# Patient Record
Sex: Female | Born: 1950 | Race: Black or African American | Hispanic: No | Smoking: Current every day smoker
Health system: Southern US, Community
[De-identification: ages and names within clinical notes are randomized; demographics above are authoritative.]

## PROBLEM LIST (undated history)

## (undated) DIAGNOSIS — I739 Peripheral vascular disease, unspecified: Secondary | ICD-10-CM

## (undated) DIAGNOSIS — I10 Essential (primary) hypertension: Secondary | ICD-10-CM

## (undated) DIAGNOSIS — E785 Hyperlipidemia, unspecified: Secondary | ICD-10-CM

## (undated) DIAGNOSIS — B351 Tinea unguium: Secondary | ICD-10-CM

## (undated) DIAGNOSIS — E041 Nontoxic single thyroid nodule: Secondary | ICD-10-CM

## (undated) DIAGNOSIS — I6529 Occlusion and stenosis of unspecified carotid artery: Secondary | ICD-10-CM

## (undated) DIAGNOSIS — E119 Type 2 diabetes mellitus without complications: Secondary | ICD-10-CM

## (undated) DIAGNOSIS — I2510 Atherosclerotic heart disease of native coronary artery without angina pectoris: Secondary | ICD-10-CM

## (undated) DIAGNOSIS — D649 Anemia, unspecified: Secondary | ICD-10-CM

## (undated) DIAGNOSIS — M1990 Unspecified osteoarthritis, unspecified site: Secondary | ICD-10-CM

## (undated) DIAGNOSIS — G4730 Sleep apnea, unspecified: Secondary | ICD-10-CM

## (undated) DIAGNOSIS — Z5189 Encounter for other specified aftercare: Secondary | ICD-10-CM

## (undated) HISTORY — DX: Anemia, unspecified: D64.9

## (undated) HISTORY — DX: Essential (primary) hypertension: I10

## (undated) HISTORY — PX: CARDIAC CATHETERIZATION: SHX172

## (undated) HISTORY — DX: Tinea unguium: B35.1

## (undated) HISTORY — PX: TONSILLECTOMY: SUR1361

## (undated) HISTORY — PX: ABDOMINAL HYSTERECTOMY: SHX81

## (undated) HISTORY — DX: Atherosclerotic heart disease of native coronary artery without angina pectoris: I25.10

## (undated) HISTORY — PX: JOINT REPLACEMENT: SHX530

## (undated) HISTORY — PX: BACK SURGERY: SHX140

## (undated) HISTORY — DX: Type 2 diabetes mellitus without complications: E11.9

## (undated) HISTORY — DX: Hyperlipidemia, unspecified: E78.5

## (undated) HISTORY — DX: Encounter for other specified aftercare: Z51.89

---

## 1998-02-26 HISTORY — PX: TOTAL ABDOMINAL HYSTERECTOMY W/ BILATERAL SALPINGOOPHORECTOMY: SHX83

## 1998-03-03 ENCOUNTER — Encounter: Admission: RE | Admit: 1998-03-03 | Discharge: 1998-06-01 | Payer: Self-pay | Admitting: *Deleted

## 1998-06-21 ENCOUNTER — Ambulatory Visit (HOSPITAL_COMMUNITY): Admission: RE | Admit: 1998-06-21 | Discharge: 1998-06-21 | Payer: Self-pay

## 1998-08-03 ENCOUNTER — Ambulatory Visit (HOSPITAL_COMMUNITY): Admission: RE | Admit: 1998-08-03 | Discharge: 1998-08-03 | Payer: Self-pay | Admitting: *Deleted

## 1998-10-27 ENCOUNTER — Encounter (INDEPENDENT_AMBULATORY_CARE_PROVIDER_SITE_OTHER): Payer: Self-pay | Admitting: Specialist

## 1998-10-27 ENCOUNTER — Other Ambulatory Visit: Admission: RE | Admit: 1998-10-27 | Discharge: 1998-10-27 | Payer: Self-pay | Admitting: Obstetrics and Gynecology

## 1999-01-09 ENCOUNTER — Encounter (INDEPENDENT_AMBULATORY_CARE_PROVIDER_SITE_OTHER): Payer: Self-pay | Admitting: Specialist

## 1999-01-09 ENCOUNTER — Inpatient Hospital Stay (HOSPITAL_COMMUNITY): Admission: RE | Admit: 1999-01-09 | Discharge: 1999-01-11 | Payer: Self-pay | Admitting: Obstetrics and Gynecology

## 2000-06-11 ENCOUNTER — Ambulatory Visit (HOSPITAL_COMMUNITY): Admission: RE | Admit: 2000-06-11 | Discharge: 2000-06-11 | Payer: Self-pay | Admitting: Family Medicine

## 2001-12-23 ENCOUNTER — Ambulatory Visit (HOSPITAL_COMMUNITY): Admission: RE | Admit: 2001-12-23 | Discharge: 2001-12-23 | Payer: Self-pay | Admitting: Family Medicine

## 2002-02-26 HISTORY — PX: OTHER SURGICAL HISTORY: SHX169

## 2003-06-10 ENCOUNTER — Encounter: Admission: RE | Admit: 2003-06-10 | Discharge: 2003-06-10 | Payer: Self-pay | Admitting: Family Medicine

## 2003-11-15 ENCOUNTER — Ambulatory Visit: Payer: Self-pay | Admitting: Family Medicine

## 2003-11-15 ENCOUNTER — Ambulatory Visit: Payer: Self-pay | Admitting: *Deleted

## 2003-12-06 ENCOUNTER — Ambulatory Visit: Payer: Self-pay | Admitting: Family Medicine

## 2004-03-10 ENCOUNTER — Ambulatory Visit: Payer: Self-pay | Admitting: Family Medicine

## 2004-03-10 ENCOUNTER — Ambulatory Visit (HOSPITAL_COMMUNITY): Admission: RE | Admit: 2004-03-10 | Discharge: 2004-03-10 | Payer: Self-pay | Admitting: Family Medicine

## 2004-07-14 ENCOUNTER — Ambulatory Visit: Payer: Self-pay | Admitting: Family Medicine

## 2004-07-14 ENCOUNTER — Ambulatory Visit (HOSPITAL_COMMUNITY): Admission: RE | Admit: 2004-07-14 | Discharge: 2004-07-14 | Payer: Self-pay | Admitting: Family Medicine

## 2004-07-28 ENCOUNTER — Ambulatory Visit (HOSPITAL_COMMUNITY): Admission: RE | Admit: 2004-07-28 | Discharge: 2004-07-28 | Payer: Self-pay | Admitting: Family Medicine

## 2004-08-14 ENCOUNTER — Ambulatory Visit: Payer: Self-pay | Admitting: Family Medicine

## 2004-08-24 ENCOUNTER — Ambulatory Visit: Payer: Self-pay | Admitting: Family Medicine

## 2005-01-11 ENCOUNTER — Ambulatory Visit: Payer: Self-pay | Admitting: Family Medicine

## 2005-01-12 ENCOUNTER — Ambulatory Visit (HOSPITAL_COMMUNITY): Admission: RE | Admit: 2005-01-12 | Discharge: 2005-01-12 | Payer: Self-pay | Admitting: Family Medicine

## 2005-02-13 ENCOUNTER — Ambulatory Visit: Payer: Self-pay | Admitting: Family Medicine

## 2005-03-09 ENCOUNTER — Ambulatory Visit: Payer: Self-pay | Admitting: Family Medicine

## 2005-04-10 ENCOUNTER — Ambulatory Visit: Payer: Self-pay | Admitting: Family Medicine

## 2005-05-16 ENCOUNTER — Ambulatory Visit: Payer: Self-pay | Admitting: Family Medicine

## 2005-07-13 ENCOUNTER — Ambulatory Visit: Payer: Self-pay | Admitting: Family Medicine

## 2005-10-23 ENCOUNTER — Ambulatory Visit: Payer: Self-pay | Admitting: Family Medicine

## 2005-10-25 ENCOUNTER — Ambulatory Visit (HOSPITAL_COMMUNITY): Admission: RE | Admit: 2005-10-25 | Discharge: 2005-10-25 | Payer: Self-pay | Admitting: Family Medicine

## 2006-02-26 HISTORY — PX: VARICOSE VEIN SURGERY: SHX832

## 2006-11-04 ENCOUNTER — Ambulatory Visit (HOSPITAL_COMMUNITY): Admission: RE | Admit: 2006-11-04 | Discharge: 2006-11-04 | Payer: Self-pay | Admitting: Internal Medicine

## 2006-11-13 ENCOUNTER — Encounter (INDEPENDENT_AMBULATORY_CARE_PROVIDER_SITE_OTHER): Payer: Self-pay | Admitting: *Deleted

## 2007-04-16 ENCOUNTER — Encounter: Payer: Self-pay | Admitting: Family Medicine

## 2007-06-27 LAB — HM COLONOSCOPY

## 2007-11-12 ENCOUNTER — Ambulatory Visit (HOSPITAL_COMMUNITY): Admission: RE | Admit: 2007-11-12 | Discharge: 2007-11-12 | Payer: Self-pay | Admitting: Internal Medicine

## 2007-12-11 ENCOUNTER — Encounter: Admission: RE | Admit: 2007-12-11 | Discharge: 2007-12-11 | Payer: Self-pay | Admitting: Internal Medicine

## 2008-02-27 HISTORY — PX: EPIDURAL BLOCK INJECTION: SHX1516

## 2008-04-02 ENCOUNTER — Ambulatory Visit: Payer: Self-pay | Admitting: Family Medicine

## 2008-04-02 DIAGNOSIS — E785 Hyperlipidemia, unspecified: Secondary | ICD-10-CM

## 2008-04-02 DIAGNOSIS — E1169 Type 2 diabetes mellitus with other specified complication: Secondary | ICD-10-CM | POA: Insufficient documentation

## 2008-04-02 DIAGNOSIS — M5137 Other intervertebral disc degeneration, lumbosacral region: Secondary | ICD-10-CM | POA: Insufficient documentation

## 2008-04-02 DIAGNOSIS — E1142 Type 2 diabetes mellitus with diabetic polyneuropathy: Secondary | ICD-10-CM | POA: Insufficient documentation

## 2008-04-02 DIAGNOSIS — I10 Essential (primary) hypertension: Secondary | ICD-10-CM | POA: Insufficient documentation

## 2008-04-02 DIAGNOSIS — M51379 Other intervertebral disc degeneration, lumbosacral region without mention of lumbar back pain or lower extremity pain: Secondary | ICD-10-CM | POA: Insufficient documentation

## 2008-04-05 LAB — CONVERTED CEMR LAB
Albumin: 4 g/dL (ref 3.5–5.2)
BUN: 19 mg/dL (ref 6–23)
CO2: 23 meq/L (ref 19–32)
Calcium: 9.7 mg/dL (ref 8.4–10.5)
Chloride: 108 meq/L (ref 96–112)
Cholesterol: 194 mg/dL (ref 0–200)
Eosinophils Absolute: 0.1 10*3/uL (ref 0.0–0.7)
Glucose, Bld: 100 mg/dL — ABNORMAL HIGH (ref 70–99)
HDL: 73 mg/dL (ref >39)
Lymphs Abs: 2.8 10*3/uL (ref 0.7–4.0)
MCV: 89.4 fL (ref 78.0–100.0)
Microalb, Ur: 5.78 mg/dL — ABNORMAL HIGH (ref 0.00–1.89)
Monocytes Relative: 9 % (ref 3–12)
Neutrophils Relative %: 64 % (ref 43–77)
Potassium: 4.1 meq/L (ref 3.5–5.3)
RBC: 4.26 M/uL (ref 3.87–5.11)
Triglycerides: 49 mg/dL (ref <150)
WBC: 10.7 10*3/uL — ABNORMAL HIGH (ref 4.0–10.5)

## 2008-04-22 ENCOUNTER — Encounter (INDEPENDENT_AMBULATORY_CARE_PROVIDER_SITE_OTHER): Payer: Self-pay | Admitting: *Deleted

## 2008-05-14 ENCOUNTER — Ambulatory Visit: Payer: Self-pay | Admitting: Family Medicine

## 2008-06-28 ENCOUNTER — Ambulatory Visit: Payer: Self-pay | Admitting: Family Medicine

## 2008-06-28 LAB — CONVERTED CEMR LAB: Hgb A1c MFr Bld: 5.6 % (ref 4.6–6.5)

## 2008-07-06 ENCOUNTER — Ambulatory Visit: Payer: Self-pay | Admitting: Family Medicine

## 2008-07-19 ENCOUNTER — Ambulatory Visit: Payer: Self-pay | Admitting: Family Medicine

## 2008-07-22 LAB — CONVERTED CEMR LAB
ALT: 12 units/L (ref 0–35)
Albumin: 3.6 g/dL (ref 3.5–5.2)
Alkaline Phosphatase: 64 units/L (ref 39–117)
CO2: 26 meq/L (ref 19–32)
Calcium: 9.2 mg/dL (ref 8.4–10.5)
Chloride: 108 meq/L (ref 96–112)
Creatinine, Ser: 1.2 mg/dL (ref 0.4–1.2)
Glucose, Bld: 88 mg/dL (ref 70–99)
HDL: 46.7 mg/dL (ref >39.00)
Total Protein: 7.3 g/dL (ref 6.0–8.3)
Triglycerides: 77 mg/dL (ref 0.0–149.0)

## 2008-10-04 ENCOUNTER — Ambulatory Visit: Payer: Self-pay | Admitting: Family Medicine

## 2008-10-04 LAB — CONVERTED CEMR LAB
ALT: 19 units/L (ref 0–35)
BUN: 24 mg/dL — ABNORMAL HIGH (ref 6–23)
Chloride: 113 meq/L — ABNORMAL HIGH (ref 96–112)
GFR calc non Af Amer: 83.19 mL/min (ref >60)
HDL: 56.2 mg/dL (ref >39.00)
Hgb A1c MFr Bld: 5.8 % (ref 4.6–6.5)
Potassium: 4.6 meq/L (ref 3.5–5.1)
Sodium: 145 meq/L (ref 135–145)
Triglycerides: 53 mg/dL (ref 0.0–149.0)

## 2008-10-22 ENCOUNTER — Ambulatory Visit: Payer: Self-pay | Admitting: Family Medicine

## 2008-10-22 LAB — CONVERTED CEMR LAB
HDL goal, serum: 40 mg/dL
LDL Goal: 100 mg/dL

## 2008-11-15 ENCOUNTER — Ambulatory Visit (HOSPITAL_COMMUNITY): Admission: RE | Admit: 2008-11-15 | Discharge: 2008-11-15 | Payer: Self-pay | Admitting: Family Medicine

## 2008-11-17 ENCOUNTER — Encounter (INDEPENDENT_AMBULATORY_CARE_PROVIDER_SITE_OTHER): Payer: Self-pay | Admitting: *Deleted

## 2009-01-17 ENCOUNTER — Ambulatory Visit: Payer: Self-pay | Admitting: Family Medicine

## 2009-01-18 LAB — CONVERTED CEMR LAB
Alkaline Phosphatase: 53 units/L (ref 39–117)
Bilirubin, Direct: 0.1 mg/dL (ref 0.0–0.3)
CO2: 27 meq/L (ref 19–32)
Calcium: 8.6 mg/dL (ref 8.4–10.5)
Creatinine, Ser: 1.1 mg/dL (ref 0.4–1.2)
GFR calc non Af Amer: 65.93 mL/min (ref >60)
Sodium: 142 meq/L (ref 135–145)
Total Bilirubin: 0.8 mg/dL (ref 0.3–1.2)
Total Protein: 6.9 g/dL (ref 6.0–8.3)

## 2009-01-28 ENCOUNTER — Ambulatory Visit: Payer: Self-pay | Admitting: Family Medicine

## 2009-04-25 ENCOUNTER — Ambulatory Visit: Payer: Self-pay | Admitting: Family Medicine

## 2009-04-25 LAB — CONVERTED CEMR LAB: Rapid Strep: NEGATIVE

## 2009-04-28 ENCOUNTER — Telehealth: Payer: Self-pay | Admitting: Family Medicine

## 2009-04-28 LAB — CONVERTED CEMR LAB
Basophils Relative: 1.4 % (ref 0.0–3.0)
Eosinophils Relative: 3.5 % (ref 0.0–5.0)
HCT: 32.7 % — ABNORMAL LOW (ref 36.0–46.0)
Hemoglobin: 11 g/dL — ABNORMAL LOW (ref 12.0–15.0)
Lymphs Abs: 2.2 10*3/uL (ref 0.7–4.0)
MCV: 96.1 fL (ref 78.0–100.0)
Monocytes Absolute: 0.4 10*3/uL (ref 0.1–1.0)
Monocytes Relative: 6.4 % (ref 3.0–12.0)
RBC: 3.41 M/uL — ABNORMAL LOW (ref 3.87–5.11)
Sed Rate: 29 mm/hr — ABNORMAL HIGH (ref 0–22)
WBC: 7 10*3/uL (ref 4.5–10.5)

## 2009-04-29 ENCOUNTER — Encounter: Payer: Self-pay | Admitting: Family Medicine

## 2009-06-26 LAB — HM DIABETES EYE EXAM

## 2009-07-04 ENCOUNTER — Encounter: Payer: Self-pay | Admitting: Family Medicine

## 2009-08-09 ENCOUNTER — Encounter (INDEPENDENT_AMBULATORY_CARE_PROVIDER_SITE_OTHER): Payer: Self-pay | Admitting: *Deleted

## 2009-08-09 ENCOUNTER — Ambulatory Visit: Payer: Self-pay | Admitting: Family Medicine

## 2009-08-09 LAB — CONVERTED CEMR LAB
AST: 19 units/L (ref 0–37)
Albumin: 4.1 g/dL (ref 3.5–5.2)
Alkaline Phosphatase: 54 units/L (ref 39–117)
BUN: 30 mg/dL — ABNORMAL HIGH (ref 6–23)
Calcium: 9.6 mg/dL (ref 8.4–10.5)
GFR calc non Af Amer: 63.14 mL/min (ref >60)
Glucose, Bld: 81 mg/dL (ref 70–99)
HDL: 56 mg/dL (ref >39.00)
Hgb A1c MFr Bld: 5.7 % (ref 4.6–6.5)
LDL Cholesterol: 73 mg/dL (ref 0–99)
Microalb Creat Ratio: 3.1 mg/g (ref 0.0–30.0)
Microalb, Ur: 6 mg/dL — ABNORMAL HIGH (ref 0.0–1.9)
Potassium: 4.8 meq/L (ref 3.5–5.1)
Sodium: 140 meq/L (ref 135–145)
Total Bilirubin: 0.7 mg/dL (ref 0.3–1.2)
VLDL: 10.8 mg/dL (ref 0.0–40.0)

## 2009-08-12 ENCOUNTER — Ambulatory Visit: Payer: Self-pay | Admitting: Family Medicine

## 2009-08-22 LAB — CONVERTED CEMR LAB
Basophils Absolute: 0 10*3/uL (ref 0.0–0.1)
Eosinophils Absolute: 0.3 10*3/uL (ref 0.0–0.7)
Folate: 3.4 ng/mL
Iron: 91 ug/dL (ref 42–145)
Lymphocytes Relative: 35.6 % (ref 12.0–46.0)
MCHC: 35.2 g/dL (ref 30.0–36.0)
Monocytes Relative: 6.4 % (ref 3.0–12.0)
Neutrophils Relative %: 54.4 % (ref 43.0–77.0)
RDW: 13.5 % (ref 11.5–14.6)
TSH: 0.5 microintl units/mL (ref 0.35–5.50)
Transferrin: 268.2 mg/dL (ref 212.0–360.0)
Vitamin B-12: 792 pg/mL (ref 211–911)

## 2009-11-17 ENCOUNTER — Ambulatory Visit (HOSPITAL_COMMUNITY): Admission: RE | Admit: 2009-11-17 | Discharge: 2009-11-17 | Payer: Self-pay | Admitting: Family Medicine

## 2009-11-17 LAB — HM MAMMOGRAPHY: HM Mammogram: NEGATIVE

## 2009-11-21 ENCOUNTER — Telehealth: Payer: Self-pay | Admitting: Family Medicine

## 2010-01-30 ENCOUNTER — Ambulatory Visit: Payer: Self-pay | Admitting: Family Medicine

## 2010-01-31 LAB — CONVERTED CEMR LAB
ALT: 15 units/L (ref 0–35)
AST: 24 units/L (ref 0–37)
Albumin: 3.9 g/dL (ref 3.5–5.2)
Hgb A1c MFr Bld: 5.8 % (ref 4.6–6.5)
Total Protein: 7 g/dL (ref 6.0–8.3)

## 2010-02-03 ENCOUNTER — Ambulatory Visit: Payer: Self-pay | Admitting: Family Medicine

## 2010-02-03 LAB — HM DIABETES FOOT EXAM

## 2010-02-20 ENCOUNTER — Emergency Department (HOSPITAL_COMMUNITY)
Admission: EM | Admit: 2010-02-20 | Discharge: 2010-02-20 | Payer: Self-pay | Source: Home / Self Care | Admitting: Emergency Medicine

## 2010-03-27 ENCOUNTER — Telehealth: Payer: Self-pay | Admitting: Family Medicine

## 2010-03-28 NOTE — Letter (Signed)
Summary: April Shaffer OD  April Shaffer OD   Imported By: Lanelle Bal 07/22/2009 11:23:55  _____________________________________________________________________  External Attachment:    Type:   Image     Comment:   External Document

## 2010-03-28 NOTE — Letter (Signed)
Summary: Work Dietitian at [facility] [employer]  [address] [place], [place] [address]   Phone: [phone]  Fax: [phone]    Today's Date: August 09, 2009  Name of Patient: April Shaffer  The above named patient had a medical visit today at: 8:20 am  Please take this into consideration when reviewing the time away from work/school.    Special Instructions:  [ x ] None  [  ] To be off the remainder of today, returning to the normal work / school schedule tomorrow.  [  ] To be off until the next scheduled appointment on ______________________.  [  ] Other ________________________________________________________________ ________________________________________________________________________   Sincerely yours,   Kerby Nora MD

## 2010-03-28 NOTE — Assessment & Plan Note (Signed)
Summary: ROA 6 MTHS  CYD   Vital Signs:  Patient profile:   60 year old female Height:      64 inches Weight:      139.6 pounds BMI:     24.05 Temp:     98.6 degrees F oral Pulse rate:   76 / minute Pulse rhythm:   regular BP sitting:   130 / 80  (left arm) Cuff size:   regular  Vitals Entered By: Benny Lennert CMA Duncan Dull) (August 12, 2009 12:06 PM)  History of Present Illness: Chief complaint 6 month follow up  Arthritis in hands...NSAIDs irritate her stomach soewhat...aleve not helping...would like to use topical treatment if able to.   Hypertension History:      She denies headache, chest pain, dyspnea with exertion, neurologic problems, and side effects from treatment.  Well controlled sat home .        Positive major cardiovascular risk factors include female age 55 years old or older, diabetes, hyperlipidemia, hypertension, and current tobacco user.    Lipid Management History:      Positive NCEP/ATP III risk factors include female age 7 years old or older, diabetes, current tobacco user, and hypertension.        Her compliance with the TLC diet is good.  Adjunctive measures started by the patient include aerobic exercise, omega-3 supplements, and weight reduction.       Problems Prior to Update: 1)  Sore Throat  (ICD-462) 2)  Facial Pain  (ICD-784.0) 3)  Preventive Health Care  (ICD-V70.0) 4)  Hyperlipidemia  (ICD-272.4) 5)  Degenerative Disc Disease, Lumbosacral Spine  (ICD-722.52) 6)  Hypertension  (ICD-401.9) 7)  Diabetes Mellitus, Type II  (ICD-250.00)  Current Medications (verified): 1)  Metformin Hcl 850 Mg Tabs (Metformin Hcl) .Marland Kitchen.. 1 Tab By Mouth Two Times A Day 2)  Lisinopril-Hydrochlorothiazide 20-12.5 Mg Tabs (Lisinopril-Hydrochlorothiazide) .... 2 Tab By Mouth Daily 3)  Ibuprofen 800 Mg Tabs (Ibuprofen) .Marland Kitchen.. 1 By Mouth Three Times A Day As Needed 4)  Crestor 10 Mg Tabs (Rosuvastatin Calcium) .... Take 1 Tablet By Mouth Once A Day 5)  Aleve 220 Mg Tabs  (Naproxen Sodium) .... Otc As Directed. 6)  Flonase 50 Mcg/act Susp (Fluticasone Propionate) .... 2 Sprays in Each Nostril Once Daily For 2 Weeks 7)  Lastacaft 0.25 % Soln (Alcaftadine) .... Daily 8)  Vitamin B-12 500 Mcg Tabs (Cyanocobalamin) .... One Tablet Daily 9)  Vitamin D3 5000 Unit/ml Liqd (Cholecalciferol) .... One Tablet Daily 10)  Ferrous Sulfate 325 (65 Fe) Mg Tabs (Ferrous Sulfate) .... One Tablet Daily  Allergies (verified): No Known Drug Allergies  Past History:  Past medical, surgical, family and social histories (including risk factors) reviewed, and no changes noted (except as noted below).  Past Medical History: Reviewed history from 04/02/2008 and no changes required. Diabetes mellitus, type II Hypertension Hyperlipidemia  Past Surgical History: Reviewed history from 04/02/2008 and no changes required. varicose vein stripping 2008 epidurals 02/2008 Dr. Dorthula Nettles total hysterectomy 2004 for mennorhagia  Family History: Reviewed history from 04/02/2008 and no changes required. father: ? health problems, likely DM mother: ? cancer MGM: lung cancer sister: HTN, hypothyroid brother: healthy  Social History: Reviewed history from 04/02/2008 and no changes required. Occupation: housekeeping, Primary school teacher Single 2 boys: healthy Current Smoker: 1 pack every 2-3 days Alcohol use-yes, 2 drinks a day Drug use-no Regular exercise-only at work, walking Diet: diabetic diet  Review of Systems       Hands ache, numbness at night last  year had nerve conduction...showed carpal tunnel syndrome. General:  Complains of fatigue. CV:  Denies chest pain or discomfort. Resp:  Denies shortness of breath. GI:  Denies abdominal pain. GU:  Denies dysuria.  Physical Exam  General:  Well-developed,well-nourished,in no acute distress; alert,appropriate and cooperative throughout examination Mouth:  some teeth remaining - no inflammation or lesion  seen throat clear  Neck:  No deformities, masses, or tenderness noted. Lungs:  Normal respiratory effort, chest expands symmetrically. Lungs are clear to auscultation, no crackles or wheezes. Heart:  Normal rate and regular rhythm. S1 and S2 normal without gallop, murmur, click, rub or other extra sounds. Abdomen:  soft, non-tender, no hepatomegaly, and no splenomegaly.   Pulses:  R and L posterior tibial pulses are full and equal bilaterally  Extremities:  no edema , B varicosities  Diabetes Management Exam:    Foot Exam (with socks and/or shoes not present):       Sensory-Pinprick/Light touch:          Left medial foot (L-4): normal          Left dorsal foot (L-5): normal          Left lateral foot (S-1): normal          Right medial foot (L-4): normal          Right dorsal foot (L-5): normal          Right lateral foot (S-1): normal       Sensory-Monofilament:          Left foot: normal          Right foot: normal       Inspection:          Left foot: normal          Right foot: normal       Nails:          Left foot: normal          Right foot: normal    Eye Exam:       Eye Exam done elsewhere          Date: 06/26/2009          Results: no DR          Done by: eye MD    Impression & Recommendations:  Problem # 1:  FATIGUE (ICD-780.79) Eval for cause.  Orders: TLB-TSH (Thyroid Stimulating Hormone) (84443-TSH) TLB-CBC Platelet - w/Differential (85025-CBCD) TLB-B12 + Folate Pnl (34742_59563-O75/IEP)  Problem # 2:  HYPERLIPIDEMIA (ICD-272.4)  Well controlled, very stable on current med.. Continue current medication. Recehck yearly, LFTs in 6 moths.  Her updated medication list for this problem includes:    Crestor 10 Mg Tabs (Rosuvastatin calcium) .Marland Kitchen... Take 1 tablet by mouth once a day  Labs Reviewed: SGOT: 19 (08/09/2009)   SGPT: 13 (08/09/2009)  Lipid Goals: Chol Goal: 200 (10/22/2008)   HDL Goal: 40 (10/22/2008)   LDL Goal: 100 (10/22/2008)   TG Goal: 150  (10/22/2008)  Prior 10 Yr Risk Heart Disease: 15 % (01/28/2009)   HDL:56.00 (08/09/2009), 56.20 (10/04/2008)  LDL:73 (08/09/2009), 68 (10/04/2008)  Chol:140 (08/09/2009), 135 (10/04/2008)  Trig:54.0 (08/09/2009), 53.0 (10/04/2008)  Problem # 3:  UNSPECIFIED ANEMIA (ICD-285.9) On daily iron..Pt already notified per report.  to start when Hg found low in 03/2009..check levels given continued fatigue.  Her updated medication list for this problem includes:    Vitamin B-12 500 Mcg Tabs (Cyanocobalamin) ..... One tablet daily    Ferrous Sulfate 325 (  65 Fe) Mg Tabs (Ferrous sulfate) ..... One tablet daily  Orders: TLB-IBC Pnl (Iron/FE;Transferrin) (83550-IBC) TLB-Transferrin (84466-TRNSF) TLB-Ferritin (82728-FER)  Problem # 4:  HYPERTENSION (ICD-401.9) Well controlled. Continue current medication.  Her updated medication list for this problem includes:    Lisinopril-hydrochlorothiazide 20-12.5 Mg Tabs (Lisinopril-hydrochlorothiazide) .Marland Kitchen... 2 tab by mouth daily  BP today: 130/80 Prior BP: 144/66 (04/25/2009)  Prior 10 Yr Risk Heart Disease: 15 % (01/28/2009)  Labs Reviewed: K+: 4.8 (08/09/2009) Creat: : 1.1 (08/09/2009)   Chol: 140 (08/09/2009)   HDL: 56.00 (08/09/2009)   LDL: 73 (08/09/2009)   TG: 54.0 (08/09/2009)  Problem # 5:  DIABETES MELLITUS, TYPE II (ICD-250.00)  Well controlled. Continue current medication.  Her updated medication list for this problem includes:    Metformin Hcl 850 Mg Tabs (Metformin hcl) .Marland Kitchen... 1 tab by mouth two times a day    Lisinopril-hydrochlorothiazide 20-12.5 Mg Tabs (Lisinopril-hydrochlorothiazide) .Marland Kitchen... 2 tab by mouth daily  Labs Reviewed: Creat: 1.1 (08/09/2009)     Last Eye Exam: no DR (06/26/2009) Reviewed HgBA1c results: 5.7 (08/09/2009)  5.8 (01/17/2009)  Complete Medication List: 1)  Metformin Hcl 850 Mg Tabs (Metformin hcl) .Marland Kitchen.. 1 tab by mouth two times a day 2)  Lisinopril-hydrochlorothiazide 20-12.5 Mg Tabs  (Lisinopril-hydrochlorothiazide) .... 2 tab by mouth daily 3)  Ibuprofen 800 Mg Tabs (Ibuprofen) .Marland Kitchen.. 1 by mouth three times a day as needed 4)  Crestor 10 Mg Tabs (Rosuvastatin calcium) .... Take 1 tablet by mouth once a day 5)  Aleve 220 Mg Tabs (Naproxen sodium) .... Otc as directed. 6)  Flonase 50 Mcg/act Susp (Fluticasone propionate) .... 2 sprays in each nostril once daily for 2 weeks 7)  Lastacaft 0.25 % Soln (Alcaftadine) .... Daily 8)  Vitamin B-12 500 Mcg Tabs (Cyanocobalamin) .... One tablet daily 9)  Vitamin D3 5000 Unit/ml Liqd (Cholecalciferol) .... One tablet daily 10)  Ferrous Sulfate 325 (65 Fe) Mg Tabs (Ferrous sulfate) .... One tablet daily 11)  Voltaren 1 % Gel (Diclofenac sodium) .... Apply to affected area two times a day  Hypertension Assessment/Plan:      The patient's hypertensive risk group is category C: Target organ damage and/or diabetes.  Her calculated 10 year risk of coronary heart disease is 15 %.  Today's blood pressure is 130/80.  Her blood pressure goal is < 130/80.  Lipid Assessment/Plan:      Based on NCEP/ATP III, the patient's risk factor category is "history of diabetes".  The patient's lipid goals are as follows: Total cholesterol goal is 200; LDL cholesterol goal is 100; HDL cholesterol goal is 40; Triglyceride goal is 150.  Her LDL cholesterol goal has been met.    Patient Instructions: 1)  Schedule CPX in 01/2010..labs prior Dx 272.0 2)  Hepatic Panel prior to visit ICD-9: 250.00 3)  HgBA1c prior to visit  ICD-9:  4)  Daily mulitvitamin...with iron. 5)  Calcium and vit D 600 mg/400IU two times a day  Prescriptions: VOLTAREN 1 % GEL (DICLOFENAC SODIUM) Apply to affected area two times a day  #1 tube x 3   Entered and Authorized by:   Kerby Nora MD   Signed by:   Kerby Nora MD on 08/12/2009   Method used:   Electronically to        [facility] (retail)       [address]       [place], [place]  [address]  Ph: [phone]       Fax: [phone]   RxID:   [id number]   Current Allergies (reviewed today): No known allergies

## 2010-03-28 NOTE — Progress Notes (Signed)
Summary: ???metformin  Phone Note Call from Patient Call back at Home Phone [phone] Call back at [phone]   Caller: Patient Call For: Kerby Nora MD Complaint: Urinary/GYN Problems Summary of Call: Patient went to pharmacy to pick up her rx for metformin and it was for 850 mg tabs and she has been taking 1000 mg tabs. I advised patient that metformin was changed to 850 mg on 01-28-2009. Patient says that she has been taking 1000 mg tabs since then and wants to know what she is supposed to be taking. Please advise.  Initial call taken by: Melody Comas,  November 21, 2009 3:33 PM  Follow-up for Phone Call        It is up to her...we changed it then in effort to decrease medicaiton....she may never have filled new dose....if blood sugars remain very well controlled she can go ahead and try lower dose. if she would feel more comfortable continuing at higher dose until next follow up she may do this as will...change EMR and refill med to follow her wishes. Follow-up by: Kerby Nora MD,  November 22, 2009 8:30 AM  Additional Follow-up for Phone Call Additional follow up Details #1::        Patient advised and she is going to take the 850mg  because that is what [facility] refilled Additional Follow-up by: Benny Lennert CMA Duncan Dull),  November 22, 2009 10:06 AM

## 2010-03-28 NOTE — Assessment & Plan Note (Signed)
Summary: CPX / LFW  R/S FROM 02/02/10   Vital Signs:  Patient profile:   60 year old female Height:      64 inches Weight:      141.8 pounds BMI:     24.43 Temp:     98.2 degrees F oral Pulse rate:   76 / minute Pulse rhythm:   regular BP sitting:   140 / 88  (left arm) Cuff size:   regular  Vitals Entered By: Benny Lennert CMA Duncan Dull) (February 03, 2010 11:21 AM)  History of Present Illness: Chief complaint cpx  The patient is here for annual wellness exam and preventative care.    She also has the following chronic and acute issues:  DM , well controlled on metformin.   Hypertension History:      She denies headache, chest pain, palpitations, dyspnea with exertion, peripheral edema, visual symptoms, and neurologic problems.  Does not check BP at home.Marland Kitchen at wellness 132/72.        Positive major cardiovascular risk factors include female age 49 years old or older, diabetes, hyperlipidemia, hypertension, and current tobacco user.    Lipid Management History:      Positive NCEP/ATP III risk factors include female age 19 years old or older, diabetes, current tobacco user, and hypertension.        Her compliance with the TLC diet is good.  The patient expresses understanding of adjunctive measures for cholesterol lowering.  Adjunctive measures started by the patient include aerobic exercise and fiber.  She expresses no side effects from her lipid-lowering medication.  The patient denies any symptoms to suggest myopathy or liver disease.  Comments include: Nml 6 months LFTS .  Comments: At goal last check. .    Problems Prior to Update: 1)  Unspecified Anemia  (ICD-285.9) 2)  Fatigue  (ICD-780.79) 3)  Sore Throat  (ICD-462) 4)  Facial Pain  (ICD-784.0) 5)  Preventive Health Care  (ICD-V70.0) 6)  Hyperlipidemia  (ICD-272.4) 7)  Degenerative Disc Disease, Lumbosacral Spine  (ICD-722.52) 8)  Hypertension  (ICD-401.9) 9)  Diabetes Mellitus, Type II  (ICD-250.00)  Current  Medications (verified): 1)  Metformin Hcl 850 Mg Tabs (Metformin Hcl) .Marland Kitchen.. 1 Tab By Mouth Two Times A Day 2)  Lisinopril-Hydrochlorothiazide 20-12.5 Mg Tabs (Lisinopril-Hydrochlorothiazide) .... 2 Tab By Mouth Daily 3)  Ibuprofen 800 Mg Tabs (Ibuprofen) .Marland Kitchen.. 1 By Mouth Three Times A Day As Needed 4)  Crestor 10 Mg Tabs (Rosuvastatin Calcium) .... Take 1 Tablet By Mouth Once A Day 5)  Aleve 220 Mg Tabs (Naproxen Sodium) .... Otc As Directed. 6)  Flonase 50 Mcg/act Susp (Fluticasone Propionate) .... 2 Sprays in Each Nostril Once Daily For 2 Weeks 7)  Lastacaft 0.25 % Soln (Alcaftadine) .... Daily 8)  Vitamin B-12 500 Mcg Tabs (Cyanocobalamin) .... One Tablet Daily 9)  Vitamin D3 5000 Unit/ml Liqd (Cholecalciferol) .... One Tablet Daily 10)  Ferrous Sulfate 325 (65 Fe) Mg Tabs (Ferrous Sulfate) .... One Tablet Daily 11)  Voltaren 1 % Gel (Diclofenac Sodium) .... Apply To Affected Area Two Times A Day  Allergies (verified): No Known Drug Allergies  Past History:  Past medical, surgical, family and social histories (including risk factors) reviewed, and no changes noted (except as noted below).  Past Medical History: Reviewed history from 04/02/2008 and no changes required. Diabetes mellitus, type II Hypertension Hyperlipidemia  Past Surgical History: Reviewed history from 04/02/2008 and no changes required. varicose vein stripping 2008 epidurals 02/2008 Dr. Dorthula Nettles total hysterectomy 2004  for mennorhagia  Family History: Reviewed history from 04/02/2008 and no changes required. father: ? health problems, likely DM mother: ? cancer MGM: lung cancer sister: HTN, hypothyroid brother: healthy  Social History: Reviewed history from 04/02/2008 and no changes required. 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 CV:  Denies chest pain or discomfort. Resp:  Denies shortness of breath. GI:  Denies abdominal pain, constipation, and diarrhea. GU:  Denies dysuria. Endo:  Complains of cold intolerance.  Physical Exam  General:  Well-developed,well-nourished,in no acute distress; alert,appropriate and cooperative throughout examination Eyes:  No corneal or conjunctival inflammation noted. EOMI. Perrla. Funduscopic exam benign, without hemorrhages, exudates or papilledema. Vision grossly normal. Ears:  External ear exam shows no significant lesions or deformities.  Otoscopic examination reveals clear canals, tympanic membranes are intact bilaterally without bulging, retraction, inflammation or discharge. Hearing is grossly normal bilaterally. Nose:  External nasal examination shows no deformity or inflammation. Nasal mucosa are pink and moist without lesions or exudates. Mouth:  Oral mucosa and oropharynx without lesions or exudates.  Teeth in good repair. Neck:  no carotid bruit or thyromegaly no cervical or supraclavicular lymphadenopathy  Chest Wall:  No deformities, masses, or tenderness noted. Breasts:  No mass, nodules, thickening, tenderness, bulging, retraction, inflamation, nipple discharge or skin changes noted.   Lungs:  Normal respiratory effort, chest expands symmetrically. Lungs are clear to auscultation, no crackles or wheezes. Heart:  Normal rate and regular rhythm. S1 and S2 normal without gallop, murmur, click, rub or other extra sounds. Abdomen:  Bowel sounds positive,abdomen soft and non-tender without masses, organomegaly or hernias noted. Genitalia:  not indicated Pulses:  R and L posterior tibial pulses are full and equal bilaterally  Extremities:  no edema   Diabetes Management Exam:    Foot Exam (with socks and/or shoes not present):       Sensory-Pinprick/Light touch:          Left medial foot (L-4): normal           Left dorsal foot (L-5): normal          Left lateral foot (S-1): normal          Right medial foot (L-4): normal          Right dorsal foot (L-5): normal          Right lateral foot (S-1): normal       Sensory-Monofilament:          Left foot: normal          Right foot: normal       Inspection:          Left foot: normal          Right foot: normal       Nails:          Left foot: normal          Right foot: normal   Impression & Recommendations:  Problem # 1:  PREVENTIVE HEALTH CARE (ICD-V70.0) The patient's preventative maintenance and recommended screening tests for an annual wellness exam were reviewed in full today. Brought up to date unless services declined.  Counselled on the importance of diet, exercise, and its role in overall health and mortality. The patient's FH and SH was reviewed, including their home life, tobacco  status, and drug and alcohol status.     Problem # 2:  HYPERTENSION (ICD-401.9) Unclear control... follow at home. Call with measurements.  Her updated medication list for this problem includes:    Lisinopril-hydrochlorothiazide 20-12.5 Mg Tabs (Lisinopril-hydrochlorothiazide) .Marland Kitchen... 2 tab by mouth daily  Problem # 3:  DIABETES MELLITUS, TYPE II (ICD-250.00)  Well controlled. Continue current medication.  Her updated medication list for this problem includes:    Metformin Hcl 850 Mg Tabs (Metformin hcl) .Marland Kitchen... 1 tab by mouth two times a day    Lisinopril-hydrochlorothiazide 20-12.5 Mg Tabs (Lisinopril-hydrochlorothiazide) .Marland Kitchen... 2 tab by mouth daily  Labs Reviewed: Creat: 1.1 (08/09/2009)     Last Eye Exam: no DR (06/26/2009) Reviewed HgBA1c results: 5.8 (01/30/2010)  5.7 (08/09/2009)  Complete Medication List: 1)  Metformin Hcl 850 Mg Tabs (Metformin hcl) .Marland Kitchen.. 1 tab by mouth two times a day 2)  Lisinopril-hydrochlorothiazide 20-12.5 Mg Tabs (Lisinopril-hydrochlorothiazide) .... 2 tab by mouth daily 3)  Ibuprofen 800 Mg Tabs (Ibuprofen)  .Marland Kitchen.. 1 by mouth three times a day as needed 4)  Crestor 10 Mg Tabs (Rosuvastatin calcium) .... Take 1 tablet by mouth once a day 5)  Aleve 220 Mg Tabs (Naproxen sodium) .... Otc as directed. 6)  Flonase 50 Mcg/act Susp (Fluticasone propionate) .... 2 sprays in each nostril once daily for 2 weeks 7)  Lastacaft 0.25 % Soln (Alcaftadine) .... Daily 8)  Vitamin B-12 500 Mcg Tabs (Cyanocobalamin) .... One tablet daily 9)  Vitamin D3 5000 Unit/ml Liqd (Cholecalciferol) .... One tablet daily 10)  Ferrous Sulfate 325 (65 Fe) Mg Tabs (Ferrous sulfate) .... One tablet daily 11)  Voltaren 1 % Gel (Diclofenac sodium) .... Apply to affected area two times a day  Other Orders: Pneumococcal Vaccine (35573) Admin 1st Vaccine (22025)  Hypertension Assessment/Plan:      The patient's hypertensive risk group is category C: Target organ damage and/or diabetes.  Her calculated 10 year risk of coronary heart disease is 20 %.  Today's blood pressure is 140/88.  Her blood pressure goal is < 130/80.  Lipid Assessment/Plan:      Based on NCEP/ATP III, the patient's risk factor category is "history of diabetes".  The patient's lipid goals are as follows: Total cholesterol goal is 200; LDL cholesterol goal is 100; HDL cholesterol goal is 40; Triglyceride goal is 150.  Her LDL cholesterol goal has been met.    Patient Instructions: 1)  Please schedule a follow-up appointment in 6 months .  2)  BMP prior to visit, ICD-9: 250.00 3)  Hepatic Panel prior to visit ICD-9:  4)  Lipid panel prior to visit ICD-9 :  5)  HgBA1c prior to visit  ICD-9:  6)  Urine Microalbumin prior to visit ICD-9 :  7)   Check blood pressure .. goal  < 130/80.Marland Kitchen call with results after 1-2 weeks.    Orders Added: 1)  Pneumococcal Vaccine [90732] 2)  Admin 1st Vaccine [90471] 3)  Est. Patient 40-64 years [99396]   Immunizations Administered:  Pneumonia Vaccine:    Vaccine Type: Pneumovax   Immunizations Administered:  Pneumonia  Vaccine:    Vaccine Type: Pneumovax  Current Allergies (reviewed today): No known allergies    Past Medical History:    Reviewed history from 04/02/2008 and no changes required:       Diabetes mellitus, type II       Hypertension       Hyperlipidemia  Past Surgical History:    Reviewed history from 04/02/2008 and  no changes required:       varicose vein stripping 2008       epidurals 02/2008 Dr. Dorthula Nettles       total hysterectomy 2004 for mennorhagia   Last Flu Vaccine:  given (11/26/2008 2:35:59 PM) Flu Vaccine Result Date:  02/03/2010 Flu Vaccine Result:  given Flu Vaccine Next Due:  1 yr  Appended Document: CPX / LFW  R/S FROM 02/02/10     Clinical Lists Changes  Orders: Added new Service order of Pneumococcal Vaccine ([address]) - Signed Added new Service order of Admin 1st Vaccine (60454) - Signed Observations: Added new observation of PNEUMOVAXVIS: 01/31/09 version given February 03, 2010. (02/03/2010 12:13) Added new observation of PNEUMOVAXLOT: 1297aa (02/03/2010 12:13) Added new observation of PNEUMOVAXEXP: 06/23/2011 (02/03/2010 12:13) Added new observation of PNEUMOVAXBY: Heather Woodard CMA (AAMA) (02/03/2010 12:13) Added new observation of PNEUMOVAXRTE: IM (02/03/2010 12:13) Added new observation of PNEUMOVAXDOS: 0.5 ml (02/03/2010 12:13) Added new observation of PNEUMOVAXMFR: Merck (02/03/2010 12:13) Added new observation of PNEUMOVAXSIT: left deltoid (02/03/2010 12:13) Added new observation of PNEUMOVAX: Pneumovax (02/03/2010 12:13)       Immunizations Administered:  Pneumonia Vaccine:    Vaccine Type: Pneumovax    Site: left deltoid    Mfr: Merck    Dose: 0.5 ml    Route: IM    Given by: Benny Lennert CMA (AAMA)    Exp. Date: 06/23/2011    Lot #: [id number]    VIS given: 01/31/09 version given February 03, 2010.

## 2010-03-28 NOTE — Progress Notes (Signed)
Summary: still has sore throat  Phone Note Call from Patient Call back at [phone]   Caller: Patient Call For: Dr. Milinda Antis Summary of Call: Patient says that she still has sore throat, no fever. She said that she has been staying cold. uses [facility] [address]  Initial call taken by: Melody Comas,  April 28, 2009 8:57 AM  Follow-up for Phone Call        rev labs- puzzling as to what is causing it -- is it more throat pain/ sore throat -- or mouth/ facial /ear pain like what she was having? has she noticed any rash ? Follow-up by: Judith Part MD,  April 28, 2009 9:09 AM  Additional Follow-up for Phone Call Additional follow up Details #1::        Spoke with pt, says she has a scratchy throat on the left side, slight nagging headache on left side.  No mouth, facial or ear pain, no rash.  in light of nl wbc (not likely bacterial infx) - I want to ref her to ENT for further eval - asap if possible  will do ref and route to [facility] -- [place]  Additional Follow-up by: Lowella Petties CMA,  April 28, 2009 9:20 AM  New Problems: SORE THROAT (ICD-462)   Additional Follow-up for Phone Call Additional follow up Details #2::    ENT appt made with [facility] on 04/29/2009 at 1:00pm with P.A. Aquilla Hacker. [id number]. Follow-up by: Carlton Adam,  April 28, 2009 10:03 AM  New Problems: SORE THROAT (ICD-462)

## 2010-03-28 NOTE — Consult Note (Signed)
Summary: [facility]  [facility]   Imported By: Lanelle Bal 05/06/2009 13:37:59  _____________________________________________________________________  External Attachment:    Type:   Image     Comment:   External Document

## 2010-03-28 NOTE — Assessment & Plan Note (Signed)
Summary: SORE THROAT, FEVER   Vital Signs:  Patient profile:   60 year old female Height:      64 inches Weight:      138.25 pounds BMI:     23.82 Temp:     98.4 degrees F oral Pulse rate:   76 / minute Pulse rhythm:   regular BP sitting:   144 / 66  (right arm) Cuff size:   regular  Vitals Entered By: Lewanda Rife LPN (April 25, 2009 9:44 AM)  History of Present Illness: glands are swollen and sore behind L ear -- is sore to the touch ear may hurt a bit  bottom gums hurt - no teeth there/ however  little aggrivating headache  no cold symptoms no fever   has been feeling feeling generally cold latley  does smoke lightly  no chewing tab or snuff  has some teeth and plate  Allergies (verified): No Known Drug Allergies  Past History:  Past Medical History: Last updated: 04/02/2008 Diabetes mellitus, type II Hypertension Hyperlipidemia  Past Surgical History: Last updated: 04/02/2008 varicose vein stripping 2008 epidurals 02/2008 Dr. Dorthula Nettles total hysterectomy 2004 for mennorhagia  Family History: Last updated: 04/02/2008 father: ? health problems, likely DM mother: ? cancer MGM: lung cancer sister: HTN, hypothyroid brother: healthy  Social History: Last updated: 04/02/2008 Occupation: housekeeping, Tree surgeon Retirement Single 2 boys: healthy Current Smoker: 1 pack every 2-3 days Alcohol use-yes, 2 drinks a day Drug use-no Regular exercise-only at work, walking Diet: diabetic diet  Risk Factors: Exercise: yes (04/02/2008)  Risk Factors: Smoking Status: current (04/02/2008)  Review of Systems General:  Denies chills, fatigue, fever, loss of appetite, and malaise. Eyes:  Denies blurring, discharge, and eye irritation. ENT:  Complains of earache; denies difficulty swallowing, ear discharge, nasal congestion, nosebleeds, ringing in ears, sinus pressure, and sore throat. CV:  Denies chest pain or discomfort and palpitations. Resp:   Denies cough and wheezing. GI:  Denies change in bowel habits, nausea, and vomiting. MS:  Denies joint pain. Derm:  Denies itching, lesion(s), poor wound healing, and rash. Neuro:  Complains of headaches; denies numbness and tingling. Heme:  Denies abnormal bruising and bleeding.  Physical Exam  General:  Well-developed,well-nourished,in no acute distress; alert,appropriate and cooperative throughout examination Head:  mild tenderness ant to and under ear  mild temporal and tmj tenderness no obvious swelling or skin change no sinus tenderness Eyes:  vision grossly intact, pupils equal, pupils round, and pupils reactive to light.  no conjunctival pallor, injection or icterus  Ears:  TMs slt dull -- ? very small eff on L  Nose:  boggy nares  Mouth:  some teeth remaining - no inflammation or lesion seen throat clear  Neck:  No deformities, masses, or tenderness noted. Chest Wall:  No deformities, masses, or tenderness noted. Lungs:  Normal respiratory effort, chest expands symmetrically. Lungs are clear to auscultation, no crackles or wheezes. Heart:  Normal rate and regular rhythm. S1 and S2 normal without gallop, murmur, click, rub or other extra sounds. Abdomen:  soft, non-tender, no hepatomegaly, and no splenomegaly.   Skin:  Intact without suspicious lesions or rashes Cervical Nodes:  No lymphadenopathy noted Inguinal Nodes:  No significant adenopathy Psych:  normal affect, talkative and pleasant    Impression & Recommendations:  Problem # 1:  FACIAL PAIN (ICD-784.0) Assessment New with some tenderness around ear/ temple and parotid  diff incl ETD, TA, TMJ, less likely zoster pre - rash/ parotiditis early adv to watch for rash/  suck on sugar free sour candies/ eat soft diet/ warm compress watch for fever or other symptoms  flonase once daily for poss etd  lab for esr and cbc today and update pt advised to update me if symptoms worsen or do not improve  Her updated  medication list for this problem includes:    Ibuprofen 800 Mg Tabs (Ibuprofen) .Marland Kitchen... 1 by mouth three times a day as needed    Aleve 220 Mg Tabs (Naproxen sodium) ..... Otc as directed.  Orders: Venipuncture (31517) TLB-CBC Platelet - w/Differential (85025-CBCD) TLB-Sedimentation Rate (ESR) (85652-ESR) Prescription Created Electronically [phone]) Rapid Strep (37106)  Complete Medication List: 1)  Metformin Hcl 850 Mg Tabs (Metformin hcl) .Marland Kitchen.. 1 tab by mouth two times a day 2)  Lisinopril-hydrochlorothiazide 20-12.5 Mg Tabs (Lisinopril-hydrochlorothiazide) .... 2 tab by mouth daily 3)  Ibuprofen 800 Mg Tabs (Ibuprofen) .Marland Kitchen.. 1 by mouth three times a day as needed 4)  Crestor 10 Mg Tabs (Rosuvastatin calcium) .... Take 1 tablet by mouth once a day 5)  Aleve 220 Mg Tabs (Naproxen sodium) .... Otc as directed. 6)  Flonase 50 Mcg/act Susp (Fluticasone propionate) .... 2 sprays in each nostril once daily for 2 weeks  Patient Instructions: 1)  labs today  2)  suck on some sugar free sour candies to get saliva flowing 3)  try flonase to open up your ear  4)  update me if rash or fever or worse pain  5)  do not chew gum or hard to chew foods until this feels better/ soft diet is best  Prescriptions: FLONASE 50 MCG/ACT SUSP (FLUTICASONE PROPIONATE) 2 sprays in each nostril once daily for 2 weeks  #1 mdi x 0   Entered and Authorized by:   Judith Part MD   Signed by:   Judith Part MD on 04/25/2009   Method used:   Electronically to        [facility] (retail)       [address]       [place], [place]  [address]       Ph: [phone]       Fax: [phone]   RxID:   [id number]   Current Allergies (reviewed today): No known allergies   Laboratory Results  Date/Time Received: 04/25/09 9:35am Date/Time Reported: 04/25/09 945am  Other Tests  Rapid Strep: negative  Kit Test Internal QC: Positive   (Normal Range: Negative)

## 2010-04-05 NOTE — Progress Notes (Signed)
Summary: pt stopped taking crestor  Phone Note Call from Patient Call back at Home Phone [phone] Call back at [phone]   Caller: Patient Call For: Kerby Nora MD Summary of Call: Pt states she has stopped taking crestor because it caused her to have dark circles under her eyes.  Since she stopped taking it the circles have gone away.  She is asking if you want her to try something else for her cholesterol. Initial call taken by: Lowella Petties CMA, AAMA,  March 27, 2010 2:53 PM  Follow-up for Phone Call        Replace with lipitor.. return for AST, ALT and lipids in 3 months,fasting Follow-up by: Kerby Nora MD,  March 27, 2010 3:45 PM  Additional Follow-up for Phone Call Additional follow up Details #1::        Patient advised.Consuello Masse CMA    She already has appt for labs Additional Follow-up by: Benny Lennert CMA Duncan Dull),  March 27, 2010 3:47 PM    New/Updated Medications: ATORVASTATIN CALCIUM 40 MG TABS (ATORVASTATIN CALCIUM) Take 1 tablet by mouth once a day Prescriptions: ATORVASTATIN CALCIUM 40 MG TABS (ATORVASTATIN CALCIUM) Take 1 tablet by mouth once a day  #30 x 5   Entered and Authorized by:   Kerby Nora MD   Signed by:   Kerby Nora MD on 03/27/2010   Method used:   Electronically to        [facility] (retail)       [address]       [place], [place]  [address]       Ph: [phone]       Fax: [phone]   RxID:   [id number]

## 2010-07-14 NOTE — Op Note (Signed)
[facility]  Patient:    April Shaffer                      MRN: [id number] Proc. Date: 01/09/99 Adm. Date:  [id number] Attending:  Lenoard Aden CC:         [facility]                           Operative Report  PREOPERATIVE DIAGNOSES:       Symptomatic uterine fibroids, endometrial mass, menometrorrhagia with secondary anemia.  POSTOPERATIVE DIAGNOSES:      Symptomatic uterine fibroids, endometrial mass, menometrorrhagia with secondary anemia.  OPERATION:                    Laparoscopic-assisted vaginal hysterectomy, bilateral salpingo-oophorectomy, McCall culdoplasty, lysis of adhesions.  SURGEON:                      Lenoard Aden, M.D.  ASSISTANT:                    Pershing Cox, M.D.  ANESTHESIA:                   Epidural by Raul Del, M.D.  ESTIMATED BLOOD LOSS:         About 100 cc.  COMPLICATIONS:                None.  DRAINS:                       None.  COUNTS:                       Correct.  DISPOSITION:                  Patient to recovery in good condition.  FINDINGS:                     Enlarged fibroid uterus.  Left adnexal adhesions nd normal ovaries.  CONSENT:                      After being apprised of anesthesia, infection, bleeding, injury to abdominal organs, the need for repair, postoperative complications to include possible bowel obstruction, the patient desires to proceed with surgery.  She desires ovarian removal.  DESCRIPTION OF PROCEDURE:     After being administered a general anesthetic and  placed in the dorsal lithotomy position, her feet were placed in the elephant stirrups.  The patient was prepared and draped in the usual sterile fashion and  attention is turned to the vagina where by examination under anesthesia reveals a bulky, midportion uterus of about 150 g to 200 g uterus.  No adnexal masses. Good vaginal relaxation is noted and Foley catheter is placed.  At  this time infraumbilical incision is made with a scalpel.  Veress needle is placed.  CO2 insufflated in the abdomen after opening pressure of 2 is noted.  The patient is morbidly obese.  ______ her CO2 insufflated without difficulty and long trocar placed.  Visualized reveals atraumatic entry and pictures are taken.  Liver, gallbladder bed appears normal.  No evidence of anterior abdominal wall adhesions. At this time bilateral lower trocar sites are placed, 12 mm trocar sites are placed without difficulty and the grasper was  used to pickup the left ovary and tube.  Multiple adhesions of the left sigmoid mesenteric are lysed sharply from the left ovary, left tube, and left ovarian and fossa.  These are lysed using EndoShears  with sharp dissection.  Ureters identified bilaterally.  Left infundibulopelvic  ligament is stapled and cut.  Progressive stapling lines are taken hugging closely to the left ovary and down to the area and just below the round ligament on the  left side.  Good hemostasis achieved.  The same procedure was done on the right  side after identifying the right ureter.  Progressive stapling bites taken down to the right round ligament.  The bladder flap was then developed sharply using EndoShears and attention was turned to the vagina whereby a weighted speculum was placed.  The cervix was grasped anteriorly and posteriorly using a Jacobs tenaculum.  At this time, the cervicovaginal junction was infiltrated with a dilute Pitressin solution and scored using the scalpel.  Posterior cul-de-sac entry was made with minimal difficulty and long weighted retractors placed.  The bladder lap is mobilized.  Anterior cul-de-sac entry is not made at this time. Uterosacrals are bilaterally clamped with Haney clamps and suture ligated and transfixed to he vagina.  Progressive bites of the cardinal ligament complexes are taken.  These are suture ligated using 0 Vicryl  sutures.  Uterine vessels are noted bilaterally and sutured after being clamped with Haney clamps.  Tuboovarian round ligament complexes are identified bilaterally after anterior cul-de-sac entry is made and peritoneum is grasped on both sides of the pedicle.  A long Deaver is placed into anterior cul-de-sac.  Specimen is then removed after clamping these tuboovarian  round stapled areas and these pedicles are bilateral tied.  The specimen is removed of both ovaries, tubes, uterus, and cervix.  McCall culdoplasty stitch placed using a 0 Vicryl suture.  Vagina then closed using 0 Vicryl interrupted sutures and uterosacrals are plicated in the midline.  Good hemostasis is achieved.  After pedicles had all been previously inspected then packing is placed.  Attention is turned to the abdominal portion of the procedure whereby all pedicles are visualized and pictures are taken.  No evidence of loose staples.  No evidence f adhesions to the vaginal cuff of lateral cuffs.  Urine output is noted to be normal.  All instruments are removed under direct visualized.  Incisions are closed using 0 Vicryl and 4-0 Vicryl.  Marcaine solution is placed.  The patient tolerated the procedure well and was transferred to recovery in good condition. DD:  01/09/99 TD:  01/11/99 Job: [id number]

## 2010-07-31 ENCOUNTER — Other Ambulatory Visit (INDEPENDENT_AMBULATORY_CARE_PROVIDER_SITE_OTHER): Payer: 59

## 2010-07-31 DIAGNOSIS — I10 Essential (primary) hypertension: Secondary | ICD-10-CM

## 2010-07-31 DIAGNOSIS — D649 Anemia, unspecified: Secondary | ICD-10-CM

## 2010-07-31 DIAGNOSIS — E119 Type 2 diabetes mellitus without complications: Secondary | ICD-10-CM

## 2010-07-31 DIAGNOSIS — E785 Hyperlipidemia, unspecified: Secondary | ICD-10-CM

## 2010-07-31 LAB — BASIC METABOLIC PANEL
CO2: 23 mEq/L (ref 19–32)
Calcium: 8.9 mg/dL (ref 8.4–10.5)
Creatinine, Ser: 1.4 mg/dL — ABNORMAL HIGH (ref 0.4–1.2)
GFR: 48.05 mL/min — ABNORMAL LOW (ref >60.00)

## 2010-07-31 LAB — MICROALBUMIN / CREATININE URINE RATIO: Microalb Creat Ratio: 4.1 mg/g (ref 0.0–30.0)

## 2010-07-31 LAB — LIPID PANEL
HDL: 61 mg/dL (ref >39.00)
Total CHOL/HDL Ratio: 2
Triglycerides: 106 mg/dL (ref 0.0–149.0)
VLDL: 21.2 mg/dL (ref 0.0–40.0)

## 2010-07-31 LAB — HEPATIC FUNCTION PANEL
Bilirubin, Direct: 0.2 mg/dL (ref 0.0–0.3)
Total Protein: 6.8 g/dL (ref 6.0–8.3)

## 2010-08-01 ENCOUNTER — Telehealth: Payer: Self-pay | Admitting: Family Medicine

## 2010-08-01 ENCOUNTER — Other Ambulatory Visit: Payer: Self-pay

## 2010-08-01 DIAGNOSIS — N179 Acute kidney failure, unspecified: Secondary | ICD-10-CM

## 2010-08-01 NOTE — Telephone Encounter (Signed)
Message copied by Excell Seltzer on Tue Aug 01, 2010  4:57 PM ------      Message from: Gilmer Mor      Created: Tue Aug 01, 2010  4:55 PM       Patient advised as instructed via telephone.  She stated that she has no N/V/D and the only medications that were changed was from Crestor to Lipitor.  She stated that the Crestor was making under her eyes swell.  She has been drinking plenty of water.

## 2010-08-01 NOTE — Telephone Encounter (Signed)
Have her retun in 2-3 days for repeat BMET.

## 2010-08-01 NOTE — Telephone Encounter (Signed)
Patient scheduled to have labs done on 08/04/2010.

## 2010-08-04 ENCOUNTER — Other Ambulatory Visit (INDEPENDENT_AMBULATORY_CARE_PROVIDER_SITE_OTHER): Payer: 59 | Admitting: Family Medicine

## 2010-08-04 DIAGNOSIS — N179 Acute kidney failure, unspecified: Secondary | ICD-10-CM

## 2010-08-04 LAB — BASIC METABOLIC PANEL
CO2: 23 mEq/L (ref 19–32)
Chloride: 110 mEq/L (ref 96–112)
Creatinine, Ser: 1.1 mg/dL (ref 0.4–1.2)
Sodium: 139 mEq/L (ref 135–145)

## 2010-08-05 ENCOUNTER — Encounter: Payer: Self-pay | Admitting: Family Medicine

## 2010-08-08 ENCOUNTER — Ambulatory Visit (INDEPENDENT_AMBULATORY_CARE_PROVIDER_SITE_OTHER): Payer: 59 | Admitting: Family Medicine

## 2010-08-08 ENCOUNTER — Encounter: Payer: Self-pay | Admitting: Family Medicine

## 2010-08-08 DIAGNOSIS — E119 Type 2 diabetes mellitus without complications: Secondary | ICD-10-CM

## 2010-08-08 DIAGNOSIS — E785 Hyperlipidemia, unspecified: Secondary | ICD-10-CM

## 2010-08-08 DIAGNOSIS — Z23 Encounter for immunization: Secondary | ICD-10-CM

## 2010-08-08 DIAGNOSIS — I10 Essential (primary) hypertension: Secondary | ICD-10-CM

## 2010-08-08 NOTE — Progress Notes (Signed)
  Subjective:    Patient ID: April Shaffer, female    DOB: 03/17/1950, 60 y.o.   MRN: [id number]  HPI Diabetes: On glucophage, well controlled Using medications without difficulties: Hypoglycemic episodes:none Hyperglycemic episodes:none Feet problems:none Blood Sugars averaging:Checking once a month eye exam within last year: due for it now  Hypertension:  Well controlled On lisinopril HCTZ  Using medication without problems or lightheadedness:  Chest pain with exertion:None Edema:None Short of breath:None Average home BPs:120/70 Other issues:  Elevated Cholesterol:  At goal <100 on crestor, but changed to lipitor 1 week ago given SE of swelling under eyes. Will have to recheck. Using medications without problems: Muscle aches: None Other complaints:  PMH and SH reviewed.   Vital signs, Meds and allergies reviewed.  ROS: See HPI.  Otherwise nontributory.       Review of Systems  Constitutional: Negative for fever and fatigue.  HENT: Negative for ear pain.   Eyes: Negative for pain.  Respiratory: Negative for chest tightness and shortness of breath.   Cardiovascular: Negative for chest pain, palpitations and leg swelling.  Gastrointestinal: Negative for abdominal pain.  Genitourinary: Negative for dysuria.       Objective:   Physical Exam  Constitutional: Vital signs are normal. She appears well-developed and well-nourished. She is cooperative.  Non-toxic appearance. She does not appear ill. No distress.  HENT:  Head: Normocephalic.  Right Ear: Hearing, tympanic membrane, external ear and ear canal normal. Tympanic membrane is not erythematous, not retracted and not bulging.  Left Ear: Hearing, tympanic membrane, external ear and ear canal normal. Tympanic membrane is not erythematous, not retracted and not bulging.  Nose: No mucosal edema or rhinorrhea. Right sinus exhibits no maxillary sinus tenderness and no frontal sinus tenderness. Left sinus exhibits no  maxillary sinus tenderness and no frontal sinus tenderness.  Mouth/Throat: Uvula is midline, oropharynx is clear and moist and mucous membranes are normal.  Eyes: Conjunctivae, EOM and lids are normal. Pupils are equal, round, and reactive to light. No foreign bodies found.  Neck: Trachea normal and normal range of motion. Neck supple. Carotid bruit is not present. No mass and no thyromegaly present.  Cardiovascular: Normal rate, regular rhythm, S1 normal, S2 normal, normal heart sounds, intact distal pulses and normal pulses.  Exam reveals no gallop and no friction rub.   No murmur heard. Pulmonary/Chest: Effort normal and breath sounds normal. Not tachypneic. No respiratory distress. She has no decreased breath sounds. She has no wheezes. She has no rhonchi. She has no rales.  Abdominal: Soft. Normal appearance and bowel sounds are normal. There is no tenderness.  Neurological: She is alert.  Skin: Skin is warm, dry and intact. No rash noted.  Psychiatric: Her speech is normal and behavior is normal. Judgment and thought content normal. Her mood appears not anxious. Cognition and memory are normal. She does not exhibit a depressed mood.        Diabetic foot exam: Normal inspection No skin breakdown Some  calluses  Normal DP pulses Normal sensation to light tough and monofilament Nails normal  Pain dorsal foot over horizontal arch.     Assessment & Plan:

## 2010-08-08 NOTE — Assessment & Plan Note (Signed)
Well controlle don crestor, but will need to check again in 3 months on new lipitor.

## 2010-08-08 NOTE — Patient Instructions (Addendum)
Return for fasting labs for lipids, CMET in 3 months. CPX with me with labs prior in 6 months.  Work on [employer], weight loss and exercsie as tolerated.  Quit smoking.

## 2010-08-08 NOTE — Assessment & Plan Note (Signed)
Well controlled. Continue current medication.  

## 2010-08-21 ENCOUNTER — Encounter: Payer: Self-pay | Admitting: Family Medicine

## 2010-08-21 ENCOUNTER — Telehealth: Payer: Self-pay | Admitting: *Deleted

## 2010-08-21 ENCOUNTER — Ambulatory Visit (INDEPENDENT_AMBULATORY_CARE_PROVIDER_SITE_OTHER): Payer: 59 | Admitting: Family Medicine

## 2010-08-21 ENCOUNTER — Other Ambulatory Visit: Payer: Self-pay | Admitting: Family Medicine

## 2010-08-21 VITALS — BP 140/72 | HR 88 | Temp 98.3°F | Ht 64.5 in | Wt 143.0 lb

## 2010-08-21 DIAGNOSIS — N760 Acute vaginitis: Secondary | ICD-10-CM

## 2010-08-21 MED ORDER — FLUCONAZOLE 150 MG PO TABS: 150.0000 mg | ORAL_TABLET | Freq: Once | ORAL | Status: DC

## 2010-08-21 NOTE — Telephone Encounter (Signed)
Appt scheduled for today with Dr. Dayton Martes.

## 2010-08-21 NOTE — Telephone Encounter (Signed)
If had recetn antibitoics I will send in difulcan, but if no recent antibitoics, she needs appt to be seen.

## 2010-08-21 NOTE — Telephone Encounter (Signed)
Patient says that she has a yeast infection. She is having a lot of burning and itching, some discharge. She is asking if she can get something called in. Uses [facility] on [address]

## 2010-08-21 NOTE — Progress Notes (Signed)
SUBJECTIVE:  60 y.o. female complains of labial irritation x 5 days. Changed her soap the day before these symptoms started. Denies abnormal vaginal bleeding or significant pelvic pain, discharge, or fever. No UTI symptoms. Denies history of known exposure to STD.  S/p hysterectomy  The PMH, PSH, Social History, Family History, Medications, and allergies have been reviewed in [facility], and have been updated if relevant.  OBJECTIVE:  BP 140/72  Pulse 88  Temp(Src) 98.3 F (36.8 C) (Oral)  Ht 5' 4.5" (1.638 m)  Wt 143 lb (64.864 kg)  BMI 24.17 kg/m2  She appears well, afebrile. Abdomen: benign, soft, nontender, no masses. Pelvic Exam: + labial erythema bilaterally, no discharge  ASSESSMENT:  Wet prep pos for yeast C. albicans vulvovaginitis  PLAN:  GC and chlamydia DNA  probe sent to lab. Treatment: Diflucan 150 mg po x 1.  ROV prn if symptoms persist or worsen. Change back to original soap.

## 2010-08-27 ENCOUNTER — Emergency Department (HOSPITAL_COMMUNITY)
Admission: EM | Admit: 2010-08-27 | Discharge: 2010-08-27 | Disposition: A | Discharge status: Home / Self Care | Payer: 59 | Attending: Emergency Medicine | Admitting: Emergency Medicine

## 2010-08-27 DIAGNOSIS — I10 Essential (primary) hypertension: Secondary | ICD-10-CM | POA: Insufficient documentation

## 2010-08-27 DIAGNOSIS — E119 Type 2 diabetes mellitus without complications: Secondary | ICD-10-CM | POA: Insufficient documentation

## 2010-08-27 DIAGNOSIS — T783XXA Angioneurotic edema, initial encounter: Secondary | ICD-10-CM | POA: Insufficient documentation

## 2010-08-27 DIAGNOSIS — R499 Unspecified voice and resonance disorder: Secondary | ICD-10-CM | POA: Insufficient documentation

## 2010-08-27 DIAGNOSIS — T46905A Adverse effect of unspecified agents primarily affecting the cardiovascular system, initial encounter: Secondary | ICD-10-CM | POA: Insufficient documentation

## 2010-08-27 DIAGNOSIS — E785 Hyperlipidemia, unspecified: Secondary | ICD-10-CM | POA: Insufficient documentation

## 2010-08-27 DIAGNOSIS — R070 Pain in throat: Secondary | ICD-10-CM | POA: Insufficient documentation

## 2010-08-27 DIAGNOSIS — Z79899 Other long term (current) drug therapy: Secondary | ICD-10-CM | POA: Insufficient documentation

## 2010-08-28 ENCOUNTER — Telehealth: Payer: Self-pay | Admitting: *Deleted

## 2010-08-28 NOTE — Telephone Encounter (Signed)
Patient advised and will call in 1 week with bp measure ment

## 2010-08-28 NOTE — Telephone Encounter (Signed)
The medication that we would be concerned about would be lisinopril (can cause angioedema ie. Swelling of lips and tounge). Lets have her stop this med ASAP. I will call in the HCTZ alone. She needs to then follow her BPs at home closely and call in 1 week with the measurements.

## 2010-08-28 NOTE — Telephone Encounter (Signed)
Patient went to [facility] ER over the weekend because her tongue was swelling. She was told that it was possibly her BP meds causing her tongue to swell and was told to take benadryl and was sent home. She is asking if you could review ER notes and see if she possibly should change her BP med. Uses [facility] on [address]

## 2010-10-30 ENCOUNTER — Other Ambulatory Visit: Payer: Self-pay | Admitting: Family Medicine

## 2010-10-31 NOTE — Telephone Encounter (Signed)
Message left on cell phone voicemail advising patient as instructed.

## 2010-10-31 NOTE — Telephone Encounter (Signed)
Ok to refill one dose of diflucan.  If symptoms persist, needs to follow up with primary.

## 2010-11-10 ENCOUNTER — Other Ambulatory Visit (INDEPENDENT_AMBULATORY_CARE_PROVIDER_SITE_OTHER): Payer: 59

## 2010-11-10 DIAGNOSIS — E785 Hyperlipidemia, unspecified: Secondary | ICD-10-CM

## 2010-11-10 LAB — LIPID PANEL
HDL: 61.1 mg/dL (ref >39.00)
Triglycerides: 149 mg/dL (ref 0.0–149.0)

## 2010-11-13 LAB — COMPREHENSIVE METABOLIC PANEL
BUN: 31 mg/dL — ABNORMAL HIGH (ref 6–23)
CO2: 17 mEq/L — ABNORMAL LOW (ref 19–32)
Calcium: 9.1 mg/dL (ref 8.4–10.5)
Chloride: 110 mEq/L (ref 96–112)
Creatinine, Ser: 1.2 mg/dL (ref 0.4–1.2)
GFR: 58.68 mL/min — ABNORMAL LOW (ref >60.00)

## 2010-11-20 ENCOUNTER — Other Ambulatory Visit: Payer: Self-pay | Admitting: Family Medicine

## 2010-11-20 DIAGNOSIS — Z1231 Encounter for screening mammogram for malignant neoplasm of breast: Secondary | ICD-10-CM

## 2010-11-27 ENCOUNTER — Ambulatory Visit (HOSPITAL_COMMUNITY)
Admission: RE | Admit: 2010-11-27 | Discharge: 2010-11-27 | Disposition: A | Discharge status: Home / Self Care | Payer: 59 | Source: Ambulatory Visit | Attending: Family Medicine | Admitting: Family Medicine

## 2010-11-27 DIAGNOSIS — Z1231 Encounter for screening mammogram for malignant neoplasm of breast: Secondary | ICD-10-CM | POA: Insufficient documentation

## 2010-11-28 ENCOUNTER — Encounter: Payer: Self-pay | Admitting: Family Medicine

## 2011-01-11 ENCOUNTER — Other Ambulatory Visit: Payer: Self-pay | Admitting: Family Medicine

## 2011-02-16 ENCOUNTER — Encounter: Payer: Self-pay | Admitting: Family Medicine

## 2011-02-16 ENCOUNTER — Ambulatory Visit (INDEPENDENT_AMBULATORY_CARE_PROVIDER_SITE_OTHER): Payer: 59 | Admitting: Family Medicine

## 2011-02-16 DIAGNOSIS — I10 Essential (primary) hypertension: Secondary | ICD-10-CM

## 2011-02-16 DIAGNOSIS — E119 Type 2 diabetes mellitus without complications: Secondary | ICD-10-CM

## 2011-02-16 DIAGNOSIS — E785 Hyperlipidemia, unspecified: Secondary | ICD-10-CM

## 2011-02-16 MED ORDER — DICLOFENAC SODIUM 1 % TD GEL: 1.0000 "application " | Freq: Every day | TRANSDERMAL | Status: DC

## 2011-02-16 NOTE — Assessment & Plan Note (Signed)
Well controlled. Continue current medication.  

## 2011-02-16 NOTE — Patient Instructions (Addendum)
Schedule lab appt for fasting labs next ewek. Schedule CPX with fasting labs prior in 6 months. Call with cholesterol results if you were off lipitor then.

## 2011-02-16 NOTE — Assessment & Plan Note (Signed)
Likely well controlled. Due for A1C recheck. .Encouraged exercise, weight loss, healthy eating habits.

## 2011-02-16 NOTE — Progress Notes (Signed)
  Subjective:    Patient ID: April Shaffer, female    DOB: 05/05/1950, 60 y.o.   MRN: [id number]  HPI   Diabetes: On glucophage, well controlled  At last check due for repeat. Lab Results  Component Value Date   HGBA1C 6.0 07/31/2010   Using medications without difficulties:  Hypoglycemic episodes:none  Hyperglycemic episodes:none  Feet problems:none  Blood Sugars averaging:Checking once a month 105 eye exam within last year: due for it now   Hypertension: Well controlled On lisinopril HCTZ  Using medication without problems or lightheadedness:  Chest pain with exertion:None  Edema:None  Short of breath:None  Average home BPs:120/70  Other issues:   Elevated Cholesterol: Last check chol was at goal but was on lipitor generic.  Had SE to crestor and lipitor. Diet: Moderate Exercise: occ Other complaints:   Review of Systems  Constitutional: Negative for fever and fatigue.  HENT: Negative for ear pain.   Eyes: Negative for pain.  Respiratory: Negative for chest tightness and shortness of breath.   Cardiovascular: Negative for chest pain, palpitations and leg swelling.  Gastrointestinal: Negative for abdominal pain.  Genitourinary: Negative for dysuria.       Objective:   Physical Exam  Constitutional: Vital signs are normal. She appears well-developed and well-nourished. She is cooperative.  Non-toxic appearance. She does not appear ill. No distress.  HENT:  Head: Normocephalic.  Right Ear: Hearing, tympanic membrane, external ear and ear canal normal. Tympanic membrane is not erythematous, not retracted and not bulging.  Left Ear: Hearing, tympanic membrane, external ear and ear canal normal. Tympanic membrane is not erythematous, not retracted and not bulging.  Nose: No mucosal edema or rhinorrhea. Right sinus exhibits no maxillary sinus tenderness and no frontal sinus tenderness. Left sinus exhibits no maxillary sinus tenderness and no frontal sinus tenderness.    Mouth/Throat: Uvula is midline, oropharynx is clear and moist and mucous membranes are normal.  Eyes: Conjunctivae, EOM and lids are normal. Pupils are equal, round, and reactive to light. No foreign bodies found.  Neck: Trachea normal and normal range of motion. Neck supple. Carotid bruit is not present. No mass and no thyromegaly present.  Cardiovascular: Normal rate, regular rhythm, S1 normal, S2 normal, normal heart sounds, intact distal pulses and normal pulses.  Exam reveals no gallop and no friction rub.   No murmur heard. Pulmonary/Chest: Effort normal and breath sounds normal. Not tachypneic. No respiratory distress. She has no decreased breath sounds. She has no wheezes. She has no rhonchi. She has no rales.  Abdominal: Soft. Normal appearance and bowel sounds are normal. There is no tenderness.  Neurological: She is alert.  Skin: Skin is warm, dry and intact. No rash noted.  Psychiatric: Her speech is normal and behavior is normal. Judgment and thought content normal. Her mood appears not anxious. Cognition and memory are normal. She does not exhibit a depressed mood.    Diabetic foot exam: Normal inspection No skin breakdown No calluses  Normal DP pulses Normal sensation to light touch and monofilament Nails normal       Assessment & Plan:

## 2011-02-16 NOTE — Assessment & Plan Note (Signed)
Due for true re-eval off med... She may have had work labs off med.. Will bring in if so.

## 2011-02-21 ENCOUNTER — Other Ambulatory Visit (INDEPENDENT_AMBULATORY_CARE_PROVIDER_SITE_OTHER): Payer: 59

## 2011-02-21 ENCOUNTER — Telehealth: Payer: Self-pay | Admitting: Internal Medicine

## 2011-02-21 DIAGNOSIS — E785 Hyperlipidemia, unspecified: Secondary | ICD-10-CM

## 2011-02-21 DIAGNOSIS — E119 Type 2 diabetes mellitus without complications: Secondary | ICD-10-CM

## 2011-02-21 LAB — COMPREHENSIVE METABOLIC PANEL
Albumin: 3.9 g/dL (ref 3.5–5.2)
Alkaline Phosphatase: 64 U/L (ref 39–117)
BUN: 32 mg/dL — ABNORMAL HIGH (ref 6–23)
CO2: 23 mEq/L (ref 19–32)
Calcium: 9.1 mg/dL (ref 8.4–10.5)
Chloride: 110 mEq/L (ref 96–112)
GFR: 40.99 mL/min — ABNORMAL LOW (ref >60.00)
Glucose, Bld: 78 mg/dL (ref 70–99)
Potassium: 4.4 mEq/L (ref 3.5–5.1)
Sodium: 141 mEq/L (ref 135–145)
Total Protein: 7.5 g/dL (ref 6.0–8.3)

## 2011-02-21 LAB — LIPID PANEL
Cholesterol: 217 mg/dL — ABNORMAL HIGH (ref 0–200)
Total CHOL/HDL Ratio: 3
Triglycerides: 85 mg/dL (ref 0.0–149.0)

## 2011-02-21 MED ORDER — IBUPROFEN 800 MG PO TABS: 800.0000 mg | ORAL_TABLET | Freq: Three times a day (TID) | ORAL | Status: DC | PRN

## 2011-02-21 NOTE — Telephone Encounter (Signed)
This message is confusing. Call patient. i suspect she wants a refill on her ibuprofen 800 mg

## 2011-02-21 NOTE — Telephone Encounter (Signed)
Patient would like to change her BP meds to 800mg  if possible.  She has no BP medication in chart.   Refill at [facility] on [address].  Please advise

## 2011-02-21 NOTE — Telephone Encounter (Signed)
Patient does need refill on Ibuprofen 800 and also bp meds. Patient will call back with name of bp medication

## 2011-02-21 NOTE — Telephone Encounter (Signed)
Ibuprofen sent in, let me know about Bp med request when she calls back.

## 2011-02-23 MED ORDER — LISINOPRIL-HYDROCHLOROTHIAZIDE 20-12.5 MG PO TABS: 2.0000 | ORAL_TABLET | Freq: Every day | ORAL | Status: DC

## 2011-02-23 NOTE — Telephone Encounter (Signed)
Addended by: Consuello Masse on: 02/23/2011 12:21 PM   Modules accepted: Orders

## 2011-02-23 NOTE — Telephone Encounter (Signed)
She was supposed to stop lisinopril after ER visit for swelling in lips/tounge... If she has remained on it all along without any continued symtpoms we can refill it.. Please verify as angioedema can be life threatening.

## 2011-02-23 NOTE — Telephone Encounter (Signed)
Addended by: Kerby Nora E on: 02/23/2011 03:14 PM   Modules accepted: Orders

## 2011-02-23 NOTE — Telephone Encounter (Signed)
Patient called and said she is on lisinopril but, it looks like she was taken off of the lisin/hctz in July due to angio edema. Please advise uses [facility] [address]

## 2011-02-23 NOTE — Telephone Encounter (Signed)
Patient has continued taken this medication and has not had anymore problems out of this medication she is taken lisino/hctz

## 2011-02-23 NOTE — Patient Instructions (Signed)
Any new meds, OTC meds, decreased fluid intake or vomiting/diarrhea?  Call in lipitor brand name only 40 mg daily .Marland Kitchen Make sure labs in 3 months scheduled for lipids and CMET.

## 2011-02-26 ENCOUNTER — Other Ambulatory Visit (INDEPENDENT_AMBULATORY_CARE_PROVIDER_SITE_OTHER): Payer: 59

## 2011-02-26 DIAGNOSIS — I10 Essential (primary) hypertension: Secondary | ICD-10-CM

## 2011-02-26 LAB — RENAL FUNCTION PANEL
Calcium: 9.2 mg/dL (ref 8.4–10.5)
Chloride: 106 mEq/L (ref 96–112)
Glucose, Bld: 84 mg/dL (ref 70–99)
Phosphorus: 3.8 mg/dL (ref 2.3–4.6)
Potassium: 4.1 mEq/L (ref 3.5–5.1)
Sodium: 139 mEq/L (ref 135–145)

## 2011-02-28 ENCOUNTER — Telehealth: Payer: Self-pay | Admitting: Family Medicine

## 2011-02-28 MED ORDER — GLIPIZIDE ER 10 MG PO TB24: 10.0000 mg | ORAL_TABLET | Freq: Every day | ORAL | Status: DC

## 2011-02-28 NOTE — Telephone Encounter (Signed)
Message copied by Excell Seltzer on Wed Feb 28, 2011  4:13 PM ------      Message from: Consuello Masse      Created: Wed Feb 28, 2011  2:13 PM       Patient advised and wants to know what she needs to take for her diabetes since she stopped her metformin

## 2011-02-28 NOTE — Telephone Encounter (Signed)
Let pt know that we will likely be able to restart her metformin once we can make sure kidney function stable. Metformin was stopped not because it could cause renal issue as much as when kidneys are not working as well metformin can build up.  WE will for now have her try glucotrol. Have her call in 1 week with fasting and 2 hour postprandial (after meals) blood sugars

## 2011-03-05 NOTE — Telephone Encounter (Signed)
Patient advised.

## 2011-03-08 ENCOUNTER — Telehealth: Payer: Self-pay | Admitting: *Deleted

## 2011-03-08 MED ORDER — SITAGLIPTIN PHOSPHATE 100 MG PO TABS: 100.0000 mg | ORAL_TABLET | Freq: Every day | ORAL | Status: DC

## 2011-03-08 NOTE — Telephone Encounter (Signed)
Patient advised that medication changed

## 2011-03-08 NOTE — Telephone Encounter (Signed)
Patient would like to change glipizide to [place] b/c blood sugars dropping into 40's

## 2011-04-12 ENCOUNTER — Encounter: Payer: Self-pay | Admitting: Family Medicine

## 2011-04-12 ENCOUNTER — Encounter: Payer: Self-pay | Admitting: *Deleted

## 2011-04-12 ENCOUNTER — Ambulatory Visit (INDEPENDENT_AMBULATORY_CARE_PROVIDER_SITE_OTHER): Payer: 59 | Admitting: Family Medicine

## 2011-04-12 VITALS — BP 130/80 | HR 77 | Temp 98.0°F | Ht 64.5 in | Wt 133.1 lb

## 2011-04-12 DIAGNOSIS — M109 Gout, unspecified: Secondary | ICD-10-CM

## 2011-04-12 MED ORDER — COLCHICINE 0.6 MG PO TABS: 0.6000 mg | ORAL_TABLET | Freq: Two times a day (BID) | ORAL | Status: DC

## 2011-04-12 MED ORDER — INDOMETHACIN 50 MG PO CAPS: 50.0000 mg | ORAL_CAPSULE | Freq: Three times a day (TID) | ORAL | Status: DC

## 2011-04-12 NOTE — Progress Notes (Signed)
  Patient Name: April Shaffer Date of Birth: 21-Nov-1950 Age: 61 y.o. Medical Record Number: [id number] Gender: female Date of Encounter: 04/12/2011  History of Present Illness:  April Shaffer is a 61 y.o. very pleasant female patient who presents with the following:  Tuesday morning woke up with gout R MTP joint The patient has had some severe pain. Swelling, and redness of the RIGHT MTP. No trauma or accident she can recall. She does have a history of having something relatively similar 20 or 30 years ago, but has not had a repeat of that event. She is familiar with gout, and her boyfriend has gout.  Basic Metabolic Panel:    Component Value Date/Time   NA 139 02/26/2011 0815   K 4.1 02/26/2011 0815   CL 106 02/26/2011 0815   CO2 25 02/26/2011 0815   BUN 27* 02/26/2011 0815   CREATININE 1.4* 02/26/2011 0815   GLUCOSE 84 02/26/2011 0815   CALCIUM 9.2 02/26/2011 0815    Past Medical History, Surgical History, Social History, Family History, Problem List, Medications, and Allergies have been reviewed and updated if relevant.  Review of Systems:  GEN: No fevers, chills. Nontoxic. Primarily MSK c/o today. MSK: Detailed in the HPI GI: tolerating PO intake without difficulty Neuro: No numbness, parasthesias, or tingling associated. Otherwise the pertinent positives of the ROS are noted above.    Physical Examination: Filed Vitals:   04/12/11 1542  BP: 130/80  Pulse: 77  Temp: 98 F (36.7 C)  TempSrc: Oral  Height: 5' 4.5" (1.638 m)  Weight: 133 lb 1.9 oz (60.383 kg)  SpO2: 100%    Body mass index is 22.50 kg/(m^2).   GEN: WDWN, NAD, Non-toxic, Alert & Oriented x 3 HEENT: Atraumatic, Normocephalic.  Ears and Nose: No external deformity. PSYCH: Normally interactive. Conversant. Not depressed or anxious appearing.  Calm demeanor.   RIGHT foot: The patient has a quite swollen 1st MTP, redness, and warmth in this area. Nontender along metatarsal shafts. Nontender at the  midfoot. Stable ankle. Nontender at either malleoli.  Assessment and Plan: 1. Gout attack  indomethacin (INDOCIN) 50 MG capsule, colchicine 0.6 MG tablet  2. Gout of big toe      She is going to start with some Indocin for 2-3 days, and if she does not have some prompting improvement then start colchicine. We discussed not to take Indocin routinely, regularly or more than 2 or 3 days

## 2011-04-13 DIAGNOSIS — M109 Gout, unspecified: Secondary | ICD-10-CM | POA: Insufficient documentation

## 2011-05-02 ENCOUNTER — Telehealth: Payer: Self-pay | Admitting: *Deleted

## 2011-05-02 NOTE — Telephone Encounter (Signed)
Can you do this

## 2011-05-02 NOTE — Telephone Encounter (Signed)
Okay to make change.. Can you have some one complete form if needs to be done ASAP.

## 2011-05-02 NOTE — Telephone Encounter (Signed)
Received fax from [facility] stating that they wish to change patients Januvia to either Onglyza or Tradjenta so her copay will be cheaper.  Form in your IN box.

## 2011-05-03 NOTE — Telephone Encounter (Signed)
done

## 2011-06-12 ENCOUNTER — Ambulatory Visit (INDEPENDENT_AMBULATORY_CARE_PROVIDER_SITE_OTHER): Payer: 59 | Admitting: Family Medicine

## 2011-06-12 ENCOUNTER — Encounter: Payer: Self-pay | Admitting: Family Medicine

## 2011-06-12 VITALS — BP 142/82 | HR 92 | Temp 98.3°F | Wt 135.5 lb

## 2011-06-12 DIAGNOSIS — M25559 Pain in unspecified hip: Secondary | ICD-10-CM

## 2011-06-12 DIAGNOSIS — M25552 Pain in left hip: Secondary | ICD-10-CM

## 2011-06-12 DIAGNOSIS — E119 Type 2 diabetes mellitus without complications: Secondary | ICD-10-CM

## 2011-06-12 DIAGNOSIS — M25551 Pain in right hip: Secondary | ICD-10-CM | POA: Insufficient documentation

## 2011-06-12 MED ORDER — SAXAGLIPTIN HCL 2.5 MG PO TABS: 2.5000 mg | ORAL_TABLET | Freq: Every day | ORAL | Status: DC

## 2011-06-12 MED ORDER — CYCLOBENZAPRINE HCL 10 MG PO TABS: 10.0000 mg | ORAL_TABLET | Freq: Two times a day (BID) | ORAL | Status: AC | PRN

## 2011-06-12 MED ORDER — TRAMADOL HCL 50 MG PO TABS: 50.0000 mg | ORAL_TABLET | Freq: Two times a day (BID) | ORAL | Status: AC | PRN

## 2011-06-12 NOTE — Assessment & Plan Note (Signed)
Tender on exam at GTB as well as at sciatic notch which points to both bursitis and sciatica Treat for now with tramadol, ice, and stretching exercises provided today. Also sent in flexeril muscle relaxant per pt request as she feels muscles are tight and may be contributing to sxs. Avoid NSAIDs 2/2 kidney insufficiency.   Lab Results  Component Value Date   CREATININE 1.4* 02/26/2011

## 2011-06-12 NOTE — Patient Instructions (Signed)
I think you have combination of sciatic nerve irritation as well as bursitis of your lateral hip We want to avoid anti inflammatories given your kidney function. Treat with ice/heat, rest, stretching exercises provided, and tramadol for discomfort. Also may try short course of muscle relaxant to help (may make you sleepy)

## 2011-06-12 NOTE — Assessment & Plan Note (Signed)
Reviewed phone note where some form filled out but no further details available and no med changes in chart. I've sent in onglyza start at 2.5mg  daily and asked pt to f/u with PCP at previously scheduled appt.

## 2011-06-12 NOTE — Progress Notes (Signed)
  Subjective:    Patient ID: April Shaffer, female    DOB: 19-Mar-1950, 61 y.o.   MRN: [id number]  HPI CC: R hip pain   9 d h/o lateral hip pain that travels to mid thigh laterally as well as to buttock.  Worse in mornings.  Denies inciting falls, injury.  Taking ibuprofen 800mg  but avoiding 2/2 makes her nauseated.  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  H/o DDD but never caused pain like this.  DM - insurance would no longer pay for [place], pt never heard back from [place], ran out of [place] last week.  Only taking metformin now.    No back pain, shooting pain down legs, fevers/chills, bowel/bladder accidents.  Lab Results  Component Value Date   CREATININE 1.4* 02/26/2011    Review of Systems Per HPI    Objective:   Physical Exam  Nursing note and vitals reviewed. Constitutional: She appears well-developed and well-nourished. No distress.  Musculoskeletal:       No midline spine tenderness.  No paraspinous mm tenderness. Neg SLR bilaterally Neg FABER bilaterally No pain with int/ext rotation at hips. Tender to palpation R GTB and R sciatic notch. No pain at SIJ  Neurological:       Sensation intact. Strength intact. 2+ DTRs BLE.       Assessment & Plan:

## 2011-06-14 ENCOUNTER — Telehealth: Payer: Self-pay | Admitting: *Deleted

## 2011-06-14 MED ORDER — NAPROXEN 500 MG PO TABS
500.0000 mg | ORAL_TABLET | Freq: Two times a day (BID) | ORAL | Status: DC | PRN
Start: 2011-06-14 — End: 2011-12-14

## 2011-06-14 NOTE — Telephone Encounter (Signed)
Patient called and said the tramadol is making her itch and she is still experiencing a great deal of pain. She asked if there was someone that could do an injection in her hip. (I know you don't do them, but couldn't remember if Dr. Patsy Lager did or not.) He is not here tomorrow,regardless which I made her aware of. She preferred to not have to get another med for pain, but says she can't wait until next week to do anything. She asks that you send something different in for her. She uses [facility] on [address].

## 2011-06-14 NOTE — Telephone Encounter (Signed)
plz notify I've sent in short course of anti inflammatory to pharmacy - take with food and try to use sparingly given h/o kidney insufficiency.  Don't take with aleve or anaprox as same med (just stronger) or with other antiinflammatory. Also plz notify stretching/strengthening exercises are in the mail as I forgot to provide at office visit.  Needs to try these to help with leg.  If not better to update [place].

## 2011-06-15 NOTE — Telephone Encounter (Signed)
Message left notifying patient of new Rx. Advised to take it with food, use sparingly and avoid other antiinflammatories. Exercises have been mailed to patient. Advised to call back if combo and meds and exercises do not help.

## 2011-07-05 ENCOUNTER — Encounter (HOSPITAL_COMMUNITY): Payer: Self-pay

## 2011-07-05 ENCOUNTER — Emergency Department (HOSPITAL_COMMUNITY)
Admission: EM | Admit: 2011-07-05 | Discharge: 2011-07-05 | Disposition: A | Discharge status: Home / Self Care | Payer: 59 | Attending: Emergency Medicine | Admitting: Emergency Medicine

## 2011-07-05 DIAGNOSIS — Z79899 Other long term (current) drug therapy: Secondary | ICD-10-CM | POA: Insufficient documentation

## 2011-07-05 DIAGNOSIS — T783XXA Angioneurotic edema, initial encounter: Secondary | ICD-10-CM | POA: Insufficient documentation

## 2011-07-05 DIAGNOSIS — F17200 Nicotine dependence, unspecified, uncomplicated: Secondary | ICD-10-CM | POA: Insufficient documentation

## 2011-07-05 DIAGNOSIS — E119 Type 2 diabetes mellitus without complications: Secondary | ICD-10-CM | POA: Insufficient documentation

## 2011-07-05 DIAGNOSIS — I10 Essential (primary) hypertension: Secondary | ICD-10-CM | POA: Insufficient documentation

## 2011-07-05 DIAGNOSIS — E785 Hyperlipidemia, unspecified: Secondary | ICD-10-CM | POA: Insufficient documentation

## 2011-07-05 DIAGNOSIS — T464X5A Adverse effect of angiotensin-converting-enzyme inhibitors, initial encounter: Secondary | ICD-10-CM

## 2011-07-05 DIAGNOSIS — T46905A Adverse effect of unspecified agents primarily affecting the cardiovascular system, initial encounter: Secondary | ICD-10-CM | POA: Insufficient documentation

## 2011-07-05 MED ORDER — RANITIDINE HCL 150 MG PO CAPS: 150.0000 mg | ORAL_CAPSULE | Freq: Every day | ORAL | Status: DC

## 2011-07-05 MED ORDER — METHYLPREDNISOLONE SODIUM SUCC 125 MG IJ SOLR
125.0000 mg | Freq: Once | INTRAMUSCULAR | Status: AC
  Administered 2011-07-05: 125 mg via INTRAVENOUS
  Filled 2011-07-05: qty 2

## 2011-07-05 MED ORDER — DIPHENHYDRAMINE HCL 25 MG PO CAPS: 25.0000 mg | ORAL_CAPSULE | Freq: Four times a day (QID) | ORAL | Status: DC

## 2011-07-05 MED ORDER — DIPHENHYDRAMINE HCL 25 MG PO CAPS: 25.0000 mg | ORAL_CAPSULE | Freq: Four times a day (QID) | ORAL | Status: DC | PRN

## 2011-07-05 MED ORDER — FAMOTIDINE IN NACL 20-0.9 MG/50ML-% IV SOLN
20.0000 mg | Freq: Once | INTRAVENOUS | Status: AC
  Administered 2011-07-05: 20 mg via INTRAVENOUS
  Filled 2011-07-05: qty 50

## 2011-07-05 MED ORDER — PREDNISONE 10 MG PO TABS: 60.0000 mg | ORAL_TABLET | Freq: Every day | ORAL | Status: DC

## 2011-07-05 MED ORDER — DIPHENHYDRAMINE HCL 50 MG/ML IJ SOLN
25.0000 mg | Freq: Once | INTRAMUSCULAR | Status: AC
  Administered 2011-07-05: 25 mg via INTRAVENOUS
  Filled 2011-07-05: qty 1

## 2011-07-05 NOTE — ED Provider Notes (Signed)
History     CSN: [id number]  Arrival date & time 07/05/11  1434   None     Chief Complaint  Patient presents with  . Oral Swelling    (Consider location/radiation/quality/duration/timing/severity/associated sxs/prior treatment) HPI Comments: Hx of oral swelling with ACE inhibitors.  Previously stopped lisinopril. Taking again x 2 months.  Took today while eating peanuts (which she has had before) and now has tongue and lower mouth swelling. No difficulty breathing or swallowing.  Has occurred twice previously.  Patient is a 61 y.o. female presenting with general illness. The history is provided by the patient.  Illness  The current episode started today. The problem occurs rarely. The problem has been unchanged. The problem is moderate. The symptoms are relieved by nothing. The symptoms are aggravated by nothing. Pertinent negatives include no fever, no abdominal pain, no diarrhea, no vomiting, no headaches, no stridor, no neck pain, no cough, no wheezing, no rash and no eye pain. She has been behaving normally.    Past Medical History  Diagnosis Date  . Diabetes mellitus, type 2   . Hypertension   . Hyperlipidemia     Past Surgical History  Procedure Date  . Varicose vein surgery 2008    stripping  . Gyn surgery 2004    total hysterectomy for mennorhagia  . Epidural block injection 02/2008    Drs. Dorthula Nettles    Family History  Problem Relation Age of Onset  . Hypertension Sister   . Hypothyroidism Sister   . Cancer Maternal Grandmother     lung    History  Substance Use Topics  . Smoking status: Current Some Day Smoker  . Smokeless tobacco: Not on file   Comment: 1 pack every 2-3 days  . Alcohol Use: Yes     2 drinks a day    OB History    Grav Para Term Preterm Abortions TAB SAB Ect Mult Living                  Review of Systems  Constitutional: Negative for fever, activity change and fatigue.  HENT: Negative for nosebleeds, neck pain and neck  stiffness.   Eyes: Negative for pain.  Respiratory: Negative for cough, chest tightness, shortness of breath, wheezing and stridor.   Cardiovascular: Negative for chest pain and leg swelling.  Gastrointestinal: Negative for vomiting, abdominal pain and diarrhea.  Genitourinary: Negative for dysuria and difficulty urinating.  Musculoskeletal: Negative for arthralgias.  Skin: Negative for rash.  Neurological: Negative for syncope, weakness and headaches.  Psychiatric/Behavioral: Negative for behavioral problems.    Allergies  Codeine and Tramadol  Home Medications   Current Outpatient Rx  Name Route Sig Dispense Refill  . COLCHICINE 0.6 MG PO TABS Oral Take 1 tablet (0.6 mg total) by mouth 2 (two) times daily. 60 tablet 2  . DICLOFENAC SODIUM 1 % TD GEL Topical Apply 1 application topically daily. 100 g 5  . DIPHENHYDRAMINE HCL 25 MG PO TABS Oral Take 25 mg by mouth every 6 (six) hours as needed. For allergies    . LISINOPRIL-HYDROCHLOROTHIAZIDE 20-12.5 MG PO TABS Oral Take 2 tablets by mouth daily. 60 tablet 11  . METFORMIN HCL 850 MG PO TABS Oral Take 850 mg by mouth 2 (two) times daily with a meal.    . NAPROXEN 500 MG PO TABS Oral Take 1 tablet (500 mg total) by mouth 2 (two) times daily as needed (pain). Take with food 40 tablet 0  . SAXAGLIPTIN HCL  2.5 MG PO TABS Oral Take 1 tablet (2.5 mg total) by mouth daily. 30 tablet 11  . DIPHENHYDRAMINE HCL 25 MG PO CAPS Oral Take 1 capsule (25 mg total) by mouth every 6 (six) hours as needed for itching. 16 capsule 0  . PREDNISONE 10 MG PO TABS Oral Take 6 tablets (60 mg total) by mouth daily. 24 tablet 0  . RANITIDINE HCL 150 MG PO CAPS Oral Take 1 capsule (150 mg total) by mouth daily. 4 capsule 0    BP 170/68  Pulse 77  Temp(Src) 98.6 F (37 C) (Oral)  Resp 22  SpO2 100%  Physical Exam  Constitutional: She is oriented to person, place, and time. She appears well-developed and well-nourished.  HENT:  Head: Normocephalic and  atraumatic.       Tongue swelling and inferior orbital swelling R > L.  No ttp or erthema.  Eyes: Conjunctivae and EOM are normal. Pupils are equal, round, and reactive to light. No scleral icterus.  Neck: Normal range of motion. Neck supple.  Cardiovascular: Normal rate, regular rhythm and normal heart sounds.   Pulmonary/Chest: Effort normal and breath sounds normal. No respiratory distress. She has no wheezes. She has no rales. She exhibits no tenderness.  Abdominal: Soft. She exhibits no distension and no mass. There is no tenderness. There is no rebound and no guarding.  Musculoskeletal: Normal range of motion. She exhibits no edema and no tenderness.  Neurological: She is alert and oriented to person, place, and time. No cranial nerve deficit. She exhibits normal muscle tone. Coordination normal.  Skin: Skin is warm and dry. No rash noted. No erythema.  Psychiatric: She has a normal mood and affect. Her behavior is normal. Judgment and thought content normal.    ED Course  Procedures (including critical care time)  Labs Reviewed  GLUCOSE, CAPILLARY - Abnormal; Notable for the following:    Glucose-Capillary 58 (*)    All other components within normal limits   No results found.   1. ACE inhibitor-aggravated angioedema       MDM  Hx of oral swelling with ACE inhibitors.  Previously stopped lisinopril. Taking again x 2 months.  Took today while eating peanuts (which she has had before) and now has tongue and lower mouth swelling. No difficulty breathing or swallowing.  Has occurred twice previously.  No wheezing or evidence of current airway obstruction.  VSS and well appearing.  Likely 2/2 ACE inhibitors but given temporal relation with peanuts and concern for allergic process will tx with H1, H2 blockers, solumedrol and observe.  5:22 PM Pt swelling slightly improved.  Protecting airway well.  Will place in CDU for further obs.  Plan for d/c if continues to improve on  solumedrol, H1, H2 blockers with strict return precautions.        Army Chaco, MD 07/05/11 [phone]

## 2011-07-05 NOTE — ED Provider Notes (Signed)
61 year old female had onset this morning of swelling of the right side of her tongue. She's had previous swelling before. Once was due to Accupril at once was due to lisinopril. Should stop taking lisinopril but then restarted it. She's not having any difficulty breathing or swallowing. Exam shows angioedema involving the right side of the tongue and also extending to the right submandibular area. There is no swelling of the posterior aspect of the tongue or the pharynx. She has no difficulty with secretions and has normal phonation. Lungs are clear. She'll need to be observed in the emergency department and that she has been advised to never take any ACE inhibitor in the future.  Dione Booze, MD 07/05/11 [phone]

## 2011-07-05 NOTE — Discharge Instructions (Signed)
Angioedema Angioedema (AE) is a sudden swelling of the eyelids, lips, lobes of ears, external genitalia, skin, and other parts of the body. AE can happen by itself. It usually begins during the night and is found on awakening. It can happen with hives and other allergic reactions. Attacks can be mild and annoying, or life-threatening if the air passages swell. AE generally occurs in a short time period (over minutes to hours) and gets better in 24 to 48 hours. It usually does not cause any serious problems.  There are 2 different kinds of AE:   Allergic AE.   Nonallergic AE.   There may be an overreaction or direct stimulation of cells that are a part of the immune system (mast cells).   There may be problems with the release of chemicals made by the body that cause swelling and inflammation (kinins). AE due to kinins can be inherited from parents (hereditary), or it can develop on its own (acquired). Acquired AE either shows up before, or along with, certain diseases or is due to the body's immune system attacking parts of the body's own cells (autoimmune).  CAUSES  Allergic  AE due to allergic reactions are caused by something that causes the body to react (trigger). Common triggers include:   Foods.   Medicines.   Latex.   Direct contact with certain fruits, vegetables, or animal saliva.   Insect stings.  Nonallergic  Mast cell stimulation may be caused by:   Medicines.   Dyes used in X-rays.   The body's own immune system reactions to parts of the body (autoimmune disease).   Possibly, some virus infections.   AE due to problems with kinins can be hereditary or acquired. Attacks are triggered by:   Mild injury.   Dental work or any surgery.   Stress.   Sudden changes in temperature.   Exercise.   Medicines.   AE due to problems with kinins can also be due to certain medicines, especially blood pressure medicines like angiotensin-converting enzyme (ACE)  inhibitors. African Americans are at nearly 5 times greater risk of developing AE than Caucasians from ACE inhibitors.  SYMPTOMS  Allergic symptoms:  Non-itchy swelling of the skin. Often the swelling is on the face and lips, but any area of the skin can swell. Sometimes, the swelling can be painful. If hives are present, there is intense itching.   Breathing problems if the air passages swell.  Nonallergic symptoms:  If internal organs are involved, there may be:   Nausea.   Abdominal pain.   Vomiting.   Difficulty swallowing.   Difficulty passing urine.   Breathing problems if the air passages swell.  Depending on the cause of AE, episodes may:  Only happen once (if triggers are removed or avoided).   Come back in unpredictable patterns.   Repeat for several years and then gradually fade away.  DIAGNOSIS  AE is diagnosed by:   Asking questions to find out how fast the symptoms began.   Taking a family history.   Physical exam.   Diagnostic tests. Tests could include:   Allergy skin tests to see if the problem is allergic.   Blood tests to diagnose hereditary and some acquired types of AE.   Other tests to see if there is a hidden disease leading to the AE.  TREATMENT  Treatment depends on the type and cause (if any) of the AE. Allergic  Allergic types of AE are treated with:   Immediate removal of   the trigger or medicine (if any).   Epinephrine injection.   Steroids.   Antihistamines.   Hospitalization for severe attacks.  Nonallergic  Mast cell stimulation types of AE are treated with:   Immediate removal of the trigger or medicine (if any).   Epinephrine injection.   Steroids.   Antihistamines.   Hospitalization for severe attacks.   Hereditary AE is treated with:   Medicines to prevent and treat attacks. There is little response to antihistamines, epinephrine, or steroids.   Preventive medicines before dental work or surgery.    Removing or avoiding medicines that trigger attacks.   Hospitalization for severe attacks.   Acquired AE is treated with:   Treating underlying disease (if any).   Medicines to prevent and treat attacks.  HOME CARE INSTRUCTIONS   Always carry your emergency allergy treatment medicines with you.   Wear a medical bracelet.   Avoid known triggers.  SEEK MEDICAL CARE IF:   You get repeat attacks.   Your attacks are more frequent or more severe despite preventive measures.   You have hereditary AE and are considering having children. It is important to discuss the risks of passing this on to your children.  SEEK IMMEDIATE MEDICAL CARE IF:   You have difficulty breathing.   You have difficulty swallowing.   You experience fainting.  This condition should be treated immediately. It can be life-threatening if it involves throat swelling. Document Released: 04/23/2001 Document Revised: 02/01/2011 Document Reviewed: 02/12/2008 Exit[employer] Patient Information 2012 [employer]. 

## 2011-07-05 NOTE — ED Notes (Signed)
RES notified, pt given 8oz of orange juice

## 2011-07-05 NOTE — ED Provider Notes (Signed)
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  Patient continues to have swelling to tongue, primarily on right, but states that she feels like swelling has improve.  No airway compromise.  Lungs CTA bilaterally.  Will continue to monitor and reassess around 2000 with anticipation of discharge home if symptoms have continued to resolve.    2005:   Swelling of tongue continues to diminish.  Patient states she is ready for discharge home.  Patient to follow-up with her PCP tomorrow.  Jimmye Norman, NP 07/05/11 2039

## 2011-07-05 NOTE — ED Notes (Signed)
sts tongue swelling since this am at 1030, pt is on ace inhibitor, pt was off of it for a while and took it again today.

## 2011-07-05 NOTE — ED Notes (Signed)
Dinner tray ordered, regular diet 

## 2011-07-06 ENCOUNTER — Telehealth: Payer: Self-pay

## 2011-07-06 MED ORDER — HYDROCHLOROTHIAZIDE 25 MG PO TABS: 25.0000 mg | ORAL_TABLET | Freq: Every day | ORAL | Status: DC

## 2011-07-06 NOTE — Telephone Encounter (Signed)
Pt faxed papers from ER visit and are in Dr Daphine Deutscher in box. Pt said the tongue is not swollen today; some tight feeling in throat but is much better today. Pt going to get rx filled from ER for prednisone,benadry and zantac. Pt wants to know what BP med Dr Ermalene Searing recommends since pt allergic to lisinopril. Pt uses [facility] on [address] and pt can be reached at [phone] or [phone].

## 2011-07-06 NOTE — Telephone Encounter (Signed)
Pt left v/m seen ER 07/05/11 with swollen tongue; allergic to Lisinopril. ER note in pts chart and pt request new BP med. Left v/m for pt to call back to get local pharmacy and update pt condition today.

## 2011-07-06 NOTE — Telephone Encounter (Signed)
Will have her start hctz at 25 mg daily (was in previous med at lower dose). Follow Bp at home to make sure at goal <130/80. Call if not.

## 2011-07-06 NOTE — Telephone Encounter (Signed)
Patient advised of medication change.

## 2011-07-08 NOTE — ED Provider Notes (Signed)
Medical screening examination/treatment/procedure(s) were conducted as a shared visit with non-physician practitioner(s) and myself.  I personally evaluated the patient during the encounter   Dione Booze, MD 07/08/11 0010

## 2011-07-08 NOTE — ED Provider Notes (Signed)
I saw and evaluated the patient, reviewed the resident's note and I agree with the findings and plan.   Dione Booze, MD 07/08/11 0001

## 2011-08-24 ENCOUNTER — Other Ambulatory Visit (INDEPENDENT_AMBULATORY_CARE_PROVIDER_SITE_OTHER): Payer: 59

## 2011-08-24 ENCOUNTER — Telehealth: Payer: Self-pay | Admitting: Family Medicine

## 2011-08-24 DIAGNOSIS — E785 Hyperlipidemia, unspecified: Secondary | ICD-10-CM

## 2011-08-24 DIAGNOSIS — E119 Type 2 diabetes mellitus without complications: Secondary | ICD-10-CM

## 2011-08-24 DIAGNOSIS — D649 Anemia, unspecified: Secondary | ICD-10-CM

## 2011-08-24 LAB — COMPREHENSIVE METABOLIC PANEL
AST: 21 U/L (ref 0–37)
Albumin: 3.8 g/dL (ref 3.5–5.2)
BUN: 20 mg/dL (ref 6–23)
CO2: 25 mEq/L (ref 19–32)
Calcium: 9.3 mg/dL (ref 8.4–10.5)
Chloride: 105 mEq/L (ref 96–112)
Creatinine, Ser: 1.1 mg/dL (ref 0.4–1.2)
GFR: 66.02 mL/min (ref >60.00)
Glucose, Bld: 66 mg/dL — ABNORMAL LOW (ref 70–99)
Potassium: 3.3 mEq/L — ABNORMAL LOW (ref 3.5–5.1)

## 2011-08-24 LAB — CBC WITH DIFFERENTIAL/PLATELET
Basophils Absolute: 0 10*3/uL (ref 0.0–0.1)
HCT: 35.6 % — ABNORMAL LOW (ref 36.0–46.0)
Hemoglobin: 11.9 g/dL — ABNORMAL LOW (ref 12.0–15.0)
Lymphs Abs: 2.2 10*3/uL (ref 0.7–4.0)
MCV: 94.2 fl (ref 78.0–100.0)
Monocytes Absolute: 0.5 10*3/uL (ref 0.1–1.0)
Monocytes Relative: 9.3 % (ref 3.0–12.0)
Neutro Abs: 2.6 10*3/uL (ref 1.4–7.7)
Platelets: 190 10*3/uL (ref 150.0–400.0)
RDW: 13.6 % (ref 11.5–14.6)

## 2011-08-24 LAB — LIPID PANEL
Cholesterol: 208 mg/dL — ABNORMAL HIGH (ref 0–200)
HDL: 62.2 mg/dL (ref >39.00)
Triglycerides: 83 mg/dL (ref 0.0–149.0)

## 2011-08-24 LAB — HEMOGLOBIN A1C: Hgb A1c MFr Bld: 5.7 % (ref 4.6–6.5)

## 2011-08-24 NOTE — Telephone Encounter (Signed)
Message copied by Excell Seltzer on Fri Aug 24, 2011 10:30 AM ------      Message from: Baldomero Lamy      Created: Fri Aug 24, 2011  9:12 AM      Regarding: Labs this am       F/u labs this am, please order

## 2011-08-28 ENCOUNTER — Ambulatory Visit (INDEPENDENT_AMBULATORY_CARE_PROVIDER_SITE_OTHER): Payer: 59 | Admitting: Family Medicine

## 2011-08-28 ENCOUNTER — Encounter: Payer: Self-pay | Admitting: Family Medicine

## 2011-08-28 VITALS — BP 120/70 | HR 86 | Temp 98.8°F | Ht 64.5 in | Wt 130.0 lb

## 2011-08-28 DIAGNOSIS — I10 Essential (primary) hypertension: Secondary | ICD-10-CM

## 2011-08-28 DIAGNOSIS — E119 Type 2 diabetes mellitus without complications: Secondary | ICD-10-CM

## 2011-08-28 DIAGNOSIS — E785 Hyperlipidemia, unspecified: Secondary | ICD-10-CM

## 2011-08-28 DIAGNOSIS — R0989 Other specified symptoms and signs involving the circulatory and respiratory systems: Secondary | ICD-10-CM | POA: Insufficient documentation

## 2011-08-28 MED ORDER — METFORMIN HCL ER 750 MG PO TB24: 750.0000 mg | ORAL_TABLET | Freq: Every day | ORAL | Status: DC

## 2011-08-28 NOTE — Assessment & Plan Note (Signed)
Great control, some low blood sugars.Antony Blackbird to long acting metformin so she can take once a day an d lower dose. Stop onglyza.

## 2011-08-28 NOTE — Assessment & Plan Note (Signed)
Eval with dopplers.

## 2011-08-28 NOTE — Assessment & Plan Note (Signed)
Well controlled. Continue current medication.  

## 2011-08-28 NOTE — Assessment & Plan Note (Signed)
Poor control.. Start red yeast rice. Recheck in 3 months.

## 2011-08-28 NOTE — Patient Instructions (Addendum)
Start daily multivitamin.. Make sure it contains some potassium.  To try to decrease low blood sugars.. We will change to long acting metformin at 750 mg daily, stop onglyza  Start red yeast rice 2400 mg divided daily. Stop at front desk to set up carotid dopplers.

## 2011-08-28 NOTE — Progress Notes (Signed)
Diabetes: On glucophage, onglyza.. She often only takes one tablet daily. Lab Results  Component Value Date   HGBA1C 5.7 08/24/2011  Using medications without difficulties:  Hypoglycemic episodes:none  Hyperglycemic episodes:none  Feet problems:none  Blood Sugars averaging:Checking once a month 106-127, was in 60 in ER. Has lost weight 5 lbs since last OV. eye exam within last year: due for it now   Hypertension: Well controlled on  HCTZ, had angioedema with lisinopril Using medication without problems or lightheadedness:  Chest pain with exertion:None  Edema:None  Short of breath:None  Average home BPs:120/70   Other issues:  Gout in toe better, no further problems. Hip pain improved, using naprosyn as needed.  Elevated Cholesterol:LDL not at goal <100 on no medicine. Had SE to crestor and lipitor. Lab Results  Component Value Date   CHOL 208* 08/24/2011   HDL 62.20 08/24/2011   LDLCALC 97 11/10/2010   LDLDIRECT 127.3 08/24/2011   TRIG 83.0 08/24/2011   CHOLHDL 3 08/24/2011     Diet: Moderate  Exercise: walking a lot. Other complaints:  Wt Readings from Last 3 Encounters:  08/28/11 130 lb (58.968 kg)  06/12/11 135 lb 8 oz (61.462 kg)  04/12/11 133 lb 1.9 oz (60.383 kg)     Review of Systems  Constitutional: Negative for fever and fatigue.  HENT: Negative for ear pain.  Eyes: Negative for pain.  Respiratory: Negative for chest tightness and shortness of breath.  Cardiovascular: Negative for chest pain, palpitations and leg swelling.  Gastrointestinal: Negative for abdominal pain.  Genitourinary: Negative for dysuria.    Objective:   Physical Exam  Constitutional: Vital signs are normal. She appears well-developed and well-nourished. She is cooperative. Non-toxic appearance. She does not appear ill. No distress.  HENT:  Head: Normocephalic.  Right Ear: Hearing, tympanic membrane, external ear and ear canal normal. Tympanic membrane is not erythematous, not retracted  and not bulging.  Left Ear: Hearing, tympanic membrane, external ear and ear canal normal. Tympanic membrane is not erythematous, not retracted and not bulging.  Nose: No mucosal edema or rhinorrhea. Right sinus exhibits no maxillary sinus tenderness and no frontal sinus tenderness. Left sinus exhibits no maxillary sinus tenderness and no frontal sinus tenderness.  Mouth/Throat: Uvula is midline, oropharynx is clear and moist and mucous membranes are normal.  Eyes: Conjunctivae, EOM and lids are normal. Pupils are equal, round, and reactive to light. No foreign bodies found.  Neck: Trachea normal and normal range of motion. Neck supple. Carotid bruit is not present. No mass and no thyromegaly present.  Cardiovascular: Normal rate, regular rhythm, S1 normal, S2 normal, normal heart sounds, intact distal pulses and normal pulses. Exam reveals no gallop and no friction rub.  B BRUIT HEARD OVER CAROTIDS No murmur heard.  Pulmonary/Chest: Effort normal and breath sounds normal. Not tachypneic. No respiratory distress. She has no decreased breath sounds. She has no wheezes. She has no rhonchi. She has no rales.  Abdominal: Soft. Normal appearance and bowel sounds are normal. There is no tenderness.  Neurological: She is alert.  Skin: Skin is warm, dry and intact. No rash noted.  Psychiatric: Her speech is normal and behavior is normal. Judgment and thought content normal. Her mood appears not anxious. Cognition and memory are normal. She does not exhibit a depressed mood.   Diabetic foot exam:  Normal inspection  No skin breakdown  No calluses  Normal DP pulses  Normal sensation to light touch and monofilament  Nails normal  VARICOSITIES B severe.

## 2011-09-05 ENCOUNTER — Telehealth: Payer: Self-pay

## 2011-09-05 ENCOUNTER — Encounter (INDEPENDENT_AMBULATORY_CARE_PROVIDER_SITE_OTHER): Payer: 59

## 2011-09-05 DIAGNOSIS — R0989 Other specified symptoms and signs involving the circulatory and respiratory systems: Secondary | ICD-10-CM

## 2011-09-05 DIAGNOSIS — I6529 Occlusion and stenosis of unspecified carotid artery: Secondary | ICD-10-CM

## 2011-09-05 NOTE — Telephone Encounter (Signed)
Will call to discuss results with pt on Friday when I am back in office. Pt would be good candidate for vascular referral.

## 2011-09-05 NOTE — Telephone Encounter (Signed)
Forward to Dr. B

## 2011-09-05 NOTE — Telephone Encounter (Signed)
Called for acute result from carotid dopplers with R ICA of 80-99% blockage with velocities greater than 400.   Consider vascular consult if PCP deems appropriate.

## 2011-09-05 NOTE — Telephone Encounter (Signed)
Gina with vascular lab called with preliminary results, saw >80% stenosis in rt ICA;  Gina request to speak with Dr. Ermalene Searing. Dr Patsy Lager spoke with Almira Coaster.

## 2011-09-07 NOTE — Telephone Encounter (Signed)
Attempted to contact pt. No answer. Could not leave message.    Please continue to contact her.. Let her know there is blockage in her neck vessels as I feared from her exam. Next step is to send her to a vascular surgeon to evaluate further to decrease her risk of stroke.

## 2011-09-10 NOTE — Telephone Encounter (Signed)
Called patient on work phone, she called back. Gave her results of Carotid. Vascular Surgeon appt made with Dr Hart Rochester on 09/17/2011, patient has been notified. MK

## 2011-09-14 ENCOUNTER — Encounter: Payer: Self-pay | Admitting: Vascular Surgery

## 2011-09-17 ENCOUNTER — Encounter: Payer: Self-pay | Admitting: *Deleted

## 2011-09-17 ENCOUNTER — Other Ambulatory Visit: Payer: Self-pay | Admitting: *Deleted

## 2011-09-17 ENCOUNTER — Ambulatory Visit (INDEPENDENT_AMBULATORY_CARE_PROVIDER_SITE_OTHER): Payer: 59 | Admitting: Vascular Surgery

## 2011-09-17 ENCOUNTER — Encounter: Payer: Self-pay | Admitting: Vascular Surgery

## 2011-09-17 VITALS — BP 204/84 | HR 79 | Resp 18 | Ht 64.5 in | Wt 137.0 lb

## 2011-09-17 DIAGNOSIS — I6529 Occlusion and stenosis of unspecified carotid artery: Secondary | ICD-10-CM

## 2011-09-17 DIAGNOSIS — I6521 Occlusion and stenosis of right carotid artery: Secondary | ICD-10-CM | POA: Insufficient documentation

## 2011-09-17 NOTE — Progress Notes (Signed)
Subjective:     Patient ID: April Shaffer, female   DOB: 09/11/1950, 61 y.o.   MRN: [id number]  HPI this 61-year-old female was referred by Dr. Amy Bedsole for evaluation of carotid occlusive disease. The patient recently had a physical exam and a bruit was heard. Carotid duplex exam was performed on July 10 which I have reviewed and reveals a 90% right internal carotid stenosis an approximate 60-70% left internal carotid stenosis. Patient denies any history of stroke, TIAs, lateralizing weakness, aphasia, visual loss, facial asymmetry, or other neurologic symptoms. She has never previously had a carotid duplex exam performed. She also denies a history of coronary artery disease or claudication.  Past Medical History  Diagnosis Date  . Diabetes mellitus, type 2   . Hypertension   . Hyperlipidemia     History  Substance Use Topics  . Smoking status: Current Some Day Smoker -- 25 years    Types: Cigarettes  . Smokeless tobacco: Never Used   Comment: 1 pack every 2-3 days  . Alcohol Use: Yes     2 drinks a day    Family History  Problem Relation Age of Onset  . Hypertension Sister   . Hypothyroidism Sister   . Cancer Maternal Grandmother     lung    Allergies  Allergen Reactions  . Lisinopril-Hydrochlorothiazide Other (See Comments)    angioedema  . Codeine Itching  . Tramadol Itching    Current outpatient prescriptions:colchicine 0.6 MG tablet, Take 1 tablet (0.6 mg total) by mouth 2 (two) times daily., Disp: 60 tablet, Rfl: 2;  diclofenac sodium (VOLTAREN) 1 % GEL, Apply 1 application topically daily., Disp: 100 g, Rfl: 5;  diphenhydrAMINE (BENADRYL) 25 MG tablet, Take 25 mg by mouth every 6 (six) hours as needed. For allergies, Disp: , Rfl:  hydrochlorothiazide (HYDRODIURIL) 25 MG tablet, Take 1 tablet (25 mg total) by mouth daily., Disp: 30 tablet, Rfl: 3;  metFORMIN (GLUCOPHAGE-XR) 750 MG 24 hr tablet, Take 1 tablet (750 mg total) by mouth daily with breakfast., Disp: 90  tablet, Rfl: 3;  naproxen (NAPROSYN) 500 MG tablet, Take 1 tablet (500 mg total) by mouth 2 (two) times daily as needed (pain). Take with food, Disp: 40 tablet, Rfl: 0  BP 204/84  Pulse 79  Resp 18  Ht 5' 4.5" (1.638 m)  Wt 137 lb (62.143 kg)  BMI 23.15 kg/m2  Body mass index is 23.15 kg/(m^2).           Review of Systems denies chest pain, dyspnea on exertion, PND, orthopnea, claudication, bronchitis, does have type 2 diabetes mellitus which is well controlled as well as hyperlipidemia and hypertension. All other systems negative and a complete review of systems. Has history of bilateral laser ablation    Objective:   Physical Exam blood pressure 204/84 heart rate 79 respirations 18 Gen.-alert and oriented x3 in no apparent distress HEENT normal for age Lungs no rhonchi or wheezing Cardiovascular regular rhythm no murmurs carotid pulses 3+ palpable -harsh carotid bruits heard bilaterally. Very high-pitched on the left.  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 Risks and benefits of carotid surgery for this asymptomatic stenosis discussed with patient and she would like to proceed      

## 2011-09-18 ENCOUNTER — Encounter (HOSPITAL_COMMUNITY): Payer: Self-pay | Admitting: Pharmacy Technician

## 2011-09-21 ENCOUNTER — Encounter (HOSPITAL_COMMUNITY)
Admission: RE | Admit: 2011-09-21 | Discharge: 2011-09-21 | Disposition: A | Discharge status: Home / Self Care | Payer: 59 | Source: Ambulatory Visit | Attending: Vascular Surgery | Admitting: Vascular Surgery

## 2011-09-21 ENCOUNTER — Ambulatory Visit (HOSPITAL_COMMUNITY)
Admission: RE | Admit: 2011-09-21 | Discharge: 2011-09-21 | Disposition: A | Discharge status: Home / Self Care | Payer: 59 | Source: Ambulatory Visit | Attending: Vascular Surgery | Admitting: Vascular Surgery

## 2011-09-21 ENCOUNTER — Encounter (HOSPITAL_COMMUNITY): Payer: Self-pay

## 2011-09-21 DIAGNOSIS — M51379 Other intervertebral disc degeneration, lumbosacral region without mention of lumbar back pain or lower extremity pain: Secondary | ICD-10-CM | POA: Insufficient documentation

## 2011-09-21 DIAGNOSIS — Z01818 Encounter for other preprocedural examination: Secondary | ICD-10-CM | POA: Insufficient documentation

## 2011-09-21 DIAGNOSIS — Z01812 Encounter for preprocedural laboratory examination: Secondary | ICD-10-CM | POA: Insufficient documentation

## 2011-09-21 DIAGNOSIS — M5137 Other intervertebral disc degeneration, lumbosacral region: Secondary | ICD-10-CM | POA: Insufficient documentation

## 2011-09-21 HISTORY — DX: Sleep apnea, unspecified: G47.30

## 2011-09-21 HISTORY — DX: Unspecified osteoarthritis, unspecified site: M19.90

## 2011-09-21 HISTORY — DX: Occlusion and stenosis of unspecified carotid artery: I65.29

## 2011-09-21 LAB — URINALYSIS, ROUTINE W REFLEX MICROSCOPIC
Glucose, UA: NEGATIVE mg/dL
Nitrite: NEGATIVE
Protein, ur: NEGATIVE mg/dL
pH: 6 (ref 5.0–8.0)

## 2011-09-21 LAB — APTT: aPTT: 25 seconds (ref 24–37)

## 2011-09-21 LAB — URINE MICROSCOPIC-ADD ON

## 2011-09-21 LAB — PROTIME-INR: INR: 0.94 (ref 0.00–1.49)

## 2011-09-21 LAB — CBC
HCT: 33.7 % — ABNORMAL LOW (ref 36.0–46.0)
Platelets: 206 10*3/uL (ref 150–400)
RDW: 13 % (ref 11.5–15.5)
WBC: 7.8 10*3/uL (ref 4.0–10.5)

## 2011-09-21 LAB — COMPREHENSIVE METABOLIC PANEL
ALT: 10 U/L (ref 0–35)
AST: 20 U/L (ref 0–37)
Albumin: 3.7 g/dL (ref 3.5–5.2)
Alkaline Phosphatase: 69 U/L (ref 39–117)
BUN: 25 mg/dL — ABNORMAL HIGH (ref 6–23)
Chloride: 104 mEq/L (ref 96–112)
Potassium: 4.2 mEq/L (ref 3.5–5.1)
Total Bilirubin: 0.3 mg/dL (ref 0.3–1.2)

## 2011-09-21 LAB — SURGICAL PCR SCREEN: MRSA, PCR: NEGATIVE

## 2011-09-21 LAB — PREPARE RBC (CROSSMATCH)

## 2011-09-21 NOTE — Pre-Procedure Instructions (Addendum)
[name]  09/21/2011   Your procedure is scheduled on: 09/27/11  Report to [name] at 530AM.  Call this number if you have problems the morning of surgery: [phone]   Remember:   Do not eat foodor drink:After Midnight.    Take these medicines the morning of surgery with A SIP OF WATER: colchicine, benadryl,  STOP naproxen, voltaren    NO BLOOD SUGAR MED AM OF SUGAR   Do not wear jewelry, make-up or nail polish.  Do not wear lotions, powders, or perfumes. You may wear deodorant.  Do not shave 48 hours prior to surgery. Men may shave face and neck.  Do not bring valuables to the hospital.  Contacts, dentures or bridgework may not be worn into surgery.  Leave suitcase in the car. After surgery it may be brought to your room.  For patients admitted to the hospital, checkout time is 11:00 AM the day of discharge.   Patients discharged the day of surgery will not be allowed to drive home.  Name and phone number of your driver: [name] [phone]  Special Instructions: CHG Shower Use Special Wash: 1/2 bottle night before surgery and 1/2 bottle morning of surgery.   Please read over the following fact sheets that you were given: Pain Booklet, Coughing and Deep Breathing, Blood Transfusion Information, MRSA Information and Surgical Site Infection Prevention

## 2011-09-26 MED ORDER — SODIUM CHLORIDE 0.9 % IV SOLN: INTRAVENOUS | Status: DC

## 2011-09-26 MED ORDER — DEXTROSE 5 % IV SOLN
1.5000 g | INTRAVENOUS | Status: AC
  Administered 2011-09-27: 1.5 g via INTRAVENOUS
  Filled 2011-09-26: qty 1.5

## 2011-09-27 ENCOUNTER — Ambulatory Visit (HOSPITAL_COMMUNITY): Payer: 59 | Admitting: Anesthesiology

## 2011-09-27 ENCOUNTER — Encounter (HOSPITAL_COMMUNITY): Payer: Self-pay

## 2011-09-27 ENCOUNTER — Encounter (HOSPITAL_COMMUNITY): Payer: Self-pay | Admitting: Anesthesiology

## 2011-09-27 ENCOUNTER — Inpatient Hospital Stay (HOSPITAL_COMMUNITY)
Admission: RE | Admit: 2011-09-27 | Discharge: 2011-10-02 | DRG: 039 | Disposition: A | Discharge status: Home / Self Care | Payer: 59 | Source: Ambulatory Visit | Attending: Vascular Surgery | Admitting: Vascular Surgery

## 2011-09-27 ENCOUNTER — Encounter (HOSPITAL_COMMUNITY)
Admission: RE | Disposition: A | Discharge status: Home / Self Care | Payer: Self-pay | Source: Ambulatory Visit | Attending: Vascular Surgery

## 2011-09-27 DIAGNOSIS — E119 Type 2 diabetes mellitus without complications: Secondary | ICD-10-CM | POA: Diagnosis present

## 2011-09-27 DIAGNOSIS — I6529 Occlusion and stenosis of unspecified carotid artery: Secondary | ICD-10-CM

## 2011-09-27 DIAGNOSIS — E785 Hyperlipidemia, unspecified: Secondary | ICD-10-CM | POA: Diagnosis present

## 2011-09-27 DIAGNOSIS — F17200 Nicotine dependence, unspecified, uncomplicated: Secondary | ICD-10-CM | POA: Diagnosis present

## 2011-09-27 DIAGNOSIS — R1310 Dysphagia, unspecified: Secondary | ICD-10-CM | POA: Diagnosis not present

## 2011-09-27 DIAGNOSIS — I10 Essential (primary) hypertension: Secondary | ICD-10-CM | POA: Diagnosis present

## 2011-09-27 HISTORY — PX: CAROTID ENDARTERECTOMY: SUR193

## 2011-09-27 HISTORY — PX: ENDARTERECTOMY: SHX5162

## 2011-09-27 LAB — GLUCOSE, CAPILLARY
Glucose-Capillary: 83 mg/dL (ref 70–99)
Glucose-Capillary: 126 mg/dL — ABNORMAL HIGH (ref 70–99)
Glucose-Capillary: 101 mg/dL — ABNORMAL HIGH (ref 70–99)

## 2011-09-27 LAB — HEPARIN LEVEL (UNFRACTIONATED): Heparin Unfractionated: <0.1 IU/mL — ABNORMAL LOW (ref 0.30–0.70)

## 2011-09-27 SURGERY — ENDARTERECTOMY, CAROTID
Anesthesia: General | Site: Neck | Laterality: Right | Wound class: Clean

## 2011-09-27 MED ORDER — HYDROMORPHONE HCL PF 1 MG/ML IJ SOLN
0.5000 mg | INTRAMUSCULAR | Status: DC | PRN
  Administered 2011-09-27 (×3): 1 mg via INTRAVENOUS
  Administered 2011-09-28: 0.5 mg via INTRAVENOUS
  Filled 2011-09-27 (×4): qty 1

## 2011-09-27 MED ORDER — LABETALOL HCL 5 MG/ML IV SOLN
10.0000 mg | INTRAVENOUS | Status: DC | PRN
  Filled 2011-09-27: qty 4

## 2011-09-27 MED ORDER — LACTATED RINGERS IV SOLN
INTRAVENOUS | Status: DC | PRN
  Administered 2011-09-27 (×2): via INTRAVENOUS

## 2011-09-27 MED ORDER — ONDANSETRON HCL 4 MG/2ML IJ SOLN: 4.0000 mg | Freq: Four times a day (QID) | INTRAMUSCULAR | Status: DC | PRN

## 2011-09-27 MED ORDER — PHENOL 1.4 % MT LIQD: 1.0000 | OROMUCOSAL | Status: DC | PRN

## 2011-09-27 MED ORDER — BISACODYL 10 MG RE SUPP: 10.0000 mg | Freq: Every day | RECTAL | Status: DC | PRN

## 2011-09-27 MED ORDER — OXYCODONE-ACETAMINOPHEN 5-325 MG PO TABS
1.0000 | ORAL_TABLET | ORAL | Status: DC | PRN
  Administered 2011-09-28 – 2011-09-29 (×6): 1 via ORAL
  Filled 2011-09-27 (×7): qty 1

## 2011-09-27 MED ORDER — HEPARIN SODIUM (PORCINE) 1000 UNIT/ML IJ SOLN
INTRAMUSCULAR | Status: DC | PRN
  Administered 2011-09-27: 2000 [IU] via INTRAVENOUS
  Administered 2011-09-27: 6000 [IU] via INTRAVENOUS

## 2011-09-27 MED ORDER — PHENYLEPHRINE HCL 10 MG/ML IJ SOLN
10.0000 mg | INTRAVENOUS | Status: DC | PRN
  Administered 2011-09-27: 15 ug/min via INTRAVENOUS

## 2011-09-27 MED ORDER — SODIUM CHLORIDE 0.9 % IV SOLN: 500.0000 mL | Freq: Once | INTRAVENOUS | Status: AC | PRN

## 2011-09-27 MED ORDER — PROPOFOL 10 MG/ML IV EMUL
INTRAVENOUS | Status: DC | PRN
  Administered 2011-09-27: 170 mg via INTRAVENOUS

## 2011-09-27 MED ORDER — IOHEXOL 300 MG/ML  SOLN
INTRAMUSCULAR | Status: AC
  Filled 2011-09-27: qty 1

## 2011-09-27 MED ORDER — DEXTROSE 5 % IV SOLN
1.5000 g | Freq: Two times a day (BID) | INTRAVENOUS | Status: AC
  Administered 2011-09-27 – 2011-09-28 (×2): 1.5 g via INTRAVENOUS
  Filled 2011-09-27 (×2): qty 1.5

## 2011-09-27 MED ORDER — ACETAMINOPHEN 650 MG RE SUPP: 325.0000 mg | RECTAL | Status: DC | PRN

## 2011-09-27 MED ORDER — POTASSIUM CHLORIDE IN NACL 20-0.9 MEQ/L-% IV SOLN
INTRAVENOUS | Status: DC
  Administered 2011-09-27 – 2011-09-29 (×3): via INTRAVENOUS
  Filled 2011-09-27 (×5): qty 1000

## 2011-09-27 MED ORDER — SODIUM CHLORIDE 0.9 % IR SOLN
Status: DC | PRN
  Administered 2011-09-27: 08:00:00

## 2011-09-27 MED ORDER — INSULIN ASPART 100 UNIT/ML ~~LOC~~ SOLN
0.0000 [IU] | SUBCUTANEOUS | Status: DC
  Administered 2011-09-27: 2 [IU] via SUBCUTANEOUS
  Administered 2011-09-28 – 2011-09-29 (×2): 3 [IU] via SUBCUTANEOUS
  Administered 2011-09-30: 1 [IU] via SUBCUTANEOUS

## 2011-09-27 MED ORDER — GLYCOPYRROLATE 0.2 MG/ML IJ SOLN
INTRAMUSCULAR | Status: DC | PRN
  Administered 2011-09-27: .6 mg via INTRAVENOUS

## 2011-09-27 MED ORDER — FENTANYL CITRATE 0.05 MG/ML IJ SOLN
INTRAMUSCULAR | Status: DC | PRN
  Administered 2011-09-27: 150 ug via INTRAVENOUS
  Administered 2011-09-27: 50 ug via INTRAVENOUS
  Administered 2011-09-27: 100 ug via INTRAVENOUS

## 2011-09-27 MED ORDER — SODIUM CHLORIDE 0.9 % IR SOLN
Status: DC | PRN
  Administered 2011-09-27: 2000 mL
  Administered 2011-09-27: 1000 mL

## 2011-09-27 MED ORDER — METOPROLOL TARTRATE 1 MG/ML IV SOLN: 2.0000 mg | INTRAVENOUS | Status: DC | PRN

## 2011-09-27 MED ORDER — LIDOCAINE HCL (PF) 1 % IJ SOLN
INTRAMUSCULAR | Status: AC
  Filled 2011-09-27: qty 30

## 2011-09-27 MED ORDER — HYDRALAZINE HCL 20 MG/ML IJ SOLN
10.0000 mg | INTRAMUSCULAR | Status: DC | PRN
  Administered 2011-09-27 – 2011-09-28 (×3): 10 mg via INTRAVENOUS
  Filled 2011-09-27 (×3): qty 0.5

## 2011-09-27 MED ORDER — PROTAMINE SULFATE 10 MG/ML IV SOLN
INTRAVENOUS | Status: DC | PRN
  Administered 2011-09-27 (×5): 10 mg via INTRAVENOUS

## 2011-09-27 MED ORDER — HYDROMORPHONE HCL PF 1 MG/ML IJ SOLN
0.2500 mg | INTRAMUSCULAR | Status: DC | PRN
  Administered 2011-09-27 (×4): 0.5 mg via INTRAVENOUS

## 2011-09-27 MED ORDER — HEPARIN (PORCINE) IN NACL 100-0.45 UNIT/ML-% IJ SOLN
700.0000 [IU]/h | INTRAMUSCULAR | Status: DC
  Administered 2011-09-27: 500 [IU]/h via INTRAVENOUS
  Filled 2011-09-27 (×2): qty 250

## 2011-09-27 MED ORDER — ONDANSETRON HCL 4 MG/2ML IJ SOLN: 4.0000 mg | Freq: Once | INTRAMUSCULAR | Status: DC | PRN

## 2011-09-27 MED ORDER — ACETAMINOPHEN 325 MG PO TABS: 325.0000 mg | ORAL_TABLET | ORAL | Status: DC | PRN

## 2011-09-27 MED ORDER — DOPAMINE-DEXTROSE 3.2-5 MG/ML-% IV SOLN: 3.0000 ug/kg/min | INTRAVENOUS | Status: DC

## 2011-09-27 MED ORDER — ROCURONIUM BROMIDE 100 MG/10ML IV SOLN
INTRAVENOUS | Status: DC | PRN
  Administered 2011-09-27: 40 mg via INTRAVENOUS
  Administered 2011-09-27: 10 mg via INTRAVENOUS

## 2011-09-27 MED ORDER — HEMOSTATIC AGENTS (NO CHARGE) OPTIME
TOPICAL | Status: DC | PRN
  Administered 2011-09-27: 1 via TOPICAL

## 2011-09-27 MED ORDER — HYDROCHLOROTHIAZIDE 25 MG PO TABS
25.0000 mg | ORAL_TABLET | Freq: Every day | ORAL | Status: DC
  Administered 2011-09-27 – 2011-10-02 (×6): 25 mg via ORAL
  Filled 2011-09-27 (×6): qty 1

## 2011-09-27 MED ORDER — OXYCODONE-ACETAMINOPHEN 5-325 MG PO TABS: 1.0000 | ORAL_TABLET | ORAL | Status: DC | PRN

## 2011-09-27 MED ORDER — NEOSTIGMINE METHYLSULFATE 1 MG/ML IJ SOLN
INTRAMUSCULAR | Status: DC | PRN
  Administered 2011-09-27: 3 mg via INTRAVENOUS

## 2011-09-27 MED ORDER — HYDROMORPHONE HCL PF 1 MG/ML IJ SOLN
INTRAMUSCULAR | Status: AC
  Filled 2011-09-27: qty 1

## 2011-09-27 MED ORDER — DIPHENHYDRAMINE HCL 50 MG/ML IJ SOLN
25.0000 mg | Freq: Once | INTRAMUSCULAR | Status: AC
  Administered 2011-09-27: 25 mg via INTRAVENOUS

## 2011-09-27 MED ORDER — POTASSIUM CHLORIDE CRYS ER 20 MEQ PO TBCR: 20.0000 meq | EXTENDED_RELEASE_TABLET | Freq: Every day | ORAL | Status: DC | PRN

## 2011-09-27 MED ORDER — LIDOCAINE HCL (CARDIAC) 20 MG/ML IV SOLN
INTRAVENOUS | Status: DC | PRN
  Administered 2011-09-27: 50 mg via INTRAVENOUS

## 2011-09-27 MED ORDER — METFORMIN HCL ER 750 MG PO TB24
750.0000 mg | ORAL_TABLET | Freq: Every day | ORAL | Status: DC
  Administered 2011-09-28 – 2011-10-02 (×5): 750 mg via ORAL
  Filled 2011-09-27 (×6): qty 1

## 2011-09-27 MED ORDER — ASPIRIN EC 325 MG PO TBEC
325.0000 mg | DELAYED_RELEASE_TABLET | Freq: Every day | ORAL | Status: DC
  Administered 2011-09-28 – 2011-10-02 (×5): 325 mg via ORAL
  Filled 2011-09-27 (×5): qty 1

## 2011-09-27 MED ORDER — PANTOPRAZOLE SODIUM 40 MG IV SOLR
40.0000 mg | Freq: Every day | INTRAVENOUS | Status: DC
  Administered 2011-09-28 (×2): 40 mg via INTRAVENOUS
  Filled 2011-09-27 (×3): qty 40

## 2011-09-27 MED ORDER — DICLOFENAC SODIUM 1 % TD GEL
1.0000 "application " | Freq: Every day | TRANSDERMAL | Status: DC
  Administered 2011-09-27: 1 via TOPICAL
  Filled 2011-09-27: qty 100

## 2011-09-27 SURGICAL SUPPLY — 47 items
CANISTER SUCTION 2500CC (MISCELLANEOUS) ×2 IMPLANT
CATH ROBINSON RED A/P 18FR (CATHETERS) ×2 IMPLANT
CATH SUCT 10FR WHISTLE TIP (CATHETERS) ×2 IMPLANT
CLIP TI MEDIUM 24 (CLIP) ×2 IMPLANT
CLIP TI WIDE RED SMALL 24 (CLIP) ×2 IMPLANT
CLOTH BEACON ORANGE TIMEOUT ST (SAFETY) ×2 IMPLANT
COVER SURGICAL LIGHT HANDLE (MISCELLANEOUS) ×2 IMPLANT
CRADLE DONUT ADULT HEAD (MISCELLANEOUS) ×2 IMPLANT
DECANTER SPIKE VIAL GLASS SM (MISCELLANEOUS) IMPLANT
DRAIN HEMOVAC 1/8 X 5 (WOUND CARE) ×1 IMPLANT
DRAPE WARM FLUID 44X44 (DRAPE) ×2 IMPLANT
DRSG COVADERM 4X6 (GAUZE/BANDAGES/DRESSINGS) ×1 IMPLANT
ELECT REM PT RETURN 9FT ADLT (ELECTROSURGICAL) ×2
ELECTRODE REM PT RTRN 9FT ADLT (ELECTROSURGICAL) ×1 IMPLANT
EVACUATOR SILICONE 100CC (DRAIN) ×1 IMPLANT
GLOVE BIO SURGEON STRL SZ 6.5 (GLOVE) ×1 IMPLANT
GLOVE BIOGEL PI IND STRL 6.5 (GLOVE) IMPLANT
GLOVE BIOGEL PI IND STRL 7.0 (GLOVE) IMPLANT
GLOVE BIOGEL PI INDICATOR 6.5 (GLOVE) ×1
GLOVE BIOGEL PI INDICATOR 7.0 (GLOVE) ×3
GLOVE ECLIPSE 6.5 STRL STRAW (GLOVE) ×1 IMPLANT
GLOVE ECLIPSE 7.0 STRL STRAW (GLOVE) ×1 IMPLANT
GLOVE SS BIOGEL STRL SZ 7 (GLOVE) ×1 IMPLANT
GLOVE SUPERSENSE BIOGEL SZ 7 (GLOVE) ×2
GOWN STRL NON-REIN LRG LVL3 (GOWN DISPOSABLE) ×6 IMPLANT
INSERT FOGARTY SM (MISCELLANEOUS) ×2 IMPLANT
KIT BASIN OR (CUSTOM PROCEDURE TRAY) ×2 IMPLANT
KIT CATH SUCT 8FR (CATHETERS) ×1 IMPLANT
KIT ROOM TURNOVER OR (KITS) ×2 IMPLANT
NEEDLE 22X1 1/2 (OR ONLY) (NEEDLE) IMPLANT
NS IRRIG 1000ML POUR BTL (IV SOLUTION) ×5 IMPLANT
PACK CAROTID (CUSTOM PROCEDURE TRAY) ×2 IMPLANT
PAD ARMBOARD 7.5X6 YLW CONV (MISCELLANEOUS) ×4 IMPLANT
PATCH HEMASHIELD 8X75 (Vascular Products) ×1 IMPLANT
SHUNT CAROTID BYPASS 12FRX15.5 (VASCULAR PRODUCTS) IMPLANT
SPECIMEN JAR SMALL (MISCELLANEOUS) ×2 IMPLANT
SUT ETHILON 3 0 PS 1 (SUTURE) ×1 IMPLANT
SUT PROLENE 6 0 CC (SUTURE) ×7 IMPLANT
SUT PROLENE 7 0 BV 1 (SUTURE) ×1 IMPLANT
SUT SILK 2 0 FS (SUTURE) ×2 IMPLANT
SUT VIC AB 2-0 CT1 27 (SUTURE) ×2
SUT VIC AB 2-0 CT1 TAPERPNT 27 (SUTURE) ×1 IMPLANT
SUT VIC AB 3-0 X1 27 (SUTURE) ×2 IMPLANT
SYR CONTROL 10ML LL (SYRINGE) IMPLANT
TOWEL OR 17X24 6PK STRL BLUE (TOWEL DISPOSABLE) ×2 IMPLANT
TOWEL OR 17X26 10 PK STRL BLUE (TOWEL DISPOSABLE) ×3 IMPLANT
WATER STERILE IRR 1000ML POUR (IV SOLUTION) ×2 IMPLANT

## 2011-09-27 NOTE — Progress Notes (Signed)
Noted that patients' tongue extrudes to the right.  Dr. Hart Rochester informed.  Dr. Hart Rochester said this is expected, that the hypoglossal nerve was irritated during surgery, and the tongue will extrude to the right.

## 2011-09-27 NOTE — Interval H&P Note (Signed)
History and Physical Interval Note:  09/27/2011 7:30 AM  [name]  has presented today for surgery, with the diagnosis of RIGHT ICA STENOSIS  The various methods of treatment have been discussed with the patient and family. After consideration of risks, benefits and other options for treatment, the patient has consented to  Procedure(s) (LRB): ENDARTERECTOMY CAROTID (Right) as a surgical intervention .  The patient's history has been reviewed, patient examined, no change in status, stable for surgery.  I have reviewed the patient's chart and labs.  Questions were answered to the patient's satisfaction.     April Shaffer

## 2011-09-27 NOTE — Op Note (Signed)
OPERATIVE REPORT  Date of Surgery: 09/27/2011  Surgeon: Josephina Gip, MD  Assistant: Della Goo Pre-op Diagnosis: RIGHT INTERNAL CAROTID ARTERY STENOSIS-severe-asymptomatic Post-op Diagnosis: RIGHT INTERNAL CAROTID ARTERY STENOSIS-severe-asymptomatic with small diffusely diseased distal right internal carotid artery Procedure: Procedure(s): Right carotid endarterectomy with Dacron patch angioplasty-extensive Anesthesia: General  EBL: 200 cc  Complications: None  Procedure Details: Brief history this patient was found to have a 90+ percent right internal carotid stenosis which was asymptomatic and no history of CVA. She also had a 70% left internal carotid stenosis. She was referred for surgical repair of this severely stenotic lesion  Patient taken to the operating room placed in the supine position at which time satisfactory general endotracheal anesthesia was administered. The right neck was prepped with Betadine scrub and solution draped in routine sterile manner. Incision was made on the anterior border of the sternocleidomastoid muscle carried down through subcutaneous tissue and platysma using the Bovie. The common facial vein and external jugular veins were ligated with 3-0 silk ties and divided exposing the common internal and external carotid arteries. Common carotid artery was very small vessel with severe plaque beginning at the bifurcation and extending up the internal carotid which was also quite small vessel thing about 2 to half millimeters in size. A #8 she was prepared. The internal carotid was exposed well up above the crossing of the hypoglossal nerve. The posterior occipital branch of the external carotid was ligated and divided to assist in the exposure. Patient was then heparinized the carotid vessels occluded with vascular clamps. A longitudinal opening made in the common carotid 15 blade extended up the internal carotid with Potts scissors. The plaque was severely  stenotic at least 90%. The plaque continued up the postero-medial wall necessitating a very long arthrotomy. Of distal vessel was quite small. We never were able to get beyond the distal extent of this posterior plaque. One attempt was made to gently insert an 8 mm shunt but it met resistance there is very sluggish backbleeding and was decided to abandon that effort. After extending the arm around me distally as far as possible until we encountered the base of the skull the intima was removed and the distal plaque was blindly removed from the distal vessel since this was the limit of our exposure. There was some sluggish backbleeding. An attempt was made to pass a 2 dilator distally very gently and it seemed to me obstruction at the base of the skull. In order only loose debris was removed from the endarterectomy site with no other options it was decided to sent the patch in place. The arthrotomy was closed with a Dacron patch using 6-0 Prolene. Prior to completion of closure shunt flushing was performed closure completed with reestablishment of flow initially of the external and internal branch. The internal and external were carefully evaluated with a Doppler. There was excellent flow of the external carotid. There was obstructive sounding flow in the internal carotid although there was distal flow present. This did not seem to change with time. A Jackson-Pratt drain was brought out through an inferiorly based stab wound secured with a nylon suture. Protamine was given to reverse the heparin following adequate hemostasis the wound was closed in layers with Vicryl in a subcuticular fashion sterile dressing applied patient taken to the recovery room in stable condition she awoke immediately following the procedure neurologically intact    Josephina Gip, MD 09/27/2011 10:57 AM

## 2011-09-27 NOTE — Anesthesia Postprocedure Evaluation (Signed)
  Anesthesia Post-op Note  Patient: April Shaffer  Procedure(s) Performed: Procedure(s) (LRB): ENDARTERECTOMY CAROTID (Right)  Patient Location: PACU  Anesthesia Type: General  Level of Consciousness: awake, alert , oriented and patient cooperative  Airway and Oxygen Therapy: Patient Spontanous Breathing and Patient connected to nasal cannula oxygen  Post-op Pain: mild  Post-op Assessment: Post-op Vital signs reviewed, Patient's Cardiovascular Status Stable, Respiratory Function Stable, Patent Airway, No signs of Nausea or vomiting and Pain level controlled  Post-op Vital Signs: stable  Complications: No apparent anesthesia complications

## 2011-09-27 NOTE — Progress Notes (Signed)
cbg [id number] at [facility]

## 2011-09-27 NOTE — Preoperative (Signed)
Beta Blockers   Reason not to administer Beta Blockers:Not Applicable 

## 2011-09-27 NOTE — H&P (View-Only) (Signed)
Subjective:     Patient ID: April Shaffer, female   DOB: 1950-10-15, 61 y.o.   MRN: [id number]  HPI this 62 year old female was referred by Dr. Kerby Nora for evaluation of carotid occlusive disease. The patient recently had a physical exam and a bruit was heard. Carotid duplex exam was performed on July 10 which I have reviewed and reveals a 90% right internal carotid stenosis an approximate 60-70% left internal carotid stenosis. Patient denies any history of stroke, TIAs, lateralizing weakness, aphasia, visual loss, facial asymmetry, or other neurologic symptoms. She has never previously had a carotid duplex exam performed. She also denies a history of coronary artery disease or claudication.  Past Medical History  Diagnosis Date  . Diabetes mellitus, type 2   . Hypertension   . Hyperlipidemia     History  Substance Use Topics  . Smoking status: Current Some Day Smoker -- 25 years    Types: Cigarettes  . Smokeless tobacco: Never Used   Comment: 1 pack every 2-3 days  . Alcohol Use: Yes     2 drinks a day    Family History  Problem Relation Age of Onset  . Hypertension Sister   . Hypothyroidism Sister   . Cancer Maternal Grandmother     lung    Allergies  Allergen Reactions  . Lisinopril-Hydrochlorothiazide Other (See Comments)    angioedema  . Codeine Itching  . Tramadol Itching    Current outpatient prescriptions:colchicine 0.6 MG tablet, Take 1 tablet (0.6 mg total) by mouth 2 (two) times daily., Disp: 60 tablet, Rfl: 2;  diclofenac sodium (VOLTAREN) 1 % GEL, Apply 1 application topically daily., Disp: 100 g, Rfl: 5;  diphenhydrAMINE (BENADRYL) 25 MG tablet, Take 25 mg by mouth every 6 (six) hours as needed. For allergies, Disp: , Rfl:  hydrochlorothiazide (HYDRODIURIL) 25 MG tablet, Take 1 tablet (25 mg total) by mouth daily., Disp: 30 tablet, Rfl: 3;  metFORMIN (GLUCOPHAGE-XR) 750 MG 24 hr tablet, Take 1 tablet (750 mg total) by mouth daily with breakfast., Disp: 90  tablet, Rfl: 3;  naproxen (NAPROSYN) 500 MG tablet, Take 1 tablet (500 mg total) by mouth 2 (two) times daily as needed (pain). Take with food, Disp: 40 tablet, Rfl: 0  BP 204/84  Pulse 79  Resp 18  Ht 5' 4.5" (1.638 m)  Wt 137 lb (62.143 kg)  BMI 23.15 kg/m2  Body mass index is 23.15 kg/(m^2).           Review of Systems denies chest pain, dyspnea on exertion, PND, orthopnea, claudication, bronchitis, does have type 2 diabetes mellitus which is well controlled as well as hyperlipidemia and hypertension. All other systems negative and a complete review of systems. Has history of bilateral laser ablation    Objective:   Physical Exam blood pressure 204/84 heart rate 79 respirations 18 Gen.-alert and oriented x3 in no apparent distress HEENT normal for age Lungs no rhonchi or wheezing Cardiovascular regular rhythm no murmurs carotid pulses 3+ palpable -harsh carotid bruits heard bilaterally. Very high-pitched on the left.  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 Risks and benefits of carotid surgery for this asymptomatic stenosis discussed with patient and she would like to proceed

## 2011-09-27 NOTE — Anesthesia Preprocedure Evaluation (Signed)
Anesthesia Evaluation  Patient identified by MRN, date of birth, ID band Patient awake    Reviewed: Allergy & Precautions, H&P , NPO status , Patient's Chart, lab work & pertinent test results  Airway  TM Distance: >3 FB Neck ROM: full    Dental   Pulmonary sleep apnea , Current Smoker,          Cardiovascular hypertension, + Peripheral Vascular Disease Rhythm:regular Rate:Normal     Neuro/Psych    GI/Hepatic   Endo/Other  Well Controlled, Type 2, Oral Hypoglycemic Agents  Renal/GU      Musculoskeletal   Abdominal   Peds  Hematology   Anesthesia Other Findings   Reproductive/Obstetrics                           Anesthesia Physical Anesthesia Plan  ASA: III  Anesthesia Plan: General   Post-op Pain Management:    Induction: Intravenous  Airway Management Planned: Oral ETT  Additional Equipment: Arterial line  Intra-op Plan:   Post-operative Plan: Extubation in OR  Informed Consent: I have reviewed the patients History and Physical, chart, labs and discussed the procedure including the risks, benefits and alternatives for the proposed anesthesia with the patient or authorized representative who has indicated his/her understanding and acceptance.     Plan Discussed with: CRNA, Anesthesiologist and Surgeon  Anesthesia Plan Comments:         Anesthesia Quick Evaluation

## 2011-09-27 NOTE — Transfer of Care (Signed)
Immediate Anesthesia Transfer of Care Note  Patient: April Shaffer  Procedure(s) Performed: Procedure(s) (LRB): ENDARTERECTOMY CAROTID (Right)  Patient Location: PACU  Anesthesia Type: General  Level of Consciousness: awake and alert   Airway & Oxygen Therapy: Patient Spontanous Breathing and Patient connected to nasal cannula oxygen  Post-op Assessment: Report given to PACU RN and Post -op Vital signs reviewed and stable  Post vital signs: Reviewed and stable  Complications: No apparent anesthesia complications

## 2011-09-27 NOTE — Anesthesia Procedure Notes (Signed)
Procedure Name: Intubation Date/Time: 09/27/2011 7:55 AM Performed by: Gwenyth Allegra Pre-anesthesia Checklist: Emergency Drugs available, Patient identified, Timeout performed, Suction available and Patient being monitored Patient Re-evaluated:Patient Re-evaluated prior to inductionOxygen Delivery Method: Circle system utilized Preoxygenation: Pre-oxygenation with 100% oxygen Intubation Type: IV induction Ventilation: Mask ventilation without difficulty and Oral airway inserted - appropriate to patient size Laryngoscope Size: Mac and 3 Grade View: Grade I Tube type: Oral Tube size: 7.0 mm Airway Equipment and Method: Stylet Placement Confirmation: ETT inserted through vocal cords under direct vision,  breath sounds checked- equal and bilateral and positive ETCO2 Secured at: 22 cm Tube secured with: Tape Dental Injury: Teeth and Oropharynx as per pre-operative assessment

## 2011-09-27 NOTE — Progress Notes (Signed)
ANTIBIOTIC CONSULT NOTE - INITIAL  Pharmacy Consult for Antibiotic renal dose adjustment  Indication: s/p Right CEA  Allergies  Allergen Reactions  . Lisinopril-Hydrochlorothiazide Other (See Comments)    angioedema  . Codeine Itching  . Tramadol Itching    Patient Measurements: Height: 5\' 4"  (162.6 cm) Weight: 141 lb 15.6 oz (64.4 kg) IBW/kg (Calculated) : 54.7    Vital Signs: Temp: 97.9 F (36.6 C) (08/01 1300) Temp src: Oral (08/01 1300) BP: 131/51 mmHg (08/01 1500) Pulse Rate: 71  (08/01 1500) Intake/Output from previous day:   Intake/Output from this shift: Total I/O In: 1013.3 [I.V.:963.3; IV Piggyback:50] Out: -   Labs preop:  Scr 1.02 Estimated CrCl ~ 51.3 ml/min     Microbiology: Recent Results (from the past 720 hour(s))  SURGICAL PCR SCREEN     Status: Normal   Collection Time   09/21/11  4:02 PM      Component Value Range Status Comment   MRSA, PCR NEGATIVE  NEGATIVE Final    Staphylococcus aureus NEGATIVE  NEGATIVE Final     Medical History: Past Medical History  Diagnosis Date  . Diabetes mellitus, type 2   . Hypertension   . Hyperlipidemia   . Sleep apnea     hx had tonsilectomy and no longer has   . Arthritis   . Carotid artery narrowing      Assessment: 61 y.o female s/p Right CEA.  Zinacef 1.5 g IV q12h x 2 doses post op. Preop SCr =1.02, Estimated CrCl ~ 51 ml/min.   Goal of Therapy:  appropriate dose for renal function  Plan:  No adjustment in Zinacef 1.5 g IV q12h necessary.  Pharmacy will sign off.  Please re-consult pharmacy if further assistance needed.   Noah Delaine, RPh Clinical Pharmacist Pager: [phone] 09/27/2011,4:03 PM

## 2011-09-27 NOTE — Progress Notes (Signed)
Prep to right neck completed.

## 2011-09-28 ENCOUNTER — Inpatient Hospital Stay (HOSPITAL_COMMUNITY): Payer: 59

## 2011-09-28 ENCOUNTER — Encounter (HOSPITAL_COMMUNITY): Payer: Self-pay | Admitting: Vascular Surgery

## 2011-09-28 LAB — TYPE AND SCREEN: Unit division: 0

## 2011-09-28 LAB — GLUCOSE, CAPILLARY
Glucose-Capillary: 129 mg/dL — ABNORMAL HIGH (ref 70–99)
Glucose-Capillary: 98 mg/dL (ref 70–99)
Glucose-Capillary: 124 mg/dL — ABNORMAL HIGH (ref 70–99)

## 2011-09-28 LAB — BASIC METABOLIC PANEL
BUN: 15 mg/dL (ref 6–23)
Calcium: 8.6 mg/dL (ref 8.4–10.5)
Creatinine, Ser: 0.77 mg/dL (ref 0.50–1.10)
GFR calc Af Amer: >90 mL/min (ref >90)
GFR calc non Af Amer: >90 mL/min (ref >90)
Glucose, Bld: 137 mg/dL — ABNORMAL HIGH (ref 70–99)
Potassium: 3.8 mEq/L (ref 3.5–5.1)

## 2011-09-28 LAB — CBC
HCT: 32.9 % — ABNORMAL LOW (ref 36.0–46.0)
Hemoglobin: 11 g/dL — ABNORMAL LOW (ref 12.0–15.0)
MCH: 30.6 pg (ref 26.0–34.0)
MCHC: 33.4 g/dL (ref 30.0–36.0)
MCV: 91.4 fL (ref 78.0–100.0)
RDW: 12.7 % (ref 11.5–15.5)

## 2011-09-28 LAB — HEMOGLOBIN A1C: Hgb A1c MFr Bld: 5.5 % (ref <5.7)

## 2011-09-28 MED ORDER — CLOPIDOGREL BISULFATE 75 MG PO TABS
75.0000 mg | ORAL_TABLET | Freq: Every day | ORAL | Status: DC
  Administered 2011-09-28 – 2011-10-02 (×5): 75 mg via ORAL
  Filled 2011-09-28 (×6): qty 1

## 2011-09-28 NOTE — Progress Notes (Addendum)
VASCULAR AND VEIN SURGERY POST - OP CEA PROGRESS NOTE  Date of Surgery: 09/27/2011 Surgeon: Surgeon(s): Pryor Ochoa, MD 1 Day Post-Op right Carotid Endarterectomy .  HPI: April Shaffer is a 61 y.o. female who is 1 Day Post-Op right Carotid Endarterectomy . Patient is doing well.Patient reports headache; Patient reports difficulty swallowing; denies weakness in upper or lower extremities; Pt. denies other symptoms of stroke or TIA.  IMAGING: No results found.  Significant Diagnostic Studies: CBC Lab Results  Component Value Date   WBC 12.0* 09/28/2011   HGB 11.0* 09/28/2011   HCT 32.9* 09/28/2011   MCV 91.4 09/28/2011   PLT 190 09/28/2011    BMET    Component Value Date/Time   NA 138 09/28/2011 0400   K 3.8 09/28/2011 0400   CL 103 09/28/2011 0400   CO2 23 09/28/2011 0400   GLUCOSE 137* 09/28/2011 0400   BUN 15 09/28/2011 0400   CREATININE 0.77 09/28/2011 0400   CALCIUM 8.6 09/28/2011 0400   GFRNONAA >90 09/28/2011 0400   GFRAA >90 09/28/2011 0400    COAG Lab Results  Component Value Date   INR 0.94 09/21/2011   No results found for this basename: PTT      Intake/Output Summary (Last 24 hours) at 09/28/11 0734 Last data filed at 09/28/11 0700  Gross per 24 hour  Intake 2688.57 ml  Output   1207 ml  Net 1481.57 ml    Physical Exam:  BP Readings from Last 3 Encounters:  09/28/11 108/47  09/28/11 108/47  09/21/11 146/80   Temp Readings from Last 3 Encounters:  09/28/11 98.4 F (36.9 C) Oral  09/28/11 98.4 F (36.9 C) Oral  09/21/11 98 F (36.7 C)    SpO2 Readings from Last 3 Encounters:  09/28/11 99%  09/28/11 99%  09/21/11 98%   Pulse Readings from Last 3 Encounters:  09/28/11 90  09/28/11 90  09/21/11 89    Pt is A&O x 3 Gait is normal Speech is fluent right Neck Wound is healing well Patient with Positive tongue deviation and Negative facial droop Pt has good and equal strength in all extremities  Assessment:Plan April Shaffer is a 61 y.o. female is  S/P Right Carotid endarterectomy Pt is voiding, ambulating  Dysphagia - will get swallow study Tongue deviation as expected with manipulation of hypoglossal nerve Post - op reperfusion H/A ? Transfer to floor JP 60cc x 24 hours    Marlowe Shores [phone] 09/28/2011 7:34 AM   Agree with above Difficulty swallowing is not surprising given the fact that dissection continued to the base of the skull in proximity to glossopharyngeal nerve Retraction of hypoglossal nerve was necessary throughout the procedure to perform operation and tongue deviation to right side should be temporary but not surprising Neurologic exam is normal otherwise  We'll get speech therapy consult for swallowing study and proceed as indicated  We'll also continue heparin for another day or 2 and begin by mouth Plavix when patient can take oral medications Plan to keep an [facility] today because of swallowing issues and probably transfer in a.m.

## 2011-09-28 NOTE — Procedures (Signed)
Objective Swallowing Evaluation: Modified Barium Swallowing Study  Patient Details  Name: April Shaffer MRN: [id number] Date of Birth: 03-05-1950  Today's Date: 09/28/2011 Time: 1100-1125 SLP Time Calculation (min): 25 min  Past Medical History:  Past Medical History  Diagnosis Date  . Diabetes mellitus, type 2   . Hypertension   . Hyperlipidemia   . Sleep apnea     hx had tonsilectomy and no longer has   . Arthritis   . Carotid artery narrowing    Past Surgical History:  Past Surgical History  Procedure Date  . Varicose vein surgery 2008    stripping  . Gyn surgery 2004    total hysterectomy for mennorhagia,,salpingoophorectomy  . Epidural block injection 02/2008    Drs. Dorthula Nettles  . Tonsillectomy   . Endarterectomy 09/27/2011    Procedure: ENDARTERECTOMY CAROTID;  Surgeon: Pryor Ochoa, MD;  Location: [facility] OR;  Service: Vascular;  Laterality: Right;   HPI:  April Shaffer is a 61 y.o. female who is 1 Day Post-Op right Carotid Endarterectomy . Patient is doing well.Patient reports headache; Patient reports difficulty swallowing; denies weakness in upper or lower extremities; Pt. denies other symptoms of stroke or TIA. According to RN and operative report pt had significant, high occlusion of her right carotid.      Assessment / Plan / Recommendation Clinical Impression  Dysphagia Diagnosis: Moderate pharyngeal phase dysphagia;Moderate cervical esophageal phase dysphagia Clinical impression: Pt presents with apperance of an acute reversible dysphagia with sensory and motor deficits resulting from edema and neuromotor impaiment s/p right carotid endarterectomy as well as a baseline structural difference (appearance of osteophyte in anterior cervical spine). Primary pharyngeal dysphagia consists of decreased base of tongue retraction, pharyngeal contration, anterior laryngeal elevation and reduced airway closure. UES opening is also reduced due to reduced motility of  larynx but also due to appearance of osteophyte and edema surrounding UES impairing adequate bolus transit. Moderate residuals remain post swallow in valleculae and pyriform sinuses, suspect probably on right side though unable to visualize in the lateral view. Sensed penetration occurs during and after the swallow (of stasis in pyriforms) and pt clears consistently with a throat clear. The pt would benefit from a restricted dysphagia 3 (mechanical soft) diet and nectar thick liquids with a chin tuck which aids in transit of stasis. Hopeful that as swelling decreases, function will improve.     Treatment Recommendation  Therapy as outlined in treatment plan below    Diet Recommendation Dysphagia 3 (Mechanical Soft);Nectar-thick liquid   Liquid Administration via: Cup Medication Administration: Whole meds with puree Supervision: Patient able to self feed Compensations: Slow rate;Small sips/bites;Multiple dry swallows after each bite/sip;Follow solids with liquid;Clear throat intermittently Postural Changes and/or Swallow Maneuvers: Seated upright 90 degrees;Upright 30-60 min after meal;Chin tuck    Other  Recommendations Recommended Consults: MBS Oral Care Recommendations: Oral care QID Other Recommendations: Order thickener from pharmacy   Follow Up Recommendations  Outpatient SLP    Frequency and Duration min 2x/week  2 weeks   Pertinent Vitals/Pain NA    SLP Swallow Goals Patient will consume recommended diet without observed clinical signs of aspiration with: Independent assistance Patient will utilize recommended strategies during swallow to increase swallowing safety with: Independent assistance   General HPI: April Shaffer is a 61 y.o. female who is 1 Day Post-Op right Carotid Endarterectomy . Patient is doing well.Patient reports headache; Patient reports difficulty swallowing; denies weakness in upper or lower extremities; Pt. denies other symptoms of  stroke or TIA. According  to RN and operative report pt had significant, high occlusion of her right carotid.  Type of Study: Modified Barium Swallowing Study Reason for Referral: Objectively evaluate swallowing function Previous Swallow Assessment: BSE Diet Prior to this Study: NPO Temperature Spikes Noted: No Respiratory Status: Room air History of Recent Intubation: Yes Length of Intubations (days): 1 days Date extubated: 09/27/11 Behavior/Cognition: Alert;Cooperative Oral Cavity - Dentition: Missing dentition Oral Motor / Sensory Function: Within functional limits Self-Feeding Abilities: Able to feed self Patient Positioning: Upright in chair Baseline Vocal Quality: Clear Volitional Cough: Strong Volitional Swallow: Able to elicit Anatomy:  (appearance of boney protrusion in cervical spine. swelling) Pharyngeal Secretions: Not observed secondary MBS    Reason for Referral Objectively evaluate swallowing function   Oral Phase     Pharyngeal Phase Pharyngeal Phase: Impaired   Cervical Esophageal Phase Cervical Esophageal Phase: Impaired    April Shaffer, April Shaffer 09/28/2011, 12:20 PM

## 2011-09-28 NOTE — Care Management Note (Unsigned)
    Page 1 of 1   10/02/2011     10:23:16 AM   CARE MANAGEMENT NOTE 10/02/2011  Patient:  April Shaffer, April Shaffer   Account Number:  [id number]  Date Initiated:  09/28/2011  Documentation initiated by:  AMERSON,JULIE  Subjective/Objective Assessment:   PT S/P RT CEA ON 09/27/11.  PTA, PT INDEPENDENT, LIVES WITH FRIEND.     Action/Plan:   MET WITH PT TO DISCUSS DC PLANS.  PT STATES FRIENDS WILL PROVIDE 24HR CARE IF NEEDED.   Anticipated DC Date:  10/01/2011   Anticipated DC Plan:  HOME W HOME HEALTH SERVICES      DC Planning Services  CM consult  OP Neuro Rehab      Choice offered to / List presented to:             Status of service:  In process, will continue to follow Medicare Important Message given?   (If response is "NO", the following Medicare IM given date fields will be blank) Date Medicare IM given:   Date Additional Medicare IM given:    Discharge Disposition:    Per UR Regulation:  Reviewed for med. necessity/level of care/duration of stay  If discussed at Long Length of Stay Meetings, dates discussed:    Comments:  10/02/11  1011  Shaley Leavens SIMMONS RN, BSN [phone] NEURO REHAB FORM FAXED FOR OP SPEECH THERAPY, CONTACT NUMBER GIVEN TO PT.  10/01/11  1039  Markus Casten SIMMONS RN, BSN [phone] REFERRAL RECEIVED FOR OP SPEECH THERAPY;  FORM PLACED ON FRONT OF SHADOW CHART FOR MD SIGNATURE-  WILL FAX WHEN COMPLETE.  NCM FOLLOWING.

## 2011-09-28 NOTE — Evaluation (Signed)
Clinical/Bedside Swallow Evaluation Patient Details  Name: April Shaffer MRN: [id number] Date of Birth: 1950-05-22  Today's Date: 09/28/2011 Time: 0850-0910 SLP Time Calculation (min): 20 min  Past Medical History:  Past Medical History  Diagnosis Date  . Diabetes mellitus, type 2   . Hypertension   . Hyperlipidemia   . Sleep apnea     hx had tonsilectomy and no longer has   . Arthritis   . Carotid artery narrowing    Past Surgical History:  Past Surgical History  Procedure Date  . Varicose vein surgery 2008    stripping  . Gyn surgery 2004    total hysterectomy for mennorhagia,,salpingoophorectomy  . Epidural block injection 02/2008    Drs. Dorthula Nettles  . Tonsillectomy    HPI:  April Shaffer is a 61 y.o. female who is 1 Day Post-Op right Carotid Endarterectomy . Patient is doing well.Patient reports headache; Patient reports difficulty swallowing; denies weakness in upper or lower extremities; Pt. denies other symptoms of stroke or TIA. According to RN and operative report pt had significant, high occlusion of her right carotid.    Assessment / Plan / Recommendation Clinical Impression  Pt presents with indication of cranial nerve deficits following right carotid endarterectomy: weakness and decreased sensation of right tongue, right palate (unilateral elevation and decreased gag response), right lower face. POssible involvement of CN VII, IX, X, XII. Pt with overt signs of residual (multiple swallows) and penetration/aspriation (thorat clearing). Suspect an acute reversible dysphagia secondary to edema affecting nerve function. Objective test warranted to determine safety with POs prior to initiating diet/taking meds. MBS requested for today at 11 am.     Aspiration Risk  Moderate    Diet Recommendation NPO;Ice chips PRN after oral care        Other  Recommendations Recommended Consults: MBS   Follow Up Recommendations       Frequency and Duration        Pertinent Vitals/Pain NA    SLP Swallow Goals     Swallow Study Prior Functional Status       General HPI: April Shaffer is a 61 y.o. female who is 1 Day Post-Op right Carotid Endarterectomy . Patient is doing well.Patient reports headache; Patient reports difficulty swallowing; denies weakness in upper or lower extremities; Pt. denies other symptoms of stroke or TIA. According to RN and operative report pt had significant, high occlusion of her right carotid.  Type of Study: Bedside swallow evaluation Diet Prior to this Study: NPO Temperature Spikes Noted: No Respiratory Status: Room air History of Recent Intubation: Yes Length of Intubations (days): 1 days Date extubated: 09/27/11 Behavior/Cognition: Alert;Cooperative Oral Cavity - Dentition: Missing dentition Self-Feeding Abilities: Able to feed self Patient Positioning: Upright in chair Baseline Vocal Quality: Clear Volitional Cough: Strong Volitional Swallow: Able to elicit    Oral/Motor/Sensory Function Overall Oral Motor/Sensory Function: Impaired (Reduced gag reflex, rduced motor, sensory function) Labial ROM: Reduced right Labial Symmetry: Abnormal symmetry right Labial Strength: Reduced Lingual ROM: Reduced right Lingual Symmetry: Abnormal symmetry right Lingual Strength: Reduced Lingual Sensation: Reduced Facial ROM: Reduced right Facial Symmetry: Within Functional Limits Facial Strength: Within Functional Limits Facial Sensation: Reduced (bottom 1/3 of face,near chin) Velum: Impaired right Mandible: Within Functional Limits   Ice Chips Ice chips: Impaired Presentation: Spoon Pharyngeal Phase Impairments: Throat Clearing - Immediate;Decreased hyoid-laryngeal movement   Thin Liquid Thin Liquid: Impaired Presentation: Cup;Straw Pharyngeal  Phase Impairments: Decreased hyoid-laryngeal movement;Multiple swallows;Throat Clearing - Immediate    Nectar  Thick Nectar Thick Liquid: Not tested   Honey Thick Honey  Thick Liquid: Not tested   Puree Puree: Not tested   Solid Solid: Not tested    April Shaffer, April Shaffer 09/28/2011,9:32 AM

## 2011-09-29 ENCOUNTER — Encounter (HOSPITAL_COMMUNITY): Payer: Self-pay | Admitting: *Deleted

## 2011-09-29 LAB — BASIC METABOLIC PANEL
BUN: 10 mg/dL (ref 6–23)
Calcium: 8.8 mg/dL (ref 8.4–10.5)
Creatinine, Ser: 0.87 mg/dL (ref 0.50–1.10)
GFR calc non Af Amer: 72 mL/min — ABNORMAL LOW (ref >90)
Glucose, Bld: 113 mg/dL — ABNORMAL HIGH (ref 70–99)

## 2011-09-29 LAB — GLUCOSE, CAPILLARY
Glucose-Capillary: 107 mg/dL — ABNORMAL HIGH (ref 70–99)
Glucose-Capillary: 113 mg/dL — ABNORMAL HIGH (ref 70–99)

## 2011-09-29 MED ORDER — RESOURCE THICKENUP CLEAR PO POWD
ORAL | Status: DC | PRN
  Filled 2011-09-29: qty 125

## 2011-09-29 MED ORDER — NAPROXEN 500 MG PO TABS
500.0000 mg | ORAL_TABLET | Freq: Two times a day (BID) | ORAL | Status: DC | PRN
  Administered 2011-09-29 – 2011-10-01 (×3): 500 mg via ORAL
  Filled 2011-09-29 (×3): qty 1

## 2011-09-29 MED ORDER — DIPHENHYDRAMINE HCL 12.5 MG/5ML PO ELIX
12.5000 mg | ORAL_SOLUTION | Freq: Four times a day (QID) | ORAL | Status: DC | PRN
  Administered 2011-09-29: 12.5 mg via ORAL
  Filled 2011-09-29 (×3): qty 5

## 2011-09-29 MED ORDER — PANTOPRAZOLE SODIUM 40 MG PO TBEC
40.0000 mg | DELAYED_RELEASE_TABLET | Freq: Every day | ORAL | Status: DC
  Administered 2011-09-29 – 2011-10-01 (×3): 40 mg via ORAL
  Filled 2011-09-29 (×3): qty 1

## 2011-09-29 MED ORDER — STARCH (THICKENING) PO POWD
ORAL | Status: DC | PRN
  Filled 2011-09-29 (×3): qty 227

## 2011-09-29 MED ORDER — SODIUM CHLORIDE 0.45 % IV SOLN
INTRAVENOUS | Status: DC
  Administered 2011-09-29: 08:00:00 via INTRAVENOUS

## 2011-09-29 NOTE — Progress Notes (Signed)
Report called to [facility], RN. Patient transferred to 3311 and placed on monitor with no complication, met RN at bedside.

## 2011-09-29 NOTE — Progress Notes (Signed)
[facility]  Daily Progress Note  Assessment/Planning: POD #2 s/p R CEA   D/C drain  Transfer to 3300  Continued dysphagia diet  Continue Plavix  Subjective  - 2 Days Post-Op  Tolerating some of dysphagia diet  Objective Filed Vitals:   09/29/11 0500 09/29/11 0600 09/29/11 0700 09/29/11 0728  BP: 155/67  100/40   Pulse: 95 83 76   Temp:    98.2 F (36.8 C)  TempSrc:    Oral  Resp: 19 25 19    Height:      Weight: 140 lb 10.5 oz (63.8 kg)     SpO2: 100% 99% 99%     Intake/Output Summary (Last 24 hours) at 09/29/11 0743 Last data filed at 09/29/11 0700  Gross per 24 hour  Intake   1090 ml  Output   2170 ml  Net  -1080 ml    PULM  CTAB CV  RRR GI  soft, NTND NECK  Inc c/d/i, no hematoma, JP in placed: 45 cc/day  Laboratory CBC    Component Value Date/Time   WBC 12.0* 09/28/2011 0400   HGB 11.0* 09/28/2011 0400   HCT 32.9* 09/28/2011 0400   PLT 190 09/28/2011 0400    BMET    Component Value Date/Time   NA 138 09/29/2011 0436   K 4.4 09/29/2011 0436   CL 106 09/29/2011 0436   CO2 24 09/29/2011 0436   GLUCOSE 113* 09/29/2011 0436   BUN 10 09/29/2011 0436   CREATININE 0.87 09/29/2011 0436   CALCIUM 8.8 09/29/2011 0436   GFRNONAA 72* 09/29/2011 0436   GFRAA 83* 09/29/2011 0436    Leonides Sake, MD [facility] Office: [phone] Pager: [phone]  09/29/2011, 7:43 AM

## 2011-09-30 LAB — GLUCOSE, CAPILLARY: Glucose-Capillary: 82 mg/dL (ref 70–99)

## 2011-09-30 MED ORDER — INSULIN ASPART 100 UNIT/ML ~~LOC~~ SOLN: 0.0000 [IU] | Freq: Three times a day (TID) | SUBCUTANEOUS | Status: DC

## 2011-09-30 NOTE — Progress Notes (Signed)
[facility]  Daily Progress Note  Assessment/Planning: POD #3 s/p R CEA   Transfer to 2000  Cont dysphagia diet  Neuro intact  Ok to discharge when ok with speech therapy and home speech therapy arranged  Subjective  - 3 Days Post-Op  Tolerating dysphagia diet without any resp complaints  Objective Filed Vitals:   09/29/11 1900 09/29/11 2300 09/30/11 0300 09/30/11 0800  BP: 144/66 120/49 153/97 153/75  Pulse: 86 90 75 78  Temp: 98.3 F (36.8 C) 98.3 F (36.8 C) 98.3 F (36.8 C) 98.7 F (37.1 C)  TempSrc: Oral Oral Oral Oral  Resp: 20 16 15 18   Height:      Weight:      SpO2: 100% 100% 100% 100%    Intake/Output Summary (Last 24 hours) at 09/30/11 0953 Last data filed at 09/30/11 0800  Gross per 24 hour  Intake   1785 ml  Output   2100 ml  Net   -315 ml    PULM  CTAB CV  RRR GI  soft, NTND VASC  R neck inc c/d/i NEURO tongue R deviation, M/S 5/5 sym  Laboratory CBC    Component Value Date/Time   WBC 12.0* 09/28/2011 0400   HGB 11.0* 09/28/2011 0400   HCT 32.9* 09/28/2011 0400   PLT 190 09/28/2011 0400    BMET    Component Value Date/Time   NA 138 09/29/2011 0436   K 4.4 09/29/2011 0436   CL 106 09/29/2011 0436   CO2 24 09/29/2011 0436   GLUCOSE 113* 09/29/2011 0436   BUN 10 09/29/2011 0436   CREATININE 0.87 09/29/2011 0436   CALCIUM 8.8 09/29/2011 0436   GFRNONAA 72* 09/29/2011 0436   GFRAA 83* 09/29/2011 0436    Leonides Sake, MD [facility] Office: [phone] Pager: [phone]  09/30/2011, 9:53 AM

## 2011-09-30 NOTE — Progress Notes (Signed)
Patient transferred to 2009. Report called to RN on 2000 and all questions answered. Patient's VVS and assessment WNL with the exception of patient's right-sided tongue deviation and difficulty swallowing (MD aware of both issues and they have not gotten any worse since surgery). Patient transferred via wheelchair with NT and family.

## 2011-10-01 LAB — GLUCOSE, CAPILLARY
Glucose-Capillary: 84 mg/dL (ref 70–99)
Glucose-Capillary: 101 mg/dL — ABNORMAL HIGH (ref 70–99)

## 2011-10-01 MED ORDER — CLOPIDOGREL BISULFATE 75 MG PO TABS: 75.0000 mg | ORAL_TABLET | Freq: Every day | ORAL | Status: DC

## 2011-10-01 MED ORDER — STARCH (THICKENING) PO POWD: ORAL | Status: DC

## 2011-10-01 MED ORDER — BISACODYL 5 MG PO TBEC
10.0000 mg | DELAYED_RELEASE_TABLET | Freq: Every day | ORAL | Status: DC | PRN
  Administered 2011-10-01: 10 mg via ORAL
  Filled 2011-10-01: qty 2

## 2011-10-01 MED ORDER — OXYCODONE-ACETAMINOPHEN 5-325 MG PO TABS: 1.0000 | ORAL_TABLET | Freq: Four times a day (QID) | ORAL | Status: AC | PRN

## 2011-10-01 NOTE — Progress Notes (Addendum)
[facility] Progress Note  10/01/2011 7:32 AM POD 4  Subjective:  Feels her swallowing is getting better.  States she thinks her tongue is a little better too.  Afebrile x 24 hrs   HR 70s-90s  Reg  100%RA Filed Vitals:   10/01/11 0550  BP: 126/72  Pulse: 77  Temp: 98.1 F (36.7 C)  Resp: 16     Physical Exam: Neuro:  Still with right tongue deviation, but otherwise, neuro is in tact. Incision:  C/d/i without drainage.   Drain site is clean without drainage.  CBC    Component Value Date/Time   WBC 12.0* 09/28/2011 0400   RBC 3.60* 09/28/2011 0400   HGB 11.0* 09/28/2011 0400   HCT 32.9* 09/28/2011 0400   PLT 190 09/28/2011 0400   MCV 91.4 09/28/2011 0400   MCH 30.6 09/28/2011 0400   MCHC 33.4 09/28/2011 0400   RDW 12.7 09/28/2011 0400   LYMPHSABS 2.2 08/24/2011 1108   MONOABS 0.5 08/24/2011 1108   EOSABS 0.3 08/24/2011 1108   BASOSABS 0.0 08/24/2011 1108    BMET    Component Value Date/Time   NA 138 09/29/2011 0436   K 4.4 09/29/2011 0436   CL 106 09/29/2011 0436   CO2 24 09/29/2011 0436   GLUCOSE 113* 09/29/2011 0436   BUN 10 09/29/2011 0436   CREATININE 0.87 09/29/2011 0436   CALCIUM 8.8 09/29/2011 0436   GFRNONAA 72* 09/29/2011 0436   GFRAA 83* 09/29/2011 0436     Intake/Output Summary (Last 24 hours) at 10/01/11 0732 Last data filed at 09/30/11 1800  Gross per 24 hour  Intake    300 ml  Output    600 ml  Net   -300 ml      Assessment/Plan:  This is a 61 y.o. female who is s/p right CEA POD 4  -if okay with speech tx and HH is set up for pt, she may be discharged today. -continue dysphagia diet  April Massed, PA-C [facility] [phone]  Addendum  I have independently interviewed and examined the patient, and I agree with the physician assistant's findings.  Ok from Vascular viewpoint to d/c on ok with Speech Path.  Leonides Sake, MD [facility] [place] Office: [phone] Pager: [phone]  10/01/2011, 3:41  PM

## 2011-10-01 NOTE — Plan of Care (Signed)
Problem: Phase III Progression Outcomes Goal: Pain controlled on oral analgesia Outcome: Progressing Episodes of headache but relieved with Naprosen  500mg . Will continue to monitor. Goal: Activity at appropriate level-compared to baseline (UP IN CHAIR FOR HEMODIALYSIS)  Outcome: Completed/Met Date Met:  10/01/11 Ambulates independently with a steady gait within room. Will progress activity to hallway with minimal assist. Goal: Voiding independently Outcome: Completed/Met Date Met:  10/01/11 Denies problems with voiding. Goal: IV changed to normal saline lock Outcome: Completed/Met Date Met:  10/01/11 Drinking enough to hold IV fluids. Goal: Demonstrates TCDB, IS independently Outcome: Completed/Met Date Met:  10/01/11 Effectively coughs and deep breathes. Understands use of IS.

## 2011-10-01 NOTE — Discharge Summary (Signed)
[facility] Discharge Summary  April Shaffer November 24, 1950 61 y.o. female  [id number]  Admission Date: 09/27/2011  Discharge Date: 10/02/11  Physician: Pryor Ochoa, MD  Admission Diagnosis: RIGHT ICA STENOSIS   HPI:   This is a 61 y.o. female was referred by Dr. Kerby Nora for evaluation of carotid occlusive disease. The patient recently had a physical exam and a bruit was heard. Carotid duplex exam was performed on July 10 which I have reviewed and reveals a 90% right internal carotid stenosis an approximate 60-70% left internal carotid stenosis. Patient denies any history of stroke, TIAs, lateralizing weakness, aphasia, visual loss, facial asymmetry, or other neurologic symptoms. She has never previously had a carotid duplex exam performed. She also denies a history of coronary artery disease or claudication.  Hospital Course:  The patient was admitted to the hospital and taken to the operating room on 09/27/2011 and underwent right carotid endarterectomy.  The pt tolerated the procedure well and was transported to the PACU in good condition.   By POD 1, the pt neuro status was in tact.  However, she did c/o difficulty swallowing and reported HA.  Per Dr. 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  Speech tx was consulted and originally recommended NPO, but she was  later advanced to a dysphagia diet and she has tolerated this well.    She was started on Plavix.  Her drain was removed without difficulty.  Speech therapy impression and recommendation, which has been discussed with the pt:  Pt continues to demonstrate evidence of neuromuscular deficits (tongue deviation, nasal resonance). SLP offered instruction, moderate verbal and visual cues for accurate implementation of compensatory swallow strategies. Despite this, pt continued to exhibit consistent sign of penetration/aspiration of thin liquids. Suggest pt continue nectar thick liquids at d/c with pt using her own discretion to decide best soft solid food choiced. Education pt regarding thickening liquids. She will need home health SLP and a repeat MBS in several weeks with functional improvement with swallow. Pt likely to d/c home today or tomorrow.  Diet Recommendation  Continue with Current Diet: Dysphagia 3 (mechanical soft);Nectar-thick liquid      The remainder of the hospital course consisted of increasing mobilization and increasing intake of solids without difficulty.    Basename 09/29/11 0436  NA 138  K 4.4  CL 106  CO2 24  GLUCOSE 113*  BUN 10  CALCIUM 8.8   No results found for this basename: WBC:2,HGB:2,HCT:2,PLT:2 in the last 72 hours No results found for this basename: INR:2 in the last 72 hours   Discharge Instructions:   The patient is discharged to home with extensive instructions on wound care and progressive ambulation.  They are instructed not to drive or perform any heavy lifting until returning to see the physician in his office.  Discharge Orders    Future Appointments: Provider: Department: Dept Phone: Center:  10/16/2011 1:00 PM Pryor Ochoa, MD [facility] [phone] [facility]     Future Orders Please Complete By Expires   Resume previous diet      Driving Restrictions      Comments:   No driving for 4 weeks   Lifting restrictions      Comments:   No  lifting for 6 weeks   Call MD for:  temperature >100.5      Call MD for:  redness, tenderness, or signs of infection (pain, swelling, bleeding, redness, odor or green/yellow discharge around incision site)      Call MD for:  severe or increased pain, loss or decreased feeling  in affected limb(s)      Increase activity slowly      Comments:   Walk with assistance use walker or cane as needed   May shower       Scheduling Instructions:   Saturday   may wash over wound with mild soap and water      No dressing needed      CAROTID Sugery: Call MD for difficulty swallowing or speaking; weakness in arms or legs that is a new symtom; severe headache.  If you have increased swelling in the neck and/or  are having difficulty breathing, CALL 911         Discharge Diagnosis:  RIGHT ICA STENOSIS  Secondary Diagnosis: Patient Active Problem List  Diagnosis  . DIABETES MELLITUS, TYPE II  . HYPERLIPIDEMIA  . UNSPECIFIED ANEMIA  . HYPERTENSION  . DEGENERATIVE DISC DISEASE, LUMBOSACRAL SPINE  . Gout of big toe  . Right hip pain  . Carotid bruit  . Occlusion and stenosis of carotid artery without mention of cerebral infarction   Past Medical History  Diagnosis Date  . Diabetes mellitus, type 2   . Hypertension   . Hyperlipidemia   . Sleep apnea     hx had tonsilectomy and no longer has   . Arthritis   . Carotid artery narrowing      April, Shaffer  Home Medication Instructions [id number]   Printed on:10/01/11 0749  Medication Information                    naproxen (NAPROSYN) 500 MG tablet Take 1 tablet (500 mg total) by mouth 2 (two) times daily as needed (pain). Take with food           diphenhydrAMINE (BENADRYL) 25 MG tablet Take 25 mg by mouth every 6 (six) hours as needed. For allergies           diclofenac sodium (VOLTAREN) 1 % GEL Apply 1 application topically daily. Usually on hands           colchicine 0.6 MG tablet Take 0.6 mg by mouth 2 (two) times daily as  needed. gout           hydrochlorothiazide (HYDRODIURIL) 25 MG tablet Take 25 mg by mouth daily.           metFORMIN (GLUCOPHAGE-XR) 750 MG 24 hr tablet Take 750 mg by mouth daily with breakfast.           clopidogrel (PLAVIX) 75 MG tablet Take 1 tablet (75 mg total) by mouth daily with breakfast.           food thickener (THICK IT) POWD Take as directed.           oxyCODONE-acetaminophen (PERCOCET/ROXICET) 5-325 MG per tablet Take 1-2 tablets by mouth every 6 (six) hours as  needed. #30 NR            Disposition: home  Patient's condition: is Good  Follow up: 1. Dr.  Hart Rochester in 2 weeks.   Doreatha Massed, PA-C [facility] [phone] 10/01/2011  7:49 AM

## 2011-10-01 NOTE — Progress Notes (Signed)
Speech Language Pathology Dysphagia Treatment Patient Details Name: April Shaffer MRN: [id number] DOB: Oct 01, 1950 Today's Date: 10/01/2011 Time: 1350-1410 SLP Time Calculation (min): 20 min  Assessment / Plan / Recommendation Clinical Impression  Pt continues to demonstrate evidence of neuromuscular deficits (tongue deviation, nasal resonance). SLP offered instruction, moderate verbal and visual cues for accurate implementation of compensatory swallow strategies. Despite this, pt continued to exhibit consistent sign of penetration/aspiration of thin liquids. Suggest pt continue nectar thick liquids at d/c with pt using her own discretion to decide best soft solid food choiced. Education pt regarding thickening liquids. She will need home health SLP and a repeat MBS in several weeks with functional improvement with swallow. Pt likely to d/c home today or tomorrow.     Diet Recommendation  Continue with Current Diet: Dysphagia 3 (mechanical soft);Nectar-thick liquid    SLP Plan Discharge SLP treatment due to (comment) (D/c home, education complete)   Pertinent Vitals/Pain NA   Swallowing Goals  SLP Swallowing Goals Patient will consume recommended diet without observed clinical signs of aspiration with: Independent assistance Swallow Study Goal #1 - Progress: Met Patient will utilize recommended strategies during swallow to increase swallowing safety with: Independent assistance Swallow Study Goal #2 - Progress: Met  General Temperature Spikes Noted: No Respiratory Status: Room air Behavior/Cognition: Alert;Cooperative Oral Cavity - Dentition: Missing dentition Patient Positioning: Upright in chair  Oral Cavity - Oral Hygiene Does patient have any of the following "at risk" factors?: None of the above   Dysphagia Treatment Treatment focused on: Skilled observation of diet tolerance;Upgraded PO texture trials;Patient/family/caregiver education;Utilization of compensatory  strategies Treatment Methods/Modalities: Skilled observation Patient observed directly with PO's: Yes Type of PO's observed: Thin liquids Feeding: Able to feed self Liquids provided via: Cup Pharyngeal Phase Signs & Symptoms: Multiple swallows;Immediate throat clear;Complaints of residue Type of cueing: Verbal;Visual Amount of cueing: Moderate   GO     April Shaffer, [name] 10/01/2011, 3:18 PM

## 2011-10-02 NOTE — Progress Notes (Signed)
Discharge orders received. IV d/c site WNL. Patient education completed. D/C home with family. April Shaffer, April Shaffer

## 2011-10-02 NOTE — Plan of Care (Signed)
Problem: Phase III Progression Outcomes Goal: Pain controlled on oral analgesia Outcome: Completed/Met Date Met:  10/02/11 Naprosyn seem to control pt's pain level with no side effects. Will continue with current regimen. Goal: Nasogastric tube discontinued Outcome: Completed/Met Date Met:  10/02/11 Pt does not currently have an NG tube. Goal: Discharge plan remains appropriate-arrangements made Outcome: Completed/Met Date Met:  10/02/11 Pt was to be discharged 8/5 but no MD to DC/ cleared by PA but awaiting for MD order. Attempted to page MD but no answer.

## 2011-10-02 NOTE — Progress Notes (Signed)
[facility] Progress Note  10/02/2011 7:46 AM POD 5  Subjective:  No complaints.  States she feels her swallowing is slightly improved.  Afebrile x 24 hrs  99%RA Filed Vitals:   10/02/11 0504  BP: 120/70  Pulse: 79  Temp: 98.9 F (37.2 C)  Resp: 18     Physical Exam: Neuro:  In tact.  Still with some right tongue deviation.   Incision:  C/d/i.  CBC    Component Value Date/Time   WBC 12.0* 09/28/2011 0400   RBC 3.60* 09/28/2011 0400   HGB 11.0* 09/28/2011 0400   HCT 32.9* 09/28/2011 0400   PLT 190 09/28/2011 0400   MCV 91.4 09/28/2011 0400   MCH 30.6 09/28/2011 0400   MCHC 33.4 09/28/2011 0400   RDW 12.7 09/28/2011 0400   LYMPHSABS 2.2 08/24/2011 1108   MONOABS 0.5 08/24/2011 1108   EOSABS 0.3 08/24/2011 1108   BASOSABS 0.0 08/24/2011 1108    BMET    Component Value Date/Time   NA 138 09/29/2011 0436   K 4.4 09/29/2011 0436   CL 106 09/29/2011 0436   CO2 24 09/29/2011 0436   GLUCOSE 113* 09/29/2011 0436   BUN 10 09/29/2011 0436   CREATININE 0.87 09/29/2011 0436   CALCIUM 8.8 09/29/2011 0436   GFRNONAA 72* 09/29/2011 0436   GFRAA 83* 09/29/2011 0436    No intake or output data in the 24 hours ending 10/02/11 0746    Assessment/Plan:  This is a 61 y.o. female who is s/p right CEA POD 5  -speech tx eval yesterday and education provided to pt.  She will call and make appt with them in 2 weeks for MBS. -she has appt with Dr. Hart Rochester on 8/20 -d/c home today.  Doreatha Massed, PA-C [facility] [phone]

## 2011-10-15 ENCOUNTER — Encounter: Payer: Self-pay | Admitting: Vascular Surgery

## 2011-10-16 ENCOUNTER — Ambulatory Visit (INDEPENDENT_AMBULATORY_CARE_PROVIDER_SITE_OTHER): Payer: 59 | Admitting: Vascular Surgery

## 2011-10-16 ENCOUNTER — Encounter: Payer: Self-pay | Admitting: Vascular Surgery

## 2011-10-16 ENCOUNTER — Other Ambulatory Visit (INDEPENDENT_AMBULATORY_CARE_PROVIDER_SITE_OTHER): Payer: 59

## 2011-10-16 VITALS — BP 186/79 | HR 82 | Resp 16 | Ht 64.5 in | Wt 134.0 lb

## 2011-10-16 DIAGNOSIS — Z48812 Encounter for surgical aftercare following surgery on the circulatory system: Secondary | ICD-10-CM

## 2011-10-16 DIAGNOSIS — I6529 Occlusion and stenosis of unspecified carotid artery: Secondary | ICD-10-CM

## 2011-10-16 NOTE — Addendum Note (Signed)
Addended by: Sharee Pimple on: 10/16/2011 04:15 PM   Modules accepted: Orders

## 2011-10-16 NOTE — Progress Notes (Signed)
Subjective:     Patient ID: April Shaffer, female   DOB: 07/21/1950, 61 y.o.   MRN: [id number]  HPI this 61 year old female returns today 3 weeks post right carotid endarterectomy with Dacron patch angioplasty. Patient had a greater than 95% stenosis and was found at the time of surgery to have a very small internal carotid artery with plaque extending to the base of the skull. Technically it was a very difficult procedure and I was of the opinion that the right ICA he did not study patent following the surgery because of the small caliber in the extent of the disease. He had very poor backbleeding coming from the distal vessel at the time of surgery. She did not develop any neurologic deficits other than some dysphasia because of the distal extent of the dissection. She also has some hypoglossal nerve weakness because of retraction on the nerve during the surgical procedure. Currently she denies any specific neurologic symptoms and states her swallowing is improving.  Past Medical History  Diagnosis Date  . Diabetes mellitus, type 2   . Hypertension   . Hyperlipidemia   . Sleep apnea     hx had tonsilectomy and no longer has   . Arthritis   . Carotid artery narrowing     History  Substance Use Topics  . Smoking status: Current Some Day Smoker -- 0.2 packs/day for 25 years    Types: Cigarettes  . Smokeless tobacco: Never Used   Comment: 1 pack every 2-3 days  . Alcohol Use: 1.8 oz/week    3 Shots of liquor per week     2 drinks a day    Family History  Problem Relation Age of Onset  . Hypertension Sister   . Hypothyroidism Sister   . Cancer Maternal Grandmother     lung    Allergies  Allergen Reactions  . Lisinopril-Hydrochlorothiazide Other (See Comments)    angioedema  . Codeine Itching  . Tramadol Itching    Current outpatient prescriptions:clopidogrel (PLAVIX) 75 MG tablet, Take 1 tablet (75 mg total) by mouth daily with breakfast., Disp: 30 tablet, Rfl: 6;  colchicine  0.6 MG tablet, Take 0.6 mg by mouth 2 (two) times daily as needed. gout, Disp: , Rfl: ;  diclofenac sodium (VOLTAREN) 1 % GEL, Apply 1 application topically daily. Usually on hands, Disp: , Rfl:  diphenhydrAMINE (BENADRYL) 25 MG tablet, Take 25 mg by mouth every 6 (six) hours as needed. For allergies, Disp: , Rfl: ;  food thickener (THICK IT) POWD, Take as directed., Disp: , Rfl: ;  hydrochlorothiazide (HYDRODIURIL) 25 MG tablet, Take 25 mg by mouth daily., Disp: , Rfl: ;  metFORMIN (GLUCOPHAGE-XR) 750 MG 24 hr tablet, Take 750 mg by mouth daily with breakfast., Disp: , Rfl:  naproxen (NAPROSYN) 500 MG tablet, Take 1 tablet (500 mg total) by mouth 2 (two) times daily as needed (pain). Take with food, Disp: 40 tablet, Rfl: 0;  oxyCODONE (ROXICODONE) 15 MG immediate release tablet, Take 15 mg by mouth every 4 (four) hours as needed., Disp: , Rfl:   BP 186/79  Pulse 82  Resp 16  Ht 5' 4.5" (1.638 m)  Wt 134 lb (60.782 kg)  BMI 22.65 kg/m2  Body mass index is 22.65 kg/(m^2).           Review of Systems     Objective:   Physical Exam blood pressure 186/79 heart rate 82 respirations 16 Right neck incision healing nicely no evidence of infection 3+ carotid  pulse on the left with high pitch bruit 2+ carotid pulse on the right no bruit audible Neurologic exam mild right hypoglossal nerve paresis.  Today I ordered a carotid duplex exam of the right side to confirm occlusion of the right ICA. Duplex did confirm this finding with no flow in the ICA on the right. Left side was not studied today.    Assessment:     Doing well post extensive right carotid endarterectomy with distal plaque to base of skull and subsequent thrombosis right ICA with no neurologic deficits Known 70% left ICA stenosis Tobacco abuse patient trying to stop smoking completely    Plan:    patient to return in 6 months with carotid duplex exam to see severity of left ICA stenosis. May need to obtain CT angiogram if  surgery contemplated to determine distal extent of plaque. If patient develops any new neurologic symptoms she will be in touch with [place] and will continue working on smoking cessation

## 2011-10-18 ENCOUNTER — Ambulatory Visit: Payer: 59 | Attending: Vascular Surgery | Admitting: *Deleted

## 2011-10-18 DIAGNOSIS — IMO0001 Reserved for inherently not codable concepts without codable children: Secondary | ICD-10-CM | POA: Insufficient documentation

## 2011-10-18 DIAGNOSIS — R1313 Dysphagia, pharyngeal phase: Secondary | ICD-10-CM | POA: Insufficient documentation

## 2011-10-26 ENCOUNTER — Ambulatory Visit (HOSPITAL_COMMUNITY): Admission: RE | Admit: 2011-10-26 | Payer: 59 | Source: Ambulatory Visit

## 2011-10-26 ENCOUNTER — Other Ambulatory Visit (HOSPITAL_COMMUNITY): Payer: 59

## 2011-11-08 ENCOUNTER — Other Ambulatory Visit: Payer: Self-pay | Admitting: Vascular Surgery

## 2011-11-08 DIAGNOSIS — R970 Elevated carcinoembryonic antigen [CEA]: Secondary | ICD-10-CM

## 2011-11-16 ENCOUNTER — Other Ambulatory Visit (HOSPITAL_COMMUNITY): Payer: 59

## 2011-11-16 ENCOUNTER — Ambulatory Visit (HOSPITAL_COMMUNITY)
Admission: RE | Admit: 2011-11-16 | Discharge: 2011-11-16 | Disposition: A | Discharge status: Home / Self Care | Payer: 59 | Source: Ambulatory Visit | Attending: Vascular Surgery | Admitting: Vascular Surgery

## 2011-11-16 DIAGNOSIS — E785 Hyperlipidemia, unspecified: Secondary | ICD-10-CM | POA: Insufficient documentation

## 2011-11-16 DIAGNOSIS — R1319 Other dysphagia: Secondary | ICD-10-CM | POA: Insufficient documentation

## 2011-11-16 DIAGNOSIS — I10 Essential (primary) hypertension: Secondary | ICD-10-CM | POA: Insufficient documentation

## 2011-11-16 DIAGNOSIS — E119 Type 2 diabetes mellitus without complications: Secondary | ICD-10-CM | POA: Insufficient documentation

## 2011-11-16 DIAGNOSIS — R970 Elevated carcinoembryonic antigen [CEA]: Secondary | ICD-10-CM

## 2011-11-16 NOTE — Procedures (Signed)
Objective Swallowing Evaluation: Modified Barium Swallowing Study  Patient Details  Name: April Shaffer MRN: [id number] Date of Birth: 1950-09-19  Today's Date: 11/16/2011 Time: 0910-0930 SLP Time Calculation (min): 20 min  Past Medical History:  Past Medical History  Diagnosis Date  . Diabetes mellitus, type 2   . Hypertension   . Hyperlipidemia   . Sleep apnea     hx had tonsilectomy and no longer has   . Arthritis   . Carotid artery narrowing    Past Surgical History:  Past Surgical History  Procedure Date  . Varicose vein surgery 2008    stripping  . Gyn surgery 2004    total hysterectomy for mennorhagia,,salpingoophorectomy  . Epidural block injection 02/2008    Drs. Dorthula Nettles  . Tonsillectomy   . Endarterectomy 09/27/2011    Procedure: ENDARTERECTOMY CAROTID;  Surgeon: Pryor Ochoa, MD;  Location: [facility] OR;  Service: Vascular;  Laterality: Right;   HPI:  Pt is a 61 year old female admitted in early august of this year for a right carotid endarterectomy. Post procedure pt experienced dysphagia characterized by edema, pharyngeal/laryngeal weakness, complicated by reduced UES opening from cervical osteophyte. Pt with silent aspiration of thin liquids. Pt returns for a repeat, outpatient MBS with subjective report of improved swallow function.      Assessment / Plan / Recommendation Clinical Impression  Dysphagia Diagnosis: Mild cervical esophageal phase dysphagia;Mild pharyngeal phase dysphagia Clinical impression: Pt presents with much improved swalllow function since previous MBS with increased pharyngeal strength and reduced edema. Mild pharyngeal/cervical esophageal dysphagia persists with reduced opening of UES and resulting mild pyriform sinus and CP residuals due to continue presence of cervical osteophyte. Residuals clear with  a second/third swallow. Pt with one episode of silent penetration when taking large sips with pills. At this time pt is recommended  to continue a regular diet with thin liquids following basic compensatory strategies. No SLP f/u needed at this time.     Treatment Recommendation  No treatment recommended at this time    Diet Recommendation Regular;Thin liquid   Liquid Administration via: Cup;Straw Medication Administration: Whole meds with puree Supervision: Patient able to self feed Compensations: Small sips/bites;Multiple dry swallows after each bite/sip    Other  Recommendations     Follow Up Recommendations  None    Frequency and Duration        Pertinent Vitals/Pain NA    SLP Swallow Goals     General HPI: Pt is a 61 year old female admitted in early august of this year for a right carotid endarterectomy. Post procedure pt experienced dysphagia characterized by edema, pharyngeal/laryngeal weakness, complicated by reduced UES opening from cervical osteophyte. Pt with silent aspiration of thin liquids. Pt returns for a repeat, outpatient MBS with subjective report of improved swallow function.  Type of Study: Modified Barium Swallowing Study Reason for Referral: Objectively evaluate swallowing function Previous Swallow Assessment: 09/28/11 - Dys 1 nectar Diet Prior to this Study: Regular;Thin liquids Behavior/Cognition: Alert;Cooperative;Pleasant mood Oral Cavity - Dentition: Adequate natural dentition Oral Motor / Sensory Function: Within functional limits Self-Feeding Abilities: Able to feed self Patient Positioning: Upright in bed Baseline Vocal Quality: Clear Volitional Cough: Strong Volitional Swallow: Able to elicit Anatomy: Other (Comment) (Cervical osteophyte at C5/6) Pharyngeal Secretions: Not observed secondary MBS    Reason for Referral Objectively evaluate swallowing function   Oral Phase Oral Preparation/Oral Phase Oral Phase: WFL   Pharyngeal Phase Pharyngeal Phase Pharyngeal Phase: Impaired Pharyngeal - Thin Pharyngeal -  Thin Cup: Pharyngeal residue - pyriform sinuses Pharyngeal -  Thin Straw: Pharyngeal residue - pyriform sinuses Pharyngeal - Solids Pharyngeal - Puree: Pharyngeal residue - pyriform sinuses Pharyngeal - Regular: Pharyngeal residue - pyriform sinuses Pharyngeal - Pill: Penetration/Aspiration before swallow;Delayed swallow initiation Penetration/Aspiration details (pill): Material enters airway, remains ABOVE vocal cords and not ejected out  Cervical Esophageal Phase    GO    Cervical Esophageal Phase Cervical Esophageal Phase: Impaired Cervical Esophageal Phase - Thin Thin Cup: Reduced cricopharyngeal relaxation;Other (Comment) (residual in CP segment) Thin Straw: Reduced cricopharyngeal relaxation;Other (Comment) (residual in CP segment) Cervical Esophageal Phase - Solids Puree: Other (Comment);Reduced cricopharyngeal relaxation (residual in CP segment) Mechanical Soft: Reduced cricopharyngeal relaxation;Other (Comment) (residual in CP segment)    Functional Assessment Tool Used: clinical judgement Functional Limitations: Swallowing Swallow Current Status (Z6109): At least 1 percent but less than 20 percent impaired, limited or restricted Swallow Discharge Status [id number]): At least 1 percent but less than 20 percent impaired, limited or restricted    Lovell Roe, [name] 11/16/2011, 9:49 AM

## 2011-11-30 ENCOUNTER — Other Ambulatory Visit: Payer: Self-pay | Admitting: Family Medicine

## 2011-11-30 ENCOUNTER — Other Ambulatory Visit: Payer: Self-pay | Admitting: *Deleted

## 2011-11-30 MED ORDER — HYDROCHLOROTHIAZIDE 25 MG PO TABS: 25.0000 mg | ORAL_TABLET | Freq: Every day | ORAL | Status: DC

## 2011-12-03 ENCOUNTER — Other Ambulatory Visit: Payer: Self-pay | Admitting: Family Medicine

## 2011-12-03 DIAGNOSIS — Z1231 Encounter for screening mammogram for malignant neoplasm of breast: Secondary | ICD-10-CM

## 2011-12-06 ENCOUNTER — Ambulatory Visit (HOSPITAL_COMMUNITY)
Admission: RE | Admit: 2011-12-06 | Discharge: 2011-12-06 | Disposition: A | Discharge status: Home / Self Care | Payer: 59 | Source: Ambulatory Visit | Attending: Family Medicine | Admitting: Family Medicine

## 2011-12-06 DIAGNOSIS — Z1231 Encounter for screening mammogram for malignant neoplasm of breast: Secondary | ICD-10-CM

## 2011-12-07 ENCOUNTER — Encounter: Payer: Self-pay | Admitting: Family Medicine

## 2011-12-07 ENCOUNTER — Other Ambulatory Visit (INDEPENDENT_AMBULATORY_CARE_PROVIDER_SITE_OTHER): Payer: 59

## 2011-12-07 DIAGNOSIS — E785 Hyperlipidemia, unspecified: Secondary | ICD-10-CM

## 2011-12-07 DIAGNOSIS — E119 Type 2 diabetes mellitus without complications: Secondary | ICD-10-CM

## 2011-12-07 LAB — HM MAMMOGRAPHY: HM Mammogram: NORMAL

## 2011-12-07 LAB — LIPID PANEL
LDL Cholesterol: 119 mg/dL — ABNORMAL HIGH (ref 0–99)
VLDL: 17.8 mg/dL (ref 0.0–40.0)

## 2011-12-11 ENCOUNTER — Encounter: Payer: Self-pay | Admitting: Family Medicine

## 2011-12-11 ENCOUNTER — Ambulatory Visit (INDEPENDENT_AMBULATORY_CARE_PROVIDER_SITE_OTHER): Payer: 59 | Admitting: Family Medicine

## 2011-12-11 VITALS — BP 144/74 | HR 72 | Temp 98.2°F | Wt 140.2 lb

## 2011-12-11 DIAGNOSIS — I6529 Occlusion and stenosis of unspecified carotid artery: Secondary | ICD-10-CM

## 2011-12-11 DIAGNOSIS — E119 Type 2 diabetes mellitus without complications: Secondary | ICD-10-CM

## 2011-12-11 DIAGNOSIS — E785 Hyperlipidemia, unspecified: Secondary | ICD-10-CM

## 2011-12-11 DIAGNOSIS — I10 Essential (primary) hypertension: Secondary | ICD-10-CM

## 2011-12-11 DIAGNOSIS — Z136 Encounter for screening for cardiovascular disorders: Secondary | ICD-10-CM

## 2011-12-11 MED ORDER — COLESEVELAM HCL 625 MG PO TABS: 1875.0000 mg | ORAL_TABLET | Freq: Two times a day (BID) | ORAL | Status: DC

## 2011-12-11 NOTE — Progress Notes (Signed)
Subjective:    Patient ID: April Shaffer, female    DOB: [birth year], 61 y.o.   MRN: [id number]  HPI Had CEA in 08/2011... Had complication with swallowing after  Surgery. improving per barium swallow and pt report.  Diabetes: Great control on glucophage alone. Lab Results  Component Value Date   HGBA1C 5.7 12/07/2011  Using medications without difficulties:  Hypoglycemic episodes:none  Hyperglycemic episodes:none  Feet problems:none  Blood Sugars averaging: 90s Wt Readings from Last 3 Encounters:  12/11/11 140 lb 4 oz (63.617 kg)  10/16/11 134 lb (60.782 kg)  10/02/11 136 lb 6.4 oz (61.871 kg)   Eye exam: due. Has had flu shot.  Hypertension: Previously well controlled on HCTZ (just took Today), had angioedema with lisinopril  Using medication without problems or lightheadedness:  Chest pain with exertion:None  Edema:None  Short of breath:None  Average home BPs: not checking lately.    Elevated Cholesterol:LDL not at goal<70 (new goal with carotid stenosis) on red yeast rice, but improved from last check. Had SE to crestor and lipitor.  Has been on red yeast rice for last 4 months... Lab Results  Component Value Date   CHOL 199 12/07/2011   HDL 62.70 12/07/2011   LDLCALC 119* 12/07/2011   LDLDIRECT 127.3 08/24/2011   TRIG 89.0 12/07/2011   CHOLHDL 3 12/07/2011   Diet: Moderate  Exercise: walking a lot.  Other complaints:   She is working on quitting smoking. 3-4 cigs a day.    Review of Systems  Constitutional: Negative for fever and fatigue.  HENT: Negative for ear pain.   Eyes: Negative for pain.  Respiratory: Negative for chest tightness and shortness of breath.   Cardiovascular: Negative for chest pain, palpitations and leg swelling.  Gastrointestinal: Negative for abdominal pain.  Genitourinary: Negative for dysuria.       Objective:   Physical Exam  Constitutional: Vital signs are normal. She appears well-developed and well-nourished. She is  cooperative.  Non-toxic appearance. She does not appear ill. No distress.  HENT:  Head: Normocephalic.  Right Ear: Hearing, tympanic membrane, external ear and ear canal normal. Tympanic membrane is not erythematous, not retracted and not bulging.  Left Ear: Hearing, tympanic membrane, external ear and ear canal normal. Tympanic membrane is not erythematous, not retracted and not bulging.  Nose: No mucosal edema or rhinorrhea. Right sinus exhibits no maxillary sinus tenderness and no frontal sinus tenderness. Left sinus exhibits no maxillary sinus tenderness and no frontal sinus tenderness.  Mouth/Throat: Uvula is midline, oropharynx is clear and moist and mucous membranes are normal.  Eyes: Conjunctivae normal, EOM and lids are normal. Pupils are equal, round, and reactive to light. No foreign bodies found.  Neck: Trachea normal and normal range of motion. Neck supple. Carotid bruit is not present. No mass and no thyromegaly present.  Cardiovascular: Normal rate, regular rhythm, S1 normal, S2 normal, normal heart sounds, intact distal pulses and normal pulses.  Exam reveals no gallop and no friction rub.   No murmur heard.      Faint carotid bruit left, healing scar on right  Pulmonary/Chest: Effort normal and breath sounds normal. Not tachypneic. No respiratory distress. She has no decreased breath sounds. She has no wheezes. She has no rhonchi. She has no rales.  Abdominal: Soft. Normal appearance and bowel sounds are normal. There is no tenderness.  Neurological: She is alert.  Skin: Skin is warm, dry and intact. No rash noted.  Psychiatric: Her speech is normal and behavior is  normal. Judgment and thought content normal. Her mood appears not anxious. Cognition and memory are normal. She does not exhibit a depressed mood.    Diabetic foot exam: Normal inspection No skin breakdown No calluses  Normal DP pulses Normal sensation to light touch and monofilament Nails normal         Assessment & Plan:

## 2011-12-11 NOTE — Assessment & Plan Note (Signed)
Excellent control on glucophage alone

## 2011-12-11 NOTE — Assessment & Plan Note (Signed)
Not at goal on red yeast rice alone. Add welchol. Encouraged exercise, weight loss, healthy eating habits. Recheck in 3 months.

## 2011-12-11 NOTE — Patient Instructions (Addendum)
Follow BP at home... <130/80. Quit smoking! Continue red yeast rice, but add welchol to regimen. Make appt for eye exam. Stop at front desk to set up cardiac exercise stress test.

## 2011-12-11 NOTE — Assessment & Plan Note (Signed)
Follow at home given slight elevation today. May be high given just took med.

## 2011-12-11 NOTE — Assessment & Plan Note (Signed)
Given stenosis and other rsik factors.. Recommend referral for stress test of heart although asymptomatic. No claudication symptoms.

## 2011-12-13 ENCOUNTER — Other Ambulatory Visit: Payer: Self-pay | Admitting: Family Medicine

## 2011-12-14 ENCOUNTER — Other Ambulatory Visit: Payer: Self-pay | Admitting: *Deleted

## 2011-12-14 MED ORDER — NAPROXEN 500 MG PO TABS: 500.0000 mg | ORAL_TABLET | Freq: Two times a day (BID) | ORAL | Status: DC | PRN

## 2011-12-17 ENCOUNTER — Encounter: Payer: 59 | Admitting: Cardiovascular Disease

## 2011-12-18 ENCOUNTER — Ambulatory Visit (INDEPENDENT_AMBULATORY_CARE_PROVIDER_SITE_OTHER): Payer: 59 | Admitting: Cardiovascular Disease

## 2011-12-18 ENCOUNTER — Encounter: Payer: Self-pay | Admitting: Cardiovascular Disease

## 2011-12-18 VITALS — BP 146/80 | HR 74 | Ht 64.5 in | Wt 142.5 lb

## 2011-12-18 DIAGNOSIS — Z136 Encounter for screening for cardiovascular disorders: Secondary | ICD-10-CM

## 2011-12-18 DIAGNOSIS — E785 Hyperlipidemia, unspecified: Secondary | ICD-10-CM

## 2011-12-18 DIAGNOSIS — E119 Type 2 diabetes mellitus without complications: Secondary | ICD-10-CM

## 2011-12-18 DIAGNOSIS — I6529 Occlusion and stenosis of unspecified carotid artery: Secondary | ICD-10-CM

## 2011-12-18 DIAGNOSIS — R0600 Dyspnea, unspecified: Secondary | ICD-10-CM

## 2011-12-18 NOTE — Patient Instructions (Addendum)
Your treadmill stress test was abnormal.   Your physician has requested that you have en exercise stress myoview. For further information please visit [email or web address] Please follow instruction sheet, as given.  Follow up with me after stress test.

## 2011-12-18 NOTE — Procedures (Signed)
    Treadmill Stress test  Indication: Atherosclerosis with multiple risk factors for coronary artery disease.  Baseline Data:  Resting EKG shows NSR with rate of 74 bpm, no significant ST or T wave changes.  Resting blood pressure of 146/80 mm Hg Stand bruce protocal was used.  Exercise Data:  Patient exercised for 3 min 0 sec,  Peak heart rate of 159 bpm.  This was 99 % of the maximum predicted heart rate. No symptoms of chest pain or lightheadedness were reported at peak stress or in recovery. She did have mild dyspnea. Peak Blood pressure recorded was 184/74 Maximal work level: 4.6 METs.  Heart rate at 3 minutes in recovery was 105 bpm. BP response: Hypertensive HR response: Accelerated  EKG with Exercise: Sinus tachycardia with 1.5 mm of horizontal ST depression in the inferior leads as well as V5 and V6. These changes persisted for 5 minutes into recovery. PVCs were also noted in recovery.  FINAL IMPRESSION: Abnormal exercise stress test with evidence of exercise-induced ischemia and frequent PVCs in recovery. Poor exercise tolerance. The patient was overall asymptomatic other than mild dyspnea.  Recommendation: Given that she had no major symptoms during the test and the fact that this might be a false-positive treadmill stress test, I recommend a treadmill nuclear stress test for further evaluation before considering invasive cardiac evaluation.

## 2011-12-31 ENCOUNTER — Ambulatory Visit (HOSPITAL_COMMUNITY): Payer: 59 | Attending: Cardiology | Admitting: Radiology

## 2011-12-31 VITALS — BP 149/86 | Ht 64.5 in | Wt 140.0 lb

## 2011-12-31 DIAGNOSIS — I10 Essential (primary) hypertension: Secondary | ICD-10-CM | POA: Insufficient documentation

## 2011-12-31 DIAGNOSIS — R0989 Other specified symptoms and signs involving the circulatory and respiratory systems: Secondary | ICD-10-CM | POA: Insufficient documentation

## 2011-12-31 DIAGNOSIS — R9431 Abnormal electrocardiogram [ECG] [EKG]: Secondary | ICD-10-CM

## 2011-12-31 DIAGNOSIS — R0609 Other forms of dyspnea: Secondary | ICD-10-CM | POA: Insufficient documentation

## 2011-12-31 DIAGNOSIS — E119 Type 2 diabetes mellitus without complications: Secondary | ICD-10-CM | POA: Insufficient documentation

## 2011-12-31 DIAGNOSIS — R0600 Dyspnea, unspecified: Secondary | ICD-10-CM

## 2011-12-31 DIAGNOSIS — F17200 Nicotine dependence, unspecified, uncomplicated: Secondary | ICD-10-CM | POA: Insufficient documentation

## 2011-12-31 DIAGNOSIS — R002 Palpitations: Secondary | ICD-10-CM | POA: Insufficient documentation

## 2011-12-31 MED ORDER — TECHNETIUM TC 99M SESTAMIBI GENERIC - CARDIOLITE
11.0000 | Freq: Once | INTRAVENOUS | Status: AC | PRN
  Administered 2011-12-31: 11 via INTRAVENOUS

## 2011-12-31 MED ORDER — TECHNETIUM TC 99M SESTAMIBI GENERIC - CARDIOLITE
33.0000 | Freq: Once | INTRAVENOUS | Status: AC | PRN
  Administered 2011-12-31: 33 via INTRAVENOUS

## 2011-12-31 NOTE — Progress Notes (Signed)
[facility] SITE 3 NUCLEAR MED [address] [id number] [place] [address] [phone]  Cardiology Nuclear Med Study  April Shaffer is a 61 y.o. female     MRN : [id number]     DOB: 01-Aug-1950  Procedure Date: 12/31/2011  Nuclear Med Background Indication for Stress Test:  Evaluation for Ischemia, and Abnormal GXT on 12-18-11 History:  12/18/11 GXT: Abnormal exercise induced ischemia with freq PVCS Cardiac Risk Factors: Carotid Disease, Hypertension, Lipids, NIDDM and Smoker  Symptoms:  DOE and Palpitations   Nuclear Pre-Procedure Caffeine/Decaff Intake:  None > 1 2hrs NPO After: 9:30pm   Lungs:  clear O2 Sat: 100% on room air. IV 0.9% NS with Angio Cath:  22g  IV Site: R Forearm x 1, tolerated well IV Started by:  Irean Hong, RN  Chest Size (in):  40 Cup Size: C  Height: 5' 4.5" (1.638 m)  Weight:  140 lb (63.504 kg)  BMI:  Body mass index is 23.66 kg/(m^2). Tech Comments:  No metformin today. This patient walked on the treadmill and had sob with (+) changes. Pictures checked, they were (+). DOD C.McAlhany consulted and he stated she could go home and follow up with M.Ardia on Friday November 8th. I advised her to go to the ED if she had any problems until then.    Nuclear Med Study 1 or 2 day study: 1 day  Stress Test Type:  Stress  Reading MD: Olga Millers, MD  Order Authorizing Provider:  Lorine Bears, MD  Resting Radionuclide: Technetium 73m Sestamibi  Resting Radionuclide Dose: 11.0 mCi   Stress Radionuclide:  Technetium 31m Sestamibi  Stress Radionuclide Dose: 33.0 mCi           Stress Protocol Rest HR: 71 Stress HR: 155  Rest BP: 149/86 Stress BP: 218/91  Exercise Time (min): 4:00 METS: 5.80   Predicted Max HR: 161 bpm % Max HR: 96.27 bpm Rate Pressure Product: 13244   Dose of Adenosine (mg):  n/a Dose of Lexiscan: n/a mg  Dose of Atropine (mg): n/a Dose of Dobutamine: n/a mcg/kg/min (at max HR)  Stress Test Technologist:  Milana Na, EMT-P  Nuclear Technologist:  Domenic Polite, CNMT     Rest Procedure:  Myocardial perfusion imaging was performed at rest 45 minutes following the intravenous administration of Technetium 11m Sestamibi. Rest ECG: NSR - Normal EKG  Stress Procedure:  The patient performed treadmill exercise using a Bruce  Protocol for 4:00 minutes. The patient stopped due to doe,fatigue, and denied any chest pain.  There were + significant ST-T wave changes and rare pacs pvcs.  Technetium 69m Sestamibi was injected at peak exercise and myocardial perfusion imaging was performed after a brief delay. Stress ECG: Significant ST abnormalities consistent with ischemia.  QPS Raw Data Images:  Acquisition technically good; normal left ventricular size. Stress Images:  There is decreased uptake in the distal anterior wall/apex. Rest Images:  There is normal perfusion. Subtraction (SDS):  These findings are consistent with ischemia. Transient Ischemic Dilatation (Normal <1.22):  1.05 Lung/Heart Ratio (Normal <0.45):  0.37  Quantitative Gated Spect Images QGS EDV:  63 ml QGS ESV:  17 ml  Impression Exercise Capacity:  Poor exercise capacity. BP Response:  Hypertensive blood pressure response. Clinical Symptoms:  There is dyspnea. ECG Impression:  Significant ST abnormalities consistent with ischemia. Comparison with Prior Nuclear Study: No images to compare  Overall Impression:  Abnormal stress nuclear study with a small, severe intensity, reversible distal anterior wall/apical  defect consistent with mild ischemia.  LV Ejection Fraction: 73%.  LV Wall Motion:  NL LV Function; NL Wall Motion  Olga Millers

## 2012-01-03 ENCOUNTER — Telehealth: Payer: Self-pay | Admitting: Cardiovascular Disease

## 2012-01-03 NOTE — Telephone Encounter (Signed)
Pt returning nurse call for test results. Please call mobile #

## 2012-01-03 NOTE — Telephone Encounter (Signed)
Pt informed. Understanding verb. Confirms appt with Dr. Kirke Corin 01/04/12

## 2012-01-04 ENCOUNTER — Ambulatory Visit (INDEPENDENT_AMBULATORY_CARE_PROVIDER_SITE_OTHER): Payer: 59 | Admitting: Cardiovascular Disease

## 2012-01-04 ENCOUNTER — Encounter: Payer: Self-pay | Admitting: Cardiovascular Disease

## 2012-01-04 VITALS — BP 142/68 | HR 78 | Ht 64.0 in | Wt 145.5 lb

## 2012-01-04 DIAGNOSIS — I10 Essential (primary) hypertension: Secondary | ICD-10-CM

## 2012-01-04 DIAGNOSIS — R9439 Abnormal result of other cardiovascular function study: Secondary | ICD-10-CM

## 2012-01-04 DIAGNOSIS — E785 Hyperlipidemia, unspecified: Secondary | ICD-10-CM

## 2012-01-04 MED ORDER — CARVEDILOL 3.125 MG PO TABS: 3.1250 mg | ORAL_TABLET | Freq: Two times a day (BID) | ORAL | Status: DC

## 2012-01-04 MED ORDER — ASPIRIN 81 MG PO TABS: 81.0000 mg | ORAL_TABLET | Freq: Every day | ORAL | Status: DC

## 2012-01-04 NOTE — Assessment & Plan Note (Signed)
Lab Results  Component Value Date   CHOL 199 12/07/2011   HDL 62.70 12/07/2011   LDLCALC 119* 12/07/2011   LDLDIRECT 127.3 08/24/2011   TRIG 89.0 12/07/2011   CHOLHDL 3 12/07/2011   The patient reports intolerance to statins.

## 2012-01-04 NOTE — Patient Instructions (Addendum)
Start Aspirin 81 mg once daily.  Start Carvedilol 3.125 mg twice daily.  Your physician has requested that you have a cardiac catheterization. Cardiac catheterization is used to diagnose and/or treat various heart conditions. Doctors may recommend this procedure for a number of different reasons. The most common reason is to evaluate chest pain. Chest pain can be a symptom of coronary artery disease (CAD), and cardiac catheterization can show whether plaque is narrowing or blocking your heart's arteries. This procedure is also used to evaluate the valves, as well as measure the blood flow and oxygen levels in different parts of your heart. For further information please visit [email or web address] Please follow instruction sheet, as given.

## 2012-01-04 NOTE — Assessment & Plan Note (Signed)
Her blood pressure is elevated. Carvedilol will be added.

## 2012-01-04 NOTE — Progress Notes (Signed)
  HPI  This is a pleasant 61-year-old African American female who is here today for cardiac evaluation after an abnormal stress test. Her primary care physician is Dr. Bedsole who ordered a treadmill stress test given the patient's prolonged history of diabetes and recent diagnosis of severe carotid stenosis which required right carotid endarterectomy. There was also moderate left carotid stenosis. The patient had a treadmill stress test in our office which was abnormal with 1 mm of ST depression in the inferior and inferolateral leads with frequent PVCs noted. She was overall asymptomatic. Thus, I referred her for a treadmill nuclear stress test given the possibility of false-positive GXT in females. A treadmill stress test showed evidence of severe distal anterior wall ischemia with normal ejection fraction. The patient denies chest pain. She does complain of mild exertional dyspnea and fatigue. She has prolonged history of diabetes. She has known history of hyperlipidemia with intolerance to statins.  Allergies  Allergen Reactions  . Lisinopril-Hydrochlorothiazide Other (See Comments)    angioedema  . Codeine Itching  . Oxycodone   . Tramadol Itching     Current Outpatient Prescriptions on File Prior to Visit  Medication Sig Dispense Refill  . colchicine 0.6 MG tablet Take 0.6 mg by mouth 2 (two) times daily as needed. gout      . colesevelam (WELCHOL) 625 MG tablet Take 3 tablets (1,875 mg total) by mouth 2 (two) times daily with a meal.  180 tablet  11  . diclofenac sodium (VOLTAREN) 1 % GEL Apply 1 application topically daily. Usually on hands      . diphenhydrAMINE (BENADRYL) 25 MG tablet Take 25 mg by mouth every 6 (six) hours as needed. For allergies      . hydrochlorothiazide (HYDRODIURIL) 25 MG tablet Take 1 tablet (25 mg total) by mouth daily.  30 tablet  9  . ibuprofen (ADVIL,MOTRIN) 800 MG tablet TAKE ONE TABLET BY MOUTH EVERY 8 HOURS AS NEEDED FOR PAIN  90 tablet  0  .  metFORMIN (GLUCOPHAGE-XR) 750 MG 24 hr tablet Take 750 mg by mouth daily with breakfast.      . naproxen (NAPROSYN) 500 MG tablet Take 1 tablet (500 mg total) by mouth 2 (two) times daily as needed (pain). Take with food  40 tablet  0  . carvedilol (COREG) 3.125 MG tablet Take 1 tablet (3.125 mg total) by mouth 2 (two) times daily with a meal.  60 tablet  6  . clopidogrel (PLAVIX) 75 MG tablet Take 1 tablet (75 mg total) by mouth daily with breakfast.  30 tablet  6     Past Medical History  Diagnosis Date  . Diabetes mellitus, type 2   . Hypertension   . Hyperlipidemia   . Sleep apnea     hx had tonsilectomy and no longer has   . Arthritis   . Carotid artery narrowing      Past Surgical History  Procedure Date  . Varicose vein surgery 2008    stripping  . Gyn surgery 2004    total hysterectomy for mennorhagia,,salpingoophorectomy  . Epidural block injection 02/2008    Drs. Blackwell, Neudelman  . Tonsillectomy   . Endarterectomy 09/27/2011    Procedure: ENDARTERECTOMY CAROTID;  Surgeon: James D Lawson, MD;  Location: [facility] OR;  Service: Vascular;  Laterality: Right;     Family History  Problem Relation Age of Onset  . Hypertension Sister   . Hypothyroidism Sister   . Cancer Maternal Grandmother       lung     History   Social History  . Marital Status: Divorced    Spouse Name: N/A    Number of Children: 2  . 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 Smoking status: Current Some Day Smoker -- 0.2 packs/day for 25 years    Types: Cigarettes  . Smokeless tobacco: Never Used     Comment: 1 pack every 2-3 days  . Alcohol Use: 1.8 oz/week    3 Shots of liquor per week     Comment: 2 drinks a day  . Drug Use: No  . Sexually Active: Not on file   Other Topics Concern  . Not on file   Social History Narrative   Regular exercise- only at work, walkingDiet- diabetic diet      ROS Constitutional: Negative for fever, chills, diaphoresis, activity change, appetite change and fatigue.  HENT: Negative for hearing loss, nosebleeds, congestion, sore throat, facial swelling, drooling, trouble swallowing, neck pain, voice change, sinus pressure and tinnitus.  Eyes: Negative for photophobia, pain, discharge and visual disturbance.  Respiratory: Negative for apnea, cough, chest tightness  and wheezing.  Cardiovascular: Negative for chest pain, palpitations and leg swelling.  Gastrointestinal: Negative for nausea, vomiting, abdominal pain, diarrhea, constipation, blood in stool and abdominal distention.  Genitourinary: Negative for dysuria, urgency, frequency, hematuria and decreased urine volume.  Musculoskeletal: Negative for myalgias, back pain, joint swelling, arthralgias and gait problem.  Skin: Negative for color change, pallor, rash and wound.  Neurological: Negative for dizziness, tremors, seizures, syncope, speech difficulty, weakness, light-headedness, numbness and headaches.  Psychiatric/Behavioral: Negative for suicidal ideas, hallucinations, behavioral problems and agitation. The patient is not nervous/anxious.     PHYSICAL EXAM   BP 142/68  Pulse 78  Ht 5' 4" (1.626 m)  Wt 145 lb 8 oz (65.998 kg)  BMI 24.97 kg/m2 Constitutional: She is oriented to person, place, and time. She appears well-developed and well-nourished. No distress.  HENT: No nasal discharge.  Head: Normocephalic and atraumatic.  Eyes: Pupils are equal and round. Right eye exhibits no discharge. Left eye exhibits no discharge.  Neck: Normal range of motion. Neck supple. No JVD present. No thyromegaly present. Bilateral carotid bruits. Cardiovascular: Normal rate, regular rhythm, normal heart sounds. Exam reveals no gallop and no friction rub. There is a 2/6 systolic ejection murmur at the aortic area. Pulmonary/Chest: Effort normal and breath sounds normal. No stridor. No respiratory  distress. She has no wheezes. She has no rales. She exhibits no tenderness.  Abdominal: Soft. Bowel sounds are normal. She exhibits no distension. There is no tenderness. There is no rebound and no guarding.  Musculoskeletal: Normal range of motion. She exhibits no edema and no tenderness.  Neurological: She is alert and oriented to person, place, and time. Coordination normal.  Skin: Skin is warm and dry. No rash noted. She is not diaphoretic. No erythema. No pallor.  Psychiatric: She has a normal mood and affect. Her behavior is normal. Judgment and thought content normal.     EKG: Sinus  Rhythm  Low voltage in precordial leads.   -  T-abnormality  -Possible  Anterior ischemia.   ABNORMAL    ASSESSMENT AND PLAN   

## 2012-01-04 NOTE — Assessment & Plan Note (Signed)
The patient had an abnormal treadmill ECG test. Nuclear stress test showed evidence of severe distal anterior wall ischemia with normal ejection fraction. Although the patient reports no chest pain, she has prolonged history of diabetes. She does have exertional dyspnea and fatigue. Due to all of the above, I recommend proceeding with cardiac catheterization and possible coronary intervention. I will plan a right femoral artery access given that the radial artery seems to be very small. I asked her to start aspirin 81 mg once daily. I will also start carvedilol 3.125 mg twice daily. She did have a hypertensive response during the stress test.

## 2012-01-05 LAB — CBC WITH DIFFERENTIAL
Basophils Absolute: 0 10*3/uL (ref 0.0–0.2)
Eosinophils Absolute: 0.1 10*3/uL (ref 0.0–0.4)
Immature Granulocytes: 0 % (ref 0–2)
Lymphocytes Absolute: 2.7 10*3/uL (ref 0.7–3.1)
MCV: 89 fL (ref 79–97)
Monocytes: 8 % (ref 4–12)
Platelets: 225 10*3/uL (ref 155–379)
RDW: 14 % (ref 12.3–15.4)
WBC: 7.1 10*3/uL (ref 3.4–10.8)

## 2012-01-05 LAB — BASIC METABOLIC PANEL
Calcium: 9.5 mg/dL (ref 8.7–10.2)
Chloride: 104 mmol/L (ref 97–108)
GFR calc Af Amer: 79 mL/min/{1.73_m2} (ref >59)
Glucose: 110 mg/dL — ABNORMAL HIGH (ref 65–99)
Potassium: 3.4 mmol/L — ABNORMAL LOW (ref 3.5–5.2)

## 2012-01-05 LAB — PROTIME-INR: Prothrombin Time: 10.3 s (ref 9.1–12.0)

## 2012-01-14 ENCOUNTER — Other Ambulatory Visit: Payer: Self-pay | Admitting: Cardiovascular Disease

## 2012-01-14 DIAGNOSIS — I2510 Atherosclerotic heart disease of native coronary artery without angina pectoris: Secondary | ICD-10-CM

## 2012-01-16 ENCOUNTER — Encounter (HOSPITAL_BASED_OUTPATIENT_CLINIC_OR_DEPARTMENT_OTHER)
Admission: RE | Disposition: A | Discharge status: Home / Self Care | Payer: Self-pay | Source: Ambulatory Visit | Attending: Cardiovascular Disease

## 2012-01-16 ENCOUNTER — Inpatient Hospital Stay (HOSPITAL_BASED_OUTPATIENT_CLINIC_OR_DEPARTMENT_OTHER)
Admission: RE | Admit: 2012-01-16 | Discharge: 2012-01-16 | Disposition: A | Discharge status: Home / Self Care | Payer: 59 | Source: Ambulatory Visit | Attending: Cardiovascular Disease | Admitting: Cardiovascular Disease

## 2012-01-16 ENCOUNTER — Encounter (HOSPITAL_BASED_OUTPATIENT_CLINIC_OR_DEPARTMENT_OTHER): Payer: Self-pay | Admitting: *Deleted

## 2012-01-16 DIAGNOSIS — R9439 Abnormal result of other cardiovascular function study: Secondary | ICD-10-CM | POA: Insufficient documentation

## 2012-01-16 DIAGNOSIS — I2510 Atherosclerotic heart disease of native coronary artery without angina pectoris: Secondary | ICD-10-CM

## 2012-01-16 DIAGNOSIS — R0609 Other forms of dyspnea: Secondary | ICD-10-CM | POA: Insufficient documentation

## 2012-01-16 DIAGNOSIS — E785 Hyperlipidemia, unspecified: Secondary | ICD-10-CM | POA: Insufficient documentation

## 2012-01-16 DIAGNOSIS — Z79899 Other long term (current) drug therapy: Secondary | ICD-10-CM | POA: Insufficient documentation

## 2012-01-16 DIAGNOSIS — E119 Type 2 diabetes mellitus without complications: Secondary | ICD-10-CM | POA: Insufficient documentation

## 2012-01-16 DIAGNOSIS — I10 Essential (primary) hypertension: Secondary | ICD-10-CM | POA: Insufficient documentation

## 2012-01-16 DIAGNOSIS — R0989 Other specified symptoms and signs involving the circulatory and respiratory systems: Secondary | ICD-10-CM | POA: Insufficient documentation

## 2012-01-16 DIAGNOSIS — I6529 Occlusion and stenosis of unspecified carotid artery: Secondary | ICD-10-CM | POA: Insufficient documentation

## 2012-01-16 SURGERY — JV LEFT HEART CATHETERIZATION WITH CORONARY ANGIOGRAM
Anesthesia: Moderate Sedation

## 2012-01-16 MED ORDER — SODIUM CHLORIDE 0.9 % IV SOLN
250.0000 mL | INTRAVENOUS | Status: DC | PRN
Start: 2012-01-16 — End: 2012-01-16

## 2012-01-16 MED ORDER — SODIUM CHLORIDE 0.9 % IJ SOLN: 3.0000 mL | INTRAMUSCULAR | Status: DC | PRN

## 2012-01-16 MED ORDER — SODIUM CHLORIDE 0.9 % IJ SOLN: 3.0000 mL | Freq: Two times a day (BID) | INTRAMUSCULAR | Status: DC

## 2012-01-16 MED ORDER — SODIUM CHLORIDE 0.9 % IV SOLN
INTRAVENOUS | Status: DC
  Administered 2012-01-16: 08:00:00 via INTRAVENOUS

## 2012-01-16 MED ORDER — ASPIRIN 81 MG PO CHEW
324.0000 mg | CHEWABLE_TABLET | ORAL | Status: AC
  Administered 2012-01-16: 324 mg via ORAL

## 2012-01-16 MED ORDER — SODIUM CHLORIDE 0.9 % IV SOLN: INTRAVENOUS | Status: DC

## 2012-01-16 MED ORDER — ACETAMINOPHEN 325 MG PO TABS: 650.0000 mg | ORAL_TABLET | ORAL | Status: DC | PRN

## 2012-01-16 NOTE — OR Nursing (Signed)
Dr Arida at bedside to discuss results and treatment plan with pt and family 

## 2012-01-16 NOTE — OR Nursing (Signed)
Tegaderm dressing applied, site level 0, bedrest begins at 1000 

## 2012-01-16 NOTE — CV Procedure (Signed)
    Cardiac Catheterization Procedure Note  Name: April Shaffer MRN: [id number] DOB: 09/17/1950  Procedure: Left Heart Cath, Selective Coronary Angiography, LV angiography  Indication: Dyspnea without abnormal stress test.   Medications:  Sedation:  2 mg IV Versed, 50 mcg IV Fentanyl  Contrast:  70 mouth Omnipaque  Procedural details: The right groin was prepped, draped, and anesthetized with 1% lidocaine. Using modified Seldinger technique, a 4 French sheath was introduced into the right femoral artery. Standard Judkins catheters were used for coronary angiography and left ventriculography. Catheter exchanges were performed over a guidewire. There were no immediate procedural complications. The patient was transferred to the post catheterization recovery area for further monitoring.   Procedural Findings:  Hemodynamics: AO:  140/66   mmHg LV:  150/18    mmHg LVEDP: 20  mmHg  Coronary angiography: Coronary dominance: Left   Left Main:  Normal in size and mildly calcified. There is mild 10% distal stenosis.  Left Anterior Descending (LAD):  Normal in size and moderately calcified throughout its course. There is a 50% discrete ostial stenosis. In the proximal extending into the midsegment, there is a 50% tubular stenosis followed by a short aneurysmal segment. The rest of the vessel has minor irregularities.  1st diagonal (D1):  Normal in size with 30% proximal disease.  2nd diagonal (D2):  Normal in size with 30% proximal disease.  3rd diagonal (D3):  Small in size with minor irregularities.  Circumflex (LCx):  Large in size and dominant. The vessel is mildly calcified with minor irregularities noted. There is a 30% proximal disease.  1st obtuse marginal:  Small in size with no significant disease.  2nd obtuse marginal:  Small in size with no significant disease.  3rd obtuse marginal:  Normal in size with diffuse 30% proximal disease.   The posterior AV groove artery has  minor irregularities and gives the PDA and 2 posterolateral branches.  Right Coronary Artery: Small in size and nondominant. The vessel has minor irregularities.  Left ventriculography: Left ventricular systolic function is normal  , LVEF is estimated at 55 %, there is no significant mitral regurgitation   Final Conclusions:   1. No evidence of obstructive coronary artery disease. There is moderate LAD disease which is diffuse as well as mild disease in the left circumflex. 2. Normal LV systolic function. 3. Moderate systemic hypertension with mildly elevated LVEDP and mild gradient across the aortic valve.  Recommendations:  I recommend aggressive medical therapy. We will have to see if she could tolerate a statin which should have a great benefit in her situation.  Lorine Bears MD, [facility] [place] 01/16/2012, 9:47 AM

## 2012-01-16 NOTE — OR Nursing (Signed)
Meal served 

## 2012-01-16 NOTE — OR Nursing (Signed)
Discharge instructions reviewed and signed, pt stated understanding, ambulated in hall without difficulty, site level 0, transported to daughter's car via wheelchair 

## 2012-01-16 NOTE — H&P (View-Only) (Signed)
HPI  This is a pleasant 61 year old African American female who is here today for cardiac evaluation after an abnormal stress test. Her primary care physician is Dr. Ermalene Searing who ordered a treadmill stress test given the patient's prolonged history of diabetes and recent diagnosis of severe carotid stenosis which required right carotid endarterectomy. There was also moderate left carotid stenosis. The patient had a treadmill stress test in our office which was abnormal with 1 mm of ST depression in the inferior and inferolateral leads with frequent PVCs noted. She was overall asymptomatic. Thus, I referred her for a treadmill nuclear stress test given the possibility of false-positive GXT in females. A treadmill stress test showed evidence of severe distal anterior wall ischemia with normal ejection fraction. The patient denies chest pain. She does complain of mild exertional dyspnea and fatigue. She has prolonged history of diabetes. She has known history of hyperlipidemia with intolerance to statins.  Allergies  Allergen Reactions  . Lisinopril-Hydrochlorothiazide Other (See Comments)    angioedema  . Codeine Itching  . Oxycodone   . Tramadol Itching     Current Outpatient Prescriptions on File Prior to Visit  Medication Sig Dispense Refill  . colchicine 0.6 MG tablet Take 0.6 mg by mouth 2 (two) times daily as needed. gout      . colesevelam (WELCHOL) 625 MG tablet Take 3 tablets (1,875 mg total) by mouth 2 (two) times daily with a meal.  180 tablet  11  . diclofenac sodium (VOLTAREN) 1 % GEL Apply 1 application topically daily. Usually on hands      . diphenhydrAMINE (BENADRYL) 25 MG tablet Take 25 mg by mouth every 6 (six) hours as needed. For allergies      . hydrochlorothiazide (HYDRODIURIL) 25 MG tablet Take 1 tablet (25 mg total) by mouth daily.  30 tablet  9  . ibuprofen (ADVIL,MOTRIN) 800 MG tablet TAKE ONE TABLET BY MOUTH EVERY 8 HOURS AS NEEDED FOR PAIN  90 tablet  0  .  metFORMIN (GLUCOPHAGE-XR) 750 MG 24 hr tablet Take 750 mg by mouth daily with breakfast.      . naproxen (NAPROSYN) 500 MG tablet Take 1 tablet (500 mg total) by mouth 2 (two) times daily as needed (pain). Take with food  40 tablet  0  . carvedilol (COREG) 3.125 MG tablet Take 1 tablet (3.125 mg total) by mouth 2 (two) times daily with a meal.  60 tablet  6  . clopidogrel (PLAVIX) 75 MG tablet Take 1 tablet (75 mg total) by mouth daily with breakfast.  30 tablet  6     Past Medical History  Diagnosis Date  . Diabetes mellitus, type 2   . Hypertension   . Hyperlipidemia   . Sleep apnea     hx had tonsilectomy and no longer has   . Arthritis   . Carotid artery narrowing      Past Surgical History  Procedure Date  . Varicose vein surgery 2008    stripping  . Gyn surgery 2004    total hysterectomy for mennorhagia,,salpingoophorectomy  . Epidural block injection 02/2008    Drs. Dorthula Nettles  . Tonsillectomy   . Endarterectomy 09/27/2011    Procedure: ENDARTERECTOMY CAROTID;  Surgeon: Pryor Ochoa, MD;  Location: [facility] OR;  Service: Vascular;  Laterality: Right;     Family History  Problem Relation Age of Onset  . Hypertension Sister   . Hypothyroidism Sister   . Cancer Maternal Grandmother  lung     History   Social History  . Marital Status: Divorced    Spouse Name: N/A    Number of Children: 2  . 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 Smoking status: Current Some Day Smoker -- 0.2 packs/day for 25 years    Types: Cigarettes  . Smokeless tobacco: Never Used     Comment: 1 pack every 2-3 days  . Alcohol Use: 1.8 oz/week    3 Shots of liquor per week     Comment: 2 drinks a day  . Drug Use: No  . Sexually Active: Not on file   Other Topics Concern  . Not on file   Social History Narrative   Regular exercise- only at work, walkingDiet- diabetic diet      ROS Constitutional: Negative for fever, chills, diaphoresis, activity change, appetite change and fatigue.  HENT: Negative for hearing loss, nosebleeds, congestion, sore throat, facial swelling, drooling, trouble swallowing, neck pain, voice change, sinus pressure and tinnitus.  Eyes: Negative for photophobia, pain, discharge and visual disturbance.  Respiratory: Negative for apnea, cough, chest tightness  and wheezing.  Cardiovascular: Negative for chest pain, palpitations and leg swelling.  Gastrointestinal: Negative for nausea, vomiting, abdominal pain, diarrhea, constipation, blood in stool and abdominal distention.  Genitourinary: Negative for dysuria, urgency, frequency, hematuria and decreased urine volume.  Musculoskeletal: Negative for myalgias, back pain, joint swelling, arthralgias and gait problem.  Skin: Negative for color change, pallor, rash and wound.  Neurological: Negative for dizziness, tremors, seizures, syncope, speech difficulty, weakness, light-headedness, numbness and headaches.  Psychiatric/Behavioral: Negative for suicidal ideas, hallucinations, behavioral problems and agitation. The patient is not nervous/anxious.     PHYSICAL EXAM   BP 142/68  Pulse 78  Ht 5\' 4"  (1.626 m)  Wt 145 lb 8 oz (65.998 kg)  BMI 24.97 kg/m2 Constitutional: She is oriented to person, place, and time. She appears well-developed and well-nourished. No distress.  HENT: No nasal discharge.  Head: Normocephalic and atraumatic.  Eyes: Pupils are equal and round. Right eye exhibits no discharge. Left eye exhibits no discharge.  Neck: Normal range of motion. Neck supple. No JVD present. No thyromegaly present. Bilateral carotid bruits. Cardiovascular: Normal rate, regular rhythm, normal heart sounds. Exam reveals no gallop and no friction rub. There is a 2/6 systolic ejection murmur at the aortic area. Pulmonary/Chest: Effort normal and breath sounds normal. No stridor. No respiratory  distress. She has no wheezes. She has no rales. She exhibits no tenderness.  Abdominal: Soft. Bowel sounds are normal. She exhibits no distension. There is no tenderness. There is no rebound and no guarding.  Musculoskeletal: Normal range of motion. She exhibits no edema and no tenderness.  Neurological: She is alert and oriented to person, place, and time. Coordination normal.  Skin: Skin is warm and dry. No rash noted. She is not diaphoretic. No erythema. No pallor.  Psychiatric: She has a normal mood and affect. Her behavior is normal. Judgment and thought content normal.     EKG: Sinus  Rhythm  Low voltage in precordial leads.   -  T-abnormality  -Possible  Anterior ischemia.   ABNORMAL    ASSESSMENT AND PLAN

## 2012-01-16 NOTE — Interval H&P Note (Signed)
History and Physical Interval Note:  01/16/2012 9:15 AM  April Shaffer  has presented today for surgery, with the diagnosis of Positive stress test  The various methods of treatment have been discussed with the patient and family. After consideration of risks, benefits and other options for treatment, the patient has consented to  Procedure(s) (LRB) with comments: JV LEFT HEART CATHETERIZATION WITH CORONARY ANGIOGRAM (N/A) as a surgical intervention .  The patient's history has been reviewed, patient examined, no change in status, stable for surgery.  I have reviewed the patient's chart and labs.  Questions were answered to the patient's satisfaction.     Lorine Bears

## 2012-01-30 ENCOUNTER — Encounter: Payer: 59 | Admitting: Cardiovascular Disease

## 2012-02-13 ENCOUNTER — Ambulatory Visit (INDEPENDENT_AMBULATORY_CARE_PROVIDER_SITE_OTHER): Payer: 59 | Admitting: Cardiovascular Disease

## 2012-02-13 ENCOUNTER — Encounter: Payer: Self-pay | Admitting: Cardiovascular Disease

## 2012-02-13 VITALS — BP 164/90 | HR 84 | Ht 64.0 in | Wt 143.1 lb

## 2012-02-13 DIAGNOSIS — E785 Hyperlipidemia, unspecified: Secondary | ICD-10-CM

## 2012-02-13 DIAGNOSIS — I2510 Atherosclerotic heart disease of native coronary artery without angina pectoris: Secondary | ICD-10-CM | POA: Insufficient documentation

## 2012-02-13 DIAGNOSIS — I10 Essential (primary) hypertension: Secondary | ICD-10-CM

## 2012-02-13 MED ORDER — CARVEDILOL 6.25 MG PO TABS: 6.2500 mg | ORAL_TABLET | Freq: Two times a day (BID) | ORAL | Status: DC

## 2012-02-13 NOTE — Assessment & Plan Note (Signed)
The patient did have a hypertensive response during the stress test which possibly was responsible for false positive stress test. Her blood pressure is still elevated and thus I will increase carvedilol to 6.25 mg twice daily.

## 2012-02-13 NOTE — Assessment & Plan Note (Addendum)
The patient is intolerant to statins. I am not exactly sure which medications were tried. Consider adding pravastatin if this has not been tried. Due to her carotid disease and moderate coronary artery disease, there is a strong indication for statin if tolerated.

## 2012-02-13 NOTE — Patient Instructions (Addendum)
Your physician recommends that you schedule a follow-up appointment in: only if needed  Your physician has recommended you make the following change in your medication: INCREASE your Coreg (Carvedilol) to 6.25mg  twice daily

## 2012-02-13 NOTE — Progress Notes (Signed)
HPI  This is a pleasant 61 year old [place] American female who is here today for a followup visit. She has known history of diabetes and carotid disease status post endarterectomy. She was seen recently after an abnormal stress test. She underwent cardiac catheterization which showed moderate disease in the left anterior descending artery and mild disease in the left circumflex. Overall there was no evidence of obstructive CAD. No revascularization was required. She has known history of hyperlipidemia with intolerance to statins. Overall she reports no chest pain or dyspnea. She is doing well.  Allergies  Allergen Reactions  . Lisinopril-Hydrochlorothiazide Other (See Comments)    angioedema  . Codeine Itching  . Oxycodone   . Tramadol Itching     Current Outpatient Prescriptions on File Prior to Visit  Medication Sig Dispense Refill  . aspirin 81 MG tablet Take 1 tablet (81 mg total) by mouth daily.  30 tablet    . carvedilol (COREG) 6.25 MG tablet Take 1 tablet (6.25 mg total) by mouth 2 (two) times daily with a meal.  60 tablet  6  . clopidogrel (PLAVIX) 75 MG tablet Take 1 tablet (75 mg total) by mouth daily with breakfast.  30 tablet  6  . colchicine 0.6 MG tablet Take 0.6 mg by mouth 2 (two) times daily as needed. gout      . colesevelam (WELCHOL) 625 MG tablet Take 3 tablets (1,875 mg total) by mouth 2 (two) times daily with a meal.  180 tablet  11  . diclofenac sodium (VOLTAREN) 1 % GEL Apply 1 application topically daily. Usually on hands      . diphenhydrAMINE (BENADRYL) 25 MG tablet Take 25 mg by mouth every 6 (six) hours as needed. For allergies      . hydrochlorothiazide (HYDRODIURIL) 25 MG tablet Take 1 tablet (25 mg total) by mouth daily.  30 tablet  9  . naproxen (NAPROSYN) 500 MG tablet Take 1 tablet (500 mg total) by mouth 2 (two) times daily as needed (pain). Take with food  40 tablet  0     Past Medical History  Diagnosis Date  . Diabetes mellitus, type 2   .  Hypertension   . Hyperlipidemia   . Sleep apnea     hx had tonsilectomy and no longer has   . Arthritis   . Carotid artery narrowing   . Coronary artery disease     Cardiac catheterization November 2013: 50% ostial LAD stenosis 50% mid stenosis. 30% disease in the left circumflex.     Past Surgical History  Procedure Date  . Varicose vein surgery 2008    stripping  . Gyn surgery 2004    total hysterectomy for mennorhagia,,salpingoophorectomy  . Epidural block injection 02/2008    Drs. Dorthula Nettles  . Tonsillectomy   . Endarterectomy 09/27/2011    Procedure: ENDARTERECTOMY CAROTID;  Surgeon: Pryor Ochoa, MD;  Location: [facility] OR;  Service: Vascular;  Laterality: Right;  . Cardiac catheterization      Family History  Problem Relation Age of Onset  . Hypertension Sister   . Hypothyroidism Sister   . Cancer Maternal Grandmother     lung     History   Social History  . Marital Status: Divorced    Spouse Name: N/A    Number of Children: 2  . 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 Smoking status: Current  Some Day Smoker -- 0.2 packs/day for 25 years    Types: Cigarettes  . Smokeless tobacco: Never Used     Comment: 1 pack every 2-3 days  . Alcohol Use: 1.8 oz/week    3 Shots of liquor per week     Comment: 2 drinks a day  . Drug Use: No  . Sexually Active: Not on file   Other Topics Concern  . Not on file   Social History Narrative   Regular exercise- only at work, walkingDiet- diabetic diet      PHYSICAL EXAM   BP 164/90  Pulse 84  Ht 5\' 4"  (1.626 m)  Wt 143 lb 1.9 oz (64.919 kg)  BMI 24.57 kg/m2  SpO2 98% Constitutional: She is oriented to person, place, and time. She appears well-developed and well-nourished. No distress.  HENT: No nasal discharge.  Head: Normocephalic and atraumatic.  Eyes: Pupils are equal and round. Right eye exhibits no discharge. Left  eye exhibits no discharge.  Neck: Normal range of motion. Neck supple. No JVD present. No thyromegaly present. Bilateral carotid bruits. Cardiovascular: Normal rate, regular rhythm, normal heart sounds. Exam reveals no gallop and no friction rub. There is a 2/6 systolic ejection murmur at the aortic area. Pulmonary/Chest: Effort normal and breath sounds normal. No stridor. No respiratory distress. She has no wheezes. She has no rales. She exhibits no tenderness.  Abdominal: Soft. Bowel sounds are normal. She exhibits no distension. There is no tenderness. There is no rebound and no guarding.  Musculoskeletal: Normal range of motion. She exhibits no edema and no tenderness.  Neurological: She is alert and oriented to person, place, and time. Coordination normal.  Skin: Skin is warm and dry. No rash noted. She is not diaphoretic. No erythema. No pallor.  Psychiatric: She has a normal mood and affect. Her behavior is normal. Judgment and thought content normal.  Right groin: No hematoma.      ASSESSMENT AND PLAN

## 2012-02-13 NOTE — Assessment & Plan Note (Signed)
The patient was noted to have moderate nonobstructive coronary artery disease in the left anterior descending artery. No revascularization was needed. I recommend aggressive medical therapy of her risk factors. She currently has no symptoms suggestive of angina or heart failure. She can followup with [place] as needed.

## 2012-03-24 ENCOUNTER — Ambulatory Visit: Payer: 59 | Admitting: Family Medicine

## 2012-03-28 ENCOUNTER — Encounter: Payer: Self-pay | Admitting: Family Medicine

## 2012-03-28 ENCOUNTER — Ambulatory Visit (INDEPENDENT_AMBULATORY_CARE_PROVIDER_SITE_OTHER): Payer: 59 | Admitting: Family Medicine

## 2012-03-28 VITALS — BP 120/72 | HR 59 | Temp 98.1°F | Ht 64.0 in | Wt 143.5 lb

## 2012-03-28 DIAGNOSIS — I10 Essential (primary) hypertension: Secondary | ICD-10-CM

## 2012-03-28 DIAGNOSIS — M109 Gout, unspecified: Secondary | ICD-10-CM

## 2012-03-28 DIAGNOSIS — E785 Hyperlipidemia, unspecified: Secondary | ICD-10-CM

## 2012-03-28 DIAGNOSIS — E119 Type 2 diabetes mellitus without complications: Secondary | ICD-10-CM

## 2012-03-28 LAB — LIPID PANEL
Cholesterol: 221 mg/dL — ABNORMAL HIGH (ref 0–200)
Triglycerides: 65 mg/dL (ref 0.0–149.0)

## 2012-03-28 LAB — COMPREHENSIVE METABOLIC PANEL
CO2: 30 mEq/L (ref 19–32)
Calcium: 9.1 mg/dL (ref 8.4–10.5)
Chloride: 102 mEq/L (ref 96–112)
Creatinine, Ser: 0.9 mg/dL (ref 0.4–1.2)
GFR: 80.13 mL/min (ref >60.00)
Glucose, Bld: 96 mg/dL (ref 70–99)
Sodium: 137 mEq/L (ref 135–145)
Total Bilirubin: 0.9 mg/dL (ref 0.3–1.2)
Total Protein: 7.7 g/dL (ref 6.0–8.3)

## 2012-03-28 LAB — LDL CHOLESTEROL, DIRECT: Direct LDL: 139.7 mg/dL

## 2012-03-28 MED ORDER — NAPROXEN 500 MG PO TABS: 500.0000 mg | ORAL_TABLET | Freq: Two times a day (BID) | ORAL | Status: DC | PRN

## 2012-03-28 MED ORDER — VARENICLINE TARTRATE 0.5 MG X 11 & 1 MG X 42 PO MISC: ORAL | Status: DC

## 2012-03-28 MED ORDER — VARENICLINE TARTRATE 1 MG PO TABS: 1.0000 mg | ORAL_TABLET | Freq: Two times a day (BID) | ORAL | Status: DC

## 2012-03-28 MED ORDER — INDOMETHACIN 50 MG PO CAPS: 50.0000 mg | ORAL_CAPSULE | Freq: Two times a day (BID) | ORAL | Status: DC

## 2012-03-28 NOTE — Assessment & Plan Note (Signed)
Eval uric acid. Refilled treatment meds.

## 2012-03-28 NOTE — Assessment & Plan Note (Signed)
Well-controlled on current regimen. ?

## 2012-03-28 NOTE — Patient Instructions (Addendum)
Schedule physical in 3 months with fasting labs prior. Continue working on smoking cessation. Start chantix for smoking cessation.

## 2012-03-28 NOTE — Assessment & Plan Note (Signed)
Well controlled. Continue current medication.  

## 2012-03-28 NOTE — Progress Notes (Signed)
Subjective:    Patient ID: April Shaffer, female    DOB: 12-Feb-1951, 62 y.o.   MRN: [id number]  HPI  Had CEA in 08/2011... Had complication with swallowing after Surgery. improving per barium swallow and pt report.   Cardiology: Coronary artery disease - Lorine Bears, MD 02/13/2012 6:11 PM Signed  The patient was noted to have moderate nonobstructive coronary artery disease in the left anterior descending artery. No revascularization was needed. I recommend aggressive medical therapy of her risk factors.  She currently has no symptoms suggestive of angina or heart failure.  She can followup with [place] as needed.  Diabetes: Previously great control on glucophage alone.  Due for re-eval  6 months from previous. Lab Results  Component Value Date   HGBA1C 5.7 12/07/2011   Using medications without difficulties:  Hypoglycemic episodes:none  Hyperglycemic episodes:none  Feet problems:none  Blood Sugars averaging: 90s  Wt Readings from Last 3 Encounters:   12/11/11  140 lb 4 oz (63.617 kg)   10/16/11  134 lb (60.782 kg)   10/02/11  136 lb 6.4 oz (61.871 kg)    Eye exam: due.  Hypertension: well controlled on HCTZ , had angioedema with lisinopril. Now on coreg Using medication without problems or lightheadedness:  Chest pain with exertion:None  Edema:None  Short of breath:None  Average home BPs: not checking lately.   Elevated Cholesterol: Due for re-eval today on welchol LDL not at goal<70 (new goal with carotid stenosis) on red yeast rice, but improved from last check. Had SE to crestor and lipitor.   No SE to welchol. Diet: Moderate  Exercise: walking a lot.  Other complaints:  She is working on quitting smoking. 5 cigs a day.   Gout, only one flare in last year. Using colchicone and indocin prn rarely. .    Review of Systems  Constitutional: Negative for fever and fatigue.  HENT: Negative for ear pain.   Eyes: Negative for pain.  Respiratory: Negative for chest  tightness and shortness of breath.   Cardiovascular: Negative for chest pain, palpitations and leg swelling.  Gastrointestinal: Negative for abdominal pain.  Genitourinary: Negative for dysuria.       Objective:   Physical Exam  Constitutional: Vital signs are normal. She appears well-developed and well-nourished. She is cooperative.  Non-toxic appearance. She does not appear ill. No distress.  HENT:  Head: Normocephalic.  Right Ear: Hearing, tympanic membrane, external ear and ear canal normal. Tympanic membrane is not erythematous, not retracted and not bulging.  Left Ear: Hearing, tympanic membrane, external ear and ear canal normal. Tympanic membrane is not erythematous, not retracted and not bulging.  Nose: No mucosal edema or rhinorrhea. Right sinus exhibits no maxillary sinus tenderness and no frontal sinus tenderness. Left sinus exhibits no maxillary sinus tenderness and no frontal sinus tenderness.  Mouth/Throat: Uvula is midline, oropharynx is clear and moist and mucous membranes are normal.  Eyes: Conjunctivae normal, EOM and lids are normal. Pupils are equal, round, and reactive to light. No foreign bodies found.  Neck: Trachea normal and normal range of motion. Neck supple. Carotid bruit is not present. No mass and no thyromegaly present.  Cardiovascular: Normal rate, regular rhythm, S1 normal, S2 normal, normal heart sounds, intact distal pulses and normal pulses.  Exam reveals no gallop and no friction rub.   No murmur heard.      No bruit,CEA scarring on neck  Pulmonary/Chest: Effort normal and breath sounds normal. Not tachypneic. No respiratory distress. She has no  decreased breath sounds. She has no wheezes. She has no rhonchi. She has no rales.  Abdominal: Soft. Normal appearance and bowel sounds are normal. There is no tenderness.  Neurological: She is alert.  Skin: Skin is warm, dry and intact. No rash noted.  Psychiatric: Her speech is normal and behavior is normal.  Judgment and thought content normal. Her mood appears not anxious. Cognition and memory are normal. She does not exhibit a depressed mood.          Assessment & Plan:

## 2012-03-28 NOTE — Assessment & Plan Note (Signed)
Due for re-eval on welchol. New goal <70.

## 2012-04-15 ENCOUNTER — Other Ambulatory Visit: Payer: 59

## 2012-04-15 ENCOUNTER — Ambulatory Visit: Payer: 59 | Admitting: Vascular Surgery

## 2012-05-13 ENCOUNTER — Other Ambulatory Visit: Payer: 59

## 2012-05-13 ENCOUNTER — Ambulatory Visit: Payer: 59 | Admitting: Vascular Surgery

## 2012-06-12 ENCOUNTER — Other Ambulatory Visit (INDEPENDENT_AMBULATORY_CARE_PROVIDER_SITE_OTHER): Payer: 59

## 2012-06-12 DIAGNOSIS — E119 Type 2 diabetes mellitus without complications: Secondary | ICD-10-CM

## 2012-06-12 LAB — COMPREHENSIVE METABOLIC PANEL
ALT: 12 U/L (ref 0–35)
AST: 21 U/L (ref 0–37)
Alkaline Phosphatase: 61 U/L (ref 39–117)
Calcium: 8.9 mg/dL (ref 8.4–10.5)
Chloride: 106 mEq/L (ref 96–112)
Creatinine, Ser: 1 mg/dL (ref 0.4–1.2)

## 2012-06-12 LAB — LIPID PANEL
HDL: 57.7 mg/dL (ref >39.00)
LDL Cholesterol: 123 mg/dL — ABNORMAL HIGH (ref 0–99)
Total CHOL/HDL Ratio: 3
Triglycerides: 89 mg/dL (ref 0.0–149.0)
VLDL: 17.8 mg/dL (ref 0.0–40.0)

## 2012-06-12 LAB — HEMOGLOBIN A1C: Hgb A1c MFr Bld: 5.8 % (ref 4.6–6.5)

## 2012-06-17 ENCOUNTER — Encounter: Payer: Self-pay | Admitting: Family Medicine

## 2012-06-17 ENCOUNTER — Ambulatory Visit (INDEPENDENT_AMBULATORY_CARE_PROVIDER_SITE_OTHER): Payer: 59 | Admitting: Family Medicine

## 2012-06-17 VITALS — BP 130/72 | HR 87 | Temp 98.1°F | Ht 64.0 in | Wt 148.5 lb

## 2012-06-17 DIAGNOSIS — E785 Hyperlipidemia, unspecified: Secondary | ICD-10-CM

## 2012-06-17 DIAGNOSIS — E119 Type 2 diabetes mellitus without complications: Secondary | ICD-10-CM

## 2012-06-17 DIAGNOSIS — I10 Essential (primary) hypertension: Secondary | ICD-10-CM

## 2012-06-17 MED ORDER — ROSUVASTATIN CALCIUM 20 MG PO TABS: 20.0000 mg | ORAL_TABLET | Freq: Every day | ORAL | Status: DC

## 2012-06-17 NOTE — Assessment & Plan Note (Signed)
Change back to crestor... Per pt she is willing to tolerate SE she had in past... Swelling under eyes.

## 2012-06-17 NOTE — Assessment & Plan Note (Signed)
Well controlled. Continue current medication.  

## 2012-06-17 NOTE — Progress Notes (Signed)
62 year old femlae presents for 3 month follow up.  Had CEA in 08/2011... Had complication with swallowing after Surgery. improving per barium swallow and pt report.  Cardiology: Coronary artery disease - Lorine Bears, MD  The patient was noted to have moderate nonobstructive coronary artery disease in the left anterior descending artery. No revascularization was needed. I recommend aggressive medical therapy of her risk factors.  She currently has no symptoms suggestive of angina or heart failure.  She can followup with [place] as needed.  Diabetes: Great control on glucophage alone.  Lab Results  Component Value Date   HGBA1C 5.8 06/12/2012  Using medications without difficulties: None Hypoglycemic episodes:none  Hyperglycemic episodes:none  Feet problems:none  Blood Sugars averaging: checking once a day...102 Wt Readings from Last 3 Encounters:  06/17/12 148 lb 8 oz (67.359 kg)  03/28/12 143 lb 8 oz (65.091 kg)  02/13/12 143 lb 1.9 oz (64.919 kg)  Eye exam: due.  Hypertension: well controlled on HCTZ , had angioedema with lisinopril. Now on coreg.  Using medication without problems or lightheadedness: None Chest pain with exertion:None  Edema:None  Short of breath:None  Average home BPs: not checking lately.   Elevated Cholesterol:  Improved but not at goal on welchol. Lab Results  Component Value Date   CHOL 198 06/12/2012   HDL 57.70 06/12/2012   LDLCALC 123* 06/12/2012   LDLDIRECT 139.7 03/28/2012   TRIG 89.0 06/12/2012   CHOLHDL 3 06/12/2012  Not at goal<70 (new goal with carotid stenosis) on red yeast rice, but improved from last check. Had SE to crestor and lipitor but is willing to retry crestor. No SE to welchol.... She does not like taking all those pills. Diet: Moderate  Exercise: None Other complaints:  She is working on quitting smoking. 2-3 cigs a day now.   Gout, only one flare in last year. Using colchicone and indocin prn rarely. .  Review of Systems   Constitutional: Negative for fever and fatigue.  HENT: Negative for ear pain.  Eyes: Negative for pain.  Respiratory: Negative for chest tightness and shortness of breath.  Cardiovascular: Negative for chest pain, palpitations and leg swelling.  Gastrointestinal: Negative for abdominal pain.  Genitourinary: Negative for dysuria.  Objective:   Physical Exam  Constitutional: Vital signs are normal. She appears well-developed and well-nourished. She is cooperative. Non-toxic appearance. She does not appear ill. No distress.  HENT:  Head: Normocephalic.  Right Ear: Hearing, tympanic membrane, external ear and ear canal normal. Tympanic membrane is not erythematous, not retracted and not bulging.  Left Ear: Hearing, tympanic membrane, external ear and ear canal normal. Tympanic membrane is not erythematous, not retracted and not bulging.  Nose: No mucosal edema or rhinorrhea. Right sinus exhibits no maxillary sinus tenderness and no frontal sinus tenderness. Left sinus exhibits no maxillary sinus tenderness and no frontal sinus tenderness.  Mouth/Throat: Uvula is midline, oropharynx is clear and moist and mucous membranes are normal.  Eyes: Conjunctivae normal, EOM and lids are normal. Pupils are equal, round, and reactive to light. No foreign bodies found.  Neck: Trachea normal and normal range of motion. Neck supple. Carotid bruit is not present. No mass and no thyromegaly present.  Cardiovascular: Normal rate, regular rhythm, S1 normal, S2 normal, normal heart sounds, intact distal pulses and normal pulses. Exam reveals no gallop and no friction rub.  No murmur heard. No bruit,CEA scarring on neck  Pulmonary/Chest: Effort normal and breath sounds normal. Not tachypneic. No respiratory distress. She has no  decreased breath sounds. She has no wheezes. She has no rhonchi. She has no rales.  Abdominal: Soft. Normal appearance and bowel sounds are normal. There is no tenderness.  Neurological: She  is alert.  Skin: Skin is warm, dry and intact. No rash noted.  Psychiatric: Her speech is normal and behavior is normal. Judgment and thought content normal. Her mood appears not anxious. Cognition and memory are normal. She does not exhibit a depressed mood.

## 2012-06-17 NOTE — Patient Instructions (Addendum)
Stop welchol. Restart crestor at 20 mg daily. We will recheck labs in 3 months . Appt for follow up in 3 months with fasting labs prior. Get back on track with exercise and weight loss. Make sure to set up eye appt. Keep on working on quitting smoking.

## 2012-06-17 NOTE — Assessment & Plan Note (Signed)
Well controlled on glucophage.

## 2012-07-07 ENCOUNTER — Encounter: Payer: Self-pay | Admitting: Vascular Surgery

## 2012-07-08 ENCOUNTER — Other Ambulatory Visit (INDEPENDENT_AMBULATORY_CARE_PROVIDER_SITE_OTHER): Payer: 59 | Admitting: *Deleted

## 2012-07-08 ENCOUNTER — Encounter: Payer: Self-pay | Admitting: Vascular Surgery

## 2012-07-08 ENCOUNTER — Ambulatory Visit (INDEPENDENT_AMBULATORY_CARE_PROVIDER_SITE_OTHER): Payer: 59 | Admitting: Vascular Surgery

## 2012-07-08 DIAGNOSIS — I6529 Occlusion and stenosis of unspecified carotid artery: Secondary | ICD-10-CM

## 2012-07-08 DIAGNOSIS — Z48812 Encounter for surgical aftercare following surgery on the circulatory system: Secondary | ICD-10-CM

## 2012-07-08 NOTE — Progress Notes (Signed)
Subjective:     Patient ID: April Shaffer, female   DOB: Jun 24, 1950, 62 y.o.   MRN: [id number]  HPI this 62 year old female returns for continued followup regarding her cerebrovascular disease. She had a right carotid endarterectomy performed in August of 2013 and was found to have a severely and diffusely diseased distal right internal carotid artery at the time of surgery. The artery had minimal flow and subsequently occluded with no neurologic complications. She is being followed for moderate left ICA stenosis. She takes 1 aspirin per day. She denies any akinesia, lateralizing weakness, amaurosis fugax, diplopia, blurred vision, or syncope. She recently had a cardiac catheterization with no significant blockages found.  Past Medical History  Diagnosis Date  . Diabetes mellitus, type 2   . Hypertension   . Hyperlipidemia   . Sleep apnea     hx had tonsilectomy and no longer has   . Arthritis   . Carotid artery narrowing   . Coronary artery disease     Cardiac catheterization November 2013: 50% ostial LAD stenosis 50% mid stenosis. 30% disease in the left circumflex.    History  Substance Use Topics  . Smoking status: Current Some Day Smoker -- 0.30 packs/day for 25 years    Types: Cigarettes  . Smokeless tobacco: Never Used     Comment: pt states she smokes about 3-4 cigs per day  . Alcohol Use: 1.8 oz/week    3 Shots of liquor per week     Comment: 2 drinks a day    Family History  Problem Relation Age of Onset  . Hypertension Sister   . Hypothyroidism Sister   . Cancer Maternal Grandmother     lung    Allergies  Allergen Reactions  . Lisinopril-Hydrochlorothiazide Other (See Comments)    angioedema  . Codeine Itching  . Oxycodone   . Tramadol Itching    Current outpatient prescriptions:aspirin 81 MG tablet, Take 1 tablet (81 mg total) by mouth daily., Disp: 30 tablet, Rfl: ;  carvedilol (COREG) 6.25 MG tablet, Take 1 tablet (6.25 mg total) by mouth 2 (two) times  daily with a meal., Disp: 60 tablet, Rfl: 6;  clopidogrel (PLAVIX) 75 MG tablet, Take 1 tablet (75 mg total) by mouth daily with breakfast., Disp: 30 tablet, Rfl: 6 diclofenac sodium (VOLTAREN) 1 % GEL, Apply 1 application topically daily. Usually on hands, Disp: , Rfl: ;  diphenhydrAMINE (BENADRYL) 25 MG tablet, Take 25 mg by mouth every 6 (six) hours as needed. For allergies, Disp: , Rfl: ;  hydrochlorothiazide (HYDRODIURIL) 25 MG tablet, Take 1 tablet (25 mg total) by mouth daily., Disp: 30 tablet, Rfl: 9 indomethacin (INDOCIN) 50 MG capsule, Take 1 capsule (50 mg total) by mouth 2 (two) times daily with a meal., Disp: 60 capsule, Rfl: 3;  metFORMIN (GLUCOPHAGE-XR) 750 MG 24 hr tablet, , Disp: , Rfl: ;  naproxen (NAPROSYN) 500 MG tablet, Take 1 tablet (500 mg total) by mouth 2 (two) times daily as needed (pain). Take with food, Disp: 40 tablet, Rfl: 0 rosuvastatin (CRESTOR) 20 MG tablet, Take 1 tablet (20 mg total) by mouth daily., Disp: 30 tablet, Rfl: 11;  varenicline (CHANTIX CONTINUING MONTH PAK) 1 MG tablet, Take 1 tablet (1 mg total) by mouth 2 (two) times daily., Disp: 60 tablet, Rfl: 5 varenicline (CHANTIX STARTING MONTH PAK) 0.5 MG X 11 & 1 MG X 42 tablet, Take one 0.5 mg tablet once daily for 3 days, then increase to one 0.5 mg tab twice daily  for 4 days,  increase to one 1 mg tab twice daily., Disp: 53 tablet, Rfl: 0;  colchicine 0.6 MG tablet, Take 0.6 mg by mouth 2 (two) times daily as needed. gout, Disp: , Rfl:   BP 154/70  Pulse 67  Ht 5\' 4"  (1.626 m)  Wt 146 lb (66.225 kg)  BMI 25.05 kg/m2  SpO2 100%  Body mass index is 25.05 kg/(m^2).          Review of Systems denies chest pain, dyspnea on exertion, PND, orthopnea, hemoptysis.    Objective:   Physical Exam blood pressure 154/70 heart rate 67 respirations 16 Gen.-alert and oriented x3 in no apparent distress HEENT normal for age Lungs no rhonchi or wheezing Cardiovascular regular rhythm no murmurs carotid pulses 3+  palpable no bruits audible on right, soft bruit on the left. Abdomen soft nontender no palpable masses Musculoskeletal free of  major deformities Skin clear -no rashes Neurologic normal Lower extremities 3+ femoral and 2+ PT on the left and 1+ DP on right  Today I ordered a carotid duplex exam which are reviewed and interpreted. It confirms a right ICA occlusion. There is moderate narrowing of the left ICA in the 60-70% range which has not progressed.       Assessment:     Right ICA occlusion and moderate left ICA stenosis-asymptomatic    Plan:     Return in 6 months for followup carotid duplex exam unless patient develops neurologic symptoms in the interim Continue daily aspirin

## 2012-07-12 ENCOUNTER — Other Ambulatory Visit: Payer: Self-pay | Admitting: Family Medicine

## 2012-07-15 ENCOUNTER — Other Ambulatory Visit: Payer: Self-pay | Admitting: *Deleted

## 2012-07-15 DIAGNOSIS — Z48812 Encounter for surgical aftercare following surgery on the circulatory system: Secondary | ICD-10-CM

## 2012-09-05 ENCOUNTER — Other Ambulatory Visit (INDEPENDENT_AMBULATORY_CARE_PROVIDER_SITE_OTHER): Payer: 59

## 2012-09-05 DIAGNOSIS — E785 Hyperlipidemia, unspecified: Secondary | ICD-10-CM

## 2012-09-05 DIAGNOSIS — I10 Essential (primary) hypertension: Secondary | ICD-10-CM

## 2012-09-05 DIAGNOSIS — E119 Type 2 diabetes mellitus without complications: Secondary | ICD-10-CM

## 2012-09-05 LAB — LIPID PANEL
HDL: 55.2 mg/dL (ref >39.00)
Total CHOL/HDL Ratio: 2
Triglycerides: 118 mg/dL (ref 0.0–149.0)
VLDL: 23.6 mg/dL (ref 0.0–40.0)

## 2012-09-05 LAB — COMPREHENSIVE METABOLIC PANEL
ALT: 15 U/L (ref 0–35)
AST: 23 U/L (ref 0–37)
Alkaline Phosphatase: 61 U/L (ref 39–117)
BUN: 23 mg/dL (ref 6–23)
Chloride: 109 mEq/L (ref 96–112)
Creatinine, Ser: 1 mg/dL (ref 0.4–1.2)
Total Bilirubin: 0.8 mg/dL (ref 0.3–1.2)

## 2012-09-05 LAB — HEMOGLOBIN A1C: Hgb A1c MFr Bld: 6 % (ref 4.6–6.5)

## 2012-09-09 ENCOUNTER — Ambulatory Visit (INDEPENDENT_AMBULATORY_CARE_PROVIDER_SITE_OTHER): Payer: 59 | Admitting: Family Medicine

## 2012-09-09 ENCOUNTER — Encounter: Payer: Self-pay | Admitting: Family Medicine

## 2012-09-09 VITALS — BP 120/68 | HR 70 | Temp 98.1°F | Ht 64.0 in | Wt 153.0 lb

## 2012-09-09 DIAGNOSIS — E119 Type 2 diabetes mellitus without complications: Secondary | ICD-10-CM

## 2012-09-09 DIAGNOSIS — E785 Hyperlipidemia, unspecified: Secondary | ICD-10-CM

## 2012-09-09 DIAGNOSIS — I10 Essential (primary) hypertension: Secondary | ICD-10-CM

## 2012-09-09 DIAGNOSIS — I2510 Atherosclerotic heart disease of native coronary artery without angina pectoris: Secondary | ICD-10-CM

## 2012-09-09 NOTE — Patient Instructions (Addendum)
Keep working on quitting smoking. Work on [employer] and increase exercise as able. Follow up in 6 months for CPX with fasting labs prior.

## 2012-09-09 NOTE — Assessment & Plan Note (Signed)
Asymptomatic. 

## 2012-09-09 NOTE — Assessment & Plan Note (Signed)
Well controlled. Continue current medication.  

## 2012-09-09 NOTE — Progress Notes (Signed)
62 year old femlae presents for 3 month follow up.   Had CEA in 08/2011... Had complication with swallowing after Surgery. improving per barium swallow and pt report.    Moderate nonobstructive coronary artery disease in the left anterior descending artery: No revascularization was needed. Cardiology recommend aggressive medical therapy of her risk factors.  She currently has no symptoms suggestive of angina or heart failure.   Diabetes: Great control on glucophage alone.  Lab Results  Component Value Date   HGBA1C 6.0 09/05/2012   Using medications without difficulties: None  Hypoglycemic episodes:none  Hyperglycemic episodes:none  Feet problems:none  Blood Sugars averaging: checking once a day...102  Wt Readings from Last 3 Encounters:  09/09/12 153 lb (69.4 kg)  07/08/12 146 lb (66.225 kg)  06/17/12 148 lb 8 oz (67.359 kg)  Eye exam: 06/2012  Hypertension: well controlled on HCTZ , had angioedema with lisinopril. Now on coreg.  Using medication without problems or lightheadedness: None  Chest pain with exertion:None  Edema:None  Short of breath:None  Average home BPs: not checking lately.   Elevated Cholesterol: Now at goal BACK on crestor 20 mg. Goal <70 given carotid stenosis Lab Results  Component Value Date   CHOL 137 09/05/2012   HDL 55.20 09/05/2012   LDLCALC 58 09/05/2012   LDLDIRECT 139.7 03/28/2012   TRIG 118.0 09/05/2012   CHOLHDL 2 09/05/2012   Diet: Moderate  Exercise: Occ Other complaints:  She is working on quitting smoking. 2-3 cigs a day now.   Gout, only one flare in last year, ;last week! Self limited.. Using colchicine and indocin prn rarely.   Review of Systems  Constitutional: Negative for fever and fatigue.  HENT: Negative for ear pain.  Eyes: Negative for pain.  Respiratory: Negative for chest tightness and shortness of breath.  Cardiovascular: Negative for chest pain, palpitations and leg swelling.  Gastrointestinal: Negative for abdominal  pain.  Genitourinary: Negative for dysuria.  Objective:   Physical Exam  Constitutional: Vital signs are normal. She appears well-developed and well-nourished. She is cooperative. Non-toxic appearance. She does not appear ill. No distress.  HENT:  Head: Normocephalic.  Right Ear: Hearing, tympanic membrane, external ear and ear canal normal. Tympanic membrane is not erythematous, not retracted and not bulging.  Left Ear: Hearing, tympanic membrane, external ear and ear canal normal. Tympanic membrane is not erythematous, not retracted and not bulging.  Nose: No mucosal edema or rhinorrhea. Right sinus exhibits no maxillary sinus tenderness and no frontal sinus tenderness. Left sinus exhibits no maxillary sinus tenderness and no frontal sinus tenderness.  Mouth/Throat: Uvula is midline, oropharynx is clear and moist and mucous membranes are normal.  Eyes: Conjunctivae normal, EOM and lids are normal. Pupils are equal, round, and reactive to light. No foreign bodies found.  Neck: Trachea normal and normal range of motion. Neck supple. Carotid bruit is not present. No mass and no thyromegaly present.  Cardiovascular: Normal rate, regular rhythm, S1 normal, S2 normal, normal heart sounds, intact distal pulses and normal pulses. Exam reveals no gallop and no friction rub.  No murmur heard. No bruit,CEA scarring on neck  Pulmonary/Chest: Effort normal and breath sounds normal. Not tachypneic. No respiratory distress. She has no decreased breath sounds. She has no wheezes. She has no rhonchi. She has no rales.  Abdominal: Soft. Normal appearance and bowel sounds are normal. There is no tenderness.  Neurological: She is alert.  Skin: Skin is warm, dry and intact. No rash noted.  Psychiatric: Her speech is  normal and behavior is normal. Judgment and thought content normal. Her mood appears not anxious. Cognition and memory are normal. She does not exhibit a depressed mood.   Diabetic foot exam: Normal  inspection No skin breakdown No calluses  Normal DP pulses Normal sensation to light touch and monofilament Nails normal

## 2012-09-09 NOTE — Assessment & Plan Note (Signed)
Now back at goal on crestor. Encouraged exercise, weight loss, healthy eating habits.

## 2012-10-10 ENCOUNTER — Other Ambulatory Visit: Payer: Self-pay | Admitting: Family Medicine

## 2012-10-10 ENCOUNTER — Other Ambulatory Visit (HOSPITAL_COMMUNITY): Payer: Self-pay | Admitting: Physician Assistant

## 2012-10-29 ENCOUNTER — Ambulatory Visit (INDEPENDENT_AMBULATORY_CARE_PROVIDER_SITE_OTHER): Payer: 59 | Admitting: Family Medicine

## 2012-10-29 ENCOUNTER — Encounter: Payer: Self-pay | Admitting: Family Medicine

## 2012-10-29 VITALS — BP 128/72 | HR 84 | Temp 98.4°F | Ht 64.0 in | Wt 153.2 lb

## 2012-10-29 DIAGNOSIS — N760 Acute vaginitis: Secondary | ICD-10-CM

## 2012-10-29 DIAGNOSIS — A5901 Trichomonal vulvovaginitis: Secondary | ICD-10-CM | POA: Insufficient documentation

## 2012-10-29 DIAGNOSIS — B379 Candidiasis, unspecified: Secondary | ICD-10-CM

## 2012-10-29 LAB — POCT WET PREP (WET MOUNT): Clue Cells Wet Prep Whiff POC: NEGATIVE

## 2012-10-29 MED ORDER — FLUCONAZOLE 150 MG PO TABS: 150.0000 mg | ORAL_TABLET | Freq: Once | ORAL | Status: DC

## 2012-10-29 MED ORDER — METRONIDAZOLE 500 MG PO TABS: 2000.0000 mg | ORAL_TABLET | Freq: Once | ORAL | Status: DC

## 2012-10-29 NOTE — Progress Notes (Signed)
[facility] [address] [place] [address] Phone: [phone] Fax: [phone]  Date:  10/29/2012   Name:  April Shaffer   DOB:  March 19, 1950   MRN:  [id number] Gender: female Age: 62 y.o.  Primary Physician:  Kerby Nora, MD  Evaluating MD: Hannah Beat, MD   Chief Complaint: Vaginitis   History of Present Illness:  April Shaffer is a 62 y.o. pleasant patient who presents with the following:  Pleasant lady who presents with some vaginal irritation and discharge. There is some itching. She denies any douche area. No abdominal pain. No flank pain. She denies any specific STD exposure, and she only has one sexual partner.  Patient Active Problem List   Diagnosis Date Noted  . Trichomonal vaginitis 10/29/2012  . Aftercare following surgery of the circulatory system, NEC 07/08/2012  . Coronary artery disease   . Carotid artery stenosis s/p CEA 2013 10/16/2011  . Occlusion and stenosis of carotid artery without mention of cerebral infarction 09/17/2011  . Carotid bruit 08/28/2011  . Gout of big toe 04/13/2011  . UNSPECIFIED ANEMIA 08/12/2009  . DIABETES MELLITUS, TYPE II 04/02/2008  . HYPERLIPIDEMIA 04/02/2008  . HYPERTENSION 04/02/2008  . DEGENERATIVE DISC DISEASE, LUMBOSACRAL SPINE 04/02/2008    Past Medical History  Diagnosis Date  . Diabetes mellitus, type 2   . Hypertension   . Hyperlipidemia   . Sleep apnea     hx had tonsilectomy and no longer has   . Arthritis   . Carotid artery narrowing   . Coronary artery disease     Cardiac catheterization November 2013: 50% ostial LAD stenosis 50% mid stenosis. 30% disease in the left circumflex.    Past Surgical History  Procedure Laterality Date  . Varicose vein surgery  2008    stripping  . Gyn surgery  2004    total hysterectomy for mennorhagia,,salpingoophorectomy  . Epidural block injection  02/2008    Drs. Dorthula Nettles  . Tonsillectomy    . Endarterectomy   09/27/2011    Procedure: ENDARTERECTOMY CAROTID;  Surgeon: Pryor Ochoa, MD;  Location: [facility] OR;  Service: Vascular;  Laterality: Right;  . Cardiac catheterization      History   Social History  . Marital Status: Divorced    Spouse Name: N/A    Number of Children: 2  . 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 Smoking status: Current Some Day Smoker -- 0.30 packs/day for 25 years    Types: Cigarettes  . Smokeless tobacco: Never Used     Comment: pt states she smokes about 3-4 cigs per day  . Alcohol Use: 1.8 oz/week    3 Shots of liquor per week     Comment: 2 drinks a day  . Drug Use: No  . Sexual Activity: Not on file   Other Topics Concern  . Not on file   Social History Narrative   Regular exercise- only at work, walking   Diet- diabetic diet    Family History  Problem Relation Age of Onset  . Hypertension Sister   . Hypothyroidism Sister   . Cancer Maternal Grandmother     lung    Allergies  Allergen Reactions  . Lisinopril-Hydrochlorothiazide Other (See Comments)    angioedema  . Codeine Itching  . Oxycodone   . Tramadol Itching    Medication list has been  reviewed and updated.  Outpatient Prescriptions Prior to Visit  Medication Sig Dispense Refill  . aspirin 81 MG tablet Take 1 tablet (81 mg total) by mouth daily.  30 tablet    . carvedilol (COREG) 6.25 MG tablet Take 1 tablet (6.25 mg total) by mouth 2 (two) times daily with a meal.  60 tablet  6  . clopidogrel (PLAVIX) 75 MG tablet TAKE ONE TABLET BY MOUTH EVERY DAY WITH BREAKFAST  30 tablet  6  . colchicine 0.6 MG tablet Take 0.6 mg by mouth 2 (two) times daily as needed. gout      . diclofenac sodium (VOLTAREN) 1 % GEL Apply 1 application topically daily. Usually on hands      . diphenhydrAMINE (BENADRYL) 25 MG tablet Take 25 mg by mouth every 6 (six) hours as needed. For allergies      . hydrochlorothiazide  (HYDRODIURIL) 25 MG tablet Take 1 tablet (25 mg total) by mouth daily.  30 tablet  9  . ibuprofen (ADVIL,MOTRIN) 800 MG tablet TAKE ONE TABLET BY MOUTH EVERY 8 HOURS AS NEEDED FOR PAIN  90 tablet  0  . indomethacin (INDOCIN) 50 MG capsule Take 1 capsule (50 mg total) by mouth 2 (two) times daily with a meal.  60 capsule  3  . metFORMIN (GLUCOPHAGE-XR) 750 MG 24 hr tablet TAKE ONE TABLET BY MOUTH EVERY DAY WITH BREAKFAST  90 tablet  0  . naproxen (NAPROSYN) 500 MG tablet Take 1 tablet (500 mg total) by mouth 2 (two) times daily as needed (pain). Take with food  40 tablet  0  . rosuvastatin (CRESTOR) 20 MG tablet Take 1 tablet (20 mg total) by mouth daily.  30 tablet  11  . metFORMIN (GLUCOPHAGE-XR) 750 MG 24 hr tablet       . VOLTAREN 1 % GEL APPLY ONE APPLICATION OF GEL DAILY  100 g  0   No facility-administered medications prior to visit.    Review of Systems:   GEN: No acute illnesses, no fevers, chills. GI: No n/v/d, eating normally Pulm: No SOB Interactive and getting along well at home.  Otherwise, ROS is as per the HPI.   Physical Examination: BP 128/72  Pulse 84  Temp(Src) 98.4 F (36.9 C) (Oral)  Ht 5\' 4"  (1.626 m)  Wt 153 lb 4 oz (69.514 kg)  BMI 26.29 kg/m2  Ideal Body Weight: Weight in (lb) to have BMI = 25: 145.3   GEN: WDWN, NAD, Non-toxic, Alert & Oriented x 3 HEENT: Atraumatic, Normocephalic.  Ears and Nose: No external deformity. EXTR: No clubbing/cyanosis/edema NEURO: Normal gait.  PSYCH: Normally interactive. Conversant. Not depressed or anxious appearing.  Calm demeanor.  GU: Normal introitus. Scant discharge. Sample obtained.  Assessment and Plan:  Trichomonal vaginitis  Vaginitis and vulvovaginitis - Plan: POCT Wet Prep (Wet Mount)  Yeast infection  Microscopic wet-mount exam shows hyphae, trichomonads. Yeast. No clue cells.  Counselled. Diflucan and flagyl   Orders Today:  Orders Placed This Encounter  Procedures  . POCT Wet Prep [facility])    Updated Medication List: (Includes new medications, updates to list, dose adjustments) Meds ordered this encounter  Medications  . metroNIDAZOLE (FLAGYL) 500 MG tablet    Sig: Take 4 tablets (2,000 mg total) by mouth once.    Dispense:  4 tablet    Refill:  0  . fluconazole (DIFLUCAN) 150 MG tablet    Sig: Take 1 tablet (150 mg total) by mouth once.    Dispense:  2 tablet    Refill:  0    Medications Discontinued: Medications Discontinued During This Encounter  Medication Reason  . metFORMIN (GLUCOPHAGE-XR) 750 MG 24 hr tablet Duplicate  . VOLTAREN 1 % GEL Duplicate      Signed, Shatima Zalar T. Anja Neuzil, MD 10/29/2012 5:28 PM

## 2012-11-25 ENCOUNTER — Other Ambulatory Visit: Payer: Self-pay | Admitting: Family Medicine

## 2012-11-25 DIAGNOSIS — Z1231 Encounter for screening mammogram for malignant neoplasm of breast: Secondary | ICD-10-CM

## 2012-12-19 ENCOUNTER — Ambulatory Visit (HOSPITAL_COMMUNITY)
Admission: RE | Admit: 2012-12-19 | Discharge: 2012-12-19 | Disposition: A | Discharge status: Home / Self Care | Payer: 59 | Source: Ambulatory Visit | Attending: Family Medicine | Admitting: Family Medicine

## 2012-12-19 DIAGNOSIS — Z1231 Encounter for screening mammogram for malignant neoplasm of breast: Secondary | ICD-10-CM | POA: Insufficient documentation

## 2013-01-09 ENCOUNTER — Other Ambulatory Visit: Payer: Self-pay | Admitting: Family Medicine

## 2013-01-13 ENCOUNTER — Other Ambulatory Visit: Payer: 59

## 2013-01-13 ENCOUNTER — Ambulatory Visit: Payer: 59 | Admitting: Vascular Surgery

## 2013-01-19 ENCOUNTER — Encounter: Payer: Self-pay | Admitting: Family

## 2013-01-20 ENCOUNTER — Encounter: Payer: Self-pay | Admitting: Family

## 2013-01-20 ENCOUNTER — Ambulatory Visit (HOSPITAL_COMMUNITY)
Admission: RE | Admit: 2013-01-20 | Discharge: 2013-01-20 | Disposition: A | Discharge status: Home / Self Care | Payer: 59 | Source: Ambulatory Visit | Attending: Family | Admitting: Family

## 2013-01-20 ENCOUNTER — Ambulatory Visit (INDEPENDENT_AMBULATORY_CARE_PROVIDER_SITE_OTHER): Payer: 59 | Admitting: Family

## 2013-01-20 DIAGNOSIS — Z48812 Encounter for surgical aftercare following surgery on the circulatory system: Secondary | ICD-10-CM | POA: Insufficient documentation

## 2013-01-20 DIAGNOSIS — I6529 Occlusion and stenosis of unspecified carotid artery: Secondary | ICD-10-CM

## 2013-01-20 NOTE — Addendum Note (Signed)
Addended by: Dannielle Karvonen on: 01/20/2013 03:51 PM   Modules accepted: Orders

## 2013-01-20 NOTE — Progress Notes (Signed)
Established Carotid Patient  History of Present Illness  April Shaffer is a 62 y.o. female patient of Dr. Arbie Cookey status post right CEA on 09/27/11 with subsequent occlusion.  Patient has Negative history of TIA or stroke symptom.  The patient denies amaurosis fugax or monocular blindness.  The patient  denies facial drooping.  Pt. denies hemiplegia.  The patient denies receptive or expressive aphasia.  Pt. denies extremity weakness. Patient reports that her blood pressure is usually about 126 systolic, goes up in Dr. offices.   Patient denies New Medical or Surgical History.  Pt Diabetic: Yes, states in good control Pt smoker: smoker  (2-3 cigarettes/day x 40+ yrs)  Pt meds include: Statin : Yes Betablocker: Yes ASA: Yes Other anticoagulants/antiplatelets: Plavix   Past Medical History  Diagnosis Date  . Diabetes mellitus, type 2   . Hypertension   . Hyperlipidemia   . Sleep apnea     hx had tonsilectomy and no longer has   . Arthritis   . Carotid artery narrowing   . Coronary artery disease     Cardiac catheterization November 2013: 50% ostial LAD stenosis 50% mid stenosis. 30% disease in the left circumflex.    Social History History  Substance Use Topics  . Smoking status: Current Some Day Smoker -- 0.30 packs/day for 25 years    Types: Cigarettes  . Smokeless tobacco: Never Used     Comment: pt states she smokes about 3-4 cigs per day  . Alcohol Use: 1.2 oz/week    2 Shots of liquor per week     Comment: 2 drinks a day    Family History Family History  Problem Relation Age of Onset  . Hypertension Sister   . Hypothyroidism Sister   . Cancer Maternal Grandmother     lung    Surgical History Past Surgical History  Procedure Laterality Date  . Varicose vein surgery  2008    stripping  . Gyn surgery  2004    total hysterectomy for mennorhagia,,salpingoophorectomy  . Epidural block injection  02/2008    Drs. Dorthula Nettles  . Tonsillectomy    .  Endarterectomy  09/27/2011    Procedure: ENDARTERECTOMY CAROTID;  Surgeon: Pryor Ochoa, MD;  Location: [facility] OR;  Service: Vascular;  Laterality: Right;  . Cardiac catheterization      Allergies  Allergen Reactions  . Lisinopril-Hydrochlorothiazide Other (See Comments)    angioedema  . Codeine Itching  . Oxycodone   . Tramadol Itching    Current Outpatient Prescriptions  Medication Sig Dispense Refill  . aspirin 81 MG tablet Take 1 tablet (81 mg total) by mouth daily.  30 tablet    . carvedilol (COREG) 6.25 MG tablet Take 1 tablet (6.25 mg total) by mouth 2 (two) times daily with a meal.  60 tablet  6  . clopidogrel (PLAVIX) 75 MG tablet TAKE ONE TABLET BY MOUTH EVERY DAY WITH BREAKFAST  30 tablet  6  . diclofenac sodium (VOLTAREN) 1 % GEL Apply 1 application topically daily. Usually on hands      . diphenhydrAMINE (BENADRYL) 25 MG tablet Take 25 mg by mouth every 6 (six) hours as needed. For allergies      . fluconazole (DIFLUCAN) 150 MG tablet Take 1 tablet (150 mg total) by mouth once.  2 tablet  0  . hydrochlorothiazide (HYDRODIURIL) 25 MG tablet TAKE ONE TABLET BY MOUTH EVERY DAY  30 tablet  5  . ibuprofen (ADVIL,MOTRIN) 800 MG tablet TAKE ONE TABLET  BY MOUTH EVERY 8 HOURS AS NEEDED FOR PAIN  90 tablet  0  . indomethacin (INDOCIN) 50 MG capsule Take 1 capsule (50 mg total) by mouth 2 (two) times daily with a meal.  60 capsule  3  . metFORMIN (GLUCOPHAGE-XR) 750 MG 24 hr tablet TAKE ONE TABLET BY MOUTH EVERY DAY WITH BREAKFAST  90 tablet  0  . metroNIDAZOLE (FLAGYL) 500 MG tablet Take 4 tablets (2,000 mg total) by mouth once.  4 tablet  0  . naproxen (NAPROSYN) 500 MG tablet Take 1 tablet (500 mg total) by mouth 2 (two) times daily as needed (pain). Take with food  40 tablet  0  . rosuvastatin (CRESTOR) 20 MG tablet Take 1 tablet (20 mg total) by mouth daily.  30 tablet  11  . colchicine 0.6 MG tablet Take 0.6 mg by mouth 2 (two) times daily as needed. gout       No current  facility-administered medications for this visit.    Review of Systems : [x]  Positive   [ ]  Denies  General:[ ]  Weight loss,  [ ]  Weight gain, [ ]  Loss of appetite, [ ]  Fever, [ ]  chills  Neurologic: [ ]  Dizziness, [ ]  Blackouts, [ ]  Headaches, [ ]  Seizure [ ]  Stroke, [ ]  "Mini stroke", [ ]  Slurred speech, [ ]  Temporary blindness;  [ ] weakness,  Ear/Nose/Throat: [ ]  Change in hearing, [ ]  Nose bleeds, [ ]  Hoarseness  Vascular:[ ]  Pain in legs with walking, [ ]  Pain in feet while lying flat , [ ]   Non-healing ulcer, [ ]  Blood clot in vein,    Pulmonary: [ ]  Home oxygen, [ ]   Productive cough, [ ]  Bronchitis, [ ]  Coughing up blood,  [ ]  Asthma, [ ]  Wheezing  Musculoskeletal:  [ ]  Arthritis, [ ]  Joint pain, [ ]  low back pain  Cardiac: [ ]  Chest pain, [ ]  Shortness of breath when lying flat, [ ]  Shortness of breath with exertion, [ ]  Palpitations, [ ]  Heart murmur, [ ]   Atrial fibrillation  Hematologic:[ ]  Easy Bruising, [ ]  Anemia; [ ]  Hepatitis  Psychiatric: [ ]   Depression, [ ]  Anxiety   Gastrointestinal: [ ]  Black stool, [ ]  Blood in stool, [ ]  Peptic ulcer disease,  [ ]  Gastroesophageal Reflux, [ ]  Trouble swallowing, [ ]  Diarrhea, [ ]  Constipation  Urinary: [ ]  chronic Kidney disease, [ ]  on HD, [ ]  Burning with urination, [ ]  Frequent urination, [ ]  Difficulty urinating;   Skin: [ ]  Rashes, [ ]  Wounds    Physical Examination  Filed Vitals:   01/20/13 1230  BP: 180/94  Pulse:   Resp:    Filed Weights   01/20/13 1228  Weight: 152 lb 11.2 oz (69.264 kg)   Body mass index is 26.2 kg/(m^2).  General: WDWN female in NAD GAIT: normal Eyes: PERRLA Pulmonary:  CTAB, Negative  Rales, Negative rhonchi, & Negative wheezing.  Cardiac: regular Rhythm ,  Negative Murmurs.  VASCULAR EXAM Carotid Bruits Left Right   Positive Negative    Radial pulses are 2+ palpable and equal.  LE Pulses LEFT RIGHT       POPLITEAL  not palpable   not palpable       POSTERIOR TIBIAL   palpable    palpable        DORSALIS PEDIS      ANTERIOR TIBIAL  palpable   palpable     Gastrointestinal: soft, nontender, BS WNL, no r/g,  negative masses.  Musculoskeletal: Negative muscle atrophy/wasting. M/S 5/5 throughout, Extremities without ischemic changes.  Neurologic: A&O X 3; Appropriate Affect ; SENSATION ;normal;  Speech is normal CN 2-12 intact, Pain and light touch intact in extremities, Motor exam as listed above.   Non-Invasive Vascular Imaging CAROTID DUPLEX 01/20/2013   Right ICA: occluded. Left ICA: 60 - 79 % stenosis.  These findings are Unchanged from previous exam.  Assessment: LORMA HEATER is a 62 y.o. female who presents with asymptomatic occluded right ICA s/p CEA, and 60 - 79 % Left ICA  Stenosis. The  ICA stenosis is  Unchanged from previous exam. She smokes a couple of cigarettes/day, was counseled re smoking cessation.  Plan: Follow-up in 6 months with Carotid Duplex scan.   I discussed in depth with the patient the nature of atherosclerosis, and emphasized the importance of maximal medical management including strict control of blood pressure, blood glucose, and lipid levels, obtaining regular exercise, and cessation of smoking.  The patient is aware that without maximal medical management the underlying atherosclerotic disease process will progress, limiting the benefit of any interventions. The patient was given information about stroke prevention and what symptoms should prompt the patient to seek immediate medical care. Thank you for allowing [place] to participate in this patient's care.  Charisse March, RN, MSN, FNP-C [facility] Office: [phone]  Clinic Physician: Early   01/20/2013 1:10 PM

## 2013-01-20 NOTE — Patient Instructions (Addendum)
Stroke Prevention Some medical conditions and behaviors are associated with an increased chance of having a stroke. You may prevent a stroke by making healthy choices and managing medical conditions. Reduce your risk of having a stroke by:  Staying physically active. Get at least 30 minutes of activity on most or all days.  Not smoking. It may also be helpful to avoid exposure to secondhand smoke.  Limiting alcohol use. Moderate alcohol use is considered to be:  No more than 2 drinks per day for men.  No more than 1 drink per day for nonpregnant women.  Eating healthy foods.  Include 5 or more servings of fruits and vegetables a day.  Certain diets may be prescribed to address high blood pressure, high cholesterol, diabetes, or obesity.  Managing your cholesterol levels.  A low-saturated fat, low-trans fat, low-cholesterol, and high-fiber diet may control cholesterol levels.  Take any prescribed medicines to control cholesterol as directed by your caregiver.  Managing your diabetes.  A controlled-carbohydrate, controlled-sugar diet is recommended to manage diabetes.  Take any prescribed medicines to control diabetes as directed by your caregiver.  Controlling your high blood pressure (hypertension).  A low-salt (sodium), low-saturated fat, low-trans fat, and low-cholesterol diet is recommended to manage high blood pressure.  Take any prescribed medicines to control hypertension as directed by your caregiver.  Maintaining a healthy weight.  A reduced-calorie, low-sodium, low-saturated fat, low-trans fat, low-cholesterol diet is recommended to manage weight.  Stopping drug abuse.  Avoiding birth control pills.  Talk to your caregiver about the risks of taking birth control pills if you are over 4 years old, smoke, get migraines, or have ever had a blood clot.  Getting evaluated for sleep disorders (sleep apnea).  Talk to your caregiver about getting a sleep evaluation  if you snore a lot or have excessive sleepiness.  Taking medicines as directed by your caregiver.  For some people, aspirin or blood thinners (anticoagulants) are helpful in reducing the risk of forming abnormal blood clots that can lead to stroke. If you have the irregular heart rhythm of atrial fibrillation, you should be on a blood thinner unless there is a good reason you cannot take them.  Understand all your medicine instructions. SEEK IMMEDIATE MEDICAL CARE IF:   You have sudden weakness or numbness of the face, arm, or leg, especially on one side of the body.  You have sudden confusion.  You have trouble speaking (aphasia) or understanding.  You have sudden trouble seeing in one or both eyes.  You have sudden trouble walking.  You have dizziness.  You have a loss of balance or coordination.  You have a sudden, severe headache with no known cause.  You have new chest pain or an irregular heartbeat. Any of these symptoms may represent a serious problem that is an emergency. Do not wait to see if the symptoms will go away. Get medical help right away. Call your local emergency services (911 in [place] Do not drive yourself to the hospital. Document Released: 03/22/2004 Document Revised: 05/07/2011 Document Reviewed: 08/15/2012 [facility] Patient Information 2014 [place], [place].   Smoking Cessation, Tips for Success YOU CAN QUIT SMOKING If you are ready to quit smoking, congratulations! You have chosen to help yourself be healthier. Cigarettes bring nicotine, tar, carbon monoxide, and other irritants into your body. Your lungs, heart, and blood vessels will be able to work better without these poisons. There are many different ways to quit smoking. Nicotine gum, nicotine patches, a nicotine inhaler,  or nicotine nasal spray can help with physical craving. Hypnosis, support groups, and medicines help break the habit of smoking. Here are some tips to help you quit for good.  Throw  away all cigarettes.  Clean and remove all ashtrays from your home, work, and car.  On a card, write down your reasons for quitting. Carry the card with you and read it when you get the urge to smoke.  Cleanse your body of nicotine. Drink enough water and fluids to keep your urine clear or pale yellow. Do this after quitting to flush the nicotine from your body.  Learn to predict your moods. Do not let a bad situation be your excuse to have a cigarette. Some situations in your life might tempt you into wanting a cigarette.  Never have "just one" cigarette. It leads to wanting another and another. Remind yourself of your decision to quit.  Change habits associated with smoking. If you smoked while driving or when feeling stressed, try other activities to replace smoking. Stand up when drinking your coffee. Brush your teeth after eating. Sit in a different chair when you read the paper. Avoid alcohol while trying to quit, and try to drink fewer caffeinated beverages. Alcohol and caffeine may urge you to smoke.  Avoid foods and drinks that can trigger a desire to smoke, such as sugary or spicy foods and alcohol.  Ask people who smoke not to smoke around you.  Have something planned to do right after eating or having a cup of coffee. Take a walk or exercise to perk you up. This will help to keep you from overeating.  Try a relaxation exercise to calm you down and decrease your stress. Remember, you may be tense and nervous for the first 2 weeks after you quit, but this will pass.  Find new activities to keep your hands busy. Play with a pen, coin, or rubber band. Doodle or draw things on paper.  Brush your teeth right after eating. This will help cut down on the craving for the taste of tobacco after meals. You can try mouthwash, too.  Use oral substitutes, such as lemon drops, carrots, a cinnamon stick, or chewing gum, in place of cigarettes. Keep them handy so they are available when you have  the urge to smoke.  When you have the urge to smoke, try deep breathing.  Designate your home as a nonsmoking area.  If you are a heavy smoker, ask your caregiver about a prescription for nicotine chewing gum. It can ease your withdrawal from nicotine.  Reward yourself. Set aside the cigarette money you save and buy yourself something nice.  Look for support from others. Join a support group or smoking cessation program. Ask someone at home or at work to help you with your plan to quit smoking.  Always ask yourself, "Do I need this cigarette or is this just a reflex?" Tell yourself, "Today, I choose not to smoke," or "I do not want to smoke." You are reminding yourself of your decision to quit, even if you do smoke a cigarette. HOW WILL I FEEL WHEN I QUIT SMOKING?  The benefits of not smoking start within days of quitting.  You may have symptoms of withdrawal because your body is used to nicotine (the addictive substance in cigarettes). You may crave cigarettes, be irritable, feel very hungry, cough often, get headaches, or have difficulty concentrating.  The withdrawal symptoms are only temporary. They are strongest when you first quit but will  go away within 10 to 14 days.  When withdrawal symptoms occur, stay in control. Think about your reasons for quitting. Remind yourself that these are signs that your body is healing and getting used to being without cigarettes.  Remember that withdrawal symptoms are easier to treat than the major diseases that smoking can cause.  Even after the withdrawal is over, expect periodic urges to smoke. However, these cravings are generally short-lived and will go away whether you smoke or not. Do not smoke!  If you relapse and smoke again, do not lose hope. Most smokers quit 3 times before they are successful.  If you relapse, do not give up! Plan ahead and think about what you will do the next time you get the urge to smoke. LIFE AS A NONSMOKER: MAKE  IT FOR A MONTH, MAKE IT FOR LIFE Day 1: Hang this page where you will see it every day. Day 2: Get rid of all ashtrays, matches, and lighters. Day 3: Drink water. Breathe deeply between sips. Day 4: Avoid places with smoke-filled air, such as bars, clubs, or the smoking section of restaurants. Day 5: Keep track of how much money you save by not smoking. Day 6: Avoid boredom. Keep a good book with you or go to the movies. Day 7: Reward yourself! One week without smoking! Day 8: Make a dental appointment to get your teeth cleaned. Day 9: Decide how you will turn down a cigarette before it is offered to you. Day 10: Review your reasons for quitting. Day 11: Distract yourself. Stay active to keep your mind off smoking and to relieve tension. Take a walk, exercise, read a book, do a crossword puzzle, or try a new hobby. Day 12: Exercise. Get off the bus before your stop or use stairs instead of escalators. Day 13: Call on friends for support and encouragement. Day 14: Reward yourself! Two weeks without smoking! Day 15: Practice deep breathing exercises. Day 16: Bet a friend that you can stay a nonsmoker. Day 17: Ask to sit in nonsmoking sections of restaurants. Day 18: Hang up "No Smoking" signs. Day 19: Think of yourself as a nonsmoker. Day 20: Each morning, tell yourself you will not smoke. Day 21: Reward yourself! Three weeks without smoking! Day 22: Think of smoking in negative ways. Remember how it stains your teeth, gives you bad breath, and leaves you short of breath. Day 23: Eat a nutritious breakfast. Day 24:Do not relive your days as a smoker. Day 25: Hold a pencil in your hand when talking on the telephone. Day 26: Tell all your friends you do not smoke. Day 27: Think about how much better food tastes. Day 28: Remember, one cigarette is one too many. Day 29: Take up a hobby that will keep your hands busy. Day 30: Congratulations! One month without smoking! Give yourself a big  reward. Your caregiver can direct you to community resources or hospitals for support, which may include:  Group support.  Education.  Hypnosis.  Subliminal therapy. Document Released: 11/11/2003 Document Revised: 05/07/2011 Document Reviewed: 07/31/2012 [facility] Patient Information 2014 [place], [place].

## 2013-01-26 ENCOUNTER — Other Ambulatory Visit: Payer: Self-pay | Admitting: *Deleted

## 2013-01-26 MED ORDER — METFORMIN HCL ER 750 MG PO TB24: ORAL_TABLET | ORAL | Status: DC

## 2013-01-26 NOTE — Telephone Encounter (Signed)
Received faxed refill request from pharmacy. Refill sent to pharmacy electronically. 

## 2013-02-26 ENCOUNTER — Other Ambulatory Visit: Payer: Self-pay | Admitting: Family Medicine

## 2013-02-27 NOTE — Telephone Encounter (Signed)
Ok to refill 

## 2013-03-04 ENCOUNTER — Other Ambulatory Visit: Payer: Self-pay | Admitting: *Deleted

## 2013-03-04 MED ORDER — NAPROXEN 500 MG PO TABS: 500.0000 mg | ORAL_TABLET | Freq: Two times a day (BID) | ORAL | Status: DC | PRN

## 2013-03-04 NOTE — Telephone Encounter (Signed)
Last office visit 10/29/2012 with Dr. Patsy Lager[place].  Ok to refill?

## 2013-03-13 ENCOUNTER — Other Ambulatory Visit (INDEPENDENT_AMBULATORY_CARE_PROVIDER_SITE_OTHER): Payer: 59

## 2013-03-13 ENCOUNTER — Telehealth: Payer: Self-pay | Admitting: Family Medicine

## 2013-03-13 DIAGNOSIS — E119 Type 2 diabetes mellitus without complications: Secondary | ICD-10-CM

## 2013-03-13 LAB — LIPID PANEL
CHOL/HDL RATIO: 3
Cholesterol: 141 mg/dL (ref 0–200)
HDL: 47 mg/dL (ref >39.00)
LDL CALC: 76 mg/dL (ref 0–99)
TRIGLYCERIDES: 92 mg/dL (ref 0.0–149.0)
VLDL: 18.4 mg/dL (ref 0.0–40.0)

## 2013-03-13 LAB — COMPREHENSIVE METABOLIC PANEL
ALBUMIN: 3.6 g/dL (ref 3.5–5.2)
ALT: 14 U/L (ref 0–35)
AST: 22 U/L (ref 0–37)
Alkaline Phosphatase: 71 U/L (ref 39–117)
BUN: 22 mg/dL (ref 6–23)
CHLORIDE: 102 meq/L (ref 96–112)
CO2: 30 mEq/L (ref 19–32)
Calcium: 9.2 mg/dL (ref 8.4–10.5)
Creatinine, Ser: 0.9 mg/dL (ref 0.4–1.2)
GFR: 77.91 mL/min (ref >60.00)
GLUCOSE: 110 mg/dL — AB (ref 70–99)
Potassium: 3.4 mEq/L — ABNORMAL LOW (ref 3.5–5.1)
Sodium: 140 mEq/L (ref 135–145)
Total Bilirubin: 0.7 mg/dL (ref 0.3–1.2)
Total Protein: 7.5 g/dL (ref 6.0–8.3)

## 2013-03-13 LAB — HEMOGLOBIN A1C: Hgb A1c MFr Bld: 6.2 % (ref 4.6–6.5)

## 2013-03-13 LAB — MICROALBUMIN / CREATININE URINE RATIO
CREATININE, U: 152.7 mg/dL
MICROALB UR: 2.6 mg/dL — AB (ref 0.0–1.9)
Microalb Creat Ratio: 1.7 mg/g (ref 0.0–30.0)

## 2013-03-13 NOTE — Addendum Note (Signed)
Addended by: Alvina ChouWALSH, Gjon Letarte J on: 03/13/2013 09:36 AM   Modules accepted: Orders

## 2013-03-13 NOTE — Telephone Encounter (Signed)
Message copied by Excell SeltzerBEDSOLE, Modestine Scherzinger E on Fri Mar 13, 2013  8:26 AM ------      Message from: Alvina ChouWALSH, TERRI J      Created: Tue Mar 10, 2013  5:36 PM      Regarding: Lab orders for Friday, 1.16.15       Lab orders for a f/u appt ------

## 2013-03-16 ENCOUNTER — Telehealth: Payer: Self-pay

## 2013-03-16 NOTE — Telephone Encounter (Signed)
Relevant patient education mailed to patient.  

## 2013-03-17 ENCOUNTER — Ambulatory Visit (INDEPENDENT_AMBULATORY_CARE_PROVIDER_SITE_OTHER): Payer: 59 | Admitting: Family Medicine

## 2013-03-17 ENCOUNTER — Encounter: Payer: Self-pay | Admitting: Family Medicine

## 2013-03-17 VITALS — BP 118/64 | HR 65 | Temp 97.8°F | Ht 64.0 in | Wt 153.2 lb

## 2013-03-17 DIAGNOSIS — E1142 Type 2 diabetes mellitus with diabetic polyneuropathy: Secondary | ICD-10-CM | POA: Insufficient documentation

## 2013-03-17 DIAGNOSIS — E785 Hyperlipidemia, unspecified: Secondary | ICD-10-CM

## 2013-03-17 DIAGNOSIS — E1149 Type 2 diabetes mellitus with other diabetic neurological complication: Secondary | ICD-10-CM

## 2013-03-17 DIAGNOSIS — M5431 Sciatica, right side: Secondary | ICD-10-CM

## 2013-03-17 DIAGNOSIS — M5430 Sciatica, unspecified side: Secondary | ICD-10-CM

## 2013-03-17 DIAGNOSIS — I10 Essential (primary) hypertension: Secondary | ICD-10-CM

## 2013-03-17 DIAGNOSIS — E119 Type 2 diabetes mellitus without complications: Secondary | ICD-10-CM

## 2013-03-17 LAB — VITAMIN B12: Vitamin B-12: 226 pg/mL (ref 211–911)

## 2013-03-17 MED ORDER — GABAPENTIN 100 MG PO CAPS: 100.0000 mg | ORAL_CAPSULE | Freq: Every day | ORAL | Status: DC

## 2013-03-17 NOTE — Addendum Note (Signed)
Addended by: Alvina ChouWALSH, TERRI J on: 03/17/2013 12:29 PM   Modules accepted: Orders

## 2013-03-17 NOTE — Assessment & Plan Note (Signed)
Well controlled. Continue current medication. Encouraged exercise, weight loss, healthy eating habits.  

## 2013-03-17 NOTE — Assessment & Plan Note (Signed)
Well-controlled on current meds 

## 2013-03-17 NOTE — Assessment & Plan Note (Signed)
Well controlled. Continue current medication.  

## 2013-03-17 NOTE — Patient Instructions (Addendum)
Keep working on health eating, exercise and weight loss. Schedule CPX with fasting labs prior in 6 months.  Stopa t lab on way out for B12 test. Use ibuprofen as needed for low back pain.  Start low dose gabapentin at bedtime for sciatica as well as neuropathy.

## 2013-03-17 NOTE — Assessment & Plan Note (Signed)
Will check B12. If negative..most likely due to DM.  No ETOH use.  Will start trial of gabapentin.

## 2013-03-17 NOTE — Assessment & Plan Note (Signed)
NSAID, home PT, trial of neurontin.  If not improving will work up further.

## 2013-03-17 NOTE — Progress Notes (Signed)
Moderate nonobstructive coronary artery disease in the left anterior descending artery: No revascularization was needed. Cardiology recommend aggressive medical therapy of her risk factors.  She currently has no symptoms suggestive of angina or heart failure.   Diabetes: Great control on glucophage alone.  Lab Results  Component Value Date   HGBA1C 6.2 03/13/2013  Using medications without difficulties: None  Hypoglycemic episodes:none  Hyperglycemic episodes:none  Feet problems:none  Blood Sugars averaging: checking once a day...102  Wt Readings from Last 3 Encounters:  03/17/13 153 lb 4 oz (69.514 kg)  01/20/13 152 lb 11.2 oz (69.264 kg)  10/29/12 153 lb 4 oz (69.514 kg)  Eye exam: 06/2012  Hypertension: well controlled on HCTZ , had angioedema with lisinopril. Now on coreg.  BP Readings from Last 3 Encounters:  03/17/13 118/64  01/20/13 180/94  10/29/12 128/72  Using medication without problems or lightheadedness: None  Chest pain with exertion:None  Edema:None  Short of breath:None  Average home BPs: not checking lately.   Elevated Cholesterol: At goal on crestor 20 mg. Goal <70 given carotid stenosis  Lab Results  Component Value Date   CHOL 141 03/13/2013   HDL 47.00 03/13/2013   LDLCALC 76 03/13/2013   LDLDIRECT 139.7 03/28/2012   TRIG 92.0 03/13/2013   CHOLHDL 3 03/13/2013  Diet: Moderate  Exercise: Occ  Other complaints:  She is working on quitting smoking. 2-3 cigs a day now.  Chronic right sciatica. She has been having low back pain, radiates down right buttock to upper thigh, occ radiates to foot. No weakness. NO  Groin numbness, no fever. No incontinence.  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 Helped for a while.Marland Kitchen 1-2 month but would always come back. She saw Dr. Magnus Ivan in past. No surgical indication at that time.  Ibuprofen 800mg   helps some, takes occ. Naproxen does not help much    Toes feel achy/ burning bilaterally. When  lies down at night sheets feel weird on feet.  Review of Systems  Constitutional: Negative for fever and fatigue.  HENT: Negative for ear pain.  Eyes: Negative for pain.  Respiratory: Negative for chest tightness and shortness of breath.  Cardiovascular: Negative for chest pain, palpitations and leg swelling.  Gastrointestinal: Negative for abdominal pain.  Genitourinary: Negative for dysuria.  Objective:   Physical Exam  Constitutional: Vital signs are normal. She appears well-developed and well-nourished. She is cooperative. Non-toxic appearance. She does not appear ill. No distress.  HENT:  Head: Normocephalic.  Right Ear: Hearing, tympanic membrane, external ear and ear canal normal. Tympanic membrane is not erythematous, not retracted and not bulging.  Left Ear: Hearing, tympanic membrane, external ear and ear canal normal. Tympanic membrane is not erythematous, not retracted and not bulging.  Nose: No mucosal edema or rhinorrhea. Right sinus exhibits no maxillary sinus tenderness and no frontal sinus tenderness. Left sinus exhibits no maxillary sinus tenderness and no frontal sinus tenderness.  Mouth/Throat: Uvula is midline, oropharynx is clear and moist and mucous membranes are normal.  Eyes: Conjunctivae normal, EOM and lids are normal. Pupils are equal, round, and reactive to light. No foreign bodies found.  Neck: Trachea normal and normal range of motion. Neck supple. Carotid bruit is not present. No mass and no thyromegaly present.  Cardiovascular: Normal rate, regular rhythm, S1 normal, S2 normal, normal heart sounds, intact distal pulses and normal pulses. Exam reveals no gallop and no friction rub.  No murmur heard. No bruit,CEA scarring on neck  Pulmonary/Chest: Effort normal and breath sounds normal. Not tachypneic. No respiratory distress. She has no decreased breath sounds. She has no wheezes. She has no rhonchi. She has no rales.  Abdominal: Soft. Normal appearance and  bowel sounds are normal. There is no tenderness.  Neurological: She is alert.  Skin: Skin is warm, dry and intact. No rash noted.  Psychiatric: Her speech is normal and behavior is normal. Judgment and thought content normal. Her mood appears not anxious. Cognition and memory are normal. She does not exhibit a depressed mood.   Diabetic foot exam:  Normal inspection  No skin breakdown  No calluses  Normal DP pulses  Normal sensation to light touch and monofilament  Nails normal

## 2013-03-17 NOTE — Progress Notes (Signed)
Pre-visit discussion using our clinic review tool. No additional management support is needed unless otherwise documented below in the visit note.  

## 2013-03-29 ENCOUNTER — Telehealth: Payer: Self-pay | Admitting: Family Medicine

## 2013-03-29 NOTE — Telephone Encounter (Signed)
Relevant patient education mailed to patient.  

## 2013-04-01 ENCOUNTER — Telehealth: Payer: Self-pay | Admitting: Family Medicine

## 2013-04-01 NOTE — Telephone Encounter (Signed)
Relevant patient education mailed to patient.  

## 2013-04-30 ENCOUNTER — Other Ambulatory Visit: Payer: Self-pay

## 2013-04-30 MED ORDER — CARVEDILOL 6.25 MG PO TABS: 6.2500 mg | ORAL_TABLET | Freq: Two times a day (BID) | ORAL | Status: DC

## 2013-05-22 ENCOUNTER — Other Ambulatory Visit: Payer: Self-pay | Admitting: *Deleted

## 2013-05-22 MED ORDER — CARVEDILOL 6.25 MG PO TABS: 6.2500 mg | ORAL_TABLET | Freq: Two times a day (BID) | ORAL | Status: DC

## 2013-05-25 ENCOUNTER — Other Ambulatory Visit: Payer: Self-pay | Admitting: *Deleted

## 2013-05-25 MED ORDER — DICLOFENAC SODIUM 1 % TD GEL: 2.0000 g | Freq: Every day | TRANSDERMAL | Status: DC

## 2013-05-25 NOTE — Telephone Encounter (Signed)
Last office visit 01.20.2015.  Ok to refill?

## 2013-07-20 ENCOUNTER — Other Ambulatory Visit: Payer: Self-pay | Admitting: Family Medicine

## 2013-07-23 ENCOUNTER — Encounter: Payer: Self-pay | Admitting: Family

## 2013-07-24 ENCOUNTER — Encounter: Payer: Self-pay | Admitting: Family

## 2013-07-24 ENCOUNTER — Ambulatory Visit (INDEPENDENT_AMBULATORY_CARE_PROVIDER_SITE_OTHER): Payer: 59 | Admitting: Family

## 2013-07-24 ENCOUNTER — Ambulatory Visit (HOSPITAL_COMMUNITY)
Admission: RE | Admit: 2013-07-24 | Discharge: 2013-07-24 | Disposition: A | Discharge status: Home / Self Care | Payer: 59 | Source: Ambulatory Visit | Attending: Family | Admitting: Family

## 2013-07-24 VITALS — BP 151/77 | HR 71 | Resp 16 | Ht 64.5 in | Wt 153.0 lb

## 2013-07-24 DIAGNOSIS — I658 Occlusion and stenosis of other precerebral arteries: Secondary | ICD-10-CM | POA: Insufficient documentation

## 2013-07-24 DIAGNOSIS — I6529 Occlusion and stenosis of unspecified carotid artery: Secondary | ICD-10-CM

## 2013-07-24 DIAGNOSIS — Z48812 Encounter for surgical aftercare following surgery on the circulatory system: Secondary | ICD-10-CM | POA: Insufficient documentation

## 2013-07-24 NOTE — Progress Notes (Signed)
Established Carotid Patient   History of Present Illness  Mallie DartingCynthia C Ishida is a 63 y.o. female patient of Dr. Arbie CookeyEarly status post right CEA on 09/27/11 with subsequent occlusion.  Patient has Negative history of TIA or stroke symptom. The patient denies amaurosis fugax or monocular blindness. The patient denies facial drooping.  Pt. denies hemiplegia. The patient denies receptive or expressive aphasia. Pt. denies extremity weakness.  Patient reports that her blood pressure is usually about 126 systolic, goes up in Dr. offices.  She has a history of bilateral lower legs vein injections for varicosities. She denies claudication symptoms with walking, denies non healing wounds. Her toes ache, she was recently diagnosed with DM neuropathy, she is seeing a chiropractor that is helping with this. Pt denies any history of MI or cardiac problems. Walks almost 8 hours/day as part of her job.  Patient denies New Medical or Surgical History.   Pt Diabetic: Yes, states in good control  Pt smoker: smoker (2-3 cigarettes/day x 40+ yrs)   Pt meds include:  Statin : Yes  Betablocker: Yes  ASA: Yes  Other anticoagulants/antiplatelets: Plavix    Past Medical History  Diagnosis Date  . Diabetes mellitus, type 2   . Hypertension   . Hyperlipidemia   . Sleep apnea     hx had tonsilectomy and no longer has   . Arthritis   . Carotid artery narrowing   . Coronary artery disease     Cardiac catheterization November 2013: 50% ostial LAD stenosis 50% mid stenosis. 30% disease in the left circumflex.    Social History History  Substance Use Topics  . Smoking status: Light Tobacco Smoker -- 0.30 packs/day for 25 years    Types: Cigarettes  . Smokeless tobacco: Never Used     Comment: pt states she smokes about 3-4 cigs per day  . Alcohol Use: 1.2 oz/week    2 Shots of liquor per week     Comment: 2 drinks a day    Family History Family History  Problem Relation Age of Onset  . Hypertension  Sister   . Hypothyroidism Sister   . Cancer Maternal Grandmother     lung    Surgical History Past Surgical History  Procedure Laterality Date  . Varicose vein surgery  2008    stripping  . Gyn surgery  2004    total hysterectomy for mennorhagia,,salpingoophorectomy  . Epidural block injection  02/2008    Drs. Dorthula NettlesBlackwell, Neudelman  . Tonsillectomy    . Endarterectomy  09/27/2011    Procedure: ENDARTERECTOMY CAROTID;  Surgeon: Pryor OchoaJames D Lawson, MD;  [facility];  Service: Vascular;  Laterality: Right;  . Cardiac catheterization    . Carotid endarterectomy Right 09-27-11    cea    Allergies  Allergen Reactions  . Lisinopril-Hydrochlorothiazide Other (See Comments)    angioedema  . Codeine Itching  . Oxycodone   . Tramadol Itching    Current Outpatient Prescriptions  Medication Sig Dispense Refill  . aspirin 81 MG tablet Take 1 tablet (81 mg total) by mouth daily.  30 tablet    . carvedilol (COREG) 6.25 MG tablet Take 1 tablet (6.25 mg total) by mouth 2 (two) times daily with a meal.  60 tablet  3  . clopidogrel (PLAVIX) 75 MG tablet TAKE ONE TABLET BY MOUTH EVERY DAY WITH BREAKFAST  30 tablet  6  . COLCRYS 0.6 MG tablet TAKE ONE TABLET BY MOUTH TWICE DAILY  60 tablet  0  . CRESTOR  20 MG tablet TAKE ONE TABLET BY MOUTH ONCE DAILY  30 tablet  5  . diclofenac sodium (VOLTAREN) 1 % GEL Apply 2-4 g topically daily. Usually on hands  100 g  1  . diphenhydrAMINE (BENADRYL) 25 MG tablet Take 25 mg by mouth every 6 (six) hours as needed. For allergies      . gabapentin (NEURONTIN) 100 MG capsule Take 1 capsule (100 mg total) by mouth at bedtime.  30 capsule  3  . hydrochlorothiazide (HYDRODIURIL) 25 MG tablet TAKE ONE TABLET BY MOUTH EVERY DAY  30 tablet  5  . indomethacin (INDOCIN) 50 MG capsule Take 1 capsule (50 mg total) by mouth 2 (two) times daily with a meal.  60 capsule  3  . metFORMIN (GLUCOPHAGE-XR) 750 MG 24 hr tablet TAKE ONE TABLET BY MOUTH EVERY DAY WITH BREAKFAST  90  tablet  1  . naproxen (NAPROSYN) 500 MG tablet Take 1 tablet (500 mg total) by mouth 2 (two) times daily as needed (pain). Take with food  40 tablet  0   No current facility-administered medications for this visit.    Review of Systems : See HPI for pertinent positives and negatives.  Physical Examination  Filed Vitals:   07/24/13 1036 07/24/13 1039  BP: 154/82 151/77  Pulse: 74 71  Resp:  16  Height:  5' 4.5" (1.638 m)  Weight:  153 lb (69.4 kg)  SpO2:  97%   Body mass index is 25.87 kg/(m^2).  General: WDWN female in NAD  GAIT: normal  Eyes: PERRLA  Pulmonary: CTAB, Negative Rales, Negative rhonchi, & Negative wheezing.  Cardiac: regular Rhythm , Negative Murmurs.   VASCULAR EXAM  Carotid Bruits  Left  Right    Positive  Negative   Radial pulses are 2+ palpable and equal.  LE Pulses  LEFT  RIGHT   POPLITEAL  not palpable  not palpable   POSTERIOR TIBIAL  palpable  palpable   DORSALIS PEDIS  ANTERIOR TIBIAL  palpable  palpable    Gastrointestinal: soft, nontender, BS WNL, no r/g, negative masses.  Musculoskeletal: Negative muscle atrophy/wasting. M/S 5/5 throughout, Extremities without ischemic changes.  Neurologic: A&O X 3; Appropriate Affect ; SENSATION ;normal;  Speech is normal  CN 2-12 intact, Pain and light touch intact in extremities, Motor exam as listed above.   Non-Invasive Vascular Imaging CAROTID DUPLEX 07/24/2013   CEREBROVASCULAR DUPLEX EVALUATION    INDICATION: Carotid artery disease     PREVIOUS INTERVENTION(S): Right carotid endarterectomy 09/27/2011 with subsequent occlusion.    DUPLEX EXAM:     RIGHT  LEFT  Peak Systolic Velocities (cm/s) End Diastolic Velocities (cm/s) Plaque LOCATION Peak Systolic Velocities (cm/s) End Diastolic Velocities (cm/s) Plaque  92 9  CCA PROXIMAL 132 28   160 17 HT CCA MID 113 25 HT  128 11  CCA DISTAL 112 31 HT  175 20  ECA 219 14 HT  Occluded    ICA PROXIMAL 224 62 HT  Occluded    ICA MID 176 46   Occluded    ICA DISTAL 120 42     Not calculated  ICA / CCA Ratio (PSV) 1.98  Antegrade  Vertebral Flow Antegrade   150 Brachial Systolic Pressure (mmHg) 150  Triphasic  Brachial Artery Waveforms Triphasic     Plaque Morphology:  HM = Homogeneous, HT = Heterogeneous, CP = Calcific Plaque, SP = Smooth Plaque, IP = Irregular Plaque     ADDITIONAL FINDINGS:     IMPRESSION: 1. Known  occlusion of the right internal carotid artery. 2. Left internal carotid artery velocities suggest a 40-59% stenosis (high end of range).     Compared to the previous exam:  No significant change in comparison to the last exam on 01/20/2013.       Assessment: 3. April Shaffer is a 63 y.o. female who presents with asymptomatic known occlusion of the right internal carotid artery. Left internal carotid artery velocities suggest a 40-59% stenosis (high end of range).  The  ICA stenosis is  Unchanged from previous exam.  Plan: Follow-up in 6 months with Carotid Duplex scan.  Pt was again counseled re smoking cessation, was given pamphlet re smoking cessation class.  I discussed in depth with the patient the nature of atherosclerosis, and emphasized the importance of maximal medical management including strict control of blood pressure, blood glucose, and lipid levels, obtaining regular exercise, and cessation of smoking.  The patient is aware that without maximal medical management the underlying atherosclerotic disease process will progress, limiting the benefit of any interventions. The patient was given information about stroke prevention and what symptoms should prompt the patient to seek immediate medical care. Thank you for allowing [place] to participate in this patient's care.  Charisse March, RN, MSN, FNP-C [facility] Office: [phone]  Clinic Physician: Imogene Burn  07/24/2013 10:38 AM

## 2013-07-24 NOTE — Patient Instructions (Signed)
Stroke Prevention Some medical conditions and behaviors are associated with an increased chance of having a stroke. You may prevent a stroke by making healthy choices and managing medical conditions. HOW CAN I REDUCE MY RISK OF HAVING A STROKE?   Stay physically active. Get at least 30 minutes of activity on most or all days.  Do not smoke. It may also be helpful to avoid exposure to secondhand smoke.  Limit alcohol use. Moderate alcohol use is considered to be:  No more than 2 drinks per day for men.  No more than 1 drink per day for nonpregnant women.  Eat healthy foods. This involves  Eating 5 or more servings of fruits and vegetables a day.  Following a diet that addresses high blood pressure (hypertension), high cholesterol, diabetes, or obesity.  Manage your cholesterol levels.  A diet low in saturated fat, trans fat, and cholesterol and high in fiber may control cholesterol levels.  Take any prescribed medicines to control cholesterol as directed by your health care provider.  Manage your diabetes.  A controlled-carbohydrate, controlled-sugar diet is recommended to manage diabetes.  Take any prescribed medicines to control diabetes as directed by your health care provider.  Control your hypertension.  A low-salt (sodium), low-saturated fat, low-trans fat, and low-cholesterol diet is recommended to manage hypertension.  Take any prescribed medicines to control hypertension as directed by your health care provider.  Maintain a healthy weight.  A reduced-calorie, low-sodium, low-saturated fat, low-trans fat, low-cholesterol diet is recommended to manage weight.  Stop drug abuse.  Avoid taking birth control pills.  Talk to your health care provider about the risks of taking birth control pills if you are over 35 years old, smoke, get migraines, or have ever had a blood clot.  Get evaluated for sleep disorders (sleep apnea).  Talk to your health care provider about  getting a sleep evaluation if you snore a lot or have excessive sleepiness.  Take medicines as directed by your health care provider.  For some people, aspirin or blood thinners (anticoagulants) are helpful in reducing the risk of forming abnormal blood clots that can lead to stroke. If you have the irregular heart rhythm of atrial fibrillation, you should be on a blood thinner unless there is a good reason you cannot take them.  Understand all your medicine instructions.  Make sure that other other conditions (such as anemia or atherosclerosis) are addressed. SEEK IMMEDIATE MEDICAL CARE IF:   You have sudden weakness or numbness of the face, arm, or leg, especially on one side of the body.  Your face or eyelid droops to one side.  You have sudden confusion.  You have trouble speaking (aphasia) or understanding.  You have sudden trouble seeing in one or both eyes.  You have sudden trouble walking.  You have dizziness.  You have a loss of balance or coordination.  You have a sudden, severe headache with no known cause.  You have new chest pain or an irregular heartbeat. Any of these symptoms may represent a serious problem that is an emergency. Do not wait to see if the symptoms will go away. Get medical help at once. Call your local emergency services  (911 in [place] Do not drive yourself to the hospital. Document Released: 03/22/2004 Document Revised: 12/03/2012 Document Reviewed: 08/15/2012 ExitCare Patient Information 2014 [employer].   Smoking Cessation Quitting smoking is important to your health and has many advantages. However, it is not always easy to quit since nicotine is a   very addictive drug. Often times, people try 3 times or more before being able to quit. This document explains the best ways for you to prepare to quit smoking. Quitting takes hard work and a lot of effort, but you can do it. ADVANTAGES OF QUITTING SMOKING  You will live longer, feel better,  and live better.  Your body will feel the impact of quitting smoking almost immediately.  Within 20 minutes, blood pressure decreases. Your pulse returns to its normal level.  After 8 hours, carbon monoxide levels in the blood return to normal. Your oxygen level increases.  After 24 hours, the chance of having a heart attack starts to decrease. Your breath, hair, and body stop smelling like smoke.  After 48 hours, damaged nerve endings begin to recover. Your sense of taste and smell improve.  After 72 hours, the body is virtually free of nicotine. Your bronchial tubes relax and breathing becomes easier.  After 2 to 12 weeks, lungs can hold more air. Exercise becomes easier and circulation improves.  The risk of having a heart attack, stroke, cancer, or lung disease is greatly reduced.  After 1 year, the risk of coronary heart disease is cut in half.  After 5 years, the risk of stroke falls to the same as a nonsmoker.  After 10 years, the risk of lung cancer is cut in half and the risk of other cancers decreases significantly.  After 15 years, the risk of coronary heart disease drops, usually to the level of a nonsmoker.  If you are pregnant, quitting smoking will improve your chances of having a healthy baby.  The people you live with, especially any children, will be healthier.  You will have extra money to spend on things other than cigarettes. QUESTIONS TO THINK ABOUT BEFORE ATTEMPTING TO QUIT You may want to talk about your answers with your caregiver.  Why do you want to quit?  If you tried to quit in the past, what helped and what did not?  What will be the most difficult situations for you after you quit? How will you plan to handle them?  Who can help you through the tough times? Your family? Friends? A caregiver?  What pleasures do you get from smoking? What ways can you still get pleasure if you quit? Here are some questions to ask your caregiver:  How can you  help me to be successful at quitting?  What medicine do you think would be best for me and how should I take it?  What should I do if I need more help?  What is smoking withdrawal like? How can I get information on withdrawal? GET READY  Set a quit date.  Change your environment by getting rid of all cigarettes, ashtrays, matches, and lighters in your home, car, or work. Do not let people smoke in your home.  Review your past attempts to quit. Think about what worked and what did not. GET SUPPORT AND ENCOURAGEMENT You have a better chance of being successful if you have help. You can get support in many ways.  Tell your family, friends, and co-workers that you are going to quit and need their support. Ask them not to smoke around you.  Get individual, group, or telephone counseling and support. Programs are available at local hospitals and health centers. Call your local health department for information about programs in your area.  Spiritual beliefs and practices may help some smokers quit.  Download a "quit meter" on your computer   to keep track of quit statistics, such as how long you have gone without smoking, cigarettes not smoked, and money saved.  Get a self-help book about quitting smoking and staying off of tobacco. LEARN NEW SKILLS AND BEHAVIORS  Distract yourself from urges to smoke. Talk to someone, go for a walk, or occupy your time with a task.  Change your normal routine. Take a different route to work. Drink tea instead of coffee. Eat breakfast in a different place.  Reduce your stress. Take a hot bath, exercise, or read a book.  Plan something enjoyable to do every day. Reward yourself for not smoking.  Explore interactive web-based programs that specialize in helping you quit. GET MEDICINE AND USE IT CORRECTLY Medicines can help you stop smoking and decrease the urge to smoke. Combining medicine with the above behavioral methods and support can greatly increase  your chances of successfully quitting smoking.  Nicotine replacement therapy helps deliver nicotine to your body without the negative effects and risks of smoking. Nicotine replacement therapy includes nicotine gum, lozenges, inhalers, nasal sprays, and skin patches. Some may be available over-the-counter and others require a prescription.  Antidepressant medicine helps people abstain from smoking, but how this works is unknown. This medicine is available by prescription.  Nicotinic receptor partial agonist medicine simulates the effect of nicotine in your brain. This medicine is available by prescription. Ask your caregiver for advice about which medicines to use and how to use them based on your health history. Your caregiver will tell you what side effects to look out for if you choose to be on a medicine or therapy. Carefully read the information on the package. Do not use any other product containing nicotine while using a nicotine replacement product.  RELAPSE OR DIFFICULT SITUATIONS Most relapses occur within the first 3 months after quitting. Do not be discouraged if you start smoking again. Remember, most people try several times before finally quitting. You may have symptoms of withdrawal because your body is used to nicotine. You may crave cigarettes, be irritable, feel very hungry, cough often, get headaches, or have difficulty concentrating. The withdrawal symptoms are only temporary. They are strongest when you first quit, but they will go away within 10 14 days. To reduce the chances of relapse, try to:  Avoid drinking alcohol. Drinking lowers your chances of successfully quitting.  Reduce the amount of caffeine you consume. Once you quit smoking, the amount of caffeine in your body increases and can give you symptoms, such as a rapid heartbeat, sweating, and anxiety.  Avoid smokers because they can make you want to smoke.  Do not let weight gain distract you. Many smokers will gain  weight when they quit, usually less than 10 pounds. Eat a healthy diet and stay active. You can always lose the weight gained after you quit.  Find ways to improve your mood other than smoking. FOR MORE INFORMATION  www.smokefree.gov  Document Released: 02/06/2001 Document Revised: 08/14/2011 Document Reviewed: 05/24/2011 ExitCare Patient Information 2014 [employer].  

## 2013-07-24 NOTE — Addendum Note (Signed)
Addended by: Sharee Pimple on: 07/24/2013 03:04 PM   Modules accepted: Orders

## 2013-08-10 ENCOUNTER — Other Ambulatory Visit: Payer: Self-pay | Admitting: *Deleted

## 2013-08-10 MED ORDER — CLOPIDOGREL BISULFATE 75 MG PO TABS: ORAL_TABLET | ORAL | Status: DC

## 2013-09-02 ENCOUNTER — Other Ambulatory Visit: Payer: Self-pay | Admitting: Family Medicine

## 2013-09-03 ENCOUNTER — Other Ambulatory Visit: Payer: Self-pay | Admitting: *Deleted

## 2013-09-03 MED ORDER — METFORMIN HCL ER 750 MG PO TB24: ORAL_TABLET | ORAL | Status: DC

## 2013-09-08 ENCOUNTER — Other Ambulatory Visit (INDEPENDENT_AMBULATORY_CARE_PROVIDER_SITE_OTHER): Payer: 59

## 2013-09-08 ENCOUNTER — Telehealth: Payer: Self-pay | Admitting: Family Medicine

## 2013-09-08 DIAGNOSIS — M109 Gout, unspecified: Secondary | ICD-10-CM

## 2013-09-08 DIAGNOSIS — E1149 Type 2 diabetes mellitus with other diabetic neurological complication: Secondary | ICD-10-CM

## 2013-09-08 DIAGNOSIS — E785 Hyperlipidemia, unspecified: Secondary | ICD-10-CM

## 2013-09-08 LAB — COMPREHENSIVE METABOLIC PANEL
ALBUMIN: 3.7 g/dL (ref 3.5–5.2)
ALT: 12 U/L (ref 0–35)
AST: 21 U/L (ref 0–37)
Alkaline Phosphatase: 65 U/L (ref 39–117)
BUN: 21 mg/dL (ref 6–23)
CO2: 21 mEq/L (ref 19–32)
Calcium: 8.8 mg/dL (ref 8.4–10.5)
Chloride: 105 mEq/L (ref 96–112)
Creatinine, Ser: 1 mg/dL (ref 0.4–1.2)
GFR: 75.02 mL/min (ref >60.00)
GLUCOSE: 96 mg/dL (ref 70–99)
POTASSIUM: 3.7 meq/L (ref 3.5–5.1)
SODIUM: 137 meq/L (ref 135–145)
TOTAL PROTEIN: 7.5 g/dL (ref 6.0–8.3)
Total Bilirubin: 0.9 mg/dL (ref 0.2–1.2)

## 2013-09-08 LAB — LIPID PANEL
CHOLESTEROL: 174 mg/dL (ref 0–200)
HDL: 59.5 mg/dL (ref >39.00)
LDL Cholesterol: 76 mg/dL (ref 0–99)
NonHDL: 114.5
TRIGLYCERIDES: 195 mg/dL — AB (ref 0.0–149.0)
Total CHOL/HDL Ratio: 3
VLDL: 39 mg/dL (ref 0.0–40.0)

## 2013-09-08 LAB — URIC ACID: Uric Acid, Serum: 9.6 mg/dL — ABNORMAL HIGH (ref 2.4–7.0)

## 2013-09-08 LAB — HEMOGLOBIN A1C: Hgb A1c MFr Bld: 6.5 % (ref 4.6–6.5)

## 2013-09-08 NOTE — Telephone Encounter (Signed)
Message copied by Excell SeltzerBEDSOLE, Radley Teston E on Tue Sep 08, 2013  8:02 AM ------      Message from: Alvina ChouWALSH, TERRI J      Created: Fri Sep 04, 2013 12:53 PM      Regarding: Lab orders for Tuesday, 7.14.15       Lab orders for a 6 month f/u ------

## 2013-09-15 ENCOUNTER — Telehealth: Payer: Self-pay

## 2013-09-15 ENCOUNTER — Encounter: Payer: Self-pay | Admitting: Family Medicine

## 2013-09-15 ENCOUNTER — Ambulatory Visit (INDEPENDENT_AMBULATORY_CARE_PROVIDER_SITE_OTHER): Payer: 59 | Admitting: Family Medicine

## 2013-09-15 VITALS — BP 142/82 | HR 74 | Temp 98.4°F | Ht 64.0 in | Wt 151.5 lb

## 2013-09-15 DIAGNOSIS — E1149 Type 2 diabetes mellitus with other diabetic neurological complication: Secondary | ICD-10-CM

## 2013-09-15 DIAGNOSIS — E785 Hyperlipidemia, unspecified: Secondary | ICD-10-CM

## 2013-09-15 DIAGNOSIS — E1142 Type 2 diabetes mellitus with diabetic polyneuropathy: Secondary | ICD-10-CM

## 2013-09-15 DIAGNOSIS — I10 Essential (primary) hypertension: Secondary | ICD-10-CM

## 2013-09-15 LAB — HM DIABETES FOOT EXAM

## 2013-09-15 MED ORDER — GABAPENTIN 100 MG PO CAPS: ORAL_CAPSULE | ORAL | Status: DC

## 2013-09-15 NOTE — Assessment & Plan Note (Signed)
Increase gabapentin to 300 mg BID slowly.

## 2013-09-15 NOTE — Assessment & Plan Note (Signed)
Well controlled at home. Encouraged exercise, weight loss, healthy eating habits.

## 2013-09-15 NOTE — Progress Notes (Signed)
Diabetes: Great control on glucophage alone.  Lab Results  Component Value Date   HGBA1C 6.5 09/08/2013  Using medications without difficulties: None  Hypoglycemic episodes:none  Hyperglycemic episodes:none  Feet problems: no ulcers, she does have neuropathy Blood Sugars averaging: not checking lately  Wt Readings from Last 3 Encounters:  09/15/13 151 lb 8 oz (68.72 kg)  07/24/13 153 lb (69.4 kg)  03/17/13 153 lb 4 oz (69.514 kg)  Eye exam: 06/2012  Hypertension:Well controlled at home on HCTZ , had angioedema with lisinopril. Now on coreg.  She did not take med today. BP Readings from Last 3 Encounters:  09/15/13 142/82  07/24/13 151/77  03/17/13 118/64  Using medication without problems or lightheadedness: None  Chest pain with exertion:None  Edema:None  Short of breath:None  Average home BPs: 120/70   Elevated Cholesterol: Almost at goal on crestor 20 mg. Goal <70 given carotid stenosis  Lab Results  Component Value Date   CHOL 174 09/08/2013   HDL 59.50 09/08/2013   LDLCALC 76 09/08/2013   LDLDIRECT 139.7 03/28/2012   TRIG 195.0* 09/08/2013   CHOLHDL 3 09/08/2013  Diet: Moderate  Exercise: Occ  Other complaints:  No muscle pain. She is working on quitting smoking. 2-3 cigs a day now.  Chronic right sciatica.  She has been having low back pain, radiates down right buttock to upper thigh, occ radiates to foot. No weakness. NO Groin numbness, no fever. No incontinence.  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 Helped for a while.Marland Kitchen. 1-2 month but would always come back.  She saw Dr. Magnus IvanBlackman in past.  No surgical indication at that time.  Ibuprofen 800mg  helps some, takes occ.  Naproxen does not help much  Toes feel achy/ burning bilaterally. When lies down at night sheets feel weird on feet.   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 Helped a little, but not much.  She would like FMLA filled  out for intermittant leave as needed for DM, foot issues, sciatic and OA in hands. She has flares of these that keep her from work. Her job is housekeeping. 1 day at a time, unable to work patrial work during those times.     Review of Systems  Constitutional: Negative for fever and fatigue.  HENT: Negative for ear pain.  Eyes: Negative for pain.  Respiratory: Negative for chest tightness and shortness of breath.  Cardiovascular: Negative for chest pain, palpitations and leg swelling.  Gastrointestinal: Negative for abdominal pain.  Genitourinary: Negative for dysuria.  Objective:   Physical Exam  Constitutional: Vital signs are normal. She appears well-developed and well-nourished. She is cooperative. Non-toxic appearance. She does not appear ill. No distress.  HENT:  Head: Normocephalic.  Right Ear: Hearing, tympanic membrane, external ear and ear canal normal. Tympanic membrane is not erythematous, not retracted and not bulging.  Left Ear: Hearing, tympanic membrane, external ear and ear canal normal. Tympanic membrane is not erythematous, not retracted and not bulging.  Nose: No mucosal edema or rhinorrhea. Right sinus exhibits no maxillary sinus tenderness and no frontal sinus tenderness. Left sinus exhibits no maxillary sinus tenderness and no frontal sinus tenderness.  Mouth/Throat: Uvula is midline, oropharynx is clear and moist and mucous membranes are normal.  Eyes: Conjunctivae normal, EOM and lids are normal. Pupils are equal, round, and reactive to light. No foreign bodies found.  Neck: Trachea normal and normal range of motion. Neck supple.  Carotid bruit is not present. No mass and no thyromegaly present.  Cardiovascular: Normal rate, regular rhythm, S1 normal, S2 normal, normal heart sounds, intact distal pulses and normal pulses. Exam reveals no gallop and no friction rub.  No murmur heard. No bruit,CEA scarring on neck  Pulmonary/Chest: Effort normal and breath sounds  normal. Not tachypneic. No respiratory distress. She has no decreased breath sounds. She has no wheezes. She has no rhonchi. She has no rales.  Abdominal: Soft. Normal appearance and bowel sounds are normal. There is no tenderness.  Neurological: She is alert.  Skin: Skin is warm, dry and intact. No rash noted.  Psychiatric: Her speech is normal and behavior is normal. Judgment and thought content normal. Her mood appears not anxious. Cognition and memory are normal. She does not exhibit a depressed mood.   Diabetic foot exam:  Normal inspection  No skin breakdown  No calluses  Normal DP pulses  Normal sensation to light touch and monofilament  Nails normal

## 2013-09-15 NOTE — Assessment & Plan Note (Signed)
Well controlled. Continue current medication.  

## 2013-09-15 NOTE — Progress Notes (Signed)
Pre visit review using our clinic review tool, if applicable. No additional management support is needed unless otherwise documented below in the visit note. 

## 2013-09-15 NOTE — Assessment & Plan Note (Signed)
LDL almost at goal < 70 on crestor.

## 2013-09-15 NOTE — Patient Instructions (Addendum)
Set up yearly eye exam. Trial of gabapentin during the day and at night. Call if not improving symptoms. Work on exercise and lowering fat in diet. Can try compression hose to help with leg pain. Schedule CPX with labs prior in 3 months.  Diabetes and Exercise Exercising regularly is important. It is not just about losing weight. It has many health benefits, such as:  Improving your overall fitness, flexibility, and endurance.  Increasing your bone density.  Helping with weight control.  Decreasing your body fat.  Increasing your muscle strength.  Reducing stress and tension.  Improving your overall health. People with diabetes who exercise gain additional benefits because exercise:  Reduces appetite.  Improves the body's use of blood sugar (glucose).  Helps lower or control blood glucose.  Decreases blood pressure.  Helps control blood lipids (such as cholesterol and triglycerides).  Improves the body's use of the hormone insulin by:  Increasing the body's insulin sensitivity.  Reducing the body's insulin needs.  Decreases the risk for heart disease because exercising:  Lowers cholesterol and triglycerides levels.  Increases the levels of good cholesterol (such as high-density lipoproteins [HDL]) in the body.  Lowers blood glucose levels. YOUR ACTIVITY PLAN  Choose an activity that you enjoy and set realistic goals. Your health care provider or diabetes educator can help you make an activity plan that works for you. You can break activities into 2 or 3 sessions throughout the day. Doing so is as good as one long session. Exercise ideas include:  Taking the dog for a walk.  Taking the stairs instead of the elevator.  Dancing to your favorite song.  Doing your favorite exercise with a friend. RECOMMENDATIONS FOR EXERCISING WITH TYPE 1 OR TYPE 2 DIABETES   Check your blood glucose before exercising. If blood glucose levels are greater than 240 mg/dL, check for  urine ketones. Do not exercise if ketones are present.  Avoid injecting insulin into areas of the body that are going to be exercised. For example, avoid injecting insulin into:  The arms when playing tennis.  The legs when jogging.  Keep a record of:  Food intake before and after you exercise.  Expected peak times of insulin action.  Blood glucose levels before and after you exercise.  The type and amount of exercise you have done.  Review your records with your health care provider. Your health care provider will help you to develop guidelines for adjusting food intake and insulin amounts before and after exercising.  If you take insulin or oral hypoglycemic agents, watch for signs and symptoms of hypoglycemia. They include:  Dizziness.  Shaking.  Sweating.  Chills.  Confusion.  Drink plenty of water while you exercise to prevent dehydration or heat stroke. Body water is lost during exercise and must be replaced.  Talk to your health care provider before starting an exercise program to make sure it is safe for you. Remember, almost any type of activity is better than none. Document Released: 05/05/2003 Document Revised: 10/15/2012 Document Reviewed: 07/22/2012 [facility] Patient Information 2015 [place], [place]. This information is not intended to replace advice given to you by your health care provider. Make sure you discuss any questions you have with your health care provider.

## 2013-09-15 NOTE — Telephone Encounter (Signed)
Pt left v/m; [facility] at [facility] left v/[facility]; received electronic submission for Gabapentin with conflicting instructions on sig and in notes. Heather request cb clarifying instructions.

## 2013-09-15 NOTE — Telephone Encounter (Signed)
[facility]

## 2013-09-15 NOTE — Telephone Encounter (Signed)
Correction. She is to gradually increase to 3 tabs of 100 mg twice day. Please verify.

## 2013-09-29 ENCOUNTER — Other Ambulatory Visit: Payer: Self-pay | Admitting: Family Medicine

## 2013-09-29 NOTE — Telephone Encounter (Signed)
Last office visit 09/15/2013.  Not on current medication list.  Ok to refill?

## 2013-12-04 ENCOUNTER — Other Ambulatory Visit: Payer: Self-pay | Admitting: Family Medicine

## 2013-12-04 DIAGNOSIS — Z1231 Encounter for screening mammogram for malignant neoplasm of breast: Secondary | ICD-10-CM

## 2013-12-14 ENCOUNTER — Telehealth: Payer: Self-pay | Admitting: Family Medicine

## 2013-12-14 DIAGNOSIS — E1165 Type 2 diabetes mellitus with hyperglycemia: Principal | ICD-10-CM

## 2013-12-14 DIAGNOSIS — IMO0002 Reserved for concepts with insufficient information to code with codable children: Secondary | ICD-10-CM

## 2013-12-14 DIAGNOSIS — E1140 Type 2 diabetes mellitus with diabetic neuropathy, unspecified: Secondary | ICD-10-CM

## 2013-12-14 DIAGNOSIS — M109 Gout, unspecified: Secondary | ICD-10-CM

## 2013-12-14 DIAGNOSIS — E785 Hyperlipidemia, unspecified: Secondary | ICD-10-CM

## 2013-12-14 NOTE — Telephone Encounter (Signed)
Message copied by Excell SeltzerBEDSOLE, AMY E on Mon Dec 14, 2013 11:21 PM ------      Message from: Alvina ChouWALSH, April Shaffer J      Created: Fri Dec 11, 2013  3:35 PM      Regarding: Lab orders for Tuesday, 10.20.15       Patient is scheduled for CPX labs, please order future labs, Thanks , April Shaffer       ------

## 2013-12-15 ENCOUNTER — Other Ambulatory Visit (INDEPENDENT_AMBULATORY_CARE_PROVIDER_SITE_OTHER): Payer: 59

## 2013-12-15 DIAGNOSIS — M1000 Idiopathic gout, unspecified site: Secondary | ICD-10-CM

## 2013-12-15 DIAGNOSIS — E1140 Type 2 diabetes mellitus with diabetic neuropathy, unspecified: Secondary | ICD-10-CM

## 2013-12-15 DIAGNOSIS — E785 Hyperlipidemia, unspecified: Secondary | ICD-10-CM

## 2013-12-15 DIAGNOSIS — M109 Gout, unspecified: Secondary | ICD-10-CM

## 2013-12-15 DIAGNOSIS — E1165 Type 2 diabetes mellitus with hyperglycemia: Secondary | ICD-10-CM

## 2013-12-15 DIAGNOSIS — IMO0002 Reserved for concepts with insufficient information to code with codable children: Secondary | ICD-10-CM

## 2013-12-15 DIAGNOSIS — I10 Essential (primary) hypertension: Secondary | ICD-10-CM

## 2013-12-15 LAB — LIPID PANEL
CHOLESTEROL: 144 mg/dL (ref 0–200)
HDL: 41.7 mg/dL (ref >39.00)
LDL CALC: 78 mg/dL (ref 0–99)
NonHDL: 102.3
TRIGLYCERIDES: 124 mg/dL (ref 0.0–149.0)
Total CHOL/HDL Ratio: 3
VLDL: 24.8 mg/dL (ref 0.0–40.0)

## 2013-12-15 LAB — COMPREHENSIVE METABOLIC PANEL
ALBUMIN: 3.4 g/dL — AB (ref 3.5–5.2)
ALK PHOS: 72 U/L (ref 39–117)
ALT: 14 U/L (ref 0–35)
AST: 26 U/L (ref 0–37)
BUN: 27 mg/dL — AB (ref 6–23)
CALCIUM: 9.1 mg/dL (ref 8.4–10.5)
CHLORIDE: 102 meq/L (ref 96–112)
CO2: 28 mEq/L (ref 19–32)
Creatinine, Ser: 1 mg/dL (ref 0.4–1.2)
GFR: 69.16 mL/min (ref >60.00)
Glucose, Bld: 105 mg/dL — ABNORMAL HIGH (ref 70–99)
Potassium: 3.7 mEq/L (ref 3.5–5.1)
Sodium: 138 mEq/L (ref 135–145)
Total Bilirubin: 0.6 mg/dL (ref 0.2–1.2)
Total Protein: 8.2 g/dL (ref 6.0–8.3)

## 2013-12-15 LAB — URIC ACID: Uric Acid, Serum: 9.4 mg/dL — ABNORMAL HIGH (ref 2.4–7.0)

## 2013-12-15 LAB — HEMOGLOBIN A1C: Hgb A1c MFr Bld: 6.4 % (ref 4.6–6.5)

## 2013-12-21 ENCOUNTER — Ambulatory Visit (HOSPITAL_COMMUNITY)
Admission: RE | Admit: 2013-12-21 | Discharge: 2013-12-21 | Disposition: A | Discharge status: Home / Self Care | Payer: 59 | Source: Ambulatory Visit | Attending: Family Medicine | Admitting: Family Medicine

## 2013-12-21 DIAGNOSIS — Z1231 Encounter for screening mammogram for malignant neoplasm of breast: Secondary | ICD-10-CM | POA: Insufficient documentation

## 2013-12-22 ENCOUNTER — Encounter: Payer: Self-pay | Admitting: Family Medicine

## 2013-12-22 ENCOUNTER — Ambulatory Visit (INDEPENDENT_AMBULATORY_CARE_PROVIDER_SITE_OTHER): Payer: 59 | Admitting: Family Medicine

## 2013-12-22 VITALS — BP 140/76 | HR 76 | Temp 97.9°F | Ht 64.0 in | Wt 150.0 lb

## 2013-12-22 DIAGNOSIS — E348 Other specified endocrine disorders: Secondary | ICD-10-CM

## 2013-12-22 DIAGNOSIS — E785 Hyperlipidemia, unspecified: Secondary | ICD-10-CM

## 2013-12-22 DIAGNOSIS — E1140 Type 2 diabetes mellitus with diabetic neuropathy, unspecified: Secondary | ICD-10-CM

## 2013-12-22 DIAGNOSIS — E1165 Type 2 diabetes mellitus with hyperglycemia: Secondary | ICD-10-CM

## 2013-12-22 DIAGNOSIS — IMO0002 Reserved for concepts with insufficient information to code with codable children: Secondary | ICD-10-CM

## 2013-12-22 DIAGNOSIS — M1A09X0 Idiopathic chronic gout, multiple sites, without tophus (tophi): Secondary | ICD-10-CM

## 2013-12-22 LAB — HM DIABETES FOOT EXAM

## 2013-12-22 MED ORDER — ALLOPURINOL 100 MG PO TABS: 100.0000 mg | ORAL_TABLET | Freq: Every day | ORAL | Status: DC

## 2013-12-22 NOTE — Assessment & Plan Note (Signed)
No current flare.  low uric acid diet.  Start low dose allopurinol and return for recheck uric acid.

## 2013-12-22 NOTE — Progress Notes (Signed)
63 year old female presents for wellness visit.   Diabetes: Great control on glucophage alone.  Lab Results  Component Value Date   HGBA1C 6.4 12/15/2013  Using medications without difficulties: None  Hypoglycemic episodes:none  Hyperglycemic episodes:none  Feet problems: no ulcers, she does have neuropathy  (higher dose of gabapentin has not helped.) Blood Sugars averaging: postprandial occ 105 Eye exam: 06/2012, due now Wt Readings from Last 3 Encounters:  12/22/13 150 lb (68.04 kg)  09/15/13 151 lb 8 oz (68.72 kg)  07/24/13 153 lb (69.4 kg)     Hypertension:Well controlled at home on HCTZ , had angioedema with lisinopril. Now on coreg. She did not take med today. BP Readings from Last 3 Encounters:  12/22/13 140/76  09/15/13 142/82  07/24/13 151/77  Using medication without problems or lightheadedness: None  Chest pain with exertion:None  Edema:None  Short of breath:None  Average home BPs: 120/70   Elevated Cholesterol: Almost at goal on crestor 20 mg. Goal <70 given carotid stenosis  Lab Results  Component Value Date   CHOL 144 12/15/2013   HDL 41.70 12/15/2013   LDLCALC 78 12/15/2013   LDLDIRECT 139.7 03/28/2012   TRIG 124.0 12/15/2013   CHOLHDL 3 12/15/2013  Diet: Moderate  Exercise: Occ  Other complaints: No muscle pain.   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  Helped a little, but not much.    Gout : 1 flare in last year.   Uric acid is at 9.4. No current flare. She has ate some shrimp lately.   Review of Systems  Constitutional: Negative for fever and fatigue.  HENT: Negative for ear pain.  Eyes: Negative for pain.  Respiratory: Negative for chest tightness and shortness of breath.  Cardiovascular: Negative for chest pain, palpitations and leg swelling.  Gastrointestinal: Negative for abdominal pain.  Genitourinary: Negative for dysuria.  Objective:   Physical Exam  Constitutional: Vital  signs are normal. She appears well-developed and well-nourished. She is cooperative. Non-toxic appearance. She does not appear ill. No distress.  HENT:  Head: Normocephalic.  Right Ear: Hearing, tympanic membrane, external ear and ear canal normal. Tympanic membrane is not erythematous, not retracted and not bulging.  Left Ear: Hearing, tympanic membrane, external ear and ear canal normal. Tympanic membrane is not erythematous, not retracted and not bulging.  Nose: No mucosal edema or rhinorrhea. Right sinus exhibits no maxillary sinus tenderness and no frontal sinus tenderness. Left sinus exhibits no maxillary sinus tenderness and no frontal sinus tenderness.  Mouth/Throat: Uvula is midline, oropharynx is clear and moist and mucous membranes are normal.  Eyes: Conjunctivae normal, EOM and lids are normal. Pupils are equal, round, and reactive to light. No foreign bodies found.  Neck: Trachea normal and normal range of motion. Neck supple. Carotid bruit is not present. No mass and no thyromegaly present.  Cardiovascular: Normal rate, regular rhythm, S1 normal, S2 normal, normal heart sounds, intact distal pulses and normal pulses. Exam reveals no gallop and no friction rub.  No murmur heard. No bruit,CEA scarring on neck  Pulmonary/Chest: Effort normal and breath sounds normal. Not tachypneic. No respiratory distress. She has no decreased breath sounds. She has no wheezes. She has no rhonchi. She has no rales.  Abdominal: Soft. Normal appearance and bowel sounds are normal. There is no tenderness.  Neurological: She is alert.  Skin: Skin is warm, dry and intact. No rash noted.  Psychiatric: Her speech is normal and behavior  is normal. Judgment and thought content normal. Her mood appears not anxious. Cognition and memory are normal. She does not exhibit a depressed mood.  Breast exam: no massess. No DVE/PAP indicated.  Diabetic foot exam:  Normal inspection  No skin breakdown  No calluses   Normal DP pulses  Normal sensation to light touch and monofilament  Nails normal    ASSESSMENT and PLAN: The patient's preventative maintenance and recommended screening tests for an annual wellness exam were reviewed in full today. Brought up to date unless services declined.  Counselled on the importance of diet, exercise, and its role in overall health and mortality. The patient's FH and SH was reviewed, including their home life, tobacco status, and drug and alcohol status.   Vaccines: Given flu at work, Due for prevnar and shingles.  Will return for these. DVE/pap:  Hysterectomy total, for menorrhagia  Colon: 2009 hyperplastic polyp, repeat due in 2019.  DEXA: Due.  Mammo: Had done yesterday.  former smoker;  > 25 pack year. No SOB or daily cough. Not interested in spirometry.

## 2013-12-22 NOTE — Progress Notes (Signed)
Pre visit review using our clinic review tool, if applicable. No additional management support is needed unless otherwise documented below in the visit note. 

## 2013-12-22 NOTE — Assessment & Plan Note (Signed)
Almost at goal < 70 on crestor. .Encouraged exercise, weight loss, healthy eating habits.

## 2013-12-22 NOTE — Patient Instructions (Addendum)
You can return in 30 days if you are interested in shingles and prevnar vaccines. Stop at front desk to set up bone density. Set up yearly eye exam Low uric acid diet. Start low dose allopurinol. Schedule lab only visit in 4-6 weeks after starting. Follow up in 6 months with labs prior.  Gout Gout is an inflammatory arthritis caused by a buildup of uric acid crystals in the joints. Uric acid is a chemical that is normally present in the blood. When the level of uric acid in the blood is too high it can form crystals that deposit in your joints and tissues. This causes joint redness, soreness, and swelling (inflammation). Repeat attacks are common. Over time, uric acid crystals can form into masses (tophi) near a joint, destroying bone and causing disfigurement. Gout is treatable and often preventable. CAUSES  The disease begins with elevated levels of uric acid in the blood. Uric acid is produced by your body when it breaks down a naturally found substance called purines. Certain foods you eat, such as meats and fish, contain high amounts of purines. Causes of an elevated uric acid level include:  Being passed down from parent to child (heredity).  Diseases that cause increased uric acid production (such as obesity, psoriasis, and certain cancers).  Excessive alcohol use.  Diet, especially diets rich in meat and seafood.  Medicines, including certain cancer-fighting medicines (chemotherapy), water pills (diuretics), and aspirin.  Chronic kidney disease. The kidneys are no longer able to remove uric acid well.  Problems with metabolism. Conditions strongly associated with gout include:  Obesity.  High blood pressure.  High cholesterol.  Diabetes. Not everyone with elevated uric acid levels gets gout. It is not understood why some people get gout and others do not. Surgery, joint injury, and eating too much of certain foods are some of the factors that can lead to gout  attacks. SYMPTOMS   An attack of gout comes on quickly. It causes intense pain with redness, swelling, and warmth in a joint.  Fever can occur.  Often, only one joint is involved. Certain joints are more commonly involved:  Base of the big toe.  Knee.  Ankle.  Wrist.  Finger. Without treatment, an attack usually goes away in a few days to weeks. Between attacks, you usually will not have symptoms, which is different from many other forms of arthritis. DIAGNOSIS  Your caregiver will suspect gout based on your symptoms and exam. In some cases, tests may be recommended. The tests may include:  Blood tests.  Urine tests.  X-rays.  Joint fluid exam. This exam requires a needle to remove fluid from the joint (arthrocentesis). Using a microscope, gout is confirmed when uric acid crystals are seen in the joint fluid. TREATMENT  There are two phases to gout treatment: treating the sudden onset (acute) attack and preventing attacks (prophylaxis).  Treatment of an Acute Attack.  Medicines are used. These include anti-inflammatory medicines or steroid medicines.  An injection of steroid medicine into the affected joint is sometimes necessary.  The painful joint is rested. Movement can worsen the arthritis.  You may use warm or cold treatments on painful joints, depending which works best for you.  Treatment to Prevent Attacks.  If you suffer from frequent gout attacks, your caregiver may advise preventive medicine. These medicines are started after the acute attack subsides. These medicines either help your kidneys eliminate uric acid from your body or decrease your uric acid production. You may need to stay  on these medicines for a very long time.  The early phase of treatment with preventive medicine can be associated with an increase in acute gout attacks. For this reason, during the first few months of treatment, your caregiver may also advise you to take medicines usually used  for acute gout treatment. Be sure you understand your caregiver's directions. Your caregiver may make several adjustments to your medicine dose before these medicines are effective.  Discuss dietary treatment with your caregiver or dietitian. Alcohol and drinks high in sugar and fructose and foods such as meat, poultry, and seafood can increase uric acid levels. Your caregiver or dietitian can advise you on drinks and foods that should be limited. HOME CARE INSTRUCTIONS   Do not take aspirin to relieve pain. This raises uric acid levels.  Only take over-the-counter or prescription medicines for pain, discomfort, or fever as directed by your caregiver.  Rest the joint as much as possible. When in bed, keep sheets and blankets off painful areas.  Keep the affected joint raised (elevated).  Apply warm or cold treatments to painful joints. Use of warm or cold treatments depends on which works best for you.  Use crutches if the painful joint is in your leg.  Drink enough fluids to keep your urine clear or pale yellow. This helps your body get rid of uric acid. Limit alcohol, sugary drinks, and fructose drinks.  Follow your dietary instructions. Pay careful attention to the amount of protein you eat. Your daily diet should emphasize fruits, vegetables, whole grains, and fat-free or low-fat milk products. Discuss the use of coffee, vitamin C, and cherries with your caregiver or dietitian. These may be helpful in lowering uric acid levels.  Maintain a healthy body weight. SEEK MEDICAL CARE IF:   You develop diarrhea, vomiting, or any side effects from medicines.  You do not feel better in 24 hours, or you are getting worse. SEEK IMMEDIATE MEDICAL CARE IF:   Your joint becomes suddenly more tender, and you have chills or a fever. MAKE SURE YOU:   Understand these instructions.  Will watch your condition.  Will get help right away if you are not doing well or get worse. Document Released:  02/10/2000 Document Revised: 06/29/2013 Document Reviewed: 09/26/2011 [facility] Patient Information 2015 [place], [place]. This information is not intended to replace advice given to you by your health care provider. Make sure you discuss any questions you have with your health care provider.

## 2013-12-22 NOTE — Assessment & Plan Note (Signed)
Well controlled. Continue current medication.  

## 2013-12-25 ENCOUNTER — Other Ambulatory Visit: Payer: Self-pay | Admitting: Family Medicine

## 2013-12-25 NOTE — Telephone Encounter (Signed)
Last office visit 12/22/2013.  Last refilled 05/25/2013 for 100 gram with 1 refill.  Ok to refill?

## 2013-12-28 ENCOUNTER — Ambulatory Visit (HOSPITAL_COMMUNITY)
Admission: RE | Admit: 2013-12-28 | Discharge: 2013-12-28 | Disposition: A | Discharge status: Home / Self Care | Payer: 59 | Source: Ambulatory Visit | Attending: Family Medicine | Admitting: Family Medicine

## 2013-12-28 DIAGNOSIS — Z13820 Encounter for screening for osteoporosis: Secondary | ICD-10-CM | POA: Diagnosis not present

## 2013-12-28 DIAGNOSIS — E348 Other specified endocrine disorders: Secondary | ICD-10-CM

## 2013-12-28 DIAGNOSIS — Z780 Asymptomatic menopausal state: Secondary | ICD-10-CM | POA: Diagnosis not present

## 2014-01-05 ENCOUNTER — Encounter: Payer: Self-pay | Admitting: Family Medicine

## 2014-01-22 ENCOUNTER — Other Ambulatory Visit (INDEPENDENT_AMBULATORY_CARE_PROVIDER_SITE_OTHER): Payer: 59

## 2014-01-22 DIAGNOSIS — M1A09X0 Idiopathic chronic gout, multiple sites, without tophus (tophi): Secondary | ICD-10-CM

## 2014-01-22 LAB — URIC ACID: URIC ACID, SERUM: 6.2 mg/dL (ref 2.4–7.0)

## 2014-02-05 ENCOUNTER — Ambulatory Visit: Payer: 59 | Admitting: Family

## 2014-02-05 ENCOUNTER — Other Ambulatory Visit (HOSPITAL_COMMUNITY): Payer: 59

## 2014-03-11 ENCOUNTER — Encounter: Payer: Self-pay | Admitting: Family

## 2014-03-12 ENCOUNTER — Other Ambulatory Visit: Payer: Self-pay | Admitting: Family Medicine

## 2014-03-12 ENCOUNTER — Ambulatory Visit (INDEPENDENT_AMBULATORY_CARE_PROVIDER_SITE_OTHER): Payer: 59 | Admitting: Family

## 2014-03-12 ENCOUNTER — Ambulatory Visit: Payer: 59 | Admitting: Family

## 2014-03-12 ENCOUNTER — Other Ambulatory Visit (HOSPITAL_COMMUNITY): Payer: 59

## 2014-03-12 ENCOUNTER — Encounter: Payer: Self-pay | Admitting: Family

## 2014-03-12 ENCOUNTER — Ambulatory Visit (HOSPITAL_COMMUNITY)
Admission: RE | Admit: 2014-03-12 | Discharge: 2014-03-12 | Disposition: A | Discharge status: Home / Self Care | Payer: 59 | Source: Ambulatory Visit | Attending: Family | Admitting: Family

## 2014-03-12 VITALS — BP 131/79 | HR 79 | Resp 16 | Ht 64.5 in | Wt 147.0 lb

## 2014-03-12 DIAGNOSIS — Z48812 Encounter for surgical aftercare following surgery on the circulatory system: Secondary | ICD-10-CM

## 2014-03-12 DIAGNOSIS — Z9889 Other specified postprocedural states: Secondary | ICD-10-CM | POA: Insufficient documentation

## 2014-03-12 DIAGNOSIS — Z720 Tobacco use: Secondary | ICD-10-CM | POA: Insufficient documentation

## 2014-03-12 DIAGNOSIS — I6521 Occlusion and stenosis of right carotid artery: Secondary | ICD-10-CM | POA: Insufficient documentation

## 2014-03-12 DIAGNOSIS — I6523 Occlusion and stenosis of bilateral carotid arteries: Secondary | ICD-10-CM

## 2014-03-12 NOTE — Patient Instructions (Signed)
Stroke Prevention Some medical conditions and behaviors are associated with an increased chance of having a stroke. You may prevent a stroke by making healthy choices and managing medical conditions. HOW CAN I REDUCE MY RISK OF HAVING A STROKE?   Stay physically active. Get at least 30 minutes of activity on most or all days.  Do not smoke. It may also be helpful to avoid exposure to secondhand smoke.  Limit alcohol use. Moderate alcohol use is considered to be:  No more than 2 drinks per day for men.  No more than 1 drink per day for nonpregnant women.  Eat healthy foods. This involves:  Eating 5 or more servings of fruits and vegetables a day.  Making dietary changes that address high blood pressure (hypertension), high cholesterol, diabetes, or obesity.  Manage your cholesterol levels.  Making food choices that are high in fiber and low in saturated fat, trans fat, and cholesterol may control cholesterol levels.  Take any prescribed medicines to control cholesterol as directed by your health care provider.  Manage your diabetes.  Controlling your carbohydrate and sugar intake is recommended to manage diabetes.  Take any prescribed medicines to control diabetes as directed by your health care provider.  Control your hypertension.  Making food choices that are low in salt (sodium), saturated fat, trans fat, and cholesterol is recommended to manage hypertension.  Take any prescribed medicines to control hypertension as directed by your health care provider.  Maintain a healthy weight.  Reducing calorie intake and making food choices that are low in sodium, saturated fat, trans fat, and cholesterol are recommended to manage weight.  Stop drug abuse.  Avoid taking birth control pills.  Talk to your health care provider about the risks of taking birth control pills if you are over 35 years old, smoke, get migraines, or have ever had a blood clot.  Get evaluated for sleep  disorders (sleep apnea).  Talk to your health care provider about getting a sleep evaluation if you snore a lot or have excessive sleepiness.  Take medicines only as directed by your health care provider.  For some people, aspirin or blood thinners (anticoagulants) are helpful in reducing the risk of forming abnormal blood clots that can lead to stroke. If you have the irregular heart rhythm of atrial fibrillation, you should be on a blood thinner unless there is a good reason you cannot take them.  Understand all your medicine instructions.  Make sure that other conditions (such as anemia or atherosclerosis) are addressed. SEEK IMMEDIATE MEDICAL CARE IF:   You have sudden weakness or numbness of the face, arm, or leg, especially on one side of the body.  Your face or eyelid droops to one side.  You have sudden confusion.  You have trouble speaking (aphasia) or understanding.  You have sudden trouble seeing in one or both eyes.  You have sudden trouble walking.  You have dizziness.  You have a loss of balance or coordination.  You have a sudden, severe headache with no known cause.  You have new chest pain or an irregular heartbeat. Any of these symptoms may represent a serious problem that is an emergency. Do not wait to see if the symptoms will go away. Get medical help at once. Call your local emergency services (911 in [place] Do not drive yourself to the hospital. Document Released: 03/22/2004 Document Revised: 06/29/2013 Document Reviewed: 08/15/2012 ExitCare Patient Information 2015 [employer]. This information is not intended to replace advice given   to you by your health care provider. Make sure you discuss any questions you have with your health care provider.    Smoking Cessation Quitting smoking is important to your health and has many advantages. However, it is not always easy to quit since nicotine is a very addictive drug. Oftentimes, people try 3 times or  more before being able to quit. This document explains the best ways for you to prepare to quit smoking. Quitting takes hard work and a lot of effort, but you can do it. ADVANTAGES OF QUITTING SMOKING  You will live longer, feel better, and live better.  Your body will feel the impact of quitting smoking almost immediately.  Within 20 minutes, blood pressure decreases. Your pulse returns to its normal level.  After 8 hours, carbon monoxide levels in the blood return to normal. Your oxygen level increases.  After 24 hours, the chance of having a heart attack starts to decrease. Your breath, hair, and body stop smelling like smoke.  After 48 hours, damaged nerve endings begin to recover. Your sense of taste and smell improve.  After 72 hours, the body is virtually free of nicotine. Your bronchial tubes relax and breathing becomes easier.  After 2 to 12 weeks, lungs can hold more air. Exercise becomes easier and circulation improves.  The risk of having a heart attack, stroke, cancer, or lung disease is greatly reduced.  After 1 year, the risk of coronary heart disease is cut in half.  After 5 years, the risk of stroke falls to the same as a nonsmoker.  After 10 years, the risk of lung cancer is cut in half and the risk of other cancers decreases significantly.  After 15 years, the risk of coronary heart disease drops, usually to the level of a nonsmoker.  If you are pregnant, quitting smoking will improve your chances of having a healthy baby.  The people you live with, especially any children, will be healthier.  You will have extra money to spend on things other than cigarettes. QUESTIONS TO THINK ABOUT BEFORE ATTEMPTING TO QUIT You may want to talk about your answers with your health care provider.  Why do you want to quit?  If you tried to quit in the past, what helped and what did not?  What will be the most difficult situations for you after you quit? How will you plan to  handle them?  Who can help you through the tough times? Your family? Friends? A health care provider?  What pleasures do you get from smoking? What ways can you still get pleasure if you quit? Here are some questions to ask your health care provider:  How can you help me to be successful at quitting?  What medicine do you think would be best for me and how should I take it?  What should I do if I need more help?  What is smoking withdrawal like? How can I get information on withdrawal? GET READY  Set a quit date.  Change your environment by getting rid of all cigarettes, ashtrays, matches, and lighters in your home, car, or work. Do not let people smoke in your home.  Review your past attempts to quit. Think about what worked and what did not. GET SUPPORT AND ENCOURAGEMENT You have a better chance of being successful if you have help. You can get support in many ways.  Tell your family, friends, and coworkers that you are going to quit and need their support. Ask them   not to smoke around you.  Get individual, group, or telephone counseling and support. Programs are available at local hospitals and health centers. Call your local health department for information about programs in your area.  Spiritual beliefs and practices may help some smokers quit.  Download a "quit meter" on your computer to keep track of quit statistics, such as how long you have gone without smoking, cigarettes not smoked, and money saved.  Get a self-help book about quitting smoking and staying off tobacco. LEARN NEW SKILLS AND BEHAVIORS  Distract yourself from urges to smoke. Talk to someone, go for a walk, or occupy your time with a task.  Change your normal routine. Take a different route to work. Drink tea instead of coffee. Eat breakfast in a different place.  Reduce your stress. Take a hot bath, exercise, or read a book.  Plan something enjoyable to do every day. Reward yourself for not  smoking.  Explore interactive web-based programs that specialize in helping you quit. GET MEDICINE AND USE IT CORRECTLY Medicines can help you stop smoking and decrease the urge to smoke. Combining medicine with the above behavioral methods and support can greatly increase your chances of successfully quitting smoking.  Nicotine replacement therapy helps deliver nicotine to your body without the negative effects and risks of smoking. Nicotine replacement therapy includes nicotine gum, lozenges, inhalers, nasal sprays, and skin patches. Some may be available over-the-counter and others require a prescription.  Antidepressant medicine helps people abstain from smoking, but how this works is unknown. This medicine is available by prescription.  Nicotinic receptor partial agonist medicine simulates the effect of nicotine in your brain. This medicine is available by prescription. Ask your health care provider for advice about which medicines to use and how to use them based on your health history. Your health care provider will tell you what side effects to look out for if you choose to be on a medicine or therapy. Carefully read the information on the package. Do not use any other product containing nicotine while using a nicotine replacement product.  RELAPSE OR DIFFICULT SITUATIONS Most relapses occur within the first 3 months after quitting. Do not be discouraged if you start smoking again. Remember, most people try several times before finally quitting. You may have symptoms of withdrawal because your body is used to nicotine. You may crave cigarettes, be irritable, feel very hungry, cough often, get headaches, or have difficulty concentrating. The withdrawal symptoms are only temporary. They are strongest when you first quit, but they will go away within 10-14 days. To reduce the chances of relapse, try to:  Avoid drinking alcohol. Drinking lowers your chances of successfully quitting.  Reduce the  amount of caffeine you consume. Once you quit smoking, the amount of caffeine in your body increases and can give you symptoms, such as a rapid heartbeat, sweating, and anxiety.  Avoid smokers because they can make you want to smoke.  Do not let weight gain distract you. Many smokers will gain weight when they quit, usually less than 10 pounds. Eat a healthy diet and stay active. You can always lose the weight gained after you quit.  Find ways to improve your mood other than smoking. FOR MORE INFORMATION  www.smokefree.gov  Document Released: 02/06/2001 Document Revised: 06/29/2013 Document Reviewed: 05/24/2011 ExitCare Patient Information 2015 [employer]. This information is not intended to replace advice given to you by your health care provider. Make sure you discuss any questions you have with your health   care provider.    Smoking Cessation, Tips for Success If you are ready to quit smoking, congratulations! You have chosen to help yourself be healthier. Cigarettes bring nicotine, tar, carbon monoxide, and other irritants into your body. Your lungs, heart, and blood vessels will be able to work better without these poisons. There are many different ways to quit smoking. Nicotine gum, nicotine patches, a nicotine inhaler, or nicotine nasal spray can help with physical craving. Hypnosis, support groups, and medicines help break the habit of smoking. WHAT THINGS CAN I DO TO MAKE QUITTING EASIER?  Here are some tips to help you quit for good:  Pick a date when you will quit smoking completely. Tell all of your friends and family about your plan to quit on that date.  Do not try to slowly cut down on the number of cigarettes you are smoking. Pick a quit date and quit smoking completely starting on that day.  Throw away all cigarettes.   Clean and remove all ashtrays from your home, work, and car.  On a card, write down your reasons for quitting. Carry the card with you and read it  when you get the urge to smoke.  Cleanse your body of nicotine. Drink enough water and fluids to keep your urine clear or pale yellow. Do this after quitting to flush the nicotine from your body.  Learn to predict your moods. Do not let a bad situation be your excuse to have a cigarette. Some situations in your life might tempt you into wanting a cigarette.  Never have "just one" cigarette. It leads to wanting another and another. Remind yourself of your decision to quit.  Change habits associated with smoking. If you smoked while driving or when feeling stressed, try other activities to replace smoking. Stand up when drinking your coffee. Brush your teeth after eating. Sit in a different chair when you read the paper. Avoid alcohol while trying to quit, and try to drink fewer caffeinated beverages. Alcohol and caffeine may urge you to smoke.  Avoid foods and drinks that can trigger a desire to smoke, such as sugary or spicy foods and alcohol.  Ask people who smoke not to smoke around you.  Have something planned to do right after eating or having a cup of coffee. For example, plan to take a walk or exercise.  Try a relaxation exercise to calm you down and decrease your stress. Remember, you may be tense and nervous for the first 2 weeks after you quit, but this will pass.  Find new activities to keep your hands busy. Play with a pen, coin, or rubber band. Doodle or draw things on paper.  Brush your teeth right after eating. This will help cut down on the craving for the taste of tobacco after meals. You can also try mouthwash.   Use oral substitutes in place of cigarettes. Try using lemon drops, carrots, cinnamon sticks, or chewing gum. Keep them handy so they are available when you have the urge to smoke.  When you have the urge to smoke, try deep breathing.  Designate your home as a nonsmoking area.  If you are a heavy smoker, ask your health care provider about a prescription for  nicotine chewing gum. It can ease your withdrawal from nicotine.  Reward yourself. Set aside the cigarette money you save and buy yourself something nice.  Look for support from others. Join a support group or smoking cessation program. Ask someone at home or at work   to help you with your plan to quit smoking.  Always ask yourself, "Do I need this cigarette or is this just a reflex?" Tell yourself, "Today, I choose not to smoke," or "I do not want to smoke." You are reminding yourself of your decision to quit.  Do not replace cigarette smoking with electronic cigarettes (commonly called e-cigarettes). The safety of e-cigarettes is unknown, and some may contain harmful chemicals.  If you relapse, do not give up! Plan ahead and think about what you will do the next time you get the urge to smoke. HOW WILL I FEEL WHEN I QUIT SMOKING? You may have symptoms of withdrawal because your body is used to nicotine (the addictive substance in cigarettes). You may crave cigarettes, be irritable, feel very hungry, cough often, get headaches, or have difficulty concentrating. The withdrawal symptoms are only temporary. They are strongest when you first quit but will go away within 10-14 days. When withdrawal symptoms occur, stay in control. Think about your reasons for quitting. Remind yourself that these are signs that your body is healing and getting used to being without cigarettes. Remember that withdrawal symptoms are easier to treat than the major diseases that smoking can cause.  Even after the withdrawal is over, expect periodic urges to smoke. However, these cravings are generally short lived and will go away whether you smoke or not. Do not smoke! WHAT RESOURCES ARE AVAILABLE TO HELP ME QUIT SMOKING? Your health care provider can direct you to community resources or hospitals for support, which may include:  Group support.  Education.  Hypnosis.  Therapy. Document Released: 11/11/2003 Document  Revised: 06/29/2013 Document Reviewed: 07/31/2012 ExitCare Patient Information 2015 [employer]. This information is not intended to replace advice given to you by your health care provider. Make sure you discuss any questions you have with your health care provider.  

## 2014-03-12 NOTE — Progress Notes (Signed)
Established Carotid Patient   History of Present Illness  April Shaffer is a 64 y.o. female female patient of Dr. Arbie Cookey who is status post right CEA on 09/27/11 with subsequent occlusion.  Patient has Negative history of TIA or stroke symptoms. The patient denies amaurosis fugax or monocular blindness. The patient denies facial drooping.  Pt. denies hemiplegia. The patient denies receptive or expressive aphasia. Pt. denies extremity weakness.  Patient reports that her blood pressure is usually about 126 systolic, goes up in Dr. offices.  She has a history of bilateral lower legs vein injections for varicosities. She denies claudication symptoms with walking, denies non healing wounds. Her toes ache, she was recently diagnosed with DM neuropathy, she is seeing a chiropractor that is helping with this. Pt denies any history of MI or cardiac problems. Walks almost 8 hours/day as part of her job.  Patient denies New Medical or Surgical History.   Pt Diabetic: Yes, states in good control  Pt smoker: smoker (2-3 cigarettes/day x 40+ yrs)   Pt meds include:  Statin : Yes  Betablocker: Yes  ASA: Yes  Other anticoagulants/antiplatelets: Plavix   Past Medical History  Diagnosis Date  . Diabetes mellitus, type 2   . Hypertension   . Hyperlipidemia   . Sleep apnea     hx had tonsilectomy and no longer has   . Arthritis   . Carotid artery narrowing   . Coronary artery disease     Cardiac catheterization November 2013: 50% ostial LAD stenosis 50% mid stenosis. 30% disease in the left circumflex.    Social History History  Substance Use Topics  . Smoking status: Light Tobacco Smoker -- 0.30 packs/day for 25 years    Types: Cigarettes  . Smokeless tobacco: Never Used     Comment: pt states she smokes about 3-4 cigs per day  . Alcohol Use: 1.2 oz/week    2 Shots of liquor per week     Comment: 2 drinks a day    Family History Family History  Problem Relation Age of Onset   . Hypertension Sister   . Hypothyroidism Sister   . Cancer Maternal Grandmother     lung    Surgical History Past Surgical History  Procedure Laterality Date  . Varicose vein surgery  2008    stripping  . Gyn surgery  2004    total hysterectomy for mennorhagia,,salpingoophorectomy  . Epidural block injection  02/2008    Drs. Dorthula Nettles  . Tonsillectomy    . Endarterectomy  09/27/2011    Procedure: ENDARTERECTOMY CAROTID;  Surgeon: Pryor Ochoa, MD;  Location: [facility] OR;  Service: Vascular;  Laterality: Right;  . Cardiac catheterization    . Carotid endarterectomy Right 09-27-11    cea    Allergies  Allergen Reactions  . Lisinopril-Hydrochlorothiazide Other (See Comments)    Angioedema  - Tongue swelling   . Codeine Itching  . Oxycodone   . Tramadol Itching    Current Outpatient Prescriptions  Medication Sig Dispense Refill  . allopurinol (ZYLOPRIM) 100 MG tablet Take 1 tablet (100 mg total) by mouth daily. 30 tablet 11  . aspirin 81 MG tablet Take 1 tablet (81 mg total) by mouth daily. 30 tablet   . carvedilol (COREG) 6.25 MG tablet Take 1 tablet (6.25 mg total) by mouth 2 (two) times daily with a meal. 60 tablet 3  . clopidogrel (PLAVIX) 75 MG tablet TAKE ONE TABLET BY MOUTH EVERY DAY WITH BREAKFAST 30 tablet 6  .  COLCRYS 0.6 MG tablet TAKE ONE TABLET BY MOUTH TWICE DAILY 60 tablet 0  . CRESTOR 20 MG tablet TAKE ONE TABLET BY MOUTH ONCE DAILY 30 tablet 5  . diphenhydrAMINE (BENADRYL) 25 MG tablet Take 25 mg by mouth every 6 (six) hours as needed. For allergies    . gabapentin (NEURONTIN) 100 MG capsule Take 1 tab during the day and 3 tabs at night. If tolerating but still foot issue increase to up to 3 tabs twice daily. 180 capsule 3  . hydrochlorothiazide (HYDRODIURIL) 25 MG tablet TAKE ONE TABLET BY MOUTH ONCE DAILY. 30 tablet 5  . ibuprofen (ADVIL,MOTRIN) 800 MG tablet TAKE ONE TABLET BY MOUTH EVERY 8 HOURS AS NEEDED FOR PAIN 90 tablet 0  . indomethacin (INDOCIN)  50 MG capsule Take 1 capsule (50 mg total) by mouth 2 (two) times daily with a meal. 60 capsule 3  . metFORMIN (GLUCOPHAGE-XR) 750 MG 24 hr tablet TAKE ONE TABLET BY MOUTH EVERY DAY WITH BREAKFAST 90 tablet 1  . naproxen (NAPROSYN) 500 MG tablet Take 1 tablet (500 mg total) by mouth 2 (two) times daily as needed (pain). Take with food 40 tablet 0  . VOLTAREN 1 % GEL APPLY 2 TO 4 GRAMS TOPICALLY DAILY. USUALLY ON HANDS. 100 g 3   No current facility-administered medications for this visit.    Review of Systems : See HPI for pertinent positives and negatives.  Physical Examination  Filed Vitals:   03/12/14 1206 03/12/14 1209  BP: 131/76 131/79  Pulse: 71 79  Resp:  16  Height:  5' 4.5" (1.638 m)  Weight:  147 lb (66.679 kg)  SpO2:  99%   Body mass index is 24.85 kg/(m^2).  General: WDWN female in NAD  GAIT: normal  Eyes: PERRLA  Pulmonary: CTAB, Negative Rales, Negative rhonchi, & Negative wheezing.  Cardiac: regular Rhythm, no murmur appreciated.  VASCULAR EXAM  Carotid Bruits  Left  Right    Positive  Negative   Radial pulses are 2+ palpable and equal.  Aorta is not palpable.  LE Pulses  LEFT  RIGHT   FEMORAL palpable palpable  POPLITEAL  not palpable  not palpable   POSTERIOR TIBIAL  palpable  palpable   DORSALIS PEDIS  ANTERIOR TIBIAL  palpable  palpable    Gastrointestinal: soft, nontender, BS WNL, no r/g, no palpable masses.  Musculoskeletal: Negative muscle atrophy/wasting. M/S 5/5 throughout, Extremities without ischemic changes.  Neurologic: A&O X 3; Appropriate Affect, Speech is normal  CN 2-12 intact, Pain and light touch intact in extremities, Motor exam as listed above.    Non-Invasive Vascular Imaging CAROTID DUPLEX 03/12/2014   CEREBROVASCULAR DUPLEX EVALUATION    INDICATION: Carotid artery disease    PREVIOUS INTERVENTION(S): Right carotid endarterectomy 09/27/2011 with subsequent occlusion.    DUPLEX EXAM:     RIGHT   LEFT  Peak Systolic Velocities (cm/s) End Diastolic Velocities (cm/s) Plaque LOCATION Peak Systolic Velocities (cm/s) End Diastolic Velocities (cm/s) Plaque  98 0  CCA PROXIMAL 103 22   106 15 HT CCA MID 123 33 HT  161 21  CCA DISTAL 125 35 HT  146 17 HT ECA 302 69 HT  OCCLUDED   ICA PROXIMAL 228 61 HT  OCCLUDED   ICA MID 91 24   OCCLUDED   ICA DISTAL 80 31     N/A ICA / CCA Ratio (PSV) 1.82  Antegrade Vertebral Flow Antegrade  150 Brachial Systolic Pressure (mmHg) 148  Triphasic Brachial Artery Waveforms Triphasic  Plaque Morphology:  HM = Homogeneous, HT = Heterogeneous, CP = Calcific Plaque, SP = Smooth Plaque, IP = Irregular Plaque     ADDITIONAL FINDINGS:     IMPRESSION: 1. Known occlusion of the right internal carotid artery. 2. 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  Velocities suggest upper end of range.    Compared to the previous exam:  No significant changes noted when compared to previous exam of 07/24/2013      Assessment: ARDIS LAWLEY is a 64 y.o. female who is status post right CEA on 09/27/11 with subsequent occlusion.  Shepresents with asymptomatic known occlusion of the right internal carotid artery. 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  Velocities suggest upper end of range. No significant changes noted when compared to previous exam of 07/24/2013.  Her DM seems in good control but unfortunately she continues to smoke. See Plan. Face to face time with patient was 20 minutes. Over 50% of this time was spent on counseling and coordination of care.   Plan: Follow-up in 6 months with Carotid Duplex .  The patient was counseled re smoking cessation and given several free resources re smoking cessation.   I discussed in depth with the patient the nature of atherosclerosis, and emphasized the importance of maximal medical management including strict control of blood pressure, blood glucose,  and lipid levels, obtaining regular exercise, and cessation of smoking.  The patient is aware that without maximal medical management the underlying atherosclerotic disease process will progress, limiting the benefit of any interventions. The patient was given information about stroke prevention and what symptoms should prompt the patient to seek immediate medical care. Thank you for allowing [place] to participate in this patient's care.  Charisse March, RN, MSN, FNP-C [facility] Office: [phone]  Clinic Physician: Imogene Burn  03/12/2014  12:19 PM

## 2014-03-12 NOTE — Telephone Encounter (Signed)
Last office visit 12/22/2013.  Last refilled 09/29/2013 for #90 with no refills.  Ok to refill?

## 2014-03-31 ENCOUNTER — Telehealth: Payer: Self-pay | Admitting: *Deleted

## 2014-03-31 ENCOUNTER — Other Ambulatory Visit: Payer: Self-pay | Admitting: Family Medicine

## 2014-03-31 MED ORDER — COLCHICINE 0.6 MG PO CAPS: 1.0000 | ORAL_CAPSULE | Freq: Two times a day (BID) | ORAL | Status: DC

## 2014-03-31 NOTE — Telephone Encounter (Signed)
[facility] from [facility] stating that insurance will not cover the colcrys 0.6 mg without a prior authorization.  States insurance prefers colchicine 0.6 mg capsules.  Asking if it is okay to change.  Per Dr. Patsy Lageropland, ok to change.  [facility]

## 2014-04-05 ENCOUNTER — Other Ambulatory Visit: Payer: Self-pay | Admitting: *Deleted

## 2014-04-05 MED ORDER — INDOMETHACIN 50 MG PO CAPS: 50.0000 mg | ORAL_CAPSULE | Freq: Two times a day (BID) | ORAL | Status: DC

## 2014-04-05 MED ORDER — COLCHICINE 0.6 MG PO CAPS: 1.0000 | ORAL_CAPSULE | Freq: Two times a day (BID) | ORAL | Status: DC

## 2014-04-05 NOTE — Telephone Encounter (Addendum)
Received PA request for colchicine 0.6 mg caps also.  PA completed on CoverMyMeds.  PA Denied-Patient must try Mitigare first.  [facility]are [facility].

## 2014-04-15 ENCOUNTER — Ambulatory Visit (INDEPENDENT_AMBULATORY_CARE_PROVIDER_SITE_OTHER): Payer: 59 | Admitting: Family Medicine

## 2014-04-15 ENCOUNTER — Encounter: Payer: Self-pay | Admitting: Family Medicine

## 2014-04-15 VITALS — BP 140/64 | HR 66 | Temp 98.3°F | Ht 64.5 in | Wt 149.5 lb

## 2014-04-15 DIAGNOSIS — M5416 Radiculopathy, lumbar region: Secondary | ICD-10-CM

## 2014-04-15 DIAGNOSIS — Z23 Encounter for immunization: Secondary | ICD-10-CM

## 2014-04-15 MED ORDER — TRAMADOL HCL 50 MG PO TABS: 50.0000 mg | ORAL_TABLET | Freq: Three times a day (TID) | ORAL | Status: DC | PRN

## 2014-04-15 MED ORDER — PREDNISONE 20 MG PO TABS: ORAL_TABLET | ORAL | Status: DC

## 2014-04-15 NOTE — Addendum Note (Signed)
Addended by: Damita LackLORING, Jonathandavid Marlett S on: 04/15/2014 03:30 PM   Modules accepted: Orders

## 2014-04-15 NOTE — Assessment & Plan Note (Signed)
Prednisone taper, home PT.  Follow up in 2 week for X-ray, PT etc. If not improving as expected.

## 2014-04-15 NOTE — Progress Notes (Signed)
   Subjective:    Patient ID: April Shaffer, female    DOB: 08/13/1950, 64 y.o.   MRN: [id number]  HPI  64 year old female with history of right sided radicular lumbar back pain, DM CAD, gout presents with new onset left low back pain.. Radiates to elft buttock and down left leg.  Worse with standing and walking. No weakness, n  Numbness.  Ongoing x 2 days. 8-10/10 on pain scale. No fall, no known injury, no new activity She took 800 mg ibuprofen.Marland Kitchen. Helped some temporarily.  tooks some tramadol alst night with benadryl .Marland Kitchen. Helped some.  No past back surgery. Had ESI in low back. Works making beds.  No dysuria, no urinary frequency. No fever. No incontinence. NO diarrhea, no abdominal pain.   Review of Systems  Constitutional: Negative for fever and fatigue.  HENT: Negative for ear pain.   Eyes: Negative for pain.  Respiratory: Negative for chest tightness and shortness of breath.   Cardiovascular: Negative for chest pain, palpitations and leg swelling.  Gastrointestinal: Negative for abdominal pain.  Genitourinary: Negative for dysuria.  Musculoskeletal: Positive for back pain and gait problem.       Objective:   Physical Exam  Constitutional: Vital signs are normal. She appears well-developed and well-nourished. She is cooperative.  Non-toxic appearance. She does not appear ill. No distress.  HENT:  Head: Normocephalic.  Right Ear: Hearing, tympanic membrane, external ear and ear canal normal. Tympanic membrane is not erythematous, not retracted and not bulging.  Left Ear: Hearing, tympanic membrane, external ear and ear canal normal. Tympanic membrane is not erythematous, not retracted and not bulging.  Nose: No mucosal edema or rhinorrhea. Right sinus exhibits no maxillary sinus tenderness and no frontal sinus tenderness. Left sinus exhibits no maxillary sinus tenderness and no frontal sinus tenderness.  Mouth/Throat: Uvula is midline, oropharynx is clear and moist and  mucous membranes are normal.  Eyes: Conjunctivae, EOM and lids are normal. Pupils are equal, round, and reactive to light. Lids are everted and swept, no foreign bodies found.  Neck: Trachea normal and normal range of motion. Neck supple. Carotid bruit is not present. No thyroid mass and no thyromegaly present.  Cardiovascular: Normal rate, regular rhythm, S1 normal, S2 normal, normal heart sounds, intact distal pulses and normal pulses.  Exam reveals no gallop and no friction rub.   No murmur heard. Pulmonary/Chest: Effort normal and breath sounds normal. No tachypnea. No respiratory distress. She has no decreased breath sounds. She has no wheezes. She has no rhonchi. She has no rales.  Abdominal: Soft. Normal appearance and bowel sounds are normal. There is no tenderness.  Musculoskeletal:       Lumbar back: She exhibits decreased range of motion and tenderness. She exhibits no bony tenderness.  Pos SLR on left, neg faber's  Neurological: She is alert. She has normal strength. No sensory deficit. She exhibits normal muscle tone. Gait abnormal. Coordination normal.  Antalgic gait  Skin: Skin is warm, dry and intact. No rash noted.  Psychiatric: Her speech is normal and behavior is normal. Judgment and thought content normal. Her mood appears not anxious. Cognition and memory are normal. She does not exhibit a depressed mood.          Assessment & Plan:

## 2014-04-15 NOTE — Progress Notes (Signed)
Pre visit review using our clinic review tool, if applicable. No additional management support is needed unless otherwise documented below in the visit note. 

## 2014-04-15 NOTE — Patient Instructions (Addendum)
Start prednisone taper. Start home PT exercises. Can use tramadol for pain. Take with benadryl.  Follow up in 2 weeks if pain not improvingas expected with prednisone.  Call sooner if severe pain.

## 2014-05-14 ENCOUNTER — Other Ambulatory Visit: Payer: Self-pay | Admitting: Family Medicine

## 2014-05-17 ENCOUNTER — Other Ambulatory Visit: Payer: Self-pay | Admitting: *Deleted

## 2014-05-17 MED ORDER — METFORMIN HCL ER 750 MG PO TB24: ORAL_TABLET | ORAL | Status: DC

## 2014-06-01 ENCOUNTER — Telehealth: Payer: Self-pay | Admitting: Family Medicine

## 2014-06-01 DIAGNOSIS — E785 Hyperlipidemia, unspecified: Secondary | ICD-10-CM

## 2014-06-01 DIAGNOSIS — E1142 Type 2 diabetes mellitus with diabetic polyneuropathy: Secondary | ICD-10-CM

## 2014-06-01 DIAGNOSIS — M1A09X0 Idiopathic chronic gout, multiple sites, without tophus (tophi): Secondary | ICD-10-CM

## 2014-06-01 NOTE — Telephone Encounter (Signed)
-----   Message from Alvina Chouerri J Walsh sent at 05/31/2014  3:31 PM EDT ----- Regarding: Lab orders for Thursday, 4.7.16 Lab orders for a 6 month f/U

## 2014-06-02 LAB — HM DIABETES EYE EXAM

## 2014-06-03 ENCOUNTER — Other Ambulatory Visit (INDEPENDENT_AMBULATORY_CARE_PROVIDER_SITE_OTHER): Payer: 59

## 2014-06-03 DIAGNOSIS — E785 Hyperlipidemia, unspecified: Secondary | ICD-10-CM

## 2014-06-03 DIAGNOSIS — E1142 Type 2 diabetes mellitus with diabetic polyneuropathy: Secondary | ICD-10-CM

## 2014-06-03 DIAGNOSIS — G629 Polyneuropathy, unspecified: Secondary | ICD-10-CM

## 2014-06-03 LAB — COMPREHENSIVE METABOLIC PANEL
ALK PHOS: 81 U/L (ref 39–117)
ALT: 11 U/L (ref 0–35)
AST: 21 U/L (ref 0–37)
Albumin: 3.9 g/dL (ref 3.5–5.2)
BUN: 23 mg/dL (ref 6–23)
CALCIUM: 9.5 mg/dL (ref 8.4–10.5)
CHLORIDE: 105 meq/L (ref 96–112)
CO2: 28 mEq/L (ref 19–32)
Creatinine, Ser: 1.01 mg/dL (ref 0.40–1.20)
GFR: 71.43 mL/min (ref >60.00)
GLUCOSE: 102 mg/dL — AB (ref 70–99)
Potassium: 3.7 mEq/L (ref 3.5–5.1)
SODIUM: 138 meq/L (ref 135–145)
TOTAL PROTEIN: 7.9 g/dL (ref 6.0–8.3)
Total Bilirubin: 0.5 mg/dL (ref 0.2–1.2)

## 2014-06-03 LAB — HEMOGLOBIN A1C: Hgb A1c MFr Bld: 6.5 % (ref 4.6–6.5)

## 2014-06-03 LAB — LIPID PANEL
CHOL/HDL RATIO: 3
CHOLESTEROL: 161 mg/dL (ref 0–200)
HDL: 48.3 mg/dL (ref >39.00)
LDL Cholesterol: 93 mg/dL (ref 0–99)
NonHDL: 112.7
Triglycerides: 99 mg/dL (ref 0.0–149.0)
VLDL: 19.8 mg/dL (ref 0.0–40.0)

## 2014-06-07 ENCOUNTER — Other Ambulatory Visit: Payer: Self-pay | Admitting: Family Medicine

## 2014-06-08 ENCOUNTER — Encounter: Payer: Self-pay | Admitting: Family Medicine

## 2014-06-08 ENCOUNTER — Ambulatory Visit (INDEPENDENT_AMBULATORY_CARE_PROVIDER_SITE_OTHER): Payer: 59 | Admitting: Family Medicine

## 2014-06-08 VITALS — BP 140/72 | HR 73 | Temp 97.7°F | Ht 64.5 in | Wt 149.2 lb

## 2014-06-08 DIAGNOSIS — M1A09X0 Idiopathic chronic gout, multiple sites, without tophus (tophi): Secondary | ICD-10-CM

## 2014-06-08 DIAGNOSIS — E785 Hyperlipidemia, unspecified: Secondary | ICD-10-CM | POA: Diagnosis not present

## 2014-06-08 DIAGNOSIS — I10 Essential (primary) hypertension: Secondary | ICD-10-CM | POA: Diagnosis not present

## 2014-06-08 DIAGNOSIS — E1142 Type 2 diabetes mellitus with diabetic polyneuropathy: Secondary | ICD-10-CM | POA: Diagnosis not present

## 2014-06-08 DIAGNOSIS — G629 Polyneuropathy, unspecified: Secondary | ICD-10-CM

## 2014-06-08 LAB — HM DIABETES FOOT EXAM

## 2014-06-08 NOTE — Assessment & Plan Note (Signed)
Well controlled. Continue current medication.  

## 2014-06-08 NOTE — Patient Instructions (Signed)
Try to increase exercsie with a dedicated time for exercise 30 min 5 times a week. Decrease bacon, sausage in AMs. Continue crestor daily and try trial of zetia daily.

## 2014-06-08 NOTE — Progress Notes (Signed)
Pre visit review using our clinic review tool, if applicable. No additional management support is needed unless otherwise documented below in the visit note. 

## 2014-06-08 NOTE — Assessment & Plan Note (Signed)
Well controlled on allopurinol

## 2014-06-08 NOTE — Assessment & Plan Note (Signed)
Well controlled on current med.

## 2014-06-08 NOTE — Assessment & Plan Note (Signed)
LDL not at goal on crestor. Goal < 70 given carotid stenosis.  Pt states she may be able to make some lifestyle change but is agreeable to trial of zetia as well.

## 2014-06-08 NOTE — Progress Notes (Signed)
64 year old female presents for 6 month follow up.  Recent  illness, now resolved.  Diabetes: Great control on glucophage alone.  Lab Results  Component Value Date   HGBA1C 6.5 06/03/2014  Using medications without difficulties: None  Hypoglycemic episodes:none  Hyperglycemic episodes:none  Feet problems: no ulcers, she does have neuropathy (higher dose of gabapentin has not helped much, but not bothering her too much.) Blood Sugars averaging: postprandial 102 Eye exam: 05/2014  Wt Readings from Last 3 Encounters:  06/08/14 149 lb 4 oz (67.699 kg)  04/15/14 149 lb 8 oz (67.813 kg)  03/12/14 147 lb (66.679 kg)    Hypertension:Well controlled at home on HCTZ , had angioedema with lisinopril. Now on coreg. She did not take med today. BP Readings from Last 3 Encounters:  06/08/14 140/72  04/15/14 140/64  03/12/14 131/79  Using medication without problems or lightheadedness: None  Chest pain with exertion:None  Edema:None  Short of breath:None  Average home BPs: not checking lately.  Elevated Cholesterol: On crestor 20 mg. Goal <70 given carotid stenosis   was out for a short time. Lab Results  Component Value Date   CHOL 161 06/03/2014   HDL 48.30 06/03/2014   LDLCALC 93 06/03/2014   LDLDIRECT 139.7 03/28/2012   TRIG 99.0 06/03/2014   CHOLHDL 3 06/03/2014  Diet: Moderate  Exercise: walking fdaily Other complaints: No muscle pain.   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  She is not bothered by this and does not want to change meds or increase at this time.  Gout : On allopurinol uric acid in 12/2013 was 6.2  No current flare.   Review of Systems  Constitutional: Negative for fever and fatigue.  HENT: Negative for ear pain.  Eyes: Negative for pain.  Respiratory: Negative for chest tightness and shortness of breath.  Cardiovascular: Negative for chest pain, palpitations and leg  swelling.  Gastrointestinal: Negative for abdominal pain.  Genitourinary: Negative for dysuria.  Objective:   Physical Exam  Constitutional: Vital signs are normal. She appears well-developed and well-nourished. She is cooperative. Non-toxic appearance. She does not appear ill. No distress.  HENT:  Head: Normocephalic.  Right Ear: Hearing, tympanic membrane, external ear and ear canal normal. Tympanic membrane is not erythematous, not retracted and not bulging.  Left Ear: Hearing, tympanic membrane, external ear and ear canal normal. Tympanic membrane is not erythematous, not retracted and not bulging.  Nose: No mucosal edema or rhinorrhea. Right sinus exhibits no maxillary sinus tenderness and no frontal sinus tenderness. Left sinus exhibits no maxillary sinus tenderness and no frontal sinus tenderness.  Mouth/Throat: Uvula is midline, oropharynx is clear and moist and mucous membranes are normal.  Eyes: Conjunctivae normal, EOM and lids are normal. Pupils are equal, round, and reactive to light. No foreign bodies found.  Neck: Trachea normal and normal range of motion. Neck supple. Carotid bruit is not present. No mass and no thyromegaly present.  Cardiovascular: Normal rate, regular rhythm, S1 normal, S2 normal, normal heart sounds, intact distal pulses and normal pulses. Exam reveals no gallop and no friction rub.  No murmur heard. No bruit,CEA scarring on neck  Pulmonary/Chest: Effort normal and breath sounds normal. Not tachypneic. No respiratory distress. She has no decreased breath sounds. She has no wheezes. She has no rhonchi. She has no rales.  Neurological: She is alert.  Skin: Skin is warm, dry and intact. No rash noted.  Psychiatric:  Her speech is normal and behavior is normal. Judgment and thought content normal. Her mood appears not anxious. Cognition and memory are normal. She does not exhibit a depressed mood.    Diabetic foot exam:  Normal inspection  No  skin breakdown  No calluses  Normal DP pulses  Normal sensation to light touch and monofilament  Nails normal

## 2014-06-11 ENCOUNTER — Telehealth: Payer: Self-pay

## 2014-06-11 MED ORDER — EZETIMIBE 10 MG PO TABS: 10.0000 mg | ORAL_TABLET | Freq: Every day | ORAL | Status: DC

## 2014-06-11 NOTE — Telephone Encounter (Signed)
Zetia sent in now.

## 2014-06-11 NOTE — Telephone Encounter (Signed)
Pt was seen 06/08/14 and thought was going to start new med for cholesterol;[facility].Please advise. Is Z[facility] to be sent to [facility]?

## 2014-06-11 NOTE — Telephone Encounter (Signed)
Ms. April Shaffer notified Zetia prescription has been [facility].

## 2014-06-22 ENCOUNTER — Other Ambulatory Visit: Payer: Self-pay | Admitting: Family Medicine

## 2014-07-06 ENCOUNTER — Encounter: Payer: Self-pay | Admitting: Family Medicine

## 2014-07-29 ENCOUNTER — Other Ambulatory Visit: Payer: Self-pay | Admitting: Family Medicine

## 2014-09-07 ENCOUNTER — Telehealth: Payer: Self-pay | Admitting: Family Medicine

## 2014-09-07 DIAGNOSIS — E1142 Type 2 diabetes mellitus with diabetic polyneuropathy: Secondary | ICD-10-CM

## 2014-09-07 DIAGNOSIS — E785 Hyperlipidemia, unspecified: Secondary | ICD-10-CM

## 2014-09-07 NOTE — Telephone Encounter (Signed)
-----   Message from Alvina Chouerri J Walsh sent at 09/01/2014  3:27 PM EDT ----- Regarding: Lab orders for Wednesday,7.13.16 Lab orders for a f/u appt

## 2014-09-08 ENCOUNTER — Other Ambulatory Visit (INDEPENDENT_AMBULATORY_CARE_PROVIDER_SITE_OTHER): Payer: 59

## 2014-09-08 ENCOUNTER — Encounter: Payer: Self-pay | Admitting: Family

## 2014-09-08 DIAGNOSIS — E1142 Type 2 diabetes mellitus with diabetic polyneuropathy: Secondary | ICD-10-CM

## 2014-09-08 DIAGNOSIS — G629 Polyneuropathy, unspecified: Secondary | ICD-10-CM

## 2014-09-08 DIAGNOSIS — E785 Hyperlipidemia, unspecified: Secondary | ICD-10-CM

## 2014-09-08 LAB — COMPREHENSIVE METABOLIC PANEL
ALK PHOS: 78 U/L (ref 39–117)
ALT: 10 U/L (ref 0–35)
AST: 19 U/L (ref 0–37)
Albumin: 3.8 g/dL (ref 3.5–5.2)
BILIRUBIN TOTAL: 0.6 mg/dL (ref 0.2–1.2)
BUN: 17 mg/dL (ref 6–23)
CO2: 29 meq/L (ref 19–32)
CREATININE: 0.98 mg/dL (ref 0.40–1.20)
Calcium: 9 mg/dL (ref 8.4–10.5)
Chloride: 101 mEq/L (ref 96–112)
GFR: 73.89 mL/min (ref >60.00)
Glucose, Bld: 115 mg/dL — ABNORMAL HIGH (ref 70–99)
Potassium: 3.2 mEq/L — ABNORMAL LOW (ref 3.5–5.1)
SODIUM: 138 meq/L (ref 135–145)
TOTAL PROTEIN: 7.7 g/dL (ref 6.0–8.3)

## 2014-09-08 LAB — HEMOGLOBIN A1C: Hgb A1c MFr Bld: 6.1 % (ref 4.6–6.5)

## 2014-09-08 LAB — LIPID PANEL
Cholesterol: 155 mg/dL (ref 0–200)
HDL: 58.9 mg/dL (ref >39.00)
LDL CALC: 78 mg/dL (ref 0–99)
NONHDL: 96.1
Total CHOL/HDL Ratio: 3
Triglycerides: 89 mg/dL (ref 0.0–149.0)
VLDL: 17.8 mg/dL (ref 0.0–40.0)

## 2014-09-08 LAB — MICROALBUMIN / CREATININE URINE RATIO
Creatinine,U: 258.2 mg/dL
MICROALB/CREAT RATIO: 3.4 mg/g (ref 0.0–30.0)
Microalb, Ur: 8.7 mg/dL — ABNORMAL HIGH (ref 0.0–1.9)

## 2014-09-08 NOTE — Addendum Note (Signed)
Addended by: Baldomero LamyHAVERS, NATASHA C on: 09/08/2014 09:42 AM   Modules accepted: Orders

## 2014-09-10 ENCOUNTER — Ambulatory Visit: Payer: 59 | Admitting: Family

## 2014-09-10 ENCOUNTER — Encounter (HOSPITAL_COMMUNITY): Payer: Self-pay

## 2014-09-17 ENCOUNTER — Telehealth: Payer: Self-pay | Admitting: Family Medicine

## 2014-09-17 NOTE — Telephone Encounter (Signed)
Please apologize to pt , I thought I saw she had a 3 month follow up. It is okay if we wait until 11/2014 giuven things look so good. Notify pt labs look good. Good diabetes control,  Chol control much better with zetia and almost at goal LDL < 70 ( at 78),  Only abnormal was potassium slightly low ( increase high potassium foods).  Okay to keep follow up as scheduled unless issue arises.

## 2014-09-17 NOTE — Telephone Encounter (Signed)
Have her look into coverage of welchol.

## 2014-09-17 NOTE — Telephone Encounter (Signed)
April Shaffer notified to check with her pharmacy and see how much a Rx for Welchol tablet or powder would cost her.  Advised to call us back if the cost is cheaper and she would like to switch from Zetia to [place].

## 2014-09-17 NOTE — Telephone Encounter (Signed)
Pt called inquiring about lab results and if she was needing to be seen. I did confirm her next appts in October 2016 for labs and her cpe. She asked if she could please get a call back regarding her lab results and if she needs to come in sooner than the appts in October. The best number to reach her is [phone].

## 2014-09-17 NOTE — Telephone Encounter (Signed)
Left message for April Shaffer to return my call. 

## 2014-09-17 NOTE — Telephone Encounter (Signed)
Ms. Menger notified as instructed by telephone.  She wanted to let Dr. Ermalene Searing know that she is now retired and the Zetia co-pay is $60.  Would like to find something a cholesterol medication that is cheaper.  Please advise.

## 2014-12-07 ENCOUNTER — Telehealth: Payer: Self-pay | Admitting: Family Medicine

## 2014-12-07 ENCOUNTER — Other Ambulatory Visit (INDEPENDENT_AMBULATORY_CARE_PROVIDER_SITE_OTHER): Payer: 59

## 2014-12-07 DIAGNOSIS — M1A09X0 Idiopathic chronic gout, multiple sites, without tophus (tophi): Secondary | ICD-10-CM

## 2014-12-07 DIAGNOSIS — E1142 Type 2 diabetes mellitus with diabetic polyneuropathy: Secondary | ICD-10-CM | POA: Diagnosis not present

## 2014-12-07 LAB — COMPREHENSIVE METABOLIC PANEL
ALBUMIN: 4 g/dL (ref 3.5–5.2)
ALK PHOS: 84 U/L (ref 39–117)
ALT: 14 U/L (ref 0–35)
AST: 25 U/L (ref 0–37)
BILIRUBIN TOTAL: 0.4 mg/dL (ref 0.2–1.2)
BUN: 20 mg/dL (ref 6–23)
CO2: 25 mEq/L (ref 19–32)
CREATININE: 0.93 mg/dL (ref 0.40–1.20)
Calcium: 9.4 mg/dL (ref 8.4–10.5)
Chloride: 105 mEq/L (ref 96–112)
GFR: 78.43 mL/min (ref >60.00)
Glucose, Bld: 99 mg/dL (ref 70–99)
Potassium: 3.7 mEq/L (ref 3.5–5.1)
Sodium: 140 mEq/L (ref 135–145)
Total Protein: 7.9 g/dL (ref 6.0–8.3)

## 2014-12-07 LAB — URIC ACID: URIC ACID, SERUM: 6.8 mg/dL (ref 2.4–7.0)

## 2014-12-07 LAB — LIPID PANEL
CHOLESTEROL: 162 mg/dL (ref 0–200)
HDL: 60.6 mg/dL (ref >39.00)
LDL Cholesterol: 87 mg/dL (ref 0–99)
NONHDL: 101.67
Total CHOL/HDL Ratio: 3
Triglycerides: 75 mg/dL (ref 0.0–149.0)
VLDL: 15 mg/dL (ref 0.0–40.0)

## 2014-12-07 LAB — HEMOGLOBIN A1C: HEMOGLOBIN A1C: 5.9 % (ref 4.6–6.5)

## 2014-12-07 NOTE — Telephone Encounter (Signed)
-----   Message from Alvina Chou sent at 12/01/2014  5:05 PM EDT ----- Regarding: Lab orders for Tuesday, 10.11.16 Patient is scheduled for CPX labs, please order future labs, Thanks , Camelia Eng

## 2014-12-13 ENCOUNTER — Other Ambulatory Visit: Payer: Self-pay | Admitting: *Deleted

## 2014-12-13 NOTE — Telephone Encounter (Signed)
Last office visit 06/08/2014.  Last refilled 03/12/2014 for #90 with no refills.  Ok to refill?

## 2014-12-14 ENCOUNTER — Encounter: Payer: Self-pay | Admitting: Family Medicine

## 2014-12-14 ENCOUNTER — Other Ambulatory Visit: Payer: Self-pay | Admitting: Family Medicine

## 2014-12-14 ENCOUNTER — Ambulatory Visit (INDEPENDENT_AMBULATORY_CARE_PROVIDER_SITE_OTHER): Payer: 59 | Admitting: Family Medicine

## 2014-12-14 VITALS — BP 150/80 | HR 71 | Temp 97.6°F | Ht 64.0 in | Wt 149.8 lb

## 2014-12-14 DIAGNOSIS — Z23 Encounter for immunization: Secondary | ICD-10-CM

## 2014-12-14 DIAGNOSIS — I10 Essential (primary) hypertension: Secondary | ICD-10-CM

## 2014-12-14 DIAGNOSIS — Z1159 Encounter for screening for other viral diseases: Secondary | ICD-10-CM

## 2014-12-14 DIAGNOSIS — Z0000 Encounter for general adult medical examination without abnormal findings: Secondary | ICD-10-CM

## 2014-12-14 DIAGNOSIS — M109 Gout, unspecified: Secondary | ICD-10-CM

## 2014-12-14 DIAGNOSIS — E1142 Type 2 diabetes mellitus with diabetic polyneuropathy: Secondary | ICD-10-CM

## 2014-12-14 DIAGNOSIS — E785 Hyperlipidemia, unspecified: Secondary | ICD-10-CM

## 2014-12-14 LAB — HM DIABETES FOOT EXAM

## 2014-12-14 MED ORDER — IBUPROFEN 800 MG PO TABS: 800.0000 mg | ORAL_TABLET | Freq: Three times a day (TID) | ORAL | Status: DC | PRN

## 2014-12-14 NOTE — Addendum Note (Signed)
Addended by: Damita LackLORING, Tyriq Moragne S on: 12/14/2014 08:21 AM   Modules accepted: Orders

## 2014-12-14 NOTE — Assessment & Plan Note (Signed)
Acute gout flare. Change to indomethacin alone, increase to TID, decrease to lower dose once improving. Pt will hold ibuprofen and colchicine.  Given 2 flares in last 6 month despite uric acid being 6.8 , will increase allopurinol to 200 mg daily.

## 2014-12-14 NOTE — Patient Instructions (Addendum)
Use indomethacin 50 mg three times a day until pain is improving then decrease as rapidly as you can.  Hold colchicine for now. When  gout flare complete over increase allopurinol to 200 mg daily for long term.  Get blood pressure cuff. Follow BP at home.. Call if > 140/90 regularly. Call to schedule mammogram.  Stop at lab on way out for Hep C test.

## 2014-12-14 NOTE — Assessment & Plan Note (Signed)
Excellent control on current regimen. 

## 2014-12-14 NOTE — Progress Notes (Signed)
Pre visit review using our clinic review tool, if applicable. No additional management support is needed unless otherwise documented below in the visit note. 

## 2014-12-14 NOTE — Assessment & Plan Note (Signed)
Not at goal here today, but did not take me before visit. She will get a cuff and follow at home. Continue current meds.

## 2014-12-14 NOTE — Progress Notes (Signed)
Subjective:    Patient ID: April Shaffer, female    DOB: [birth year], 64 y.o.   MRN: [id number]  HPI The patient is here for annual wellness exam and preventative care.    Diabetes: Great control on glucophage alone.  Lab Results  Component Value Date   HGBA1C 5.9 12/07/2014  Using medications without difficulties: None  Hypoglycemic episodes:none  Hyperglycemic episodes:none  Feet problems: no ulcers, she does have neuropathy (higher dose of gabapentin has not helped much, but not bothering her too much.) Blood Sugars averaging: postprandial 100-110 Eye exam: 05/2014   Hypertension:Well controlled at home on HCTZ , had angioedema with lisinopril. Now on coreg. She did not take med today. BP Readings from Last 3 Encounters:  12/14/14 150/80  06/08/14 140/72  04/15/14 140/64   Using medication without problems or lightheadedness: None  Chest pain with exertion:None  Edema:None  Short of breath:None  Average home BPs: not checking lately.  Elevated Cholesterol: Almost at goal on crestor 20 mg. Goal <70 given carotid stenosis  She cannot afford the zetia.  Lab Results  Component Value Date   CHOL 162 12/07/2014   HDL 60.60 12/07/2014   LDLCALC 87 12/07/2014   LDLDIRECT 139.7 03/28/2012   TRIG 75.0 12/07/2014   CHOLHDL 3 12/07/2014  Diet: Moderate  Exercise: walking daily Other complaints: No muscle pain.   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 She is not bothered by this and does not want to change meds or increase at this time.  Gout : On allopurinol uric acid 6.8 She is now having a current flare.  Pain , redness and swelling starting 3 days a go.   Aggravated yesterday with walking.  She has taken colchicine twice daily as well intermittently using  Indomethacin.                   Review of Systems     Objective:   Physical Exam  Constitutional: Vital signs are  normal. She appears well-developed and well-nourished. She is cooperative.  Non-toxic appearance. She does not appear ill. No distress.  HENT:  Head: Normocephalic.  Right Ear: Hearing, tympanic membrane, external ear and ear canal normal.  Left Ear: Hearing, tympanic membrane, external ear and ear canal normal.  Nose: Nose normal.  Eyes: Conjunctivae, EOM and lids are normal. Pupils are equal, round, and reactive to light. Lids are everted and swept, no foreign bodies found.  Neck: Trachea normal and normal range of motion. Neck supple. Carotid bruit is not present. No thyroid mass and no thyromegaly present.  Cardiovascular: Normal rate, regular rhythm, S1 normal, S2 normal, normal heart sounds and intact distal pulses.  Exam reveals no gallop.   No murmur heard. Pulmonary/Chest: Effort normal and breath sounds normal. No respiratory distress. She has no wheezes. She has no rhonchi. She has no rales.  Abdominal: Soft. Normal appearance and bowel sounds are normal. She exhibits no distension, no fluid wave, no abdominal bruit and no mass. There is no hepatosplenomegaly. There is no tenderness. There is no rebound, no guarding and no CVA tenderness. No hernia.  Genitourinary: No breast swelling, tenderness, discharge or bleeding. Pelvic exam was performed with patient supine.  Musculoskeletal:  Left dorsal foot, red, swollen, very tender to touch consistent with gouty flare.  Lymphadenopathy:    She has no cervical adenopathy.    She has no axillary adenopathy.  Neurological: She is alert. She  has normal strength. No cranial nerve deficit or sensory deficit.  Skin: Skin is warm, dry and intact. No rash noted.  Psychiatric: Her speech is normal and behavior is normal. Judgment normal. Her mood appears not anxious. Cognition and memory are normal. She does not exhibit a depressed mood.          Assessment & Plan:  The patient's preventative maintenance and recommended screening tests for an  annual wellness exam were reviewed in full today. Brought up to date unless services declined.  Counselled on the importance of diet, exercise, and its role in overall health and mortality. The patient's FH and SH was reviewed, including their home life, tobacco status, and drug and alcohol status.   Vaccines: Due for flu.. Given . zostavax not covered.  Mammo;11/2013  PAP/DVE: TAH,  No vaginal symptoms.  DEXA:12/2013 normal, repeat in 5 years.  Colon: 2009, hyperplastic polyp, repeat due in 10 years.  hep C: will do today.  XBM:[id number]:refused.

## 2014-12-14 NOTE — Addendum Note (Signed)
Addended by: Alvina ChouWALSH, Eros Montour J on: 12/14/2014 08:30 AM   Modules accepted: Orders

## 2014-12-14 NOTE — Assessment & Plan Note (Signed)
Almost at goal on crestor alone, unable to afford zetia. She will work on [employer] chol doet, decrease animal fats further if able.

## 2014-12-15 ENCOUNTER — Encounter: Payer: Self-pay | Admitting: *Deleted

## 2014-12-15 LAB — HEPATITIS C ANTIBODY: HCV AB: NEGATIVE

## 2014-12-28 ENCOUNTER — Other Ambulatory Visit: Payer: Self-pay

## 2014-12-28 DIAGNOSIS — Z1231 Encounter for screening mammogram for malignant neoplasm of breast: Secondary | ICD-10-CM

## 2015-01-04 ENCOUNTER — Other Ambulatory Visit: Payer: Self-pay | Admitting: Family Medicine

## 2015-01-05 ENCOUNTER — Other Ambulatory Visit: Payer: Self-pay | Admitting: *Deleted

## 2015-01-05 MED ORDER — ROSUVASTATIN CALCIUM 20 MG PO TABS: 20.0000 mg | ORAL_TABLET | Freq: Every day | ORAL | Status: DC

## 2015-01-05 NOTE — Telephone Encounter (Signed)
Patient called requesting prescription for Crestor be changed to generic rosuvastatin.  I spoke to pharmacist to change remaining refills to generic.  Patient is aware.

## 2015-01-13 ENCOUNTER — Ambulatory Visit
Admission: RE | Admit: 2015-01-13 | Discharge: 2015-01-13 | Disposition: A | Discharge status: Home / Self Care | Payer: 59 | Source: Ambulatory Visit

## 2015-01-13 DIAGNOSIS — Z1231 Encounter for screening mammogram for malignant neoplasm of breast: Secondary | ICD-10-CM

## 2015-03-15 ENCOUNTER — Ambulatory Visit (INDEPENDENT_AMBULATORY_CARE_PROVIDER_SITE_OTHER): Payer: Self-pay | Admitting: Internal Medicine

## 2015-03-15 ENCOUNTER — Encounter: Payer: Self-pay | Admitting: Internal Medicine

## 2015-03-15 VITALS — BP 204/98 | HR 84 | Resp 18 | Ht 63.5 in | Wt 150.5 lb

## 2015-03-15 DIAGNOSIS — I779 Disorder of arteries and arterioles, unspecified: Secondary | ICD-10-CM

## 2015-03-15 DIAGNOSIS — M5441 Lumbago with sciatica, right side: Secondary | ICD-10-CM

## 2015-03-15 DIAGNOSIS — E1142 Type 2 diabetes mellitus with diabetic polyneuropathy: Secondary | ICD-10-CM

## 2015-03-15 DIAGNOSIS — M19041 Primary osteoarthritis, right hand: Secondary | ICD-10-CM

## 2015-03-15 DIAGNOSIS — M19042 Primary osteoarthritis, left hand: Secondary | ICD-10-CM

## 2015-03-15 DIAGNOSIS — E785 Hyperlipidemia, unspecified: Secondary | ICD-10-CM

## 2015-03-15 DIAGNOSIS — M1A0710 Idiopathic chronic gout, right ankle and foot, without tophus (tophi): Secondary | ICD-10-CM

## 2015-03-15 DIAGNOSIS — I10 Essential (primary) hypertension: Secondary | ICD-10-CM

## 2015-03-15 DIAGNOSIS — I739 Peripheral vascular disease, unspecified: Secondary | ICD-10-CM

## 2015-03-15 MED ORDER — DICLOFENAC SODIUM 1 % TD GEL: TRANSDERMAL | Status: DC

## 2015-03-15 MED ORDER — METFORMIN HCL ER 500 MG PO TB24: 500.0000 mg | ORAL_TABLET | Freq: Every day | ORAL | Status: DC

## 2015-03-15 MED ORDER — ALLOPURINOL 300 MG PO TABS: 300.0000 mg | ORAL_TABLET | Freq: Every day | ORAL | Status: DC

## 2015-03-15 MED ORDER — CYCLOBENZAPRINE HCL 10 MG PO TABS: ORAL_TABLET | ORAL | Status: DC

## 2015-03-15 MED ORDER — GLUCOSE BLOOD VI STRP: ORAL_STRIP | Status: DC

## 2015-03-15 MED ORDER — CARVEDILOL 6.25 MG PO TABS: 6.2500 mg | ORAL_TABLET | Freq: Two times a day (BID) | ORAL | Status: DC

## 2015-03-15 MED ORDER — BLOOD GLUCOSE MONITOR KIT: PACK | Status: DC

## 2015-03-15 MED ORDER — CLOPIDOGREL BISULFATE 75 MG PO TABS: ORAL_TABLET | ORAL | Status: DC

## 2015-03-15 MED ORDER — COLCHICINE 0.6 MG PO TABS: ORAL_TABLET | ORAL | Status: DC

## 2015-03-15 MED ORDER — GABAPENTIN 100 MG PO CAPS: ORAL_CAPSULE | ORAL | Status: DC

## 2015-03-15 MED ORDER — SIMVASTATIN 40 MG PO TABS
40.0000 mg | ORAL_TABLET | Freq: Every day | ORAL | Status: DC
Start: 2015-03-15 — End: 2015-03-15

## 2015-03-15 MED ORDER — SIMVASTATIN 40 MG PO TABS: ORAL_TABLET | ORAL | Status: DC

## 2015-03-15 NOTE — Patient Instructions (Addendum)
Can google "advance directives, [place]"  And bring up form from Secretary of [place]. Print and fill out Or can go to "5 wishes"  Which is also in Spanish and fill out--this costs $5--perhaps easier to use. Designate a Cytogeneticist to speak for you if you are unable to speak for yourself when ill or injured  Hold Simvastatin (cholesterol pill)  When you need to take Colcrys(colchicine) for gout attack  Gabapentin:  Start with 1 cap in morning and 3 caps at night.  If tolerating increased dose in morning after 3 days, increase to 2 caps in morning and stay on night dose.  If tolerating 2 caps in morning after 3 more days, increase to 3 caps in morning and 3 caps at night.   Drink a glass of water before every meal Drink 6-8 glasses of water daily Eat three meals daily Eat a protein and healthy fat with every meal (eggs,fish, chicken, [place] and limit red meats) Eat 5 servings of vegetables daily, mix the colors Eat 2 servings of fruit daily with skin, if skin is edible Use smaller plates Put food/utensils down as you chew and swallow each bite Eat at a table with friends/family at least once daily, no TV Do not eat in front of the TV

## 2015-03-15 NOTE — Progress Notes (Signed)
Subjective:    Patient ID: April Shaffer, female    DOB: 09/10/1950, 65 y.o.   MRN: [id number]  HPI  Out of all pills really, though has some and is just stretching out.  DM:  Lost weight 3 years ago and was able to decrease medication. Last A1C in November was 5.9%.  Describes losing about 60 lbs.  Apparently had help with Truluvia.  Feels she is eating in a healthy way.  Essential hypertension:  10-15 years.  Was controlled on Carvedilol and HCTZ.  Does have gout flairs  Right sided low back pain with radiculopathy--has used gabapentin for pain control as well as Tramadol, but Tramadol allergic now.   Gout:  Notes flairs more so when eats a lot more shrimp.  Has prescriptions for Allopurinol 100 mg, and for acute flairs:  Colchcine, Indocin, Naproxen and Ibuprofen, which we discussed is concerning as she is also on Clopidogrel and 81 mg of ASA daily.    Right Carotid endoarterectomy in 2013:  On Clopidogrel and ASA since.  Hyperlipidemia:  Was most recently on Crestor, but cannot afford.  Not clear what she took before that was not as effective.    Meds: 1.  Carvedilol 6.25 mg twice daily 2.  Allopurinol 100 mg daily 3.  Rosuvastatin 20 mg daily 4.  ASA 81 mg daily 5.  Clopidogrel 75 mg daily 6.  HCTZ 25 mg daily 7.  Indomethacin 50 mg twice daily as needed for pain 8.  Naproxen 500 mg twice daily as needed for pain 9.  Ibuprofen 800 mg every 8 hours as needed for pain 10. Colchicine 0.6 mg twice daily as needed for gout pain 11.  Metformin XR 750 mg once daily  12.  Benadryl 25 mg every 6 hours as needed for allergies. 13.  Gabapentin 100 mg 1cap in the morning and 3 caps at night  Allergies  Allergen Reactions  . Lisinopril-Hydrochlorothiazide Other (See Comments)    Angioedema  - Tongue swelling   . Codeine Itching  . Oxycodone   . Simvastatin Other (See Comments)    Muscle pain  . Tramadol Itching      Review of Systems     Objective:   Physical Exam    NAD HEENT:  PERRL, EOMI, Discs sharp, TMs pearly gray, throat without injection Lungs:  CTA CV:  RRR without murmur or rub, radial pulses normal and equal Extrems:  No edema        Assessment & Plan:  Multiple health concerns:  Mainly discussed how to get her medications situated today so she can afford.  1.   DM:  Switch to Metformin XR 500 mg daily and see if controls her sugars adequatley (change for cost.)  2.  Essential Hypertension:  Poorly controlled.  Restart Carvedilol first, if not at goal in follow up, consider Amlodipine.  Avoid HCTZ due to gout.  3.  Hyperlipidemia:  Unable to get Crestor any longer at [facility].  Switch to Simvastatin 40 mg daily and return for FLP in 6-8 weeks.  4.  Gout:  Discussed risks of taking multiple NSAIDS and with Plavix.  To Restart Allopurinol at 300 mg dose as currently quiescent and use Colcrys only as needed with flair, holding Simvastatin when utilizing.  Stop Ibuprofen, Indomethacin, and Naproxen.  5.  Low back pain with right radiculopathy:  Gabapentin refilled, plus to try cyclobenzaprine at bedtime for sleep/back pain  6.  History of right carotid endarterectomy:  Continue Plavix and  ASA

## 2015-03-22 ENCOUNTER — Ambulatory Visit (INDEPENDENT_AMBULATORY_CARE_PROVIDER_SITE_OTHER): Payer: Self-pay | Admitting: Internal Medicine

## 2015-03-22 VITALS — BP 196/98 | HR 78 | Resp 18 | Ht 63.5 in | Wt 154.0 lb

## 2015-03-22 DIAGNOSIS — I10 Essential (primary) hypertension: Secondary | ICD-10-CM

## 2015-04-15 ENCOUNTER — Ambulatory Visit (INDEPENDENT_AMBULATORY_CARE_PROVIDER_SITE_OTHER): Payer: Self-pay | Admitting: Internal Medicine

## 2015-04-15 ENCOUNTER — Encounter: Payer: Self-pay | Admitting: Internal Medicine

## 2015-04-15 VITALS — BP 142/90 | HR 68 | Resp 18 | Wt 152.0 lb

## 2015-04-15 DIAGNOSIS — B353 Tinea pedis: Secondary | ICD-10-CM

## 2015-04-15 DIAGNOSIS — E785 Hyperlipidemia, unspecified: Secondary | ICD-10-CM

## 2015-04-15 DIAGNOSIS — I10 Essential (primary) hypertension: Secondary | ICD-10-CM

## 2015-04-15 DIAGNOSIS — E1142 Type 2 diabetes mellitus with diabetic polyneuropathy: Secondary | ICD-10-CM

## 2015-04-15 DIAGNOSIS — S025XXA Fracture of tooth (traumatic), initial encounter for closed fracture: Secondary | ICD-10-CM

## 2015-04-15 LAB — GLUCOSE, POCT (MANUAL RESULT ENTRY): POC GLUCOSE: 76 mg/dL (ref 70–99)

## 2015-04-15 MED ORDER — ATORVASTATIN CALCIUM 40 MG PO TABS: 40.0000 mg | ORAL_TABLET | Freq: Every day | ORAL | Status: DC

## 2015-04-15 MED ORDER — AMLODIPINE BESYLATE 2.5 MG PO TABS
2.5000 mg | ORAL_TABLET | Freq: Every day | ORAL | Status: DC
Start: 2015-04-15 — End: 2016-01-31

## 2015-04-15 NOTE — Patient Instructions (Addendum)
Stop Simvastatin and start Atorvastatin Hold cholesterol medicine when you need to take Colcrys for gout Start Amlodipine for blood pressure Terbinafine antifungal cream twice daily to feet before vaseline for 14 days or until flaking is gone. Increase Gabapentin morning dose as tolerated for diabetic foot pain  1-800QUITNOW for more information regarding smoking cessation.

## 2015-04-15 NOTE — Progress Notes (Signed)
Subjective:    Patient ID: April Shaffer, female    DOB: 04-26-1950, 65 y.o.   MRN: [id number]  HPI  1.  Hyperlipidemia:  Pt. On Simvastatin and having muscle pain issues.  She has been on it for a month since last visit. Has been on 40 mg of Lipitor or Atorvastatin in past, but was switched to Crestor due to cholesterol not at goal with Atorvastatin.  Cannot get Crestor due to cost at this time--no longer carried by [facility] pharmacy.    Did meet cholesterol goal with Crestor.   2.  DM:  Fasting sugars have been 80-90s.  Today, fasting 76.  Gets similar readings before dinner in the evening. Up to date with influenza, pneumococcal vaccine, 13 and 23 valent, as well as Tdap.   Checks feet nightly Lot of dead skin on left foot--would like to have me look at it today. Still with burning in feet with current dose of gabapentin. Has eye appointment through [facility] --waiting to hear where it will be. Does have mild cataracts.  3.  Hypertension:  BP much better today.  Has had angioedema with ACE I in past.  Has not been on Amlodipine based on old med list.  4.  Needs dental appt. For broken partial and care of native teeth.   Meds: 1.  Allopurinol 300 mg daily 2.  Carvedilol 6.25 mg twice daily 3.  ASA 81 mg daily 4.  Clopidogrel 75 mg daily 5.  Diphenhydramine 25 mg every 6 hours as needed 6.  Gabapentin 100 mg, 1 cap in morning and 3 caps at bedtime, to titrate up morning dose to 300 mg as needed for foot pain 7.  Metformin XR 500 mg 1 tab in morning with breakfast 8.  Simvastatin 40 mg daily Colchicine 0.6 mg twice daily as needed for gout--currently does not have.  Knows to hold statin when needs to take colcrys 9.  Diclofenac gel--unable to obtain--too expensive 10.  Cyclobenzaprine 10 mg at bedtime as needed for back pain    Review of Systems     Objective:   Physical Exam  NAD Lungs:  CTA CV:  RRR with normal S1 and S2, No S3, S4, or murmur appreciated.  Radial pulses  normal and equal.   LE:  Bilateral LE with significant varicosities and broken spider veins of feet. Feet with mild cyanosis.  Flaking of left foot with mild callousing at heel edge.  Thick flap of scaling with dime sized calloused hardened area at ball of foot.  Left great nail with mild thickening and minimal flaking at distal edge. Skin otherwise healthy. DP and PT pulses decreased. 10 g monofilament testing normal bilaterally     Assessment & Plan:  1.  Hyperlipidemia:  Not tolerating Simvastatin. Switch to Atorvastatin and checking fasting labs in 6 weeks.  2.  Essential Hypertension:  Improved, but still high.  Resting HR in 60s.  Hold on increasing Carvedilol.  Add low dose Amlodipine 2.5 mg daily  3. Tinea pedis and likely mild toenail onychomycosis:  Will use topical Terbinafine twice daily for 14 days for now.  4.  Diabetic peripheral neuropathy:  Encouraged patient to gradually increase Gabapentin dose in the morning to max of 300 mg if foot discomfort not controlled.  Will switch to 300 mg caps if reaches 300 mg with morning dose.  5.  Broken partial:  Referral to dental clinic.  6.  DM  II: Seems well controlled per patient history.  Check A1C with fasting lipid level in 6 weeks.  7.  Smoking Cessation:  Pt. Continues to smoke only in the evening 3-4 cigarettes with an alcoholic beverage.  Discussed how the smoking worsens her risk factor for PVD, MI, Stroke, etc.  Gave information on [id number] and recommended nicotine gum for the evening.

## 2015-04-17 ENCOUNTER — Encounter: Payer: Self-pay | Admitting: Internal Medicine

## 2015-05-22 ENCOUNTER — Encounter: Payer: Self-pay | Admitting: Internal Medicine

## 2015-05-27 ENCOUNTER — Other Ambulatory Visit: Payer: Self-pay

## 2015-05-31 ENCOUNTER — Ambulatory Visit (INDEPENDENT_AMBULATORY_CARE_PROVIDER_SITE_OTHER): Payer: Self-pay

## 2015-05-31 DIAGNOSIS — Z79899 Other long term (current) drug therapy: Secondary | ICD-10-CM

## 2015-05-31 DIAGNOSIS — E1142 Type 2 diabetes mellitus with diabetic polyneuropathy: Secondary | ICD-10-CM

## 2015-05-31 DIAGNOSIS — E785 Hyperlipidemia, unspecified: Secondary | ICD-10-CM

## 2015-06-01 LAB — COMPREHENSIVE METABOLIC PANEL
ALBUMIN: 4.2 g/dL (ref 3.6–4.8)
ALT: 11 IU/L (ref 0–32)
AST: 19 IU/L (ref 0–40)
Albumin/Globulin Ratio: 1.3 (ref 1.2–2.2)
Alkaline Phosphatase: 111 IU/L (ref 39–117)
BUN / CREAT RATIO: 20 (ref 12–28)
BUN: 17 mg/dL (ref 8–27)
Bilirubin Total: 0.6 mg/dL (ref 0.0–1.2)
CALCIUM: 9 mg/dL (ref 8.7–10.3)
CO2: 20 mmol/L (ref 18–29)
CREATININE: 0.86 mg/dL (ref 0.57–1.00)
Chloride: 98 mmol/L (ref 96–106)
GFR, EST AFRICAN AMERICAN: 84 mL/min/{1.73_m2} (ref >59)
GFR, EST NON AFRICAN AMERICAN: 73 mL/min/{1.73_m2} (ref >59)
GLOBULIN, TOTAL: 3.2 g/dL (ref 1.5–4.5)
Glucose: 100 mg/dL — ABNORMAL HIGH (ref 65–99)
Potassium: 4 mmol/L (ref 3.5–5.2)
SODIUM: 149 mmol/L — AB (ref 134–144)
TOTAL PROTEIN: 7.4 g/dL (ref 6.0–8.5)

## 2015-06-01 LAB — LIPID PANEL W/O CHOL/HDL RATIO
CHOLESTEROL TOTAL: 149 mg/dL (ref 100–199)
HDL: 61 mg/dL (ref >39)
LDL Calculated: 67 mg/dL (ref 0–99)
Triglycerides: 106 mg/dL (ref 0–149)
VLDL Cholesterol Cal: 21 mg/dL (ref 5–40)

## 2015-06-01 LAB — MICROALBUMIN, URINE: MICROALBUM., U, RANDOM: 250.7 ug/mL

## 2015-06-01 LAB — HGB A1C W/O EAG: HEMOGLOBIN A1C: 6.2 % — AB (ref 4.8–5.6)

## 2015-06-03 ENCOUNTER — Ambulatory Visit (INDEPENDENT_AMBULATORY_CARE_PROVIDER_SITE_OTHER): Payer: Self-pay | Admitting: Internal Medicine

## 2015-06-03 ENCOUNTER — Encounter: Payer: Self-pay | Admitting: Internal Medicine

## 2015-06-03 VITALS — BP 148/88 | HR 60 | Resp 18 | Ht 63.5 in | Wt 155.5 lb

## 2015-06-03 DIAGNOSIS — M1A09X0 Idiopathic chronic gout, multiple sites, without tophus (tophi): Secondary | ICD-10-CM

## 2015-06-03 DIAGNOSIS — E1142 Type 2 diabetes mellitus with diabetic polyneuropathy: Secondary | ICD-10-CM

## 2015-06-03 DIAGNOSIS — I8393 Asymptomatic varicose veins of bilateral lower extremities: Secondary | ICD-10-CM

## 2015-06-03 DIAGNOSIS — I10 Essential (primary) hypertension: Secondary | ICD-10-CM

## 2015-06-03 DIAGNOSIS — I8390 Asymptomatic varicose veins of unspecified lower extremity: Secondary | ICD-10-CM | POA: Insufficient documentation

## 2015-06-03 MED ORDER — METFORMIN HCL ER 500 MG PO TB24: ORAL_TABLET | ORAL | Status: DC

## 2015-06-03 MED ORDER — GABAPENTIN 100 MG PO CAPS: ORAL_CAPSULE | ORAL | Status: DC

## 2015-06-03 NOTE — Progress Notes (Signed)
Subjective:    Patient ID: April Shaffer, female    DOB: Mar 25, 1950, 65 y.o.   MRN: [id number]  HPI   1.  DM II:  A1C up to 6.2%.  She feels she is doing all she can with diet.  She is trying to be physically active.  Her right ankle is more painful and limiting her ability to get around.  She is only taking 500 mg of XR Metformin daily.  Has not tried to sign on at [facility] with scholarship Had eye exam and getting glasses. Had dental exam and working on treatment plan   2.  Essential Hypertension:  Was running late and rushing to get here this morning.  Also, did not take her medication this morning.  3.  Peripheral Neuropathy:  Taking 3 caps twice daily. Having aching in her right ankle and toes.  Wears thin  Bedroom slippers at home.  Interested in increasing the Gabapentin for a midday dose as well.  4.  Hyperlipidemia:  At goal with FLP just returned.   Current outpatient prescriptions:  .  allopurinol (ZYLOPRIM) 300 MG tablet, Take 1 tablet (300 mg total) by mouth daily., Disp: 30 tablet, Rfl: 11 .  amLODipine (NORVASC) 2.5 MG tablet, Take 1 tablet (2.5 mg total) by mouth daily., Disp: 30 tablet, Rfl: 11 .  aspirin 81 MG tablet, Take 1 tablet (81 mg total) by mouth daily., Disp: 30 tablet, Rfl:  .  atorvastatin (LIPITOR) 40 MG tablet, Take 1 tablet (40 mg total) by mouth daily., Disp: 30 tablet, Rfl: 11 .  blood glucose meter kit and supplies KIT, Check sugars twice daily, Disp: 1 each, Rfl: 0 .  carvedilol (COREG) 6.25 MG tablet, Take 1 tablet (6.25 mg total) by mouth 2 (two) times daily with a meal., Disp: 60 tablet, Rfl: 11 .  clopidogrel (PLAVIX) 75 MG tablet, TAKE ONE TABLET BY MOUTH ONCE DAILY WITH BREAKFAST, Disp: 30 tablet, Rfl: 11 .  diphenhydrAMINE (BENADRYL) 25 MG tablet, Take 25 mg by mouth every 6 (six) hours as needed. For allergies, Disp: , Rfl:  .  gabapentin (NEURONTIN) 100 MG capsule, 3 caps by mouth in morning and evening.  Start 1 cap in afternoon and increase by  1 cap every 3 days as tolerated to 3 caps in afternoon max, Disp: 280 capsule, Rfl: 11 .  glucose blood test strip, Check sugars twice daily, Disp: 100 each, Rfl: 11 .  metFORMIN (GLUCOPHAGE-XR) 500 MG 24 hr tablet, 1 tab by mouth twice daily with meals, Disp: 60 tablet, Rfl: 11 .  colchicine 0.6 MG tablet, 1 tab by mouth twice daily only when needed for gouty attack (Patient not taking: Reported on 04/15/2015), Disp: 60 tablet, Rfl: 1 .  cyclobenzaprine (FLEXERIL) 10 MG tablet, 1 tab at night as needed for back pain (Patient not taking: Reported on 06/03/2015), Disp: 30 tablet, Rfl: 1  Allergies  Allergen Reactions  . Lisinopril-Hydrochlorothiazide Other (See Comments)    Angioedema  - Tongue swelling   . Codeine Itching  . Oxycodone   . Simvastatin Other (See Comments)    Muscle pain  . Tramadol Itching   Social History   Social History  . Marital Status: Divorced    Spouse Name: N/A  . Number of Children: 2  . Years of Education: N/A   Occupational History  . 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 Smoking status: Light Tobacco Smoker -- 0.25 packs/day for  25 years    Types: Cigarettes    Start date: 05/31/1968  . Smokeless tobacco: Never Used     Comment: pt states she smokes about 3-4 cigs per --states she contiues to work on it.  No interest in other help now.  . Alcohol Use: 1.2 oz/week    2 Shots of liquor per week     Comment: 2 drinks a day  . Drug Use: No  . Sexual Activity: Not on file   Other Topics Concern  . Not on file   Social History Narrative   Regular exercise- only at work, walking   Diet- diabetic diet   Lives with long term boyfriend (20+ years together)   Both sons live in [place]    Review of Systems     Objective:   Physical Exam   Lungs:  CTA CV:  RRR without murmur or rub, radial pulses normal and equal LE:  No edema, significant varicosities of legs with broken superficial veins of  feet.  No edema. Feet with good care. 1/2 cm callous over plantar head of Left 2nd metatarsal.  Not thick Hammertoes 2nd 3rd toes bilaterally--mild. Flaking better on left foot also Right medial malleolus with some swelling and mild redness, tender in same area. Nontender over anterior joint of right ankle.  Full ROM      Assessment & Plan:  1.  DM:  Not as good control, though still good.  Increase Metformin to XR 500 mg twice daily.  Recheck A1C in 3 months.  2.  Essential Hypertension:  BP up a bit, but has not take bp meds, including Carvedilol today.  To call ahead and stop by in next 2 weeks for bp check when she is not in a hurry.  3.  Right Ankle Pain:  Possibly related to Varicosities, but gout involvement possible.  She has not filled out the paperwork for [facility] and Colcrys.  Discussed getting that done soon.  To try Tylenol 1000 mg twice daily. With the pain, recommended [employer] membership and use of stationary bike and walking laps in pool.    4.  Hyperlipidemia:  Controlled  5.  Peripheral Neuropathy:  Still a concern.  Continue morning and evening dose.  Gradually increase to 300 mg of Gabapentin in midday dosing.  Switch to 300 mg caps when taking same dose 3 times daily.

## 2015-06-03 NOTE — Patient Instructions (Signed)
Take Tylenol 1000 mg twice daily for ankle pain  [facility]

## 2015-06-07 ENCOUNTER — Telehealth: Payer: Self-pay | Admitting: Family Medicine

## 2015-06-07 ENCOUNTER — Other Ambulatory Visit: Payer: 59

## 2015-06-07 DIAGNOSIS — E785 Hyperlipidemia, unspecified: Secondary | ICD-10-CM

## 2015-06-07 DIAGNOSIS — E1169 Type 2 diabetes mellitus with other specified complication: Secondary | ICD-10-CM

## 2015-06-07 DIAGNOSIS — E1142 Type 2 diabetes mellitus with diabetic polyneuropathy: Secondary | ICD-10-CM

## 2015-06-07 NOTE — Telephone Encounter (Signed)
-----   Message from Baldomero LamyNatasha C Chavers sent at 05/31/2015  8:36 AM EDT ----- Regarding: 6 mo f/u labs Tues 4/11, need orders. Thanks! :-) Please order future 6 mo f/u labs for pt's upcoming lab appt. Thanks Rodney Boozeasha

## 2015-06-07 NOTE — Telephone Encounter (Signed)
Pt does not appear to need labs .Marland Kitchen. Looks like she had with Dr. Alden BenjaminMullberry in 05/2105. Call pt to cancel lab visit.

## 2015-06-14 ENCOUNTER — Ambulatory Visit: Payer: 59 | Admitting: Family Medicine

## 2015-06-29 ENCOUNTER — Telehealth: Payer: Self-pay | Admitting: Internal Medicine

## 2015-06-29 DIAGNOSIS — M1A0710 Idiopathic chronic gout, right ankle and foot, without tophus (tophi): Secondary | ICD-10-CM

## 2015-06-29 MED ORDER — COLCHICINE 0.6 MG PO TABS: ORAL_TABLET | ORAL | Status: DC

## 2015-07-01 ENCOUNTER — Ambulatory Visit (INDEPENDENT_AMBULATORY_CARE_PROVIDER_SITE_OTHER): Payer: Self-pay | Admitting: Internal Medicine

## 2015-07-01 ENCOUNTER — Encounter: Payer: Self-pay | Admitting: Internal Medicine

## 2015-07-01 VITALS — BP 140/80 | HR 83 | Ht 63.5 in | Wt 155.5 lb

## 2015-07-01 DIAGNOSIS — E1142 Type 2 diabetes mellitus with diabetic polyneuropathy: Secondary | ICD-10-CM

## 2015-07-01 DIAGNOSIS — M1A0710 Idiopathic chronic gout, right ankle and foot, without tophus (tophi): Secondary | ICD-10-CM

## 2015-07-01 DIAGNOSIS — J301 Allergic rhinitis due to pollen: Secondary | ICD-10-CM

## 2015-07-01 DIAGNOSIS — I10 Essential (primary) hypertension: Secondary | ICD-10-CM

## 2015-07-01 MED ORDER — ALLOPURINOL 300 MG PO TABS: 300.0000 mg | ORAL_TABLET | Freq: Every day | ORAL | Status: DC

## 2015-07-01 MED ORDER — FLUTICASONE PROPIONATE 50 MCG/ACT NA SUSP: 2.0000 | Freq: Every day | NASAL | Status: DC

## 2015-07-01 MED ORDER — GABAPENTIN 300 MG PO CAPS
ORAL_CAPSULE | ORAL | Status: DC
Start: 2015-07-01 — End: 2016-01-31

## 2015-07-01 MED ORDER — CETIRIZINE HCL 10 MG PO TABS: 10.0000 mg | ORAL_TABLET | Freq: Every day | ORAL | Status: DC

## 2015-07-01 MED ORDER — CARVEDILOL 6.25 MG PO TABS: 6.2500 mg | ORAL_TABLET | Freq: Two times a day (BID) | ORAL | Status: DC

## 2015-07-01 MED ORDER — METFORMIN HCL ER 500 MG PO TB24: ORAL_TABLET | ORAL | Status: DC

## 2015-07-01 NOTE — Patient Instructions (Signed)
If you can't get Zyrtec at [facility] pharmacy today, then get 7 days worth or less [facility] and then go back next week for the prescription If you dont note any improvement, get Fluticasone nasal Spray over the counter and add to the Zyrtec.

## 2015-07-01 NOTE — Telephone Encounter (Signed)
Patient aware.

## 2015-07-01 NOTE — Progress Notes (Signed)
Subjective:    Patient ID: April Shaffer, female    DOB: 03/28/1950, 65 y.o.   MRN: [id number]  HPI   Past 2 days, nasal congestion,sneezing,  posterior pharyngeal drainage. Eyes itchy, but not really watering.   Cough seems to come from throat.  Gets hoarse intermittently.  Feels like she can't get up mucous from her throat in the morning. 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  Has pollen all over her car.  Today with wind--has really set her coughing off. Tried some allergy pill to take every 6 hours. Non drowsy medication.   When coughs a lot her chest hurts.  No fever.  Mucous is white yellow.   Stopped smoking when developed cough.  Meds at end of visit:    Current outpatient prescriptions:  .  amLODipine (NORVASC) 2.5 MG tablet, Take 1 tablet (2.5 mg total) by mouth daily., Disp: 30 tablet, Rfl: 11 .  aspirin 81 MG tablet, Take 1 tablet (81 mg total) by mouth daily., Disp: 30 tablet, Rfl:  .  blood glucose meter kit and supplies KIT, Check sugars twice daily, Disp: 1 each, Rfl: 0 .  diphenhydrAMINE (BENADRYL) 25 MG tablet, Take 25 mg by mouth every 6 (six) hours as needed. For allergies, Disp: , Rfl:  .  gabapentin (NEURONTIN) 300 MG capsule, 1 cap by mouth three times daily, Disp: 90 capsule, Rfl: 11 .  glucose blood test strip, Check sugars twice daily, Disp: 100 each, Rfl: 11 .  metFORMIN (GLUCOPHAGE-XR) 500 MG 24 hr tablet, 1 tab by mouth twice daily with meals, Disp: 60 tablet, Rfl: 11 .  allopurinol (ZYLOPRIM) 300 MG tablet, Take 1 tablet (300 mg total) by mouth daily., Disp: 30 tablet, Rfl: 11 .  atorvastatin (LIPITOR) 40 MG tablet, Take 1 tablet (40 mg total) by mouth daily., Disp: 30 tablet, Rfl: 11 .  carvedilol (COREG) 6.25 MG tablet, Take 1 tablet (6.25 mg total) by mouth 2 (two) times daily with a meal., Disp: 60 tablet, Rfl: 11 .  cetirizine (ZYRTEC) 10 MG tablet, Take 1 tablet (10 mg total) by mouth daily., Disp: 30 tablet, Rfl: 6 .   clopidogrel (PLAVIX) 75 MG tablet, TAKE ONE TABLET BY MOUTH ONCE DAILY WITH BREAKFAST, Disp: 30 tablet, Rfl: 11 .  colchicine 0.6 MG tablet, 1 tab by mouth twice daily only when needed for gouty attack, Disp: 60 tablet, Rfl: 4 .  fluticasone (FLONASE) 50 MCG/ACT nasal spray, Place 2 sprays into both nostrils daily., Disp: 16 g, Rfl: 0   Allergies  Allergen Reactions  . Lisinopril-Hydrochlorothiazide Other (See Comments)    Angioedema  - Tongue swelling   . Codeine Itching  . Oxycodone   . Simvastatin Other (See Comments)    Muscle pain  . Tramadol Itching    Review of Systems     Objective:   Physical Exam HEENT:  PERRL, EOMI, conjunctivae with mild injection, no watering,  Nasal mucosa swollen and boggy, TMs pearly gray, posterior pharynx with cobbling. Mild tenderness over maxillary sinuses Neck:  Supple, no adenopathy Chest:  CTA CV:  RRR without murmur or rub, radial pulses normal and equal       Assessment & Plan:  1.  Seasonal Allergies:  Start Cetirizine 10 mg daily.  If not controlling her Sx, to add Fluticasone nasal spray, 2 sprays each nostril daily.   Discussed meds from [facility] would not likely get to her in time to control spring symptoms.  [facility] and increased Metformin to twice daily as we discussed at last visit. Is taking 300 mg three times daily of Gabapentin and doing well with it, so changed to 300 mg caps.

## 2015-09-02 ENCOUNTER — Ambulatory Visit: Payer: No Typology Code available for payment source | Admitting: Internal Medicine

## 2015-09-02 ENCOUNTER — Other Ambulatory Visit (INDEPENDENT_AMBULATORY_CARE_PROVIDER_SITE_OTHER): Payer: Self-pay

## 2015-09-02 VITALS — BP 132/70 | HR 76

## 2015-09-02 DIAGNOSIS — I10 Essential (primary) hypertension: Secondary | ICD-10-CM

## 2015-09-02 DIAGNOSIS — E1142 Type 2 diabetes mellitus with diabetic polyneuropathy: Secondary | ICD-10-CM

## 2015-09-03 LAB — HGB A1C W/O EAG: Hgb A1c MFr Bld: 6.2 % — ABNORMAL HIGH (ref 4.8–5.6)

## 2015-09-27 ENCOUNTER — Ambulatory Visit (INDEPENDENT_AMBULATORY_CARE_PROVIDER_SITE_OTHER): Payer: Self-pay | Admitting: Internal Medicine

## 2015-09-27 ENCOUNTER — Encounter: Payer: Self-pay | Admitting: Internal Medicine

## 2015-09-27 VITALS — BP 138/70 | HR 70 | Resp 16 | Ht 63.5 in | Wt 160.0 lb

## 2015-09-27 DIAGNOSIS — E1169 Type 2 diabetes mellitus with other specified complication: Secondary | ICD-10-CM

## 2015-09-27 DIAGNOSIS — I6523 Occlusion and stenosis of bilateral carotid arteries: Secondary | ICD-10-CM

## 2015-09-27 DIAGNOSIS — M1A09X0 Idiopathic chronic gout, multiple sites, without tophus (tophi): Secondary | ICD-10-CM

## 2015-09-27 DIAGNOSIS — M5416 Radiculopathy, lumbar region: Secondary | ICD-10-CM

## 2015-09-27 DIAGNOSIS — E1142 Type 2 diabetes mellitus with diabetic polyneuropathy: Secondary | ICD-10-CM

## 2015-09-27 DIAGNOSIS — E785 Hyperlipidemia, unspecified: Secondary | ICD-10-CM

## 2015-09-27 DIAGNOSIS — I2510 Atherosclerotic heart disease of native coronary artery without angina pectoris: Secondary | ICD-10-CM

## 2015-09-27 LAB — GLUCOSE, POCT (MANUAL RESULT ENTRY): POC Glucose: 133 mg/dl — AB (ref 70–99)

## 2015-09-27 MED ORDER — ACETAMINOPHEN 500 MG PO TABS: ORAL_TABLET | ORAL | 0 refills | Status: DC

## 2015-09-27 NOTE — Progress Notes (Signed)
Subjective:    Patient ID: April Shaffer, female    DOB: 1950-05-05, 65 y.o.   MRN: [id number]  HPI   1.  Seasonal Allergies:  No longer with symptoms.  Sounds like she actually received Clarinex from [facility], and not Zyrtec, but then states has Zyrtec as well.  Did not bring her meds today.  [facility] pharmacy states she has been picking up a prescription for Clarinex.  2. DM Type 2:  Sugars generally in low 100s, both fasting and in afternoon before evening meal.   Pt.'s recent A1C beginning of month was 6.2% Checking feet nightly Last diabetic foot check in 11/2014 Encouraged influenza vaccine in October  Up to date with Pneumococcal vaccines. Had eye check in [place].  No diabetic changes. Does have small cataracts. Pt. States she has been taking 300 mg of Gabapentin three times daily, though the pharmacy stated she has missed picking up as well.  3.  Hyperlipidemia:  Recent lipids excellent  4.  Dental:  Had teeth cleaned. 1 tooth pulled.  Needs to get partial as the clinic does not help with those.  She has some leads for low cost  5.  Essential Hypertension:  Taking meds, no problems.    6.  Right Low back pain which is now radiating to right anterior thigh and keeps her up at night and also when walking.  Some numbness and tingling when bad. .  No weakness in leg.  No loss of control of bowels or bladder.  7.  Carotid Artery Disease:  Viewed her old records with [facility].  Does not appear she has been back since 03/12/2014.  Due to her occluded small right internal carotid artery (found during CEA in 2013) and 70% stenosis of left ICA, she has been maintained on double antiplatelet therapy with aspirin and Clopidogrel and carotid dopplers followed every 6 months until her last visit. Was not clear today with what she is actually taking. Stated she was not taking the Clopidogrel despite having a prescription at the [facility].  Called pharmacy and had not picked up  medication since January.  She did pick up after her appt. Today.   Current Meds  Medication Sig  . acetaminophen (TYLENOL) 500 MG tablet 2 tabs by mouth twice daily for back and leg pain  . allopurinol (ZYLOPRIM) 300 MG tablet Take 1 tablet (300 mg total) by mouth daily.  Marland Kitchen amLODipine (NORVASC) 2.5 MG tablet Take 1 tablet (2.5 mg total) by mouth daily.  Marland Kitchen aspirin 81 MG tablet Take 1 tablet (81 mg total) by mouth daily.  Marland Kitchen atorvastatin (LIPITOR) 40 MG tablet Take 1 tablet (40 mg total) by mouth daily.  . blood glucose meter kit and supplies KIT Check sugars twice daily  . carvedilol (COREG) 6.25 MG tablet Take 1 tablet (6.25 mg total) by mouth 2 (two) times daily with a meal.  . desloratadine (CLARINEX) 5 MG tablet Take 5 mg by mouth daily.  . diphenhydrAMINE (BENADRYL) 25 MG tablet Take 25 mg by mouth every 6 (six) hours as needed. For allergies  . gabapentin (NEURONTIN) 300 MG capsule 1 cap by mouth three times daily  . glucose blood test strip Check sugars twice daily  . metFORMIN (GLUCOPHAGE-XR) 500 MG 24 hr tablet 1 tab by mouth twice daily with meals    Allergies  Allergen Reactions  . Lisinopril-Hydrochlorothiazide Other (See Comments)    Angioedema  - Tongue swelling   . Codeine Itching  . Oxycodone  Itching  . Simvastatin Other (See Comments)    Muscle pain  . Tramadol Itching      Review of Systems     Objective:   Physical Exam   NAD Neck: Supple, No adenopathy LUngs:  CTA CV:   RRR with normal S1 and S2, No S3, S4 or murmur, No carotid bruit, Carotid, radial pulses normal and equal.  Decreased DP and PT pulses bilaterally Abd:  S, NT, No HSM or masses, + BS Back:  NT over spinous processes, LS spine, mild tenderness of right paraspinous musculature Neuro:  LE:  Motor 5/5 throughout, DTRs 2+/4 throughout, sensory to light touch normal. Feet with decreased DP and PT pulses, spider veins with some bluish discoloration with feet dependent  Normal 10 g mono Diabetic  Foot Exam - Simple   Simple Foot Form Diabetic Foot exam was performed with the following findings:  Yes 09/27/2015 11:00 AM  Visual Inspection Sensation Testing Pulse Check Comments Spider veins  With bluish discoloration of feet after feet dependent for a time.         Assessment & Plan:  1.  Allergies:  Clarinex as needed.  2.  DM Type 2:   A1C in July at 6.2%, showing excellent control. Pt. To continue Gabapentin for peripheral neuropathy.  Flu vaccine in Sept/Oct Diabetic foot exam shows probable PAD, though asymptomatic.  3.  Hyperlipidemia:  Well controlled on 40 mg of Atorvastatin daily (half of an 80 mg tab)  4.  Right Low Back Pain with radicular symptoms:  Strongly urged patient to not take NSAID with her antiplatelet therapy--Tylenol.  If does not control, will need to consider topical Diclofenac, but not sure she will be able to afford.  Call if continues to be a problem  5.  Cerebrovascular Disease/Carotid artery disease:  Need to get pt. Back to surveillance with [facility].  Strongly urged to quit smoking completely and would be willing to help her with this, though she is not interested at this time. She is controlling other risk factors: bp, DM, hyperlipidemia, however.  6.  Essential Hypertension:  Controlled  7.  Gout:  Prevention with Allopurinol.  Colchicine only for flares.  Hold Atorvastatin for flares  Follow up in 4 months

## 2015-09-29 ENCOUNTER — Telehealth: Payer: Self-pay | Admitting: Internal Medicine

## 2015-09-29 NOTE — Telephone Encounter (Signed)
[facility] Department Pharmacist called and would like to know if Dr. Delrae Alfred wants to start patient on 300 mg. Of gabapentin 300 mg since patient has been off Rx for a while.  Would she prefer to start on 100 mg?

## 2015-09-30 NOTE — Telephone Encounter (Signed)
Do you want to do anything different than what you prescribed?

## 2015-10-11 ENCOUNTER — Telehealth: Payer: Self-pay | Admitting: Internal Medicine

## 2015-10-11 NOTE — Telephone Encounter (Signed)
Patient needs refill of Zyloprim 300 mg.  Patient was paying $4.00 and Rx is going up to $9.00.  Patient wants Dr. Delrae AlfredMulberry to see where she can call in Rx at a lower rate.  Patient can be reached at [phone].

## 2015-10-11 NOTE — Telephone Encounter (Signed)
Patient is taking the 300mg  and has not been off the medication.

## 2015-10-11 NOTE — Telephone Encounter (Signed)
The medication is 4 dollars at [facility]. I have resent it there.

## 2015-10-18 ENCOUNTER — Telehealth: Payer: Self-pay | Admitting: Internal Medicine

## 2015-10-18 ENCOUNTER — Encounter: Payer: Self-pay | Admitting: Internal Medicine

## 2015-10-18 NOTE — Addendum Note (Signed)
Addended by: Marcene DuosMULBERRY, Kruz Chiu M on: 10/18/2015 09:53 AM   Modules accepted: Orders

## 2015-10-19 NOTE — Telephone Encounter (Signed)
Message was relayed to patient.

## 2015-10-21 NOTE — Telephone Encounter (Signed)
Awaiting a return call from [facility] referral specialist.

## 2015-10-21 NOTE — Telephone Encounter (Signed)
[facility] referral specialist, Juliette AlcideMelinda, is going to contact the patient directly and get her scheduled with another provider there that isn't Dr. Hart RochesterLawson because he is retiring.

## 2015-10-25 NOTE — Telephone Encounter (Signed)
So, need to know if they are going to honor orange card as in previous note or if they will decrease cost somehow.

## 2015-10-26 NOTE — Telephone Encounter (Signed)
April Shaffer, can you explain how this will work for April Shaffer having only the orange card. Will she have to pay anything that day, will she have to complete the [facility] financial assistance application or will the provider there [facility]?

## 2015-11-07 ENCOUNTER — Telehealth: Payer: Self-pay | Admitting: Internal Medicine

## 2015-11-07 DIAGNOSIS — M1A0710 Idiopathic chronic gout, right ankle and foot, without tophus (tophi): Secondary | ICD-10-CM

## 2015-11-07 MED ORDER — ALLOPURINOL 300 MG PO TABS: 300.0000 mg | ORAL_TABLET | Freq: Every day | ORAL | 11 refills | Status: DC

## 2015-11-07 NOTE — Telephone Encounter (Signed)
Rx sent to [facility] on [address].

## 2015-11-07 NOTE — Telephone Encounter (Signed)
Patient needs refill of Rx allopurinol(Zyloprim 300 mg).  She can not get refill at $4.00 rate will now be $9.00.  Patient would like to know if Dr. Delrae AlfredMulberry can place her on another Rx of lessor cost.  Patient can be reached [phone] or [phone].

## 2015-11-29 ENCOUNTER — Other Ambulatory Visit: Payer: Self-pay | Admitting: Vascular Surgery

## 2015-11-29 DIAGNOSIS — I6523 Occlusion and stenosis of bilateral carotid arteries: Secondary | ICD-10-CM

## 2015-12-02 ENCOUNTER — Encounter: Payer: Self-pay | Admitting: Vascular Surgery

## 2015-12-05 ENCOUNTER — Telehealth: Payer: Self-pay | Admitting: Internal Medicine

## 2015-12-05 NOTE — Telephone Encounter (Signed)
Patient stopped by office would like Dr. Delrae AlfredMulberry to set up an appointment for mammogram as well as dental referral for a partial.  Patient was told she would need a referral for dental partial by Cly[facility]is [facility]

## 2015-12-07 ENCOUNTER — Ambulatory Visit (INDEPENDENT_AMBULATORY_CARE_PROVIDER_SITE_OTHER): Payer: No Typology Code available for payment source | Admitting: Vascular Surgery

## 2015-12-07 ENCOUNTER — Ambulatory Visit (HOSPITAL_COMMUNITY)
Admission: RE | Admit: 2015-12-07 | Discharge: 2015-12-07 | Disposition: A | Discharge status: Home / Self Care | Payer: No Typology Code available for payment source | Source: Ambulatory Visit | Attending: Vascular Surgery | Admitting: Vascular Surgery

## 2015-12-07 ENCOUNTER — Encounter: Payer: Self-pay | Admitting: Vascular Surgery

## 2015-12-07 VITALS — BP 131/71 | HR 72 | Temp 97.4°F | Resp 20 | Ht 64.5 in | Wt 167.3 lb

## 2015-12-07 DIAGNOSIS — I6521 Occlusion and stenosis of right carotid artery: Secondary | ICD-10-CM

## 2015-12-07 DIAGNOSIS — I6523 Occlusion and stenosis of bilateral carotid arteries: Secondary | ICD-10-CM | POA: Insufficient documentation

## 2015-12-07 DIAGNOSIS — Z48812 Encounter for surgical aftercare following surgery on the circulatory system: Secondary | ICD-10-CM | POA: Insufficient documentation

## 2015-12-07 LAB — VAS US CAROTID
LCCADDIAS: -40 cm/s
LCCAPSYS: 117 cm/s
LEFT ECA DIAS: -97 cm/s
LEFT VERTEBRAL DIAS: 28 cm/s
LICADDIAS: -39 cm/s
LICADSYS: -130 cm/s
LICAPSYS: -393 cm/s
Left CCA dist sys: -111 cm/s
Left CCA prox dias: 31 cm/s
Left ICA prox dias: -105 cm/s
RCCAPSYS: 87 cm/s
RIGHT CCA MID DIAS: 17 cm/s
RIGHT ECA DIAS: -19 cm/s
RIGHT VERTEBRAL DIAS: 19 cm/s
Right CCA prox dias: 11 cm/s

## 2015-12-07 NOTE — Addendum Note (Signed)
Addended by: Yolonda KidaEVANS, ASHLEIGH N on: 12/07/2015 01:40 PM   Modules accepted: Orders

## 2015-12-07 NOTE — Progress Notes (Signed)
Patient ID: April Shaffer, female   DOB: September 02, 1950, 65 y.o.   MRN: [id number]  Reason for Consult: Carotid (follow up carotid stenosis)   Referred by Mack Hook, MD  Subjective:     HPI:  April Shaffer is a 65 y.o. female with a history of a right carotid endarterectomy that has subsequently occluded without symptoms. She has never had a stroke or mini stroke. She does not have any neurologic sequela from either her previous surgery or from the occluded carotid. She is able to ambulate without issue. She does have varicose veins having bilateral greater saphenous vein ablations at a local facility years ago. She does continue to smoke but has severely cut back and has gone a few days without smoking recently. She continues on aspirin and Plavix and a statin drug.  Past Medical History:  Diagnosis Date  . Arthritis   . Carotid artery narrowing   . Coronary artery disease    Cardiac catheterization November 2013: 50% ostial LAD stenosis 50% mid stenosis. 30% disease in the left circumflex.  . Diabetes mellitus, type 2 ([place])   . Hyperlipidemia   . Hypertension   . Sleep apnea    hx had tonsilectomy and no longer has    Family History  Problem Relation Age of Onset  . Hypertension Sister   . Hypothyroidism Sister   . Cancer Maternal Grandmother     lung   Past Surgical History:  Procedure Laterality Date  . ABDOMINAL HYSTERECTOMY  2000   total  . CARDIAC CATHETERIZATION    . CAROTID ENDARTERECTOMY Right 09-27-11   cea  . ENDARTERECTOMY  09/27/2011   Procedure: ENDARTERECTOMY CAROTID;  Surgeon: Mal Misty, MD;  Location: [place];  Service: Vascular;  Laterality: Right;  . EPIDURAL BLOCK INJECTION  02/2008   Drs. Eulis Manly  . gyn surgery  2004   total hysterectomy for mennorhagia,,salpingoophorectomy  . TONSILLECTOMY    . VARICOSE VEIN SURGERY  2008   stripping    Short Social History:  Social History  Substance Use Topics  . Smoking status:  Light Tobacco Smoker    Packs/day: 0.25    Years: 25.00    Types: Cigarettes    Start date: 05/31/1968  . Smokeless tobacco: Never Used     Comment: pt states she smokes about 3-4 cigs per --states she contiues to work on it.  No interest in other help now.  . Alcohol use 1.2 oz/week    2 Shots of liquor per week     Comment: 2 drinks a day    Allergies  Allergen Reactions  . Lisinopril-Hydrochlorothiazide Other (See Comments)    Angioedema  - Tongue swelling   . Codeine Itching  . Oxycodone Itching  . Simvastatin Other (See Comments)    Muscle pain  . Tramadol Itching    Current Outpatient Prescriptions  Medication Sig Dispense Refill  . acetaminophen (TYLENOL) 500 MG tablet 2 tabs by mouth twice daily for back and leg pain 30 tablet 0  . allopurinol (ZYLOPRIM) 300 MG tablet Take 1 tablet (300 mg total) by mouth daily. 30 tablet 11  . amLODipine (NORVASC) 2.5 MG tablet Take 1 tablet (2.5 mg total) by mouth daily. 30 tablet 11  . aspirin 81 MG tablet Take 1 tablet (81 mg total) by mouth daily. 30 tablet   . atorvastatin (LIPITOR) 40 MG tablet Take 1 tablet (40 mg total) by mouth daily. 30 tablet 11  . blood glucose meter kit  and supplies KIT Check sugars twice daily 1 each 0  . carvedilol (COREG) 6.25 MG tablet Take 1 tablet (6.25 mg total) by mouth 2 (two) times daily with a meal. 60 tablet 11  . clopidogrel (PLAVIX) 75 MG tablet TAKE ONE TABLET BY MOUTH ONCE DAILY WITH BREAKFAST 30 tablet 11  . colchicine 0.6 MG tablet 1 tab by mouth twice daily only when needed for gouty attack 60 tablet 4  . desloratadine (CLARINEX) 5 MG tablet Take 5 mg by mouth daily.    . diphenhydrAMINE (BENADRYL) 25 MG tablet Take 25 mg by mouth every 6 (six) hours as needed. For allergies    . gabapentin (NEURONTIN) 300 MG capsule 1 cap by mouth three times daily 90 capsule 11  . glucose blood test strip Check sugars twice daily 100 each 11  . metFORMIN (GLUCOPHAGE-XR) 500 MG 24 hr tablet 1 tab by mouth  twice daily with meals 60 tablet 11   No current facility-administered medications for this visit.     Review of Systems  Constitutional:  Constitutional negative. HENT: HENT negative.  Eyes: Eyes negative.  Respiratory: Respiratory negative.  Cardiovascular: Cardiovascular negative.  GI: Gastrointestinal negative.  Musculoskeletal: Positive for back pain.  Skin: Skin negative.  Neurological: Neurological negative. Hematologic: Hematologic/lymphatic negative.  Psychiatric: Psychiatric negative.        Objective:  Objective   Vitals:   12/07/15 1159 12/07/15 1200 12/07/15 1206  BP: (!) 153/80 (!) 154/74 131/71  Pulse: 72    Resp: 20    Temp: 97.4 F (36.3 C)    TempSrc: Oral    SpO2: 99%    Weight: 167 lb 4.8 oz (75.9 kg)    Height: 5' 4.5" (1.638 m)     Body mass index is 28.27 kg/m.  Physical Exam  Constitutional: She is oriented to person, place, and time. She appears well-developed.  HENT:  Head: Atraumatic.  Eyes: EOM are normal.  Neck: Normal range of motion.  Cardiovascular: Normal rate.   Pulses:      Radial pulses are 2+ on the right side, and 2+ on the left side.       Femoral pulses are 2+ on the right side, and 2+ on the left side. Pulmonary/Chest: Effort normal.  Abdominal: Soft. She exhibits no mass.  Musculoskeletal: Normal range of motion. She exhibits no edema.  Neurological: She is alert and oriented to person, place, and time. No cranial nerve deficit.  Skin: Skin is warm and dry.  Psychiatric: She has a normal mood and affect. Her behavior is normal. Judgment and thought content normal.    Data: Carotid duplex today demonstrates 60-79% left ICA stenosis with an occluded right ICA. Her ICA proximally is 377 with EDV 98 heterogeneous plaque. The ICA/CCA ratio is 3.6 antegrade vertebral flow bilaterally.     Assessment/Plan:   65 year old African female history of right carotid endarterectomy that has subsequently occluded without sequela.  She is cutting back on smoking taking aspirin and Plavix and a statin. She does have moderate internal carotid artery stenosis by duplex today but with velocity and ratio approximating 80% with an occluded contralateral side confusing the picture. Because of this I will get a CT angiogram of her neck and head which can be used for comparison to her duplex. She will follow up in a month or so to discuss the results. We also discussed possible venous reflux studies for her recurrent varicose veins in the future.     Waynetta Sandy  MD [facility]

## 2015-12-07 NOTE — Progress Notes (Signed)
Vitals:   12/07/15 1159 12/07/15 1206  BP: (!) 153/80 131/71  Pulse: 72   Resp: 20   Temp: 97.4 F (36.3 C)   TempSrc: Oral   SpO2: 99%   Weight: 167 lb 4.8 oz (75.9 kg)   Height: 5' 4.5" (1.638 m)

## 2016-01-03 ENCOUNTER — Other Ambulatory Visit: Payer: Self-pay | Admitting: Internal Medicine

## 2016-01-03 DIAGNOSIS — Z1239 Encounter for other screening for malignant neoplasm of breast: Secondary | ICD-10-CM

## 2016-01-03 DIAGNOSIS — K08139 Complete loss of teeth due to caries, unspecified class: Secondary | ICD-10-CM

## 2016-01-03 NOTE — Telephone Encounter (Signed)
Order for mammogram in She needs to come in and get scholarship form filled out.  Referral to dental for partial also--please let her know.

## 2016-01-03 NOTE — Progress Notes (Signed)
Requesting order for Mammogram and referral for dental partial.

## 2016-01-24 ENCOUNTER — Telehealth (HOSPITAL_COMMUNITY): Payer: Self-pay | Admitting: *Deleted

## 2016-01-24 NOTE — Telephone Encounter (Signed)
Attempted to call patient to schedule appointment with [facility]. No one answered phone. Left voicemail for patient to call back. 

## 2016-01-31 ENCOUNTER — Ambulatory Visit (INDEPENDENT_AMBULATORY_CARE_PROVIDER_SITE_OTHER): Payer: No Typology Code available for payment source | Admitting: Internal Medicine

## 2016-01-31 ENCOUNTER — Encounter: Payer: Self-pay | Admitting: Internal Medicine

## 2016-01-31 VITALS — BP 140/88 | HR 70 | Resp 12 | Ht 61.0 in | Wt 166.0 lb

## 2016-01-31 DIAGNOSIS — K08109 Complete loss of teeth, unspecified cause, unspecified class: Secondary | ICD-10-CM

## 2016-01-31 DIAGNOSIS — Z79899 Other long term (current) drug therapy: Secondary | ICD-10-CM

## 2016-01-31 DIAGNOSIS — E782 Mixed hyperlipidemia: Secondary | ICD-10-CM

## 2016-01-31 DIAGNOSIS — E1142 Type 2 diabetes mellitus with diabetic polyneuropathy: Secondary | ICD-10-CM

## 2016-01-31 DIAGNOSIS — E010 Iodine-deficiency related diffuse (endemic) goiter: Secondary | ICD-10-CM

## 2016-01-31 DIAGNOSIS — I739 Peripheral vascular disease, unspecified: Secondary | ICD-10-CM

## 2016-01-31 DIAGNOSIS — Z0000 Encounter for general adult medical examination without abnormal findings: Secondary | ICD-10-CM

## 2016-01-31 DIAGNOSIS — I10 Essential (primary) hypertension: Secondary | ICD-10-CM

## 2016-01-31 DIAGNOSIS — I779 Disorder of arteries and arterioles, unspecified: Secondary | ICD-10-CM

## 2016-01-31 DIAGNOSIS — Z23 Encounter for immunization: Secondary | ICD-10-CM

## 2016-01-31 DIAGNOSIS — M1A0710 Idiopathic chronic gout, right ankle and foot, without tophus (tophi): Secondary | ICD-10-CM

## 2016-01-31 LAB — GLUCOSE, POCT (MANUAL RESULT ENTRY): POC GLUCOSE: 122 mg/dL — AB (ref 70–99)

## 2016-01-31 MED ORDER — CLOPIDOGREL BISULFATE 75 MG PO TABS: ORAL_TABLET | ORAL | 11 refills | Status: DC

## 2016-01-31 MED ORDER — AMLODIPINE BESYLATE 2.5 MG PO TABS: 2.5000 mg | ORAL_TABLET | Freq: Every day | ORAL | 11 refills | Status: DC

## 2016-01-31 MED ORDER — ATORVASTATIN CALCIUM 40 MG PO TABS: 40.0000 mg | ORAL_TABLET | Freq: Every day | ORAL | 11 refills | Status: DC

## 2016-01-31 MED ORDER — GABAPENTIN 300 MG PO CAPS: ORAL_CAPSULE | ORAL | 11 refills | Status: DC

## 2016-01-31 MED ORDER — GLUCOSE BLOOD VI STRP
ORAL_STRIP | 11 refills | Status: AC
Start: 2016-01-31 — End: ?

## 2016-01-31 MED ORDER — METFORMIN HCL ER 500 MG PO TB24: ORAL_TABLET | ORAL | 11 refills | Status: DC

## 2016-01-31 MED ORDER — CARVEDILOL PHOSPHATE ER 10 MG PO CP24: 10.0000 mg | ORAL_CAPSULE | Freq: Every day | ORAL | 11 refills | Status: DC

## 2016-01-31 MED ORDER — ALLOPURINOL 300 MG PO TABS: 300.0000 mg | ORAL_TABLET | Freq: Every day | ORAL | 11 refills | Status: DC

## 2016-01-31 MED ORDER — DESLORATADINE 5 MG PO TABS: 5.0000 mg | ORAL_TABLET | Freq: Every day | ORAL | 11 refills | Status: DC

## 2016-01-31 NOTE — Progress Notes (Signed)
Subjective:    Patient ID: April Shaffer, female    DOB: 17-Jan-1951, 65 y.o.   MRN: [id number]  HPI   CPE without pap  1.  Pap:  Normal in past, but is status post TAH and BSO  2.  Mammogram:  Phone tag with mammogram office--order has been placed.  Filled out scholarship previously.  3.  Osteoprevention:  Little in way of dairy intake.  No calcium or Vitamin D.  DEXA  12/2013, hip T score was -0.5, normal.  Wrist was higher.  Spine not checked due to degenerative changes.  4.  Guaiac Cards:  5.  Colonoscopy:  2009-hyperplastic polyp.  [place] next to [employer].  Not Dr. Collene Mares.  Due 2019.  6.  Immunizations: Needs influenza  Current Meds  Medication Sig  . acetaminophen (TYLENOL) 500 MG tablet 2 tabs by mouth twice daily for back and leg pain  . allopurinol (ZYLOPRIM) 300 MG tablet Take 1 tablet (300 mg total) by mouth daily.  Marland Kitchen amLODipine (NORVASC) 2.5 MG tablet Take 1 tablet (2.5 mg total) by mouth daily.  Marland Kitchen aspirin 81 MG tablet Take 1 tablet (81 mg total) by mouth daily.  Marland Kitchen atorvastatin (LIPITOR) 40 MG tablet Take 1 tablet (40 mg total) by mouth daily. (Patient taking differently: Take 40 mg by mouth daily. 1/2 tablet daily)  . blood glucose meter kit and supplies KIT Check sugars twice daily  . carvedilol (COREG) 6.25 MG tablet Take 1 tablet (6.25 mg total) by mouth 2 (two) times daily with a meal.  . clopidogrel (PLAVIX) 75 MG tablet TAKE ONE TABLET BY MOUTH ONCE DAILY WITH BREAKFAST  . colchicine 0.6 MG tablet 1 tab by mouth twice daily only when needed for gouty attack  . desloratadine (CLARINEX) 5 MG tablet Take 5 mg by mouth daily.  Marland Kitchen gabapentin (NEURONTIN) 300 MG capsule 1 cap by mouth three times daily  . glucose blood test strip Check sugars twice daily  . metFORMIN (GLUCOPHAGE-XR) 500 MG 24 hr tablet 1 tab by mouth twice daily with meals   Allergies  Allergen Reactions  . Lisinopril-Hydrochlorothiazide Other (See Comments)    Angioedema  - Tongue  swelling   . Codeine Itching  . Oxycodone Itching  . Simvastatin Other (See Comments)    Muscle pain  . Tramadol Itching   Past Medical History:  Diagnosis Date  . Arthritis   . Carotid artery narrowing   . Coronary artery disease    Cardiac catheterization November 2013: 50% ostial LAD stenosis 50% mid stenosis. 30% disease in the left circumflex.  . Diabetes mellitus, type 2 ([place])   . Hyperlipidemia   . Hypertension   . Sleep apnea    hx had tonsilectomy and no longer has     Past Surgical History:  Procedure Laterality Date  . ABDOMINAL HYSTERECTOMY  2000   total  . CARDIAC CATHETERIZATION    . CAROTID ENDARTERECTOMY Right 09-27-11   cea  . ENDARTERECTOMY  09/27/2011   Procedure: RIGT ENDARTERECTOMY CAROTID;  Surgeon: Mal Misty, MD;  Location: [place];  Service: Vascular;  Laterality: Right;  . EPIDURAL BLOCK INJECTION  02/2008   Drs. Eulis Manly  . gyn surgery  2004   total hysterectomy for mennorhagia,,salpingoophorectomy  . TONSILLECTOMY    . VARICOSE VEIN SURGERY  2008   stripping   Family History  Problem Relation Age of Onset  . Hypertension Sister   . Hypothyroidism Sister   . Cancer Maternal Grandmother  lung  . Hypertension Sister   . Hypertension Son    Social History   Social History  . Marital status: Divorced    Spouse name: N/A  . Number of children: 2  . Years of education: 12   Occupational History  . 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 Smoking status: Light Tobacco Smoker    Packs/day: 0.25    Years: 25.00    Types: Cigarettes    Start date: 05/31/1968  . Smokeless tobacco: Never Used     Comment: pt states she smokes about 3-4 cigs per --states she contiues to work on it.  No interest in other help now.  . Alcohol use 1.2 oz/week    2 Shots of liquor per week     Comment: 2 drinks a day  . Drug use: No  . Sexual activity: Not on file   Other Topics Concern  . Not  on file   Social History Narrative   Regular exercise, walking   Diet-    Lives with long term boyfriend (20+ years together)   Both sons live in [place]      Review of Systems  Constitutional: Negative for activity change, appetite change and fatigue.  HENT: Positive for dental problem. Negative for postnasal drip and sore throat.        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   Cardiovascular: Negative for chest pain, palpitations and leg swelling.  Gastrointestinal: Negative for abdominal pain, blood in stool, constipation, diarrhea, nausea and vomiting.       No melena  Endocrine: Positive for cold intolerance and polydipsia. Negative for polyuria.  Genitourinary: Negative for dysuria, vaginal discharge and vaginal pain.  Musculoskeletal: Positive for arthralgias.       Mainly of hands--comes and goes.  No swelling, redness.  Skin: Positive for rash.       No lesions with growth or concerning change  Neurological: Negative for dizziness, speech difficulty and light-headedness.       Numbness and tingling of hands.  Most significant at night.  Maybe 2 years.  Seems to be worsening.  Psychiatric/Behavioral: Negative for dysphoric mood and suicidal ideas. The patient is not nervous/anxious.        Objective:   Physical Exam  Constitutional: She is oriented to person, place, and time. She appears well-developed and well-nourished.  HENT:  Head: Normocephalic and atraumatic.  Right Ear: Hearing, tympanic membrane, external ear and ear canal normal.  Left Ear: Hearing, tympanic membrane, external ear and ear canal normal.  Nose: Nose normal.  Mouth/Throat: Oropharynx is clear and moist and  mucous membranes are normal. No oropharyngeal exudate.  Multiple missing teeth and gingival recession  Eyes: Conjunctivae and EOM are normal. Pupils are equal, round, and reactive to light.  Unable to see discs due to small pupils and cataracts  Neck: Normal range of motion. Neck supple. No JVD present. Carotid bruit is not present. Thyromegaly present. No thyroid mass present.  Right CEA surgical scar, right anterior cervical area of neck  Cardiovascular: Normal rate, regular rhythm, S1 normal and S2 normal.  Exam reveals no S3, no  S4 and no friction rub.   No murmur heard. Pulses:      Carotid pulses are 2+ on the right side, and 2+ on the left side.      Radial pulses are 2+ on the right side, and 2+ on the left side.       Femoral pulses are 1+ on the right side, and 1+ on the left side.      Dorsalis pedis pulses are 1+ on the right side, and 1+ on the left side.       Posterior tibial pulses are 1+ on the right side, and 1+ on the left side.  Mild to moderate varicosities of bilateral lower legs with spider veining prominent in feet. Feet cool  Pulmonary/Chest: Effort normal and breath sounds normal. Right breast exhibits no inverted nipple, no mass, no nipple discharge, no skin change and no tenderness. Left breast exhibits no inverted nipple, no mass, no nipple discharge, no skin change and no tenderness.  Breasts large and pendulous  Abdominal: Soft. Bowel sounds are normal. She exhibits no mass. There is no hepatosplenomegaly. There is no tenderness. No hernia.  Genitourinary: Rectum normal. Rectal exam shows no mass and guaiac negative stool.  Musculoskeletal: Normal range of motion.  Lymphadenopathy:       Head (right side): No submental and no submandibular adenopathy present.       Head (left side): No submental and no submandibular adenopathy present.    She has no axillary adenopathy.       Right: No supraclavicular adenopathy present.       Left: No supraclavicular  adenopathy present.  Neurological: She is alert and oriented to person, place, and time. She has normal strength and normal reflexes. No cranial nerve deficit or sensory deficit. Coordination and gait normal.  Skin: Skin is warm.  Left great toenail with darkening on medial margin, flaking and thickening  Psychiatric: She has a normal mood and affect. Her speech is normal and behavior is normal. Judgment and thought content normal. Cognition and memory are normal.          Assessment & Plan:  1.  CPE without pap Mammogram already ordered.  Patient in process of trying to [facility] with [facility] Colonoscopy due in 2019 Influenza vaccine given   2.  Thyromegaly with cold intolerance:  TSH  3.  DM:  A1C, urine microalbumin/Crea, CMP,  4.  Hyperlipidemia:  FLP, CMP  5.  CAD/Essential Hypertension:  EKG, change Carvedilol to extended release.  To call for nurse visit after change to CR version--1 month after starts.  Stable  6.  Dental:  Needs referral for partial.

## 2016-01-31 NOTE — Patient Instructions (Signed)
Call if you are unable to get Mammogram scheduled

## 2016-02-01 LAB — COMPREHENSIVE METABOLIC PANEL
ALK PHOS: 112 IU/L (ref 39–117)
ALT: 17 IU/L (ref 0–32)
AST: 21 IU/L (ref 0–40)
Albumin/Globulin Ratio: 1.3 (ref 1.2–2.2)
Albumin: 4.4 g/dL (ref 3.6–4.8)
BUN/Creatinine Ratio: 20 (ref 12–28)
BUN: 17 mg/dL (ref 8–27)
Bilirubin Total: 0.5 mg/dL (ref 0.0–1.2)
CO2: 23 mmol/L (ref 18–29)
CREATININE: 0.86 mg/dL (ref 0.57–1.00)
Calcium: 9 mg/dL (ref 8.7–10.3)
Chloride: 104 mmol/L (ref 96–106)
GFR calc Af Amer: 83 mL/min/{1.73_m2} (ref >59)
GFR calc non Af Amer: 72 mL/min/{1.73_m2} (ref >59)
GLUCOSE: 105 mg/dL — AB (ref 65–99)
Globulin, Total: 3.4 g/dL (ref 1.5–4.5)
Potassium: 4.3 mmol/L (ref 3.5–5.2)
SODIUM: 142 mmol/L (ref 134–144)
Total Protein: 7.8 g/dL (ref 6.0–8.5)

## 2016-02-01 LAB — LIPID PANEL W/O CHOL/HDL RATIO
Cholesterol, Total: 169 mg/dL (ref 100–199)
HDL: 45 mg/dL (ref >39)
LDL CALC: 94 mg/dL (ref 0–99)
Triglycerides: 152 mg/dL — ABNORMAL HIGH (ref 0–149)
VLDL CHOLESTEROL CAL: 30 mg/dL (ref 5–40)

## 2016-02-01 LAB — TSH: TSH: 3.93 u[IU]/mL (ref 0.450–4.500)

## 2016-02-01 LAB — MICROALBUMIN / CREATININE URINE RATIO
Creatinine, Urine: 67.2 mg/dL
Microalb/Creat Ratio: 82.7 mg/g creat — ABNORMAL HIGH (ref 0.0–30.0)
Microalbumin, Urine: 55.6 ug/mL

## 2016-02-01 LAB — HGB A1C W/O EAG: HEMOGLOBIN A1C: 6.4 % — AB (ref 4.8–5.6)

## 2016-02-01 NOTE — Progress Notes (Signed)
Left detailed message for patient on voicemail. Informed to call to set up lab appointment for 4 months.

## 2016-02-02 ENCOUNTER — Other Ambulatory Visit: Payer: Self-pay | Admitting: Obstetrics and Gynecology

## 2016-02-02 DIAGNOSIS — Z1231 Encounter for screening mammogram for malignant neoplasm of breast: Secondary | ICD-10-CM

## 2016-02-06 ENCOUNTER — Ambulatory Visit
Admission: RE | Admit: 2016-02-06 | Discharge: 2016-02-06 | Disposition: A | Discharge status: Home / Self Care | Payer: No Typology Code available for payment source | Source: Ambulatory Visit | Attending: Vascular Surgery | Admitting: Vascular Surgery

## 2016-02-06 DIAGNOSIS — I6521 Occlusion and stenosis of right carotid artery: Secondary | ICD-10-CM

## 2016-02-06 MED ORDER — IOPAMIDOL (ISOVUE-370) INJECTION 76%
150.0000 mL | Freq: Once | INTRAVENOUS | Status: AC | PRN
  Administered 2016-02-06: 150 mL via INTRAVENOUS

## 2016-02-10 ENCOUNTER — Ambulatory Visit: Payer: No Typology Code available for payment source | Admitting: Vascular Surgery

## 2016-02-13 ENCOUNTER — Other Ambulatory Visit (INDEPENDENT_AMBULATORY_CARE_PROVIDER_SITE_OTHER): Payer: Self-pay

## 2016-02-13 DIAGNOSIS — Z1211 Encounter for screening for malignant neoplasm of colon: Secondary | ICD-10-CM

## 2016-02-13 LAB — POC HEMOCCULT BLD/STL (HOME/3-CARD/SCREEN)
Card #3 Fecal Occult Blood, POC: NEGATIVE
FECAL OCCULT BLD: NEGATIVE
FECAL OCCULT BLD: NEGATIVE

## 2016-02-15 NOTE — Progress Notes (Signed)
Spoke with patient.  Results given.

## 2016-02-16 ENCOUNTER — Encounter: Payer: Self-pay | Admitting: Vascular Surgery

## 2016-02-24 ENCOUNTER — Ambulatory Visit (HOSPITAL_COMMUNITY): Payer: No Typology Code available for payment source

## 2016-02-24 ENCOUNTER — Ambulatory Visit (INDEPENDENT_AMBULATORY_CARE_PROVIDER_SITE_OTHER): Payer: No Typology Code available for payment source | Admitting: Vascular Surgery

## 2016-02-24 ENCOUNTER — Encounter: Payer: Self-pay | Admitting: Vascular Surgery

## 2016-02-24 ENCOUNTER — Ambulatory Visit
Admission: RE | Admit: 2016-02-24 | Discharge: 2016-02-24 | Disposition: A | Discharge status: Home / Self Care | Payer: No Typology Code available for payment source | Source: Ambulatory Visit | Attending: Obstetrics and Gynecology | Admitting: Obstetrics and Gynecology

## 2016-02-24 ENCOUNTER — Other Ambulatory Visit: Payer: Self-pay

## 2016-02-24 VITALS — BP 161/81 | HR 91 | Temp 97.3°F | Resp 16 | Ht 64.5 in | Wt 165.0 lb

## 2016-02-24 DIAGNOSIS — I6529 Occlusion and stenosis of unspecified carotid artery: Secondary | ICD-10-CM

## 2016-02-24 DIAGNOSIS — Z1231 Encounter for screening mammogram for malignant neoplasm of breast: Secondary | ICD-10-CM

## 2016-02-24 DIAGNOSIS — I6521 Occlusion and stenosis of right carotid artery: Secondary | ICD-10-CM

## 2016-02-24 NOTE — Progress Notes (Signed)
Patient ID: April Shaffer, female   DOB: [birth year], 65 y.o.   MRN: [id number]  Reason for Consult: Carotid (2 mth fu )   Referred by Mack Hook, MD  Subjective:     HPI:  April Shaffer is a 65 y.o. female with history of a right carotid endarterectomyh make that subsequently occlude without neurologic sequela. She last saw me in October with duplex demonstrating a near 80% stenosis of her left carotid with the right side occlusion confusing the picture. She therefore is here today with CT angiogram of her head and neck in follow-up. She has not had a stroke TIA or amaurosis. She does take aspirin and Plavix as well as Lipitor. She does continue to smoke but has quit in the past.  Past Medical History:  Diagnosis Date  . Arthritis   . Carotid artery narrowing   . Coronary artery disease    Cardiac catheterization November 2013: 50% ostial LAD stenosis 50% mid stenosis. 30% disease in the left circumflex.  . Diabetes mellitus, type 2 ([place])   . Hyperlipidemia   . Hypertension   . Sleep apnea    hx had tonsilectomy and no longer has    Family History  Problem Relation Age of Onset  . Hypertension Sister   . Hypothyroidism Sister   . Cancer Maternal Grandmother     lung  . Hypertension Sister   . Hypertension Son    Past Surgical History:  Procedure Laterality Date  . ABDOMINAL HYSTERECTOMY  2000   total  . CARDIAC CATHETERIZATION    . CAROTID ENDARTERECTOMY Right 09-27-11   cea  . ENDARTERECTOMY  09/27/2011   Procedure: RIGT ENDARTERECTOMY CAROTID;  Surgeon: Mal Misty, MD;  Location: [place];  Service: Vascular;  Laterality: Right;  . EPIDURAL BLOCK INJECTION  02/2008   Drs. Eulis Manly  . gyn surgery  2004   total hysterectomy for mennorhagia,,salpingoophorectomy  . TONSILLECTOMY    . VARICOSE VEIN SURGERY  2008   stripping    Short Social History:  Social History  Substance Use Topics  . Smoking status: Light Tobacco Smoker    Packs/day:  0.25    Years: 25.00    Types: Cigarettes    Start date: 05/31/1968  . Smokeless tobacco: Never Used     Comment: pt states she smokes about 3-4 cigs per --states she contiues to work on it.  No interest in other help now.  . Alcohol use 1.2 oz/week    2 Shots of liquor per week     Comment: 2 drinks a day    Allergies  Allergen Reactions  . Lisinopril-Hydrochlorothiazide Other (See Comments)    Angioedema  - Tongue swelling   . Codeine Itching  . Oxycodone Itching  . Simvastatin Other (See Comments)    Muscle pain  . Tramadol Itching    Current Outpatient Prescriptions  Medication Sig Dispense Refill  . acetaminophen (TYLENOL) 500 MG tablet 2 tabs by mouth twice daily for back and leg pain 30 tablet 0  . allopurinol (ZYLOPRIM) 300 MG tablet Take 1 tablet (300 mg total) by mouth daily. 30 tablet 11  . amLODipine (NORVASC) 2.5 MG tablet Take 1 tablet (2.5 mg total) by mouth daily. 30 tablet 11  . aspirin 81 MG tablet Take 1 tablet (81 mg total) by mouth daily. 30 tablet   . atorvastatin (LIPITOR) 40 MG tablet Take 1 tablet (40 mg total) by mouth daily. 1/2 tablet daily 15 tablet 11  .  carvedilol (COREG CR) 10 MG 24 hr capsule Take 1 capsule (10 mg total) by mouth daily. 30 capsule 11  . clopidogrel (PLAVIX) 75 MG tablet TAKE ONE TABLET BY MOUTH ONCE DAILY WITH BREAKFAST 30 tablet 11  . colchicine 0.6 MG tablet 1 tab by mouth twice daily only when needed for gouty attack 60 tablet 4  . desloratadine (CLARINEX) 5 MG tablet Take 1 tablet (5 mg total) by mouth daily. 30 tablet 11  . diphenhydrAMINE (BENADRYL) 25 MG tablet Take 25 mg by mouth every 6 (six) hours as needed. For allergies    . gabapentin (NEURONTIN) 300 MG capsule 1 cap by mouth three times daily 90 capsule 11  . glucose blood test strip Check sugars twice daily 100 each 11  . metFORMIN (GLUCOPHAGE-XR) 500 MG 24 hr tablet 1 tab by mouth twice daily with meals 60 tablet 11  . blood glucose meter kit and supplies KIT Check  sugars twice daily (Patient not taking: Reported on 02/24/2016) 1 each 0   No current facility-administered medications for this visit.     Review of Systems  Constitutional:  Constitutional negative. HENT: HENT negative.  Eyes: Eyes negative.  Respiratory: Respiratory negative.  Cardiovascular: Cardiovascular negative.  GI: Gastrointestinal negative.  Musculoskeletal: Musculoskeletal negative.  Skin: Skin negative.  Neurological: Neurological negative. Hematologic: Hematologic/lymphatic negative.  Psychiatric: Psychiatric negative.        Objective:  Objective   Vitals:   02/24/16 0936 02/24/16 0937  BP: (!) 161/82 (!) 161/81  Pulse: 91   Resp: 16   Temp: 97.3 F (36.3 C)   TempSrc: Oral   SpO2: 99%   Weight: 165 lb (74.8 kg)   Height: 5' 4.5" (1.638 m)    Body mass index is 27.88 kg/m.  Physical Exam  Constitutional: She is oriented to person, place, and time. She appears well-developed.  Neck: Neck supple.  Cardiovascular: Normal rate.   Pulmonary/Chest: Effort normal.  Abdominal: She exhibits no mass.  Musculoskeletal: Normal range of motion.  Lymphadenopathy:    She has no cervical adenopathy.  Neurological: She is alert and oriented to person, place, and time.  Skin: Skin is warm and dry.  Psychiatric: She has a normal mood and affect. Her behavior is normal. Judgment and thought content normal.    Data: IMPRESSION: 1. 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 Intracranial reconstitution via the posterior communicating artery. 2. 75% distal left common carotid artery stenosis. 3. Severe bilateral vertebral artery origin stenoses. 4. 50% proximal left subclavian artery stenosis. 5. Mild intracranial left ICA stenosis. 6. Left thyroid goiter. 7. Aortic atherosclerosis.      Assessment/Plan:    65 year old female history of right carotid endarterectomy that has occluded without sequela. She denies CT scan demonstrating 75% distal left common carotid  artery stenosis which is consistent with her elevated velocities on previous duplex. We have counseled her on continued attempts at quitting smoking. She is also been counseled on the signs and symptoms of stroke which she demonstrates good understanding. She will have repeat carotid duplex in 6 months given the level of stenoses. Continue aspirin and Plavix and statin should she have issues between now 6 months we'll certainly see her sooner.     Waynetta Sandy MD [facility]

## 2016-03-02 NOTE — Telephone Encounter (Signed)
Pat follow up and confirmed that patient was informed to come in and get scholarship form and dental form.  Patient has already had mammogram on 12/29.

## 2016-03-29 ENCOUNTER — Telehealth: Payer: Self-pay | Admitting: Internal Medicine

## 2016-03-29 ENCOUNTER — Other Ambulatory Visit: Payer: Self-pay

## 2016-03-29 MED ORDER — ACETAMINOPHEN 500 MG PO TABS: ORAL_TABLET | ORAL | 0 refills | Status: DC

## 2016-03-29 NOTE — Telephone Encounter (Signed)
[facility]

## 2016-03-29 NOTE — Telephone Encounter (Signed)
Patient would like a ref[facility]

## 2016-04-17 ENCOUNTER — Other Ambulatory Visit: Payer: Self-pay

## 2016-04-17 MED ORDER — ATORVASTATIN CALCIUM 40 MG PO TABS: 40.0000 mg | ORAL_TABLET | Freq: Every day | ORAL | 11 refills | Status: DC

## 2016-04-18 ENCOUNTER — Telehealth: Payer: Self-pay | Admitting: Internal Medicine

## 2016-04-18 MED ORDER — ATORVASTATIN CALCIUM 40 MG PO TABS: ORAL_TABLET | ORAL | 11 refills | Status: DC

## 2016-06-04 ENCOUNTER — Other Ambulatory Visit (INDEPENDENT_AMBULATORY_CARE_PROVIDER_SITE_OTHER): Payer: Self-pay

## 2016-06-04 DIAGNOSIS — E010 Iodine-deficiency related diffuse (endemic) goiter: Secondary | ICD-10-CM

## 2016-06-05 LAB — TSH: TSH: 4.08 u[IU]/mL (ref 0.450–4.500)

## 2016-07-31 ENCOUNTER — Ambulatory Visit (INDEPENDENT_AMBULATORY_CARE_PROVIDER_SITE_OTHER): Payer: Self-pay | Admitting: Internal Medicine

## 2016-07-31 ENCOUNTER — Encounter: Payer: Self-pay | Admitting: Internal Medicine

## 2016-07-31 VITALS — BP 122/80 | HR 74 | Resp 12 | Ht 62.5 in | Wt 172.0 lb

## 2016-07-31 DIAGNOSIS — E1142 Type 2 diabetes mellitus with diabetic polyneuropathy: Secondary | ICD-10-CM

## 2016-07-31 DIAGNOSIS — B351 Tinea unguium: Secondary | ICD-10-CM | POA: Insufficient documentation

## 2016-07-31 DIAGNOSIS — E785 Hyperlipidemia, unspecified: Secondary | ICD-10-CM

## 2016-07-31 DIAGNOSIS — E1169 Type 2 diabetes mellitus with other specified complication: Secondary | ICD-10-CM

## 2016-07-31 DIAGNOSIS — I10 Essential (primary) hypertension: Secondary | ICD-10-CM

## 2016-07-31 DIAGNOSIS — M7061 Trochanteric bursitis, right hip: Secondary | ICD-10-CM

## 2016-07-31 DIAGNOSIS — I6523 Occlusion and stenosis of bilateral carotid arteries: Secondary | ICD-10-CM

## 2016-07-31 DIAGNOSIS — K029 Dental caries, unspecified: Secondary | ICD-10-CM

## 2016-07-31 HISTORY — DX: Tinea unguium: B35.1

## 2016-07-31 MED ORDER — TERBINAFINE HCL 250 MG PO TABS: 250.0000 mg | ORAL_TABLET | Freq: Every day | ORAL | 0 refills | Status: DC

## 2016-07-31 MED ORDER — ATORVASTATIN CALCIUM 40 MG PO TABS: ORAL_TABLET | ORAL | 11 refills | Status: DC

## 2016-07-31 NOTE — Progress Notes (Signed)
Subjective:    Patient ID: April Shaffer, female    DOB: Nov 06, 1950, 66 y.o.   MRN: [id number]  HPI   1.  DM:  A1C in December was 6.4%.  States her fasting sugars are always in low 100s. Had eye exam this past year-- [facility].    2.  Hyperlipidemia:  Discussed not at goal in December, but close.  Did not increase her Atorvastatin as was close.  Meant to recheck in April, but did not get ordered.  She is nonfasting today.   Admits she has not started getting physically active. Discussed finding plan B when weather is not conducive for being outside.   Does have a walking buddy.  Lipid Panel     Component Value Date/Time   CHOL 169 01/31/2016 1043   TRIG 152 (H) 01/31/2016 1043   HDL 45 01/31/2016 1043   CHOLHDL 3 12/07/2014 0806   VLDL 15.0 12/07/2014 0806   LDLCALC 94 01/31/2016 1043   LDLDIRECT 139.7 03/28/2012 1033    3.  Essential Hypertension:  Continues on long acting Carvedilol and bp improved with use.  Has gained 7 lbs, however.    4.  Health Maintenance:  Did get mammogram, normal.  Has not had a one time HIV testing.    5.  Toenail fungus:  Months.  Has been using unknown antifungal topically.  Would like to get this treated.  6.  Carotid Artery Disease:  Goes back for recheck of carotids end of June.  Followed by Dr. Donzetta Matters. Cholesterol not quite at goal in December, though would like to keep working on lifestyle changes.  She has not really worked on those lifestyle changes since her cholesterol panel results in December. Still smoking 5 cigarettes daily.  Has not tried the nicotine gum.    7.  Dental referral:  Never heard from [facility].  Referrals went out when they were closed to new referrals.  8.  CAD:  No chest pain.  9.  Right lateral thigh pain, sometimes into groin.  Tylenol helps.  Worse with more physical activity.   No calf pain with walking.  Current Meds  Medication Sig  . acetaminophen (TYLENOL) 500 MG tablet 2 tabs  by mouth twice daily for back and leg pain  . allopurinol (ZYLOPRIM) 300 MG tablet Take 1 tablet (300 mg total) by mouth daily.  Marland Kitchen amLODipine (NORVASC) 2.5 MG tablet Take 1 tablet (2.5 mg total) by mouth daily.  Marland Kitchen aspirin 81 MG tablet Take 1 tablet (81 mg total) by mouth daily.  Marland Kitchen atorvastatin (LIPITOR) 40 MG tablet 1/2 tablet by mouth daily  . blood glucose meter kit and supplies KIT Check sugars twice daily  . carvedilol (COREG CR) 10 MG 24 hr capsule Take 1 capsule (10 mg total) by mouth daily.  . clopidogrel (PLAVIX) 75 MG tablet TAKE ONE TABLET BY MOUTH ONCE DAILY WITH BREAKFAST  . colchicine 0.6 MG tablet 1 tab by mouth twice daily only when needed for gouty attack  . desloratadine (CLARINEX) 5 MG tablet Take 1 tablet (5 mg total) by mouth daily.  . diphenhydrAMINE (BENADRYL) 25 MG tablet Take 25 mg by mouth every 6 (six) hours as needed. For allergies  . gabapentin (NEURONTIN) 300 MG capsule 1 cap by mouth three times daily  . glucose blood test strip Check sugars twice daily  . metFORMIN (GLUCOPHAGE-XR) 500 MG 24 hr tablet 1 tab by mouth twice daily with meals    Allergies  Allergen Reactions  . Lisinopril-Hydrochlorothiazide Other (See Comments)    Angioedema  - Tongue swelling   . Codeine Itching  . Oxycodone Itching  . Simvastatin Other (See Comments)    Muscle pain  . Tramadol Itching    Review of Systems     Objective:   Physical Exam   NAD LUngs:  CTA CV:  RRR with normal S1 and S2, No S3, S4 or murmur.  Radial pulses normal and equal. Abd:   S, NT, No HSM or mass, + BS LE:  Varicosities bilaterally.  Decrease DP pulses bilaterally.  Tender over right greater trochanter. LEft Great toenail with thickening and dark discoloration.  Multiple small nails with similar changes.  Skin of feet healthy        Assessment & Plan:  1.  DM:  Well controlled.    2.  Hyperlipidemia with significant vascular disease:  Increase Atorvatstatin to 40 mg.  Has FLP, hepatic  profile recheck in 6 weeks.  Still need to gradually increase physical activity and continue to eat well.  3.  Carotid Artery Disease/CAD:  Continue ASA/Clopidogrel.  Strongly urged to at least switch to nicotine gum instead of smoking 5 cigs daily.  She will contemplate.  4.  Essential Hypertension:  Improved from December with change to long acting Carvedilol.  Encouraged getting the 7 additional pounds off as well.  5.  Dental:  Try referral again for cleaning as well as to see if can get partial.  6.  Right Trochanteric bursitis:  Went over stretches. Continue Tylenol as needed.  If develops more groin pain, will send for xrays of hip.  7.  Toenail onychomycosis:  Hepatic transaminases fine in December baseline.  Start 90 day course of Terbinafine.  Discussed regular spraying of shoes and cleaning of shower floor. Hepatic profile in 6 and 12 weeks with follow up.  8.  Thyroid:  Recheck TSH in 6 weeks as well.  Suspect will need supplementation of hormone in next year with gradually increasing TSH.  9.  HM:  Has had eye check.  Needs one time HIV check.  Immunizations current.

## 2016-08-10 ENCOUNTER — Encounter: Payer: Self-pay | Admitting: Vascular Surgery

## 2016-08-24 ENCOUNTER — Encounter: Payer: Self-pay | Admitting: Vascular Surgery

## 2016-08-24 ENCOUNTER — Ambulatory Visit (INDEPENDENT_AMBULATORY_CARE_PROVIDER_SITE_OTHER): Payer: No Typology Code available for payment source | Admitting: Vascular Surgery

## 2016-08-24 ENCOUNTER — Ambulatory Visit (HOSPITAL_COMMUNITY)
Admission: RE | Admit: 2016-08-24 | Discharge: 2016-08-24 | Disposition: A | Discharge status: Home / Self Care | Payer: No Typology Code available for payment source | Source: Ambulatory Visit | Attending: Vascular Surgery | Admitting: Vascular Surgery

## 2016-08-24 VITALS — BP 147/78 | HR 65 | Temp 98.0°F | Resp 16 | Ht 64.0 in | Wt 172.0 lb

## 2016-08-24 DIAGNOSIS — I6521 Occlusion and stenosis of right carotid artery: Secondary | ICD-10-CM

## 2016-08-24 DIAGNOSIS — I6523 Occlusion and stenosis of bilateral carotid arteries: Secondary | ICD-10-CM | POA: Insufficient documentation

## 2016-08-24 DIAGNOSIS — I6529 Occlusion and stenosis of unspecified carotid artery: Secondary | ICD-10-CM

## 2016-08-24 LAB — VAS US CAROTID
LCCADDIAS: -31 cm/s
LCCADSYS: -102 cm/s
LEFT ECA DIAS: -76 cm/s
LEFT VERTEBRAL DIAS: -24 cm/s
Left CCA prox dias: 30 cm/s
Left CCA prox sys: 107 cm/s
RCCAPDIAS: 3 cm/s
RIGHT CCA MID DIAS: 11 cm/s
RIGHT ECA DIAS: -17 cm/s
RIGHT VERTEBRAL DIAS: -17 cm/s
Right CCA prox sys: 61 cm/s

## 2016-08-24 NOTE — Progress Notes (Signed)
Vitals:   08/24/16 1020 08/24/16 1022  BP: (!) 160/75 (!) 153/81  Pulse: 65 65  Resp: 16   Temp: 98 F (36.7 C)   TempSrc: Oral   SpO2: 98%   Weight: 172 lb (78 kg)   Height: 5\' 4"  (1.626 m)

## 2016-08-24 NOTE — Progress Notes (Signed)
Patient ID: KAILEIA FLOW, female   DOB: 1950-05-29, 66 y.o.   MRN: [id number]  Reason for Consult: Carotid (6 month f/u)   Referred by Mack Hook, MD  Subjective:     HPI:  April Shaffer is a 66 y.o. female with a history of a right carotid endarterectomy to subsequently occluded. She has never had stroke TIA or amaurosis. She has known carotid stenosis on the left and had CT angiogram performed at her last visit demonstrated significant common carotid and bulb stenosis. She now follows up with repeat duplex ultrasound. She unfortunately continues to smoke although she states she is down to 2 cigarettes daily. She is on aspirin and a statin drug. She has had no further issues and remains at her normal level of activity at this time.  Past Medical History:  Diagnosis Date  . Arthritis   . Carotid artery narrowing   . Coronary artery disease    Cardiac catheterization November 2013: 50% ostial LAD stenosis 50% mid stenosis. 30% disease in the left circumflex.  . Diabetes mellitus, type 2 ([place])   . Hyperlipidemia   . Hypertension   . Onychomycosis of toenail 07/31/2016  . Sleep apnea    hx had tonsilectomy and no longer has    Family History  Problem Relation Age of Onset  . Hypertension Sister   . Hypothyroidism Sister   . Cancer Maternal Grandmother        lung  . Hypertension Sister   . Hypertension Son    Past Surgical History:  Procedure Laterality Date  . ABDOMINAL HYSTERECTOMY  2000   total  . CARDIAC CATHETERIZATION    . CAROTID ENDARTERECTOMY Right 09-27-11   cea  . ENDARTERECTOMY  09/27/2011   Procedure: RIGT ENDARTERECTOMY CAROTID;  Surgeon: Mal Misty, MD;  Location: [facility];  Service: Vascular;  Laterality: Right;  . EPIDURAL BLOCK INJECTION  02/2008   Drs. Eulis Manly  . gyn surgery  2004   total hysterectomy for mennorhagia,,salpingoophorectomy  . TONSILLECTOMY    . VARICOSE VEIN SURGERY  2008   stripping    Short Social History:    Social History  Substance Use Topics  . Smoking status: Light Tobacco Smoker    Packs/day: 0.25    Years: 25.00    Types: Cigarettes    Start date: 05/31/1968  . Smokeless tobacco: Never Used     Comment: pt states she smokes about 3-4 cigs per --states she contiues to work on it.  No interest in other help now.  . Alcohol use 1.2 oz/week    2 Shots of liquor per week     Comment: 2 drinks a day    Allergies  Allergen Reactions  . Lisinopril-Hydrochlorothiazide Other (See Comments)    Angioedema  - Tongue swelling   . Codeine Itching  . Oxycodone Itching  . Simvastatin Other (See Comments)    Muscle pain  . Tramadol Itching    Current Outpatient Prescriptions  Medication Sig Dispense Refill  . acetaminophen (TYLENOL) 500 MG tablet 2 tabs by mouth twice daily for back and leg pain 30 tablet 0  . allopurinol (ZYLOPRIM) 300 MG tablet Take 1 tablet (300 mg total) by mouth daily. 30 tablet 11  . amLODipine (NORVASC) 2.5 MG tablet Take 1 tablet (2.5 mg total) by mouth daily. 30 tablet 11  . aspirin 81 MG tablet Take 1 tablet (81 mg total) by mouth daily. 30 tablet   . atorvastatin (LIPITOR) 40 MG tablet  1 tab by mouth daily with evening meal 30 tablet 11  . blood glucose meter kit and supplies KIT Check sugars twice daily 1 each 0  . carvedilol (COREG CR) 10 MG 24 hr capsule Take 1 capsule (10 mg total) by mouth daily. 30 capsule 11  . clopidogrel (PLAVIX) 75 MG tablet TAKE ONE TABLET BY MOUTH ONCE DAILY WITH BREAKFAST 30 tablet 11  . colchicine 0.6 MG tablet 1 tab by mouth twice daily only when needed for gouty attack 60 tablet 4  . desloratadine (CLARINEX) 5 MG tablet Take 1 tablet (5 mg total) by mouth daily. 30 tablet 11  . diphenhydrAMINE (BENADRYL) 25 MG tablet Take 25 mg by mouth every 6 (six) hours as needed. For allergies    . gabapentin (NEURONTIN) 300 MG capsule 1 cap by mouth three times daily 90 capsule 11  . glucose blood test strip Check sugars twice daily 100 each 11   . metFORMIN (GLUCOPHAGE-XR) 500 MG 24 hr tablet 1 tab by mouth twice daily with meals 60 tablet 11  . terbinafine (LAMISIL) 250 MG tablet Take 1 tablet (250 mg total) by mouth daily. 90 tablet 0   No current facility-administered medications for this visit.     Review of Systems  Constitutional:  Constitutional negative. HENT: HENT negative.  Eyes: Eyes negative.  Respiratory: Respiratory negative.  Cardiovascular: Cardiovascular negative.  Skin: Skin negative.  Neurological: Neurological negative. Hematologic: Hematologic/lymphatic negative.  Psychiatric: Psychiatric negative.        Objective:  Objective   Vitals:   08/24/16 1020 08/24/16 1022 08/24/16 1023  BP: (!) 160/75 (!) 153/81 (!) 147/78  Pulse: 65 65 65  Resp: 16    Temp: 98 F (36.7 C)    TempSrc: Oral    SpO2: 98%    Weight: 172 lb (78 kg)    Height: _0  (1.626 m)     Body mass index is 29.52 kg/m.  Physical Exam  Constitutional: She is oriented to person, place, and time. She appears well-developed.  HENT:  Head: Normocephalic.  Eyes: Pupils are equal, round, and reactive to light.  Neck: Normal range of motion.  Cardiovascular: Normal rate.   Pulses:      Carotid pulses are 2+ on the left side.      Radial pulses are 2+ on the right side, and 2+ on the left side.       Femoral pulses are 2+ on the right side, and 2+ on the left side. Pulmonary/Chest: Effort normal.  Abdominal: Soft. She exhibits no mass.  Musculoskeletal: Normal range of motion. She exhibits no edema.  Neurological: She is alert and oriented to person, place, and time.  Skin: Skin is warm and dry.  Psychiatric: She has a normal mood and affect. Her behavior is normal. Judgment and thought content normal.    Data: I have independently interpreted her cerebrovascular duplex examination today. She has a known occlusion of the right ICA with 60-79% left ICA stenosis peak velocity 367 with EDV 97 and ratio less than 41.      Assessment/Plan:     66 year old female returns for evaluation of her carotid artery disease. She has a known right ICA occlusion following carotid endarterectomy and high-grade stenosis of the left with peak systolic velocity 161 ratio less than 4:1 and picture complicated by contralateral occlusion. Her CTA demonstrated significant calcification of both the common and the bulb. She remains asymptomatic there is no role for intervention at this time. Counseled her  on the signs and symptoms of stroke and TIA. She will continue aspirin and statin therapy. I again stressed smoking cessation she is doing her best. She will follow up in 6 months with repeat carotid duplex.     Waynetta Sandy MD [facility]

## 2016-09-05 ENCOUNTER — Other Ambulatory Visit: Payer: Self-pay

## 2016-09-05 DIAGNOSIS — M1A0710 Idiopathic chronic gout, right ankle and foot, without tophus (tophi): Secondary | ICD-10-CM

## 2016-09-05 MED ORDER — COLCHICINE 0.6 MG PO TABS: ORAL_TABLET | ORAL | 4 refills | Status: DC

## 2016-09-07 NOTE — Addendum Note (Signed)
Addended by: Burton ApleyPETTY, Manases Etchison A on: 09/07/2016 04:09 PM   Modules accepted: Orders

## 2016-09-11 ENCOUNTER — Other Ambulatory Visit (INDEPENDENT_AMBULATORY_CARE_PROVIDER_SITE_OTHER): Payer: Self-pay

## 2016-09-12 LAB — LIPID PANEL W/O CHOL/HDL RATIO
Cholesterol, Total: 155 mg/dL (ref 100–199)
HDL: 52 mg/dL (ref >39)
LDL Calculated: 90 mg/dL (ref 0–99)
TRIGLYCERIDES: 64 mg/dL (ref 0–149)
VLDL CHOLESTEROL CAL: 13 mg/dL (ref 5–40)

## 2016-09-12 LAB — TSH: TSH: 4.79 u[IU]/mL — ABNORMAL HIGH (ref 0.450–4.500)

## 2016-09-12 LAB — HIV ANTIBODY (ROUTINE TESTING W REFLEX): HIV SCREEN 4TH GENERATION: NONREACTIVE

## 2016-09-12 LAB — HEPATIC FUNCTION PANEL
ALK PHOS: 112 IU/L (ref 39–117)
ALT: 19 IU/L (ref 0–32)
AST: 24 IU/L (ref 0–40)
Albumin: 4.2 g/dL (ref 3.6–4.8)
Bilirubin Total: 0.4 mg/dL (ref 0.0–1.2)
Bilirubin, Direct: 0.12 mg/dL (ref 0.00–0.40)
TOTAL PROTEIN: 7.2 g/dL (ref 6.0–8.5)

## 2016-09-14 ENCOUNTER — Encounter: Payer: Self-pay | Admitting: Internal Medicine

## 2016-09-14 ENCOUNTER — Ambulatory Visit (INDEPENDENT_AMBULATORY_CARE_PROVIDER_SITE_OTHER): Payer: Self-pay | Admitting: Internal Medicine

## 2016-09-14 VITALS — BP 142/84 | HR 78 | Resp 12 | Ht 62.0 in | Wt 171.0 lb

## 2016-09-14 DIAGNOSIS — E1169 Type 2 diabetes mellitus with other specified complication: Secondary | ICD-10-CM

## 2016-09-14 DIAGNOSIS — I6523 Occlusion and stenosis of bilateral carotid arteries: Secondary | ICD-10-CM

## 2016-09-14 DIAGNOSIS — E785 Hyperlipidemia, unspecified: Secondary | ICD-10-CM

## 2016-09-14 DIAGNOSIS — R7989 Other specified abnormal findings of blood chemistry: Secondary | ICD-10-CM

## 2016-09-14 DIAGNOSIS — R946 Abnormal results of thyroid function studies: Secondary | ICD-10-CM

## 2016-09-14 DIAGNOSIS — I10 Essential (primary) hypertension: Secondary | ICD-10-CM

## 2016-09-14 DIAGNOSIS — M67912 Unspecified disorder of synovium and tendon, left shoulder: Secondary | ICD-10-CM

## 2016-09-14 DIAGNOSIS — E1142 Type 2 diabetes mellitus with diabetic polyneuropathy: Secondary | ICD-10-CM

## 2016-09-14 MED ORDER — CARVEDILOL PHOSPHATE ER 20 MG PO CP24: ORAL_CAPSULE | ORAL | 11 refills | Status: DC

## 2016-09-14 NOTE — Progress Notes (Signed)
Subjective:    Patient ID: April Shaffer, female    DOB: 06/05/1950, 66 y.o.   MRN: [id number]  HPI   1.  Elevated TSH:  Has been gradually increasing over past year.  TSH now slightly high.  Energy is good.  No increased hair loss, dry skin, or constipation.  Does have enlarged thyroid.   2.  Hyperlipidemia:  Improved, though not quite at goal with LDL.  States was told to only take 1/2 tab or 20 mg of Atorvastatin by pharmacy, though should be on 40 mg.  Lipid Panel     Component Value Date/Time   CHOL 155 09/11/2016 0914   TRIG 64 09/11/2016 0914   HDL 52 09/11/2016 0914   CHOLHDL 3 12/07/2014 0806   VLDL 15.0 12/07/2014 0806   LDLCALC 90 09/11/2016 0914   LDLDIRECT 139.7 03/28/2012 1033   3.  Toenail onychomycosis;  Improving with oral Terbinafine.  Having increased leg cramps, liver enzymes normal at 6 weeks.    4.  Right Trochanteric Bursitis:  Better with stretches.  Still there a bit.  Comes and goes.  5.  Carotid and Heart disease:  Recent noninvasives of left carotid arteries:  Stable narrowing.  No chest pain.  Tries to be physically active.  Planning to join [employer] next year after turns 67 and free.   6.  Dental:  Never heard anything from Dental Clinic.    7.  Left shoulder pain:  Hurts to lift things.  Has had for some time, but worse since last seen.  Using Biofreeze, which helps to certain extent.  Hard to abduct and flex shoulder at times    8.  Tobacco:  Still smoking 4 cigarettes daily.  Did not get nicotine gum.  9.  Essential Hypertension:  Taking long acting Carvedilol 10 mg.  Taking regularly.  Review of Systems     Objective:   Physical Exam  NAD Lungs:  CTA CV:  RRR without murmur or rub, radial and DP pulses normal and equal Abd:  S, NT, No HSM or mass, + BS LE:  No edema. Toenails:  Left great toenail with obvious normal nail growing from base.  Two 5th toenails not as evident with normal nail. MS:  Full ROM of left shoulder, but  pain with abduction at 90 degrees--most prominent as bring arm down.  Tender over subdeltoid bursa.  Pain with downward force on abducted internally rotated upper arm.  No swelling or erythema.         Assessment & Plan:  1.  Elevated TSH with thryomegaly:  Add free T4.  2.  Muscle cramps:  Prevent with B complex 3 times daily.  Continue mustard as needed for acute episodes  3.  Hyperlipidemia:  Spoke with pharmacy subsequent to visit.  They have filled her prescription with 80 mg tabs and she is indeed taking 40 mg daily.  Will call to have her increase to 80 mg.  4.  Toenail onychomycosis:  Improving on Terbinafine.  Liver enzymes fine.  Finish follow up in 6 weeks.  5.  Right Greater trochanteric bursitis:  Improved with stretches.  6.  CAD and Carotid Artery disease:  Stable.  7.  Dental:  Referral was sent.  Checking with dental clinic to see when she will likely be called  8.  Left shoulder rotator cuff tendinitis:  Referral to [facility]  9. Tobacco Abuse:  To try nicotine gum.  Information given.  10.  Essential Hypertension:  Increase Carvedilol to 20 mg daily.  Nurse visit in 1 week for bp and pulse check.

## 2016-09-14 NOTE — Patient Instructions (Addendum)
Vitamin B complex with B12 250 mg,B6 30 mg and riboflavin 5 mg three times daily  Tobacco Cessation:   1800QUITNOW or [id number], the former for support and possibly free nicotine patches/gum and support; the latter for [facility] Smoking cessation class. Get rid of all smoking supplies:  Cigarettes, lighters, ashtrays--no stashes just in case at home if you are serious  .For Nicotine gum:  When you feel need for smoking:  Place nicotine gum in mouth and chew until juicy, then stick between gum and cheek.  You will absorb the nicotine from your mouth.   Do not aggressively chew gum. Spit gum out in garbage once you feel you are done with it.

## 2016-09-17 ENCOUNTER — Other Ambulatory Visit: Payer: Self-pay | Admitting: Internal Medicine

## 2016-09-17 MED ORDER — ATORVASTATIN CALCIUM 80 MG PO TABS: ORAL_TABLET | ORAL | 11 refills | Status: DC

## 2016-09-17 NOTE — Telephone Encounter (Signed)
Patient notified and will pick up medication tomorrow.

## 2016-09-21 ENCOUNTER — Ambulatory Visit (INDEPENDENT_AMBULATORY_CARE_PROVIDER_SITE_OTHER): Payer: Self-pay

## 2016-09-21 VITALS — BP 160/82 | HR 68

## 2016-09-21 DIAGNOSIS — I10 Essential (primary) hypertension: Secondary | ICD-10-CM

## 2016-09-21 NOTE — Progress Notes (Signed)
Patient has cold and not on any decongestants. Per Dr. Delrae AlfredMulberry continue with current treatment and come back in 1 month for BP and Pulse check.

## 2016-10-05 ENCOUNTER — Telehealth: Payer: Self-pay | Admitting: Internal Medicine

## 2016-10-05 ENCOUNTER — Ambulatory Visit (INDEPENDENT_AMBULATORY_CARE_PROVIDER_SITE_OTHER): Payer: Self-pay | Admitting: Internal Medicine

## 2016-10-05 VITALS — BP 122/70 | HR 76 | Resp 12 | Ht 62.0 in | Wt 173.0 lb

## 2016-10-05 DIAGNOSIS — M545 Low back pain, unspecified: Secondary | ICD-10-CM

## 2016-10-05 DIAGNOSIS — E1142 Type 2 diabetes mellitus with diabetic polyneuropathy: Secondary | ICD-10-CM

## 2016-10-05 LAB — GLUCOSE, POCT (MANUAL RESULT ENTRY): POC Glucose: 187 mg/dl — AB (ref 70–99)

## 2016-10-05 MED ORDER — CYCLOBENZAPRINE HCL 10 MG PO TABS: ORAL_TABLET | ORAL | 0 refills | Status: DC

## 2016-10-05 NOTE — Patient Instructions (Signed)
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

## 2016-10-05 NOTE — Telephone Encounter (Signed)
Patient called in  Stating has a really back pain for the last three days; no fever or another symptoms associated with it. Patient stated will like to see Dr. Delrae AlfredMulberry as soon as possible. Patient will be waiting for the response.

## 2016-10-05 NOTE — Progress Notes (Signed)
   Subjective:    Patient ID: April Shaffer, female    DOB: 07/16/1950, 66 y.o.   MRN: [id number]  HPI   1.  Essential hypertension:  bp much improved with increase in Carvedilol.    2.  Left low back pain for 3-4 days.  When stands, feels a pull on anterior thigh area.  No numbness or tingling in leg.  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   Able to walk.  Hurt this morning when turning over in bed.  Keeps getting a "catching" in her back. Tylenol helped a little.  Tried biofreeze without much improvement.  Current Meds  Medication Sig  . acetaminophen (TYLENOL) 500 MG tablet 2 tabs by mouth twice daily for back and leg pain  . allopurinol (ZYLOPRIM) 300 MG tablet Take 1 tablet (300 mg total) by mouth daily.  Marland Kitchen amLODipine (NORVASC) 2.5 MG tablet Take 1 tablet (2.5 mg total) by mouth daily.  Marland Kitchen aspirin 81 MG tablet Take 1 tablet (81 mg total) by mouth daily.  Marland Kitchen atorvastatin (LIPITOR) 80 MG tablet 1 tab by mouth daily with evening meal  . blood glucose meter kit and supplies KIT Check sugars twice daily  . carvedilol (COREG CR) 20 MG 24 hr capsule 1 cap by mouth daily  . clopidogrel (PLAVIX) 75 MG tablet TAKE ONE TABLET BY MOUTH ONCE DAILY WITH BREAKFAST  . colchicine 0.6 MG tablet 1 tab by mouth twice daily only when needed for gouty attack  . desloratadine (CLARINEX) 5 MG tablet Take 1 tablet (5 mg total) by mouth daily.  . diphenhydrAMINE (BENADRYL) 25 MG tablet Take 25 mg by mouth every 6 (six) hours as needed. For allergies  . gabapentin (NEURONTIN) 300 MG capsule 1 cap by mouth three times daily  . glucose blood test strip Check sugars twice daily  . metFORMIN (GLUCOPHAGE-XR) 500 MG 24 hr tablet 1 tab by mouth twice daily with meals (Patient taking differently: 2 tab by mouth twice daily with meals)  . terbinafine (LAMISIL) 250 MG tablet Take 1 tablet (250 mg total) by mouth daily.    Allergies  Allergen  Reactions  . Lisinopril-Hydrochlorothiazide Other (See Comments)    Angioedema  - Tongue swelling   . Codeine Itching  . Oxycodone Itching  . Simvastatin Other (See Comments)    Muscle pain  . Tramadol Itching    Review of Systems     Objective:   Physical Exam  NAD LUngs:  CTA CV:  RRR without murmur or rub, radial pulses normal and equal Back:  NT over spinous processes of L/S spine.  Tender over left paraspinous musculature only. Neuro:  Motor 5/5, DTRs 2+/4, gait normal. Negative straight leg raise.       Assessment & Plan:  1.  Left low back pain: muscular.  Cyclobenzaprine 10 mg 1/2 to 1 tab every 8 hours as needed for back pain To call if no improvement or if worsens. 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

## 2016-10-05 NOTE — Telephone Encounter (Signed)
Spoke with patient. States she is having lower back pain. Hurts to sot or lay down. Patient coming in for acute appointment today @ 4 pm.

## 2016-10-08 LAB — SPECIMEN STATUS REPORT

## 2016-10-08 LAB — T4, FREE: FREE T4: 0.96 ng/dL (ref 0.82–1.77)

## 2016-10-09 ENCOUNTER — Encounter: Payer: Self-pay | Admitting: Internal Medicine

## 2016-10-30 ENCOUNTER — Encounter: Payer: Self-pay | Admitting: Internal Medicine

## 2016-10-30 ENCOUNTER — Ambulatory Visit (INDEPENDENT_AMBULATORY_CARE_PROVIDER_SITE_OTHER): Payer: Self-pay | Admitting: Internal Medicine

## 2016-10-30 VITALS — BP 140/80 | HR 74 | Resp 12 | Ht 62.0 in | Wt 171.0 lb

## 2016-10-30 DIAGNOSIS — E1169 Type 2 diabetes mellitus with other specified complication: Secondary | ICD-10-CM

## 2016-10-30 DIAGNOSIS — B351 Tinea unguium: Secondary | ICD-10-CM

## 2016-10-30 DIAGNOSIS — E1142 Type 2 diabetes mellitus with diabetic polyneuropathy: Secondary | ICD-10-CM

## 2016-10-30 DIAGNOSIS — I10 Essential (primary) hypertension: Secondary | ICD-10-CM

## 2016-10-30 DIAGNOSIS — E785 Hyperlipidemia, unspecified: Secondary | ICD-10-CM

## 2016-10-30 DIAGNOSIS — M549 Dorsalgia, unspecified: Secondary | ICD-10-CM

## 2016-10-30 NOTE — Progress Notes (Signed)
Subjective:    Patient ID: April Shaffer, female    DOB: 02/05/1951, 66 y.o.   MRN: [id number]  HPI   1.  Toenail onychomycosis:  Has one more week of Terbinafine.  Her left great toenail has normal nail about halfway up from base now.  Difficult to see change with both 5th toenails as so tiny. Continues to treat shoes and shower floor.    2.  Left back/shoulder pain:  Determined by PT not to be rotator cuff syndrome, but working on back to relieve pain per patient  3.  Essential Hypertension:  Did not eat before coming in so has not taken bp meds today.  Tolerating increase of Carvedilol  4.  DM:  Has not had eye exam in past 12 months.  Last was in [place], but cannot recall date.  5.  Thyroid changes:  Mildly elevated TSH--has gradually increased.  Free T4 end of July still in normal range.  Due for recheck end of this month  Current Meds  Medication Sig  . acetaminophen (TYLENOL) 500 MG tablet 2 tabs by mouth twice daily for back and leg pain  . allopurinol (ZYLOPRIM) 300 MG tablet Take 1 tablet (300 mg total) by mouth daily.  Marland Kitchen amLODipine (NORVASC) 2.5 MG tablet Take 1 tablet (2.5 mg total) by mouth daily.  Marland Kitchen aspirin 81 MG tablet Take 1 tablet (81 mg total) by mouth daily.  Marland Kitchen atorvastatin (LIPITOR) 80 MG tablet 1 tab by mouth daily with evening meal  . blood glucose meter kit and supplies KIT Check sugars twice daily  . carvedilol (COREG CR) 20 MG 24 hr capsule 1 cap by mouth daily  . clopidogrel (PLAVIX) 75 MG tablet TAKE ONE TABLET BY MOUTH ONCE DAILY WITH BREAKFAST  . cyclobenzaprine (FLEXERIL) 10 MG tablet 1/2 to 1 tab every 8 hours as needed for low back pain  . gabapentin (NEURONTIN) 300 MG capsule 1 cap by mouth three times daily  . glucose blood test strip Check sugars twice daily  . metFORMIN (GLUCOPHAGE-XR) 500 MG 24 hr tablet 1 tab by mouth twice daily with meals  . terbinafine (LAMISIL) 250 MG tablet Take 1 tablet (250 mg total) by mouth daily.    Allergies    Allergen Reactions  . Lisinopril-Hydrochlorothiazide Other (See Comments)    Angioedema  - Tongue swelling   . Codeine Itching  . Oxycodone Itching  . Simvastatin Other (See Comments)    Muscle pain  . Tramadol Itching      Review of Systems     Objective:   Physical Exam  NAD Lungs:  CTA CV:  RRR without murmur or rub Radial pulses normal and equal Abd:  S, NT, No HSM or mass, + BS LE:  No edema Diabetic Foot Exam - Simple   Simple Foot Form Diabetic Foot exam was performed with the following findings:  Yes 10/30/2016  9:44 AM  Visual Inspection No deformities, no ulcerations, no other skin breakdown bilaterally:  Yes Sensation Testing Intact to touch and monofilament testing bilaterally:  Yes Pulse Check Posterior Tibialis and Dorsalis pulse intact bilaterally:  Yes See comments:  Yes Comments Many superficial broken venous vessels, varicosities of feet and ankles.    Great toenail on left with clearing halfway up from base.  Two smallest toenails difficult to see if clearing due to tiny size      Assessment & Plan:  1. Toenail onychomycosis:  Clearing nicely.  Will hold off on hepatic profile until  complete with Terbinafine and following up for other labs end of month.  2.  Hyperlipidemia:  Now on 80 mg Atorvastatin.  No longer with muscle cramps.  3.  Elevated TSH:  Recheck TSH and  Free T4 end of month with other labs  4.  DM:  Has been well controlled:  Due for A1C with labs end of month.  5.  Essential Hypertension:  Okay today--borderline, but has not taken bp meds.  6.  Left upper back pain:  Reportedly doing well with PT.

## 2016-11-19 ENCOUNTER — Other Ambulatory Visit: Payer: Self-pay

## 2016-11-19 VITALS — BP 172/92 | HR 72

## 2016-11-19 DIAGNOSIS — E010 Iodine-deficiency related diffuse (endemic) goiter: Secondary | ICD-10-CM

## 2016-11-19 DIAGNOSIS — E1169 Type 2 diabetes mellitus with other specified complication: Secondary | ICD-10-CM

## 2016-11-19 DIAGNOSIS — Z79899 Other long term (current) drug therapy: Secondary | ICD-10-CM

## 2016-11-19 DIAGNOSIS — E785 Hyperlipidemia, unspecified: Secondary | ICD-10-CM

## 2016-11-20 LAB — T4, FREE: FREE T4: 1.11 ng/dL (ref 0.82–1.77)

## 2016-11-20 LAB — HEPATIC FUNCTION PANEL
ALT: 21 IU/L (ref 0–32)
AST: 23 IU/L (ref 0–40)
Albumin: 4.3 g/dL (ref 3.6–4.8)
Alkaline Phosphatase: 119 IU/L — ABNORMAL HIGH (ref 39–117)
BILIRUBIN, DIRECT: 0.13 mg/dL (ref 0.00–0.40)
Bilirubin Total: 0.4 mg/dL (ref 0.0–1.2)
Total Protein: 7.4 g/dL (ref 6.0–8.5)

## 2016-11-20 LAB — HGB A1C W/O EAG: HEMOGLOBIN A1C: 6.8 % — AB (ref 4.8–5.6)

## 2016-11-20 LAB — TSH: TSH: 3.29 u[IU]/mL (ref 0.450–4.500)

## 2016-11-20 LAB — LIPID PANEL W/O CHOL/HDL RATIO
Cholesterol, Total: 166 mg/dL (ref 100–199)
HDL: 52 mg/dL (ref >39)
LDL Calculated: 98 mg/dL (ref 0–99)
TRIGLYCERIDES: 78 mg/dL (ref 0–149)
VLDL CHOLESTEROL CAL: 16 mg/dL (ref 5–40)

## 2016-12-05 ENCOUNTER — Other Ambulatory Visit: Payer: Self-pay

## 2016-12-05 DIAGNOSIS — M1A0710 Idiopathic chronic gout, right ankle and foot, without tophus (tophi): Secondary | ICD-10-CM

## 2016-12-05 MED ORDER — ALLOPURINOL 300 MG PO TABS: 300.0000 mg | ORAL_TABLET | Freq: Every day | ORAL | 11 refills | Status: DC

## 2016-12-07 ENCOUNTER — Ambulatory Visit: Payer: Self-pay | Admitting: Internal Medicine

## 2016-12-12 ENCOUNTER — Encounter: Payer: Self-pay | Admitting: Internal Medicine

## 2016-12-12 ENCOUNTER — Ambulatory Visit (INDEPENDENT_AMBULATORY_CARE_PROVIDER_SITE_OTHER): Payer: Self-pay | Admitting: Internal Medicine

## 2016-12-12 VITALS — BP 142/88 | HR 78 | Resp 12 | Ht 62.0 in | Wt 172.0 lb

## 2016-12-12 DIAGNOSIS — M7061 Trochanteric bursitis, right hip: Secondary | ICD-10-CM

## 2016-12-12 DIAGNOSIS — M5417 Radiculopathy, lumbosacral region: Secondary | ICD-10-CM

## 2016-12-12 DIAGNOSIS — E1142 Type 2 diabetes mellitus with diabetic polyneuropathy: Secondary | ICD-10-CM

## 2016-12-12 LAB — GLUCOSE, POCT (MANUAL RESULT ENTRY): POC GLUCOSE: 106 mg/dL — AB (ref 70–99)

## 2016-12-12 MED ORDER — MENTHOL-CAMPHOR 3-3 % EX GEL: CUTANEOUS | 0 refills | Status: DC

## 2016-12-12 NOTE — Patient Instructions (Signed)
On line:  [place] [employer].

## 2016-12-12 NOTE — Progress Notes (Signed)
   Subjective:    Patient ID: April Shaffer, female    DOB: 23-Jul-1950, 66 y.o.   MRN: [id number]  HPI   Right lateral thigh pain--started 1.5 weeks ago.  Has since fallen on her butt, but that does not seem to have worsened it.  Did have some low back pain earlier and radiated to thigh and then anterior thigh, but now really just in lateral thigh and anterior thigh now.   No injury before this pain started.   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  States she first noted it one night rolling over in bed. Current Meds  Medication Sig  . acetaminophen (TYLENOL) 500 MG tablet 2 tabs by mouth twice daily for back and leg pain  . allopurinol (ZYLOPRIM) 300 MG tablet Take 1 tablet (300 mg total) by mouth daily.  Marland Kitchen amLODipine (NORVASC) 2.5 MG tablet Take 1 tablet (2.5 mg total) by mouth daily.  Marland Kitchen aspirin 81 MG tablet Take 1 tablet (81 mg total) by mouth daily.  Marland Kitchen atorvastatin (LIPITOR) 80 MG tablet 1 tab by mouth daily with evening meal  . blood glucose meter kit and supplies KIT Check sugars twice daily  . carvedilol (COREG CR) 20 MG 24 hr capsule 1 cap by mouth daily  . clopidogrel (PLAVIX) 75 MG tablet TAKE ONE TABLET BY MOUTH ONCE DAILY WITH BREAKFAST  . cyclobenzaprine (FLEXERIL) 10 MG tablet 1/2 to 1 tab every 8 hours as needed for low back pain  . diphenhydrAMINE (BENADRYL) 25 MG tablet Take 25 mg by mouth every 6 (six) hours as needed. For allergies  . gabapentin (NEURONTIN) 300 MG capsule 1 cap by mouth three times daily  . glucose blood test strip Check sugars twice daily  . metFORMIN (GLUCOPHAGE-XR) 500 MG 24 hr tablet 1 tab by mouth twice daily with meals    Allergies  Allergen Reactions  . Lisinopril-Hydrochlorothiazide Other (See Comments)    Angioedema  - Tongue swelling   . Codeine Itching  . Oxycodone Itching  . Simvastatin Other (See Comments)    Muscle pain  . Tramadol Itching   Tylenol 500 mg twice daily and an extra Gabapentin during the day (she is  actually only taking a dose in the morning and evening and most days does not take the midday dose as well as the Cyclobenzaprine has helped. .    Review of Systems     Objective:   Physical Exam NAD Lungs:  CTA CV:  RRR without murmur or rub, radial pulses normal and equal Back:  No tenderness over lumbar spinous processes.  Mild tenderness, right upper lumbar paraspinous musculature. Moderate tenderness over rightgreater trochanter, though does not reproduce/worsen all of the discomfort she has. Neuro:  Motor 5/5, DTRs 2+/4, sensory grossly normal of lower limbs bilaterally.  Gait with slight limp favoring right.       Assessment & Plan:   1.  Likely mainly a right L2 radiculopathy, though cannot rule out greater trochanteric bursitis as well.  Recommend and patient interested in more so, topical [place].  She can order over the internet and apply as needed to her right low back and areas of discomfort on leg. Referral to [facility].

## 2017-02-08 ENCOUNTER — Other Ambulatory Visit: Payer: Self-pay

## 2017-02-08 DIAGNOSIS — I739 Peripheral vascular disease, unspecified: Secondary | ICD-10-CM

## 2017-02-08 DIAGNOSIS — I779 Disorder of arteries and arterioles, unspecified: Secondary | ICD-10-CM

## 2017-02-08 DIAGNOSIS — I10 Essential (primary) hypertension: Secondary | ICD-10-CM

## 2017-02-08 MED ORDER — CLOPIDOGREL BISULFATE 75 MG PO TABS: ORAL_TABLET | ORAL | 11 refills | Status: DC

## 2017-02-08 MED ORDER — AMLODIPINE BESYLATE 2.5 MG PO TABS: 2.5000 mg | ORAL_TABLET | Freq: Every day | ORAL | 11 refills | Status: DC

## 2017-02-15 ENCOUNTER — Ambulatory Visit: Payer: Self-pay | Admitting: Internal Medicine

## 2017-02-15 ENCOUNTER — Encounter: Payer: Self-pay | Admitting: Internal Medicine

## 2017-02-15 VITALS — BP 170/90 | HR 82 | Resp 17 | Ht 63.0 in | Wt 172.0 lb

## 2017-02-15 DIAGNOSIS — E1142 Type 2 diabetes mellitus with diabetic polyneuropathy: Secondary | ICD-10-CM

## 2017-02-15 DIAGNOSIS — B351 Tinea unguium: Secondary | ICD-10-CM

## 2017-02-15 DIAGNOSIS — M5416 Radiculopathy, lumbar region: Secondary | ICD-10-CM

## 2017-02-15 DIAGNOSIS — R7989 Other specified abnormal findings of blood chemistry: Secondary | ICD-10-CM

## 2017-02-15 DIAGNOSIS — E785 Hyperlipidemia, unspecified: Secondary | ICD-10-CM

## 2017-02-15 DIAGNOSIS — E1169 Type 2 diabetes mellitus with other specified complication: Secondary | ICD-10-CM

## 2017-02-15 DIAGNOSIS — I10 Essential (primary) hypertension: Secondary | ICD-10-CM

## 2017-02-15 LAB — GLUCOSE, POCT (MANUAL RESULT ENTRY): POC GLUCOSE: 186 mg/dL — AB (ref 70–99)

## 2017-02-15 NOTE — Progress Notes (Signed)
Subjective:    Patient ID: April Shaffer, female    DOB: 01/02/1951, 66 y.o.   MRN: [id number]  HPI   1.  Thyroid:  Last testing was fine with TSH.  Need to recheck one more time end of January.  2.  Essential Hypertension:  Has not missed medication.  3.  Right thigh, possible L2 Radiculopathy vs greater trochanteric bursitis :  Went for 2 sessions at [facility].  Not clear they feel this is a radiculopathy.  Has some improvement, but then snow kept her from getting back.   Has not obtained [place] yet, but knows how to order.  She is using "Ice gel" which helps She plans to call to get back with PT  4.  Toenails almost completely grown out where previously infected with fungus.  Finished her Terbinafine about 2 months ago.  5.  Hyperlipidemia:  Tolerating Atorvastatin, but was not at goal last check end of September.  6.  DM:  A1C in September was 6.8%.  Sugars in very low 100s fasting.  Checks every day once.  Does not miss meds Did get influenza vaccine  Did have eye exam.  Has cataracts.  [facility].  Was told no diabetic injury.   7.  HM:  Needs urine microalbumin and is scheduling her mammogram, due end of month.   No pap as history of hysterectomy.  Current Meds  Medication Sig  . acetaminophen (TYLENOL) 500 MG tablet 2 tabs by mouth twice daily for back and leg pain  . allopurinol (ZYLOPRIM) 300 MG tablet Take 1 tablet (300 mg total) by mouth daily.  Marland Kitchen amLODipine (NORVASC) 2.5 MG tablet Take 1 tablet (2.5 mg total) by mouth daily.  Marland Kitchen aspirin 81 MG tablet Take 1 tablet (81 mg total) by mouth daily.  Marland Kitchen atorvastatin (LIPITOR) 80 MG tablet 1 tab by mouth daily with evening meal  . blood glucose meter kit and supplies KIT Check sugars twice daily  . carvedilol (COREG CR) 20 MG 24 hr capsule 1 cap by mouth daily  . clopidogrel (PLAVIX) 75 MG tablet TAKE ONE TABLET BY MOUTH ONCE DAILY WITH BREAKFAST  . colchicine 0.6 MG tablet 1 tab by mouth twice daily only when  needed for gouty attack  . cyclobenzaprine (FLEXERIL) 10 MG tablet 1/2 to 1 tab every 8 hours as needed for low back pain  . gabapentin (NEURONTIN) 300 MG capsule 1 cap by mouth three times daily  . glucose blood test strip Check sugars twice daily  . Menthol-Camphor [facility] WARM THERAPY) 3-3 % GEL Apply to painful joints and muscles as needed  . metFORMIN (GLUCOPHAGE-XR) 500 MG 24 hr tablet 1 tab by mouth twice daily with meals    Allergies  Allergen Reactions  . Lisinopril-Hydrochlorothiazide Other (See Comments)    Angioedema  - Tongue swelling   . Codeine Itching  . Oxycodone Itching  . Simvastatin Other (See Comments)    Muscle pain  . Tramadol Itching      Review of Systems     Objective:   Physical Exam  NAD Lungs:  CTA CV:  RRR with normal S1 and S2, No S3, S4 or murmur.  Radial and DP pulses normal and equal LE:  No edema. Left great toenail with sliver of thickening and crumbling.        Assessment & Plan:  1.  Essential Hypertension:  To return for bp check next Thursday having already taken meds.  If still high,  will increase Amlodipine.  Continue with Carvedilol.  2.  Previously elevated TSH:  Normalized.  One last check with fasting labs end of January.  3.   Right thigh pain, possible L2 Radiculopathy:  Improved with PT.  She will get back with them.  4.  DM: has been well controlled, though trending up with A1C.  Recheck end of January with urine Microalbumin/crea. Immunizations current.  5.   Toenail onychomycosis:  Essentially resolved.  Sliver of abnormal nail left at tips  6.  Hyperlipidemia:  FLP, CMP end of January.

## 2017-02-21 ENCOUNTER — Ambulatory Visit (INDEPENDENT_AMBULATORY_CARE_PROVIDER_SITE_OTHER): Payer: Self-pay

## 2017-02-21 VITALS — BP 140/90 | HR 74

## 2017-02-21 DIAGNOSIS — I10 Essential (primary) hypertension: Secondary | ICD-10-CM

## 2017-02-21 NOTE — Progress Notes (Signed)
Informed patient will contact her tomorrow after Dr. Delrae AlfredMulberry returns to let her know if she needs to increase her BP medication.

## 2017-02-22 ENCOUNTER — Other Ambulatory Visit: Payer: Self-pay

## 2017-02-22 DIAGNOSIS — E1142 Type 2 diabetes mellitus with diabetic polyneuropathy: Secondary | ICD-10-CM

## 2017-02-22 MED ORDER — GABAPENTIN 300 MG PO CAPS: ORAL_CAPSULE | ORAL | 11 refills | Status: DC

## 2017-02-22 MED ORDER — AMLODIPINE BESYLATE 5 MG PO TABS: 5.0000 mg | ORAL_TABLET | Freq: Every day | ORAL | 11 refills | Status: DC

## 2017-03-01 ENCOUNTER — Ambulatory Visit (HOSPITAL_COMMUNITY)
Admission: RE | Admit: 2017-03-01 | Discharge: 2017-03-01 | Disposition: A | Discharge status: Home / Self Care | Payer: No Typology Code available for payment source | Source: Ambulatory Visit | Attending: Vascular Surgery | Admitting: Vascular Surgery

## 2017-03-01 ENCOUNTER — Ambulatory Visit (INDEPENDENT_AMBULATORY_CARE_PROVIDER_SITE_OTHER): Payer: No Typology Code available for payment source | Admitting: Vascular Surgery

## 2017-03-01 ENCOUNTER — Encounter: Payer: Self-pay | Admitting: Vascular Surgery

## 2017-03-01 VITALS — BP 156/79 | HR 72 | Resp 20 | Ht 63.0 in | Wt 174.0 lb

## 2017-03-01 DIAGNOSIS — I6521 Occlusion and stenosis of right carotid artery: Secondary | ICD-10-CM

## 2017-03-01 LAB — VAS US CAROTID
LEFT ECA DIAS: -34 cm/s
Left CCA dist dias: -29 cm/s
Left CCA dist sys: -110 cm/s
Left CCA prox dias: 22 cm/s
Left CCA prox sys: 92 cm/s
Left ICA dist dias: -33 cm/s
Left ICA dist sys: -99 cm/s
Left ICA prox dias: -55 cm/s
Left ICA prox sys: -258 cm/s
RIGHT CCA MID DIAS: -12 cm/s
RIGHT ECA DIAS: -24 cm/s
Right CCA prox dias: 5 cm/s
Right CCA prox sys: 60 cm/s

## 2017-03-01 NOTE — Progress Notes (Signed)
Patient ID: April Shaffer, female   DOB: [birth year], 67 y.o.   MRN: [id number]  Reason for Consult: Follow-up (6 month f/u )   Referred by Mack Hook, MD  Subjective:     HPI:  April Shaffer is a 67 y.o. female with history of a right carotid endarterectomy 2013 is subsequently occluded and she was a symptomatically.  She is in fact never had stroke, TIA or amaurosis.  She has carotid stenosis on the left which has been evaluated with CT angios in 6 months ago with duplex.  She is nearly quit smoking at this time and has made a resolution to do so.  She is on aspirin Plavix and statin.  She remains at her normal level of activity continues to walk without issue.  She has no complaints related to today's visit.  Past Medical History:  Diagnosis Date  . Arthritis   . Carotid artery narrowing   . Coronary artery disease    Cardiac catheterization November 2013: 50% ostial LAD stenosis 50% mid stenosis. 30% disease in the left circumflex.  . Diabetes mellitus, type 2 ([place])   . Hyperlipidemia   . Hypertension   . Onychomycosis of toenail 07/31/2016  . Sleep apnea    hx had tonsilectomy and no longer has    Family History  Problem Relation Age of Onset  . Hypertension Sister   . Hypothyroidism Sister   . Cancer Maternal Grandmother        lung  . Hypertension Sister   . Hypertension Son    Past Surgical History:  Procedure Laterality Date  . CARDIAC CATHETERIZATION    . CAROTID ENDARTERECTOMY Right 09-27-11   cea  . ENDARTERECTOMY  09/27/2011   Procedure: RIGT ENDARTERECTOMY CAROTID;  Surgeon: Mal Misty, MD;  Location: [facility];  Service: Vascular;  Laterality: Right;  . EPIDURAL BLOCK INJECTION  02/2008   Drs. Eulis Manly  . gyn surgery  2004   total hysterectomy for mennorhagia,,salpingoophorectomy  . TONSILLECTOMY    . TOTAL ABDOMINAL HYSTERECTOMY W/ BILATERAL SALPINGOOPHORECTOMY Bilateral 2000  . VARICOSE VEIN SURGERY  2008   stripping    Short  Social History:  Social History   Tobacco Use  . Smoking status: Light Tobacco Smoker    Packs/day: 0.25    Years: 25.00    Pack years: 6.25    Types: Cigarettes    Start date: 05/31/1968  . Smokeless tobacco: Never Used  . Tobacco comment: pt states she smokes about 3-4 cigs per --states she contiues to work on it.  No interest in other help now.  Substance Use Topics  . Alcohol use: Yes    Alcohol/week: 1.2 oz    Types: 2 Shots of liquor per week    Comment: 2 drinks a day    Allergies  Allergen Reactions  . Lisinopril-Hydrochlorothiazide Other (See Comments)    Angioedema  - Tongue swelling   . Codeine Itching  . Oxycodone Itching  . Simvastatin Other (See Comments)    Muscle pain  . Tramadol Itching    Current Outpatient Medications  Medication Sig Dispense Refill  . acetaminophen (TYLENOL) 500 MG tablet 2 tabs by mouth twice daily for back and leg pain 30 tablet 0  . allopurinol (ZYLOPRIM) 300 MG tablet Take 1 tablet (300 mg total) by mouth daily. 30 tablet 11  . amLODipine (NORVASC) 5 MG tablet Take 1 tablet (5 mg total) by mouth daily. 30 tablet 11  . aspirin 81 MG  tablet Take 1 tablet (81 mg total) by mouth daily. 30 tablet   . atorvastatin (LIPITOR) 80 MG tablet 1 tab by mouth daily with evening meal 30 tablet 11  . blood glucose meter kit and supplies KIT Check sugars twice daily 1 each 0  . carvedilol (COREG CR) 20 MG 24 hr capsule 1 cap by mouth daily 30 capsule 11  . clopidogrel (PLAVIX) 75 MG tablet TAKE ONE TABLET BY MOUTH ONCE DAILY WITH BREAKFAST 30 tablet 11  . colchicine 0.6 MG tablet 1 tab by mouth twice daily only when needed for gouty attack 60 tablet 4  . cyclobenzaprine (FLEXERIL) 10 MG tablet 1/2 to 1 tab every 8 hours as needed for low back pain 20 tablet 0  . desloratadine (CLARINEX) 5 MG tablet Take 1 tablet (5 mg total) by mouth daily. 30 tablet 11  . diphenhydrAMINE (BENADRYL) 25 MG tablet Take 25 mg by mouth every 6 (six) hours as needed. For  allergies    . gabapentin (NEURONTIN) 300 MG capsule 1 cap by mouth three times daily 90 capsule 11  . glucose blood test strip Check sugars twice daily 100 each 11  . Menthol-Camphor ([place] WARM THERAPY) 3-3 % GEL Apply to painful joints and muscles as needed  0  . metFORMIN (GLUCOPHAGE-XR) 500 MG 24 hr tablet 1 tab by mouth twice daily with meals 60 tablet 11   No current facility-administered medications for this visit.     Review of Systems  Constitutional:  Constitutional negative. HENT: HENT negative.  Eyes: Eyes negative.  Respiratory: Respiratory negative.  Cardiovascular: Cardiovascular negative.  GI: Gastrointestinal negative.  Musculoskeletal: Musculoskeletal negative.  Skin: Skin negative.  Neurological: Neurological negative. Hematologic: Hematologic/lymphatic negative.  Psychiatric: Psychiatric negative.        Objective:  Objective   Vitals:   03/01/17 1020 03/01/17 1021  BP: (!) 162/78 (!) 156/79  Pulse: 72   Resp: 20   SpO2: 100%   Weight: 174 lb (78.9 kg)   Height: _0  (1.6 m)    Body mass index is 30.82 kg/m.  Physical Exam  Constitutional: She is oriented to person, place, and time. She appears well-developed.  HENT:  Head: Normocephalic.  Eyes: Pupils are equal, round, and reactive to light.  Neck: Normal range of motion.  Cardiovascular:  Pulses:      Carotid pulses are 2+ on the right side, and 2+ on the left side.      Radial pulses are 2+ on the right side, and 2+ on the left side.  Pulmonary/Chest: Effort normal.  Abdominal: Soft.  Musculoskeletal: Normal range of motion. She exhibits no edema.  Neurological: She is alert and oriented to person, place, and time. No cranial nerve deficit.  Skin: Skin is warm and dry.  Psychiatric: She has a normal mood and affect. Her behavior is normal. Judgment and thought content normal.    Data: I have independently interpreted her cerebrovascular duplex examination which demonstrates known  right internal carotid artery occlusion with left ICA stenosis 17-51% with peak systolic velocity 025 and EDV of 76.     Assessment/Plan:     67 year old female here for evaluation of carotid stenosis.  She remains asymptomatic with a known occluded right ICA and 60-79% stenosis on the left which may be falsely elevated due to the contralateral occlusion.  She remains on dual antiplatelet therapy and a statin and can follow-up in 1 year.  We discussed the signs and symptoms of stroke and TIA that  would merit emergent evaluation and she demonstrates good understanding.     Waynetta Sandy MD [facility]

## 2017-03-04 NOTE — Addendum Note (Signed)
Addended by: Burton ApleyPETTY, Sharah Finnell A on: 03/04/2017 11:36 AM   Modules accepted: Orders

## 2017-03-11 ENCOUNTER — Ambulatory Visit (INDEPENDENT_AMBULATORY_CARE_PROVIDER_SITE_OTHER): Payer: Self-pay | Admitting: Internal Medicine

## 2017-03-11 VITALS — BP 142/72 | HR 86

## 2017-03-11 DIAGNOSIS — I10 Essential (primary) hypertension: Secondary | ICD-10-CM

## 2017-03-11 NOTE — Progress Notes (Signed)
Patient BP still elevated at 142/72. Per Dr. Delrae AlfredMulberry have patient to increase Amlodipine to 10 mg daily. Patient verbalized understanding. Patient will come back 1 week for BP check.

## 2017-03-19 ENCOUNTER — Ambulatory Visit (INDEPENDENT_AMBULATORY_CARE_PROVIDER_SITE_OTHER): Payer: Self-pay

## 2017-03-19 VITALS — BP 130/70 | HR 84

## 2017-03-19 DIAGNOSIS — I10 Essential (primary) hypertension: Secondary | ICD-10-CM

## 2017-03-19 MED ORDER — AMLODIPINE BESYLATE 10 MG PO TABS: 10.0000 mg | ORAL_TABLET | Freq: Every day | ORAL | 11 refills | Status: DC

## 2017-03-19 NOTE — Progress Notes (Signed)
Patient BP within normal range now at 130/70. Patient asked for new Rx for 10 mg be called into [facility]. RX sent to pharmacy for 10 mg. Informed patient to stay on current dose. Patient verbalized understanding

## 2017-03-25 ENCOUNTER — Other Ambulatory Visit (INDEPENDENT_AMBULATORY_CARE_PROVIDER_SITE_OTHER): Payer: Self-pay

## 2017-03-25 DIAGNOSIS — E782 Mixed hyperlipidemia: Secondary | ICD-10-CM

## 2017-03-25 DIAGNOSIS — Z79899 Other long term (current) drug therapy: Secondary | ICD-10-CM

## 2017-03-25 DIAGNOSIS — E1142 Type 2 diabetes mellitus with diabetic polyneuropathy: Secondary | ICD-10-CM

## 2017-03-26 LAB — COMPREHENSIVE METABOLIC PANEL
A/G RATIO: 1.2 (ref 1.2–2.2)
ALBUMIN: 4.2 g/dL (ref 3.6–4.8)
ALT: 21 IU/L (ref 0–32)
AST: 31 IU/L (ref 0–40)
Alkaline Phosphatase: 123 IU/L — ABNORMAL HIGH (ref 39–117)
BILIRUBIN TOTAL: 0.7 mg/dL (ref 0.0–1.2)
BUN / CREAT RATIO: 15 (ref 12–28)
BUN: 13 mg/dL (ref 8–27)
CALCIUM: 8.7 mg/dL (ref 8.7–10.3)
CHLORIDE: 102 mmol/L (ref 96–106)
CO2: 22 mmol/L (ref 20–29)
Creatinine, Ser: 0.86 mg/dL (ref 0.57–1.00)
GFR, EST AFRICAN AMERICAN: 83 mL/min/{1.73_m2} (ref >59)
GFR, EST NON AFRICAN AMERICAN: 72 mL/min/{1.73_m2} (ref >59)
GLOBULIN, TOTAL: 3.5 g/dL (ref 1.5–4.5)
Glucose: 127 mg/dL — ABNORMAL HIGH (ref 65–99)
POTASSIUM: 4 mmol/L (ref 3.5–5.2)
SODIUM: 142 mmol/L (ref 134–144)
TOTAL PROTEIN: 7.7 g/dL (ref 6.0–8.5)

## 2017-03-26 LAB — LIPID PANEL W/O CHOL/HDL RATIO
Cholesterol, Total: 162 mg/dL (ref 100–199)
HDL: 56 mg/dL (ref >39)
LDL Calculated: 89 mg/dL (ref 0–99)
Triglycerides: 83 mg/dL (ref 0–149)
VLDL Cholesterol Cal: 17 mg/dL (ref 5–40)

## 2017-03-26 LAB — MICROALBUMIN / CREATININE URINE RATIO
CREATININE, UR: 75.4 mg/dL
Microalb/Creat Ratio: 366.2 mg/g creat — ABNORMAL HIGH (ref 0.0–30.0)
Microalbumin, Urine: 276.1 ug/mL

## 2017-03-26 LAB — HGB A1C W/O EAG: HEMOGLOBIN A1C: 7 % — AB (ref 4.8–5.6)

## 2017-04-07 ENCOUNTER — Other Ambulatory Visit: Payer: Self-pay | Admitting: Internal Medicine

## 2017-04-07 DIAGNOSIS — E1142 Type 2 diabetes mellitus with diabetic polyneuropathy: Secondary | ICD-10-CM

## 2017-04-08 ENCOUNTER — Other Ambulatory Visit: Payer: Self-pay | Admitting: Internal Medicine

## 2017-04-08 DIAGNOSIS — Z1231 Encounter for screening mammogram for malignant neoplasm of breast: Secondary | ICD-10-CM

## 2017-04-11 ENCOUNTER — Ambulatory Visit
Admission: RE | Admit: 2017-04-11 | Discharge: 2017-04-11 | Disposition: A | Discharge status: Home / Self Care | Payer: No Typology Code available for payment source | Source: Ambulatory Visit | Attending: Internal Medicine | Admitting: Internal Medicine

## 2017-04-11 DIAGNOSIS — Z1231 Encounter for screening mammogram for malignant neoplasm of breast: Secondary | ICD-10-CM

## 2017-04-24 ENCOUNTER — Other Ambulatory Visit (INDEPENDENT_AMBULATORY_CARE_PROVIDER_SITE_OTHER): Payer: Self-pay

## 2017-04-24 DIAGNOSIS — R7989 Other specified abnormal findings of blood chemistry: Secondary | ICD-10-CM

## 2017-04-25 LAB — TSH: TSH: 2.43 u[IU]/mL (ref 0.450–4.500)

## 2017-06-11 ENCOUNTER — Ambulatory Visit (INDEPENDENT_AMBULATORY_CARE_PROVIDER_SITE_OTHER): Payer: Medicare HMO | Admitting: Internal Medicine

## 2017-06-11 ENCOUNTER — Encounter: Payer: Self-pay | Admitting: Internal Medicine

## 2017-06-11 VITALS — BP 140/78 | HR 78 | Resp 12 | Ht 63.0 in | Wt 170.0 lb

## 2017-06-11 DIAGNOSIS — M542 Cervicalgia: Secondary | ICD-10-CM

## 2017-06-11 MED ORDER — CYCLOBENZAPRINE HCL 5 MG PO TABS: 5.0000 mg | ORAL_TABLET | Freq: Three times a day (TID) | ORAL | 1 refills | Status: DC | PRN

## 2017-06-11 MED ORDER — HYDROCODONE-ACETAMINOPHEN 5-325 MG PO TABS: 1.0000 | ORAL_TABLET | Freq: Four times a day (QID) | ORAL | 0 refills | Status: DC | PRN

## 2017-06-11 NOTE — Progress Notes (Signed)
   Subjective:    Patient ID: April Shaffer, female    DOB: 1950-12-03, 67 y.o.   MRN: [id number]  HPI   Right neck pain:  Awakened with this yesterday morning.  Slept funny.  Using topical icy hot, oral muscle relaxant (cyclobenzaprine) and some sort of arthritis oral medication. Does not recall doing anything the day or two prior that might have set this off.  No numbness or tingling in her arms or legs.  No weakness as well.    Current Meds  Medication Sig  . acetaminophen (TYLENOL) 500 MG tablet 2 tabs by mouth twice daily for back and leg pain  . allopurinol (ZYLOPRIM) 300 MG tablet Take 1 tablet (300 mg total) by mouth daily.  Marland Kitchen amLODipine (NORVASC) 10 MG tablet Take 1 tablet (10 mg total) by mouth daily.  Marland Kitchen aspirin 81 MG tablet Take 1 tablet (81 mg total) by mouth daily.  Marland Kitchen atorvastatin (LIPITOR) 80 MG tablet 1 tab by mouth daily with evening meal  . blood glucose meter kit and supplies KIT Check sugars twice daily  . carvedilol (COREG CR) 20 MG 24 hr capsule 1 cap by mouth daily  . clopidogrel (PLAVIX) 75 MG tablet TAKE ONE TABLET BY MOUTH ONCE DAILY WITH BREAKFAST  . colchicine 0.6 MG tablet 1 tab by mouth twice daily only when needed for gouty attack  . cyclobenzaprine (FLEXERIL) 10 MG tablet 1/2 to 1 tab every 8 hours as needed for low back pain  . desloratadine (CLARINEX) 5 MG tablet Take 1 tablet (5 mg total) by mouth daily.  . diphenhydrAMINE (BENADRYL) 25 MG tablet Take 25 mg by mouth every 6 (six) hours as needed. For allergies  . gabapentin (NEURONTIN) 300 MG capsule 1 cap by mouth three times daily  . glucose blood test strip Check sugars twice daily  . metFORMIN (GLUCOPHAGE-XR) 500 MG 24 hr tablet TAKE ONE TABLET BY MOUTH TWICE DAILY WITH MEALS    Allergies  Allergen Reactions  . Lisinopril-Hydrochlorothiazide Other (See Comments)    Angioedema  - Tongue swelling   . Codeine Itching  . Oxycodone Itching  . Simvastatin Other (See Comments)    Muscle pain  .  Tramadol Itching       Review of Systems     Objective:   Physical Exam In obvious pain. Very tender over right SCM muscle, somewhat over trap and right paraspinous cervical musculature.   Very limited movement due to pain of right neck.   NT over cervical spinous processes. Neuro:  A & O x 3, CN  II-XII grossly intact.  Motor 5/5 throughout DTRs 2+/4 throughout UE. Lungs:  CTA CV:  RRR without murmur or rub.       Assessment & Plan:  Right SCM muscle spasm and right paraspinous muscular spasm. Soft cervical collar to rest muscle. Limited Hydrocodone:  Pt. Will take with Benadryl as has had some itching in past, but has done this multiple times before without itching or rash. Cyclobenzaprine every 8 hours as needed.

## 2017-06-17 ENCOUNTER — Ambulatory Visit: Payer: Medicare HMO | Admitting: Internal Medicine

## 2017-06-17 ENCOUNTER — Encounter: Payer: Self-pay | Admitting: Internal Medicine

## 2017-06-17 VITALS — BP 130/80 | HR 86 | Resp 12 | Ht 63.0 in | Wt 167.0 lb

## 2017-06-17 DIAGNOSIS — Z1211 Encounter for screening for malignant neoplasm of colon: Secondary | ICD-10-CM | POA: Diagnosis not present

## 2017-06-17 DIAGNOSIS — M542 Cervicalgia: Secondary | ICD-10-CM

## 2017-06-17 DIAGNOSIS — Z23 Encounter for immunization: Secondary | ICD-10-CM

## 2017-06-17 DIAGNOSIS — Z0000 Encounter for general adult medical examination without abnormal findings: Secondary | ICD-10-CM | POA: Diagnosis not present

## 2017-06-17 DIAGNOSIS — Z79899 Other long term (current) drug therapy: Secondary | ICD-10-CM

## 2017-06-17 DIAGNOSIS — E782 Mixed hyperlipidemia: Secondary | ICD-10-CM

## 2017-06-17 DIAGNOSIS — Z780 Asymptomatic menopausal state: Secondary | ICD-10-CM | POA: Diagnosis not present

## 2017-06-17 DIAGNOSIS — E1142 Type 2 diabetes mellitus with diabetic polyneuropathy: Secondary | ICD-10-CM | POA: Diagnosis not present

## 2017-06-17 LAB — GLUCOSE, POCT (MANUAL RESULT ENTRY): POC Glucose: 137 mg/dl — AB (ref 70–99)

## 2017-06-17 MED ORDER — FEXOFENADINE HCL 180 MG PO TABS: 180.0000 mg | ORAL_TABLET | Freq: Every day | ORAL | Status: DC

## 2017-06-17 MED ORDER — METAXALONE 400 MG PO TABS: ORAL_TABLET | ORAL | 0 refills | Status: DC

## 2017-06-17 NOTE — Progress Notes (Signed)
Subjective:    Patient ID: April Shaffer, female    DOB: 03-04-1950, 67 y.o.   MRN: [id number]  HPI   CPE with pap  1.  Pap:  No history of abnormal pap.  TAH and BSO years ago for benign reasons.  No family history of cervical cancer.  2.  Mammogram:  Last mammogram was 04/11/17 and normal.  No family history of breast cancer.  3.  Osteoprevention:  Limited dairy.  Not taking calcium or vitamin D.  Last DEXA in 2015 and normal.  No family history of osteoporosis.  4.  Guaiac Cards:  Last Guaiac cards end of 2017 and negative x 3.  Last colonoscopy in 2009 and normal with hyperplastic polyp.  Due this year.  5.  Colonoscopy:  She cannot recall the name of the physician--on [place] in 2009.  Female GI physician.  6.  Immunizations: Immunization History  Administered Date(s) Administered  . Influenza Inj Mdck Quad Pf 01/31/2016  . Influenza Split 12/10/2011  . Influenza Whole 12/28/2007, 11/26/2008, 02/03/2010  . Influenza,inj,Quad PF,6+ Mos 12/14/2014  . Influenza-Unspecified 12/21/2013, 11/24/2016  . Pneumococcal Conjugate-13 04/15/2014  . Pneumococcal Polysaccharide-23 02/27/2003, 02/03/2010  . Tdap 08/08/2010    7.  Glucose/Cholesterol:  Has known DM and hyperlipidemia.  Due for recheck of both today.  Current Meds  Medication Sig  . acetaminophen (TYLENOL) 500 MG tablet 2 tabs by mouth twice daily for back and leg pain  . allopurinol (ZYLOPRIM) 300 MG tablet Take 1 tablet (300 mg total) by mouth daily.  Marland Kitchen amLODipine (NORVASC) 10 MG tablet Take 1 tablet (10 mg total) by mouth daily.  Marland Kitchen aspirin 81 MG tablet Take 1 tablet (81 mg total) by mouth daily.  Marland Kitchen atorvastatin (LIPITOR) 80 MG tablet 1 tab by mouth daily with evening meal  . blood glucose meter kit and supplies KIT Check sugars twice daily  . carvedilol (COREG CR) 20 MG 24 hr capsule 1 cap by mouth daily  . clopidogrel (PLAVIX) 75 MG tablet TAKE ONE TABLET BY MOUTH ONCE DAILY WITH BREAKFAST  . colchicine 0.6 MG  tablet 1 tab by mouth twice daily only when needed for gouty attack  . cyclobenzaprine (FLEXERIL) 5 MG tablet Take 1 tablet (5 mg total) by mouth 3 (three) times daily as needed for muscle spasms.  Marland Kitchen desloratadine (CLARINEX) 5 MG tablet Take 1 tablet (5 mg total) by mouth daily.  . diphenhydrAMINE (BENADRYL) 25 MG tablet Take 25 mg by mouth every 6 (six) hours as needed. For allergies  . gabapentin (NEURONTIN) 300 MG capsule 1 cap by mouth three times daily  . glucose blood test strip Check sugars twice daily  . HYDROcodone-acetaminophen (NORCO/VICODIN) 5-325 MG tablet Take 1 tablet by mouth every 6 (six) hours as needed for moderate pain.  . metFORMIN (GLUCOPHAGE-XR) 500 MG 24 hr tablet TAKE ONE TABLET BY MOUTH TWICE DAILY WITH MEALS    Allergies  Allergen Reactions  . Lisinopril-Hydrochlorothiazide Other (See Comments)    Angioedema  - Tongue swelling   . Codeine Itching  . Oxycodone Itching  . Simvastatin Other (See Comments)    Muscle pain  . Tramadol Itching    Past Medical History:  Diagnosis Date  . Arthritis   . Carotid artery narrowing   . Coronary artery disease    Cardiac catheterization November 2013: 50% ostial LAD stenosis 50% mid stenosis. 30% disease in the left circumflex.  . Diabetes mellitus, type 2 ([place])   . Hyperlipidemia   .  Hypertension   . Onychomycosis of toenail 07/31/2016  . Sleep apnea    hx had tonsilectomy and no longer has     Past Surgical History:  Procedure Laterality Date  . CARDIAC CATHETERIZATION    . CAROTID ENDARTERECTOMY Right 09-27-11   cea  . ENDARTERECTOMY  09/27/2011   Procedure: RIGT ENDARTERECTOMY CAROTID;  Surgeon: Mal Misty, MD;  Location: [facility];  Service: Vascular;  Laterality: Right;  . EPIDURAL BLOCK INJECTION  02/2008   Drs. Eulis Manly  . gyn surgery  2004   total hysterectomy for mennorhagia,,salpingoophorectomy  . TONSILLECTOMY    . TOTAL ABDOMINAL HYSTERECTOMY W/ BILATERAL SALPINGOOPHORECTOMY Bilateral 2000    . VARICOSE VEIN SURGERY  2008   stripping    Family History  Problem Relation Age of Onset  . Hypertension Sister   . Hypothyroidism Sister   . Cancer Maternal Grandmother        lung  . Hypertension Son   . Hypertension Sister   . Diabetes Brother        lost toe in 2018 with new diagnosis of DM  . Hypertension Son     Social History   Socioeconomic History  . Marital status: Divorced    Spouse name: Leotis Pain  . Number of children: 2  . Years of education: 86  . Highest education level: Not on file  Occupational History  . Occupation: housekeeping-retired    Comment: [employer]-retired 07/23/2014  Social Needs  . Financial resource strain: Not on file  . Food insecurity:    Worry: Never true    Inability: Never true  . Transportation needs:    Medical: No    Non-medical: No  Tobacco Use  . Smoking status: Light Tobacco Smoker    Packs/day: 0.10    Years: 25.00    Pack years: 2.50    Types: Cigarettes    Start date: 05/31/1968  . Smokeless tobacco: Never Used  . Tobacco comment: pt states she smokes about 3-4 cigs per --states she contiues to work on it.  No interest in other help now.  Substance and Sexual Activity  . Alcohol use: Yes    Alcohol/week: 1.2 oz    Types: 2 Shots of liquor per week    Comment: 2 drinks a day  . Drug use: No  . Sexual activity: Not on file  Lifestyle  . Physical activity:    Days per week: 0 days    Minutes per session: 0 min  . Stress: Not at all  Relationships  . Social connections:    Talks on phone: More than three times a week    Gets together: More than three times a week    Attends religious service: Not on file    Active member of club or organization: No    Attends meetings of clubs or organizations: Never    Relationship status: Divorced  . Intimate partner violence:    Fear of current or ex partner: No    Emotionally abused: No    Physically abused: No    Forced sexual activity: No  Other  Topics Concern  . Not on file  Social History Narrative   Regular exercise, walking   Diet-    Lives with long term boyfriend (20+ years together)   Both sons live in [place]      Review of Systems  Constitutional: Negative for appetite change, fever and unexpected weight change.  HENT: Positive for dental problem (Has cavities that  need work and will take care of that through her new Medicare plan.  Planning to get a partial as well.). Negative for hearing loss, postnasal drip and sneezing.   Eyes: Positive for itching.  Respiratory: Negative for cough and shortness of breath.   Cardiovascular: Negative for chest pain, palpitations and leg swelling.  Gastrointestinal: Negative for abdominal distention, blood in stool (no melena.), constipation and diarrhea.  Genitourinary: Negative for dysuria and vaginal discharge.  Musculoskeletal:       Right neck pain:  See visit from last week with muscular neck pain.   Has soft collar.  Hydrocodone and Cyclobenzaprine.  Continues to have pain mainly of SCM on right.   No new numbness or tingling of right hand or arm Willing to consider PT.  Skin: Negative for rash.  Neurological: Positive for numbness (hands and feet from DM.). Negative for dizziness.  Psychiatric/Behavioral: Negative for dysphoric mood. The patient is not nervous/anxious.        Objective:   Physical Exam  Constitutional: She appears well-developed and well-nourished.  HENT:  Head: Normocephalic and atraumatic.  Right Ear: Hearing, tympanic membrane, external ear and ear canal normal.  Left Ear: Hearing, tympanic membrane, external ear and ear canal normal.  Nose: Nose normal.  Mouth/Throat: Uvula is midline, oropharynx is clear and moist and mucous membranes are normal.  Eyes: Pupils are equal, round, and reactive to light. Conjunctivae and EOM are normal.  Discs sharp bilaterally  Neck: Normal range of motion. Neck supple. Thyromegaly (mildly generous thyroid)  present. No thyroid mass present.  Tender over right SCM muscle.  Mildly decreased ROM due to pain and stiffness.  Cardiovascular: Normal rate, regular rhythm, S1 normal and S2 normal. Exam reveals no S3, no S4 and no friction rub.  No murmur heard. Scar from previous endarterectomy over right carotid.  No carotid bruits.  Carotid, radial, femoral pulses normal and equal.  Decreased DP and PT pulses bilaterally Lower extremities with varicosities bilaterally and broken spider veins of feet.   Pulmonary/Chest: Effort normal and breath sounds normal. Right breast exhibits no inverted nipple, no mass, no nipple discharge and no skin change. Left breast exhibits no inverted nipple, no mass, no nipple discharge, no skin change and no tenderness.  Abdominal: Soft. Bowel sounds are normal. She exhibits no mass. There is no hepatosplenomegaly. There is no tenderness. No hernia.  Genitourinary: Rectal exam shows no mass, anal tone normal and guaiac negative stool.  Musculoskeletal: Normal range of motion.  Lymphadenopathy:       Head (right side): No submental and no submandibular adenopathy present.       Head (left side): No submental and no submandibular adenopathy present.    She has no cervical adenopathy.    She has no axillary adenopathy.       Right: No inguinal and no supraclavicular adenopathy present.       Left: No inguinal and no supraclavicular adenopathy present.  Neurological: She is alert. She has normal strength and normal reflexes. No cranial nerve deficit or sensory deficit. Coordination and gait normal.  Skin: Skin is warm and dry. No lesion and no rash noted.  Psychiatric: She has a normal mood and affect. Her speech is normal and behavior is normal. Judgment and thought content normal. Cognition and memory are normal.      Assessment & Plan:  1.  CPE without pap Pneumococcal 23 v Recommend influenza vaccine in the fall. DEXA Bone Density Schedule repeat screening  colonoscopy  2.  Right Neck Pain:  PT referral.  Skelaxin as needed.  Gentle stretching for now.  3.  DM:  A1C, CMP.  Referral for annual diabetic eye exam  4.  Dental concerns:  Patient to schedule dental app.  5.  Hyperlipidemia:  FLP

## 2017-06-18 LAB — COMPREHENSIVE METABOLIC PANEL
ALBUMIN: 4.1 g/dL (ref 3.6–4.8)
ALT: 14 IU/L (ref 0–32)
AST: 18 IU/L (ref 0–40)
Albumin/Globulin Ratio: 1.2 (ref 1.2–2.2)
Alkaline Phosphatase: 121 IU/L — ABNORMAL HIGH (ref 39–117)
BILIRUBIN TOTAL: 0.6 mg/dL (ref 0.0–1.2)
BUN / CREAT RATIO: 11 — AB (ref 12–28)
BUN: 8 mg/dL (ref 8–27)
CALCIUM: 9.2 mg/dL (ref 8.7–10.3)
CHLORIDE: 97 mmol/L (ref 96–106)
CO2: 23 mmol/L (ref 20–29)
CREATININE: 0.76 mg/dL (ref 0.57–1.00)
GFR, EST AFRICAN AMERICAN: 95 mL/min/{1.73_m2} (ref >59)
GFR, EST NON AFRICAN AMERICAN: 83 mL/min/{1.73_m2} (ref >59)
GLUCOSE: 116 mg/dL — AB (ref 65–99)
Globulin, Total: 3.5 g/dL (ref 1.5–4.5)
Potassium: 4 mmol/L (ref 3.5–5.2)
Sodium: 138 mmol/L (ref 134–144)
TOTAL PROTEIN: 7.6 g/dL (ref 6.0–8.5)

## 2017-06-18 LAB — HGB A1C W/O EAG: HEMOGLOBIN A1C: 6.7 % — AB (ref 4.8–5.6)

## 2017-06-18 LAB — LIPID PANEL W/O CHOL/HDL RATIO
Cholesterol, Total: 161 mg/dL (ref 100–199)
HDL: 53 mg/dL (ref >39)
LDL CALC: 93 mg/dL (ref 0–99)
Triglycerides: 74 mg/dL (ref 0–149)
VLDL CHOLESTEROL CAL: 15 mg/dL (ref 5–40)

## 2017-06-24 ENCOUNTER — Ambulatory Visit
Admission: RE | Admit: 2017-06-24 | Discharge: 2017-06-24 | Disposition: A | Discharge status: Home / Self Care | Payer: Medicare HMO | Source: Ambulatory Visit | Attending: Internal Medicine | Admitting: Internal Medicine

## 2017-06-24 DIAGNOSIS — Z780 Asymptomatic menopausal state: Secondary | ICD-10-CM

## 2017-06-26 ENCOUNTER — Encounter: Payer: Self-pay | Admitting: Physical Therapy

## 2017-06-26 ENCOUNTER — Other Ambulatory Visit: Payer: Self-pay

## 2017-06-26 ENCOUNTER — Ambulatory Visit: Payer: Medicare HMO | Attending: Internal Medicine | Admitting: Physical Therapy

## 2017-06-26 DIAGNOSIS — M542 Cervicalgia: Secondary | ICD-10-CM | POA: Insufficient documentation

## 2017-06-26 DIAGNOSIS — M25511 Pain in right shoulder: Secondary | ICD-10-CM | POA: Insufficient documentation

## 2017-06-26 DIAGNOSIS — R293 Abnormal posture: Secondary | ICD-10-CM | POA: Diagnosis present

## 2017-06-26 NOTE — Patient Instructions (Signed)
   SCM Stretch  1. With head in neutral position, place both hands around the front of your throat  2. Drag down slack skin towards your collar bones  3. Slowly tilt head back and look up  4. You should feel a strong stretch at the front of your neck  5. Hold position for 30 seconds, then return head to neutral position  6. Reset grip on skin and repeat 3 times, twice a day.      SCAPULAR RETRACTIONS  Draw your shoulder blades back and down.  It should feel like you are squeezing your shoulder blades together.  Hold for 2-3 seconds, then relax.  Repeat 10-15 times, 2-3 times per day.     SHOULDER ROLLS  Move your shoulders in a circular pattern as shown so that your are moving in an up, back and down direction. Perform small cicles if needed for comfort.  Repeat 10-15 times, 2-3 times per day.    RETRACTION / CHIN TUCK  Slowly draw your head back so that your ears line up with your shoulders.  Repeat 10-15 times, 2-3 times per day.

## 2017-06-26 NOTE — Therapy (Signed)
[facility] Outpatient Rehabilitation [facility] [address] [place], [place], [address] Phone: [phone]   Fax:  [phone]  Physical Therapy Evaluation  Patient Details  Name: April Shaffer MRN: [id number] Date of Birth: Aug 22, 1950 Referring Provider: Julieanne Manson    Encounter Date: 06/26/2017  PT End of Session - 06/26/17 1238    Visit Number  1    Number of Visits  13    Date for PT Re-Evaluation  07/24/17    Authorization Type  [employer] (PN due visit 10)    Authorization Time Period  06/26/17 to 08/07/17    PT Start Time  1102    PT Stop Time  1140    PT Time Calculation (min)  38 min    Activity Tolerance  Patient tolerated treatment well    Behavior During Therapy  [place] for tasks assessed/performed       Past Medical History:  Diagnosis Date  . Arthritis   . Carotid artery narrowing   . Coronary artery disease    Cardiac catheterization November 2013: 50% ostial LAD stenosis 50% mid stenosis. 30% disease in the left circumflex.  . Diabetes mellitus, type 2 ([facility])   . Hyperlipidemia   . Hypertension   . Onychomycosis of toenail 07/31/2016  . Sleep apnea    hx had tonsilectomy and no longer has     Past Surgical History:  Procedure Laterality Date  . CARDIAC CATHETERIZATION    . CAROTID ENDARTERECTOMY Right 09-27-11   cea  . ENDARTERECTOMY  09/27/2011   Procedure: RIGT ENDARTERECTOMY CAROTID;  Surgeon: Pryor Ochoa, MD;  Location: [facility] OR;  Service: Vascular;  Laterality: Right;  . EPIDURAL BLOCK INJECTION  02/2008   Drs. Dorthula Nettles  . gyn surgery  2004   total hysterectomy for mennorhagia,,salpingoophorectomy  . TONSILLECTOMY    . TOTAL ABDOMINAL HYSTERECTOMY W/ BILATERAL SALPINGOOPHORECTOMY Bilateral 2000  . VARICOSE VEIN SURGERY  2008   stripping    There were no vitals filed for this visit.   Subjective Assessment - 06/26/17 1102    Subjective  I woke up one morning and I could not turn my neck, I thought it was  because I left the window open at first. Its been about a week, my doctor put me in a brace and gave me muscle relaxants but I took myself out of the brace because it wasn't helping. No numbness or tingling, but I still cannot turn my head.     How long can you sit comfortably?  no limits     How long can you stand comfortably?  no limits     How long can you walk comfortably?  no limits     Patient Stated Goals  get my neck well so I can turn my head for driving     Currently in Pain?  Yes    Pain Score  5     Pain Location  Neck    Pain Orientation  Right    Pain Descriptors / Indicators  Aching;Sore    Pain Type  Acute pain    Pain Radiating Towards  into R shoulder     Pain Onset  In the past 7 days    Pain Frequency  Constant    Aggravating Factors   everything     Pain Relieving Factors  unsure     Effect of Pain on Daily Activities  severe         [facility] PT Assessment - 06/26/17  0001      Assessment   Medical Diagnosis  neck pain     Referring Provider  Julieanne Manson     Onset Date/Surgical Date  07/03/17    Next MD Visit  October 2019     Prior Therapy  none       Precautions   Precautions  None      Restrictions   Weight Bearing Restrictions  No      Balance Screen   Has the patient fallen in the past 6 months  No    Has the patient had a decrease in activity level because of a fear of falling?   No    Is the patient reluctant to leave their home because of a fear of falling?   No      Prior Function   Level of Independence  Independent;Independent with basic ADLs;Independent with transfers;Independent with gait    Vocation  Retired    Leisure  no hobbies       Observation/Other Assessments   Observations  empty can test (-) B; cervical compression and distraction (-)       ROM / Strength   AROM / PROM / Strength  AROM;Strength      AROM   AROM Assessment Site  Shoulder;Cervical;Thoracic    Right/Left Shoulder  Left;Right    Right Shoulder Flexion   -- WFL     Right Shoulder ABduction  -- [facility]     Right Shoulder Internal Rotation  -- T7    Right Shoulder External Rotation  -- C7    Left Shoulder Flexion  -- [facility]     Left Shoulder ABduction  -- [facility]     Left Shoulder Internal Rotation  -- T7    Left Shoulder External Rotation  -- T4     Cervical Flexion  50    Cervical Extension  25    Cervical - Right Side Bend  28    Cervical - Left Side Bend  32    Cervical - Right Rotation  60    Cervical - Left Rotation  70    Thoracic Flexion  WFL     Thoracic Extension  moderate limitation     Thoracic - Right Side Bend  WFL     Thoracic - Left Side Bend  moderate limitation     Thoracic - Right Rotation  mild limitation     Thoracic - Left Rotation  moderate limitation      Strength   Strength Assessment Site  Shoulder;Cervical    Right/Left Shoulder  Right;Left    Right Shoulder Flexion  4+/5    Right Shoulder ABduction  4/5    Right Shoulder Internal Rotation  4+/5    Right Shoulder External Rotation  4+/5    Left Shoulder Flexion  4+/5    Left Shoulder ABduction  4+/5    Left Shoulder Internal Rotation  4+/5    Left Shoulder External Rotation  4+/5    Cervical Flexion  5/5    Cervical Extension  5/5    Cervical - Right Side Bend  4+/5    Cervical - Left Side Bend  5/5      Palpation   Palpation comment  very sore and TTP R SCM muscle belly, otherwise signficant muscle tension and guarding noted                 Objective measurements completed on examination: See above findings.  [facility] Adult PT Treatment/Exercise - 06/26/17 0001      Manual Therapy   Manual Therapy  Soft tissue mobilization    Manual therapy comments  separate from all other skilled services     Soft tissue mobilization  R SCM in supine with head turned to L              PT Education - 06/26/17 1236    Education provided  Yes    Education Details  exam findings, prognosis, POC, HEP, anatomy of involved structures     Person(s)  Educated  Patient    Methods  Explanation;Handout    Comprehension  Verbalized understanding;Need further instruction       PT Short Term Goals - 06/26/17 1241      PT SHORT TERM GOAL #1   Title  Patient to be compliant with appropriate HEP, to be updated PRN     Time  1    Period  Weeks    Status  New    Target Date  07/03/17      PT SHORT TERM GOAL #2   Title  Patient to be able to maintain correct posture at least 70% of the time in order to improve postural mechanics and assist in reducing pain     Time  3    Period  Weeks    Status  New    Target Date  07/17/17      PT SHORT TERM GOAL #3   Title  Patient to report cervical pain as being no more than 2/10 pain at worst in order to improve QOL and tolerance to functional task performance     Time  3    Period  Weeks    Status  New        PT Long Term Goals - 06/26/17 1244      PT LONG TERM GOAL #1   Title  Patient to demonstrate full ROM in cervical and thoracic spines and in bilateral shoulders in order to assist in improving posture and functional mechanics     Time  6    Period  Weeks    Status  New    Target Date  08/07/17      PT LONG TERM GOAL #2   Title  Patient to report she has been able to fully turn her head to drive without exacerbation in pain in order to improve QOL and functional task performance     Time  6    Period  Weeks    Status  New      PT LONG TERM GOAL #3   Title  Patient to report full resolution of condition and will verbalize appropriate stretches and appropriate use of/positiioning with cervical pillow in order to prevent recurrence of condition     Time  6    Period  Weeks    Status  New             Plan - 06/26/17 1238    Clinical Impression Statement  Patient arrives with neck and shoulder pain of approximately 1 week in duration, she reports that she woke up with it, and bracing and muscle relaxers from her MD has not helped; she took herself out of the brace as it was  not helping at all. Examination reveals significant postural deviations, cervical and shoulder stiffness, apparent spasm in R SCM muscle belly as well as significant muscle tightness in upper traps and levators, and mild functional  strength deficits. She will benefit from skilled PT services in order to address functional deficits and reduce pain moving forward.     Clinical Presentation  Stable    Clinical Decision Making  Low    Clinical Impairments Affecting Rehab Potential  (+) acuity of pain, motivated to participate; (-) chronic postural impairments     PT Frequency  2x / week    PT Duration  6 weeks    PT Treatment/Interventions  ADLs/Self Care Home Management;Biofeedback;Cryotherapy;Electrical Stimulation;Iontophoresis /ml Dexamethasone;Moist Heat;Ultrasound;Therapeutic activities;Therapeutic exercise;Balance training;Neuromuscular re-education;Patient/family education;Manual techniques;Passive range of motion;Dry needling;Energy conservation;Taping    PT Next Visit Plan  review HEP and goals; cervical and thoracic mobility and postural training, STM to SCM as tolerated, cervical stretches, shoulder ROM and stretching     PT Home Exercise Plan  Eval: SCM stretch, chin tucks, scap retraction, shoulder rolls     Consulted and Agree with Plan of Care  Patient       Patient will benefit from skilled therapeutic intervention in order to improve the following deficits and impairments:  Increased fascial restricitons, Improper body mechanics, Pain, Decreased mobility, Increased muscle spasms, Postural dysfunction, Decreased activity tolerance, Decreased range of motion, Decreased strength, Hypomobility, Impaired UE functional use, Impaired flexibility  Visit Diagnosis: Cervicalgia - Plan: PT plan of care cert/re-cert  Abnormal posture - Plan: PT plan of care cert/re-cert  Acute pain of right shoulder - Plan: PT plan of care cert/re-cert     Problem List Patient Active Problem List    Diagnosis Date Noted  . Upper back pain on left side 10/30/2016  . Onychomycosis of toenail 07/31/2016  . Encounter for postoperative carotid endarterectomy surveillance 12/07/2015  . Varicosities of leg 06/03/2015  . Acute left lumbar radiculopathy 04/15/2014  . Chronic sciatica of right side 03/17/2013  . Diabetic peripheral neuropathy (HCC) 03/17/2013  . Coronary artery disease   . Carotid artery stenosis s/p CEA 2013 10/16/2011  . Carotid artery occlusion without infarction, right 09/17/2011  . Carotid bruit 08/28/2011  . Gout 04/13/2011  . DM type 2 with diabetic peripheral neuropathy (HCC) 04/02/2008  . Hyperlipidemia associated with type 2 diabetes mellitus (HCC) 04/02/2008  . Essential hypertension 04/02/2008  . DEGENERATIVE DISC DISEASE, LUMBOSACRAL SPINE 04/02/2008    Nedra Hai PT, DPT, CBIS  Supplemental Physical Therapist [facility]   Pager [phone]   [facility] Outpatient Rehabilitation [facility] [address] [place], [place], [address] Phone: [phone]   Fax:  [phone]  Name: April Shaffer MRN: [id number] Date of Birth: 14-Jun-1950

## 2017-07-01 ENCOUNTER — Encounter: Payer: Self-pay | Admitting: Physical Therapy

## 2017-07-01 ENCOUNTER — Other Ambulatory Visit: Payer: Self-pay

## 2017-07-01 ENCOUNTER — Ambulatory Visit: Payer: Medicare HMO | Admitting: Physical Therapy

## 2017-07-01 DIAGNOSIS — M25511 Pain in right shoulder: Secondary | ICD-10-CM

## 2017-07-01 DIAGNOSIS — M542 Cervicalgia: Secondary | ICD-10-CM | POA: Diagnosis not present

## 2017-07-01 DIAGNOSIS — I779 Disorder of arteries and arterioles, unspecified: Secondary | ICD-10-CM

## 2017-07-01 DIAGNOSIS — M1A0710 Idiopathic chronic gout, right ankle and foot, without tophus (tophi): Secondary | ICD-10-CM

## 2017-07-01 DIAGNOSIS — E1142 Type 2 diabetes mellitus with diabetic polyneuropathy: Secondary | ICD-10-CM

## 2017-07-01 DIAGNOSIS — I739 Peripheral vascular disease, unspecified: Principal | ICD-10-CM

## 2017-07-01 DIAGNOSIS — R293 Abnormal posture: Secondary | ICD-10-CM

## 2017-07-01 MED ORDER — AMLODIPINE BESYLATE 10 MG PO TABS: 10.0000 mg | ORAL_TABLET | Freq: Every day | ORAL | 11 refills | Status: DC

## 2017-07-01 MED ORDER — CARVEDILOL PHOSPHATE ER 20 MG PO CP24: ORAL_CAPSULE | ORAL | 11 refills | Status: DC

## 2017-07-01 MED ORDER — METFORMIN HCL ER 500 MG PO TB24: ORAL_TABLET | ORAL | 3 refills | Status: DC

## 2017-07-01 MED ORDER — CLOPIDOGREL BISULFATE 75 MG PO TABS: ORAL_TABLET | ORAL | 11 refills | Status: DC

## 2017-07-01 MED ORDER — GABAPENTIN 300 MG PO CAPS: ORAL_CAPSULE | ORAL | 11 refills | Status: DC

## 2017-07-01 MED ORDER — ATORVASTATIN CALCIUM 80 MG PO TABS: ORAL_TABLET | ORAL | 11 refills | Status: DC

## 2017-07-01 MED ORDER — ALLOPURINOL 300 MG PO TABS: 300.0000 mg | ORAL_TABLET | Freq: Every day | ORAL | 11 refills | Status: DC

## 2017-07-01 NOTE — Therapy (Signed)
[facility] Outpatient Rehabilitation [facility] [address] [place], [place], [address] Phone: [phone]   Fax:  [phone]  Physical Therapy Treatment  Patient Details  Name: April Shaffer MRN: [id number] Date of Birth: 1950/07/17 Referring Provider: Julieanne Manson    Encounter Date: 07/01/2017  PT End of Session - 07/01/17 0850    Visit Number  2    Number of Visits  13    Date for PT Re-Evaluation  07/24/17    Authorization Type  [employer] Medicare (PN due visit 10)    Authorization Time Period  06/26/17 to 08/07/17    PT Start Time  0846       Past Medical History:  Diagnosis Date  . Arthritis   . Carotid artery narrowing   . Coronary artery disease    Cardiac catheterization November 2013: 50% ostial LAD stenosis 50% mid stenosis. 30% disease in the left circumflex.  . Diabetes mellitus, type 2 (HCC)   . Hyperlipidemia   . Hypertension   . Onychomycosis of toenail 07/31/2016  . Sleep apnea    hx had tonsilectomy and no longer has     Past Surgical History:  Procedure Laterality Date  . CARDIAC CATHETERIZATION    . CAROTID ENDARTERECTOMY Right 09-27-11   cea  . ENDARTERECTOMY  09/27/2011   Procedure: RIGT ENDARTERECTOMY CAROTID;  Surgeon: Pryor Ochoa, MD;  Location: [facility] OR;  Service: Vascular;  Laterality: Right;  . EPIDURAL BLOCK INJECTION  02/2008   Drs. Dorthula Nettles  . gyn surgery  2004   total hysterectomy for mennorhagia,,salpingoophorectomy  . TONSILLECTOMY    . TOTAL ABDOMINAL HYSTERECTOMY W/ BILATERAL SALPINGOOPHORECTOMY Bilateral 2000  . VARICOSE VEIN SURGERY  2008   stripping    There were no vitals filed for this visit.  Subjective Assessment - 07/01/17 0848    Currently in Pain?  Yes    Pain Score  8     Pain Location  Neck    Pain Orientation  Right    Pain Descriptors / Indicators  -- stiffness    Aggravating Factors   turning, rolling over in bed, sleep positions     Pain Relieving Factors  muscles rubs                        [facility] Adult PT Treatment/Exercise - 07/01/17 0001      Neck Exercises: Seated   Neck Retraction  10 reps    Shoulder Rolls  10 reps;Backwards;Forwards      Neck Exercises: Supine   Neck Retraction  10 reps      Shoulder Exercises: Standing   Row  15 reps    Theraband Level (Shoulder Row)  Level 2 (Red)    Other Standing Exercises  serratus wall slides on pillow case x 15 cues for scapula       Shoulder Exercises: Pulleys   Flexion  1 minute      Shoulder Exercises: Stretch   Other Shoulder Stretches  book openings x 10 each side       Manual Therapy   Soft tissue mobilization  R SCM in supine with head turned to L , PROM side bend and rotation       Neck Exercises: Stretches   Corner Stretch  3 reps;30 seconds    Other Neck Stretches  SCM stretch 3 x 30              PT Education - 07/01/17 8413  Education provided  Yes    Education Details  HEP    Person(s) Educated  Patient    Methods  Explanation;Handout    Comprehension  Verbalized understanding       PT Short Term Goals - 06/26/17 1241      PT SHORT TERM GOAL #1   Title  Patient to be compliant with appropriate HEP, to be updated PRN     Time  1    Period  Weeks    Status  New    Target Date  07/03/17      PT SHORT TERM GOAL #2   Title  Patient to be able to maintain correct posture at least 70% of the time in order to improve postural mechanics and assist in reducing pain     Time  3    Period  Weeks    Status  New    Target Date  07/17/17      PT SHORT TERM GOAL #3   Title  Patient to report cervical pain as being no more than 2/10 pain at worst in order to improve QOL and tolerance to functional task performance     Time  3    Period  Weeks    Status  New        PT Long Term Goals - 06/26/17 1244      PT LONG TERM GOAL #1   Title  Patient to demonstrate full ROM in cervical and thoracic spines and in bilateral shoulders in order to assist in  improving posture and functional mechanics     Time  6    Period  Weeks    Status  New    Target Date  08/07/17      PT LONG TERM GOAL #2   Title  Patient to report she has been able to fully turn her head to drive without exacerbation in pain in order to improve QOL and functional task performance     Time  6    Period  Weeks    Status  New      PT LONG TERM GOAL #3   Title  Patient to report full resolution of condition and will verbalize appropriate stretches and appropriate use of/positiioning with cervical pillow in order to prevent recurrence of condition     Time  6    Period  Weeks    Status  New            Plan - 07/01/17 1610    Clinical Impression Statement  Pt reports continued pain with turning head and rolling overin bed. Reviewed HEP and continued shoulder and neck mobility. Updated HEP. Soft tissue and neck Prom performed. At end of session, pt reports feeling better.     PT Next Visit Plan  review HEP and goals; cervical and thoracic mobility and postural training, STM to SCM as tolerated, cervical stretches, shoulder ROM and stretching     PT Home Exercise Plan  Eval: SCM stretch, chin tucks, scap retraction, shoulder rolls , book openings, row with red band, doorway stretch    Consulted and Agree with Plan of Care  Patient       Patient will benefit from skilled therapeutic intervention in order to improve the following deficits and impairments:  Increased fascial restricitons, Improper body mechanics, Pain, Decreased mobility, Increased muscle spasms, Postural dysfunction, Decreased activity tolerance, Decreased range of motion, Decreased strength, Hypomobility, Impaired UE functional use, Impaired flexibility  Visit Diagnosis: Cervicalgia  Abnormal posture  Acute pain of right shoulder     Problem List Patient Active Problem List   Diagnosis Date Noted  . Upper back pain on left side 10/30/2016  . Onychomycosis of toenail 07/31/2016  . Encounter  for postoperative carotid endarterectomy surveillance 12/07/2015  . Varicosities of leg 06/03/2015  . Acute left lumbar radiculopathy 04/15/2014  . Chronic sciatica of right side 03/17/2013  . Diabetic peripheral neuropathy (HCC) 03/17/2013  . Coronary artery disease   . Carotid artery stenosis s/p CEA 2013 10/16/2011  . Carotid artery occlusion without infarction, right 09/17/2011  . Carotid bruit 08/28/2011  . Gout 04/13/2011  . DM type 2 with diabetic peripheral neuropathy (HCC) 04/02/2008  . Hyperlipidemia associated with type 2 diabetes mellitus (HCC) 04/02/2008  . Essential hypertension 04/02/2008  . DEGENERATIVE DISC DISEASE, LUMBOSACRAL SPINE 04/02/2008    Sherrie Mustache, PTA 07/01/2017, 9:49 AM  [facility] [address] [place], [place], [address] Phone: [phone]   Fax:  [phone]  Name: April Shaffer MRN: [id number] Date of Birth: 13-Jun-1950

## 2017-07-03 ENCOUNTER — Encounter: Payer: Self-pay | Admitting: Physical Therapy

## 2017-07-03 ENCOUNTER — Ambulatory Visit: Payer: Medicare HMO | Admitting: Physical Therapy

## 2017-07-03 DIAGNOSIS — M542 Cervicalgia: Secondary | ICD-10-CM | POA: Diagnosis not present

## 2017-07-03 DIAGNOSIS — R293 Abnormal posture: Secondary | ICD-10-CM

## 2017-07-03 DIAGNOSIS — M25511 Pain in right shoulder: Secondary | ICD-10-CM

## 2017-07-03 NOTE — Therapy (Signed)
[facility] Outpatient Rehabilitation [facility] [address] [place], [place], [address] Phone: [phone]   Fax:  [phone]  Physical Therapy Treatment  Patient Details  Name: April Shaffer MRN: [id number] Date of Birth: 19-May-1950 Referring Provider: Julieanne Manson    Encounter Date: 07/03/2017  PT End of Session - 07/03/17 0936    Visit Number  3    Number of Visits  13    Date for PT Re-Evaluation  07/24/17    Authorization Type  [employer] Medicare (PN due visit 10)    Authorization Time Period  06/26/17 to 08/07/17    PT Start Time  0845    PT Stop Time  0935    PT Time Calculation (min)  50 min       Past Medical History:  Diagnosis Date  . Arthritis   . Carotid artery narrowing   . Coronary artery disease    Cardiac catheterization November 2013: 50% ostial LAD stenosis 50% mid stenosis. 30% disease in the left circumflex.  . Diabetes mellitus, type 2 (HCC)   . Hyperlipidemia   . Hypertension   . Onychomycosis of toenail 07/31/2016  . Sleep apnea    hx had tonsilectomy and no longer has     Past Surgical History:  Procedure Laterality Date  . CARDIAC CATHETERIZATION    . CAROTID ENDARTERECTOMY Right 09-27-11   cea  . ENDARTERECTOMY  09/27/2011   Procedure: RIGT ENDARTERECTOMY CAROTID;  Surgeon: Pryor Ochoa, MD;  Location: [facility] OR;  Service: Vascular;  Laterality: Right;  . EPIDURAL BLOCK INJECTION  02/2008   Drs. Dorthula Nettles  . gyn surgery  2004   total hysterectomy for mennorhagia,,salpingoophorectomy  . TONSILLECTOMY    . TOTAL ABDOMINAL HYSTERECTOMY W/ BILATERAL SALPINGOOPHORECTOMY Bilateral 2000  . VARICOSE VEIN SURGERY  2008   stripping    There were no vitals filed for this visit.  Subjective Assessment - 07/03/17 0845    Subjective  Pt reports she is a little better     Currently in Pain?  Yes    Pain Score  7     Pain Location  Neck    Pain Orientation  Right    Pain Descriptors / Indicators  -- stiffness                         [facility] Adult PT Treatment/Exercise - 07/03/17 0001      Neck Exercises: Seated   Neck Retraction  10 reps    Cervical Rotation  10 reps    Lateral Flexion  10 reps    Shoulder Rolls  10 reps;Backwards;Forwards    Other Seated Exercise  scap retraction       Shoulder Exercises: Standing   Horizontal ABduction  10 reps    External Rotation  10 reps;Both    Row  15 reps    Theraband Level (Shoulder Row)  Level 2 (Red)    Other Standing Exercises  serratus wall slides on pillow case x 15 cues for scapula       Shoulder Exercises: Pulleys   Flexion  1 minute      Shoulder Exercises: Stretch   Other Shoulder Stretches  book openings x 10 each side       Modalities   Modalities  Moist Heat      Moist Heat Therapy   Number Minutes Moist Heat  10 Minutes    Moist Heat Location  Cervical  Manual Therapy   Soft tissue mobilization  R SCM in supine with head turned to L , PROM side bend and rotation       Neck Exercises: Stretches   Corner Stretch  3 reps;30 seconds    Other Neck Stretches  SCM stretch 3 x 30              PT Education - 07/03/17 0933    Education provided  Yes    Education Details  HEP    Person(s) Educated  Patient    Methods  Explanation;Handout    Comprehension  Verbalized understanding       PT Short Term Goals - 06/26/17 1241      PT SHORT TERM GOAL #1   Title  Patient to be compliant with appropriate HEP, to be updated PRN     Time  1    Period  Weeks    Status  New    Target Date  07/03/17      PT SHORT TERM GOAL #2   Title  Patient to be able to maintain correct posture at least 70% of the time in order to improve postural mechanics and assist in reducing pain     Time  3    Period  Weeks    Status  New    Target Date  07/17/17      PT SHORT TERM GOAL #3   Title  Patient to report cervical pain as being no more than 2/10 pain at worst in order to improve QOL and tolerance to functional task  performance     Time  3    Period  Weeks    Status  New        PT Long Term Goals - 06/26/17 1244      PT LONG TERM GOAL #1   Title  Patient to demonstrate full ROM in cervical and thoracic spines and in bilateral shoulders in order to assist in improving posture and functional mechanics     Time  6    Period  Weeks    Status  New    Target Date  08/07/17      PT LONG TERM GOAL #2   Title  Patient to report she has been able to fully turn her head to drive without exacerbation in pain in order to improve QOL and functional task performance     Time  6    Period  Weeks    Status  New      PT LONG TERM GOAL #3   Title  Patient to report full resolution of condition and will verbalize appropriate stretches and appropriate use of/positiioning with cervical pillow in order to prevent recurrence of condition     Time  6    Period  Weeks    Status  New            Plan - 07/03/17 0930    Clinical Impression Statement  Pt reports decreased pain with sleeping positions, turning in bed and cervical rotation. She reports her pain has moved from her SCM to her right upper trap/ levator. Progressed postural exercises and focused manaul and PROM to upper trap and levator. After soft tissue work and trigger point release, left Cervical PROM WNL.     PT Next Visit Plan  review HEP and goals; cervical and thoracic mobility and postural training, STM to SCM, upper trp, levator as tolerated, cervical stretches, shoulder ROM and stretching  PT Home Exercise Plan  Eval: SCM stretch, chin tucks, scap retraction, shoulder rolls , book openings, row with red band, doorway stretch, cervical rotation AROM and side bend AROM     Consulted and Agree with Plan of Care  Patient       Patient will benefit from skilled therapeutic intervention in order to improve the following deficits and impairments:  Increased fascial restricitons, Improper body mechanics, Pain, Decreased mobility, Increased muscle  spasms, Postural dysfunction, Decreased activity tolerance, Decreased range of motion, Decreased strength, Hypomobility, Impaired UE functional use, Impaired flexibility  Visit Diagnosis: Cervicalgia  Abnormal posture  Acute pain of right shoulder     Problem List Patient Active Problem List   Diagnosis Date Noted  . Upper back pain on left side 10/30/2016  . Onychomycosis of toenail 07/31/2016  . Encounter for postoperative carotid endarterectomy surveillance 12/07/2015  . Varicosities of leg 06/03/2015  . Acute left lumbar radiculopathy 04/15/2014  . Chronic sciatica of right side 03/17/2013  . Diabetic peripheral neuropathy (HCC) 03/17/2013  . Coronary artery disease   . Carotid artery stenosis s/p CEA 2013 10/16/2011  . Carotid artery occlusion without infarction, right 09/17/2011  . Carotid bruit 08/28/2011  . Gout 04/13/2011  . DM type 2 with diabetic peripheral neuropathy (HCC) 04/02/2008  . Hyperlipidemia associated with type 2 diabetes mellitus (HCC) 04/02/2008  . Essential hypertension 04/02/2008  . DEGENERATIVE DISC DISEASE, LUMBOSACRAL SPINE 04/02/2008    Sherrie Mustache, PTA 07/03/2017, 9:37 AM  [facility] [address] [place], [place], [address] Phone: [phone]   Fax:  [phone]  Name: April Shaffer MRN: [id number] Date of Birth: November 04, 1950

## 2017-07-03 NOTE — Patient Instructions (Signed)
AROM: Neck Rotation   Turn head slowly to look over one shoulder, then the other. Hold each position _5-10___ seconds. Repeat __5__ times per set. Do __2__ sets per session. Do __2__ sessions per day.  http://orth.exer.us/294   Copyright  VHI. All rights reserved.  AROM: Lateral Neck Flexion   Slowly tilt head toward one shoulder, then the other. Hold each position __5__ seconds. Repeat __5-10__ times per set. Do __2__ sets per session. Do __2__ sessions per day.

## 2017-07-10 ENCOUNTER — Ambulatory Visit: Payer: Medicare HMO | Admitting: Physical Therapy

## 2017-07-10 ENCOUNTER — Encounter: Payer: Self-pay | Admitting: Physical Therapy

## 2017-07-10 DIAGNOSIS — M25511 Pain in right shoulder: Secondary | ICD-10-CM

## 2017-07-10 DIAGNOSIS — M542 Cervicalgia: Secondary | ICD-10-CM | POA: Diagnosis not present

## 2017-07-10 DIAGNOSIS — R293 Abnormal posture: Secondary | ICD-10-CM

## 2017-07-10 NOTE — Patient Instructions (Signed)
   Scapular Retraction  Scapular Retraction  Start: Position your arm at 90 degrees by your side with Theraband in hand as pictured.  Movement: Against the resistance of the band, squeeze your shoulder blades together as you stick your chest out. Slow and controlled movement. return to start position.  *Note-you should not be pulling with your arms, this will only round your shoulders. The movement we want her is initiated by your shoulder blades, your arms are just holding the resistance.  Repeat 10-15 times, twice a day (morning and evening)    ELASTIC BAND EXTENSION BILATERAL SHOULDER  While holding an elastic band with both arms in front of you with your elbows straight, pull the band downwards and back towards your side.  Repeat 10-15 times, twice a day (morning and evening)    THORACIC EXTENSION STRETCH  You may use a rolled up towel instead of a foam roller that is shown in the picture.  Place the towel between your shoulder blades and lay flat on your back with the towel in place.  You should feel a stretch.  Stay in this position for at least 3-5 minutes, or longer if it feels comfortable.  Repeat 1-2 times per day.

## 2017-07-10 NOTE — Therapy (Signed)
[facility] Outpatient Rehabilitation [facility] [address] [place], [place], [address] Phone: [phone]   Fax:  [phone]  Physical Therapy Treatment  Patient Details  Name: April Shaffer MRN: [id number] Date of Birth: 1950-09-21 Referring Provider: Julieanne Manson    Encounter Date: 07/10/2017  PT End of Session - 07/10/17 1020    Visit Number  4    Number of Visits  13    Date for PT Re-Evaluation  07/24/17    Authorization Type  [employer] Medicare (PN due visit 10)    Authorization Time Period  06/26/17 to 08/07/17    PT Start Time  0937    PT Stop Time  1015    PT Time Calculation (min)  38 min    Activity Tolerance  Patient tolerated treatment well    Behavior During Therapy  [facility] for tasks assessed/performed       Past Medical History:  Diagnosis Date  . Arthritis   . Carotid artery narrowing   . Coronary artery disease    Cardiac catheterization November 2013: 50% ostial LAD stenosis 50% mid stenosis. 30% disease in the left circumflex.  . Diabetes mellitus, type 2 (HCC)   . Hyperlipidemia   . Hypertension   . Onychomycosis of toenail 07/31/2016  . Sleep apnea    hx had tonsilectomy and no longer has     Past Surgical History:  Procedure Laterality Date  . CARDIAC CATHETERIZATION    . CAROTID ENDARTERECTOMY Right 09-27-11   cea  . ENDARTERECTOMY  09/27/2011   Procedure: RIGT ENDARTERECTOMY CAROTID;  Surgeon: Pryor Ochoa, MD;  Location: [facility] OR;  Service: Vascular;  Laterality: Right;  . EPIDURAL BLOCK INJECTION  02/2008   Drs. Dorthula Nettles  . gyn surgery  2004   total hysterectomy for mennorhagia,,salpingoophorectomy  . TONSILLECTOMY    . TOTAL ABDOMINAL HYSTERECTOMY W/ BILATERAL SALPINGOOPHORECTOMY Bilateral 2000  . VARICOSE VEIN SURGERY  2008   stripping    There were no vitals filed for this visit.  Subjective Assessment - 07/10/17 0940    Subjective  I feel lke I'm getting better, but my pain is now in my shoulder instead  of my neck. The massages have helped. My exercise program is going well, it still challenges me.     Patient Stated Goals  get my neck well so I can turn my head for driving     Currently in Pain?  Yes    Pain Score  4     Pain Location  Shoulder    Pain Orientation  Right    Pain Descriptors / Indicators  Nagging    Pain Type  Acute pain    Pain Radiating Towards  none     Pain Onset  1 to 4 weeks ago    Pain Frequency  Intermittent    Aggravating Factors   rainy/cold weather, nothing else     Pain Relieving Factors  massages     Effect of Pain on Daily Activities  moderate                        [facility] Adult PT Treatment/Exercise - 07/10/17 0001      Exercises   Exercises  Neck      Neck Exercises: Theraband   Scapula Retraction  Red;10 reps    Rows  15 reps;Red    Other Theraband Exercises  shoulder extension red TB 1x15       Neck Exercises: Seated  Neck Retraction  15 reps    Cervical Rotation  10 reps 3 second holds for gentle stretch     Lateral Flexion  10 reps 3 second holds for gentle stretch     Other Seated Exercise  thoracic extensions 1x10 for postural stretch, thoracic rotation stretch sitting 5x10 seconds B       Neck Exercises: Supine   Other Supine Exercise  thoracic extension stretch over towel x3 minutes       STM R upper trap muscle x10 minutes        PT Education - 07/10/17 1019    Education provided  Yes    Education Details  HEP updates and form     Person(s) Educated  Patient    Methods  Explanation;Handout    Comprehension  Verbalized understanding;Returned demonstration       PT Short Term Goals - 06/26/17 1241      PT SHORT TERM GOAL #1   Title  Patient to be compliant with appropriate HEP, to be updated PRN     Time  1    Period  Weeks    Status  New    Target Date  07/03/17      PT SHORT TERM GOAL #2   Title  Patient to be able to maintain correct posture at least 70% of the time in order to improve postural  mechanics and assist in reducing pain     Time  3    Period  Weeks    Status  New    Target Date  07/17/17      PT SHORT TERM GOAL #3   Title  Patient to report cervical pain as being no more than 2/10 pain at worst in order to improve QOL and tolerance to functional task performance     Time  3    Period  Weeks    Status  New        PT Long Term Goals - 06/26/17 1244      PT LONG TERM GOAL #1   Title  Patient to demonstrate full ROM in cervical and thoracic spines and in bilateral shoulders in order to assist in improving posture and functional mechanics     Time  6    Period  Weeks    Status  New    Target Date  08/07/17      PT LONG TERM GOAL #2   Title  Patient to report she has been able to fully turn her head to drive without exacerbation in pain in order to improve QOL and functional task performance     Time  6    Period  Weeks    Status  New      PT LONG TERM GOAL #3   Title  Patient to report full resolution of condition and will verbalize appropriate stretches and appropriate use of/positiioning with cervical pillow in order to prevent recurrence of condition     Time  6    Period  Weeks    Status  New            Plan - 07/10/17 1020    Clinical Impression Statement  Patient arrives a few minutes late today, reports things seem to be going well with PT thus far. Continued working on functional postural exercises today, including interventions for thoracic spine mobility,  followed by STM to R upper trap and cervical musculature this session. Note difficulty with motor coordination especially with correct  form for exercises such as chin tucks.     Rehab Potential  Good    Clinical Impairments Affecting Rehab Potential  (+) acuity of pain, motivated to participate; (-) chronic postural impairments     PT Frequency  2x / week    PT Duration  6 weeks    PT Treatment/Interventions  ADLs/Self Care Home Management;Biofeedback;Cryotherapy;Electrical  Stimulation;Iontophoresis /ml Dexamethasone;Moist Heat;Ultrasound;Therapeutic activities;Therapeutic exercise;Balance training;Neuromuscular re-education;Patient/family education;Manual techniques;Passive range of motion;Dry needling;Energy conservation;Taping    PT Next Visit Plan  continue to work on cervical and thoracic mobility, postural training, STM as tolerated.     PT Home Exercise Plan  Eval: SCM stretch, chin tucks, scap retraction, shoulder rolls , book openings, row with red band, doorway stretch, cervical rotation AROM and side bend AROM, scap retraction with red TB, shoulder extension with red TB, thoracic extension over towel     Consulted and Agree with Plan of Care  Patient       Patient will benefit from skilled therapeutic intervention in order to improve the following deficits and impairments:  Increased fascial restricitons, Improper body mechanics, Pain, Decreased mobility, Increased muscle spasms, Postural dysfunction, Decreased activity tolerance, Decreased range of motion, Decreased strength, Hypomobility, Impaired UE functional use, Impaired flexibility  Visit Diagnosis: Cervicalgia  Abnormal posture  Acute pain of right shoulder     Problem List Patient Active Problem List   Diagnosis Date Noted  . Upper back pain on left side 10/30/2016  . Onychomycosis of toenail 07/31/2016  . Encounter for postoperative carotid endarterectomy surveillance 12/07/2015  . Varicosities of leg 06/03/2015  . Acute left lumbar radiculopathy 04/15/2014  . Chronic sciatica of right side 03/17/2013  . Diabetic peripheral neuropathy (HCC) 03/17/2013  . Coronary artery disease   . Carotid artery stenosis s/p CEA 2013 10/16/2011  . Carotid artery occlusion without infarction, right 09/17/2011  . Carotid bruit 08/28/2011  . Gout 04/13/2011  . DM type 2 with diabetic peripheral neuropathy (HCC) 04/02/2008  . Hyperlipidemia associated with type 2 diabetes mellitus (HCC) 04/02/2008   . Essential hypertension 04/02/2008  . DEGENERATIVE DISC DISEASE, LUMBOSACRAL SPINE 04/02/2008    Nedra Hai PT, DPT, CBIS  Supplemental Physical Therapist [facility]   Pager [phone]   [facility] Outpatient Rehabilitation [facility] [address] [place], [place], [address] Phone: [phone]   Fax:  [phone]  Name: April Shaffer MRN: [id number] Date of Birth: 1951-01-28

## 2017-07-12 ENCOUNTER — Encounter: Payer: Self-pay | Admitting: Physical Therapy

## 2017-07-12 ENCOUNTER — Ambulatory Visit: Payer: Medicare HMO | Admitting: Physical Therapy

## 2017-07-12 DIAGNOSIS — M542 Cervicalgia: Secondary | ICD-10-CM

## 2017-07-12 DIAGNOSIS — M25511 Pain in right shoulder: Secondary | ICD-10-CM

## 2017-07-12 DIAGNOSIS — R293 Abnormal posture: Secondary | ICD-10-CM

## 2017-07-12 NOTE — Therapy (Signed)
[facility]patient Rehabilitation [facility] [address] [place], [place], [address] Phone: [phone]   Fax:  [phone]  Physical Therapy Treatment  Patient Details  Name: April Shaffer MRN: [id number] Date of Birth: 12-Dec-1950 Referring Provider: Julieanne Manson    Encounter Date: 07/12/2017  PT End of Session - 07/12/17 1054    Visit Number  5    Number of Visits  13    Date for PT Re-Evaluation  07/24/17    Authorization Type  [employer] Medicare (PN due visit 10)    Authorization Time Period  06/26/17 to 08/07/17    PT Start Time  1016    PT Stop Time  1054    PT Time Calculation (min)  38 min    Activity Tolerance  Patient tolerated treatment well    Behavior During Therapy  [facility] for tasks assessed/performed       Past Medical History:  Diagnosis Date  . Arthritis   . Carotid artery narrowing   . Coronary artery disease    Cardiac catheterization November 2013: 50% ostial LAD stenosis 50% mid stenosis. 30% disease in the left circumflex.  . Diabetes mellitus, type 2 (HCC)   . Hyperlipidemia   . Hypertension   . Onychomycosis of toenail 07/31/2016  . Sleep apnea    hx had tonsilectomy and no longer has     Past Surgical History:  Procedure Laterality Date  . CARDIAC CATHETERIZATION    . CAROTID ENDARTERECTOMY Right 09-27-11   cea  . ENDARTERECTOMY  09/27/2011   Procedure: RIGT ENDARTERECTOMY CAROTID;  Surgeon: Pryor Ochoa, MD;  Location: [facility] OR;  Service: Vascular;  Laterality: Right;  . EPIDURAL BLOCK INJECTION  02/2008   Drs. Dorthula Nettles  . gyn surgery  2004   total hysterectomy for mennorhagia,,salpingoophorectomy  . TONSILLECTOMY    . TOTAL ABDOMINAL HYSTERECTOMY W/ BILATERAL SALPINGOOPHORECTOMY Bilateral 2000  . VARICOSE VEIN SURGERY  2008   stripping    There were no vitals filed for this visit.  Subjective Assessment - 07/12/17 1018    Subjective  I felt good after last session, the pain is still in my shoulder more than  my neck     Patient Stated Goals  get my neck well so I can turn my head for driving     Currently in Pain?  Yes    Pain Score  2     Pain Location  Shoulder    Pain Orientation  Right    Pain Descriptors / Indicators  Nagging                       [facility] Adult PT Treatment/Exercise - 07/12/17 0001      Neck Exercises: Theraband   Scapula Retraction  Green;15 reps    Other Theraband Exercises  shoulder extension green TB 1x15     Other Theraband Exercises  high pulls 1x10 red TB       Neck Exercises: Seated   Other Seated Exercise  thoracic excursions 1x15 all directions     Other Seated Exercise  thoracic rotation stretch 5x10 seconds B       Neck Exercises: Supine   Neck Retraction  15 reps;Other (comment) cues for form     Other Supine Exercise  thoracic extension stretch over towel x4 minutes; serratus punch 1x10 B 0#      Other Supine Exercise  chin tuck with rotation to either side 1x10 B; chin tuck with B UE  fllexion ;  shoulder ABD 1x10 with chin tuck       Manual Therapy   Manual Therapy  Soft tissue mobilization    Manual therapy comments  separate from all other skilled services     Soft tissue mobilization  R upper trap              PT Education - 07/12/17 1053    Education provided  Yes    Education Details  progress thus far, POC moving forward     Person(s) Educated  Patient    Methods  Explanation    Comprehension  Verbalized understanding       PT Short Term Goals - 06/26/17 1241      PT SHORT TERM GOAL #1   Title  Patient to be compliant with appropriate HEP, to be updated PRN     Time  1    Period  Weeks    Status  New    Target Date  07/03/17      PT SHORT TERM GOAL #2   Title  Patient to be able to maintain correct posture at least 70% of the time in order to improve postural mechanics and assist in reducing pain     Time  3    Period  Weeks    Status  New    Target Date  07/17/17      PT SHORT TERM GOAL #3   Title   Patient to report cervical pain as being no more than 2/10 pain at worst in order to improve QOL and tolerance to functional task performance     Time  3    Period  Weeks    Status  New        PT Long Term Goals - 06/26/17 1244      PT LONG TERM GOAL #1   Title  Patient to demonstrate full ROM in cervical and thoracic spines and in bilateral shoulders in order to assist in improving posture and functional mechanics     Time  6    Period  Weeks    Status  New    Target Date  08/07/17      PT LONG TERM GOAL #2   Title  Patient to report she has been able to fully turn her head to drive without exacerbation in pain in order to improve QOL and functional task performance     Time  6    Period  Weeks    Status  New      PT LONG TERM GOAL #3   Title  Patient to report full resolution of condition and will verbalize appropriate stretches and appropriate use of/positiioning with cervical pillow in order to prevent recurrence of condition     Time  6    Period  Weeks    Status  New            Plan - 07/12/17 1054    Clinical Impression Statement  Continued working on thoracic mobility, especially extension, due to ongoing postural deficits including significant thoracic kyphosis. Otherwise continued with postural training and strengthening, introduced chin tucks in supine today due to difficulty with proprioception and correct form with this exercise. Finished session with STM to area of painful musculature today, patient continues to do well with skilled PT services moving forward.     Rehab Potential  Good    Clinical Impairments Affecting Rehab Potential  (+) acuity of pain, motivated to participate; (-)  chronic postural impairments     PT Frequency  2x / week    PT Duration  6 weeks    PT Treatment/Interventions  ADLs/Self Care Home Management;Biofeedback;Cryotherapy;Electrical Stimulation;Iontophoresis /ml Dexamethasone;Moist Heat;Ultrasound;Therapeutic activities;Therapeutic  exercise;Balance training;Neuromuscular re-education;Patient/family education;Manual techniques;Passive range of motion;Dry needling;Energy conservation;Taping    PT Next Visit Plan  one more treatment session, then re-assess/possible DC on Wednesday 5/22    PT Home Exercise Plan  Eval: SCM stretch, chin tucks, scap retraction, shoulder rolls , book openings, row with red band, doorway stretch, cervical rotation AROM and side bend AROM, scap retraction with red TB, shoulder extension with red TB, thoracic extension over towel     Consulted and Agree with Plan of Care  Patient       Patient will benefit from skilled therapeutic intervention in order to improve the following deficits and impairments:  Increased fascial restricitons, Improper body mechanics, Pain, Decreased mobility, Increased muscle spasms, Postural dysfunction, Decreased activity tolerance, Decreased range of motion, Decreased strength, Hypomobility, Impaired UE functional use, Impaired flexibility  Visit Diagnosis: Cervicalgia  Abnormal posture  Acute pain of right shoulder     Problem List Patient Active Problem List   Diagnosis Date Noted  . Upper back pain on left side 10/30/2016  . Onychomycosis of toenail 07/31/2016  . Encounter for postoperative carotid endarterectomy surveillance 12/07/2015  . Varicosities of leg 06/03/2015  . Acute left lumbar radiculopathy 04/15/2014  . Chronic sciatica of right side 03/17/2013  . Diabetic peripheral neuropathy (HCC) 03/17/2013  . Coronary artery disease   . Carotid artery stenosis s/p CEA 2013 10/16/2011  . Carotid artery occlusion without infarction, right 09/17/2011  . Carotid bruit 08/28/2011  . Gout 04/13/2011  . DM type 2 with diabetic peripheral neuropathy (HCC) 04/02/2008  . Hyperlipidemia associated with type 2 diabetes mellitus (HCC) 04/02/2008  . Essential hypertension 04/02/2008  . DEGENERATIVE DISC DISEASE, LUMBOSACRAL SPINE 04/02/2008    Nedra Hai  PT, DPT, CBIS  Supplemental Physical Therapist [facility]   Pager [phone]   [facility] Outpatient Rehabilitation [facility] [address] [place], [place], [address] Phone: [phone]   Fax:  [phone]  Name: April Shaffer MRN: [id number] Date of Birth: 03/25/1950

## 2017-07-15 ENCOUNTER — Encounter: Payer: Self-pay | Admitting: Physical Therapy

## 2017-07-15 ENCOUNTER — Ambulatory Visit: Payer: Medicare HMO | Admitting: Physical Therapy

## 2017-07-15 DIAGNOSIS — M542 Cervicalgia: Secondary | ICD-10-CM

## 2017-07-15 DIAGNOSIS — M25511 Pain in right shoulder: Secondary | ICD-10-CM

## 2017-07-15 DIAGNOSIS — R293 Abnormal posture: Secondary | ICD-10-CM

## 2017-07-15 NOTE — Therapy (Signed)
[place], [place], [address] Phone: [phone]   Fax:  [phone]  Physical Therapy Treatment  Patient Details  Name: April Shaffer MRN: [id number] Date of Birth: August 12, 1950 Referring Provider: Mack Hook    Encounter Date: 07/15/2017  PT End of Session - 07/15/17 0935    Visit Number  6    Number of Visits  13    Date for PT Re-Evaluation  07/24/17    Authorization Type  [employer] Medicare (PN due visit 10)    Authorization Time Period  06/26/17 to 08/07/17    PT Start Time  0930    PT Stop Time  1012    PT Time Calculation (min)  42 min       Past Medical History:  Diagnosis Date  . Arthritis   . Carotid artery narrowing   . Coronary artery disease    Cardiac catheterization November 2013: 50% ostial LAD stenosis 50% mid stenosis. 30% disease in the left circumflex.  . Diabetes mellitus, type 2 ([place])   . Hyperlipidemia   . Hypertension   . Onychomycosis of toenail 07/31/2016  . Sleep apnea    hx had tonsilectomy and no longer has     Past Surgical History:  Procedure Laterality Date  . CARDIAC CATHETERIZATION    . CAROTID ENDARTERECTOMY Right 09-27-11   cea  . ENDARTERECTOMY  09/27/2011   Procedure: RIGT ENDARTERECTOMY CAROTID;  Surgeon: Mal Misty, MD;  Location: [facility];  Service: Vascular;  Laterality: Right;  . EPIDURAL BLOCK INJECTION  02/2008   Drs. Eulis Manly  . gyn surgery  2004   total hysterectomy for mennorhagia,,salpingoophorectomy  . TONSILLECTOMY    . TOTAL ABDOMINAL HYSTERECTOMY W/ BILATERAL SALPINGOOPHORECTOMY Bilateral 2000  . VARICOSE VEIN SURGERY  2008   stripping    There were no vitals filed for this visit.  Subjective Assessment - 07/15/17 0934    Subjective  Feeling good. just a little pain.     Currently in Pain?  Yes    Pain Score  1     Pain Location  Shoulder    Pain Orientation  Right;Anterior    Pain Descriptors / Indicators  Nagging    Pain Type  Acute pain    Aggravating Factors   air conditioning     Pain Relieving Factors  heat, warm weather                        [facility] Adult PT Treatment/Exercise - 07/15/17 0001      Neck Exercises: Theraband   Scapula Retraction  Green;20 reps    Other Theraband Exercises  shoulder extension green TB 1x120      Neck Exercises: Seated   Neck Retraction  15 reps    Lateral Flexion  10 reps 3 second holds for gentle stretch     Other Seated Exercise  thoracic extensions x 10 cues for technique     Other Seated Exercise  thoracic rotation stretch 5x10 seconds B       Neck Exercises: Supine   Other Supine Exercise  thoracic extension stretch over towel x4 minutes; serratus punch 1x10 B 0#      Other Supine Exercise  chin tuck with horizontal abductions, Alternating UE flex/ext, arm circles( towel roll under head)      Shoulder Exercises: Stretch   Other Shoulder Stretches  book openings x 10 each side  Manual Therapy   Soft tissue mobilization  R upper trap                PT Short Term Goals - 07/15/17 0935      PT SHORT TERM GOAL #1   Title  Patient to be compliant with appropriate HEP, to be updated PRN     Time  3    Period  Weeks    Status  Achieved      PT SHORT TERM GOAL #2   Title  Patient to be able to maintain correct posture at least 70% of the time in order to improve postural mechanics and assist in reducing pain     Baseline  more minful at home, min cues in clinic    Time  3    Period  Weeks    Status  Achieved      PT SHORT TERM GOAL #3   Title  Patient to report cervical pain as being no more than 2/10 pain at worst in order to improve QOL and tolerance to functional task performance     Time  3    Period  Weeks    Status  Achieved        PT Long Term Goals - 07/15/17 4332      PT LONG TERM GOAL #1   Title  Patient to demonstrate full ROM in cervical and thoracic spines and in bilateral shoulders in order to assist  in improving posture and functional mechanics     Time  6    Period  Weeks    Status  Unable to assess      PT LONG TERM GOAL #2   Title  Patient to report she has been able to fully turn her head to drive without exacerbation in pain in order to improve QOL and functional task performance     Baseline  no pain with driving     Time  6    Period  Weeks    Status  Achieved      PT LONG TERM GOAL #3   Title  Patient to report full resolution of condition and will verbalize appropriate stretches and appropriate use of/positiioning with cervical pillow in order to prevent recurrence of condition     Time  6    Period  Weeks    Status  Achieved            Plan - 07/15/17 0957    Clinical Impression Statement  Improving motor control with chin tuck exercises in supine today. Continued thoracic extension and rotation ROM as well as postural strength. Pt reports no pain with driving and pain has been 1/10 at most lately. She is minful of her posture at home and using Lumbar Roll. She has met all STGS.  Will assess ROM and discharge next visit.     PT Next Visit Plan  re-assess/possible DC on Wednesday 5/22    PT Home Exercise Plan  Eval: SCM stretch, chin tucks, scap retraction, shoulder rolls , book openings, row with red band, doorway stretch, cervical rotation AROM and side bend AROM, scap retraction with red TB, shoulder extension with red TB, thoracic extension over towel     Consulted and Agree with Plan of Care  Patient       Patient will benefit from skilled therapeutic intervention in order to improve the following deficits and impairments:  Increased fascial restricitons, Improper body mechanics, Pain, Decreased mobility, Increased muscle spasms,  Postural dysfunction, Decreased activity tolerance, Decreased range of motion, Decreased strength, Hypomobility, Impaired UE functional use, Impaired flexibility  Visit Diagnosis: Cervicalgia  Abnormal posture  Acute pain of right  shoulder     Problem List Patient Active Problem List   Diagnosis Date Noted  . Upper back pain on left side 10/30/2016  . Onychomycosis of toenail 07/31/2016  . Encounter for postoperative carotid endarterectomy surveillance 12/07/2015  . Varicosities of leg 06/03/2015  . Acute left lumbar radiculopathy 04/15/2014  . Chronic sciatica of right side 03/17/2013  . Diabetic peripheral neuropathy ([place]) 03/17/2013  . Coronary artery disease   . Carotid artery stenosis s/p CEA 2013 10/16/2011  . Carotid artery occlusion without infarction, right 09/17/2011  . Carotid bruit 08/28/2011  . Gout 04/13/2011  . DM type 2 with diabetic peripheral neuropathy ([place]) 04/02/2008  . Hyperlipidemia associated with type 2 diabetes mellitus ([place]) 04/02/2008  . Essential hypertension 04/02/2008  . DEGENERATIVE DISC DISEASE, LUMBOSACRAL SPINE 04/02/2008    Dorene Ar, PTA 07/15/2017, 10:14 AM  [place], [place], [address] Phone: [phone]   Fax:  [phone]  Name: April Shaffer MRN: [id number] Date of Birth: 1951/02/11

## 2017-07-17 ENCOUNTER — Ambulatory Visit: Payer: Medicare HMO | Admitting: Physical Therapy

## 2017-07-17 ENCOUNTER — Encounter: Payer: Self-pay | Admitting: Physical Therapy

## 2017-07-17 DIAGNOSIS — M542 Cervicalgia: Secondary | ICD-10-CM

## 2017-07-17 DIAGNOSIS — R293 Abnormal posture: Secondary | ICD-10-CM

## 2017-07-17 DIAGNOSIS — M25511 Pain in right shoulder: Secondary | ICD-10-CM

## 2017-07-17 NOTE — Therapy (Addendum)
[place], [place], [address] Phone: [phone]   Fax:  [phone]  Physical Therapy Treatment (discharge)  Patient Details  Name: April Shaffer MRN: [id number] Date of Birth: 15-May-1950 Referring Provider: Mack Hook    Encounter Date: 07/17/2017   PHYSICAL THERAPY DISCHARGE SUMMARY  Visits from Start of Care: 7  Current functional level related to goals / functional outcomes: Patient with all symptoms resolved and at her functional baseline at this time. DC today.    Remaining deficits: See below    Education / Equipment: See below  Plan: Patient agrees to discharge.  Patient goals were met. Patient is being discharged due to meeting the stated rehab goals.  ?????     Deniece Ree PT, DPT, CBIS  Supplemental Physical Therapist [facility]   Pager [phone]    PT End of Session - 07/17/17 0933    Visit Number  7    Number of Visits  13    Date for PT Re-Evaluation  07/24/17    Authorization Type  [employer] Medicare (PN due visit 10)    Authorization Time Period  06/26/17 to 08/07/17    PT Start Time  0930    PT Stop Time  1000 discharged    PT Time Calculation (min)  30 min       Past Medical History:  Diagnosis Date  . Arthritis   . Carotid artery narrowing   . Coronary artery disease    Cardiac catheterization November 2013: 50% ostial LAD stenosis 50% mid stenosis. 30% disease in the left circumflex.  . Diabetes mellitus, type 2 ([place])   . Hyperlipidemia   . Hypertension   . Onychomycosis of toenail 07/31/2016  . Sleep apnea    hx had tonsilectomy and no longer has     Past Surgical History:  Procedure Laterality Date  . CARDIAC CATHETERIZATION    . CAROTID ENDARTERECTOMY Right 09-27-11   cea  . ENDARTERECTOMY  09/27/2011   Procedure: RIGT ENDARTERECTOMY CAROTID;  Surgeon: Mal Misty, MD;  Location: [facility];  Service: Vascular;  Laterality: Right;  . EPIDURAL  BLOCK INJECTION  02/2008   Drs. Eulis Manly  . gyn surgery  2004   total hysterectomy for mennorhagia,,salpingoophorectomy  . TONSILLECTOMY    . TOTAL ABDOMINAL HYSTERECTOMY W/ BILATERAL SALPINGOOPHORECTOMY Bilateral 2000  . VARICOSE VEIN SURGERY  2008   stripping    There were no vitals filed for this visit.  Subjective Assessment - 07/17/17 1001    Subjective  Very little pain. Can drive with no problem now.     Currently in Pain?  No/denies    Pain Score  0-No pain         [facility] PT Assessment - 07/17/17 0001      Observation/Other Assessments   Focus on Therapeutic Outcomes (FOTO)   4% limited improved from 29% on intake      AROM   Right Shoulder Flexion  -- [facility]     Right Shoulder ABduction  -- [facility]     Right Shoulder Internal Rotation  -- T7    Right Shoulder External Rotation  -- T4    Left Shoulder Flexion  -- [facility]     Left Shoulder ABduction  -- [facility]     Left Shoulder Internal Rotation  -- T7    Left Shoulder External Rotation  -- T4     Cervical Flexion  50    Cervical Extension  50  Cervical - Right Side Bend  40    Cervical - Left Side Bend  42    Cervical - Right Rotation  55    Cervical - Left Rotation  55    Thoracic Flexion  WFL     Thoracic Extension  moderate limitation     Thoracic - Right Side Bend  WFL     Thoracic - Left Side Bend  mild limitation    Thoracic - Right Rotation  mild limitation     Thoracic - Left Rotation  mild limitation                   [facility] Adult PT Treatment/Exercise - 07/17/17 0001      Neck Exercises: Theraband   Scapula Retraction  Green;20 reps    Other Theraband Exercises  shoulder extension green TB 1x120      Neck Exercises: Seated   Neck Retraction  15 reps    Cervical Rotation  10 reps 3 second holds for gentle stretch     Lateral Flexion  10 reps 3 second holds for gentle stretch     Other Seated Exercise  thoracic extensions x 10 cues for technique     Other Seated Exercise  thoracic  rotation stretch 5x10 seconds B       Neck Exercises: Supine   Neck Retraction  15 reps;Other (comment) on towel roll    Other Supine Exercise  thoracic extension stretch over towel x4 minutes; serratus punch 1x10 B 0#      Other Supine Exercise  chin tuck with horizontal abductions, Alternating UE flex/ext, arm circles( towel roll under head)      Shoulder Exercises: Stretch   Other Shoulder Stretches  book openings x 10 each side              PT Education - 07/17/17 0957    Education Details  Upgrade to green bands     Person(s) Educated  Patient    Methods  Explanation    Comprehension  Verbalized understanding       PT Short Term Goals - 07/15/17 0935      PT SHORT TERM GOAL #1   Title  Patient to be compliant with appropriate HEP, to be updated PRN     Time  3    Period  Weeks    Status  Achieved      PT SHORT TERM GOAL #2   Title  Patient to be able to maintain correct posture at least 70% of the time in order to improve postural mechanics and assist in reducing pain     Baseline  more minful at home, min cues in clinic    Time  3    Period  Weeks    Status  Achieved      PT SHORT TERM GOAL #3   Title  Patient to report cervical pain as being no more than 2/10 pain at worst in order to improve QOL and tolerance to functional task performance     Time  3    Period  Weeks    Status  Achieved        PT Long Term Goals - 07/17/17 6010      PT LONG TERM GOAL #1   Title  Patient to demonstrate full ROM in cervical and thoracic spines and in bilateral shoulders in order to assist in improving posture and functional mechanics     Baseline  see objective measures  Time  6    Period  Weeks    Status  Partially Met      PT LONG TERM GOAL #2   Title  Patient to report she has been able to fully turn her head to drive without exacerbation in pain in order to improve QOL and functional task performance     Baseline  no pain with driving     Time  6    Period   Weeks    Status  Achieved      PT LONG TERM GOAL #3   Title  Patient to report full resolution of condition and will verbalize appropriate stretches and appropriate use of/positiioning with cervical pillow in order to prevent recurrence of condition     Baseline  900-100% improved     Time  6    Status  Achieved            Plan - 07/17/17 0945    Clinical Impression Statement  Pt reports 90-100% improvement in pain. She is able to drive without difficulty. Her cervical and thoracic mobility has improved. Shoulder AROM improved and WFL bilateral. All goals met or partially met. Pt was given green band for progression. Pt is pleased with current function and agreeable to discharge today.     PT Next Visit Plan  discharge today     PT Home Exercise Plan  Eval: SCM stretch, chin tucks, scap retraction, shoulder rolls , book openings, row with red band, doorway stretch, cervical rotation AROM and side bend AROM, scap retraction with red TB, shoulder extension with red TB, thoracic extension over towel     Consulted and Agree with Plan of Care  Patient       Patient will benefit from skilled therapeutic intervention in order to improve the following deficits and impairments:  Increased fascial restricitons, Improper body mechanics, Pain, Decreased mobility, Increased muscle spasms, Postural dysfunction, Decreased activity tolerance, Decreased range of motion, Decreased strength, Hypomobility, Impaired UE functional use, Impaired flexibility  Visit Diagnosis: Cervicalgia  Abnormal posture  Acute pain of right shoulder     Problem List Patient Active Problem List   Diagnosis Date Noted  . Upper back pain on left side 10/30/2016  . Onychomycosis of toenail 07/31/2016  . Encounter for postoperative carotid endarterectomy surveillance 12/07/2015  . Varicosities of leg 06/03/2015  . Acute left lumbar radiculopathy 04/15/2014  . Chronic sciatica of right side 03/17/2013  . Diabetic  peripheral neuropathy ([place]) 03/17/2013  . Coronary artery disease   . Carotid artery stenosis s/p CEA 2013 10/16/2011  . Carotid artery occlusion without infarction, right 09/17/2011  . Carotid bruit 08/28/2011  . Gout 04/13/2011  . DM type 2 with diabetic peripheral neuropathy ([place]) 04/02/2008  . Hyperlipidemia associated with type 2 diabetes mellitus ([place]) 04/02/2008  . Essential hypertension 04/02/2008  . DEGENERATIVE DISC DISEASE, LUMBOSACRAL SPINE 04/02/2008    Dorene Ar, [place] 07/17/2017, 10:04 AM  [place], [place], [address] Phone: [phone]   Fax:  [phone]  Name: April Shaffer MRN: [id number] Date of Birth: 08/23/1950

## 2017-07-23 ENCOUNTER — Ambulatory Visit: Payer: Medicare HMO | Admitting: Physical Therapy

## 2017-07-25 ENCOUNTER — Ambulatory Visit: Payer: Medicare HMO | Admitting: Physical Therapy

## 2017-07-29 ENCOUNTER — Encounter: Payer: Medicare HMO | Admitting: Physical Therapy

## 2017-07-31 ENCOUNTER — Encounter: Payer: Medicare HMO | Admitting: Physical Therapy

## 2017-08-05 ENCOUNTER — Encounter: Payer: Medicare HMO | Admitting: Physical Therapy

## 2017-08-07 ENCOUNTER — Encounter: Payer: Medicare HMO | Admitting: Physical Therapy

## 2017-10-01 ENCOUNTER — Other Ambulatory Visit (INDEPENDENT_AMBULATORY_CARE_PROVIDER_SITE_OTHER): Payer: Medicare HMO

## 2017-10-01 DIAGNOSIS — E782 Mixed hyperlipidemia: Secondary | ICD-10-CM

## 2017-10-02 LAB — LIPID PANEL W/O CHOL/HDL RATIO
CHOLESTEROL TOTAL: 159 mg/dL (ref 100–199)
HDL: 51 mg/dL (ref >39)
LDL CALC: 93 mg/dL (ref 0–99)
Triglycerides: 73 mg/dL (ref 0–149)
VLDL Cholesterol Cal: 15 mg/dL (ref 5–40)

## 2017-11-18 ENCOUNTER — Emergency Department (HOSPITAL_COMMUNITY): Payer: Medicare HMO

## 2017-11-18 ENCOUNTER — Encounter (HOSPITAL_COMMUNITY): Payer: Self-pay | Admitting: *Deleted

## 2017-11-18 ENCOUNTER — Other Ambulatory Visit: Payer: Self-pay

## 2017-11-18 ENCOUNTER — Emergency Department (HOSPITAL_COMMUNITY)
Admission: EM | Admit: 2017-11-18 | Discharge: 2017-11-18 | Disposition: A | Discharge status: Home / Self Care | Payer: Medicare HMO | Attending: Emergency Medicine | Admitting: Emergency Medicine

## 2017-11-18 DIAGNOSIS — Y939 Activity, unspecified: Secondary | ICD-10-CM | POA: Diagnosis not present

## 2017-11-18 DIAGNOSIS — Z7984 Long term (current) use of oral hypoglycemic drugs: Secondary | ICD-10-CM | POA: Insufficient documentation

## 2017-11-18 DIAGNOSIS — S0083XA Contusion of other part of head, initial encounter: Secondary | ICD-10-CM | POA: Diagnosis not present

## 2017-11-18 DIAGNOSIS — W19XXXA Unspecified fall, initial encounter: Secondary | ICD-10-CM

## 2017-11-18 DIAGNOSIS — Y999 Unspecified external cause status: Secondary | ICD-10-CM | POA: Insufficient documentation

## 2017-11-18 DIAGNOSIS — I10 Essential (primary) hypertension: Secondary | ICD-10-CM | POA: Insufficient documentation

## 2017-11-18 DIAGNOSIS — Z7982 Long term (current) use of aspirin: Secondary | ICD-10-CM | POA: Insufficient documentation

## 2017-11-18 DIAGNOSIS — Y929 Unspecified place or not applicable: Secondary | ICD-10-CM | POA: Insufficient documentation

## 2017-11-18 DIAGNOSIS — W01198A Fall on same level from slipping, tripping and stumbling with subsequent striking against other object, initial encounter: Secondary | ICD-10-CM | POA: Diagnosis not present

## 2017-11-18 DIAGNOSIS — M546 Pain in thoracic spine: Secondary | ICD-10-CM | POA: Insufficient documentation

## 2017-11-18 DIAGNOSIS — Z7902 Long term (current) use of antithrombotics/antiplatelets: Secondary | ICD-10-CM | POA: Diagnosis not present

## 2017-11-18 DIAGNOSIS — I2510 Atherosclerotic heart disease of native coronary artery without angina pectoris: Secondary | ICD-10-CM | POA: Diagnosis not present

## 2017-11-18 DIAGNOSIS — F17210 Nicotine dependence, cigarettes, uncomplicated: Secondary | ICD-10-CM | POA: Insufficient documentation

## 2017-11-18 DIAGNOSIS — E119 Type 2 diabetes mellitus without complications: Secondary | ICD-10-CM | POA: Insufficient documentation

## 2017-11-18 DIAGNOSIS — S0993XA Unspecified injury of face, initial encounter: Secondary | ICD-10-CM | POA: Diagnosis present

## 2017-11-18 MED ORDER — ACETAMINOPHEN 500 MG PO TABS
500.0000 mg | ORAL_TABLET | Freq: Once | ORAL | Status: AC
  Administered 2017-11-18: 500 mg via ORAL
  Filled 2017-11-18: qty 1

## 2017-11-18 NOTE — ED Triage Notes (Signed)
Tripped on a rug and fell approx 2 hours ago   Rt face painb and lower back pain

## 2017-11-18 NOTE — Discharge Instructions (Addendum)
As discussed, your evaluation today has been largely reassuring.  But, it is important that you monitor your condition carefully, and do not hesitate to return to the ED if you develop new, or concerning changes in your condition.  In addition to Tylenol, please obtain topical pain medication patches including the ingredient dimethyl salicylate for pain control in your mid back.  Otherwise, please follow-up with your physician for appropriate ongoing care.

## 2017-11-18 NOTE — ED Notes (Signed)
Transported to XRAY  

## 2017-11-18 NOTE — ED Notes (Signed)
Updated on wait for treatment room. 

## 2017-11-18 NOTE — ED Provider Notes (Signed)
[facility] EMERGENCY DEPARTMENT Provider Note   CSN: [id number] Arrival date & time: 11/18/17  0039     History   Chief Complaint Chief Complaint  Patient presents with  . Fall    HPI April Shaffer is a 67 y.o. female.  HPI Patient presents after fall. Fall occurred 10 hours ago. Patient recalls walking, tripping on a rug, falling forward striking her face, and subsequently developing severe sharp pain in the mid thoracic spine. No facial pain, no malocclusion, no difficulty with chewing, speaking. No loss of consciousness. She actually has no head or neck pain.  On how she does, have persistent pain in the thoracic spine, described as sharp, worse with activity, twisting, ambulation. No hip pain, no difficulty with ambulation itself. She has taken no medications since the fall.  Past Medical History:  Diagnosis Date  . Arthritis   . Carotid artery narrowing   . Coronary artery disease    Cardiac catheterization November 2013: 50% ostial LAD stenosis 50% mid stenosis. 30% disease in the left circumflex.  . Diabetes mellitus, type 2 ([place])   . Hyperlipidemia   . Hypertension   . Onychomycosis of toenail 07/31/2016  . Sleep apnea    hx had tonsilectomy and no longer has     Patient Active Problem List   Diagnosis Date Noted  . Upper back pain on left side 10/30/2016  . Onychomycosis of toenail 07/31/2016  . Encounter for postoperative carotid endarterectomy surveillance 12/07/2015  . Varicosities of leg 06/03/2015  . Acute left lumbar radiculopathy 04/15/2014  . Chronic sciatica of right side 03/17/2013  . Diabetic peripheral neuropathy ([place]) 03/17/2013  . Coronary artery disease   . Carotid artery stenosis s/p CEA 2013 10/16/2011  . Carotid artery occlusion without infarction, right 09/17/2011  . Carotid bruit 08/28/2011  . Gout 04/13/2011  . DM type 2 with diabetic peripheral neuropathy ([place]) 04/02/2008  . Hyperlipidemia associated with  type 2 diabetes mellitus ([place]) 04/02/2008  . Essential hypertension 04/02/2008  . [place] DISEASE, LUMBOSACRAL SPINE 04/02/2008    Past Surgical History:  Procedure Laterality Date  . CARDIAC CATHETERIZATION    . CAROTID ENDARTERECTOMY Right 09-27-11   cea  . ENDARTERECTOMY  09/27/2011   Procedure: RIGT ENDARTERECTOMY CAROTID;  Surgeon: Mal Misty, MD;  Location: [facility];  Service: Vascular;  Laterality: Right;  . EPIDURAL BLOCK INJECTION  02/2008   Drs. Eulis Manly  . gyn surgery  2004   total hysterectomy for mennorhagia,,salpingoophorectomy  . TONSILLECTOMY    . TOTAL ABDOMINAL HYSTERECTOMY W/ BILATERAL SALPINGOOPHORECTOMY Bilateral 2000  . VARICOSE VEIN SURGERY  2008   stripping     OB History    Gravida  2   Para  2   Term  2   Preterm      AB      Living  2     SAB      TAB      Ectopic      Multiple      Live Births               Home Medications    Prior to Admission medications   Medication Sig Start Date End Date Taking? Authorizing Provider  allopurinol (ZYLOPRIM) 300 MG tablet Take 1 tablet (300 mg total) by mouth daily. 07/01/17  Yes Mack Hook, MD  amLODipine (NORVASC) 10 MG tablet Take 1 tablet (10 mg total) by mouth daily. 07/01/17  Yes Mack Hook, MD  aspirin 81 MG tablet Take 1 tablet (81 mg total) by mouth daily. 01/04/12  Yes Wellington Hampshire, MD  atorvastatin (LIPITOR) 80 MG tablet 1 tab by mouth daily with evening meal Patient taking differently: Take 80 mg by mouth daily at 6 PM.  07/01/17  Yes Mack Hook, MD  carvedilol (COREG CR) 20 MG 24 hr capsule 1 cap by mouth daily Patient taking differently: Take 20 mg by mouth daily.  07/01/17  Yes Mack Hook, MD  clopidogrel (PLAVIX) 75 MG tablet TAKE ONE TABLET BY MOUTH ONCE DAILY WITH BREAKFAST Patient taking differently: Take 75 mg by mouth daily.  07/01/17  Yes Mack Hook, MD  colchicine 0.6 MG tablet 1 tab by mouth twice daily only  when needed for gouty attack Patient taking differently: Take 0.6 mg by mouth 2 (two) times daily as needed (for gout).  09/05/16  Yes Mack Hook, MD  fexofenadine (ALLEGRA) 180 MG tablet Take 1 tablet (180 mg total) by mouth daily. 06/17/17  Yes Mack Hook, MD  gabapentin (NEURONTIN) 300 MG capsule 1 cap by mouth three times daily Patient taking differently: Take 300 mg by mouth 3 (three) times daily.  07/01/17  Yes Mack Hook, MD  metaxalone (SKELAXIN) 400 MG tablet 1-2 tabs by mouth every 8 hours as needed for muscle spasm Patient taking differently: Take 400 mg by mouth 3 (three) times daily as needed (for muscle spasm).  06/17/17  Yes Mack Hook, MD  metFORMIN (GLUCOPHAGE-XR) 500 MG 24 hr tablet TAKE ONE TABLET BY MOUTH TWICE DAILY WITH MEALS Patient taking differently: Take 500 mg by mouth 2 (two) times daily.  07/01/17  Yes Mack Hook, MD  acetaminophen (TYLENOL) 500 MG tablet 2 tabs by mouth twice daily for back and leg pain Patient not taking: Reported on 11/18/2017 03/29/16   Mack Hook, MD  blood glucose meter kit and supplies KIT Check sugars twice daily 03/15/15   Mack Hook, MD  cyclobenzaprine (FLEXERIL) 5 MG tablet Take 1 tablet (5 mg total) by mouth 3 (three) times daily as needed for muscle spasms. Patient not taking: Reported on 11/18/2017 06/11/17   Mack Hook, MD  desloratadine (CLARINEX) 5 MG tablet Take 1 tablet (5 mg total) by mouth daily. Patient not taking: Reported on 11/18/2017 01/31/16   Mack Hook, MD  glucose blood test strip Check sugars twice daily 01/31/16   Mack Hook, MD  HYDROcodone-acetaminophen (NORCO/VICODIN) 5-325 MG tablet Take 1 tablet by mouth every 6 (six) hours as needed for moderate pain. Patient not taking: Reported on 11/18/2017 06/11/17   Mack Hook, MD  Menthol-Camphor [facility] THERAPY) 3-3 % GEL Apply to painful joints and muscles as needed Patient not taking:  Reported on 06/11/2017 12/12/16   Mack Hook, MD    Family History Family History  Problem Relation Age of Onset  . Hypertension Sister   . Hypothyroidism Sister   . Cancer Maternal Grandmother        lung  . Hypertension Son   . Hypertension Sister   . Diabetes Brother        lost toe in 2018 with new diagnosis of DM  . Hypertension Son     Social History Social History   Tobacco Use  . Smoking status: Light Tobacco Smoker    Packs/day: 0.10    Years: 25.00    Pack years: 2.50    Types: Cigarettes    Start date: 05/31/1968  . Smokeless tobacco: Never Used  . Tobacco comment: pt states she  smokes about 3-4 cigs per --states she contiues to work on it.  No interest in other help now.  Substance Use Topics  . Alcohol use: Yes    Alcohol/week: 2.0 standard drinks    Types: 2 Shots of liquor per week    Comment: 2 drinks a day  . Drug use: No     Allergies   Lisinopril-hydrochlorothiazide; Codeine; Oxycodone; Simvastatin; and Tramadol   Review of Systems Review of Systems  Constitutional:       Per HPI, otherwise negative  HENT:       Per HPI, otherwise negative  Respiratory:       Per HPI, otherwise negative  Cardiovascular:       Per HPI, otherwise negative  Gastrointestinal: Negative for vomiting.  Endocrine:       Negative aside from HPI  Genitourinary:       Neg aside from HPI   Musculoskeletal:       Per HPI, otherwise negative  Skin: Positive for color change.  Neurological: Negative for syncope.     Physical Exam Updated Vital Signs BP (!) 159/80   Pulse 78   Temp 98.6 F (37 C) (Oral)   Resp 16   Ht 5' 3.5" (1.613 m)   Wt 72.6 kg   SpO2 100%   BMI 27.90 kg/m   Physical Exam  Constitutional: She is oriented to person, place, and time. She appears well-developed and well-nourished. No distress.  HENT:  Head: Normocephalic.    Eyes: Conjunctivae and EOM are normal.  Neck: Normal range of motion and full passive range of  motion without pain. Neck supple. No spinous process tenderness and no muscular tenderness present. No neck rigidity. No edema, no erythema and normal range of motion present.  Cardiovascular: Normal rate and regular rhythm.  Pulmonary/Chest: Effort normal and breath sounds normal. No stridor. No respiratory distress.  Abdominal: She exhibits no distension.  Musculoskeletal: She exhibits no edema.       Arms: Neurological: She is alert and oriented to person, place, and time. She displays no atrophy and no tremor. No cranial nerve deficit. She exhibits normal muscle tone. She displays no seizure activity. Coordination normal.  Skin: Skin is warm and dry.  Psychiatric: She has a normal mood and affect.  Nursing note and vitals reviewed.    ED Treatments / Results   Radiology Dg Thoracic Spine 2 View  Result Date: 11/18/2017 CLINICAL DATA:  Back pain for 1 day, history of recent fall, initial encounter EXAM: THORACIC SPINE 2 VIEWS COMPARISON:  None. FINDINGS: Mild degenerative changes are noted. Vertebral body height is well maintained. No paraspinal mass lesion is seen. No soft tissue changes are noted. IMPRESSION: Mild degenerative change without acute abnormality. Electronically Signed   By: Inez Catalina M.D.   On: 11/18/2017 08:02    Procedures Procedures (including critical care time)  Medications Ordered in ED Medications  acetaminophen (TYLENOL) tablet 500 mg (500 mg Oral Given 11/18/17 0751)     Initial Impression / Assessment and Plan / ED Course  I have reviewed the triage vital signs and the nursing notes.  Pertinent labs & imaging results that were available during my care of the patient were reviewed by me and considered in my medical decision making (see chart for details).  8:25 AM On repeat exam the patient is in no distress per X-rays reviewed with her and her husband.  This elderly female presents after fall that occurred overnight. Patient has pain in  the  midthoracic area, but no evidence for pneumothorax, no increased work of breathing, only point tenderness on exam. Occult fracture considered and discussed. With no evidence for acute respiratory compromise, no evidence for deformity on x-ray, patient started on a medication regimen with topical therapy, discharged without close outpatient follow-up.    Final Clinical Impressions(s) / ED Diagnoses   Final diagnoses:  Fall, initial encounter  Contusion of face, initial encounter    ED Discharge Orders    None       Carmin Muskrat, MD 11/18/17 [phone]

## 2017-11-22 ENCOUNTER — Encounter: Payer: Self-pay | Admitting: Internal Medicine

## 2017-11-22 ENCOUNTER — Ambulatory Visit (INDEPENDENT_AMBULATORY_CARE_PROVIDER_SITE_OTHER): Payer: Medicare HMO | Admitting: Internal Medicine

## 2017-11-22 VITALS — BP 138/88 | HR 70 | Resp 12 | Ht 63.0 in | Wt 161.0 lb

## 2017-11-22 DIAGNOSIS — M546 Pain in thoracic spine: Secondary | ICD-10-CM | POA: Diagnosis not present

## 2017-11-22 DIAGNOSIS — Z716 Tobacco abuse counseling: Secondary | ICD-10-CM | POA: Diagnosis not present

## 2017-11-22 DIAGNOSIS — M25551 Pain in right hip: Secondary | ICD-10-CM | POA: Diagnosis not present

## 2017-11-22 MED ORDER — METAXALONE 400 MG PO TABS: ORAL_TABLET | ORAL | 0 refills | Status: DC

## 2017-11-22 NOTE — Progress Notes (Signed)
Subjective:    Patient ID: April Shaffer, female    DOB: 1950/07/09, 67 y.o.   MRN: [id number]  HPI   Five days ago fell when taking out trash--caught slipper on rug and fell to left flank against stacked pallet of water.   Was seen at [facility] ED that night. Thoracic vertebral xray shows DJD, but no acute injury.  Having pain all along left thoracic back and left side. Also struck her right zygomatic arch area against washer she thinks. No numbness or tingling Using Salonpas patches on her back, but has not found this helpful.  Has been applying heating pad to back with some improvement She did have some old cyclobenzaprine at home which she states helped a bit.  She only took once. She also has some oxycodone from previous injuries as well  2.  Right hip pain:  Hurts in morning especially.  Pain radiates into inguinal area.   Tobacco:  Has not smoked in past 5 days.  Encouraged to continue nonsmoking status.  Current Meds  Medication Sig  . acetaminophen (TYLENOL) 500 MG tablet 2 tabs by mouth twice daily for back and leg pain  . allopurinol (ZYLOPRIM) 300 MG tablet Take 1 tablet (300 mg total) by mouth daily.  Marland Kitchen amLODipine (NORVASC) 10 MG tablet Take 1 tablet (10 mg total) by mouth daily.  Marland Kitchen aspirin 81 MG tablet Take 1 tablet (81 mg total) by mouth daily.  Marland Kitchen atorvastatin (LIPITOR) 80 MG tablet 1 tab by mouth daily with evening meal  . blood glucose meter kit and supplies KIT Check sugars twice daily  . carvedilol (COREG CR) 20 MG 24 hr capsule 1 cap by mouth daily  . clopidogrel (PLAVIX) 75 MG tablet TAKE ONE TABLET BY MOUTH ONCE DAILY WITH BREAKFAST  . colchicine 0.6 MG tablet 1 tab by mouth twice daily only when needed for gouty attack  . cyclobenzaprine (FLEXERIL) 5 MG tablet Take 1 tablet (5 mg total) by mouth 3 (three) times daily as needed for muscle spasms.  . fexofenadine (ALLEGRA) 180 MG tablet Take 1 tablet (180 mg total) by mouth daily.  Marland Kitchen gabapentin (NEURONTIN) 300 MG  capsule 1 cap by mouth three times daily  . glucose blood test strip Check sugars twice daily  . HYDROcodone-acetaminophen (NORCO/VICODIN) 5-325 MG tablet Take 1 tablet by mouth every 6 (six) hours as needed for moderate pain.  . Liniments (SALONPAS PAIN RELIEF PATCH EX) Apply topically as needed.  . metFORMIN (GLUCOPHAGE-XR) 500 MG 24 hr tablet TAKE ONE TABLET BY MOUTH TWICE DAILY WITH MEALS    Allergies  Allergen Reactions  . Lisinopril-Hydrochlorothiazide Other (See Comments)    Angioedema  - Tongue swelling   . Codeine Itching  . Oxycodone Itching  . Simvastatin Other (See Comments)    Muscle pain  . Tramadol Itching    Review of Systems     Objective:   Physical Exam Moves a bit stiffly Limps also, favoring right hip  HEENT:  2 cm bruise/slight hematoma overlying right zygomatic arch Lungs:  CTA CV: RRR without murmur or rub, radial pulses normal and equal MS:  NT over all spinous processes.   Tender over both left and right para spinous musculature just below inferior scapular line. Right thigh and hip:  Tender over greater trochanter.  Increased pain with internal rotation more so than external rotation.  Full ROM. Neuro/LE:  DTRs 2+/4, motor 5/5, sensory normal to light touch.        Assessment &  Plan:  1.  Muscular back pain following fall.  Tylenol 1000 mg twice daily.  If uses one of her Norco, to not take the Tylenol as well. Metaxolone 400 mg 1-2 tabs every 8 hours as needed. Heating pad If no better by midweek next week, will send to PT. Xray earlier in week without acute injury.  2. Right hip/groin pain:  Right hip and pelvis films.  Pain control as above.  3.  Tobacco counseling:  Info on quit line for support to maintain no smoking status.  Do not want to consider oral meds currently with all the additional medication taking for musculoskeletal issues.  4.  HM:  Flu shot at health fair tomorrow.

## 2017-11-22 NOTE — Patient Instructions (Signed)
If you take one of your oxycodone pills, do not take the Tylenol for another 4-6 hours  Tobacco Cessation:   1800QUITNOW or [phone], the former for support and possibly free nicotine patches/gum and support; the latter for [facility] Smoking cessation class. Get rid of all smoking supplies:  Cigarettes, lighters, ashtrays--no stashes just in case at home if you are serious.

## 2017-11-25 ENCOUNTER — Ambulatory Visit
Admission: RE | Admit: 2017-11-25 | Discharge: 2017-11-25 | Disposition: A | Discharge status: Home / Self Care | Payer: Medicare HMO | Source: Ambulatory Visit | Attending: Internal Medicine | Admitting: Internal Medicine

## 2017-11-25 DIAGNOSIS — M25551 Pain in right hip: Secondary | ICD-10-CM

## 2017-11-26 ENCOUNTER — Other Ambulatory Visit: Payer: Self-pay | Admitting: Internal Medicine

## 2017-12-17 ENCOUNTER — Encounter: Payer: Self-pay | Admitting: Internal Medicine

## 2017-12-17 ENCOUNTER — Ambulatory Visit: Payer: Medicare HMO | Admitting: Internal Medicine

## 2017-12-17 VITALS — BP 140/80 | HR 78 | Resp 12 | Ht 63.0 in | Wt 161.0 lb

## 2017-12-17 DIAGNOSIS — I10 Essential (primary) hypertension: Secondary | ICD-10-CM

## 2017-12-17 DIAGNOSIS — M549 Dorsalgia, unspecified: Secondary | ICD-10-CM | POA: Diagnosis not present

## 2017-12-17 DIAGNOSIS — E785 Hyperlipidemia, unspecified: Secondary | ICD-10-CM

## 2017-12-17 DIAGNOSIS — M7061 Trochanteric bursitis, right hip: Secondary | ICD-10-CM

## 2017-12-17 DIAGNOSIS — E1169 Type 2 diabetes mellitus with other specified complication: Secondary | ICD-10-CM | POA: Diagnosis not present

## 2017-12-17 DIAGNOSIS — E1142 Type 2 diabetes mellitus with diabetic polyneuropathy: Secondary | ICD-10-CM

## 2017-12-17 DIAGNOSIS — M1611 Unilateral primary osteoarthritis, right hip: Secondary | ICD-10-CM

## 2017-12-17 LAB — GLUCOSE, POCT (MANUAL RESULT ENTRY): POC GLUCOSE: 126 mg/dL — AB (ref 70–99)

## 2017-12-17 MED ORDER — OMEGA-3-ACID ETHYL ESTERS 1 G PO CAPS: 1.0000 g | ORAL_CAPSULE | Freq: Two times a day (BID) | ORAL | 11 refills | Status: DC

## 2017-12-17 NOTE — Progress Notes (Signed)
Subjective:    Patient ID: April Shaffer, female    DOB: 10/05/1950, 67 y.o.   MRN: [id number]  HPI  1.  Thoracic back pain:  Much improved.  2.  Right Hip pain:  Had corticosteroid injection at Dr. Ruel Favors office last week.  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  The latter area was injected and if no improvement, to plan for intra articular injection in 2-3 weeks. She states her pain was relieved for about 24 hours and then recurred.  3.  DM:  Has not been checking sugars as she needs batteries for glucometer.  Sugar ok today. She is planning to order her free diabetic supplies soon.   4.  Hyperlipidemia:  Does eat fish fish once to twice weekly.  Mainly fried.  5.  HM:  Had flu vaccine last orange card sign up.  Had colonoscopy with Dr. Collene Mares with one polyp found and repeat in 5 years. [facility] check during summer:  No diabetic changes.     Current Meds  Medication Sig  . acetaminophen (TYLENOL) 500 MG tablet 2 tabs by mouth twice daily for back and leg pain  . allopurinol (ZYLOPRIM) 300 MG tablet Take 1 tablet (300 mg total) by mouth daily.  Marland Kitchen amLODipine (NORVASC) 10 MG tablet Take 1 tablet (10 mg total) by mouth daily.  Marland Kitchen aspirin 81 MG tablet Take 1 tablet (81 mg total) by mouth daily.  Marland Kitchen atorvastatin (LIPITOR) 80 MG tablet 1 tab by mouth daily with evening meal  . blood glucose meter kit and supplies KIT Check sugars twice daily  . carvedilol (COREG CR) 20 MG 24 hr capsule 1 cap by mouth daily  . clopidogrel (PLAVIX) 75 MG tablet TAKE ONE TABLET BY MOUTH ONCE DAILY WITH BREAKFAST  . colchicine 0.6 MG tablet 1 tab by mouth twice daily only when needed for gouty attack  . cyclobenzaprine (FLEXERIL) 5 MG tablet Take 1 tablet (5 mg total) by mouth 3 (three) times daily as needed for muscle spasms.  Marland Kitchen gabapentin (NEURONTIN) 300 MG capsule 1 cap by mouth three times daily  . glucose blood test strip Check sugars twice daily  . metFORMIN (GLUCOPHAGE-XR) 500 MG  24 hr tablet TAKE ONE TABLET BY MOUTH TWICE DAILY WITH MEALS  . [DISCONTINUED] metaxalone (SKELAXIN) 400 MG tablet TAKE 1 TO 2 TABLETS BY MOUTH EVERY 8 HOURS AS NEEDED FOR BACK PAIN    Allergies  Allergen Reactions  . Lisinopril-Hydrochlorothiazide Other (See Comments)    Angioedema  - Tongue swelling   . Codeine Itching  . Oxycodone Itching  . Simvastatin Other (See Comments)    Muscle pain  . Tramadol Itching       Objective:   Physical Exam NAD Lungs:  CTA CV:  RRR with normal S1 and S2, No S3, S4 or murmur.  Radial and DP pulses normal and equal LE:  No edema  Diabetic Foot Exam - Simple   Simple Foot Form Visual Inspection No deformities, no ulcerations, no other skin breakdown bilaterally:  Yes Sensation Testing Intact to touch and monofilament testing bilaterally:  Yes Pulse Check Posterior Tibialis and Dorsalis pulse intact bilaterally:  Yes Comments Varicosities of LE/and feet.          Assessment & Plan:  1.  DM:  A1C today.  Patient to send for glucometer and monitoring supplies through Medicare. Foot exam normal Eye exam reportedly normal this year.  2.  Hypertension:  Borderline today.  To  continue to try to get into pool for physical activity  3.  Hyperlipidemia:  On high dose Atorvastatin.  Has not tolerated Rosuvastatin in past.  Unlikely she will be able to get more physically active in next several months with her ortho issues, though encouraged pool work. Will add Lovaza 1000 mg twice daily to see if has any effect with LDL. Encouraged wild caught fish intake and not to fry.  4.  Ortho Issues:  As per Dr Ronnie Derby. As she had significant initial improvement in pain with local anesthetic, seems that the trochanteric bursitis may be the culprit, but will leave this up to ortho.  5.  Thoracic back pain:  Essentially resolved.    6.  HM up to date.  Encouraged shingles vaccine.

## 2017-12-17 NOTE — Patient Instructions (Addendum)
[facility] [address]  [place], [place] [address] [phone]  40 % discount:  Fish oil 1000 mg twice daily if Lovaza not covered adequately by Medicare  Call if Lovaza or prescription fish oil is too expensive  Check with [facility] on [facility] for shingles or herpes zoster vaccine

## 2017-12-18 LAB — HGB A1C W/O EAG: Hgb A1c MFr Bld: 6.8 % — ABNORMAL HIGH (ref 4.8–5.6)

## 2018-03-05 ENCOUNTER — Encounter: Payer: Self-pay | Admitting: Internal Medicine

## 2018-03-05 ENCOUNTER — Ambulatory Visit (INDEPENDENT_AMBULATORY_CARE_PROVIDER_SITE_OTHER): Payer: Medicare HMO | Admitting: Internal Medicine

## 2018-03-05 VITALS — BP 150/90 | HR 88 | Resp 12 | Ht 63.0 in | Wt 159.0 lb

## 2018-03-05 DIAGNOSIS — Z79899 Other long term (current) drug therapy: Secondary | ICD-10-CM

## 2018-03-05 DIAGNOSIS — E1169 Type 2 diabetes mellitus with other specified complication: Secondary | ICD-10-CM | POA: Diagnosis not present

## 2018-03-05 DIAGNOSIS — I2510 Atherosclerotic heart disease of native coronary artery without angina pectoris: Secondary | ICD-10-CM

## 2018-03-05 DIAGNOSIS — E010 Iodine-deficiency related diffuse (endemic) goiter: Secondary | ICD-10-CM

## 2018-03-05 DIAGNOSIS — I739 Peripheral vascular disease, unspecified: Secondary | ICD-10-CM

## 2018-03-05 DIAGNOSIS — E785 Hyperlipidemia, unspecified: Secondary | ICD-10-CM

## 2018-03-05 DIAGNOSIS — M1611 Unilateral primary osteoarthritis, right hip: Secondary | ICD-10-CM | POA: Diagnosis not present

## 2018-03-05 DIAGNOSIS — I779 Disorder of arteries and arterioles, unspecified: Secondary | ICD-10-CM

## 2018-03-05 DIAGNOSIS — E1142 Type 2 diabetes mellitus with diabetic polyneuropathy: Secondary | ICD-10-CM | POA: Diagnosis not present

## 2018-03-05 DIAGNOSIS — I10 Essential (primary) hypertension: Secondary | ICD-10-CM

## 2018-03-05 LAB — GLUCOSE, POCT (MANUAL RESULT ENTRY): POC GLUCOSE: 112 mg/dL — AB (ref 70–99)

## 2018-03-05 NOTE — Progress Notes (Signed)
Subjective:    Patient ID: April Shaffer, female    DOB: 1950/12/23, 68 y.o.   MRN: [id number]  HPI   Here for medical clearance for right total hip replacement for OA of hip  For hip, Ortho attempted different areas of corticosteroid injections x 3 with very short lived response. Patient shares Dr. French Ana is to perform the surgery.  She is to see Dr. Donzetta Matters with [facility] next week for his clearance and recommendation for hold on Plavix as well.    Medical Diagnoses:    1.  CAD:  No chest pain or dyspnea.  She has been limited with physical activity since her fall the end of September 2019.  She has not noted pain walking around house or coming up 5-6 steps in front of her home.  Prior to her fall, could walk down and up her steep driveway without chest pain.  She was limited by milder hip pain at the time--would have to pause once on way back due to hip pain only. Had cardiac catheterization November 2013 following abnormal stress treadmill testing. 50% ostial LAD stenosis 50% mid stenosis. 30% disease in the left circumflex.  Medical management recommended.  2. Bilateral carotid artery disease:  Previously underwent right carotid endarterectomy in 2013 after finding of right carotid bruit by her primary. Followed by Dr. Donzetta Matters, who she generally sees 1-2 times yearly for carotid doppler  2017 CT angio of neck.  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 Intracranial reconstitution via the posterior communicating artery. 2. 75% distal left common carotid artery stenosis. 3. Severe bilateral vertebral artery origin stenoses. 4. 50% proximal left subclavian artery stenosis. 5. Mild intracranial left ICA stenosis. 6. Left thyroid goiter. 7. Aortic atherosclerosis.  Again, she has an appt next week with Dr. Donzetta Matters to assess her carotid vascular condition for this surgery next week.    3.  DM:  Has been well controlled with A1Cs in low 6 percentage  range.  Due for A1C today  4.  Hyperlipidemia:  LDL not quite at goal 10/01/17 at 93, though rest of panel fine.  Current Meds  Medication Sig  . acetaminophen (TYLENOL) 500 MG tablet 2 tabs by mouth twice daily for back and leg pain  . allopurinol (ZYLOPRIM) 300 MG tablet Take 1 tablet (300 mg total) by mouth daily.  Marland Kitchen amLODipine (NORVASC) 10 MG tablet Take 1 tablet (10 mg total) by mouth daily.  Marland Kitchen aspirin 81 MG tablet Take 1 tablet (81 mg total) by mouth daily.  Marland Kitchen atorvastatin (LIPITOR) 80 MG tablet 1 tab by mouth daily with evening meal  . blood glucose meter kit and supplies KIT Check sugars twice daily  . carvedilol (COREG CR) 20 MG 24 hr capsule 1 cap by mouth daily  . clopidogrel (PLAVIX) 75 MG tablet TAKE ONE TABLET BY MOUTH ONCE DAILY WITH BREAKFAST  . colchicine 0.6 MG tablet 1 tab by mouth twice daily only when needed for gouty attack  . cyclobenzaprine (FLEXERIL) 5 MG tablet Take 1 tablet (5 mg total) by mouth 3 (three) times daily as needed for muscle spasms.  Marland Kitchen gabapentin (NEURONTIN) 300 MG capsule 1 cap by mouth three times daily  . glucose blood test strip Check sugars twice daily  . Liniments (SALONPAS PAIN RELIEF PATCH EX) Apply topically as needed.  . metFORMIN (GLUCOPHAGE-XR) 500 MG 24 hr tablet TAKE ONE TABLET BY MOUTH TWICE DAILY WITH MEALS   Allergies  Allergen Reactions  . Lisinopril-Hydrochlorothiazide Other (See Comments)    Angioedema  - Tongue swelling   . Codeine Itching  . Oxycodone Itching  . Simvastatin Other (See Comments)    Muscle pain  . Tramadol Itching         Review of Systems     Objective:   Physical Exam  NAD HEENT:  PERRL, EOMI TMs pearly gray, throat without injection Neck:  Supple, No adenopathy, left thyroid enlargement vs nodule. Chest:  CTA CV:  RRR with normal S1 and S2, No S3, S4 or murmur.  Left carotid bruit.  Radial and DP pulses normal and equal. Abd:  S, NT, No HSM or mass, + BS LE:  No edema.  Varicose veins of  legs.  Brisk distal cap refill.      Assessment & Plan:  1.  Medical clearance for Total Hip Replacement:  With her history of CAD on cath in 2013 and her inability to be really physically active for some time,  would like for her to see cardiology and considered for stress testing.   No other medical concern for clearance:  Diabetes has been well controlled. She will be seen shortly by Dr. Donzetta Matters regarding her Plavix and carotid artery disease. CMP, CBC  2.  DM:  Has been controlled:  A1C, urine microalbumin/crea.  3.  Hypertension:  BP up a bit today.  Recheck end of week with nurse visit.  Did not take meds this morning.  4.  Left thyromegaly vs nodule:  TSH and ultrasound.  5.  Hyperlipidemia:  FLP

## 2018-03-06 LAB — LIPID PANEL W/O CHOL/HDL RATIO
Cholesterol, Total: 188 mg/dL (ref 100–199)
HDL: 67 mg/dL (ref >39)
LDL Calculated: 103 mg/dL — ABNORMAL HIGH (ref 0–99)
Triglycerides: 91 mg/dL (ref 0–149)
VLDL CHOLESTEROL CAL: 18 mg/dL (ref 5–40)

## 2018-03-06 LAB — COMPREHENSIVE METABOLIC PANEL
ALBUMIN: 4.2 g/dL (ref 3.6–4.8)
ALT: 19 IU/L (ref 0–32)
AST: 25 IU/L (ref 0–40)
Albumin/Globulin Ratio: 1.2 (ref 1.2–2.2)
Alkaline Phosphatase: 106 IU/L (ref 39–117)
BUN/Creatinine Ratio: 13 (ref 12–28)
BUN: 10 mg/dL (ref 8–27)
Bilirubin Total: 0.5 mg/dL (ref 0.0–1.2)
CO2: 24 mmol/L (ref 20–29)
CREATININE: 0.75 mg/dL (ref 0.57–1.00)
Calcium: 9.2 mg/dL (ref 8.7–10.3)
Chloride: 102 mmol/L (ref 96–106)
GFR calc Af Amer: 97 mL/min/{1.73_m2} (ref >59)
GFR calc non Af Amer: 84 mL/min/{1.73_m2} (ref >59)
GLOBULIN, TOTAL: 3.4 g/dL (ref 1.5–4.5)
Glucose: 105 mg/dL — ABNORMAL HIGH (ref 65–99)
Potassium: 3.8 mmol/L (ref 3.5–5.2)
SODIUM: 142 mmol/L (ref 134–144)
Total Protein: 7.6 g/dL (ref 6.0–8.5)

## 2018-03-06 LAB — CBC WITH DIFFERENTIAL/PLATELET
BASOS: 1 %
Basophils Absolute: 0.1 10*3/uL (ref 0.0–0.2)
EOS (ABSOLUTE): 0.1 10*3/uL (ref 0.0–0.4)
EOS: 2 %
HEMOGLOBIN: 13.6 g/dL (ref 11.1–15.9)
Hematocrit: 38.5 % (ref 34.0–46.6)
IMMATURE GRANS (ABS): 0 10*3/uL (ref 0.0–0.1)
IMMATURE GRANULOCYTES: 0 %
Lymphocytes Absolute: 2.2 10*3/uL (ref 0.7–3.1)
Lymphs: 27 %
MCH: 31.9 pg (ref 26.6–33.0)
MCHC: 35.3 g/dL (ref 31.5–35.7)
MCV: 90 fL (ref 79–97)
MONOS ABS: 0.6 10*3/uL (ref 0.1–0.9)
Monocytes: 7 %
NEUTROS PCT: 63 %
Neutrophils Absolute: 5.3 10*3/uL (ref 1.4–7.0)
Platelets: 288 10*3/uL (ref 150–450)
RBC: 4.27 x10E6/uL (ref 3.77–5.28)
RDW: 14.2 % (ref 11.7–15.4)
WBC: 8.3 10*3/uL (ref 3.4–10.8)

## 2018-03-06 LAB — HGB A1C W/O EAG: Hgb A1c MFr Bld: 6.2 % — ABNORMAL HIGH (ref 4.8–5.6)

## 2018-03-06 LAB — MICROALBUMIN / CREATININE URINE RATIO
Creatinine, Urine: 97.6 mg/dL
MICROALB/CREAT RATIO: 865.8 mg/g{creat} — AB (ref 0.0–30.0)
Microalbumin, Urine: 845 ug/mL

## 2018-03-06 LAB — TSH: TSH: 1.61 u[IU]/mL (ref 0.450–4.500)

## 2018-03-07 ENCOUNTER — Other Ambulatory Visit: Payer: Self-pay | Admitting: Internal Medicine

## 2018-03-07 ENCOUNTER — Ambulatory Visit (INDEPENDENT_AMBULATORY_CARE_PROVIDER_SITE_OTHER): Payer: Medicare HMO

## 2018-03-07 VITALS — BP 122/70 | HR 80

## 2018-03-07 DIAGNOSIS — I10 Essential (primary) hypertension: Secondary | ICD-10-CM | POA: Diagnosis not present

## 2018-03-07 DIAGNOSIS — Z1231 Encounter for screening mammogram for malignant neoplasm of breast: Secondary | ICD-10-CM

## 2018-03-07 NOTE — Progress Notes (Signed)
Patient BP in normal range. Patient states it was high due to her not taking her meds the morning of her appointment. Informed to continue current dose of medications. patient verbalized understanding.

## 2018-03-11 ENCOUNTER — Telehealth: Payer: Self-pay | Admitting: *Deleted

## 2018-03-11 NOTE — Telephone Encounter (Signed)
   [facility] Pre-operative Risk Assessment    Request for surgical clearance:  1. What type of surgery is being performed? RIGHT TOTAL HIP REPLACEMENT   2. When is this surgery scheduled? TBD   3. What type of clearance is required (medical clearance vs. Pharmacy clearance to hold med vs. Both)? BOTH  4. Are there any medications that need to be held prior to surgery and how long?PLAVIX, ASA   5. Practice name and name of physician performing surgery? [facility]; DR. Quillian Quince CAFFREY   6. What is your office phone number  [phone]   7.   What is your office fax number [phone]  8.   Anesthesia type (None, local, MAC, general) ? CHOICE   Julaine Hua 03/11/2018, 12:27 PM  _________________________________________________________________   (provider comments below)

## 2018-03-12 NOTE — Telephone Encounter (Signed)
   Primary Cardiologist: will be new Chart reviewed as part of pre-operative protocol coverage.  Will review pre-op clearance during new provider appointment with Dr. Katrinka Blazing 05/07/2018.  I will route this recommendation to the requesting party via Epic fax function and remove from pre-op pool.  Please call with questions.  Hoffman, [place] 03/12/2018, 4:20 PM

## 2018-03-13 ENCOUNTER — Ambulatory Visit (HOSPITAL_COMMUNITY)
Admission: RE | Admit: 2018-03-13 | Discharge: 2018-03-13 | Disposition: A | Discharge status: Home / Self Care | Payer: Medicare HMO | Source: Ambulatory Visit | Attending: Vascular Surgery | Admitting: Vascular Surgery

## 2018-03-13 DIAGNOSIS — I6521 Occlusion and stenosis of right carotid artery: Secondary | ICD-10-CM | POA: Insufficient documentation

## 2018-03-14 ENCOUNTER — Encounter (HOSPITAL_COMMUNITY): Payer: Medicare HMO

## 2018-03-14 ENCOUNTER — Other Ambulatory Visit: Payer: Self-pay

## 2018-03-14 ENCOUNTER — Ambulatory Visit (INDEPENDENT_AMBULATORY_CARE_PROVIDER_SITE_OTHER): Payer: Medicare HMO | Admitting: Vascular Surgery

## 2018-03-14 ENCOUNTER — Encounter: Payer: Self-pay | Admitting: Vascular Surgery

## 2018-03-14 ENCOUNTER — Ambulatory Visit: Payer: Medicare HMO | Admitting: Vascular Surgery

## 2018-03-14 VITALS — BP 146/75 | HR 74 | Temp 98.2°F | Resp 14 | Ht 63.0 in | Wt 159.0 lb

## 2018-03-14 DIAGNOSIS — I6521 Occlusion and stenosis of right carotid artery: Secondary | ICD-10-CM | POA: Diagnosis not present

## 2018-03-14 NOTE — Progress Notes (Signed)
Patient ID: April Shaffer, female   DOB: 1950-10-19, 68 y.o.   MRN: [id number]  Reason for Consult: Carotid (f/u)   Referred by Mack Hook, MD  Subjective:     HPI:  April Shaffer is a 68 y.o. female with history of right carotid endarterectomy that then occluded but she was asymptomatic from this.  Denies ever having stroke TIA or amaurosis.  She does take aspirin Plavix and statin she is maximally medically managed.  She walks without issues does not have distal tissue loss or ulceration.  No complaints related to today's visit.  Chief complaint this time is right hip pain she is being evaluated for hip replacement.  Past Medical History:  Diagnosis Date  . Arthritis   . Carotid artery narrowing   . Coronary artery disease    Cardiac catheterization November 2013: 50% ostial LAD stenosis 50% mid stenosis. 30% disease in the left circumflex.  . Diabetes mellitus, type 2 ([place])   . Hyperlipidemia   . Hypertension   . Onychomycosis of toenail 07/31/2016  . Sleep apnea    hx had tonsilectomy and no longer has    Family History  Problem Relation Age of Onset  . Hypertension Sister   . Hypothyroidism Sister   . Cancer Maternal Grandmother        lung  . Hypertension Son   . Hypertension Sister   . Diabetes Brother        lost toe in 2018 with new diagnosis of DM  . Hypertension Son    Past Surgical History:  Procedure Laterality Date  . CARDIAC CATHETERIZATION    . CAROTID ENDARTERECTOMY Right 09-27-11   cea  . ENDARTERECTOMY  09/27/2011   Procedure: RIGT ENDARTERECTOMY CAROTID;  Surgeon: Mal Misty, MD;  Location: [place];  Service: Vascular;  Laterality: Right;  . EPIDURAL BLOCK INJECTION  02/2008   Drs. Eulis Manly  . gyn surgery  2004   total hysterectomy for mennorhagia,,salpingoophorectomy  . TONSILLECTOMY    . TOTAL ABDOMINAL HYSTERECTOMY W/ BILATERAL SALPINGOOPHORECTOMY Bilateral 2000  . VARICOSE VEIN SURGERY  2008   stripping    Short  Social History:  Social History   Tobacco Use  . Smoking status: Light Tobacco Smoker    Packs/day: 0.10    Years: 25.00    Pack years: 2.50    Types: Cigarettes    Start date: 05/31/1968  . Smokeless tobacco: Never Used  . Tobacco comment: pt states she smokes about 3-4 cigs per --states she contiues to work on it.  No interest in other help now.  Substance Use Topics  . Alcohol use: Yes    Alcohol/week: 2.0 standard drinks    Types: 2 Shots of liquor per week    Comment: 2 drinks a day    Allergies  Allergen Reactions  . Lisinopril-Hydrochlorothiazide Other (See Comments)    Angioedema  - Tongue swelling   . Codeine Itching  . Oxycodone Itching  . Simvastatin Other (See Comments)    Muscle pain  . Tramadol Itching    Current Outpatient Medications  Medication Sig Dispense Refill  . acetaminophen (TYLENOL) 500 MG tablet 2 tabs by mouth twice daily for back and leg pain 30 tablet 0  . allopurinol (ZYLOPRIM) 300 MG tablet Take 1 tablet (300 mg total) by mouth daily. 30 tablet 11  . amLODipine (NORVASC) 10 MG tablet Take 1 tablet (10 mg total) by mouth daily. 30 tablet 11  . aspirin 81 MG tablet  Take 1 tablet (81 mg total) by mouth daily. 30 tablet   . atorvastatin (LIPITOR) 80 MG tablet 1 tab by mouth daily with evening meal 30 tablet 11  . blood glucose meter kit and supplies KIT Check sugars twice daily 1 each 0  . carvedilol (COREG CR) 20 MG 24 hr capsule 1 cap by mouth daily 30 capsule 11  . clopidogrel (PLAVIX) 75 MG tablet TAKE ONE TABLET BY MOUTH ONCE DAILY WITH BREAKFAST 30 tablet 11  . colchicine 0.6 MG tablet 1 tab by mouth twice daily only when needed for gouty attack 60 tablet 4  . cyclobenzaprine (FLEXERIL) 5 MG tablet Take 1 tablet (5 mg total) by mouth 3 (three) times daily as needed for muscle spasms. 20 tablet 1  . fexofenadine (ALLEGRA) 180 MG tablet Take 1 tablet (180 mg total) by mouth daily. (Patient not taking: Reported on 12/17/2017)    . gabapentin  (NEURONTIN) 300 MG capsule 1 cap by mouth three times daily 90 capsule 11  . glucose blood test strip Check sugars twice daily 100 each 11  . Liniments (SALONPAS PAIN RELIEF PATCH EX) Apply topically as needed.    . metFORMIN (GLUCOPHAGE-XR) 500 MG 24 hr tablet TAKE ONE TABLET BY MOUTH TWICE DAILY WITH MEALS 180 tablet 3  . omega-3 acid ethyl esters (LOVAZA) 1 g capsule Take 1 capsule (1 g total) by mouth 2 (two) times daily. (Patient not taking: Reported on 03/05/2018) 60 capsule 11   No current facility-administered medications for this visit.     Review of Systems  Constitutional:  Constitutional negative. HENT: HENT negative.  Eyes: Eyes negative.  Respiratory: Respiratory negative.  Cardiovascular: Cardiovascular negative.  GI: Gastrointestinal negative.  Musculoskeletal: Musculoskeletal negative.  Skin: Skin negative.  Neurological: Neurological negative. Hematologic: Hematologic/lymphatic negative.  Psychiatric: Psychiatric negative.        Objective:  Objective   Vitals:   03/14/18 0851 03/14/18 0853  BP: (!) 141/80 (!) 146/75  Pulse: 76 74  Resp: 14   Temp: 98.2 F (36.8 C)   SpO2: 99%     Physical Exam HENT:     Head: Normocephalic.  Eyes:     Pupils: Pupils are equal, round, and reactive to light.  Neck:     Musculoskeletal: Neck supple.     Vascular: Carotid bruit present.  Cardiovascular:     Rate and Rhythm: Regular rhythm.     Heart sounds: Normal heart sounds.  Pulmonary:     Effort: Pulmonary effort is normal.     Breath sounds: Normal breath sounds.  Abdominal:     General: Abdomen is flat.  Musculoskeletal: Normal range of motion.        General: No swelling.  Skin:    General: Skin is warm and dry.     Capillary Refill: Capillary refill takes less than 2 seconds.  Neurological:     General: No focal deficit present.     Mental Status: She is alert.  Psychiatric:        Mood and Affect: Mood normal.     Data: I reviewed her carotid  duplex performed yesterday which demonstrates known occlusion in the right ICA with left ICA consistent 60 to 79% stenosis and bilateral vertebral arteries demonstrate antegrade flow.  He stopped velocity on the left 368 cm/s and end-diastolic velocity 93 cm/s     Assessment/Plan:     68 year old female follows up for evaluation of carotid artery disease with previous carotid endarterectomy that subsequently occluded asymptomatically.  She remains asymptomatic from a high-grade left carotid stenosis although this may be falsely elevated given her contralateral occlusion.  There is a bruit on the left side.  She will remain on maximal medical management.  We again discussed the signs of stroke TIA and amaurosis.  I have reassured her that her velocities are essentially unchanged since this time last year.  We will set her up for 1 year follow-up with repeat duplex unless she has issues in the interim.  From a surgical standpoint I have discussed with her that she is clear from vascular to proceed with hip surgery if necessary and can hold Plavix for the necessary timeframe and restart when possible.     Brandon Christopher Cain MD [facility]  

## 2018-03-18 ENCOUNTER — Ambulatory Visit
Admission: RE | Admit: 2018-03-18 | Discharge: 2018-03-18 | Disposition: A | Discharge status: Home / Self Care | Payer: Medicare HMO | Source: Ambulatory Visit | Attending: Internal Medicine | Admitting: Internal Medicine

## 2018-03-18 DIAGNOSIS — E010 Iodine-deficiency related diffuse (endemic) goiter: Secondary | ICD-10-CM

## 2018-03-20 ENCOUNTER — Other Ambulatory Visit: Payer: Medicare HMO

## 2018-03-26 ENCOUNTER — Telehealth (HOSPITAL_COMMUNITY): Payer: Self-pay

## 2018-03-26 ENCOUNTER — Ambulatory Visit: Payer: Medicare HMO | Admitting: Internal Medicine

## 2018-03-26 ENCOUNTER — Encounter: Payer: Self-pay | Admitting: Cardiovascular Disease

## 2018-03-26 ENCOUNTER — Ambulatory Visit (INDEPENDENT_AMBULATORY_CARE_PROVIDER_SITE_OTHER): Payer: Medicare HMO | Admitting: Cardiovascular Disease

## 2018-03-26 VITALS — BP 153/86 | HR 76 | Ht 63.0 in | Wt 162.0 lb

## 2018-03-26 DIAGNOSIS — E785 Hyperlipidemia, unspecified: Secondary | ICD-10-CM | POA: Diagnosis not present

## 2018-03-26 DIAGNOSIS — I2510 Atherosclerotic heart disease of native coronary artery without angina pectoris: Secondary | ICD-10-CM | POA: Diagnosis not present

## 2018-03-26 DIAGNOSIS — E1169 Type 2 diabetes mellitus with other specified complication: Secondary | ICD-10-CM | POA: Diagnosis not present

## 2018-03-26 DIAGNOSIS — Z01818 Encounter for other preprocedural examination: Secondary | ICD-10-CM | POA: Insufficient documentation

## 2018-03-26 DIAGNOSIS — I10 Essential (primary) hypertension: Secondary | ICD-10-CM | POA: Diagnosis not present

## 2018-03-26 MED ORDER — EZETIMIBE 10 MG PO TABS: 10.0000 mg | ORAL_TABLET | Freq: Every day | ORAL | 3 refills | Status: DC

## 2018-03-26 NOTE — Telephone Encounter (Signed)
Encounter complete. 

## 2018-03-26 NOTE — Assessment & Plan Note (Signed)
History of CAD with cardiac catheterization performed by Dr. Kirke Corin 01/16/2012 revealing noncritical CAD.  Medical therapy was recommended.  This was done after an abnormal stress test.

## 2018-03-26 NOTE — Assessment & Plan Note (Signed)
History of carotid artery disease with recent Dopplers performed 03/03/2018 revealing an occluded right internal carotid artery with moderate left ICA stenosis followed by Dr. Randie Heinz

## 2018-03-26 NOTE — Assessment & Plan Note (Signed)
April Shaffer was referred by Dr. Delrae Alfred for preoperative clearance before elective total hip replacement.  She does have risk factors of tobacco abuse, treated hypertension, diabetes and hyperlipidemia.  She did have a positive Myoview 12/31/2011 which led to a cardiac catheterization by Dr. Kirke Corin 01/16/2012 that showed noncritical CAD.  Medical therapy was recommended.  Given her ongoing risk factors and the fact that is been 7 years since her last cath in light of her upcoming total hip replacement I am going to get a pharmacologic Myoview stress test to further evaluate.

## 2018-03-26 NOTE — Assessment & Plan Note (Signed)
History of hyperlipidemia on high-dose statin therapy with recent lipid profile performed 03/03/2018 revealed a total cholesterol 188, LDL of 103 and HDL 67.  She is not at goal for secondary prevention.  And will add Zetia 10 mg a day and we will recheck a lipid liver profile in 2 to 3 months.

## 2018-03-26 NOTE — Progress Notes (Signed)
03/26/2018 April Shaffer   08/02/1950  [id number]  Primary Physician April Hook, MD Primary Cardiologist: Lorretta Harp MD April Shaffer, [place]  HPI:  April Shaffer is a 68 y.o. moderately overweight divorced African-American female mother of 2, grandmother of 63 grandchildren who is retired from being a Secretary/administrator.  She was referred by Dr. Amil Amen for preoperative clearance before elective total hip replacement.  Risk factors include 20-pack-year tobacco abuse currently smoking one third of a pack a day.  She has treated hypertension, diabetes and hyperlipidemia.  She is never had a heart attack or stroke although she does have known carotid artery disease with Dopplers performed 03/03/2018 revealing an occluded right internal carotid artery and moderate left ICA stenosis.  She did have a positive Myoview 12/31/2011 which led to a diagnostic cath by Dr. Fletcher Anon in [facility] 01/16/2012 that showed noncritical CAD.  Medical therapy was recommended.   Current Meds  Medication Sig  . acetaminophen (TYLENOL) 500 MG tablet 2 tabs by mouth twice daily for back and leg pain  . allopurinol (ZYLOPRIM) 300 MG tablet Take 1 tablet (300 mg total) by mouth daily.  Marland Kitchen amLODipine (NORVASC) 10 MG tablet Take 1 tablet (10 mg total) by mouth daily.  Marland Kitchen aspirin 81 MG tablet Take 1 tablet (81 mg total) by mouth daily.  Marland Kitchen atorvastatin (LIPITOR) 80 MG tablet 1 tab by mouth daily with evening meal  . blood glucose meter kit and supplies KIT Check sugars twice daily  . carvedilol (COREG CR) 20 MG 24 hr capsule 1 cap by mouth daily  . clopidogrel (PLAVIX) 75 MG tablet TAKE ONE TABLET BY MOUTH ONCE DAILY WITH BREAKFAST  . colchicine 0.6 MG tablet 1 tab by mouth twice daily only when needed for gouty attack  . cyclobenzaprine (FLEXERIL) 5 MG tablet Take 1 tablet (5 mg total) by mouth 3 (three) times daily as needed for muscle spasms.  . fexofenadine (ALLEGRA) 180 MG tablet Take 1 tablet (180 mg  total) by mouth daily.  Marland Kitchen gabapentin (NEURONTIN) 300 MG capsule 1 cap by mouth three times daily  . glucose blood test strip Check sugars twice daily  . Liniments (SALONPAS PAIN RELIEF PATCH EX) Apply topically as needed.  . metFORMIN (GLUCOPHAGE-XR) 500 MG 24 hr tablet TAKE ONE TABLET BY MOUTH TWICE DAILY WITH MEALS  . omega-3 acid ethyl esters (LOVAZA) 1 g capsule Take 1 capsule (1 g total) by mouth 2 (two) times daily.     Allergies  Allergen Reactions  . Lisinopril-Hydrochlorothiazide Other (See Comments)    Angioedema  - Tongue swelling   . Codeine Itching  . Oxycodone Itching  . Simvastatin Other (See Comments)    Muscle pain  . Tramadol Itching    Social History   Socioeconomic History  . Marital status: Divorced    Spouse name: Leotis Pain  . Number of children: 2  . Years of education: 46  . Highest education level: Not on file  Occupational History  . Occupation: housekeeping-retired    Comment: [employer]-retired 07/23/2014  Social Needs  . Financial resource strain: Not on file  . Food insecurity:    Worry: Never true    Inability: Never true  . Transportation needs:    Medical: No    Non-medical: No  Tobacco Use  . Smoking status: Light Tobacco Smoker    Packs/day: 0.10    Years: 25.00    Pack years: 2.50    Types: Cigarettes  Start date: 05/31/1968  . Smokeless tobacco: Never Used  . Tobacco comment: pt states she smokes about 3-4 cigs per --states she contiues to work on it.  No interest in other help now.  Substance and Sexual Activity  . Alcohol use: Yes    Alcohol/week: 2.0 standard drinks    Types: 2 Shots of liquor per week    Comment: 2 drinks a day  . Drug use: No  . Sexual activity: Not on file  Lifestyle  . Physical activity:    Days per week: 0 days    Minutes per session: 0 min  . Stress: Not at all  Relationships  . Social connections:    Talks on phone: More than three times a week    Gets together: More than three  times a week    Attends religious service: Not on file    Active member of club or organization: No    Attends meetings of clubs or organizations: Never    Relationship status: Divorced  . Intimate partner violence:    Fear of current or ex partner: No    Emotionally abused: No    Physically abused: No    Forced sexual activity: No  Other Topics Concern  . Not on file  Social History Narrative   Regular exercise, walking   Diet-    Lives with long term boyfriend (20+ years together)   Both sons live in [place]: General: negative for chills, fever, night sweats or weight changes.  Cardiovascular: negative for chest pain, dyspnea on exertion, edema, orthopnea, palpitations, paroxysmal nocturnal dyspnea or shortness of breath Dermatological: negative for rash Respiratory: negative for cough or wheezing Urologic: negative for hematuria Abdominal: negative for nausea, vomiting, diarrhea, bright red blood per rectum, melena, or hematemesis Neurologic: negative for visual changes, syncope, or dizziness All other systems reviewed and are otherwise negative except as noted above.    Blood pressure (!) 153/86, pulse 76, height _0  (1.6 m), weight 162 lb (73.5 kg), SpO2 98 %.  General appearance: alert and no distress Neck: no adenopathy, no JVD, supple, symmetrical, trachea midline, thyroid not enlarged, symmetric, no tenderness/mass/nodules and Left carotid bruit Lungs: clear to auscultation bilaterally Heart: regular rate and rhythm, S1, S2 normal, no murmur, click, rub or gallop Extremities: extremities normal, atraumatic, no cyanosis or edema Pulses: 2+ and symmetric Skin: Skin color, texture, turgor normal. No rashes or lesions Neurologic: Alert and oriented X 3, normal strength and tone. Normal symmetric reflexes. Normal coordination and gait  EKG normal sinus rhythm at 76 with nonspecific ST and T wave changes.  I personally reviewed this  EKG  ASSESSMENT AND PLAN:   Hyperlipidemia associated with type 2 diabetes mellitus ([place]) History of hyperlipidemia on high-dose statin therapy with recent lipid profile performed 03/03/2018 revealed a total cholesterol 188, LDL of 103 and HDL 67.  She is not at goal for secondary prevention.  And will add Zetia 10 mg a day and we will recheck a lipid liver profile in 2 to 3 months.  Essential hypertension History of essential hypertension with blood pressure measured at 153/86.  She is on amlodipine and carvedilol.  Continue current meds at current dosing.  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 Donzetta Matters  Coronary artery disease History of CAD with cardiac catheterization performed by  Dr. Fletcher Anon 01/16/2012 revealing noncritical CAD.  Medical therapy was recommended.  This was done after an abnormal stress test.  Preoperative clearance Ms. Gunnarson was referred by Dr. Amil Amen for preoperative clearance before elective total hip replacement.  She does have risk factors of tobacco abuse, treated hypertension, diabetes and hyperlipidemia.  She did have a positive Myoview 12/31/2011 which led to a cardiac catheterization by Dr. Fletcher Anon 01/16/2012 that showed noncritical CAD.  Medical therapy was recommended.  Given her ongoing risk factors and the fact that is been 7 years since her last cath in light of her upcoming total hip replacement I am going to get a pharmacologic Myoview stress test to further evaluate.      Lorretta Harp MD FACP,FACC,FAHA, [facility] 03/26/2018 9:11 AM

## 2018-03-26 NOTE — Assessment & Plan Note (Signed)
History of essential hypertension with blood pressure measured at 153/86.  She is on amlodipine and carvedilol.  Continue current meds at current dosing.

## 2018-03-26 NOTE — Patient Instructions (Signed)
Medication Instructions:  START EZETIMIBE 10 MG, ONE TABLET BY MOUTH DAILY  If you need a refill on your cardiac medications before your next appointment, please call your pharmacy.   Lab work: Your physician recommends that you return for lab work in 2-3 MONTHS: LIPID/LIVER PANELS  If you have labs (blood work) drawn today and your tests are completely normal, you will receive your results only by: Marland Kitchen MyChart Message (if you have MyChart) OR . A paper copy in the mail If you have any lab test that is abnormal or we need to change your treatment, we will call you to review the results.  Testing/Procedures: Your physician has requested that you have a lexiscan myoview. For further information please visit [email or web address] Please follow instruction sheet, as given.  FOR PREOPERATIVE CLEARANCE  Follow-Up: At [facility], you and your health needs are our priority.  As part of our continuing mission to provide you with exceptional heart care, we have created designated Provider Care Teams.  These Care Teams include your primary Cardiologist (physician) and Advanced Practice Providers (APPs -  Physician Assistants and Nurse Practitioners) who all work together to provide you with the care you need, when you need it. . You MAY SCHEDULE a follow up appointment AS NEEDED.  Please call our office 2 months in advance to schedule this appointment.  You may see Dr. Allyson Sabal or one of the following Advanced Practice Providers on your designated Care Team:   . Corine Shelter, [place] . [name], PA-C . Micah Flesher, PA-C . Joni Reining, DNP . Theodore Demark, PA-C . Judy Pimple, PA-C . Marjie Skiff, PA-C

## 2018-03-27 ENCOUNTER — Telehealth (HOSPITAL_COMMUNITY): Payer: Self-pay

## 2018-03-27 NOTE — Telephone Encounter (Signed)
Encounter complete. 

## 2018-03-28 ENCOUNTER — Ambulatory Visit (HOSPITAL_COMMUNITY)
Admission: RE | Admit: 2018-03-28 | Discharge: 2018-03-28 | Disposition: A | Discharge status: Home / Self Care | Payer: Medicare HMO | Source: Ambulatory Visit | Attending: Cardiology | Admitting: Cardiology

## 2018-03-28 DIAGNOSIS — I2510 Atherosclerotic heart disease of native coronary artery without angina pectoris: Secondary | ICD-10-CM

## 2018-03-28 LAB — MYOCARDIAL PERFUSION IMAGING
CHL CUP NUCLEAR SRS: 5
LV dias vol: 72 mL (ref 46–106)
LV sys vol: 23 mL
Peak HR: 93 {beats}/min
Rest HR: 68 {beats}/min
SDS: 2
SSS: 7
TID: 1.58

## 2018-03-28 MED ORDER — REGADENOSON 0.4 MG/5ML IV SOLN
0.4000 mg | Freq: Once | INTRAVENOUS | Status: AC
  Administered 2018-03-28: 0.4 mg via INTRAVENOUS

## 2018-03-28 MED ORDER — TECHNETIUM TC 99M TETROFOSMIN IV KIT
9.2000 | PACK | Freq: Once | INTRAVENOUS | Status: AC | PRN
  Administered 2018-03-28: 9.2 via INTRAVENOUS
  Filled 2018-03-28: qty 10

## 2018-03-28 MED ORDER — TECHNETIUM TC 99M TETROFOSMIN IV KIT
31.1000 | PACK | Freq: Once | INTRAVENOUS | Status: AC | PRN
  Administered 2018-03-28: 31.1 via INTRAVENOUS
  Filled 2018-03-28: qty 32

## 2018-04-02 ENCOUNTER — Telehealth: Payer: Self-pay

## 2018-04-02 ENCOUNTER — Ambulatory Visit: Payer: Medicare HMO | Admitting: Internal Medicine

## 2018-04-02 NOTE — Telephone Encounter (Signed)
   [facility] Pre-operative Risk Assessment    Request for surgical clearance:  1. What type of surgery is being performed? Right total hip replacement   2. When is this surgery scheduled? TBD   3. What type of clearance is required (medical clearance vs. Pharmacy clearance to hold med vs. Both)? Both  4. Are there any medications that need to be held prior to surgery and how long? Aspirin, Plavix   5. Practice name and name of physician performing surgery? [facility] - Dr. Earlie Server   6. What is your office phone number [phone]    7.   What is your office fax number [phone]  8.   Anesthesia type (None, local, MAC, general) ? unknown   April Shaffer 04/02/2018, 11:16 AM  _________________________________________________________________   (provider comments below)

## 2018-04-07 NOTE — Telephone Encounter (Signed)
Low risk Myoview.  Cleared for her upcoming surgery at low risk.

## 2018-04-08 NOTE — Telephone Encounter (Signed)
   Primary Cardiologist: Nanetta Batty, MD  Chart reviewed as part of pre-operative protocol coverage. Given past medical history and time since last visit, based on ACC/AHA guidelines, April Shaffer would be at acceptable risk for the planned procedure without further cardiovascular testing.   I will route this recommendation to the requesting party via Epic fax function and remove from pre-op pool.  Please call with questions. I personally discussed with Dr. Allyson Sabal, ok to hold aspirin and Plavix for 5 days prior to procedure and restart as soon as possible after the procedure at the surgeon's discretion based on bleeding risk.  [name], [place] 04/08/2018, 8:36 AM

## 2018-04-15 ENCOUNTER — Ambulatory Visit
Admission: RE | Admit: 2018-04-15 | Discharge: 2018-04-15 | Disposition: A | Discharge status: Home / Self Care | Payer: Medicare HMO | Source: Ambulatory Visit | Attending: Internal Medicine | Admitting: Internal Medicine

## 2018-04-15 ENCOUNTER — Ambulatory Visit: Payer: Medicare HMO

## 2018-04-15 DIAGNOSIS — Z1231 Encounter for screening mammogram for malignant neoplasm of breast: Secondary | ICD-10-CM

## 2018-05-06 ENCOUNTER — Ambulatory Visit: Payer: Self-pay | Admitting: Physician Assistant

## 2018-05-06 NOTE — H&P (Signed)
TOTAL HIP ADMISSION H&P  Patient is admitted for right total hip arthroplasty.  Subjective:  Chief Complaint: right hip pain  HPI: April Shaffer, 68 y.o. female, has a history of pain and functional disability in the right hip(s) due to arthritis and patient has failed non-surgical conservative treatments for greater than 12 weeks to include NSAID's and/or analgesics and activity modification.  Onset of symptoms was gradual starting 4 years ago with gradually worsening course since that time.The patient noted no past surgery on the right hip(s).  Patient currently rates pain in the right hip at 9 out of 10 with activity. Patient has night pain, worsening of pain with activity and weight bearing, trendelenberg gait, pain that interfers with activities of daily living and pain with passive range of motion. Patient has evidence of periarticular osteophytes and joint space narrowing by imaging studies. This condition presents safety issues increasing the risk of falls.  There is no current active infection.  Patient Active Problem List   Diagnosis Date Noted  . Preoperative clearance 03/26/2018  . Upper back pain on left side 10/30/2016  . Onychomycosis of toenail 07/31/2016  . Encounter for postoperative carotid endarterectomy surveillance 12/07/2015  . Varicosities of leg 06/03/2015  . Acute left lumbar radiculopathy 04/15/2014  . Chronic sciatica of right side 03/17/2013  . Diabetic peripheral neuropathy ([place]) 03/17/2013  . Coronary artery disease   . Carotid artery stenosis s/p CEA 2013 10/16/2011  . Carotid artery occlusion without infarction, right 09/17/2011  . Carotid bruit 08/28/2011  . Gout 04/13/2011  . DM type 2 with diabetic peripheral neuropathy ([place]) 04/02/2008  . Hyperlipidemia associated with type 2 diabetes mellitus ([place]) 04/02/2008  . Essential hypertension 04/02/2008  . [place] DISEASE, LUMBOSACRAL SPINE 04/02/2008   Past Medical History:  Diagnosis Date   . Arthritis   . Carotid artery narrowing   . Coronary artery disease    Cardiac catheterization November 2013: 50% ostial LAD stenosis 50% mid stenosis. 30% disease in the left circumflex.  . Diabetes mellitus, type 2 ([place])   . Hyperlipidemia   . Hypertension   . Onychomycosis of toenail 07/31/2016  . Sleep apnea    hx had tonsilectomy and no longer has     Past Surgical History:  Procedure Laterality Date  . CARDIAC CATHETERIZATION    . CAROTID ENDARTERECTOMY Right 09-27-11   cea  . ENDARTERECTOMY  09/27/2011   Procedure: RIGT ENDARTERECTOMY CAROTID;  Surgeon: Mal Misty, MD;  Location: [place];  Service: Vascular;  Laterality: Right;  . EPIDURAL BLOCK INJECTION  02/2008   Drs. Eulis Manly  . gyn surgery  2004   total hysterectomy for mennorhagia,,salpingoophorectomy  . TONSILLECTOMY    . TOTAL ABDOMINAL HYSTERECTOMY W/ BILATERAL SALPINGOOPHORECTOMY Bilateral 2000  . VARICOSE VEIN SURGERY  2008   stripping    Current Outpatient Medications  Medication Sig Dispense Refill Last Dose  . acetaminophen (TYLENOL) 650 MG CR tablet Take 1,300 mg by mouth daily.     Marland Kitchen allopurinol (ZYLOPRIM) 300 MG tablet Take 1 tablet (300 mg total) by mouth daily. 30 tablet 11 Taking  . amLODipine (NORVASC) 10 MG tablet Take 1 tablet (10 mg total) by mouth daily. 30 tablet 11 Taking  . aspirin EC 81 MG tablet Take 81 mg by mouth daily.     Marland Kitchen atorvastatin (LIPITOR) 80 MG tablet 1 tab by mouth daily with evening meal (Patient taking differently: Take 80 mg by mouth daily. ) 30 tablet 11 Taking  . blood  glucose meter kit and supplies KIT Check sugars twice daily 1 each 0 Taking  . carvedilol (COREG CR) 20 MG 24 hr capsule 1 cap by mouth daily (Patient taking differently: Take 20 mg by mouth daily. ) 30 capsule 11 Taking  . clopidogrel (PLAVIX) 75 MG tablet TAKE ONE TABLET BY MOUTH ONCE DAILY WITH BREAKFAST (Patient taking differently: Take 75 mg by mouth daily with breakfast. ) 30 tablet 11 Taking  .  colchicine 0.6 MG tablet 1 tab by mouth twice daily only when needed for gouty attack (Patient taking differently: Take 0.6 mg by mouth 2 (two) times daily as needed (gout attacks). ) 60 tablet 4 Taking  . cyclobenzaprine (FLEXERIL) 5 MG tablet Take 1 tablet (5 mg total) by mouth 3 (three) times daily as needed for muscle spasms. 20 tablet 1 Taking  . ezetimibe (ZETIA) 10 MG tablet Take 1 tablet (10 mg total) by mouth daily. 90 tablet 3   . fexofenadine (ALLEGRA) 180 MG tablet Take 1 tablet (180 mg total) by mouth daily. (Patient taking differently: Take 180 mg by mouth daily as needed for allergies. )   Taking  . gabapentin (NEURONTIN) 300 MG capsule 1 cap by mouth three times daily (Patient taking differently: Take 300 mg by mouth 3 (three) times daily as needed (pain). ) 90 capsule 11 Taking  . glucose blood test strip Check sugars twice daily 100 each 11 Taking  . Liniments (SALONPAS PAIN RELIEF PATCH EX) Place 1 patch onto the skin daily as needed.    Taking  . metFORMIN (GLUCOPHAGE-XR) 500 MG 24 hr tablet TAKE ONE TABLET BY MOUTH TWICE DAILY WITH MEALS (Patient taking differently: Take 1,000 mg by mouth daily. ) 180 tablet 3 Taking  . omega-3 acid ethyl esters (LOVAZA) 1 g capsule Take 1 capsule (1 g total) by mouth 2 (two) times daily. (Patient not taking: Reported on 05/06/2018) 60 capsule 11 Not Taking at Unknown time   No current facility-administered medications for this visit.    Allergies  Allergen Reactions  . Lisinopril-Hydrochlorothiazide Other (See Comments)    Angioedema  - Tongue swelling   . Codeine Itching  . Oxycodone Itching  . Simvastatin Other (See Comments)    Muscle pain  . Tramadol Itching    Social History   Tobacco Use  . Smoking status: Light Tobacco Smoker    Packs/day: 0.10    Years: 25.00    Pack years: 2.50    Types: Cigarettes    Start date: 05/31/1968  . Smokeless tobacco: Never Used  . Tobacco comment: pt states she smokes about 3-4 cigs per --states  she contiues to work on it.  No interest in other help now.  Substance Use Topics  . Alcohol use: Yes    Alcohol/week: 2.0 standard drinks    Types: 2 Shots of liquor per week    Comment: 2 drinks a day    Family History  Problem Relation Age of Onset  . Hypertension Sister   . Hypothyroidism Sister   . Cancer Maternal Grandmother        lung  . Hypertension Son   . Hypertension Sister   . Diabetes Brother        lost toe in 2018 with new diagnosis of DM  . Hypertension Son      Review of Systems  Musculoskeletal: Positive for joint pain.  Neurological: Positive for headaches.  All other systems reviewed and are negative.   Objective:  Physical Exam  Constitutional:  She is oriented to person, place, and time. She appears well-developed and well-nourished. No distress.  HENT:  Head: Normocephalic.  Eyes: Pupils are equal, round, and reactive to light. Conjunctivae and EOM are normal.  Neck: Normal range of motion. Neck supple.  Cardiovascular: Normal rate, regular rhythm, normal heart sounds and intact distal pulses.  Respiratory: Effort normal and breath sounds normal. No respiratory distress. She has no wheezes.  GI: Soft. Bowel sounds are normal. She exhibits no distension. There is no abdominal tenderness.  Musculoskeletal:     Right hip: She exhibits decreased range of motion, decreased strength, tenderness and bony tenderness.  Lymphadenopathy:    She has no cervical adenopathy.  Neurological: She is alert and oriented to person, place, and time.  Skin: Skin is warm and dry. No rash noted. No erythema.  Psychiatric: She has a normal mood and affect. Her behavior is normal.    Vital signs in last 24 hours: _0 @  Labs:   Estimated body mass index is 28.7 kg/m as calculated from the following:   Height as of 03/28/18: _1  (1.6 m).   Weight as of 03/28/18: 73.5 kg.   Imaging Review Plain radiographs demonstrate severe degenerative joint disease of the  right hip(s). The bone quality appears to be good for age and reported activity level.      Assessment/Plan:  End stage arthritis, right hip(s)  The patient history, physical examination, clinical judgement of the provider and imaging studies are consistent with end stage degenerative joint disease of the right hip(s) and total hip arthroplasty is deemed medically necessary. The treatment options including medical management, injection therapy, arthroscopy and arthroplasty were discussed at length. The risks and benefits of total hip arthroplasty were presented and reviewed. The risks due to aseptic loosening, infection, stiffness, dislocation/subluxation,  thromboembolic complications and other imponderables were discussed.  The patient acknowledged the explanation, agreed to proceed with the plan and consent was signed. Patient is being admitted for inpatient treatment for surgery, pain control, PT, OT, prophylactic antibiotics, VTE prophylaxis, progressive ambulation and ADL's and discharge planning.The patient is planning to be discharged home with home health services

## 2018-05-07 ENCOUNTER — Ambulatory Visit: Payer: Medicare HMO | Admitting: Interventional Cardiology

## 2018-05-08 NOTE — Patient Instructions (Addendum)
April Shaffer  05/08/2018   Your procedure is scheduled on: 05-16-18    Report to [facility] Main  Entrance    Report to Admitting at 7:43 AM    Call this number if you have problems the morning of surgery [phone]    Remember: Do not eat food or drink liquids :After Midnight.    BRUSH YOUR TEETH MORNING OF SURGERY AND RINSE YOUR MOUTH OUT, NO CHEWING GUM CANDY OR MINTS.     Take these medicines the morning of surgery with A SIP OF WATER: Allopurinol (Zyloprim), Amlodipine (Norvasc), Carvedilol (Coreg), Ezetimibe (Zetia), Atorvastatin (Lipitor), Colchicine, prn, and Gabapentin, prn   DO NOT TAKE ANY DIABETIC MEDICATIONS DAY OF YOUR SURGERY                               You may not have any metal on your body including hair pins and              piercings  Do not wear jewelry, make-up, lotions, powders or perfumes, deodorant             Do not wear nail polish.  Do not shave  48 hours prior to surgery.             Do not bring valuables to the hospital. [facility] IS NOT             RESPONSIBLE   FOR VALUABLES.  Contacts, dentures or bridgework may not be worn into surgery.  Leave suitcase in the car. After surgery it may be brought to your room.     Patients discharged the day of surgery will not be allowed to drive home. IF YOU ARE HAVING SURGERY AND GOING HOME THE SAME DAY, YOU MUST HAVE AN ADULT TO DRIVE YOU HOME AND BE WITH YOU FOR 24 HOURS. YOU MAY GO HOME BY TAXI OR UBER OR ORTHERWISE, BUT AN ADULT MUST ACCOMPANY YOU HOME AND STAY WITH YOU FOR 24 HOURS.    Special Instructions: N/A              Please read over the following fact sheets you were given: _____________________________________________________________________             How to Manage Your Diabetes Before and After Surgery  Why is it important to control my blood sugar before and after surgery? . Improving blood sugar levels before and after surgery helps healing and can  limit problems. . A way of improving blood sugar control is eating a healthy diet by: o  Eating less sugar and carbohydrates o  Increasing activity/exercise o  Talking with your doctor about reaching your blood sugar goals . High blood sugars (greater than 180 mg/dL) can raise your risk of infections and slow your recovery, so you will need to focus on controlling your diabetes during the weeks before surgery. . Make sure that the doctor who takes care of your diabetes knows about your planned surgery including the date and location.  How do I manage my blood sugar before surgery? . Check your blood sugar at least 4 times a day, starting 2 days before surgery, to make sure that the level is not too high or low. o Check your blood sugar the morning of your surgery when you wake up and every 2 hours until you get to  the Short Stay unit. . If your blood sugar is less than 70 mg/dL, you will need to treat for low blood sugar: o Do not take insulin. o Treat a low blood sugar (less than 70 mg/dL) with  cup of clear juice (cranberry or apple), 4 glucose tablets, OR glucose gel. o Recheck blood sugar in 15 minutes after treatment (to make sure it is greater than 70 mg/dL). If your blood sugar is not greater than 70 mg/dL on recheck, call [phone] for further instructions. . Report your blood sugar to the short stay nurse when you get to Short Stay.  . If you are admitted to the hospital after surgery: o Your blood sugar will be checked by the staff and you will probably be given insulin after surgery (instead of oral diabetes medicines) to make sure you have good blood sugar levels. o The goal for blood sugar control after surgery is 80-180 mg/dL.   WHAT DO I DO ABOUT MY DIABETES MEDICATION?  Marland Kitchen Do not take oral diabetes medicines (pills) the morning of surgery.  . THE DAY BEFORE SURGERY, take your usual dose of Metformin       Reviewed and Endorsed by [facility] Patient Education  Committee, August 2015  [facility] - Preparing for Surgery Before surgery, you can play an important role.  Because skin is not sterile, your skin needs to be as free of germs as possible.  You can reduce the number of germs on your skin by washing with CHG (chlorahexidine gluconate) soap before surgery.  CHG is an antiseptic cleaner which kills germs and bonds with the skin to continue killing germs even after washing. Please DO NOT use if you have an allergy to CHG or antibacterial soaps.  If your skin becomes reddened/irritated stop using the CHG and inform your nurse when you arrive at Short Stay. Do not shave (including legs and underarms) for at least 48 hours prior to the first CHG shower.  You may shave your face/neck. Please follow these instructions carefully:  1.  Shower with CHG Soap the night before surgery and the  morning of Surgery.  2.  If you choose to wash your hair, wash your hair first as usual with your  normal  shampoo.  3.  After you shampoo, rinse your hair and body thoroughly to remove the  shampoo.                           4.  Use CHG as you would any other liquid soap.  You can apply chg directly  to the skin and wash                       Gently with a scrungie or clean washcloth.  5.  Apply the CHG Soap to your body ONLY FROM THE NECK DOWN.   Do not use on face/ open                           Wound or open sores. Avoid contact with eyes, ears mouth and genitals (private parts).                       Wash face,  Genitals (private parts) with your normal soap.             6.  Wash thoroughly, paying special attention to the area  where your surgery  will be performed.  7.  Thoroughly rinse your body with warm water from the neck down.  8.  DO NOT shower/wash with your normal soap after using and rinsing off  the CHG Soap.                9.  Pat yourself dry with a clean towel.            10.  Wear clean pajamas.            11.  Place clean sheets on your bed the night  of your first shower and do not  sleep with pets. Day of Surgery : Do not apply any lotions/deodorants the morning of surgery.  Please wear clean clothes to the hospital/surgery center.  FAILURE TO FOLLOW THESE INSTRUCTIONS MAY RESULT IN THE CANCELLATION OF YOUR SURGERY PATIENT SIGNATURE_________________________________  NURSE SIGNATURE__________________________________  ________________________________________________________________________   Rogelia Mire  An incentive spirometer is a tool that can help keep your lungs clear and active. This tool measures how well you are filling your lungs with each breath. Taking long deep breaths may help reverse or decrease the chance of developing breathing (pulmonary) problems (especially infection) following:  A long period of time when you are unable to move or be active. BEFORE THE PROCEDURE   If the spirometer includes an indicator to show your best effort, your nurse or respiratory therapist will set it to a desired goal.  If possible, sit up straight or lean slightly forward. Try not to slouch.  Hold the incentive spirometer in an upright position. INSTRUCTIONS FOR USE  1. Sit on the edge of your bed if possible, or sit up as far as you can in bed or on a chair. 2. Hold the incentive spirometer in an upright position. 3. Breathe out normally. 4. Place the mouthpiece in your mouth and seal your lips tightly around it. 5. Breathe in slowly and as deeply as possible, raising the piston or the ball toward the top of the column. 6. Hold your breath for 3-5 seconds or for as long as possible. Allow the piston or ball to fall to the bottom of the column. 7. Remove the mouthpiece from your mouth and breathe out normally. 8. Rest for a few seconds and repeat Steps 1 through 7 at least 10 times every 1-2 hours when you are awake. Take your time and take a few normal breaths between deep breaths. 9. The spirometer may include an indicator  to show your best effort. Use the indicator as a goal to work toward during each repetition. 10. After each set of 10 deep breaths, practice coughing to be sure your lungs are clear. If you have an incision (the cut made at the time of surgery), support your incision when coughing by placing a pillow or rolled up towels firmly against it. Once you are able to get out of bed, walk around indoors and cough well. You may stop using the incentive spirometer when instructed by your caregiver.  RISKS AND COMPLICATIONS  Take your time so you do not get dizzy or light-headed.  If you are in pain, you may need to take or ask for pain medication before doing incentive spirometry. It is harder to take a deep breath if you are having pain. AFTER USE  Rest and breathe slowly and easily.  It can be helpful to keep track of a log of your progress. Your caregiver can provide you with a simple  table to help with this. If you are using the spirometer at home, follow these instructions: SEEK MEDICAL CARE IF:   You are having difficultly using the spirometer.  You have trouble using the spirometer as often as instructed.  Your pain medication is not giving enough relief while using the spirometer.  You develop fever of 100.5 F (38.1 C) or higher. SEEK IMMEDIATE MEDICAL CARE IF:   You cough up bloody sputum that had not been present before.  You develop fever of 102 F (38.9 C) or greater.  You develop worsening pain at or near the incision site. MAKE SURE YOU:   Understand these instructions.  Will watch your condition.  Will get help right away if you are not doing well or get worse. Document Released: 06/25/2006 Document Revised: 05/07/2011 Document Reviewed: 08/26/2006 Exit[employer] Patient Information 2014 Exit[employer], [place].   ________________________________________________________________________  WHAT IS A BLOOD TRANSFUSION? Blood Transfusion Information  A transfusion is the replacement  of blood or some of its parts. Blood is made up of multiple cells which provide different functions.  Red blood cells carry oxygen and are used for blood loss replacement.  White blood cells fight against infection.  Platelets control bleeding.  Plasma helps clot blood.  Other blood products are available for specialized needs, such as hemophilia or other clotting disorders. BEFORE THE TRANSFUSION  Who gives blood for transfusions?   Healthy volunteers who are fully evaluated to make sure their blood is safe. This is blood bank blood. Transfusion therapy is the safest it has ever been in the practice of medicine. Before blood is taken from a donor, a complete history is taken to make sure that person has no history of diseases nor engages in risky social behavior (examples are intravenous drug use or sexual activity with multiple partners). The donor's travel history is screened to minimize risk of transmitting infections, such as malaria. The donated blood is tested for signs of infectious diseases, such as HIV and hepatitis. The blood is then tested to be sure it is compatible with you in order to minimize the chance of a transfusion reaction. If you or a relative donates blood, this is often done in anticipation of surgery and is not appropriate for emergency situations. It takes many days to process the donated blood. RISKS AND COMPLICATIONS Although transfusion therapy is very safe and saves many lives, the main dangers of transfusion include:   Getting an infectious disease.  Developing a transfusion reaction. This is an allergic reaction to something in the blood you were given. Every precaution is taken to prevent this. The decision to have a blood transfusion has been considered carefully by your caregiver before blood is given. Blood is not given unless the benefits outweigh the risks. AFTER THE TRANSFUSION  Right after receiving a blood transfusion, you will usually feel much  better and more energetic. This is especially true if your red blood cells have gotten low (anemic). The transfusion raises the level of the red blood cells which carry oxygen, and this usually causes an energy increase.  The nurse administering the transfusion will monitor you carefully for complications. HOME CARE INSTRUCTIONS  No special instructions are needed after a transfusion. You may find your energy is better. Speak with your caregiver about any limitations on activity for underlying diseases you may have. SEEK MEDICAL CARE IF:   Your condition is not improving after your transfusion.  You develop redness or irritation at the intravenous (IV) site. SEEK IMMEDIATE MEDICAL  CARE IF:  Any of the following symptoms occur over the next 12 hours:  Shaking chills.  You have a temperature by mouth above 102 F (38.9 C), not controlled by medicine.  Chest, back, or muscle pain.  People around you feel you are not acting correctly or are confused.  Shortness of breath or difficulty breathing.  Dizziness and fainting.  You get a rash or develop hives.  You have a decrease in urine output.  Your urine turns a dark color or changes to pink, red, or brown. Any of the following symptoms occur over the next 10 days:  You have a temperature by mouth above 102 F (38.9 C), not controlled by medicine.  Shortness of breath.  Weakness after normal activity.  The white part of the eye turns yellow (jaundice).  You have a decrease in the amount of urine or are urinating less often.  Your urine turns a dark color or changes to pink, red, or brown. Document Released: 02/10/2000 Document Revised: 05/07/2011 Document Reviewed: 09/29/2007 [facility] Patient Information 2014 [place], [place].  _______________________________________________________________________

## 2018-05-08 NOTE — Progress Notes (Signed)
04-02-18 ( Epic) Cardiac Clearance from [place], [place]  03-28-18 (Epic) Stress Test  03-26-18 (Epic) EKG

## 2018-05-09 ENCOUNTER — Encounter (HOSPITAL_COMMUNITY): Payer: Self-pay

## 2018-05-09 ENCOUNTER — Other Ambulatory Visit: Payer: Self-pay

## 2018-05-09 ENCOUNTER — Encounter (HOSPITAL_COMMUNITY)
Admission: RE | Admit: 2018-05-09 | Discharge: 2018-05-09 | Disposition: A | Discharge status: Home / Self Care | Payer: Medicare HMO | Source: Ambulatory Visit | Attending: Orthopedic Surgery | Admitting: Orthopedic Surgery

## 2018-05-09 DIAGNOSIS — Z01812 Encounter for preprocedural laboratory examination: Secondary | ICD-10-CM | POA: Diagnosis present

## 2018-05-09 LAB — COMPREHENSIVE METABOLIC PANEL
ALT: 20 U/L (ref 0–44)
AST: 24 U/L (ref 15–41)
Albumin: 4.4 g/dL (ref 3.5–5.0)
Alkaline Phosphatase: 101 U/L (ref 38–126)
Anion gap: 10 (ref 5–15)
BUN: 10 mg/dL (ref 8–23)
CO2: 25 mmol/L (ref 22–32)
Calcium: 9.1 mg/dL (ref 8.9–10.3)
Chloride: 103 mmol/L (ref 98–111)
Creatinine, Ser: 0.69 mg/dL (ref 0.44–1.00)
GFR calc Af Amer: >60 mL/min (ref >60)
GFR calc non Af Amer: >60 mL/min (ref >60)
Glucose, Bld: 113 mg/dL — ABNORMAL HIGH (ref 70–99)
Potassium: 3.3 mmol/L — ABNORMAL LOW (ref 3.5–5.1)
Sodium: 138 mmol/L (ref 135–145)
Total Bilirubin: 1.2 mg/dL (ref 0.3–1.2)
Total Protein: 8.4 g/dL — ABNORMAL HIGH (ref 6.5–8.1)

## 2018-05-09 LAB — CBC WITH DIFFERENTIAL/PLATELET
Abs Immature Granulocytes: 0.02 10*3/uL (ref 0.00–0.07)
Basophils Absolute: 0 10*3/uL (ref 0.0–0.1)
Basophils Relative: 1 %
Eosinophils Absolute: 0.2 10*3/uL (ref 0.0–0.5)
Eosinophils Relative: 3 %
HCT: 42.4 % (ref 36.0–46.0)
Hemoglobin: 13.3 g/dL (ref 12.0–15.0)
Immature Granulocytes: 0 %
Lymphocytes Relative: 27 %
Lymphs Abs: 2.2 10*3/uL (ref 0.7–4.0)
MCH: 30.2 pg (ref 26.0–34.0)
MCHC: 31.4 g/dL (ref 30.0–36.0)
MCV: 96.1 fL (ref 80.0–100.0)
Monocytes Absolute: 0.6 10*3/uL (ref 0.1–1.0)
Monocytes Relative: 7 %
Neutro Abs: 5.1 10*3/uL (ref 1.7–7.7)
Neutrophils Relative %: 62 %
Platelets: 280 10*3/uL (ref 150–400)
RBC: 4.41 MIL/uL (ref 3.87–5.11)
RDW: 13.2 % (ref 11.5–15.5)
WBC: 8.2 10*3/uL (ref 4.0–10.5)
nRBC: 0 % (ref 0.0–0.2)

## 2018-05-09 LAB — URINALYSIS, ROUTINE W REFLEX MICROSCOPIC
Bilirubin Urine: NEGATIVE
Glucose, UA: NEGATIVE mg/dL
Hgb urine dipstick: NEGATIVE
Ketones, ur: NEGATIVE mg/dL
Leukocytes,Ua: NEGATIVE
Nitrite: NEGATIVE
Protein, ur: 100 mg/dL — AB
SPECIFIC GRAVITY, URINE: 1.015 (ref 1.005–1.030)
pH: 6 (ref 5.0–8.0)

## 2018-05-09 LAB — APTT: aPTT: 24 seconds (ref 24–36)

## 2018-05-09 LAB — SURGICAL PCR SCREEN
MRSA, PCR: NEGATIVE
Staphylococcus aureus: NEGATIVE

## 2018-05-09 LAB — ABO/RH: ABO/RH(D): O POS

## 2018-05-09 LAB — TYPE AND SCREEN
ABO/RH(D): O POS
Antibody Screen: NEGATIVE

## 2018-05-09 LAB — PROTIME-INR
INR: 0.9 (ref 0.8–1.2)
Prothrombin Time: 12 seconds (ref 11.4–15.2)

## 2018-05-09 LAB — HEMOGLOBIN A1C
Hgb A1c MFr Bld: 6.5 % — ABNORMAL HIGH (ref 4.8–5.6)
Mean Plasma Glucose: 139.85 mg/dL

## 2018-05-09 LAB — GLUCOSE, CAPILLARY: Glucose-Capillary: 98 mg/dL (ref 70–99)

## 2018-05-09 NOTE — Progress Notes (Signed)
05-09-18 UA result routed to Dr. Madelon Lips for review

## 2018-05-09 NOTE — Progress Notes (Signed)
PCP: Julieanne Manson  CARDIOLOGIST: Dr. Allyson Sabal   INFO IN Epic:  04-02-18 [facility]) Cardiac Clearance from [place], [place]  03-28-18 (Epic) Stress Test  03-26-18 (Epic) EKG  INFO ON CHART:  BLOOD THINNERS AND LAST DOSES: 81 ASA, Plavix Pt to hold 5 days prior to surgery. ____________________________________  PATIENT SYMPTOMS AT TIME OF PREOP:  HTN, DM2, Sleep Apnea (No use of CPAP machine)

## 2018-05-10 LAB — URINE CULTURE: Culture: NO GROWTH

## 2018-06-10 ENCOUNTER — Other Ambulatory Visit: Payer: Self-pay

## 2018-06-10 DIAGNOSIS — E1142 Type 2 diabetes mellitus with diabetic polyneuropathy: Secondary | ICD-10-CM

## 2018-06-10 DIAGNOSIS — M1A0710 Idiopathic chronic gout, right ankle and foot, without tophus (tophi): Secondary | ICD-10-CM

## 2018-06-10 MED ORDER — COLCHICINE 0.6 MG PO TABS: 0.6000 mg | ORAL_TABLET | Freq: Two times a day (BID) | ORAL | 3 refills | Status: DC | PRN

## 2018-06-10 MED ORDER — BLOOD GLUCOSE MONITOR KIT: PACK | 0 refills | Status: DC

## 2018-06-10 MED ORDER — COLCRYS 0.6 MG PO TABS: ORAL_TABLET | ORAL | 5 refills | Status: DC

## 2018-06-18 ENCOUNTER — Telehealth: Payer: Self-pay | Admitting: Internal Medicine

## 2018-06-19 NOTE — Telephone Encounter (Signed)
Rx refaxed to [facility]

## 2018-06-20 ENCOUNTER — Encounter (HOSPITAL_COMMUNITY): Admission: RE | Payer: Self-pay | Source: Home / Self Care

## 2018-06-20 ENCOUNTER — Inpatient Hospital Stay (HOSPITAL_COMMUNITY): Admission: RE | Admit: 2018-06-20 | Payer: Medicare HMO | Source: Home / Self Care | Admitting: Orthopedic Surgery

## 2018-06-20 SURGERY — ARTHROPLASTY, HIP, TOTAL,POSTERIOR APPROACH
Anesthesia: Choice | Laterality: Right

## 2018-06-24 ENCOUNTER — Encounter: Payer: Medicare HMO | Admitting: Internal Medicine

## 2018-06-28 ENCOUNTER — Other Ambulatory Visit: Payer: Self-pay | Admitting: Internal Medicine

## 2018-06-28 DIAGNOSIS — I739 Peripheral vascular disease, unspecified: Principal | ICD-10-CM

## 2018-06-28 DIAGNOSIS — I779 Disorder of arteries and arterioles, unspecified: Secondary | ICD-10-CM

## 2018-06-28 DIAGNOSIS — M1A0710 Idiopathic chronic gout, right ankle and foot, without tophus (tophi): Secondary | ICD-10-CM

## 2018-06-30 NOTE — Telephone Encounter (Signed)
Se phone note.

## 2018-07-18 ENCOUNTER — Other Ambulatory Visit: Payer: Self-pay | Admitting: Internal Medicine

## 2018-08-02 ENCOUNTER — Other Ambulatory Visit: Payer: Self-pay | Admitting: Internal Medicine

## 2018-08-02 DIAGNOSIS — E1142 Type 2 diabetes mellitus with diabetic polyneuropathy: Secondary | ICD-10-CM

## 2018-08-06 ENCOUNTER — Encounter: Payer: Self-pay | Admitting: Internal Medicine

## 2018-08-06 ENCOUNTER — Ambulatory Visit: Payer: Medicare HMO | Admitting: Internal Medicine

## 2018-08-06 ENCOUNTER — Other Ambulatory Visit: Payer: Self-pay

## 2018-08-06 VITALS — BP 130/80 | HR 72 | Resp 12 | Ht 63.0 in | Wt 160.0 lb

## 2018-08-06 DIAGNOSIS — E1142 Type 2 diabetes mellitus with diabetic polyneuropathy: Secondary | ICD-10-CM | POA: Diagnosis not present

## 2018-08-06 DIAGNOSIS — E010 Iodine-deficiency related diffuse (endemic) goiter: Secondary | ICD-10-CM | POA: Diagnosis not present

## 2018-08-06 DIAGNOSIS — G5603 Carpal tunnel syndrome, bilateral upper limbs: Secondary | ICD-10-CM | POA: Insufficient documentation

## 2018-08-06 DIAGNOSIS — R0989 Other specified symptoms and signs involving the circulatory and respiratory systems: Secondary | ICD-10-CM

## 2018-08-06 DIAGNOSIS — Z0000 Encounter for general adult medical examination without abnormal findings: Secondary | ICD-10-CM | POA: Diagnosis not present

## 2018-08-06 DIAGNOSIS — E785 Hyperlipidemia, unspecified: Secondary | ICD-10-CM

## 2018-08-06 DIAGNOSIS — M1611 Unilateral primary osteoarthritis, right hip: Secondary | ICD-10-CM | POA: Diagnosis present

## 2018-08-06 DIAGNOSIS — I10 Essential (primary) hypertension: Secondary | ICD-10-CM

## 2018-08-06 DIAGNOSIS — E1169 Type 2 diabetes mellitus with other specified complication: Secondary | ICD-10-CM | POA: Diagnosis not present

## 2018-08-06 NOTE — Progress Notes (Signed)
Subjective:    Patient ID: April Shaffer, female   DOB: 05/04/1950, 68 y.o.   MRN: [id number]   HPI   CPE with pap  1.  Pap:  Never had an abnormality prior to hysterectomy years ago.  No family history of cervical cancer.   2.  Mammogram:  Last 04/15/2018 and normal.  No family history of breast cancer.  3.  Osteoprevention:  Does not eat or drink much in way of dairy.  Not supplementing.  Normal DEXA in 06/24/2017 Not physically active with pandemic and hip pain.   She may look into stationary bike.    4.  Guaiac Cards:  Performed before.  Negative.  5.  Colonoscopy:  Had colonoscopy in 11/2017 and 2 adenomatous polyps found.  Repeat planned for 2024.  No family history of colon cancer.  Dr. Lorie Apley office, though a female physician.  6.  Immunizations:  Up to date Immunization History  Administered Date(s) Administered  . Influenza Inj Mdck Quad Pf 01/31/2016  . Influenza Split 12/10/2011  . Influenza Whole 12/28/2007, 11/26/2008, 02/03/2010  . Influenza, High Dose Seasonal PF 11/24/2017  . Influenza,inj,Quad PF,6+ Mos 12/14/2014, 11/25/2016  . Influenza-Unspecified 12/21/2013, 11/24/2016  . Pneumococcal Conjugate-13 04/15/2014  . Pneumococcal Polysaccharide-23 02/27/2003, 02/03/2010, 06/17/2017  . Tdap 08/08/2010  . Zoster Recombinat (Shingrix) 12/23/2017, 03/11/2018     7.  Glucose/Cholesterol:  Last A1C in March 2020 was 6.5%.  Historically well controlled. Cholesterol in January showed LDL was still a bit high on high dose Atorvastatin.  Dr. Gwenlyn Found, Cardiology added Zetia then.  She has not been able to follow up since due to pandemic. Lipid Panel     Component Value Date/Time   CHOL 188 03/05/2018 1140   TRIG 91 03/05/2018 1140   HDL 67 03/05/2018 1140   CHOLHDL 3 12/07/2014 0806   VLDL 15.0 12/07/2014 0806   LDLCALC 103 (H) 03/05/2018 1140   LDLDIRECT 139.7 03/28/2012 1033    Current Meds  Medication Sig  . acetaminophen (TYLENOL) 650 MG CR tablet Take  1,300 mg by mouth daily.  Marland Kitchen allopurinol (ZYLOPRIM) 300 MG tablet Take 1 tablet by mouth once daily  . amLODipine (NORVASC) 10 MG tablet Take 1 tablet by mouth once daily  . aspirin EC 81 MG tablet Take 81 mg by mouth daily.  Marland Kitchen atorvastatin (LIPITOR) 80 MG tablet TAKE 1 TABLET BY MOUTH ONCE DAILY WITH  EVENING  MEAL  . blood glucose meter kit and supplies KIT Check sugars twice daily  . carvedilol (COREG CR) 20 MG 24 hr capsule Take 1 capsule by mouth once daily  . clopidogrel (PLAVIX) 75 MG tablet Take 1 tablet by mouth once daily with breakfast  . COLCRYS 0.6 MG tablet 1 by mouth twice a day as needed for gout attacks  . cyclobenzaprine (FLEXERIL) 5 MG tablet Take 1 tablet (5 mg total) by mouth 3 (three) times daily as needed for muscle spasms.  . fexofenadine (ALLEGRA) 180 MG tablet Take 1 tablet (180 mg total) by mouth daily. (Patient taking differently: Take 180 mg by mouth daily as needed for allergies. )  . gabapentin (NEURONTIN) 300 MG capsule TAKE 1 CAPSULE BY MOUTH THREE TIMES DAILY  . glucose blood test strip Check sugars twice daily  . Liniments (SALONPAS PAIN RELIEF PATCH EX) Place 1 patch onto the skin daily as needed.   . metFORMIN (GLUCOPHAGE-XR) 500 MG 24 hr tablet TAKE ONE TABLET BY MOUTH TWICE DAILY WITH MEALS (Patient taking differently: Take  1,000 mg by mouth daily. )  . omega-3 acid ethyl esters (LOVAZA) 1 g capsule Take 1 capsule (1 g total) by mouth 2 (two) times daily.   Allergies  Allergen Reactions  . Lisinopril-Hydrochlorothiazide Other (See Comments)    Angioedema  - Tongue swelling   . Codeine Itching  . Oxycodone Itching  . Simvastatin Other (See Comments)    Muscle pain  . Tramadol Itching   Past Medical History:  Diagnosis Date  . Arthritis   . Carotid artery narrowing   . Coronary artery disease    Cardiac catheterization November 2013: 50% ostial LAD stenosis 50% mid stenosis. 30% disease in the left circumflex.  . Diabetes mellitus, type 2 ([place])    . Hyperlipidemia   . Hypertension   . Onychomycosis of toenail 07/31/2016  . Sleep apnea    hx had tonsilectomy and no longer has     Past Surgical History:  Procedure Laterality Date  . CARDIAC CATHETERIZATION    . ENDARTERECTOMY  09/27/2011   Procedure: RIGT ENDARTERECTOMY CAROTID;  Surgeon: Mal Misty, MD;  Location: [facility];  Service: Vascular;  Laterality: Right;  . EPIDURAL BLOCK INJECTION  02/2008   Drs. Eulis Manly  . gyn surgery  2004   total hysterectomy for mennorhagia,,salpingoophorectomy  . TONSILLECTOMY    . TOTAL ABDOMINAL HYSTERECTOMY W/ BILATERAL SALPINGOOPHORECTOMY Bilateral 2000  . VARICOSE VEIN SURGERY  2008   stripping   Family History  Problem Relation Age of Onset  . Hypertension Sister   . Hypothyroidism Sister   . Cancer Maternal Grandmother        lung  . Hypertension Son   . Hypertension Sister   . Diabetes Brother        lost toe in 2018 with new diagnosis of DM  . Hypertension Son    Social History   Socioeconomic History  . Marital status: Divorced    Spouse name: Vickey Sages  . Number of children: 2  . Years of education: 40  . Highest education level: Not on file  Occupational History  . 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    Comment: [employer]-retired 07/23/2014  Social Needs  . Financial resource strain: Not hard at all  . Food insecurity:    Worry: Never true    Inability: Never true  . Transportation needs:    Medical: No    Non-medical: No  Tobacco Use  . Smoking status: Light Tobacco Smoker    Packs/day: 0.10    Years: 25.00    Pack years: 2.50    Types: Cigarettes    Start date: 05/31/1968  . Smokeless tobacco: Never Used  . Tobacco comment: pt states she smokes about 4-5 cigs per --states she contiues to work on it.  No interest in other help now.  Substance and Sexual Activity  . Alcohol use: Yes    Alcohol/week: 2.0 standard drinks    Types: 2 Shots of liquor per  week    Comment: 2 drinks a day-whiskey  . Drug use: No  . Sexual activity: Not Currently    Birth control/protection: Surgical  Lifestyle  . Physical activity:    Days per week: 0 days    Minutes per session: 0 min  . Stress: Not at all  Relationships  . Social connections:    Talks on phone: More than three times a week    Gets together: More than three times a week    Attends  religious service: Not on file    Active member of club or organization: No    Attends meetings of clubs or organizations: Never    Relationship status: Living with partner  . Intimate partner violence:    Fear of current or ex partner: No    Emotionally abused: No    Physically abused: No    Forced sexual activity: No  Other Topics Concern  . Not on file  Social History Narrative   Lives with long term boyfriend (28 years together)   Both sons live in [place]     Review of Systems  Constitutional: Negative for appetite change, fatigue, fever and unexpected weight change.  HENT: Positive for dental problem (Plans to get her dental issues taken care of after her surgery for hip.  Needs partials.  ). Negative for ear pain, hearing loss, rhinorrhea, sinus pressure, sinus pain and sore throat.   Eyes: Negative for visual disturbance (Stable with bifocals and has cataracts.  ).  Respiratory: Negative for cough and shortness of breath.   Cardiovascular: Negative for chest pain, palpitations and leg swelling.  Gastrointestinal: Negative for abdominal pain, blood in stool (No melena) and constipation.  Genitourinary: Negative for dysuria, frequency, urgency and vaginal discharge.  Musculoskeletal:       Left shoulder hurts at times, but topical gel controls. Using lidocaine patches for her right hip pain.  Neurological: Positive for numbness (hands numb at night last 2 months.  Right middle finger always numb.  No recent repetitive movements of hands.). Negative for weakness and headaches.   Psychiatric/Behavioral: Negative for dysphoric mood. The patient is not nervous/anxious.       Objective:   BP 130/80 (BP Location: Left Arm, Patient Position: Sitting, Cuff Size: Normal)   Pulse 72   Resp 12   Ht '5\' 3"'  (1.6 m)   Wt 160 lb (72.6 kg)   BMI 28.34 kg/m   Physical Exam  Constitutional: She is oriented to person, place, and time. She appears well-developed and well-nourished.  HENT:  Head: Normocephalic and atraumatic.  Right Ear: Hearing, tympanic membrane, external ear and ear canal normal.  Left Ear: Hearing, tympanic membrane, external ear and ear canal normal.  Nose: Nose normal.  Mouth/Throat: Uvula is midline, oropharynx is clear and moist and mucous membranes are normal.  Upper gingival ridge hypertrophied.  Multiple teeth missing or loose.  Eyes: Pupils are equal, round, and reactive to light. Conjunctivae and EOM are normal.  Unable to view optic discs well due to cataracts.  Neck: Normal range of motion and full passive range of motion without pain. Neck supple. Thyroid mass (nodular thyroid with more prominence on left lobe. ) and thyromegaly present.  Cardiovascular: Normal rate, regular rhythm, S1 normal and S2 normal. Exam reveals no S3, no S4 and no friction rub.  No murmur heard. No right carotid bruit where had endarterectomy previously, but +bruit on left.  Carotid, radial , femoral pulses normal and equal.  DP and PT pulses mildly decreased bilaterally.  Pulmonary/Chest: Effort normal and breath sounds normal. Right breast exhibits no inverted nipple, no mass, no nipple discharge, no skin change and no tenderness. Left breast exhibits no inverted nipple, no mass, no nipple discharge, no skin change and no tenderness.  Abdominal: Soft. Bowel sounds are normal. She exhibits no mass. There is no hepatosplenomegaly. There is no abdominal tenderness. No hernia.  Genitourinary:    Genitourinary Comments: Deferred due to history of TAH/BSO    Musculoskeletal: Normal range  of motion.  Lymphadenopathy:       Head (right side): No submental and no submandibular adenopathy present.       Head (left side): No submental and no submandibular adenopathy present.    She has no cervical adenopathy.    She has no axillary adenopathy.       Right: No inguinal and no supraclavicular adenopathy present.       Left: No inguinal and no supraclavicular adenopathy present.  Neurological: She is alert and oriented to person, place, and time. She has normal strength and normal reflexes. No cranial nerve deficit or sensory deficit. Coordination and gait normal.  + Tinels and Phalens over bilateral volar wrists/median nerves  Diabetic Foot Exam - Simple   Simple Foot Form Diabetic Foot exam was performed with the following findings:  Yes  08/06/2018 11:39 AM  Visual Inspection See comments:  Yes Sensation Testing Intact to touch and monofilament testing bilaterally:  Yes Pulse Check See comments:  Yes Comments Varicosities and mild venous congestion with cyanosis with feet dangling.   Feet cool.  No skin lesions.  Decreased PT and DP pulses bilaterally.     Skin: Skin is warm. No rash noted.  Psychiatric: She has a normal mood and affect. Her speech is normal and behavior is normal. Judgment and thought content normal. Cognition and memory are normal.     Assessment & Plan   1.  CPE without pap Mammogram due next 03/2019 Will have labs performed next week for upcoming hip replacement surgery. Guaiac cards x 3 to return in 2 weeks.  2.  DM:  A1C, urine microalbumin/crea  3.  Hyperlipidemia:  Atorvastatin and Zetia.  FLP   4.  Tobacco use:  Encouraged calling Quitline for nicotine gum or lozenges.  Encouraged in particular to quit smoking now to improve pulmonary status prior to surgery end of June. Information on quitting given.  5.  Nodular thyroid:  Ultrasound evaluation put on hold with COVID19.  Will reappoint when stable postop  from hip.  6.  Carpal Tunnel Syndrome:  To obtain bilateral cock up splints and wear nightly until controlled.  Then as needed.  Pay attention to repetitive actions of hands and wrists as to what may be causing.  7.  Hypertension:  Improved control

## 2018-08-06 NOTE — Patient Instructions (Addendum)
Can google "advance directives, [place]"  And bring up form from Secretary of State. Print and fill out Or can go to "5 wishes"  Which is also in Spanish and fill out--this costs $5--perhaps easier to use. Designate a Medical Power of Attorney to speak for you if you are unable to speak for yourself when ill or injured   Tobacco Cessation:   [id number] or [phone], the former for support and possibly free nicotine patches/gum and support; the latter for [facility] Smoking cessation class. Get rid of all smoking supplies:  Cigarettes, lighters, ashtrays--no stashes just in case at home if you are serious.  

## 2018-08-07 LAB — MICROALBUMIN / CREATININE URINE RATIO
Creatinine, Urine: 106.2 mg/dL
Microalb/Creat Ratio: 451 mg/g creat — ABNORMAL HIGH (ref 0–29)
Microalbumin, Urine: 478.7 ug/mL

## 2018-08-07 LAB — LIPID PANEL W/O CHOL/HDL RATIO
Cholesterol, Total: 137 mg/dL (ref 100–199)
HDL: 48 mg/dL (ref >39)
LDL Calculated: 71 mg/dL (ref 0–99)
Triglycerides: 88 mg/dL (ref 0–149)
VLDL Cholesterol Cal: 18 mg/dL (ref 5–40)

## 2018-08-07 LAB — HGB A1C W/O EAG: Hgb A1c MFr Bld: 6.1 % — ABNORMAL HIGH (ref 4.8–5.6)

## 2018-08-11 ENCOUNTER — Ambulatory Visit: Payer: Self-pay | Admitting: Physician Assistant

## 2018-08-11 NOTE — H&P (View-Only) (Signed)
TOTAL HIP ADMISSION H&P  Patient is admitted for right total hip arthroplasty.  Subjective:  Chief Complaint: right hip pain  HPI: April Shaffer, 67 y.o. female, has a history of pain and functional disability in the right hip(s) due to arthritis and patient has failed non-surgical conservative treatments for greater than 12 weeks to include NSAID's and/or analgesics and activity modification.  Onset of symptoms was gradual starting 4 years ago with gradually worsening course since that time.The patient noted no past surgery on the right hip(s).  Patient currently rates pain in the right hip at 9 out of 10 with activity. Patient has night pain, worsening of pain with activity and weight bearing, trendelenberg gait, pain that interfers with activities of daily living and pain with passive range of motion. Patient has evidence of periarticular osteophytes and joint space narrowing by imaging studies. This condition presents safety issues increasing the risk of falls.  There is no current active infection.  Patient Active Problem List   Diagnosis Date Noted  . Preoperative clearance 03/26/2018  . Upper back pain on left side 10/30/2016  . Onychomycosis of toenail 07/31/2016  . Encounter for postoperative carotid endarterectomy surveillance 12/07/2015  . Varicosities of leg 06/03/2015  . Acute left lumbar radiculopathy 04/15/2014  . Chronic sciatica of right side 03/17/2013  . Diabetic peripheral neuropathy (HCC) 03/17/2013  . Coronary artery disease   . Carotid artery stenosis s/p CEA 2013 10/16/2011  . Carotid artery occlusion without infarction, right 09/17/2011  . Carotid bruit 08/28/2011  . Gout 04/13/2011  . DM type 2 with diabetic peripheral neuropathy (HCC) 04/02/2008  . Hyperlipidemia associated with type 2 diabetes mellitus (HCC) 04/02/2008  . Essential hypertension 04/02/2008  . DEGENERATIVE DISC DISEASE, LUMBOSACRAL SPINE 04/02/2008   Past Medical History:  Diagnosis Date   . Arthritis   . Carotid artery narrowing   . Coronary artery disease    Cardiac catheterization November 2013: 50% ostial LAD stenosis 50% mid stenosis. 30% disease in the left circumflex.  . Diabetes mellitus, type 2 (HCC)   . Hyperlipidemia   . Hypertension   . Onychomycosis of toenail 07/31/2016  . Sleep apnea    hx had tonsilectomy and no longer has     Past Surgical History:  Procedure Laterality Date  . CARDIAC CATHETERIZATION    . CAROTID ENDARTERECTOMY Right 09-27-11   cea  . ENDARTERECTOMY  09/27/2011   Procedure: RIGT ENDARTERECTOMY CAROTID;  Surgeon: James D Lawson, MD;  Location: [facility];  Service: Vascular;  Laterality: Right;  . EPIDURAL BLOCK INJECTION  02/2008   Drs. Blackwell, Neudelman  . gyn surgery  2004   total hysterectomy for mennorhagia,,salpingoophorectomy  . TONSILLECTOMY    . TOTAL ABDOMINAL HYSTERECTOMY W/ BILATERAL SALPINGOOPHORECTOMY Bilateral 2000  . VARICOSE VEIN SURGERY  2008   stripping    Current Outpatient Medications  Medication Sig Dispense Refill Last Dose  . acetaminophen (TYLENOL) 650 MG CR tablet Take 1,300 mg by mouth daily.     . allopurinol (ZYLOPRIM) 300 MG tablet Take 1 tablet (300 mg total) by mouth daily. 30 tablet 11 Taking  . amLODipine (NORVASC) 10 MG tablet Take 1 tablet (10 mg total) by mouth daily. 30 tablet 11 Taking  . aspirin EC 81 MG tablet Take 81 mg by mouth daily.     . atorvastatin (LIPITOR) 80 MG tablet 1 tab by mouth daily with evening meal (Patient taking differently: Take 80 mg by mouth daily. ) 30 tablet 11 Taking  . blood   glucose meter kit and supplies KIT Check sugars twice daily 1 each 0 Taking  . carvedilol (COREG CR) 20 MG 24 hr capsule 1 cap by mouth daily (Patient taking differently: Take 20 mg by mouth daily. ) 30 capsule 11 Taking  . clopidogrel (PLAVIX) 75 MG tablet TAKE ONE TABLET BY MOUTH ONCE DAILY WITH BREAKFAST (Patient taking differently: Take 75 mg by mouth daily with breakfast. ) 30 tablet 11 Taking  .  colchicine 0.6 MG tablet 1 tab by mouth twice daily only when needed for gouty attack (Patient taking differently: Take 0.6 mg by mouth 2 (two) times daily as needed (gout attacks). ) 60 tablet 4 Taking  . cyclobenzaprine (FLEXERIL) 5 MG tablet Take 1 tablet (5 mg total) by mouth 3 (three) times daily as needed for muscle spasms. 20 tablet 1 Taking  . ezetimibe (ZETIA) 10 MG tablet Take 1 tablet (10 mg total) by mouth daily. 90 tablet 3   . fexofenadine (ALLEGRA) 180 MG tablet Take 1 tablet (180 mg total) by mouth daily. (Patient taking differently: Take 180 mg by mouth daily as needed for allergies. )   Taking  . gabapentin (NEURONTIN) 300 MG capsule 1 cap by mouth three times daily (Patient taking differently: Take 300 mg by mouth 3 (three) times daily as needed (pain). ) 90 capsule 11 Taking  . glucose blood test strip Check sugars twice daily 100 each 11 Taking  . Liniments (SALONPAS PAIN RELIEF PATCH EX) Place 1 patch onto the skin daily as needed.    Taking  . metFORMIN (GLUCOPHAGE-XR) 500 MG 24 hr tablet TAKE ONE TABLET BY MOUTH TWICE DAILY WITH MEALS (Patient taking differently: Take 1,000 mg by mouth daily. ) 180 tablet 3 Taking  . omega-3 acid ethyl esters (LOVAZA) 1 g capsule Take 1 capsule (1 g total) by mouth 2 (two) times daily. (Patient not taking: Reported on 05/06/2018) 60 capsule 11 Not Taking at Unknown time   No current facility-administered medications for this visit.    Allergies  Allergen Reactions  . Lisinopril-Hydrochlorothiazide Other (See Comments)    Angioedema  - Tongue swelling   . Codeine Itching  . Oxycodone Itching  . Simvastatin Other (See Comments)    Muscle pain  . Tramadol Itching    Social History   Tobacco Use  . Smoking status: Light Tobacco Smoker    Packs/day: 0.10    Years: 25.00    Pack years: 2.50    Types: Cigarettes    Start date: 05/31/1968  . Smokeless tobacco: Never Used  . Tobacco comment: pt states she smokes about 3-4 cigs per --states  she contiues to work on it.  No interest in other help now.  Substance Use Topics  . Alcohol use: Yes    Alcohol/week: 2.0 standard drinks    Types: 2 Shots of liquor per week    Comment: 2 drinks a day    Family History  Problem Relation Age of Onset  . Hypertension Sister   . Hypothyroidism Sister   . Cancer Maternal Grandmother        lung  . Hypertension Son   . Hypertension Sister   . Diabetes Brother        lost toe in 2018 with new diagnosis of DM  . Hypertension Son      Review of Systems  Musculoskeletal: Positive for joint pain.  Neurological: Positive for headaches.  All other systems reviewed and are negative.   Objective:  Physical Exam  Constitutional:   She is oriented to person, place, and time. She appears well-developed and well-nourished. No distress.  HENT:  Head: Normocephalic.  Eyes: Pupils are equal, round, and reactive to light. Conjunctivae and EOM are normal.  Neck: Normal range of motion. Neck supple.  Cardiovascular: Normal rate, regular rhythm, normal heart sounds and intact distal pulses.  Respiratory: Effort normal and breath sounds normal. No respiratory distress. She has no wheezes.  GI: Soft. Bowel sounds are normal. She exhibits no distension. There is no abdominal tenderness.  Musculoskeletal:     Right hip: She exhibits decreased range of motion, decreased strength, tenderness and bony tenderness.  Lymphadenopathy:    She has no cervical adenopathy.  Neurological: She is alert and oriented to person, place, and time.  Skin: Skin is warm and dry. No rash noted. No erythema.  Psychiatric: She has a normal mood and affect. Her behavior is normal.    Vital signs in last 24 hours: @VSRANGES@  Labs:   Estimated body mass index is 28.7 kg/m as calculated from the following:   Height as of 03/28/18: 5' 3" (1.6 m).   Weight as of 03/28/18: 73.5 kg.   Imaging Review Plain radiographs demonstrate severe degenerative joint disease of the  right hip(s). The bone quality appears to be good for age and reported activity level.      Assessment/Plan:  End stage arthritis, right hip(s)  The patient history, physical examination, clinical judgement of the provider and imaging studies are consistent with end stage degenerative joint disease of the right hip(s) and total hip arthroplasty is deemed medically necessary. The treatment options including medical management, injection therapy, arthroscopy and arthroplasty were discussed at length. The risks and benefits of total hip arthroplasty were presented and reviewed. The risks due to aseptic loosening, infection, stiffness, dislocation/subluxation,  thromboembolic complications and other imponderables were discussed.  The patient acknowledged the explanation, agreed to proceed with the plan and consent was signed. Patient is being admitted for inpatient treatment for surgery, pain control, PT, OT, prophylactic antibiotics, VTE prophylaxis, progressive ambulation and ADL's and discharge planning.The patient is planning to be discharged home with home health services    

## 2018-08-11 NOTE — H&P (Signed)
TOTAL HIP ADMISSION H&P  Patient is admitted for right total hip arthroplasty.  Subjective:  Chief Complaint: right hip pain  HPI: April Shaffer, 67 y.o. female, has a history of pain and functional disability in the right hip(s) due to arthritis and patient has failed non-surgical conservative treatments for greater than 12 weeks to include NSAID's and/or analgesics and activity modification.  Onset of symptoms was gradual starting 4 years ago with gradually worsening course since that time.The patient noted no past surgery on the right hip(s).  Patient currently rates pain in the right hip at 9 out of 10 with activity. Patient has night pain, worsening of pain with activity and weight bearing, trendelenberg gait, pain that interfers with activities of daily living and pain with passive range of motion. Patient has evidence of periarticular osteophytes and joint space narrowing by imaging studies. This condition presents safety issues increasing the risk of falls.  There is no current active infection.  Patient Active Problem List   Diagnosis Date Noted  . Preoperative clearance 03/26/2018  . Upper back pain on left side 10/30/2016  . Onychomycosis of toenail 07/31/2016  . Encounter for postoperative carotid endarterectomy surveillance 12/07/2015  . Varicosities of leg 06/03/2015  . Acute left lumbar radiculopathy 04/15/2014  . Chronic sciatica of right side 03/17/2013  . Diabetic peripheral neuropathy (HCC) 03/17/2013  . Coronary artery disease   . Carotid artery stenosis s/p CEA 2013 10/16/2011  . Carotid artery occlusion without infarction, right 09/17/2011  . Carotid bruit 08/28/2011  . Gout 04/13/2011  . DM type 2 with diabetic peripheral neuropathy (HCC) 04/02/2008  . Hyperlipidemia associated with type 2 diabetes mellitus (HCC) 04/02/2008  . Essential hypertension 04/02/2008  . DEGENERATIVE DISC DISEASE, LUMBOSACRAL SPINE 04/02/2008   Past Medical History:  Diagnosis Date   . Arthritis   . Carotid artery narrowing   . Coronary artery disease    Cardiac catheterization November 2013: 50% ostial LAD stenosis 50% mid stenosis. 30% disease in the left circumflex.  . Diabetes mellitus, type 2 (HCC)   . Hyperlipidemia   . Hypertension   . Onychomycosis of toenail 07/31/2016  . Sleep apnea    hx had tonsilectomy and no longer has     Past Surgical History:  Procedure Laterality Date  . CARDIAC CATHETERIZATION    . CAROTID ENDARTERECTOMY Right 09-27-11   cea  . ENDARTERECTOMY  09/27/2011   Procedure: RIGT ENDARTERECTOMY CAROTID;  Surgeon: James D Lawson, MD;  Location: [facility] OR;  Service: Vascular;  Laterality: Right;  . EPIDURAL BLOCK INJECTION  02/2008   Drs. Blackwell, Neudelman  . gyn surgery  2004   total hysterectomy for mennorhagia,,salpingoophorectomy  . TONSILLECTOMY    . TOTAL ABDOMINAL HYSTERECTOMY W/ BILATERAL SALPINGOOPHORECTOMY Bilateral 2000  . VARICOSE VEIN SURGERY  2008   stripping    Current Outpatient Medications  Medication Sig Dispense Refill Last Dose  . acetaminophen (TYLENOL) 650 MG CR tablet Take 1,300 mg by mouth daily.     . allopurinol (ZYLOPRIM) 300 MG tablet Take 1 tablet (300 mg total) by mouth daily. 30 tablet 11 Taking  . amLODipine (NORVASC) 10 MG tablet Take 1 tablet (10 mg total) by mouth daily. 30 tablet 11 Taking  . aspirin EC 81 MG tablet Take 81 mg by mouth daily.     . atorvastatin (LIPITOR) 80 MG tablet 1 tab by mouth daily with evening meal (Patient taking differently: Take 80 mg by mouth daily. ) 30 tablet 11 Taking  . blood   glucose meter kit and supplies KIT Check sugars twice daily 1 each 0 Taking  . carvedilol (COREG CR) 20 MG 24 hr capsule 1 cap by mouth daily (Patient taking differently: Take 20 mg by mouth daily. ) 30 capsule 11 Taking  . clopidogrel (PLAVIX) 75 MG tablet TAKE ONE TABLET BY MOUTH ONCE DAILY WITH BREAKFAST (Patient taking differently: Take 75 mg by mouth daily with breakfast. ) 30 tablet 11 Taking  .  colchicine 0.6 MG tablet 1 tab by mouth twice daily only when needed for gouty attack (Patient taking differently: Take 0.6 mg by mouth 2 (two) times daily as needed (gout attacks). ) 60 tablet 4 Taking  . cyclobenzaprine (FLEXERIL) 5 MG tablet Take 1 tablet (5 mg total) by mouth 3 (three) times daily as needed for muscle spasms. 20 tablet 1 Taking  . ezetimibe (ZETIA) 10 MG tablet Take 1 tablet (10 mg total) by mouth daily. 90 tablet 3   . fexofenadine (ALLEGRA) 180 MG tablet Take 1 tablet (180 mg total) by mouth daily. (Patient taking differently: Take 180 mg by mouth daily as needed for allergies. )   Taking  . gabapentin (NEURONTIN) 300 MG capsule 1 cap by mouth three times daily (Patient taking differently: Take 300 mg by mouth 3 (three) times daily as needed (pain). ) 90 capsule 11 Taking  . glucose blood test strip Check sugars twice daily 100 each 11 Taking  . Liniments (SALONPAS PAIN RELIEF PATCH EX) Place 1 patch onto the skin daily as needed.    Taking  . metFORMIN (GLUCOPHAGE-XR) 500 MG 24 hr tablet TAKE ONE TABLET BY MOUTH TWICE DAILY WITH MEALS (Patient taking differently: Take 1,000 mg by mouth daily. ) 180 tablet 3 Taking  . omega-3 acid ethyl esters (LOVAZA) 1 g capsule Take 1 capsule (1 g total) by mouth 2 (two) times daily. (Patient not taking: Reported on 05/06/2018) 60 capsule 11 Not Taking at Unknown time   No current facility-administered medications for this visit.    Allergies  Allergen Reactions  . Lisinopril-Hydrochlorothiazide Other (See Comments)    Angioedema  - Tongue swelling   . Codeine Itching  . Oxycodone Itching  . Simvastatin Other (See Comments)    Muscle pain  . Tramadol Itching    Social History   Tobacco Use  . Smoking status: Light Tobacco Smoker    Packs/day: 0.10    Years: 25.00    Pack years: 2.50    Types: Cigarettes    Start date: 05/31/1968  . Smokeless tobacco: Never Used  . Tobacco comment: pt states she smokes about 3-4 cigs per --states  she contiues to work on it.  No interest in other help now.  Substance Use Topics  . Alcohol use: Yes    Alcohol/week: 2.0 standard drinks    Types: 2 Shots of liquor per week    Comment: 2 drinks a day    Family History  Problem Relation Age of Onset  . Hypertension Sister   . Hypothyroidism Sister   . Cancer Maternal Grandmother        lung  . Hypertension Son   . Hypertension Sister   . Diabetes Brother        lost toe in 2018 with new diagnosis of DM  . Hypertension Son      Review of Systems  Musculoskeletal: Positive for joint pain.  Neurological: Positive for headaches.  All other systems reviewed and are negative.   Objective:  Physical Exam  Constitutional:   She is oriented to person, place, and time. She appears well-developed and well-nourished. No distress.  HENT:  Head: Normocephalic.  Eyes: Pupils are equal, round, and reactive to light. Conjunctivae and EOM are normal.  Neck: Normal range of motion. Neck supple.  Cardiovascular: Normal rate, regular rhythm, normal heart sounds and intact distal pulses.  Respiratory: Effort normal and breath sounds normal. No respiratory distress. She has no wheezes.  GI: Soft. Bowel sounds are normal. She exhibits no distension. There is no abdominal tenderness.  Musculoskeletal:     Right hip: She exhibits decreased range of motion, decreased strength, tenderness and bony tenderness.  Lymphadenopathy:    She has no cervical adenopathy.  Neurological: She is alert and oriented to person, place, and time.  Skin: Skin is warm and dry. No rash noted. No erythema.  Psychiatric: She has a normal mood and affect. Her behavior is normal.    Vital signs in last 24 hours: @VSRANGES@  Labs:   Estimated body mass index is 28.7 kg/m as calculated from the following:   Height as of 03/28/18: 5' 3" (1.6 m).   Weight as of 03/28/18: 73.5 kg.   Imaging Review Plain radiographs demonstrate severe degenerative joint disease of the  right hip(s). The bone quality appears to be good for age and reported activity level.      Assessment/Plan:  End stage arthritis, right hip(s)  The patient history, physical examination, clinical judgement of the provider and imaging studies are consistent with end stage degenerative joint disease of the right hip(s) and total hip arthroplasty is deemed medically necessary. The treatment options including medical management, injection therapy, arthroscopy and arthroplasty were discussed at length. The risks and benefits of total hip arthroplasty were presented and reviewed. The risks due to aseptic loosening, infection, stiffness, dislocation/subluxation,  thromboembolic complications and other imponderables were discussed.  The patient acknowledged the explanation, agreed to proceed with the plan and consent was signed. Patient is being admitted for inpatient treatment for surgery, pain control, PT, OT, prophylactic antibiotics, VTE prophylaxis, progressive ambulation and ADL's and discharge planning.The patient is planning to be discharged home with home health services    

## 2018-08-12 NOTE — Patient Instructions (Signed)
April DartingCynthia C Shaffer    Your procedure is scheduled on: 08-22-2018  [facility]  Report to admitting at 740 AM   YOU NEED TO HAVE A COVID 19 TEST ON_______ @_______ , THIS TEST MUST BE DONE BEFORE SURGERY, [facility]. ONCE YOUR COVID TEST IS COMPLETED, PLEASE BEGIN THE QUARANTINE INSTRUCTIONS AS OUTLINED IN YOUR HANDOUT.   Call this number if you have problems the morning of surgery [phone]    Remember:  BRUSH YOUR TEETH MORNING OF SURGERY AND RINSE YOUR MOUTH OUT, NO CHEWING GUM CANDY OR MINTS.   NO SOLID FOOD AFTER MIDNIGHT THE NIGHT PRIOR TO SURGERY. NOTHING BY MOUTH EXCEPT CLEAR LIQUIDS UNTIL 430 AM.  PLEASE FINISH G 2 DRINK PER SURGEON ORDER  WHICH NEEDS TO BE COMPLETED AT 430 AM.     CLEAR LIQUID DIET   Foods Allowed                                                                     Foods Excluded  Coffee and tea, regular and decaf                             liquids that you cannot  Plain Jell-O in any flavor                                             see through such as: Fruit ices (not with fruit pulp)                                     milk, soups, orange juice  Iced Popsicles                                    All solid food Carbonated beverages, regular and diet                                    Cranberry, grape and apple juices Sports drinks like Gatorade Lightly seasoned clear broth or consume(fat free) Sugar, honey syrup  Sample Menu Breakfast                                Lunch                                     Supper Cranberry juice                    Beef broth  Chicken broth Jell-O                                     Grape juice                           Apple juice Coffee or tea                        Jell-O                                      Popsicle                                                Coffee or tea                         Coffee or tea  _____________________________________________________________________    Take these medicines the morning of surgery with A SIP OF WATER: GABAPENTIN (NEURONTIN), ZETIA, CARVEDILOL (COREG CR), AMLODIPINE (NORVASC), ALLOPURINOL  DO NOT TAKE ANY DIABETIC MEDICATIONS DAY OF YOUR SURGERY                      How to Manage Your Diabetes Before and After Surgery  Why is it important to control my blood sugar before and after surgery? . Improving blood sugar levels before and after surgery helps healing and can limit problems. . A way of improving blood sugar control is eating a healthy diet by: o  Eating less sugar and carbohydrates o  Increasing activity/exercise o  Talking with your doctor about reaching your blood sugar goals . High blood sugars (greater than 180 mg/dL) can raise your risk of infections and slow your recovery, so you will need to focus on controlling your diabetes during the weeks before surgery. . Make sure that the doctor who takes care of your diabetes knows about your planned surgery including the date and location.  How do I manage my blood sugar before surgery? . Check your blood sugar at least 4 times a day, starting 2 days before surgery, to make sure that the level is not too high or low. o Check your blood sugar the morning of your surgery when you wake up and every 2 hours until you get to the Short Stay unit. . If your blood sugar is less than 70 mg/dL, you will need to treat for low blood sugar: o Do not take insulin. o Treat a low blood sugar (less than 70 mg/dL) with  cup of clear juice (cranberry or apple), 4 glucose tablets, OR glucose gel. o Recheck blood sugar in 15 minutes after treatment (to make sure it is greater than 70 mg/dL). If your blood sugar is not greater than 70 mg/dL on recheck, call [phone] for further instructions. . Report your blood sugar to the short stay nurse when you get to Short Stay.  . If you are admitted to  the hospital after surgery: o Your blood sugar will be checked by the staff and you will probably be given insulin after surgery (instead of oral diabetes medicines) to  make sure you have good blood sugar levels. o The goal for blood sugar control after surgery is 80-180 mg/dL.   WHAT DO I DO ABOUT MY DIABETES MEDICATION?  Marland Kitchen. Do not take oral diabetes medicines (pills) the morning of surgery.  . THE DAY  BEFORE SURGERY TAKE METFORMIN AS USUAL.     THE MORNING OF SURGERY DO NOT TAKE METFORMIN.  Reviewed and Endorsed by [facility] Patient Education Committee, August 2015           You may not have any metal on your body including hair pins and              piercings  Do not wear jewelry, make-up, lotions, powders or perfumes, deodorant             Do not wear nail polish.  Do not shave  48 hours prior to surgery.             Do not bring valuables to the hospital. [facility] IS NOT             RESPONSIBLE   FOR VALUABLES.  Contacts, dentures or bridgework may not be worn into surgery.  Leave suitcase in the car. After surgery it may be brought to your room.     _____________________________________________________________________             [facility] - Preparing for Surgery Before surgery, you can play an important role.  Because skin is not sterile, your skin needs to be as free of germs as possible.  You can reduce the number of germs on your skin by washing with CHG (chlorahexidine gluconate) soap before surgery.  CHG is an antiseptic cleaner which kills germs and bonds with the skin to continue killing germs even after washing. Please DO NOT use if you have an allergy to CHG or antibacterial soaps.  If your skin becomes reddened/irritated stop using the CHG and inform your nurse when you arrive at Short Stay. Do not shave (including legs and underarms) for at least 48 hours prior to the first CHG shower.  You may shave your face/neck. Please follow these instructions  carefully:  1.  Shower with CHG Soap the night before surgery and the  morning of Surgery.  2.  If you choose to wash your hair, wash your hair first as usual with your  normal  shampoo.  3.  After you shampoo, rinse your hair and body thoroughly to remove the  shampoo.                           4.  Use CHG as you would any other liquid soap.  You can apply chg directly  to the skin and wash                       Gently with a scrungie or clean washcloth.  5.  Apply the CHG Soap to your body ONLY FROM THE NECK DOWN.   Do not use on face/ open                           Wound or open sores. Avoid contact with eyes, ears mouth and genitals (private parts).                       Wash face,  Genitals (private parts) with your normal  soap.             6.  Wash thoroughly, paying special attention to the area where your surgery  will be performed.  7.  Thoroughly rinse your body with warm water from the neck down.  8.  DO NOT shower/wash with your normal soap after using and rinsing off  the CHG Soap.                9.  Pat yourself dry with a clean towel.            10.  Wear clean pajamas.            11.  Place clean sheets on your bed the night of your first shower and do not  sleep with pets. Day of Surgery : Do not apply any lotions/deodorants the morning of surgery.  Please wear clean clothes to the hospital/surgery center.  FAILURE TO FOLLOW THESE INSTRUCTIONS MAY RESULT IN THE CANCELLATION OF YOUR SURGERY PATIENT SIGNATURE_________________________________  NURSE SIGNATURE__________________________________  ________________________________________________________________________   Adam Phenix  An incentive spirometer is a tool that can help keep your lungs clear and active. This tool measures how well you are filling your lungs with each breath. Taking long deep breaths may help reverse or decrease the chance of developing breathing (pulmonary) problems (especially infection)  following:  A long period of time when you are unable to move or be active. BEFORE THE PROCEDURE   If the spirometer includes an indicator to show your best effort, your nurse or respiratory therapist will set it to a desired goal.  If possible, sit up straight or lean slightly forward. Try not to slouch.  Hold the incentive spirometer in an upright position. INSTRUCTIONS FOR USE  1. Sit on the edge of your bed if possible, or sit up as far as you can in bed or on a chair. 2. Hold the incentive spirometer in an upright position. 3. Breathe out normally. 4. Place the mouthpiece in your mouth and seal your lips tightly around it. 5. Breathe in slowly and as deeply as possible, raising the piston or the ball toward the top of the column. 6. Hold your breath for 3-5 seconds or for as long as possible. Allow the piston or ball to fall to the bottom of the column. 7. Remove the mouthpiece from your mouth and breathe out normally. 8. Rest for a few seconds and repeat Steps 1 through 7 at least 10 times every 1-2 hours when you are awake. Take your time and take a few normal breaths between deep breaths. 9. The spirometer may include an indicator to show your best effort. Use the indicator as a goal to work toward during each repetition. 10. After each set of 10 deep breaths, practice coughing to be sure your lungs are clear. If you have an incision (the cut made at the time of surgery), support your incision when coughing by placing a pillow or rolled up towels firmly against it. Once you are able to get out of bed, walk around indoors and cough well. You may stop using the incentive spirometer when instructed by your caregiver.  RISKS AND COMPLICATIONS  Take your time so you do not get dizzy or light-headed.  If you are in pain, you may need to take or ask for pain medication before doing incentive spirometry. It is harder to take a deep breath if you are having pain. AFTER USE  Rest and  breathe slowly and  easily.  It can be helpful to keep track of a log of your progress. Your caregiver can provide you with a simple table to help with this. If you are using the spirometer at home, follow these instructions: SEEK MEDICAL CARE IF:   You are having difficultly using the spirometer.  You have trouble using the spirometer as often as instructed.  Your pain medication is not giving enough relief while using the spirometer.  You develop fever of 100.5 F (38.1 C) or higher. SEEK IMMEDIATE MEDICAL CARE IF:   You cough up bloody sputum that had not been present before.  You develop fever of 102 F (38.9 C) or greater.  You develop worsening pain at or near the incision site. MAKE SURE YOU:   Understand these instructions.  Will watch your condition.  Will get help right away if you are not doing well or get worse. Document Released: 06/25/2006 Document Revised: 05/07/2011 Document Reviewed: 08/26/2006 Exit[employer] Patient Information 2014 ExitCare, [place].   ________________________________________________________________________  WHAT IS A BLOOD TRANSFUSION? Blood Transfusion Information  A transfusion is the replacement of blood or some of its parts. Blood is made up of multiple cells which provide different functions.  Red blood cells carry oxygen and are used for blood loss replacement.  White blood cells fight against infection.  Platelets control bleeding.  Plasma helps clot blood.  Other blood products are available for specialized needs, such as hemophilia or other clotting disorders. BEFORE THE TRANSFUSION  Who gives blood for transfusions?   Healthy volunteers who are fully evaluated to make sure their blood is safe. This is blood bank blood. Transfusion therapy is the safest it has ever been in the practice of medicine. Before blood is taken from a donor, a complete history is taken to make sure that person has no history of diseases nor engages in  risky social behavior (examples are intravenous drug use or sexual activity with multiple partners). The donor's travel history is screened to minimize risk of transmitting infections, such as malaria. The donated blood is tested for signs of infectious diseases, such as HIV and hepatitis. The blood is then tested to be sure it is compatible with you in order to minimize the chance of a transfusion reaction. If you or a relative donates blood, this is often done in anticipation of surgery and is not appropriate for emergency situations. It takes many days to process the donated blood. RISKS AND COMPLICATIONS Although transfusion therapy is very safe and saves many lives, the main dangers of transfusion include:   Getting an infectious disease.  Developing a transfusion reaction. This is an allergic reaction to something in the blood you were given. Every precaution is taken to prevent this. The decision to have a blood transfusion has been considered carefully by your caregiver before blood is given. Blood is not given unless the benefits outweigh the risks. AFTER THE TRANSFUSION  Right after receiving a blood transfusion, you will usually feel much better and more energetic. This is especially true if your red blood cells have gotten low (anemic). The transfusion raises the level of the red blood cells which carry oxygen, and this usually causes an energy increase.  The nurse administering the transfusion will monitor you carefully for complications. HOME CARE INSTRUCTIONS  No special instructions are needed after a transfusion. You may find your energy is better. Speak with your caregiver about any limitations on activity for underlying diseases you may have. SEEK MEDICAL CARE IF:  Your condition is not improving after your transfusion.  You develop redness or irritation at the intravenous (IV) site. SEEK IMMEDIATE MEDICAL CARE IF:  Any of the following symptoms occur over the next 12  hours:  Shaking chills.  You have a temperature by mouth above 102 F (38.9 C), not controlled by medicine.  Chest, back, or muscle pain.  People around you feel you are not acting correctly or are confused.  Shortness of breath or difficulty breathing.  Dizziness and fainting.  You get a rash or develop hives.  You have a decrease in urine output.  Your urine turns a dark color or changes to pink, red, or brown. Any of the following symptoms occur over the next 10 days:  You have a temperature by mouth above 102 F (38.9 C), not controlled by medicine.  Shortness of breath.  Weakness after normal activity.  The white part of the eye turns yellow (jaundice).  You have a decrease in the amount of urine or are urinating less often.  Your urine turns a dark color or changes to pink, red, or brown. Document Released: 02/10/2000 Document Revised: 05/07/2011 Document Reviewed: 09/29/2007 [facility] Patient Information 2014 [place], [place].  _______________________________________________________________________

## 2018-08-12 NOTE — Progress Notes (Signed)
CARDIAC CLEARANCE NOTE HAO MENG PA WITH NOTE TO STOP ASPIRIN AND PLAVIX 5 DAYS PRIOR TO SURGERY 04-08-18 Epic STRESS TEST 03-28-18 Epic  EKG 03-26-18 Epic  HEMAGLOBIN A1C 08-06-18 Epic BILATERAL CAROTID DUPLEX 03-13-18 EPIC

## 2018-08-13 ENCOUNTER — Encounter (HOSPITAL_COMMUNITY)
Admission: RE | Admit: 2018-08-13 | Discharge: 2018-08-13 | Disposition: A | Discharge status: Home / Self Care | Payer: Medicare HMO | Source: Ambulatory Visit | Attending: Orthopedic Surgery | Admitting: Orthopedic Surgery

## 2018-08-13 ENCOUNTER — Other Ambulatory Visit (INDEPENDENT_AMBULATORY_CARE_PROVIDER_SITE_OTHER): Payer: Medicare HMO

## 2018-08-13 ENCOUNTER — Other Ambulatory Visit: Payer: Self-pay

## 2018-08-13 ENCOUNTER — Encounter (HOSPITAL_COMMUNITY): Payer: Self-pay

## 2018-08-13 DIAGNOSIS — Z1211 Encounter for screening for malignant neoplasm of colon: Secondary | ICD-10-CM

## 2018-08-13 DIAGNOSIS — Z01812 Encounter for preprocedural laboratory examination: Secondary | ICD-10-CM | POA: Insufficient documentation

## 2018-08-13 DIAGNOSIS — M1611 Unilateral primary osteoarthritis, right hip: Secondary | ICD-10-CM | POA: Insufficient documentation

## 2018-08-13 LAB — URINALYSIS, COMPLETE (UACMP) WITH MICROSCOPIC
Bacteria, UA: NONE SEEN
Bilirubin Urine: NEGATIVE
Glucose, UA: NEGATIVE mg/dL
Hgb urine dipstick: NEGATIVE
Ketones, ur: NEGATIVE mg/dL
Leukocytes,Ua: NEGATIVE
Nitrite: NEGATIVE
Protein, ur: 100 mg/dL — AB
Specific Gravity, Urine: 1.017 (ref 1.005–1.030)
pH: 6 (ref 5.0–8.0)

## 2018-08-13 LAB — CBC WITH DIFFERENTIAL/PLATELET
Abs Immature Granulocytes: 0.01 10*3/uL (ref 0.00–0.07)
Basophils Absolute: 0.1 10*3/uL (ref 0.0–0.1)
Basophils Relative: 1 %
Eosinophils Absolute: 0.2 10*3/uL (ref 0.0–0.5)
Eosinophils Relative: 3 %
HCT: 38.8 % (ref 36.0–46.0)
Hemoglobin: 12.8 g/dL (ref 12.0–15.0)
Immature Granulocytes: 0 %
Lymphocytes Relative: 41 %
Lymphs Abs: 2.6 10*3/uL (ref 0.7–4.0)
MCH: 31.3 pg (ref 26.0–34.0)
MCHC: 33 g/dL (ref 30.0–36.0)
MCV: 94.9 fL (ref 80.0–100.0)
Monocytes Absolute: 0.7 10*3/uL (ref 0.1–1.0)
Monocytes Relative: 11 %
Neutro Abs: 2.9 10*3/uL (ref 1.7–7.7)
Neutrophils Relative %: 44 %
Platelets: 187 10*3/uL (ref 150–400)
RBC: 4.09 MIL/uL (ref 3.87–5.11)
RDW: 14.1 % (ref 11.5–15.5)
WBC: 6.4 10*3/uL (ref 4.0–10.5)
nRBC: 0 % (ref 0.0–0.2)

## 2018-08-13 LAB — POC HEMOCCULT BLD/STL (HOME/3-CARD/SCREEN)
Card #2 Fecal Occult Blod, POC: NEGATIVE
Card #3 Fecal Occult Blood, POC: NEGATIVE
Fecal Occult Blood, POC: NEGATIVE

## 2018-08-13 LAB — COMPREHENSIVE METABOLIC PANEL
ALT: 32 U/L (ref 0–44)
AST: 39 U/L (ref 15–41)
Albumin: 4.1 g/dL (ref 3.5–5.0)
Alkaline Phosphatase: 90 U/L (ref 38–126)
Anion gap: 9 (ref 5–15)
BUN: 13 mg/dL (ref 8–23)
CO2: 26 mmol/L (ref 22–32)
Calcium: 8.9 mg/dL (ref 8.9–10.3)
Chloride: 105 mmol/L (ref 98–111)
Creatinine, Ser: 0.76 mg/dL (ref 0.44–1.00)
GFR calc Af Amer: >60 mL/min (ref >60)
GFR calc non Af Amer: >60 mL/min (ref >60)
Glucose, Bld: 96 mg/dL (ref 70–99)
Potassium: 3.5 mmol/L (ref 3.5–5.1)
Sodium: 140 mmol/L (ref 135–145)
Total Bilirubin: 1.1 mg/dL (ref 0.3–1.2)
Total Protein: 7.7 g/dL (ref 6.5–8.1)

## 2018-08-13 LAB — SURGICAL PCR SCREEN
MRSA, PCR: NEGATIVE
Staphylococcus aureus: NEGATIVE

## 2018-08-13 LAB — GLUCOSE, CAPILLARY: Glucose-Capillary: 102 mg/dL — ABNORMAL HIGH (ref 70–99)

## 2018-08-14 LAB — URINE CULTURE: Culture: NO GROWTH

## 2018-08-15 NOTE — Progress Notes (Signed)
Anesthesia Chart Review   Case: [id number] Date/Time: 08/22/18 0958   Procedure: TOTAL HIP ARTHROPLASTY (Right )   Anesthesia type: Choice   Pre-op diagnosis: OA RIGHT HIP   Location: [facility]   Surgeon: Earlie Server, MD      DISCUSSION: 68 yo light tobacco smoker (2.5 pack years) with h/o DM II, HLD, HTN, CAD, carotid artery stenosis, sleep apnea, right hip OA scheduled for above procedure 08/22/2018 with Dr. Earlie Server.   Cardiac clearance received 04/08/2018.  Per [employer], PA-C, "Given past medical history and time since last visit, based on ACC/AHA guidelines, April Shaffer would be at acceptable risk for the planned procedure without further cardiovascular testing.  I will route this recommendation to the requesting party via Epic fax function and remove from pre-op pool.  Please call with questions. I personally discussed with Dr. Gwenlyn Found, ok to hold aspirin and Plavix for 5 days prior to procedure and restart as soon as possible after the procedure at the surgeon's discretion based on bleeding risk."  Pt can proceed with planned procedure barring acute status change.  VS: There were no vitals taken for this visit.  PROVIDERSMack Hook, MD last seen 08/06/2018  Quay Burow, MD is Cardiologist  LABS: Labs reviewed: Acceptable for surgery. (all labs ordered are listed, but only abnormal results are displayed)  Labs Reviewed  URINALYSIS, COMPLETE (UACMP) WITH MICROSCOPIC - Abnormal; Notable for the following components:      Result Value   Protein, ur 100 (*)    All other components within normal limits  GLUCOSE, CAPILLARY - Abnormal; Notable for the following components:   Glucose-Capillary 102 (*)    All other components within normal limits  URINE CULTURE  SURGICAL PCR SCREEN  CBC WITH DIFFERENTIAL/PLATELET  COMPREHENSIVE METABOLIC PANEL  TYPE AND SCREEN     IMAGES: Carotid US 03/13/2018 Summary: Right Carotid: Evidence consistent with a  total occlusion of the right ICA.  Left Carotid: Velocities in the left ICA are consistent with a 60-79% stenosis.               Non-hemodynamically significant plaque noted in the CCA. The ECA               appears >50% stenosed.  Vertebrals:  Bilateral vertebral arteries demonstrate antegrade flow. Subclavians: Normal flow hemodynamics were seen in bilateral subclavian              arteries.  EKG: 03/26/2018 Rate 76 bpm Normal sinus rhythm  Nonspecific T wave abnormality Prolonged QT  CV: Myocardial Perfusion 03/28/2018   Nuclear stress EF: 68%.  The left ventricular ejection fraction is hyperdynamic (>65%).  There was no ST segment deviation noted during stress.  The study is normal.  This is a low risk study.   Normal pharmacologic nuclear study with no evidence for a prior infarct or ischemia. Since the prior study in 2013 ischemia is no longer present. Past Medical History:  Diagnosis Date  . Arthritis   . Carotid artery narrowing   . Coronary artery disease    Cardiac catheterization November 2013: 50% ostial LAD stenosis 50% mid stenosis. 30% disease in the left circumflex.  . Diabetes mellitus, type 2 ([place])   . Hyperlipidemia   . Hypertension   . Onychomycosis of toenail 07/31/2016  . Sleep apnea       Had surgery to correct    Past Surgical History:  Procedure Laterality Date  . ABDOMINAL HYSTERECTOMY    .  CARDIAC CATHETERIZATION    . ENDARTERECTOMY  09/27/2011   Procedure: RIGT ENDARTERECTOMY CAROTID;  Surgeon: Mal Misty, MD;  Location: [facility];  Service: Vascular;  Laterality: Right;  . EPIDURAL BLOCK INJECTION  02/2008   Drs. Eulis Manly  . gyn surgery  2004   total hysterectomy for mennorhagia,,salpingoophorectomy  . TONSILLECTOMY    . TOTAL ABDOMINAL HYSTERECTOMY W/ BILATERAL SALPINGOOPHORECTOMY Bilateral 2000  . VARICOSE VEIN SURGERY  2008   stripping    MEDICATIONS: . OVER THE COUNTER MEDICATION  . acetaminophen (TYLENOL) 650 MG  CR tablet  . allopurinol (ZYLOPRIM) 300 MG tablet  . amLODipine (NORVASC) 10 MG tablet  . aspirin EC 81 MG tablet  . atorvastatin (LIPITOR) 80 MG tablet  . blood glucose meter kit and supplies KIT  . carvedilol (COREG CR) 20 MG 24 hr capsule  . clopidogrel (PLAVIX) 75 MG tablet  . COLCRYS 0.6 MG tablet  . cyclobenzaprine (FLEXERIL) 5 MG tablet  . ezetimibe (ZETIA) 10 MG tablet  . fexofenadine (ALLEGRA) 180 MG tablet  . gabapentin (NEURONTIN) 300 MG capsule  . glucose blood test strip  . Liniments (SALONPAS PAIN RELIEF PATCH EX)  . metFORMIN (GLUCOPHAGE-XR) 500 MG 24 hr tablet  . omega-3 acid ethyl esters (LOVAZA) 1 g capsule   No current facility-administered medications for this encounter.     Maia Plan [facility] Pre-Surgical Testing [phone] 08/15/18 2:57 PM

## 2018-08-15 NOTE — Anesthesia Preprocedure Evaluation (Addendum)
Anesthesia Evaluation  Patient identified by MRN, date of birth, ID band Patient awake    Reviewed: Allergy & Precautions, NPO status , Patient's Chart, lab work & pertinent test results  Airway Mallampati: I  TM Distance: >3 FB Neck ROM: Full    Dental no notable dental hx. (+) Teeth Intact, Dental Advisory Given   Pulmonary sleep apnea , Current Smoker,    Pulmonary exam normal breath sounds clear to auscultation       Cardiovascular hypertension, + CAD and + Peripheral Vascular Disease (carotid stenosis s/p CEA 2013)  Normal cardiovascular exam Rhythm:Regular Rate:Normal  Stress Test 2020 Nuclear stress EF: 68%. The left ventricular ejection fraction is hyperdynamic (>65%). There was no ST segment deviation noted during stress. The study is normal. This is a low risk study. Normal pharmacologic nuclear study with no evidence for a prior infarct or ischemia. Since the prior study in 2013 ischemia is no longer present.  Carotid Doppler 2020 Right Carotid: Evidence consistent with a total occlusion of the right ICA.  Left Carotid: Velocities in the left ICA are consistent with a 60-79% stenosis. Non-hemodynamically significant plaque noted in the CCA. The ECA appears >50% stenosed.  Vertebrals:  Bilateral vertebral arteries demonstrate antegrade flow. Subclavians: Normal flow hemodynamics were seen in bilateral subclavian arteries.   Neuro/Psych negative neurological ROS  negative psych ROS   GI/Hepatic negative GI ROS, Neg liver ROS,   Endo/Other  negative endocrine ROSdiabetes, Type 2  Renal/GU negative Renal ROS  negative genitourinary   Musculoskeletal  (+) Arthritis , Osteoarthritis,    Abdominal   Peds  Hematology  (+) Blood dyscrasia (on asa and plavix, last dose 6/21 at 9am), ,   Anesthesia Other Findings   Reproductive/Obstetrics                           Anesthesia  Physical Anesthesia Plan  ASA: III  Anesthesia Plan: Spinal   Post-op Pain Management:    Induction: Intravenous  PONV Risk Score and Plan: 1 and Ondansetron, Dexamethasone and Midazolam  Airway Management Planned: Natural Airway and Simple Face Mask  Additional Equipment:   Intra-op Plan:   Post-operative Plan:   Informed Consent: I have reviewed the patients History and Physical, chart, labs and discussed the procedure including the risks, benefits and alternatives for the proposed anesthesia with the patient or authorized representative who has indicated his/her understanding and acceptance.     Dental advisory given  Plan Discussed with: CRNA  Anesthesia Plan Comments:       Anesthesia Quick Evaluation

## 2018-08-19 ENCOUNTER — Other Ambulatory Visit (HOSPITAL_COMMUNITY)
Admission: RE | Admit: 2018-08-19 | Discharge: 2018-08-19 | Disposition: A | Discharge status: Home / Self Care | Payer: Medicare HMO | Source: Ambulatory Visit | Attending: Orthopedic Surgery | Admitting: Orthopedic Surgery

## 2018-08-19 DIAGNOSIS — Z1159 Encounter for screening for other viral diseases: Secondary | ICD-10-CM | POA: Insufficient documentation

## 2018-08-19 LAB — SARS CORONAVIRUS 2 (TAT 6-24 HRS): SARS Coronavirus 2: NEGATIVE

## 2018-08-21 MED ORDER — BUPIVACAINE LIPOSOME 1.3 % IJ SUSP
10.0000 mL | Freq: Once | INTRAMUSCULAR | Status: DC
  Filled 2018-08-21: qty 10

## 2018-08-21 MED ORDER — TRANEXAMIC ACID 1000 MG/10ML IV SOLN
2000.0000 mg | INTRAVENOUS | Status: DC
  Filled 2018-08-21: qty 20

## 2018-08-22 ENCOUNTER — Encounter (HOSPITAL_COMMUNITY)
Admission: AD | Disposition: A | Discharge status: Home Health | Payer: Self-pay | Source: Home / Self Care | Attending: Orthopedic Surgery

## 2018-08-22 ENCOUNTER — Observation Stay (HOSPITAL_COMMUNITY): Payer: Medicare HMO

## 2018-08-22 ENCOUNTER — Ambulatory Visit (HOSPITAL_COMMUNITY): Payer: Medicare HMO | Admitting: Anesthesiology

## 2018-08-22 ENCOUNTER — Ambulatory Visit (HOSPITAL_COMMUNITY): Payer: Medicare HMO | Admitting: Physician Assistant

## 2018-08-22 ENCOUNTER — Inpatient Hospital Stay (HOSPITAL_COMMUNITY)
Admission: AD | Admit: 2018-08-22 | Discharge: 2018-08-23 | DRG: 470 | Disposition: A | Discharge status: Home Health | Payer: Medicare HMO | Attending: Orthopedic Surgery | Admitting: Orthopedic Surgery

## 2018-08-22 ENCOUNTER — Other Ambulatory Visit: Payer: Self-pay

## 2018-08-22 ENCOUNTER — Encounter (HOSPITAL_COMMUNITY): Payer: Self-pay | Admitting: *Deleted

## 2018-08-22 DIAGNOSIS — Z9079 Acquired absence of other genital organ(s): Secondary | ICD-10-CM

## 2018-08-22 DIAGNOSIS — Z20828 Contact with and (suspected) exposure to other viral communicable diseases: Secondary | ICD-10-CM | POA: Diagnosis present

## 2018-08-22 DIAGNOSIS — E785 Hyperlipidemia, unspecified: Secondary | ICD-10-CM | POA: Diagnosis present

## 2018-08-22 DIAGNOSIS — G4730 Sleep apnea, unspecified: Secondary | ICD-10-CM | POA: Diagnosis present

## 2018-08-22 DIAGNOSIS — Z79899 Other long term (current) drug therapy: Secondary | ICD-10-CM

## 2018-08-22 DIAGNOSIS — Z8249 Family history of ischemic heart disease and other diseases of the circulatory system: Secondary | ICD-10-CM

## 2018-08-22 DIAGNOSIS — Z888 Allergy status to other drugs, medicaments and biological substances status: Secondary | ICD-10-CM

## 2018-08-22 DIAGNOSIS — Z833 Family history of diabetes mellitus: Secondary | ICD-10-CM

## 2018-08-22 DIAGNOSIS — Z90710 Acquired absence of both cervix and uterus: Secondary | ICD-10-CM

## 2018-08-22 DIAGNOSIS — I2510 Atherosclerotic heart disease of native coronary artery without angina pectoris: Secondary | ICD-10-CM | POA: Diagnosis present

## 2018-08-22 DIAGNOSIS — M109 Gout, unspecified: Secondary | ICD-10-CM | POA: Diagnosis present

## 2018-08-22 DIAGNOSIS — I10 Essential (primary) hypertension: Secondary | ICD-10-CM

## 2018-08-22 DIAGNOSIS — Z7902 Long term (current) use of antithrombotics/antiplatelets: Secondary | ICD-10-CM

## 2018-08-22 DIAGNOSIS — M1611 Unilateral primary osteoarthritis, right hip: Principal | ICD-10-CM | POA: Diagnosis present

## 2018-08-22 DIAGNOSIS — Z90722 Acquired absence of ovaries, bilateral: Secondary | ICD-10-CM

## 2018-08-22 DIAGNOSIS — E1169 Type 2 diabetes mellitus with other specified complication: Secondary | ICD-10-CM

## 2018-08-22 DIAGNOSIS — E1151 Type 2 diabetes mellitus with diabetic peripheral angiopathy without gangrene: Secondary | ICD-10-CM | POA: Diagnosis present

## 2018-08-22 DIAGNOSIS — Z7984 Long term (current) use of oral hypoglycemic drugs: Secondary | ICD-10-CM

## 2018-08-22 DIAGNOSIS — E1142 Type 2 diabetes mellitus with diabetic polyneuropathy: Secondary | ICD-10-CM | POA: Diagnosis present

## 2018-08-22 DIAGNOSIS — F17210 Nicotine dependence, cigarettes, uncomplicated: Secondary | ICD-10-CM | POA: Diagnosis present

## 2018-08-22 DIAGNOSIS — Z885 Allergy status to narcotic agent status: Secondary | ICD-10-CM

## 2018-08-22 DIAGNOSIS — Z7982 Long term (current) use of aspirin: Secondary | ICD-10-CM

## 2018-08-22 HISTORY — PX: TOTAL HIP ARTHROPLASTY: SHX124

## 2018-08-22 LAB — GLUCOSE, CAPILLARY
Glucose-Capillary: 145 mg/dL — ABNORMAL HIGH (ref 70–99)
Glucose-Capillary: 177 mg/dL — ABNORMAL HIGH (ref 70–99)
Glucose-Capillary: 176 mg/dL — ABNORMAL HIGH (ref 70–99)
Glucose-Capillary: 139 mg/dL — ABNORMAL HIGH (ref 70–99)

## 2018-08-22 LAB — TYPE AND SCREEN
ABO/RH(D): O POS
Antibody Screen: NEGATIVE

## 2018-08-22 SURGERY — ARTHROPLASTY, HIP, TOTAL,POSTERIOR APPROACH
Anesthesia: Spinal | Site: Hip | Laterality: Right

## 2018-08-22 MED ORDER — OXYCODONE HCL 5 MG PO TABS: ORAL_TABLET | ORAL | 0 refills | Status: DC

## 2018-08-22 MED ORDER — DEXMEDETOMIDINE HCL IN NACL 200 MCG/50ML IV SOLN
INTRAVENOUS | Status: DC | PRN
  Administered 2018-08-22: 20 ug via INTRAVENOUS

## 2018-08-22 MED ORDER — CEFAZOLIN SODIUM-DEXTROSE 1-4 GM/50ML-% IV SOLN
1.0000 g | Freq: Four times a day (QID) | INTRAVENOUS | Status: AC
  Administered 2018-08-22 (×2): 1 g via INTRAVENOUS
  Filled 2018-08-22 (×2): qty 50

## 2018-08-22 MED ORDER — EPHEDRINE SULFATE-NACL 50-0.9 MG/10ML-% IV SOSY
PREFILLED_SYRINGE | INTRAVENOUS | Status: DC | PRN
  Administered 2018-08-22 (×2): 10 mg via INTRAVENOUS
  Administered 2018-08-22: 5 mg via INTRAVENOUS

## 2018-08-22 MED ORDER — EZETIMIBE 10 MG PO TABS
10.0000 mg | ORAL_TABLET | Freq: Every day | ORAL | Status: DC
  Administered 2018-08-22 – 2018-08-23 (×2): 10 mg via ORAL
  Filled 2018-08-22 (×2): qty 1

## 2018-08-22 MED ORDER — OXYCODONE HCL 5 MG PO TABS
5.0000 mg | ORAL_TABLET | ORAL | Status: DC | PRN
  Administered 2018-08-22 – 2018-08-23 (×5): 5 mg via ORAL
  Filled 2018-08-22 (×5): qty 1

## 2018-08-22 MED ORDER — ACETAMINOPHEN 500 MG PO TABS
1000.0000 mg | ORAL_TABLET | Freq: Once | ORAL | Status: AC
  Administered 2018-08-22: 1000 mg via ORAL
  Filled 2018-08-22: qty 2

## 2018-08-22 MED ORDER — TRANEXAMIC ACID-NACL 1000-0.7 MG/100ML-% IV SOLN
1000.0000 mg | INTRAVENOUS | Status: AC
  Administered 2018-08-22: 11:00:00 1000 mg via INTRAVENOUS
  Filled 2018-08-22: qty 100

## 2018-08-22 MED ORDER — ATORVASTATIN CALCIUM 40 MG PO TABS
80.0000 mg | ORAL_TABLET | Freq: Every day | ORAL | Status: DC
  Administered 2018-08-22: 80 mg via ORAL
  Filled 2018-08-22: qty 2

## 2018-08-22 MED ORDER — LACTATED RINGERS IV SOLN
INTRAVENOUS | Status: DC
  Administered 2018-08-22 (×2): via INTRAVENOUS

## 2018-08-22 MED ORDER — ONDANSETRON HCL 4 MG PO TABS: 4.0000 mg | ORAL_TABLET | Freq: Four times a day (QID) | ORAL | Status: DC | PRN

## 2018-08-22 MED ORDER — PROPOFOL 500 MG/50ML IV EMUL
INTRAVENOUS | Status: DC | PRN
  Administered 2018-08-22: 75 ug/kg/min via INTRAVENOUS

## 2018-08-22 MED ORDER — CEFAZOLIN SODIUM-DEXTROSE 2-4 GM/100ML-% IV SOLN
2.0000 g | INTRAVENOUS | Status: AC
  Administered 2018-08-22: 10:00:00 2 g via INTRAVENOUS
  Filled 2018-08-22: qty 100

## 2018-08-22 MED ORDER — BUPIVACAINE LIPOSOME 1.3 % IJ SUSP
INTRAMUSCULAR | Status: DC | PRN
  Administered 2018-08-22: 10 mL

## 2018-08-22 MED ORDER — CARVEDILOL PHOSPHATE ER 20 MG PO CP24
20.0000 mg | ORAL_CAPSULE | Freq: Every day | ORAL | Status: DC
  Administered 2018-08-23: 20 mg via ORAL
  Filled 2018-08-22: qty 1

## 2018-08-22 MED ORDER — INSULIN ASPART 100 UNIT/ML ~~LOC~~ SOLN
0.0000 [IU] | Freq: Three times a day (TID) | SUBCUTANEOUS | Status: DC
  Administered 2018-08-22 – 2018-08-23 (×2): 3 [IU] via SUBCUTANEOUS
  Administered 2018-08-23: 2 [IU] via SUBCUTANEOUS

## 2018-08-22 MED ORDER — ONDANSETRON HCL 4 MG/2ML IJ SOLN
INTRAMUSCULAR | Status: AC
  Filled 2018-08-22: qty 2

## 2018-08-22 MED ORDER — DEXMEDETOMIDINE HCL IN NACL 200 MCG/50ML IV SOLN
INTRAVENOUS | Status: AC
  Filled 2018-08-22: qty 50

## 2018-08-22 MED ORDER — FENTANYL CITRATE (PF) 100 MCG/2ML IJ SOLN
INTRAMUSCULAR | Status: DC | PRN
  Administered 2018-08-22 (×2): 25 ug via INTRAVENOUS
  Administered 2018-08-22: 25 ug via INTRATHECAL
  Administered 2018-08-22: 25 ug via INTRAVENOUS

## 2018-08-22 MED ORDER — MENTHOL 3 MG MT LOZG: 1.0000 | LOZENGE | OROMUCOSAL | Status: DC | PRN

## 2018-08-22 MED ORDER — SODIUM CHLORIDE 0.9 % IR SOLN
Status: DC | PRN
  Administered 2018-08-22: 1

## 2018-08-22 MED ORDER — SODIUM CHLORIDE 0.9 % IV SOLN
INTRAVENOUS | Status: DC
  Administered 2018-08-22: 17:00:00 via INTRAVENOUS

## 2018-08-22 MED ORDER — SODIUM CHLORIDE 0.9 % IV SOLN
INTRAVENOUS | Status: DC | PRN
  Administered 2018-08-22 (×2): 50 ug/min via INTRAVENOUS

## 2018-08-22 MED ORDER — METOCLOPRAMIDE HCL 5 MG/ML IJ SOLN: 5.0000 mg | Freq: Three times a day (TID) | INTRAMUSCULAR | Status: DC | PRN

## 2018-08-22 MED ORDER — SODIUM CHLORIDE 0.9 % IV SOLN: INTRAVENOUS | Status: DC

## 2018-08-22 MED ORDER — PROPOFOL 10 MG/ML IV BOLUS
INTRAVENOUS | Status: DC | PRN
  Administered 2018-08-22: 20 mg via INTRAVENOUS
  Administered 2018-08-22 (×3): 10 mg via INTRAVENOUS
  Administered 2018-08-22: 20 mg via INTRAVENOUS
  Administered 2018-08-22 (×2): 10 mg via INTRAVENOUS

## 2018-08-22 MED ORDER — GABAPENTIN 300 MG PO CAPS
300.0000 mg | ORAL_CAPSULE | Freq: Three times a day (TID) | ORAL | Status: DC
  Administered 2018-08-22 – 2018-08-23 (×3): 300 mg via ORAL
  Filled 2018-08-22 (×3): qty 1

## 2018-08-22 MED ORDER — COLCHICINE 0.6 MG PO TABS: 0.6000 mg | ORAL_TABLET | Freq: Two times a day (BID) | ORAL | Status: DC | PRN

## 2018-08-22 MED ORDER — METOCLOPRAMIDE HCL 5 MG PO TABS: 5.0000 mg | ORAL_TABLET | Freq: Three times a day (TID) | ORAL | Status: DC | PRN

## 2018-08-22 MED ORDER — AMLODIPINE BESYLATE 10 MG PO TABS
10.0000 mg | ORAL_TABLET | Freq: Every day | ORAL | Status: DC
  Administered 2018-08-23: 10 mg via ORAL
  Filled 2018-08-22: qty 1

## 2018-08-22 MED ORDER — FENTANYL CITRATE (PF) 100 MCG/2ML IJ SOLN
INTRAMUSCULAR | Status: AC
  Filled 2018-08-22: qty 2

## 2018-08-22 MED ORDER — ACETAMINOPHEN 500 MG PO TABS
1000.0000 mg | ORAL_TABLET | Freq: Four times a day (QID) | ORAL | Status: DC
  Administered 2018-08-22 – 2018-08-23 (×3): 1000 mg via ORAL
  Filled 2018-08-22 (×3): qty 2

## 2018-08-22 MED ORDER — CLOPIDOGREL BISULFATE 75 MG PO TABS
75.0000 mg | ORAL_TABLET | Freq: Every day | ORAL | Status: DC
  Administered 2018-08-23: 75 mg via ORAL
  Filled 2018-08-22: qty 1

## 2018-08-22 MED ORDER — ASPIRIN EC 81 MG PO TBEC: 81.0000 mg | DELAYED_RELEASE_TABLET | Freq: Two times a day (BID) | ORAL | 0 refills | Status: AC

## 2018-08-22 MED ORDER — FLEET ENEMA 7-19 GM/118ML RE ENEM: 1.0000 | ENEMA | Freq: Once | RECTAL | Status: DC | PRN

## 2018-08-22 MED ORDER — INSULIN ASPART 100 UNIT/ML ~~LOC~~ SOLN: 0.0000 [IU] | Freq: Every day | SUBCUTANEOUS | Status: DC

## 2018-08-22 MED ORDER — POVIDONE-IODINE 10 % EX SWAB
2.0000 "application " | Freq: Once | CUTANEOUS | Status: AC
  Administered 2018-08-22: 2 via TOPICAL

## 2018-08-22 MED ORDER — ASPIRIN 81 MG PO CHEW
81.0000 mg | CHEWABLE_TABLET | Freq: Two times a day (BID) | ORAL | Status: DC
  Administered 2018-08-22 – 2018-08-23 (×2): 81 mg via ORAL
  Filled 2018-08-22 (×2): qty 1

## 2018-08-22 MED ORDER — ONDANSETRON HCL 4 MG/2ML IJ SOLN: 4.0000 mg | Freq: Four times a day (QID) | INTRAMUSCULAR | Status: DC | PRN

## 2018-08-22 MED ORDER — MIDAZOLAM HCL 2 MG/2ML IJ SOLN
INTRAMUSCULAR | Status: DC | PRN
  Administered 2018-08-22: 2 mg via INTRAVENOUS

## 2018-08-22 MED ORDER — SODIUM CHLORIDE (PF) 0.9 % IJ SOLN
INTRAMUSCULAR | Status: DC | PRN
  Administered 2018-08-22: 20 mL

## 2018-08-22 MED ORDER — HYDROMORPHONE HCL 1 MG/ML IJ SOLN
INTRAMUSCULAR | Status: DC | PRN
  Administered 2018-08-22: 1 mg via INTRAVENOUS

## 2018-08-22 MED ORDER — LIDOCAINE 2% (20 MG/ML) 5 ML SYRINGE
INTRAMUSCULAR | Status: DC | PRN
  Administered 2018-08-22: 40 mg via INTRAVENOUS

## 2018-08-22 MED ORDER — HYDROMORPHONE HCL 1 MG/ML IJ SOLN
0.5000 mg | INTRAMUSCULAR | Status: DC | PRN
  Administered 2018-08-22: 0.5 mg via INTRAVENOUS
  Filled 2018-08-22: qty 1

## 2018-08-22 MED ORDER — INSULIN ASPART 100 UNIT/ML ~~LOC~~ SOLN
4.0000 [IU] | Freq: Three times a day (TID) | SUBCUTANEOUS | Status: DC
  Administered 2018-08-23 (×2): 4 [IU] via SUBCUTANEOUS

## 2018-08-22 MED ORDER — CHLORHEXIDINE GLUCONATE 4 % EX LIQD: 60.0000 mL | Freq: Once | CUTANEOUS | Status: DC

## 2018-08-22 MED ORDER — HYDROMORPHONE HCL 2 MG/ML IJ SOLN
INTRAMUSCULAR | Status: AC
  Filled 2018-08-22: qty 1

## 2018-08-22 MED ORDER — ACETAMINOPHEN 325 MG PO TABS: 325.0000 mg | ORAL_TABLET | Freq: Four times a day (QID) | ORAL | Status: DC | PRN

## 2018-08-22 MED ORDER — EPHEDRINE 5 MG/ML INJ
INTRAVENOUS | Status: AC
  Filled 2018-08-22: qty 10

## 2018-08-22 MED ORDER — WATER FOR IRRIGATION, STERILE IR SOLN
Status: DC | PRN
  Administered 2018-08-22: 1000 mL

## 2018-08-22 MED ORDER — SORBITOL 70 % SOLN
30.0000 mL | Freq: Every day | Status: DC | PRN
  Filled 2018-08-22: qty 30

## 2018-08-22 MED ORDER — SODIUM CHLORIDE (PF) 0.9 % IJ SOLN
INTRAMUSCULAR | Status: AC
  Filled 2018-08-22: qty 50

## 2018-08-22 MED ORDER — ONDANSETRON HCL 4 MG/2ML IJ SOLN
INTRAMUSCULAR | Status: DC | PRN
  Administered 2018-08-22: 4 mg via INTRAVENOUS

## 2018-08-22 MED ORDER — FENTANYL CITRATE (PF) 100 MCG/2ML IJ SOLN
25.0000 ug | INTRAMUSCULAR | Status: DC | PRN
  Administered 2018-08-22 (×2): 50 ug via INTRAVENOUS

## 2018-08-22 MED ORDER — DIPHENHYDRAMINE HCL 12.5 MG/5ML PO ELIX
12.5000 mg | ORAL_SOLUTION | ORAL | Status: DC | PRN
  Administered 2018-08-22 – 2018-08-23 (×3): 25 mg via ORAL
  Filled 2018-08-22 (×3): qty 10

## 2018-08-22 MED ORDER — DOCUSATE SODIUM 100 MG PO CAPS
100.0000 mg | ORAL_CAPSULE | Freq: Two times a day (BID) | ORAL | Status: DC
  Administered 2018-08-22 – 2018-08-23 (×2): 100 mg via ORAL
  Filled 2018-08-22 (×2): qty 1

## 2018-08-22 MED ORDER — LIDOCAINE 2% (20 MG/ML) 5 ML SYRINGE
INTRAMUSCULAR | Status: AC
  Filled 2018-08-22: qty 5

## 2018-08-22 MED ORDER — BUPIVACAINE IN DEXTROSE 0.75-8.25 % IT SOLN
INTRATHECAL | Status: DC | PRN
  Administered 2018-08-22: 1.6 mL via INTRATHECAL

## 2018-08-22 MED ORDER — BUPIVACAINE-EPINEPHRINE (PF) 0.25% -1:200000 IJ SOLN
INTRAMUSCULAR | Status: AC
  Filled 2018-08-22: qty 30

## 2018-08-22 MED ORDER — SENNOSIDES-DOCUSATE SODIUM 8.6-50 MG PO TABS: 1.0000 | ORAL_TABLET | Freq: Every evening | ORAL | Status: DC | PRN

## 2018-08-22 MED ORDER — ALLOPURINOL 300 MG PO TABS
300.0000 mg | ORAL_TABLET | Freq: Every day | ORAL | Status: DC
  Administered 2018-08-23: 300 mg via ORAL
  Filled 2018-08-22: qty 1

## 2018-08-22 MED ORDER — PROPOFOL 10 MG/ML IV BOLUS
INTRAVENOUS | Status: AC
  Filled 2018-08-22: qty 60

## 2018-08-22 MED ORDER — MIDAZOLAM HCL 2 MG/2ML IJ SOLN
INTRAMUSCULAR | Status: AC
  Filled 2018-08-22: qty 2

## 2018-08-22 MED ORDER — BUPIVACAINE-EPINEPHRINE 0.25% -1:200000 IJ SOLN
INTRAMUSCULAR | Status: DC | PRN
  Administered 2018-08-22: 20 mL

## 2018-08-22 MED ORDER — PHENOL 1.4 % MT LIQD: 1.0000 | OROMUCOSAL | Status: DC | PRN

## 2018-08-22 MED ORDER — TRANEXAMIC ACID 1000 MG/10ML IV SOLN
INTRAVENOUS | Status: DC | PRN
  Administered 2018-08-22: 12:00:00 2000 mg via TOPICAL

## 2018-08-22 MED ORDER — TRANEXAMIC ACID-NACL 1000-0.7 MG/100ML-% IV SOLN
1000.0000 mg | Freq: Once | INTRAVENOUS | Status: AC
  Administered 2018-08-22: 1000 mg via INTRAVENOUS
  Filled 2018-08-22: qty 100

## 2018-08-22 SURGICAL SUPPLY — 63 items
APL SKNCLS STERI-STRIP NONHPOA (GAUZE/BANDAGES/DRESSINGS) ×1
BAG DECANTER FOR FLEXI CONT (MISCELLANEOUS) ×3 IMPLANT
BAG SPEC THK2 15X12 ZIP CLS (MISCELLANEOUS) ×1
BAG ZIPLOCK 12X15 (MISCELLANEOUS) ×3 IMPLANT
BALL HIP CERAMIC (Hips) IMPLANT
BENZOIN TINCTURE PRP APPL 2/3 (GAUZE/BANDAGES/DRESSINGS) ×2 IMPLANT
BLADE SAW SAG 73X25 THK (BLADE) ×1
BLADE SAW SGTL 73X25 THK (BLADE) ×2 IMPLANT
CLOSURE STERI-STRIP 1/2X4 (GAUZE/BANDAGES/DRESSINGS) ×1
CLOSURE WOUND 1/2 X4 (GAUZE/BANDAGES/DRESSINGS) ×1
CLSR STERI-STRIP ANTIMIC 1/2X4 (GAUZE/BANDAGES/DRESSINGS) ×2 IMPLANT
COVER SURGICAL LIGHT HANDLE (MISCELLANEOUS) ×3 IMPLANT
COVER WAND RF STERILE (DRAPES) IMPLANT
CUP SECTOR GRIPTON 50MM (Cup) ×2 IMPLANT
DECANTER SPIKE VIAL GLASS SM (MISCELLANEOUS) ×9 IMPLANT
DRAPE INCISE IOBAN 66X45 STRL (DRAPES) ×3 IMPLANT
DRAPE ORTHO SPLIT 77X108 STRL (DRAPES) ×6
DRAPE POUCH INSTRU U-SHP 10X18 (DRAPES) ×3 IMPLANT
DRAPE SURG ORHT 6 SPLT 77X108 (DRAPES) ×2 IMPLANT
DRAPE U-SHAPE 47X51 STRL (DRAPES) ×3 IMPLANT
DRESSING AQUACEL AG SP 3.5X10 (GAUZE/BANDAGES/DRESSINGS) ×1 IMPLANT
DRSG AQUACEL AG SP 3.5X10 (GAUZE/BANDAGES/DRESSINGS) ×3
DRSG EMULSION OIL 3X16 NADH (GAUZE/BANDAGES/DRESSINGS) ×3 IMPLANT
DRSG PAD ABDOMINAL 8X10 ST (GAUZE/BANDAGES/DRESSINGS) ×6 IMPLANT
ELECT BLADE TIP CTD 4 INCH (ELECTRODE) ×3 IMPLANT
ELECT REM PT RETURN 15FT ADLT (MISCELLANEOUS) ×3 IMPLANT
FACESHIELD WRAPAROUND (MASK) ×9 IMPLANT
FACESHIELD WRAPAROUND OR TEAM (MASK) ×3 IMPLANT
GAUZE SPONGE 4X4 12PLY STRL (GAUZE/BANDAGES/DRESSINGS) ×3 IMPLANT
GLOVE BIOGEL PI IND STRL 8 (GLOVE) ×2 IMPLANT
GLOVE BIOGEL PI INDICATOR 8 (GLOVE) ×4
GLOVE SURG ORTHO 8.0 STRL STRW (GLOVE) ×3 IMPLANT
GLOVE SURG SS PI 7.5 STRL IVOR (GLOVE) ×3 IMPLANT
GOWN STRL REUS W/TWL XL LVL3 (GOWN DISPOSABLE) ×6 IMPLANT
HIP BALL CERAMIC (Hips) ×3 IMPLANT
HOLDER FOLEY CATH W/STRAP (MISCELLANEOUS) ×3 IMPLANT
HOOD PEEL AWAY FLYTE STAYCOOL (MISCELLANEOUS) ×3 IMPLANT
IMMOBILIZER KNEE 20 (SOFTGOODS) ×3
IMMOBILIZER KNEE 20 THIGH 36 (SOFTGOODS) ×1 IMPLANT
KIT BASIN OR (CUSTOM PROCEDURE TRAY) ×3 IMPLANT
KIT TURNOVER KIT A (KITS) IMPLANT
LINER PINN ALTRX ACE P4 HIP (Liner) ×2 IMPLANT
MANIFOLD NEPTUNE II (INSTRUMENTS) ×3 IMPLANT
NEEDLE HYPO 22GX1.5 SAFETY (NEEDLE) ×6 IMPLANT
NS IRRIG 1000ML POUR BTL (IV SOLUTION) ×3 IMPLANT
PACK TOTAL JOINT (CUSTOM PROCEDURE TRAY) ×3 IMPLANT
PROTECTOR NERVE ULNAR (MISCELLANEOUS) ×3 IMPLANT
STAPLER VISISTAT 35W (STAPLE) IMPLANT
STEM AML 12X155X30 STD SM 6IN (Joint) ×2 IMPLANT
STRIP CLOSURE SKIN 1/2X4 (GAUZE/BANDAGES/DRESSINGS) ×1 IMPLANT
SUCTION FRAZIER HANDLE 12FR (TUBING) ×2
SUCTION TUBE FRAZIER 12FR DISP (TUBING) ×1 IMPLANT
SUT ETHIBOND NAB CT1 #1 30IN (SUTURE) ×12 IMPLANT
SUT MNCRL 3 0 RB1 (SUTURE) ×1 IMPLANT
SUT MONOCRYL 3 0 RB1 (SUTURE) ×2
SUT VIC AB 0 CT1 36 (SUTURE) ×6 IMPLANT
SUT VIC AB 2-0 CT1 27 (SUTURE) ×6
SUT VIC AB 2-0 CT1 TAPERPNT 27 (SUTURE) ×2 IMPLANT
SYR CONTROL 10ML LL (SYRINGE) ×6 IMPLANT
TOWEL OR 17X26 10 PK STRL BLUE (TOWEL DISPOSABLE) ×3 IMPLANT
TRAY FOLEY MTR SLVR 16FR STAT (SET/KITS/TRAYS/PACK) ×3 IMPLANT
WATER STERILE IRR 1000ML POUR (IV SOLUTION) ×6 IMPLANT
YANKAUER SUCT BULB TIP 10FT TU (MISCELLANEOUS) ×3 IMPLANT

## 2018-08-22 NOTE — Anesthesia Procedure Notes (Signed)
Spinal  Patient location during procedure: OR Start time: 08/22/2018 10:47 AM End time: 08/22/2018 10:57 AM Staffing Anesthesiologist: Freddrick March, MD Performed: anesthesiologist  Preanesthetic Checklist Completed: patient identified, surgical consent, pre-op evaluation, timeout performed, IV checked, risks and benefits discussed and monitors and equipment checked Spinal Block Patient position: sitting Prep: site prepped and draped and DuraPrep Patient monitoring: cardiac monitor, continuous pulse ox and blood pressure Approach: midline Location: L3-4 Injection technique: single-shot Needle Needle type: Quincke  Needle gauge: 22 G Needle length: 9 cm Assessment Sensory level: T6 Additional Notes Functioning IV was confirmed and monitors were applied. Sterile prep and drape, including hand hygiene and sterile gloves were used. The patient was positioned and the spine was prepped. The skin was anesthetized with lidocaine.  Free flow of clear CSF was obtained prior to injecting local anesthetic into the CSF.  The spinal needle aspirated freely following injection.  The needle was carefully withdrawn.  The patient tolerated the procedure well.

## 2018-08-22 NOTE — Transfer of Care (Signed)
Immediate Anesthesia Transfer of Care Note  Patient: April Shaffer  Procedure(s) Performed: TOTAL HIP ARTHROPLASTY (Right Hip)  Patient Location: PACU  Anesthesia Type:Spinal  Level of Consciousness: awake  Airway & Oxygen Therapy: Patient Spontanous Breathing and Patient connected to face mask oxygen  Post-op Assessment: Report given to RN and Post -op Vital signs reviewed and stable  Post vital signs: Reviewed and stable  Last Vitals:  Vitals Value Taken Time  BP 114/73 08/22/18 1311  Temp    Pulse 79 08/22/18 1314  Resp 18 08/22/18 1314  SpO2 99 % 08/22/18 1314  Vitals shown include unvalidated device data.  Last Pain:  Vitals:   08/22/18 0900  TempSrc:   PainSc: 0-No pain      Patients Stated Pain Goal: 5 (81/85/63 1497)  Complications: No apparent anesthesia complications

## 2018-08-22 NOTE — Discharge Instructions (Signed)

## 2018-08-22 NOTE — Op Note (Signed)
NAME: PEBBLES, ZEIDERS MEDICAL RECORD OO:[id number] ACCOUNT [id number] DATE OF BIRTH:1951/01/15 FACILITY: [facility] LOCATION: [facility] PHYSICIAN:W. Koran Seabrook JR., MD  OPERATIVE REPORT  DATE OF PROCEDURE:  08/22/2018  PREOPERATIVE DIAGNOSIS:  Severe osteoarthritis, left hip.  POSTOPERATIVE DIAGNOSIS:  Severe osteoarthritis, left hip.  OPERATION:  Left total hip replacement (DePuy AML stem with a 12 mm small statured femoral stem, +5 mm neck length with 32 mm Biolox delta ceramic femoral head, 50 mm Pinnacle Gription acetabular shell sector cup 50 mm diameter, ALTRX polyethylene liner  +4 mm 10 degree).  SURGEON:  Vangie Bicker, MD  ASSISTANTMarjo Bicker, PA  ANESTHESIA:  Spinal anesthetic.  ESTIMATED BLOOD LOSS:  350 mL.  DESCRIPTION OF PROCEDURE:  Lateral position with a posterior approach to the hip was made.  We split the iliotibial band and gluteus maximus followed by the short external rotators and a T capsulotomy in the hip.  Delivered the diseased hip and cut it  about 1 fingerbreadth above the lesser trochanter.  We then progressively reamed and broached to accept a 12 mm small-statured stem.  Attention was next directed at the acetabulum.  A significant amount of acetabular hypertrophy or possible early protrusio was noted.  We identified the anterior, posterior and superior aspect of the acetabulum, placed wing retractors.  We then  progressively reamed for a 1 mm, under reaming to accept a 50 mm cup in about 45-50 degrees of abduction and 15 degrees of anteversion.  Final cup was placed with a trial liner followed by trialing off the broach with a +5 mm neck length leading to  excellent stability relative to full flexion, full adduction and internal rotation to at least 60 degrees before there was any tendency to dislocate.  Final components were inserted.  We had placed a trial poly.  We placed it with the final poly with the buildup at about the 9 o'clock position.  We  then placed the femoral prosthesis and then trialed off it again and accepted the above-mentioned neck  length as being appropriate with good equalization of the neck lengths.  The T capsulotomy of the hip was closed with #1 Ethibond.  The iliotibial band and gluteus maximus fascia with a running Ethibond, subcu tissues with 2-0 Vicryl and the skin with a  Monocryl.  A lightly compressive sterile dressing was applied, taken to recovery room in stable condition.  LN/NUANCE  D:08/22/2018 T:08/22/2018 JOB:[id number]

## 2018-08-22 NOTE — Plan of Care (Signed)
Plan of care 

## 2018-08-22 NOTE — Interval H&P Note (Signed)
History and Physical Interval Note:  08/22/2018 9:24 AM  April Shaffer  has presented today for surgery, with the diagnosis of OA RIGHT HIP.  The various methods of treatment have been discussed with the patient and family. After consideration of risks, benefits and other options for treatment, the patient has consented to  Procedure(s): TOTAL HIP ARTHROPLASTY (Right) as a surgical intervention.  The patient's history has been reviewed, patient examined, no change in status, stable for surgery.  I have reviewed the patient's chart and labs.  Questions were answered to the patient's satisfaction.     Yvette Rack

## 2018-08-22 NOTE — Plan of Care (Signed)
progressing 

## 2018-08-22 NOTE — Anesthesia Postprocedure Evaluation (Signed)
Anesthesia Post Note  Patient: April Shaffer  Procedure(s) Performed: TOTAL HIP ARTHROPLASTY (Right Hip)     Patient location during evaluation: PACU Anesthesia Type: Spinal Level of consciousness: oriented and awake and alert Pain management: pain level controlled Vital Signs Assessment: post-procedure vital signs reviewed and stable Respiratory status: spontaneous breathing, respiratory function stable and patient connected to nasal cannula oxygen Cardiovascular status: blood pressure returned to baseline and stable Postop Assessment: no headache, no backache and no apparent nausea or vomiting Anesthetic complications: no    Last Vitals:  Vitals:   08/22/18 1400 08/22/18 1415  BP: 101/66   Pulse: 67   Resp: 18   Temp:  (!) 35 C  SpO2: 97%     Last Pain:  Vitals:   08/22/18 1415  TempSrc:   PainSc: 0-No pain    LLE Motor Response: Responds to commands (08/22/18 1415) LLE Sensation: No numbness;No tingling (08/22/18 1415) RLE Motor Response: Responds to commands (08/22/18 1415) RLE Sensation: Decreased (08/22/18 1415) L Sensory Level: S1-Sole of foot, small toes (08/22/18 1415) R Sensory Level: S1-Sole of foot, small toes (08/22/18 1415)  Shem Plemmons L Lucian Baswell

## 2018-08-22 NOTE — Evaluation (Signed)
Physical Therapy Evaluation Patient Details Name: April Shaffer MRN: [id number] DOB: Oct 24, 1950 Today's Date: 08/22/2018   History of Present Illness  s/p Right posterior THA PMHx: CAD, DM  Clinical Impression  Pt is s/p THA resulting in the deficits listed below (see PT Problem List).  Pt drowsy today, limited by pain but agreeable to PT. Able to amb short distance in room   Pt will benefit from skilled PT to increase their independence and safety with mobility to allow discharge to the venue listed below.      Follow Up Recommendations Follow surgeon's recommendation for DC plan and follow-up therapies    Equipment Recommendations  None recommended by PT;Other (comment)(delivered)    Recommendations for Other Services       Precautions / Restrictions Precautions Precautions: Posterior Hip Required Braces or Orthoses: Knee Immobilizer - Right Restrictions Weight Bearing Restrictions: No Other Position/Activity Restrictions: WBAT      Mobility  Bed Mobility Overal bed mobility: Needs Assistance Bed Mobility: Supine to Sit     Supine to sit: Min assist     General bed mobility comments: assist with RL E  Transfers Overall transfer level: Needs assistance Equipment used: Rolling walker (2 wheeled) Transfers: Sit to/from Stand Sit to Stand: Min assist         General transfer comment: assist to rise and stabilize, cues for THP and hand placement  Ambulation/Gait Ambulation/Gait assistance: Min assist Gait Distance (Feet): 12 Feet(in room) Assistive device: Rolling walker (2 wheeled) Gait Pattern/deviations: Step-to pattern     General Gait Details: cues for sequence and RW safety  Stairs            Wheelchair Mobility    Modified Rankin (Stroke Patients Only)       Balance                                             Pertinent Vitals/Pain Pain Assessment: 0-10 Pain Score: 7  Pain Location: right hip Pain Descriptors  / Indicators: Sore Pain Intervention(s): Premedicated before session;Monitored during session;Limited activity within patient's tolerance;Repositioned    Home Living Family/patient expects to be discharged to:: Private residence Living Arrangements: Spouse/significant other Available Help at Discharge: Family Type of Home: House Home Access: Stairs to enter   Technical brewer of Steps: 1 Home Layout: One level Home Equipment: Environmental consultant - 2 wheels;Bedside commode      Prior Function Level of Independence: Independent               Hand Dominance        Extremity/Trunk Assessment   Upper Extremity Assessment Upper Extremity Assessment: Defer to OT evaluation    Lower Extremity Assessment Lower Extremity Assessment: RLE deficits/detail RLE Deficits / Details: ankle WFL; knee and hip grossly 2+/5 within limits of precautions       Communication   Communication: No difficulties  Cognition Arousal/Alertness: Lethargic(d/t meds--sleepy) Behavior During Therapy: WFL for tasks assessed/performed Overall Cognitive Status: Within Functional Limits for tasks assessed                                        General Comments      Exercises Total Joint Exercises Ankle Circles/Pumps: AROM;10 reps;Both   Assessment/Plan    PT Assessment Patient needs continued PT  services  PT Problem List Decreased strength;Decreased range of motion;Decreased activity tolerance;Decreased mobility;Pain;Decreased knowledge of use of DME       PT Treatment Interventions DME instruction;Gait training;Therapeutic exercise;Functional mobility training;Therapeutic activities;Patient/family education    PT Goals (Current goals can be found in the Care Plan section)  Acute Rehab PT Goals PT Goal Formulation: With patient Time For Goal Achievement: 08/28/18 Potential to Achieve Goals: Good    Frequency 7X/week   Barriers to discharge        Co-evaluation                AM-PAC PT "6 Clicks" Mobility  Outcome Measure Help needed turning from your back to your side while in a flat bed without using bedrails?: A Little Help needed moving from lying on your back to sitting on the side of a flat bed without using bedrails?: A Little Help needed moving to and from a bed to a chair (including a wheelchair)?: A Little Help needed standing up from a chair using your arms (e.g., wheelchair or bedside chair)?: A Little Help needed to walk in hospital room?: A Lot Help needed climbing 3-5 steps with a railing? : A Lot 6 Click Score: 16    End of Session Equipment Utilized During Treatment: Gait belt Activity Tolerance: Patient tolerated treatment well Patient left: in chair;with chair alarm set;with call bell/phone within reach Nurse Communication: Mobility status PT Visit Diagnosis: Muscle weakness (generalized) (M62.81);Difficulty in walking, not elsewhere classified (R26.2)    Time: 8119-14781703-1726 PT Time Calculation (min) (ACUTE ONLY): 23 min   Charges:   PT Evaluation $PT Eval Low Complexity: 1 Low PT Treatments $Gait Training: 8-22 mins        Drucilla Chaletara Joby Hershkowitz, PT  Pager: [phone] Acute Rehab Dept [facility]): [phone]   08/22/2018   [facility] HospitalWILLIAMS,Shabreka Coulon 08/22/2018, 5:47 PM

## 2018-08-23 DIAGNOSIS — E1142 Type 2 diabetes mellitus with diabetic polyneuropathy: Secondary | ICD-10-CM | POA: Diagnosis present

## 2018-08-23 DIAGNOSIS — Z90722 Acquired absence of ovaries, bilateral: Secondary | ICD-10-CM | POA: Diagnosis not present

## 2018-08-23 DIAGNOSIS — Z79899 Other long term (current) drug therapy: Secondary | ICD-10-CM | POA: Diagnosis not present

## 2018-08-23 DIAGNOSIS — I10 Essential (primary) hypertension: Secondary | ICD-10-CM | POA: Diagnosis present

## 2018-08-23 DIAGNOSIS — G4730 Sleep apnea, unspecified: Secondary | ICD-10-CM | POA: Diagnosis present

## 2018-08-23 DIAGNOSIS — Z8249 Family history of ischemic heart disease and other diseases of the circulatory system: Secondary | ICD-10-CM | POA: Diagnosis not present

## 2018-08-23 DIAGNOSIS — Z888 Allergy status to other drugs, medicaments and biological substances status: Secondary | ICD-10-CM | POA: Diagnosis not present

## 2018-08-23 DIAGNOSIS — M109 Gout, unspecified: Secondary | ICD-10-CM | POA: Diagnosis present

## 2018-08-23 DIAGNOSIS — Z885 Allergy status to narcotic agent status: Secondary | ICD-10-CM | POA: Diagnosis not present

## 2018-08-23 DIAGNOSIS — Z7902 Long term (current) use of antithrombotics/antiplatelets: Secondary | ICD-10-CM | POA: Diagnosis not present

## 2018-08-23 DIAGNOSIS — Z7984 Long term (current) use of oral hypoglycemic drugs: Secondary | ICD-10-CM | POA: Diagnosis not present

## 2018-08-23 DIAGNOSIS — Z7982 Long term (current) use of aspirin: Secondary | ICD-10-CM | POA: Diagnosis not present

## 2018-08-23 DIAGNOSIS — F17210 Nicotine dependence, cigarettes, uncomplicated: Secondary | ICD-10-CM | POA: Diagnosis present

## 2018-08-23 DIAGNOSIS — Z833 Family history of diabetes mellitus: Secondary | ICD-10-CM | POA: Diagnosis not present

## 2018-08-23 DIAGNOSIS — I2510 Atherosclerotic heart disease of native coronary artery without angina pectoris: Secondary | ICD-10-CM | POA: Diagnosis present

## 2018-08-23 DIAGNOSIS — Z20828 Contact with and (suspected) exposure to other viral communicable diseases: Secondary | ICD-10-CM | POA: Diagnosis present

## 2018-08-23 DIAGNOSIS — Z90710 Acquired absence of both cervix and uterus: Secondary | ICD-10-CM | POA: Diagnosis not present

## 2018-08-23 DIAGNOSIS — E1151 Type 2 diabetes mellitus with diabetic peripheral angiopathy without gangrene: Secondary | ICD-10-CM | POA: Diagnosis present

## 2018-08-23 DIAGNOSIS — M1611 Unilateral primary osteoarthritis, right hip: Secondary | ICD-10-CM | POA: Diagnosis present

## 2018-08-23 DIAGNOSIS — Z9079 Acquired absence of other genital organ(s): Secondary | ICD-10-CM | POA: Diagnosis not present

## 2018-08-23 DIAGNOSIS — E785 Hyperlipidemia, unspecified: Secondary | ICD-10-CM | POA: Diagnosis present

## 2018-08-23 LAB — BASIC METABOLIC PANEL
Anion gap: 9 (ref 5–15)
BUN: 9 mg/dL (ref 8–23)
CO2: 25 mmol/L (ref 22–32)
Calcium: 7.8 mg/dL — ABNORMAL LOW (ref 8.9–10.3)
Chloride: 103 mmol/L (ref 98–111)
Creatinine, Ser: 0.62 mg/dL (ref 0.44–1.00)
GFR calc Af Amer: >60 mL/min (ref >60)
GFR calc non Af Amer: >60 mL/min (ref >60)
Glucose, Bld: 160 mg/dL — ABNORMAL HIGH (ref 70–99)
Potassium: 3.4 mmol/L — ABNORMAL LOW (ref 3.5–5.1)
Sodium: 137 mmol/L (ref 135–145)

## 2018-08-23 LAB — CBC
HCT: 31.6 % — ABNORMAL LOW (ref 36.0–46.0)
Hemoglobin: 10 g/dL — ABNORMAL LOW (ref 12.0–15.0)
MCH: 30.4 pg (ref 26.0–34.0)
MCHC: 31.6 g/dL (ref 30.0–36.0)
MCV: 96 fL (ref 80.0–100.0)
Platelets: 176 10*3/uL (ref 150–400)
RBC: 3.29 MIL/uL — ABNORMAL LOW (ref 3.87–5.11)
RDW: 13.8 % (ref 11.5–15.5)
WBC: 10.2 10*3/uL (ref 4.0–10.5)
nRBC: 0 % (ref 0.0–0.2)

## 2018-08-23 LAB — GLUCOSE, CAPILLARY
Glucose-Capillary: 143 mg/dL — ABNORMAL HIGH (ref 70–99)
Glucose-Capillary: 153 mg/dL — ABNORMAL HIGH (ref 70–99)

## 2018-08-23 NOTE — Progress Notes (Signed)
Physical Therapy Treatment Patient Details Name: April DartingCynthia C Shaffer MRN: [id number] DOB: 04/19/1950 Today's Date: 08/23/2018    History of Present Illness s/p Right posterior THA PMHx: CAD, DM    PT Comments    Progressing well. Will see again for second session later this pm and then pt should be ready for d.c from PT standpoint   Follow Up Recommendations  Follow surgeon's recommendation for DC plan and follow-up therapies     Equipment Recommendations       Recommendations for Other Services       Precautions / Restrictions Precautions Precautions: Posterior Hip Restrictions Weight Bearing Restrictions: No Other Position/Activity Restrictions: WBAT    Mobility  Bed Mobility               General bed mobility comments: in recliner after OT session  Transfers Overall transfer level: Needs assistance Equipment used: Rolling walker (2 wheeled) Transfers: Sit to/from Stand Sit to Stand: Min guard         General transfer comment: min/gaurd to rise and stabilize, cues for THP and hand placement  Ambulation/Gait Ambulation/Gait assistance: Min guard Gait Distance (Feet): 60 Feet Assistive device: Rolling walker (2 wheeled) Gait Pattern/deviations: Step-to pattern     General Gait Details: cues for sequence and RW safety, trunk extension and  for THP with turns   Stairs             Wheelchair Mobility    Modified Rankin (Stroke Patients Only)       Balance                                            Cognition Arousal/Alertness: Awake/alert Behavior During Therapy: WFL for tasks assessed/performed Overall Cognitive Status: Within Functional Limits for tasks assessed                                        Exercises Total Joint Exercises Ankle Circles/Pumps: AROM;10 reps;Both Quad Sets: AROM;Both;10 reps Short Arc Quad: AROM;Right;10 reps Heel Slides: AROM;AAROM;Right;10 reps Hip ABduction/ADduction:  Right;10 reps;AAROM Long Arc Quad: AROM;Right;10 reps    General Comments        Pertinent Vitals/Pain Pain Assessment: 0-10 Pain Score: 4  Pain Location: right hip Pain Descriptors / Indicators: Sore Pain Intervention(s): Limited activity within patient's tolerance;Monitored during session;Premedicated before session    Home Living                      Prior Function            PT Goals (current goals can now be found in the care plan section) Acute Rehab PT Goals PT Goal Formulation: With patient Time For Goal Achievement: 08/28/18 Potential to Achieve Goals: Good Progress towards PT goals: Progressing toward goals    Frequency    7X/week      PT Plan Current plan remains appropriate    Co-evaluation              AM-PAC PT "6 Clicks" Mobility   Outcome Measure  Help needed turning from your back to your side while in a flat bed without using bedrails?: A Little Help needed moving from lying on your back to sitting on the side of a flat bed without using bedrails?: A Little Help  needed moving to and from a bed to a chair (including a wheelchair)?: A Little Help needed standing up from a chair using your arms (e.g., wheelchair or bedside chair)?: A Little Help needed to walk in hospital room?: A Little Help needed climbing 3-5 steps with a railing? : A Lot 6 Click Score: 17    End of Session Equipment Utilized During Treatment: Gait belt Activity Tolerance: Patient tolerated treatment well Patient left: in chair;with chair alarm set;with call bell/phone within reach Nurse Communication: Mobility status PT Visit Diagnosis: Muscle weakness (generalized) (M62.81);Difficulty in walking, not elsewhere classified (R26.2)     Time: 4481-8563 PT Time Calculation (min) (ACUTE ONLY): 21 min  Charges:  $Gait Training: 8-22 mins                     Kenyon Ana, PT  Pager: [phone] Acute Rehab Dept [facility]):  [phone]   08/23/2018    [facility] 08/23/2018, 12:34 PM

## 2018-08-23 NOTE — Progress Notes (Signed)
   08/23/18 1508  PT Visit Information  Last PT Received On 08/23/18  Pt progressing well; recommend initial close supervision/assist for safety, continues to require cues for THP during mobility.   Assistance Needed +1  History of Present Illness s/p Right posterior THA PMHx: CAD, DM  Precautions  Precautions Posterior Hip  Precaution Booklet Issued Yes (comment)  Precaution Comments handout reviewed   Restrictions  Weight Bearing Restrictions No  Other Position/Activity Restrictions WBAT  Pain Assessment  Pain Assessment 0-10  Pain Score 4  Pain Location right hip  Pain Descriptors / Indicators Sore  Pain Intervention(s) Limited activity within patient's tolerance;Monitored during session  Cognition  Arousal/Alertness Awake/alert  Behavior During Therapy WFL for tasks assessed/performed  Overall Cognitive Status Within Functional Limits for tasks assessed  Bed Mobility  Overal bed mobility Needs Assistance  Bed Mobility Supine to Sit;Sit to Supine  Supine to sit Min assist  Sit to supine Min assist  General bed mobility comments assist with RLE   Transfers  Overall transfer level Needs assistance  Equipment used Rolling walker (2 wheeled)  Transfers Sit to/from Stand  Sit to Stand Min assist;Min guard  General transfer comment 2 attempts d/t pain, light assist to rise and stabilize, cues for THP and hand placement  Ambulation/Gait  Ambulation/Gait assistance Min guard;Supervision  Gait Distance (Feet) 30 Feet  Assistive device Rolling walker (2 wheeled)  Gait Pattern/deviations Step-to pattern  General Gait Details cues for sequence and RW safety, trunk extension and  for THP with turns  Gait velocity decr  Stairs Yes  Stairs assistance Min assist;Min guard  Stair Management No rails;Step to pattern;Backwards;With walker  Number of Stairs 1 (x2)  General stair comments cues for sequence and technique  PT - End of Session  Equipment Utilized During Treatment Gait  belt  Activity Tolerance Patient tolerated treatment well  Patient left in chair;with chair alarm set;with call bell/phone within reach  Nurse Communication Mobility status   PT - Assessment/Plan  PT Plan Current plan remains appropriate  PT Visit Diagnosis Muscle weakness (generalized) (M62.81);Difficulty in walking, not elsewhere classified (R26.2)  PT Frequency (ACUTE ONLY) 7X/week  Follow Up Recommendations Follow surgeon's recommendation for DC plan and follow-up therapies  AM-PAC PT "6 Clicks" Mobility Outcome Measure (Version 2)  Help needed turning from your back to your side while in a flat bed without using bedrails? 3  Help needed moving from lying on your back to sitting on the side of a flat bed without using bedrails? 3  Help needed moving to and from a bed to a chair (including a wheelchair)? 3  Help needed standing up from a chair using your arms (e.g., wheelchair or bedside chair)? 3  Help needed to walk in hospital room? 3  Help needed climbing 3-5 steps with a railing?  3  6 Click Score 18  Consider Recommendation of Discharge To: Home with [facility]  PT Goal Progression  Progress towards PT goals Progressing toward goals  Acute Rehab PT Goals  PT Goal Formulation With patient  Time For Goal Achievement 08/28/18  Potential to Achieve Goals Good  PT Time Calculation  PT Start Time (ACUTE ONLY) 1449  PT Stop Time (ACUTE ONLY) 1506  PT Time Calculation (min) (ACUTE ONLY) 17 min  PT General Charges  $$ ACUTE PT VISIT 1 Visit  PT Treatments  $Gait Training 8-22 mins

## 2018-08-23 NOTE — TOC Initial Note (Signed)
Transition of Care [facility]) - Initial/Assessment Note    Patient Details  Name: April Shaffer MRN: [id number] Date of Birth: Mar 27, 1950  Transition of Care [facility]) CM/SW Contact:    Joaquin Courts, RN Phone Number: 08/23/2018, 11:58 AM  Clinical Narrative:     CM spoke with patient at bedside. [facility] [place], reports has a rolling walker and 3-in-1.                Expected Discharge Plan: [place] Barriers to Discharge: No Barriers Identified   Patient Goals and CMS Choice Patient states their goals for this hospitalization and ongoing recovery are:: to go home CMS Medicare.gov Compare Post Acute Care list provided to:: Patient Choice offered to / list presented to : Patient  Expected Discharge Plan and Services Expected Discharge Plan: [place]   Discharge Planning Services: CM Consult Post Acute Care Choice: [place] arrangements for the past 2 months: Single Family Home Expected Discharge Date: 08/23/18               DME Arranged: N/A DME Agency: NA       HH Arranged: PT HH Agency: [facility] [employer] Date [place]: 08/23/18 Time [place]: [place] Representative spoke with at [place]: pre arranged by md office  Prior Living Arrangements/Services Living arrangements for the past 2 months: [place] Lives with:: Spouse Patient language and need for interpreter reviewed:: Yes Do you feel safe going back to the place where you live?: Yes      Need for Family Participation in Patient Care: Yes (Comment) Care giver support system in place?: Yes (comment)   Criminal Activity/Legal Involvement Pertinent to Current Situation/Hospitalization: No - Comment as needed  Activities of Daily Living Home Assistive Devices/Equipment: Eyeglasses ADL Screening (condition at time of admission) Patient's cognitive ability adequate to safely  complete daily activities?: Yes Is the patient deaf or have difficulty hearing?: No Does the patient have difficulty seeing, even when wearing glasses/contacts?: No Does the patient have difficulty concentrating, remembering, or making decisions?: No Patient able to express need for assistance with ADLs?: Yes Does the patient have difficulty dressing or bathing?: No Independently performs ADLs?: Yes (appropriate for developmental age) Does the patient have difficulty walking or climbing stairs?: Yes Weakness of Legs: None Weakness of Arms/Hands: None  Permission Sought/Granted                  Emotional Assessment Appearance:: Appears stated age Attitude/Demeanor/Rapport: Engaged Affect (typically observed): Accepting Orientation: : Oriented to Self, Oriented to Place, Oriented to  Time, Oriented to Situation   Psych Involvement: No (comment)  Admission diagnosis:  OA RIGHT HIP Patient Active Problem List   Diagnosis Date Noted  . Primary localized osteoarthritis of right hip 08/22/2018  . Carpal tunnel syndrome, bilateral 08/06/2018  . Type 2 diabetes mellitus with peripheral neuropathy ([place]) 08/06/2018  . Degenerative joint disease of right hip 08/06/2018  . Preoperative clearance 03/26/2018  . Upper back pain on left side 10/30/2016  . Encounter for postoperative carotid endarterectomy surveillance 12/07/2015  . Varicosities of leg 06/03/2015  . Acute left lumbar radiculopathy 04/15/2014  . Chronic sciatica of right side 03/17/2013  . Diabetic peripheral neuropathy ([place]) 03/17/2013  . Coronary artery disease   . Carotid artery stenosis s/p CEA 2013 10/16/2011  . Carotid artery occlusion without infarction, right 09/17/2011  . Carotid  bruit 08/28/2011  . Gout 04/13/2011  . DM type 2 with diabetic peripheral neuropathy (HCC) 04/02/2008  . Hyperlipidemia associated with type 2 diabetes mellitus (HCC) 04/02/2008  . Essential hypertension 04/02/2008  . DEGENERATIVE DISC  DISEASE, LUMBOSACRAL SPINE 04/02/2008   PCP:  Julieanne MansonMulberry, Elizabeth, MD [facility]   [facility] [address] - , [place] - [address] [place] [address] Phone: [phone] Fax: [phone]     Social Determinants of Health (SDOH) Interventions    Readmission Risk Interventions No flowsheet data found.

## 2018-08-23 NOTE — Discharge Summary (Signed)
Discharge Summary  Patient ID: April Shaffer MRN: [id number] DOB/AGE: 07/05/1950 68 y.o.  Admit date: 08/22/2018 Discharge date: 08/23/2018  Admission Diagnoses:  Primary localized osteoarthritis of right hip  Discharge Diagnoses:  Principal Problem:   Primary localized osteoarthritis of right hip Active Problems:   Degenerative joint disease of right hip   Past Medical History:  Diagnosis Date  . Arthritis   . Carotid artery narrowing   . Coronary artery disease    Cardiac catheterization November 2013: 50% ostial LAD stenosis 50% mid stenosis. 30% disease in the left circumflex.  . Diabetes mellitus, type 2 (HCC)   . Hyperlipidemia   . Hypertension   . Onychomycosis of toenail 07/31/2016  . Sleep apnea       Had surgery to correct    Surgeries: Procedure(s): TOTAL HIP ARTHROPLASTY on 08/22/2018   Consultants (if any):   Discharged Condition: Improved  Hospital Course: April Shaffer is an 68 y.o. female who was admitted 08/22/2018 with a diagnosis of Primary localized osteoarthritis of right hip and went to the operating room on 08/22/2018 and underwent the above named procedures.    She was given perioperative antibiotics:  Anti-infectives (From admission, onward)   Start     Dose/Rate Route Frequency Ordered Stop   08/22/18 1630  ceFAZolin (ANCEF) IVPB 1 g/50 mL premix     1 g 100 mL/hr over 30 Minutes Intravenous Every 6 hours 08/22/18 1543 08/22/18 2245   08/22/18 0800  ceFAZolin (ANCEF) IVPB 2g/100 mL premix     2 g 200 mL/hr over 30 Minutes Intravenous On call to O.R. 08/22/18 0752 08/22/18 1027    .  She was given sequential compression devices, early ambulation, and aspirin for DVT prophylaxis.  She benefited maximally from the hospital stay and there were no complications.    Recent vital signs:  Vitals:   08/23/18 0109 08/23/18 0449  BP: (!) 141/81 136/66  Pulse: 76 81  Resp: 14 14  Temp: 98.2 F (36.8 C) 98.6 F (37 C)  SpO2: 100% 100%     Recent laboratory studies:  Lab Results  Component Value Date   HGB 10.0 (L) 08/23/2018   HGB 12.8 08/13/2018   HGB 13.3 05/09/2018   Lab Results  Component Value Date   WBC 10.2 08/23/2018   PLT 176 08/23/2018   Lab Results  Component Value Date   INR 0.9 05/09/2018   Lab Results  Component Value Date   NA 137 08/23/2018   K 3.4 (L) 08/23/2018   CL 103 08/23/2018   CO2 25 08/23/2018   BUN 9 08/23/2018   CREATININE 0.62 08/23/2018   GLUCOSE 160 (H) 08/23/2018    Discharge Medications:   Allergies as of 08/23/2018      Reactions   Lisinopril-hydrochlorothiazide Other (See Comments)   Angioedema  - Tongue swelling    Codeine Itching   Oxycodone Itching   Simvastatin Other (See Comments)   Muscle pain   Tramadol Itching      Medication List    TAKE these medications   acetaminophen 650 MG CR tablet Commonly known as: TYLENOL Take 1,300 mg by mouth daily.   allopurinol 300 MG tablet Commonly known as: ZYLOPRIM Take 1 tablet by mouth once daily   amLODipine 10 MG tablet Commonly known as: NORVASC Take 1 tablet by mouth once daily   aspirin EC 81 MG tablet Take 1 tablet (81 mg total) by mouth 2 (two) times daily for 14 days. What changed: when   to take this   atorvastatin 80 MG tablet Commonly known as: LIPITOR TAKE 1 TABLET BY MOUTH ONCE DAILY WITH  EVENING  MEAL What changed:   how much to take  how to take this  when to take this  additional instructions   blood glucose meter kit and supplies Kit Check sugars twice daily   carvedilol 20 MG 24 hr capsule Commonly known as: COREG CR Take 1 capsule by mouth once daily   clopidogrel 75 MG tablet Commonly known as: PLAVIX Take 1 tablet by mouth once daily with breakfast   Colcrys 0.6 MG tablet Generic drug: colchicine 1 by mouth twice a day as needed for gout attacks What changed:   how much to take  how to take this  when to take this  reasons to take this  additional  instructions   cyclobenzaprine 5 MG tablet Commonly known as: FLEXERIL Take 1 tablet (5 mg total) by mouth 3 (three) times daily as needed for muscle spasms.   ezetimibe 10 MG tablet Commonly known as: ZETIA Take 1 tablet (10 mg total) by mouth daily.   fexofenadine 180 MG tablet Commonly known as: ALLEGRA Take 1 tablet (180 mg total) by mouth daily.   gabapentin 300 MG capsule Commonly known as: NEURONTIN TAKE 1 CAPSULE BY MOUTH THREE TIMES DAILY What changed:   how much to take  how to take this  when to take this  additional instructions   glucose blood test strip Check sugars twice daily   metFORMIN 500 MG 24 hr tablet Commonly known as: GLUCOPHAGE-XR TAKE ONE TABLET BY MOUTH TWICE DAILY WITH MEALS What changed:   how much to take  how to take this  when to take this  additional instructions   omega-3 acid ethyl esters 1 g capsule Commonly known as: LOVAZA Take 1 capsule (1 g total) by mouth 2 (two) times daily.   OVER THE COUNTER MEDICATION Take 1 tablet by mouth daily. Calcium  63m  and vitamin D3   oxyCODONE 5 MG immediate release tablet Commonly known as: Oxy IR/ROXICODONE Take one tab po q4-6hrs prn pain, may need 1-2 first couple weeks   SALONPAS PAIN RELIEF PATCH EX Place 1 patch onto the skin daily as needed.            Durable Medical Equipment  (From admission, onward)         Start     Ordered   08/22/18 1544  DME Walker rolling  Once    Question:  Patient needs a walker to treat with the following condition  Answer:  Primary localized osteoarthritis of right hip   08/22/18 1543   08/22/18 1544  DME 3 n 1  Once     08/22/18 1543          Diagnostic Studies: Dg Pelvis Portable  Result Date: 08/22/2018 CLINICAL DATA:  Right total hip arthroplasty. EXAM: PORTABLE PELVIS 1-2 VIEWS COMPARISON:  Right hip radiographs 11/25/2017. FINDINGS: Interval right total hip arthroplasty. The femoral component is incompletely visualized  on these views. The hardware appears well positioned. No evidence of acute fracture or dislocation. There is a small amount of air within the right hip joint and surrounding soft tissues. Vascular calcifications, left hip degenerative changes and lower lumbar spondylosis are noted. IMPRESSION: No demonstrated complication following right total hip arthroplasty. Electronically Signed   By: WRichardean SaleM.D.   On: 08/22/2018 14:30   Dg Hip Port Unilat With Pelvis 1v Right  Result Date: 08/22/2018 CLINICAL DATA:  Right total hip arthroplasty. EXAM: DG HIP (WITH OR WITHOUT PELVIS) 1V PORT RIGHT COMPARISON:  Radiographs 11/25/2017. FINDINGS: Interval right total hip arthroplasty. The hardware is well positioned. No evidence of acute fracture or dislocation. A small amount of gas is present within the soft tissue surrounding right hip. There are scattered vascular calcifications. IMPRESSION: No demonstrated complication following right total hip arthroplasty. Electronically Signed   By: William  Veazey M.D.   On: 08/22/2018 14:30    Disposition:     Follow-up Information    Caffrey, Daniel, MD. Schedule an appointment as soon as possible for a visit in 2 weeks.   Specialty: Orthopedic Surgery Contact information: [address] [place] [address] [phone]            Signed: Henry Calvin Martensen III PA-C 08/23/2018, 8:38 AM    

## 2018-08-23 NOTE — Progress Notes (Signed)
    Home health agencies that serve [phone].        [place] Quality of Patient Care Rating Patient Survey Summary Rating  [facility] [phone] 4 out of 5 stars 4 out of [place] [phone] 3 out of 5 stars 4 out of [place] [phone]) [phone]  out of 5 stars 3 out of [place] [phone] 4  out of 5 stars 4 out of [place] [phone] 4 out of 5 stars 4 out of [place] [phone] 4 out of 5 stars 4 out of 5 stars  [facility] [place] [phone] 3  out of 5 stars 4 out of [place] [phone] 3 out of 5 stars 4 out of 5 stars  [facility] [phone] 4 out of 5 stars Not [place] [phone] 3 out of 5 stars 4 out of 5 stars  [facility] [phone] 3  out of 5 stars 3 out of 5 [place] [phone] 5 out of 5 stars 4 out of [place] [phone] 3  out of 5 stars 4 out of [place] [phone] 3  out of 5 stars 3 out of [place] [phone] 3  out of 5 stars Not [place] [phone] 4  out of 5 stars 3 out of [place] number Footnote as displayed on [place]  1 This agency provides services under a federal waiver program to non-traditional, chronic long term population.  2 This agency provides services to a special needs population.  3 Not Available.  4 The number of patient episodes for this measure is too small to report.  5 This measure currently does not have data or provider has been certified/recertified for less than 6 months.  6 The national  average for this measure is not provided because of state-to-state differences in data collection.  7 Medicare is not displaying rates for this measure for any home health agency, because of an issue with the data.  8 There were problems with the data and they are being corrected.  9 Zero, or very few, patients met the survey's rules for inclusion. The scores shown, if any, reflect a very small number of surveys and may not accurately tell how an agency is doing.  10 Survey results are based on less than 12 months of data.  11 Fewer than 70 patients completed the survey. Use the scores shown, if any, with caution as the number of surveys may be too low to accurately tell how an agency is doing.  12 No survey results are available for this period.  13 Data suppressed by CMS for one or more quarters.

## 2018-08-23 NOTE — Plan of Care (Signed)
Pt ready for D\C

## 2018-08-23 NOTE — Progress Notes (Signed)
    Subjective: Patient reports pain as mild to moderate.  Tolerating diet.  Urinating.   No CP, SOB.  Good early mobilization in room.  Objective:   VITALS:   Vitals:   08/22/18 1825 08/22/18 2155 08/23/18 0109 08/23/18 0449  BP: 121/75 (!) 144/69 (!) 141/81 136/66  Pulse: 76 73 76 81  Resp: 16 16 14 14   Temp: 98.1 F (36.7 C) (!) 97.5 F (36.4 C) 98.2 F (36.8 C) 98.6 F (37 C)  TempSrc:  Oral Oral Oral  SpO2: 100% 100% 100% 100%  Weight:      Height:       CBC Latest Ref Rng & Units 08/23/2018 08/13/2018 05/09/2018  WBC 4.0 - 10.5 K/uL 10.2 6.4 8.2  Hemoglobin 12.0 - 15.0 g/dL 10.0(L) 12.8 13.3  Hematocrit 36.0 - 46.0 % 31.6(L) 38.8 42.4  Platelets 150 - 400 K/uL 176 187 280   BMP Latest Ref Rng & Units 08/23/2018 08/13/2018 05/09/2018  Glucose 70 - 99 mg/dL 160(H) 96 113(H)  BUN 8 - 23 mg/dL 9 13 10   Creatinine 0.44 - 1.00 mg/dL 0.62 0.76 0.69  BUN/Creat Ratio 12 - 28 - - -  Sodium 135 - 145 mmol/L 137 140 138  Potassium 3.5 - 5.1 mmol/L 3.4(L) 3.5 3.3(L)  Chloride 98 - 111 mmol/L 103 105 103  CO2 22 - 32 mmol/L 25 26 25   Calcium 8.9 - 10.3 mg/dL 7.8(L) 8.9 9.1   Intake/Output      06/26 0701 - 06/27 0700 06/27 0701 - 06/28 0700   P.O. 240 118   I.V. (mL/kg) 2588.3 (35.9)    IV Piggyback 200    Total Intake(mL/kg) 3028.3 (42.1) 118 (1.6)   Urine (mL/kg/hr) 2525    Emesis/NG output 0    Stool 0    Blood 350    Total Output 2875    Net +153.3 +118        Stool Occurrence 0 x    Emesis Occurrence 0 x       Physical Exam: General: NAD.  Upright in bed.  Calm, conversant. Resp: No increased wob Cardio: regular rate and rhythm ABD soft Neurologically intact MSK RLE: Neurovascularly intact Sensation intact distally Intact pulses distally Dorsiflexion/Plantar flexion intact Incision: dressing C/D/I.  Scant bloody drainage on Aquacel.   Assessment: 1 Day Post-Op  S/P Procedure(s) (LRB): TOTAL HIP ARTHROPLASTY (Right) by Dr. French Ana on 08/22/1998   Active Problems:   Degenerative joint disease of right hip   Primary localized osteoarthritis of right hip   Primary osteoarthritis, status post right total hip arthroplasty Doing well postop day 1 Eating, drinking, and voiding Pain controlled Good early mobilization  Plan: Advance diet Up with therapy D/C IV fluids when tolerating adequate p.o. Incentive Spirometry Apply ice   Weight Bearing: Weight Bearing as Tolerated (WBAT) posterior hip precautions RLE Dressings: Maintain Aquacel.   VTE prophylaxis: Aspirin 81 mg twice daily, SCDs, ambulation  Dispo: Home with home health PT     Prudencio Burly III, PA-C 08/23/2018, 8:31 AM

## 2018-08-23 NOTE — Evaluation (Signed)
Occupational Therapy Evaluation Patient Details Name: April DartingCynthia C Urbach MRN: [id number] DOB: 11/25/1950 Today's Date: 08/23/2018    History of Present Illness s/p Right posterior THA PMHx: CAD, DM   Clinical Impression   This 68 year old female was admitted for the above sx.  She has posterior THPs and plans to have boyfriend assist her.  Practiced tub bench transfer today. Will follow in acute setting to reinforce precautions and for toileting     Follow Up Recommendations  Home health OT    Equipment Recommendations  Tub/shower bench(if her insurance will cover it--she thinks it will)    Recommendations for Other Services       Precautions / Restrictions Precautions Precautions: Posterior Hip Restrictions Weight Bearing Restrictions: No Other Position/Activity Restrictions: WBAT      Mobility Bed Mobility               General bed mobility comments: oob  Transfers Overall transfer level: Needs assistance Equipment used: Rolling walker (2 wheeled) Transfers: Sit to/from Stand Sit to Stand: Min assist         General transfer comment: assist to rise and stabilize, cues for THP and hand placement    Balance                                           ADL either performed or assessed with clinical judgement   ADL Overall ADL's : Needs assistance/impaired Eating/Feeding: Independent   Grooming: Set up   Upper Body Bathing: Set up   Lower Body Bathing: Moderate assistance   Upper Body Dressing : Set up   Lower Body Dressing: Maximal assistance   Toilet Transfer: Min guard;Ambulation Toilet Transfer Details (indicate cue type and reason): simulated, chair     Tub/ Shower Transfer: Minimal assistance;Tub transfer;Tub bench;Rolling walker;Ambulation     General ADL Comments: educated pt on AE vs assistance for ADLs. She is not interested in AE and will have boyfriend assist her.  reviewed posterior precautions in ADL context.  Pt  recalled 0/3 initially, but was sleepy yesterday with PT     Vision         Perception     Praxis      Pertinent Vitals/Pain Pain Assessment: No/denies pain Pain Score: 5  Pain Location: right hip Pain Descriptors / Indicators: Sore Pain Intervention(s): Limited activity within patient's tolerance;Monitored during session;Premedicated before session;Repositioned     Hand Dominance     Extremity/Trunk Assessment Upper Extremity Assessment Upper Extremity Assessment: Overall WFL for tasks assessed           Communication Communication Communication: No difficulties   Cognition Arousal/Alertness: Awake/alert Behavior During Therapy: WFL for tasks assessed/performed Overall Cognitive Status: Within Functional Limits for tasks assessed                                     General Comments       Exercises    Shoulder Instructions      Home Living Family/patient expects to be discharged to:: Private residence Living Arrangements: Spouse/significant other Available Help at Discharge: Family               Bathroom Shower/Tub: Chief Strategy OfficerTub/shower unit   Bathroom Toilet: Standard     Home Equipment: Environmental consultantWalker - 2 wheels;Bedside commode  Prior Functioning/Environment Level of Independence: Independent                 OT Problem List: Decreased knowledge of precautions;Decreased knowledge of use of DME or AE;Pain      OT Treatment/Interventions: Self-care/ADL training;DME and/or AE instruction;Patient/family education    OT Goals(Current goals can be found in the care plan section) Acute Rehab OT Goals Patient Stated Goal: less pain; return to independence OT Goal Formulation: With patient Time For Goal Achievement: 08/30/18 Potential to Achieve Goals: Good ADL Goals Pt Will Transfer to Toilet: with supervision;bedside commode;ambulating Pt Will Perform Toileting - Clothing Manipulation and hygiene: with supervision;sit to/from  stand Additional ADL Goal #1: pt will verbalize 3/3 posterior THPs and verbalize what assistance she needs for adls (without AE)  OT Frequency: Min 2X/week   Barriers to D/C:            Co-evaluation              AM-PAC OT "6 Clicks" Daily Activity     Outcome Measure Help from another person eating meals?: None Help from another person taking care of personal grooming?: A Little Help from another person toileting, which includes using toliet, bedpan, or urinal?: A Little Help from another person bathing (including washing, rinsing, drying)?: A Lot Help from another person to put on and taking off regular upper body clothing?: A Little Help from another person to put on and taking off regular lower body clothing?: A Lot 6 Click Score: 17   End of Session    Activity Tolerance: Patient tolerated treatment well Patient left: in chair;with call bell/phone within reach  OT Visit Diagnosis: Pain Pain - Right/Left: Right Pain - part of body: Hip                Time: 6270-3500 OT Time Calculation (min): 35 min Charges:  OT General Charges $OT Visit: 1 Visit OT Evaluation $OT Eval Low Complexity: 1 Low OT Treatments $Self Care/Home Management : 8-22 mins  Lesle Chris, OTR/L Acute Rehabilitation Services [phone] [facility] pager [phone] office 08/23/2018  [place] 08/23/2018, 12:46 PM

## 2018-08-25 ENCOUNTER — Encounter (HOSPITAL_COMMUNITY): Payer: Self-pay | Admitting: Orthopedic Surgery

## 2018-09-16 ENCOUNTER — Ambulatory Visit: Payer: Medicare HMO | Attending: Orthopedic Surgery | Admitting: Physical Therapy

## 2018-09-16 ENCOUNTER — Other Ambulatory Visit: Payer: Self-pay

## 2018-09-16 DIAGNOSIS — Z96641 Presence of right artificial hip joint: Secondary | ICD-10-CM | POA: Diagnosis present

## 2018-09-16 DIAGNOSIS — M6281 Muscle weakness (generalized): Secondary | ICD-10-CM | POA: Diagnosis present

## 2018-09-16 DIAGNOSIS — M25651 Stiffness of right hip, not elsewhere classified: Secondary | ICD-10-CM | POA: Diagnosis present

## 2018-09-16 DIAGNOSIS — R293 Abnormal posture: Secondary | ICD-10-CM | POA: Insufficient documentation

## 2018-09-16 DIAGNOSIS — M25551 Pain in right hip: Secondary | ICD-10-CM | POA: Diagnosis present

## 2018-09-16 NOTE — Addendum Note (Signed)
Addended by: Raeford Razor L on: 09/16/2018 12:27 PM   Modules accepted: Orders

## 2018-09-16 NOTE — Therapy (Signed)
[place], [place], [address] Phone: [phone]   Fax:  [phone]  Physical Therapy Evaluation  Patient Details  Name: April Shaffer MRN: [id number] Date of Birth: 04-08-1950 Referring Provider (PT): Dr. Maxwell Marion    Encounter Date: 09/16/2018  PT End of Session - 09/16/18 1211    Visit Number  1    Number of Visits  12    Date for PT Re-Evaluation  10/28/18    PT Start Time  1103    PT Stop Time  1150    PT Time Calculation (min)  47 min    Activity Tolerance  Patient tolerated treatment well    Behavior During Therapy  [facility] for tasks assessed/performed       Past Medical History:  Diagnosis Date  . Arthritis   . Carotid artery narrowing   . Coronary artery disease    Cardiac catheterization November 2013: 50% ostial LAD stenosis 50% mid stenosis. 30% disease in the left circumflex.  . Diabetes mellitus, type 2 ([place])   . Hyperlipidemia   . Hypertension   . Onychomycosis of toenail 07/31/2016  . Sleep apnea       Had surgery to correct    Past Surgical History:  Procedure Laterality Date  . ABDOMINAL HYSTERECTOMY    . CARDIAC CATHETERIZATION    . ENDARTERECTOMY  09/27/2011   Procedure: RIGT ENDARTERECTOMY CAROTID;  Surgeon: Mal Misty, MD;  Location: [place];  Service: Vascular;  Laterality: Right;  . EPIDURAL BLOCK INJECTION  02/2008   Drs. Eulis Manly  . gyn surgery  2004   total hysterectomy for mennorhagia,,salpingoophorectomy  . TONSILLECTOMY    . TOTAL ABDOMINAL HYSTERECTOMY W/ BILATERAL SALPINGOOPHORECTOMY Bilateral 2000  . TOTAL HIP ARTHROPLASTY Right 08/22/2018   Procedure: TOTAL HIP ARTHROPLASTY;  Surgeon: Earlie Server, MD;  Location: [facility];  Service: Orthopedics;  Laterality: Right;  Marland Kitchen VARICOSE VEIN SURGERY  2008   stripping    There were no vitals filed for this visit.   Subjective Assessment - 09/16/18 1110    Subjective  Patient underwent Rt THA on 08/22/18  by Dr. French Ana with no complications.  She had 3 weeks of HHPT.  She has difficulty walking without an assistive device.  She has pain in her R heel,Rt hip and it interferes with ADLs, cooking.    Pertinent History  CAD, HTN, hyperlipidemia, neck pain/shoulder pain with PT    Limitations  Lifting;Standing;Walking;House hold activities    How long can you walk comfortably?  10-15 min gets tired    Diagnostic tests  Pt would like to improve    Patient Stated Goals  Pt would like walk without a device.    Currently in Pain?  Yes    Pain Score  8     Pain Location  Hip    Pain Orientation  Right    Pain Descriptors / Indicators  Aching;Sore    Pain Type  Surgical pain    Pain Radiating Towards  buttock    Pain Onset  More than a month ago    Pain Frequency  Intermittent    Aggravating Factors   standing and walking    Pain Relieving Factors  ice pack, rest    Effect of Pain on Daily Activities  limits mobility    Multiple Pain Sites  No         [facility] PT Assessment - 09/16/18 0001      Assessment  Medical Diagnosis  R THA     Referring Provider (PT)  Dr. Leslye Peerafffey     Onset Date/Surgical Date  08/22/18    Next MD Visit  early Aug 2020    Prior Therapy  Not for hip       Precautions   Precautions  Posterior Hip    Precaution Comments  knows and abides       Balance Screen   Has the patient fallen in the past 6 months  Yes    How many times?  1   prior to surgery fell whicle taking out the trash    Has the patient had a decrease in activity level because of a fear of falling?   Yes    Is the patient reluctant to leave their home because of a fear of falling?   No      Home Environment   Living Environment  Private residence    Living Arrangements  Spouse/significant other    Type of Home  House    Home Access  Stairs to enter    Entrance Stairs-Number of Steps  1    Entrance Stairs-Rails  None    Home Layout  One level    Home Equipment  Walker - 2 wheels;Gilmer MorCane - single  point      Prior Function   Level of Independence  Independent with household mobility with device;Independent with community mobility with device;Needs assistance with ADLs    Vocation  Retired    DealerVocation Requirements  [employer]     Leisure  sitter for elderly man, family time      Cognition   Overall Cognitive Status  Within Functional Limits for tasks assessed      Observation/Other Assessments   Focus on Therapeutic Outcomes (FOTO)   62%      Sensation   Light Touch  Appears Intact      Functional Tests   Functional tests  Single leg stance      Single Leg Stance   Comments  limited bilateral       Posture/Postural Control   Posture/Postural Control  Postural limitations    Postural Limitations  Increased thoracic kyphosis;Flexed trunk    Posture Comments  hips flexed , Rt leg longer than L in supine       PROM   Overall PROM Comments  Rt hip to 90 deg, ER about 45 -50 deg       Strength   Right Hip Flexion  4/5    Left Hip Flexion  4+/5    Right Knee Flexion  4+/5    Right Knee Extension  4+/5    Left Knee Flexion  4+/5    Left Knee Extension  4+/5      Palpation   Palpation comment  min tenderness in Rt hip       Bed Mobility   Bed Mobility  --   I will supine to sit      Transfers   Five time sit to stand comments   18 sec hands on thighs       Ambulation/Gait   Ambulation/Gait  Yes    Ambulation/Gait Assistance  6: Modified independent (Device/Increase time)    Ambulation Distance (Feet)  100 Feet    Assistive device  Straight cane    Gait Pattern  Step-through pattern;Decreased step length - left;Decreased stance time - right;Antalgic    Ambulation Surface  Level;Indoor    Gait Comments  improved gait pattern  with rolling walker                Objective measurements completed on examination: See above findings.      [facility]/Exercise - 09/16/18 0001      Self-Care   Self-Care  Posture;Heat/Ice Application;Other  Self-Care Comments    Posture  hip extension    gait    Other Self-Care Comments   hip precautions              PT Education - 09/16/18 1211    Education Details  PT/POC, leg length, bring in HHPT HEP, gait    Person(s) Educated  Patient    Methods  Explanation;Verbal cues    Comprehension  Verbalized understanding;Returned demonstration          PT Long Term Goals - 09/16/18 1212      PT LONG TERM GOAL #1   Title  Pt will be able to improve FOTO score to less than 40% to show greater functional mobility    Time  6    Period  Weeks    Status  New    Target Date  10/28/18      PT LONG TERM GOAL #2   Title  Pt will be able to walk without cane and min gait deviation most of the time, pain controlled    Time  6    Period  Weeks    Status  New    Target Date  10/28/18      PT LONG TERM GOAL #3   Title  Pt will be I with HEP upon Discharge to maintain functional gains    Time  6    Period  Weeks    Status  New    Target Date  10/28/18      PT LONG TERM GOAL #4   Title  Pt will be able to stand, do home tasks for 30 min at a time with pain controlled <3/10, with or without cane    Time  6    Period  Weeks    Status  New    Target Date  10/28/18             Plan - 09/16/18 1216    Clinical Impression Statement  Patient presents for low complexity eval of Rt total hip arthroscopy done on 08/22/18.  Overall she is doing well, pain is well controlled with medication and self limiting activities.  She has the help she needs from her boyfriend, only needs occasional help with lower body dressing.  She tires easily and can be up for about 10-15 min at a time.  May need a heel lift in her L shoe due to leg length discrepancy.    Personal Factors and Comorbidities  Comorbidity 1;Comorbidity 2    Comorbidities  HTN, Diabetes    Examination-Activity Limitations  Bathing;Hygiene/Grooming;Squat;Stairs;Lift;Locomotion Level;Stand;Dressing    Examination-Participation  Restrictions  Church;Laundry;Shop;Cleaning;Community Activity;Meal Prep    Stability/Clinical Decision Making  Stable/Uncomplicated    Clinical Decision Making  Low    Rehab Potential  Excellent    PT Frequency  2x / week    PT Duration  6 weeks    PT Treatment/Interventions  ADLs/Self Care Home Management;Therapeutic activities;Patient/family education;Taping;Therapeutic exercise;Cryotherapy;Functional mobility training;Neuromuscular re-education;Gait training;Stair training;Moist Heat;Balance training;Passive range of motion;Manual techniques    PT Next Visit Plan  to bring in HEP from HHPT, advance as necessary. gait.  post hip precautions    PT Home Exercise Plan  HHPT    Consulted and Agree with Plan of Care  Patient       Patient will benefit from skilled therapeutic intervention in order to improve the following deficits and impairments:  Abnormal gait, Decreased balance, Difficulty walking, Decreased strength, Decreased range of motion, Decreased endurance, Decreased activity tolerance, Postural dysfunction, Pain, Increased fascial restricitons, Decreased mobility  Visit Diagnosis: 1. Abnormal posture   2. Pain in right hip   3. History of total hip arthroplasty, right   4. Muscle weakness (generalized)   5. Stiffness of right hip, not elsewhere classified        Problem List Patient Active Problem List   Diagnosis Date Noted  . Primary localized osteoarthritis of right hip 08/22/2018  . Carpal tunnel syndrome, bilateral 08/06/2018  . Type 2 diabetes mellitus with peripheral neuropathy (HCC) 08/06/2018  . Degenerative joint disease of right hip 08/06/2018  . Preoperative clearance 03/26/2018  . Upper back pain on left side 10/30/2016  . Encounter for postoperative carotid endarterectomy surveillance 12/07/2015  . Varicosities of leg 06/03/2015  . Acute left lumbar radiculopathy 04/15/2014  . Chronic sciatica of right side 03/17/2013  . Diabetic peripheral neuropathy  (HCC) 03/17/2013  . Coronary artery disease   . Carotid artery stenosis s/p CEA 2013 10/16/2011  . Carotid artery occlusion without infarction, right 09/17/2011  . Carotid bruit 08/28/2011  . Gout 04/13/2011  . DM type 2 with diabetic peripheral neuropathy (HCC) 04/02/2008  . Hyperlipidemia associated with type 2 diabetes mellitus (HCC) 04/02/2008  . Essential hypertension 04/02/2008  . DEGENERATIVE DISC DISEASE, LUMBOSACRAL SPINE 04/02/2008    Aubriauna Riner 09/16/2018, 12:25 PM  [facility] [address] [place], [place], [address] Phone: [phone]   Fax:  [phone]  Name: April Shaffer MRN: [id number] Date of Birth: 11/26/1950   Karie MainlandJennifer Kashvi Prevette, PT 09/16/18 12:25 PM Phone: [phone] Fax: [phone]

## 2018-09-29 ENCOUNTER — Encounter: Payer: Self-pay | Admitting: Physical Therapy

## 2018-09-29 ENCOUNTER — Ambulatory Visit: Payer: Medicare HMO | Attending: Orthopedic Surgery | Admitting: Physical Therapy

## 2018-09-29 ENCOUNTER — Other Ambulatory Visit: Payer: Self-pay

## 2018-09-29 DIAGNOSIS — Z96641 Presence of right artificial hip joint: Secondary | ICD-10-CM | POA: Diagnosis present

## 2018-09-29 DIAGNOSIS — M25651 Stiffness of right hip, not elsewhere classified: Secondary | ICD-10-CM | POA: Diagnosis present

## 2018-09-29 DIAGNOSIS — R293 Abnormal posture: Secondary | ICD-10-CM | POA: Diagnosis not present

## 2018-09-29 DIAGNOSIS — M6281 Muscle weakness (generalized): Secondary | ICD-10-CM

## 2018-09-29 DIAGNOSIS — M25551 Pain in right hip: Secondary | ICD-10-CM | POA: Diagnosis present

## 2018-09-29 NOTE — Therapy (Signed)
[facility] [address] [place], [place], [address] Phone: [phone]   Fax:  [phone]  Physical Therapy Treatment  Patient Details  Name: April DartingCynthia C Gambone MRN: [id number] Date of Birth: 07/26/1950 Referring Provider (PT): Dr. Leslye Peerafffey    Encounter Date: 09/29/2018  PT End of Session - 09/29/18 1102    Visit Number  2    Number of Visits  12    PT Start Time  1101    PT Stop Time  1145    PT Time Calculation (min)  44 min       Past Medical History:  Diagnosis Date  . Arthritis   . Carotid artery narrowing   . Coronary artery disease    Cardiac catheterization November 2013: 50% ostial LAD stenosis 50% mid stenosis. 30% disease in the left circumflex.  . Diabetes mellitus, type 2 (HCC)   . Hyperlipidemia   . Hypertension   . Onychomycosis of toenail 07/31/2016  . Sleep apnea       Had surgery to correct    Past Surgical History:  Procedure Laterality Date  . ABDOMINAL HYSTERECTOMY    . CARDIAC CATHETERIZATION    . ENDARTERECTOMY  09/27/2011   Procedure: RIGT ENDARTERECTOMY CAROTID;  Surgeon: Pryor OchoaJames D Lawson, MD;  [facility];  Service: Vascular;  Laterality: Right;  . EPIDURAL BLOCK INJECTION  02/2008   Drs. Dorthula NettlesBlackwell, Neudelman  . gyn surgery  2004   total hysterectomy for mennorhagia,,salpingoophorectomy  . TONSILLECTOMY    . TOTAL ABDOMINAL HYSTERECTOMY W/ BILATERAL SALPINGOOPHORECTOMY Bilateral 2000  . TOTAL HIP ARTHROPLASTY Right 08/22/2018   Procedure: TOTAL HIP ARTHROPLASTY;  Surgeon: Frederico Hammanaffrey, Daniel, MD;  Location[facility];  Service: Orthopedics;  Laterality: Right;  Marland Kitchen. VARICOSE VEIN SURGERY  2008   stripping    There were no vitals filed for this visit.                    [facility] Adult PT Treatment/Exercise - 09/29/18 0001      Self-Care   Other Self-Care Comments   Use of massage roller / rolling pin to self massage right thigh,       Knee/Hip Exercises: Aerobic   Nustep  small ROM  below 90 degrees right hip flexion x 5 minutes       Knee/Hip Exercises: Standing   Heel Raises  10 reps    Knee Flexion  Right;Left;20 reps    Hip Flexion Limitations  marching and unilateral x 10 each , 1 UE support    Abduction Limitations  side stepping with and without UE     Hip Extension  10 reps    Extension Limitations  2 sets each     Functional Squat Limitations  squat at RW , avoiding >90 hip flexion- cues for hip hinge     SLS  1 UE support 20 sec x 2 each       Knee/Hip Exercises: Supine   Bridges  10 reps    Straight Leg Raises  10 reps    Other Supine Knee/Hip Exercises  ball squeeze x 15       Knee/Hip Exercises: Sidelying   Hip ABduction  10 reps                  PT Long Term Goals - 09/16/18 1212      PT LONG TERM GOAL #1   Title  Pt will be able to improve FOTO score to less than  40% to show greater functional mobility    Time  6    Period  Weeks    Status  New    Target Date  10/28/18      PT LONG TERM GOAL #2   Title  Pt will be able to walk without cane and min gait deviation most of the time, pain controlled    Time  6    Period  Weeks    Status  New    Target Date  10/28/18      PT LONG TERM GOAL #3   Title  Pt will be I with HEP upon Discharge to maintain functional gains    Time  6    Period  Weeks    Status  New    Target Date  10/28/18      PT LONG TERM GOAL #4   Title  Pt will be able to stand, do home tasks for 30 min at a time with pain controlled <3/10, with or without cane    Time  6    Period  Weeks    Status  New    Target Date  10/28/18            Plan - 09/29/18 1159    Clinical Impression Statement  Pt arrives with RW and flexed posture, RW adjusted to correct height. She brought her HHPT HEP so time spent reviewing with some progression to supine and Side lying SLR which she easily performed 10 reps each without difficulty or quad lag. Some difficulty/ pain with WB on right for let hip march.    PT Next  Visit Plan  to bring in HEP from HHPT, advance as necessary. gait.  post hip precautions; add step ups    PT Home Exercise Plan  HHPT (saq, LAQ, standing 3 way hip, SLS with UE support, standing knee flexion, heel slides to 90, SAQ)       Patient will benefit from skilled therapeutic intervention in order to improve the following deficits and impairments:  Abnormal gait, Decreased balance, Difficulty walking, Decreased strength, Decreased range of motion, Decreased endurance, Decreased activity tolerance, Postural dysfunction, Pain, Increased fascial restricitons, Decreased mobility  Visit Diagnosis: 1. Abnormal posture   2. Pain in right hip   3. History of total hip arthroplasty, right   4. Muscle weakness (generalized)   5. Stiffness of right hip, not elsewhere classified        Problem List Patient Active Problem List   Diagnosis Date Noted  . Primary localized osteoarthritis of right hip 08/22/2018  . Carpal tunnel syndrome, bilateral 08/06/2018  . Type 2 diabetes mellitus with peripheral neuropathy (HCC) 08/06/2018  . Degenerative joint disease of right hip 08/06/2018  . Preoperative clearance 03/26/2018  . Upper back pain on left side 10/30/2016  . Encounter for postoperative carotid endarterectomy surveillance 12/07/2015  . Varicosities of leg 06/03/2015  . Acute left lumbar radiculopathy 04/15/2014  . Chronic sciatica of right side 03/17/2013  . Diabetic peripheral neuropathy (HCC) 03/17/2013  . Coronary artery disease   . Carotid artery stenosis s/p CEA 2013 10/16/2011  . Carotid artery occlusion without infarction, right 09/17/2011  . Carotid bruit 08/28/2011  . Gout 04/13/2011  . DM type 2 with diabetic peripheral neuropathy (HCC) 04/02/2008  . Hyperlipidemia associated with type 2 diabetes mellitus (HCC) 04/02/2008  . Essential hypertension 04/02/2008  . DEGENERATIVE DISC DISEASE, LUMBOSACRAL SPINE 04/02/2008    Sherrie Mustacheonoho, Aurielle Slingerland McGee , PTA  09/29/2018, 12:27  PM  [place], [place], [address] Phone: [phone]   Fax:  [phone]  Name: PHILLIS THACKERAY MRN: [id number] Date of Birth: Mar 14, 1950

## 2018-10-01 ENCOUNTER — Ambulatory Visit: Payer: Medicare HMO | Admitting: Physical Therapy

## 2018-10-01 ENCOUNTER — Other Ambulatory Visit: Payer: Self-pay

## 2018-10-01 ENCOUNTER — Encounter: Payer: Self-pay | Admitting: Physical Therapy

## 2018-10-01 DIAGNOSIS — R293 Abnormal posture: Secondary | ICD-10-CM | POA: Diagnosis not present

## 2018-10-01 DIAGNOSIS — M25551 Pain in right hip: Secondary | ICD-10-CM

## 2018-10-01 DIAGNOSIS — M25651 Stiffness of right hip, not elsewhere classified: Secondary | ICD-10-CM

## 2018-10-01 DIAGNOSIS — Z96641 Presence of right artificial hip joint: Secondary | ICD-10-CM

## 2018-10-01 DIAGNOSIS — M6281 Muscle weakness (generalized): Secondary | ICD-10-CM

## 2018-10-01 NOTE — Therapy (Signed)
[place], [place], [address] Phone: [phone]   Fax:  [phone]  Physical Therapy Treatment  Patient Details  Name: April Shaffer MRN: [id number] Date of Birth: January 19, 1951 Referring Provider (PT): Dr. Maxwell Marion    Encounter Date: 10/01/2018  PT End of Session - 10/01/18 1059    Visit Number  3    Number of Visits  12    Date for PT Re-Evaluation  10/28/18    PT Start Time  1054   late arr   PT Stop Time  1130    PT Time Calculation (min)  36 min    Activity Tolerance  Patient tolerated treatment well    Behavior During Therapy  [facility] for tasks assessed/performed       Past Medical History:  Diagnosis Date  . Arthritis   . Carotid artery narrowing   . Coronary artery disease    Cardiac catheterization November 2013: 50% ostial LAD stenosis 50% mid stenosis. 30% disease in the left circumflex.  . Diabetes mellitus, type 2 ([place])   . Hyperlipidemia   . Hypertension   . Onychomycosis of toenail 07/31/2016  . Sleep apnea       Had surgery to correct    Past Surgical History:  Procedure Laterality Date  . ABDOMINAL HYSTERECTOMY    . CARDIAC CATHETERIZATION    . ENDARTERECTOMY  09/27/2011   Procedure: RIGT ENDARTERECTOMY CAROTID;  Surgeon: Mal Misty, MD;  Location: [place];  Service: Vascular;  Laterality: Right;  . EPIDURAL BLOCK INJECTION  02/2008   Drs. Eulis Manly  . gyn surgery  2004   total hysterectomy for mennorhagia,,salpingoophorectomy  . TONSILLECTOMY    . TOTAL ABDOMINAL HYSTERECTOMY W/ BILATERAL SALPINGOOPHORECTOMY Bilateral 2000  . TOTAL HIP ARTHROPLASTY Right 08/22/2018   Procedure: TOTAL HIP ARTHROPLASTY;  Surgeon: Earlie Server, MD;  [facility];  Service: Orthopedics;  Laterality: Right;  Marland Kitchen VARICOSE VEIN SURGERY  2008   stripping    There were no vitals filed for this visit.  Subjective Assessment - 10/01/18 1058    Subjective  Pain is 5/10 today Rt lateral  hip, tingly.    Currently in Pain?  Yes    Pain Score  5     Pain Location  Hip    Pain Orientation  Right    Pain Descriptors / Indicators  Tingling    Pain Type  Surgical pain    Pain Onset  More than a month ago    Pain Frequency  Intermittent          [facility] Adult PT Treatment/Exercise - 10/01/18 0001      Knee/Hip Exercises: Aerobic   Nustep  7 min L4 UE and LE       Knee/Hip Exercises: Supine   Quad Sets Limitations  Glute set x 10     Bridges with [employer]  Strengthening;Both;2 sets;10 reps    Knee Extension Limitations  hip /knee extension (decompression ) x 10     Other Supine Knee/Hip Exercises  clam x 15 green band done bilateral and unilateral x 10       Knee/Hip Exercises: Sidelying   Hip ABduction  10 reps    Clams  x 20      Knee/Hip Exercises: Prone   Hamstring Curl  1 set;15 reps    Hip Extension  Strengthening;Right;2 sets;10 reps          PT Education - 10/01/18 1126  Education Details  HEP, hip strength    Person(s) Educated  Patient    Methods  Explanation    Comprehension  Verbalized understanding;Returned demonstration          PT Long Term Goals - 09/16/18 1212      PT LONG TERM GOAL #1   Title  Pt will be able to improve FOTO score to less than 40% to show greater functional mobility    Time  6    Period  Weeks    Status  New    Target Date  10/28/18      PT LONG TERM GOAL #2   Title  Pt will be able to walk without cane and min gait deviation most of the time, pain controlled    Time  6    Period  Weeks    Status  New    Target Date  10/28/18      PT LONG TERM GOAL #3   Title  Pt will be I with HEP upon Discharge to maintain functional gains    Time  6    Period  Weeks    Status  New    Target Date  10/28/18      PT LONG TERM GOAL #4   Title  Pt will be able to stand, do home tasks for 30 min at a time with pain controlled <3/10, with or without cane    Time  6    Period  Weeks    Status  New    Target Date   10/28/18            Plan - 10/01/18 1222    Clinical Impression Statement  Patient reports difficulty with bending due to precautions only.  She lacks hip strength particularly in abduction, External rotation.  She was able to get into prone which surprised her.  Continues to benefit from strengthening to promote smooth and upright gait.    PT Treatment/Interventions  ADLs/Self Care Home Management;Therapeutic activities;Patient/family education;Taping;Therapeutic exercise;Cryotherapy;Functional mobility training;Neuromuscular re-education;Gait training;Stair training;Moist Heat;Balance training;Passive range of motion;Manual techniques    PT Next Visit Plan  check lateral hip HEP, gait.  post hip precautions; add step ups    PT Home Exercise Plan  HHPT (saq, LAQ, standing 3 way hip, SLS with UE support, standing knee flexion, heel slides to 90, SAQ, clam with band, in supine, SL clam    Consulted and Agree with Plan of Care  Patient       Patient will benefit from skilled therapeutic intervention in order to improve the following deficits and impairments:  Abnormal gait, Decreased balance, Difficulty walking, Decreased strength, Decreased range of motion, Decreased endurance, Decreased activity tolerance, Postural dysfunction, Pain, Increased fascial restricitons, Decreased mobility  Visit Diagnosis: 1. Abnormal posture   2. Pain in right hip   3. History of total hip arthroplasty, right   4. Muscle weakness (generalized)   5. Stiffness of right hip, not elsewhere classified        Problem List Patient Active Problem List   Diagnosis Date Noted  . Primary localized osteoarthritis of right hip 08/22/2018  . Carpal tunnel syndrome, bilateral 08/06/2018  . Type 2 diabetes mellitus with peripheral neuropathy (HCC) 08/06/2018  . Degenerative joint disease of right hip 08/06/2018  . Preoperative clearance 03/26/2018  . Upper back pain on left side 10/30/2016  . Encounter for  postoperative carotid endarterectomy surveillance 12/07/2015  . Varicosities of leg 06/03/2015  . Acute left lumbar radiculopathy  04/15/2014  . Chronic sciatica of right side 03/17/2013  . Diabetic peripheral neuropathy (HCC) 03/17/2013  . Coronary artery disease   . Carotid artery stenosis s/p CEA 2013 10/16/2011  . Carotid artery occlusion without infarction, right 09/17/2011  . Carotid bruit 08/28/2011  . Gout 04/13/2011  . DM type 2 with diabetic peripheral neuropathy (HCC) 04/02/2008  . Hyperlipidemia associated with type 2 diabetes mellitus (HCC) 04/02/2008  . Essential hypertension 04/02/2008  . DEGENERATIVE DISC DISEASE, LUMBOSACRAL SPINE 04/02/2008    , 10/01/2018, 12:31 PM  [facility] [address] [place], [place], [address] Phone: [phone]   Fax:  [phone]  Name: April Shaffer MRN: [id number] Date of Birth: 10/21/1950  Karie MainlandJennifer , PT 10/01/18 12:32 PM Phone: [phone] Fax: [phone]

## 2018-10-01 NOTE — Patient Instructions (Signed)
Step 1  Step 2  Clamshell reps: 10  sets: 2  hold: 30  daily: 2  weekly: 7 Setup  Begin lying on your side with your knees bent and your hips and shoulders stacked. Movement  Engage your abdominals and raise your top knee up toward the ceiling, then slowly return to the starting position and repeat.  Tip  Make sure to keep your core engaged and do not roll your hips forward or backward during the exercise. Step 1  Step 2  Bent Knee Fallouts reps: 10  sets: 2  hold: 30  daily: 2  weekly: 7 TIE BAND AROUND THIGHS (OPTIONAL)   Setup  Begin lying on your back with your knees bent and feet resting on the floor. Movement  Engage your abdominals and slowly lower one knee towards the ground. Return to the starting position and repeat with the other leg. Tip  Make sure to breathe and do not allow your hips or trunk to rotate during the exercise.

## 2018-10-03 ENCOUNTER — Other Ambulatory Visit: Payer: Self-pay | Admitting: Internal Medicine

## 2018-10-03 DIAGNOSIS — E1142 Type 2 diabetes mellitus with diabetic polyneuropathy: Secondary | ICD-10-CM

## 2018-10-07 ENCOUNTER — Other Ambulatory Visit: Payer: Self-pay

## 2018-10-07 ENCOUNTER — Encounter: Payer: Self-pay | Admitting: Physical Therapy

## 2018-10-07 ENCOUNTER — Ambulatory Visit: Payer: Medicare HMO | Admitting: Physical Therapy

## 2018-10-07 DIAGNOSIS — R293 Abnormal posture: Secondary | ICD-10-CM | POA: Diagnosis not present

## 2018-10-07 DIAGNOSIS — M25651 Stiffness of right hip, not elsewhere classified: Secondary | ICD-10-CM

## 2018-10-07 DIAGNOSIS — Z96641 Presence of right artificial hip joint: Secondary | ICD-10-CM

## 2018-10-07 DIAGNOSIS — M25551 Pain in right hip: Secondary | ICD-10-CM

## 2018-10-07 DIAGNOSIS — M6281 Muscle weakness (generalized): Secondary | ICD-10-CM

## 2018-10-07 NOTE — Therapy (Signed)
[facility] [address] [place], [place], [address] Phone: [phone]   Fax:  [phone]  Physical Therapy Treatment  Patient Details  Name: April Shaffer MRN: [id number] Date of Birth: 05/25/1950 Referring Provider (PT): Dr. Leslye Peerafffey    Encounter Date: 10/07/2018  PT End of Session - 10/07/18 1106    Visit Number  4    Number of Visits  12    Date for PT Re-Evaluation  10/28/18    PT Start Time  1046    PT Stop Time  1131    PT Time Calculation (min)  45 min    Equipment Utilized During Treatment  Gait belt    Activity Tolerance  Patient tolerated treatment well    Behavior During Therapy  [facility] for tasks assessed/performed       Past Medical History:  Diagnosis Date  . Arthritis   . Carotid artery narrowing   . Coronary artery disease    Cardiac catheterization November 2013: 50% ostial LAD stenosis 50% mid stenosis. 30% disease in the left circumflex.  . Diabetes mellitus, type 2 (HCC)   . Hyperlipidemia   . Hypertension   . Onychomycosis of toenail 07/31/2016  . Sleep apnea       Had surgery to correct    Past Surgical History:  Procedure Laterality Date  . ABDOMINAL HYSTERECTOMY    . CARDIAC CATHETERIZATION    . ENDARTERECTOMY  09/27/2011   Procedure: RIGT ENDARTERECTOMY CAROTID;  Surgeon: Pryor OchoaJames D Lawson, MD;  [facility];  Service: Vascular;  Laterality: Right;  . EPIDURAL BLOCK INJECTION  02/2008   Drs. Dorthula NettlesBlackwell, Neudelman  . gyn surgery  2004   total hysterectomy for mennorhagia,,salpingoophorectomy  . TONSILLECTOMY    . TOTAL ABDOMINAL HYSTERECTOMY W/ BILATERAL SALPINGOOPHORECTOMY Bilateral 2000  . TOTAL HIP ARTHROPLASTY Right 08/22/2018   Procedure: TOTAL HIP ARTHROPLASTY;  Surgeon: Frederico Hammanaffrey, Daniel, MD;  [facility];  Service: Orthopedics;  Laterality: Right;  Marland Kitchen. VARICOSE VEIN SURGERY  2008   stripping    There were no vitals filed for this visit.  Subjective Assessment - 10/07/18 1102    Subjective  Hurts a little.  Dr Madelon Lipsaffrey released me until Feb 2021.  I use my cane only in the house. Starting to lay on Rt side a bit    Currently in Pain?  Yes    Pain Score  3     Pain Location  Hip    Pain Orientation  Right    Pain Descriptors / Indicators  Sore    Pain Type  Surgical pain    Pain Onset  More than a month ago    Pain Frequency  Intermittent    Aggravating Factors   stand, walk, activity    Pain Relieving Factors  rest, ice         [facility] Adult PT Treatment/Exercise - 10/07/18 0001      Ambulation/Gait   Ambulation/Gait  Yes    Ambulation/Gait Assistance  4: Min guard    Ambulation Distance (Feet)  250 Feet    Assistive device  Straight cane    Gait Pattern  Step-to pattern;Step-through pattern;Decreased stride length;Antalgic    Ambulation Surface  Unlevel;Outdoor;Gravel;Grass    Gait Comments  improved step through with cues and practice      Knee/Hip Exercises: Stretches   Active Hamstring Stretch  Right;3 reps;30 seconds    Active Hamstring Stretch Limitations  L side x 1     Hip Flexor Stretch  Right;1 rep;60 seconds      Knee/Hip Exercises: Standing   Heel Raises  1 set;20 reps    Hip Abduction  Stengthening;Both;2 sets;10 reps    Lateral Step Up  Right;1 set;10 reps;Hand Hold: 2;Step Height: 6"    Forward Step Up  Both;1 set;10 reps;Hand Hold: 2;Step Height: 6"    Functional Squat  1 set;15 reps    Functional Squat Limitations  cues       Knee/Hip Exercises: Supine   Bridges  Strengthening;Both;1 set;10 reps    Bridges with [employer] Squeeze  Strengthening;Both;1 set;10 reps    Straight Leg Raises  Strengthening;Right;1 set;10 reps    Straight Leg Raises Limitations  3 lb     Straight Leg Raise with External Rotation  Strengthening;Right;1 set;10 reps      Knee/Hip Exercises: Sidelying   Hip ABduction  10 reps    Hip ABduction Limitations  2 sets     Clams  x 20      Manual Therapy   Manual therapy comments  IASTM tiger tail Rt thigh light to  med pressure , leg in stretch position off table and in supine              PT Education - 10/07/18 1103    Education Details  gait pattern, walking on outdoor surface, safety    Person(s) Educated  Patient    Methods  Explanation    Comprehension  Verbalized understanding          PT Long Term Goals - 10/07/18 1107      PT LONG TERM GOAL #1   Title  Pt will be able to improve FOTO score to less than 40% to show greater functional mobility    Status  Unable to assess      PT LONG TERM GOAL #2   Title  Pt will be able to walk without cane and min gait deviation most of the time, pain controlled    Baseline  walking with cane today with min pain, uses walker for community mobility    Status  On-going      PT LONG TERM GOAL #3   Title  Pt will be I with HEP upon Discharge to maintain functional gains    Status  On-going      PT LONG TERM GOAL #4   Title  Pt will be able to stand, do home tasks for 30 min at a time with pain controlled <3/10, with or without cane    Baseline  can be up for 30-40 min with pain moderate    Status  On-going            Plan - 10/07/18 1157    Clinical Impression Statement  Pt able to walk outside around the building with occasional min A for uneven surfaces with cane.  No unsafe behaviors.  Stride became more natural as she walked.  Limited hip strength shown with step ups, closed chain exercises and clam.    PT Treatment/Interventions  ADLs/Self Care Home Management;Therapeutic activities;Patient/family education;Taping;Therapeutic exercise;Cryotherapy;Functional mobility training;Neuromuscular re-education;Gait training;Stair training;Moist Heat;Balance training;Passive range of motion;Manual techniques    PT Next Visit Plan  check lateral hip HEP, gait.  post hip precautions; add step ups    PT Home Exercise Plan  HHPT (saq, LAQ, standing 3 way hip, SLS with UE support, standing knee flexion, heel slides to 90, SAQ, clam with band, in  supine, SL clam    Consulted and Agree with Plan  of Care  Patient       Patient will benefit from skilled therapeutic intervention in order to improve the following deficits and impairments:  Abnormal gait, Decreased balance, Difficulty walking, Decreased strength, Decreased range of motion, Decreased endurance, Decreased activity tolerance, Postural dysfunction, Pain, Increased fascial restricitons, Decreased mobility  Visit Diagnosis: 1. Abnormal posture   2. Pain in right hip   3. History of total hip arthroplasty, right   4. Muscle weakness (generalized)   5. Stiffness of right hip, not elsewhere classified        Problem List Patient Active Problem List   Diagnosis Date Noted  . Primary localized osteoarthritis of right hip 08/22/2018  . Carpal tunnel syndrome, bilateral 08/06/2018  . Type 2 diabetes mellitus with peripheral neuropathy ([place]) 08/06/2018  . Degenerative joint disease of right hip 08/06/2018  . Preoperative clearance 03/26/2018  . Upper back pain on left side 10/30/2016  . Encounter for postoperative carotid endarterectomy surveillance 12/07/2015  . Varicosities of leg 06/03/2015  . Acute left lumbar radiculopathy 04/15/2014  . Chronic sciatica of right side 03/17/2013  . Diabetic peripheral neuropathy ([place]) 03/17/2013  . Coronary artery disease   . Carotid artery stenosis s/p CEA 2013 10/16/2011  . Carotid artery occlusion without infarction, right 09/17/2011  . Carotid bruit 08/28/2011  . Gout 04/13/2011  . DM type 2 with diabetic peripheral neuropathy ([place]) 04/02/2008  . Hyperlipidemia associated with type 2 diabetes mellitus ([place]) 04/02/2008  . Essential hypertension 04/02/2008  . [place] DISEASE, LUMBOSACRAL SPINE 04/02/2008    PAA,JENNIFER 10/07/2018, 12:02 PM  [facility] [address] [place], [place], [address] Phone: [phone]   Fax:  [phone]  Name: April Shaffer MRN:  [id number] Date of Birth: September 12, 1950  Raeford Razor, PT 10/07/18 12:02 PM Phone: [phone] Fax: [phone]

## 2018-10-09 ENCOUNTER — Other Ambulatory Visit: Payer: Self-pay

## 2018-10-09 ENCOUNTER — Encounter: Payer: Self-pay | Admitting: Physical Therapy

## 2018-10-09 ENCOUNTER — Ambulatory Visit: Payer: Medicare HMO | Admitting: Physical Therapy

## 2018-10-09 DIAGNOSIS — M6281 Muscle weakness (generalized): Secondary | ICD-10-CM

## 2018-10-09 DIAGNOSIS — Z96641 Presence of right artificial hip joint: Secondary | ICD-10-CM

## 2018-10-09 DIAGNOSIS — R293 Abnormal posture: Secondary | ICD-10-CM

## 2018-10-09 DIAGNOSIS — M25551 Pain in right hip: Secondary | ICD-10-CM

## 2018-10-09 NOTE — Therapy (Signed)
[place], [place], [address] Phone: [phone]   Fax:  [phone]  Physical Therapy Treatment  Patient Details  Name: April Shaffer MRN: [id number] Date of Birth: 11-22-1950 Referring Provider (PT): Dr. Maxwell Marion    Encounter Date: 10/09/2018  PT End of Session - 10/09/18 1232    Visit Number  5    Number of Visits  12    Date for PT Re-Evaluation  10/28/18    PT Start Time  5597    PT Stop Time  1130    PT Time Calculation (min)  45 min    Activity Tolerance  Patient tolerated treatment well    Behavior During Therapy  [facility] for tasks assessed/performed       Past Medical History:  Diagnosis Date  . Arthritis   . Carotid artery narrowing   . Coronary artery disease    Cardiac catheterization November 2013: 50% ostial LAD stenosis 50% mid stenosis. 30% disease in the left circumflex.  . Diabetes mellitus, type 2 ([place])   . Hyperlipidemia   . Hypertension   . Onychomycosis of toenail 07/31/2016  . Sleep apnea       Had surgery to correct    Past Surgical History:  Procedure Laterality Date  . ABDOMINAL HYSTERECTOMY    . CARDIAC CATHETERIZATION    . ENDARTERECTOMY  09/27/2011   Procedure: RIGT ENDARTERECTOMY CAROTID;  Surgeon: Mal Misty, MD;  Location: [place];  Service: Vascular;  Laterality: Right;  . EPIDURAL BLOCK INJECTION  02/2008   Drs. Eulis Manly  . gyn surgery  2004   total hysterectomy for mennorhagia,,salpingoophorectomy  . TONSILLECTOMY    . TOTAL ABDOMINAL HYSTERECTOMY W/ BILATERAL SALPINGOOPHORECTOMY Bilateral 2000  . TOTAL HIP ARTHROPLASTY Right 08/22/2018   Procedure: TOTAL HIP ARTHROPLASTY;  Surgeon: Earlie Server, MD;  [facility];  Service: Orthopedics;  Laterality: Right;  Marland Kitchen VARICOSE VEIN SURGERY  2008   stripping    There were no vitals filed for this visit.  Subjective Assessment - 10/09/18 1052    Subjective  Used so. me ice this am.  Was very sore  after last visit    Currently in Pain?  Yes    Pain Score  1     Pain Location  Hip    Pain Orientation  Right    Pain Descriptors / Indicators  Sore    Pain Type  Surgical pain    Pain Onset  More than a month ago    Pain Frequency  Intermittent    Aggravating Factors   stand, walk, 3-4 am    Pain Relieving Factors  rest, ice , change positions    Multiple Pain Sites  No         [facility] Adult PT Treatment/Exercise - 10/09/18 0001      Knee/Hip Exercises: Stretches   Hip Flexor Stretch  Both;3 reps;30 seconds      Knee/Hip Exercises: Aerobic   Nustep  6 min L5 UE and LE       Knee/Hip Exercises: Standing   Functional Squat  1 set;15 reps      Knee/Hip Exercises: Seated   Long Arc Quad  Strengthening;Both;1 set;15 reps    Long Arc Quad Weight  1 lbs.      Knee/Hip Exercises: Supine   Bridges  Strengthening;Both;1 set;10 reps    Straight Leg Raises  Strengthening;Right;1 set;20 reps    Straight Leg Raises Limitations  1 lbs  Straight Leg Raise with External Rotation  Strengthening;Both;1 set;20 reps    Straight Leg Raise with External Rotation Limitations  1 lb    Other Supine Knee/Hip Exercises  bridge with march 1 lbs x 10 each     Other Supine Knee/Hip Exercises  lower abd x 10 post tilt with knee lift 1 lbs       Knee/Hip Exercises: Sidelying   Hip ABduction  Strengthening;Both;1 set;10 reps    Hip ABduction Limitations  1 lbs              PT Education - 10/09/18 1232    Education Details  ok to walk with cane with using caution and watch uneven surfaces    Person(s) Educated  Patient    Methods  Explanation;Demonstration    Comprehension  Verbalized understanding          PT Long Term Goals - 10/07/18 1107      PT LONG TERM GOAL #1   Title  Pt will be able to improve FOTO score to less than 40% to show greater functional mobility    Status  Unable to assess      PT LONG TERM GOAL #2   Title  Pt will be able to walk without cane and min gait  deviation most of the time, pain controlled    Baseline  walking with cane today with min pain, uses walker for community mobility    Status  On-going      PT LONG TERM GOAL #3   Title  Pt will be I with HEP upon Discharge to maintain functional gains    Status  On-going      PT LONG TERM GOAL #4   Title  Pt will be able to stand, do home tasks for 30 min at a time with pain controlled <3/10, with or without cane    Baseline  can be up for 30-40 min with pain moderate    Status  On-going            Plan - 10/09/18 1233    Clinical Impression Statement  Patient doing well, able to do what she needs to do in her home with mild levels of pain.  She is weak in abduction and extension, tightness in anterior hip.  She asks about driving and hip precautions.  Referred to MD for clearance.    PT Treatment/Interventions  ADLs/Self Care Home Management;Therapeutic activities;Patient/family education;Taping;Therapeutic exercise;Cryotherapy;Functional mobility training;Neuromuscular re-education;Gait training;Stair training;Moist Heat;Balance training;Passive range of motion;Manual techniques    PT Next Visit Plan  gait with cane, challenges    PT Home Exercise Plan  HHPT (saq, LAQ, standing 3 way hip, SLS with UE support, standing knee flexion, heel slides to 90, SAQ, clam with band, in supine, SL clam       Patient will benefit from skilled therapeutic intervention in order to improve the following deficits and impairments:  Abnormal gait, Decreased balance, Difficulty walking, Decreased strength, Decreased range of motion, Decreased endurance, Decreased activity tolerance, Postural dysfunction, Pain, Increased fascial restricitons, Decreased mobility  Visit Diagnosis: 1. Abnormal posture   2. Pain in right hip   3. History of total hip arthroplasty, right   4. Muscle weakness (generalized)        Problem List Patient Active Problem List   Diagnosis Date Noted  . Primary localized  osteoarthritis of right hip 08/22/2018  . Carpal tunnel syndrome, bilateral 08/06/2018  . Type 2 diabetes mellitus with peripheral neuropathy (HCC) 08/06/2018  .  Degenerative joint disease of right hip 08/06/2018  . Preoperative clearance 03/26/2018  . Upper back pain on left side 10/30/2016  . Encounter for postoperative carotid endarterectomy surveillance 12/07/2015  . Varicosities of leg 06/03/2015  . Acute left lumbar radiculopathy 04/15/2014  . Chronic sciatica of right side 03/17/2013  . Diabetic peripheral neuropathy (HCC) 03/17/2013  . Coronary artery disease   . Carotid artery stenosis s/p CEA 2013 10/16/2011  . Carotid artery occlusion without infarction, right 09/17/2011  . Carotid bruit 08/28/2011  . Gout 04/13/2011  . DM type 2 with diabetic peripheral neuropathy (HCC) 04/02/2008  . Hyperlipidemia associated with type 2 diabetes mellitus (HCC) 04/02/2008  . Essential hypertension 04/02/2008  . DEGENERATIVE DISC DISEASE, LUMBOSACRAL SPINE 04/02/2008    Marylynne Keelin 10/09/2018, 12:36 PM  [facility] [address] [place], [place], [address] Phone: [phone]   Fax:  [phone]  Name: Mallie DartingCynthia C Patlan MRN: [id number] Date of Birth: 03/25/1950  Karie MainlandJennifer Townsend Cudworth, PT 10/09/18 12:36 PM Phone: [phone] Fax: [phone]

## 2018-10-13 ENCOUNTER — Ambulatory Visit: Payer: Medicare HMO | Admitting: Physical Therapy

## 2018-10-13 ENCOUNTER — Other Ambulatory Visit: Payer: Self-pay

## 2018-10-13 DIAGNOSIS — M6281 Muscle weakness (generalized): Secondary | ICD-10-CM

## 2018-10-13 DIAGNOSIS — M25651 Stiffness of right hip, not elsewhere classified: Secondary | ICD-10-CM

## 2018-10-13 DIAGNOSIS — Z96641 Presence of right artificial hip joint: Secondary | ICD-10-CM

## 2018-10-13 DIAGNOSIS — R293 Abnormal posture: Secondary | ICD-10-CM

## 2018-10-13 DIAGNOSIS — M25551 Pain in right hip: Secondary | ICD-10-CM

## 2018-10-13 NOTE — Therapy (Signed)
[place], [place], [address] Phone: [phone]   Fax:  [phone]  Physical Therapy Treatment  Patient Details  Name: April Shaffer MRN: [id number] Date of Birth: 03-10-1950 Referring Provider (PT): Dr. Maxwell Marion    Encounter Date: 10/13/2018  PT End of Session - 10/13/18 1131    Visit Number  6    Number of Visits  12    Date for PT Re-Evaluation  10/28/18    PT Start Time  1046    PT Stop Time  1138    PT Time Calculation (min)  52 min    Activity Tolerance  Patient tolerated treatment well    Behavior During Therapy  [facility] for tasks assessed/performed       Past Medical History:  Diagnosis Date  . Arthritis   . Carotid artery narrowing   . Coronary artery disease    Cardiac catheterization November 2013: 50% ostial LAD stenosis 50% mid stenosis. 30% disease in the left circumflex.  . Diabetes mellitus, type 2 ([place])   . Hyperlipidemia   . Hypertension   . Onychomycosis of toenail 07/31/2016  . Sleep apnea       Had surgery to correct    Past Surgical History:  Procedure Laterality Date  . ABDOMINAL HYSTERECTOMY    . CARDIAC CATHETERIZATION    . ENDARTERECTOMY  09/27/2011   Procedure: RIGT ENDARTERECTOMY CAROTID;  Surgeon: Mal Misty, MD;  Location: [place];  Service: Vascular;  Laterality: Right;  . EPIDURAL BLOCK INJECTION  02/2008   Drs. Eulis Manly  . gyn surgery  2004   total hysterectomy for mennorhagia,,salpingoophorectomy  . TONSILLECTOMY    . TOTAL ABDOMINAL HYSTERECTOMY W/ BILATERAL SALPINGOOPHORECTOMY Bilateral 2000  . TOTAL HIP ARTHROPLASTY Right 08/22/2018   Procedure: TOTAL HIP ARTHROPLASTY;  Surgeon: Earlie Server, MD;  Location: [facility];  Service: Orthopedics;  Laterality: Right;  Marland Kitchen VARICOSE VEIN SURGERY  2008   stripping    There were no vitals filed for this visit.    [facility] Adult PT Treatment/Exercise - 10/13/18 0001      High Level Balance   High Level  Balance Activities  Side stepping;Backward walking;Direction changes;Turns;Sudden stops;Head turns;Tandem walking;Marching forwards;Marching backwards;Negotiating over obstacles      Knee/Hip Exercises: Stretches   Hip Flexor Stretch  Both;2 reps    Hip Flexor Stretch Limitations  standing in parallel       Knee/Hip Exercises: Aerobic   Nustep  6 min L5 UE and LE       Knee/Hip Exercises: Standing   Heel Raises  Both;2 sets;10 reps    Hip Abduction  Stengthening;Both;2 sets;10 reps    Functional Squat  2 sets;10 reps      Knee/Hip Exercises: Seated   Sit to Sand  2 sets;10 reps;with UE support;without UE support;Other (comment)   varied heights      Knee/Hip Exercises: Prone   Hip Extension  Strengthening;Right;1 set;15 reps    Hip Extension Limitations  bent knee     Straight Leg Raises  Strengthening;Right;1 set;15 reps    Other Prone Exercises  prone quad set x 10      Modalities   Modalities  Moist Heat      Moist Heat Therapy   Number Minutes Moist Heat  10 Minutes    Moist Heat Location  Hip   ant and post      Manual Therapy   Manual therapy comments  prone quad stretching  PT Long Term Goals - 10/07/18 1107      PT LONG TERM GOAL #1   Title  Pt will be able to improve FOTO score to less than 40% to show greater functional mobility    Status  Unable to assess      PT LONG TERM GOAL #2   Title  Pt will be able to walk without cane and min gait deviation most of the time, pain controlled    Baseline  walking with cane today with min pain, uses walker for community mobility    Status  On-going      PT LONG TERM GOAL #3   Title  Pt will be I with HEP upon Discharge to maintain functional gains    Status  On-going      PT LONG TERM GOAL #4   Title  Pt will be able to stand, do home tasks for 30 min at a time with pain controlled <3/10, with or without cane    Baseline  can be up for 30-40 min with pain moderate    Status  On-going             Plan - 10/13/18 1129    Clinical Impression Statement  Patient worked in standing about 30 min.  Able to do sit to stand without UE assist if used Airex in standard chair. Tightness in both quads , will benefit her to add that in the stretching. Cont POC    PT Treatment/Interventions  ADLs/Self Care Home Management;Therapeutic activities;Patient/family education;Taping;Therapeutic exercise;Cryotherapy;Functional mobility training;Neuromuscular re-education;Gait training;Stair training;Moist Heat;Balance training;Passive range of motion;Manual techniques    PT Next Visit Plan  stretching ant hip, quads, prone strength    PT Home Exercise Plan  HHPT (saq, LAQ, standing 3 way hip, SLS with UE support, standing knee flexion, heel slides to 90, SAQ, clam with band, in supine, SL clam    Consulted and Agree with Plan of Care  Patient       Patient will benefit from skilled therapeutic intervention in order to improve the following deficits and impairments:  Abnormal gait, Decreased balance, Difficulty walking, Decreased strength, Decreased range of motion, Decreased endurance, Decreased activity tolerance, Postural dysfunction, Pain, Increased fascial restricitons, Decreased mobility  Visit Diagnosis: 1. Abnormal posture   2. Pain in right hip   3. History of total hip arthroplasty, right   4. Muscle weakness (generalized)   5. Stiffness of right hip, not elsewhere classified        Problem List Patient Active Problem List   Diagnosis Date Noted  . Primary localized osteoarthritis of right hip 08/22/2018  . Carpal tunnel syndrome, bilateral 08/06/2018  . Type 2 diabetes mellitus with peripheral neuropathy (HCC) 08/06/2018  . Degenerative joint disease of right hip 08/06/2018  . Preoperative clearance 03/26/2018  . Upper back pain on left side 10/30/2016  . Encounter for postoperative carotid endarterectomy surveillance 12/07/2015  . Varicosities of leg 06/03/2015  .  Acute left lumbar radiculopathy 04/15/2014  . Chronic sciatica of right side 03/17/2013  . Diabetic peripheral neuropathy (HCC) 03/17/2013  . Coronary artery disease   . Carotid artery stenosis s/p CEA 2013 10/16/2011  . Carotid artery occlusion without infarction, right 09/17/2011  . Carotid bruit 08/28/2011  . Gout 04/13/2011  . DM type 2 with diabetic peripheral neuropathy (HCC) 04/02/2008  . Hyperlipidemia associated with type 2 diabetes mellitus (HCC) 04/02/2008  . Essential hypertension 04/02/2008  . DEGENERATIVE DISC DISEASE, LUMBOSACRAL SPINE 04/02/2008    Indie Boehne  10/13/2018, 11:32 AM  [facility] [address] [place], [place], [address] Phone: [phone]   Fax:  [phone]  Name: April Shaffer MRN: [id number] Date of Birth: 03/05/1950  Karie MainlandJennifer Alante Weimann, PT 10/13/18 11:32 AM Phone: [phone] Fax: [phone]

## 2018-10-15 ENCOUNTER — Ambulatory Visit: Payer: Medicare HMO | Admitting: Physical Therapy

## 2018-10-15 ENCOUNTER — Other Ambulatory Visit: Payer: Self-pay

## 2018-10-15 DIAGNOSIS — R293 Abnormal posture: Secondary | ICD-10-CM

## 2018-10-15 DIAGNOSIS — Z96641 Presence of right artificial hip joint: Secondary | ICD-10-CM

## 2018-10-15 DIAGNOSIS — M25551 Pain in right hip: Secondary | ICD-10-CM

## 2018-10-15 DIAGNOSIS — M6281 Muscle weakness (generalized): Secondary | ICD-10-CM

## 2018-10-15 DIAGNOSIS — M25651 Stiffness of right hip, not elsewhere classified: Secondary | ICD-10-CM

## 2018-10-15 NOTE — Therapy (Signed)
[place], [place], [address] Phone: [phone]   Fax:  [phone]  Physical Therapy Treatment  Patient Details  Name: April Shaffer MRN: [id number] Date of Birth: Dec 10, 1950 Referring Provider (PT): Dr. Maxwell Marion    Encounter Date: 10/15/2018  PT End of Session - 10/15/18 1113    Visit Number  7    Number of Visits  12    Date for PT Re-Evaluation  10/28/18    PT Start Time  1054    PT Stop Time  1139    PT Time Calculation (min)  45 min    Activity Tolerance  Patient tolerated treatment well    Behavior During Therapy  [facility] for tasks assessed/performed       Past Medical History:  Diagnosis Date  . Arthritis   . Carotid artery narrowing   . Coronary artery disease    Cardiac catheterization November 2013: 50% ostial LAD stenosis 50% mid stenosis. 30% disease in the left circumflex.  . Diabetes mellitus, type 2 ([place])   . Hyperlipidemia   . Hypertension   . Onychomycosis of toenail 07/31/2016  . Sleep apnea       Had surgery to correct    Past Surgical History:  Procedure Laterality Date  . ABDOMINAL HYSTERECTOMY    . CARDIAC CATHETERIZATION    . ENDARTERECTOMY  09/27/2011   Procedure: RIGT ENDARTERECTOMY CAROTID;  Surgeon: Mal Misty, MD;  Location: [place];  Service: Vascular;  Laterality: Right;  . EPIDURAL BLOCK INJECTION  02/2008   Drs. Eulis Manly  . gyn surgery  2004   total hysterectomy for mennorhagia,,salpingoophorectomy  . TONSILLECTOMY    . TOTAL ABDOMINAL HYSTERECTOMY W/ BILATERAL SALPINGOOPHORECTOMY Bilateral 2000  . TOTAL HIP ARTHROPLASTY Right 08/22/2018   Procedure: TOTAL HIP ARTHROPLASTY;  Surgeon: Earlie Server, MD;  Location: [facility];  Service: Orthopedics;  Laterality: Right;  Marland Kitchen VARICOSE VEIN SURGERY  2008   stripping    There were no vitals filed for this visit.  Subjective Assessment - 10/15/18 1105    Subjective  I am sore today/  AImost didnt come.    Currently in Pain?  Yes    Pain Score  5     Pain Location  Hip    Pain Orientation  Right;Posterior;Lateral    Pain Type  Surgical pain    Pain Onset  More than a month ago    Pain Frequency  Intermittent    Aggravating Factors   stand, walking, 3-4 am    Pain Relieving Factors  RICE          [facility] Adult PT Treatment/Exercise - 10/15/18 0001      Knee/Hip Exercises: Aerobic   Nustep  6 min L5 UE and LE       Knee/Hip Exercises: Supine   Bridges Limitations  2 x 10 green band     Straight Leg Raises  Strengthening;Right;1 set;20 reps    Straight Leg Raises Limitations  1 lbs     Straight Leg Raise with External Rotation  Strengthening;Both;1 set;20 reps    Straight Leg Raise with External Rotation Limitations  1 lb    Other Supine Knee/Hip Exercises  clam bilateral G band x 20       Knee/Hip Exercises: Sidelying   Hip ABduction  Strengthening;Right;2 sets;10 reps    Clams  2 x 10       Moist Heat Therapy   Number Minutes Moist Heat  10 Minutes  Moist Heat Location  Hip      Manual Therapy   Manual Therapy  Soft tissue mobilization;Myofascial release    Soft tissue mobilization  Rt glutes, lateral hip and thigh     Myofascial Release  Rt trunk, hip                   PT Long Term Goals - 10/07/18 1107      PT LONG TERM GOAL #1   Title  Pt will be able to improve FOTO score to less than 40% to show greater functional mobility    Status  Unable to assess      PT LONG TERM GOAL #2   Title  Pt will be able to walk without cane and min gait deviation most of the time, pain controlled    Baseline  walking with cane today with min pain, uses walker for community mobility    Status  On-going      PT LONG TERM GOAL #3   Title  Pt will be I with HEP upon Discharge to maintain functional gains    Status  On-going      PT LONG TERM GOAL #4   Title  Pt will be able to stand, do home tasks for 30 min at a time with pain controlled <3/10, with or without cane     Baseline  can be up for 30-40 min with pain moderate    Status  On-going            Plan - 10/15/18 1130    Clinical Impression Statement  Added more manual to ease soreness today. Weakness in Rt hip with clam exercies.  Chose open chain today on mat as patient was fatigued.  Progresing well, goals in progress and gait with cane is safe.    PT Treatment/Interventions  ADLs/Self Care Home Management;Therapeutic activities;Patient/family education;Taping;Therapeutic exercise;Cryotherapy;Functional mobility training;Neuromuscular re-education;Gait training;Stair training;Moist Heat;Balance training;Passive range of motion;Manual techniques    PT Next Visit Plan  stretching ant hip, quads, prone strength    PT Home Exercise Plan  HHPT (saq, LAQ, standing 3 way hip, SLS with UE support, standing knee flexion, heel slides to 90, SAQ, clam with band, in supine, SL clam       Patient will benefit from skilled therapeutic intervention in order to improve the following deficits and impairments:  Abnormal gait, Decreased balance, Difficulty walking, Decreased strength, Decreased range of motion, Decreased endurance, Decreased activity tolerance, Postural dysfunction, Pain, Increased fascial restricitons, Decreased mobility  Visit Diagnosis: 1. Abnormal posture   2. Pain in right hip   3. History of total hip arthroplasty, right   4. Muscle weakness (generalized)   5. Stiffness of right hip, not elsewhere classified        Problem List Patient Active Problem List   Diagnosis Date Noted  . Primary localized osteoarthritis of right hip 08/22/2018  . Carpal tunnel syndrome, bilateral 08/06/2018  . Type 2 diabetes mellitus with peripheral neuropathy (HCC) 08/06/2018  . Degenerative joint disease of right hip 08/06/2018  . Preoperative clearance 03/26/2018  . Upper back pain on left side 10/30/2016  . Encounter for postoperative carotid endarterectomy surveillance 12/07/2015  . Varicosities  of leg 06/03/2015  . Acute left lumbar radiculopathy 04/15/2014  . Chronic sciatica of right side 03/17/2013  . Diabetic peripheral neuropathy (HCC) 03/17/2013  . Coronary artery disease   . Carotid artery stenosis s/p CEA 2013 10/16/2011  . Carotid artery occlusion without infarction, right 09/17/2011  .  Carotid bruit 08/28/2011  . Gout 04/13/2011  . DM type 2 with diabetic peripheral neuropathy (HCC) 04/02/2008  . Hyperlipidemia associated with type 2 diabetes mellitus (HCC) 04/02/2008  . Essential hypertension 04/02/2008  . DEGENERATIVE DISC DISEASE, LUMBOSACRAL SPINE 04/02/2008    PAA,JENNIFER 10/15/2018, 11:33 AM  [facility] [address] [place], [place], [address] Phone: [phone]   Fax:  [phone]  Name: April Shaffer MRN: [id number] Date of Birth: 12/22/1950  Karie MainlandJennifer Paa, PT 10/15/18 11:35 AM Phone: [phone] Fax: [phone]

## 2018-10-20 ENCOUNTER — Encounter: Payer: Self-pay | Admitting: Physical Therapy

## 2018-10-20 ENCOUNTER — Other Ambulatory Visit: Payer: Self-pay

## 2018-10-20 ENCOUNTER — Ambulatory Visit: Payer: Medicare HMO | Admitting: Physical Therapy

## 2018-10-20 DIAGNOSIS — M25651 Stiffness of right hip, not elsewhere classified: Secondary | ICD-10-CM

## 2018-10-20 DIAGNOSIS — M6281 Muscle weakness (generalized): Secondary | ICD-10-CM

## 2018-10-20 DIAGNOSIS — M25551 Pain in right hip: Secondary | ICD-10-CM

## 2018-10-20 DIAGNOSIS — Z96641 Presence of right artificial hip joint: Secondary | ICD-10-CM

## 2018-10-20 DIAGNOSIS — R293 Abnormal posture: Secondary | ICD-10-CM | POA: Diagnosis not present

## 2018-10-20 NOTE — Therapy (Signed)
[facility] [address] [place], [place], [address] Phone: [phone]   Fax:  [phone]  Physical Therapy Treatment  Patient Details  Name: April DartingCynthia C Hyder MRN: [id number] Date of Birth: 06/09/1950 Referring Provider (PT): Dr. Leslye Peerafffey    Encounter Date: 10/20/2018  PT End of Session - 10/20/18 1312    Visit Number  8    Number of Visits  12    Date for PT Re-Evaluation  10/28/18    PT Start Time  1047    PT Stop Time  1131    PT Time Calculation (min)  44 min    Activity Tolerance  Patient tolerated treatment well    Behavior During Therapy  [facility] for tasks assessed/performed       Past Medical History:  Diagnosis Date  . Arthritis   . Carotid artery narrowing   . Coronary artery disease    Cardiac catheterization November 2013: 50% ostial LAD stenosis 50% mid stenosis. 30% disease in the left circumflex.  . Diabetes mellitus, type 2 (HCC)   . Hyperlipidemia   . Hypertension   . Onychomycosis of toenail 07/31/2016  . Sleep apnea       Had surgery to correct    Past Surgical History:  Procedure Laterality Date  . ABDOMINAL HYSTERECTOMY    . CARDIAC CATHETERIZATION    . ENDARTERECTOMY  09/27/2011   Procedure: RIGT ENDARTERECTOMY CAROTID;  Surgeon: Pryor OchoaJames D Lawson, MD;  [facility];  Service: Vascular;  Laterality: Right;  . EPIDURAL BLOCK INJECTION  02/2008   Drs. Dorthula NettlesBlackwell, Neudelman  . gyn surgery  2004   total hysterectomy for mennorhagia,,salpingoophorectomy  . TONSILLECTOMY    . TOTAL ABDOMINAL HYSTERECTOMY W/ BILATERAL SALPINGOOPHORECTOMY Bilateral 2000  . TOTAL HIP ARTHROPLASTY Right 08/22/2018   Procedure: TOTAL HIP ARTHROPLASTY;  Surgeon: Frederico Hammanaffrey, Daniel, MD;  Location[facility];  Service: Orthopedics;  Laterality: Right;  Marland Kitchen. VARICOSE VEIN SURGERY  2008   stripping    There were no vitals filed for this visit.  Subjective Assessment - 10/20/18 1102    Subjective  not hurting today, min pain over the  weekend.    Currently in Pain?  Yes    Pain Score  2     Pain Location  Hip    Pain Orientation  Right    Pain Descriptors / Indicators  Aching;Sore    Pain Type  Surgical pain    Pain Onset  More than a month ago    Pain Frequency  Intermittent          [facility] Adult PT Treatment/Exercise - 10/20/18 0001      Knee/Hip Exercises: Stretches   Active Hamstring Stretch  Right;3 reps;20 seconds    Passive Hamstring Stretch Limitations  inner thigh in standing x 30 x 2    Hip Flexor Stretch  Both;2 reps      Knee/Hip Exercises: Aerobic   Nustep  6 min L5 UE and LE       Knee/Hip Exercises: Standing   Heel Raises  Both;1 set;15 reps    Hip Flexion  Stengthening;Both;1 set;10 reps    Forward Lunges  Both;1 set;10 reps    Forward Step Up  Right;1 set;15 reps;Hand Hold: 1;Step Height: 8"    Functional Squat  2 sets;10 reps      Knee/Hip Exercises: Supine   Heel Slides  Strengthening;Both;1 set;15 reps    Bridges  Strengthening;Both;1 set;10 reps    Other Supine Knee/Hip Exercises  march  x 10 green band    Other Supine Knee/Hip Exercises  bridge with march x 10       Knee/Hip Exercises: Sidelying   Hip ABduction  Strengthening;Right;2 sets;10 reps    Clams  green x 20       Moist Heat Therapy   Number Minutes Moist Heat  10 Minutes    Moist Heat Location  Hip      Manual Therapy   Soft tissue mobilization  briief, lateral hip , glute in sidelying     Myofascial Release  Rt trunk, hip                   PT Long Term Goals - 10/07/18 1107      PT LONG TERM GOAL #1   Title  Pt will be able to improve FOTO score to less than 40% to show greater functional mobility    Status  Unable to assess      PT LONG TERM GOAL #2   Title  Pt will be able to walk without cane and min gait deviation most of the time, pain controlled    Baseline  walking with cane today with min pain, uses walker for community mobility    Status  On-going      PT LONG TERM GOAL #3   Title  Pt  will be I with HEP upon Discharge to maintain functional gains    Status  On-going      PT LONG TERM GOAL #4   Title  Pt will be able to stand, do home tasks for 30 min at a time with pain controlled <3/10, with or without cane    Baseline  can be up for 30-40 min with pain moderate    Status  On-going            Plan - 10/20/18 1314    Clinical Impression Statement  Continue to strengthen within her tolerance.  She has one more visit and feels ready for DC>    PT Treatment/Interventions  ADLs/Self Care Home Management;Therapeutic activities;Patient/family education;Taping;Therapeutic exercise;Cryotherapy;Functional mobility training;Neuromuscular re-education;Gait training;Stair training;Moist Heat;Balance training;Passive range of motion;Manual techniques    PT Next Visit Plan  stretching ant hip, quads, prone strength, DC FOTO    PT Home Exercise Plan  HHPT (saq, LAQ, standing 3 way hip, SLS with UE support, standing knee flexion, heel slides to 90, SAQ, clam with band, in supine, SL clam    Consulted and Agree with Plan of Care  Patient       Patient will benefit from skilled therapeutic intervention in order to improve the following deficits and impairments:  Abnormal gait, Decreased balance, Difficulty walking, Decreased strength, Decreased range of motion, Decreased endurance, Decreased activity tolerance, Postural dysfunction, Pain, Increased fascial restricitons, Decreased mobility  Visit Diagnosis: Abnormal posture  Pain in right hip  History of total hip arthroplasty, right  Muscle weakness (generalized)  Stiffness of right hip, not elsewhere classified     Problem List Patient Active Problem List   Diagnosis Date Noted  . Primary localized osteoarthritis of right hip 08/22/2018  . Carpal tunnel syndrome, bilateral 08/06/2018  . Type 2 diabetes mellitus with peripheral neuropathy ([place]) 08/06/2018  . Degenerative joint disease of right hip 08/06/2018  .  Preoperative clearance 03/26/2018  . Upper back pain on left side 10/30/2016  . Encounter for postoperative carotid endarterectomy surveillance 12/07/2015  . Varicosities of leg 06/03/2015  . Acute left lumbar radiculopathy 04/15/2014  . Chronic  sciatica of right side 03/17/2013  . Diabetic peripheral neuropathy (HCC) 03/17/2013  . Coronary artery disease   . Carotid artery stenosis s/p CEA 2013 10/16/2011  . Carotid artery occlusion without infarction, right 09/17/2011  . Carotid bruit 08/28/2011  . Gout 04/13/2011  . DM type 2 with diabetic peripheral neuropathy (HCC) 04/02/2008  . Hyperlipidemia associated with type 2 diabetes mellitus (HCC) 04/02/2008  . Essential hypertension 04/02/2008  . DEGENERATIVE DISC DISEASE, LUMBOSACRAL SPINE 04/02/2008    PAA,JENNIFER 10/20/2018, 1:15 PM  [facility] [address] [place], [place], [address] Phone: [phone]   Fax:  [phone]  Name: April DartingCynthia C Stech MRN: [id number] Date of Birth: 05/12/1950  Karie MainlandJennifer Paa, PT 10/20/18 1:15 PM Phone: [phone] Fax: [phone]

## 2018-10-22 ENCOUNTER — Ambulatory Visit: Payer: Medicare HMO | Admitting: Physical Therapy

## 2018-10-22 ENCOUNTER — Encounter: Payer: Self-pay | Admitting: Physical Therapy

## 2018-10-22 ENCOUNTER — Other Ambulatory Visit: Payer: Self-pay

## 2018-10-22 DIAGNOSIS — R293 Abnormal posture: Secondary | ICD-10-CM

## 2018-10-22 DIAGNOSIS — M25651 Stiffness of right hip, not elsewhere classified: Secondary | ICD-10-CM

## 2018-10-22 DIAGNOSIS — M25551 Pain in right hip: Secondary | ICD-10-CM

## 2018-10-22 DIAGNOSIS — Z96641 Presence of right artificial hip joint: Secondary | ICD-10-CM

## 2018-10-22 DIAGNOSIS — M6281 Muscle weakness (generalized): Secondary | ICD-10-CM

## 2018-10-22 NOTE — Therapy (Signed)
[place], [place], [address] Phone: [phone]   Fax:  [phone]  Physical Therapy Treatment/Discharge  Patient Details  Name: April Shaffer MRN: [id number] Date of Birth: 09/07/1950 Referring Provider (PT): Dr. Maxwell Marion    Encounter Date: 10/22/2018  PT End of Session - 10/22/18 1106    Visit Number  9    Number of Visits  12    Date for PT Re-Evaluation  10/28/18    PT Start Time  1050    PT Stop Time  1135    PT Time Calculation (min)  45 min    Activity Tolerance  Patient tolerated treatment well    Behavior During Therapy  [facility] for tasks assessed/performed       Past Medical History:  Diagnosis Date  . Arthritis   . Carotid artery narrowing   . Coronary artery disease    Cardiac catheterization November 2013: 50% ostial LAD stenosis 50% mid stenosis. 30% disease in the left circumflex.  . Diabetes mellitus, type 2 ([place])   . Hyperlipidemia   . Hypertension   . Onychomycosis of toenail 07/31/2016  . Sleep apnea       Had surgery to correct    Past Surgical History:  Procedure Laterality Date  . ABDOMINAL HYSTERECTOMY    . CARDIAC CATHETERIZATION    . ENDARTERECTOMY  09/27/2011   Procedure: RIGT ENDARTERECTOMY CAROTID;  Surgeon: Mal Misty, MD;  Location: [place];  Service: Vascular;  Laterality: Right;  . EPIDURAL BLOCK INJECTION  02/2008   Drs. Eulis Manly  . gyn surgery  2004   total hysterectomy for mennorhagia,,salpingoophorectomy  . TONSILLECTOMY    . TOTAL ABDOMINAL HYSTERECTOMY W/ BILATERAL SALPINGOOPHORECTOMY Bilateral 2000  . TOTAL HIP ARTHROPLASTY Right 08/22/2018   Procedure: TOTAL HIP ARTHROPLASTY;  Surgeon: Earlie Server, MD;  Location: [facility];  Service: Orthopedics;  Laterality: Right;  Marland Kitchen VARICOSE VEIN SURGERY  2008   stripping    There were no vitals filed for this visit.  Subjective Assessment - 10/22/18 1055    Subjective  Min pain 4 /10 today.   Finishing up PT today.    How long can you walk comfortably?  walking in the store without much trouble with cane or a buggy    Currently in Pain?  Yes    Pain Score  4     Pain Location  Hip    Pain Orientation  Right    Pain Descriptors / Indicators  Aching;Sore    Pain Type  Surgical pain    Pain Onset  More than a month ago    Pain Frequency  Intermittent    Aggravating Factors   standing , walking    Pain Relieving Factors  RICE         [facility] PT Assessment - 10/22/18 0001      Strength   Right Hip Flexion  4+/5    Left Hip Flexion  4+/5      Ambulation/Gait   Gait Comments  gait without cane, head turns , nods and varying speeds.  Required  min A due to scisssoring with horizontal head turns         [facility] Adult PT Treatment/Exercise - 10/22/18 0001      Self-Care   Other Self-Care Comments   balance, gait without cane, HEP , DC       Knee/Hip Exercises: Stretches   Active Hamstring Stretch  --    Hip Flexor Stretch  Both;3 reps      Knee/Hip Exercises: Aerobic   Nustep  6 min L5 UE and LE       Knee/Hip Exercises: Standing   Hip Flexion  Stengthening;Both;1 set;10 reps    Hip Flexion Limitations  green band     Other Standing Knee Exercises  hip ext and abd green bandx 10       Knee/Hip Exercises: Supine   Bridges  Strengthening;Both;1 set;10 reps    Bridges with Clamshell  Strengthening;Both;1 set;10 reps    Straight Leg Raises  Strengthening;Right;1 set;20 reps    Other Supine Knee/Hip Exercises  clam green x 20       Manual Therapy   Soft tissue mobilization  IASTM Rt anterior thigh, lateral hip in supine and sidelying                   PT Long Term Goals - 10/22/18 1057      PT LONG TERM GOAL #1   Title  Pt will be able to improve FOTO score to less than 40% to show greater functional mobility    Baseline  33%    Status  Achieved      PT LONG TERM GOAL #2   Title  Pt will be able to walk without cane and min gait deviation most of the  time, pain controlled    Baseline  walking without cane in the house, uses cane for community mobility Demo in clinic.    Status  Achieved      PT LONG TERM GOAL #3   Title  Pt will be I with HEP upon Discharge to maintain functional gains    Status  Achieved      PT LONG TERM GOAL #4   Title  Pt will be able to stand, do home tasks for 30 min at a time with pain controlled <3/10, with or without cane    Baseline  pain is usually <5/10 unless early in AM, gets better tas the day goes on,    Status  Achieved            Plan - 10/22/18 1106    Clinical Impression Statement  FOTO score drastically improved to 33% limited.  All goals met.  She cont to have min unsteadiness without cane but expect her to be able to stop using cane if she wants in her home, posisbly the community in a couple of months.  Pleased with progress.    PT Treatment/Interventions  ADLs/Self Care Home Management;Therapeutic activities;Patient/family education;Taping;Therapeutic exercise;Cryotherapy;Functional mobility training;Neuromuscular re-education;Gait training;Stair training;Moist Heat;Balance training;Passive range of motion;Manual techniques    PT Next Visit Plan  stretching ant hip, quads, prone strength, DC FOTO    PT Home Exercise Plan  HHPT (saq, LAQ, standing 3 way hip, SLS with UE support, standing knee flexion, heel slides to 90, SAQ, clam with band, in supine, SL clam    Consulted and Agree with Plan of Care  Patient       Patient will benefit from skilled therapeutic intervention in order to improve the following deficits and impairments:  Abnormal gait, Decreased balance, Difficulty walking, Decreased strength, Decreased range of motion, Decreased endurance, Decreased activity tolerance, Postural dysfunction, Pain, Increased fascial restricitons, Decreased mobility  Visit Diagnosis: Abnormal posture  Pain in right hip  History of total hip arthroplasty, right  Muscle weakness  (generalized)  Stiffness of right hip, not elsewhere classified     Problem List Patient Active Problem List  Diagnosis Date Noted  . Primary localized osteoarthritis of right hip 08/22/2018  . Carpal tunnel syndrome, bilateral 08/06/2018  . Type 2 diabetes mellitus with peripheral neuropathy ([place]) 08/06/2018  . Degenerative joint disease of right hip 08/06/2018  . Preoperative clearance 03/26/2018  . Upper back pain on left side 10/30/2016  . Encounter for postoperative carotid endarterectomy surveillance 12/07/2015  . Varicosities of leg 06/03/2015  . Acute left lumbar radiculopathy 04/15/2014  . Chronic sciatica of right side 03/17/2013  . Diabetic peripheral neuropathy ([place]) 03/17/2013  . Coronary artery disease   . Carotid artery stenosis s/p CEA 2013 10/16/2011  . Carotid artery occlusion without infarction, right 09/17/2011  . Carotid bruit 08/28/2011  . Gout 04/13/2011  . DM type 2 with diabetic peripheral neuropathy ([place]) 04/02/2008  . Hyperlipidemia associated with type 2 diabetes mellitus ([place]) 04/02/2008  . Essential hypertension 04/02/2008  . [place] DISEASE, LUMBOSACRAL SPINE 04/02/2008    Tashawn Laswell 10/22/2018, 1:28 PM  [facility] [address] [place], [place], [address] Phone: [phone]   Fax:  [phone]  Name: April Shaffer MRN: [id number] Date of Birth: [birth year]   PHYSICAL THERAPY DISCHARGE SUMMARY  Visits from Start of Care: 9  Current functional level related to goals / functional outcomes: See above    Remaining deficits: Gait with cane, hip abd and extension strength, Rt hip pain localized, minimal    Education / Equipment: HEP, posture, RICE  Plan: Patient agrees to discharge.  Patient goals were met. Patient is being discharged due to meeting the stated rehab goals.  ?????     Raeford Razor, PT 10/22/18 1:31 PM Phone: [phone] Fax:  [phone]    Raeford Razor, PT 10/22/18 1:29 PM Phone: [phone] Fax: [phone]

## 2018-12-05 ENCOUNTER — Other Ambulatory Visit: Payer: Self-pay

## 2018-12-05 ENCOUNTER — Encounter: Payer: Self-pay | Admitting: Internal Medicine

## 2018-12-05 ENCOUNTER — Ambulatory Visit: Payer: Medicare HMO | Admitting: Internal Medicine

## 2018-12-05 VITALS — BP 138/72 | HR 80 | Temp 97.2°F | Resp 12 | Ht 63.0 in | Wt 159.0 lb

## 2018-12-05 DIAGNOSIS — I10 Essential (primary) hypertension: Secondary | ICD-10-CM

## 2018-12-05 DIAGNOSIS — M1611 Unilateral primary osteoarthritis, right hip: Secondary | ICD-10-CM

## 2018-12-05 DIAGNOSIS — D649 Anemia, unspecified: Secondary | ICD-10-CM

## 2018-12-05 DIAGNOSIS — E1142 Type 2 diabetes mellitus with diabetic polyneuropathy: Secondary | ICD-10-CM

## 2018-12-05 DIAGNOSIS — E1169 Type 2 diabetes mellitus with other specified complication: Secondary | ICD-10-CM

## 2018-12-05 DIAGNOSIS — E785 Hyperlipidemia, unspecified: Secondary | ICD-10-CM

## 2018-12-05 MED ORDER — GABAPENTIN 100 MG PO CAPS: ORAL_CAPSULE | ORAL | 11 refills | Status: DC

## 2018-12-05 NOTE — Progress Notes (Signed)
Subjective:    Patient ID: April Shaffer, female   DOB: 08-01-1950, 68 y.o.   MRN: [id number]   HPI   1.  Total right hip arthroplasty performed 08/22/2018.   Asking for Oxycodone--has not had any since August.  Discussed generally do not recommend opiates this far out from surgery.  Recommend Tylenol.   Did have rehab.   Function better.    2.  DM:  A1C was 6.1 in June.  Generally sugars running 120s or less.  Has lost 6 lbs since her surgery. Needs eye exam--she will make the appt. Feet do tingle.  She is taking 300 mg 3 times daily of Gabapentin.  Microalbuminuria improved with last check in June  3.  Hypertension:  BP okay.  She is gradually increasing her physical activity following hip replacement.  4.  Hyperlipidemia:  Could not take the fish oil as made her nauseated.  On statin and Zetia, however.   Current Meds  Medication Sig  . acetaminophen (TYLENOL) 650 MG CR tablet Take 1,300 mg by mouth daily.  Marland Kitchen allopurinol (ZYLOPRIM) 300 MG tablet Take 1 tablet by mouth once daily  . amLODipine (NORVASC) 10 MG tablet Take 1 tablet by mouth once daily  . atorvastatin (LIPITOR) 80 MG tablet TAKE 1 TABLET BY MOUTH ONCE DAILY WITH  EVENING  MEAL (Patient taking differently: Take 80 mg by mouth daily at 6 PM. )  . blood glucose meter kit and supplies KIT Check sugars twice daily  . carvedilol (COREG CR) 20 MG 24 hr capsule Take 1 capsule by mouth once daily  . clopidogrel (PLAVIX) 75 MG tablet Take 1 tablet by mouth once daily with breakfast  . COLCRYS 0.6 MG tablet 1 by mouth twice a day as needed for gout attacks (Patient taking differently: Take 0.6 mg by mouth 2 (two) times daily as needed (gout). )  . fexofenadine (ALLEGRA) 180 MG tablet Take 1 tablet (180 mg total) by mouth daily.  Marland Kitchen gabapentin (NEURONTIN) 300 MG capsule TAKE 1 CAPSULE BY MOUTH THREE TIMES DAILY (Patient taking differently: Take 300 mg by mouth 3 (three) times daily. )  . glucose blood test strip Check sugars  twice daily  . metFORMIN (GLUCOPHAGE-XR) 500 MG 24 hr tablet TAKE 1 TABLET BY MOUTH TWICE DAILY WITH MEALS  . omega-3 acid ethyl esters (LOVAZA) 1 g capsule Take 1 capsule (1 g total) by mouth 2 (two) times daily.  Marland Kitchen OVER THE COUNTER MEDICATION Take 1 tablet by mouth daily. Calcium  644m  and vitamin D3  . oxyCODONE (OXY IR/ROXICODONE) 5 MG immediate release tablet Take one tab po q4-6hrs prn pain, may need 1-2 first couple weeks   Allergies  Allergen Reactions  . Lisinopril-Hydrochlorothiazide Other (See Comments)    Angioedema  - Tongue swelling   . Codeine Itching  . Oxycodone Itching  . Simvastatin Other (See Comments)    Muscle pain  . Tramadol Itching     Review of Systems    Objective:   BP 138/72 (BP Location: Left Arm, Patient Position: Sitting, Cuff Size: Normal)   Pulse 80   Temp (!) 97.2 F (36.2 C) (Temporal)   Resp 12   Ht _0  (1.6 m)   Wt 159 lb (72.1 kg)   BMI 28.17 kg/m   Physical Exam  NAD Lungs:  CTA CV:  RRR without murmur or rub.  Radial and DP pulses normal and equal Abd:  S, NT, No HSM or mass, + BS  LE:  No edema   Assessment & Plan  1.  Diabetic peripheral neuropathy:  Titrate up to 600 mg of Gabapentin at bedtime or to dose that better controls symptoms.  2.  Hypertension:  Control okay--would like to see in 120/70 range.  3.  Hyperlipidemia: Nonfasting, so no FLP  4.  DM:  A1C.  Has been well controlled  5.  Anemia with surgery.  CBC  6.  HM:  Did receive flu vaccine

## 2018-12-06 LAB — CBC WITH DIFFERENTIAL/PLATELET
Basophils Absolute: 0.1 10*3/uL (ref 0.0–0.2)
Basos: 1 %
EOS (ABSOLUTE): 0.2 10*3/uL (ref 0.0–0.4)
Eos: 3 %
Hematocrit: 36.8 % (ref 34.0–46.6)
Hemoglobin: 12.3 g/dL (ref 11.1–15.9)
Immature Grans (Abs): 0 10*3/uL (ref 0.0–0.1)
Immature Granulocytes: 0 %
Lymphocytes Absolute: 2 10*3/uL (ref 0.7–3.1)
Lymphs: 30 %
MCH: 29.4 pg (ref 26.6–33.0)
MCHC: 33.4 g/dL (ref 31.5–35.7)
MCV: 88 fL (ref 79–97)
Monocytes Absolute: 0.6 10*3/uL (ref 0.1–0.9)
Monocytes: 9 %
Neutrophils Absolute: 4 10*3/uL (ref 1.4–7.0)
Neutrophils: 57 %
Platelets: 237 10*3/uL (ref 150–450)
RBC: 4.19 x10E6/uL (ref 3.77–5.28)
RDW: 14.2 % (ref 11.7–15.4)
WBC: 6.8 10*3/uL (ref 3.4–10.8)

## 2018-12-06 LAB — HGB A1C W/O EAG: Hgb A1c MFr Bld: 6.2 % — ABNORMAL HIGH (ref 4.8–5.6)

## 2019-01-28 ENCOUNTER — Telehealth: Payer: Self-pay | Admitting: Internal Medicine

## 2019-01-28 NOTE — Telephone Encounter (Signed)
Patient called just to let us know will be  having eye surgery due to cataracts on both eyes on 03/05/2019 and 03/26/2019 with Dr. Sherral Hammers.

## 2019-02-02 ENCOUNTER — Other Ambulatory Visit: Payer: Self-pay | Admitting: Internal Medicine

## 2019-02-27 HISTORY — PX: CATARACT EXTRACTION, BILATERAL: SHX1313

## 2019-02-27 HISTORY — PX: EYE SURGERY: SHX253

## 2019-03-10 ENCOUNTER — Other Ambulatory Visit: Payer: Self-pay | Admitting: Internal Medicine

## 2019-03-10 DIAGNOSIS — Z1231 Encounter for screening mammogram for malignant neoplasm of breast: Secondary | ICD-10-CM

## 2019-03-12 IMAGING — MG DIGITAL SCREENING BILATERAL MAMMOGRAM WITH TOMO AND CAD
8 series · 8 of 24 positions shown · non-contrast
Comparison: Previous exam(s).

CLINICAL DATA: Screening.

EXAM:
DIGITAL SCREENING BILATERAL MAMMOGRAM WITH TOMO AND CAD

[L CC synth-2D]
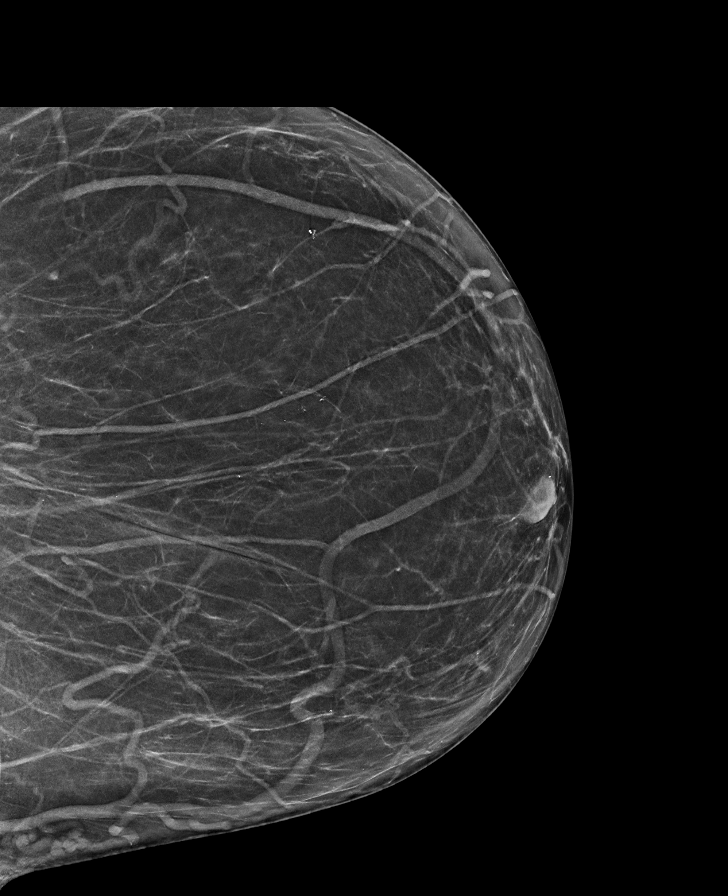

[R MLO synth-2D]
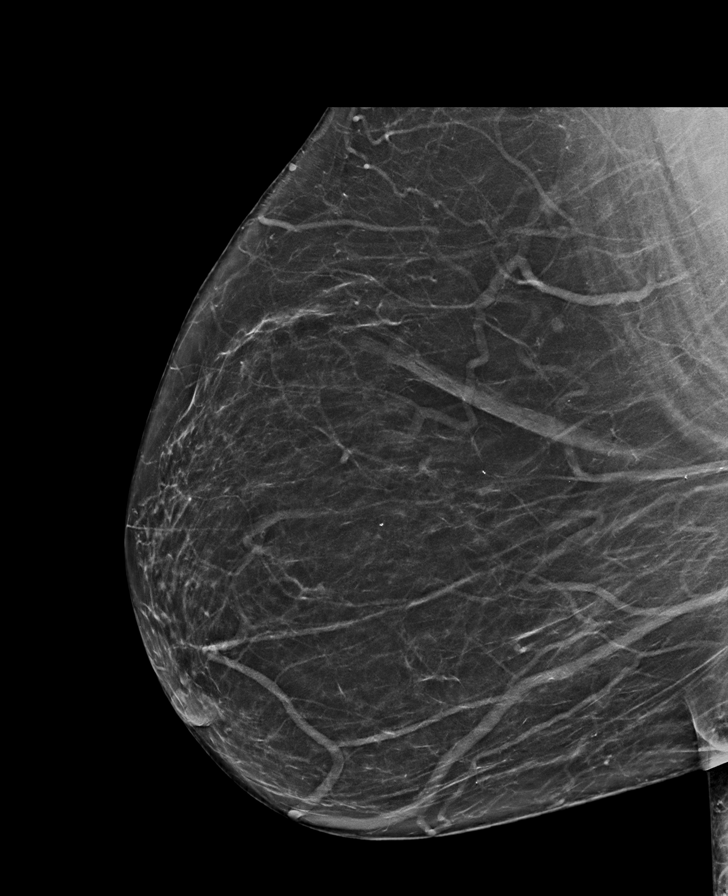

[L MLO synth-2D]
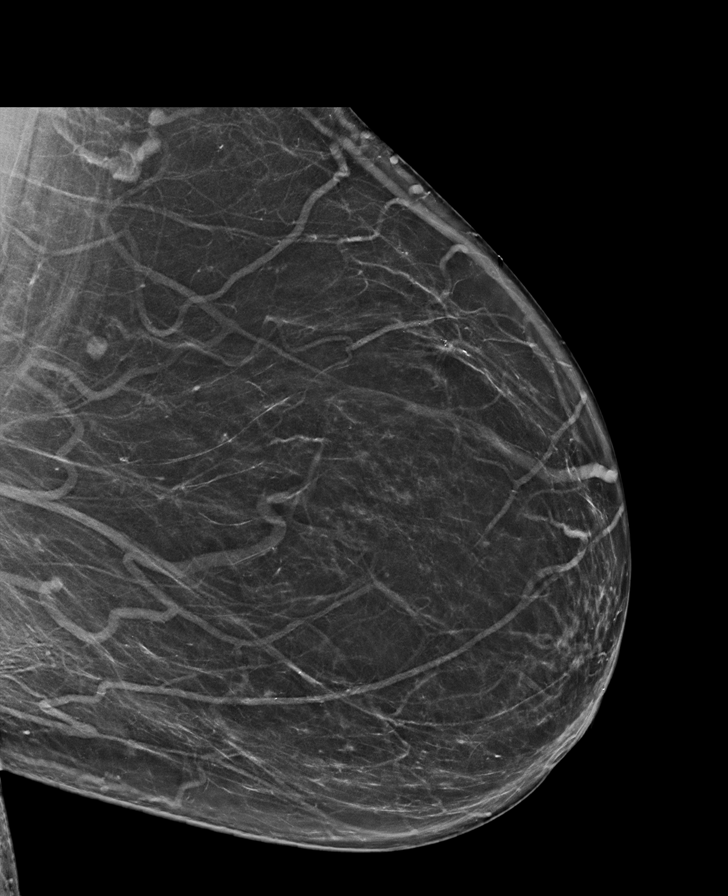

[R CC synth-2D]
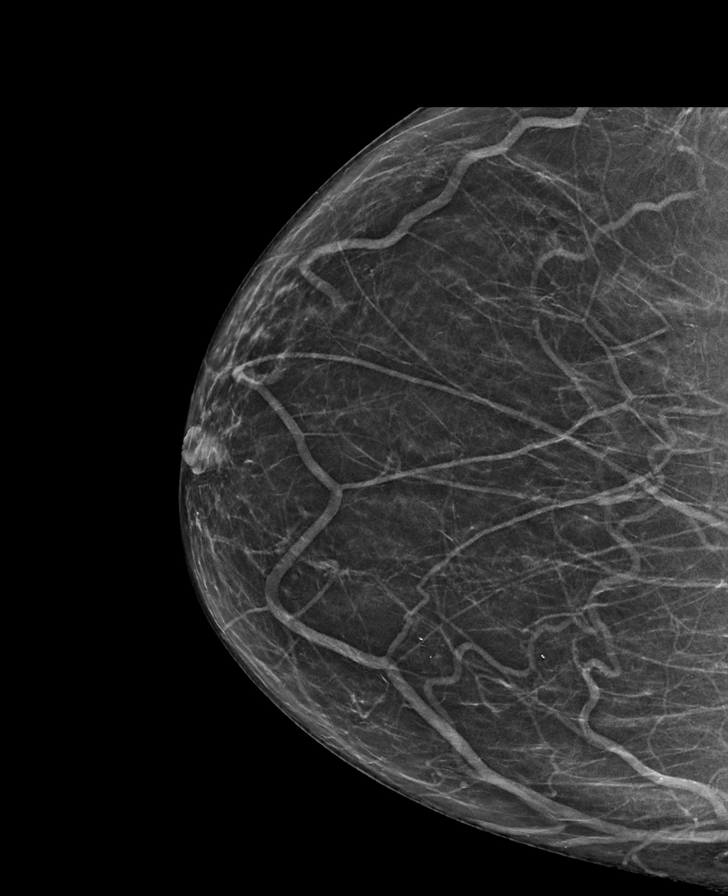

[R CC tomo · tomo slice 32/63.0]
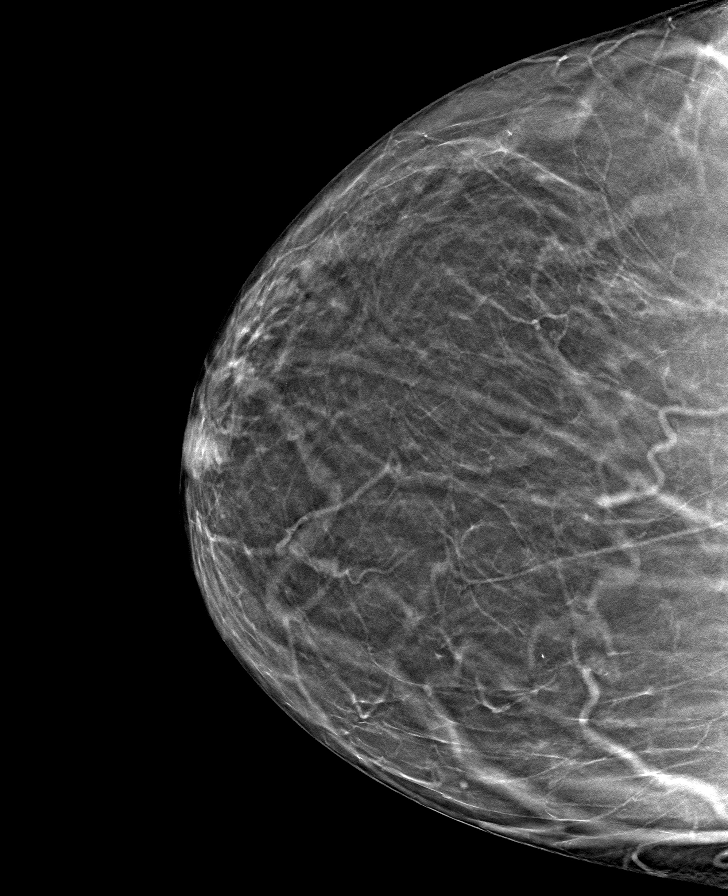

[L CC tomo · tomo slice 33/64.0]
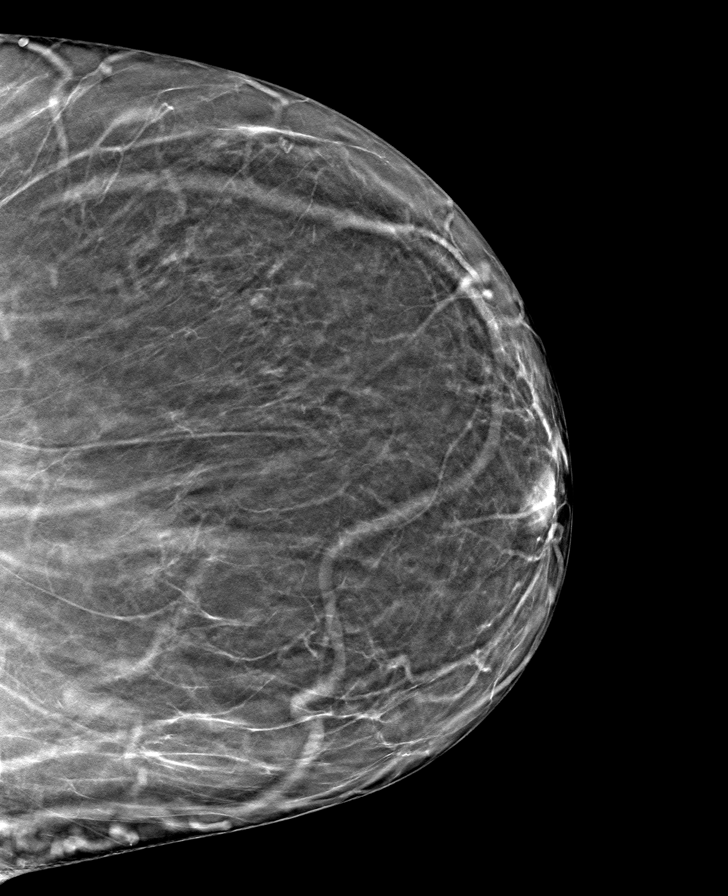

[L MLO tomo · tomo slice 39/76.0]
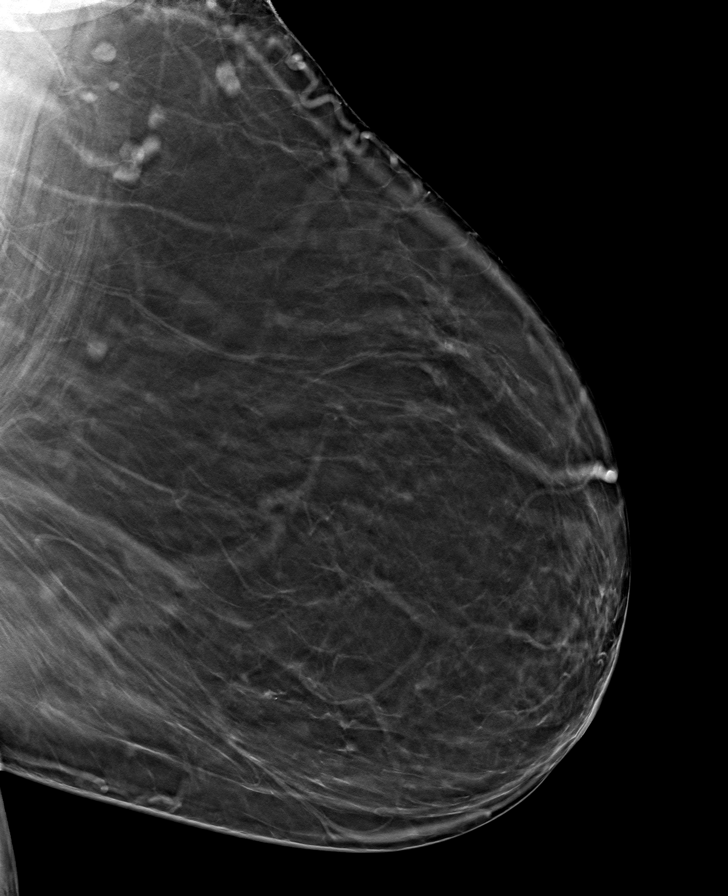

[R MLO tomo · tomo slice 38/75.0]
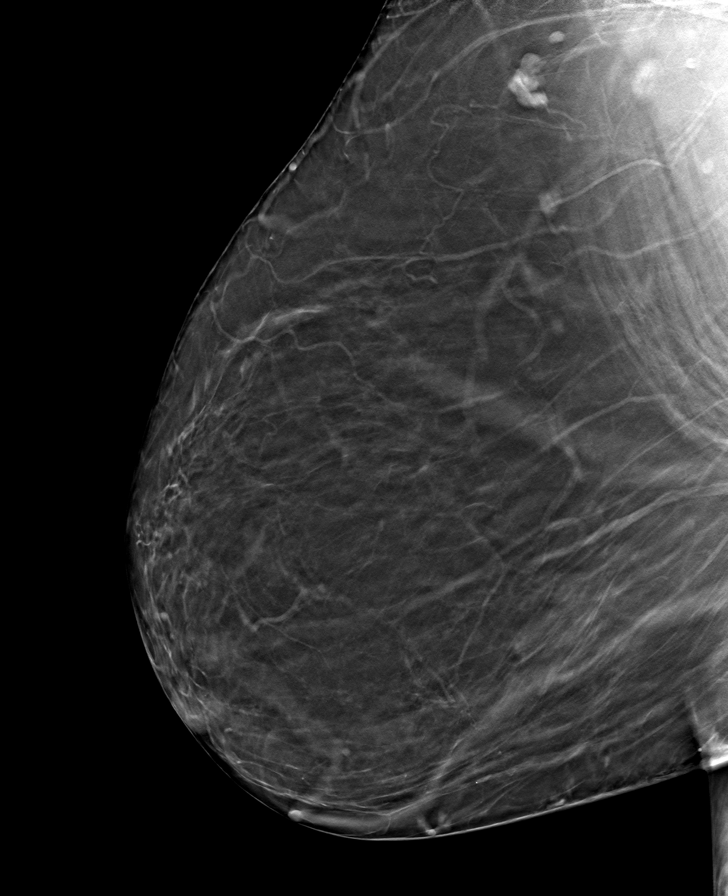

[8 of 24 positions shown; findings below may reference images not displayed]

ACR Breast Density Category b: There are scattered areas of
fibroglandular density.
FINDINGS: There are no findings suspicious for malignancy. Images were
processed with CAD.
IMPRESSION: No mammographic evidence of malignancy. A result letter of this
screening mammogram will be mailed directly to the patient.

RECOMMENDATION:
Screening mammogram in one year. (Code:[id number])

BI-RADS CATEGORY  1: Negative.

## 2019-03-13 ENCOUNTER — Other Ambulatory Visit: Payer: Self-pay

## 2019-03-13 ENCOUNTER — Telehealth (HOSPITAL_COMMUNITY): Payer: Self-pay

## 2019-03-13 DIAGNOSIS — I6521 Occlusion and stenosis of right carotid artery: Secondary | ICD-10-CM

## 2019-03-13 NOTE — Telephone Encounter (Signed)

## 2019-03-16 ENCOUNTER — Ambulatory Visit (INDEPENDENT_AMBULATORY_CARE_PROVIDER_SITE_OTHER): Payer: Medicare HMO | Admitting: Physician Assistant

## 2019-03-16 ENCOUNTER — Ambulatory Visit (HOSPITAL_COMMUNITY)
Admission: RE | Admit: 2019-03-16 | Discharge: 2019-03-16 | Disposition: A | Discharge status: Home / Self Care | Payer: Medicare HMO | Source: Ambulatory Visit | Attending: Surgery | Admitting: Surgery

## 2019-03-16 ENCOUNTER — Other Ambulatory Visit: Payer: Self-pay

## 2019-03-16 VITALS — BP 157/81 | HR 68 | Temp 97.2°F | Resp 14 | Ht 63.0 in | Wt 166.0 lb

## 2019-03-16 DIAGNOSIS — I6521 Occlusion and stenosis of right carotid artery: Secondary | ICD-10-CM | POA: Diagnosis present

## 2019-03-16 DIAGNOSIS — I2510 Atherosclerotic heart disease of native coronary artery without angina pectoris: Secondary | ICD-10-CM | POA: Diagnosis not present

## 2019-03-16 NOTE — Progress Notes (Signed)
Established Carotid Patient   History of Present Illness   April Shaffer is a 69 y.o. (06/01/1950) female who presents with chief complaint: carotid artery stenosis.  Surgical history significant for right carotid endarterectomy in August 2013 which has since occluded asymptomatically.  She is followed for 6 to 79% stenosis of left internal carotid artery.  She denies any signs or symptoms of stroke including slurring speech, changes in vision, or right-sided weakness.  She is taking an aspirin and a statin daily.  She is following with her primary care physician regularly for management of chronic medical conditions including hypertension and diabetes.  The patient's PMH, PSH, SH, and FamHx were reviewed on and are unchanged from prior visit.  Current Outpatient Medications  Medication Sig Dispense Refill  . acetaminophen (TYLENOL) 650 MG CR tablet Take 1,300 mg by mouth daily.    Marland Kitchen allopurinol (ZYLOPRIM) 300 MG tablet Take 1 tablet by mouth once daily 90 tablet 3  . amLODipine (NORVASC) 10 MG tablet Take 1 tablet by mouth once daily 90 tablet 3  . aspirin EC 81 MG tablet Take 81 mg by mouth daily.    Marland Kitchen atorvastatin (LIPITOR) 80 MG tablet TAKE 1 TABLET BY MOUTH ONCE DAILY WITH  EVENING  MEAL (Patient taking differently: Take 80 mg by mouth daily at 6 PM. ) 90 tablet 3  . blood glucose meter kit and supplies KIT Check sugars twice daily 1 each 0  . Calcium Carb-Cholecalciferol (CALCIUM 500 + D3 PO) Take 1 Units by mouth 2 (two) times daily.    . carvedilol (COREG CR) 20 MG 24 hr capsule Take 1 capsule by mouth once daily 90 capsule 3  . clopidogrel (PLAVIX) 75 MG tablet Take 1 tablet by mouth once daily with breakfast 90 tablet 3  . COLCRYS 0.6 MG tablet 1 by mouth twice a day as needed for gout attacks (Patient taking differently: Take 0.6 mg by mouth 2 (two) times daily as needed (gout). ) 60 tablet 5  . cyclobenzaprine (FLEXERIL) 5 MG tablet Take 1 tablet (5 mg total) by mouth 3  (three) times daily as needed for muscle spasms. 20 tablet 1  . fexofenadine (ALLEGRA) 180 MG tablet Take 1 tablet (180 mg total) by mouth daily.    Marland Kitchen gabapentin (NEURONTIN) 100 MG capsule 1 cap by mouth with 300 mg cap at bedtime and increase every 3 nights by 100 mg to max of 600 mg combined dose at bedtime 90 capsule 11  . gabapentin (NEURONTIN) 300 MG capsule TAKE 1 CAPSULE BY MOUTH THREE TIMES DAILY (Patient taking differently: Take 300 mg by mouth 3 (three) times daily. ) 90 capsule 6  . glucose blood test strip Check sugars twice daily 100 each 11  . Liniments (SALONPAS PAIN RELIEF PATCH EX) Place 1 patch onto the skin daily as needed.     . metFORMIN (GLUCOPHAGE-XR) 500 MG 24 hr tablet TAKE 1 TABLET BY MOUTH TWICE DAILY WITH MEALS 180 tablet 3  . omega-3 acid ethyl esters (LOVAZA) 1 g capsule Take 1 capsule (1 g total) by mouth 2 (two) times daily. 60 capsule 11  . OVER THE COUNTER MEDICATION Take 1 tablet by mouth daily. Calcium  641m  and vitamin D3    . ezetimibe (ZETIA) 10 MG tablet Take 1 tablet (10 mg total) by mouth daily. 90 tablet 3  . oxyCODONE (OXY IR/ROXICODONE) 5 MG immediate release tablet Take one tab po q4-6hrs prn pain, may need 1-2 first couple  weeks (Patient not taking: Reported on 03/16/2019) 40 tablet 0   No current facility-administered medications for this visit.    On ROS today: 10 system ROS is negative unless otherwise noted in HPI   Physical Examination   Vitals:   03/16/19 1052 03/16/19 1056  BP: (!) 164/79 (!) 157/81  Pulse: 68 68  Resp: 14   Temp: (!) 97.2 F (36.2 C)   TempSrc: Temporal   SpO2: 100%   Weight: 166 lb (75.3 kg)   Height: '5\' 3"'  (1.6 m)    Body mass index is 29.41 kg/m.  General Alert, O x 3, WD, NAD  Neck Supple, mid-line trachea, right neck incision well-healed    Pulmonary Sym exp, good B air movt, CTA B  Cardiac RRR, Nl S1, S2  Vascular Vessel Right Left  Radial Palpable Palpable  Brachial Palpable Palpable     Gastro- intestinal soft, non-distended, non-tender to palpation, No guarding or rebound,   Musculo- skeletal M/S 5/5 throughout  , Extremities without ischemic changes    Neurologic Cranial nerves 2-12 intact , Pain and light touch intact in extremities , Motor exam as listed above    Non-Invasive Vascular Imaging   B Carotid Duplex :   R ICA stenosis:  Occluded  R VA: patent and antegrade  L ICA stenosis:  60-79%  L VA:  patent and antegrade   Medical Decision Making   April Shaffer is a 69 y.o. female who presents with known occlusion of right ICA and severe stenosis of left ICA by carotid duplex   Carotid duplex demonstrates left ICA 60 to 79% with increase in systolic and diastolic velocities compared to 02/2018 exam  L ICA stenosis remains asymptomatic  Continue aspirin and statin daily  Recheck left carotid duplex in 6 months; can probably follow annually if velocities remain unchanged  Follow with PCP for management of chronic medical conditions including diabetes and hypertension   Dagoberto Ligas PA-C [facility] Office: [phone]  Clinic MD: Trula Slade

## 2019-03-18 ENCOUNTER — Other Ambulatory Visit: Payer: Self-pay | Admitting: *Deleted

## 2019-03-18 DIAGNOSIS — I6521 Occlusion and stenosis of right carotid artery: Secondary | ICD-10-CM

## 2019-04-10 ENCOUNTER — Other Ambulatory Visit: Payer: Self-pay | Admitting: Cardiovascular Disease

## 2019-04-17 ENCOUNTER — Ambulatory Visit
Admission: RE | Admit: 2019-04-17 | Discharge: 2019-04-17 | Disposition: A | Discharge status: Home / Self Care | Payer: Medicare HMO | Source: Ambulatory Visit | Attending: Internal Medicine | Admitting: Internal Medicine

## 2019-04-17 ENCOUNTER — Other Ambulatory Visit: Payer: Self-pay

## 2019-04-17 DIAGNOSIS — Z1231 Encounter for screening mammogram for malignant neoplasm of breast: Secondary | ICD-10-CM

## 2019-06-01 ENCOUNTER — Telehealth: Payer: Self-pay | Admitting: Internal Medicine

## 2019-06-01 NOTE — Telephone Encounter (Signed)
Patient called requesting any update on COLCRYS 0.6 MG tablet insurance form that was requested 3 months ago.

## 2019-06-02 ENCOUNTER — Other Ambulatory Visit: Payer: Self-pay | Admitting: Internal Medicine

## 2019-06-02 DIAGNOSIS — E1142 Type 2 diabetes mellitus with diabetic polyneuropathy: Secondary | ICD-10-CM

## 2019-06-04 ENCOUNTER — Other Ambulatory Visit: Payer: Self-pay | Admitting: Internal Medicine

## 2019-06-04 MED ORDER — COLCHICINE 0.6 MG PO CAPS: ORAL_CAPSULE | ORAL | 3 refills | Status: DC

## 2019-06-04 NOTE — Telephone Encounter (Signed)
Spoke with patient. Will call to find out what her insurance covers that is equivalent to this medication and will call back tomorrow

## 2019-06-08 ENCOUNTER — Other Ambulatory Visit: Payer: Self-pay | Admitting: *Deleted

## 2019-06-08 ENCOUNTER — Other Ambulatory Visit: Payer: Self-pay

## 2019-06-08 MED ORDER — EZETIMIBE 10 MG PO TABS: 10.0000 mg | ORAL_TABLET | Freq: Every day | ORAL | 0 refills | Status: DC

## 2019-06-09 ENCOUNTER — Other Ambulatory Visit: Payer: Self-pay

## 2019-06-09 DIAGNOSIS — I779 Disorder of arteries and arterioles, unspecified: Secondary | ICD-10-CM

## 2019-06-09 MED ORDER — CLOPIDOGREL BISULFATE 75 MG PO TABS: ORAL_TABLET | ORAL | 3 refills | Status: DC

## 2019-06-10 ENCOUNTER — Encounter: Payer: Self-pay | Admitting: Internal Medicine

## 2019-06-10 ENCOUNTER — Ambulatory Visit: Payer: Medicare HMO | Admitting: Internal Medicine

## 2019-06-10 VITALS — BP 134/82 | HR 76 | Resp 12 | Ht 63.0 in | Wt 172.0 lb

## 2019-06-10 DIAGNOSIS — E1142 Type 2 diabetes mellitus with diabetic polyneuropathy: Secondary | ICD-10-CM

## 2019-06-10 DIAGNOSIS — M25512 Pain in left shoulder: Secondary | ICD-10-CM | POA: Diagnosis not present

## 2019-06-10 MED ORDER — GABAPENTIN 300 MG PO CAPS: ORAL_CAPSULE | ORAL | 3 refills | Status: DC

## 2019-06-10 NOTE — Progress Notes (Signed)
Subjective:    Patient ID: April Shaffer, female   DOB: [birth year], 69 y.o.   MRN: [id number]   HPI   Left shoulder pain:  Relates the pain to starting after she received her Herpes Zoster vaccine on 03/11/2018.   Feels the pain started right after the shot and has gradually worsened with time, though has had times when it feels a bit better.  Has a hard time describing, but perhaps like a burning into the bone. Feels she has a "ball" in her left trap with discomfort and radiates down into lateral upper arm.   Numbness on fingertips of fingers 2-5 on both hands.   Strength is okay, but unable to get her shoulder to abduct above 90 degrees.  Hasn't really noted a problem with flexion. Taking Tylenol 650 mg CR, total of 1,300 mg daily.   Has Tizanidine for her hip, but has not used in some time.  Current Meds  Medication Sig  . acetaminophen (TYLENOL) 650 MG CR tablet Take 1,300 mg by mouth daily.  Marland Kitchen allopurinol (ZYLOPRIM) 300 MG tablet Take 1 tablet by mouth once daily  . amLODipine (NORVASC) 10 MG tablet Take 1 tablet by mouth once daily  . aspirin EC 81 MG tablet Take 81 mg by mouth daily.  Marland Kitchen atorvastatin (LIPITOR) 80 MG tablet TAKE 1 TABLET BY MOUTH ONCE DAILY WITH  EVENING  MEAL (Patient taking differently: Take 80 mg by mouth daily at 6 PM. )  . blood glucose meter kit and supplies KIT Check sugars twice daily  . Calcium Carb-Cholecalciferol (CALCIUM 500 + D3 PO) Take 1 Units by mouth 2 (two) times daily.  . carvedilol (COREG CR) 20 MG 24 hr capsule Take 1 capsule by mouth once daily  . clopidogrel (PLAVIX) 75 MG tablet Take 1 tablet by mouth once daily with breakfast  . ezetimibe (ZETIA) 10 MG tablet Take 1 tablet (10 mg total) by mouth daily. NEED OV.  Marland Kitchen gabapentin (NEURONTIN) 100 MG capsule 1 cap by mouth with 300 mg cap at bedtime and increase every 3 nights by 100 mg to max of 600 mg combined dose at bedtime  . gabapentin (NEURONTIN) 300 MG capsule TAKE 1 CAPSULE BY MOUTH  THREE TIMES DAILY  . glucose blood test strip Check sugars twice daily  . Liniments (SALONPAS PAIN RELIEF PATCH EX) Place 1 patch onto the skin daily as needed.   . metFORMIN (GLUCOPHAGE-XR) 500 MG 24 hr tablet TAKE 1 TABLET BY MOUTH TWICE DAILY WITH MEALS  . OVER THE COUNTER MEDICATION Take 1 tablet by mouth daily. Calcium  640m  and vitamin D3  . tiZANidine (ZANAFLEX) 4 MG tablet Take 4 mg by mouth 2 (two) times daily as needed.   Allergies  Allergen Reactions  . Lisinopril-Hydrochlorothiazide Other (See Comments)    Angioedema  - Tongue swelling   . Codeine Itching  . Oxycodone Itching  . Simvastatin Other (See Comments)    Muscle pain  . Tramadol Itching     Review of Systems    Objective:   BP 134/82 (BP Location: Left Arm, Patient Position: Sitting, Cuff Size: Normal)   Pulse 76   Resp 12   Ht '5\' 3"'  (1.6 m)   Wt 172 lb (78 kg)   BMI 30.47 kg/m   Physical Exam  NAD MS:  NT over cervical spinous processes.  Tender over left trap with tightness just above superior edge of scapula and tender over insertion of deltoid on proximal  lateral humerus. No erythema or swelling Tender over AC, CC and subacromial bursa. Unable to fully abduct or flex due to pain. Decreased strength against downward force with should abducted and internally rotated.  Pain with this maneuver as well. Neuro:  A & O x 3, CN  II-XII grossly intact.  Motor 5/5, DTRs 2+/4, sensory grossly normal to light touch.   Assessment & Plan   1.  Left shoulder pain and decreased mobility:  Multifactorial at this point.  Appears to have Trapezius muscle spasm and rotator cuff tendinitis.  Not clear if vaccine had anything to do with this.   Not clear if any definitive nerve entrapment syndrome Has Salon pas, diclofenac gel and tylenol.   Gabapentin changed to just the 300 mg caps with 1 in a.m., 1 midday and 2 at bedtime and discontinue the 100 mg caps as at goal dose at night. To try her Tizanidine 1/2 to 1  tab at bedtime for 3-5 days at night. Referral to PT Xray of cervical spine. Follow up in 3 months.

## 2019-06-11 ENCOUNTER — Ambulatory Visit
Admission: RE | Admit: 2019-06-11 | Discharge: 2019-06-11 | Disposition: A | Discharge status: Home / Self Care | Payer: Medicare HMO | Source: Ambulatory Visit | Attending: Internal Medicine | Admitting: Internal Medicine

## 2019-06-11 DIAGNOSIS — M25512 Pain in left shoulder: Secondary | ICD-10-CM

## 2019-06-12 ENCOUNTER — Other Ambulatory Visit: Payer: Self-pay

## 2019-06-18 ENCOUNTER — Ambulatory Visit: Payer: Medicare HMO

## 2019-06-24 ENCOUNTER — Other Ambulatory Visit: Payer: Self-pay

## 2019-06-24 ENCOUNTER — Encounter: Payer: Self-pay | Admitting: Rehabilitative and Restorative Service Providers"

## 2019-06-24 ENCOUNTER — Ambulatory Visit: Payer: Medicare HMO | Attending: Internal Medicine | Admitting: Rehabilitative and Restorative Service Providers"

## 2019-06-24 DIAGNOSIS — G8929 Other chronic pain: Secondary | ICD-10-CM | POA: Diagnosis present

## 2019-06-24 DIAGNOSIS — M6281 Muscle weakness (generalized): Secondary | ICD-10-CM | POA: Insufficient documentation

## 2019-06-24 DIAGNOSIS — M25512 Pain in left shoulder: Secondary | ICD-10-CM | POA: Insufficient documentation

## 2019-06-24 DIAGNOSIS — M542 Cervicalgia: Secondary | ICD-10-CM | POA: Insufficient documentation

## 2019-06-24 DIAGNOSIS — R293 Abnormal posture: Secondary | ICD-10-CM

## 2019-06-24 NOTE — Therapy (Signed)
[facility]patient [facility] [address] [place], [place], [address] Phone: [phone]   Fax:  [phone]  Physical Therapy Evaluation  Patient Details  Name: April Shaffer MRN: [id number] Date of Birth: 1950-09-15 Referring Provider (PT): Mulberry   Encounter Date: 06/24/2019  PT End of Session - 06/24/19 1818    Visit Number  1    Number of Visits  12    Date for PT Re-Evaluation  08/06/19    Authorization Type  [employer]    PT Start Time  [phone]    PT Stop Time  0600    PT Time Calculation (min)  68 min       Past Medical History:  Diagnosis Date  . Arthritis   . Carotid artery narrowing   . Coronary artery disease    Cardiac catheterization November 2013: 50% ostial LAD stenosis 50% mid stenosis. 30% disease in the left circumflex.  . Diabetes mellitus, type 2 (HCC)   . Hyperlipidemia   . Hypertension   . Onychomycosis of toenail 07/31/2016  . Sleep apnea       Had surgery to correct    Past Surgical History:  Procedure Laterality Date  . ABDOMINAL HYSTERECTOMY    . CARDIAC CATHETERIZATION    . ENDARTERECTOMY  09/27/2011   Procedure: RIGT ENDARTERECTOMY CAROTID;  Surgeon: Pryor Ochoa, MD;  Location: [facility] OR;  Service: Vascular;  Laterality: Right;  . EPIDURAL BLOCK INJECTION  02/2008   Drs. Dorthula Nettles  . gyn surgery  2004   total hysterectomy for mennorhagia,,salpingoophorectomy  . TONSILLECTOMY    . TOTAL ABDOMINAL HYSTERECTOMY W/ BILATERAL SALPINGOOPHORECTOMY Bilateral 2000  . TOTAL HIP ARTHROPLASTY Right 08/22/2018   Procedure: TOTAL HIP ARTHROPLASTY;  Surgeon: Frederico Hamman, MD;  Location: [facility];  Service: Orthopedics;  Laterality: Right;  Marland Kitchen VARICOSE VEIN SURGERY  2008   stripping    There were no vitals filed for this visit.   Subjective Assessment - 06/24/19 1707    Subjective  "My neck is tight and I am having numbness in both arms but the left one is worse. My fingers are numb on my left hand"     Limitations  Lifting;Other (comment)   dressing, doing hair, sweeping   How long can you sit comfortably?  > 1 hour    How long can you stand comfortably?  30-40 min    How long can you walk comfortably?  30 min    Patient Stated Goals  Stop my arm from hurting    Currently in Pain?  Yes    Pain Score  7     Pain Location  Shoulder    Pain Orientation  Left    Pain Descriptors / Indicators  Other (Comment);Numbness   stiff; fingers are cold   Pain Type  Chronic pain    Pain Radiating Towards  L shoulder to L hand    Pain Onset  More than a month ago    Pain Frequency  Constant    Aggravating Factors   sweeping, doing hair, sleeping, dressing    Pain Relieving Factors  gel that is over the counter, Biofreeze, over the counter medicine pain patch (Salonpas)    Effect of Pain on Daily Activities  can perform daily activities but hurts    Multiple Pain Sites  No         [facility] PT Assessment - 06/24/19 0001      Assessment   Medical Diagnosis  L shoulder  pain    R shoulder indicated in comments; referral for L shoulder   Referring Provider (PT)  Mulberry    Onset Date/Surgical Date  03/30/19   several months ago   Hand Dominance  Right    Next MD Visit  August 07, 2019      Precautions   Precautions  None      Restrictions   Weight Bearing Restrictions  No      Balance Screen   Has the patient fallen in the past 6 months  No    Has the patient had a decrease in activity level because of a fear of falling?   Yes   does not go anywhere she thinks she may fall   Is the patient reluctant to leave their home because of a fear of falling?   No      Home Environment   Living Environment  --   one level home     Prior Function   Level of Independence  Independent      Cognition   Overall Cognitive Status  Within Functional Limits for tasks assessed      Observation/Other Assessments   Observations  rubs L hand during assessment    Skin Integrity  --   intact   Focus on  Therapeutic Outcomes (FOTO)   intake 43% limitation      Sensation   Light Touch  --   decreased L glenohumeral joint sensation     Coordination   Gross Motor Movements are Fluid and Coordinated  Yes    Finger Nose Finger Test  --   intact and symmetrical     Posture/Postural Control   Posture Comments  rounded shoulders; R shoulder heigher; forward head      ROM / Strength   AROM / PROM / Strength  AROM;PROM;Strength      AROM   Overall AROM Comments  cervical AROM limited: bil rot 50%, ext 25%:, R lateral flex limited 75%, L limited 25%. Shoulder AROM flex present: R 125, L 122, abdct  R 122 and L 103, ER T1 L and T2 R; bil IR T9       PROM   Overall PROM Comments  L ER PROM 32 with tightness felt at end range of motion. Cervical PROM WNL      Strength   Overall Strength Comments  flex L 3+/5 and R 4-/5, abdct R 4+/5 and L 4-/5 limited by pain; ER R 4+/5 and L 3+/5. All L limited by pain      Flexibility   Soft Tissue Assessment /Muscle Length  --   increased tightness palpated in L pec minor     Palpation   Palpation comment  R scapula heigher, L pec tender, L Upper Trap/supraspinatus tender; R cervical shift in sititng; L SCM tighter L with tenderness reported      Special Tests   Other special tests  Lift off L -; Michel Bickers bil -; empty can + L. ULTT median nerve + L; Compression/distraction/spurlings -; roos -      Ambulation/Gait   Gait Comments  No assistive device used; steady with gait; wide base of support                Objective measurements completed on examination: See above findings.              PT Education - 06/24/19 1815    Education Details  Hep issued from HEP 2 go; patient  given written copy. supine chin tuck x 10 reps 2x/day with 3 sec hold; seated levator stretch x 3 reps 30 sec hold 2x/day; scap retract 10 reps 2x/day    Person(s) Educated  Patient    Methods  Explanation;Demonstration;Tactile cues;Verbal cues;Handout     Comprehension  Verbalized understanding;Returned demonstration       PT Short Term Goals - 06/24/19 1832      PT SHORT TERM GOAL #1   Title  Pt will be I with HEP to assist with improved mobility while at home performing ADLs    Baseline  issued at eval    Time  2    Period  Weeks    Status  New    Target Date  07/09/19      PT SHORT TERM GOAL #2   Title  Pt will report improved L cervical pain </= 5/10 daily performing ADLS    Baseline  7/10    Time  2    Period  Weeks    Status  New    Target Date  07/09/19      PT SHORT TERM GOAL #3   Title  Pt will report improved L UE/hand numbness to be present only 50% of the time with performing ADLs    Baseline  present 782% of the time    Time  2    Period  Weeks    Status  New    Target Date  07/09/19        PT Long Term Goals - 06/24/19 1834      PT LONG TERM GOAL #1   Title  Pt will be able to improve FOTO score to 32% limitation to demo greater functional mobility    Baseline  43%    Time  6    Period  Weeks    Status  New    Target Date  07/30/19      PT LONG TERM GOAL #2   Title  Pt will be able to demonstrate L shoulder AROM to 120 degrees to assist with dressing.    Baseline  103 degrees sitting    Time  6    Period  Weeks    Status  New    Target Date  07/30/19      PT LONG TERM GOAL #3   Title  Pt will be able to sweep with 75% less difficulty    Baseline  unable    Time  6    Period  Weeks    Status  New    Target Date  07/30/19      PT LONG TERM GOAL #4   Title  Pt will be able to curl her hair with 75% less difficulty    Baseline  unable    Time  6    Period  Weeks    Status  New    Target Date  07/30/19             Plan - 06/24/19 1826    Clinical Impression Statement  Pt presents to PT with complaints of L cervical pain extending from posterior cranium to Upper Trap and periscapular musculature extending into the hand with numbness in the L fingers 2-5. She also has similar  pattern on the R UE into the hand but with emphasis on the left. She is + for median nerve ULTT with all other special tests being negative. She has tightness to L pec major and minor, L  SCM, and tenderness to L supraspinatus, Upper Trap, SCM and pec. Pt has poor posture. Pt would benefit from PT for postural stretching and strengthening, L median nerve glides, cervical and shoulder flexibility exercises with L emphasis, and cervical/L UE AROM to assist with ADLs and functional mobility.    Personal Factors and Comorbidities  Age;Behavior Pattern    Examination-Activity Limitations  Bathing;Hygiene/Grooming;Lift;Reach Overhead;Carry;Dressing;Sleep    Examination-Participation Restrictions  Laundry;Cleaning;Community Activity    Stability/Clinical Decision Making  Evolving/Moderate complexity    Clinical Decision Making  Moderate    Rehab Potential  Good    PT Frequency  3x / week    PT Duration  6 weeks    PT Treatment/Interventions  Cryotherapy;Ultrasound;Traction;Moist Heat;Iontophoresis 4mg /ml Dexamethasone;Electrical Stimulation;Functional mobility training;Neuromuscular re-education;Therapeutic exercise;Therapeutic activities;Patient/family education;Manual techniques;Passive range of motion;Dry needling;Taping    PT Next Visit Plan  review HEP; do a quick Berg due to pt reluctance for all community  mobility due to fear of falling; US/soft tissue work to L Upper Trap/cervical spine; L median nerve gliding    PT Home Exercise Plan  see pt education/instruction for further details; chin tucks supine; levator stretch sitting; scap retract sitting    Consulted and Agree with Plan of Care  Patient       Patient will benefit from skilled therapeutic intervention in order to improve the following deficits and impairments:  Decreased mobility, Hypomobility, Increased muscle spasms, Impaired sensation, Improper body mechanics, Decreased range of motion, Decreased activity tolerance, Decreased strength,  Impaired flexibility, Impaired UE functional use, Postural dysfunction, Pain  Visit Diagnosis: Abnormal posture - Plan: PT plan of care cert/re-cert  Cervicalgia - Plan: PT plan of care cert/re-cert  Muscle weakness (generalized) - Plan: PT plan of care cert/re-cert  Chronic left shoulder pain - Plan: PT plan of care cert/re-cert     Problem List Patient Active Problem List   Diagnosis Date Noted  . Primary localized osteoarthritis of right hip 08/22/2018  . Carpal tunnel syndrome, bilateral 08/06/2018  . Type 2 diabetes mellitus with peripheral neuropathy (HCC) 08/06/2018  . Degenerative joint disease of right hip 08/06/2018  . Preoperative clearance 03/26/2018  . Upper back pain on left side 10/30/2016  . Encounter for postoperative carotid endarterectomy surveillance 12/07/2015  . Varicosities of leg 06/03/2015  . Acute left lumbar radiculopathy 04/15/2014  . Chronic sciatica of right side 03/17/2013  . Diabetic peripheral neuropathy (HCC) 03/17/2013  . Coronary artery disease   . Carotid artery stenosis s/p CEA 2013 10/16/2011  . Carotid artery occlusion without infarction, right 09/17/2011  . Carotid bruit 08/28/2011  . Gout 04/13/2011  . DM type 2 with diabetic peripheral neuropathy (HCC) 04/02/2008  . Hyperlipidemia associated with type 2 diabetes mellitus (HCC) 04/02/2008  . Essential hypertension 04/02/2008  . DEGENERATIVE DISC DISEASE, LUMBOSACRAL SPINE 04/02/2008    05/31/2008, PT, DPT 06/24/2019, 6:48 PM  [facility] [address] [place], [place], [place] Phone: [phone]   Fax:  [phone]  Name: April Shaffer MRN: [name] Date of Birth: 1950/06/23

## 2019-06-24 NOTE — Patient Instructions (Signed)
HEP issued; see patient education section. PT suggested pt use heating pad 2x/day after exercises for 15 min on comfortable setting. She states she does heating pad a couple times a week.

## 2019-07-06 ENCOUNTER — Ambulatory Visit: Payer: Medicare HMO | Attending: Internal Medicine | Admitting: Physical Therapy

## 2019-07-06 ENCOUNTER — Encounter: Payer: Self-pay | Admitting: Physical Therapy

## 2019-07-06 ENCOUNTER — Other Ambulatory Visit: Payer: Self-pay

## 2019-07-06 DIAGNOSIS — M25512 Pain in left shoulder: Secondary | ICD-10-CM | POA: Diagnosis present

## 2019-07-06 DIAGNOSIS — M25651 Stiffness of right hip, not elsewhere classified: Secondary | ICD-10-CM | POA: Diagnosis present

## 2019-07-06 DIAGNOSIS — M25511 Pain in right shoulder: Secondary | ICD-10-CM | POA: Diagnosis present

## 2019-07-06 DIAGNOSIS — M25551 Pain in right hip: Secondary | ICD-10-CM | POA: Insufficient documentation

## 2019-07-06 DIAGNOSIS — R293 Abnormal posture: Secondary | ICD-10-CM | POA: Insufficient documentation

## 2019-07-06 DIAGNOSIS — M542 Cervicalgia: Secondary | ICD-10-CM | POA: Diagnosis present

## 2019-07-06 DIAGNOSIS — G8929 Other chronic pain: Secondary | ICD-10-CM | POA: Insufficient documentation

## 2019-07-06 DIAGNOSIS — M6281 Muscle weakness (generalized): Secondary | ICD-10-CM | POA: Insufficient documentation

## 2019-07-06 DIAGNOSIS — Z96641 Presence of right artificial hip joint: Secondary | ICD-10-CM | POA: Insufficient documentation

## 2019-07-06 NOTE — Therapy (Signed)
[place], [place], [address] Phone: [phone]   Fax:  [phone]  Physical Therapy Treatment  Patient Details  Name: April Shaffer MRN: [id number] Date of Birth: April 27, 1950 Referring Provider (PT): Mulberry   Encounter Date: 07/06/2019  PT End of Session - 07/06/19 1709    Visit Number  2    Number of Visits  12    Date for PT Re-Evaluation  08/06/19    Authorization Type  [employer]    PT Start Time  1634    PT Stop Time  1723    PT Time Calculation (min)  49 min    Activity Tolerance  Patient tolerated treatment well    Behavior During Therapy  [facility] for tasks assessed/performed       Past Medical History:  Diagnosis Date  . Arthritis   . Carotid artery narrowing   . Coronary artery disease    Cardiac catheterization November 2013: 50% ostial LAD stenosis 50% mid stenosis. 30% disease in the left circumflex.  . Diabetes mellitus, type 2 ([place])   . Hyperlipidemia   . Hypertension   . Onychomycosis of toenail 07/31/2016  . Sleep apnea       Had surgery to correct    Past Surgical History:  Procedure Laterality Date  . ABDOMINAL HYSTERECTOMY    . CARDIAC CATHETERIZATION    . ENDARTERECTOMY  09/27/2011   Procedure: RIGT ENDARTERECTOMY CAROTID;  Surgeon: Mal Misty, MD;  Location: [place];  Service: Vascular;  Laterality: Right;  . EPIDURAL BLOCK INJECTION  02/2008   Drs. Eulis Manly  . gyn surgery  2004   total hysterectomy for mennorhagia,,salpingoophorectomy  . TONSILLECTOMY    . TOTAL ABDOMINAL HYSTERECTOMY W/ BILATERAL SALPINGOOPHORECTOMY Bilateral 2000  . TOTAL HIP ARTHROPLASTY Right 08/22/2018   Procedure: TOTAL HIP ARTHROPLASTY;  Surgeon: Earlie Server, MD;  Location: [facility];  Service: Orthopedics;  Laterality: Right;  Marland Kitchen VARICOSE VEIN SURGERY  2008   stripping    There were no vitals filed for this visit.  Subjective Assessment - 07/06/19 1638    Subjective   Doing ok with HEP, pain is not as bad today    Currently in Pain?  Yes    Pain Score  7     Pain Location  Neck    Pain Orientation  Left    Pain Descriptors / Indicators  Numbness;Tingling    Pain Type  Chronic pain                       [facility] Adult PT Treatment/Exercise - 07/06/19 0001      Exercises   Exercises  Shoulder      Shoulder Exercises: Supine   Other Supine Exercises  2x10 red band overhead pull, horizontal abduction and ER     Other Supine Exercises  10x3 sec head presses into pillow, shoulder presses, cervical rotation      Shoulder Exercises: ROM/Strengthening   UBE (Upper Arm Bike)  L1x4' Alt FWD/BWD      Modalities   Modalities  Moist Heat      Moist Heat Therapy   Number Minutes Moist Heat  12 Minutes    Moist Heat Location  Cervical      Manual Therapy   Manual Therapy  Joint mobilization;Soft tissue mobilization;Manual Traction    Joint Mobilization  grade II-III cervical mobs in supine with focus on Lt C3-4    Soft tissue mobilization  STM to bilat cervical paraspinals, upper traps , SCM and pecs    Manual Traction  gentle cervical traction             PT Education - 07/06/19 1647    Education Details  HEP progression    Person(s) Educated  Patient    Methods  Explanation;Demonstration;Handout    Comprehension  Returned demonstration;Verbalized understanding       PT Short Term Goals - 06/24/19 1832      PT SHORT TERM GOAL #1   Title  Pt will be I with HEP to assist with improved mobility while at home performing ADLs    Baseline  issued at eval    Time  2    Period  Weeks    Status  New    Target Date  07/09/19      PT SHORT TERM GOAL #2   Title  Pt will report improved L cervical pain </= 5/10 daily performing ADLS    Baseline  7/10    Time  2    Period  Weeks    Status  New    Target Date  07/09/19      PT SHORT TERM GOAL #3   Title  Pt will report improved L UE/hand numbness to be present only 50% of the  time with performing ADLs    Baseline  present 756% of the time    Time  2    Period  Weeks    Status  New    Target Date  07/09/19        PT Long Term Goals - 06/24/19 1834      PT LONG TERM GOAL #1   Title  Pt will be able to improve FOTO score to 32% limitation to demo greater functional mobility    Baseline  43%    Time  6    Period  Weeks    Status  New    Target Date  07/30/19      PT LONG TERM GOAL #2   Title  Pt will be able to demonstrate L shoulder AROM to 120 degrees to assist with dressing.    Baseline  103 degrees sitting    Time  6    Period  Weeks    Status  New    Target Date  07/30/19      PT LONG TERM GOAL #3   Title  Pt will be able to sweep with 75% less difficulty    Baseline  unable    Time  6    Period  Weeks    Status  New    Target Date  07/30/19      PT LONG TERM GOAL #4   Title  Pt will be able to curl her hair with 75% less difficulty    Baseline  unable    Time  6    Period  Weeks    Status  New    Target Date  07/30/19            Plan - 07/06/19 1713    Clinical Impression Statement  Haifa reported decreased pain and tightness following manual work today,  She had palpable muscle relaxing and improved mobility in the C spine.  She is still tight bilat upper traps and has postural changes placing stress on the neck and upper back musculature.  HEP progressed to add some shoulder/neck and upper back strengtheing in a supine position Pt declined  BERG as she stated she wasn't really afraid of falling she is just more carefull since having her hip replaced last year.    Rehab Potential  Good    PT Frequency  3x / week    PT Duration  6 weeks    PT Treatment/Interventions  Cryotherapy;Ultrasound;Traction;Moist Heat;Iontophoresis 4mg /ml Dexamethasone;Electrical Stimulation;Functional mobility training;Neuromuscular re-education;Therapeutic exercise;Therapeutic activities;Patient/family education;Manual techniques;Passive range of  motion;Dry needling;Taping    PT Next Visit Plan  cont manual work to cervical region, postural correction ex and modalities PRN    Consulted and Agree with Plan of Care  Patient       Patient will benefit from skilled therapeutic intervention in order to improve the following deficits and impairments:  Decreased mobility, Hypomobility, Increased muscle spasms, Impaired sensation, Improper body mechanics, Decreased range of motion, Decreased activity tolerance, Decreased strength, Impaired flexibility, Impaired UE functional use, Postural dysfunction, Pain  Visit Diagnosis: Abnormal posture  Cervicalgia  Muscle weakness (generalized)  Chronic left shoulder pain     Problem List Patient Active Problem List   Diagnosis Date Noted  . Primary localized osteoarthritis of right hip 08/22/2018  . Carpal tunnel syndrome, bilateral 08/06/2018  . Type 2 diabetes mellitus with peripheral neuropathy (HCC) 08/06/2018  . Degenerative joint disease of right hip 08/06/2018  . Preoperative clearance 03/26/2018  . Upper back pain on left side 10/30/2016  . Encounter for postoperative carotid endarterectomy surveillance 12/07/2015  . Varicosities of leg 06/03/2015  . Acute left lumbar radiculopathy 04/15/2014  . Chronic sciatica of right side 03/17/2013  . Diabetic peripheral neuropathy (HCC) 03/17/2013  . Coronary artery disease   . Carotid artery stenosis s/p CEA 2013 10/16/2011  . Carotid artery occlusion without infarction, right 09/17/2011  . Carotid bruit 08/28/2011  . Gout 04/13/2011  . DM type 2 with diabetic peripheral neuropathy (HCC) 04/02/2008  . Hyperlipidemia associated with type 2 diabetes mellitus (HCC) 04/02/2008  . Essential hypertension 04/02/2008  . DEGENERATIVE DISC DISEASE, LUMBOSACRAL SPINE 04/02/2008    05/31/2008 PT 07/06/2019, 5:16 PM  [facility] [address] [place], [place], [place] Phone: [phone]    Fax:  [phone]  Name: DANESHA KIRCHOFF MRN: [name] Date of Birth: June 01, 1950

## 2019-07-08 ENCOUNTER — Ambulatory Visit: Payer: Medicare HMO | Admitting: Rehabilitative and Restorative Service Providers"

## 2019-07-08 ENCOUNTER — Other Ambulatory Visit: Payer: Self-pay

## 2019-07-08 DIAGNOSIS — G8929 Other chronic pain: Secondary | ICD-10-CM

## 2019-07-08 DIAGNOSIS — Z96641 Presence of right artificial hip joint: Secondary | ICD-10-CM

## 2019-07-08 DIAGNOSIS — M25511 Pain in right shoulder: Secondary | ICD-10-CM

## 2019-07-08 DIAGNOSIS — R293 Abnormal posture: Secondary | ICD-10-CM | POA: Diagnosis not present

## 2019-07-08 DIAGNOSIS — M25651 Stiffness of right hip, not elsewhere classified: Secondary | ICD-10-CM

## 2019-07-08 DIAGNOSIS — M25551 Pain in right hip: Secondary | ICD-10-CM

## 2019-07-08 DIAGNOSIS — M6281 Muscle weakness (generalized): Secondary | ICD-10-CM

## 2019-07-08 DIAGNOSIS — M542 Cervicalgia: Secondary | ICD-10-CM

## 2019-07-08 NOTE — Therapy (Signed)
[facility] [address] [place], [place], [address] Phone: [phone]   Fax:  [phone]  Physical Therapy Treatment  Patient Details  Name: April Shaffer MRN: [id number] Date of Birth: March 24, 1950 Referring Provider (PT): [place]   Encounter Date: 07/08/2019  PT End of Session - 07/08/19 1712    Visit Number  3    Number of Visits  12    Date for PT Re-Evaluation  08/06/19    Authorization Type  [employer]    PT Start Time  0424    PT Stop Time  0524    PT Time Calculation (min)  60 min    Activity Tolerance  Patient tolerated treatment well    Behavior During Therapy  [facility] for tasks assessed/performed       Past Medical History:  Diagnosis Date  . Arthritis   . Carotid artery narrowing   . Coronary artery disease    Cardiac catheterization November 2013: 50% ostial LAD stenosis 50% mid stenosis. 30% disease in the left circumflex.  . Diabetes mellitus, type 2 (HCC)   . Hyperlipidemia   . Hypertension   . Onychomycosis of toenail 07/31/2016  . Sleep apnea       Had surgery to correct    Past Surgical History:  Procedure Laterality Date  . ABDOMINAL HYSTERECTOMY    . CARDIAC CATHETERIZATION    . ENDARTERECTOMY  09/27/2011   Procedure: RIGT ENDARTERECTOMY CAROTID;  Surgeon: Pryor Ochoa, MD;  Location: [facility] OR;  Service: Vascular;  Laterality: Right;  . EPIDURAL BLOCK INJECTION  02/2008   Drs. Dorthula Nettles  . gyn surgery  2004   total hysterectomy for mennorhagia,,salpingoophorectomy  . TONSILLECTOMY    . TOTAL ABDOMINAL HYSTERECTOMY W/ BILATERAL SALPINGOOPHORECTOMY Bilateral 2000  . TOTAL HIP ARTHROPLASTY Right 08/22/2018   Procedure: TOTAL HIP ARTHROPLASTY;  Surgeon: Frederico Hamman, MD;  Location: [facility] ORS;  Service: Orthopedics;  Laterality: Right;  Marland Kitchen VARICOSE VEIN SURGERY  2008   stripping    There were no vitals filed for this visit.  Subjective Assessment - 07/08/19 1624    Subjective   5-6/10 shoulder pain; but doing good.    Currently in Pain?  Yes    Pain Score  6     Pain Location  Shoulder    Pain Orientation  Left    Pain Descriptors / Indicators  Aching;Tingling    Pain Type  Chronic pain    Pain Radiating Towards  L shoulder to L hand    Pain Onset  More than a month ago    Pain Frequency  Constant                        [facility] Adult PT Treatment/Exercise - 07/08/19 0001      Shoulder Exercises: Supine   Other Supine Exercises  supine shoulder retract x 15 bil; bil shoulder depression x 15; bil elbow ext isometric x 20 with 5 sec hold; RTB bil ER x 20; chest pull RTB x 20, hip pull RTB x 15, diagonoal pull RTB x 15 each; supine chin tuck with 2 flat pillows x 15; iso chin tuck with bil shoulder flex/ext x 20; pec stretch bil 3x30 sec; RTB alternating hip/chest/overhead pull x 15 with iso chin tuck      Shoulder Exercises: Seated   Other Seated Exercises  scap retract x 20; cervical flex 2x30 sec pt self directed; levator stretch 2x30 sec bil  Moist Heat Therapy   Number Minutes Moist Heat  12 Minutes    Moist Heat Location  Cervical      Manual Therapy   Soft tissue mobilization  STW/trigger point to L Upper Trap/Rhomboid/cervical paraspinals in sitting; had patient stretch during some of STW               PT Short Term Goals - 07/08/19 1637      PT SHORT TERM GOAL #1   Title  Pt will be I with HEP to assist with improved mobility while at home performing ADLs    Baseline  issued at eval    Time  2    Period  Weeks    Status  On-going    Target Date  07/09/19      PT SHORT TERM GOAL #2   Title  Pt will report improved L cervical pain </= 5/10 daily performing ADLS    Time  2    Period  Weeks    Status  On-going    Target Date  07/09/19      PT SHORT TERM GOAL #3   Title  Pt will report improved L UE/hand numbness to be present only 50% of the time with performing ADLs    Time  2    Period  Weeks    Status   On-going    Target Date  07/09/19        PT Long Term Goals - 06/24/19 1834      PT LONG TERM GOAL #1   Title  Pt will be able to improve FOTO score to 32% limitation to demo greater functional mobility    Baseline  43%    Time  6    Period  Weeks    Status  New    Target Date  07/30/19      PT LONG TERM GOAL #2   Title  Pt will be able to demonstrate L shoulder AROM to 120 degrees to assist with dressing.    Baseline  103 degrees sitting    Time  6    Period  Weeks    Status  New    Target Date  07/30/19      PT LONG TERM GOAL #3   Title  Pt will be able to sweep with 75% less difficulty    Baseline  unable    Time  6    Period  Weeks    Status  New    Target Date  07/30/19      PT LONG TERM GOAL #4   Title  Pt will be able to curl her hair with 75% less difficulty    Baseline  unable    Time  6    Period  Weeks    Status  New    Target Date  07/30/19            Plan - 07/08/19 1633    Clinical Impression Statement  Patient reports decreased pain in her shoulder prior to treatment. Treatment focused on scapular stability exercises within a painfree range of motion. Manual therapy performed to decrease L cervical tension and periscapular muscle tightness. She would continue to benefit from PT for scapular strengthening to assist with decreased shoulder pain with functional mobility.    Rehab Potential  Good    PT Frequency  3x / week    PT Duration  6 weeks    PT Treatment/Interventions  Cryotherapy;Ultrasound;Traction;Moist Heat;Iontophoresis 4mg /ml  Dexamethasone;Electrical Stimulation;Functional mobility training;Neuromuscular re-education;Therapeutic exercise;Therapeutic activities;Patient/family education;Manual techniques;Passive range of motion;Dry needling;Taping    PT Next Visit Plan  continue scapular strengthening as long as patient's pain continues to decrease; continue manual therapy as needed    Consulted and Agree with Plan of Care  Patient        Patient will benefit from skilled therapeutic intervention in order to improve the following deficits and impairments:  Decreased mobility, Hypomobility, Increased muscle spasms, Impaired sensation, Improper body mechanics, Decreased range of motion, Decreased activity tolerance, Decreased strength, Impaired flexibility, Impaired UE functional use, Postural dysfunction, Pain  Visit Diagnosis: Abnormal posture  Cervicalgia  Muscle weakness (generalized)  Chronic left shoulder pain  Pain in right hip  Acute pain of right shoulder  Stiffness of right hip, not elsewhere classified  History of total hip arthroplasty, right     Problem List Patient Active Problem List   Diagnosis Date Noted  . Primary localized osteoarthritis of right hip 08/22/2018  . Carpal tunnel syndrome, bilateral 08/06/2018  . Type 2 diabetes mellitus with peripheral neuropathy (HCC) 08/06/2018  . Degenerative joint disease of right hip 08/06/2018  . Preoperative clearance 03/26/2018  . Upper back pain on left side 10/30/2016  . Encounter for postoperative carotid endarterectomy surveillance 12/07/2015  . Varicosities of leg 06/03/2015  . Acute left lumbar radiculopathy 04/15/2014  . Chronic sciatica of right side 03/17/2013  . Diabetic peripheral neuropathy (HCC) 03/17/2013  . Coronary artery disease   . Carotid artery stenosis s/p CEA 2013 10/16/2011  . Carotid artery occlusion without infarction, right 09/17/2011  . Carotid bruit 08/28/2011  . Gout 04/13/2011  . DM type 2 with diabetic peripheral neuropathy (HCC) 04/02/2008  . Hyperlipidemia associated with type 2 diabetes mellitus (HCC) 04/02/2008  . Essential hypertension 04/02/2008  . DEGENERATIVE DISC DISEASE, LUMBOSACRAL SPINE 04/02/2008    Thornell Sartorius, PT 07/08/2019, 5:35 PM  [facility] [address] [place], [place], [address] Phone: [phone]   Fax:  [phone]  Name: April Shaffer MRN: [id number] Date of Birth: Oct 16, 1950

## 2019-07-09 ENCOUNTER — Encounter: Payer: Self-pay | Admitting: Physical Therapy

## 2019-07-09 ENCOUNTER — Other Ambulatory Visit: Payer: Self-pay

## 2019-07-09 ENCOUNTER — Ambulatory Visit: Payer: Medicare HMO | Admitting: Physical Therapy

## 2019-07-09 DIAGNOSIS — M6281 Muscle weakness (generalized): Secondary | ICD-10-CM

## 2019-07-09 DIAGNOSIS — R293 Abnormal posture: Secondary | ICD-10-CM | POA: Diagnosis not present

## 2019-07-09 DIAGNOSIS — M1A0710 Idiopathic chronic gout, right ankle and foot, without tophus (tophi): Secondary | ICD-10-CM

## 2019-07-09 DIAGNOSIS — G8929 Other chronic pain: Secondary | ICD-10-CM

## 2019-07-09 DIAGNOSIS — M542 Cervicalgia: Secondary | ICD-10-CM

## 2019-07-09 DIAGNOSIS — E1142 Type 2 diabetes mellitus with diabetic polyneuropathy: Secondary | ICD-10-CM

## 2019-07-09 MED ORDER — AMLODIPINE BESYLATE 10 MG PO TABS: 10.0000 mg | ORAL_TABLET | Freq: Every day | ORAL | 3 refills | Status: DC

## 2019-07-09 MED ORDER — ATORVASTATIN CALCIUM 80 MG PO TABS: ORAL_TABLET | ORAL | 3 refills | Status: DC

## 2019-07-09 MED ORDER — CARVEDILOL PHOSPHATE ER 20 MG PO CP24: 20.0000 mg | ORAL_CAPSULE | Freq: Every day | ORAL | 3 refills | Status: DC

## 2019-07-09 MED ORDER — METFORMIN HCL ER 500 MG PO TB24: ORAL_TABLET | ORAL | 3 refills | Status: DC

## 2019-07-09 MED ORDER — ALLOPURINOL 300 MG PO TABS: 300.0000 mg | ORAL_TABLET | Freq: Every day | ORAL | 3 refills | Status: DC

## 2019-07-09 NOTE — Therapy (Signed)
[facility] Outpatient Rehabilitation [facility] [address] [place], [place], [address] Phone: [phone]   Fax:  [phone]  Physical Therapy Treatment  Patient Details  Name: April Shaffer MRN: [id number] Date of Birth: 01-26-1951 Referring Provider (PT): [place]   Encounter Date: 07/09/2019  PT End of Session - 07/09/19 1626    Visit Number  4    Number of Visits  12    Date for PT Re-Evaluation  08/06/19    Authorization Type  [employer]    Authorization Time Period  approved 18v, 06/24/2019-08/05/2019    Authorization - Visit Number  4    Authorization - Number of Visits  18    PT Start Time  1625   in late   PT Stop Time  1708   heat at end   PT Time Calculation (min)  43 min       Past Medical History:  Diagnosis Date  . Arthritis   . Carotid artery narrowing   . Coronary artery disease    Cardiac catheterization November 2013: 50% ostial LAD stenosis 50% mid stenosis. 30% disease in the left circumflex.  . Diabetes mellitus, type 2 (HCC)   . Hyperlipidemia   . Hypertension   . Onychomycosis of toenail 07/31/2016  . Sleep apnea       Had surgery to correct    Past Surgical History:  Procedure Laterality Date  . ABDOMINAL HYSTERECTOMY    . CARDIAC CATHETERIZATION    . ENDARTERECTOMY  09/27/2011   Procedure: RIGT ENDARTERECTOMY CAROTID;  Surgeon: Pryor Ochoa, MD;  Location: [facility] OR;  Service: Vascular;  Laterality: Right;  . EPIDURAL BLOCK INJECTION  02/2008   Drs. Dorthula Nettles  . gyn surgery  2004   total hysterectomy for mennorhagia,,salpingoophorectomy  . TONSILLECTOMY    . TOTAL ABDOMINAL HYSTERECTOMY W/ BILATERAL SALPINGOOPHORECTOMY Bilateral 2000  . TOTAL HIP ARTHROPLASTY Right 08/22/2018   Procedure: TOTAL HIP ARTHROPLASTY;  Surgeon: Frederico Hamman, MD;  Location: [facility];  Service: Orthopedics;  Laterality: Right;  Marland Kitchen VARICOSE VEIN SURGERY  2008   stripping    There were no vitals filed for this visit.  Subjective  Assessment - 07/09/19 1627    Subjective  Pt reports her hip is bothering her so much this week, not sure why - its the one that was replaced.  Also states that her neck is sore but better after yesterdays tx and now she is sore in the outsdie of the arm ( delotid Lt )    Patient Stated Goals  Stop my arm from hurting    Currently in Pain?  Yes    Pain Score  5     Pain Location  Arm    Pain Orientation  Left;Lateral    Pain Descriptors / Indicators  Aching                        [facility] Adult PT Treatment/Exercise - 07/09/19 0001      Shoulder Exercises: Standing   External Rotation  Strengthening;Both;20 reps;Theraband    Theraband Level (Shoulder External Rotation)  Level 2 (Red)    External Rotation Limitations  towel under elbow    Extension  Strengthening;Both;20 reps;Theraband   with scap retraction and core engagement   Theraband Level (Shoulder Extension)  Level 2 (Red)      Shoulder Exercises: ROM/Strengthening   UBE (Upper Arm Bike)  L1x4' Alt FWD/BWD      Shoulder Exercises: Stretch  Other Shoulder Stretches  doorway stretch low and mid      Moist Heat Therapy   Number Minutes Moist Heat  12 Minutes    Moist Heat Location  Cervical   seated     Manual Therapy   Soft tissue mobilization  STM and TPR to Lt deltoid and Rt upper trap/levator seated.                PT Short Term Goals - 07/08/19 1637      PT SHORT TERM GOAL #1   Title  Pt will be I with HEP to assist with improved mobility while at home performing ADLs    Baseline  issued at eval    Time  2    Period  Weeks    Status  On-going    Target Date  07/09/19      PT SHORT TERM GOAL #2   Title  Pt will report improved L cervical pain </= 5/10 daily performing ADLS    Time  2    Period  Weeks    Status  On-going    Target Date  07/09/19      PT SHORT TERM GOAL #3   Title  Pt will report improved L UE/hand numbness to be present only 50% of the time with performing ADLs     Time  2    Period  Weeks    Status  On-going    Target Date  07/09/19        PT Long Term Goals - 06/24/19 1834      PT LONG TERM GOAL #1   Title  Pt will be able to improve FOTO score to 32% limitation to demo greater functional mobility    Baseline  43%    Time  6    Period  Weeks    Status  New    Target Date  07/30/19      PT LONG TERM GOAL #2   Title  Pt will be able to demonstrate L shoulder AROM to 120 degrees to assist with dressing.    Baseline  103 degrees sitting    Time  6    Period  Weeks    Status  New    Target Date  07/30/19      PT LONG TERM GOAL #3   Title  Pt will be able to sweep with 75% less difficulty    Baseline  unable    Time  6    Period  Weeks    Status  New    Target Date  07/30/19      PT LONG TERM GOAL #4   Title  Pt will be able to curl her hair with 75% less difficulty    Baseline  unable    Time  6    Period  Weeks    Status  New    Target Date  07/30/19            Plan - 07/09/19 1728    Clinical Impression Statement  Pt is making progress with decreased neck/shoulder pain.  Currently her hip is bothering her more than anything else.  Decreased palpable tightness in her cervical muscles, had a lot of trigger points in the Lt deltoid .    Rehab Potential  Good    PT Frequency  3x / week    PT Duration  6 weeks    PT Treatment/Interventions  Cryotherapy;Ultrasound;Traction;Moist Heat;Iontophoresis 4mg /ml Dexamethasone;Electrical Stimulation;Functional mobility  training;Neuromuscular re-education;Therapeutic exercise;Therapeutic activities;Patient/family education;Manual techniques;Passive range of motion;Dry needling;Taping    PT Next Visit Plan  continue scapular strengthening as long as patient's pain continues to decrease; continue manual therapy as needed    Consulted and Agree with Plan of Care  Patient       Patient will benefit from skilled therapeutic intervention in order to improve the following deficits and  impairments:  Decreased mobility, Hypomobility, Increased muscle spasms, Impaired sensation, Improper body mechanics, Decreased range of motion, Decreased activity tolerance, Decreased strength, Impaired flexibility, Impaired UE functional use, Postural dysfunction, Pain  Visit Diagnosis: Abnormal posture  Cervicalgia  Muscle weakness (generalized)  Chronic left shoulder pain     Problem List Patient Active Problem List   Diagnosis Date Noted  . Primary localized osteoarthritis of right hip 08/22/2018  . Carpal tunnel syndrome, bilateral 08/06/2018  . Type 2 diabetes mellitus with peripheral neuropathy ([place]) 08/06/2018  . Degenerative joint disease of right hip 08/06/2018  . Preoperative clearance 03/26/2018  . Upper back pain on left side 10/30/2016  . Encounter for postoperative carotid endarterectomy surveillance 12/07/2015  . Varicosities of leg 06/03/2015  . Acute left lumbar radiculopathy 04/15/2014  . Chronic sciatica of right side 03/17/2013  . Diabetic peripheral neuropathy ([place]) 03/17/2013  . Coronary artery disease   . Carotid artery stenosis s/p CEA 2013 10/16/2011  . Carotid artery occlusion without infarction, right 09/17/2011  . Carotid bruit 08/28/2011  . Gout 04/13/2011  . DM type 2 with diabetic peripheral neuropathy ([place]) 04/02/2008  . Hyperlipidemia associated with type 2 diabetes mellitus ([place]) 04/02/2008  . Essential hypertension 04/02/2008  . [place] DISEASE, LUMBOSACRAL SPINE 04/02/2008    Jeral Pinch PT  07/09/2019, 5:30 PM  [facility] [address] [place], [place], [address] Phone: [phone]   Fax:  [phone]  Name: April Shaffer MRN: [id number] Date of Birth: November 12, 1950

## 2019-07-11 ENCOUNTER — Encounter (HOSPITAL_COMMUNITY): Payer: Self-pay | Admitting: *Deleted

## 2019-07-11 ENCOUNTER — Ambulatory Visit (HOSPITAL_COMMUNITY)
Admission: EM | Admit: 2019-07-11 | Discharge: 2019-07-11 | Disposition: A | Discharge status: Home / Self Care | Payer: Medicare HMO | Attending: Emergency Medicine | Admitting: Emergency Medicine

## 2019-07-11 ENCOUNTER — Other Ambulatory Visit: Payer: Self-pay

## 2019-07-11 DIAGNOSIS — M25551 Pain in right hip: Secondary | ICD-10-CM | POA: Diagnosis not present

## 2019-07-11 DIAGNOSIS — M5441 Lumbago with sciatica, right side: Secondary | ICD-10-CM

## 2019-07-11 MED ORDER — PREDNISONE 20 MG PO TABS
20.0000 mg | ORAL_TABLET | Freq: Every day | ORAL | 0 refills | Status: AC
Start: 2019-07-11 — End: 2019-07-18

## 2019-07-11 MED ORDER — TRAMADOL HCL 50 MG PO TABS: 50.0000 mg | ORAL_TABLET | Freq: Four times a day (QID) | ORAL | 0 refills | Status: DC | PRN

## 2019-07-11 NOTE — ED Provider Notes (Signed)
[facility]    CSN: [id number] Arrival date & time: 07/11/19  1018      History   Chief Complaint Chief Complaint  Patient presents with  . Hip Pain    HPI April Shaffer is a 69 y.o. female history of hypertension, hyperlipidemia, DM type II, CAD, presenting today for evaluation of right hip pain.  Patient notes over the past week she has had worsening pain to her lower back wrapping around into her right hip and groin.  She reports prior hip replacement approximately 1 year ago.  She has not had significant issues with this area since.  Denies any new injury fall or trauma.  Has been using Tylenol without relief.  Is on Plavix.  Denies numbness or tingling.  Denies changes in bowel or bladder control.  Has plans to follow-up with orthopedics on Monday.  HPI  Past Medical History:  Diagnosis Date  . Arthritis   . Carotid artery narrowing   . Coronary artery disease    Cardiac catheterization November 2013: 50% ostial LAD stenosis 50% mid stenosis. 30% disease in the left circumflex.  . Diabetes mellitus, type 2 ([place])   . Hyperlipidemia   . Hypertension   . Onychomycosis of toenail 07/31/2016  . Sleep apnea       Had surgery to correct    Patient Active Problem List   Diagnosis Date Noted  . Primary localized osteoarthritis of right hip 08/22/2018  . Carpal tunnel syndrome, bilateral 08/06/2018  . Type 2 diabetes mellitus with peripheral neuropathy ([place]) 08/06/2018  . Degenerative joint disease of right hip 08/06/2018  . Preoperative clearance 03/26/2018  . Upper back pain on left side 10/30/2016  . Encounter for postoperative carotid endarterectomy surveillance 12/07/2015  . Varicosities of leg 06/03/2015  . Acute left lumbar radiculopathy 04/15/2014  . Chronic sciatica of right side 03/17/2013  . Diabetic peripheral neuropathy ([place]) 03/17/2013  . Coronary artery disease   . Carotid artery stenosis s/p CEA 2013 10/16/2011  . Carotid artery occlusion  without infarction, right 09/17/2011  . Carotid bruit 08/28/2011  . Gout 04/13/2011  . DM type 2 with diabetic peripheral neuropathy ([place]) 04/02/2008  . Hyperlipidemia associated with type 2 diabetes mellitus ([place]) 04/02/2008  . Essential hypertension 04/02/2008  . [place] DISEASE, LUMBOSACRAL SPINE 04/02/2008    Past Surgical History:  Procedure Laterality Date  . ABDOMINAL HYSTERECTOMY    . CARDIAC CATHETERIZATION    . ENDARTERECTOMY  09/27/2011   Procedure: RIGT ENDARTERECTOMY CAROTID;  Surgeon: Mal Misty, MD;  Location: [facility];  Service: Vascular;  Laterality: Right;  . EPIDURAL BLOCK INJECTION  02/2008   Drs. Eulis Manly  . gyn surgery  2004   total hysterectomy for mennorhagia,,salpingoophorectomy  . JOINT REPLACEMENT    . TONSILLECTOMY    . TOTAL ABDOMINAL HYSTERECTOMY W/ BILATERAL SALPINGOOPHORECTOMY Bilateral 2000  . TOTAL HIP ARTHROPLASTY Right 08/22/2018   Procedure: TOTAL HIP ARTHROPLASTY;  Surgeon: Earlie Server, MD;  Location: [facility];  Service: Orthopedics;  Laterality: Right;  Marland Kitchen VARICOSE VEIN SURGERY  2008   stripping    OB History    Gravida  2   Para  2   Term  2   Preterm      AB      Living  2     SAB      TAB      Ectopic      Multiple      Live Births  Home Medications    Prior to Admission medications   Medication Sig Start Date End Date Taking? Authorizing Provider  allopurinol (ZYLOPRIM) 300 MG tablet Take 1 tablet (300 mg total) by mouth daily. 07/09/19  Yes Mack Hook, MD  amLODipine (NORVASC) 10 MG tablet Take 1 tablet (10 mg total) by mouth daily. 07/09/19  Yes Mack Hook, MD  aspirin EC 81 MG tablet Take 81 mg by mouth daily.   Yes [provider]  atorvastatin (LIPITOR) 80 MG tablet TAKE 1 TABLET BY MOUTH ONCE DAILY WITH  EVENING  MEAL 07/09/19  Yes Mack Hook, MD  Calcium Carb-Cholecalciferol (CALCIUM 500 + D3 PO) Take 1 Units by mouth 2 (two) times  daily.   Yes [provider]  carvedilol (COREG CR) 20 MG 24 hr capsule Take 1 capsule (20 mg total) by mouth daily. 07/09/19  Yes Mack Hook, MD  clopidogrel (PLAVIX) 75 MG tablet Take 1 tablet by mouth once daily with breakfast 06/09/19  Yes Mack Hook, MD  cyclobenzaprine (FLEXERIL) 5 MG tablet Take 1 tablet (5 mg total) by mouth 3 (three) times daily as needed for muscle spasms. 06/11/17  Yes Mack Hook, MD  ezetimibe (ZETIA) 10 MG tablet Take 1 tablet (10 mg total) by mouth daily. NEED OV. 06/08/19  Yes Lorretta Harp, MD  gabapentin (NEURONTIN) 300 MG capsule 1 cap by mouth in morning, 1 cap by mouth midday and 2 caps by mouth before bedtime. 06/10/19  Yes Mack Hook, MD  metFORMIN (GLUCOPHAGE-XR) 500 MG 24 hr tablet TAKE 1 TABLET BY MOUTH TWICE DAILY WITH MEALS 07/09/19  Yes Mack Hook, MD  omega-3 acid ethyl esters (LOVAZA) 1 g capsule Take 1 capsule (1 g total) by mouth 2 (two) times daily. 12/17/17  Yes Mack Hook, MD  acetaminophen (TYLENOL) 650 MG CR tablet Take 1,300 mg by mouth daily.    [provider]  blood glucose meter kit and supplies KIT Check sugars twice daily 06/10/18   Mack Hook, MD  Colchicine (MITIGARE) 0.6 MG CAPS 1 cap by mouth twice daily as needed for gout flare and hold Atorvastatin on days taking Mitigare Patient not taking: Reported on 06/10/2019 06/04/19   Mack Hook, MD  glucose blood test strip Check sugars twice daily 01/31/16   Mack Hook, MD  [facility] PAIN RELIEF PATCH EX) Place 1 patch onto the skin daily as needed.     [provider]  predniSONE (DELTASONE) 20 MG tablet Take 1 tablet (20 mg total) by mouth daily with breakfast for 7 days. 07/11/19 07/18/19  Navarro Nine C, PA-C  traMADol (ULTRAM) 50 MG tablet Take 1 tablet (50 mg total) by mouth every 6 (six) hours as needed for severe pain. 07/11/19   Skylynn Burkley C, PA-C  fexofenadine  (ALLEGRA) 180 MG tablet Take 1 tablet (180 mg total) by mouth daily. Patient not taking: Reported on 06/10/2019 06/17/17 07/11/19  Mack Hook, MD    Family History Family History  Problem Relation Age of Onset  . Hypertension Sister   . Hypothyroidism Sister   . Cancer Maternal Grandmother        lung  . Hypertension Son   . Hypertension Sister   . Diabetes Brother        lost toe in 2018 with new diagnosis of DM  . Hypertension Son     Social History Social History   Tobacco Use  . Smoking status: Former Smoker    Packs/day: 0.10    Years: 25.00  Pack years: 2.50    Types: Cigarettes    Start date: 05/31/1968    Quit date: 07/07/2019    Years since quitting: 0.0  . Smokeless tobacco: Never Used  . Tobacco comment: pt states she smokes about 4-5 cigs per --states she contiues to work on it.  No interest in other help now.  Substance Use Topics  . Alcohol use: Not Currently  . Drug use: No     Allergies   Lisinopril-hydrochlorothiazide, Codeine, Oxycodone, Simvastatin, and Tramadol   Review of Systems Review of Systems  Constitutional: Negative for fatigue and fever.  Eyes: Negative for visual disturbance.  Respiratory: Negative for shortness of breath.   Cardiovascular: Negative for chest pain.  Gastrointestinal: Negative for abdominal pain, nausea and vomiting.  Musculoskeletal: Positive for arthralgias, gait problem and myalgias. Negative for joint swelling.  Skin: Negative for color change, rash and wound.  Neurological: Negative for dizziness, weakness, light-headedness and headaches.     Physical Exam Triage Vital Signs ED Triage Vitals  Enc Vitals Group     BP 07/11/19 1059 (!) 184/86     Pulse Rate 07/11/19 1059 82     Resp 07/11/19 1059 18     Temp 07/11/19 1059 98.2 F (36.8 C)     Temp Source 07/11/19 1059 Oral     SpO2 07/11/19 1059 100 %     Weight --      Height --      Head Circumference --      Peak Flow --      Pain Score  07/11/19 1111 10     Pain Loc --      Pain Edu? --      Excl. in [place]? --    No data found.  Updated Vital Signs BP (!) 184/86 (BP Location: Left Arm)   Pulse 82   Temp 98.2 F (36.8 C) (Oral)   Resp 18   SpO2 100%   Visual Acuity Right Eye Distance:   Left Eye Distance:   Bilateral Distance:    Right Eye Near:   Left Eye Near:    Bilateral Near:     Physical Exam Vitals and nursing note reviewed.  Constitutional:      Appearance: She is well-developed.     Comments: No acute distress  HENT:     Head: Normocephalic and atraumatic.     Nose: Nose normal.  Eyes:     Conjunctiva/sclera: Conjunctivae normal.  Cardiovascular:     Rate and Rhythm: Normal rate.  Pulmonary:     Effort: Pulmonary effort is normal. No respiratory distress.  Abdominal:     General: There is no distension.  Musculoskeletal:        General: Normal range of motion.     Cervical back: Neck supple.     Comments: Back: Nontender to palpation of lumbar spine midline, tenderness to palpation to right lateral upper gluteal area extending into right lateral hip and groin area and proximal lateral thigh  Strength at hips and knees 5/5 and equal bilaterally  Skin:    General: Skin is warm and dry.     Comments: No overlying erythema or rash, incision from arthroplasty well-healed without any erythema around the area  Neurological:     Mental Status: She is alert and oriented to person, place, and time.      UC Treatments / Results  Labs (all labs ordered are listed, but only abnormal results are displayed) Labs Reviewed - No data  to display  EKG   Radiology No results found.  Procedures Procedures (including critical care time)  Medications Ordered in UC Medications - No data to display  Initial Impression / Assessment and Plan / UC Course  I have reviewed the triage vital signs and the nursing notes.  Pertinent labs & imaging results that were available during my care of the patient  were reviewed by me and considered in my medical decision making (see chart for details).     Suspect most likely low back pain with radicular distribution versus inflammatory hip pain.  No mechanism of injury, deferring imaging.  On Plavix, will avoid NSAIDs.  Placing on 20 mg prednisone daily, tramadol for severe pain.  Has itching with tramadol, can tolerate with Benadryl.  Follow-up with orthopedics as planned.  No red flags for cauda equina.  Discussed strict return precautions. Patient verbalized understanding and is agreeable with plan.  Final Clinical Impressions(s) / UC Diagnoses   Final diagnoses:  Acute right-sided low back pain with right-sided sciatica  Right hip pain     Discharge Instructions     Begin prednisone 20 mg daily for the next week May apply diclofenac/Voltaren cream topically Tramadol for severe pain Follow-up with orthopedics as planned on Monday  Please follow-up if any symptoms not improving worsening or changing, developing any issues with urination/bowel movements, weakness in legs   ED Prescriptions    Medication Sig Dispense Auth. Provider   predniSONE (DELTASONE) 20 MG tablet Take 1 tablet (20 mg total) by mouth daily with breakfast for 7 days. 7 tablet Lajarvis Italiano C, PA-C   traMADol (ULTRAM) 50 MG tablet Take 1 tablet (50 mg total) by mouth every 6 (six) hours as needed for severe pain. 12 tablet Christianne Zacher, [place] C, PA-C     I have reviewed the PDMP during this encounter.   Naina Sleeper, [place] C, PA-C 07/11/19 1254

## 2019-07-11 NOTE — Discharge Instructions (Signed)
Begin prednisone 20 mg daily for the next week May apply diclofenac/Voltaren cream topically Tramadol for severe pain Follow-up with orthopedics as planned on Monday  Please follow-up if any symptoms not improving worsening or changing, developing any issues with urination/bowel movements, weakness in legs

## 2019-07-11 NOTE — ED Triage Notes (Signed)
Denies injury.  C/O right posterior hip pain radiating around to anterior right hip, onset 1 wk ago with progressive worsening.  C/O painful ambulation and movement.

## 2019-07-14 ENCOUNTER — Other Ambulatory Visit: Payer: Self-pay

## 2019-07-14 ENCOUNTER — Ambulatory Visit: Payer: Medicare HMO | Admitting: Physical Therapy

## 2019-07-14 DIAGNOSIS — R293 Abnormal posture: Secondary | ICD-10-CM | POA: Diagnosis not present

## 2019-07-14 DIAGNOSIS — M542 Cervicalgia: Secondary | ICD-10-CM

## 2019-07-14 DIAGNOSIS — M6281 Muscle weakness (generalized): Secondary | ICD-10-CM

## 2019-07-14 DIAGNOSIS — M25512 Pain in left shoulder: Secondary | ICD-10-CM

## 2019-07-14 NOTE — Therapy (Signed)
April Shaffer, [place], [address] Phone: [phone]   Fax:  [phone]  Physical Therapy Treatment  Patient Details  Name: April Shaffer MRN: [id number] Date of Birth: [birth year] Referring Provider (PT): [place]   Encounter Date: 07/14/2019  PT End of Session - 07/14/19 1621    Visit Number  5    Number of Visits  12    Date for PT Re-Evaluation  08/06/19    Authorization Type  [employer]    Authorization Time Period  approved 18v, 06/24/2019-08/05/2019    Authorization - Visit Number  5    Authorization - Number of Visits  18    PT Start Time  2229    PT Stop Time  1711   heat at end   PT Time Calculation (min)  50 min    Activity Tolerance  Patient tolerated treatment well    Behavior During Therapy  [facility] for tasks assessed/performed       Past Medical History:  Diagnosis Date  . Arthritis   . Carotid artery narrowing   . Coronary artery disease    Cardiac catheterization November 2013: 50% ostial LAD stenosis 50% mid stenosis. 30% disease in the left circumflex.  . Diabetes mellitus, type 2 ([place])   . Hyperlipidemia   . Hypertension   . Onychomycosis of toenail 07/31/2016  . Sleep apnea       Had surgery to correct    Past Surgical History:  Procedure Laterality Date  . ABDOMINAL HYSTERECTOMY    . CARDIAC CATHETERIZATION    . ENDARTERECTOMY  09/27/2011   Procedure: RIGT ENDARTERECTOMY CAROTID;  Surgeon: Mal Misty, MD;  Location: [facility];  Service: Vascular;  Laterality: Right;  . EPIDURAL BLOCK INJECTION  02/2008   Drs. Eulis Manly  . gyn surgery  2004   total hysterectomy for mennorhagia,,salpingoophorectomy  . JOINT REPLACEMENT    . TONSILLECTOMY    . TOTAL ABDOMINAL HYSTERECTOMY W/ BILATERAL SALPINGOOPHORECTOMY Bilateral 2000  . TOTAL HIP ARTHROPLASTY Right 08/22/2018   Procedure: TOTAL HIP ARTHROPLASTY;  Surgeon: Earlie Server, MD;  Location: [facility];  Service:  Orthopedics;  Laterality: Right;  Marland Kitchen VARICOSE VEIN SURGERY  2008   stripping    There were no vitals filed for this visit.  Subjective Assessment - 07/14/19 1622    Subjective  Pt was had severe back and Rt hip pain over the weekend - went to ED and was placed on prednisone and other pain meds for that so she hasn't felt her shoulder/neck pain. Saw ortho MD and is going to have an MRI next Tues and then see him on Thursday for results.  Was told it is coming from her back.    Patient Stated Goals  Stop my arm from hurting    Currently in Pain?  No/denies   pain in back and Rt hip better today from the weekend   Pain Location  Shoulder                        [facility] Adult PT Treatment/Exercise - 07/14/19 0001      Shoulder Exercises: Supine   Other Supine Exercises  lying over half bolster across thoracic region, 15 reps elbow presses with hands behind head, bilat shoulder flexion    Other Supine Exercises  head presses onto pillow, with chin nod      Shoulder Exercises: Standing   External Rotation  Strengthening;Both;10 reps;Theraband  Theraband Level (Shoulder External Rotation)  Level 3 (Green)    Row  Strengthening;Both;10 reps;Theraband    Theraband Level (Shoulder Row)  Level 3 (Green)    Row Limitations  elbows bent, straight and lifted       Shoulder Exercises: ROM/Strengthening   UBE (Upper Arm Bike)  L1x4' Alt FWD/BWD      Modalities   Modalities  Moist Heat      Moist Heat Therapy   Number Minutes Moist Heat  12 Minutes    Moist Heat Location  Cervical      Manual Therapy   Manual therapy comments  occipital base release - Lt side very tight    Soft tissue mobilization  STM to bilat SCM, UT and cervical paraspinals    Manual Traction  cervical with side bend stretch               PT Short Term Goals - 07/08/19 1637      PT SHORT TERM GOAL #1   Title  Pt will be I with HEP to assist with improved mobility while at home performing ADLs     Baseline  issued at eval    Time  2    Period  Weeks    Status  On-going    Target Date  07/09/19      PT SHORT TERM GOAL #2   Title  Pt will report improved L cervical pain </= 5/10 daily performing ADLS    Time  2    Period  Weeks    Status  On-going    Target Date  07/09/19      PT SHORT TERM GOAL #3   Title  Pt will report improved L UE/hand numbness to be present only 50% of the time with performing ADLs    Time  2    Period  Weeks    Status  On-going    Target Date  07/09/19        PT Long Term Goals - 06/24/19 1834      PT LONG TERM GOAL #1   Title  Pt will be able to improve FOTO score to 32% limitation to demo greater functional mobility    Baseline  43%    Time  6    Period  Weeks    Status  New    Target Date  07/30/19      PT LONG TERM GOAL #2   Title  Pt will be able to demonstrate L shoulder AROM to 120 degrees to assist with dressing.    Baseline  103 degrees sitting    Time  6    Period  Weeks    Status  New    Target Date  07/30/19      PT LONG TERM GOAL #3   Title  Pt will be able to sweep with 75% less difficulty    Baseline  unable    Time  6    Period  Weeks    Status  New    Target Date  07/30/19      PT LONG TERM GOAL #4   Title  Pt will be able to curl her hair with 75% less difficulty    Baseline  unable    Time  6    Period  Weeks    Status  New    Target Date  07/30/19            Plan - 07/14/19 1702  Clinical Impression Statement  treatment today was performed in ways that did not stress her lumbar spine.  She is going to have an MRI next week on this.  She has been on prednisone since the weekend ED visit and it has decreased the pain in her shoulder and neck as well as her lumbar and hip. She was able to increase some of her shoulder resistance to the green band.  Slow progress ot her goals.    Rehab Potential  Good    PT Frequency  3x / week    PT Duration  6 weeks    PT Treatment/Interventions   Cryotherapy;Ultrasound;Traction;Moist Heat;Iontophoresis 4mg /ml Dexamethasone;Electrical Stimulation;Functional mobility training;Neuromuscular re-education;Therapeutic exercise;Therapeutic activities;Patient/family education;Manual techniques;Passive range of motion;Dry needling;Taping    PT Next Visit Plan  continue scapular strengthening as long as patient's pain continues to decrease; continue manual therapy as needed    Consulted and Agree with Plan of Care  Patient       Patient will benefit from skilled therapeutic intervention in order to improve the following deficits and impairments:  Decreased mobility, Hypomobility, Increased muscle spasms, Impaired sensation, Improper body mechanics, Decreased range of motion, Decreased activity tolerance, Decreased strength, Impaired flexibility, Impaired UE functional use, Postural dysfunction, Pain  Visit Diagnosis: Abnormal posture  Cervicalgia  Muscle weakness (generalized)  Chronic left shoulder pain     Problem List Patient Active Problem List   Diagnosis Date Noted  . Primary localized osteoarthritis of right hip 08/22/2018  . Carpal tunnel syndrome, bilateral 08/06/2018  . Type 2 diabetes mellitus with peripheral neuropathy ([facility]) 08/06/2018  . Degenerative joint disease of right hip 08/06/2018  . Preoperative clearance 03/26/2018  . Upper back pain on left side 10/30/2016  . Encounter for postoperative carotid endarterectomy surveillance 12/07/2015  . Varicosities of leg 06/03/2015  . Acute left lumbar radiculopathy 04/15/2014  . Chronic sciatica of right side 03/17/2013  . Diabetic peripheral neuropathy ([facility]) 03/17/2013  . Coronary artery disease   . Carotid artery stenosis s/p CEA 2013 10/16/2011  . Carotid artery occlusion without infarction, right 09/17/2011  . Carotid bruit 08/28/2011  . Gout 04/13/2011  . DM type 2 with diabetic peripheral neuropathy ([facility]) 04/02/2008  . Hyperlipidemia associated with type 2 diabetes  mellitus (HCC) 04/02/2008  . Essential hypertension 04/02/2008  . DEGENERATIVE DISC DISEASE, LUMBOSACRAL SPINE 04/02/2008    05/31/2008 PT  07/14/2019, 5:04 PM  [facility] [address] [place], [place], [place] Phone: [phone]   Fax:  [phone]  Name: April Shaffer MRN: [name] Date of Birth: 06/22/1950

## 2019-07-15 ENCOUNTER — Ambulatory Visit: Payer: Medicare HMO | Admitting: Physical Therapy

## 2019-07-15 ENCOUNTER — Encounter: Payer: Self-pay | Admitting: Physical Therapy

## 2019-07-15 DIAGNOSIS — G8929 Other chronic pain: Secondary | ICD-10-CM

## 2019-07-15 DIAGNOSIS — M6281 Muscle weakness (generalized): Secondary | ICD-10-CM

## 2019-07-15 DIAGNOSIS — R293 Abnormal posture: Secondary | ICD-10-CM | POA: Diagnosis not present

## 2019-07-15 DIAGNOSIS — M542 Cervicalgia: Secondary | ICD-10-CM

## 2019-07-15 NOTE — Therapy (Signed)
[facility] Outpatient Rehabilitation [facility] [address] [place], [place], [address] Phone: [phone]   Fax:  [phone]  Physical Therapy Treatment  Patient Details  Name: April Shaffer MRN: [id number] Date of Birth: 12/14/1950 Referring Provider (PT): [place]   Encounter Date: 07/15/2019  PT End of Session - 07/15/19 1637    Visit Number  6    Number of Visits  12    Date for PT Re-Evaluation  08/06/19    Authorization Type  [employer]    Authorization Time Period  approved 18v, 06/24/2019-08/05/2019    Authorization - Visit Number  6    Authorization - Number of Visits  18    PT Start Time  1638   in late   PT Stop Time  1722   heat at end   PT Time Calculation (min)  44 min    Activity Tolerance  Patient tolerated treatment well    Behavior During Therapy  [facility] for tasks assessed/performed       Past Medical History:  Diagnosis Date  . Arthritis   . Carotid artery narrowing   . Coronary artery disease    Cardiac catheterization November 2013: 50% ostial LAD stenosis 50% mid stenosis. 30% disease in the left circumflex.  . Diabetes mellitus, type 2 (HCC)   . Hyperlipidemia   . Hypertension   . Onychomycosis of toenail 07/31/2016  . Sleep apnea       Had surgery to correct    Past Surgical History:  Procedure Laterality Date  . ABDOMINAL HYSTERECTOMY    . CARDIAC CATHETERIZATION    . ENDARTERECTOMY  09/27/2011   Procedure: RIGT ENDARTERECTOMY CAROTID;  Surgeon: Pryor Ochoa, MD;  Location: [facility] OR;  Service: Vascular;  Laterality: Right;  . EPIDURAL BLOCK INJECTION  02/2008   Drs. Dorthula Nettles  . gyn surgery  2004   total hysterectomy for mennorhagia,,salpingoophorectomy  . JOINT REPLACEMENT    . TONSILLECTOMY    . TOTAL ABDOMINAL HYSTERECTOMY W/ BILATERAL SALPINGOOPHORECTOMY Bilateral 2000  . TOTAL HIP ARTHROPLASTY Right 08/22/2018   Procedure: TOTAL HIP ARTHROPLASTY;  Surgeon: Frederico Hamman, MD;  Location: [facility];  Service:  Orthopedics;  Laterality: Right;  Marland Kitchen VARICOSE VEIN SURGERY  2008   stripping    There were no vitals filed for this visit.  Subjective Assessment - 07/15/19 1639    Subjective  Pt reports her shoulder feels great today.  HAs numbness behind the the Rt thigh to her knee    Patient Stated Goals  Stop my arm from hurting    Currently in Pain?  No/denies         [facility] PT Assessment - 07/15/19 0001      Assessment   Medical Diagnosis  L shoulder pain                     [facility] Adult PT Treatment/Exercise - 07/15/19 0001      Shoulder Exercises: Supine   Other Supine Exercises  10 reps each, scare crow to goal posts, goal posts to angels,     Other Supine Exercises  3x10 SASH with green band each side       Shoulder Exercises: Prone   Other Prone Exercises  POE attempted wt shift side to side - increased pain in Rt hip and leg so we stopped, performed 5 reps press ups       Shoulder Exercises: Sidelying   External Rotation  Strengthening;Left;Weights   3x10 with towel  under elbow   External Rotation Weight (lbs)  3    Other Sidelying Exercises  3x10 empty can Lt UE in small ROM with 2#  2x10 open book Lt with green band       Shoulder Exercises: ROM/Strengthening   UBE (Upper Arm Bike)  L2x4' Alt FWD/BWD      Modalities   Modalities  Moist Heat      Moist Heat Therapy   Number Minutes Moist Heat  12 Minutes    Moist Heat Location  Cervical;Lumbar Spine               PT Short Term Goals - 07/08/19 1637      PT SHORT TERM GOAL #1   Title  Pt will be I with HEP to assist with improved mobility while at home performing ADLs    Baseline  issued at eval    Time  2    Period  Weeks    Status  On-going    Target Date  07/09/19      PT SHORT TERM GOAL #2   Title  Pt will report improved L cervical pain </= 5/10 daily performing ADLS    Time  2    Period  Weeks    Status  On-going    Target Date  07/09/19      PT SHORT TERM GOAL #3   Title  Pt will  report improved L UE/hand numbness to be present only 50% of the time with performing ADLs    Time  2    Period  Weeks    Status  On-going    Target Date  07/09/19        PT Long Term Goals - 06/24/19 1834      PT LONG TERM GOAL #1   Title  Pt will be able to improve FOTO score to 32% limitation to demo greater functional mobility    Baseline  43%    Time  6    Period  Weeks    Status  New    Target Date  07/30/19      PT LONG TERM GOAL #2   Title  Pt will be able to demonstrate L shoulder AROM to 120 degrees to assist with dressing.    Baseline  103 degrees sitting    Time  6    Period  Weeks    Status  New    Target Date  07/30/19      PT LONG TERM GOAL #3   Title  Pt will be able to sweep with 75% less difficulty    Baseline  unable    Time  6    Period  Weeks    Status  New    Target Date  07/30/19      PT LONG TERM GOAL #4   Title  Pt will be able to curl her hair with 75% less difficulty    Baseline  unable    Time  6    Period  Weeks    Status  New    Target Date  07/30/19            Plan - 07/15/19 1712    Clinical Impression Statement  April Shaffer had no neck or shoulder pain today.  She did well with higher level exercise.  Lt shoulder has less ROM as compared to Rt.  Some of her pain reduction is probably from the prednisone for her back.  She will  be done with that soon and we will monitor for change in neck and shoulder    Rehab Potential  Good    PT Frequency  3x / week    PT Duration  6 weeks    PT Treatment/Interventions  Cryotherapy;Ultrasound;Traction;Moist Heat;Iontophoresis 4mg /ml Dexamethasone;Electrical Stimulation;Functional mobility training;Neuromuscular re-education;Therapeutic exercise;Therapeutic activities;Patient/family education;Manual techniques;Passive range of motion;Dry needling;Taping    PT Next Visit Plan  continue scapular strengthening as long as patient's pain continues to decrease; continue manual therapy as needed     Consulted and Agree with Plan of Care  Patient       Patient will benefit from skilled therapeutic intervention in order to improve the following deficits and impairments:  Decreased mobility, Hypomobility, Increased muscle spasms, Impaired sensation, Improper body mechanics, Decreased range of motion, Decreased activity tolerance, Decreased strength, Impaired flexibility, Impaired UE functional use, Postural dysfunction, Pain  Visit Diagnosis: Abnormal posture  Cervicalgia  Muscle weakness (generalized)  Chronic left shoulder pain     Problem List Patient Active Problem List   Diagnosis Date Noted  . Primary localized osteoarthritis of right hip 08/22/2018  . Carpal tunnel syndrome, bilateral 08/06/2018  . Type 2 diabetes mellitus with peripheral neuropathy ([place]) 08/06/2018  . Degenerative joint disease of right hip 08/06/2018  . Preoperative clearance 03/26/2018  . Upper back pain on left side 10/30/2016  . Encounter for postoperative carotid endarterectomy surveillance 12/07/2015  . Varicosities of leg 06/03/2015  . Acute left lumbar radiculopathy 04/15/2014  . Chronic sciatica of right side 03/17/2013  . Diabetic peripheral neuropathy ([place]) 03/17/2013  . Coronary artery disease   . Carotid artery stenosis s/p CEA 2013 10/16/2011  . Carotid artery occlusion without infarction, right 09/17/2011  . Carotid bruit 08/28/2011  . Gout 04/13/2011  . DM type 2 with diabetic peripheral neuropathy ([place]) 04/02/2008  . Hyperlipidemia associated with type 2 diabetes mellitus ([place]) 04/02/2008  . Essential hypertension 04/02/2008  . [place] DISEASE, LUMBOSACRAL SPINE 04/02/2008    Jeral Pinch PT  07/15/2019, 5:14 PM  [facility] [address] [place], [place], [address] Phone: [phone]   Fax:  [phone]  Name: April Shaffer MRN: [id number] Date of Birth: 1951/01/02

## 2019-07-21 ENCOUNTER — Other Ambulatory Visit: Payer: Self-pay

## 2019-07-21 ENCOUNTER — Ambulatory Visit: Payer: Medicare HMO | Admitting: Physical Therapy

## 2019-07-21 DIAGNOSIS — M542 Cervicalgia: Secondary | ICD-10-CM

## 2019-07-21 DIAGNOSIS — R293 Abnormal posture: Secondary | ICD-10-CM | POA: Diagnosis not present

## 2019-07-21 DIAGNOSIS — M25512 Pain in left shoulder: Secondary | ICD-10-CM

## 2019-07-21 DIAGNOSIS — M6281 Muscle weakness (generalized): Secondary | ICD-10-CM

## 2019-07-21 NOTE — Therapy (Signed)
[place], [place], [address] Phone: [phone]   Fax:  [phone]  Physical Therapy Treatment  Patient Details  Name: April Shaffer MRN: [id number] Date of Birth: [birth year] Referring Provider (PT): [place]   Encounter Date: 07/21/2019  PT End of Session - 07/21/19 1747    Visit Number  7    Number of Visits  12    Date for PT Re-Evaluation  08/06/19    Authorization Type  [employer]    Authorization - Visit Number  7    Authorization - Number of Visits  18    PT Start Time  0240    PT Stop Time  1745    PT Time Calculation (min)  42 min       Past Medical History:  Diagnosis Date  . Arthritis   . Carotid artery narrowing   . Coronary artery disease    Cardiac catheterization November 2013: 50% ostial LAD stenosis 50% mid stenosis. 30% disease in the left circumflex.  . Diabetes mellitus, type 2 ([place])   . Hyperlipidemia   . Hypertension   . Onychomycosis of toenail 07/31/2016  . Sleep apnea       Had surgery to correct    Past Surgical History:  Procedure Laterality Date  . ABDOMINAL HYSTERECTOMY    . CARDIAC CATHETERIZATION    . ENDARTERECTOMY  09/27/2011   Procedure: RIGT ENDARTERECTOMY CAROTID;  Surgeon: Mal Misty, MD;  Location: [facility];  Service: Vascular;  Laterality: Right;  . EPIDURAL BLOCK INJECTION  02/2008   Drs. Eulis Manly  . gyn surgery  2004   total hysterectomy for mennorhagia,,salpingoophorectomy  . JOINT REPLACEMENT    . TONSILLECTOMY    . TOTAL ABDOMINAL HYSTERECTOMY W/ BILATERAL SALPINGOOPHORECTOMY Bilateral 2000  . TOTAL HIP ARTHROPLASTY Right 08/22/2018   Procedure: TOTAL HIP ARTHROPLASTY;  Surgeon: Earlie Server, MD;  Location: [facility];  Service: Orthopedics;  Laterality: Right;  Marland Kitchen VARICOSE VEIN SURGERY  2008   stripping    There were no vitals filed for this visit.  Subjective Assessment - 07/21/19 1708    Subjective  Shoulder is a little  better today, 7/10. Had MRI this AM for my back.    Currently in Pain?  Yes    Pain Score  7     Pain Location  Shoulder    Pain Orientation  Left    Pain Descriptors / Indicators  Tightness;Tiring    Pain Type  Chronic pain    Pain Radiating Towards  less in fingers than previous    Pain Onset  More than a month ago    Pain Frequency  Intermittent    Aggravating Factors   Using it.    Multiple Pain Sites  No          [facility] Adult PT Treatment/Exercise - 07/21/19 0001      Shoulder Exercises: Seated   Elevation  Strengthening;Both;15 reps    Elevation Weight (lbs)  used green band taut     External Rotation  Strengthening;Both;15 reps    Theraband Level (Shoulder External Rotation)  Level 3 (Green)    Other Seated Exercises  extension with abduction x 10 green       Shoulder Exercises: Sidelying   External Rotation  Strengthening;Left;15 reps   3, lbs x 15, 4 lbs x 15    Flexion Weight (lbs)  flexion and abduction combo no weight x 10    90 deg scap  stabilized     Shoulder Exercises: Standing   Protraction Weight (lbs)  stretching at wall       Shoulder Exercises: ROM/Strengthening   UBE (Upper Arm Bike)  L1 3 min back, 3 min forward     Wall Pushups  10 reps    Other ROM/Strengthening Exercises  wall :goal post and open x 15 with ball behind scapula      Shoulder Exercises: Stretch   Corner Stretch  4 reps;30 seconds      Manual Therapy   Joint Mobilization  L GH joint a/p    Soft tissue mobilization  L pec major, minor              PT Education - 07/21/19 1747    Education Details  posture, corner stretch    Person(s) Educated  Patient    Methods  Explanation;Demonstration;Verbal cues;Handout    Comprehension  Verbalized understanding;Returned demonstration       PT Short Term Goals - 07/08/19 1637      PT SHORT TERM GOAL #1   Title  Pt will be I with HEP to assist with improved mobility while at home performing ADLs    Baseline  issued at eval     Time  2    Period  Weeks    Status  On-going    Target Date  07/09/19      PT SHORT TERM GOAL #2   Title  Pt will report improved L cervical pain </= 5/10 daily performing ADLS    Time  2    Period  Weeks    Status  On-going    Target Date  07/09/19      PT SHORT TERM GOAL #3   Title  Pt will report improved L UE/hand numbness to be present only 50% of the time with performing ADLs    Time  2    Period  Weeks    Status  On-going    Target Date  07/09/19        PT Long Term Goals - 06/24/19 1834      PT LONG TERM GOAL #1   Title  Pt will be able to improve FOTO score to 32% limitation to demo greater functional mobility    Baseline  43%    Time  6    Period  Weeks    Status  New    Target Date  07/30/19      PT LONG TERM GOAL #2   Title  Pt will be able to demonstrate L shoulder AROM to 120 degrees to assist with dressing.    Baseline  103 degrees sitting    Time  6    Period  Weeks    Status  New    Target Date  07/30/19      PT LONG TERM GOAL #3   Title  Pt will be able to sweep with 75% less difficulty    Baseline  unable    Time  6    Period  Weeks    Status  New    Target Date  07/30/19      PT LONG TERM GOAL #4   Title  Pt will be able to curl her hair with 75% less difficulty    Baseline  unable    Time  6    Period  Weeks    Status  New    Target Date  07/30/19  Plan - 07/21/19 1748    Clinical Impression Statement  Patient showing good progress, able to strengthen without increased pain .  Worked on posture and correcting with exercise.    PT Treatment/Interventions  Cryotherapy;Ultrasound;Traction;Moist Heat;Iontophoresis 4mg /ml Dexamethasone;Electrical Stimulation;Functional mobility training;Neuromuscular re-education;Therapeutic exercise;Therapeutic activities;Patient/family education;Manual techniques;Passive range of motion;Dry needling;Taping    PT Next Visit Plan  continue scapular strengthening , stretch pecs continue manual  therapy as needed    PT Home Exercise Plan  see pt education/instruction for further details; chin tucks supine; levator stretch sitting; scap retract sitting    Consulted and Agree with Plan of Care  Patient       Patient will benefit from skilled therapeutic intervention in order to improve the following deficits and impairments:  Decreased mobility, Hypomobility, Increased muscle spasms, Impaired sensation, Improper body mechanics, Decreased range of motion, Decreased activity tolerance, Decreased strength, Impaired flexibility, Impaired UE functional use, Postural dysfunction, Pain  Visit Diagnosis: Abnormal posture  Cervicalgia  Muscle weakness (generalized)  Chronic left shoulder pain     Problem List Patient Active Problem List   Diagnosis Date Noted  . Primary localized osteoarthritis of right hip 08/22/2018  . Carpal tunnel syndrome, bilateral 08/06/2018  . Type 2 diabetes mellitus with peripheral neuropathy (HCC) 08/06/2018  . Degenerative joint disease of right hip 08/06/2018  . Preoperative clearance 03/26/2018  . Upper back pain on left side 10/30/2016  . Encounter for postoperative carotid endarterectomy surveillance 12/07/2015  . Varicosities of leg 06/03/2015  . Acute left lumbar radiculopathy 04/15/2014  . Chronic sciatica of right side 03/17/2013  . Diabetic peripheral neuropathy (HCC) 03/17/2013  . Coronary artery disease   . Carotid artery stenosis s/p CEA 2013 10/16/2011  . Carotid artery occlusion without infarction, right 09/17/2011  . Carotid bruit 08/28/2011  . Gout 04/13/2011  . DM type 2 with diabetic peripheral neuropathy (HCC) 04/02/2008  . Hyperlipidemia associated with type 2 diabetes mellitus (HCC) 04/02/2008  . Essential hypertension 04/02/2008  . DEGENERATIVE DISC DISEASE, LUMBOSACRAL SPINE 04/02/2008    Barnet Benavides 07/21/2019, 5:50 PM  [facility] [address] [place], [place],  [place] Phone: [phone]   Fax:  [phone]  Name: CORI JUSTUS MRN: [name] Date of Birth: December 19, 1950  07/31/1950, PT 07/21/19 5:51 PM Phone: [phone] Fax: [phone]

## 2019-07-21 NOTE — Patient Instructions (Signed)
Flexibility: Corner Stretch    Standing in corner with hands just above shoulder level and feet ___10-12_ inches from corner, lean forward until a comfortable stretch is felt across chest. Hold __30__ seconds. Repeat __3__ times per set. Do __1__ sets per session. Do ___1-2_ sessions per day.  http://orth.exer.us/343   Copyright  VHI. All rights reserved.

## 2019-07-22 ENCOUNTER — Ambulatory Visit: Payer: Medicare HMO | Admitting: Rehabilitative and Restorative Service Providers"

## 2019-07-22 ENCOUNTER — Other Ambulatory Visit: Payer: Self-pay

## 2019-07-22 DIAGNOSIS — M542 Cervicalgia: Secondary | ICD-10-CM

## 2019-07-22 DIAGNOSIS — M25512 Pain in left shoulder: Secondary | ICD-10-CM

## 2019-07-22 DIAGNOSIS — M25651 Stiffness of right hip, not elsewhere classified: Secondary | ICD-10-CM

## 2019-07-22 DIAGNOSIS — M25511 Pain in right shoulder: Secondary | ICD-10-CM

## 2019-07-22 DIAGNOSIS — Z96641 Presence of right artificial hip joint: Secondary | ICD-10-CM

## 2019-07-22 DIAGNOSIS — R293 Abnormal posture: Secondary | ICD-10-CM

## 2019-07-22 DIAGNOSIS — G8929 Other chronic pain: Secondary | ICD-10-CM

## 2019-07-22 DIAGNOSIS — M6281 Muscle weakness (generalized): Secondary | ICD-10-CM

## 2019-07-22 NOTE — Therapy (Signed)
[facility] Outpatient Rehabilitation [facility] [address] [place], [place], [address] Phone: [phone]   Fax:  [phone]  Physical Therapy Treatment  Patient Details  Name: April Shaffer MRN: [id number] Date of Birth: 09-12-1950 Referring Provider (PT): [place]   Encounter Date: 07/22/2019  PT End of Session - 07/22/19 1707    Visit Number  8    Number of Visits  12    Date for PT Re-Evaluation  08/06/19    Authorization Type  [employer]    Authorization Time Period  approved 18v, 06/24/2019-08/05/2019    Authorization - Visit Number  8    Authorization - Number of Visits  18    PT Start Time  0420    PT Stop Time  0500    PT Time Calculation (min)  40 min    Activity Tolerance  Patient tolerated treatment well;No increased pain    Behavior During Therapy  WFL for tasks assessed/performed       Past Medical History:  Diagnosis Date  . Arthritis   . Carotid artery narrowing   . Coronary artery disease    Cardiac catheterization November 2013: 50% ostial LAD stenosis 50% mid stenosis. 30% disease in the left circumflex.  . Diabetes mellitus, type 2 (HCC)   . Hyperlipidemia   . Hypertension   . Onychomycosis of toenail 07/31/2016  . Sleep apnea       Had surgery to correct    Past Surgical History:  Procedure Laterality Date  . ABDOMINAL HYSTERECTOMY    . CARDIAC CATHETERIZATION    . ENDARTERECTOMY  09/27/2011   Procedure: RIGT ENDARTERECTOMY CAROTID;  Surgeon: Pryor Ochoa, MD;  Location: [facility] OR;  Service: Vascular;  Laterality: Right;  . EPIDURAL BLOCK INJECTION  02/2008   Drs. Dorthula Nettles  . gyn surgery  2004   total hysterectomy for mennorhagia,,salpingoophorectomy  . JOINT REPLACEMENT    . TONSILLECTOMY    . TOTAL ABDOMINAL HYSTERECTOMY W/ BILATERAL SALPINGOOPHORECTOMY Bilateral 2000  . TOTAL HIP ARTHROPLASTY Right 08/22/2018   Procedure: TOTAL HIP ARTHROPLASTY;  Surgeon: Frederico Hamman, MD;  Location: [facility];  Service:  Orthopedics;  Laterality: Right;  Marland Kitchen VARICOSE VEIN SURGERY  2008   stripping    There were no vitals filed for this visit.  Subjective Assessment - 07/22/19 1641    Subjective  7-8/10 shoulder pain but it is a little better.    Patient Stated Goals  Stop my arm from hurting    Currently in Pain?  Yes    Pain Score  7     Pain Location  Shoulder    Pain Orientation  Left    Pain Descriptors / Indicators  Tightness    Pain Type  Chronic pain    Pain Radiating Towards  less in fingers    Pain Onset  More than a month ago    Pain Frequency  Intermittent    Aggravating Factors   using it too much    Multiple Pain Sites  No                        [facility] Adult PT Treatment/Exercise - 07/22/19 0001      Shoulder Exercises: Seated   Other Seated Exercises  scapular horizontal abduction/adduction with concentration on scap retraction x 20; scaption x 20 with PT need to tactiley cue patient for technique; bil scapular depression x 20; seated nustep Level 5 UEs only with PT verbal and tactile  cues for scap retract as pull back on handle; thoracic stretch with neck flexion 2x30 sec; Levator stretch 2x30 sec unilat bil; scap retract iso with cervical flex/ext AROM x 10; cervical sidebending 2x30 sec      Shoulder Exercises: Standing   Other Standing Exercises  wall pushups at 2 levels, at neutral and slightly lower x 15 each; GTB L shoulder ext x 20; GTB shoulder row x 20, scapular depression L x 20; GTB L ER x 20; unilat doorway pec stretch x30 sec bil               PT Short Term Goals - 07/22/19 1647      PT SHORT TERM GOAL #1   Title  Pt will be I with HEP to assist with improved mobility while at home performing ADLs    Baseline  issued at eval    Time  2    Period  Weeks    Status  Achieved      PT SHORT TERM GOAL #2   Title  Pt will report improved L cervical pain </= 5/10 daily performing ADLS    Time  2    Period  Weeks    Status  On-going    Target  Date  07/09/19      PT SHORT TERM GOAL #3   Title  Pt will report improved L UE/hand numbness to be present only 50% of the time with performing ADLs    Status  Achieved        PT Long Term Goals - 06/24/19 1834      PT LONG TERM GOAL #1   Title  Pt will be able to improve FOTO score to 32% limitation to demo greater functional mobility    Baseline  43%    Time  6    Period  Weeks    Status  New    Target Date  07/30/19      PT LONG TERM GOAL #2   Title  Pt will be able to demonstrate L shoulder AROM to 120 degrees to assist with dressing.    Baseline  103 degrees sitting    Time  6    Period  Weeks    Status  New    Target Date  07/30/19      PT LONG TERM GOAL #3   Title  Pt will be able to sweep with 75% less difficulty    Baseline  unable    Time  6    Period  Weeks    Status  New    Target Date  07/30/19      PT LONG TERM GOAL #4   Title  Pt will be able to curl her hair with 75% less difficulty    Baseline  unable    Time  6    Period  Weeks    Status  New    Target Date  07/30/19            Plan - 07/22/19 1646    Clinical Impression Statement  Pt demonstrating and reporting functional progress but with continued pain without significant reduction in pain number. Pt would benefit from further PT for scapular strenthening exercises to assist with posture and improved functional mobility with decreased pain.    Rehab Potential  Good    PT Frequency  3x / week    PT Duration  6 weeks    PT Treatment/Interventions  Cryotherapy;Ultrasound;Traction;Moist Heat;Iontophoresis 4mg /ml Dexamethasone;Electrical  Stimulation;Functional mobility training;Neuromuscular re-education;Therapeutic exercise;Therapeutic activities;Patient/family education;Manual techniques;Passive range of motion;Dry needling;Taping    PT Next Visit Plan  continue scapular strengthening , stretch pecs continue manual therapy as needed    PT Home Exercise Plan  see pt education/instruction for  further details; chin tucks supine; levator stretch sitting; scap retract sitting    Consulted and Agree with Plan of Care  Patient       Patient will benefit from skilled therapeutic intervention in order to improve the following deficits and impairments:  Decreased mobility, Hypomobility, Increased muscle spasms, Impaired sensation, Improper body mechanics, Decreased range of motion, Decreased activity tolerance, Decreased strength, Impaired flexibility, Impaired UE functional use, Postural dysfunction, Pain  Visit Diagnosis: Acute pain of right shoulder  Stiffness of right hip, not elsewhere classified  Cervicalgia  Muscle weakness (generalized)  History of total hip arthroplasty, right  Abnormal posture  Chronic left shoulder pain     Problem List Patient Active Problem List   Diagnosis Date Noted  . Primary localized osteoarthritis of right hip 08/22/2018  . Carpal tunnel syndrome, bilateral 08/06/2018  . Type 2 diabetes mellitus with peripheral neuropathy ([place]) 08/06/2018  . Degenerative joint disease of right hip 08/06/2018  . Preoperative clearance 03/26/2018  . Upper back pain on left side 10/30/2016  . Encounter for postoperative carotid endarterectomy surveillance 12/07/2015  . Varicosities of leg 06/03/2015  . Acute left lumbar radiculopathy 04/15/2014  . Chronic sciatica of right side 03/17/2013  . Diabetic peripheral neuropathy ([place]) 03/17/2013  . Coronary artery disease   . Carotid artery stenosis s/p CEA 2013 10/16/2011  . Carotid artery occlusion without infarction, right 09/17/2011  . Carotid bruit 08/28/2011  . Gout 04/13/2011  . DM type 2 with diabetic peripheral neuropathy ([place]) 04/02/2008  . Hyperlipidemia associated with type 2 diabetes mellitus ([place]) 04/02/2008  . Essential hypertension 04/02/2008  . [place] DISEASE, LUMBOSACRAL SPINE 04/02/2008    Myra Rude , PT 07/22/2019, 5:08 PM  [facility] [address] [place], [place], [address] Phone: [phone]   Fax:  [phone]  Name: April Shaffer MRN: [id number] Date of Birth: December 23, 1950

## 2019-07-28 ENCOUNTER — Ambulatory Visit: Payer: Medicare HMO | Attending: Internal Medicine | Admitting: Physical Therapy

## 2019-07-28 ENCOUNTER — Other Ambulatory Visit: Payer: Self-pay

## 2019-07-28 ENCOUNTER — Encounter: Payer: Self-pay | Admitting: Physical Therapy

## 2019-07-28 DIAGNOSIS — R293 Abnormal posture: Secondary | ICD-10-CM

## 2019-07-28 DIAGNOSIS — M542 Cervicalgia: Secondary | ICD-10-CM | POA: Diagnosis present

## 2019-07-28 DIAGNOSIS — M25511 Pain in right shoulder: Secondary | ICD-10-CM

## 2019-07-28 DIAGNOSIS — M6281 Muscle weakness (generalized): Secondary | ICD-10-CM

## 2019-07-28 NOTE — Therapy (Signed)
[facility], [place], [address] Phone: [phone]   Fax:  [phone]  Physical Therapy Treatment  Patient Details  Name: April Shaffer MRN: [id number] Date of Birth: 27-Jun-1950 Referring Provider (PT): [place]   Encounter Date: 07/28/2019  PT End of Session - 07/28/19 1606    Visit Number  9    Number of Visits  12    Date for PT Re-Evaluation  08/06/19    Authorization Type  [employer]    Authorization Time Period  approved 18v, 06/24/2019-08/05/2019    Authorization - Visit Number  9    Authorization - Number of Visits  18    PT Start Time  0630    PT Stop Time  1601    PT Time Calculation (min)  38 min    Activity Tolerance  Patient tolerated treatment well;No increased pain    Behavior During Therapy  WFL for tasks assessed/performed       Past Medical History:  Diagnosis Date  . Arthritis   . Carotid artery narrowing   . Coronary artery disease    Cardiac catheterization November 2013: 50% ostial LAD stenosis 50% mid stenosis. 30% disease in the left circumflex.  . Diabetes mellitus, type 2 ([place])   . Hyperlipidemia   . Hypertension   . Onychomycosis of toenail 07/31/2016  . Sleep apnea       Had surgery to correct    Past Surgical History:  Procedure Laterality Date  . ABDOMINAL HYSTERECTOMY    . CARDIAC CATHETERIZATION    . ENDARTERECTOMY  09/27/2011   Procedure: RIGT ENDARTERECTOMY CAROTID;  Surgeon: Mal Misty, MD;  Location: [facility];  Service: Vascular;  Laterality: Right;  . EPIDURAL BLOCK INJECTION  02/2008   Drs. Eulis Manly  . gyn surgery  2004   total hysterectomy for mennorhagia,,salpingoophorectomy  . JOINT REPLACEMENT    . TONSILLECTOMY    . TOTAL ABDOMINAL HYSTERECTOMY W/ BILATERAL SALPINGOOPHORECTOMY Bilateral 2000  . TOTAL HIP ARTHROPLASTY Right 08/22/2018   Procedure: TOTAL HIP ARTHROPLASTY;  Surgeon: Earlie Server, MD;  Location: [facility];  Service:  Orthopedics;  Laterality: Right;  Marland Kitchen VARICOSE VEIN SURGERY  2008   stripping    There were no vitals filed for this visit.  Subjective Assessment - 07/28/19 1607    Subjective  Pt reports she quit her job on Friday - has so much less stress now.  She had an injection in her hip Thursday and now her whole body feels better    Patient Stated Goals  Stop my arm from hurting    Currently in Pain?  No/denies                        [facility] Adult PT Treatment/Exercise - 07/28/19 0001      Shoulder Exercises: Supine   Other Supine Exercises  10 reps each, scare crow to goal posts, goal posts to angels,     Other Supine Exercises  20 reps head presses into ball off EOB      Shoulder Exercises: Standing   Horizontal ABduction  Strengthening;Both;Theraband   3x8, outside arm doing the motion   Theraband Level (Shoulder Horizontal ABduction)  Level 3 (Green)   inside arm in isometric hold be side     Shoulder Exercises: ROM/Strengthening   UBE (Upper Arm Bike)  L2x6' alt FWD/BWD    Lat Pull  --   3x8, 15#, VC for form   "  W" Arms  red band, 3x8      Shoulder Exercises: Stretch   Other Shoulder Stretches  thoracic mobilization lying over half bolster with snow angels, then hands behind head elbow '    Other Shoulder Stretches  scapular depression in hooklying       Manual Therapy   Joint Mobilization  Lt shoulder ER mobs grade IV and II               PT Short Term Goals - 07/22/19 1647      PT SHORT TERM GOAL #1   Title  Pt will be I with HEP to assist with improved mobility while at home performing ADLs    Baseline  issued at eval    Time  2    Period  Weeks    Status  Achieved      PT SHORT TERM GOAL #2   Title  Pt will report improved L cervical pain </= 5/10 daily performing ADLS    Time  2    Period  Weeks    Status  On-going    Target Date  07/09/19      PT SHORT TERM GOAL #3   Title  Pt will report improved L UE/hand numbness to be present only  50% of the time with performing ADLs    Status  Achieved        PT Long Term Goals - 06/24/19 1834      PT LONG TERM GOAL #1   Title  Pt will be able to improve FOTO score to 32% limitation to demo greater functional mobility    Baseline  43%    Time  6    Period  Weeks    Status  New    Target Date  07/30/19      PT LONG TERM GOAL #2   Title  Pt will be able to demonstrate L shoulder AROM to 120 degrees to assist with dressing.    Baseline  103 degrees sitting    Time  6    Period  Weeks    Status  New    Target Date  07/30/19      PT LONG TERM GOAL #3   Title  Pt will be able to sweep with 75% less difficulty    Baseline  unable    Time  6    Period  Weeks    Status  New    Target Date  07/30/19      PT LONG TERM GOAL #4   Title  Pt will be able to curl her hair with 75% less difficulty    Baseline  unable    Time  6    Period  Weeks    Status  New    Target Date  07/30/19            Plan - 07/28/19 1644    Clinical Impression Statement  Pt is pleased with her progress, she thinks she may be ready for discharge to her HEP.  Still has some limited Lt shoulder ROM, pain is much reduced.    Rehab Potential  Good    PT Duration  6 weeks    PT Treatment/Interventions  Cryotherapy;Ultrasound;Traction;Moist Heat;Iontophoresis 4mg /ml Dexamethasone;Electrical Stimulation;Functional mobility training;Neuromuscular re-education;Therapeutic exercise;Therapeutic activities;Patient/family education;Manual techniques;Passive range of motion;Dry needling;Taping    PT Next Visit Plan  assess goals and progress for discharge to HEP    Consulted and Agree with Plan of Care  Patient  Patient will benefit from skilled therapeutic intervention in order to improve the following deficits and impairments:  Decreased mobility, Hypomobility, Increased muscle spasms, Impaired sensation, Improper body mechanics, Decreased range of motion, Decreased activity tolerance, Decreased  strength, Impaired flexibility, Impaired UE functional use, Postural dysfunction, Pain  Visit Diagnosis: Acute pain of right shoulder  Cervicalgia  Muscle weakness (generalized)  Abnormal posture     Problem List Patient Active Problem List   Diagnosis Date Noted  . Primary localized osteoarthritis of right hip 08/22/2018  . Carpal tunnel syndrome, bilateral 08/06/2018  . Type 2 diabetes mellitus with peripheral neuropathy (HCC) 08/06/2018  . Degenerative joint disease of right hip 08/06/2018  . Preoperative clearance 03/26/2018  . Upper back pain on left side 10/30/2016  . Encounter for postoperative carotid endarterectomy surveillance 12/07/2015  . Varicosities of leg 06/03/2015  . Acute left lumbar radiculopathy 04/15/2014  . Chronic sciatica of right side 03/17/2013  . Diabetic peripheral neuropathy (HCC) 03/17/2013  . Coronary artery disease   . Carotid artery stenosis s/p CEA 2013 10/16/2011  . Carotid artery occlusion without infarction, right 09/17/2011  . Carotid bruit 08/28/2011  . Gout 04/13/2011  . DM type 2 with diabetic peripheral neuropathy (HCC) 04/02/2008  . Hyperlipidemia associated with type 2 diabetes mellitus (HCC) 04/02/2008  . Essential hypertension 04/02/2008  . DEGENERATIVE DISC DISEASE, LUMBOSACRAL SPINE 04/02/2008    Roderic Scarce PT  07/28/2019, 4:46 PM  [facility] [address] [place], [place], [address] Phone: [phone]   Fax:  [phone]  Name: April Shaffer MRN: [id number] Date of Birth: [birth year]

## 2019-07-30 ENCOUNTER — Ambulatory Visit: Payer: Medicare HMO | Admitting: Physical Therapy

## 2019-08-03 ENCOUNTER — Other Ambulatory Visit: Payer: Self-pay | Admitting: Internal Medicine

## 2019-08-03 ENCOUNTER — Other Ambulatory Visit: Payer: Medicare HMO

## 2019-08-04 ENCOUNTER — Other Ambulatory Visit (INDEPENDENT_AMBULATORY_CARE_PROVIDER_SITE_OTHER): Payer: Medicare HMO

## 2019-08-04 ENCOUNTER — Encounter: Payer: Self-pay | Admitting: Physical Therapy

## 2019-08-04 ENCOUNTER — Other Ambulatory Visit: Payer: Self-pay

## 2019-08-04 ENCOUNTER — Ambulatory Visit: Payer: Medicare HMO | Admitting: Physical Therapy

## 2019-08-04 DIAGNOSIS — M25511 Pain in right shoulder: Secondary | ICD-10-CM

## 2019-08-04 DIAGNOSIS — M542 Cervicalgia: Secondary | ICD-10-CM

## 2019-08-04 DIAGNOSIS — R293 Abnormal posture: Secondary | ICD-10-CM

## 2019-08-04 DIAGNOSIS — E785 Hyperlipidemia, unspecified: Secondary | ICD-10-CM

## 2019-08-04 DIAGNOSIS — E1142 Type 2 diabetes mellitus with diabetic polyneuropathy: Secondary | ICD-10-CM | POA: Diagnosis not present

## 2019-08-04 DIAGNOSIS — Z79899 Other long term (current) drug therapy: Secondary | ICD-10-CM

## 2019-08-04 DIAGNOSIS — M6281 Muscle weakness (generalized): Secondary | ICD-10-CM

## 2019-08-04 NOTE — Therapy (Signed)
[place], [place], [address] Phone: [phone]   Fax:  [phone]  Physical Therapy Treatment/Discharge   Patient Details  Name: April Shaffer MRN: [id number] Date of Birth: [birth year] Referring Provider (PT): Amil Amen   Encounter Date: 08/04/2019  PT End of Session - 08/04/19 1627    Visit Number  10    Date for PT Re-Evaluation  08/06/19    Authorization Type  [employer]    Authorization Time Period  approved 18v, 06/24/2019-08/05/2019    Authorization - Visit Number  10    Authorization - Number of Visits  18    PT Start Time  9767    PT Stop Time  1700    PT Time Calculation (min)  45 min    Activity Tolerance  Patient tolerated treatment well;No increased pain    Behavior During Therapy  WFL for tasks assessed/performed       Past Medical History:  Diagnosis Date  . Arthritis   . Carotid artery narrowing   . Coronary artery disease    Cardiac catheterization November 2013: 50% ostial LAD stenosis 50% mid stenosis. 30% disease in the left circumflex.  . Diabetes mellitus, type 2 ([place])   . Hyperlipidemia   . Hypertension   . Onychomycosis of toenail 07/31/2016  . Sleep apnea       Had surgery to correct    Past Surgical History:  Procedure Laterality Date  . ABDOMINAL HYSTERECTOMY    . CARDIAC CATHETERIZATION    . ENDARTERECTOMY  09/27/2011   Procedure: RIGT ENDARTERECTOMY CAROTID;  Surgeon: Mal Misty, MD;  Location: [facility];  Service: Vascular;  Laterality: Right;  . EPIDURAL BLOCK INJECTION  02/2008   Drs. Eulis Manly  . gyn surgery  2004   total hysterectomy for mennorhagia,,salpingoophorectomy  . JOINT REPLACEMENT    . TONSILLECTOMY    . TOTAL ABDOMINAL HYSTERECTOMY W/ BILATERAL SALPINGOOPHORECTOMY Bilateral 2000  . TOTAL HIP ARTHROPLASTY Right 08/22/2018   Procedure: TOTAL HIP ARTHROPLASTY;  Surgeon: Earlie Server, MD;  Location: [facility];  Service: Orthopedics;   Laterality: Right;  Marland Kitchen VARICOSE VEIN SURGERY  2008   stripping    There were no vitals filed for this visit.  Subjective Assessment - 08/04/19 1625    Subjective  Its OK , still hurts a bit.  2/10-3/10.    Currently in Pain?  Yes    Pain Score  2          [facility] PT Assessment - 08/04/19 0001      AROM   Overall AROM   --   flex 135, ER to T1 and IR to mid back (FR)    Overall AROM Comments  strength flexion 4/5, abd 4/5, ER 4/5 IR 5/5       Strength   Overall Strength Comments  see above              [facility] Adult PT Treatment/Exercise - 08/04/19 0001      Shoulder Exercises: Supine   External Rotation  Strengthening;Both;15 reps    Theraband Level (Shoulder External Rotation)  Level 3 (Green)      Shoulder Exercises: Standing   Horizontal ABduction  Strengthening;Both;20 reps;Theraband    Theraband Level (Shoulder Horizontal ABduction)  Level 3 (Green)    External Rotation  Strengthening;Both;20 reps;Theraband    Theraband Level (Shoulder External Rotation)  Level 3 (Green)    Extension  Strengthening;Both;15 reps    Theraband Level (Shoulder Extension)  Level 3 (Green)    Row  Strengthening;Both;15 reps    Theraband Level (Shoulder Row)  Level 3 (Green)      Shoulder Exercises: ROM/Strengthening   UBE (Upper Arm Bike)  L2 5 min retro cues for posture       Manual Therapy   Joint Mobilization  Lt shoulder ER mobs grade IV and II    Soft tissue mobilization  lateral deltoid, pec minor , posterior scapular, rhomboid     Manual Traction  L UE distraction with flexion, abduction                PT Short Term Goals - 08/04/19 1628      PT SHORT TERM GOAL #1   Title  Pt will be I with HEP to assist with improved mobility while at home performing ADLs    Status  Achieved      PT SHORT TERM GOAL #2   Title  Pt will report improved L cervical pain </= 5/10 daily performing ADLS      PT SHORT TERM GOAL #3   Title  Pt will report improved L UE/hand  numbness to be present only 50% of the time with performing ADLs    Baseline  off and on, min    Status  Achieved        PT Long Term Goals - 08/04/19 1628      PT LONG TERM GOAL #1   Title  Pt will be able to improve FOTO score to 32% limitation to demo greater functional mobility    Baseline  34%    Status  Not Met (goal was 32%)     PT LONG TERM GOAL #2   Title  Pt will be able to demonstrate L shoulder AROM to 120 degrees to assist with dressing.    Baseline  130 deg    Status  Achieved      PT LONG TERM GOAL #3   Title  Pt will be able to sweep with 75% less difficulty    Status  Achieved      PT LONG TERM GOAL #4   Title  Pt will be able to curl her hair with 75% less difficulty    Status  Achieved            Plan - 08/04/19 1707    Clinical Impression Statement  Patient discharged today, given strengthening that was familiar to her from sessions but not formally added to HEP. Goals met.    PT Treatment/Interventions  Cryotherapy;Ultrasound;Traction;Moist Heat;Iontophoresis 64m/ml Dexamethasone;Electrical Stimulation;Functional mobility training;Neuromuscular re-education;Therapeutic exercise;Therapeutic activities;Patient/family education;Manual techniques;Passive range of motion;Dry needling;Taping    PT Next Visit Plan  DC    PT Home Exercise Plan  see pt education/instruction for further details; chin tucks supine; levator stretch sitting; scap retract sitting.  Bands: ext, row, H Abd, ER /IR green    Consulted and Agree with Plan of Care  Patient       Patient will benefit from skilled therapeutic intervention in order to improve the following deficits and impairments:  Decreased mobility, Hypomobility, Increased muscle spasms, Impaired sensation, Improper body mechanics, Decreased range of motion, Decreased activity tolerance, Decreased strength, Impaired flexibility, Impaired UE functional use, Postural dysfunction, Pain  Visit Diagnosis: Acute pain of right  shoulder  Cervicalgia  Muscle weakness (generalized)  Abnormal posture     Problem List Patient Active Problem List   Diagnosis Date Noted  . Primary localized osteoarthritis of right hip 08/22/2018  .  Carpal tunnel syndrome, bilateral 08/06/2018  . Type 2 diabetes mellitus with peripheral neuropathy ([place]) 08/06/2018  . Degenerative joint disease of right hip 08/06/2018  . Preoperative clearance 03/26/2018  . Upper back pain on left side 10/30/2016  . Encounter for postoperative carotid endarterectomy surveillance 12/07/2015  . Varicosities of leg 06/03/2015  . Acute left lumbar radiculopathy 04/15/2014  . Chronic sciatica of right side 03/17/2013  . Diabetic peripheral neuropathy ([place]) 03/17/2013  . Coronary artery disease   . Carotid artery stenosis s/p CEA 2013 10/16/2011  . Carotid artery occlusion without infarction, right 09/17/2011  . Carotid bruit 08/28/2011  . Gout 04/13/2011  . DM type 2 with diabetic peripheral neuropathy ([place]) 04/02/2008  . Hyperlipidemia associated with type 2 diabetes mellitus ([place]) 04/02/2008  . Essential hypertension 04/02/2008  . DEGENERATIVE DISC DISEASE, LUMBOSACRAL SPINE 04/02/2008    Matthe Sloane 08/04/2019, 5:09 PM  [facility] [address] [place], [place], [address] Phone: [phone]   Fax:  [phone]  Name: April Shaffer MRN: [id number] Date of Birth: May 12, 1950  PHYSICAL THERAPY DISCHARGE SUMMARY  Visits from Start of Care: 10  Current functional level related to goals / functional outcomes: See above    Remaining deficits: End range ER and abduction, flexion.  Strength 4/5 to 4+/5.    Education / Equipment: Posture, pain control, HEP Plan: Patient agrees to discharge.  Patient goals were met. Patient is being discharged due to being pleased with the current functional level.  ?????    Raeford Razor, PT 08/04/19 5:11 PM Phone: [phone] Fax:  [phone]

## 2019-08-04 NOTE — Patient Instructions (Signed)
Access Code: [email or web address]: [email or web address]: 06/08/2021Prepared by: Victorino Dike PaaExercises  Supine Shoulder External Rotation with Resistance - 1 x daily - 7 x weekly - 2 sets - 10 reps - 5 hold  Standing Shoulder Horizontal Abduction with Resistance - 1 x daily - 7 x weekly - 2 sets - 10 reps - 5 hold  Standing Shoulder Extension with Resistance - 1 x daily - 7 x weekly - 2 sets - 10 reps - 5 hold

## 2019-08-05 LAB — CBC WITH DIFFERENTIAL/PLATELET
Basophils Absolute: 0.1 10*3/uL (ref 0.0–0.2)
Basos: 1 %
EOS (ABSOLUTE): 0.2 10*3/uL (ref 0.0–0.4)
Eos: 2 %
Hematocrit: 38.1 % (ref 34.0–46.6)
Hemoglobin: 12.6 g/dL (ref 11.1–15.9)
Immature Grans (Abs): 0 10*3/uL (ref 0.0–0.1)
Immature Granulocytes: 0 %
Lymphocytes Absolute: 2.6 10*3/uL (ref 0.7–3.1)
Lymphs: 33 %
MCH: 30.1 pg (ref 26.6–33.0)
MCHC: 33.1 g/dL (ref 31.5–35.7)
MCV: 91 fL (ref 79–97)
Monocytes Absolute: 0.7 10*3/uL (ref 0.1–0.9)
Monocytes: 8 %
Neutrophils Absolute: 4.5 10*3/uL (ref 1.4–7.0)
Neutrophils: 56 %
Platelets: 259 10*3/uL (ref 150–450)
RBC: 4.18 x10E6/uL (ref 3.77–5.28)
RDW: 14.1 % (ref 11.7–15.4)
WBC: 8 10*3/uL (ref 3.4–10.8)

## 2019-08-05 LAB — COMPREHENSIVE METABOLIC PANEL
ALT: 18 IU/L (ref 0–32)
AST: 17 IU/L (ref 0–40)
Albumin/Globulin Ratio: 1.5 (ref 1.2–2.2)
Albumin: 4.1 g/dL (ref 3.8–4.8)
Alkaline Phosphatase: 109 IU/L (ref 48–121)
BUN/Creatinine Ratio: 18 (ref 12–28)
BUN: 15 mg/dL (ref 8–27)
Bilirubin Total: 0.6 mg/dL (ref 0.0–1.2)
CO2: 23 mmol/L (ref 20–29)
Calcium: 9.4 mg/dL (ref 8.7–10.3)
Chloride: 103 mmol/L (ref 96–106)
Creatinine, Ser: 0.85 mg/dL (ref 0.57–1.00)
GFR calc Af Amer: 82 mL/min/{1.73_m2} (ref >59)
GFR calc non Af Amer: 71 mL/min/{1.73_m2} (ref >59)
Globulin, Total: 2.8 g/dL (ref 1.5–4.5)
Glucose: 93 mg/dL (ref 65–99)
Potassium: 4.3 mmol/L (ref 3.5–5.2)
Sodium: 142 mmol/L (ref 134–144)
Total Protein: 6.9 g/dL (ref 6.0–8.5)

## 2019-08-05 LAB — MICROALBUMIN / CREATININE URINE RATIO
Creatinine, Urine: 44.9 mg/dL
Microalb/Creat Ratio: 1276 mg/g creat — ABNORMAL HIGH (ref 0–29)
Microalbumin, Urine: 573 ug/mL

## 2019-08-05 LAB — LIPID PANEL W/O CHOL/HDL RATIO
Cholesterol, Total: 148 mg/dL (ref 100–199)
HDL: 56 mg/dL (ref >39)
LDL Chol Calc (NIH): 79 mg/dL (ref 0–99)
Triglycerides: 64 mg/dL (ref 0–149)
VLDL Cholesterol Cal: 13 mg/dL (ref 5–40)

## 2019-08-05 LAB — HGB A1C W/O EAG: Hgb A1c MFr Bld: 6.5 % — ABNORMAL HIGH (ref 4.8–5.6)

## 2019-08-07 ENCOUNTER — Ambulatory Visit (INDEPENDENT_AMBULATORY_CARE_PROVIDER_SITE_OTHER): Payer: Medicare HMO | Admitting: Internal Medicine

## 2019-08-07 ENCOUNTER — Encounter: Payer: Self-pay | Admitting: Internal Medicine

## 2019-08-07 VITALS — BP 128/70 | HR 72 | Resp 12 | Ht 63.0 in | Wt 165.0 lb

## 2019-08-07 DIAGNOSIS — G5603 Carpal tunnel syndrome, bilateral upper limbs: Secondary | ICD-10-CM

## 2019-08-07 DIAGNOSIS — Z0000 Encounter for general adult medical examination without abnormal findings: Secondary | ICD-10-CM

## 2019-08-07 DIAGNOSIS — E1142 Type 2 diabetes mellitus with diabetic polyneuropathy: Secondary | ICD-10-CM | POA: Diagnosis not present

## 2019-08-07 DIAGNOSIS — R809 Proteinuria, unspecified: Secondary | ICD-10-CM | POA: Insufficient documentation

## 2019-08-07 DIAGNOSIS — E1169 Type 2 diabetes mellitus with other specified complication: Secondary | ICD-10-CM | POA: Diagnosis not present

## 2019-08-07 DIAGNOSIS — E785 Hyperlipidemia, unspecified: Secondary | ICD-10-CM

## 2019-08-07 DIAGNOSIS — I10 Essential (primary) hypertension: Secondary | ICD-10-CM

## 2019-08-07 DIAGNOSIS — E1129 Type 2 diabetes mellitus with other diabetic kidney complication: Secondary | ICD-10-CM

## 2019-08-07 NOTE — Patient Instructions (Signed)
Can google "advance directives, [place]"  And bring up form from Secretary of State. Print and fill out Or can go to "5 wishes"  Which is also in Spanish and fill out--this costs $5--perhaps easier to use. Designate a Medical Power of Attorney to speak for you if you are unable to speak for yourself when ill or injured  

## 2019-08-07 NOTE — Progress Notes (Signed)
Subjective:    Patient ID: April Shaffer, female   DOB: 04/16/1950, 69 y.o.   MRN: [id number]   HPI   CPE with pap  1.  Pap:  History of hysterectomy for benign reasons.  No pap since about 2013.  Never abnormal.    2.  Mammogram:  Last mammogram in 04/17/2019 and normal.  No family history of breast cancer.  3.  Osteoprevention:  Normal DEXA bone density in 2019.  No family history of osteoporosis.  Takes calcium and vitamin D supplementation.  Does eat cheese and drinks one serving of milk daily.  Is walking regularly, but cannot say for how long.  Received corticosteroid shot in right lateral thigh.  She is recently retired and working on getting a schedule to her day.   4.  Guaiac Cards:  Performed 07/2018 and negative for blood.    5.  Colonoscopy:  Believes she had colonoscopy 2 years ago with Dr. Marland KitchenRonny Flurry on [address].  She had 1-2 polyps.  She is due to return when she is 69 years old    62.  Immunizations:   Immunization History  Administered Date(s) Administered  . Influenza Inj Mdck Quad Pf 01/31/2016  . Influenza Split 12/10/2011  . Influenza Whole 12/28/2007, 11/26/2008, 02/03/2010  . Influenza, High Dose Seasonal PF 11/24/2017, 12/04/2018, 01/20/2019  . Influenza,inj,Quad PF,6+ Mos 12/14/2014, 11/25/2016  . Influenza-Unspecified 12/21/2013, 11/24/2016  . PFIZER SARS-COV-2 Vaccination 04/21/2019, 05/12/2019  . Pneumococcal Conjugate-13 04/15/2014  . Pneumococcal Polysaccharide-23 02/27/2003, 02/03/2010, 06/17/2017  . Tdap 08/08/2010  . Zoster Recombinat (Shingrix) 12/23/2017, 03/11/2018     7.  Glucose/Cholesterol:  A1C at 6.5% and cholesterol panel at goal save for mildly elevated LDL at 79.  Taking Atorvastatin 80 mg daily.    Lipid Panel     Component Value Date/Time   CHOL 148 08/04/2019 0849   TRIG 64 08/04/2019 0849   HDL 56 08/04/2019 0849   CHOLHDL 3 12/07/2014 0806   VLDL 15.0 12/07/2014 0806   LDLCALC 79 08/04/2019 0849   LDLDIRECT 139.7  03/28/2012 1033   LABVLDL 13 08/04/2019 0849     Current Meds  Medication Sig  . acetaminophen (TYLENOL) 650 MG CR tablet Take 1,300 mg by mouth daily.  Marland Kitchen allopurinol (ZYLOPRIM) 300 MG tablet Take 1 tablet (300 mg total) by mouth daily.  Marland Kitchen amLODipine (NORVASC) 10 MG tablet Take 1 tablet (10 mg total) by mouth daily.  Marland Kitchen aspirin EC 81 MG tablet Take 81 mg by mouth daily.  Marland Kitchen atorvastatin (LIPITOR) 80 MG tablet TAKE 1 TABLET BY MOUTH ONCE DAILY WITH  EVENING  MEAL  . blood glucose meter kit and supplies KIT Check sugars twice daily  . carvedilol (COREG CR) 20 MG 24 hr capsule Take 1 capsule (20 mg total) by mouth daily.  . clopidogrel (PLAVIX) 75 MG tablet Take 1 tablet by mouth once daily with breakfast  . cyclobenzaprine (FLEXERIL) 5 MG tablet Take 1 tablet (5 mg total) by mouth 3 (three) times daily as needed for muscle spasms.  Marland Kitchen ezetimibe (ZETIA) 10 MG tablet Take 1 tablet (10 mg total) by mouth daily. NEED OV.  . fexofenadine (ALLEGRA) 180 MG tablet Take 180 mg by mouth daily.  Marland Kitchen gabapentin (NEURONTIN) 300 MG capsule 1 cap by mouth in morning, 1 cap by mouth midday and 2 caps by mouth before bedtime.  Marland Kitchen glucose blood test strip Check sugars twice daily  . metFORMIN (GLUCOPHAGE-XR) 500 MG 24 hr tablet TAKE 1 TABLET BY  MOUTH TWICE DAILY WITH MEALS  . MITIGARE 0.6 MG CAPS TAKE 1 CAPSULE TWICE DAILY AS NEEDED FOR GOUT FLARE AND HOLD ATORVASTATIN ON DAYS TAKING MITIGARE  . traMADol (ULTRAM) 50 MG tablet Take 1 tablet (50 mg total) by mouth every 6 (six) hours as needed for severe pain.   Allergies  Allergen Reactions  . Lisinopril-Hydrochlorothiazide Other (See Comments)    Angioedema  - Tongue swelling   . Codeine Itching  . Oxycodone Itching  . Simvastatin Other (See Comments)    Muscle pain  . Tramadol Itching   Past Medical History:  Diagnosis Date  . Arthritis   . Carotid artery narrowing   . Coronary artery disease    Cardiac catheterization November 2013: 50% ostial LAD  stenosis 50% mid stenosis. 30% disease in the left circumflex.  . Diabetes mellitus, type 2 ([place])   . Hyperlipidemia   . Hypertension   . Onychomycosis of toenail 07/31/2016  . Sleep apnea       Had surgery to correct    Past Surgical History:  Procedure Laterality Date  . ABDOMINAL HYSTERECTOMY    . CARDIAC CATHETERIZATION    . ENDARTERECTOMY  09/27/2011   Procedure: RIGT ENDARTERECTOMY CAROTID;  Surgeon: Mal Misty, MD;  Location: [facility];  Service: Vascular;  Laterality: Right;  . EPIDURAL BLOCK INJECTION  02/2008   Drs. Eulis Manly  . gyn surgery  2004   total hysterectomy for mennorhagia,,salpingoophorectomy  . TONSILLECTOMY    . TOTAL ABDOMINAL HYSTERECTOMY W/ BILATERAL SALPINGOOPHORECTOMY Bilateral 2000  . TOTAL HIP ARTHROPLASTY Right 08/22/2018   Procedure: TOTAL HIP ARTHROPLASTY;  Surgeon: Earlie Server, MD;  Location: [facility];  Service: Orthopedics;  Laterality: Right;  Marland Kitchen VARICOSE VEIN SURGERY  2008   stripping  Bilateral Cataract extraction:  2021 for both--Dr. Sherral Hammers.   Family History  Problem Relation Age of Onset  . Hypertension Sister   . Hypothyroidism Sister   . Cancer Maternal Grandmother        lung  . Hypertension Son   . Hypertension Sister   . Diabetes Brother        lost toe in 2018 with new diagnosis of DM  . Hypertension Son     Social History   Socioeconomic History  . Marital status: Divorced    Spouse name: Vickey Sages  . Number of children: 2  . Years of education: 14  . Highest education level: Not on file  Occupational History  . 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    Comment: [employer]-retired 07/23/2014  Tobacco Use  . Smoking status: Former Smoker    Packs/day: 0.10    Years: 25.00    Pack years: 2.50    Types: Cigarettes    Start date: 05/31/1968    Quit date: 07/07/2019    Years since quitting: 0.0  . Smokeless tobacco: Never Used  . Tobacco comment: pt states she smokes  about 4-5 cigs per --states she contiues to work on it.  No interest in other help now.  Vaping Use  . Vaping Use: Never used  Substance and Sexual Activity  . Alcohol use: Yes    Comment: rare  . Drug use: No  . Sexual activity: Not on file  Other Topics Concern  . Not on file  Social History Narrative   Lives with long term boyfriend (30 years together)   Both sons live in [place]  Strain:   . Difficulty of Paying Living Expenses:   Food Insecurity: No Food Insecurity  . Worried About Charity fundraiser in the Last Year: Never true  . Ran Out of Food in the Last Year: Never true  Transportation Needs: No Transportation Needs  . Lack of Transportation (Medical): No  . Lack of Transportation (Non-Medical): No  Physical Activity:   . Days of Exercise per Week:   . Minutes of Exercise per Session:   Stress:   . Feeling of Stress :   Social Connections:   . Frequency of Communication with Friends and Family:   . Frequency of Social Gatherings with Friends and Family:   . Attends Religious Services:   . Active Member of Clubs or Organizations:   . Attends Archivist Meetings:   Marland Kitchen Marital Status:   Intimate Partner Violence: Not At Risk  . Fear of Current or Ex-Partner: No  . Emotionally Abused: No  . Physically Abused: No  . Sexually Abused: No     Review of Systems  Constitutional: Negative for appetite change and fatigue.  HENT: Positive for dental problem (Looking for dentist.). Negative for hearing loss, rhinorrhea and sore throat.   Eyes: Negative for visual disturbance (Just had cataracts out this year.  Dr. Sherral Hammers.  Not aware of any diabetic changes.).  Respiratory: Negative for cough and shortness of breath.   Cardiovascular: Negative for chest pain, palpitations and leg swelling.  Gastrointestinal: Negative for abdominal pain, blood in stool (No melena), constipation and diarrhea.  Genitourinary:  Negative for dysuria, vaginal bleeding and vaginal discharge.  Musculoskeletal: Negative for arthralgias.  Skin: Negative for rash.  Neurological: Positive for numbness (Fingertips bilaterally with numbness at times. ). Negative for weakness.  Psychiatric/Behavioral: Negative for dysphoric mood. The patient is not nervous/anxious.       Objective:   BP 128/70 (BP Location: Left Arm, Patient Position: Sitting, Cuff Size: Normal)   Pulse 72   Resp 12   Ht '5\' 3"'  (1.6 m)   Wt 165 lb (74.8 kg)   BMI 29.23 kg/m   Physical Exam Constitutional:      Appearance: She is obese.  HENT:     Head: Normocephalic and atraumatic.     Jaw: There is normal jaw occlusion.     Right Ear: Tympanic membrane, ear canal and external ear normal.     Left Ear: Tympanic membrane, ear canal and external ear normal.     Nose: Nose normal.     Mouth/Throat:     Mouth: Mucous membranes are moist.     Pharynx: Oropharynx is clear.     Comments: No uvula from prior surgery for OSA Eyes:     Extraocular Movements: Extraocular movements intact.     Conjunctiva/sclera: Conjunctivae normal.     Pupils: Pupils are equal, round, and reactive to light.  Neck:     Comments: Possibly mild thyromegaly Cardiovascular:     Rate and Rhythm: Normal rate and regular rhythm.     Pulses:          Dorsalis pedis pulses are 1+ on the right side and 1+ on the left side.     Heart sounds: S1 normal and S2 normal. No murmur heard.  No friction rub. No S3 or S4 sounds.      Comments: No carotid bruits.  Carotid, radial, femoral, DP and PT pulses normal and equal.  Pulmonary:     Effort: Pulmonary effort is normal.  Breath sounds: Normal breath sounds.  Chest:     Breasts:        Right: No inverted nipple, mass, nipple discharge, skin change or tenderness.        Left: No inverted nipple, mass, nipple discharge, skin change or tenderness.  Abdominal:     General: Bowel sounds are normal.     Palpations: Abdomen is  soft. There is no hepatomegaly, splenomegaly or mass.     Tenderness: There is no abdominal tenderness.     Hernia: No hernia is present.  Genitourinary:    Comments: Deferred as no uterus or ovaries. Musculoskeletal:        General: Normal range of motion.     Cervical back: Full passive range of motion without pain, normal range of motion and neck supple.     Right foot: Deformity (Multiple hammertoes) present.     Left foot: Deformity (Multiple hammertoes) present.  Feet:     Right foot:     Protective Sensation: 10 sites sensed.     Skin integrity: Skin integrity normal.     Toenail Condition: Right toenails are normal.     Left foot:     Protective Sensation: 10 sites sensed.     Skin integrity: Skin integrity normal.     Toenail Condition: Left toenails are normal.     Comments: Varicosities of legs and feet. Lymphadenopathy:     Head:     Right side of head: No submental or submandibular adenopathy.     Left side of head: No submental or submandibular adenopathy.     Cervical: No cervical adenopathy.     Upper Body:     Right upper body: No supraclavicular or axillary adenopathy.     Left upper body: No supraclavicular or axillary adenopathy.     Lower Body: No right inguinal adenopathy. No left inguinal adenopathy.  Skin:    General: Skin is warm.     Capillary Refill: Capillary refill takes 2 to 3 seconds.  Neurological:     General: No focal deficit present.     Mental Status: She is alert and oriented to person, place, and time.     Cranial Nerves: Cranial nerves are intact.     Sensory: Sensation is intact.     Motor: Motor function is intact.     Coordination: Coordination is intact.     Gait: Gait is intact.     Deep Tendon Reflexes: Reflexes are normal and symmetric.     Comments: + Tinels over both median nerves at volar wrists.    Psychiatric:        Attention and Perception: Attention normal.        Mood and Affect: Mood normal.        Behavior: Behavior  normal.        Thought Content: Thought content normal.        Cognition and Memory: Cognition normal.        Judgment: Judgment normal.      Assessment & Plan  1.  CPE without pap Guaiac cards x 3 to return in 2 weeks. Influenza vaccine in the fall. Dental options printout given.  2.  DM:  Controlled.  Discussed does have microalbuminuria  3.  Hyperlipidemia:  Almost at goal.  To work on diet and increase physical activity.  Recheck in 6 months.  4.  Hypertension:  Controlled.    5.  Bilateral Carpal Tunnel syndrome:  Rather than increasing Gabapentin, recommended  wearing cock up splints for hands nightly until tingling in fingertips resolves.  6.  Toenail care in patient with peripheral neuropathy:  Would like to go to podiatry regular for this as she feels she is cutting her toenails incorrectly.

## 2019-08-11 ENCOUNTER — Ambulatory Visit: Payer: Medicare HMO | Admitting: Rehabilitative and Restorative Service Providers"

## 2019-08-25 ENCOUNTER — Other Ambulatory Visit: Payer: Self-pay

## 2019-08-25 ENCOUNTER — Other Ambulatory Visit (INDEPENDENT_AMBULATORY_CARE_PROVIDER_SITE_OTHER): Payer: Medicare HMO

## 2019-08-25 DIAGNOSIS — Z1211 Encounter for screening for malignant neoplasm of colon: Secondary | ICD-10-CM

## 2019-08-25 LAB — POC HEMOCCULT BLD/STL (HOME/3-CARD/SCREEN)
Card #2 Fecal Occult Blod, POC: NEGATIVE
Card #3 Fecal Occult Blood, POC: NEGATIVE
Fecal Occult Blood, POC: NEGATIVE

## 2019-09-07 ENCOUNTER — Other Ambulatory Visit: Payer: Self-pay

## 2019-09-07 ENCOUNTER — Ambulatory Visit (INDEPENDENT_AMBULATORY_CARE_PROVIDER_SITE_OTHER): Payer: Medicare HMO | Admitting: Podiatrist

## 2019-09-07 ENCOUNTER — Encounter: Payer: Self-pay | Admitting: Podiatrist

## 2019-09-07 VITALS — Temp 96.5°F

## 2019-09-07 DIAGNOSIS — L600 Ingrowing nail: Secondary | ICD-10-CM | POA: Diagnosis not present

## 2019-09-07 DIAGNOSIS — E1142 Type 2 diabetes mellitus with diabetic polyneuropathy: Secondary | ICD-10-CM

## 2019-09-07 NOTE — Patient Instructions (Signed)
Diabetes Mellitus and Foot Care Foot care is an important part of your health, especially when you have diabetes. Diabetes may cause you to have problems because of poor blood flow (circulation) to your feet and legs, which can cause your skin to:  Become thinner and drier.  Break more easily.  Heal more slowly.  Peel and crack. You may also have nerve damage (neuropathy) in your legs and feet, causing decreased feeling in them. This means that you may not notice minor injuries to your feet that could lead to more serious problems. Noticing and addressing any potential problems early is the best way to prevent future foot problems. How to care for your feet Foot hygiene  Wash your feet daily with warm water and mild soap. Do not use hot water. Then, pat your feet and the areas between your toes until they are completely dry. Do not soak your feet as this can dry your skin.  Trim your toenails straight across. Do not dig under them or around the cuticle. File the edges of your nails with an emery board or nail file.  Apply a moisturizing lotion or petroleum jelly to the skin on your feet and to dry, brittle toenails. Use lotion that does not contain alcohol and is unscented. Do not apply lotion between your toes. Shoes and socks  Wear clean socks or stockings every day. Make sure they are not too tight. Do not wear knee-high stockings since they may decrease blood flow to your legs.  Wear shoes that fit properly and have enough cushioning. Always look in your shoes before you put them on to be sure there are no objects inside.  To break in new shoes, wear them for just a few hours a day. This prevents injuries on your feet. Wounds, scrapes, corns, and calluses  Check your feet daily for blisters, cuts, bruises, sores, and redness. If you cannot see the bottom of your feet, use a mirror or ask someone for help.  Do not cut corns or calluses or try to remove them with medicine.  If you  find a minor scrape, cut, or break in the skin on your feet, keep it and the skin around it clean and dry. You may clean these areas with mild soap and water. Do not clean the area with peroxide, alcohol, or iodine.  If you have a wound, scrape, corn, or callus on your foot, look at it several times a day to make sure it is healing and not infected. Check for: ? Redness, swelling, or pain. ? Fluid or blood. ? Warmth. ? Pus or a bad smell. General instructions  Do not cross your legs. This may decrease blood flow to your feet.  Do not use heating pads or hot water bottles on your feet. They may burn your skin. If you have lost feeling in your feet or legs, you may not know this is happening until it is too late.  Protect your feet from hot and cold by wearing shoes, such as at the beach or on hot pavement.  Schedule a complete foot exam at least once a year (annually) or more often if you have foot problems. If you have foot problems, report any cuts, sores, or bruises to your health care provider immediately. Contact a health care provider if:  You have a medical condition that increases your risk of infection and you have any cuts, sores, or bruises on your feet.  You have an injury that is not   healing.  You have redness on your legs or feet.  You feel burning or tingling in your legs or feet.  You have pain or cramps in your legs and feet.  Your legs or feet are numb.  Your feet always feel cold.  You have pain around a toenail. Get help right away if:  You have a wound, scrape, corn, or callus on your foot and: ? You have pain, swelling, or redness that gets worse. ? You have fluid or blood coming from the wound, scrape, corn, or callus. ? Your wound, scrape, corn, or callus feels warm to the touch. ? You have pus or a bad smell coming from the wound, scrape, corn, or callus. ? You have a fever. ? You have a red line going up your leg. Summary  Check your feet every day  for cuts, sores, red spots, swelling, and blisters.  Moisturize feet and legs daily.  Wear shoes that fit properly and have enough cushioning.  If you have foot problems, report any cuts, sores, or bruises to your health care provider immediately.  Schedule a complete foot exam at least once a year (annually) or more often if you have foot problems. This information is not intended to replace advice given to you by your health care provider. Make sure you discuss any questions you have with your health care provider. Document Revised: 11/05/2018 Document Reviewed: 03/16/2016 Elsevier Patient Education  2020 [employer]  

## 2019-09-08 NOTE — Progress Notes (Signed)
  Chief Complaint  Patient presents with  . Diabetes    Requests routine foot care. Most recent A1c = "6-something". No history of diabetic ulcers.  . Peripheral Neuropathy    Neuropathy with numbness and pain in toes. Pain typically occurs at night. Pt takes gabapentin.     HPI: Patient is 69 y.o. female who presents today for the concerns as listed above.    Review of Systems No fevers, chills, nausea, muscle aches, no difficulty breathing, no calf pain, no chest pain or shortness of breath.   Physical Exam  GENERAL APPEARANCE: Alert, conversant. Appropriately groomed. No acute distress.   VASCULAR: Pedal pulses palpable DP and PT bilateral.  Capillary refill time is immediate to all digits,  Proximal to distal cooling it warm to warm.  Digital perfusion adequate.   NEUROLOGIC: sensation is decreased epicritically and protectively to 5.07 monofilament at 2/5 sites bilateral.  Light touch is decreased bilateral, vibratory sensationdecdreased bilateral, achilles tendon reflex is intact bilateral.   MUSCULOSKELETAL: acceptable muscle strength, tone and stability bilateral.  No gross boney pedal deformities noted.  No pain, crepitus or limitation noted with foot and ankle range of motion bilateral.   DERMATOLOGIC: skin is warm, supple, and dry.  No open lesions noted.  No rash, no pre ulcerative lesions.Digital nails are thick, discolored, dystrophic, brittle with subungual debris present and clinically mycotic x 10.      Assessment     ICD-10-CM   1. Type 2 diabetes mellitus with peripheral neuropathy (HCC)  E11.42   2. Onychocryptosis  L60.0      Plan  Debridement of toenails was recommended.  Onychoreduction of symptomatic toenails was performed via nail nipper and power burr without iatrogenic incident.  Patient was instructed on signs and symptoms of infection and was told to call immediately should any of these arise.  She will be seen back in 3 months for at risk routine  foot care.  Marland Kitchen

## 2019-10-09 ENCOUNTER — Ambulatory Visit (HOSPITAL_COMMUNITY)
Admission: RE | Admit: 2019-10-09 | Discharge: 2019-10-09 | Disposition: A | Discharge status: Home / Self Care | Payer: Medicare HMO | Source: Ambulatory Visit | Attending: Surgery | Admitting: Surgery

## 2019-10-09 ENCOUNTER — Other Ambulatory Visit: Payer: Self-pay

## 2019-10-09 ENCOUNTER — Ambulatory Visit (INDEPENDENT_AMBULATORY_CARE_PROVIDER_SITE_OTHER): Payer: Medicare HMO | Admitting: Physician Assistant

## 2019-10-09 VITALS — BP 144/80 | HR 66 | Temp 98.0°F | Resp 16 | Ht 63.0 in | Wt 168.0 lb

## 2019-10-09 DIAGNOSIS — I6521 Occlusion and stenosis of right carotid artery: Secondary | ICD-10-CM | POA: Diagnosis present

## 2019-10-09 NOTE — Progress Notes (Signed)
Office Note     CC:  follow up Requesting Provider:  Mack Hook, MD  HPI: April Shaffer is a 69 y.o. ([birth year]) female who presents for routine follow-up carotid artery stenosis.  She has a history of total occlusion of the right ICA and velocities in the left ICA consistent with a 60 to 79% stenosis.  She is status post right carotid endarterectomy with Dacron patch angioplasty secondary to severe asymptomatic disease.  This was performed on September 27, 2011 by Dr. Kellie Simmering.  She denies any signs or symptoms of stroke including slurring speech, changes in vision, or right-sided weakness.  Denies claudication. Has peripheral neuropathy of both feet.  She is taking an aspirin and a statin daily.  She is following with her primary care physician regularly for management of chronic medical conditions including hypertension and diabetes. She states these problems are stable. Past Medical History:  Diagnosis Date  . Arthritis   . Carotid artery narrowing   . Coronary artery disease    Cardiac catheterization November 2013: 50% ostial LAD stenosis 50% mid stenosis. 30% disease in the left circumflex.  . Diabetes mellitus, type 2 ([place])   . Hyperlipidemia   . Hypertension   . Onychomycosis of toenail 07/31/2016  . Sleep apnea       Had surgery to correct    Past Surgical History:  Procedure Laterality Date  . ABDOMINAL HYSTERECTOMY    . CARDIAC CATHETERIZATION    . CATARACT EXTRACTION, BILATERAL Bilateral 2021   Dr. Sherral Hammers  . ENDARTERECTOMY  09/27/2011   Procedure: RIGT ENDARTERECTOMY CAROTID;  Surgeon: Mal Misty, MD;  Location: [facility];  Service: Vascular;  Laterality: Right;  . EPIDURAL BLOCK INJECTION  02/2008   Drs. Eulis Manly  . gyn surgery  2004   total hysterectomy for mennorhagia,,salpingoophorectomy  . TONSILLECTOMY    . TOTAL ABDOMINAL HYSTERECTOMY W/ BILATERAL SALPINGOOPHORECTOMY Bilateral 2000  . TOTAL HIP ARTHROPLASTY Right 08/22/2018   Procedure: TOTAL  HIP ARTHROPLASTY;  Surgeon: Earlie Server, MD;  Location: [facility];  Service: Orthopedics;  Laterality: Right;  Marland Kitchen VARICOSE VEIN SURGERY  2008   stripping    Social History   Socioeconomic History  . Marital status: Divorced    Spouse name: Vickey Sages  . Number of children: 2  . Years of education: 32  . Highest education level: Not on file  Occupational History  . 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    Comment: [employer]-retired 07/23/2014  Tobacco Use  . Smoking status: Former Smoker    Packs/day: 0.10    Years: 25.00    Pack years: 2.50    Types: Cigarettes    Start date: 05/31/1968    Quit date: 07/07/2019    Years since quitting: 0.2  . Smokeless tobacco: Never Used  . Tobacco comment: pt states she smokes about 4-5 cigs per --states she contiues to work on it.  No interest in other help now.  Vaping Use  . Vaping Use: Never used  Substance and Sexual Activity  . Alcohol use: Yes    Comment: rare  . Drug use: No  . Sexual activity: Not on file  Other Topics Concern  . Not on file  Social History Narrative   Lives with long term boyfriend (30 years together)   Both sons live in [place]   Social Determinants of Health   Financial Resource Strain:   . Difficulty of Paying Living Expenses:   Food Insecurity: No  Food Insecurity  . Worried About Charity fundraiser in the Last Year: Never true  . Ran Out of Food in the Last Year: Never true  Transportation Needs: No Transportation Needs  . Lack of Transportation (Medical): No  . Lack of Transportation (Non-Medical): No  Physical Activity:   . Days of Exercise per Week:   . Minutes of Exercise per Session:   Stress:   . Feeling of Stress :   Social Connections:   . Frequency of Communication with Friends and Family:   . Frequency of Social Gatherings with Friends and Family:   . Attends Religious Services:   . Active Member of Clubs or Organizations:   . Attends  Archivist Meetings:   Marland Kitchen Marital Status:   Intimate Partner Violence: Not At Risk  . Fear of Current or Ex-Partner: No  . Emotionally Abused: No  . Physically Abused: No  . Sexually Abused: No   Family History  Problem Relation Age of Onset  . Hypertension Sister   . Hypothyroidism Sister   . Cancer Maternal Grandmother        lung  . Hypertension Son   . Hypertension Sister   . Diabetes Brother        lost toe in 2018 with new diagnosis of DM  . Hypertension Son     Current Outpatient Medications  Medication Sig Dispense Refill  . acetaminophen (TYLENOL) 650 MG CR tablet Take 1,300 mg by mouth daily.    Marland Kitchen allopurinol (ZYLOPRIM) 300 MG tablet Take 1 tablet (300 mg total) by mouth daily. 90 tablet 3  . amLODipine (NORVASC) 10 MG tablet Take 1 tablet (10 mg total) by mouth daily. 90 tablet 3  . aspirin EC 81 MG tablet Take 81 mg by mouth daily.    Marland Kitchen atorvastatin (LIPITOR) 80 MG tablet TAKE 1 TABLET BY MOUTH ONCE DAILY WITH  EVENING  MEAL 90 tablet 3  . blood glucose meter kit and supplies KIT Check sugars twice daily 1 each 0  . Calcium Carb-Cholecalciferol (CALCIUM 500 + D3 PO) Take 1 Units by mouth 2 (two) times daily.     . carvedilol (COREG CR) 20 MG 24 hr capsule Take 1 capsule (20 mg total) by mouth daily. 90 capsule 3  . clopidogrel (PLAVIX) 75 MG tablet Take 1 tablet by mouth once daily with breakfast 90 tablet 3  . cyclobenzaprine (FLEXERIL) 5 MG tablet Take 1 tablet (5 mg total) by mouth 3 (three) times daily as needed for muscle spasms. 20 tablet 1  . ezetimibe (ZETIA) 10 MG tablet Take 1 tablet (10 mg total) by mouth daily. NEED OV. 180 tablet 0  . fexofenadine (ALLEGRA) 180 MG tablet Take 180 mg by mouth daily.    Marland Kitchen gabapentin (NEURONTIN) 300 MG capsule 1 cap by mouth in morning, 1 cap by mouth midday and 2 caps by mouth before bedtime. 360 capsule 3  . glucose blood test strip Check sugars twice daily 100 each 11  . Liniments (SALONPAS PAIN RELIEF PATCH EX)  Place 1 patch onto the skin daily as needed.     . metFORMIN (GLUCOPHAGE-XR) 500 MG 24 hr tablet TAKE 1 TABLET BY MOUTH TWICE DAILY WITH MEALS 180 tablet 3  . MITIGARE 0.6 MG CAPS TAKE 1 CAPSULE TWICE DAILY AS NEEDED FOR GOUT FLARE AND HOLD ATORVASTATIN ON DAYS TAKING MITIGARE 90 capsule 3  . traMADol (ULTRAM) 50 MG tablet Take 1 tablet (50 mg total) by mouth every 6 (six)  hours as needed for severe pain. 12 tablet 0   No current facility-administered medications for this visit.    Allergies  Allergen Reactions  . Lisinopril-Hydrochlorothiazide Other (See Comments)    Angioedema  - Tongue swelling   . Codeine Itching  . Oxycodone Itching  . Simvastatin Other (See Comments)    Muscle pain  . Tramadol Itching     REVIEW OF SYSTEMS:   '[X]'  denotes positive finding, '[ ]'  denotes negative finding Cardiac  Comments:  Chest pain or chest pressure:    Shortness of breath upon exertion:    Short of breath when lying flat:    Irregular heart rhythm:        Vascular    Pain in calf, thigh, or hip brought on by ambulation:    Pain in feet at night that wakes you up from your sleep:     Blood clot in your veins:    Leg swelling:         Pulmonary    Oxygen at home:    Productive cough:     Wheezing:         Neurologic    Sudden weakness in arms or legs:     Sudden numbness in arms or legs:     Sudden onset of difficulty speaking or slurred speech:    Temporary loss of vision in one eye:     Problems with dizziness:         Gastrointestinal    Blood in stool:     Vomited blood:         Genitourinary    Burning when urinating:     Blood in urine:        Psychiatric    Major depression:         Hematologic    Bleeding problems:    Problems with blood clotting too easily:        Skin    Rashes or ulcers:        Constitutional    Fever or chills:      PHYSICAL EXAMINATION:  Vitals:   10/09/19 1400 10/09/19 1402  BP: (!) 146/73 (!) 144/80  Pulse: 67 66  Resp: 16    Temp: 98 F (36.7 C)   SpO2: 99%    General:  WDWN in NAD; vital signs documented above Gait: Unaided, no ataxia HENT: WNL, normocephalic Pulmonary: normal non-labored breathing , without Rales, rhonchi,  wheezing Cardiac: regular HR, without  Murmurs without carotid bruit Abdomen: soft, NT, no masses Skin: without rashes Vascular Exam/Pulses: 2+ and equal brachial and radial pulses Extremities: without ischemic changes, without Gangrene , without cellulitis; without open wounds; both feet warm and well perfused Musculoskeletal: no muscle wasting or atrophy  Neurologic: A&O X 3;  No focal weakness or paresthesias are detected Psychiatric:  The pt has Normal affect.   Non-Invasive Vascular Imaging:   10/09/2019 Right Carotid: Evidence consistent with a total occlusion of the right  ICA. Non-hemodynamically significant plaque <50% noted in the  CCA.   Left Carotid: Velocities in the left ICA are consistent with a 60-79%  stenosis. Non-hemodynamically significant plaque <50% noted in the  CCA. The ECA appears >50% stenosed.   Vertebrals: Bilateral vertebral arteries demonstrate antegrade flow.  Subclavians: Normal flow hemodynamics were seen in bilateral subclavian  arteries.   Incidental findings: Enlarged left thyroid gland, measuring 5.1 x 2.8 x  3.3 cm. The right thyroid gland measures 3.9 x 1.0 x 1.5 cm.  ASSESSMENT/PLAN:: 69 y.o. female here for follow up for carotid artery stenosis.  Her duplex examination has been stable for the past 5 years.  She has no symptoms.  Continue aspirin and statin daily.  We reviewed signs and symptoms of stroke/TIA and she is advised to seek immediate medical attention should these occur.  Incidental thyroid enlargement noted on ultrasound today.  Will forward this information to Dr. Amil Amen her primary care physician.  Recheck in 1 year with duplex centimeters.  Barbie Banner, PA-C [facility] [phone]  Clinic MD:   Donzetta Matters

## 2019-10-19 ENCOUNTER — Other Ambulatory Visit: Payer: Self-pay | Admitting: Cardiovascular Disease

## 2019-10-30 ENCOUNTER — Ambulatory Visit: Payer: Medicare HMO | Admitting: Cardiovascular Disease

## 2019-12-08 ENCOUNTER — Ambulatory Visit (INDEPENDENT_AMBULATORY_CARE_PROVIDER_SITE_OTHER): Payer: Medicare HMO | Admitting: Cardiovascular Disease

## 2019-12-08 ENCOUNTER — Other Ambulatory Visit: Payer: Self-pay

## 2019-12-08 ENCOUNTER — Encounter: Payer: Self-pay | Admitting: Cardiovascular Disease

## 2019-12-08 VITALS — BP 150/72 | HR 65 | Ht 63.0 in | Wt 167.0 lb

## 2019-12-08 DIAGNOSIS — I6521 Occlusion and stenosis of right carotid artery: Secondary | ICD-10-CM | POA: Diagnosis not present

## 2019-12-08 DIAGNOSIS — I10 Essential (primary) hypertension: Secondary | ICD-10-CM | POA: Diagnosis not present

## 2019-12-08 DIAGNOSIS — I2510 Atherosclerotic heart disease of native coronary artery without angina pectoris: Secondary | ICD-10-CM | POA: Diagnosis not present

## 2019-12-08 DIAGNOSIS — E1169 Type 2 diabetes mellitus with other specified complication: Secondary | ICD-10-CM | POA: Diagnosis not present

## 2019-12-08 DIAGNOSIS — E785 Hyperlipidemia, unspecified: Secondary | ICD-10-CM

## 2019-12-08 NOTE — Assessment & Plan Note (Signed)
Noncritical CAD by cardiac catheterization performed by Dr. Kirke Corin in [facility] 01/16/2012.  She did have a Myoview stress test performed for preoperative clearance before a total hip replacement 03/28/2018 which was normal.  She denies chest pain.

## 2019-12-08 NOTE — Assessment & Plan Note (Signed)
History of carotid artery disease status post remote right carotid endarterectomy by Dr. Myra Gianotti with carotid Dopplers recently performed 10/09/2019 revealing an occluded right internal carotid artery with moderately severe left ICA stenosis which Dr. Myra Gianotti is following closely as an outpatient.

## 2019-12-08 NOTE — Assessment & Plan Note (Signed)
History of essential hypertension a blood pressure measured today 150/72.  She is on amlodipine and carvedilol.

## 2019-12-08 NOTE — Assessment & Plan Note (Signed)
History of hyper lipidemia on Zetia and atorvastatin with lipid profile performed 08/04/2019 revealing total cholesterol 148, LDL 79 and HDL 56.

## 2019-12-08 NOTE — Patient Instructions (Signed)

## 2019-12-08 NOTE — Progress Notes (Signed)
12/08/2019 ARDENE REMLEY   Jul 11, 1950  [id number]  Primary Physician Mack Hook, MD Primary Cardiologist: Lorretta Harp MD Lupe Carney, [place]  HPI:  DAVON FOLTA is a 69 y.o.  moderately overweight divorced African-American female mother of 2, grandmother of 37 grandchildren who is retired from being a Secretary/administrator.  She was referred by Dr. Amil Amen for preoperative clearance before elective total hip replacement.  I last saw her in the office 03/26/2018. Risk factors include 20-pack-year tobacco abuse currently smoking one third of a pack a day.  She has treated hypertension, diabetes and hyperlipidemia.  She is never had a heart attack or stroke although she does have known carotid artery disease with Dopplers performed 03/03/2018 revealing an occluded right internal carotid artery and moderate left ICA stenosis.  She did have a positive Myoview 12/31/2011 which led to a diagnostic cath by Dr. Fletcher Anon in [facility] 01/16/2012 that showed noncritical CAD.  Medical therapy was recommended.  She underwent Myoview stress testing for preoperative clearance before her right total hip replacement on 03/28/2018 which was entirely normal.  She had a hip replacement done June 2020 and has recuperated nicely from this.  She is otherwise asymptomatic.   Current Meds  Medication Sig  . acetaminophen (TYLENOL) 650 MG CR tablet Take 1,300 mg by mouth daily.  Marland Kitchen allopurinol (ZYLOPRIM) 300 MG tablet Take 1 tablet (300 mg total) by mouth daily.  Marland Kitchen amLODipine (NORVASC) 10 MG tablet Take 1 tablet (10 mg total) by mouth daily.  Marland Kitchen aspirin EC 81 MG tablet Take 81 mg by mouth daily.  Marland Kitchen atorvastatin (LIPITOR) 80 MG tablet TAKE 1 TABLET BY MOUTH ONCE DAILY WITH  EVENING  MEAL  . blood glucose meter kit and supplies KIT Check sugars twice daily  . Calcium Carb-Cholecalciferol (CALCIUM 500 + D3 PO) Take 1 Units by mouth 2 (two) times daily.   . carvedilol (COREG CR) 20 MG 24 hr capsule Take 1 capsule  (20 mg total) by mouth daily.  . celecoxib (CELEBREX) 100 MG capsule   . clopidogrel (PLAVIX) 75 MG tablet Take 1 tablet by mouth once daily with breakfast  . cyclobenzaprine (FLEXERIL) 5 MG tablet Take 1 tablet (5 mg total) by mouth 3 (three) times daily as needed for muscle spasms.  Marland Kitchen ezetimibe (ZETIA) 10 MG tablet TAKE 1 TABLET EVERY DAY (NEED MD APPOINTMENT)  . fexofenadine (ALLEGRA) 180 MG tablet Take 180 mg by mouth daily.  Marland Kitchen gabapentin (NEURONTIN) 300 MG capsule 1 cap by mouth in morning, 1 cap by mouth midday and 2 caps by mouth before bedtime. (Patient taking differently: Take 300 mg by mouth 2 (two) times daily. 1 cap by mouth in morning,  2 caps by mouth before bedtime.)  . glucose blood test strip Check sugars twice daily  . HYDROcodone-acetaminophen (NORCO/VICODIN) 5-325 MG tablet   . Liniments (SALONPAS PAIN RELIEF PATCH EX) Place 1 patch onto the skin daily as needed.   . metFORMIN (GLUCOPHAGE-XR) 500 MG 24 hr tablet TAKE 1 TABLET BY MOUTH TWICE DAILY WITH MEALS  . MITIGARE 0.6 MG CAPS TAKE 1 CAPSULE TWICE DAILY AS NEEDED FOR GOUT FLARE AND HOLD ATORVASTATIN ON DAYS TAKING MITIGARE  . traMADol (ULTRAM) 50 MG tablet Take 1 tablet (50 mg total) by mouth every 6 (six) hours as needed for severe pain.     Allergies  Allergen Reactions  . Lisinopril-Hydrochlorothiazide Other (See Comments)    Angioedema  - Tongue swelling   . Codeine Itching  .  Oxycodone Itching  . Simvastatin Other (See Comments)    Muscle pain  . Tramadol Itching    Social History   Socioeconomic History  . Marital status: Divorced    Spouse name: Vickey Sages  . Number of children: 2  . Years of education: 56  . Highest education level: Not on file  Occupational History  . 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    Comment: [employer]-retired 07/23/2014  Tobacco Use  . Smoking status: Former Smoker    Packs/day: 0.10    Years: 25.00    Pack years: 2.50     Types: Cigarettes    Start date: 05/31/1968    Quit date: 07/07/2019    Years since quitting: 0.4  . Smokeless tobacco: Never Used  . Tobacco comment: pt states she smokes about 4-5 cigs per --states she contiues to work on it.  No interest in other help now.  Vaping Use  . Vaping Use: Never used  Substance and Sexual Activity  . Alcohol use: Yes    Comment: rare  . Drug use: No  . Sexual activity: Not on file  Other Topics Concern  . Not on file  Social History Narrative   Lives with long term boyfriend (30 years together)   Both sons live in [place]   Social Determinants of Health   Financial Resource Strain:   . Difficulty of Paying Living Expenses: Not on file  Food Insecurity: No Food Insecurity  . Worried About Charity fundraiser in the Last Year: Never true  . Ran Out of Food in the Last Year: Never true  Transportation Needs: No Transportation Needs  . Lack of Transportation (Medical): No  . Lack of Transportation (Non-Medical): No  Physical Activity:   . Days of Exercise per Week: Not on file  . Minutes of Exercise per Session: Not on file  Stress:   . Feeling of Stress : Not on file  Social Connections:   . Frequency of Communication with Friends and Family: Not on file  . Frequency of Social Gatherings with Friends and Family: Not on file  . Attends Religious Services: Not on file  . Active Member of Clubs or Organizations: Not on file  . Attends Archivist Meetings: Not on file  . Marital Status: Not on file  Intimate Partner Violence: Not At Risk  . Fear of Current or Ex-Partner: No  . Emotionally Abused: No  . Physically Abused: No  . Sexually Abused: No     Review of Systems: General: negative for chills, fever, night sweats or weight changes.  Cardiovascular: negative for chest pain, dyspnea on exertion, edema, orthopnea, palpitations, paroxysmal nocturnal dyspnea or shortness of breath Dermatological: negative for rash Respiratory:  negative for cough or wheezing Urologic: negative for hematuria Abdominal: negative for nausea, vomiting, diarrhea, bright red blood per rectum, melena, or hematemesis Neurologic: negative for visual changes, syncope, or dizziness All other systems reviewed and are otherwise negative except as noted above.    Blood pressure (!) 150/72, pulse 65, height _0  (1.6 m), weight 167 lb (75.8 kg), SpO2 98 %.  General appearance: alert and no distress Neck: no adenopathy, no JVD, supple, symmetrical, trachea midline, thyroid not enlarged, symmetric, no tenderness/mass/nodules and Left carotid bruit Lungs: clear to auscultation bilaterally Heart: regular rate and rhythm, S1, S2 normal, no murmur, click, rub or gallop Extremities: extremities normal, atraumatic, no cyanosis or edema Pulses: 2+ and symmetric Skin: Skin color,  texture, turgor normal. No rashes or lesions Neurologic: Alert and oriented X 3, normal strength and tone. Normal symmetric reflexes. Normal coordination and gait  EKG normal sinus rhythm 65 with septal Q waves.  I personally reviewed this EKG.  ASSESSMENT AND PLAN:   Hyperlipidemia associated with type 2 diabetes mellitus ([place]) History of hyper lipidemia on Zetia and atorvastatin with lipid profile performed 08/04/2019 revealing total cholesterol 148, LDL 79 and HDL 56.  Essential hypertension History of essential hypertension a blood pressure measured today 150/72.  She is on amlodipine and carvedilol.  Carotid artery occlusion without infarction, right History of carotid artery disease status post remote right carotid endarterectomy by Dr. Trula Slade with carotid Dopplers recently performed 10/09/2019 revealing an occluded right internal carotid artery with moderately severe left ICA stenosis which Dr. Trula Slade is following closely as an outpatient.  Coronary artery disease Noncritical CAD by cardiac catheterization performed by Dr. Fletcher Anon in [facility] 01/16/2012.  She did  have a Myoview stress test performed for preoperative clearance before a total hip replacement 03/28/2018 which was normal.  She denies chest pain.      Lorretta Harp MD FACP,FACC,FAHA, [facility] 12/08/2019 9:20 AM

## 2019-12-11 ENCOUNTER — Other Ambulatory Visit: Payer: Self-pay | Admitting: Cardiovascular Disease

## 2019-12-15 ENCOUNTER — Encounter: Payer: Self-pay | Admitting: Podiatry

## 2019-12-15 ENCOUNTER — Other Ambulatory Visit: Payer: Self-pay

## 2019-12-15 ENCOUNTER — Ambulatory Visit (INDEPENDENT_AMBULATORY_CARE_PROVIDER_SITE_OTHER): Payer: Medicare HMO | Admitting: Podiatry

## 2019-12-15 DIAGNOSIS — L600 Ingrowing nail: Secondary | ICD-10-CM | POA: Diagnosis not present

## 2019-12-15 DIAGNOSIS — E1142 Type 2 diabetes mellitus with diabetic polyneuropathy: Secondary | ICD-10-CM | POA: Diagnosis not present

## 2019-12-15 NOTE — Progress Notes (Signed)
This patient returns to my office for at risk foot care.  This patient requires this care by a professional since this patient will be at risk due to having type 2 diabetes with neuropathy.   This patient is unable to cut nails himself since the patient cannot reach his nails.These nails are painful walking and wearing shoes.  This patient presents for at risk foot care today.  General Appearance  Alert, conversant and in no acute stress.  Vascular  Dorsalis pedis and posterior tibial  pulses are palpable  bilaterally.  Capillary return is within normal limits  bilaterally. Temperature is within normal limits  bilaterally.  Neurologic  Senn-Weinstein monofilament wire test decreased.  bilaterally. Muscle power within normal limits bilaterally.  Nails Thick disfigured discolored hallux nails with marked incurvation hallux nails  B/L.  Marland Kitchen No evidence of bacterial infection or drainage bilaterally.  Orthopedic  No limitations of motion  feet .  No crepitus or effusions noted.  No bony pathology or digital deformities noted.  Skin  normotropic skin with no porokeratosis noted bilaterally.  No signs of infections or ulcers noted.     Onychomycosis  Pain in right toes  Pain in left toes  Consent was obtained for treatment procedures.   Mechanical debridement of hallux nails   bilaterally performed with a nail nipper.  Filed with dremel without incident.    Return office visit     3 months                 Told patient to return for periodic foot care and evaluation due to potential at risk complications.   Helane Gunther DPM

## 2019-12-28 ENCOUNTER — Encounter: Payer: Self-pay | Admitting: Internal Medicine

## 2019-12-30 ENCOUNTER — Other Ambulatory Visit: Payer: Self-pay | Admitting: Internal Medicine

## 2020-02-05 ENCOUNTER — Other Ambulatory Visit: Payer: Medicare HMO

## 2020-02-09 ENCOUNTER — Ambulatory Visit: Payer: Medicare HMO | Admitting: Internal Medicine

## 2020-03-07 ENCOUNTER — Other Ambulatory Visit: Payer: Self-pay | Admitting: Neurological Surgery

## 2020-03-07 DIAGNOSIS — M4316 Spondylolisthesis, lumbar region: Secondary | ICD-10-CM

## 2020-03-16 ENCOUNTER — Encounter: Payer: Self-pay | Admitting: Podiatry

## 2020-03-16 ENCOUNTER — Other Ambulatory Visit: Payer: Self-pay

## 2020-03-16 ENCOUNTER — Ambulatory Visit (INDEPENDENT_AMBULATORY_CARE_PROVIDER_SITE_OTHER): Payer: Medicare HMO | Admitting: Podiatry

## 2020-03-16 DIAGNOSIS — E1142 Type 2 diabetes mellitus with diabetic polyneuropathy: Secondary | ICD-10-CM

## 2020-03-16 DIAGNOSIS — L600 Ingrowing nail: Secondary | ICD-10-CM

## 2020-03-16 NOTE — Progress Notes (Signed)
This patient returns to my office for at risk foot care.  This patient requires this care by a professional since this patient will be at risk due to having type 2 diabetes with neuropathy.  This patient is unable to cut nails herself since the patient cannot reach her nails.These nails are painful walking and wearing shoes.  This patient presents for at risk foot care today.  General Appearance  Alert, conversant and in no acute stress.  Vascular  Dorsalis pedis and posterior tibial  pulses are palpable  bilaterally.  Capillary return is within normal limits  bilaterally. Temperature is within normal limits  bilaterally.  Neurologic  Senn-Weinstein monofilament wire test decreased.  bilaterally. Muscle power within normal limits bilaterally.  Nails Thick disfigured discolored hallux nails with marked incurvation hallux nails  B/L.  Marland Kitchen No evidence of bacterial infection or drainage bilaterally.  Orthopedic  No limitations of motion  feet .  No crepitus or effusions noted.  No bony pathology or digital deformities noted.  Skin  normotropic skin with no porokeratosis noted bilaterally.  No signs of infections or ulcers noted.     Onychomycosis  Pain in right toes  Pain in left toes  Consent was obtained for treatment procedures.   Mechanical debridement of hallux nails   bilaterally performed with a nail nipper.  Filed with dremel without incident.    Return office visit     3 months                 Told patient to return for periodic foot care and evaluation due to potential at risk complications.   Helane Gunther DPM

## 2020-03-22 ENCOUNTER — Ambulatory Visit
Admission: RE | Admit: 2020-03-22 | Discharge: 2020-03-22 | Disposition: A | Discharge status: Home / Self Care | Payer: Medicare HMO | Source: Ambulatory Visit | Attending: Neurological Surgery | Admitting: Neurological Surgery

## 2020-03-22 DIAGNOSIS — M4316 Spondylolisthesis, lumbar region: Secondary | ICD-10-CM

## 2020-03-23 ENCOUNTER — Other Ambulatory Visit: Payer: Self-pay | Admitting: Family

## 2020-03-23 DIAGNOSIS — Z1231 Encounter for screening mammogram for malignant neoplasm of breast: Secondary | ICD-10-CM

## 2020-03-29 ENCOUNTER — Telehealth: Payer: Self-pay | Admitting: *Deleted

## 2020-03-29 NOTE — Telephone Encounter (Signed)
   [facility] Pre-operative Risk Assessment    HEARTCARE STAFF: - Please ensure there is not already an duplicate clearance open for this procedure. - Under Visit Info/Reason for Call, type in Other and utilize the format Clearance MM/DD/YY or Clearance TBD. Do not use dashes or single digits. - If request is for dental extraction, please clarify the # of teeth to be extracted.  Request for surgical clearance:  1. What type of surgery is being performed? L1-2, L2-3, L3-4, L4-5 Lumbar Fusion   2. When is this surgery scheduled? TBD   3. What type of clearance is required (medical clearance vs. Pharmacy clearance to hold med vs. Both)? Medical  4. Are there any medications that need to be held prior to surgery and how long? Plavix   5. Practice name and name of physician performing surgery? [facility]   6. What is the office phone number?    7.   What is the office fax number? [phone] Attn:                    [name]  8.   Anesthesia type (None, local, MAC, general) ? General   April Shaffer, April Shaffer 03/29/2020, 11:55 AM  _________________________________________________________________   (provider comments below)

## 2020-03-29 NOTE — Telephone Encounter (Signed)
   Primary Cardiologist: Nanetta Batty, MD  Chart reviewed and patient contacted by phone today as part of pre-operative protocol coverage. Given past medical history and time since last visit, based on ACC/AHA guidelines, April Shaffer would be at acceptable risk for the planned procedure without further cardiovascular testing.   Per previous recommendations-OK to hold Plavix 5-7 days pre op and aspirin 3-5 days pre op.  Resume as soon as safe post op, ideally within 24-48 hours.  The patient was advised that if she develops new symptoms prior to surgery to contact our office to arrange for a follow-up visit, and she verbalized understanding.  I will route this recommendation to the requesting party via Epic fax function and remove from pre-op pool.  Please call with questions.  Corine Shelter, PA-C 03/29/2020, 1:26 PM

## 2020-03-30 ENCOUNTER — Other Ambulatory Visit: Payer: Self-pay | Admitting: Neurological Surgery

## 2020-03-30 DIAGNOSIS — M4310 Spondylolisthesis, site unspecified: Secondary | ICD-10-CM

## 2020-04-04 ENCOUNTER — Other Ambulatory Visit: Payer: Self-pay | Admitting: Internal Medicine

## 2020-04-04 DIAGNOSIS — I779 Disorder of arteries and arterioles, unspecified: Secondary | ICD-10-CM

## 2020-04-04 NOTE — Telephone Encounter (Signed)
I think she transferred care another clinic--please check to be certain.

## 2020-04-05 NOTE — Telephone Encounter (Signed)
Called patient and left a message asking to call back.  

## 2020-04-05 NOTE — Telephone Encounter (Signed)
Spoke with patient and she confirmed that she switched providers and now she's been seen at [facility].

## 2020-04-20 NOTE — Progress Notes (Signed)
Surgical Instructions    Your procedure is scheduled on 04/25/20.  Report to [facility] Main Entrance "A" at 05:30 A.M., then check in with the Admitting office.  Call this number if you have problems the morning of surgery:  [phone]   If you have any questions prior to your surgery date call [phone]: Open Monday-Friday 8am-4pm    Remember:  Do not eat or drink after midnight the night before your surgery     Take these medicines the morning of surgery with A SIP OF WATER  acetaminophen (TYLENOL) if needed allopurinol (ZYLOPRIM) amLODipine (NORVASC)  carvedilol (COREG CR)  ezetimibe (ZETIA) gabapentin (NEURONTIN)   As of today, STOP taking any Aspirin and Plavix (unless otherwise instructed by your surgeon) Aleve, Naproxen, Ibuprofen, Motrin, Advil, Goody's, BC's, all herbal medications, fish oil, and all vitamins.   WHAT DO I DO ABOUT MY DIABETES MEDICATION?   Marland Kitchen Do not take oral diabetes medicines (pills) the morning of surgery. This includes your metformin (glucophage).     HOW TO MANAGE YOUR DIABETES BEFORE AND AFTER SURGERY  Why is it important to control my blood sugar before and after surgery? . Improving blood sugar levels before and after surgery helps healing and can limit problems. . A way of improving blood sugar control is eating a healthy diet by: o  Eating less sugar and carbohydrates o  Increasing activity/exercise o  Talking with your doctor about reaching your blood sugar goals . High blood sugars (greater than 180 mg/dL) can raise your risk of infections and slow your recovery, so you will need to focus on controlling your diabetes during the weeks before surgery. . Make sure that the doctor who takes care of your diabetes knows about your planned surgery including the date and location.  How do I manage my blood sugar before surgery? . Check your blood sugar at least 4 times a day, starting 2 days before surgery, to make sure that the level  is not too high or low. . Check your blood sugar the morning of your surgery when you wake up and every 2 hours until you get to the Short Stay unit. o If your blood sugar is less than 70 mg/dL, you will need to treat for low blood sugar: - Do not take insulin. - Treat a low blood sugar (less than 70 mg/dL) with  cup of clear juice (cranberry or apple), 4 glucose tablets, OR glucose gel. - Recheck blood sugar in 15 minutes after treatment (to make sure it is greater than 70 mg/dL). If your blood sugar is not greater than 70 mg/dL on recheck, call [phone] for further instructions. . Report your blood sugar to the short stay nurse when you get to Short Stay.  . If you are admitted to the hospital after surgery: o Your blood sugar will be checked by the staff and you will probably be given insulin after surgery (instead of oral diabetes medicines) to make sure you have good blood sugar levels. o The goal for blood sugar control after surgery is 80-180 mg/dL.                      Do not wear jewelry, make up, or nail polish            Do not wear lotions, powders, perfumes/colognes, or deodorant.            Do not shave 48 hours prior to surgery.  Do not bring valuables to the hospital.            [facility] is not responsible for any belongings or valuables.  Do NOT Smoke (Tobacco/Vaping) or drink Alcohol 24 hours prior to your procedure If you use a CPAP at night, you may bring all equipment for your overnight stay.   Contacts, glasses, dentures or bridgework may not be worn into surgery, please bring cases for these belongings   For patients admitted to the hospital, discharge time will be determined by your treatment team.   Patients discharged the day of surgery will not be allowed to drive home, and someone needs to stay with them for 24 hours.    Special instructions:   [place]- Preparing For Surgery  Before surgery, you can play an important role. Because  skin is not sterile, your skin needs to be as free of germs as possible. You can reduce the number of germs on your skin by washing with CHG (chlorahexidine gluconate) Soap before surgery.  CHG is an antiseptic cleaner which kills germs and bonds with the skin to continue killing germs even after washing.    Oral Hygiene is also important to reduce your risk of infection.  Remember - BRUSH YOUR TEETH THE MORNING OF SURGERY WITH YOUR REGULAR TOOTHPASTE  Please do not use if you have an allergy to CHG or antibacterial soaps. If your skin becomes reddened/irritated stop using the CHG.  Do not shave (including legs and underarms) for at least 48 hours prior to first CHG shower. It is OK to shave your face.  Please follow these instructions carefully.   1. Shower the NIGHT BEFORE SURGERY and the MORNING OF SURGERY  2. If you chose to wash your hair, wash your hair first as usual with your normal shampoo.  3. After you shampoo, rinse your hair and body thoroughly to remove the shampoo.  4. Wash Face and genitals (private parts) with your normal soap.   5.  Shower the NIGHT BEFORE SURGERY and the MORNING OF SURGERY with CHG Soap.   6. Use CHG Soap as you would any other liquid soap. You can apply CHG directly to the skin and wash gently with a scrungie or a clean washcloth.   7. Apply the CHG Soap to your body ONLY FROM THE NECK DOWN.  Do not use on open wounds or open sores. Avoid contact with your eyes, ears, mouth and genitals (private parts). Wash Face and genitals (private parts)  with your normal soap.   8. Wash thoroughly, paying special attention to the area where your surgery will be performed.  9. Thoroughly rinse your body with warm water from the neck down.  10. DO NOT shower/wash with your normal soap after using and rinsing off the CHG Soap.  11. Pat yourself dry with a CLEAN TOWEL.  12. Wear CLEAN PAJAMAS to bed the night before surgery  13. Place CLEAN SHEETS on your bed the  night before your surgery  14. DO NOT SLEEP WITH PETS.   Day of Surgery: Take a shower. Wear Clean/Comfortable clothing the morning of surgery Do not apply any deodorants/lotions.   Remember to brush your teeth WITH YOUR REGULAR TOOTHPASTE.   Please read over the following fact sheets that you were given.

## 2020-04-21 ENCOUNTER — Ambulatory Visit (HOSPITAL_COMMUNITY)
Admission: RE | Admit: 2020-04-21 | Discharge: 2020-04-21 | Disposition: A | Discharge status: Home / Self Care | Payer: Medicare HMO | Source: Ambulatory Visit | Attending: Neurological Surgery | Admitting: Neurological Surgery

## 2020-04-21 ENCOUNTER — Other Ambulatory Visit: Payer: Self-pay

## 2020-04-21 ENCOUNTER — Encounter (HOSPITAL_COMMUNITY)
Admission: RE | Admit: 2020-04-21 | Discharge: 2020-04-21 | Disposition: A | Discharge status: Home / Self Care | Payer: Medicare HMO | Source: Ambulatory Visit | Attending: Neurological Surgery | Admitting: Neurological Surgery

## 2020-04-21 ENCOUNTER — Other Ambulatory Visit (HOSPITAL_COMMUNITY)
Admission: RE | Admit: 2020-04-21 | Discharge: 2020-04-21 | Disposition: A | Discharge status: Home / Self Care | Payer: Medicare HMO | Source: Ambulatory Visit | Attending: Neurological Surgery | Admitting: Neurological Surgery

## 2020-04-21 ENCOUNTER — Encounter (HOSPITAL_COMMUNITY): Payer: Self-pay

## 2020-04-21 DIAGNOSIS — Z7902 Long term (current) use of antithrombotics/antiplatelets: Secondary | ICD-10-CM | POA: Diagnosis not present

## 2020-04-21 DIAGNOSIS — Z20822 Contact with and (suspected) exposure to covid-19: Secondary | ICD-10-CM | POA: Diagnosis not present

## 2020-04-21 DIAGNOSIS — F17200 Nicotine dependence, unspecified, uncomplicated: Secondary | ICD-10-CM | POA: Insufficient documentation

## 2020-04-21 DIAGNOSIS — M4310 Spondylolisthesis, site unspecified: Secondary | ICD-10-CM

## 2020-04-21 DIAGNOSIS — Z79899 Other long term (current) drug therapy: Secondary | ICD-10-CM | POA: Diagnosis not present

## 2020-04-21 DIAGNOSIS — E785 Hyperlipidemia, unspecified: Secondary | ICD-10-CM | POA: Insufficient documentation

## 2020-04-21 DIAGNOSIS — Z7982 Long term (current) use of aspirin: Secondary | ICD-10-CM | POA: Insufficient documentation

## 2020-04-21 DIAGNOSIS — I2510 Atherosclerotic heart disease of native coronary artery without angina pectoris: Secondary | ICD-10-CM | POA: Diagnosis not present

## 2020-04-21 DIAGNOSIS — G4733 Obstructive sleep apnea (adult) (pediatric): Secondary | ICD-10-CM | POA: Insufficient documentation

## 2020-04-21 DIAGNOSIS — Z01818 Encounter for other preprocedural examination: Secondary | ICD-10-CM | POA: Insufficient documentation

## 2020-04-21 DIAGNOSIS — I10 Essential (primary) hypertension: Secondary | ICD-10-CM | POA: Diagnosis not present

## 2020-04-21 DIAGNOSIS — Z7984 Long term (current) use of oral hypoglycemic drugs: Secondary | ICD-10-CM | POA: Diagnosis not present

## 2020-04-21 DIAGNOSIS — E119 Type 2 diabetes mellitus without complications: Secondary | ICD-10-CM | POA: Insufficient documentation

## 2020-04-21 LAB — CBC WITH DIFFERENTIAL/PLATELET
Abs Immature Granulocytes: 0.01 10*3/uL (ref 0.00–0.07)
Basophils Absolute: 0.1 10*3/uL (ref 0.0–0.1)
Basophils Relative: 1 %
Eosinophils Absolute: 0.2 10*3/uL (ref 0.0–0.5)
Eosinophils Relative: 2 %
HCT: 37.3 % (ref 36.0–46.0)
Hemoglobin: 12.6 g/dL (ref 12.0–15.0)
Immature Granulocytes: 0 %
Lymphocytes Relative: 33 %
Lymphs Abs: 2.5 10*3/uL (ref 0.7–4.0)
MCH: 30.6 pg (ref 26.0–34.0)
MCHC: 33.8 g/dL (ref 30.0–36.0)
MCV: 90.5 fL (ref 80.0–100.0)
Monocytes Absolute: 0.6 10*3/uL (ref 0.1–1.0)
Monocytes Relative: 8 %
Neutro Abs: 4.3 10*3/uL (ref 1.7–7.7)
Neutrophils Relative %: 56 %
Platelets: 258 10*3/uL (ref 150–400)
RBC: 4.12 MIL/uL (ref 3.87–5.11)
RDW: 14.5 % (ref 11.5–15.5)
WBC: 7.6 10*3/uL (ref 4.0–10.5)
nRBC: 0 % (ref 0.0–0.2)

## 2020-04-21 LAB — HEMOGLOBIN A1C
Hgb A1c MFr Bld: 6.6 % — ABNORMAL HIGH (ref 4.8–5.6)
Mean Plasma Glucose: 142.72 mg/dL

## 2020-04-21 LAB — BASIC METABOLIC PANEL
Anion gap: 10 (ref 5–15)
BUN: 8 mg/dL (ref 8–23)
CO2: 25 mmol/L (ref 22–32)
Calcium: 8.9 mg/dL (ref 8.9–10.3)
Chloride: 102 mmol/L (ref 98–111)
Creatinine, Ser: 0.8 mg/dL (ref 0.44–1.00)
GFR, Estimated: >60 mL/min (ref >60)
Glucose, Bld: 102 mg/dL — ABNORMAL HIGH (ref 70–99)
Potassium: 3.2 mmol/L — ABNORMAL LOW (ref 3.5–5.1)
Sodium: 137 mmol/L (ref 135–145)

## 2020-04-21 LAB — SARS CORONAVIRUS 2 (TAT 6-24 HRS): SARS Coronavirus 2: NEGATIVE

## 2020-04-21 LAB — TYPE AND SCREEN
ABO/RH(D): O POS
Antibody Screen: NEGATIVE

## 2020-04-21 LAB — SURGICAL PCR SCREEN
MRSA, PCR: NEGATIVE
Staphylococcus aureus: NEGATIVE

## 2020-04-21 LAB — PROTIME-INR
INR: 1 (ref 0.8–1.2)
Prothrombin Time: 12.8 seconds (ref 11.4–15.2)

## 2020-04-21 LAB — GLUCOSE, CAPILLARY: Glucose-Capillary: 103 mg/dL — ABNORMAL HIGH (ref 70–99)

## 2020-04-21 NOTE — Progress Notes (Addendum)
Anesthesia Chart Review:  Case: [id number] Date/Time: 04/25/20 0715   Procedure: PLIF - L1-L2 - L2-L3 - L3-L4 - L4-L5 (N/A Back)   Anesthesia type: General   Pre-op diagnosis: Spondylolisthesis   Location: [facility] OR ROOM 23 / [facility]   Surgeons: Eustace Moore, MD      DISCUSSION: Patient is a 70 year old female scheduled for the above procedure.  History includes smoking, HTN, DM2, HLD, CAD, carotid artery stenosis (s/p right carotid endarterectomy 09/27/11), OSA (reportedly "fixed" after T&A), varicose veins (s/p vein stripping 2008), THR (right 08/22/18), hysterectomy (01/09/99).   Preoperative cardiology input outlined on 03/29/20 by Kerin Ransom, PA-C, "Given past medical history and time since last visit, based on ACC/AHA guidelines, April Shaffer would be at acceptable risk for the planned procedure without further cardiovascular testing.   Per previous recommendations-OK to hold Plavix 5-7 days pre op and aspirin 3-5 days pre op.  Resume as soon as safe post op, ideally within 24-48 hours." She reported last Plavix 04/17/20 and last ASA 04/19/20.  2/24/ preoperative COVID-19 test negative. Anesthesia team to evaluate on the day of surgery.    VS: BP (!) 171/78   Pulse 92   Temp 36.8 C   Resp 18   Ht _0  (1.6 m)   Wt 74.9 kg   SpO2 100%   BMI 29.25 kg/m   BP Readings from Last 3 Encounters:  04/21/20 (!) 171/78  12/08/19 (!) 150/72  10/09/19 (!) 144/80    PROVIDERS: Sonia Side., FNP is PCP  - Quay Burow, MD is cardiologist. Last visit 12/08/19. Servando Snare, MD is vascular surgeon. Last visit 10/27/19 with Risa Grill, PA-C. One year follow-up for carotid disease recommended. She forwarded information regarding thyroid enlargement to primary care.   LABS: Labs reviewed: Acceptable for surgery. (all labs ordered are listed, but only abnormal results are displayed)  Labs Reviewed  GLUCOSE, CAPILLARY - Abnormal; Notable for the following components:       Result Value   Glucose-Capillary 103 (*)    All other components within normal limits  BASIC METABOLIC PANEL - Abnormal; Notable for the following components:   Potassium 3.2 (*)    Glucose, Bld 102 (*)    All other components within normal limits  HEMOGLOBIN A1C - Abnormal; Notable for the following components:   Hgb A1c MFr Bld 6.6 (*)    All other components within normal limits  SURGICAL PCR SCREEN  CBC WITH DIFFERENTIAL/PLATELET  PROTIME-INR  TYPE AND SCREEN     IMAGES: CXR 04/21/20:  FINDINGS: The heart size and mediastinal contours are within normal limits. Both lungs are clear. The visualized skeletal structures are unremarkable. IMPRESSION: No active cardiopulmonary disease.  CT L-spine 03/22/20: IMPRESSION: 1. No acute osseous abnormality in the lumbar spine. 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 2. Superimposed chronic spondylolisthesis at L1-L2, L3-L4, and L4-L5 with associated severe disc, bulky endplate and facet degeneration. Stable subsequent multifactorial spinal, lateral recess and foraminal stenosis from the MRI last year at L1-L2 (moderate to severe), L2-L3 (moderate to severe), L3-L4 (severe), and L4-L5 (severe). 3. Aortic Atherosclerosis (ICD10-I70.0).  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 Recommend further evaluation with fine-needle aspiration biopsy. - Dr. Amil Amen notified patient of results. Patient wanted to defer FNA until after she recovered from right THR.    EKG: 04/21/20: Normal sinus rhythm Nonspecific T wave abnormality Prolonged QT [QT 426, QTc 472  ms] Abnormal ECG   CV: Carotid US 10/09/2019 Summary:  - Right Carotid: Evidence consistent with a total occlusion of the right ICA. Non-hemodynamically significant plaque <50% noted in the CCA.  - Left Carotid: Velocities in the left ICA are consistent with a 60-79% stenosis.  Non-hemodynamically significant plaque <50% noted in the CCA. The ECA appears >50% stenosed.  - Vertebrals: Bilateral vertebral arteries demonstrate antegrade flow.  - Subclavians: Normal flow hemodynamics were seen in bilateral subclavian arteries.  - Incidental findings: Enlarged left thyroid gland, measuring 5.1 x 2.8 x 3.3 cm. The right thyroid gland measures 3.9 x 1.0 x 1.5 cm.    Myocardial Perfusion 03/28/2018  Nuclear stress EF: 68%.  The left ventricular ejection fraction is hyperdynamic (>65%).  There was no ST segment deviation noted during stress.  The study is normal.  This is a low risk study. Normal pharmacologic nuclear study with no evidence for a prior infarct or ischemia. Since the prior study in 2013 ischemia is no longer present.   LHC 01/16/2012 LM: Normal in size. 10% distal stenosis LAD: Normal in size. 50% discrete ostial stenosis. In the proximal extending into the midsegment, there is a 50% tubular stenosis followed by a short aneurysmal segment. The rest of the vessel has minor irregularities. D1: 30% proximal stenosis. D2: 30% proximal stenosis. D3: Small with minor irregularities. LCX: Large in size and dominant. Minor irregularities. 30% proximal stenosis. OM1: Small in size with no significant disease. OM2: Small in size with no significant disease. OM3: Normal in size with diffuse 30% proximal disease. Posterior AV groove: Minor irregularities and gives the PDA and 2 PLA branches. RCA: Small and nondominant. Minor irregularities. Final Conclusions:   1. No evidence of obstructive coronary artery disease. There is moderate LAD disease which is diffuse as well as mild disease in the left circumflex. 2. Normal LV systolic function. 3. Moderate systemic hypertension with mildly elevated LVEDP and mild gradient across the aortic valve.   Past Medical History:  Diagnosis Date  . Arthritis   . Carotid artery narrowing   . Coronary artery disease     Cardiac catheterization November 2013: 50% ostial LAD stenosis 50% mid stenosis. 30% disease in the left circumflex.  . Diabetes mellitus, type 2 ([place])   . Hyperlipidemia   . Hypertension   . Onychomycosis of toenail 07/31/2016  . Sleep apnea       Had surgery to correct    Past Surgical History:  Procedure Laterality Date  . ABDOMINAL HYSTERECTOMY    . CARDIAC CATHETERIZATION    . CATARACT EXTRACTION, BILATERAL Bilateral 2021   Dr. Sherral Hammers  . ENDARTERECTOMY  09/27/2011   Procedure: RIGT ENDARTERECTOMY CAROTID;  Surgeon: Mal Misty, MD;  Location: [place];  Service: Vascular;  Laterality: Right;  . EPIDURAL BLOCK INJECTION  02/2008   Drs. Eulis Manly  . gyn surgery  2004   total hysterectomy for mennorhagia,,salpingoophorectomy  . TONSILLECTOMY    . TOTAL ABDOMINAL HYSTERECTOMY W/ BILATERAL SALPINGOOPHORECTOMY Bilateral 2000  . TOTAL HIP ARTHROPLASTY Right 08/22/2018   Procedure: TOTAL HIP ARTHROPLASTY;  Surgeon: Earlie Server, MD;  Location: [facility];  Service: Orthopedics;  Laterality: Right;  Marland Kitchen VARICOSE VEIN SURGERY  2008   stripping    MEDICATIONS: . acetaminophen (TYLENOL) 650 MG CR tablet  . allopurinol (ZYLOPRIM) 300 MG tablet  . amLODipine (NORVASC) 10 MG tablet  . aspirin EC 81 MG tablet  . atorvastatin (LIPITOR) 80 MG tablet  . blood glucose meter kit and  supplies KIT  . Calcium Carb-Cholecalciferol (CALCIUM 500 + D3 PO)  . carvedilol (COREG CR) 20 MG 24 hr capsule  . celecoxib (CELEBREX) 100 MG capsule  . clopidogrel (PLAVIX) 75 MG tablet  . ezetimibe (ZETIA) 10 MG tablet  . gabapentin (NEURONTIN) 300 MG capsule  . glucose blood test strip  . Liniments (SALONPAS PAIN RELIEF PATCH EX)  . metFORMIN (GLUCOPHAGE) 500 MG tablet  . MITIGARE 0.6 MG CAPS   No current facility-administered medications for this encounter.    April Gianotti, PA-C Surgical Short Stay/Anesthesiology [facility] Phone [phone] [facility] Phone [phone] 04/22/2020 4:22  PM

## 2020-04-21 NOTE — Anesthesia Preprocedure Evaluation (Addendum)
Anesthesia Evaluation  Patient identified by MRN, date of birth, ID band Patient awake    Reviewed: Allergy & Precautions, NPO status , Patient's Chart, lab work & pertinent test results  History of Anesthesia Complications Negative for: history of anesthetic complications  Airway Mallampati: II  TM Distance: >3 FB Neck ROM: Full    Dental  (+) Teeth Intact, Missing,    Pulmonary neg shortness of breath, neg sleep apnea, neg COPD, neg recent URI, Current Smoker and Patient abstained from smoking.,  Covid-19 Nucleic Acid Test Results Lab Results      Component                Value               Date                      SARSCOV2NAA              NEGATIVE            04/21/2020                SARSCOV2NAA              NEGATIVE            08/19/2018              breath sounds clear to auscultation       Cardiovascular hypertension, Pt. on medications and Pt. on home beta blockers (-) angina+ CAD   Rhythm:Regular   Nuclear stress EF: 68%.  The left ventricular ejection fraction is hyperdynamic (>65%).  There was no ST segment deviation noted during stress.  The study is normal.  This is a low risk study.   Normal pharmacologic nuclear study with no evidence for a prior infarct or ischemia. Since the prior study in 2013 ischemia is no longer present.    Neuro/Psych neg Seizures  Neuromuscular disease negative psych ROS   GI/Hepatic negative GI ROS, Neg liver ROS, Lab Results      Component                Value               Date                      ALT                      18                  08/04/2019                AST                      17                  08/04/2019                ALKPHOS                  109                 08/04/2019                BILITOT                  0.6  08/04/2019              Endo/Other  diabetes, Type 2, Oral Hypoglycemic Agents  Renal/GU Renal diseasenegative Renal  ROSLab Results      Component                Value               Date                      CREATININE               0.80                04/21/2020                Musculoskeletal  (+) Arthritis ,   Abdominal   Peds  Hematology negative hematology ROS (+) Lab Results      Component                Value               Date                      WBC                      7.6                 04/21/2020                HGB                      12.6                04/21/2020                HCT                      37.3                04/21/2020                MCV                      90.5                04/21/2020                PLT                      258                 04/21/2020              Anesthesia Other Findings   Reproductive/Obstetrics                            Anesthesia Physical Anesthesia Plan  ASA: II  Anesthesia Plan: General   Post-op Pain Management:    Induction: Intravenous  PONV Risk Score and Plan: 2 and Ondansetron and Dexamethasone  Airway Management Planned: Oral ETT  Additional Equipment: None  Intra-op Plan:   Post-operative Plan: Extubation in OR  Informed Consent: I have reviewed the patients History and Physical, chart, labs and discussed the procedure including the risks, benefits and alternatives for the proposed anesthesia with the patient or authorized representative  who has indicated his/her understanding and acceptance.     Dental advisory given  Plan Discussed with: CRNA  Anesthesia Plan Comments: (PAT note written by Shonna Chock, PA-C. )       Anesthesia Quick Evaluation

## 2020-04-21 NOTE — Progress Notes (Signed)
PCP - Fatima Sanger, FNP Cardiologist - Dr. Allyson Sabal Vascular - Dr. Randie Heinz  PPM/ICD - n/a Device Orders -  Rep Notified -   Chest x-ray - 04/21/20 EKG - 04/21/20 Stress Test - 02/2018 ECHO - patient denies Cardiac Cath - 2013, Dr. Kirke Corin  Sleep Study - patient states she has OSA at one time but had surgery to remove her tonsils and adenoids and that "fixed" her sleep apnea CPAP -   Fasting Blood Sugar - 100-110 Checks Blood Sugar "every once in a while"  Blood Thinner Instructions: last dose of plavix was 04/17/20 Aspirin Instructions: last dose of ASA was 04/19/20  ERAS Protcol - n/a PRE-SURGERY Ensure or G2-   COVID TEST- after PAT appointment 04/21/20   Anesthesia review: yes, hx of CAD, abnormal EKG  Patient denies shortness of breath, fever, cough and chest pain at PAT appointment   All instructions explained to the patient, with a verbal understanding of the material. Patient agrees to go over the instructions while at home for a better understanding. Patient also instructed to self quarantine after being tested for COVID-19. The opportunity to ask questions was provided.

## 2020-04-25 ENCOUNTER — Inpatient Hospital Stay (HOSPITAL_COMMUNITY): Payer: Medicare HMO | Admitting: Vascular Surgery

## 2020-04-25 ENCOUNTER — Other Ambulatory Visit: Payer: Self-pay

## 2020-04-25 ENCOUNTER — Inpatient Hospital Stay (HOSPITAL_COMMUNITY): Payer: Medicare HMO

## 2020-04-25 ENCOUNTER — Inpatient Hospital Stay (HOSPITAL_COMMUNITY): Payer: Medicare HMO | Admitting: Anesthesiology

## 2020-04-25 ENCOUNTER — Encounter (HOSPITAL_COMMUNITY)
Admission: RE | Disposition: A | Discharge status: Home / Self Care | Payer: Self-pay | Source: Home / Self Care | Attending: Neurological Surgery

## 2020-04-25 ENCOUNTER — Inpatient Hospital Stay (HOSPITAL_COMMUNITY)
Admission: RE | Admit: 2020-04-25 | Discharge: 2020-04-27 | DRG: 455 | Disposition: A | Discharge status: Home / Self Care | Payer: Medicare HMO | Attending: Neurological Surgery | Admitting: Neurological Surgery

## 2020-04-25 ENCOUNTER — Encounter (HOSPITAL_COMMUNITY): Payer: Self-pay | Admitting: Neurological Surgery

## 2020-04-25 DIAGNOSIS — Z981 Arthrodesis status: Secondary | ICD-10-CM

## 2020-04-25 DIAGNOSIS — Z9079 Acquired absence of other genital organ(s): Secondary | ICD-10-CM | POA: Diagnosis not present

## 2020-04-25 DIAGNOSIS — M1990 Unspecified osteoarthritis, unspecified site: Secondary | ICD-10-CM | POA: Diagnosis present

## 2020-04-25 DIAGNOSIS — Z7984 Long term (current) use of oral hypoglycemic drugs: Secondary | ICD-10-CM | POA: Diagnosis not present

## 2020-04-25 DIAGNOSIS — Z7902 Long term (current) use of antithrombotics/antiplatelets: Secondary | ICD-10-CM | POA: Diagnosis not present

## 2020-04-25 DIAGNOSIS — Z8249 Family history of ischemic heart disease and other diseases of the circulatory system: Secondary | ICD-10-CM | POA: Diagnosis not present

## 2020-04-25 DIAGNOSIS — Z90722 Acquired absence of ovaries, bilateral: Secondary | ICD-10-CM | POA: Diagnosis not present

## 2020-04-25 DIAGNOSIS — Z888 Allergy status to other drugs, medicaments and biological substances status: Secondary | ICD-10-CM | POA: Diagnosis not present

## 2020-04-25 DIAGNOSIS — Z79899 Other long term (current) drug therapy: Secondary | ICD-10-CM

## 2020-04-25 DIAGNOSIS — Z90710 Acquired absence of both cervix and uterus: Secondary | ICD-10-CM | POA: Diagnosis not present

## 2020-04-25 DIAGNOSIS — Z96641 Presence of right artificial hip joint: Secondary | ICD-10-CM | POA: Diagnosis present

## 2020-04-25 DIAGNOSIS — M48061 Spinal stenosis, lumbar region without neurogenic claudication: Secondary | ICD-10-CM | POA: Diagnosis present

## 2020-04-25 DIAGNOSIS — M5116 Intervertebral disc disorders with radiculopathy, lumbar region: Secondary | ICD-10-CM | POA: Diagnosis present

## 2020-04-25 DIAGNOSIS — I10 Essential (primary) hypertension: Secondary | ICD-10-CM | POA: Diagnosis present

## 2020-04-25 DIAGNOSIS — E119 Type 2 diabetes mellitus without complications: Secondary | ICD-10-CM | POA: Diagnosis present

## 2020-04-25 DIAGNOSIS — Z7982 Long term (current) use of aspirin: Secondary | ICD-10-CM | POA: Diagnosis not present

## 2020-04-25 DIAGNOSIS — E785 Hyperlipidemia, unspecified: Secondary | ICD-10-CM | POA: Diagnosis present

## 2020-04-25 DIAGNOSIS — M4036 Flatback syndrome, lumbar region: Principal | ICD-10-CM | POA: Diagnosis present

## 2020-04-25 DIAGNOSIS — Z791 Long term (current) use of non-steroidal anti-inflammatories (NSAID): Secondary | ICD-10-CM

## 2020-04-25 DIAGNOSIS — M4316 Spondylolisthesis, lumbar region: Secondary | ICD-10-CM | POA: Diagnosis present

## 2020-04-25 DIAGNOSIS — Z833 Family history of diabetes mellitus: Secondary | ICD-10-CM | POA: Diagnosis not present

## 2020-04-25 DIAGNOSIS — Z885 Allergy status to narcotic agent status: Secondary | ICD-10-CM

## 2020-04-25 DIAGNOSIS — I2510 Atherosclerotic heart disease of native coronary artery without angina pectoris: Secondary | ICD-10-CM | POA: Diagnosis present

## 2020-04-25 DIAGNOSIS — Z419 Encounter for procedure for purposes other than remedying health state, unspecified: Secondary | ICD-10-CM

## 2020-04-25 HISTORY — PX: POSTERIOR LUMBAR FUSION 4 LEVEL: SHX6037

## 2020-04-25 LAB — GLUCOSE, CAPILLARY
Glucose-Capillary: 109 mg/dL — ABNORMAL HIGH (ref 70–99)
Glucose-Capillary: 180 mg/dL — ABNORMAL HIGH (ref 70–99)
Glucose-Capillary: 307 mg/dL — ABNORMAL HIGH (ref 70–99)

## 2020-04-25 SURGERY — POSTERIOR LUMBAR FUSION 4 LEVEL
Anesthesia: General | Site: Spine Lumbar

## 2020-04-25 MED ORDER — SODIUM CHLORIDE (PF) 0.9 % IJ SOLN
INTRAMUSCULAR | Status: DC | PRN
  Administered 2020-04-25: 5 mL

## 2020-04-25 MED ORDER — INSULIN ASPART 100 UNIT/ML ~~LOC~~ SOLN: 0.0000 [IU] | Freq: Three times a day (TID) | SUBCUTANEOUS | Status: DC

## 2020-04-25 MED ORDER — BUPIVACAINE HCL (PF) 0.25 % IJ SOLN
INTRAMUSCULAR | Status: DC | PRN
  Administered 2020-04-25: 10 mL

## 2020-04-25 MED ORDER — ASPIRIN EC 81 MG PO TBEC
81.0000 mg | DELAYED_RELEASE_TABLET | Freq: Every day | ORAL | Status: DC
  Administered 2020-04-25 – 2020-04-27 (×3): 81 mg via ORAL
  Filled 2020-04-25 (×3): qty 1

## 2020-04-25 MED ORDER — DEXAMETHASONE SODIUM PHOSPHATE 10 MG/ML IJ SOLN: 10.0000 mg | Freq: Once | INTRAMUSCULAR | Status: DC

## 2020-04-25 MED ORDER — MIDAZOLAM HCL 5 MG/5ML IJ SOLN
INTRAMUSCULAR | Status: DC | PRN
  Administered 2020-04-25: 2 mg via INTRAVENOUS

## 2020-04-25 MED ORDER — HYDROMORPHONE HCL 2 MG PO TABS
2.0000 mg | ORAL_TABLET | Freq: Four times a day (QID) | ORAL | Status: DC | PRN
  Administered 2020-04-25: 2 mg via ORAL
  Filled 2020-04-25: qty 1

## 2020-04-25 MED ORDER — HEPARIN SODIUM (PORCINE) 1000 UNIT/ML IJ SOLN
INTRAMUSCULAR | Status: AC
  Filled 2020-04-25: qty 1

## 2020-04-25 MED ORDER — SODIUM CHLORIDE 0.9 % IV SOLN: 250.0000 mL | INTRAVENOUS | Status: DC

## 2020-04-25 MED ORDER — INSULIN ASPART 100 UNIT/ML ~~LOC~~ SOLN
0.0000 [IU] | Freq: Every day | SUBCUTANEOUS | Status: DC
  Administered 2020-04-25: 4 [IU] via SUBCUTANEOUS

## 2020-04-25 MED ORDER — AMLODIPINE BESYLATE 5 MG PO TABS
10.0000 mg | ORAL_TABLET | Freq: Every day | ORAL | Status: DC
  Administered 2020-04-26 – 2020-04-27 (×2): 10 mg via ORAL
  Filled 2020-04-25 (×2): qty 2

## 2020-04-25 MED ORDER — PHENOL 1.4 % MT LIQD: 1.0000 | OROMUCOSAL | Status: DC | PRN

## 2020-04-25 MED ORDER — MIDAZOLAM HCL 2 MG/2ML IJ SOLN
INTRAMUSCULAR | Status: AC
  Filled 2020-04-25: qty 2

## 2020-04-25 MED ORDER — CELECOXIB 200 MG PO CAPS
200.0000 mg | ORAL_CAPSULE | Freq: Two times a day (BID) | ORAL | Status: DC
  Administered 2020-04-25 – 2020-04-27 (×4): 200 mg via ORAL
  Filled 2020-04-25 (×4): qty 1

## 2020-04-25 MED ORDER — INSULIN ASPART 100 UNIT/ML ~~LOC~~ SOLN
0.0000 [IU] | Freq: Three times a day (TID) | SUBCUTANEOUS | Status: DC
  Administered 2020-04-26 (×2): 2 [IU] via SUBCUTANEOUS

## 2020-04-25 MED ORDER — GABAPENTIN 300 MG PO CAPS
300.0000 mg | ORAL_CAPSULE | Freq: Three times a day (TID) | ORAL | Status: DC
  Administered 2020-04-25 – 2020-04-27 (×5): 300 mg via ORAL
  Filled 2020-04-25 (×5): qty 1

## 2020-04-25 MED ORDER — SUGAMMADEX SODIUM 200 MG/2ML IV SOLN
INTRAVENOUS | Status: DC | PRN
  Administered 2020-04-25: 200 mg via INTRAVENOUS

## 2020-04-25 MED ORDER — CEFAZOLIN SODIUM-DEXTROSE 2-4 GM/100ML-% IV SOLN
2.0000 g | Freq: Three times a day (TID) | INTRAVENOUS | Status: AC
  Administered 2020-04-25 – 2020-04-26 (×2): 2 g via INTRAVENOUS
  Filled 2020-04-25 (×2): qty 100

## 2020-04-25 MED ORDER — ACETAMINOPHEN 10 MG/ML IV SOLN: 1000.0000 mg | Freq: Once | INTRAVENOUS | Status: DC | PRN

## 2020-04-25 MED ORDER — ACETAMINOPHEN 500 MG PO TABS: 1000.0000 mg | ORAL_TABLET | ORAL | Status: AC

## 2020-04-25 MED ORDER — ARTHREX ANGEL - ACD-A SOLUTION (CHARTING ONLY) OPTIME
TOPICAL | Status: DC | PRN
  Administered 2020-04-25: 10 mL via TOPICAL

## 2020-04-25 MED ORDER — ACETAMINOPHEN 160 MG/5ML PO SOLN: 1000.0000 mg | Freq: Once | ORAL | Status: DC | PRN

## 2020-04-25 MED ORDER — CHLORHEXIDINE GLUCONATE CLOTH 2 % EX PADS: 6.0000 | MEDICATED_PAD | Freq: Once | CUTANEOUS | Status: DC

## 2020-04-25 MED ORDER — METHOCARBAMOL 500 MG PO TABS
500.0000 mg | ORAL_TABLET | Freq: Four times a day (QID) | ORAL | Status: DC | PRN
  Administered 2020-04-25 – 2020-04-27 (×5): 500 mg via ORAL
  Filled 2020-04-25 (×5): qty 1

## 2020-04-25 MED ORDER — PHENYLEPHRINE 40 MCG/ML (10ML) SYRINGE FOR IV PUSH (FOR BLOOD PRESSURE SUPPORT)
PREFILLED_SYRINGE | INTRAVENOUS | Status: AC
  Filled 2020-04-25: qty 10

## 2020-04-25 MED ORDER — SODIUM CHLORIDE 0.9% FLUSH: 3.0000 mL | INTRAVENOUS | Status: DC | PRN

## 2020-04-25 MED ORDER — FENTANYL CITRATE (PF) 100 MCG/2ML IJ SOLN
INTRAMUSCULAR | Status: AC
  Filled 2020-04-25: qty 2

## 2020-04-25 MED ORDER — ROCURONIUM BROMIDE 10 MG/ML (PF) SYRINGE
PREFILLED_SYRINGE | INTRAVENOUS | Status: DC | PRN
  Administered 2020-04-25: 10 mg via INTRAVENOUS
  Administered 2020-04-25: 20 mg via INTRAVENOUS
  Administered 2020-04-25: 60 mg via INTRAVENOUS
  Administered 2020-04-25 (×2): 40 mg via INTRAVENOUS

## 2020-04-25 MED ORDER — THROMBIN 5000 UNITS EX SOLR
OROMUCOSAL | Status: DC | PRN
  Administered 2020-04-25: 5 mL via TOPICAL

## 2020-04-25 MED ORDER — METFORMIN HCL 500 MG PO TABS
1000.0000 mg | ORAL_TABLET | Freq: Every day | ORAL | Status: DC
  Administered 2020-04-25 – 2020-04-27 (×3): 1000 mg via ORAL
  Filled 2020-04-25 (×3): qty 2

## 2020-04-25 MED ORDER — ONDANSETRON HCL 4 MG/2ML IJ SOLN: 4.0000 mg | Freq: Four times a day (QID) | INTRAMUSCULAR | Status: DC | PRN

## 2020-04-25 MED ORDER — CEFAZOLIN SODIUM-DEXTROSE 2-4 GM/100ML-% IV SOLN
2.0000 g | INTRAVENOUS | Status: AC
  Administered 2020-04-25 (×2): 2 g via INTRAVENOUS

## 2020-04-25 MED ORDER — DEXMEDETOMIDINE (PRECEDEX) IN NS 20 MCG/5ML (4 MCG/ML) IV SYRINGE
PREFILLED_SYRINGE | INTRAVENOUS | Status: AC
  Filled 2020-04-25: qty 5

## 2020-04-25 MED ORDER — HYDROMORPHONE HCL 1 MG/ML IJ SOLN: 0.5000 mg | INTRAMUSCULAR | Status: DC | PRN

## 2020-04-25 MED ORDER — CALCIUM CARBONATE-VITAMIN D 500-200 MG-UNIT PO TABS
1.0000 | ORAL_TABLET | ORAL | Status: DC
  Administered 2020-04-25: 20:00:00 1 via ORAL
  Filled 2020-04-25: qty 1

## 2020-04-25 MED ORDER — POTASSIUM CHLORIDE IN NACL 20-0.9 MEQ/L-% IV SOLN: INTRAVENOUS | Status: DC

## 2020-04-25 MED ORDER — ONDANSETRON HCL 4 MG PO TABS: 4.0000 mg | ORAL_TABLET | Freq: Four times a day (QID) | ORAL | Status: DC | PRN

## 2020-04-25 MED ORDER — CHLORHEXIDINE GLUCONATE 0.12 % MT SOLN
OROMUCOSAL | Status: AC
  Filled 2020-04-25: qty 15

## 2020-04-25 MED ORDER — SENNA 8.6 MG PO TABS
1.0000 | ORAL_TABLET | Freq: Two times a day (BID) | ORAL | Status: DC
  Administered 2020-04-25 – 2020-04-27 (×4): 8.6 mg via ORAL
  Filled 2020-04-25 (×4): qty 1

## 2020-04-25 MED ORDER — PROPOFOL 10 MG/ML IV BOLUS
INTRAVENOUS | Status: DC | PRN
  Administered 2020-04-25: 170 mg via INTRAVENOUS

## 2020-04-25 MED ORDER — CEFAZOLIN SODIUM-DEXTROSE 2-4 GM/100ML-% IV SOLN
INTRAVENOUS | Status: AC
  Filled 2020-04-25: qty 100

## 2020-04-25 MED ORDER — PHENYLEPHRINE HCL (PRESSORS) 10 MG/ML IV SOLN
INTRAVENOUS | Status: AC
  Filled 2020-04-25: qty 3

## 2020-04-25 MED ORDER — ACETAMINOPHEN 500 MG PO TABS: 1000.0000 mg | ORAL_TABLET | Freq: Once | ORAL | Status: DC | PRN

## 2020-04-25 MED ORDER — LACTATED RINGERS IV SOLN: INTRAVENOUS | Status: DC

## 2020-04-25 MED ORDER — 0.9 % SODIUM CHLORIDE (POUR BTL) OPTIME
TOPICAL | Status: DC | PRN
  Administered 2020-04-25 (×2): 1000 mL

## 2020-04-25 MED ORDER — ACETAMINOPHEN 650 MG RE SUPP: 650.0000 mg | RECTAL | Status: DC | PRN

## 2020-04-25 MED ORDER — ACETAMINOPHEN 325 MG PO TABS: 650.0000 mg | ORAL_TABLET | ORAL | Status: DC | PRN

## 2020-04-25 MED ORDER — FENTANYL CITRATE (PF) 250 MCG/5ML IJ SOLN
INTRAMUSCULAR | Status: AC
  Filled 2020-04-25: qty 5

## 2020-04-25 MED ORDER — EZETIMIBE 10 MG PO TABS
10.0000 mg | ORAL_TABLET | Freq: Every day | ORAL | Status: DC
  Administered 2020-04-26 – 2020-04-27 (×2): 10 mg via ORAL
  Filled 2020-04-25 (×2): qty 1

## 2020-04-25 MED ORDER — THROMBIN 20000 UNITS EX SOLR
CUTANEOUS | Status: DC | PRN
  Administered 2020-04-25: 20 mL via TOPICAL

## 2020-04-25 MED ORDER — ALBUMIN HUMAN 5 % IV SOLN: INTRAVENOUS | Status: DC | PRN

## 2020-04-25 MED ORDER — ACETAMINOPHEN 10 MG/ML IV SOLN
INTRAVENOUS | Status: AC
  Filled 2020-04-25: qty 100

## 2020-04-25 MED ORDER — LIDOCAINE 2% (20 MG/ML) 5 ML SYRINGE
INTRAMUSCULAR | Status: DC | PRN
  Administered 2020-04-25: 60 mg via INTRAVENOUS

## 2020-04-25 MED ORDER — ORAL CARE MOUTH RINSE: 15.0000 mL | Freq: Once | OROMUCOSAL | Status: DC

## 2020-04-25 MED ORDER — DEXAMETHASONE SODIUM PHOSPHATE 10 MG/ML IJ SOLN
INTRAMUSCULAR | Status: AC
  Filled 2020-04-25: qty 1

## 2020-04-25 MED ORDER — SODIUM CHLORIDE 0.9% FLUSH
3.0000 mL | Freq: Two times a day (BID) | INTRAVENOUS | Status: DC
  Administered 2020-04-26 (×2): 3 mL via INTRAVENOUS

## 2020-04-25 MED ORDER — PROPOFOL 10 MG/ML IV BOLUS
INTRAVENOUS | Status: AC
  Filled 2020-04-25: qty 40

## 2020-04-25 MED ORDER — FENTANYL CITRATE (PF) 100 MCG/2ML IJ SOLN
INTRAMUSCULAR | Status: DC | PRN
  Administered 2020-04-25 (×5): 50 ug via INTRAVENOUS

## 2020-04-25 MED ORDER — PHENYLEPHRINE HCL-NACL 10-0.9 MG/250ML-% IV SOLN
INTRAVENOUS | Status: DC | PRN
  Administered 2020-04-25: 50 ug/min via INTRAVENOUS

## 2020-04-25 MED ORDER — FENTANYL CITRATE (PF) 100 MCG/2ML IJ SOLN
25.0000 ug | INTRAMUSCULAR | Status: DC | PRN
  Administered 2020-04-25 (×4): 25 ug via INTRAVENOUS

## 2020-04-25 MED ORDER — ALLOPURINOL 300 MG PO TABS
300.0000 mg | ORAL_TABLET | Freq: Every day | ORAL | Status: DC
  Administered 2020-04-26 – 2020-04-27 (×2): 300 mg via ORAL
  Filled 2020-04-25 (×2): qty 1

## 2020-04-25 MED ORDER — ACETAMINOPHEN 500 MG PO TABS
ORAL_TABLET | ORAL | Status: AC
  Administered 2020-04-25: 1000 mg via ORAL
  Filled 2020-04-25: qty 2

## 2020-04-25 MED ORDER — ATORVASTATIN CALCIUM 80 MG PO TABS
80.0000 mg | ORAL_TABLET | Freq: Every day | ORAL | Status: DC
  Administered 2020-04-25 – 2020-04-27 (×3): 80 mg via ORAL
  Filled 2020-04-25 (×3): qty 1

## 2020-04-25 MED ORDER — CARVEDILOL PHOSPHATE ER 20 MG PO CP24
20.0000 mg | ORAL_CAPSULE | Freq: Every day | ORAL | Status: DC
  Administered 2020-04-26 – 2020-04-27 (×2): 20 mg via ORAL
  Filled 2020-04-25 (×2): qty 1

## 2020-04-25 MED ORDER — DEXMEDETOMIDINE (PRECEDEX) IN NS 20 MCG/5ML (4 MCG/ML) IV SYRINGE
PREFILLED_SYRINGE | INTRAVENOUS | Status: DC | PRN
  Administered 2020-04-25: 8 ug via INTRAVENOUS
  Administered 2020-04-25: 12 ug via INTRAVENOUS

## 2020-04-25 MED ORDER — DEXAMETHASONE SODIUM PHOSPHATE 4 MG/ML IJ SOLN
INTRAMUSCULAR | Status: DC | PRN
  Administered 2020-04-25: 6 mg via INTRAVENOUS

## 2020-04-25 MED ORDER — ACETAMINOPHEN 10 MG/ML IV SOLN
INTRAVENOUS | Status: DC | PRN
  Administered 2020-04-25: 1000 mg via INTRAVENOUS

## 2020-04-25 MED ORDER — LIDOCAINE 2% (20 MG/ML) 5 ML SYRINGE
INTRAMUSCULAR | Status: AC
  Filled 2020-04-25: qty 5

## 2020-04-25 MED ORDER — ROCURONIUM BROMIDE 10 MG/ML (PF) SYRINGE
PREFILLED_SYRINGE | INTRAVENOUS | Status: AC
  Filled 2020-04-25: qty 10

## 2020-04-25 MED ORDER — ONDANSETRON HCL 4 MG/2ML IJ SOLN
INTRAMUSCULAR | Status: DC | PRN
  Administered 2020-04-25 (×2): 4 mg via INTRAVENOUS

## 2020-04-25 MED ORDER — THROMBIN 5000 UNITS EX SOLR
CUTANEOUS | Status: AC
  Filled 2020-04-25: qty 5000

## 2020-04-25 MED ORDER — METHOCARBAMOL 1000 MG/10ML IJ SOLN
500.0000 mg | Freq: Four times a day (QID) | INTRAVENOUS | Status: DC | PRN
  Filled 2020-04-25: qty 5

## 2020-04-25 MED ORDER — BUPIVACAINE HCL (PF) 0.25 % IJ SOLN
INTRAMUSCULAR | Status: AC
  Filled 2020-04-25: qty 30

## 2020-04-25 MED ORDER — MENTHOL 3 MG MT LOZG: 1.0000 | LOZENGE | OROMUCOSAL | Status: DC | PRN

## 2020-04-25 MED ORDER — ONDANSETRON HCL 4 MG/2ML IJ SOLN
INTRAMUSCULAR | Status: AC
  Filled 2020-04-25: qty 2

## 2020-04-25 MED ORDER — HEPARIN SODIUM (PORCINE) 1000 UNIT/ML IJ SOLN
INTRAMUSCULAR | Status: DC | PRN
  Administered 2020-04-25: 5000 [IU] via INTRAVENOUS

## 2020-04-25 MED ORDER — GABAPENTIN 300 MG PO CAPS: 300.0000 mg | ORAL_CAPSULE | ORAL | Status: DC

## 2020-04-25 MED ORDER — CHLORHEXIDINE GLUCONATE 0.12 % MT SOLN: 15.0000 mL | Freq: Once | OROMUCOSAL | Status: DC

## 2020-04-25 MED ORDER — THROMBIN 20000 UNITS EX SOLR
CUTANEOUS | Status: AC
  Filled 2020-04-25: qty 20000

## 2020-04-25 SURGICAL SUPPLY — 78 items
ADH SKN CLS APL DERMABOND .7 (GAUZE/BANDAGES/DRESSINGS) ×1
APL SKNCLS STERI-STRIP NONHPOA (GAUZE/BANDAGES/DRESSINGS) ×1
BASKET BONE COLLECTION (BASKET) ×2 IMPLANT
BENZOIN TINCTURE PRP APPL 2/3 (GAUZE/BANDAGES/DRESSINGS) ×2 IMPLANT
BLADE CLIPPER SURG (BLADE) IMPLANT
BONE FIBERS PLIAFX 10 (Bone Implant) ×2 IMPLANT
BUR CARBIDE MATCH 3.0 (BURR) ×2 IMPLANT
CAGE LORD EXP 8X10X25 20D (Cage) ×2 IMPLANT
CAGE LORD PSX 10X10X25 20D (Cage) ×2 IMPLANT
CAGE LORD PSX 12X10X25 20D (Cage) ×2 IMPLANT
CANISTER SUCT 3000ML PPV (MISCELLANEOUS) ×2 IMPLANT
CATH FOLEY LATEX FREE 16FR (CATHETERS) ×2
CATH FOLEY LF 16FR (CATHETERS) IMPLANT
CNTNR URN SCR LID CUP LEK RST (MISCELLANEOUS) ×1 IMPLANT
CONT SPEC 4OZ STRL OR WHT (MISCELLANEOUS) ×2
COVER BACK TABLE 60X90IN (DRAPES) ×2 IMPLANT
COVER WAND RF STERILE (DRAPES) ×1 IMPLANT
DERMABOND ADVANCED (GAUZE/BANDAGES/DRESSINGS) ×1
DERMABOND ADVANCED .7 DNX12 (GAUZE/BANDAGES/DRESSINGS) ×1 IMPLANT
DIFFUSER DRILL AIR PNEUMATIC (MISCELLANEOUS) ×1 IMPLANT
DRAPE C-ARM 42X72 X-RAY (DRAPES) ×3 IMPLANT
DRAPE C-ARMOR (DRAPES) ×1 IMPLANT
DRAPE LAPAROTOMY 100X72X124 (DRAPES) ×2 IMPLANT
DRAPE SURG 17X23 STRL (DRAPES) ×2 IMPLANT
DRSG OPSITE 4X5.5 SM (GAUZE/BANDAGES/DRESSINGS) ×1 IMPLANT
DRSG OPSITE POSTOP 4X8 (GAUZE/BANDAGES/DRESSINGS) ×1 IMPLANT
DURAPREP 26ML APPLICATOR (WOUND CARE) ×2 IMPLANT
ELECT REM PT RETURN 9FT ADLT (ELECTROSURGICAL) ×2
ELECTRODE REM PT RTRN 9FT ADLT (ELECTROSURGICAL) ×1 IMPLANT
EVACUATOR 1/8 PVC DRAIN (DRAIN) ×2 IMPLANT
GAUZE 4X4 16PLY RFD (DISPOSABLE) ×1 IMPLANT
GLOVE BIO SURGEON STRL SZ7 (GLOVE) ×1 IMPLANT
GLOVE SRG 8 PF TXTR STRL LF DI (GLOVE) IMPLANT
GLOVE SURG ENC MOIS LTX SZ7 (GLOVE) ×1 IMPLANT
GLOVE SURG ENC MOIS LTX SZ8 (GLOVE) ×2 IMPLANT
GLOVE SURG POLYISO LF SZ7 (GLOVE) ×2 IMPLANT
GLOVE SURG UNDER POLY LF SZ7 (GLOVE) ×4 IMPLANT
GLOVE SURG UNDER POLY LF SZ7.5 (GLOVE) ×1 IMPLANT
GLOVE SURG UNDER POLY LF SZ8 (GLOVE) ×10
GOWN STRL REUS W/ TWL LRG LVL3 (GOWN DISPOSABLE) IMPLANT
GOWN STRL REUS W/ TWL XL LVL3 (GOWN DISPOSABLE) ×2 IMPLANT
GOWN STRL REUS W/TWL 2XL LVL3 (GOWN DISPOSABLE) ×3 IMPLANT
GOWN STRL REUS W/TWL LRG LVL3 (GOWN DISPOSABLE) ×4
GOWN STRL REUS W/TWL XL LVL3 (GOWN DISPOSABLE) ×4
GRAFT BNE FBR PLIAFX PRIME 10 (Bone Implant) IMPLANT
HEMOSTAT POWDER KIT SURGIFOAM (HEMOSTASIS) ×1 IMPLANT
KIT BASIN OR (CUSTOM PROCEDURE TRAY) ×2 IMPLANT
KIT BONE MRW ASP ANGEL CPRP (KITS) ×1 IMPLANT
KIT TURNOVER KIT B (KITS) ×2 IMPLANT
MILL MEDIUM DISP (BLADE) ×1 IMPLANT
NDL BLUNT 18X1 FOR OR ONLY (NEEDLE) IMPLANT
NDL HYPO 25X1 1.5 SAFETY (NEEDLE) ×1 IMPLANT
NEEDLE BLUNT 18X1 FOR OR ONLY (NEEDLE) ×4 IMPLANT
NEEDLE HYPO 25X1 1.5 SAFETY (NEEDLE) ×2 IMPLANT
NS IRRIG 1000ML POUR BTL (IV SOLUTION) ×3 IMPLANT
PACK LAMINECTOMY NEURO (CUSTOM PROCEDURE TRAY) ×2 IMPLANT
PAD ARMBOARD 7.5X6 YLW CONV (MISCELLANEOUS) ×11 IMPLANT
ROD LORD LIP 5.5X130 (Rod) ×2 IMPLANT
SCREW CANC SHANK MOD 6.5X30 (Screw) ×1 IMPLANT
SCREW CANC SHANK MOD 6.5X35 (Screw) ×1 IMPLANT
SCREW CORT SHANK MOD 6.5X40 (Screw) ×6 IMPLANT
SCREW POLYAXIAL TULIP (Screw) ×10 IMPLANT
SCREW SHANK MOD 5.5X40 (Screw) ×2 IMPLANT
SET SCREW (Screw) ×20 IMPLANT
SET SCREW SPNE (Screw) IMPLANT
SPACER PEEK PS 25X7MM 9MM 5DEG (Spacer) ×2 IMPLANT
SPONGE LAP 4X18 RFD (DISPOSABLE) IMPLANT
SPONGE SURGIFOAM ABS GEL 100 (HEMOSTASIS) ×2 IMPLANT
STRIP CLOSURE SKIN 1/2X4 (GAUZE/BANDAGES/DRESSINGS) ×4 IMPLANT
SUT VIC AB 0 CT1 18XCR BRD8 (SUTURE) ×1 IMPLANT
SUT VIC AB 0 CT1 8-18 (SUTURE) ×4
SUT VIC AB 2-0 CP2 18 (SUTURE) ×3 IMPLANT
SUT VIC AB 3-0 SH 8-18 (SUTURE) ×4 IMPLANT
SYR CONTROL 10ML LL (SYRINGE) ×3 IMPLANT
TOWEL GREEN STERILE (TOWEL DISPOSABLE) ×2 IMPLANT
TOWEL GREEN STERILE FF (TOWEL DISPOSABLE) ×2 IMPLANT
TRAY FOLEY MTR SLVR 16FR STAT (SET/KITS/TRAYS/PACK) ×1 IMPLANT
WATER STERILE IRR 1000ML POUR (IV SOLUTION) ×2 IMPLANT

## 2020-04-25 NOTE — Op Note (Signed)
04/25/2020  3:20 PM  PATIENT:  April Shaffer  70 y.o. female  PRE-OPERATIVE DIAGNOSIS:  1.  Spondylolisthesis L2-3 L3-4 L4-5, 2.  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  Severe spinal stenosis L1-L2 , L2 3, L3-4, L4-5, 4.  Severe degenerative disc disease with loss of disc base height Modic endplate changes L1-2, 5.  Back pain with radiculopathy  POST-OPERATIVE DIAGNOSIS:  same  PROCEDURE:   1. Decompressive lumbar laminectomy hemifacetectomy and foraminotomies with posterior osteotomies L1-2, L2-3, L3-4, L4-5 requiring more work than would be required for a simple exposure of the disk for PLIF in order to adequately decompress the neural elements and address the spinal stenosis and correct sagittal balance 2. Posterior lumbar interbody fusion L1-2, L2-3, L3-4, L4-5 using a Tec interbody cages packed with morcellized allograft and autograft soaked with a bone marrow aspirate obtained through a separate fascial incision over the right iliac crest 3. Posterior fixation L1-5 inclusive using a Tec cortical pedicle screws.  4. Intertransverse arthrodesis L1-5 inclusive using morcellized autograft and allograft.  SURGEON:  Marikay Alar, MD  ASSISTANTS: Verlin Dike, FNP  ANESTHESIA:  General  EBL: 600 ml  Total I/O In: 2850 [I.V.:2400; Blood:200; IV Piggyback:250] Out: 915 [Urine:315; Blood:600]  BLOOD ADMINISTERED:250 CC CELLSAVER  DRAINS: none   INDICATION FOR PROCEDURE: This patient presented with back and leg pain. Imaging revealed loss of balance in the sagittal and coronal plane with spondylolisthesis at L2-3 L3-4 and L4-5 with degenerative disc disease L1-2 with severe spinal stenosis at all levels. The patient tried a reasonable attempt at conservative medical measures without relief. I recommended decompression and instrumented fusion to address the stenosis as well as the segmental  instability.  Patient understood the risks, benefits, and  alternatives and potential outcomes and wished to proceed.  PROCEDURE DETAILS:  The patient was brought to the operating room. After induction of generalized endotracheal anesthesia the patient was rolled into the prone position on chest rolls and all pressure points were padded. The patient's lumbar region was cleaned and then prepped with DuraPrep and draped in the usual sterile fashion. Anesthesia was injected and then a dorsal midline incision was made and carried down to the lumbosacral fascia. The fascia was opened and the paraspinous musculature was taken down in a subperiosteal fashion to expose L1-L5. A self-retaining retractor was placed. Intraoperative fluoroscopy confirmed my level, and I started with placement of the L1 cortical pedicle screws. The pedicle screw entry zones were identified utilizing surface landmarks and  AP and lateral fluoroscopy. I scored the cortex with the high-speed drill and then used the hand drill to drill an upward and outward direction into the pedicle. I then tapped line to line. I then placed a 5.5 x 40 mm cortical pedicle screw into the pedicles of L1 bilaterally.  I then dissected in a suprafascial plane to expose the iliac crest.  Opened the fascia and we used a Jamshidi needle to extract 60 cc of bone marrow aspirate from the iliac crest with the assistance of my nurse practitioner.  This was then spun down by [facility] device and 2 to 4 cc of  BMAC was soaked on morselized allograft for later arthrodesis.  I dried the hole with Surgifoam and closed the fascia.  I then turned my attention to the decompression and complete lumbar laminectomies, hemi- facetectomies, and foraminotomies were performed at L1-2, L2-3, L3-4, L4-5.  My nurse practitioner was directly involved in the decompression and exposure of  the neural elements. the patient had significant spinal stenosis and this required more work than would be required for a simple exposure of the disc for posterior  lumbar interbody fusion which would only require a limited laminotomy.  Because of her loss of sagittal and coronal balance we performed posterior osteotomies removing posterior elements and cutting across the pars at each level and performing facetectomies.  Much more generous decompression and generous foraminotomy was undertaken in order to adequately decompress the neural elements and address the patient's leg pain. The yellow ligament was removed to expose the underlying dura and nerve roots, and generous foraminotomies were performed to adequately decompress the neural elements. Both the exiting and traversing nerve roots were decompressed on both sides until a coronary dilator passed easily along the nerve roots. Once the decompression was complete, I turned my attention to the posterior lower lumbar interbody fusion. The epidural venous vasculature was coagulated and cut sharply. Disc space was incised and the initial discectomy was performed with pituitary rongeurs. The disc space was distracted with sequential distractors to a height of 7 mm at L1-2, and 10 mm at L 2-3 and L3-4 and 12 mm at L4-5. We then used a series of scrapers and shavers to prepare the endplates for fusion. The midline was prepared with Epstein curettes. Once the complete discectomy was finished, we packed an appropriate sized interbody cage with local autograft and morcellized allograft, gently retracted the nerve root, and tapped the cage into position at L1-2, L2-3, L3-4, and L4-5.  Expandable PSX cages were used at L2-3, L3-4 and L4-5, and peek cages were used at L1-2.  the midline between the cages was packed with morselized autograft and allograft. We then turned our attention to the placement of the lower pedicle screws. The pedicle screw entry zones were identified utilizing surface landmarks and fluoroscopy. I drilled into each pedicle utilizing the hand drill, and tapped each pedicle with the appropriate tap. We palpated with  a ball probe to assure no break in the cortex. We then placed 6.5 x 40 mm cortical pedicle screws into the pedicles bilaterally at L2-L5 bilaterally inclusive.  My nurse practitioner assisted in placement of the pedicle screws.  We then decorticated the transverse processes at L1, L2, L3, L4, and L5 bilaterally by drilling the spinous processes and laid a mixture of morcellized autograft and allograft out over these to perform intertransverse arthrodesis at L1-5 inclusive. We then placed lordotic rods into the multiaxial screw heads of the pedicle screws and locked these in position with the locking caps and anti-torque device.  We achieved compression of her grafts at L1-2.  Through all of this we achieved about 15 degrees of lordosis correction.  We then checked our construct with AP and lateral fluoroscopy. Irrigated with copious amounts of bacitracin-containing saline solution. Inspected the nerve roots once again to assure adequate decompression, lined to the dura with Gelfoam, laced a medium Hemovac drain through a separate stab incision on both sides, and then we closed the muscle and the fascia with 0 Vicryl. Closed the subcutaneous tissues with 2-0 Vicryl and subcuticular tissues with 3-0 Vicryl. The skin was closed with benzoin and Steri-Strips. Dressing was then applied, the patient was awakened from general anesthesia and transported to the recovery room in stable condition. At the end of the procedure all sponge, needle and instrument counts were correct.   PLAN OF CARE: admit to inpatient  PATIENT DISPOSITION:  PACU - hemodynamically stable.   Delay start of  Pharmacological VTE agent (>24hrs) due to surgical blood loss or risk of bleeding:  yes

## 2020-04-25 NOTE — H&P (Signed)
Subjective: Patient is a 70 y.o. female admitted for back and leg pain. Onset of symptoms was several months ago, gradually worsening since that time.  The pain is rated severe, unremitting, and is located at the across the lower back and radiates to R hip and leg. The pain is described as aching and occurs all day. The symptoms have been progressive. Symptoms are exacerbated by exercise and standing. MRI or CT showed spondylolisthesis with stenosis/ DDD   Past Medical History:  Diagnosis Date  . Arthritis   . Carotid artery narrowing   . Coronary artery disease    Cardiac catheterization November 2013: 50% ostial LAD stenosis 50% mid stenosis. 30% disease in the left circumflex.  . Diabetes mellitus, type 2 ([place])   . Hyperlipidemia   . Hypertension   . Onychomycosis of toenail 07/31/2016  . Sleep apnea       Had surgery to correct    Past Surgical History:  Procedure Laterality Date  . ABDOMINAL HYSTERECTOMY    . CARDIAC CATHETERIZATION    . CATARACT EXTRACTION, BILATERAL Bilateral 2021   Dr. Sherral Hammers  . ENDARTERECTOMY  09/27/2011   Procedure: RIGT ENDARTERECTOMY CAROTID;  Surgeon: Mal Misty, MD;  Location: [place];  Service: Vascular;  Laterality: Right;  . EPIDURAL BLOCK INJECTION  02/2008   Drs. Eulis Manly  . gyn surgery  2004   total hysterectomy for mennorhagia,,salpingoophorectomy  . TONSILLECTOMY    . TOTAL ABDOMINAL HYSTERECTOMY W/ BILATERAL SALPINGOOPHORECTOMY Bilateral 2000  . TOTAL HIP ARTHROPLASTY Right 08/22/2018   Procedure: TOTAL HIP ARTHROPLASTY;  Surgeon: Earlie Server, MD;  Location: [facility];  Service: Orthopedics;  Laterality: Right;  Marland Kitchen VARICOSE VEIN SURGERY  2008   stripping    Prior to Admission medications   Medication Sig Start Date End Date Taking? Authorizing Provider  acetaminophen (TYLENOL) 650 MG CR tablet Take 1,300 mg by mouth daily as needed for pain.   Yes [provider]  allopurinol (ZYLOPRIM) 300 MG tablet Take 1 tablet (300 mg  total) by mouth daily. 07/09/19  Yes Mack Hook, MD  amLODipine (NORVASC) 10 MG tablet Take 1 tablet (10 mg total) by mouth daily. 07/09/19  Yes Mack Hook, MD  aspirin EC 81 MG tablet Take 81 mg by mouth daily.   Yes [provider]  atorvastatin (LIPITOR) 80 MG tablet TAKE 1 TABLET BY MOUTH ONCE DAILY WITH  EVENING  MEAL Patient taking differently: Take 80 mg by mouth daily. 07/09/19  Yes Mack Hook, MD  Calcium Carb-Cholecalciferol (CALCIUM 500 + D3 PO) Take 1 tablet by mouth once a week.   Yes [provider]  carvedilol (COREG CR) 20 MG 24 hr capsule Take 1 capsule (20 mg total) by mouth daily. 07/09/19  Yes Mack Hook, MD  celecoxib (CELEBREX) 100 MG capsule Take 100 mg by mouth daily as needed (pain.). 10/29/19  Yes [provider]  clopidogrel (PLAVIX) 75 MG tablet Take 1 tablet by mouth once daily with breakfast Patient taking differently: Take 75 mg by mouth daily with breakfast. Take 1 tablet by mouth once daily with breakfast 06/09/19  Yes Mack Hook, MD  ezetimibe (ZETIA) 10 MG tablet TAKE 1 TABLET EVERY DAY (NEED MD APPOINTMENT) Patient taking differently: Take 10 mg by mouth daily. 12/11/19  Yes Lorretta Harp, MD  gabapentin (NEURONTIN) 300 MG capsule 1 cap by mouth in morning, 1 cap by mouth midday and 2 caps by mouth before bedtime. Patient taking differently: Take 300-600 mg by mouth See  admin instructions. Take 1 capsule (300 mg) by mouth in the morning & take 2 capsules (600 mg) by mouth at bedtime 06/10/19  Yes Mack Hook, MD  [facility] PAIN RELIEF PATCH EX) Place 1 patch onto the skin daily as needed (pain).   Yes [provider]  metFORMIN (GLUCOPHAGE) 500 MG tablet Take 1,000 mg by mouth daily. 04/08/20  Yes [provider]  MITIGARE 0.6 MG CAPS TAKE 1 CAPSULE TWICE DAILY AS NEEDED FOR GOUT FLARE AND HOLD ATORVASTATIN ON DAYS TAKING [place] Patient taking differently: Take  0.6 mg by mouth 2 (two) times daily as needed (gout flares). 12/31/19  Yes Mack Hook, MD  blood glucose meter kit and supplies KIT Check sugars twice daily 06/10/18   Mack Hook, MD  glucose blood test strip Check sugars twice daily 01/31/16   Mack Hook, MD   Allergies  Allergen Reactions  . Lisinopril-Hydrochlorothiazide Other (See Comments)    Angioedema  - Tongue swelling   . Codeine Itching  . Oxycodone Itching  . Simvastatin Other (See Comments)    Muscle pain  . Tramadol Itching    Social History   Tobacco Use  . Smoking status: Current Every Day Smoker    Packs/day: 0.10    Years: 25.00    Pack years: 2.50    Types: Cigarettes    Start date: 05/31/1968  . Smokeless tobacco: Never Used  . Tobacco comment: pt states she smokes about 4-5 cigs per day--states she contiues to work on it.  No interest in other help now.  Substance Use Topics  . Alcohol use: Yes    Comment: rare    Family History  Problem Relation Age of Onset  . Hypertension Sister   . Hypothyroidism Sister   . Cancer Maternal Grandmother        lung  . Hypertension Son   . Hypertension Sister   . Diabetes Brother        lost toe in 2018 with new diagnosis of DM  . Hypertension Son      Review of Systems  Positive ROS: neg  All other systems have been reviewed and were otherwise negative with the exception of those mentioned in the HPI and as above.  Objective: Vital signs in last 24 hours: Temp:  [97.7 F (36.5 C)] 97.7 F (36.5 C) (02/28 0634) Pulse Rate:  [68] 68 (02/28 0622) Resp:  [18] 18 (02/28 0622) BP: (157)/(54) 157/54 (02/28 0622) SpO2:  [100 %] 100 % (02/28 0622) Weight:  [74.8 kg] 74.8 kg (02/28 0622)  General Appearance: Alert, cooperative, no distress, appears stated age Head: Normocephalic, without obvious abnormality, atraumatic Eyes: PERRL, conjunctiva/corneas clear, EOM's intact    Neck: Supple, symmetrical, trachea midline Back: Symmetric, no  curvature, ROM normal, no CVA tenderness Lungs:  respirations unlabored Heart: Regular rate and rhythm Abdomen: Soft, non-tender Extremities: Extremities normal, atraumatic, no cyanosis or edema Pulses: 2+ and symmetric all extremities Skin: Skin color, texture, turgor normal, no rashes or lesions  NEUROLOGIC:   Mental status: Alert and oriented x4,  no aphasia, good attention span, fund of knowledge, and memory Motor Exam - grossly normal Sensory Exam - grossly normal Reflexes: trace Coordination - grossly normal Gait - grossly normal Balance - grossly normal Cranial Nerves: I: smell Not tested  II: visual acuity  OS: nl    OD: nl  II: visual fields Full to confrontation  II: pupils Equal, round, reactive to light  III,VII: ptosis None  III,IV,VI: extraocular muscles  Full ROM  V: mastication Normal  V: facial light touch sensation  Normal  V,VII: corneal reflex  Present  VII: facial muscle function - upper  Normal  VII: facial muscle function - lower Normal  VIII: hearing Not tested  IX: soft palate elevation  Normal  IX,X: gag reflex Present  XI: trapezius strength  5/5  XI: sternocleidomastoid strength 5/5  XI: neck flexion strength  5/5  XII: tongue strength  Normal    Data Review Lab Results  Component Value Date   WBC 7.6 04/21/2020   HGB 12.6 04/21/2020   HCT 37.3 04/21/2020   MCV 90.5 04/21/2020   PLT 258 04/21/2020   Lab Results  Component Value Date   NA 137 04/21/2020   K 3.2 (L) 04/21/2020   CL 102 04/21/2020   CO2 25 04/21/2020   BUN 8 04/21/2020   CREATININE 0.80 04/21/2020   GLUCOSE 102 (H) 04/21/2020   Lab Results  Component Value Date   INR 1.0 04/21/2020    Assessment/Plan:  Estimated body mass index is 29.23 kg/m as calculated from the following:   Height as of this encounter: _0  (1.6 m).   Weight as of this encounter: 74.8 kg. Patient admitted for PLIF L1-2 L2-3 L3-4 L4-5. Patient has failed a reasonable attempt at  conservative therapy.  I explained the condition and procedure to the patient and answered any questions.  Patient wishes to proceed with procedure as planned. Understands risks/ benefits and typical outcomes of procedure.   Eustace Moore 04/25/2020 7:28 AM

## 2020-04-25 NOTE — Transfer of Care (Signed)
Immediate Anesthesia Transfer of Care Note  Patient: April Shaffer  Procedure(s) Performed: POSTERIOR LUMBAR INTERBODY FUSION LUMBAR ONE-TWO, LUMBAR TWO-THREE, LUMBAR THREE-FOUR,LUMBAR FOUR-FIVE. (N/A Spine Lumbar)  Patient Location: PACU  Anesthesia Type:General  Level of Consciousness: awake, alert  and oriented  Airway & Oxygen Therapy: Patient Spontanous Breathing and Patient connected to face mask oxygen  Post-op Assessment: Report given to RN and Post -op Vital signs reviewed and stable  Post vital signs: Reviewed and stable  Last Vitals:  Vitals Value Taken Time  BP 114/87   Temp    Pulse 87   Resp 15   SpO2 100     Last Pain:  Vitals:   04/25/20 0634  TempSrc: Oral  PainSc:       Patients Stated Pain Goal: 3 (04/25/20 0622)  Complications: No complications documented.

## 2020-04-25 NOTE — Anesthesia Procedure Notes (Signed)
Procedure Name: Intubation Date/Time: 04/25/2020 8:00 AM Performed by: Montez Morita, Chaunce Winkels W, CRNA Pre-anesthesia Checklist: Patient identified, Emergency Drugs available, Suction available and Patient being monitored Patient Re-evaluated:Patient Re-evaluated prior to induction Oxygen Delivery Method: Circle system utilized Preoxygenation: Pre-oxygenation with 100% oxygen Induction Type: IV induction Ventilation: Mask ventilation without difficulty Laryngoscope Size: Miller and 2 Grade View: Grade I Tube type: Oral Tube size: 7.0 mm Number of attempts: 1 Airway Equipment and Method: Stylet and Oral airway Placement Confirmation: ETT inserted through vocal cords under direct vision,  positive ETCO2 and breath sounds checked- equal and bilateral Secured at: 22 cm Tube secured with: Tape Dental Injury: Teeth and Oropharynx as per pre-operative assessment

## 2020-04-26 ENCOUNTER — Encounter (HOSPITAL_COMMUNITY): Payer: Self-pay | Admitting: Neurological Surgery

## 2020-04-26 LAB — GLUCOSE, CAPILLARY
Glucose-Capillary: 93 mg/dL (ref 70–99)
Glucose-Capillary: 127 mg/dL — ABNORMAL HIGH (ref 70–99)
Glucose-Capillary: 123 mg/dL — ABNORMAL HIGH (ref 70–99)
Glucose-Capillary: 119 mg/dL — ABNORMAL HIGH (ref 70–99)

## 2020-04-26 MED ORDER — MUPIROCIN 2 % EX OINT
TOPICAL_OINTMENT | CUTANEOUS | Status: AC
  Filled 2020-04-26: qty 22

## 2020-04-26 MED ORDER — HYDROMORPHONE HCL 2 MG PO TABS
2.0000 mg | ORAL_TABLET | ORAL | Status: DC | PRN
  Administered 2020-04-26 – 2020-04-27 (×9): 2 mg via ORAL
  Filled 2020-04-26 (×9): qty 1

## 2020-04-26 NOTE — Anesthesia Postprocedure Evaluation (Signed)
Anesthesia Post Note  Patient: April Shaffer  Procedure(s) Performed: POSTERIOR LUMBAR INTERBODY FUSION LUMBAR ONE-TWO, LUMBAR TWO-THREE, LUMBAR THREE-FOUR,LUMBAR FOUR-FIVE. (N/A Spine Lumbar)     Patient location during evaluation: PACU Anesthesia Type: General Level of consciousness: awake and alert Pain management: pain level controlled Vital Signs Assessment: post-procedure vital signs reviewed and stable Respiratory status: spontaneous breathing, nonlabored ventilation, respiratory function stable and patient connected to nasal cannula oxygen Cardiovascular status: blood pressure returned to baseline and stable Postop Assessment: no apparent nausea or vomiting Anesthetic complications: no   No complications documented.  Last Vitals:  Vitals:   04/26/20 1617 04/26/20 2012  BP: (!) 109/52 (!) 127/59  Pulse: 79 76  Resp: 16 18  Temp: 36.7 C 36.6 C  SpO2: 100% 100%    Last Pain:  Vitals:   04/26/20 2200  TempSrc:   PainSc: 6                  [name]

## 2020-04-26 NOTE — Evaluation (Signed)
Physical Therapy Evaluation Patient Details Name: April Shaffer MRN: [id number] DOB: 1950-06-01 Today's Date: 04/26/2020   History of Present Illness  70 yo female s/p L2-5 PLIF. PMH CAD, DM Type ll, HTN, sleep apnea, bilateral cataract sx, and R THA (2020).  Clinical Impression  Pt admitted with above diagnosis. At the time of PT eval, pt was able to demonstrate transfers and ambulation with gross supervision for safety to min guard assist and RW for support. Pt was educated on precautions, brace application/wearing schedule, appropriate activity progression, and car transfer. Pt currently with functional limitations due to the deficits listed below (see PT Problem List). Pt will benefit from skilled PT to increase their independence and safety with mobility to allow discharge to the venue listed below.      Follow Up Recommendations No PT follow up;Supervision for mobility/OOB    Equipment Recommendations  None recommended by PT    Recommendations for Other Services       Precautions / Restrictions Precautions Precautions: Back Precaution Booklet Issued: Yes (comment) Precaution Comments: Provided handout and education on back precautions Required Braces or Orthoses: Spinal Brace Spinal Brace: Lumbar corset;Applied in sitting position Restrictions Weight Bearing Restrictions: No      Mobility  Bed Mobility Overal bed mobility: Needs Assistance Bed Mobility: Rolling;Sit to Sidelying Rolling: Supervision Sidelying to sit: Min assist     Sit to sidelying: Min guard General bed mobility comments: VC's for log roll technique. Min guard at LE's to elevate into bed but no real assist provided.    Transfers Overall transfer level: Needs assistance Equipment used: Rolling walker (2 wheeled) Transfers: Sit to/from Stand Sit to Stand: Supervision         General transfer comment: Close supervision for safety and cues for improved posture. VC's for hand placement on seated  surface for safety.  Ambulation/Gait Ambulation/Gait assistance: Min guard Gait Distance (Feet): 200 Feet Assistive device: Rolling walker (2 wheeled) Gait Pattern/deviations: Step-through pattern;Decreased stride length;Drifts right/left;Trunk flexed Gait velocity: Decreased Gait velocity interpretation: <1.8 ft/sec, indicate of risk for recurrent falls General Gait Details: VC's for improved posture, closer walker proximity, and forward gaze. Pt initially with youth RW - switched out to standard size RW for optimal fit.  Stairs            Wheelchair Mobility    Modified Rankin (Stroke Patients Only)       Balance Overall balance assessment: Needs assistance Sitting-balance support: No upper extremity supported;Feet supported Sitting balance-Leahy Scale: Fair     Standing balance support: No upper extremity supported;During functional activity Standing balance-Leahy Scale: Poor Standing balance comment: reliant on UE support for ambulation                             Pertinent Vitals/Pain Pain Assessment: 0-10 Pain Score: 8  Faces Pain Scale: Hurts a little bit Pain Location: Back Pain Descriptors / Indicators: Grimacing;Operative site guarding Pain Intervention(s): Limited activity within patient's tolerance;Monitored during session;Repositioned;RN gave pain meds during session    Home Living Family/patient expects to be discharged to:: Private residence Living Arrangements: Spouse/significant other Available Help at Discharge: Available 24 hours/day (boyfriend) Type of Home: House Home Access: Stairs to enter   [employer] of Steps: 1 Home Layout: One level Home Equipment: Environmental consultant - 2 wheels;Bedside commode;Shower seat;Hand held shower head;Adaptive equipment      Prior Function Level of Independence: Independent  Hand Dominance        Extremity/Trunk Assessment   Upper Extremity Assessment Upper Extremity  Assessment: Defer to OT evaluation    Lower Extremity Assessment Lower Extremity Assessment: Generalized weakness    Cervical / Trunk Assessment Cervical / Trunk Assessment: Other exceptions Cervical / Trunk Exceptions: s/p lumbar fusion  Communication   Communication: No difficulties  Cognition Arousal/Alertness: Awake/alert Behavior During Therapy: WFL for tasks assessed/performed Overall Cognitive Status: Within Functional Limits for tasks assessed                                        General Comments General comments (skin integrity, edema, etc.): Drain in place    Exercises     Assessment/Plan    PT Assessment Patient needs continued PT services  PT Problem List Decreased strength;Decreased activity tolerance;Decreased balance;Decreased mobility;Decreased knowledge of use of DME;Decreased safety awareness;Decreased knowledge of precautions;Pain       PT Treatment Interventions DME instruction;Gait training;Stair training;Functional mobility training;Therapeutic activities;Therapeutic exercise;Neuromuscular re-education;Patient/family education    PT Goals (Current goals can be found in the Care Plan section)  Acute Rehab PT Goals Patient Stated Goal: Home tomorrow PT Goal Formulation: With patient Time For Goal Achievement: 05/03/20 Potential to Achieve Goals: Good    Frequency Min 5X/week   Barriers to discharge        Co-evaluation               AM-PAC PT "6 Clicks" Mobility  Outcome Measure Help needed turning from your back to your side while in a flat bed without using bedrails?: None Help needed moving from lying on your back to sitting on the side of a flat bed without using bedrails?: A Little Help needed moving to and from a bed to a chair (including a wheelchair)?: A Little Help needed standing up from a chair using your arms (e.g., wheelchair or bedside chair)?: A Little Help needed to walk in hospital room?: A Little Help  needed climbing 3-5 steps with a railing? : A Little 6 Click Score: 19    End of Session Equipment Utilized During Treatment: Gait belt;Back brace Activity Tolerance: Patient tolerated treatment well Patient left: in bed;with call bell/phone within reach Nurse Communication: Mobility status PT Visit Diagnosis: Unsteadiness on feet (R26.81);Pain Pain - part of body:  (back)    Time: 2952-8413 PT Time Calculation (min) (ACUTE ONLY): 27 min   Charges:   PT Evaluation $PT Eval Low Complexity: 1 Low PT Treatments $Gait Training: 8-22 mins        Conni Slipper, PT, DPT Acute Rehabilitation Services Pager: [phone] Office: [phone]   April Shaffer 04/26/2020, 12:42 PM

## 2020-04-26 NOTE — Progress Notes (Signed)
Subjective: Patient reports appropriate moderate back soreness, slept really well, has walked, no leg pain or NTW  Objective: Vital signs in last 24 hours: Temp:  [97.1 F (36.2 C)-98 F (36.7 C)] 97.7 F (36.5 C) (03/01 0610) Pulse Rate:  [67-85] 85 (03/01 0610) Resp:  [11-24] 16 (03/01 0610) BP: (107-137)/(57-74) 137/69 (03/01 0610) SpO2:  [95 %-100 %] 98 % (03/01 0610)  Intake/Output from previous day: 02/28 0701 - 03/01 0700 In: 2950 [I.V.:2400; Blood:300; IV Piggyback:250] Out: 1290 [Urine:315; Drains:375; Blood:600] Intake/Output this shift: No intake/output data recorded.  Neurologic: Alert and oriented X 3, normal strength and tone. Normal symmetric reflexes. Normal coordination and gait  Lab Results: Lab Results  Component Value Date   WBC 7.6 04/21/2020   HGB 12.6 04/21/2020   HCT 37.3 04/21/2020   MCV 90.5 04/21/2020   PLT 258 04/21/2020   Lab Results  Component Value Date   INR 1.0 04/21/2020   BMET Lab Results  Component Value Date   NA 137 04/21/2020   K 3.2 (L) 04/21/2020   CL 102 04/21/2020   CO2 25 04/21/2020   GLUCOSE 102 (H) 04/21/2020   BUN 8 04/21/2020   CREATININE 0.80 04/21/2020   CALCIUM 8.9 04/21/2020    Studies/Results: DG Lumbar Spine 2-3 Views  Result Date: 04/25/2020 CLINICAL DATA:  PLIF L1 through L5. EXAM: DG C-ARM 1-60 MIN; LUMBAR SPINE - 2-3 VIEW FLUOROSCOPY TIME:  Fluoroscopy Time:  2 minutes and 41 seconds. COMPARISON:  March 22, 2020 FINDINGS: Eight C-arm fluoroscopic images were obtained intraoperatively and submitted for post operative interpretation. These demonstrate bilateral pedicle screws at L1, L2, L3, L4, and L5 with intervening rods and spacers. Please see the performing provider's procedural report for further detail. IMPRESSION: L1-L5 PLIF. Electronically Signed   By: Feliberto Harts MD   On: 04/25/2020 15:30   DG C-Arm 1-60 Min  Result Date: 04/25/2020 CLINICAL DATA:  PLIF L1 through L5. EXAM: DG C-ARM 1-60  MIN; LUMBAR SPINE - 2-3 VIEW FLUOROSCOPY TIME:  Fluoroscopy Time:  2 minutes and 41 seconds. COMPARISON:  March 22, 2020 FINDINGS: Eight C-arm fluoroscopic images were obtained intraoperatively and submitted for post operative interpretation. These demonstrate bilateral pedicle screws at L1, L2, L3, L4, and L5 with intervening rods and spacers. Please see the performing provider's procedural report for further detail. IMPRESSION: L1-L5 PLIF. Electronically Signed   By: Feliberto Harts MD   On: 04/25/2020 15:30    Assessment/Plan: Doing great, drain with 375 out so will continue it, PT/OT/ pain control  Estimated body mass index is 29.23 kg/m as calculated from the following:   Height as of this encounter: 5\' 3"  (1.6 m).   Weight as of this encounter: 74.8 kg.    LOS: 1 day    04/26/2020, 7:45 AM

## 2020-04-26 NOTE — Evaluation (Signed)
Occupational Therapy Evaluation Patient Details Name: April Shaffer MRN: [id number] DOB: 10/05/1950 Today's Date: 04/26/2020    History of Present Illness 70 yo female s/p L2-5 PLIF. PMH CAD, DM Type ll, HTN, sleep apnea, bilateral cataract sx, and R THA (2020).   Clinical Impression   PTA, pt was living with her boyfriend and was independent. Currently, pt requires Supervison for UB ADLs, Min A for LB ADLs with AE, and Min Guard A for functional mobility using RW. Provided education and handout on back precautions, grooming, brace management, bed mobility, UB ADLs, LB ADLs, and toileting; pt demonstrated and verbalized understanding. Pt would benefit from further acute OT to address safe tub transfers. Recommend dc home once medically stable per physician.    Follow Up Recommendations  No OT follow up;Supervision/Assistance - 24 hour    Equipment Recommendations  None recommended by OT    Recommendations for Other Services PT consult     Precautions / Restrictions Precautions Precautions: Back Precaution Booklet Issued: Yes (comment) Precaution Comments: Provided handout and education on back precautions Required Braces or Orthoses: Spinal Brace Spinal Brace: Lumbar corset;Applied in sitting position      Mobility Bed Mobility Overal bed mobility: Needs Assistance Bed Mobility: Rolling;Sidelying to Sit Rolling: Min assist Sidelying to sit: Min assist       General bed mobility comments: Min A to roll to left and then elevate trunk    Transfers Overall transfer level: Needs assistance Equipment used: Rolling walker (2 wheeled) Transfers: Sit to/from Stand Sit to Stand: Min guard         General transfer comment: Min guard A for safety    Balance Overall balance assessment: Needs assistance Sitting-balance support: No upper extremity supported;Feet supported Sitting balance-Leahy Scale: Good     Standing balance support: No upper extremity supported;During  functional activity Standing balance-Leahy Scale: Fair                             ADL either performed or assessed with clinical judgement   ADL Overall ADL's : Needs assistance/impaired Eating/Feeding: Set up;Sitting   Grooming: Min guard;Standing Grooming Details (indicate cue type and reason): Educating pt compensatory techniques for oral care Upper Body Bathing: Set up;Supervision/ safety;Sitting   Lower Body Bathing: Minimal assistance;Sit to/from stand   Upper Body Dressing : Supervision/safety;Set up;Sitting Upper Body Dressing Details (indicate cue type and reason): donning shirt and back brace Lower Body Dressing: Sit to/from stand;Cueing for back precautions;Cueing for compensatory techniques;With adaptive equipment;Minimal assistance Lower Body Dressing Details (indicate cue type and reason): Pt using reacher to don underwear and pants. Min A for pulling pants up as they were sticking to socks Toilet Transfer: Min guard;Ambulation;RW (simulated to recliner)     Toileting - Clothing Manipulation Details (indicate cue type and reason): Educating pt on bending knees     Functional mobility during ADLs: Min guard;Rolling walker General ADL Comments: Pt performing at Supervision-Min A level. Very motivated     Financial controller      Pertinent Vitals/Pain Pain Assessment: Faces Faces Pain Scale: Hurts a little bit Pain Location: Back Pain Descriptors / Indicators: Constant;Grimacing Pain Intervention(s): Monitored during session;Limited activity within patient's tolerance;Repositioned     Hand Dominance     Extremity/Trunk Assessment Upper Extremity Assessment Upper Extremity Assessment: Overall WFL for tasks assessed   Lower Extremity Assessment Lower Extremity Assessment: Defer  to PT evaluation   Cervical / Trunk Assessment Cervical / Trunk Assessment: Other exceptions Cervical / Trunk Exceptions: s/p lumbar fusion    Communication Communication Communication: No difficulties   Cognition Arousal/Alertness: Awake/alert Behavior During Therapy: WFL for tasks assessed/performed Overall Cognitive Status: Within Functional Limits for tasks assessed                                     General Comments  Drain in place    Exercises     Shoulder Instructions      Home Living Family/patient expects to be discharged to:: Private residence Living Arrangements: Spouse/significant other Available Help at Discharge: Available 24 hours/day (boyfriend) Type of Home: House Home Access: Stairs to enter [employer] of Steps: 1   Home Layout: One level     Bathroom Shower/Tub: Chief Strategy Officer: Standard (BSC over)     Home Equipment: Environmental consultant - 2 wheels;Bedside commode;Shower seat;Hand held shower head;Adaptive equipment Adaptive Equipment: Reacher        Prior Functioning/Environment Level of Independence: Independent                 OT Problem List: Decreased strength;Decreased range of motion;Impaired balance (sitting and/or standing);Decreased activity tolerance;Decreased cognition;Decreased knowledge of use of DME or AE;Decreased safety awareness;Pain      OT Treatment/Interventions: Self-care/ADL training;Therapeutic exercise;Energy conservation;DME and/or AE instruction;Therapeutic activities;Patient/family education    OT Goals(Current goals can be found in the care plan section) Acute Rehab OT Goals Patient Stated Goal: Go home in a few days OT Goal Formulation: With patient Time For Goal Achievement: 05/10/20 Potential to Achieve Goals: Good  OT Frequency: Min 2X/week   Barriers to D/C:            Co-evaluation              AM-PAC OT "6 Clicks" Daily Activity     Outcome Measure Help from another person eating meals?: None Help from another person taking care of personal grooming?: A Little Help from another person  toileting, which includes using toliet, bedpan, or urinal?: A Little Help from another person bathing (including washing, rinsing, drying)?: A Little Help from another person to put on and taking off regular upper body clothing?: A Little Help from another person to put on and taking off regular lower body clothing?: A Little 6 Click Score: 19   End of Session Equipment Utilized During Treatment: Rolling walker;Back brace Nurse Communication: Mobility status  Activity Tolerance: Patient tolerated treatment well Patient left: in chair;with call bell/phone within reach  OT Visit Diagnosis: Unsteadiness on feet (R26.81);Other abnormalities of gait and mobility (R26.89);Muscle weakness (generalized) (M62.81);Pain Pain - part of body:  (Back)                Time: 6295-2841 OT Time Calculation (min): 22 min Charges:  OT General Charges $OT Visit: 1 Visit OT Evaluation $OT Eval Low Complexity: 1 Low  April Shaffer MSOT, OTR/L Acute Rehab Pager: [phone] Office: [phone]  April Shaffer 04/26/2020, 9:42 AM

## 2020-04-27 LAB — GLUCOSE, CAPILLARY: Glucose-Capillary: 107 mg/dL — ABNORMAL HIGH (ref 70–99)

## 2020-04-27 MED ORDER — HYDROMORPHONE HCL 2 MG PO TABS: 2.0000 mg | ORAL_TABLET | Freq: Three times a day (TID) | ORAL | 0 refills | Status: AC | PRN

## 2020-04-27 MED ORDER — METHOCARBAMOL 500 MG PO TABS: 500.0000 mg | ORAL_TABLET | Freq: Four times a day (QID) | ORAL | 0 refills | Status: DC

## 2020-04-27 MED FILL — Heparin Sodium (Porcine) Inj 1000 Unit/ML: INTRAMUSCULAR | Qty: 30 | Status: AC

## 2020-04-27 MED FILL — Sodium Chloride IV Soln 0.9%: INTRAVENOUS | Qty: 1000 | Status: AC

## 2020-04-27 NOTE — Progress Notes (Signed)
Physical Therapy Treatment Patient Details Name: April Shaffer MRN: [id number] DOB: Jan 26, 1951 Today's Date: 04/27/2020    History of Present Illness 70 yo female s/p L2-5 PLIF. PMH CAD, DM Type ll, HTN, sleep apnea, bilateral cataract sx, and R THA (2020).    PT Comments    Pt progressing well with post-op mobility. She was able to demonstrate transfers and ambulation with gross supervision for safety and RW for support. Pt was educated on precautions, brace application/wearing schedule, appropriate activity progression, and car transfer. Pt reports main barrier to d/c for her is bed mobility. We practiced with HOB flat and rails lowered to simulate home environment and pt was able to complete with light supervision for safety. No mobility barriers identified from a PT standpoint. Will continue to follow.      Follow Up Recommendations  No PT follow up;Supervision for mobility/OOB     Equipment Recommendations  None recommended by PT    Recommendations for Other Services       Precautions / Restrictions Precautions Precautions: Back Precaution Booklet Issued: Yes (comment) Precaution Comments: Pt was cued for precautions during functional mobility. Required Braces or Orthoses: Spinal Brace Spinal Brace: Lumbar corset;Applied in sitting position Restrictions Weight Bearing Restrictions: No    Mobility  Bed Mobility Overal bed mobility: Needs Assistance Bed Mobility: Rolling;Sit to Sidelying Rolling: Modified independent (Device/Increase time) Sidelying to sit: Min guard     Sit to sidelying: Supervision General bed mobility comments: pt with good carryover of log roll technique. HOB flat and rails lowered to simulate home environment.    Transfers Overall transfer level: Needs assistance Equipment used: Rolling walker (2 wheeled) Transfers: Sit to/from Stand Sit to Stand: Supervision         General transfer comment: Light supervision provided for safety. Pt  continues to require cues for hand placement on seated surface for safety.  Ambulation/Gait Ambulation/Gait assistance: Supervision Gait Distance (Feet): 250 Feet Assistive device: Rolling walker (2 wheeled) Gait Pattern/deviations: Step-through pattern;Decreased stride length;Drifts right/left;Trunk flexed Gait velocity: Decreased Gait velocity interpretation: 1.31 - 2.62 ft/sec, indicative of limited community ambulator General Gait Details: VC's for improved posture, closer walker proximity, and forward gaze.Slow but generally steady.   Stairs Stairs:  (Pt declined stair training)           Wheelchair Mobility    Modified Rankin (Stroke Patients Only)       Balance Overall balance assessment: Needs assistance Sitting-balance support: No upper extremity supported;Feet supported Sitting balance-Leahy Scale: Fair     Standing balance support: No upper extremity supported;During functional activity Standing balance-Leahy Scale: Fair Standing balance comment: able to complete groomng tasks at sink with supervision with no LOB                            Cognition Arousal/Alertness: Awake/alert Behavior During Therapy: WFL for tasks assessed/performed Overall Cognitive Status: Within Functional Limits for tasks assessed                                        Exercises      General Comments General comments (skin integrity, edema, etc.): drain in place, provided full education on all LB AE for LB ADLs      Pertinent Vitals/Pain Pain Assessment: 0-10 Pain Score: 7  Faces Pain Scale: Hurts even more Pain Location: Back Pain Descriptors / Indicators:  Grimacing;Operative site guarding;Discomfort Pain Intervention(s): Limited activity within patient's tolerance;Monitored during session;Repositioned    Home Living                      Prior Function            PT Goals (current goals can now be found in the care plan section)  Acute Rehab PT Goals Patient Stated Goal: to go home PT Goal Formulation: With patient Time For Goal Achievement: 05/03/20 Potential to Achieve Goals: Good Progress towards PT goals: Progressing toward goals    Frequency    Min 5X/week      PT Plan Current plan remains appropriate    Co-evaluation              AM-PAC PT "6 Clicks" Mobility   Outcome Measure  Help needed turning from your back to your side while in a flat bed without using bedrails?: None Help needed moving from lying on your back to sitting on the side of a flat bed without using bedrails?: A Little Help needed moving to and from a bed to a chair (including a wheelchair)?: A Little Help needed standing up from a chair using your arms (e.g., wheelchair or bedside chair)?: A Little Help needed to walk in hospital room?: A Little Help needed climbing 3-5 steps with a railing? : A Little 6 Click Score: 19    End of Session Equipment Utilized During Treatment: Gait belt;Back brace Activity Tolerance: Patient tolerated treatment well Patient left: in bed;with call bell/phone within reach Nurse Communication: Mobility status PT Visit Diagnosis: Unsteadiness on feet (R26.81);Pain Pain - part of body:  (back)     Time: 1505-6979 PT Time Calculation (min) (ACUTE ONLY): 16 min  Charges:  $Gait Training: 8-22 mins                     Conni Slipper, PT, DPT Acute Rehabilitation Services Pager: [phone] Office: [phone]    Marylynn Pearson 04/27/2020, 9:09 AM

## 2020-04-27 NOTE — Discharge Summary (Signed)
Physician Discharge Summary  Patient ID: April Shaffer MRN: [id number] DOB/AGE: [birth year] 70 y.o.  Admit date: 04/25/2020 Discharge date: 04/27/2020  Admission Diagnoses:   1.  Spondylolisthesis L2-3 L3-4 L4-5, 2.  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  Severe spinal stenosis L1-L2 , L2 3, L3-4, L4-5, 4.  Severe degenerative disc disease with loss of disc base height Modic endplate changes V9-1, 5.  Back pain with radiculopathy    Discharge Diagnoses: same   Discharged Condition: good  Hospital Course: The patient was admitted on 04/25/2020 and taken to the operating room where the patient underwent PLIF L2-3, L3-4, L4-5. The patient tolerated the procedure well and was taken to the recovery room and then to the floor in stable condition. The hospital course was routine. There were no complications. The wound remained clean dry and intact. Pt had appropriate back soreness. No complaints of leg pain or new N/T/W. The patient remained afebrile with stable vital signs, and tolerated a regular diet. The patient continued to increase activities, and pain was well controlled with oral pain medications.   Consults: None  Significant Diagnostic Studies:  Results for orders placed or performed during the hospital encounter of 04/25/20  Glucose, capillary  Result Value Ref Range   Glucose-Capillary 109 (H) 70 - 99 mg/dL  Glucose, capillary  Result Value Ref Range   Glucose-Capillary 180 (H) 70 - 99 mg/dL   Comment 1 Notify RN    Comment 2 Document in Chart   Glucose, capillary  Result Value Ref Range   Glucose-Capillary 307 (H) 70 - 99 mg/dL   Comment 1 Notify RN    Comment 2 Document in Chart   Glucose, capillary  Result Value Ref Range   Glucose-Capillary 93 70 - 99 mg/dL   Comment 1 Notify RN    Comment 2 Document in Chart   Glucose, capillary  Result Value Ref Range   Glucose-Capillary 127 (H) 70 - 99 mg/dL   Comment 1 Notify RN    Comment 2 Document in  Chart   Glucose, capillary  Result Value Ref Range   Glucose-Capillary 123 (H) 70 - 99 mg/dL   Comment 1 Notify RN    Comment 2 Document in Chart   Glucose, capillary  Result Value Ref Range   Glucose-Capillary 119 (H) 70 - 99 mg/dL   Comment 1 Notify RN    Comment 2 Document in Chart   Glucose, capillary  Result Value Ref Range   Glucose-Capillary 107 (H) 70 - 99 mg/dL   Comment 1 Notify RN    Comment 2 Document in Chart     Chest 2 View  Result Date: 04/22/2020 CLINICAL DATA:  Preoperative study for back surgery. No chest complaints. EXAM: CHEST - 2 VIEW COMPARISON:  Chest x-ray dated September 21, 2011. FINDINGS: The heart size and mediastinal contours are within normal limits. Both lungs are clear. The visualized skeletal structures are unremarkable. IMPRESSION: No active cardiopulmonary disease. Electronically Signed   By: Titus Dubin M.D.   On: 04/22/2020 13:52   DG Lumbar Spine 2-3 Views  Result Date: 04/25/2020 CLINICAL DATA:  PLIF L1 through L5. EXAM: DG C-ARM 1-60 MIN; LUMBAR SPINE - 2-3 VIEW FLUOROSCOPY TIME:  Fluoroscopy Time:  2 minutes and 41 seconds. COMPARISON:  March 22, 2020 FINDINGS: Eight C-arm fluoroscopic images were obtained intraoperatively and submitted for post operative interpretation. These demonstrate bilateral pedicle screws at L1, L2, L3, L4, and L5 with intervening rods and  spacers. Please see the performing provider's procedural report for further detail. IMPRESSION: L1-L5 PLIF. Electronically Signed   By: Margaretha Sheffield MD   On: 04/25/2020 15:30   DG C-Arm 1-60 Min  Result Date: 04/25/2020 CLINICAL DATA:  PLIF L1 through L5. EXAM: DG C-ARM 1-60 MIN; LUMBAR SPINE - 2-3 VIEW FLUOROSCOPY TIME:  Fluoroscopy Time:  2 minutes and 41 seconds. COMPARISON:  March 22, 2020 FINDINGS: Eight C-arm fluoroscopic images were obtained intraoperatively and submitted for post operative interpretation. These demonstrate bilateral pedicle screws at L1, L2, L3, L4, and  L5 with intervening rods and spacers. Please see the performing provider's procedural report for further detail. IMPRESSION: L1-L5 PLIF. Electronically Signed   By: Margaretha Sheffield MD   On: 04/25/2020 15:30    Antibiotics:  Anti-infectives (From admission, onward)   Start     Dose/Rate Route Frequency Ordered Stop   04/25/20 1915  ceFAZolin (ANCEF) IVPB 2g/100 mL premix        2 g 200 mL/hr over 30 Minutes Intravenous Every 8 hours 04/25/20 1816 04/26/20 0229   04/25/20 0632  ceFAZolin (ANCEF) 2-4 GM/100ML-% IVPB       Note to Pharmacy: Gregery Na   : cabinet override      04/25/20 0632 04/25/20 0830   04/25/20 0615  ceFAZolin (ANCEF) IVPB 2g/100 mL premix        2 g 200 mL/hr over 30 Minutes Intravenous On call to O.R. 04/25/20 0608 04/25/20 1210      Discharge Exam: Blood pressure (!) 125/59, pulse 85, temperature 97.7 F (36.5 C), temperature source Oral, resp. rate 16, height '5\' 3"'  (1.6 m), weight 74.8 kg, SpO2 99 %. Neurologic: Grossly normal Ambulating and voiding well, incision cdi  Discharge Medications:   Allergies as of 04/27/2020      Reactions   Lisinopril-hydrochlorothiazide Other (See Comments)   Angioedema  - Tongue swelling    Codeine Itching   Oxycodone Itching   Simvastatin Other (See Comments)   Muscle pain   Tramadol Itching      Medication List    STOP taking these medications   celecoxib 100 MG capsule Commonly known as: CELEBREX   clopidogrel 75 MG tablet Commonly known as: PLAVIX     TAKE these medications   acetaminophen 650 MG CR tablet Commonly known as: TYLENOL Take 1,300 mg by mouth daily as needed for pain.   allopurinol 300 MG tablet Commonly known as: ZYLOPRIM Take 1 tablet (300 mg total) by mouth daily.   amLODipine 10 MG tablet Commonly known as: NORVASC Take 1 tablet (10 mg total) by mouth daily.   aspirin EC 81 MG tablet Take 81 mg by mouth daily.   atorvastatin 80 MG tablet Commonly known as: LIPITOR TAKE 1 TABLET  BY MOUTH ONCE DAILY WITH  EVENING  MEAL What changed:   how much to take  how to take this  when to take this  additional instructions   blood glucose meter kit and supplies Kit Check sugars twice daily   CALCIUM 500 + D3 PO Take 1 tablet by mouth once a week.   carvedilol 20 MG 24 hr capsule Commonly known as: COREG CR Take 1 capsule (20 mg total) by mouth daily.   ezetimibe 10 MG tablet Commonly known as: ZETIA TAKE 1 TABLET EVERY DAY (NEED MD APPOINTMENT) What changed: See the new instructions.   gabapentin 300 MG capsule Commonly known as: NEURONTIN 1 cap by mouth in morning, 1 cap by mouth midday  and 2 caps by mouth before bedtime. What changed:   how much to take  how to take this  when to take this  additional instructions   glucose blood test strip Check sugars twice daily   HYDROmorphone 2 MG tablet Commonly known as: Dilaudid Take 1 tablet (2 mg total) by mouth every 8 (eight) hours as needed for up to 5 days for severe pain.   metFORMIN 500 MG tablet Commonly known as: GLUCOPHAGE Take 1,000 mg by mouth daily.   methocarbamol 500 MG tablet Commonly known as: Robaxin Take 1 tablet (500 mg total) by mouth 4 (four) times daily.   Mitigare 0.6 MG Caps Generic drug: Colchicine TAKE 1 CAPSULE TWICE DAILY AS NEEDED FOR GOUT FLARE AND HOLD ATORVASTATIN ON DAYS TAKING MITIGARE What changed: See the new instructions.   SALONPAS PAIN RELIEF PATCH EX Place 1 patch onto the skin daily as needed (pain).            Durable Medical Equipment  (From admission, onward)         Start     Ordered   04/25/20 1817  DME Walker rolling  Once       Question:  Patient needs a walker to treat with the following condition  Answer:  S/P lumbar fusion   04/25/20 1816   04/25/20 1817  DME 3 n 1  Once        04/25/20 1816          Disposition: home   Final Dx: PLIF L2-3, 3-4, 4-5  Discharge Instructions    Call MD for:  difficulty breathing, headache  or visual disturbances   Complete by: As directed    Call MD for:  hives   Complete by: As directed    Call MD for:  persistant dizziness or light-headedness   Complete by: As directed    Call MD for:  persistant nausea and vomiting   Complete by: As directed    Call MD for:  redness, tenderness, or signs of infection (pain, swelling, redness, odor or green/yellow discharge around incision site)   Complete by: As directed    Call MD for:  severe uncontrolled pain   Complete by: As directed    Call MD for:  temperature >100.4   Complete by: As directed    Diet - low sodium heart healthy   Complete by: As directed    Driving Restrictions   Complete by: As directed    No driving for 2 weeks, no riding in the car for 1 week   Increase activity slowly   Complete by: As directed    Lifting restrictions   Complete by: As directed    No lifting more than 8 lbs   Remove dressing in 48 hours   Complete by: As directed          Signed: Ocie Cornfield Sheril Hammond 04/27/2020, 10:55 AM

## 2020-04-27 NOTE — Progress Notes (Signed)
Pt doing well. Pt given D/C instructions with verbal understanding. Rx's were sent to the pharmacy by MD. Pt's incision is clean and dry with no sign of infection. Pt's IV and Hemovac were removed prior to D/C. Pt D/C'd home via wheelchair per MD order. Pt is stable @ D/C and has no other needs at this time. Fahd Galea, RN   ?

## 2020-04-27 NOTE — Progress Notes (Signed)
Occupational Therapy Treatment Patient Details Name: April Shaffer MRN: [id number] DOB: Nov 15, 1950 Today's Date: 04/27/2020    History of present illness 70 yo female s/p L2-5 PLIF. PMH CAD, DM Type ll, HTN, sleep apnea, bilateral cataract sx, and R THA (2020).   OT comments  Pt making steady progress towards OT goals this session. Session focus on functional mobility, BADL reeducation and compensatory methods for maintaining back precautions during ADL participation. Pt currently requires supervision for grooming tasks needing cues for compensatory methods, supervision for UB ADLs and min guard- supervision for LB ADLs using AE. Pt completed household distance functional mobility with RW with min guard assist. DC plan currently remains appropriate, will continue to follow acutely per POC.    Follow Up Recommendations  No OT follow up;Supervision/Assistance - 24 hour    Equipment Recommendations  None recommended by OT    Recommendations for Other Services      Precautions / Restrictions Precautions Precautions: Back Precaution Booklet Issued:  (handout provided during eval per chart review) Precaution Comments: pt initially stated 2/3 precautions but after education pt stating 3/3 precautions but continues to need intermittent cues to incorporate back precautions into ADLs Required Braces or Orthoses: Spinal Brace Spinal Brace: Lumbar corset;Applied in sitting position Restrictions Weight Bearing Restrictions: No       Mobility Bed Mobility Overal bed mobility: Needs Assistance Bed Mobility: Rolling;Sidelying to Sit Rolling: Supervision Sidelying to sit: Min guard       General bed mobility comments: pt with good carryover of log roll technique needing no more than minguard to assist with elevating trunk for safety    Transfers Overall transfer level: Needs assistance Equipment used: Rolling walker (2 wheeled) Transfers: Sit to/from Stand Sit to Stand: Supervision          General transfer comment: pt completed multiple sit<>stands during ADLs with pt needing no more than supervision for safety    Balance Overall balance assessment: Needs assistance Sitting-balance support: No upper extremity supported;Feet supported Sitting balance-Leahy Scale: Fair     Standing balance support: No upper extremity supported;During functional activity Standing balance-Leahy Scale: Fair Standing balance comment: able to complete groomng tasks at sink with supervision with no LOB                           ADL either performed or assessed with clinical judgement   ADL Overall ADL's : Needs assistance/impaired     Grooming: Oral care;Standing;Cueing for compensatory techniques;Supervision/safety Grooming Details (indicate cue type and reason): gross supervision for standing grooming tasks, cues needed to incorporate compensatory methods into grooming tasksk Upper Body Bathing: Set up;Supervision/ safety;Sitting Upper Body Bathing Details (indicate cue type and reason): sitting EOB Lower Body Bathing: Min guard;Sitting/lateral leans;Adhering to back precautions;With adaptive equipment Lower Body Bathing Details (indicate cue type and reason): pt able to complete LB bathing from EOB using lateral leans to complete LB bathing Upper Body Dressing : Supervision/safety;Set up;Sitting Upper Body Dressing Details (indicate cue type and reason): to don brace and don [place] shirt from EOB Lower Body Dressing: Supervision/safety;Set up;With adaptive equipment;Cueing for sequencing Lower Body Dressing Details (indicate cue type and reason): pt able to use reacher to don underwear and additionally able to don socks with sock aid needing cues for sequencing of task, feel pt could also use figure four method to don socks if neeed Toilet Transfer: Min guard;Ambulation;RW;Grab Paramedic Details (indicate cue type and reason): minguard for  safety Toileting- Clothing Manipulation and Hygiene: Supervision/safety;Cueing for safety;Sitting/lateral lean Toileting - Clothing Manipulation Details (indicate cue type and reason): cues needed for compesatory methods to complete anterior pericare post toileting   Tub/Shower Transfer Details (indicate cue type and reason): pt reports tub shower at home with shower Shaffer, education provided on initially completing EOB bathing and having assistance for tub transfer Functional mobility during ADLs: Min guard;Rolling walker General ADL Comments: pt completed EOB bathing, standing grooming task, UB/LB dressing from EOB and toilet transfer during sesssion     Vision       Perception     Praxis      Cognition Arousal/Alertness: Awake/alert Behavior During Therapy: WFL for tasks assessed/performed Overall Cognitive Status: Within Functional Limits for tasks assessed                                          Exercises     Shoulder Instructions       General Comments drain in place, provided full education on all LB AE for LB ADLs    Pertinent Vitals/ Pain       Pain Assessment: 0-10 Pain Score: 7  Pain Location: Back Pain Descriptors / Indicators: Grimacing;Operative site guarding;Discomfort Pain Intervention(s): Limited activity within patient's tolerance;Monitored during session;Repositioned  Home Living                                          Prior Functioning/Environment              Frequency  Min 2X/week        Progress Toward Goals  OT Goals(current goals can now be found in the care plan section)  Progress towards OT goals: Progressing toward goals  Acute Rehab OT Goals Patient Stated Goal: to go home OT Goal Formulation: With patient Time For Goal Achievement: 05/10/20 Potential to Achieve Goals: Good  Plan Discharge plan remains appropriate;Frequency remains appropriate    Co-evaluation                  AM-PAC OT "6 Clicks" Daily Activity     Outcome Measure   Help from another person eating meals?: None Help from another person taking care of personal grooming?: A Little Help from another person toileting, which includes using toliet, bedpan, or urinal?: A Little Help from another person bathing (including washing, rinsing, drying)?: A Little Help from another person to put on and taking off regular upper body clothing?: None Help from another person to put on and taking off regular lower body clothing?: A Little 6 Click Score: 20    End of Session Equipment Utilized During Treatment: Rolling walker;Back brace  OT Visit Diagnosis: Unsteadiness on feet (R26.81);Other abnormalities of gait and mobility (R26.89);Muscle weakness (generalized) (M62.81);Pain   Activity Tolerance Patient tolerated treatment well   Patient Left Other (comment) (handed off to PT)   Nurse Communication Mobility status        Time: 4098-1191 OT Time Calculation (min): 37 min  Charges: OT General Charges $OT Visit: 1 Visit OT Treatments $Self Care/Home Management : 23-37 mins  Lenor Derrick., COTA/L Acute Rehabilitation Services [phone]    Barron Schmid 04/27/2020, 8:50 AM

## 2020-05-06 ENCOUNTER — Ambulatory Visit: Payer: Medicare HMO

## 2020-05-10 DIAGNOSIS — Z6827 Body mass index (BMI) 27.0-27.9, adult: Secondary | ICD-10-CM | POA: Insufficient documentation

## 2020-05-24 ENCOUNTER — Encounter (HOSPITAL_COMMUNITY): Payer: Self-pay | Admitting: Neurological Surgery

## 2020-05-24 ENCOUNTER — Ambulatory Visit: Payer: Medicare HMO

## 2020-05-24 ENCOUNTER — Other Ambulatory Visit: Payer: Self-pay | Admitting: Neurological Surgery

## 2020-05-24 ENCOUNTER — Inpatient Hospital Stay (HOSPITAL_COMMUNITY)
Admission: AD | Admit: 2020-05-24 | Discharge: 2020-06-01 | DRG: 857 | Disposition: A | Discharge status: Home Health | Payer: Medicare HMO | Source: Ambulatory Visit | Attending: Neurological Surgery | Admitting: Neurological Surgery

## 2020-05-24 DIAGNOSIS — F17210 Nicotine dependence, cigarettes, uncomplicated: Secondary | ICD-10-CM | POA: Diagnosis present

## 2020-05-24 DIAGNOSIS — Z79899 Other long term (current) drug therapy: Secondary | ICD-10-CM | POA: Diagnosis not present

## 2020-05-24 DIAGNOSIS — Z90710 Acquired absence of both cervix and uterus: Secondary | ICD-10-CM | POA: Diagnosis not present

## 2020-05-24 DIAGNOSIS — E871 Hypo-osmolality and hyponatremia: Secondary | ICD-10-CM | POA: Diagnosis present

## 2020-05-24 DIAGNOSIS — Y848 Other medical procedures as the cause of abnormal reaction of the patient, or of later complication, without mention of misadventure at the time of the procedure: Secondary | ICD-10-CM | POA: Diagnosis present

## 2020-05-24 DIAGNOSIS — T8149XA Infection following a procedure, other surgical site, initial encounter: Secondary | ICD-10-CM | POA: Diagnosis present

## 2020-05-24 DIAGNOSIS — Z7982 Long term (current) use of aspirin: Secondary | ICD-10-CM

## 2020-05-24 DIAGNOSIS — I10 Essential (primary) hypertension: Secondary | ICD-10-CM | POA: Diagnosis present

## 2020-05-24 DIAGNOSIS — B9562 Methicillin resistant Staphylococcus aureus infection as the cause of diseases classified elsewhere: Secondary | ICD-10-CM | POA: Diagnosis not present

## 2020-05-24 DIAGNOSIS — T8141XA Infection following a procedure, superficial incisional surgical site, initial encounter: Secondary | ICD-10-CM | POA: Diagnosis present

## 2020-05-24 DIAGNOSIS — I2510 Atherosclerotic heart disease of native coronary artery without angina pectoris: Secondary | ICD-10-CM | POA: Diagnosis not present

## 2020-05-24 DIAGNOSIS — Z96641 Presence of right artificial hip joint: Secondary | ICD-10-CM | POA: Diagnosis present

## 2020-05-24 DIAGNOSIS — Z20822 Contact with and (suspected) exposure to covid-19: Secondary | ICD-10-CM | POA: Diagnosis not present

## 2020-05-24 DIAGNOSIS — Z7984 Long term (current) use of oral hypoglycemic drugs: Secondary | ICD-10-CM | POA: Diagnosis not present

## 2020-05-24 DIAGNOSIS — E785 Hyperlipidemia, unspecified: Secondary | ICD-10-CM | POA: Diagnosis not present

## 2020-05-24 DIAGNOSIS — E119 Type 2 diabetes mellitus without complications: Secondary | ICD-10-CM | POA: Diagnosis not present

## 2020-05-24 DIAGNOSIS — L089 Local infection of the skin and subcutaneous tissue, unspecified: Secondary | ICD-10-CM | POA: Insufficient documentation

## 2020-05-24 DIAGNOSIS — Z7902 Long term (current) use of antithrombotics/antiplatelets: Secondary | ICD-10-CM

## 2020-05-24 DIAGNOSIS — D500 Iron deficiency anemia secondary to blood loss (chronic): Secondary | ICD-10-CM | POA: Diagnosis present

## 2020-05-24 DIAGNOSIS — L24A9 Irritant contact dermatitis due friction or contact with other specified body fluids: Secondary | ICD-10-CM | POA: Diagnosis present

## 2020-05-24 LAB — GLUCOSE, CAPILLARY
Glucose-Capillary: 149 mg/dL — ABNORMAL HIGH (ref 70–99)
Glucose-Capillary: 139 mg/dL — ABNORMAL HIGH (ref 70–99)

## 2020-05-24 LAB — CBC WITH DIFFERENTIAL/PLATELET
Abs Immature Granulocytes: 0.11 10*3/uL — ABNORMAL HIGH (ref 0.00–0.07)
Basophils Absolute: 0 10*3/uL (ref 0.0–0.1)
Basophils Relative: 0 %
Eosinophils Absolute: 0.2 10*3/uL (ref 0.0–0.5)
Eosinophils Relative: 2 %
HCT: 26 % — ABNORMAL LOW (ref 36.0–46.0)
Hemoglobin: 8.1 g/dL — ABNORMAL LOW (ref 12.0–15.0)
Immature Granulocytes: 1 %
Lymphocytes Relative: 9 %
Lymphs Abs: 1.2 10*3/uL (ref 0.7–4.0)
MCH: 26.3 pg (ref 26.0–34.0)
MCHC: 31.2 g/dL (ref 30.0–36.0)
MCV: 84.4 fL (ref 80.0–100.0)
Monocytes Absolute: 1.3 10*3/uL — ABNORMAL HIGH (ref 0.1–1.0)
Monocytes Relative: 10 %
Neutro Abs: 10.3 10*3/uL — ABNORMAL HIGH (ref 1.7–7.7)
Neutrophils Relative %: 78 %
Platelets: 272 10*3/uL (ref 150–400)
RBC: 3.08 MIL/uL — ABNORMAL LOW (ref 3.87–5.11)
RDW: 16 % — ABNORMAL HIGH (ref 11.5–15.5)
WBC: 13.1 10*3/uL — ABNORMAL HIGH (ref 4.0–10.5)
nRBC: 0 % (ref 0.0–0.2)

## 2020-05-24 LAB — BASIC METABOLIC PANEL
Anion gap: 9 (ref 5–15)
BUN: 12 mg/dL (ref 8–23)
CO2: 22 mmol/L (ref 22–32)
Calcium: 9 mg/dL (ref 8.9–10.3)
Chloride: 99 mmol/L (ref 98–111)
Creatinine, Ser: 1.03 mg/dL — ABNORMAL HIGH (ref 0.44–1.00)
GFR, Estimated: 60 mL/min — ABNORMAL LOW (ref >60)
Glucose, Bld: 160 mg/dL — ABNORMAL HIGH (ref 70–99)
Potassium: 3.6 mmol/L (ref 3.5–5.1)
Sodium: 130 mmol/L — ABNORMAL LOW (ref 135–145)

## 2020-05-24 LAB — HEMOGLOBIN A1C
Hgb A1c MFr Bld: 6.4 % — ABNORMAL HIGH (ref 4.8–5.6)
Mean Plasma Glucose: 136.98 mg/dL

## 2020-05-24 LAB — C-REACTIVE PROTEIN: CRP: 42.5 mg/dL — ABNORMAL HIGH (ref <1.0)

## 2020-05-24 LAB — SEDIMENTATION RATE: Sed Rate: 95 mm/hr — ABNORMAL HIGH (ref 0–22)

## 2020-05-24 MED ORDER — GABAPENTIN 300 MG PO CAPS
300.0000 mg | ORAL_CAPSULE | ORAL | Status: DC
  Administered 2020-05-26 – 2020-06-01 (×13): 300 mg via ORAL
  Filled 2020-05-24 (×12): qty 1

## 2020-05-24 MED ORDER — METFORMIN HCL 500 MG PO TABS
500.0000 mg | ORAL_TABLET | Freq: Two times a day (BID) | ORAL | Status: DC
  Administered 2020-05-26 – 2020-06-01 (×13): 500 mg via ORAL
  Filled 2020-05-24 (×13): qty 1

## 2020-05-24 MED ORDER — CEFAZOLIN SODIUM-DEXTROSE 2-4 GM/100ML-% IV SOLN
2.0000 g | INTRAVENOUS | Status: AC
  Administered 2020-05-25: 2 g via INTRAVENOUS
  Filled 2020-05-24: qty 100

## 2020-05-24 MED ORDER — FERROUS SULFATE 325 (65 FE) MG PO TABS
325.0000 mg | ORAL_TABLET | Freq: Every day | ORAL | Status: DC
  Administered 2020-05-25 – 2020-05-30 (×5): 325 mg via ORAL
  Filled 2020-05-24 (×8): qty 1

## 2020-05-24 MED ORDER — CHLORHEXIDINE GLUCONATE CLOTH 2 % EX PADS: 6.0000 | MEDICATED_PAD | Freq: Once | CUTANEOUS | Status: DC

## 2020-05-24 MED ORDER — ALLOPURINOL 100 MG PO TABS
300.0000 mg | ORAL_TABLET | Freq: Every day | ORAL | Status: DC
  Administered 2020-05-26 – 2020-06-01 (×7): 300 mg via ORAL
  Filled 2020-05-24 (×8): qty 3

## 2020-05-24 MED ORDER — ACETAMINOPHEN 325 MG PO TABS
650.0000 mg | ORAL_TABLET | ORAL | Status: DC | PRN
  Filled 2020-05-24: qty 2

## 2020-05-24 MED ORDER — EZETIMIBE 10 MG PO TABS
10.0000 mg | ORAL_TABLET | Freq: Every day | ORAL | Status: DC
  Administered 2020-05-26 – 2020-06-01 (×7): 10 mg via ORAL
  Filled 2020-05-24 (×8): qty 1

## 2020-05-24 MED ORDER — OXYCODONE-ACETAMINOPHEN 10-325 MG PO TABS: 1.0000 | ORAL_TABLET | ORAL | Status: DC | PRN

## 2020-05-24 MED ORDER — OXYCODONE HCL 5 MG PO TABS
5.0000 mg | ORAL_TABLET | ORAL | Status: DC | PRN
  Administered 2020-05-24: 5 mg via ORAL
  Filled 2020-05-24: qty 1

## 2020-05-24 MED ORDER — CARVEDILOL PHOSPHATE ER 20 MG PO CP24
20.0000 mg | ORAL_CAPSULE | Freq: Every day | ORAL | Status: DC
  Administered 2020-05-25 – 2020-06-01 (×8): 20 mg via ORAL
  Filled 2020-05-24 (×9): qty 1

## 2020-05-24 MED ORDER — SENNA 8.6 MG PO TABS
1.0000 | ORAL_TABLET | Freq: Two times a day (BID) | ORAL | Status: DC
  Administered 2020-05-24: 8.6 mg via ORAL
  Filled 2020-05-24: qty 1

## 2020-05-24 MED ORDER — ASPIRIN EC 81 MG PO TBEC
81.0000 mg | DELAYED_RELEASE_TABLET | Freq: Every day | ORAL | Status: DC
  Administered 2020-05-26 – 2020-06-01 (×7): 81 mg via ORAL
  Filled 2020-05-24 (×8): qty 1

## 2020-05-24 MED ORDER — AMLODIPINE BESYLATE 10 MG PO TABS
10.0000 mg | ORAL_TABLET | Freq: Every day | ORAL | Status: DC
  Administered 2020-05-25 – 2020-06-01 (×7): 10 mg via ORAL
  Filled 2020-05-24 (×10): qty 1

## 2020-05-24 MED ORDER — GABAPENTIN 300 MG PO CAPS: 300.0000 mg | ORAL_CAPSULE | ORAL | Status: DC

## 2020-05-24 MED ORDER — SODIUM CHLORIDE 0.9 % IV SOLN: INTRAVENOUS | Status: DC

## 2020-05-24 MED ORDER — OXYCODONE-ACETAMINOPHEN 5-325 MG PO TABS
1.0000 | ORAL_TABLET | ORAL | Status: DC | PRN
  Administered 2020-05-24: 1 via ORAL
  Filled 2020-05-24: qty 1

## 2020-05-24 MED ORDER — ACETAMINOPHEN 650 MG RE SUPP
650.0000 mg | RECTAL | Status: DC | PRN
  Administered 2020-05-25: 650 mg via RECTAL
  Filled 2020-05-24: qty 1

## 2020-05-24 MED ORDER — DIPHENHYDRAMINE HCL 25 MG PO CAPS
25.0000 mg | ORAL_CAPSULE | Freq: Four times a day (QID) | ORAL | Status: DC | PRN
  Administered 2020-05-26 – 2020-05-30 (×8): 25 mg via ORAL
  Filled 2020-05-24 (×9): qty 1

## 2020-05-24 MED ORDER — ACETAMINOPHEN 500 MG PO TABS
1000.0000 mg | ORAL_TABLET | ORAL | Status: AC
  Administered 2020-05-25: 1000 mg via ORAL
  Filled 2020-05-24: qty 2

## 2020-05-24 MED ORDER — GABAPENTIN 300 MG PO CAPS
600.0000 mg | ORAL_CAPSULE | Freq: Every day | ORAL | Status: DC
  Administered 2020-05-24 – 2020-05-31 (×8): 600 mg via ORAL
  Filled 2020-05-24 (×9): qty 2

## 2020-05-24 MED ORDER — ONDANSETRON HCL 4 MG PO TABS: 4.0000 mg | ORAL_TABLET | Freq: Four times a day (QID) | ORAL | Status: DC | PRN

## 2020-05-24 MED ORDER — ONDANSETRON HCL 4 MG/2ML IJ SOLN: 4.0000 mg | Freq: Four times a day (QID) | INTRAMUSCULAR | Status: DC | PRN

## 2020-05-24 MED ORDER — MUPIROCIN 2 % EX OINT
1.0000 "application " | TOPICAL_OINTMENT | Freq: Two times a day (BID) | CUTANEOUS | Status: AC
  Administered 2020-05-25 – 2020-05-29 (×9): 1 via NASAL
  Filled 2020-05-24: qty 22

## 2020-05-24 MED ORDER — INSULIN ASPART 100 UNIT/ML ~~LOC~~ SOLN
0.0000 [IU] | Freq: Three times a day (TID) | SUBCUTANEOUS | Status: DC
  Administered 2020-05-25 (×2): 2 [IU] via SUBCUTANEOUS
  Administered 2020-05-26: 3 [IU] via SUBCUTANEOUS
  Administered 2020-05-27: 2 [IU] via SUBCUTANEOUS
  Administered 2020-05-29: 3 [IU] via SUBCUTANEOUS

## 2020-05-24 MED ORDER — CHLORHEXIDINE GLUCONATE CLOTH 2 % EX PADS
6.0000 | MEDICATED_PAD | Freq: Once | CUTANEOUS | Status: AC
  Administered 2020-05-24: 6 via TOPICAL

## 2020-05-24 NOTE — H&P (Signed)
Subjective: Patient is a 70 y.o. female admitted for Lumbar wound infection. Onset of symptoms was a few days ago, gradually worsening since that time.  The pain is rated moderate, and is located at the across the lower back. The pain is described as aching and occurs all day. The symptoms have been progressive. Symptoms are exacerbated by exercise.  Plain films showed the hardware and grafts to be in good position.  Overall she just feels bad and does not want to eat very much.  Her urine is concentrated.  She states her hemoglobin has dropped from 8.7-8.2.  She was started on iron by her primary care physician.   Past Medical History:  Diagnosis Date  . Arthritis   . Carotid artery narrowing   . Coronary artery disease    Cardiac catheterization November 2013: 50% ostial LAD stenosis 50% mid stenosis. 30% disease in the left circumflex.  . Diabetes mellitus, type 2 ([place])   . Hyperlipidemia   . Hypertension   . Onychomycosis of toenail 07/31/2016  . Sleep apnea       Had surgery to correct    Past Surgical History:  Procedure Laterality Date  . ABDOMINAL HYSTERECTOMY    . CARDIAC CATHETERIZATION    . CATARACT EXTRACTION, BILATERAL Bilateral 2021   Dr. Sherral Hammers  . ENDARTERECTOMY  09/27/2011   Procedure: RIGT ENDARTERECTOMY CAROTID;  Surgeon: Mal Misty, MD;  Location: [place];  Service: Vascular;  Laterality: Right;  . EPIDURAL BLOCK INJECTION  02/2008   Drs. Eulis Manly  . gyn surgery  2004   total hysterectomy for mennorhagia,,salpingoophorectomy  . POSTERIOR LUMBAR FUSION 4 LEVEL N/A 04/25/2020   Procedure: POSTERIOR LUMBAR INTERBODY FUSION LUMBAR ONE-TWO, LUMBAR TWO-THREE, LUMBAR THREE-FOUR,LUMBAR FOUR-FIVE.;  Surgeon: Eustace Moore, MD;  Location: [place];  Service: Neurosurgery;  Laterality: N/A;  posterior  . TONSILLECTOMY    . TOTAL ABDOMINAL HYSTERECTOMY W/ BILATERAL SALPINGOOPHORECTOMY Bilateral 2000  . TOTAL HIP ARTHROPLASTY Right 08/22/2018   Procedure: TOTAL HIP  ARTHROPLASTY;  Surgeon: Earlie Server, MD;  Location: [facility];  Service: Orthopedics;  Laterality: Right;  Marland Kitchen VARICOSE VEIN SURGERY  2008   stripping    Prior to Admission medications   Medication Sig Start Date End Date Taking? Authorizing Provider  acetaminophen (TYLENOL) 650 MG CR tablet Take 1,300 mg by mouth daily as needed for pain.    [provider]  allopurinol (ZYLOPRIM) 300 MG tablet Take 1 tablet (300 mg total) by mouth daily. 07/09/19   Mack Hook, MD  amLODipine (NORVASC) 10 MG tablet Take 1 tablet (10 mg total) by mouth daily. 07/09/19   Mack Hook, MD  aspirin EC 81 MG tablet Take 81 mg by mouth daily.    [provider]  atorvastatin (LIPITOR) 80 MG tablet TAKE 1 TABLET BY MOUTH ONCE DAILY WITH  EVENING  MEAL Patient taking differently: Take 80 mg by mouth daily. 07/09/19   Mack Hook, MD  blood glucose meter kit and supplies KIT Check sugars twice daily 06/10/18   Mack Hook, MD  carvedilol (COREG CR) 20 MG 24 hr capsule Take 1 capsule (20 mg total) by mouth daily. 07/09/19   Mack Hook, MD  clopidogrel (PLAVIX) 75 MG tablet Take 75 mg by mouth daily.    [provider]  diphenhydrAMINE (BENADRYL) 25 MG tablet Take 25 mg by mouth every 6 (six) hours as needed for itching or allergies.    [provider]  ezetimibe (ZETIA) 10 MG tablet TAKE 1 TABLET  EVERY DAY (NEED MD APPOINTMENT) Patient taking differently: Take 10 mg by mouth daily. 12/11/19   Lorretta Harp, MD  ferrous sulfate 325 (65 FE) MG tablet Take 325 mg by mouth daily with breakfast.    [provider]  gabapentin (NEURONTIN) 300 MG capsule 1 cap by mouth in morning, 1 cap by mouth midday and 2 caps by mouth before bedtime. Patient taking differently: Take 300-600 mg by mouth See admin instructions. Take 1 capsule (300 mg) by mouth in the morning & take 2 capsules (600 mg) by mouth at bedtime 06/10/19   Mack Hook, MD   glucose blood test strip Check sugars twice daily 01/31/16   Mack Hook, MD  metFORMIN (GLUCOPHAGE) 500 MG tablet Take 500 mg by mouth 2 (two) times daily with a meal. 04/08/20   [provider]  methocarbamol (ROBAXIN) 500 MG tablet Take 1 tablet (500 mg total) by mouth 4 (four) times daily. Patient taking differently: Take 500 mg by mouth daily. 04/27/20   Meyran, Ocie Cornfield, NP  MITIGARE 0.6 MG CAPS TAKE 1 CAPSULE TWICE DAILY AS NEEDED FOR GOUT FLARE AND HOLD ATORVASTATIN ON DAYS TAKING [place] Patient taking differently: Take 0.6 mg by mouth 2 (two) times daily as needed (gout flares). 12/31/19   Mack Hook, MD  oxyCODONE-acetaminophen (PERCOCET) 10-325 MG tablet Take 1 tablet by mouth every 6 (six) hours as needed for pain. 05/02/20   [provider]   Allergies  Allergen Reactions  . Lisinopril-Hydrochlorothiazide Other (See Comments)    Angioedema  - Tongue swelling   . Codeine Itching  . Oxycodone Itching  . Simvastatin Other (See Comments)    Muscle pain  . Tramadol Itching    Social History   Tobacco Use  . Smoking status: Current Every Day Smoker    Packs/day: 0.10    Years: 25.00    Pack years: 2.50    Types: Cigarettes    Start date: 05/31/1968  . Smokeless tobacco: Never Used  . Tobacco comment: pt states she smokes about 4-5 cigs per day--states she contiues to work on it.  No interest in other help now.  Substance Use Topics  . Alcohol use: Yes    Comment: rare    Family History  Problem Relation Age of Onset  . Hypertension Sister   . Hypothyroidism Sister   . Cancer Maternal Grandmother        lung  . Hypertension Son   . Hypertension Sister   . Diabetes Brother        lost toe in 2018 with new diagnosis of DM  . Hypertension Son      Review of Systems  Positive ROS:   As above  All other systems have been reviewed and were otherwise negative with the exception of those mentioned in the HPI and as  above.  Objective: Vital signs in last 24 hours:    General Appearance: Alert, cooperative, no distress, appears stated age Head: Normocephalic, without obvious abnormality, atraumatic Eyes: PERRL, conjunctiva/corneas clear, EOM's intact    Neck: Supple, symmetrical, trachea midline Back: Symmetric, no curvature, ROM normal, no CVA tenderness Lungs:  respirations unlabored Heart: Regular rate and rhythm Abdomen: Soft, non-tender Extremities: Extremities normal, atraumatic, no cyanosis or edema Pulses: 2+ and symmetric all extremities Skin: Skin color, texture, turgor normal, no rashes or lesions  NEUROLOGIC:   Mental status: Alert and oriented x4,  no aphasia, good attention span, fund of knowledge, and memory Motor Exam - grossly normal Sensory  Exam - grossly normal Reflexes:  trace Coordination - grossly normal Gait - grossly normal Balance - grossly normal Cranial Nerves: I: smell Not tested  II: visual acuity  OS: nl    OD: nl  II: visual fields Full to confrontation  II: pupils Equal, round, reactive to light  III,VII: ptosis None  III,IV,VI: extraocular muscles  Full ROM  V: mastication Normal  V: facial light touch sensation  Normal  V,VII: corneal reflex  Present  VII: facial muscle function - upper  Normal  VII: facial muscle function - lower Normal  VIII: hearing Not tested  IX: soft palate elevation  Normal  IX,X: gag reflex Present  XI: trapezius strength  5/5  XI: sternocleidomastoid strength 5/5  XI: neck flexion strength  5/5  XII: tongue strength  Normal    Data Review Lab Results  Component Value Date   WBC 7.6 04/21/2020   HGB 12.6 04/21/2020   HCT 37.3 04/21/2020   MCV 90.5 04/21/2020   PLT 258 04/21/2020   Lab Results  Component Value Date   NA 137 04/21/2020   K 3.2 (L) 04/21/2020   CL 102 04/21/2020   CO2 25 04/21/2020   BUN 8 04/21/2020   CREATININE 0.80 04/21/2020   GLUCOSE 102 (H) 04/21/2020   Lab Results  Component Value  Date   INR 1.0 04/21/2020    Assessment/Plan:  Estimated body mass index is 29.23 kg/m as calculated from the following:   Height as of 04/25/20: '5\' 3"'  (1.6 m).   Weight as of 04/25/20: 74.8 kg. Patient admitted for  Suspected lumbar wound infection.  Drainage started yesterday.  We drained the wound in cleaned up in the office..  We admit her for "tune-up" for surgery.  We would like to check some labs and get her hydrated.    We plan on irrigation and debridement of lumbar wound tomorrow.  This was all discussed in the office with the patient.  I explained the condition and procedure to the patient and answered any questions.  Patient wishes to proceed with procedure as planned. Understands risks/ benefits and typical outcomes of procedure.   Eustace Moore 05/24/2020 5:22 PM

## 2020-05-24 NOTE — Progress Notes (Signed)
Pt admitted and settled comfortably. Belongings in room. Vitals taken. MD Yetta Barre contacted via office secretary, orders requested. Will continue to monitor.

## 2020-05-24 NOTE — Progress Notes (Signed)
Pt states she took plavix at about 1230 today. Will page MD and continue to monitor.

## 2020-05-24 NOTE — Progress Notes (Signed)
No orders for pt submitted yet by MD, pt states pain in lower body is 8/10. RN paged Doran Durand MD through [facility] requesting orders, esp. For pain and diet.

## 2020-05-25 ENCOUNTER — Inpatient Hospital Stay (HOSPITAL_COMMUNITY): Payer: Medicare HMO | Admitting: Anesthesiology

## 2020-05-25 ENCOUNTER — Inpatient Hospital Stay (HOSPITAL_COMMUNITY): Admission: RE | Admit: 2020-05-25 | Payer: Medicare HMO | Source: Home / Self Care | Admitting: Neurological Surgery

## 2020-05-25 ENCOUNTER — Encounter (HOSPITAL_COMMUNITY)
Admission: AD | Disposition: A | Discharge status: Home Health | Payer: Self-pay | Source: Ambulatory Visit | Attending: Neurological Surgery

## 2020-05-25 ENCOUNTER — Encounter (HOSPITAL_COMMUNITY): Payer: Self-pay | Admitting: Neurological Surgery

## 2020-05-25 DIAGNOSIS — T8149XA Infection following a procedure, other surgical site, initial encounter: Secondary | ICD-10-CM | POA: Diagnosis present

## 2020-05-25 HISTORY — PX: LUMBAR WOUND DEBRIDEMENT: SHX1988

## 2020-05-25 LAB — GLUCOSE, CAPILLARY
Glucose-Capillary: 126 mg/dL — ABNORMAL HIGH (ref 70–99)
Glucose-Capillary: 125 mg/dL — ABNORMAL HIGH (ref 70–99)
Glucose-Capillary: 108 mg/dL — ABNORMAL HIGH (ref 70–99)
Glucose-Capillary: 115 mg/dL — ABNORMAL HIGH (ref 70–99)
Glucose-Capillary: 135 mg/dL — ABNORMAL HIGH (ref 70–99)

## 2020-05-25 LAB — SURGICAL PCR SCREEN
MRSA, PCR: POSITIVE — AB
Staphylococcus aureus: POSITIVE — AB

## 2020-05-25 LAB — SARS CORONAVIRUS 2 (TAT 6-24 HRS): SARS Coronavirus 2: NEGATIVE

## 2020-05-25 SURGERY — LUMBAR WOUND DEBRIDEMENT
Anesthesia: General | Site: Spine Lumbar

## 2020-05-25 MED ORDER — POTASSIUM CHLORIDE IN NACL 20-0.9 MEQ/L-% IV SOLN
INTRAVENOUS | Status: DC
  Filled 2020-05-25 (×2): qty 1000

## 2020-05-25 MED ORDER — SENNA 8.6 MG PO TABS
1.0000 | ORAL_TABLET | Freq: Two times a day (BID) | ORAL | Status: DC
  Administered 2020-05-25 – 2020-06-01 (×7): 8.6 mg via ORAL
  Filled 2020-05-25 (×12): qty 1

## 2020-05-25 MED ORDER — ONDANSETRON HCL 4 MG PO TABS: 4.0000 mg | ORAL_TABLET | Freq: Four times a day (QID) | ORAL | Status: DC | PRN

## 2020-05-25 MED ORDER — SODIUM CHLORIDE 0.9% FLUSH
3.0000 mL | Freq: Two times a day (BID) | INTRAVENOUS | Status: DC
  Administered 2020-05-26 – 2020-06-01 (×13): 3 mL via INTRAVENOUS

## 2020-05-25 MED ORDER — MIDAZOLAM HCL 2 MG/2ML IJ SOLN
INTRAMUSCULAR | Status: AC
  Filled 2020-05-25: qty 2

## 2020-05-25 MED ORDER — CHLORHEXIDINE GLUCONATE 0.12 % MT SOLN: 15.0000 mL | Freq: Once | OROMUCOSAL | Status: AC

## 2020-05-25 MED ORDER — VANCOMYCIN HCL 1000 MG IV SOLR
INTRAVENOUS | Status: AC
  Filled 2020-05-25: qty 1000

## 2020-05-25 MED ORDER — DROPERIDOL 2.5 MG/ML IJ SOLN: 0.6250 mg | Freq: Once | INTRAMUSCULAR | Status: DC | PRN

## 2020-05-25 MED ORDER — ACETAMINOPHEN 650 MG RE SUPP: 650.0000 mg | RECTAL | Status: DC | PRN

## 2020-05-25 MED ORDER — ROCURONIUM BROMIDE 100 MG/10ML IV SOLN
INTRAVENOUS | Status: DC | PRN
  Administered 2020-05-25: 50 mg via INTRAVENOUS

## 2020-05-25 MED ORDER — VANCOMYCIN HCL 1000 MG/200ML IV SOLN
1000.0000 mg | INTRAVENOUS | Status: AC
  Administered 2020-05-25: 1000 mg via INTRAVENOUS
  Filled 2020-05-25: qty 200

## 2020-05-25 MED ORDER — LIDOCAINE 2% (20 MG/ML) 5 ML SYRINGE
INTRAMUSCULAR | Status: AC
  Filled 2020-05-25: qty 5

## 2020-05-25 MED ORDER — MENTHOL 3 MG MT LOZG: 1.0000 | LOZENGE | OROMUCOSAL | Status: DC | PRN

## 2020-05-25 MED ORDER — MORPHINE SULFATE (PF) 2 MG/ML IV SOLN
2.0000 mg | INTRAVENOUS | Status: DC | PRN
Start: 2020-05-25 — End: 2020-06-01
  Administered 2020-05-25 – 2020-05-27 (×3): 2 mg via INTRAVENOUS
  Filled 2020-05-25 (×4): qty 1

## 2020-05-25 MED ORDER — ACETAMINOPHEN 325 MG PO TABS
650.0000 mg | ORAL_TABLET | ORAL | Status: DC | PRN
  Administered 2020-05-26 – 2020-05-31 (×3): 650 mg via ORAL
  Filled 2020-05-25 (×4): qty 2

## 2020-05-25 MED ORDER — ONDANSETRON HCL 4 MG/2ML IJ SOLN
INTRAMUSCULAR | Status: AC
  Filled 2020-05-25: qty 2

## 2020-05-25 MED ORDER — OXYCODONE HCL 5 MG PO TABS
5.0000 mg | ORAL_TABLET | ORAL | Status: DC | PRN
  Administered 2020-05-26 – 2020-05-30 (×14): 5 mg via ORAL
  Filled 2020-05-25 (×14): qty 1

## 2020-05-25 MED ORDER — 0.9 % SODIUM CHLORIDE (POUR BTL) OPTIME
TOPICAL | Status: DC | PRN
  Administered 2020-05-25 (×2): 1000 mL

## 2020-05-25 MED ORDER — SODIUM CHLORIDE 0.9 % IV SOLN
1.0000 g | INTRAVENOUS | Status: DC
  Administered 2020-05-25: 1 g via INTRAVENOUS
  Filled 2020-05-25: qty 10

## 2020-05-25 MED ORDER — PHENYLEPHRINE 40 MCG/ML (10ML) SYRINGE FOR IV PUSH (FOR BLOOD PRESSURE SUPPORT)
PREFILLED_SYRINGE | INTRAVENOUS | Status: DC | PRN
  Administered 2020-05-25: 80 ug via INTRAVENOUS

## 2020-05-25 MED ORDER — FENTANYL CITRATE (PF) 100 MCG/2ML IJ SOLN
INTRAMUSCULAR | Status: DC | PRN
  Administered 2020-05-25: 100 ug via INTRAVENOUS

## 2020-05-25 MED ORDER — FENTANYL CITRATE (PF) 100 MCG/2ML IJ SOLN
INTRAMUSCULAR | Status: AC
  Filled 2020-05-25: qty 2

## 2020-05-25 MED ORDER — VANCOMYCIN HCL 750 MG/150ML IV SOLN
750.0000 mg | Freq: Two times a day (BID) | INTRAVENOUS | Status: DC
  Administered 2020-05-25 – 2020-05-29 (×8): 750 mg via INTRAVENOUS
  Filled 2020-05-25 (×8): qty 150

## 2020-05-25 MED ORDER — MIDAZOLAM HCL 5 MG/5ML IJ SOLN
INTRAMUSCULAR | Status: DC | PRN
  Administered 2020-05-25: 2 mg via INTRAVENOUS

## 2020-05-25 MED ORDER — SUGAMMADEX SODIUM 200 MG/2ML IV SOLN
INTRAVENOUS | Status: DC | PRN
  Administered 2020-05-25: 200 mg via INTRAVENOUS

## 2020-05-25 MED ORDER — PHENOL 1.4 % MT LIQD: 1.0000 | OROMUCOSAL | Status: DC | PRN

## 2020-05-25 MED ORDER — LACTATED RINGERS IV SOLN: INTRAVENOUS | Status: DC

## 2020-05-25 MED ORDER — RIFAMPIN 300 MG PO CAPS
600.0000 mg | ORAL_CAPSULE | Freq: Every day | ORAL | Status: DC
  Administered 2020-05-25 – 2020-06-01 (×8): 600 mg via ORAL
  Filled 2020-05-25 (×8): qty 2

## 2020-05-25 MED ORDER — ORAL CARE MOUTH RINSE: 15.0000 mL | Freq: Once | OROMUCOSAL | Status: AC

## 2020-05-25 MED ORDER — MORPHINE SULFATE (PF) 2 MG/ML IV SOLN
2.0000 mg | INTRAVENOUS | Status: DC | PRN
  Administered 2020-05-25: 2 mg via INTRAVENOUS
  Filled 2020-05-25 (×2): qty 1

## 2020-05-25 MED ORDER — CHLORHEXIDINE GLUCONATE 0.12 % MT SOLN
OROMUCOSAL | Status: AC
  Administered 2020-05-25: 15 mL via OROMUCOSAL
  Filled 2020-05-25: qty 15

## 2020-05-25 MED ORDER — PROPOFOL 10 MG/ML IV BOLUS
INTRAVENOUS | Status: DC | PRN
  Administered 2020-05-25: 100 mg via INTRAVENOUS

## 2020-05-25 MED ORDER — HYDROMORPHONE HCL 1 MG/ML IJ SOLN
0.2500 mg | INTRAMUSCULAR | Status: DC | PRN
Start: 2020-05-25 — End: 2020-05-25
  Administered 2020-05-25 (×4): 0.25 mg via INTRAVENOUS

## 2020-05-25 MED ORDER — ONDANSETRON HCL 4 MG/2ML IJ SOLN
INTRAMUSCULAR | Status: DC | PRN
  Administered 2020-05-25: 4 mg via INTRAVENOUS

## 2020-05-25 MED ORDER — SODIUM CHLORIDE 0.9 % IV SOLN: 250.0000 mL | INTRAVENOUS | Status: DC

## 2020-05-25 MED ORDER — VANCOMYCIN HCL 1000 MG IV SOLR
INTRAVENOUS | Status: DC | PRN
  Administered 2020-05-25: 1000 mg via TOPICAL

## 2020-05-25 MED ORDER — HYDROMORPHONE HCL 1 MG/ML IJ SOLN
INTRAMUSCULAR | Status: AC
  Filled 2020-05-25: qty 1

## 2020-05-25 MED ORDER — FENTANYL CITRATE (PF) 250 MCG/5ML IJ SOLN
INTRAMUSCULAR | Status: AC
  Filled 2020-05-25: qty 5

## 2020-05-25 MED ORDER — CEFAZOLIN SODIUM-DEXTROSE 2-3 GM-%(50ML) IV SOLR
INTRAVENOUS | Status: DC | PRN
  Administered 2020-05-25: 2 g via INTRAVENOUS

## 2020-05-25 MED ORDER — ONDANSETRON HCL 4 MG/2ML IJ SOLN
4.0000 mg | Freq: Four times a day (QID) | INTRAMUSCULAR | Status: DC | PRN
  Administered 2020-05-31 – 2020-06-01 (×2): 4 mg via INTRAVENOUS
  Filled 2020-05-25 (×2): qty 2

## 2020-05-25 MED ORDER — ARTIFICIAL TEARS OPHTHALMIC OINT
TOPICAL_OINTMENT | OPHTHALMIC | Status: AC
  Filled 2020-05-25: qty 3.5

## 2020-05-25 MED ORDER — SODIUM CHLORIDE 0.9% FLUSH: 3.0000 mL | INTRAVENOUS | Status: DC | PRN

## 2020-05-25 MED ORDER — LIDOCAINE 2% (20 MG/ML) 5 ML SYRINGE
INTRAMUSCULAR | Status: DC | PRN
  Administered 2020-05-25: 100 mg via INTRAVENOUS

## 2020-05-25 MED ORDER — FENTANYL CITRATE (PF) 100 MCG/2ML IJ SOLN
25.0000 ug | INTRAMUSCULAR | Status: DC | PRN
Start: 2020-05-25 — End: 2020-05-25
  Administered 2020-05-25: 25 ug via INTRAVENOUS

## 2020-05-25 SURGICAL SUPPLY — 45 items
ADH SKN CLS APL DERMABOND .7 (GAUZE/BANDAGES/DRESSINGS) ×1
APL SKNCLS STERI-STRIP NONHPOA (GAUZE/BANDAGES/DRESSINGS) ×1
BENZOIN TINCTURE PRP APPL 2/3 (GAUZE/BANDAGES/DRESSINGS) ×2 IMPLANT
CANISTER SUCT 3000ML PPV (MISCELLANEOUS) ×2 IMPLANT
COVER WAND RF STERILE (DRAPES) ×2 IMPLANT
DERMABOND ADVANCED (GAUZE/BANDAGES/DRESSINGS) ×1
DERMABOND ADVANCED .7 DNX12 (GAUZE/BANDAGES/DRESSINGS) IMPLANT
DIFFUSER DRILL AIR PNEUMATIC (MISCELLANEOUS) ×2 IMPLANT
DRAPE LAPAROTOMY 100X72X124 (DRAPES) ×2 IMPLANT
DRSG OPSITE POSTOP 4X12 (GAUZE/BANDAGES/DRESSINGS) ×1 IMPLANT
DURAPREP 26ML APPLICATOR (WOUND CARE) IMPLANT
DURAPREP 6ML APPLICATOR 50/CS (WOUND CARE) IMPLANT
ELECT REM PT RETURN 9FT ADLT (ELECTROSURGICAL) ×2
ELECTRODE REM PT RTRN 9FT ADLT (ELECTROSURGICAL) ×1 IMPLANT
GAUZE 4X4 16PLY RFD (DISPOSABLE) IMPLANT
GLOVE BIO SURGEON STRL SZ7 (GLOVE) IMPLANT
GLOVE BIO SURGEON STRL SZ8 (GLOVE) ×2 IMPLANT
GLOVE SURG UNDER POLY LF SZ7 (GLOVE) IMPLANT
GOWN STRL REUS W/ TWL LRG LVL3 (GOWN DISPOSABLE) IMPLANT
GOWN STRL REUS W/ TWL XL LVL3 (GOWN DISPOSABLE) IMPLANT
GOWN STRL REUS W/TWL 2XL LVL3 (GOWN DISPOSABLE) ×2 IMPLANT
GOWN STRL REUS W/TWL LRG LVL3 (GOWN DISPOSABLE)
GOWN STRL REUS W/TWL XL LVL3 (GOWN DISPOSABLE)
KIT BASIN OR (CUSTOM PROCEDURE TRAY) ×2 IMPLANT
KIT TURNOVER KIT B (KITS) ×2 IMPLANT
NDL HYPO 18GX1.5 BLUNT FILL (NEEDLE) IMPLANT
NDL HYPO 25X1 1.5 SAFETY (NEEDLE) ×1 IMPLANT
NDL SPNL 20GX3.5 QUINCKE YW (NEEDLE) IMPLANT
NEEDLE HYPO 18GX1.5 BLUNT FILL (NEEDLE) IMPLANT
NEEDLE HYPO 25X1 1.5 SAFETY (NEEDLE) ×2 IMPLANT
NEEDLE SPNL 20GX3.5 QUINCKE YW (NEEDLE) IMPLANT
NS IRRIG 1000ML POUR BTL (IV SOLUTION) ×2 IMPLANT
PACK LAMINECTOMY NEURO (CUSTOM PROCEDURE TRAY) ×2 IMPLANT
PAD ARMBOARD 7.5X6 YLW CONV (MISCELLANEOUS) ×6 IMPLANT
STRIP CLOSURE SKIN 1/2X4 (GAUZE/BANDAGES/DRESSINGS) ×2 IMPLANT
SUT VIC AB 0 CT1 18XCR BRD8 (SUTURE) ×1 IMPLANT
SUT VIC AB 0 CT1 8-18 (SUTURE) ×2
SUT VIC AB 2-0 CP2 18 (SUTURE) ×2 IMPLANT
SUT VIC AB 3-0 SH 8-18 (SUTURE) ×2 IMPLANT
SWAB COLLECTION DEVICE MRSA (MISCELLANEOUS) ×2 IMPLANT
SWAB CULTURE ESWAB REG 1ML (MISCELLANEOUS) ×2 IMPLANT
SYR 3ML LL SCALE MARK (SYRINGE) IMPLANT
TOWEL GREEN STERILE (TOWEL DISPOSABLE) ×2 IMPLANT
TOWEL GREEN STERILE FF (TOWEL DISPOSABLE) ×2 IMPLANT
WATER STERILE IRR 1000ML POUR (IV SOLUTION) ×2 IMPLANT

## 2020-05-25 NOTE — Plan of Care (Signed)

## 2020-05-25 NOTE — Anesthesia Procedure Notes (Addendum)
Procedure Name: Intubation Date/Time: 05/25/2020 4:54 PM Performed by: Alain Marion, CRNA Pre-anesthesia Checklist: Patient identified, Emergency Drugs available, Suction available and Patient being monitored Patient Re-evaluated:Patient Re-evaluated prior to induction Oxygen Delivery Method: Circle system utilized Preoxygenation: Pre-oxygenation with 100% oxygen Induction Type: IV induction Ventilation: Mask ventilation without difficulty Laryngoscope Size: Mac and 3 Grade View: Grade I Tube type: Oral Tube size: 7.0 mm Number of attempts: 1 Airway Equipment and Method: Stylet and Oral airway Placement Confirmation: ETT inserted through vocal cords under direct vision,  positive ETCO2 and breath sounds checked- equal and bilateral Tube secured with: Tape Dental Injury: Teeth and Oropharynx as per pre-operative assessment  Comments: Intubated by Rayburn Ma

## 2020-05-25 NOTE — Transfer of Care (Signed)
Immediate Anesthesia Transfer of Care Note  Patient: GRACEMARIE SKEET  Procedure(s) Performed: LUMBAR WOUND IRRIGATION AND DEBRIDEMENT (N/A Spine Lumbar)  Patient Location: PACU  Anesthesia Type:General  Level of Consciousness: awake, alert  and oriented  Airway & Oxygen Therapy: Patient Spontanous Breathing and Patient connected to face mask oxygen  Post-op Assessment: Report given to RN and Post -op Vital signs reviewed and stable  Post vital signs: Reviewed and stable  Last Vitals:  Vitals Value Taken Time  BP 115/61 05/25/20 1832  Temp 36.8 C 05/25/20 1802  Pulse 76 05/25/20 1847  Resp 18 05/25/20 1847  SpO2 90 % 05/25/20 1847  Vitals shown include unvalidated device data.  Last Pain:  Vitals:   05/25/20 1832  TempSrc:   PainSc: 8       Patients Stated Pain Goal: 1 (05/24/20 2318)  Complications: No complications documented.

## 2020-05-25 NOTE — Progress Notes (Signed)
Per MD verbal convesation, RN to hold all oral meds prior to surgery.

## 2020-05-25 NOTE — Op Note (Signed)
05/25/2020  5:54 PM  PATIENT:  April Shaffer  70 y.o. female  PRE-OPERATIVE DIAGNOSIS: Postoperative wound infection  POST-OPERATIVE DIAGNOSIS:  same  PROCEDURE: Irrigation and debridement of lumbar wound, incision 10 cm, involving subfascial deep tissues  SURGEON:  Marikay Alar, MD  ASSISTANTS: Meyran FNP  ANESTHESIA:   General  EBL: 50 ml  Total I/O In: 1000 [I.V.:800; IV Piggyback:200] Out: 50 [Blood:50]  BLOOD ADMINISTERED: none  DRAINS: bilateral med hemovacs  SPECIMEN:  none  INDICATION FOR PROCEDURE: This patient presented with purulent wound drainage.  Recommended irrigation and debridement of lumbar wound. Patient understood the risks, benefits, and alternatives and potential outcomes and wished to proceed.  PROCEDURE DETAILS: The patient was taken to the operating room and after induction of adequate generalized endotracheal anesthesia, the patient was rolled into the prone position on the Wilson frame and all pressure points were padded. The lumbar region was cleaned and then prepped with DuraPrep and draped in the usual sterile fashion. 5 cc of local anesthesia was injected and then a dorsal midline incision was made and carried down to the lumbo sacral fascia.  There was immediate release of exudative fluid and cultures were sent.  The purulence went below the fascia so we open the fascia the entire length of the wound.  All of the purulence was subfascial.  We debrided the tissues bluntly and occasionally sharply in the subfascial space to get healthy tissues.  I irrigated with 2 L of saline solution containing bacitracin. Achieved hemostasis with bipolar cautery, placed bilateral medium Hemovac drains in the epidural space, and then closed the fascia with 0 Vicryl. I closed the subcutaneous tissues with 2-0 Vicryl and the subcuticular tissues with 3-0 Vicryl. The skin was then closed with benzoin and Steri-Strips. The drapes were removed, a sterile dressing was  applied.  My nurse practitioner was involved in the exposure, irrigation and debridement.. the patient was awakened from general anesthesia and transferred to the recovery room in stable condition. At the end of the procedure all sponge, needle and instrument counts were correct.    PLAN OF CARE: Admit to inpatient   PATIENT DISPOSITION:  PACU - hemodynamically stable.   Delay start of Pharmacological VTE agent (>24hrs) due to surgical blood loss or risk of bleeding:  yes

## 2020-05-25 NOTE — Progress Notes (Addendum)
Pharmacy Antibiotic Note  April Shaffer is a 70 y.o. female admitted on 05/24/2020 with post-op wound infection (and purulent wound drainage) involving subfascial deep tissues.  Pt is S/P lumbar fusion on 04/25/20; hardware and grafts in good position. She underwent I&D of lumbar wound infection today. Pharmacy has been consulted for vancomycin dosing for wound infection.  WBC 13.1, Tmax 100.8 F; Scr 1.03, CrCl 50.5 ml/min  Pt rec'd vancomycin 1 gm IV X 1 pre op at 1738 PM today  Height: 63 inches Weight: 72.5 kg  Plan: Vancomycin 750 mg IV Q 12 hrs, per [place] vancomycin dosing protocol (given proximity to CNS, will target vancomycin trough of 15-20 mg/L, with goal closer to 20 mg/L); give first dose ~4 hrs after pre-op dose (estimate vancomycin level from pre-op dose to be ~16-17 mg/L at that time) Monitor WBC, temp, clinical improvement, renal function, cultures  Temp (24hrs), Avg:98.9 F (37.2 C), Min:97.9 F (36.6 C), Max:100.8 F (38.2 C)  Recent Labs  Lab 05/24/20 1748  WBC 13.1*  CREATININE 1.03*    CrCl cannot be calculated (Unknown ideal weight.).    Allergies  Allergen Reactions  . Lisinopril-Hydrochlorothiazide Other (See Comments)    Angioedema  - Tongue swelling   . Codeine Itching  . Oxycodone Itching  . Simvastatin Other (See Comments)    Muscle pain  . Tramadol Itching    Antimicrobials this admission: Cefazolin X 1 pre op on 3/30 Vancomycin 1 gm IV X 1 pre op at 1738 PM today Ceftriaxone 3/30 >> Rifampin PO 3/30 >> Vancomycin 3/30 >>  Microbiology results: 3/29  MRSA PCR: positive 3/29 COVID: negative 3/30 Surgical cx: pending  Thank you for allowing pharmacy to be a part of this patient's care.  Vicki Mallet, PharmD, BCPS, [facility] Clinical Pharmacist 05/25/2020 8:31 PM

## 2020-05-25 NOTE — Anesthesia Preprocedure Evaluation (Addendum)
Anesthesia Evaluation  Patient identified by MRN, date of birth, ID band Patient awake    Reviewed: Allergy & Precautions, NPO status , Patient's Chart, lab work & pertinent test results  History of Anesthesia Complications Negative for: history of anesthetic complications  Airway Mallampati: II  TM Distance: >3 FB Neck ROM: Full    Dental  (+) Teeth Intact, Missing, Poor Dentition,    Pulmonary neg shortness of breath, neg sleep apnea, neg COPD, neg recent URI, Current Smoker and Patient abstained from smoking.,  Covid-19 Nucleic Acid Test Results Lab Results      Component                Value               Date                      SARSCOV2NAA              NEGATIVE            04/21/2020                SARSCOV2NAA              NEGATIVE            08/19/2018              Pulmonary exam normal breath sounds clear to auscultation       Cardiovascular hypertension, Pt. on medications and Pt. on home beta blockers (-) angina+ CAD  Normal cardiovascular exam Rhythm:Regular Rate:Normal   Nuclear stress EF: 68%.  The left ventricular ejection fraction is hyperdynamic (>65%).  There was no ST segment deviation noted during stress.  The study is normal.  This is a low risk study.   Normal pharmacologic nuclear study with no evidence for a prior infarct or ischemia. Since the prior study in 2013 ischemia is no longer present.    Neuro/Psych neg Seizures  Neuromuscular disease negative psych ROS   GI/Hepatic negative GI ROS, Neg liver ROS, Lab Results      Component                Value               Date                      ALT                      18                  08/04/2019                AST                      17                  08/04/2019                ALKPHOS                  109                 08/04/2019                BILITOT                  0.6  08/04/2019              Endo/Other   diabetes, Type 2, Oral Hypoglycemic Agents  Renal/GU Renal diseasenegative Renal ROSLab Results      Component                Value               Date                      CREATININE               0.80                04/21/2020                Musculoskeletal  (+) Arthritis ,   Abdominal   Peds  Hematology negative hematology ROS (+) Lab Results      Component                Value               Date                      WBC                      7.6                 04/21/2020                HGB                      12.6                04/21/2020                HCT                      37.3                04/21/2020                MCV                      90.5                04/21/2020                PLT                      258                 04/21/2020              Anesthesia Other Findings   Reproductive/Obstetrics                            Anesthesia Physical  Anesthesia Plan  ASA: III  Anesthesia Plan: General   Post-op Pain Management:    Induction: Intravenous  PONV Risk Score and Plan: 3 and Ondansetron, Dexamethasone, Midazolam, Diphenhydramine and Treatment may vary due to age or medical condition  Airway Management Planned: Oral ETT  Additional Equipment: None  Intra-op Plan:   Post-operative Plan: Extubation in OR  Informed Consent: I have reviewed the patients History and Physical, chart, labs and discussed the procedure including the risks, benefits  and alternatives for the proposed anesthesia with the patient or authorized representative who has indicated his/her understanding and acceptance.     Dental advisory given  Plan Discussed with: CRNA  Anesthesia Plan Comments:        Anesthesia Quick Evaluation

## 2020-05-25 NOTE — Plan of Care (Signed)
  Problem: Education: Goal: Knowledge of General Education information will improve Description: Including pain rating scale, medication(s)/side effects and non-pharmacologic comfort measures 05/25/2020 1937 by Drue Dun, RN Outcome: Progressing 05/25/2020 1856 by Drue Dun, RN Outcome: Progressing   Problem: Health Behavior/Discharge Planning: Goal: Ability to manage health-related needs will improve 05/25/2020 1937 by Drue Dun, RN Outcome: Progressing 05/25/2020 1856 by Drue Dun, RN Outcome: Progressing   Problem: Clinical Measurements: Goal: Ability to maintain clinical measurements within normal limits will improve 05/25/2020 1937 by Drue Dun, RN Outcome: Progressing 05/25/2020 1856 by Drue Dun, RN Outcome: Progressing Goal: Will remain free from infection 05/25/2020 1937 by Drue Dun, RN Outcome: Progressing 05/25/2020 1856 by Drue Dun, RN Outcome: Progressing Goal: Diagnostic test results will improve 05/25/2020 1937 by Drue Dun, RN Outcome: Progressing 05/25/2020 1856 by Drue Dun, RN Outcome: Progressing Goal: Respiratory complications will improve 05/25/2020 1937 by Drue Dun, RN Outcome: Progressing 05/25/2020 1856 by Drue Dun, RN Outcome: Progressing Goal: Cardiovascular complication will be avoided 05/25/2020 1937 by Drue Dun, RN Outcome: Progressing 05/25/2020 1856 by Drue Dun, RN Outcome: Progressing   Problem: Activity: Goal: Risk for activity intolerance will decrease 05/25/2020 1937 by Drue Dun, RN Outcome: Progressing 05/25/2020 1856 by Drue Dun, RN Outcome: Progressing   Problem: Nutrition: Goal: Adequate nutrition will be maintained 05/25/2020 1937 by Drue Dun, RN Outcome: Progressing 05/25/2020 1856 by Drue Dun, RN Outcome: Progressing   Problem: Coping: Goal: Level of anxiety will decrease 05/25/2020 1937 by Drue Dun, RN Outcome: Progressing 05/25/2020 1856 by Drue Dun, RN Outcome: Progressing   Problem: Elimination: Goal: Will not experience complications related to bowel motility 05/25/2020 1937 by Drue Dun, RN Outcome: Progressing 05/25/2020 1856 by Drue Dun, RN Outcome: Progressing Goal: Will not experience complications related to urinary retention 05/25/2020 1937 by Drue Dun, RN Outcome: Progressing 05/25/2020 1856 by Drue Dun, RN Outcome: Progressing   Problem: Pain Managment: Goal: General experience of comfort will improve 05/25/2020 1937 by Drue Dun, RN Outcome: Progressing 05/25/2020 1856 by Drue Dun, RN Outcome: Progressing   Problem: Safety: Goal: Ability to remain free from injury will improve 05/25/2020 1937 by Drue Dun, RN Outcome: Progressing 05/25/2020 1856 by Drue Dun, RN Outcome: Progressing   Problem: Skin Integrity: Goal: Risk for impaired skin integrity will decrease 05/25/2020 1937 by Drue Dun, RN Outcome: Progressing 05/25/2020 1856 by Drue Dun, RN Outcome: Progressing

## 2020-05-25 NOTE — Progress Notes (Signed)
Labs reviewed, questions answered, she actually looks better clinically, pain well controlled, she is ready for I&D lumbar wound.

## 2020-05-25 NOTE — Progress Notes (Signed)
Notified floor RN that per anesthesia protocol, pt needs to received beta blocker per parameters, as well as oral medications on call to OR.

## 2020-05-26 ENCOUNTER — Encounter (HOSPITAL_COMMUNITY): Payer: Self-pay | Admitting: Neurological Surgery

## 2020-05-26 LAB — GLUCOSE, CAPILLARY
Glucose-Capillary: 97 mg/dL (ref 70–99)
Glucose-Capillary: 95 mg/dL (ref 70–99)
Glucose-Capillary: 156 mg/dL — ABNORMAL HIGH (ref 70–99)
Glucose-Capillary: 114 mg/dL — ABNORMAL HIGH (ref 70–99)

## 2020-05-26 NOTE — Progress Notes (Addendum)
Subjective: Patient reports feeling a little better today than yesterday   Objective: Vital signs in last 24 hours: Temp:  [97.9 F (36.6 C)-100.1 F (37.8 C)] 97.9 F (36.6 C) (03/31 0345) Pulse Rate:  [74-86] 81 (03/31 0345) Resp:  [11-20] 18 (03/31 0345) BP: (104-132)/(55-80) 110/55 (03/31 0345) SpO2:  [93 %-100 %] 97 % (03/31 0345) Weight:  [72.5 kg] 72.5 kg (03/30 2040)  Intake/Output from previous day: 03/30 0701 - 03/31 0700 In: 1625 [I.V.:1175; IV Piggyback:450] Out: 900 [Urine:650; Drains:200; Blood:50] Intake/Output this shift: No intake/output data recorded.  Neurologic: Grossly normal  Lab Results: Lab Results  Component Value Date   WBC 13.1 (H) 05/24/2020   HGB 8.1 (L) 05/24/2020   HCT 26.0 (L) 05/24/2020   MCV 84.4 05/24/2020   PLT 272 05/24/2020   Lab Results  Component Value Date   INR 1.0 04/21/2020   BMET Lab Results  Component Value Date   NA 130 (L) 05/24/2020   K 3.6 05/24/2020   CL 99 05/24/2020   CO2 22 05/24/2020   GLUCOSE 160 (H) 05/24/2020   BUN 12 05/24/2020   CREATININE 1.03 (H) 05/24/2020   CALCIUM 9.0 05/24/2020    Studies/Results: No results found.  Assessment/Plan: Therapy  today. Blood cultures for rare gram positive cocci on cultures to prepare for PICC line placement. Will order cbc for anemia. BMP for hyponatremia.    LOS: 2 days    Tiana Loft [facility] 05/26/2020, 7:56 AM

## 2020-05-26 NOTE — Evaluation (Signed)
Occupational Therapy Evaluation Patient Details Name: April Shaffer MRN: [id number] DOB: Oct 10, 1950 Today's Date: 05/26/2020    History of Present Illness Pt is a 70 yo female presenting for lumbar wound infection, s/p I&D lumbar wound 3/30. PMH  L2-5 PLIF 04/25/20, CAD, DM Type ll, HTN, sleep apnea, bilateral cataract sx, and R THA (2020).   Clinical Impression   PTA patient reports needing some assist for ADLs from family, using RW for mobility with assist at times for safety since PLIF in Feb 2022.  Patient admitted for above and limited by problem list below, including back pain, precautions, decreased activity tolerance and balance.  She was educated on back precautions, ADL compensatory techniques and recommendations.  Patient requires min assist for bed mobility and transfers, setup for seated UB ADLs and up to mod assist for LB ADLs.  She has good support from family at dc, who assist as needed for ADLs.  Based on performance today, believe she will benefit from further OT services while admitted to optimize independence and return to PLOF, but anticipate no needs after dc home.     Follow Up Recommendations  No OT follow up;Supervision - Intermittent    Equipment Recommendations  None recommended by OT    Recommendations for Other Services       Precautions / Restrictions Precautions Precautions: Fall;Back Precaution Booklet Issued: No Precaution Comments: reviewed with pt, min cueing to adhere Required Braces or Orthoses:  (no brace needed order) Restrictions Weight Bearing Restrictions: No      Mobility Bed Mobility Overal bed mobility: Needs Assistance Bed Mobility: Rolling;Sidelying to Sit;Sit to Sidelying Rolling: Supervision Sidelying to sit: Min assist     Sit to sidelying: Min assist General bed mobility comments: pt sidelying upon entry, transitioned to EOB with min assist and returned to sidelying with min assist to ensure log roll technique     Transfers Overall transfer level: Needs assistance Equipment used: Rolling walker (2 wheeled) Transfers: Sit to/from Stand Sit to Stand: Min assist         General transfer comment: min assist to power up and steady, cueing for hand placement and posture    Balance Overall balance assessment: Needs assistance Sitting-balance support: No upper extremity supported;Feet supported Sitting balance-Leahy Scale: Fair     Standing balance support: Bilateral upper extremity supported;During functional activity Standing balance-Leahy Scale: Poor Standing balance comment: relies on BUE support                           ADL either performed or assessed with clinical judgement   ADL Overall ADL's : Needs assistance/impaired     Grooming: Set up;Sitting   Upper Body Bathing: Set up;Sitting   Lower Body Bathing: Minimal assistance;Sit to/from stand   Upper Body Dressing : Set up;Sitting   Lower Body Dressing: Moderate assistance;Sit to/from stand   Toilet Transfer: Minimal assistance;Ambulation;RW Toilet Transfer Details (indicate cue type and reason): simulated in room Toileting- Clothing Manipulation and Hygiene: Minimal assistance;Sit to/from stand       Functional mobility during ADLs: Minimal assistance;Min guard;Rolling walker General ADL Comments: pt limited by pain and precautions     Vision   Vision Assessment?: No apparent visual deficits     Perception     Praxis      Pertinent Vitals/Pain Pain Assessment: 0-10 Pain Score: 8  Pain Location: back, incisional Pain Descriptors / Indicators: Discomfort;Grimacing;Operative site guarding Pain Intervention(s): Limited activity within patient's tolerance;Monitored during  session;Repositioned;Premedicated before session     Hand Dominance     Extremity/Trunk Assessment Upper Extremity Assessment Upper Extremity Assessment: Overall WFL for tasks assessed   Lower Extremity Assessment Lower  Extremity Assessment: Defer to PT evaluation   Cervical / Trunk Assessment Cervical / Trunk Assessment: Other exceptions Cervical / Trunk Exceptions: s/p I&D back incision   Communication Communication Communication: No difficulties   Cognition Arousal/Alertness: Awake/alert Behavior During Therapy: WFL for tasks assessed/performed Overall Cognitive Status: Within Functional Limits for tasks assessed                                 General Comments: appears WFL for tasks assessed   General Comments  boyfriend present and supportive    Exercises     Shoulder Instructions      Home Living Family/patient expects to be discharged to:: Private residence Living Arrangements: Spouse/significant other Available Help at Discharge: Available 24 hours/day Type of Home: House Home Access: Stairs to enter [employer] of Steps: 1   Home Layout: One level     Bathroom Shower/Tub: Chief Strategy Officer: Standard (BSC over toilet)     Home Equipment: Walker - 2 wheels;Bedside commode;Shower seat;Hand held shower head;Adaptive equipment;Cane - single point Adaptive Equipment: Reacher        Prior Functioning/Environment Level of Independence: Needs assistance  Gait / Transfers Assistance Needed: using RW, some assist for transfers ADL's / Homemaking Assistance Needed: need some assist for bathing, LB dressing   Comments: independent prior to surgery but needing more assist since        OT Problem List: Decreased activity tolerance;Impaired balance (sitting and/or standing);Decreased knowledge of use of DME or AE;Decreased knowledge of precautions;Pain      OT Treatment/Interventions: Self-care/ADL training;DME and/or AE instruction;Patient/family education;Balance training;Therapeutic activities    OT Goals(Current goals can be found in the care plan section) Acute Rehab OT Goals Patient Stated Goal: less pain OT Goal Formulation: With  patient Time For Goal Achievement: 06/09/20 Potential to Achieve Goals: Good  OT Frequency: Min 2X/week   Barriers to D/C:            Co-evaluation              AM-PAC OT "6 Clicks" Daily Activity     Outcome Measure Help from another person eating meals?: None Help from another person taking care of personal grooming?: A Little Help from another person toileting, which includes using toliet, bedpan, or urinal?: A Little Help from another person bathing (including washing, rinsing, drying)?: A Little Help from another person to put on and taking off regular upper body clothing?: A Little Help from another person to put on and taking off regular lower body clothing?: A Lot 6 Click Score: 18   End of Session Equipment Utilized During Treatment: Rolling walker Nurse Communication: Mobility status;Precautions;Other (comment) (pain level)  Activity Tolerance: Patient tolerated treatment well Patient left: in bed;with call bell/phone within reach;with bed alarm set;with family/visitor present  OT Visit Diagnosis: Other abnormalities of gait and mobility (R26.89);Pain Pain - part of body:  (back)                Time: 9371-6967 OT Time Calculation (min): 22 min Charges:  OT General Charges $OT Visit: 1 Visit OT Evaluation $OT Eval Moderate Complexity: 1 Mod  Barry Brunner, OT Acute Rehabilitation Services Pager [phone] Office [phone]   Chancy Milroy 05/26/2020, 10:59  AM

## 2020-05-26 NOTE — Progress Notes (Signed)
Pt unable to void, bladder scanned, yielded 632, In & Out pt, yielding 650 ml, post void 80ml, will pass on to oncoming shift to keep an eye on the pt

## 2020-05-26 NOTE — Anesthesia Postprocedure Evaluation (Signed)
Anesthesia Post Note  Patient: April Shaffer  Procedure(s) Performed: LUMBAR WOUND IRRIGATION AND DEBRIDEMENT (N/A Spine Lumbar)     Patient location during evaluation: PACU Anesthesia Type: General Level of consciousness: sedated and patient cooperative Pain management: pain level controlled Vital Signs Assessment: post-procedure vital signs reviewed and stable Respiratory status: spontaneous breathing Cardiovascular status: stable Anesthetic complications: no   No complications documented.  Last Vitals:  Vitals:   05/26/20 1122 05/26/20 1605  BP: (!) 101/43 (!) 120/57  Pulse: 76 81  Resp: 16 17  Temp: 36.7 C 36.8 C  SpO2: 100% 98%    Last Pain:  Vitals:   05/26/20 1605  TempSrc: Oral  PainSc: 5                  Lewie Loron

## 2020-05-27 ENCOUNTER — Inpatient Hospital Stay (HOSPITAL_COMMUNITY): Payer: Medicare HMO

## 2020-05-27 LAB — CBC WITH DIFFERENTIAL/PLATELET
Abs Immature Granulocytes: 0.3 10*3/uL — ABNORMAL HIGH (ref 0.00–0.07)
Basophils Absolute: 0 10*3/uL (ref 0.0–0.1)
Basophils Relative: 0 %
Eosinophils Absolute: 0.3 10*3/uL (ref 0.0–0.5)
Eosinophils Relative: 2 %
HCT: 19.6 % — ABNORMAL LOW (ref 36.0–46.0)
Hemoglobin: 6.4 g/dL — CL (ref 12.0–15.0)
Immature Granulocytes: 2 %
Lymphocytes Relative: 14 %
Lymphs Abs: 1.7 10*3/uL (ref 0.7–4.0)
MCH: 26.9 pg (ref 26.0–34.0)
MCHC: 32.7 g/dL (ref 30.0–36.0)
MCV: 82.4 fL (ref 80.0–100.0)
Monocytes Absolute: 1.2 10*3/uL — ABNORMAL HIGH (ref 0.1–1.0)
Monocytes Relative: 10 %
Neutro Abs: 9 10*3/uL — ABNORMAL HIGH (ref 1.7–7.7)
Neutrophils Relative %: 72 %
Platelets: 260 10*3/uL (ref 150–400)
RBC: 2.38 MIL/uL — ABNORMAL LOW (ref 3.87–5.11)
RDW: 17.5 % — ABNORMAL HIGH (ref 11.5–15.5)
WBC: 12.5 10*3/uL — ABNORMAL HIGH (ref 4.0–10.5)
nRBC: 0 % (ref 0.0–0.2)

## 2020-05-27 LAB — BASIC METABOLIC PANEL
Anion gap: 7 (ref 5–15)
BUN: 7 mg/dL — ABNORMAL LOW (ref 8–23)
CO2: 18 mmol/L — ABNORMAL LOW (ref 22–32)
Calcium: 8.1 mg/dL — ABNORMAL LOW (ref 8.9–10.3)
Chloride: 110 mmol/L (ref 98–111)
Creatinine, Ser: 0.78 mg/dL (ref 0.44–1.00)
GFR, Estimated: >60 mL/min (ref >60)
Glucose, Bld: 92 mg/dL (ref 70–99)
Potassium: 3.2 mmol/L — ABNORMAL LOW (ref 3.5–5.1)
Sodium: 135 mmol/L (ref 135–145)

## 2020-05-27 LAB — GLUCOSE, CAPILLARY
Glucose-Capillary: 106 mg/dL — ABNORMAL HIGH (ref 70–99)
Glucose-Capillary: 101 mg/dL — ABNORMAL HIGH (ref 70–99)
Glucose-Capillary: 153 mg/dL — ABNORMAL HIGH (ref 70–99)
Glucose-Capillary: 145 mg/dL — ABNORMAL HIGH (ref 70–99)
Glucose-Capillary: 107 mg/dL — ABNORMAL HIGH (ref 70–99)
Glucose-Capillary: 90 mg/dL (ref 70–99)
Glucose-Capillary: 107 mg/dL — ABNORMAL HIGH (ref 70–99)

## 2020-05-27 LAB — PREPARE RBC (CROSSMATCH)

## 2020-05-27 MED ORDER — IOHEXOL 9 MG/ML PO SOLN
ORAL | Status: AC
  Administered 2020-05-27: 500 mL
  Filled 2020-05-27: qty 1000

## 2020-05-27 MED ORDER — IOHEXOL 300 MG/ML  SOLN
100.0000 mL | Freq: Once | INTRAMUSCULAR | Status: AC | PRN
  Administered 2020-05-27: 100 mL via INTRAVENOUS

## 2020-05-27 MED ORDER — SODIUM CHLORIDE 0.9% IV SOLUTION: Freq: Once | INTRAVENOUS | Status: AC

## 2020-05-27 MED ORDER — IOHEXOL 9 MG/ML PO SOLN
500.0000 mL | ORAL | Status: AC
  Administered 2020-05-27 (×2): 500 mL via ORAL

## 2020-05-27 NOTE — Progress Notes (Signed)
Physical Therapy Treatment Patient Details Name: April Shaffer MRN: [id number] DOB: 05/21/1950 Today's Date: 05/27/2020    History of Present Illness Pt is a 70 yo female presenting for lumbar wound infection, s/p I&D lumbar wound 3/30. PMH  L2-5 PLIF 04/25/20, CAD, DM Type ll, HTN, sleep apnea, bilateral cataract sx, and R THA (2020).    PT Comments    Patient progressing slowly towards PT goals. Reports feeling pain down LLE today and dizzy after getting up earlier. Noted to have low hemoglobin this AM. RN aware and informed MD. Requires supervision for bed mobility with heavy use of rail, but good demo of log roll technique. Requires assist to stand from EOB with cues for hand placement and use of RW. Ambulation distance limited as pt needed to have a bowel movement. Reviewed back precautions. Will continue to follow and progress as able.    Follow Up Recommendations  No PT follow up;Supervision - Intermittent     Equipment Recommendations  None recommended by PT    Recommendations for Other Services       Precautions / Restrictions Precautions Precautions: Fall;Back Precaution Booklet Issued: No Precaution Comments: reviewed back precautions Restrictions Weight Bearing Restrictions: No    Mobility  Bed Mobility Overal bed mobility: Needs Assistance Bed Mobility: Rolling;Sidelying to Sit Rolling: Supervision Sidelying to sit: Supervision;HOB elevated       General bed mobility comments: Increased time/effort and use of rail, good demo of log roll technique.    Transfers Overall transfer level: Needs assistance Equipment used: Rolling walker (2 wheeled) Transfers: Sit to/from Stand Sit to Stand: Min assist         General transfer comment: Assist to power to standing with cues for hand placement. Stood from [employer], transferred to toilet in bathroom.  Ambulation/Gait Ambulation/Gait assistance: Min guard Gait Distance (Feet): 15 Feet Assistive device: Rolling  walker (2 wheeled) Gait Pattern/deviations: Step-through pattern;Decreased stride length;Trunk flexed Gait velocity: decr   General Gait Details: Slow, guarded gait with RW for support; cues for upright. Distance limited as pt needed to have a bowel movement.   Stairs             Wheelchair Mobility    Modified Rankin (Stroke Patients Only)       Balance Overall balance assessment: Needs assistance Sitting-balance support: No upper extremity supported;Feet supported Sitting balance-Leahy Scale: Fair     Standing balance support: During functional activity Standing balance-Leahy Scale: Poor Standing balance comment: Use of RW for support.                            Cognition Arousal/Alertness: Awake/alert Behavior During Therapy: WFL for tasks assessed/performed Overall Cognitive Status: Within Functional Limits for tasks assessed                                 General Comments: appears WFL for tasks assessed      Exercises      General Comments General comments (skin integrity, edema, etc.): Boyfriend present during session.      Pertinent Vitals/Pain Pain Assessment: Faces Faces Pain Scale: Hurts even more Pain Location: back, LLE Pain Descriptors / Indicators: Grimacing;Burning;Operative site guarding;Sore Pain Intervention(s): Monitored during session;Repositioned;Limited activity within patient's tolerance;RN gave pain meds during session    Home Living  Prior Function            PT Goals (current goals can now be found in the care plan section) Progress towards PT goals: Progressing toward goals (slowly)    Frequency    Min 5X/week      PT Plan Current plan remains appropriate    Co-evaluation              AM-PAC PT "6 Clicks" Mobility   Outcome Measure  Help needed turning from your back to your side while in a flat bed without using bedrails?: A Little Help needed  moving from lying on your back to sitting on the side of a flat bed without using bedrails?: A Little Help needed moving to and from a bed to a chair (including a wheelchair)?: A Little Help needed standing up from a chair using your arms (e.g., wheelchair or bedside chair)?: A Little Help needed to walk in hospital room?: A Little Help needed climbing 3-5 steps with a railing? : A Little 6 Click Score: 18    End of Session Equipment Utilized During Treatment: Gait belt Activity Tolerance: Patient tolerated treatment well Patient left: Other (comment);with family/visitor present (in bathroom) Nurse Communication: Mobility status;Other (comment) (tech aware pt is in bathroom) PT Visit Diagnosis: Other abnormalities of gait and mobility (R26.89);Pain Pain - part of body:  (back, LLE)     Time: 3267-1245 PT Time Calculation (min) (ACUTE ONLY): 12 min  Charges:  $Gait Training: 8-22 mins                     Vale Haven, PT, DPT Acute Rehabilitation Services Pager [phone] Office [phone]       April Shaffer 05/27/2020, 4:32 PM

## 2020-05-27 NOTE — Progress Notes (Signed)
Late entry for 05/26/20  05/26/20 1335  PT Visit Information  Last PT Received On 05/26/20  Assistance Needed +1  History of Present Illness Pt is a 70 yo female presenting for lumbar wound infection, s/p I&D lumbar wound 3/30. PMH  L2-5 PLIF 04/25/20, CAD, DM Type ll, HTN, sleep apnea, bilateral cataract sx, and R THA (2020).  Precautions  Precautions Fall;Back  Precaution Booklet Issued No  Required Braces or Orthoses  (no brace needed order)  Restrictions  Weight Bearing Restrictions No  Home Living  Family/patient expects to be discharged to: Private residence  Living Arrangements Spouse/significant other  Available Help at Discharge Available 24 hours/day  Type of Home House  Home Access Stairs to enter  Entrance Stairs-Number of Steps 1  Home Layout One level  Bathroom [employer] copy (BSC over toilet)  Home Equipment Walker - 2 wheels;BSC;Shower seat;Hand held shower head;Adaptive equipment;Cane - single point  Adaptive Equipment Reacher  Prior Function  Level of Independence Needs assistance  Gait / Transfers Assistance Needed using RW, some assist for transfers  ADL's / Homemaking Assistance Needed need some assist for bathing, LB dressing  Comments independent prior to surgery but needing more assist since  Communication  Communication No difficulties  Pain Assessment  Pain Assessment 0-10  Pain Score 8  Pain Location back, incisional  Pain Descriptors / Indicators Grimacing;Burning;Operative site guarding  Pain Intervention(s) Limited activity within patient's tolerance;Repositioned  Cognition  Arousal/Alertness Awake/alert  Behavior During Therapy WFL for tasks assessed/performed  Overall Cognitive Status Within Functional Limits for tasks assessed  Upper Extremity Assessment  Upper Extremity Assessment Defer to OT evaluation  Lower Extremity Assessment  Lower Extremity Assessment Overall WFL for tasks assessed  Cervical /  Trunk Assessment  Cervical / Trunk Assessment Other exceptions  Cervical / Trunk Exceptions s/p I&D back incision  Bed Mobility  Overal bed mobility Needs Assistance  Bed Mobility Rolling;Sidelying to Sit  Rolling Supervision  Sidelying to sit Min guard  General bed mobility comments Incr time and effort.  Transfers  Overall transfer level Needs assistance  Equipment used Rolling walker (2 wheeled)  Transfers Sit to/from Stand  Sit to Stand Min assist  General transfer comment Assist to bring hips up  Ambulation/Gait  Ambulation/Gait assistance Min guard  Gait Distance (Feet) 125 Feet  Assistive device Rolling walker (2 wheeled)  Gait Pattern/deviations Step-through pattern;Decreased stride length;Trunk flexed  General Gait Details Assist for safety. Cues to stand more erect.  Gait velocity decr  Gait velocity interpretation <1.31 ft/sec, indicative of household ambulator  Balance  Overall balance assessment Needs assistance  Sitting-balance support No upper extremity supported;Feet supported  Sitting balance-Leahy Scale Fair  Standing balance support Bilateral upper extremity supported;During functional activity  Standing balance-Leahy Scale Poor  Standing balance comment walker and supervision for static standing  PT - End of Session  Equipment Utilized During Treatment Gait belt  Activity Tolerance Patient tolerated treatment well  Patient left in chair;with call bell/phone within reach;with chair alarm set  Nurse Communication Mobility status  PT Assessment  PT Recommendation/Assessment Patient needs continued PT services  PT Visit Diagnosis Other abnormalities of gait and mobility (R26.89);Pain  Pain - part of body  (back)  PT Problem List Decreased mobility;Decreased balance;Pain  PT Plan  PT Frequency (ACUTE ONLY) Min 5X/week  PT Treatment/Interventions (ACUTE ONLY) DME instruction;Gait training;Functional mobility training;Therapeutic activities;Therapeutic  exercise;Balance training;Patient/family education  AM-PAC PT "6 Clicks" Mobility Outcome Measure (Version 2)  Help needed turning  from your back to your side while in a flat bed without using bedrails? 3  Help needed moving from lying on your back to sitting on the side of a flat bed without using bedrails? 3  Help needed moving to and from a bed to a chair (including a wheelchair)? 3  Help needed standing up from a chair using your arms (e.g., wheelchair or bedside chair)? 3  Help needed to walk in hospital room? 3  Help needed climbing 3-5 steps with a railing?  3  6 Click Score 18  Consider Recommendation of Discharge To: Home with [facility]  PT Recommendation  Follow Up Recommendations No PT follow up  PT equipment None recommended by PT  Individuals Consulted  Consulted and Agree with Results and Recommendations Patient  Acute Rehab PT Goals  Patient Stated Goal less pain  PT Goal Formulation With patient  Time For Goal Achievement 06/02/20  Potential to Achieve Goals Good  PT Time Calculation  PT Start Time (ACUTE ONLY) 0941  PT Stop Time (ACUTE ONLY) 0959  PT Time Calculation (min) (ACUTE ONLY) 18 min  PT General Charges  $$ ACUTE PT VISIT 1 Visit  PT Evaluation  $PT Eval Moderate Complexity 1 Mod    Pt admitted with above diagnosis and presents to PT with functional limitations due to deficits listed below (See PT problem list). Pt needs skilled PT to maximize independence and safety to allow discharge to home with family support  [facility] PT Acute Rehabilitation Services Pager [phone] Office [phone] .

## 2020-05-27 NOTE — Progress Notes (Signed)
Subjective: Patient reports back soreness as well as some left hip and lateral leg pain.  Still complaining of some abdominal discomfort.  Objective: Vital signs in last 24 hours: Temp:  [97.4 F (36.3 C)-99.4 F (37.4 C)] 97.4 F (36.3 C) (04/01 0819) Pulse Rate:  [81-91] 89 (04/01 1018) Resp:  [16-18] 17 (04/01 1018) BP: (120-134)/(54-64) 122/54 (04/01 1018) SpO2:  [97 %-100 %] 100 % (04/01 0819)  Intake/Output from previous day: 03/31 0701 - 04/01 0700 In: 1181.4 [P.O.:361; I.V.:520.4; IV Piggyback:300] Out: 1140 [Urine:550; Drains:390; Stool:200] Intake/Output this shift: No intake/output data recorded.  She is awake and alert and interactive, her abdomen is soft but there is some guarding on the left, the left leg hurts to flex at the hip and extend at the knee but I think her strength is probably okay, otherwise she is moving well.  Lab Results: Lab Results  Component Value Date   WBC 12.5 (H) 05/27/2020   HGB 6.4 (LL) 05/27/2020   HCT 19.6 (L) 05/27/2020   MCV 82.4 05/27/2020   PLT 260 05/27/2020   Lab Results  Component Value Date   INR 1.0 04/21/2020   BMET Lab Results  Component Value Date   NA 135 05/27/2020   K 3.2 (L) 05/27/2020   CL 110 05/27/2020   CO2 18 (L) 05/27/2020   GLUCOSE 92 05/27/2020   BUN 7 (L) 05/27/2020   CREATININE 0.78 05/27/2020   CALCIUM 8.1 (L) 05/27/2020    Studies/Results: No results found.  Assessment/Plan: 1.  Anemia with hemoglobin 6.4.  Some of this is blood loss anemia from surgery.  Some of this is probably anemia of chronic disease.  She had a hemoglobin of 8.1 when she was admitted and this was being evaluated by her primary care physician.  We will transfuse 2 units of blood and see how she responds.  2.  Abdominal pain.  Probably referred pain from the surgery but she does have some tenderness and with her blood loss anemia we will check a CT scan of the abdomen and pelvis.  She has left hip pain but I doubt septic  arthritis.  This is probably radiculitis.  3.  Wound infection.  Continue IV antibiotics and await cultures and sensitivities.  Estimated body mass index is 28.31 kg/m as calculated from the following:   Height as of this encounter: 5\' 3"  (1.6 m).   Weight as of this encounter: 72.5 kg.    LOS: 3 days    05/27/2020, 11:34 AM

## 2020-05-27 NOTE — Care Management Important Message (Signed)
Important Message  Patient Details  Name: April Shaffer MRN: [id number] Date of Birth: 09-Feb-1951   Medicare Important Message Given:  Yes     Dorena Bodo 05/27/2020, 2:05 PM

## 2020-05-28 ENCOUNTER — Inpatient Hospital Stay (HOSPITAL_COMMUNITY): Payer: Medicare HMO

## 2020-05-28 LAB — CBC WITH DIFFERENTIAL/PLATELET
Abs Immature Granulocytes: 0.68 10*3/uL — ABNORMAL HIGH (ref 0.00–0.07)
Basophils Absolute: 0 10*3/uL (ref 0.0–0.1)
Basophils Relative: 0 %
Eosinophils Absolute: 0.3 10*3/uL (ref 0.0–0.5)
Eosinophils Relative: 2 %
HCT: 27.1 % — ABNORMAL LOW (ref 36.0–46.0)
Hemoglobin: 9.2 g/dL — ABNORMAL LOW (ref 12.0–15.0)
Immature Granulocytes: 5 %
Lymphocytes Relative: 13 %
Lymphs Abs: 2 10*3/uL (ref 0.7–4.0)
MCH: 27.7 pg (ref 26.0–34.0)
MCHC: 33.9 g/dL (ref 30.0–36.0)
MCV: 81.6 fL (ref 80.0–100.0)
Monocytes Absolute: 1.1 10*3/uL — ABNORMAL HIGH (ref 0.1–1.0)
Monocytes Relative: 8 %
Neutro Abs: 10.9 10*3/uL — ABNORMAL HIGH (ref 1.7–7.7)
Neutrophils Relative %: 72 %
Platelets: 311 10*3/uL (ref 150–400)
RBC: 3.32 MIL/uL — ABNORMAL LOW (ref 3.87–5.11)
RDW: 16.4 % — ABNORMAL HIGH (ref 11.5–15.5)
WBC: 14.8 10*3/uL — ABNORMAL HIGH (ref 4.0–10.5)
nRBC: 0 % (ref 0.0–0.2)

## 2020-05-28 LAB — BPAM RBC
Blood Product Expiration Date: 202204202359
Blood Product Expiration Date: 202204282359
ISSUE DATE / TIME: 202204011212
ISSUE DATE / TIME: 202204011621
Unit Type and Rh: 5100
Unit Type and Rh: 5100

## 2020-05-28 LAB — TYPE AND SCREEN
ABO/RH(D): O POS
Antibody Screen: NEGATIVE
Unit division: 0
Unit division: 0

## 2020-05-28 LAB — BASIC METABOLIC PANEL
Anion gap: 7 (ref 5–15)
BUN: <5 mg/dL — ABNORMAL LOW (ref 8–23)
CO2: 18 mmol/L — ABNORMAL LOW (ref 22–32)
Calcium: 8.1 mg/dL — ABNORMAL LOW (ref 8.9–10.3)
Chloride: 106 mmol/L (ref 98–111)
Creatinine, Ser: 0.64 mg/dL (ref 0.44–1.00)
GFR, Estimated: >60 mL/min (ref >60)
Glucose, Bld: 91 mg/dL (ref 70–99)
Potassium: 3.3 mmol/L — ABNORMAL LOW (ref 3.5–5.1)
Sodium: 131 mmol/L — ABNORMAL LOW (ref 135–145)

## 2020-05-28 LAB — GLUCOSE, CAPILLARY
Glucose-Capillary: 87 mg/dL (ref 70–99)
Glucose-Capillary: 102 mg/dL — ABNORMAL HIGH (ref 70–99)
Glucose-Capillary: 104 mg/dL — ABNORMAL HIGH (ref 70–99)
Glucose-Capillary: 101 mg/dL — ABNORMAL HIGH (ref 70–99)

## 2020-05-28 MED ORDER — GADOBUTROL 1 MMOL/ML IV SOLN
7.0000 mL | Freq: Once | INTRAVENOUS | Status: AC | PRN
  Administered 2020-05-28: 7 mL via INTRAVENOUS

## 2020-05-28 MED ORDER — POTASSIUM CHLORIDE CRYS ER 20 MEQ PO TBCR
40.0000 meq | EXTENDED_RELEASE_TABLET | Freq: Two times a day (BID) | ORAL | Status: AC
  Administered 2020-05-28 (×2): 40 meq via ORAL
  Filled 2020-05-28 (×2): qty 2

## 2020-05-28 NOTE — Progress Notes (Signed)
Occupational Therapy Treatment Patient Details Name: April Shaffer MRN: [id number] DOB: 1950-10-29 Today's Date: 05/28/2020    History of present illness Pt is a 70 yo female presenting for lumbar wound infection, s/p I&D lumbar wound 3/30. PMH  L2-5 PLIF 04/25/20, CAD, DM Type ll, HTN, sleep apnea, bilateral cataract sx, and R THA (2020).   OT comments  Patient continues to make steady progress towards goals in skilled OT session. Patient's session encompassed bed mobility, functional mobility, education with regard to energy conservation, and improving activity tolerance and endurance. Pt remains highly motivated to work with therapy, demonstrating good log roll and reverse log roll techniques during session. Pt able to complete functional mobility of minimal household distances with walker, with verbal acknowledgment noted with regard to energy conservation techniques. Pt demonstrating increased standing balance to complete grooming tasks at sink, but braces with one hand on countertop for safety. Discharge remains appropriate at this time; therapy will continue to follow.    Follow Up Recommendations  No OT follow up;Supervision - Intermittent    Equipment Recommendations  None recommended by OT    Recommendations for Other Services      Precautions / Restrictions Precautions Precautions: Fall;Back Precaution Booklet Issued: No Precaution Comments: reviewed back precautions Required Braces or Orthoses:  (no brace needed) Restrictions Weight Bearing Restrictions: No       Mobility Bed Mobility Overal bed mobility: Needs Assistance Bed Mobility: Rolling;Sidelying to Sit Rolling: Supervision Sidelying to sit: Supervision;HOB elevated       General bed mobility comments: Increased time/effort and use of rail, good demo of log roll and reverse log roll technique.    Transfers Overall transfer level: Needs assistance Equipment used: Rolling walker (2 wheeled) Transfers: Sit  to/from Stand Sit to Stand: Min guard         General transfer comment: able to complete x1 from EOB, minimal increased time needed    Balance Overall balance assessment: Needs assistance Sitting-balance support: No upper extremity supported;Feet supported Sitting balance-Leahy Scale: Fair     Standing balance support: During functional activity Standing balance-Leahy Scale: Poor Standing balance comment: Use of RW for support.                           ADL either performed or assessed with clinical judgement   ADL Overall ADL's : Needs assistance/impaired     Grooming: Set up;Standing;Wash/dry hands;Wash/dry face;Oral care Grooming Details (indicate cue type and reason): able to complete standing at sink                 Toilet Transfer: Minimal assistance;Ambulation;RW Toilet Transfer Details (indicate cue type and reason): simulated in room         Functional mobility during ADLs: Minimal assistance;Min guard;Rolling walker General ADL Comments: pt continues to progress with functional ambulation and activity tolerance     Vision       Perception     Praxis      Cognition Arousal/Alertness: Awake/alert Behavior During Therapy: WFL for tasks assessed/performed Overall Cognitive Status: Within Functional Limits for tasks assessed                                          Exercises     Shoulder Instructions       General Comments      Pertinent Vitals/ Pain  Pain Assessment: Faces Faces Pain Scale: Hurts a little bit Pain Location: back, LLE Pain Descriptors / Indicators: Grimacing;Burning;Operative site guarding;Sore Pain Intervention(s): Limited activity within patient's tolerance;Monitored during session;Repositioned  Home Living                                          Prior Functioning/Environment              Frequency           Progress Toward Goals  OT Goals(current  goals can now be found in the care plan section)  Progress towards OT goals: Progressing toward goals  Acute Rehab OT Goals Patient Stated Goal: less pain OT Goal Formulation: With patient Time For Goal Achievement: 06/09/20 Potential to Achieve Goals: Good  Plan Discharge plan remains appropriate    Co-evaluation                 AM-PAC OT "6 Clicks" Daily Activity     Outcome Measure   Help from another person eating meals?: None Help from another person taking care of personal grooming?: None Help from another person toileting, which includes using toliet, bedpan, or urinal?: A Little Help from another person bathing (including washing, rinsing, drying)?: A Little Help from another person to put on and taking off regular upper body clothing?: A Little Help from another person to put on and taking off regular lower body clothing?: A Lot 6 Click Score: 19    End of Session Equipment Utilized During Treatment: Rolling walker  OT Visit Diagnosis: Other abnormalities of gait and mobility (R26.89);Pain   Activity Tolerance Patient tolerated treatment well   Patient Left in bed;with call bell/phone within reach;with family/visitor present   Nurse Communication Mobility status;Precautions        Time: 6644-0347 OT Time Calculation (min): 23 min  Charges: OT General Charges $OT Visit: 1 Visit OT Treatments $Self Care/Home Management : 23-37 mins  April Shaffer, COTA/L Acute Rehabilitation Services [phone]   Cherlyn Cushing 05/28/2020, 2:52 PM

## 2020-05-28 NOTE — Progress Notes (Signed)
Subjective: Patient reports still some back soreness and some left lateral thigh pain and some abdominal soreness.  She is sitting at the side of the bed today.  She did well with therapy yesterday.  Objective: Vital signs in last 24 hours: Temp:  [97.3 F (36.3 C)-98.8 F (37.1 C)] 97.8 F (36.6 C) (04/02 0730) Pulse Rate:  [79-89] 82 (04/02 0730) Resp:  [16-20] 16 (04/02 0730) BP: (117-164)/(54-83) 156/72 (04/02 0730) SpO2:  [97 %-100 %] 100 % (04/02 0730)  Intake/Output from previous day: 04/01 0701 - 04/02 0700 In: 727.3 [Blood:727.3] Out: 50 [Drains:50] Intake/Output this shift: No intake/output data recorded.  Neurologic: Grossly normal to seated exam, dressing is dry, drain is in place  Lab Results: Lab Results  Component Value Date   WBC 14.8 (H) 05/28/2020   HGB 9.2 (L) 05/28/2020   HCT 27.1 (L) 05/28/2020   MCV 81.6 05/28/2020   PLT 311 05/28/2020   Lab Results  Component Value Date   INR 1.0 04/21/2020   BMET Lab Results  Component Value Date   NA 131 (L) 05/28/2020   K 3.3 (L) 05/28/2020   CL 106 05/28/2020   CO2 18 (L) 05/28/2020   GLUCOSE 91 05/28/2020   BUN <5 (L) 05/28/2020   CREATININE 0.64 05/28/2020   CALCIUM 8.1 (L) 05/28/2020    Studies/Results: CT ABDOMEN PELVIS W CONTRAST  Result Date: 05/27/2020 CLINICAL DATA:  Nonlocalized acute abdominal pain. Mostly on left. History of hysterectomy. EXAM: CT ABDOMEN AND PELVIS WITH CONTRAST TECHNIQUE: Multidetector CT imaging of the abdomen and pelvis was performed using the standard protocol following bolus administration of intravenous contrast. CONTRAST:  OMNIPAQUE IOHEXOL 300 MG/ML  SOLN COMPARISON:  X-ray lumbar spine 04/25/2020 FINDINGS: Lower chest: No acute abnormality. Hepatobiliary: The hepatic parenchyma is diffusely hypodense compared to the splenic parenchyma consistent with fatty infiltration. No focal liver abnormality. No gallstones, gallbladder wall thickening, or pericholecystic  fluid. No biliary dilatation. Pancreas: No focal lesion. Normal pancreatic contour. No surrounding inflammatory changes. No main pancreatic ductal dilatation. Spleen: Normal in size without focal abnormality. Adrenals/Urinary Tract: No adrenal nodule bilaterally. Bilateral kidneys enhance symmetrically. There is a 3.8 cm fluid density lesion within left kidney likely represents a simple renal cyst. Several pericentimeter hypodensities within the kidneys appears slightly higher in density than simple free fluid. No hydronephrosis. No hydroureter. Limited evaluation of the urinary bladder due to streak artifact originating from the right femoral surgical hardware. Stomach/Bowel: PO contrast reaches the rectum. Stomach is within normal limits. No evidence of bowel wall thickening or dilatation. The majority of the large bowel is decompressed. Appendix not definitely identified. Vascular/Lymphatic: No abdominal aorta or iliac aneurysm. Moderate to severe calcified and noncalcified atherosclerotic plaque of the aorta and its branches. No abdominal, pelvic, or inguinal lymphadenopathy. Reproductive: Status post hysterectomy. No adnexal masses. Other: No intraperitoneal free fluid. No intraperitoneal free gas. No organized fluid collection. Musculoskeletal: Mild subcutaneus soft tissue edema. Total right hip arthroplasty is partially visualized. L1 through L5 posterolateral fusion. Grade 1 anterolisthesis of L4 on L5. No acute displaced fracture. Multilevel degenerative changes of the spine. No suspicious lytic or blastic osseous lesions. IMPRESSION: 1. No acute intra-abdominal or intrapelvic abnormality. 2. Several pericentimeter hypodensities within the kidneys appears slightly higher in density than simple free fluid. These could represent complex cyst versus solid masses. Recommend MRI renal protocol for further evaluation. 3. Grade 1 anterolisthesis of L4 on L5 in a patient with L1 through L5 posterolateral fusion. 4.   Aortic Atherosclerosis (  ICD10-I70.0). Electronically Signed   By: Tish Frederickson M.D.   On: 05/27/2020 23:00    Assessment/Plan: 1.  Deep wound space infection.  Continue vancomycin and rifampin for now.  Await culture sensitivities.  2.  Ordered MRI of abdomen as recommended by the radiologist  Estimated body mass index is 28.31 kg/m as calculated from the following:   Height as of this encounter: 5\' 3"  (1.6 m).   Weight as of this encounter: 72.5 kg.    LOS: 4 days    05/28/2020, 8:50 AM

## 2020-05-28 NOTE — Progress Notes (Addendum)
Physical Therapy Treatment Patient Details Name: April Shaffer MRN: [id number] DOB: 04-02-1950 Today's Date: 05/28/2020    History of Present Illness Pt is a 70 yo female presenting for lumbar wound infection, s/p I&D lumbar wound 3/30. PMH  L2-5 PLIF 04/25/20, CAD, DM Type ll, HTN, sleep apnea, bilateral cataract sx, and R THA (2020).    PT Comments    Patient progressing towards physical therapy goals. Patient ambulated 200' with RW and supervision, cues for upright posture. Patient negotiated 2 stairs with B handrails and supervision. Reviewed back precautions with patient. Patient continues to be limited by fatigue and pain. No PT follow up recommended at this time.     Follow Up Recommendations  No PT follow up;Supervision - Intermittent     Equipment Recommendations  None recommended by PT    Recommendations for Other Services       Precautions / Restrictions Precautions Precautions: Fall;Back Precaution Booklet Issued: No Precaution Comments: reviewed back precautions Required Braces or Orthoses:  (no brace needed per orders) Restrictions Weight Bearing Restrictions: No    Mobility  Bed Mobility Overal bed mobility: Needs Assistance Bed Mobility: Rolling;Sidelying to Sit Rolling: Supervision Sidelying to sit: Supervision;HOB elevated       General bed mobility comments: Increased time/effort and use of rail, good demo of log roll    Transfers Overall transfer level: Needs assistance Equipment used: Rolling Kazuki Ingle (2 wheeled) Transfers: Sit to/from Stand Sit to Stand: Supervision         General transfer comment: increased time needed  Ambulation/Gait Ambulation/Gait assistance: Supervision Gait Distance (Feet): 200 Feet Assistive device: Rolling Prescilla Monger (2 wheeled) Gait Pattern/deviations: Step-through pattern;Decreased stride length;Trunk flexed Gait velocity: decreased   General Gait Details: slow, guarded gait with RW support, cues for upright  posture as patient tends to flex with fatigue.   Stairs Stairs: Yes Stairs assistance: Supervision Stair Management: Two rails;Step to pattern;Forwards Number of Stairs: 2     Wheelchair Mobility    Modified Rankin (Stroke Patients Only)       Balance Overall balance assessment: Needs assistance Sitting-balance support: No upper extremity supported;Feet supported Sitting balance-Leahy Scale: Fair     Standing balance support: Bilateral upper extremity supported;During functional activity Standing balance-Leahy Scale: Poor Standing balance comment: Use of RW for support.                            Cognition Arousal/Alertness: Awake/alert Behavior During Therapy: WFL for tasks assessed/performed Overall Cognitive Status: Within Functional Limits for tasks assessed                                        Exercises      General Comments        Pertinent Vitals/Pain Pain Assessment: Faces Faces Pain Scale: Hurts a little bit Pain Location: back, LLE Pain Descriptors / Indicators: Grimacing;Burning;Operative site guarding;Sore Pain Intervention(s): Monitored during session;Repositioned    Home Living                      Prior Function            PT Goals (current goals can now be found in the care plan section) Acute Rehab PT Goals Patient Stated Goal: less pain PT Goal Formulation: With patient Time For Goal Achievement: 06/02/20 Potential to Achieve Goals: Good Progress towards PT  goals: Progressing toward goals    Frequency    Min 5X/week      PT Plan Current plan remains appropriate    Co-evaluation              AM-PAC PT "6 Clicks" Mobility   Outcome Measure  Help needed turning from your back to your side while in a flat bed without using bedrails?: A Little Help needed moving from lying on your back to sitting on the side of a flat bed without using bedrails?: A Little Help needed moving to and  from a bed to a chair (including a wheelchair)?: A Little Help needed standing up from a chair using your arms (e.g., wheelchair or bedside chair)?: A Little Help needed to walk in hospital room?: A Little Help needed climbing 3-5 steps with a railing? : A Little 6 Click Score: 18    End of Session   Activity Tolerance: Patient tolerated treatment well Patient left: in chair;with call bell/phone within reach;with chair alarm set Nurse Communication: Mobility status PT Visit Diagnosis: Other abnormalities of gait and mobility (R26.89);Pain     Time: 7342-8768 PT Time Calculation (min) (ACUTE ONLY): 15 min  Charges:  $Therapeutic Activity: 8-22 mins                     Engelbert Sevin A. Dan Humphreys PT, DPT Acute Rehabilitation Services Pager [phone] Office [phone]    Viviann Spare 05/28/2020, 4:51 PM

## 2020-05-28 NOTE — Progress Notes (Signed)
Pharmacy Antibiotic Note  April Shaffer is a 70 y.o. female admitted on 05/24/2020 with post-op wound infection (and purulent wound drainage) involving subfascial deep tissues.  Pt is S/P lumbar fusion on 04/25/20; hardware and grafts in good position. She underwent I&D of lumbar wound infection today. Pharmacy has been consulted for vancomycin dosing for wound infection.  Antibiotics for post-op lumbar wound infection. n - s/p surgery 2/28. Grafts/hardware in place.  3/30 lumbar wound I&D Afebrile, WBC 12.5>14.8K  Height: 63 inches Weight: 72.5 kg  Plan: Continue Vancomycin 750 mg IV Q 12 hrs Monitor WBC, temp, clinical improvement, renal function, cultures F/u lumbar cx staph aureus sensitivities Will check Vanc peak and trough 4/3 AM.    Temp (24hrs), Avg:97.9 F (36.6 C), Min:97.3 F (36.3 C), Max:98.8 F (37.1 C)  Recent Labs  Lab 05/24/20 1748 05/27/20 0357 05/28/20 0024  WBC 13.1* 12.5* 14.8*  CREATININE 1.03* 0.78 0.64    Estimated Creatinine Clearance: 65.1 mL/min (by C-G formula based on SCr of 0.64 mg/dL).    Allergies  Allergen Reactions  . Lisinopril-Hydrochlorothiazide Other (See Comments)    Angioedema  - Tongue swelling   . Codeine Itching  . Oxycodone Itching  . Simvastatin Other (See Comments)    Muscle pain  . Tramadol Itching    Antimicrobials this admission: Cefazolin X 1 pre op on 3/30 Vancomycin 1 gm IV X 1 pre op at 1738 PM today Ceftriaxone 3/30 >> Rifampin PO 3/30 >> Vancomycin 3/30 >>  Microbiology results: 3/29 MRSA PCR >> positive 3/29 Covid-19: negative 3/30 Lumbar wound (1) >> abundant staph aureus 3/30 Lumbar wound (2) >> mod Staph aureus 3/31 BCx x 1 : ngtd x2d 3/31 BCx x 1: ngtd x 2d  Thank you for allowing pharmacy to be a part of this patient's care.  Noah Delaine, RPh Clinical Pharmacist [phone] Please check AMION for all [facility] Pharmacy phone numbers After 10:00 PM, call Main Pharmacy [phone] 05/28/2020 11:25 AM

## 2020-05-29 LAB — CBC WITH DIFFERENTIAL/PLATELET
Abs Immature Granulocytes: 0 10*3/uL (ref 0.00–0.07)
Basophils Absolute: 0 10*3/uL (ref 0.0–0.1)
Basophils Relative: 0 %
Eosinophils Absolute: 0.1 10*3/uL (ref 0.0–0.5)
Eosinophils Relative: 1 %
HCT: 28.7 % — ABNORMAL LOW (ref 36.0–46.0)
Hemoglobin: 9.6 g/dL — ABNORMAL LOW (ref 12.0–15.0)
Lymphocytes Relative: 10 %
Lymphs Abs: 1.3 10*3/uL (ref 0.7–4.0)
MCH: 27.2 pg (ref 26.0–34.0)
MCHC: 33.4 g/dL (ref 30.0–36.0)
MCV: 81.3 fL (ref 80.0–100.0)
Monocytes Absolute: 0.5 10*3/uL (ref 0.1–1.0)
Monocytes Relative: 4 %
Neutro Abs: 11.2 10*3/uL — ABNORMAL HIGH (ref 1.7–7.7)
Neutrophils Relative %: 85 %
Platelets: 379 10*3/uL (ref 150–400)
RBC: 3.53 MIL/uL — ABNORMAL LOW (ref 3.87–5.11)
RDW: 16.9 % — ABNORMAL HIGH (ref 11.5–15.5)
WBC: 13.2 10*3/uL — ABNORMAL HIGH (ref 4.0–10.5)
nRBC: 0 % (ref 0.0–0.2)
nRBC: 0 /100 WBC

## 2020-05-29 LAB — GLUCOSE, CAPILLARY
Glucose-Capillary: 84 mg/dL (ref 70–99)
Glucose-Capillary: 88 mg/dL (ref 70–99)
Glucose-Capillary: 163 mg/dL — ABNORMAL HIGH (ref 70–99)
Glucose-Capillary: 91 mg/dL (ref 70–99)

## 2020-05-29 LAB — BASIC METABOLIC PANEL
Anion gap: 6 (ref 5–15)
BUN: 5 mg/dL — ABNORMAL LOW (ref 8–23)
CO2: 19 mmol/L — ABNORMAL LOW (ref 22–32)
Calcium: 8.3 mg/dL — ABNORMAL LOW (ref 8.9–10.3)
Chloride: 107 mmol/L (ref 98–111)
Creatinine, Ser: 0.62 mg/dL (ref 0.44–1.00)
GFR, Estimated: >60 mL/min (ref >60)
Glucose, Bld: 86 mg/dL (ref 70–99)
Potassium: 4.2 mmol/L (ref 3.5–5.1)
Sodium: 132 mmol/L — ABNORMAL LOW (ref 135–145)

## 2020-05-29 LAB — VANCOMYCIN, TROUGH: Vancomycin Tr: 15 ug/mL (ref 15–20)

## 2020-05-29 LAB — VANCOMYCIN, PEAK: Vancomycin Pk: 20 ug/mL — ABNORMAL LOW (ref 30–40)

## 2020-05-29 MED ORDER — VANCOMYCIN HCL 10 G IV SOLR
1750.0000 mg | INTRAVENOUS | Status: DC
  Filled 2020-05-29: qty 1750

## 2020-05-29 MED ORDER — VANCOMYCIN HCL 750 MG/150ML IV SOLN
750.0000 mg | Freq: Two times a day (BID) | INTRAVENOUS | Status: DC
  Administered 2020-05-29 – 2020-05-30 (×2): 750 mg via INTRAVENOUS
  Filled 2020-05-29 (×3): qty 150

## 2020-05-29 NOTE — Progress Notes (Signed)
NEUROSURGERY PROGRESS NOTE  Doing well. Complains of appropriate back soreness. No numbness, tingling or weakness Ambulating and voiding well, walked to the bathroom unassisted yesterday with just her walker. Good strength and sensation Incision CDI  Temp:  [97.5 F (36.4 C)-98.3 F (36.8 C)] 97.5 F (36.4 C) (04/03 0753) Pulse Rate:  [75-85] 75 (04/03 0753) Resp:  [16-20] 18 (04/03 0753) BP: (135-160)/(59-78) 160/68 (04/03 0753) SpO2:  [100 %] 100 % (04/03 0753)  Plan: Continue therapies and IV abx for now. Awaiting blood cultures for picc line placement. MRI abdomen showed benign renal cyst.   Sherryl Manges, NP 05/29/2020 10:00 AM

## 2020-05-29 NOTE — Progress Notes (Signed)
Pharmacy Antibiotic Note  April Shaffer is a 70 y.o. female admitted on 05/24/2020 with post-op wound infection (and purulent wound drainage) involving subfascial deep tissues.  Pt is S/P lumbar fusion on 04/25/20; hardware and grafts in good position. She underwent I&D of lumbar wound infection today. Pharmacy has been consulted for vancomycin dosing for wound infection.  Antibiotics for post-op lumbar wound infection.  - s/p surgery 2/28. Grafts/hardware in place.  3/30 lumbar wound I&D Afebrile, WBC 13.88K Lumbar wound culture grew MRSA.  We checked steady state vancomycin peak and trough today. 4/3 peak =20 (~2h post),  Trough= 15 on 750mg  q12h> calculated  AUC = 431.  Therapeutic AUC. Goal AUC = 400 -550   Height: 63 inches Weight: 72.5 kg  Goal:  Vancomycin AUC : 400-550  Plan: Continue Vancomycin 750 mg IV Q12 hrs Monitor WBC, temp, clinical improvement, renal function, cultures F/u lumbar cx staph aureus sensitivities check Vanc peak and trough per protocol   Temp (24hrs), Avg:97.9 F (36.6 C), Min:97.5 F (36.4 C), Max:98.2 F (36.8 C)  Recent Labs  Lab 05/24/20 1748 05/27/20 0357 05/28/20 0024 05/29/20 0108 05/29/20 1029  WBC 13.1* 12.5* 14.8* 13.2*  --   CREATININE 1.03* 0.78 0.64 0.62  --   VANCOTROUGH  --   --   --   --  15  VANCOPEAK  --   --   --  20*  --     Estimated Creatinine Clearance: 65.1 mL/min (by C-G formula based on SCr of 0.62 mg/dL).    Allergies  Allergen Reactions  . Lisinopril-Hydrochlorothiazide Other (See Comments)    Angioedema  - Tongue swelling   . Codeine Itching  . Oxycodone Itching  . Simvastatin Other (See Comments)    Muscle pain  . Tramadol Itching    Antimicrobials this admission: Cefazolin X 1 pre op on 3/30 Vancomycin 1 gm IV X 1 pre op at 1738 PM today Ceftriaxone 3/30 >> Rifampin PO 3/30 >> Vancomycin 3/30 >>  Microbiology results: 3/29 MRSA PCR >> positive 3/29 Covid-19: negative 3/30 Lumbar wound (1)  >> MRSA (also resistant to TCN, clindanycin) 3/30 Lumbar wound (2) >> mod Staph aureus 3/31 BCx x 1 : ngtd x2d 3/31 BCx x 1: ngtd x 2d  Thank you for allowing pharmacy to be a part of this patient's care.  4/31, RPh Clinical Pharmacist [phone] Please check AMION for all [facility] Pharmacy phone numbers After 10:00 PM, call Main Pharmacy [phone] 05/29/2020 12:06 PM

## 2020-05-30 ENCOUNTER — Other Ambulatory Visit (HOSPITAL_COMMUNITY): Payer: Self-pay

## 2020-05-30 ENCOUNTER — Inpatient Hospital Stay: Payer: Self-pay

## 2020-05-30 DIAGNOSIS — T8149XA Infection following a procedure, other surgical site, initial encounter: Secondary | ICD-10-CM

## 2020-05-30 DIAGNOSIS — B9562 Methicillin resistant Staphylococcus aureus infection as the cause of diseases classified elsewhere: Secondary | ICD-10-CM | POA: Diagnosis not present

## 2020-05-30 LAB — GLUCOSE, CAPILLARY
Glucose-Capillary: 85 mg/dL (ref 70–99)
Glucose-Capillary: 104 mg/dL — ABNORMAL HIGH (ref 70–99)
Glucose-Capillary: 95 mg/dL (ref 70–99)
Glucose-Capillary: 97 mg/dL (ref 70–99)

## 2020-05-30 LAB — AEROBIC/ANAEROBIC CULTURE W GRAM STAIN (SURGICAL/DEEP WOUND)

## 2020-05-30 LAB — CK: Total CK: 21 U/L — ABNORMAL LOW (ref 38–234)

## 2020-05-30 MED ORDER — SODIUM CHLORIDE 0.9% FLUSH: 10.0000 mL | INTRAVENOUS | Status: DC | PRN

## 2020-05-30 MED ORDER — SODIUM CHLORIDE 0.9 % IV SOLN
9.0000 mg/kg | Freq: Every day | INTRAVENOUS | Status: DC
  Administered 2020-05-30 – 2020-06-01 (×3): 650 mg via INTRAVENOUS
  Filled 2020-05-30 (×4): qty 13

## 2020-05-30 MED ORDER — CHLORHEXIDINE GLUCONATE CLOTH 2 % EX PADS
6.0000 | MEDICATED_PAD | Freq: Every day | CUTANEOUS | Status: DC
  Administered 2020-05-30 – 2020-06-01 (×3): 6 via TOPICAL

## 2020-05-30 NOTE — TOC Benefit Eligibility Note (Addendum)
Patient Product/process development scientist completed.    The patient is currently admitted and upon discharge could be taking Daptomycin 650 mg (Using Daptomycin 500 mg) .  The current 30 day co-pay is, $1.35.   The patient is insured through [facility] Medicare Part D   Roland Earl, CPhT Pharmacy Patient Advocate Specialist [facility] Antimicrobial Stewardship Team Direct Number: [phone]  Fax: [phone]

## 2020-05-30 NOTE — Progress Notes (Signed)
Pharmacy Antibiotic Note  April Shaffer is a 70 y.o. female admitted on 05/24/2020 with post-op wound infection (and purulent wound drainage) involving subfascial deep tissues.  Pt is S/P lumbar fusion on 04/25/20; hardware and grafts in good position. She underwent I&D of lumbar wound infection 3/30. Pharmacy has been consulted for daptomycin dosing for surgical wound infection.  Antibiotics for post-op lumbar wound infection. - s/p surgery 2/28. Grafts/hardware in place.  - 3/30 lumbar wound I&D - Afebrile, WBC 13.2 K  Height: 63 inches Weight: 72.5 kg  Plan: Dicontinue Vancomycin Check baseline CK now, then weekly Start daptomycin 650 mg (9 mg/kg) every 24 hours Monitor WBC, temp, clinical improvement, renal function, cultures Please see note from Roland Earl about insurance/copay information   Temp (24hrs), Avg:97.9 F (36.6 C), Min:97.6 F (36.4 C), Max:98.4 F (36.9 C)  Recent Labs  Lab 05/24/20 1748 05/27/20 0357 05/28/20 0024 05/29/20 0108 05/29/20 1029  WBC 13.1* 12.5* 14.8* 13.2*  --   CREATININE 1.03* 0.78 0.64 0.62  --   VANCOTROUGH  --   --   --   --  15  VANCOPEAK  --   --   --  20*  --     Estimated Creatinine Clearance: 65.1 mL/min (by C-G formula based on SCr of 0.62 mg/dL).    Allergies  Allergen Reactions  . Lisinopril-Hydrochlorothiazide Other (See Comments)    Angioedema  - Tongue swelling   . Codeine Itching  . Oxycodone Itching  . Simvastatin Other (See Comments)    Muscle pain  . Tramadol Itching    Antimicrobials this admission: Cefazolin X 1 pre op on 3/30 Ceftriaxone 3/30 x 1 Rifampin PO 3/30 >> Vancomycin 3/30 >>4/4 Daptomycin 4/4 >>  Microbiology results: 3/29 MRSA PCR >> positive 3/29 Covid-19: negative 3/30 Lumbar wound (1) >> abundant staph aureus 3/30 Lumbar wound (2) >> mod Staph aureus 3/31 BCx x 1 : ngtd 3/31 BCx x 1: ngtd    Thank you for allowing [place] to participate in this patients care. Signe Colt,  PharmD 05/30/2020 1:37 PM  Please check [email or web address] for unit-specific pharmacy phone numbers.

## 2020-05-30 NOTE — Consult Note (Signed)
[facility]    Date of Admission:  05/24/2020     Total days of antibiotics 6               Reason for Consult: Wound infection  Referring Provider: Dr. Yetta Barre Primary Care Provider: Raymon Mutton., FNP   ASSESSMENT:  April Shaffer is a 71 y/o female with post-operative wound infection s/p I&D with hemovac placement with concern for hardware infection following lumbar fusion of L1-L5 with hardware placement on 2/28. Surgical cultures are positive for MRSA. Day 6 of vancomycin and rifampin and appears to be tolerating antibiotics with no significant adverse side effects. Blood cultures have remained without growth to date and safe to place PICC line as she will need a prolonged course of 6 weeks of antimicrobial therapy using date of surgery 3/20 as start placing end date of 07/06/20. Continue with vancomycin and rifampin. Pharmacy checking affordability of daptomycin. Wound care per Neurosurgery. Diabetes appears well controlled with most recent A1c of 6.4.   PLAN:  1. Continue vancomycin and rifampin.  2. Therapeutic monitoring of renal function and vancomycin levels per protocol.  3. PICC line placement. 4. Wound care per Neurosurgery.  5. Continue diabetes management per primary team.    Active Problems:   Wound drainage   Wound infection after surgery   . allopurinol  300 mg Oral Daily  . amLODipine  10 mg Oral Daily  . aspirin EC  81 mg Oral Daily  . carvedilol  20 mg Oral Daily  . ezetimibe  10 mg Oral Daily  . ferrous sulfate  325 mg Oral Q breakfast  . gabapentin  300 mg Oral 2 times per day  . gabapentin  600 mg Oral QHS  . insulin aspart  0-15 Units Subcutaneous TID WC  . metFORMIN  500 mg Oral BID WC  . rifampin  600 mg Oral Daily  . senna  1 tablet Oral BID  . sodium chloride flush  3 mL Intravenous Q12H     HPI: April Shaffer is a 70 y.o. female with previous medical history of sleep apnea, hypertension, and Type 2 diabetes  admitted on 3/29 for lumbar wound infection.   April Shaffer underwent posterior lumbar interbody fusion of L1-L5 with cortical pedical screws on 04/25/20. Underwent routine hospital course without complications and discharged on 3/2. Admited on 3/29 with progressive back pain and acute onset drainage starting on 3/28. Brought for I&D on 3/30. On incision there was immediate release of exudative fluid which were sent for cultures with purulence going below the fascia and was subfascial. Surgical specimens with gram stain with gram positive cocci and cultures positive for MRSA. Started on vancomycin and rifampin for deep wound space infection.   April Shaffer was febrile on admission and has been afebrile since. Now POD #5 and blood cultures dran on 3/31 are without growth to date.   Review of Systems: Review of Systems  Constitutional: Negative for chills, fever and weight loss.  Respiratory: Negative for cough, shortness of breath and wheezing.   Cardiovascular: Negative for chest pain and leg swelling.  Gastrointestinal: Negative for abdominal pain, constipation, diarrhea, nausea and vomiting.  Musculoskeletal: Negative for back pain.  Skin: Negative for rash.     Past Medical History:  Diagnosis Date  . Arthritis   . Carotid artery narrowing   . Coronary artery disease    Cardiac catheterization November 2013: 50% ostial LAD stenosis 50% mid stenosis.  30% disease in the left circumflex.  . Diabetes mellitus, type 2 (HCC)   . Hyperlipidemia   . Hypertension   . Onychomycosis of toenail 07/31/2016  . Sleep apnea       Had surgery to correct    Social History   Tobacco Use  . Smoking status: Current Every Day Smoker    Packs/day: 0.10    Years: 25.00    Pack years: 2.50    Types: Cigarettes    Start date: 05/31/1968  . Smokeless tobacco: Never Used  . Tobacco comment: pt states she smokes about 4-5 cigs per day--states she contiues to work on it.  No interest in other help now.   Vaping Use  . Vaping Use: Never used  Substance Use Topics  . Alcohol use: Yes    Comment: rare  . Drug use: No    Family History  Problem Relation Age of Onset  . Hypertension Sister   . Hypothyroidism Sister   . Cancer Maternal Grandmother        lung  . Hypertension Son   . Hypertension Sister   . Diabetes Brother        lost toe in 2018 with new diagnosis of DM  . Hypertension Son     Allergies  Allergen Reactions  . Lisinopril-Hydrochlorothiazide Other (See Comments)    Angioedema  - Tongue swelling   . Codeine Itching  . Oxycodone Itching  . Simvastatin Other (See Comments)    Muscle pain  . Tramadol Itching    OBJECTIVE: Blood pressure (!) 162/78, pulse 75, temperature 97.9 F (36.6 C), temperature source Oral, resp. rate 16, height 5\' 3"  (1.6 m), weight 72.5 kg, SpO2 100 %.  Physical Exam Constitutional:      General: She is not in acute distress.    Appearance: She is well-developed.     Comments: Lying in bed with head of bed flat in right lateral decubitus position; pleasant.   Cardiovascular:     Rate and Rhythm: Normal rate and regular rhythm.     Heart sounds: Normal heart sounds.  Pulmonary:     Effort: Pulmonary effort is normal.     Breath sounds: Normal breath sounds.  Musculoskeletal:     Comments: Lumbar hemovac present and charged.   Skin:    General: Skin is warm and dry.  Neurological:     Mental Status: She is alert and oriented to person, place, and time.  Psychiatric:        Behavior: Behavior normal.        Thought Content: Thought content normal.        Judgment: Judgment normal.     Lab Results Lab Results  Component Value Date   WBC 13.2 (H) 05/29/2020   HGB 9.6 (L) 05/29/2020   HCT 28.7 (L) 05/29/2020   MCV 81.3 05/29/2020   PLT 379 05/29/2020    Lab Results  Component Value Date   CREATININE 0.62 05/29/2020   BUN 5 (L) 05/29/2020   NA 132 (L) 05/29/2020   K 4.2 05/29/2020   CL 107 05/29/2020   CO2 19 (L)  05/29/2020    Lab Results  Component Value Date   ALT 18 08/04/2019   AST 17 08/04/2019   ALKPHOS 109 08/04/2019   BILITOT 0.6 08/04/2019     Microbiology: Recent Results (from the past 240 hour(s))  SARS CORONAVIRUS 2 (TAT 6-24 HRS) Nasopharyngeal Nasopharyngeal Swab     Status: None   Collection Time: 05/24/20  5:41 PM   Specimen: Nasopharyngeal Swab  Result Value Ref Range Status   SARS Coronavirus 2 NEGATIVE NEGATIVE Final    Comment: (NOTE) SARS-CoV-2 target nucleic acids are NOT DETECTED.  The SARS-CoV-2 RNA is generally detectable in upper and lower respiratory specimens during the acute phase of infection. Negative results do not preclude SARS-CoV-2 infection, do not rule out co-infections with other pathogens, and should not be used as the sole basis for treatment or other patient management decisions. Negative results must be combined with clinical observations, patient history, and epidemiological information. The expected result is Negative.  Fact Sheet for Patients: HairSlick.no  Fact Sheet for Healthcare Providers: quierodirigir.com  This test is not yet approved or cleared by the [place] FDA and  has been authorized for detection and/or diagnosis of SARS-CoV-2 by FDA under an Emergency Use Authorization (EUA). This EUA will remain  in effect (meaning this test can be used) for the duration of the COVID-19 declaration under Se ction 564(b)(1) of the Act, 21 U.S.C. section 360bbb-3(b)(1), unless the authorization is terminated or revoked sooner.  Performed at [facility], [address], [place], [place] [address]   Surgical PCR screen     Status: Abnormal   Collection Time: 05/24/20 11:08 PM   Specimen: Nasal Mucosa; Nasal Swab  Result Value Ref Range Status   MRSA, PCR POSITIVE (A) NEGATIVE Final    Comment: RESULT CALLED TO, READ BACK BY AND VERIFIED WITH: R SEWELL RN 05/25/20 6045  JDW    Staphylococcus aureus POSITIVE (A) NEGATIVE Final    Comment: (NOTE) The Xpert SA Assay (FDA approved for NASAL specimens in patients 23 years of age and older), is one component of a comprehensive surveillance program. It is not intended to diagnose infection nor to guide or monitor treatment. Performed at [facility], [address], [place], [place] [address]   Aerobic/Anaerobic Culture w Gram Stain (surgical/deep wound)     Status: None   Collection Time: 05/25/20  5:05 PM   Specimen: Wound  Result Value Ref Range Status   Specimen Description WOUND  Final   Special Requests LUMBAR INFECTION NO 1  Final   Gram Stain   Final    RARE WBC PRESENT, PREDOMINANTLY PMN FEW GRAM POSITIVE COCCI    Culture   Final    ABUNDANT METHICILLIN RESISTANT STAPHYLOCOCCUS AUREUS NO ANAEROBES ISOLATED Performed at [facility], [address] [place], [place] [address]    Report Status 05/30/2020 FINAL  Final   Organism ID, Bacteria METHICILLIN RESISTANT STAPHYLOCOCCUS AUREUS  Final      Susceptibility   Methicillin resistant staphylococcus aureus - MIC*    CIPROFLOXACIN >=8 RESISTANT Resistant     ERYTHROMYCIN >=8 RESISTANT Resistant     GENTAMICIN <=0.5 SENSITIVE Sensitive     OXACILLIN >=4 RESISTANT Resistant     TETRACYCLINE >=16 RESISTANT Resistant     VANCOMYCIN 1 SENSITIVE Sensitive     TRIMETH/SULFA <=10 SENSITIVE Sensitive     CLINDAMYCIN >=8 RESISTANT Resistant     RIFAMPIN <=0.5 SENSITIVE Sensitive     Inducible Clindamycin NEGATIVE Sensitive     * ABUNDANT METHICILLIN RESISTANT STAPHYLOCOCCUS AUREUS  Aerobic/Anaerobic Culture w Gram Stain (surgical/deep wound)     Status: None (Preliminary result)   Collection Time: 05/25/20  5:15 PM   Specimen: Wound  Result Value Ref Range Status   Specimen Description WOUND  Final   Special Requests LUMBAR INFECTION NO 2  Final  Gram Stain   Final    RARE WBC PRESENT, PREDOMINANTLY PMN RARE GRAM POSITIVE  COCCI Performed at [facility], [address], [place], [place] [address]    Culture   Final    MODERATE STAPHYLOCOCCUS AUREUS SUSCEPTIBILITIES PERFORMED ON PREVIOUS CULTURE WITHIN THE LAST 5 DAYS. NO ANAEROBES ISOLATED; CULTURE IN PROGRESS FOR 5 DAYS    Report Status PENDING  Incomplete  Culture, blood (single)     Status: None (Preliminary result)   Collection Time: 05/26/20  8:07 AM   Specimen: BLOOD  Result Value Ref Range Status   Specimen Description BLOOD RIGHT ANTECUBITAL  Final   Special Requests   Final    BOTTLES DRAWN AEROBIC AND ANAEROBIC Blood Culture adequate volume   Culture   Final    NO GROWTH 3 DAYS Performed at [facility], [address] [place], [place] [address]    Report Status PENDING  Incomplete  Culture, blood (single)     Status: None (Preliminary result)   Collection Time: 05/26/20  9:11 AM   Specimen: BLOOD RIGHT FOREARM  Result Value Ref Range Status   Specimen Description BLOOD RIGHT FOREARM  Final   Special Requests   Final    BOTTLES DRAWN AEROBIC AND ANAEROBIC Blood Culture adequate volume   Culture   Final    NO GROWTH 3 DAYS Performed at [facility], [address] [place], [place] [address]    Report Status PENDING  Incomplete     Marcos Eke, NP [facility]  05/30/2020  10:52 AM

## 2020-05-30 NOTE — Progress Notes (Signed)
Subjective: Patient reports some back pain but doing ok   Objective: Vital signs in last 24 hours: Temp:  [97.7 F (36.5 C)-98.4 F (36.9 C)] 97.9 F (36.6 C) (04/04 0800) Pulse Rate:  [74-88] 75 (04/04 0800) Resp:  [16-20] 16 (04/04 0800) BP: (138-164)/(66-78) 162/78 (04/04 0800) SpO2:  [98 %-100 %] 100 % (04/04 0800)  Intake/Output from previous day: 04/03 0701 - 04/04 0700 In: 387 [P.O.:237; IV Piggyback:150] Out: 145 [Drains:145] Intake/Output this shift: Total I/O In: 3 [I.V.:3] Out: -   Neurologic: Grossly normal  Lab Results: Lab Results  Component Value Date   WBC 13.2 (H) 05/29/2020   HGB 9.6 (L) 05/29/2020   HCT 28.7 (L) 05/29/2020   MCV 81.3 05/29/2020   PLT 379 05/29/2020   Lab Results  Component Value Date   INR 1.0 04/21/2020   BMET Lab Results  Component Value Date   NA 132 (L) 05/29/2020   K 4.2 05/29/2020   CL 107 05/29/2020   CO2 19 (L) 05/29/2020   GLUCOSE 86 05/29/2020   BUN 5 (L) 05/29/2020   CREATININE 0.62 05/29/2020   CALCIUM 8.3 (L) 05/29/2020    Studies/Results: MR ABDOMEN W WO CONTRAST  Result Date: 05/29/2020 CLINICAL DATA:  Indeterminate renal masses on recent CT performed for abdominal pain. EXAM: MRI ABDOMEN WITHOUT AND WITH CONTRAST TECHNIQUE: Multiplanar multisequence MR imaging of the abdomen was performed both before and after the administration of intravenous contrast. CONTRAST:  77mL GADAVIST GADOBUTROL 1 MMOL/ML IV SOLN COMPARISON:  05/27/2020 CT abdomen/pelvis. FINDINGS: Lower chest: No acute abnormality at the lung bases. Hepatobiliary: Normal liver size and configuration. Mild diffuse hepatic steatosis. No liver mass. Normal gallbladder with no cholelithiasis. No biliary ductal dilatation. Common bile duct diameter 4 mm. No choledocholithiasis. No biliary masses, strictures or beading. Pancreas: No pancreatic mass or duct dilation.  No pancreas divisum. Spleen: Normal size. No mass. Adrenals/Urinary Tract: Normal adrenals.  No hydronephrosis. Minimally T1 hyperintense and T2 hyperintense 2.0 x 2.0 cm renal cortical lesion in the lateral lower left kidney (series 22/image 69) demonstrates no enhancement, compatible with a benign Bosniak category 2 hemorrhagic/proteinaceous renal cyst. Similar T1 isointense and T2 hyperintense 2.0 x 1.4 cm renal cortical lesion in the medial lower right kidney (series 22/image 77) demonstrates no enhancement, compatible with a benign Bosniak category 2 hemorrhagic/proteinaceous renal cyst. No suspicious renal masses. Multiple simple renal cortical cysts in both kidneys, largest 4.1 x 3.1 cm in the medial upper left kidney. Stomach/Bowel: Small hiatal hernia. Otherwise normal nondistended stomach. Visualized small and large bowel is normal caliber, with no bowel wall thickening. Vascular/Lymphatic: Normal caliber abdominal aorta. Patent portal, splenic, hepatic and renal veins. No pathologically enlarged lymph nodes in the abdomen. Other: No abdominal ascites or focal fluid collection. Musculoskeletal: No aggressive appearing focal osseous lesions. Bilateral posterior spinal fusion hardware throughout the lumbar spine. IMPRESSION: 1. No acute abnormality. Benign Bosniak category 1 and category 2 renal cysts. No suspicious renal masses. 2. Mild diffuse hepatic steatosis. 3. Small hiatal hernia. Electronically Signed   By: Delbert Phenix M.D.   On: 05/29/2020 05:54    Assessment/Plan: Consulted ID for assistance in infection management. Awaiting final blood cultures for PICC line placement.    LOS: 6 days    Tiana Loft [facility] 05/30/2020, 10:44 AM

## 2020-05-30 NOTE — Progress Notes (Signed)
Occupational Therapy Treatment Patient Details Name: April Shaffer MRN: [id number] DOB: December 14, 1950 Today's Date: 05/30/2020    History of present illness Pt is a 70 yo female presenting for lumbar wound infection, s/p I&D lumbar wound 3/30. PMH  L2-5 PLIF 04/25/20, CAD, DM Type ll, HTN, sleep apnea, bilateral cataract sx, and R THA (2020).   OT comments  Pt making good progress with functional goals. OT will continue to follow acutely to maximize level of function and safety  Follow Up Recommendations  No OT follow up;Supervision - Intermittent    Equipment Recommendations  None recommended by OT    Recommendations for Other Services      Precautions / Restrictions Precautions Precautions: Fall;Back Precaution Comments: reviewed back precautions Restrictions Weight Bearing Restrictions: No       Mobility Bed Mobility Overal bed mobility: Needs Assistance Bed Mobility: Rolling;Sidelying to Sit Rolling: Supervision Sidelying to sit: Supervision;HOB elevated     Sit to sidelying: Min assist General bed mobility comments: Increased time/effort and use of rail, min A with LEs back onto bed    Transfers Overall transfer level: Needs assistance Equipment used: Rolling walker (2 wheeled) Transfers: Sit to/from Stand Sit to Stand: Supervision              Balance Overall balance assessment: Needs assistance Sitting-balance support: No upper extremity supported;Feet supported Sitting balance-Leahy Scale: Fair     Standing balance support: Bilateral upper extremity supported;During functional activity Standing balance-Leahy Scale: Poor                             ADL either performed or assessed with clinical judgement   ADL Overall ADL's : Needs assistance/impaired     Grooming: Set up;Standing;Wash/dry hands;Wash/dry face;Oral care;Supervision/safety               Lower Body Dressing: Moderate assistance;Sit to/from stand   Toilet  Transfer: Min guard;Supervision/safety;Ambulation;RW;Comfort height toilet;Grab bars;Cueing for safety   Toileting- Clothing Manipulation and Hygiene: Sit to/from stand;Min guard       Functional mobility during ADLs: Minimal assistance;Min guard;Rolling walker;Cueing for safety       Vision Patient Visual Report: No change from baseline     Perception     Praxis      Cognition Arousal/Alertness: Awake/alert Behavior During Therapy: WFL for tasks assessed/performed Overall Cognitive Status: Within Functional Limits for tasks assessed                                          Exercises     Shoulder Instructions       General Comments      Pertinent Vitals/ Pain       Pain Assessment: Faces Faces Pain Scale: Hurts a little bit Pain Location: back, LLE Pain Descriptors / Indicators: Grimacing;Burning;Operative site guarding;Sore Pain Intervention(s): Monitored during session;Limited activity within patient's tolerance  Home Living                                          Prior Functioning/Environment              Frequency  Min 2X/week        Progress Toward Goals  OT Goals(current goals can now be found in the care  plan section)  Progress towards OT goals: Progressing toward goals  Acute Rehab OT Goals Patient Stated Goal: go home  Plan Discharge plan remains appropriate    Co-evaluation                 AM-PAC OT "6 Clicks" Daily Activity     Outcome Measure   Help from another person eating meals?: None Help from another person taking care of personal grooming?: None Help from another person toileting, which includes using toliet, bedpan, or urinal?: A Little Help from another person bathing (including washing, rinsing, drying)?: A Little Help from another person to put on and taking off regular upper body clothing?: A Little Help from another person to put on and taking off regular lower body  clothing?: A Lot 6 Click Score: 19    End of Session Equipment Utilized During Treatment: Rolling walker  OT Visit Diagnosis: Other abnormalities of gait and mobility (R26.89);Pain Pain - part of body:  (back)   Activity Tolerance Patient tolerated treatment well   Patient Left in bed;with call bell/phone within reach   Nurse Communication          Time: 0762-2633 OT Time Calculation (min): 26 min  Charges: OT General Charges $OT Visit: 1 Visit OT Treatments $Self Care/Home Management : 8-22 mins $Therapeutic Activity: 8-22 mins     Galen Manila 05/30/2020, 2:37 PM

## 2020-05-30 NOTE — Progress Notes (Signed)
Peripherally Inserted Central Catheter Placement  The IV Nurse has discussed with the patient and/or persons authorized to consent for the patient, the purpose of this procedure and the potential benefits and risks involved with this procedure.  The benefits include less needle sticks, lab draws from the catheter, and the patient may be discharged home with the catheter. Risks include, but not limited to, infection, bleeding, blood clot (thrombus formation), and puncture of an artery; nerve damage and irregular heartbeat and possibility to perform a PICC exchange if needed/ordered by physician.  Alternatives to this procedure were also discussed.  Bard Power PICC patient education guide, fact sheet on infection prevention and patient information card has been provided to patient /or left at bedside.    PICC Placement Documentation  PICC Single Lumen 05/30/20 PICC Right Brachial 36 cm 0 cm (Active)  Indication for Insertion or Continuance of Line Home intravenous therapies (PICC only) 05/30/20 2111  Exposed Catheter (cm) 0 cm 05/30/20 2111  Site Assessment Clean;Dry;Intact 05/30/20 2111  Line Status Capped (central line);Flushed;Blood return noted 05/30/20 2111  Dressing Type Transparent;Occlusive 05/30/20 2111  Dressing Status Clean;Dry;Intact 05/30/20 2111  Antimicrobial disc in place? Yes 05/30/20 2111  Safety Lock Not Applicable 05/30/20 2111  Line Care Connections checked and tightened 05/30/20 2111  Line Adjustment (NICU/IV Team Only) No 05/30/20 2111  Dressing Intervention New dressing 05/30/20 2111  Dressing Change Due 06/06/20 05/30/20 2111       Burnard Bunting [place] 05/30/2020, 9:12 PM

## 2020-05-30 NOTE — Progress Notes (Signed)
Physical Therapy Treatment Patient Details Name: April Shaffer MRN: [id number] DOB: August 27, 1950 Today's Date: 05/30/2020    History of Present Illness Pt is a 70 yo female presenting for lumbar wound infection, s/p I&D lumbar wound 3/30. PMH  L2-5 PLIF 04/25/20, CAD, DM Type ll, HTN, sleep apnea, bilateral cataract sx, and R THA (2020).    PT Comments    Pt complaining of back and R hip/thigh pain, but agreeable to OOB mobility with PT encouragement. Pt ambulatory in hallway with use of RW and supervision level of assist, complains of increased R hip/groin/thigh pain during mobility. Pt may benefit from OPPT when cleared by surgeon, given pt's strength and mobility deficits. PT to continue to follow acutely.      Follow Up Recommendations  No PT follow up;Supervision - Intermittent     Equipment Recommendations  None recommended by PT    Recommendations for Other Services       Precautions / Restrictions Precautions Precautions: Fall;Back Precaution Comments: reviewed back precautions Required Braces or Orthoses: Other Brace Other Brace: no brace needed Restrictions Weight Bearing Restrictions: No    Mobility  Bed Mobility Overal bed mobility: Needs Assistance Bed Mobility: Rolling;Sidelying to Sit;Sit to Sidelying Rolling: Supervision Sidelying to sit: Supervision     Sit to sidelying: Supervision General bed mobility comments: for safety, increased time and effort with min cues for sequencing task.    Transfers Overall transfer level: Needs assistance Equipment used: Rolling walker (2 wheeled) Transfers: Sit to/from Stand Sit to Stand: Supervision         General transfer comment: for safety, increased time to rise.  Ambulation/Gait Ambulation/Gait assistance: Supervision Gait Distance (Feet): 180 Feet Assistive device: Rolling walker (2 wheeled) Gait Pattern/deviations: Step-through pattern;Decreased stride length;Trunk flexed Gait velocity: decr    General Gait Details: supervision for safety, verbal cuing for upright posture, placement in RW.   Stairs             Wheelchair Mobility    Modified Rankin (Stroke Patients Only)       Balance Overall balance assessment: Needs assistance Sitting-balance support: No upper extremity supported;Feet supported Sitting balance-Leahy Scale: Fair     Standing balance support: Bilateral upper extremity supported;During functional activity Standing balance-Leahy Scale: Poor Standing balance comment: Use of RW for support.                            Cognition Arousal/Alertness: Awake/alert Behavior During Therapy: WFL for tasks assessed/performed Overall Cognitive Status: Within Functional Limits for tasks assessed                                        Exercises      General Comments        Pertinent Vitals/Pain Pain Assessment: Faces Faces Pain Scale: Hurts little more Pain Location: back, RLE at level of hip and lateral thigh Pain Descriptors / Indicators: Grimacing;Burning;Operative site guarding;Sore Pain Intervention(s): Limited activity within patient's tolerance;Monitored during session;Repositioned;Premedicated before session    Home Living                      Prior Function            PT Goals (current goals can now be found in the care plan section) Acute Rehab PT Goals Patient Stated Goal: less pain PT Goal Formulation: With  patient Time For Goal Achievement: 06/02/20 Potential to Achieve Goals: Good Progress towards PT goals: Progressing toward goals    Frequency    Min 5X/week      PT Plan Current plan remains appropriate    Co-evaluation              AM-PAC PT "6 Clicks" Mobility   Outcome Measure  Help needed turning from your back to your side while in a flat bed without using bedrails?: A Little Help needed moving from lying on your back to sitting on the side of a flat bed without  using bedrails?: A Little Help needed moving to and from a bed to a chair (including a wheelchair)?: A Little Help needed standing up from a chair using your arms (e.g., wheelchair or bedside chair)?: A Little Help needed to walk in hospital room?: A Little Help needed climbing 3-5 steps with a railing? : A Little 6 Click Score: 18    End of Session   Activity Tolerance: Patient tolerated treatment well Patient left: with call bell/phone within reach;in bed;with bed alarm set;with family/visitor present Nurse Communication: Mobility status PT Visit Diagnosis: Other abnormalities of gait and mobility (R26.89);Pain     Time: 9628-3662 PT Time Calculation (min) (ACUTE ONLY): 18 min  Charges:  $Gait Training: 8-22 mins                     Marye Round, PT Acute Rehabilitation Services Pager [phone]  Office [phone]    Truddie Coco 05/30/2020, 4:17 PM

## 2020-05-30 NOTE — Plan of Care (Signed)

## 2020-05-31 DIAGNOSIS — T8149XA Infection following a procedure, other surgical site, initial encounter: Secondary | ICD-10-CM | POA: Diagnosis not present

## 2020-05-31 DIAGNOSIS — B9562 Methicillin resistant Staphylococcus aureus infection as the cause of diseases classified elsewhere: Secondary | ICD-10-CM | POA: Diagnosis not present

## 2020-05-31 LAB — CULTURE, BLOOD (SINGLE)
Culture: NO GROWTH
Culture: NO GROWTH
Special Requests: ADEQUATE
Special Requests: ADEQUATE

## 2020-05-31 LAB — GLUCOSE, CAPILLARY
Glucose-Capillary: 89 mg/dL (ref 70–99)
Glucose-Capillary: 98 mg/dL (ref 70–99)
Glucose-Capillary: 91 mg/dL (ref 70–99)

## 2020-05-31 MED ORDER — OXYCODONE-ACETAMINOPHEN 5-325 MG PO TABS: 1.0000 | ORAL_TABLET | ORAL | Status: DC | PRN

## 2020-05-31 MED ORDER — DAPTOMYCIN IV (FOR PTA / DISCHARGE USE ONLY)
650.0000 mg | INTRAVENOUS | 0 refills | Status: DC
Start: 2020-05-31 — End: 2020-06-22

## 2020-05-31 MED ORDER — RIFAMPIN 300 MG PO CAPS: 600.0000 mg | ORAL_CAPSULE | Freq: Every day | ORAL | 1 refills | Status: DC

## 2020-05-31 MED ORDER — OXYCODONE-ACETAMINOPHEN 5-325 MG PO TABS
1.0000 | ORAL_TABLET | ORAL | Status: DC | PRN
  Administered 2020-06-01 (×2): 1 via ORAL
  Filled 2020-05-31 (×2): qty 1

## 2020-05-31 NOTE — TOC Initial Note (Addendum)
Transition of Care [facility]) - Initial/Assessment Note    Patient Details  Name: April Shaffer MRN: [id number] Date of Birth: 11-Oct-1950  Transition of Care [facility]) CM/SW Contact:    Kingsley Plan, RN Phone Number: 05/31/2020, 10:45 AM  Clinical Narrative:                 Prior [facility] Team member arranged HHPT/OT/RN with Kandee Keen with [facility] , and [facility] with Pam.   NCM spoke to patent at bedside today.   Explained she will have a HHRN but HHRN will not be present every time a dose is due. Pam with [facility] will come to room today and provide teaching to her and her niece. Patient voiced understanding, feels she can learn to administer antibiotic on her own.  Bedside nurse came in room. NCM asked bedside nurse to give today's dose prior to discharge.   Pam and [facility] aware discharge today.   Ordered walker and 3 in1 with Velna Hatchet with [facility]. 1555 OPAT script needs signature. Called DR Yetta Barre office and spoke to [place] , she will ask Dr Yetta Barre or Adelene Idler NP to sign  Expected Discharge Plan: Home w Home Health Services     Patient Goals and CMS Choice Patient states their goals for this hospitalization and ongoing recovery are:: to return to home CMS Medicare.gov Compare Post Acute Care list provided to:: Patient Choice offered to / list presented to : Patient  Expected Discharge Plan and Services Expected Discharge Plan: Home w Home Health Services   Discharge Planning Services: CM Consult Post Acute Care Choice: Home Health Living arrangements for the past 2 months: Single Family Home                 DME Arranged: 3-N-1,Walker rolling DME Agency: [facility] Date DME Agency Contacted: 05/31/20 Time DME Agency Contacted: 1043 Representative spoke with at DME Agency: Velna Hatchet HH Arranged: PT HH Agency: [facility] Date [facility] Agency Contacted: 05/31/20 Time HH Agency Contacted: 1043 Representative spoke with at [facility] Agency: Kandee Keen  Prior Living  Arrangements/Services Living arrangements for the past 2 months: Single Family Home Lives with:: Relatives Patient language and need for interpreter reviewed:: Yes Do you feel safe going back to the place where you live?: Yes      Need for Family Participation in Patient Care: Yes (Comment) Care giver support system in place?: Yes (comment)   Criminal Activity/Legal Involvement Pertinent to Current Situation/Hospitalization: No - Comment as needed  Activities of Daily Living      Permission Sought/Granted   Permission granted to share information with : No              Emotional Assessment Appearance:: Appears stated age Attitude/Demeanor/Rapport: Engaged Affect (typically observed): Accepting Orientation: : Oriented to Self,Oriented to Place,Oriented to  Time,Oriented to Situation Alcohol / Substance Use: Not Applicable Psych Involvement: No (comment)  Admission diagnosis:  Wound drainage [L24.A9] Wound infection after surgery [T81.49XA] Patient Active Problem List   Diagnosis Date Noted  . Wound infection after surgery 05/25/2020  . Wound drainage 05/24/2020  . S/P lumbar fusion 04/25/2020  . Microalbuminuria due to type 2 diabetes mellitus (HCC) 08/07/2019  . Primary localized osteoarthritis of right hip 08/22/2018  . Carpal tunnel syndrome, bilateral 08/06/2018  . Type 2 diabetes mellitus with peripheral neuropathy (HCC) 08/06/2018  . Degenerative joint disease of right hip 08/06/2018  . Preoperative clearance 03/26/2018  . Upper back pain on left side 10/30/2016  . Encounter for  postoperative carotid endarterectomy surveillance 12/07/2015  . Varicosities of leg 06/03/2015  . Acute left lumbar radiculopathy 04/15/2014  . Chronic sciatica of right side 03/17/2013  . Diabetic peripheral neuropathy (HCC) 03/17/2013  . Coronary artery disease   . Carotid artery stenosis s/p CEA 2013 10/16/2011  . Carotid artery occlusion without infarction, right 09/17/2011  .  Carotid bruit 08/28/2011  . Gout 04/13/2011  . DM type 2 with diabetic peripheral neuropathy (HCC) 04/02/2008  . Hyperlipidemia associated with type 2 diabetes mellitus (HCC) 04/02/2008  . Essential hypertension 04/02/2008  . DEGENERATIVE DISC DISEASE, LUMBOSACRAL SPINE 04/02/2008   PCP:  Raymon Mutton., FNP Pharmacy:   [facility] [address] [place] - [address] [place] [address] Phone: [phone] Fax: [phone]  [facility] - [place], [place] - [address] [place] [address] Phone: [phone] Fax: [phone]     Social Determinants of Health (SDOH) Interventions    Readmission Risk Interventions No flowsheet data found.

## 2020-05-31 NOTE — Progress Notes (Signed)
[facility] for Infectious Disease  Date of Admission:  05/24/2020     Total days of antibiotics 7         ASSESSMENT:  April Shaffer is doing well today and tolerating her Daptomycin. PICC line inserted. Remained afebrile without acute events. Discussed plan of care with Home Health for 6 weeks with Daptomycin IV and rifampin oral. OPAT orders placed and Home Health orders below. Will need to monitor CK levels while on daptomycin. Okay for discharge from ID perspective once Marty established.   PLAN:  1. Continue Daptomycin and rifampin. 2. CK levels while on Daptomycin for therapeutic drug monitoring.  3. Home Health / OPAT orders. 4. Wound care per Neurosurgery. 5. Arrange follow up in ID clinic.   Diagnosis: MRSA post operative wound infection complicated by hardware.   Culture Result: MRSA  Allergies  Allergen Reactions  . Lisinopril-Hydrochlorothiazide Other (See Comments)    Angioedema  - Tongue swelling   . Codeine Itching  . Oxycodone Itching  . Simvastatin Other (See Comments)    Muscle pain  . Tramadol Itching    OPAT Orders Discharge antibiotics to be given via PICC line Discharge antibiotics: Daptomycin and oral rifampin Per pharmacy protocol   Duration: 6 weeks  End Date: 07/06/20  [facility] Care Per Protocol:  Home health RN for IV administration and teaching; PICC line care and labs.    Labs weekly while on IV antibiotics: _X_ CBC with differential _X_ BMP __ CMP _X_ CRP _X_ ESR __ Vancomycin trough _X_ CK  __ Please pull PIC at completion of IV antibiotics _X_ Please leave PIC in place until doctor has seen patient or been notified  Fax weekly labs to [phone]  Clinic Follow Up Appt:  4/27 @ 2:45 pm with Dr. Linus Salmons   Active Problems:   Wound drainage   Wound infection after surgery   . allopurinol  300 mg Oral Daily  . amLODipine  10 mg Oral Daily  . aspirin EC  81 mg Oral Daily  . carvedilol  20 mg Oral Daily  .  Chlorhexidine Gluconate Cloth  6 each Topical Daily  . ezetimibe  10 mg Oral Daily  . ferrous sulfate  325 mg Oral Q breakfast  . gabapentin  300 mg Oral 2 times per day  . gabapentin  600 mg Oral QHS  . insulin aspart  0-15 Units Subcutaneous TID WC  . metFORMIN  500 mg Oral BID WC  . rifampin  600 mg Oral Daily  . senna  1 tablet Oral BID  . sodium chloride flush  3 mL Intravenous Q12H    SUBJECTIVE:  Afebrile overnight with no acute events. Feeling better today. No new concerns/complaints.   Allergies  Allergen Reactions  . Lisinopril-Hydrochlorothiazide Other (See Comments)    Angioedema  - Tongue swelling   . Codeine Itching  . Oxycodone Itching  . Simvastatin Other (See Comments)    Muscle pain  . Tramadol Itching     Review of Systems: Review of Systems  Constitutional: Negative for chills, fever and weight loss.  Respiratory: Negative for cough, shortness of breath and wheezing.   Cardiovascular: Negative for chest pain and leg swelling.  Gastrointestinal: Negative for abdominal pain, constipation, diarrhea, nausea and vomiting.  Skin: Negative for rash.      OBJECTIVE: Vitals:   05/30/20 1612 05/31/20 0047 05/31/20 0300 05/31/20 0700  BP: (!) 147/63 (!) 129/57 (!) 147/69 (!) 135/53  Pulse: 78 84 81 83  Resp: '16 17 17 18  ' Temp: 97.7 F (36.5 C) 97.6 F (36.4 C) (!) 97.4 F (36.3 C) 98.2 F (36.8 C)  TempSrc: Oral Oral Oral Oral  SpO2: 100% 98% 99% 98%  Weight:      Height:       Body mass index is 28.31 kg/m.  Physical Exam Constitutional:      General: She is not in acute distress.    Appearance: She is well-developed.  Cardiovascular:     Rate and Rhythm: Normal rate and regular rhythm.     Heart sounds: Normal heart sounds.  Pulmonary:     Effort: Pulmonary effort is normal.     Breath sounds: Normal breath sounds.  Skin:    General: Skin is warm and dry.  Neurological:     Mental Status: She is alert and oriented to person, place, and  time.  Psychiatric:        Mood and Affect: Mood normal.     Lab Results Lab Results  Component Value Date   WBC 13.2 (H) 05/29/2020   HGB 9.6 (L) 05/29/2020   HCT 28.7 (L) 05/29/2020   MCV 81.3 05/29/2020   PLT 379 05/29/2020    Lab Results  Component Value Date   CREATININE 0.62 05/29/2020   BUN 5 (L) 05/29/2020   NA 132 (L) 05/29/2020   K 4.2 05/29/2020   CL 107 05/29/2020   CO2 19 (L) 05/29/2020    Lab Results  Component Value Date   ALT 18 08/04/2019   AST 17 08/04/2019   ALKPHOS 109 08/04/2019   BILITOT 0.6 08/04/2019     Microbiology: Recent Results (from the past 240 hour(s))  SARS CORONAVIRUS 2 (TAT 6-24 HRS) Nasopharyngeal Nasopharyngeal Swab     Status: None   Collection Time: 05/24/20  5:41 PM   Specimen: Nasopharyngeal Swab  Result Value Ref Range Status   SARS Coronavirus 2 NEGATIVE NEGATIVE Final    Comment: (NOTE) SARS-CoV-2 target nucleic acids are NOT DETECTED.  The SARS-CoV-2 RNA is generally detectable in upper and lower respiratory specimens during the acute phase of infection. Negative results do not preclude SARS-CoV-2 infection, do not rule out co-infections with other pathogens, and should not be used as the sole basis for treatment or other patient management decisions. Negative results must be combined with clinical observations, patient history, and epidemiological information. The expected result is Negative.  Fact Sheet for Patients: SugarRoll.be  Fact Sheet for Healthcare Providers: https://www.woods-mathews.com/  This test is not yet approved or cleared by the [place] FDA and  has been authorized for detection and/or diagnosis of SARS-CoV-2 by FDA under an Emergency Use Authorization (EUA). This EUA will remain  in effect (meaning this test can be used) for the duration of the COVID-19 declaration under Se ction 564(b)(1) of the Act, 21 U.S.C. section 360bbb-3(b)(1), unless the  authorization is terminated or revoked sooner.  Performed at [facility], [place] [address] [place], [place] [address]   Surgical PCR screen     Status: Abnormal   Collection Time: 05/24/20 11:08 PM   Specimen: Nasal Mucosa; Nasal Swab  Result Value Ref Range Status   MRSA, PCR POSITIVE (A) NEGATIVE Final    Comment: RESULT CALLED TO, READ BACK BY AND VERIFIED WITH: R SEWELL RN 05/25/20 2778 JDW    Staphylococcus aureus POSITIVE (A) NEGATIVE Final    Comment: (NOTE) The Xpert SA Assay (FDA approved for NASAL specimens in patients 6 years of age and  older), is one component of a comprehensive surveillance program. It is not intended to diagnose infection nor to guide or monitor treatment. Performed at [facility], [place] [address], [place], [place] [address]   Aerobic/Anaerobic Culture w Gram Stain (surgical/deep wound)     Status: None   Collection Time: 05/25/20  5:05 PM   Specimen: Wound  Result Value Ref Range Status   Specimen Description WOUND  Final   Special Requests LUMBAR INFECTION NO 1  Final   Gram Stain   Final    RARE WBC PRESENT, PREDOMINANTLY PMN FEW GRAM POSITIVE COCCI    Culture   Final    ABUNDANT METHICILLIN RESISTANT STAPHYLOCOCCUS AUREUS NO ANAEROBES ISOLATED Performed at [facility], [place] [address], [place], [place] [address]    Report Status 05/30/2020 FINAL  Final   Organism ID, Bacteria METHICILLIN RESISTANT STAPHYLOCOCCUS AUREUS  Final      Susceptibility   Methicillin resistant staphylococcus aureus - MIC*    CIPROFLOXACIN >=8 RESISTANT Resistant     ERYTHROMYCIN >=8 RESISTANT Resistant     GENTAMICIN <=0.5 SENSITIVE Sensitive     OXACILLIN >=4 RESISTANT Resistant     TETRACYCLINE >=16 RESISTANT Resistant     VANCOMYCIN 1 SENSITIVE Sensitive     TRIMETH/SULFA <=10 SENSITIVE Sensitive     CLINDAMYCIN >=8 RESISTANT Resistant     RIFAMPIN <=0.5 SENSITIVE Sensitive     Inducible Clindamycin NEGATIVE Sensitive     * ABUNDANT  METHICILLIN RESISTANT STAPHYLOCOCCUS AUREUS  Aerobic/Anaerobic Culture w Gram Stain (surgical/deep wound)     Status: None   Collection Time: 05/25/20  5:15 PM   Specimen: Wound  Result Value Ref Range Status   Specimen Description WOUND  Final   Special Requests LUMBAR INFECTION NO 2  Final   Gram Stain   Final    RARE WBC PRESENT, PREDOMINANTLY PMN RARE GRAM POSITIVE COCCI    Culture   Final    MODERATE STAPHYLOCOCCUS AUREUS SUSCEPTIBILITIES PERFORMED ON PREVIOUS CULTURE WITHIN THE LAST 5 DAYS. NO ANAEROBES ISOLATED Performed at [facility], [place] [address] [place], [place] [address]    Report Status 05/30/2020 FINAL  Final  Culture, blood (single)     Status: None   Collection Time: 05/26/20  8:07 AM   Specimen: BLOOD  Result Value Ref Range Status   Specimen Description BLOOD RIGHT ANTECUBITAL  Final   Special Requests   Final    BOTTLES DRAWN AEROBIC AND ANAEROBIC Blood Culture adequate volume   Culture   Final    NO GROWTH 5 DAYS Performed at [facility], [place] [address] [place], [place] [address]    Report Status 05/31/2020 FINAL  Final  Culture, blood (single)     Status: None   Collection Time: 05/26/20  9:11 AM   Specimen: BLOOD RIGHT FOREARM  Result Value Ref Range Status   Specimen Description BLOOD RIGHT FOREARM  Final   Special Requests   Final    BOTTLES DRAWN AEROBIC AND ANAEROBIC Blood Culture adequate volume   Culture   Final    NO GROWTH 5 DAYS Performed at [facility], [place] [address] [place], [place] [address]    Report Status 05/31/2020 FINAL  Final     Terri Piedra, NP [place] for Infectious Disease [facility]  05/31/2020  10:08 AM

## 2020-05-31 NOTE — Progress Notes (Signed)
Subjective: Patient reports mild back pain but doing well  Objective: Vital signs in last 24 hours: Temp:  [97.4 F (36.3 C)-98.2 F (36.8 C)] 98.2 F (36.8 C) (04/05 0700) Pulse Rate:  [75-84] 83 (04/05 0700) Resp:  [16-18] 18 (04/05 0700) BP: (129-164)/(53-78) 135/53 (04/05 0700) SpO2:  [98 %-100 %] 98 % (04/05 0700)  Intake/Output from previous day: 04/04 0701 - 04/05 0700 In: 53 [I.V.:3; IV Piggyback:50] Out: 87 [Drains:87] Intake/Output this shift: No intake/output data recorded.  Neurologic: Grossly normal  Lab Results: Lab Results  Component Value Date   WBC 13.2 (H) 05/29/2020   HGB 9.6 (L) 05/29/2020   HCT 28.7 (L) 05/29/2020   MCV 81.3 05/29/2020   PLT 379 05/29/2020   Lab Results  Component Value Date   INR 1.0 04/21/2020   BMET Lab Results  Component Value Date   NA 132 (L) 05/29/2020   K 4.2 05/29/2020   CL 107 05/29/2020   CO2 19 (L) 05/29/2020   GLUCOSE 86 05/29/2020   BUN 5 (L) 05/29/2020   CREATININE 0.62 05/29/2020   CALCIUM 8.3 (L) 05/29/2020    Studies/Results: [place] EKG SITE RITE  Result Date: 05/30/2020 If Site Rite image not attached, placement could not be confirmed due to current cardiac rhythm.   Assessment/Plan: Postop day 7 lumbar wound I&D. Picc line was placed last night. We will work on getting home health set up. Once that is in place we will discharge her home.    LOS: 7 days    Tiana Loft [facility] 05/31/2020, 7:43 AM

## 2020-05-31 NOTE — Progress Notes (Signed)
Physical Therapy Treatment Patient Details Name: April Shaffer MRN: [id number] DOB: 1951/02/26 Today's Date: 05/31/2020    History of Present Illness Pt is a 70 yo female presenting for lumbar wound infection, s/p I&D lumbar wound 3/30. PMH  L2-5 PLIF 04/25/20, CAD, DM Type ll, HTN, sleep apnea, bilateral cataract sx, and R THA (2020).    PT Comments    Pt tolerates treatment well, ambulating for household distances without physical assistance. Pt does continue to demonstrate impaired implementation of back precautions when mobilizing in bed, and will benefit from continued education from PT on these precautions. PT continues to recommend no PT or DME needs at the time of discharge.   Follow Up Recommendations  No PT follow up;Supervision - Intermittent     Equipment Recommendations  None recommended by PT    Recommendations for Other Services       Precautions / Restrictions Precautions Precautions: Fall;Back Precaution Booklet Issued: No Precaution Comments: reviewed back precautions Other Brace: no brace needed Restrictions Weight Bearing Restrictions: No    Mobility  Bed Mobility Overal bed mobility: Needs Assistance Bed Mobility: Rolling;Sidelying to Sit;Sit to Supine Rolling: Supervision Sidelying to sit: Min guard   Sit to supine: Supervision   General bed mobility comments: use of railings, pt does not perform log roll or sit to sidelying when returning to bed    Transfers Overall transfer level: Needs assistance Equipment used: Rolling walker (2 wheeled) Transfers: Sit to/from Stand Sit to Stand: Supervision            Ambulation/Gait Ambulation/Gait assistance: Supervision Gait Distance (Feet): 180 Feet Assistive device: Rolling walker (2 wheeled) Gait Pattern/deviations: Step-through pattern Gait velocity: decr Gait velocity interpretation: <1.8 ft/sec, indicate of risk for recurrent falls General Gait Details: pt with slowed step-through gait,  no significant balance deviations noted   Stairs             Wheelchair Mobility    Modified Rankin (Stroke Patients Only)       Balance Overall balance assessment: Needs assistance Sitting-balance support: No upper extremity supported;Feet supported Sitting balance-Leahy Scale: Good     Standing balance support: Single extremity supported;Bilateral upper extremity supported Standing balance-Leahy Scale: Poor Standing balance comment: Use of RW for support.                            Cognition Arousal/Alertness: Awake/alert Behavior During Therapy: WFL for tasks assessed/performed Overall Cognitive Status: Impaired/Different from baseline Area of Impairment: Memory                     Memory: Decreased recall of precautions         General Comments: pt requires cues for implementation of precautions      Exercises      General Comments General comments (skin integrity, edema, etc.): VSS on RA, dressing intact      Pertinent Vitals/Pain Pain Assessment: Faces Faces Pain Scale: Hurts little more Pain Location: back and RLE Pain Descriptors / Indicators: Grimacing Pain Intervention(s): Monitored during session    Home Living                      Prior Function            PT Goals (current goals can now be found in the care plan section) Acute Rehab PT Goals Patient Stated Goal: less pain Progress towards PT goals: Progressing toward goals  Frequency    Min 5X/week      PT Plan Current plan remains appropriate    Co-evaluation              AM-PAC PT "6 Clicks" Mobility   Outcome Measure  Help needed turning from your back to your side while in a flat bed without using bedrails?: A Little Help needed moving from lying on your back to sitting on the side of a flat bed without using bedrails?: A Little Help needed moving to and from a bed to a chair (including a wheelchair)?: A Little Help needed  standing up from a chair using your arms (e.g., wheelchair or bedside chair)?: A Little Help needed to walk in hospital room?: A Little Help needed climbing 3-5 steps with a railing? : A Little 6 Click Score: 18    End of Session   Activity Tolerance: Patient tolerated treatment well Patient left: in bed;with call bell/phone within reach;with bed alarm set Nurse Communication: Mobility status PT Visit Diagnosis: Other abnormalities of gait and mobility (R26.89);Pain Pain - Right/Left: Right Pain - part of body: Hip     Time: 8916-9450 PT Time Calculation (min) (ACUTE ONLY): 12 min  Charges:  $Therapeutic Activity: 8-22 mins                     Arlyss Gandy, PT, DPT Acute Rehabilitation Pager: [phone]    Arlyss Gandy 05/31/2020, 12:39 PM

## 2020-05-31 NOTE — Progress Notes (Signed)
PHARMACY CONSULT NOTE FOR:  OUTPATIENT  PARENTERAL ANTIBIOTIC THERAPY (OPAT)  Indication: Surgical wound infection / osteomyelitis  Regimen: Daptomycin 650mg  IV every 24 hours End date: 07/06/2020  IV antibiotic discharge orders are pended. To discharging provider:  please sign these orders via discharge navigator,  Select New Orders & click on the button choice - Manage This Unsigned Work.     Thank you for allowing pharmacy to be a part of this patient's care.  09/05/2020, PharmD PGY1 Acute Care Pharmacy Resident

## 2020-06-01 LAB — COMPREHENSIVE METABOLIC PANEL
ALT: 14 U/L (ref 0–44)
AST: 25 U/L (ref 15–41)
Albumin: 1.8 g/dL — ABNORMAL LOW (ref 3.5–5.0)
Alkaline Phosphatase: 116 U/L (ref 38–126)
Anion gap: 7 (ref 5–15)
BUN: 5 mg/dL — ABNORMAL LOW (ref 8–23)
CO2: 18 mmol/L — ABNORMAL LOW (ref 22–32)
Calcium: 8.4 mg/dL — ABNORMAL LOW (ref 8.9–10.3)
Chloride: 109 mmol/L (ref 98–111)
Creatinine, Ser: 0.65 mg/dL (ref 0.44–1.00)
GFR, Estimated: >60 mL/min (ref >60)
Glucose, Bld: 97 mg/dL (ref 70–99)
Potassium: 3.7 mmol/L (ref 3.5–5.1)
Sodium: 134 mmol/L — ABNORMAL LOW (ref 135–145)
Total Bilirubin: 0.8 mg/dL (ref 0.3–1.2)
Total Protein: 7.2 g/dL (ref 6.5–8.1)

## 2020-06-01 MED ORDER — AMLODIPINE BESYLATE 5 MG PO TABS: 5.0000 mg | ORAL_TABLET | Freq: Two times a day (BID) | ORAL | 1 refills | Status: DC

## 2020-06-01 MED ORDER — AMLODIPINE BESYLATE 5 MG PO TABS: 5.0000 mg | ORAL_TABLET | Freq: Two times a day (BID) | ORAL | Status: DC

## 2020-06-01 MED ORDER — RIFAMPIN 300 MG PO CAPS: 600.0000 mg | ORAL_CAPSULE | Freq: Every day | ORAL | 0 refills | Status: DC

## 2020-06-01 MED ORDER — OXYCODONE-ACETAMINOPHEN 10-325 MG PO TABS: 1.0000 | ORAL_TABLET | Freq: Four times a day (QID) | ORAL | 0 refills | Status: DC | PRN

## 2020-06-01 NOTE — Progress Notes (Signed)
We came by to see her today and she has copious amounts of serosanguineous looking drainage from the wound.  It does not look like spinal fluid and is not overly purulent.  Her back soreness and the intermittent aching in the lateral thighs bilaterally is unchanged.  She looks nontoxic.  She has good strength.  She mobilizes in the bed easily.  We milked the wound and drained out what we could and then we placed staples to help stop the drainage.  Her drain was removed yesterday.  It did not have a lot of output before we removed it.  Hopefully this will stop the drainage and she will still be able to go home later today.  She is on daptomycin and rifampin per infectious disease.  She is eager for discharge and I certainly understand that.  We will check on her again this afternoon to make sure wound continues to stay dry.

## 2020-06-01 NOTE — Discharge Summary (Signed)
Physician Discharge Summary  Patient ID: April Shaffer MRN: [id number] DOB/AGE: [id number] 68 y.o.  Admit date: 05/24/2020 Discharge date: 06/01/2020  Admission Diagnoses: lumbar wound infection   Discharge Diagnoses: same   Discharged Condition: good  Hospital Course: The patient was admitted on 05/24/2020 and taken to the operating room where the patient underwent lumbar wound I&D. The patient tolerated the procedure well and was taken to the recovery room and then to the floor in stable condition. The hospital course was routine. There were no complications. The wound remained clean dry and intact. Pt had appropriate back soreness. No complaints of leg pain or new N/T/W. The patient remained afebrile with stable vital signs, and tolerated a regular diet. The patient continued to increase activities, and pain was well controlled with oral pain medications.   Consults: ID  Significant Diagnostic Studies:  Results for orders placed or performed during the hospital encounter of 05/24/20  SARS CORONAVIRUS 2 (TAT 6-24 HRS) Nasopharyngeal Nasopharyngeal Swab   Specimen: Nasopharyngeal Swab  Result Value Ref Range   SARS Coronavirus 2 NEGATIVE NEGATIVE  Surgical PCR screen   Specimen: Nasal Mucosa; Nasal Swab  Result Value Ref Range   MRSA, PCR POSITIVE (A) NEGATIVE   Staphylococcus aureus POSITIVE (A) NEGATIVE  Aerobic/Anaerobic Culture w Gram Stain (surgical/deep wound)   Specimen: Wound  Result Value Ref Range   Specimen Description WOUND    Special Requests LUMBAR INFECTION NO 1    Gram Stain      RARE WBC PRESENT, PREDOMINANTLY PMN FEW GRAM POSITIVE COCCI    Culture      ABUNDANT METHICILLIN RESISTANT STAPHYLOCOCCUS AUREUS NO ANAEROBES ISOLATED Performed at [facility], [place] [address], [place], [place] [address]    Report Status 05/30/2020 FINAL    Organism ID, Bacteria METHICILLIN RESISTANT STAPHYLOCOCCUS AUREUS       Susceptibility   Methicillin resistant  staphylococcus aureus - MIC*    CIPROFLOXACIN >=8 RESISTANT Resistant     ERYTHROMYCIN >=8 RESISTANT Resistant     GENTAMICIN <=0.5 SENSITIVE Sensitive     OXACILLIN >=4 RESISTANT Resistant     TETRACYCLINE >=16 RESISTANT Resistant     VANCOMYCIN 1 SENSITIVE Sensitive     TRIMETH/SULFA <=10 SENSITIVE Sensitive     CLINDAMYCIN >=8 RESISTANT Resistant     RIFAMPIN <=0.5 SENSITIVE Sensitive     Inducible Clindamycin NEGATIVE Sensitive     * ABUNDANT METHICILLIN RESISTANT STAPHYLOCOCCUS AUREUS  Aerobic/Anaerobic Culture w Gram Stain (surgical/deep wound)   Specimen: Wound  Result Value Ref Range   Specimen Description WOUND    Special Requests LUMBAR INFECTION NO 2    Gram Stain      RARE WBC PRESENT, PREDOMINANTLY PMN RARE GRAM POSITIVE COCCI    Culture      MODERATE STAPHYLOCOCCUS AUREUS SUSCEPTIBILITIES PERFORMED ON PREVIOUS CULTURE WITHIN THE LAST 5 DAYS. NO ANAEROBES ISOLATED Performed at [facility], [place] [address] [place], [place] [address]    Report Status 05/30/2020 FINAL   Culture, blood (single)   Specimen: BLOOD  Result Value Ref Range   Specimen Description BLOOD RIGHT ANTECUBITAL    Special Requests      BOTTLES DRAWN AEROBIC AND ANAEROBIC Blood Culture adequate volume   Culture      NO GROWTH 5 DAYS Performed at [facility], [place] [address], [place], [place] [address]    Report Status 05/31/2020 FINAL   Culture, blood (single)   Specimen: BLOOD RIGHT FOREARM  Result Value Ref  Range   Specimen Description BLOOD RIGHT FOREARM    Special Requests      BOTTLES DRAWN AEROBIC AND ANAEROBIC Blood Culture adequate volume   Culture      NO GROWTH 5 DAYS Performed at [facility], [address], [place], [place] [address]    Report Status 05/31/2020 FINAL   Basic metabolic panel  Result Value Ref Range   Sodium 130 (L) 135 - 145 mmol/L   Potassium 3.6 3.5 - 5.1 mmol/L   Chloride 99 98 - 111 mmol/L   CO2 22 22 - 32 mmol/L   Glucose, Bld 160  (H) 70 - 99 mg/dL   BUN 12 8 - 23 mg/dL   Creatinine, Ser 1.03 (H) 0.44 - 1.00 mg/dL   Calcium 9.0 8.9 - 10.3 mg/dL   GFR, Estimated 60 (L) >60 mL/min   Anion gap 9 5 - 15  CBC with Differential/Platelet  Result Value Ref Range   WBC 13.1 (H) 4.0 - 10.5 K/uL   RBC 3.08 (L) 3.87 - 5.11 MIL/uL   Hemoglobin 8.1 (L) 12.0 - 15.0 g/dL   HCT 26.0 (L) 36.0 - 46.0 %   MCV 84.4 80.0 - 100.0 fL   MCH 26.3 26.0 - 34.0 pg   MCHC 31.2 30.0 - 36.0 g/dL   RDW 16.0 (H) 11.5 - 15.5 %   Platelets 272 150 - 400 K/uL   nRBC 0.0 0.0 - 0.2 %   Neutrophils Relative % 78 %   Neutro Abs 10.3 (H) 1.7 - 7.7 K/uL   Lymphocytes Relative 9 %   Lymphs Abs 1.2 0.7 - 4.0 K/uL   Monocytes Relative 10 %   Monocytes Absolute 1.3 (H) 0.1 - 1.0 K/uL   Eosinophils Relative 2 %   Eosinophils Absolute 0.2 0.0 - 0.5 K/uL   Basophils Relative 0 %   Basophils Absolute 0.0 0.0 - 0.1 K/uL   Immature Granulocytes 1 %   Abs Immature Granulocytes 0.11 (H) 0.00 - 0.07 K/uL  Sedimentation rate  Result Value Ref Range   Sed Rate 95 (H) 0 - 22 mm/hr  C-reactive protein  Result Value Ref Range   CRP 42.5 (H) <1.0 mg/dL  Hemoglobin A1c  Result Value Ref Range   Hgb A1c MFr Bld 6.4 (H) 4.8 - 5.6 %   Mean Plasma Glucose 136.98 mg/dL  Glucose, capillary  Result Value Ref Range   Glucose-Capillary 149 (H) 70 - 99 mg/dL  Glucose, capillary  Result Value Ref Range   Glucose-Capillary 139 (H) 70 - 99 mg/dL  Glucose, capillary  Result Value Ref Range   Glucose-Capillary 126 (H) 70 - 99 mg/dL  Glucose, capillary  Result Value Ref Range   Glucose-Capillary 125 (H) 70 - 99 mg/dL  Glucose, capillary  Result Value Ref Range   Glucose-Capillary 108 (H) 70 - 99 mg/dL  Glucose, capillary  Result Value Ref Range   Glucose-Capillary 115 (H) 70 - 99 mg/dL  Glucose, capillary  Result Value Ref Range   Glucose-Capillary 135 (H) 70 - 99 mg/dL  Glucose, capillary  Result Value Ref Range   Glucose-Capillary 97 70 - 99 mg/dL   Glucose, capillary  Result Value Ref Range   Glucose-Capillary 95 70 - 99 mg/dL  Glucose, capillary  Result Value Ref Range   Glucose-Capillary 156 (H) 70 - 99 mg/dL  Basic metabolic panel  Result Value Ref Range   Sodium 135 135 - 145 mmol/L   Potassium 3.2 (L) 3.5 - 5.1 mmol/L  Chloride 110 98 - 111 mmol/L   CO2 18 (L) 22 - 32 mmol/L   Glucose, Bld 92 70 - 99 mg/dL   BUN 7 (L) 8 - 23 mg/dL   Creatinine, Ser 0.78 0.44 - 1.00 mg/dL   Calcium 8.1 (L) 8.9 - 10.3 mg/dL   GFR, Estimated >60 >60 mL/min   Anion gap 7 5 - 15  CBC with Differential/Platelet  Result Value Ref Range   WBC 12.5 (H) 4.0 - 10.5 K/uL   RBC 2.38 (L) 3.87 - 5.11 MIL/uL   Hemoglobin 6.4 (LL) 12.0 - 15.0 g/dL   HCT 19.6 (L) 36.0 - 46.0 %   MCV 82.4 80.0 - 100.0 fL   MCH 26.9 26.0 - 34.0 pg   MCHC 32.7 30.0 - 36.0 g/dL   RDW 17.5 (H) 11.5 - 15.5 %   Platelets 260 150 - 400 K/uL   nRBC 0.0 0.0 - 0.2 %   Neutrophils Relative % 72 %   Neutro Abs 9.0 (H) 1.7 - 7.7 K/uL   Lymphocytes Relative 14 %   Lymphs Abs 1.7 0.7 - 4.0 K/uL   Monocytes Relative 10 %   Monocytes Absolute 1.2 (H) 0.1 - 1.0 K/uL   Eosinophils Relative 2 %   Eosinophils Absolute 0.3 0.0 - 0.5 K/uL   Basophils Relative 0 %   Basophils Absolute 0.0 0.0 - 0.1 K/uL   Immature Granulocytes 2 %   Abs Immature Granulocytes 0.30 (H) 0.00 - 0.07 K/uL  Glucose, capillary  Result Value Ref Range   Glucose-Capillary 114 (H) 70 - 99 mg/dL  Glucose, capillary  Result Value Ref Range   Glucose-Capillary 106 (H) 70 - 99 mg/dL  Glucose, capillary  Result Value Ref Range   Glucose-Capillary 101 (H) 70 - 99 mg/dL  Glucose, capillary  Result Value Ref Range   Glucose-Capillary 153 (H) 70 - 99 mg/dL  Glucose, capillary  Result Value Ref Range   Glucose-Capillary 145 (H) 70 - 99 mg/dL  Glucose, capillary  Result Value Ref Range   Glucose-Capillary 107 (H) 70 - 99 mg/dL  Basic metabolic panel  Result Value Ref Range   Sodium 131 (L) 135 - 145  mmol/L   Potassium 3.3 (L) 3.5 - 5.1 mmol/L   Chloride 106 98 - 111 mmol/L   CO2 18 (L) 22 - 32 mmol/L   Glucose, Bld 91 70 - 99 mg/dL   BUN <5 (L) 8 - 23 mg/dL   Creatinine, Ser 0.64 0.44 - 1.00 mg/dL   Calcium 8.1 (L) 8.9 - 10.3 mg/dL   GFR, Estimated >60 >60 mL/min   Anion gap 7 5 - 15  CBC with Differential/Platelet  Result Value Ref Range   WBC 14.8 (H) 4.0 - 10.5 K/uL   RBC 3.32 (L) 3.87 - 5.11 MIL/uL   Hemoglobin 9.2 (L) 12.0 - 15.0 g/dL   HCT 27.1 (L) 36.0 - 46.0 %   MCV 81.6 80.0 - 100.0 fL   MCH 27.7 26.0 - 34.0 pg   MCHC 33.9 30.0 - 36.0 g/dL   RDW 16.4 (H) 11.5 - 15.5 %   Platelets 311 150 - 400 K/uL   nRBC 0.0 0.0 - 0.2 %   Neutrophils Relative % 72 %   Neutro Abs 10.9 (H) 1.7 - 7.7 K/uL   Lymphocytes Relative 13 %   Lymphs Abs 2.0 0.7 - 4.0 K/uL   Monocytes Relative 8 %   Monocytes Absolute 1.1 (H) 0.1 - 1.0 K/uL   Eosinophils Relative 2 %  Eosinophils Absolute 0.3 0.0 - 0.5 K/uL   Basophils Relative 0 %   Basophils Absolute 0.0 0.0 - 0.1 K/uL   Immature Granulocytes 5 %   Abs Immature Granulocytes 0.68 (H) 0.00 - 0.07 K/uL  Glucose, capillary  Result Value Ref Range   Glucose-Capillary 90 70 - 99 mg/dL  Glucose, capillary  Result Value Ref Range   Glucose-Capillary 107 (H) 70 - 99 mg/dL  Glucose, capillary  Result Value Ref Range   Glucose-Capillary 87 70 - 99 mg/dL  Glucose, capillary  Result Value Ref Range   Glucose-Capillary 102 (H) 70 - 99 mg/dL  Vancomycin, peak  Result Value Ref Range   Vancomycin Pk 20 (L) 30 - 40 ug/mL  Glucose, capillary  Result Value Ref Range   Glucose-Capillary 104 (H) 70 - 99 mg/dL  Basic metabolic panel  Result Value Ref Range   Sodium 132 (L) 135 - 145 mmol/L   Potassium 4.2 3.5 - 5.1 mmol/L   Chloride 107 98 - 111 mmol/L   CO2 19 (L) 22 - 32 mmol/L   Glucose, Bld 86 70 - 99 mg/dL   BUN 5 (L) 8 - 23 mg/dL   Creatinine, Ser 0.62 0.44 - 1.00 mg/dL   Calcium 8.3 (L) 8.9 - 10.3 mg/dL   GFR, Estimated >60 >60  mL/min   Anion gap 6 5 - 15  CBC with Differential/Platelet  Result Value Ref Range   WBC 13.2 (H) 4.0 - 10.5 K/uL   RBC 3.53 (L) 3.87 - 5.11 MIL/uL   Hemoglobin 9.6 (L) 12.0 - 15.0 g/dL   HCT 28.7 (L) 36.0 - 46.0 %   MCV 81.3 80.0 - 100.0 fL   MCH 27.2 26.0 - 34.0 pg   MCHC 33.4 30.0 - 36.0 g/dL   RDW 16.9 (H) 11.5 - 15.5 %   Platelets 379 150 - 400 K/uL   nRBC 0.0 0.0 - 0.2 %   Neutrophils Relative % 85 %   Neutro Abs 11.2 (H) 1.7 - 7.7 K/uL   Lymphocytes Relative 10 %   Lymphs Abs 1.3 0.7 - 4.0 K/uL   Monocytes Relative 4 %   Monocytes Absolute 0.5 0.1 - 1.0 K/uL   Eosinophils Relative 1 %   Eosinophils Absolute 0.1 0.0 - 0.5 K/uL   Basophils Relative 0 %   Basophils Absolute 0.0 0.0 - 0.1 K/uL   nRBC 0 0 /100 WBC   Abs Immature Granulocytes 0.00 0.00 - 0.07 K/uL   Polychromasia PRESENT   Glucose, capillary  Result Value Ref Range   Glucose-Capillary 101 (H) 70 - 99 mg/dL  Vancomycin, trough  Result Value Ref Range   Vancomycin Tr 15 15 - 20 ug/mL  Glucose, capillary  Result Value Ref Range   Glucose-Capillary 84 70 - 99 mg/dL  Glucose, capillary  Result Value Ref Range   Glucose-Capillary 88 70 - 99 mg/dL  Glucose, capillary  Result Value Ref Range   Glucose-Capillary 163 (H) 70 - 99 mg/dL  Glucose, capillary  Result Value Ref Range   Glucose-Capillary 91 70 - 99 mg/dL  Glucose, capillary  Result Value Ref Range   Glucose-Capillary 85 70 - 99 mg/dL  Glucose, capillary  Result Value Ref Range   Glucose-Capillary 104 (H) 70 - 99 mg/dL  CK  Result Value Ref Range   Total CK 21 (L) 38 - 234 U/L  Glucose, capillary  Result Value Ref Range   Glucose-Capillary 95 70 - 99 mg/dL  Glucose, capillary  Result Value Ref  Range   Glucose-Capillary 97 70 - 99 mg/dL  Glucose, capillary  Result Value Ref Range   Glucose-Capillary 89 70 - 99 mg/dL  Glucose, capillary  Result Value Ref Range   Glucose-Capillary 98 70 - 99 mg/dL  Glucose, capillary  Result Value Ref  Range   Glucose-Capillary 91 70 - 99 mg/dL  Comprehensive metabolic panel  Result Value Ref Range   Sodium 134 (L) 135 - 145 mmol/L   Potassium 3.7 3.5 - 5.1 mmol/L   Chloride 109 98 - 111 mmol/L   CO2 18 (L) 22 - 32 mmol/L   Glucose, Bld 97 70 - 99 mg/dL   BUN 5 (L) 8 - 23 mg/dL   Creatinine, Ser 0.65 0.44 - 1.00 mg/dL   Calcium 8.4 (L) 8.9 - 10.3 mg/dL   Total Protein 7.2 6.5 - 8.1 g/dL   Albumin 1.8 (L) 3.5 - 5.0 g/dL   AST 25 15 - 41 U/L   ALT 14 0 - 44 U/L   Alkaline Phosphatase 116 38 - 126 U/L   Total Bilirubin 0.8 0.3 - 1.2 mg/dL   GFR, Estimated >60 >60 mL/min   Anion gap 7 5 - 15  Type and screen [place]  Result Value Ref Range   ABO/RH(D) O POS    Antibody Screen NEG    Sample Expiration 05/28/2020,2359    Unit Number [id number]    Blood Component Type RED CELLS,LR    Unit division 00    Status of Unit ISSUED,FINAL    Transfusion Status OK TO TRANSFUSE    Crossmatch Result      Compatible Performed at [facility], [address] [place], [place] [address]    Unit Number [id number]    Blood Component Type RED CELLS,LR    Unit division 00    Status of Unit ISSUED,FINAL    Transfusion Status OK TO TRANSFUSE    Crossmatch Result Compatible   Prepare RBC (crossmatch)  Result Value Ref Range   Order Confirmation      ORDER PROCESSED BY BLOOD BANK Performed at [facility], [place] [address] [place],  [address]   BPAM [facility]  Result Value Ref Range   ISSUE DATE / TIME [id number]    Blood Product Unit Number [id number]    PRODUCT CODE [id number]    Unit Type and Rh 5100    Blood Product Expiration Date 759163846659    ISSUE DATE / TIME 935701779390    Blood Product Unit Number [id number]    PRODUCT CODE [id number]    Unit Type and Rh 5100    Blood Product Expiration Date [id number]     MR ABDOMEN W WO CONTRAST  Result Date: 05/29/2020 CLINICAL DATA:  Indeterminate renal masses on recent CT performed for  abdominal pain. EXAM: MRI ABDOMEN WITHOUT AND WITH CONTRAST TECHNIQUE: Multiplanar multisequence MR imaging of the abdomen was performed both before and after the administration of intravenous contrast. CONTRAST:  7m GADAVIST GADOBUTROL 1 MMOL/ML IV SOLN COMPARISON:  05/27/2020 CT abdomen/pelvis. FINDINGS: Lower chest: No acute abnormality at the lung bases. Hepatobiliary: Normal liver size and configuration. Mild diffuse hepatic steatosis. No liver mass. Normal gallbladder with no cholelithiasis. No biliary ductal dilatation. Common bile duct diameter 4 mm. No choledocholithiasis. No biliary masses, strictures or beading. Pancreas: No pancreatic mass or duct dilation.  No pancreas divisum. Spleen: Normal size. No mass. Adrenals/Urinary Tract: Normal adrenals. No hydronephrosis. Minimally T1 hyperintense and T2 hyperintense 2.0 x 2.0 cm  renal cortical lesion in the lateral lower left kidney (series 22/image 69) demonstrates no enhancement, compatible with a benign Bosniak category 2 hemorrhagic/proteinaceous renal cyst. Similar T1 isointense and T2 hyperintense 2.0 x 1.4 cm renal cortical lesion in the medial lower right kidney (series 22/image 77) demonstrates no enhancement, compatible with a benign Bosniak category 2 hemorrhagic/proteinaceous renal cyst. No suspicious renal masses. Multiple simple renal cortical cysts in both kidneys, largest 4.1 x 3.1 cm in the medial upper left kidney. Stomach/Bowel: Small hiatal hernia. Otherwise normal nondistended stomach. Visualized small and large bowel is normal caliber, with no bowel wall thickening. Vascular/Lymphatic: Normal caliber abdominal aorta. Patent portal, splenic, hepatic and renal veins. No pathologically enlarged lymph nodes in the abdomen. Other: No abdominal ascites or focal fluid collection. Musculoskeletal: No aggressive appearing focal osseous lesions. Bilateral posterior spinal fusion hardware throughout the lumbar spine. IMPRESSION: 1. No acute  abnormality. Benign Bosniak category 1 and category 2 renal cysts. No suspicious renal masses. 2. Mild diffuse hepatic steatosis. 3. Small hiatal hernia. Electronically Signed   By: Ilona Sorrel M.D.   On: 05/29/2020 05:54   CT ABDOMEN PELVIS W CONTRAST  Result Date: 05/27/2020 CLINICAL DATA:  Nonlocalized acute abdominal pain. Mostly on left. History of hysterectomy. EXAM: CT ABDOMEN AND PELVIS WITH CONTRAST TECHNIQUE: Multidetector CT imaging of the abdomen and pelvis was performed using the standard protocol following bolus administration of intravenous contrast. CONTRAST:  130m OMNIPAQUE IOHEXOL 300 MG/ML  SOLN COMPARISON:  X-ray lumbar spine 04/25/2020 FINDINGS: Lower chest: No acute abnormality. Hepatobiliary: The hepatic parenchyma is diffusely hypodense compared to the splenic parenchyma consistent with fatty infiltration. No focal liver abnormality. No gallstones, gallbladder wall thickening, or pericholecystic fluid. No biliary dilatation. Pancreas: No focal lesion. Normal pancreatic contour. No surrounding inflammatory changes. No main pancreatic ductal dilatation. Spleen: Normal in size without focal abnormality. Adrenals/Urinary Tract: No adrenal nodule bilaterally. Bilateral kidneys enhance symmetrically. There is a 3.8 cm fluid density lesion within left kidney likely represents a simple renal cyst. Several pericentimeter hypodensities within the kidneys appears slightly higher in density than simple free fluid. No hydronephrosis. No hydroureter. Limited evaluation of the urinary bladder due to streak artifact originating from the right femoral surgical hardware. Stomach/Bowel: PO contrast reaches the rectum. Stomach is within normal limits. No evidence of bowel wall thickening or dilatation. The majority of the large bowel is decompressed. Appendix not definitely identified. Vascular/Lymphatic: No abdominal aorta or iliac aneurysm. Moderate to severe calcified and noncalcified atherosclerotic  plaque of the aorta and its branches. No abdominal, pelvic, or inguinal lymphadenopathy. Reproductive: Status post hysterectomy. No adnexal masses. Other: No intraperitoneal free fluid. No intraperitoneal free gas. No organized fluid collection. Musculoskeletal: Mild subcutaneus soft tissue edema. Total right hip arthroplasty is partially visualized. L1 through L5 posterolateral fusion. Grade 1 anterolisthesis of L4 on L5. No acute displaced fracture. Multilevel degenerative changes of the spine. No suspicious lytic or blastic osseous lesions. IMPRESSION: 1. No acute intra-abdominal or intrapelvic abnormality. 2. Several pericentimeter hypodensities within the kidneys appears slightly higher in density than simple free fluid. These could represent complex cyst versus solid masses. Recommend MRI renal protocol for further evaluation. 3. Grade 1 anterolisthesis of L4 on L5 in a patient with L1 through L5 posterolateral fusion. 4.  Aortic Atherosclerosis (ICD10-I70.0). Electronically Signed   By: MIven FinnM.D.   On: 05/27/2020 23:00   [place]KG SITE RITE  Result Date: 05/30/2020 If Site Rite image not attached, placement could not be confirmed due to current  cardiac rhythm.   Antibiotics:  Anti-infectives (From admission, onward)   Start     Dose/Rate Route Frequency Ordered Stop   06/02/20 0000  rifampin (RIFADIN) 300 MG capsule        600 mg Oral Daily 06/01/20 1306     05/31/20 0000  daptomycin (CUBICIN) IVPB        650 mg Intravenous Every 24 hours 05/31/20 1602 07/06/20 2359   05/31/20 0000  rifampin (RIFADIN) 300 MG capsule        600 mg Oral Daily 05/31/20 1602     05/30/20 1415  DAPTOmycin (CUBICIN) 650 mg in sodium chloride 0.9 % IVPB        9 mg/kg  72.5 kg 126 mL/hr over 30 Minutes Intravenous Daily 05/30/20 1326     05/29/20 2200  vancomycin (VANCOCIN) 1,750 mg in sodium chloride 0.9 % 500 mL IVPB  Status:  Discontinued        1,750 mg 250 mL/hr over 120 Minutes Intravenous Every 24  hours 05/29/20 1213 05/29/20 1217   05/29/20 2200  vancomycin (VANCOREADY) IVPB 750 mg/150 mL  Status:  Discontinued        750 mg 150 mL/hr over 60 Minutes Intravenous Every 12 hours 05/29/20 1217 05/30/20 1316   05/25/20 2200  vancomycin (VANCOREADY) IVPB 750 mg/150 mL  Status:  Discontinued        750 mg 150 mL/hr over 60 Minutes Intravenous Every 12 hours 05/25/20 2054 05/29/20 1213   05/25/20 2100  cefTRIAXone (ROCEPHIN) 1 g in sodium chloride 0.9 % 100 mL IVPB  Status:  Discontinued        1 g 200 mL/hr over 30 Minutes Intravenous Every 24 hours 05/25/20 2012 05/26/20 0756   05/25/20 2100  rifampin (RIFADIN) capsule 600 mg        600 mg Oral Daily 05/25/20 2012     05/25/20 1730  vancomycin (VANCOREADY) IVPB 1000 mg/200 mL        1,000 mg 200 mL/hr over 60 Minutes Intravenous To Surgery 05/25/20 1726 05/25/20 1838   05/25/20 1729  vancomycin (VANCOCIN) powder  Status:  Discontinued          As needed 05/25/20 1730 05/25/20 1758   05/25/20 0600  ceFAZolin (ANCEF) IVPB 2g/100 mL premix        2 g 200 mL/hr over 30 Minutes Intravenous On call to O.R. 05/24/20 2018 05/25/20 2245      Discharge Exam: Blood pressure (!) 164/80, pulse 97, temperature 97.7 F (36.5 C), temperature source Oral, resp. rate 20, height '5\' 3"'  (1.6 m), weight 72.5 kg, SpO2 98 %. Neurologic: Grossly normal Ambulating and voiding well, incision cdi- was stapled today  Discharge Medications:   Allergies as of 06/01/2020      Reactions   Lisinopril-hydrochlorothiazide Other (See Comments)   Angioedema  - Tongue swelling    Codeine Itching   Oxycodone Itching   Simvastatin Other (See Comments)   Muscle pain   Tramadol Itching      Medication List    TAKE these medications   acetaminophen 650 MG CR tablet Commonly known as: TYLENOL Take 1,300 mg by mouth daily as needed for pain.   allopurinol 300 MG tablet Commonly known as: ZYLOPRIM Take 1 tablet (300 mg total) by mouth daily.   amLODipine 5 MG  tablet Commonly known as: NORVASC Take 1 tablet (5 mg total) by mouth 2 (two) times daily. What changed:   medication strength  how much to take  when to take this   aspirin EC 81 MG tablet Take 81 mg by mouth daily.   atorvastatin 80 MG tablet Commonly known as: LIPITOR TAKE 1 TABLET BY MOUTH ONCE DAILY WITH  EVENING  MEAL What changed:   how much to take  how to take this  when to take this  additional instructions   blood glucose meter kit and supplies Kit Check sugars twice daily   carvedilol 20 MG 24 hr capsule Commonly known as: COREG CR Take 1 capsule (20 mg total) by mouth daily.   celecoxib 100 MG capsule Commonly known as: CELEBREX Take 100 mg by mouth 2 (two) times daily as needed for mild pain.   clopidogrel 75 MG tablet Commonly known as: PLAVIX Take 75 mg by mouth daily.   daptomycin  IVPB Commonly known as: CUBICIN Inject 650 mg into the vein daily. Indication:  Surgical infection / osteomyelitis  First Dose: Yes Last Day of Therapy:  07/06/2020 Labs - Once weekly:  CBC/D, BMP, and CPK Labs - Every other week:  ESR and CRP Method of administration: IV Push Method of administration may be changed at the discretion of home infusion pharmacist based upon assessment of the patient and/or caregiver's ability to self-administer the medication ordered.   diphenhydrAMINE 25 MG tablet Commonly known as: BENADRYL Take 25 mg by mouth every 6 (six) hours as needed for itching or allergies.   ezetimibe 10 MG tablet Commonly known as: ZETIA TAKE 1 TABLET EVERY DAY (NEED MD APPOINTMENT) What changed: See the new instructions.   ferrous sulfate 325 (65 FE) MG tablet Take 325 mg by mouth daily with breakfast.   gabapentin 300 MG capsule Commonly known as: NEURONTIN 1 cap by mouth in morning, 1 cap by mouth midday and 2 caps by mouth before bedtime. What changed:   how much to take  how to take this  when to take this  additional instructions    glucose blood test strip Check sugars twice daily   metFORMIN 500 MG tablet Commonly known as: GLUCOPHAGE Take 500 mg by mouth 2 (two) times daily with a meal.   methocarbamol 500 MG tablet Commonly known as: Robaxin Take 1 tablet (500 mg total) by mouth 4 (four) times daily. What changed: when to take this   Mitigare 0.6 MG Caps Generic drug: Colchicine TAKE 1 CAPSULE TWICE DAILY AS NEEDED FOR GOUT FLARE AND HOLD ATORVASTATIN ON DAYS TAKING MITIGARE What changed: See the new instructions.   oxyCODONE-acetaminophen 10-325 MG tablet Commonly known as: PERCOCET Take 1 tablet by mouth every 6 (six) hours as needed for pain.   rifampin 300 MG capsule Commonly known as: RIFADIN Take 2 capsules (600 mg total) by mouth daily. Please provide sufficient quantity to treat through 07/06/20.   rifampin 300 MG capsule Commonly known as: RIFADIN Take 2 capsules (600 mg total) by mouth daily. Start taking on: June 02, 2020            Durable Medical Equipment  (From admission, onward)         Start     Ordered   05/25/20 2012  DME Walker rolling  Once       Question Answer Comment  Walker: With Rachel Wheels   Patient needs a walker to treat with the following condition Gait instability      05/25/20 2012   05/25/20 2012  DME 3 n 1  Once        05/25/20 2012  Discharge Care Instructions  (From admission, onward)         Start     Ordered   05/31/20 0000  Change dressing on IV access line weekly and PRN  (Home infusion instructions - [facility] )        05/31/20 1602          Disposition: home   Final Dx: lumbar wound I&D   Discharge Instructions    [facility] pharmacist to adjust dose for Vancomycin, Aminoglycosides and other anti-infective therapies as requested by physician.   Complete by: As directed    [facility] to provide Cath Flo 68m   Complete by: As directed    Administer for PICC line occlusion and as  ordered by physician for other access device issues.   Anaphylaxis Kit: Provided to treat any anaphylactic reaction to the medication being provided to the patient if First Dose or when requested by physician   Complete by: As directed    Epinephrine 141mml vial / amp: Administer 0.77m28m0.77ml74mubcutaneously once for moderate to severe anaphylaxis, nurse to call physician and pharmacy when reaction occurs and call 911 if needed for immediate care   Diphenhydramine 50mg34mIV vial: Administer 25-50mg 21mM PRN for first dose reaction, rash, itching, mild reaction, nurse to call physician and pharmacy when reaction occurs   Sodium Chloride 0.9% NS 500ml I42mdminister if needed for hypovolemic blood pressure drop or as ordered by physician after call to physician with anaphylactic reaction   Call MD for:  difficulty breathing, headache or visual disturbances   Complete by: As directed    Call MD for:  persistant nausea and vomiting   Complete by: As directed    Call MD for:  redness, tenderness, or signs of infection (pain, swelling, redness, odor or green/yellow discharge around incision site)   Complete by: As directed    Call MD for:  severe uncontrolled pain   Complete by: As directed    Call MD for:  temperature >100.4   Complete by: As directed    Change dressing on IV access line weekly and PRN   Complete by: As directed    Diet - low sodium heart healthy   Complete by: As directed    Flush IV access with Sodium Chloride 0.9% and Heparin 10 units/ml or 100 units/ml   Complete by: As directed    Home infusion instructions - [facility]   Complete by: As directed    Instructions: Flush IV access with Sodium Chloride 0.9% and Heparin 10units/ml or 100units/ml   Change dressing on IV access line: Weekly and PRN   Instructions Cath Flo 2mg: Ad49mister for PICC Line occlusion and as ordered by physician for other access device   [facility] pharmacist to adjust dose  for: Vancomycin, Aminoglycosides and other anti-infective therapies as requested by physician   Incentive spirometry RT   Complete by: As directed    Increase activity slowly   Complete by: As directed    Method of administration may be changed at the discretion of home infusion pharmacist based upon assessment of the patient and/or caregiver's ability to self-administer the medication ordered   Complete by: As directed    No wound care   Complete by: As directed        Follow-up Information    [facility], Robert [facility] up.   Specialty: Infectious Diseases Why: 4/27 @ 2.45 pm Contact information: [address] [place] [address] [phone]        [facility], [facility] Follow up.   Specialty: Home Health Services Contact information: [place] [address] [phone]        Eustace Moore, MD. Schedule an appointment as soon as possible for a visit in 2 week(s).   Specialty: Neurosurgery Contact information: [address] [place] [address] [place] [address] [phone]                Signed: Ocie Cornfield [facility] 06/01/2020, 1:06 PM

## 2020-06-01 NOTE — Progress Notes (Signed)
Occupational Therapy Treatment Patient Details Name: April Shaffer MRN: [id number] DOB: Jan 14, 1951 Today's Date: 06/01/2020    History of present illness Pt is a 70 yo female presenting for lumbar wound infection, s/p I&D lumbar wound 3/30. PMH  L2-5 PLIF 04/25/20, CAD, DM Type ll, HTN, sleep apnea, bilateral cataract sx, and R THA (2020).   OT comments  Patient progressing well towards OT goals, eager for dc home today.  Patient remains limited by pain but highly motivated.  She is completing transfers and in room mobility with supervision using RW, continues to require up to mod assist for LB ADLs but plans to use reacher/family assist at dc.  No further questions or concerns for self care/ADLs. Will follow acutely.    Follow Up Recommendations  No OT follow up;Supervision - Intermittent    Equipment Recommendations  None recommended by OT    Recommendations for Other Services      Precautions / Restrictions Precautions Precautions: Fall;Back Precaution Booklet Issued: Yes (comment) Precaution Comments: reviewed back precautions Other Brace: no brace needed Restrictions Weight Bearing Restrictions: No       Mobility Bed Mobility Overal bed mobility: Needs Assistance Bed Mobility: Rolling;Sidelying to Sit;Sit to Supine Rolling: Supervision Sidelying to sit: Supervision   Sit to supine: Supervision Sit to sidelying: Supervision General bed mobility comments: min cueing for log roll but no physical assist    Transfers Overall transfer level: Needs assistance Equipment used: Rolling walker (2 wheeled) Transfers: Sit to/from Stand Sit to Stand: Supervision         General transfer comment: for safety    Balance Overall balance assessment: Needs assistance Sitting-balance support: No upper extremity supported;Feet supported Sitting balance-Leahy Scale: Good     Standing balance support: Bilateral upper extremity supported;During functional activity Standing  balance-Leahy Scale: Poor Standing balance comment: relies on BUE support                           ADL either performed or assessed with clinical judgement   ADL Overall ADL's : Needs assistance/impaired                     Lower Body Dressing: Moderate assistance;Sit to/from stand Lower Body Dressing Details (indicate cue type and reason): able to adjust socks by crossing legs but would need assist to fully don, reports plan to use reacher for underwear/pants and have assist from family for socks as needed.  Sit to stand with supervision. Toilet Transfer: Supervision/safety;Ambulation;RW Toilet Transfer Details (indicate cue type and reason): simulated in room         Functional mobility during ADLs: Supervision/safety;Rolling walker General ADL Comments: progressing well     Vision   Vision Assessment?: No apparent visual deficits   Perception     Praxis      Cognition Arousal/Alertness: Awake/alert Behavior During Therapy: WFL for tasks assessed/performed Overall Cognitive Status: Within Functional Limits for tasks assessed Area of Impairment: Memory                     Memory: Decreased recall of precautions         General Comments: able to recall precautions and adhere functionally during ADLs        Exercises     Shoulder Instructions       General Comments No leakage from wound noted    Pertinent Vitals/ Pain       Pain Assessment: Faces  Faces Pain Scale: Hurts little more Pain Location: back and R leg Pain Descriptors / Indicators: Grimacing;Guarding Pain Intervention(s): Limited activity within patient's tolerance;Monitored during session;Repositioned  Home Living                                          Prior Functioning/Environment              Frequency  Min 2X/week        Progress Toward Goals  OT Goals(current goals can now be found in the care plan section)  Progress towards OT  goals: Progressing toward goals  Acute Rehab OT Goals Patient Stated Goal: to go home OT Goal Formulation: With patient  Plan Discharge plan remains appropriate;Frequency remains appropriate    Co-evaluation                 AM-PAC OT "6 Clicks" Daily Activity     Outcome Measure   Help from another person eating meals?: None Help from another person taking care of personal grooming?: None Help from another person toileting, which includes using toliet, bedpan, or urinal?: A Little Help from another person bathing (including washing, rinsing, drying)?: A Little Help from another person to put on and taking off regular upper body clothing?: A Little Help from another person to put on and taking off regular lower body clothing?: A Lot 6 Click Score: 19    End of Session Equipment Utilized During Treatment: Rolling walker  OT Visit Diagnosis: Other abnormalities of gait and mobility (R26.89);Pain Pain - part of body:  (back)   Activity Tolerance Patient tolerated treatment well   Patient Left in bed;with call bell/phone within reach;with bed alarm set   Nurse Communication Mobility status;Precautions        Time: 3710-6269 OT Time Calculation (min): 14 min  Charges: OT General Charges $OT Visit: 1 Visit OT Treatments $Self Care/Home Management : 8-22 mins  Barry Brunner, OT Acute Rehabilitation Services Pager [phone] Office [phone]    Chancy Milroy 06/01/2020, 3:55 PM

## 2020-06-01 NOTE — Progress Notes (Signed)
Patient agreed to stay in the hospital for the night and be discharge tomorrow. Informed Phyl. Per Dr. Jake Samples, no standing order for discharge tonight.

## 2020-06-01 NOTE — Progress Notes (Signed)
Physical Therapy Treatment Patient Details Name: April Shaffer MRN: [id number] DOB: 1950-05-05 Today's Date: 06/01/2020    History of Present Illness Pt is a 70 yo female presenting for lumbar wound infection, s/p I&D lumbar wound 3/30. PMH  L2-5 PLIF 04/25/20, CAD, DM Type ll, HTN, sleep apnea, bilateral cataract sx, and R THA (2020).    PT Comments    Pt with good verbal recall of spinal precautions, but poor compliance when performing bed mobility, needing cues to maintain. No LOB but extra time needed for all functional mobility using bed rails and a RW this date. Pt ambulating household distances safely with supervision while using the RW. Provided pt with spinal precautions handout. Will continue to follow acutely. Current recommendations remain appropriate.    Follow Up Recommendations  No PT follow up;Supervision - Intermittent     Equipment Recommendations  None recommended by PT    Recommendations for Other Services       Precautions / Restrictions Precautions Precautions: Fall;Back Precaution Booklet Issued: Yes (comment) Precaution Comments: reviewed back precautions Other Brace: no brace needed Restrictions Weight Bearing Restrictions: No    Mobility  Bed Mobility Overal bed mobility: Needs Assistance Bed Mobility: Rolling;Sidelying to Sit;Sit to Supine Rolling: Supervision Sidelying to sit: Min guard   Sit to supine: Supervision   General bed mobility comments: use of railings, pt does not perform log roll or sit to sidelying when returning to bed despite cues but rather pivots on buttocks and performs sit > supine.    Transfers Overall transfer level: Needs assistance Equipment used: Rolling walker (2 wheeled) Transfers: Sit to/from Stand Sit to Stand: Supervision         General transfer comment: for safety, increased time to rise.  Ambulation/Gait Ambulation/Gait assistance: Supervision Gait Distance (Feet): 200 Feet Assistive device: Rolling  walker (2 wheeled) Gait Pattern/deviations: Step-through pattern Gait velocity: decr Gait velocity interpretation: <1.8 ft/sec, indicate of risk for recurrent falls General Gait Details: pt with slowed step-through gait, no significant balance deviations noted   Stairs             Wheelchair Mobility    Modified Rankin (Stroke Patients Only)       Balance Overall balance assessment: Needs assistance Sitting-balance support: No upper extremity supported;Feet supported Sitting balance-Leahy Scale: Good     Standing balance support: Bilateral upper extremity supported Standing balance-Leahy Scale: Poor Standing balance comment: Use of RW for support.                            Cognition Arousal/Alertness: Awake/alert Behavior During Therapy: WFL for tasks assessed/performed Overall Cognitive Status: Impaired/Different from baseline Area of Impairment: Memory                     Memory: Decreased recall of precautions         General Comments: pt requires cues for implementation of precautions with bed mobility.      Exercises      General Comments General comments (skin integrity, edema, etc.): No leakage from wound noted      Pertinent Vitals/Pain Pain Assessment: Faces Faces Pain Scale: Hurts little more Pain Location: back and R leg Pain Descriptors / Indicators: Grimacing;Guarding Pain Intervention(s): Limited activity within patient's tolerance;Monitored during session;Repositioned    Home Living                      Prior Function  PT Goals (current goals can now be found in the care plan section) Acute Rehab PT Goals Patient Stated Goal: to go home PT Goal Formulation: With patient Time For Goal Achievement: 06/02/20 Potential to Achieve Goals: Good Progress towards PT goals: Progressing toward goals    Frequency    Min 5X/week      PT Plan Current plan remains appropriate     Co-evaluation              AM-PAC PT "6 Clicks" Mobility   Outcome Measure  Help needed turning from your back to your side while in a flat bed without using bedrails?: A Little Help needed moving from lying on your back to sitting on the side of a flat bed without using bedrails?: A Little Help needed moving to and from a bed to a chair (including a wheelchair)?: A Little Help needed standing up from a chair using your arms (e.g., wheelchair or bedside chair)?: A Little Help needed to walk in hospital room?: A Little Help needed climbing 3-5 steps with a railing? : A Little 6 Click Score: 18    End of Session Equipment Utilized During Treatment: Gait belt Activity Tolerance: Patient tolerated treatment well Patient left: in bed;with call bell/phone within reach;with bed alarm set   PT Visit Diagnosis: Other abnormalities of gait and mobility (R26.89);Pain Pain - Right/Left: Right Pain - part of body: Hip     Time: 1256-1310 PT Time Calculation (min) (ACUTE ONLY): 14 min  Charges:  $Therapeutic Activity: 8-22 mins                     [name], PT, DPT Acute Rehabilitation Services  Pager: [phone] Office: [phone]    [name] 06/01/2020, 1:20 PM

## 2020-06-01 NOTE — Care Management Important Message (Signed)
Important Message  Patient Details  Name: April Shaffer MRN: [id number] Date of Birth: 04-23-1950   Medicare Important Message Given:  Yes - Important Message mailed due to current National Emergency   Verbal consent obtained due to current National Emergency  Relationship to patient: Self Contact Name: Ermalinda Call Date: 06/01/20  Time: 1330 Phone: [phone] Outcome: No Answer/Busy Important Message mailed to: Patient address on file    Orson Aloe 06/01/2020, 1:30 PM

## 2020-06-01 NOTE — Progress Notes (Signed)
Antibiotics given as ordered, discharge instruction reviewed with patients husband and scripts given to husband. Educated husband on home cares information and instructed him to call once they are home to update them of discharge. Husband verbalized understanding.

## 2020-06-01 NOTE — Plan of Care (Signed)
  Problem: Education: Goal: Knowledge of General Education information will improve Description: Including pain rating scale, medication(s)/side effects and non-pharmacologic comfort measures Outcome: Adequate for Discharge   Problem: Health Behavior/Discharge Planning: Goal: Ability to manage health-related needs will improve Outcome: Adequate for Discharge   Problem: Clinical Measurements: Goal: Ability to maintain clinical measurements within normal limits will improve Outcome: Adequate for Discharge Goal: Will remain free from infection Outcome: Adequate for Discharge Goal: Diagnostic test results will improve Outcome: Adequate for Discharge Goal: Respiratory complications will improve Outcome: Adequate for Discharge Goal: Cardiovascular complication will be avoided Outcome: Adequate for Discharge   Problem: Activity: Goal: Risk for activity intolerance will decrease Outcome: Adequate for Discharge   Problem: Nutrition: Goal: Adequate nutrition will be maintained Outcome: Adequate for Discharge   Problem: Coping: Goal: Level of anxiety will decrease Outcome: Adequate for Discharge   Problem: Elimination: Goal: Will not experience complications related to bowel motility Outcome: Adequate for Discharge Goal: Will not experience complications related to urinary retention Outcome: Adequate for Discharge   Problem: Pain Managment: Goal: General experience of comfort will improve Outcome: Adequate for Discharge   Problem: Safety: Goal: Ability to remain free from injury will improve Outcome: Adequate for Discharge   Problem: Skin Integrity: Goal: Risk for impaired skin integrity will decrease Outcome: Adequate for Discharge   PICC line intact and locked. Educated husband on care and what to monitor for. Discharge paperwork reviewed with husband and script given to husband. Husband verbalized understanding and will call home health company once they are home.

## 2020-06-07 ENCOUNTER — Encounter: Payer: Self-pay | Admitting: Internal Medicine

## 2020-06-14 ENCOUNTER — Other Ambulatory Visit (HOSPITAL_COMMUNITY): Payer: Self-pay | Admitting: Student

## 2020-06-14 ENCOUNTER — Telehealth: Payer: Self-pay | Admitting: Family

## 2020-06-14 DIAGNOSIS — T148XXA Other injury of unspecified body region, initial encounter: Secondary | ICD-10-CM

## 2020-06-14 DIAGNOSIS — L089 Local infection of the skin and subcutaneous tissue, unspecified: Secondary | ICD-10-CM

## 2020-06-14 NOTE — Telephone Encounter (Signed)
[facility] at [facility] states that they will start the 1500mg  loading dose tomorrow (06/15/20) and begin the 1g q.8 hour dosing on 06/16/20.   06/18/20, RN

## 2020-06-14 NOTE — Telephone Encounter (Signed)
Received weekly lab work and noted to have a CK level of 2666. Notified [facility] regarding findings and discontinued Daptomycin and started Vancomycin 1500 mg loading dose x 1 then 1 gram q 8. Lab work orders updated and orders faxed to [facility].

## 2020-06-14 NOTE — Telephone Encounter (Signed)
Received call from Debbie at [facility] wanting to confirm that patient has received Vancomycin before and will not need to go to short stay. RN confirmed per 05/28/20 pharmacy note that the patient has been given IV vancomycin previously and will not need to go to short stay for first dose. Debbie verbalized understanding.   Sandie Ano, RN

## 2020-06-15 ENCOUNTER — Other Ambulatory Visit: Payer: Self-pay

## 2020-06-15 ENCOUNTER — Ambulatory Visit (HOSPITAL_COMMUNITY)
Admission: RE | Admit: 2020-06-15 | Discharge: 2020-06-15 | Disposition: A | Discharge status: Home / Self Care | Payer: Medicare HMO | Source: Ambulatory Visit | Attending: Student | Admitting: Student

## 2020-06-15 ENCOUNTER — Ambulatory Visit: Payer: Medicare HMO | Admitting: Podiatry

## 2020-06-15 DIAGNOSIS — T148XXA Other injury of unspecified body region, initial encounter: Secondary | ICD-10-CM | POA: Diagnosis present

## 2020-06-15 DIAGNOSIS — L089 Local infection of the skin and subcutaneous tissue, unspecified: Secondary | ICD-10-CM | POA: Insufficient documentation

## 2020-06-15 MED ORDER — GADOBUTROL 1 MMOL/ML IV SOLN
7.0000 mL | Freq: Once | INTRAVENOUS | Status: AC | PRN
  Administered 2020-06-15: 7 mL via INTRAVENOUS

## 2020-06-20 ENCOUNTER — Encounter: Payer: Self-pay | Admitting: Internal Medicine

## 2020-06-22 ENCOUNTER — Encounter: Payer: Self-pay | Admitting: Internal Medicine

## 2020-06-22 ENCOUNTER — Other Ambulatory Visit: Payer: Self-pay

## 2020-06-22 ENCOUNTER — Ambulatory Visit (INDEPENDENT_AMBULATORY_CARE_PROVIDER_SITE_OTHER): Payer: Medicare HMO | Admitting: Internal Medicine

## 2020-06-22 VITALS — BP 101/68 | HR 79 | Temp 97.8°F

## 2020-06-22 DIAGNOSIS — Z452 Encounter for adjustment and management of vascular access device: Secondary | ICD-10-CM | POA: Diagnosis not present

## 2020-06-22 DIAGNOSIS — T8149XA Infection following a procedure, other surgical site, initial encounter: Secondary | ICD-10-CM | POA: Diagnosis not present

## 2020-06-22 DIAGNOSIS — Z5181 Encounter for therapeutic drug level monitoring: Secondary | ICD-10-CM | POA: Diagnosis not present

## 2020-06-22 NOTE — Assessment & Plan Note (Signed)
picc line is working well with no issues. This will be removed by [facility] after her last dose.

## 2020-06-22 NOTE — Assessment & Plan Note (Signed)
She has been changed to vancomycin now and trough level elevated and currently holding.  Home health is rechecking tomorrow and will direct when to restart.

## 2020-06-22 NOTE — Progress Notes (Signed)
   Subjective:    Patient ID: April Shaffer, female    DOB: Oct 26, 1950, 70 y.o.   MRN: [id number]  HPI Here for follow up after hospitalization She has a history of lumbar interbody fusion L1-5 with cortical pedical screws done in February 2022.  In march she developed progressive back pain and then drainage noted on 05/23/20 and brought in for surgical debridement on 05/15/20.  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  She was initially started on daptomycin along with oral rifampin and discahrged.  Since discharge, her CK values elevated and she was changed to IV vancomycin.  Her drainage has stopped and the incision site nearly closed.  She stopped taking the rifampin shortly after discharge due to significant nausea associated with it.  She has no associated diarrhea or rash.    Review of Systems  Constitutional: Negative for fever.  Gastrointestinal: Negative for diarrhea and nausea.  Skin: Negative for rash.       Objective:   Physical Exam Cardiovascular:     Rate and Rhythm: Normal rate and regular rhythm.  Pulmonary:     Effort: Pulmonary effort is normal.  Musculoskeletal:     Comments: Back incision nearly closed.  Superior area with some fleshy protrusion but minimal.  No surrounding erythema, no warmth, no drainage.   Neurological:     Mental Status: She is alert.  Psychiatric:        Mood and Affect: Mood normal.   SH: + tobacco       Assessment & Plan:

## 2020-06-22 NOTE — Assessment & Plan Note (Addendum)
Healing well with no concerns on exam and inflammatory markers decreased. Will plan to continue for the 6 weeks then stop antibiotics and observe off of treatment.  She will return if there are new concerns, otherwise she can rtc as needed.  ADDENDUM 07/08/20: inflammatory markers did go up a bit on 5/2 then again trending down 5/9.  Clinically has been doing well.  Will continue with the plan to observe off of antibiotics.  For any worsening pain or new symptoms, she will need a reevaluation by neurosurgery or ID.

## 2020-06-23 ENCOUNTER — Encounter: Payer: Self-pay | Admitting: Internal Medicine

## 2020-06-27 ENCOUNTER — Ambulatory Visit: Payer: Medicare HMO

## 2020-06-27 ENCOUNTER — Encounter: Payer: Self-pay | Admitting: Internal Medicine

## 2020-07-07 ENCOUNTER — Telehealth: Payer: Self-pay

## 2020-07-07 NOTE — Telephone Encounter (Signed)
Received call from [facility], nurse with [facility], wanting to clarify pull PICC orders. Per Dr. Ephriam Knuckles note (06/22/20), PICC line should be removed by home health after last dose.   RN relayed verbal orders per Dr. Luciana Axe that okay to pull PICC after last dose. Orders repeated and verified.   Sandie Ano, RN

## 2020-07-15 ENCOUNTER — Other Ambulatory Visit: Payer: Self-pay

## 2020-07-15 ENCOUNTER — Encounter: Payer: Self-pay | Admitting: Plastic Surgery

## 2020-07-15 ENCOUNTER — Ambulatory Visit (INDEPENDENT_AMBULATORY_CARE_PROVIDER_SITE_OTHER): Payer: Medicare HMO | Admitting: Plastic Surgery

## 2020-07-15 ENCOUNTER — Telehealth: Payer: Self-pay

## 2020-07-15 VITALS — BP 137/82 | HR 72 | Ht 62.0 in | Wt 143.0 lb

## 2020-07-15 DIAGNOSIS — E1169 Type 2 diabetes mellitus with other specified complication: Secondary | ICD-10-CM

## 2020-07-15 DIAGNOSIS — E785 Hyperlipidemia, unspecified: Secondary | ICD-10-CM | POA: Diagnosis not present

## 2020-07-15 DIAGNOSIS — T8149XA Infection following a procedure, other surgical site, initial encounter: Secondary | ICD-10-CM

## 2020-07-15 NOTE — Telephone Encounter (Signed)
Faxed [facility] order : 2x2 collagen dressing, mepilex border-4x4, 2x2 gauze to be changed daily.

## 2020-07-15 NOTE — Progress Notes (Signed)
Patient ID: April Shaffer, female    DOB: 04-Nov-1950, 70 y.o.   MRN: [id number]   Chief Complaint  Patient presents with  . consult    The patient is a 70 year old female here for evaluation of her back.  In February she underwent a spinal surgery.  This included a decompression lumbar laminectomy hemifacetectomy and a foraminotomy between L1 and L5.  She had interbody fusion as well as posterior fixation and intra transverse arthrodesis.  She returned to the OR about a month later with some wound healing.  Since then she has improved.  She has a 1 x 1 x 0.5 cm area that has not yet closed.  There is some hypergranulation.  Little bit of drainage but I do not see anything that indicates an ongoing infection.  She has multiple medical conditions including coronary artery disease, hypertension, diabetes and hyperlipidemia.   Review of Systems  Constitutional: Positive for activity change. Negative for appetite change.  Eyes: Negative.   Respiratory: Negative.  Negative for chest tightness.   Cardiovascular: Negative.  Negative for leg swelling.  Gastrointestinal: Negative.   Genitourinary: Negative.   Musculoskeletal: Positive for back pain.  Skin: Positive for color change and wound.  Hematological: Negative.   Psychiatric/Behavioral: Negative.     Past Medical History:  Diagnosis Date  . Arthritis   . Carotid artery narrowing   . Coronary artery disease    Cardiac catheterization November 2013: 50% ostial LAD stenosis 50% mid stenosis. 30% disease in the left circumflex.  . Diabetes mellitus, type 2 ([place])   . Hyperlipidemia   . Hypertension   . Onychomycosis of toenail 07/31/2016  . Sleep apnea       Had surgery to correct    Past Surgical History:  Procedure Laterality Date  . ABDOMINAL HYSTERECTOMY    . CARDIAC CATHETERIZATION    . CATARACT EXTRACTION, BILATERAL Bilateral 2021   Dr. Sherral Hammers  . ENDARTERECTOMY  09/27/2011   Procedure: RIGT ENDARTERECTOMY CAROTID;   Surgeon: Mal Misty, MD;  Location: [facility];  Service: Vascular;  Laterality: Right;  . EPIDURAL BLOCK INJECTION  02/2008   Drs. Eulis Manly  . gyn surgery  2004   total hysterectomy for mennorhagia,,salpingoophorectomy  . LUMBAR WOUND DEBRIDEMENT N/A 05/25/2020   Procedure: LUMBAR WOUND IRRIGATION AND DEBRIDEMENT;  Surgeon: Eustace Moore, MD;  Location: [facility];  Service: Neurosurgery;  Laterality: N/A;  . POSTERIOR LUMBAR FUSION 4 LEVEL N/A 04/25/2020   Procedure: POSTERIOR LUMBAR INTERBODY FUSION LUMBAR ONE-TWO, LUMBAR TWO-THREE, LUMBAR THREE-FOUR,LUMBAR FOUR-FIVE.;  Surgeon: Eustace Moore, MD;  Location: [facility];  Service: Neurosurgery;  Laterality: N/A;  posterior  . TONSILLECTOMY    . TOTAL ABDOMINAL HYSTERECTOMY W/ BILATERAL SALPINGOOPHORECTOMY Bilateral 2000  . TOTAL HIP ARTHROPLASTY Right 08/22/2018   Procedure: TOTAL HIP ARTHROPLASTY;  Surgeon: Earlie Server, MD;  Location: [facility];  Service: Orthopedics;  Laterality: Right;  Marland Kitchen VARICOSE VEIN SURGERY  2008   stripping      Current Outpatient Medications:  .  acetaminophen (TYLENOL) 650 MG CR tablet, Take 1,300 mg by mouth daily as needed for pain., Disp: , Rfl:  .  allopurinol (ZYLOPRIM) 300 MG tablet, Take 1 tablet (300 mg total) by mouth daily., Disp: 90 tablet, Rfl: 3 .  amLODipine (NORVASC) 5 MG tablet, Take 1 tablet (5 mg total) by mouth 2 (two) times daily., Disp: 60 tablet, Rfl: 1 .  aspirin EC 81 MG tablet, Take 81 mg by mouth daily., Disp: ,  Rfl:  .  atorvastatin (LIPITOR) 80 MG tablet, TAKE 1 TABLET BY MOUTH ONCE DAILY WITH  EVENING  MEAL (Patient taking differently: Take 80 mg by mouth daily.), Disp: 90 tablet, Rfl: 3 .  blood glucose meter kit and supplies KIT, Check sugars twice daily, Disp: 1 each, Rfl: 0 .  carvedilol (COREG CR) 20 MG 24 hr capsule, Take 1 capsule (20 mg total) by mouth daily., Disp: 90 capsule, Rfl: 3 .  celecoxib (CELEBREX) 100 MG capsule, Take 100 mg by mouth 2 (two) times daily as needed  for mild pain., Disp: , Rfl:  .  clopidogrel (PLAVIX) 75 MG tablet, Take 75 mg by mouth daily., Disp: , Rfl:  .  diphenhydrAMINE (BENADRYL) 25 MG tablet, Take 25 mg by mouth every 6 (six) hours as needed for itching or allergies., Disp: , Rfl:  .  doxycycline (VIBRAMYCIN) 100 MG capsule, Take by mouth., Disp: , Rfl:  .  ezetimibe (ZETIA) 10 MG tablet, TAKE 1 TABLET EVERY DAY (NEED MD APPOINTMENT) (Patient taking differently: Take 10 mg by mouth daily.), Disp: 60 tablet, Rfl: 6 .  ferrous sulfate 325 (65 FE) MG tablet, Take 325 mg by mouth daily with breakfast., Disp: , Rfl:  .  gabapentin (NEURONTIN) 300 MG capsule, 1 cap by mouth in morning, 1 cap by mouth midday and 2 caps by mouth before bedtime. (Patient taking differently: Take 300-600 mg by mouth See admin instructions. Take 1 capsule (300 mg) by mouth in the morning & take 2 capsules (600 mg) by mouth at bedtime), Disp: 360 capsule, Rfl: 3 .  glucose blood test strip, Check sugars twice daily, Disp: 100 each, Rfl: 11 .  metFORMIN (GLUCOPHAGE) 500 MG tablet, Take 500 mg by mouth 2 (two) times daily with a meal., Disp: , Rfl:  .  methocarbamol (ROBAXIN) 500 MG tablet, Take 1 tablet (500 mg total) by mouth 4 (four) times daily. (Patient taking differently: Take 500 mg by mouth daily.), Disp: 45 tablet, Rfl: 0 .  MITIGARE 0.6 MG CAPS, TAKE 1 CAPSULE TWICE DAILY AS NEEDED FOR GOUT FLARE AND HOLD ATORVASTATIN ON DAYS TAKING MITIGARE (Patient taking differently: Take 0.6 mg by mouth 2 (two) times daily as needed (gout flares).), Disp: 30 capsule, Rfl: 1 .  oxyCODONE-acetaminophen (PERCOCET) 10-325 MG tablet, Take 1 tablet by mouth every 6 (six) hours as needed for pain., Disp: 30 tablet, Rfl: 0   Objective:   Vitals:   07/15/20 1012  BP: 137/82  Pulse: 72  SpO2: 97%    Physical Exam Vitals and nursing note reviewed.  Constitutional:      Appearance: Normal appearance.  HENT:     Head: Normocephalic and atraumatic.  Eyes:     Pupils:  Pupils are equal, round, and reactive to light.  Cardiovascular:     Rate and Rhythm: Normal rate.     Pulses: Normal pulses.  Pulmonary:     Effort: Pulmonary effort is normal. No respiratory distress.  Musculoskeletal:        General: Swelling present. No deformity.  Skin:    Capillary Refill: Capillary refill takes less than 2 seconds.     Coloration: Skin is not jaundiced or pale.     Findings: Lesion present. No bruising.  Neurological:     General: No focal deficit present.     Mental Status: She is alert and oriented to person, place, and time.  Psychiatric:        Mood and Affect: Mood normal.  Behavior: Behavior normal.     Assessment & Plan:  Wound infection after surgery  Hyperlipidemia associated with type 2 diabetes mellitus (HCC)   Silver nitrate used to cauterize the hypergranulation tissue in the 1 x 1 cm wound of her back.  I recommend collagen dressing treatments.  We will get prism to send the dressings to her house.  I like her to give this a try for 1 to 2 weeks.  Surgery is certainly an option.  She would like to try dressing changes before she moves to any surgical operation at this time.  I think this is reasonable.  We also talked about avoiding tobacco, improving her nutrition and making sure she keeps her blood sugars under control.    Total time: 45 minutes. This includes time spent with the patient during the visit as well as time spent before and after the visit reviewing the chart, documenting the encounter and ordering pertinent studies. and literature emailed to the patient.    Pictures were obtained of the patient and placed in the chart with the patient's or guardian's permission.   Kirby, DO

## 2020-07-16 ENCOUNTER — Other Ambulatory Visit: Payer: Self-pay

## 2020-07-16 DIAGNOSIS — I2510 Atherosclerotic heart disease of native coronary artery without angina pectoris: Secondary | ICD-10-CM

## 2020-07-18 ENCOUNTER — Telehealth: Payer: Self-pay | Admitting: Plastic Surgery

## 2020-07-18 NOTE — Telephone Encounter (Signed)
Returned patients call. Advised the dressing she is using for her back will not interfere with her PT. Reminded her follow up appointment 08/02/2020 at 2:20. Patient agreed and understood with plan.

## 2020-07-18 NOTE — Telephone Encounter (Signed)
Patient called to advise that she is starting physical therapy tomorrow and wanted to make sure the dressing that was put on at her last visit (07/15/20) would not be bothered by her doing the physical therapy. Should she avoid certain moves or is she okay to do whatever the therapists have her doing? Please call her to advise.

## 2020-07-19 ENCOUNTER — Other Ambulatory Visit: Payer: Self-pay

## 2020-07-19 ENCOUNTER — Encounter: Payer: Self-pay | Admitting: Physical Therapy

## 2020-07-19 ENCOUNTER — Ambulatory Visit: Payer: Medicare HMO | Attending: Student | Admitting: Physical Therapy

## 2020-07-19 DIAGNOSIS — M6281 Muscle weakness (generalized): Secondary | ICD-10-CM | POA: Diagnosis present

## 2020-07-19 DIAGNOSIS — G8929 Other chronic pain: Secondary | ICD-10-CM | POA: Insufficient documentation

## 2020-07-19 DIAGNOSIS — M25651 Stiffness of right hip, not elsewhere classified: Secondary | ICD-10-CM | POA: Insufficient documentation

## 2020-07-19 DIAGNOSIS — R293 Abnormal posture: Secondary | ICD-10-CM | POA: Diagnosis present

## 2020-07-19 DIAGNOSIS — M545 Low back pain, unspecified: Secondary | ICD-10-CM | POA: Insufficient documentation

## 2020-07-19 DIAGNOSIS — R262 Difficulty in walking, not elsewhere classified: Secondary | ICD-10-CM | POA: Diagnosis present

## 2020-07-19 DIAGNOSIS — M25652 Stiffness of left hip, not elsewhere classified: Secondary | ICD-10-CM | POA: Diagnosis present

## 2020-07-19 NOTE — Therapy (Signed)
[facility] [address] [place], [place], [address] Phone: [phone]   Fax:  [phone]  Physical Therapy Evaluation  Patient Details  Name: April Shaffer MRN: [id number] Date of Birth: April 04, 1950 Referring Provider (PT): Verlin Dike   Encounter Date: 07/19/2020   PT End of Session - 07/19/20 0954    Visit Number 1    Number of Visits 17    Date for PT Re-Evaluation 09/13/20    Authorization Type [employer] - Medicaid    PT Start Time 0913    PT Stop Time 0950    PT Time Calculation (min) 37 min    Activity Tolerance Patient tolerated treatment well    Behavior During Therapy [facility] for tasks assessed/performed           Past Medical History:  Diagnosis Date  . Arthritis   . Carotid artery narrowing   . Coronary artery disease    Cardiac catheterization November 2013: 50% ostial LAD stenosis 50% mid stenosis. 30% disease in the left circumflex.  . Diabetes mellitus, type 2 (HCC)   . Hyperlipidemia   . Hypertension   . Onychomycosis of toenail 07/31/2016  . Sleep apnea       Had surgery to correct    Past Surgical History:  Procedure Laterality Date  . ABDOMINAL HYSTERECTOMY    . CARDIAC CATHETERIZATION    . CATARACT EXTRACTION, BILATERAL Bilateral 2021   Dr. Joseph Art  . ENDARTERECTOMY  09/27/2011   Procedure: RIGT ENDARTERECTOMY CAROTID;  Surgeon: Pryor Ochoa, MD;  Location: [facility] OR;  Service: Vascular;  Laterality: Right;  . EPIDURAL BLOCK INJECTION  02/2008   Drs. Dorthula Nettles  . gyn surgery  2004   total hysterectomy for mennorhagia,,salpingoophorectomy  . LUMBAR WOUND DEBRIDEMENT N/A 05/25/2020   Procedure: LUMBAR WOUND IRRIGATION AND DEBRIDEMENT;  Surgeon: Tia Alert, MD;  Location: [facility] OR;  Service: Neurosurgery;  Laterality: N/A;  . POSTERIOR LUMBAR FUSION 4 LEVEL N/A 04/25/2020   Procedure: POSTERIOR LUMBAR INTERBODY FUSION LUMBAR ONE-TWO, LUMBAR TWO-THREE, LUMBAR THREE-FOUR,LUMBAR  FOUR-FIVE.;  Surgeon: Tia Alert, MD;  Location: [facility] OR;  Service: Neurosurgery;  Laterality: N/A;  posterior  . TONSILLECTOMY    . TOTAL ABDOMINAL HYSTERECTOMY W/ BILATERAL SALPINGOOPHORECTOMY Bilateral 2000  . TOTAL HIP ARTHROPLASTY Right 08/22/2018   Procedure: TOTAL HIP ARTHROPLASTY;  Surgeon: Frederico Hamman, MD;  Location: [facility];  Service: Orthopedics;  Laterality: Right;  Marland Kitchen VARICOSE VEIN SURGERY  2008   stripping    There were no vitals filed for this visit.    Subjective Assessment - 07/19/20 0918    Subjective Patient reports she was getting home health until last Thursday with PICC line for antibiotics, PT/OT.  "I'm coming along finally." There is still a small area of drainage that they may have to I&D again.  Patient states she mostly using cane now but first thing in the morning uses the walker still. Patient wondering if BLT restrictions are lifted.    Limitations Lifting;Walking;House hold activities;Standing    How long can you stand comfortably? 15 min    Patient Stated Goals Strength and improved walking.    Currently in Pain? Yes    Pain Score 0-No pain   Just took medication.  Max pain this week=8/10   Pain Location Back    Pain Orientation Lower;Right    Pain Descriptors / Indicators Aching;Shooting    Pain Radiating Towards R lateral thigh    Pain Onset More than a month ago  Pain Frequency Intermittent    Aggravating Factors  Laying in bed, standing    Pain Relieving Factors Medication, ice, heat, biofreeze    Effect of Pain on Daily Activities Limits high level tasks and prolonged standing.    Multiple Pain Sites No              [facility] PT Assessment - 07/19/20 0001      Assessment   Medical Diagnosis Low Back Pain s/p fusion    Referring Provider (PT) Verlin Dike    Onset Date/Surgical Date 05/25/20    Next MD Visit August 02, 2020    Prior Therapy Yes, THA and back      Precautions   Precautions Other (comment)   spinal BLT      Restrictions   Weight Bearing Restrictions No      Balance Screen   Has the patient fallen in the past 6 months No    Has the patient had a decrease in activity level because of a fear of falling?  No    Is the patient reluctant to leave their home because of a fear of falling?  No      Home Environment   Living Environment Private residence    Living Arrangements Spouse/significant other    Type of Home House    Home Access Stairs to enter   2 STE   Home Layout One level      Observation/Other Assessments   Observations No apparent distress    Focus on Therapeutic Outcomes (FOTO)  45%   64% predicted     Sensation   Light Touch Appears Intact      Posture/Postural Control   Posture Comments rounded shoulders, head forward      AROM   Lumbar Flexion 40cm finger tips to floor    Lumbar - Right Side Bend 61cm    Lumbar - Left Side Bend 49cm      Strength   Right Hip Flexion 4-/5    Right Hip ABduction 3+/5    Left Hip Flexion 4/5    Left Hip ABduction 3+/5    Right Ankle Dorsiflexion 4-/5    Left Ankle Dorsiflexion 4-/5      Palpation   Palpation comment TTP lumbar paraspinals      Ambulation/Gait   Ambulation Distance (Feet) 200 Feet    Assistive device Straight cane    Gait Pattern Step-through pattern;Antalgic    Ambulation Surface Level    Gait velocity moderatley slow                      Objective measurements completed on examination: See above findings.       [facility] Adult PT Treatment/Exercise - 07/19/20 0001      Lumbar Exercises: Supine   Bridge 10 reps      Knee/Hip Exercises: Standing   Hip Flexion 10 reps    Hip Flexion Limitations Marching    Hip Abduction 10 reps      Knee/Hip Exercises: Seated   Clamshell with TheraBand Red   x10                 PT Education - 07/19/20 1126    Education Details Results of Evaluation, POC, HEP, FOTO results.    Methods Explanation;Demonstration;Verbal cues;Handout     Comprehension Verbalized understanding;Returned demonstration            PT Short Term Goals - 07/19/20 1406      PT  SHORT TERM GOAL #1   Title Patient will be independent with initial HEP for symptom management and improved ADLs.    Baseline issued at eval    Time 2    Period Weeks    Status New    Target Date 09/13/20      PT SHORT TERM GOAL #2   Title Patient to be instructed on proper body mechanics to reduce pain during functional tasks.    Baseline Still under spinal precautions for limited BLT.    Time 3    Period Weeks    Status New    Target Date 08/09/20      PT SHORT TERM GOAL #3   Title BERG balance to be administered by PT in first two weeks.    Baseline NONE    Time 2    Period Weeks    Status New    Target Date 08/02/20             PT Long Term Goals - 07/19/20 1410      PT LONG TERM GOAL #1   Title Pt will be able to improve FOTO score to 64% limitation to demo greater patient perception of functional mobility.    Baseline 45%    Time 8    Period Weeks    Status New      PT LONG TERM GOAL #2   Title Patient will improve BERG Balance by at least 5 points per significance of reduced falls risk.    Baseline TBD in first two weeks.    Time 8    Period Weeks    Status New      PT LONG TERM GOAL #3   Title Patient will reports ability to cook entire meal >30 min with no more than 1 rest break.    Baseline 15 min standing in kitchen with minimal lifting.    Time 8    Period Weeks    Status New    Target Date 09/13/20      PT LONG TERM GOAL #4   Title Patient will sleep through the night without awakening from pain, reporting no more than 3/10 pain in the morning.    Baseline Frequent awakening with pain while changing positions while sleeping.    Time 8    Period Weeks    Status New    Target Date 09/13/20                  Plan - 07/19/20 1349    Clinical Impression Statement 70 yo female referred to OPPT with h/o chronic low  back pain, now s/p 4 level lumbar fusion and subsequent I&D secondary infection.  Superior incision with pin hole opening with continued drainage that may require further I&D.  Patient has had several weeks of Home Health PT/OT/RN and PICC line for antibiotics ending last Thursday. Patient presented to PT evaluation ambulating with SPC, slow pace, mild R antalgic and lateral sway with decreased foot clearance.  Patient with generalized LE weakness, decrased trunk AROM and decreased balance. Patient currently continues to follow spinal precautions of limited bending, lifting, twisting.  Patient would benefit from skilled PT to address deficits and maximize safe funcitonal moiblity with return to community mobility with decreased pain and difficulty.    Personal Factors and Comorbidities Comorbidity 3+    Comorbidities HTN, CAD, LE variosities, DM, OA, chronic LBP with R sciatica, DDD and carpel tunnel.    Examination-Activity Limitations Bathing;Bed Mobility;Bend;Carry;Dressing;Lift;Locomotion Level;Sleep;Squat;Stairs;Stand  Examination-Participation Restrictions Church;Cleaning;Community Activity;Meal Prep;Laundry    Stability/Clinical Decision Making Stable/Uncomplicated    Clinical Decision Making Low    Rehab Potential Good    PT Frequency 2x / week    PT Duration 8 weeks    PT Treatment/Interventions ADLs/Self Care Home Management;Cryotherapy;Electrical Stimulation;Moist Heat;Gait training;Stair training;Functional mobility training;Therapeutic activities;Therapeutic exercise;Balance training;Neuromuscular re-education;Patient/family education;Manual techniques;Scar mobilization;Passive range of motion;Taping    PT Next Visit Plan Assess HEP affects and progress as appropriate, initiate stair training, BERG balance.    PT Home Exercise Plan [id number]    Consulted and Agree with Plan of Care Patient           Patient will benefit from skilled therapeutic intervention in order to improve the  following deficits and impairments:  Abnormal gait,Decreased balance,Decreased mobility,Decreased skin integrity,Difficulty walking,Decreased range of motion,Decreased scar mobility,Improper body mechanics,Decreased activity tolerance,Decreased strength,Impaired flexibility,Pain  Visit Diagnosis: Chronic bilateral low back pain, unspecified whether sciatica present  Muscle weakness (generalized)  Abnormal posture  Stiffness of right hip, not elsewhere classified  Stiffness of left hip, not elsewhere classified  Difficulty in walking, not elsewhere classified     Problem List Patient Active Problem List   Diagnosis Date Noted  . PICC (peripherally inserted central catheter) in place 06/22/2020  . Therapeutic drug monitoring 06/22/2020  . Wound infection after surgery 05/25/2020  . Wound drainage 05/24/2020  . S/P lumbar fusion 04/25/2020  . Microalbuminuria due to type 2 diabetes mellitus (HCC) 08/07/2019  . Primary localized osteoarthritis of right hip 08/22/2018  . Carpal tunnel syndrome, bilateral 08/06/2018  . Type 2 diabetes mellitus with peripheral neuropathy (HCC) 08/06/2018  . Degenerative joint disease of right hip 08/06/2018  . Preoperative clearance 03/26/2018  . Upper back pain on left side 10/30/2016  . Encounter for postoperative carotid endarterectomy surveillance 12/07/2015  . Varicosities of leg 06/03/2015  . Acute left lumbar radiculopathy 04/15/2014  . Chronic sciatica of right side 03/17/2013  . Diabetic peripheral neuropathy (HCC) 03/17/2013  . Coronary artery disease   . Carotid artery stenosis s/p CEA 2013 10/16/2011  . Carotid artery occlusion without infarction, right 09/17/2011  . Carotid bruit 08/28/2011  . Gout 04/13/2011  . DM type 2 with diabetic peripheral neuropathy (HCC) 04/02/2008  . Hyperlipidemia associated with type 2 diabetes mellitus (HCC) 04/02/2008  . Essential hypertension 04/02/2008  . DEGENERATIVE DISC DISEASE, LUMBOSACRAL SPINE  04/02/2008    Myrla Halsted, PT 07/19/2020, 2:21 PM  [facility] [address] [place], [place], [address] Phone: [phone]   Fax:  [phone]  Name: April Shaffer MRN: [id number] Date of Birth: 1951-01-18

## 2020-07-22 ENCOUNTER — Encounter: Payer: Self-pay | Admitting: Physical Therapy

## 2020-07-22 ENCOUNTER — Ambulatory Visit: Payer: Medicare HMO | Admitting: Physical Therapy

## 2020-07-22 ENCOUNTER — Other Ambulatory Visit: Payer: Self-pay

## 2020-07-22 DIAGNOSIS — G8929 Other chronic pain: Secondary | ICD-10-CM

## 2020-07-22 DIAGNOSIS — R293 Abnormal posture: Secondary | ICD-10-CM

## 2020-07-22 DIAGNOSIS — M6281 Muscle weakness (generalized): Secondary | ICD-10-CM

## 2020-07-22 DIAGNOSIS — M25651 Stiffness of right hip, not elsewhere classified: Secondary | ICD-10-CM

## 2020-07-22 DIAGNOSIS — M545 Low back pain, unspecified: Secondary | ICD-10-CM | POA: Diagnosis not present

## 2020-07-22 DIAGNOSIS — M25652 Stiffness of left hip, not elsewhere classified: Secondary | ICD-10-CM

## 2020-07-22 NOTE — Therapy (Addendum)
[facility] [address] [place], [place], [address] Phone: [phone]   Fax:  [phone]  Physical Therapy Treatment  Patient Details  Name: April Shaffer MRN: [id number] Date of Birth: [birth year] Referring Provider (PT): Verlin Dike   Encounter Date: 07/22/2020   PT End of Session - 07/22/20 1232    Visit Number 2    Number of Visits 17    Date for PT Re-Evaluation 09/13/20    Authorization Type [employer] - Medicaid    PT Start Time 1230    PT Stop Time 1312    PT Time Calculation (min) 42 min           Past Medical History:  Diagnosis Date  . Arthritis   . Carotid artery narrowing   . Coronary artery disease    Cardiac catheterization November 2013: 50% ostial LAD stenosis 50% mid stenosis. 30% disease in the left circumflex.  . Diabetes mellitus, type 2 (HCC)   . Hyperlipidemia   . Hypertension   . Onychomycosis of toenail 07/31/2016  . Sleep apnea       Had surgery to correct    Past Surgical History:  Procedure Laterality Date  . ABDOMINAL HYSTERECTOMY    . CARDIAC CATHETERIZATION    . CATARACT EXTRACTION, BILATERAL Bilateral 2021   Dr. Joseph Art  . ENDARTERECTOMY  09/27/2011   Procedure: RIGT ENDARTERECTOMY CAROTID;  Surgeon: Pryor Ochoa, MD;  Location: [facility] OR;  Service: Vascular;  Laterality: Right;  . EPIDURAL BLOCK INJECTION  02/2008   Drs. Dorthula Nettles  . gyn surgery  2004   total hysterectomy for mennorhagia,,salpingoophorectomy  . LUMBAR WOUND DEBRIDEMENT N/A 05/25/2020   Procedure: LUMBAR WOUND IRRIGATION AND DEBRIDEMENT;  Surgeon: Tia Alert, MD;  Location: [facility] OR;  Service: Neurosurgery;  Laterality: N/A;  . POSTERIOR LUMBAR FUSION 4 LEVEL N/A 04/25/2020   Procedure: POSTERIOR LUMBAR INTERBODY FUSION LUMBAR ONE-TWO, LUMBAR TWO-THREE, LUMBAR THREE-FOUR,LUMBAR FOUR-FIVE.;  Surgeon: Tia Alert, MD;  Location: [facility] OR;  Service: Neurosurgery;  Laterality: N/A;  posterior  .  TONSILLECTOMY    . TOTAL ABDOMINAL HYSTERECTOMY W/ BILATERAL SALPINGOOPHORECTOMY Bilateral 2000  . TOTAL HIP ARTHROPLASTY Right 08/22/2018   Procedure: TOTAL HIP ARTHROPLASTY;  Surgeon: Frederico Hamman, MD;  Location: [facility] ORS;  Service: Orthopedics;  Laterality: Right;  Marland Kitchen VARICOSE VEIN SURGERY  2008   stripping    There were no vitals filed for this visit.   Subjective Assessment - 07/22/20 1231    Subjective I took my medicine so I am not in pain. My pain is getting better, about a 5/10 this morning when I got up.    Currently in Pain? No/denies              [facility] PT Assessment - 07/22/20 0001      Standardized Balance Assessment   Standardized Balance Assessment Berg Balance Test      Berg Balance Test   Sit to Stand Able to stand  independently using hands    Standing Unsupported Able to stand safely 2 minutes    Sitting with Back Unsupported but Feet Supported on Floor or Stool Able to sit safely and securely 2 minutes    Stand to Sit Sits safely with minimal use of hands    Transfers Able to transfer safely, definite need of hands    Standing Unsupported with Eyes Closed Able to stand 10 seconds safely    Standing Unsupported with Feet Together Able to place feet together  independently and stand 1 minute safely    From Standing, Reach Forward with Outstretched Arm Can reach forward >12 cm safely (5")    From Standing Position, Pick up Object from Floor Able to pick up shoe safely and easily    From Standing Position, Turn to Look Behind Over each Shoulder Looks behind from both sides and weight shifts well    Turn 360 Degrees Able to turn 360 degrees safely but slowly    Standing Unsupported, Alternately Place Feet on Step/Stool Able to stand independently and safely and complete 8 steps in 20 seconds    Standing Unsupported, One Foot in Front Able to plae foot ahead of the other independently and hold 30 seconds    Standing on One Leg Able to lift leg independently and hold  equal to or more than 3 seconds    Total Score 48    Berg comment: 48/56, using SPC indoor and outdoor                         [facility] Adult PT Treatment/Exercise - 07/22/20 0001      Transfers   Transfers Supine to Sit    Supine to Sit 7: Independent    Comments via log roll from side      Lumbar Exercises: Aerobic   Nustep L4 x 7 minutes      Lumbar Exercises: Supine   Bridge 10 reps    Bridge Limitations 2 sets      Knee/Hip Exercises: Standing   Hip Flexion 10 reps;2 sets    Hip Flexion Limitations Marching    Hip Abduction 10 reps;Both;2 sets                    PT Short Term Goals - 07/19/20 1406      PT SHORT TERM GOAL #1   Title Patient will be independent with initial HEP for symptom management and improved ADLs.    Baseline issued at eval    Time 2    Period Weeks    Status New    Target Date 09/13/20      PT SHORT TERM GOAL #2   Title Patient to be instructed on proper body mechanics to reduce pain during functional tasks.    Baseline Still under spinal precautions for limited BLT.    Time 3    Period Weeks    Status New    Target Date 08/09/20      PT SHORT TERM GOAL #3   Title BERG balance to be administered by PT in first two weeks.    Baseline NONE    Time 2    Period Weeks    Status New    Target Date 08/02/20             PT Long Term Goals - 07/19/20 1410      PT LONG TERM GOAL #1   Title Pt will be able to improve FOTO score to 64% limitation to demo greater patient perception of functional mobility.    Baseline 45%    Time 8    Period Weeks    Status New      PT LONG TERM GOAL #2   Title Patient will improve BERG Balance by at least 5 points per significance of reduced falls risk.    Baseline TBD in first two weeks.    Time 8    Period Weeks    Status New  PT LONG TERM GOAL #3   Title Patient will reports ability to cook entire meal >30 min with no more than 1 rest break.    Baseline 15 min  standing in kitchen with minimal lifting.    Time 8    Period Weeks    Status New    Target Date 09/13/20      PT LONG TERM GOAL #4   Title Patient will sleep through the night without awakening from pain, reporting no more than 3/10 pain in the morning.    Baseline Frequent awakening with pain while changing positions while sleeping.    Time 8    Period Weeks    Status New    Target Date 09/13/20                 Plan - 07/22/20 1309    Clinical Impression Statement Pt arrives reporting compliance with HEP. Reviewed HEP and captured BERG at 48/56. Pt is currently using SPC full time indoor and outdoors. She tolearted the session well without c/o pain.    PT Next Visit Plan Assess HEP affects and progress as appropriate, initiate stair training, balance    PT Home Exercise Plan [email or web address]           Patient will benefit from skilled therapeutic intervention in order to improve the following deficits and impairments:  Abnormal gait,Decreased balance,Decreased mobility,Decreased skin integrity,Difficulty walking,Decreased range of motion,Decreased scar mobility,Improper body mechanics,Decreased activity tolerance,Decreased strength,Impaired flexibility,Pain  Visit Diagnosis: Chronic bilateral low back pain, unspecified whether sciatica present  Muscle weakness (generalized)  Abnormal posture  Stiffness of right hip, not elsewhere classified  Stiffness of left hip, not elsewhere classified     Problem List Patient Active Problem List   Diagnosis Date Noted  . PICC (peripherally inserted central catheter) in place 06/22/2020  . Therapeutic drug monitoring 06/22/2020  . Wound infection after surgery 05/25/2020  . Wound drainage 05/24/2020  . S/P lumbar fusion 04/25/2020  . Microalbuminuria due to type 2 diabetes mellitus (HCC) 08/07/2019  . Primary localized osteoarthritis of right hip 08/22/2018  . Carpal tunnel syndrome, bilateral 08/06/2018  . Type 2 diabetes  mellitus with peripheral neuropathy (HCC) 08/06/2018  . Degenerative joint disease of right hip 08/06/2018  . Preoperative clearance 03/26/2018  . Upper back pain on left side 10/30/2016  . Encounter for postoperative carotid endarterectomy surveillance 12/07/2015  . Varicosities of leg 06/03/2015  . Acute left lumbar radiculopathy 04/15/2014  . Chronic sciatica of right side 03/17/2013  . Diabetic peripheral neuropathy (HCC) 03/17/2013  . Coronary artery disease   . Carotid artery stenosis s/p CEA 2013 10/16/2011  . Carotid artery occlusion without infarction, right 09/17/2011  . Carotid bruit 08/28/2011  . Gout 04/13/2011  . DM type 2 with diabetic peripheral neuropathy (HCC) 04/02/2008  . Hyperlipidemia associated with type 2 diabetes mellitus (HCC) 04/02/2008  . Essential hypertension 04/02/2008  . DEGENERATIVE DISC DISEASE, LUMBOSACRAL SPINE 04/02/2008    Sherrie Mustache , [place] 07/22/2020, 1:14 PM  [facility] [address] [place], [place], [address] Phone: [phone]   Fax:  [phone]  Name: ALDA GAULTNEY MRN: [id number] Date of Birth: [birth year]

## 2020-07-26 ENCOUNTER — Ambulatory Visit: Payer: Medicare HMO | Admitting: Physical Therapy

## 2020-07-26 ENCOUNTER — Encounter: Payer: Self-pay | Admitting: Physical Therapy

## 2020-07-26 ENCOUNTER — Other Ambulatory Visit: Payer: Self-pay

## 2020-07-26 DIAGNOSIS — M25652 Stiffness of left hip, not elsewhere classified: Secondary | ICD-10-CM

## 2020-07-26 DIAGNOSIS — R262 Difficulty in walking, not elsewhere classified: Secondary | ICD-10-CM

## 2020-07-26 DIAGNOSIS — M545 Low back pain, unspecified: Secondary | ICD-10-CM | POA: Diagnosis not present

## 2020-07-26 DIAGNOSIS — R293 Abnormal posture: Secondary | ICD-10-CM

## 2020-07-26 DIAGNOSIS — M25651 Stiffness of right hip, not elsewhere classified: Secondary | ICD-10-CM

## 2020-07-26 DIAGNOSIS — M6281 Muscle weakness (generalized): Secondary | ICD-10-CM

## 2020-07-26 NOTE — Therapy (Signed)
[facility] Outpatient Rehabilitation [facility] [address] [place], [place], [address] Phone: [phone]   Fax:  [phone]  Physical Therapy Treatment  Patient Details  Name: April Shaffer MRN: [id number] Date of Birth: Jan 15, 1951 Referring Provider (PT): Verlin Dike   Encounter Date: 07/26/2020   PT End of Session - 07/26/20 1619    Visit Number 3    Number of Visits 17    Date for PT Re-Evaluation 09/13/20    Authorization Type [employer] Medicare - Medicaid    PT Start Time 1620    PT Stop Time 1702    PT Time Calculation (min) 42 min    Activity Tolerance Patient tolerated treatment well    Behavior During Therapy [facility] for tasks assessed/performed           Past Medical History:  Diagnosis Date  . Arthritis   . Carotid artery narrowing   . Coronary artery disease    Cardiac catheterization November 2013: 50% ostial LAD stenosis 50% mid stenosis. 30% disease in the left circumflex.  . Diabetes mellitus, type 2 (HCC)   . Hyperlipidemia   . Hypertension   . Onychomycosis of toenail 07/31/2016  . Sleep apnea       Had surgery to correct    Past Surgical History:  Procedure Laterality Date  . ABDOMINAL HYSTERECTOMY    . CARDIAC CATHETERIZATION    . CATARACT EXTRACTION, BILATERAL Bilateral 2021   Dr. Joseph Art  . ENDARTERECTOMY  09/27/2011   Procedure: RIGT ENDARTERECTOMY CAROTID;  Surgeon: Pryor Ochoa, MD;  Location: [facility] OR;  Service: Vascular;  Laterality: Right;  . EPIDURAL BLOCK INJECTION  02/2008   Drs. Dorthula Nettles  . gyn surgery  2004   total hysterectomy for mennorhagia,,salpingoophorectomy  . LUMBAR WOUND DEBRIDEMENT N/A 05/25/2020   Procedure: LUMBAR WOUND IRRIGATION AND DEBRIDEMENT;  Surgeon: Tia Alert, MD;  Location: [facility] OR;  Service: Neurosurgery;  Laterality: N/A;  . POSTERIOR LUMBAR FUSION 4 LEVEL N/A 04/25/2020   Procedure: POSTERIOR LUMBAR INTERBODY FUSION LUMBAR ONE-TWO, LUMBAR TWO-THREE, LUMBAR THREE-FOUR,LUMBAR  FOUR-FIVE.;  Surgeon: Tia Alert, MD;  Location: [facility] OR;  Service: Neurosurgery;  Laterality: N/A;  posterior  . TONSILLECTOMY    . TOTAL ABDOMINAL HYSTERECTOMY W/ BILATERAL SALPINGOOPHORECTOMY Bilateral 2000  . TOTAL HIP ARTHROPLASTY Right 08/22/2018   Procedure: TOTAL HIP ARTHROPLASTY;  Surgeon: Frederico Hamman, MD;  Location: [facility] ORS;  Service: Orthopedics;  Laterality: Right;  Marland Kitchen VARICOSE VEIN SURGERY  2008   stripping    There were no vitals filed for this visit.   Subjective Assessment - 07/26/20 1623    Subjective Patient reports she did good.  "Shanda Bumps worked me real good, I was sore."    Limitations Lifting;Walking;House hold activities;Standing    Patient Stated Goals Strength and improved walking.    Currently in Pain? Yes    Pain Score 3     Pain Location Back    Pain Descriptors / Indicators Aching;Sore    Pain Radiating Towards R thigh Sat and Vladimir Faster PT Assessment - 07/26/20 0001      Assessment   Medical Diagnosis Low Back Pain s/p fusion    Referring Provider (PT) Verlin Dike    Onset Date/Surgical Date 05/25/20                         [facility] Adult PT Treatment/Exercise - 07/26/20 0001  Ambulation/Gait   Gait Comments Lateral step over cane R/L, then side stepping along bar 3x12' ea dir      Lumbar Exercises: Aerobic   Nustep L4x88min      Lumbar Exercises: Standing   Other Standing Lumbar Exercises Alt tap ups to styrofoam cut for increased hip flexion with marching x10ea      Lumbar Exercises: Supine   Bridge 10 reps    Bridge Limitations 2 sets      Knee/Hip Exercises: Stretches   Passive Hamstring Stretch 2 reps;20 seconds      Knee/Hip Exercises: Standing   Hip Abduction 10 reps;Both;2 sets    Hip Extension 2 sets;10 reps      Knee/Hip Exercises: Seated   Clamshell with TheraBand Red   x20     Manual Therapy   Manual Therapy Soft tissue mobilization    Manual therapy comments lower thor and lumbar  paraspinals, c/o "soreness" today                  PT Education - 07/26/20 1712    Education Details Good compliance with HEP, additions for standing hip ext and seated hamstring stretch.  Patient given L medium heel lift to try and instructed to only walk 20 min at most until use to it and remove if it increases hip/LB discomfort.    Person(s) Educated Patient    Methods Explanation;Demonstration;Verbal cues;Handout    Comprehension Returned demonstration;Verbalized understanding            PT Short Term Goals - 07/26/20 1720      PT SHORT TERM GOAL #1   Target Date 08/02/20             PT Long Term Goals - 07/19/20 1410      PT LONG TERM GOAL #1   Title Pt will be able to improve FOTO score to 64% limitation to demo greater patient perception of functional mobility.    Baseline 45%    Time 8    Period Weeks    Status New      PT LONG TERM GOAL #2   Title Patient will improve BERG Balance by at least 5 points per significance of reduced falls risk.    Baseline TBD in first two weeks.    Time 8    Period Weeks    Status New      PT LONG TERM GOAL #3   Title Patient will reports ability to cook entire meal >30 min with no more than 1 rest break.    Baseline 15 min standing in kitchen with minimal lifting.    Time 8    Period Weeks    Status New    Target Date 09/13/20      PT LONG TERM GOAL #4   Title Patient will sleep through the night without awakening from pain, reporting no more than 3/10 pain in the morning.    Baseline Frequent awakening with pain while changing positions while sleeping.    Time 8    Period Weeks    Status New    Target Date 09/13/20                 Plan - 07/26/20 1626    Clinical Impression Statement Patient sore today, hasn't taken pain medication, had to replace the dressing today and was sore after prior visit.  Patient fatigued after treatment and ready to go home and take a nap and a pain pill.  Patient  given heel  lift for Left shoe to try as tib-fib length L shorter than R.  Patient responded well to side stepping over cane to improve posture and picking foot up during side stepping task.  Patient will benefit from continued skilled PT to address deficits and maximize safe funcitonal mobility at home and in the community.    Comorbidities HTN, CAD, LE variosities, DM, OA, chronic LBP with R sciatica, DDD and carpel tunnel.    Examination-Activity Limitations Bathing;Bed Mobility;Bend;Carry;Dressing;Lift;Locomotion Level;Sleep;Squat;Stairs;Stand    Examination-Participation Restrictions Church;Cleaning;Community Activity;Meal Prep;Laundry    PT Treatment/Interventions ADLs/Self Care Home Management;Cryotherapy;Electrical Stimulation;Moist Heat;Gait training;Stair training;Functional mobility training;Therapeutic activities;Therapeutic exercise;Balance training;Neuromuscular re-education;Patient/family education;Manual techniques;Scar mobilization;Passive range of motion;Taping    PT Next Visit Plan Assess HEP affects and progress as appropriate, initiate stair training, balance, perform BERG for baseline by visit 4.    PT Home Exercise Plan [email or web address]    Consulted and Agree with Plan of Care Patient           Patient will benefit from skilled therapeutic intervention in order to improve the following deficits and impairments:  Abnormal gait,Decreased balance,Decreased mobility,Decreased skin integrity,Difficulty walking,Decreased range of motion,Decreased scar mobility,Improper body mechanics,Decreased activity tolerance,Decreased strength,Impaired flexibility,Pain  Visit Diagnosis: Chronic bilateral low back pain, unspecified whether sciatica present  Muscle weakness (generalized)  Abnormal posture  Difficulty in walking, not elsewhere classified  Stiffness of right hip, not elsewhere classified  Stiffness of left hip, not elsewhere classified     Problem List Patient Active Problem List    Diagnosis Date Noted  . PICC (peripherally inserted central catheter) in place 06/22/2020  . Therapeutic drug monitoring 06/22/2020  . Wound infection after surgery 05/25/2020  . Wound drainage 05/24/2020  . S/P lumbar fusion 04/25/2020  . Microalbuminuria due to type 2 diabetes mellitus (HCC) 08/07/2019  . Primary localized osteoarthritis of right hip 08/22/2018  . Carpal tunnel syndrome, bilateral 08/06/2018  . Type 2 diabetes mellitus with peripheral neuropathy (HCC) 08/06/2018  . Degenerative joint disease of right hip 08/06/2018  . Preoperative clearance 03/26/2018  . Upper back pain on left side 10/30/2016  . Encounter for postoperative carotid endarterectomy surveillance 12/07/2015  . Varicosities of leg 06/03/2015  . Acute left lumbar radiculopathy 04/15/2014  . Chronic sciatica of right side 03/17/2013  . Diabetic peripheral neuropathy (HCC) 03/17/2013  . Coronary artery disease   . Carotid artery stenosis s/p CEA 2013 10/16/2011  . Carotid artery occlusion without infarction, right 09/17/2011  . Carotid bruit 08/28/2011  . Gout 04/13/2011  . DM type 2 with diabetic peripheral neuropathy (HCC) 04/02/2008  . Hyperlipidemia associated with type 2 diabetes mellitus (HCC) 04/02/2008  . Essential hypertension 04/02/2008  . DEGENERATIVE DISC DISEASE, LUMBOSACRAL SPINE 04/02/2008    Myrla Halsted, PT 07/26/2020, 5:25 PM  [facility] [address] [place], [place], [address] Phone: [phone]   Fax:  [phone]  Name: SERAYAH YAZDANI MRN: [id number] Date of Birth: [birth year]

## 2020-07-26 NOTE — Patient Instructions (Signed)
Access Code: [email or web address] URL: [email or web address] Date: 07/26/2020 Prepared by: Myrla Halsted  New Exercises:  Standing Hip Extension with Counter Support - 1 x daily - 7 x weekly - 2 sets - 10 reps Seated Hamstring Stretch - 1 x daily - 7 x weekly - 2 sets - 20sec hold

## 2020-08-01 ENCOUNTER — Encounter: Payer: Self-pay | Admitting: Physical Therapy

## 2020-08-01 ENCOUNTER — Ambulatory Visit: Payer: Medicare HMO | Attending: Student | Admitting: Physical Therapy

## 2020-08-01 ENCOUNTER — Other Ambulatory Visit: Payer: Self-pay

## 2020-08-01 DIAGNOSIS — Z96641 Presence of right artificial hip joint: Secondary | ICD-10-CM | POA: Diagnosis present

## 2020-08-01 DIAGNOSIS — R293 Abnormal posture: Secondary | ICD-10-CM

## 2020-08-01 DIAGNOSIS — M545 Low back pain, unspecified: Secondary | ICD-10-CM | POA: Insufficient documentation

## 2020-08-01 DIAGNOSIS — M6281 Muscle weakness (generalized): Secondary | ICD-10-CM | POA: Insufficient documentation

## 2020-08-01 DIAGNOSIS — M25551 Pain in right hip: Secondary | ICD-10-CM | POA: Diagnosis present

## 2020-08-01 DIAGNOSIS — M25651 Stiffness of right hip, not elsewhere classified: Secondary | ICD-10-CM | POA: Insufficient documentation

## 2020-08-01 DIAGNOSIS — M25511 Pain in right shoulder: Secondary | ICD-10-CM | POA: Insufficient documentation

## 2020-08-01 DIAGNOSIS — M25652 Stiffness of left hip, not elsewhere classified: Secondary | ICD-10-CM | POA: Insufficient documentation

## 2020-08-01 DIAGNOSIS — G8929 Other chronic pain: Secondary | ICD-10-CM | POA: Insufficient documentation

## 2020-08-01 DIAGNOSIS — R262 Difficulty in walking, not elsewhere classified: Secondary | ICD-10-CM | POA: Insufficient documentation

## 2020-08-01 NOTE — Therapy (Signed)
[facility] Outpatient Rehabilitation [facility] [address] [place], [place], [address] Phone: [phone]   Fax:  [phone]  Physical Therapy Treatment  Patient Details  Name: April Shaffer MRN: [id number] Date of Birth: 02-Oct-1950 Referring Provider (PT): Verlin Dike   Encounter Date: 08/01/2020   PT End of Session - 08/01/20 1127    Visit Number 4    Number of Visits 17    Date for PT Re-Evaluation 09/13/20    Authorization Type [employer] - Medicaid    PT Start Time 1048    PT Stop Time 1127    PT Time Calculation (min) 39 min    Activity Tolerance Patient tolerated treatment well;No increased pain    Behavior During Therapy WFL for tasks assessed/performed           Past Medical History:  Diagnosis Date  . Arthritis   . Carotid artery narrowing   . Coronary artery disease    Cardiac catheterization November 2013: 50% ostial LAD stenosis 50% mid stenosis. 30% disease in the left circumflex.  . Diabetes mellitus, type 2 (HCC)   . Hyperlipidemia   . Hypertension   . Onychomycosis of toenail 07/31/2016  . Sleep apnea       Had surgery to correct    Past Surgical History:  Procedure Laterality Date  . ABDOMINAL HYSTERECTOMY    . CARDIAC CATHETERIZATION    . CATARACT EXTRACTION, BILATERAL Bilateral 2021   Dr. Joseph Art  . ENDARTERECTOMY  09/27/2011   Procedure: RIGT ENDARTERECTOMY CAROTID;  Surgeon: Pryor Ochoa, MD;  Location: [facility] OR;  Service: Vascular;  Laterality: Right;  . EPIDURAL BLOCK INJECTION  02/2008   Drs. Dorthula Nettles  . gyn surgery  2004   total hysterectomy for mennorhagia,,salpingoophorectomy  . LUMBAR WOUND DEBRIDEMENT N/A 05/25/2020   Procedure: LUMBAR WOUND IRRIGATION AND DEBRIDEMENT;  Surgeon: Tia Alert, MD;  Location: [facility] OR;  Service: Neurosurgery;  Laterality: N/A;  . POSTERIOR LUMBAR FUSION 4 LEVEL N/A 04/25/2020   Procedure: POSTERIOR LUMBAR INTERBODY FUSION LUMBAR ONE-TWO, LUMBAR TWO-THREE, LUMBAR  THREE-FOUR,LUMBAR FOUR-FIVE.;  Surgeon: Tia Alert, MD;  Location: [facility] OR;  Service: Neurosurgery;  Laterality: N/A;  posterior  . TONSILLECTOMY    . TOTAL ABDOMINAL HYSTERECTOMY W/ BILATERAL SALPINGOOPHORECTOMY Bilateral 2000  . TOTAL HIP ARTHROPLASTY Right 08/22/2018   Procedure: TOTAL HIP ARTHROPLASTY;  Surgeon: Frederico Hamman, MD;  Location: [facility] ORS;  Service: Orthopedics;  Laterality: Right;  Marland Kitchen VARICOSE VEIN SURGERY  2008   stripping    There were no vitals filed for this visit.   Subjective Assessment - 08/01/20 1053    Subjective Patient reports she had some pain this morning but it's better. Patient reports good compliance with HEP, it's helping "a little, I went and did some cooking this week. Also was able to stand to wash up instead of sitting.    Limitations Lifting;Walking;House hold activities;Standing    Currently in Pain? Yes    Pain Score 4     Pain Location Back    Pain Orientation Lower    Pain Descriptors / Indicators Aching;Sore              [facility] PT Assessment - 08/01/20 0001      Assessment   Medical Diagnosis Low Back Pain s/p fusion    Referring Provider (PT) Verlin Dike    Onset Date/Surgical Date 05/25/20    Next MD Visit August 02, 2020      Ambulation/Gait   Gait Comments Lateral  step over cane R/L, then side stepping along bar 3x12' ea dir      Timed Up and Go Test   Normal TUG (seconds) 18.5   22,18,15                        [facility] Adult PT Treatment/Exercise - 08/01/20 0001      Ambulation/Gait   Ambulation Distance (Feet) 200 Feet    Assistive device Straight cane    Gait Pattern Step-through pattern;Antalgic    Ambulation Surface Level    Gait velocity moderatley slow      Lumbar Exercises: Aerobic   Nustep L5x59min      Lumbar Exercises: Seated   Other Seated Lumbar Exercises Thor ext over chair 4x10sec      Knee/Hip Exercises: Standing   Heel Raises 10 reps;2 sets    Hip Abduction 10 reps;Both;2 sets    Hip  Extension 2 sets;10 reps    Lateral Step Up 10 reps;Left;Right    Other Standing Knee Exercises Side step with RED Tband.      Knee/Hip Exercises: Seated   Clamshell with TheraBand Red   x20                 PT Education - 08/01/20 1121    Education Details Patient encouraged to continue with HEP including balance activities in a safe place like at the kitchen counter.    Person(s) Educated Patient    Methods Explanation;Demonstration    Comprehension Verbalized understanding;Returned demonstration            PT Short Term Goals - 07/26/20 1720      PT SHORT TERM GOAL #1   Target Date 08/02/20             PT Long Term Goals - 08/01/20 1129      PT LONG TERM GOAL #2   Title Patient will improve BERG Balance by at least 5 points per significance of reduced falls risk.    Baseline 48/56    Time 8    Period Weeks    Status On-going                 Plan - 08/01/20 1128    Clinical Impression Statement Patient reports improved ability to perform standing tasks during the day including cooking and grooming.  Patient also reports the heel lift is noticably helping. Patient challenged by TUG test today, use of cane and 15 - 22 sec for 10 m. Reports no pain at end of treatment today. Patient would benefit from continued skilled PT to address deficits and maximize safe functional mobility to reduce risk of falls and reduced pain with daily activity.    Comorbidities HTN, CAD, LE variosities, DM, OA, chronic LBP with R sciatica, DDD and carpel tunnel.    Examination-Activity Limitations Bathing;Bed Mobility;Bend;Carry;Dressing;Lift;Locomotion Level;Sleep;Squat;Stairs;Stand    Examination-Participation Restrictions Church;Cleaning;Community Activity;Meal Prep;Laundry    PT Treatment/Interventions ADLs/Self Care Home Management;Cryotherapy;Electrical Stimulation;Moist Heat;Gait training;Stair training;Functional mobility training;Therapeutic activities;Therapeutic  exercise;Balance training;Neuromuscular re-education;Patient/family education;Manual techniques;Scar mobilization;Passive range of motion;Taping    PT Next Visit Plan Assess HEP affects and progress as appropriate, initiate stair training, balance.    PT Home Exercise Plan [id number]    Consulted and Agree with Plan of Care Patient           Patient will benefit from skilled therapeutic intervention in order to improve the following deficits and impairments:  Abnormal gait,Decreased balance,Decreased mobility,Decreased skin integrity,Difficulty walking,Decreased range of  motion,Decreased scar mobility,Improper body mechanics,Decreased activity tolerance,Decreased strength,Impaired flexibility,Pain  Visit Diagnosis: Chronic bilateral low back pain, unspecified whether sciatica present  History of total hip arthroplasty, right  Abnormal posture  Muscle weakness (generalized)  Difficulty in walking, not elsewhere classified  Stiffness of right hip, not elsewhere classified  Stiffness of left hip, not elsewhere classified     Problem List Patient Active Problem List   Diagnosis Date Noted  . PICC (peripherally inserted central catheter) in place 06/22/2020  . Therapeutic drug monitoring 06/22/2020  . Wound infection after surgery 05/25/2020  . Wound drainage 05/24/2020  . S/P lumbar fusion 04/25/2020  . Microalbuminuria due to type 2 diabetes mellitus (HCC) 08/07/2019  . Primary localized osteoarthritis of right hip 08/22/2018  . Carpal tunnel syndrome, bilateral 08/06/2018  . Type 2 diabetes mellitus with peripheral neuropathy (HCC) 08/06/2018  . Degenerative joint disease of right hip 08/06/2018  . Preoperative clearance 03/26/2018  . Upper back pain on left side 10/30/2016  . Encounter for postoperative carotid endarterectomy surveillance 12/07/2015  . Varicosities of leg 06/03/2015  . Acute left lumbar radiculopathy 04/15/2014  . Chronic sciatica of right side 03/17/2013   . Diabetic peripheral neuropathy (HCC) 03/17/2013  . Coronary artery disease   . Carotid artery stenosis s/p CEA 2013 10/16/2011  . Carotid artery occlusion without infarction, right 09/17/2011  . Carotid bruit 08/28/2011  . Gout 04/13/2011  . DM type 2 with diabetic peripheral neuropathy (HCC) 04/02/2008  . Hyperlipidemia associated with type 2 diabetes mellitus (HCC) 04/02/2008  . Essential hypertension 04/02/2008  . DEGENERATIVE DISC DISEASE, LUMBOSACRAL SPINE 04/02/2008    Myrla Halsted, PT 08/01/2020, 11:31 AM  [facility] [address] [place], [place], [address] Phone: [phone]   Fax:  [phone]  Name: MONZERAT HANDLER MRN: [id number] Date of Birth: May 04, 1950

## 2020-08-02 ENCOUNTER — Encounter: Payer: Self-pay | Admitting: Surgical

## 2020-08-02 ENCOUNTER — Ambulatory Visit (INDEPENDENT_AMBULATORY_CARE_PROVIDER_SITE_OTHER): Payer: Medicare HMO | Admitting: Surgical

## 2020-08-02 DIAGNOSIS — T8149XA Infection following a procedure, other surgical site, initial encounter: Secondary | ICD-10-CM | POA: Diagnosis not present

## 2020-08-02 NOTE — Progress Notes (Signed)
   Referring Provider Raymon Mutton., FNP [address] [place],  [place] [address]   CC:  Chief Complaint  Patient presents with  . Follow-up      April Shaffer is an 70 y.o. female.  HPI: Patient is a 70 year old female here for follow-up on her back wound.  In February 2022 she underwent a spinal surgery.  She subsequently underwent lumbar wound irrigation and debridement on 05/25/2020.  She initially saw Dr. Ulice Bold for consultation on 07/15/2020.  She has also seen infectious disease in regards to this with her last visit with them being on 06/22/2020, she reports she is continue to take an oral antibiotic.  She is unsure of the name.  She reports that she has been doing dressing changes and overall feels as if things are going well.  She has been going to physical therapy and reports this is getting better with each visit.  Review of Systems General: No fevers or chills  Physical Exam Vitals with BMI 07/15/2020 06/22/2020 06/01/2020  Height 5\' 2"  - -  Weight 143 lbs - -  BMI 26.15 - -  Systolic 137 101  Diastolic 82 68 43  Pulse 72 79 84    General:  No acute distress,  Alert and oriented, Non-Toxic, Normal speech and affect Back: Lumbar back incision noted with hypergranulation tissue noted at the most superior portion of the incision.  No active drainage is noted.  No surrounding erythema or cellulitic changes are noted.  The hypergranulation is approximately 1 x 1 mm.  No tenderness with palpation    Assessment/Plan  Hypergranulation tissue was chemically cauterized using silver nitrate.  Patient tolerated this fine.  Recommend continuing with dressing changes.  We will continue to monitor and treat with local wound care as patient would like to avoid surgery.  She has made some good improvement over the past few weeks.  Picture was taken and placed in the patient's chart with patient permission.   [address] April Shaffer 08/02/2020, 2:40 PM

## 2020-08-03 ENCOUNTER — Encounter: Payer: Self-pay | Admitting: Physical Therapy

## 2020-08-03 ENCOUNTER — Other Ambulatory Visit: Payer: Self-pay

## 2020-08-03 ENCOUNTER — Ambulatory Visit: Payer: Medicare HMO | Admitting: Physical Therapy

## 2020-08-03 DIAGNOSIS — M25651 Stiffness of right hip, not elsewhere classified: Secondary | ICD-10-CM

## 2020-08-03 DIAGNOSIS — M6281 Muscle weakness (generalized): Secondary | ICD-10-CM

## 2020-08-03 DIAGNOSIS — R262 Difficulty in walking, not elsewhere classified: Secondary | ICD-10-CM

## 2020-08-03 DIAGNOSIS — M25652 Stiffness of left hip, not elsewhere classified: Secondary | ICD-10-CM

## 2020-08-03 DIAGNOSIS — Z96641 Presence of right artificial hip joint: Secondary | ICD-10-CM

## 2020-08-03 DIAGNOSIS — M545 Low back pain, unspecified: Secondary | ICD-10-CM

## 2020-08-03 DIAGNOSIS — M25511 Pain in right shoulder: Secondary | ICD-10-CM

## 2020-08-03 DIAGNOSIS — R293 Abnormal posture: Secondary | ICD-10-CM

## 2020-08-03 NOTE — Therapy (Signed)
[facility] Outpatient Rehabilitation [facility] [address] [place], [place], [address] Phone: [phone]   Fax:  [phone]  Physical Therapy Treatment  Patient Details  Name: April Shaffer MRN: [id number] Date of Birth: 03-26-1950 Referring Provider (PT): Verlin Dike   Encounter Date: 08/03/2020   PT End of Session - 08/03/20 1055    Visit Number 5    Number of Visits 17    Date for PT Re-Evaluation 09/13/20    Authorization Type [employer] Medicare - Medicaid    Progress Note Due on Visit 10    PT Start Time 1055    PT Stop Time 1129    PT Time Calculation (min) 34 min    Activity Tolerance Patient tolerated treatment well;No increased pain    Behavior During Therapy WFL for tasks assessed/performed           Past Medical History:  Diagnosis Date  . Arthritis   . Carotid artery narrowing   . Coronary artery disease    Cardiac catheterization November 2013: 50% ostial LAD stenosis 50% mid stenosis. 30% disease in the left circumflex.  . Diabetes mellitus, type 2 (HCC)   . Hyperlipidemia   . Hypertension   . Onychomycosis of toenail 07/31/2016  . Sleep apnea       Had surgery to correct    Past Surgical History:  Procedure Laterality Date  . ABDOMINAL HYSTERECTOMY    . CARDIAC CATHETERIZATION    . CATARACT EXTRACTION, BILATERAL Bilateral 2021   Dr. Joseph Art  . ENDARTERECTOMY  09/27/2011   Procedure: RIGT ENDARTERECTOMY CAROTID;  Surgeon: Pryor Ochoa, MD;  Location: [facility] OR;  Service: Vascular;  Laterality: Right;  . EPIDURAL BLOCK INJECTION  02/2008   Drs. Dorthula Nettles  . gyn surgery  2004   total hysterectomy for mennorhagia,,salpingoophorectomy  . LUMBAR WOUND DEBRIDEMENT N/A 05/25/2020   Procedure: LUMBAR WOUND IRRIGATION AND DEBRIDEMENT;  Surgeon: Tia Alert, MD;  Location: [facility] OR;  Service: Neurosurgery;  Laterality: N/A;  . POSTERIOR LUMBAR FUSION 4 LEVEL N/A 04/25/2020   Procedure: POSTERIOR LUMBAR INTERBODY FUSION LUMBAR  ONE-TWO, LUMBAR TWO-THREE, LUMBAR THREE-FOUR,LUMBAR FOUR-FIVE.;  Surgeon: Tia Alert, MD;  Location: [facility] OR;  Service: Neurosurgery;  Laterality: N/A;  posterior  . TONSILLECTOMY    . TOTAL ABDOMINAL HYSTERECTOMY W/ BILATERAL SALPINGOOPHORECTOMY Bilateral 2000  . TOTAL HIP ARTHROPLASTY Right 08/22/2018   Procedure: TOTAL HIP ARTHROPLASTY;  Surgeon: Frederico Hamman, MD;  Location: [facility];  Service: Orthopedics;  Laterality: Right;  Marland Kitchen VARICOSE VEIN SURGERY  2008   stripping    There were no vitals filed for this visit.   Subjective Assessment - 08/03/20 1058    Subjective Patient reports she went to the doctor and does not have to return until August, the incision looks good and she will probably not need more surgery. She sees the Engineer, petroleum in 3 weeks.    Limitations Lifting;Walking;House hold activities;Standing    Patient Stated Goals Strength and improved walking.    Currently in Pain? No/denies              [facility] PT Assessment - 08/03/20 0001      Assessment   Medical Diagnosis Low Back Pain s/p fusion    Referring Provider (PT) Verlin Dike    Onset Date/Surgical Date 05/25/20    Next MD Visit August 02, 2020  [facility] Adult PT Treatment/Exercise - 08/03/20 0001      Transfers   Supine to Sit 6: Modified independent (Device/Increase time)    Transfer Cueing Log roll      Lumbar Exercises: Aerobic   Nustep L5x45min      Lumbar Exercises: Standing   Heel Raises 20 reps    Functional Squats 10 reps    Functional Squats Limitations tap to chair with UE support on bar      Lumbar Exercises: Supine   Bridge 10 reps    Bridge Limitations 2 sets    Straight Leg Raise 10 reps    Straight Leg Raises Limitations 2set    Other Supine Lumbar Exercises Hooklying LTR 2x10ea      Knee/Hip Exercises: Stretches   Passive Hamstring Stretch 2 reps;20 seconds      Knee/Hip Exercises: Standing   Hip Abduction 10 reps;Both;2 sets     Abduction Limitations RED band at knees    Hip Extension 2 sets;10 reps    Extension Limitations RED band at knees    Other Standing Knee Exercises Lateral then FWD/BK step over cane for improved hip flex during gait.   decresed use of UE support as able.     Manual Therapy   Manual Therapy Soft tissue mobilization    Manual therapy comments lower thor and lumbar paraspinals, c/o "soreness" today    Soft tissue mobilization lateral deltoid, pec minor , posterior scapular, rhomboid                   PT Education - 08/03/20 1128    Education Details Continue with HEP.  Practice the log roll with UE support through bed instead of the bed rail.    Person(s) Educated Patient    Methods Explanation;Demonstration    Comprehension Verbalized understanding;Returned demonstration            PT Short Term Goals - 08/03/20 1131      PT SHORT TERM GOAL #1   Title Patient will be independent with initial HEP for symptom management and improved ADLs.    Baseline issued at eval    Time 2    Period Weeks    Status Achieved      PT SHORT TERM GOAL #2   Title Patient to be instructed on proper body mechanics to reduce pain during functional tasks.    Baseline Still under spinal precautions for limited BLT.    Time 3    Period Weeks    Status On-going    Target Date 08/09/20      PT SHORT TERM GOAL #3   Title BERG balance to be administered by PT in first two weeks.    Baseline 48/56    Time 2    Period Weeks    Status Achieved             PT Long Term Goals - 08/01/20 1129      PT LONG TERM GOAL #2   Title Patient will improve BERG Balance by at least 5 points per significance of reduced falls risk.    Baseline 48/56    Time 8    Period Weeks    Status On-going                 Plan - 08/03/20 1054    Clinical Impression Statement Late for start of session.  Patient tolerated increased balance stepping over cane without UE support and increased core exercises  with back  fatigue but no increase in back pain. Patient continues to be challenged by R hip weakness compared to L.    Comorbidities HTN, CAD, LE variosities, DM, OA, chronic LBP with R sciatica, DDD and carpel tunnel.    Examination-Activity Limitations Bathing;Bed Mobility;Bend;Carry;Dressing;Lift;Locomotion Level;Sleep;Squat;Stairs;Stand    Examination-Participation Restrictions Church;Cleaning;Community Activity;Meal Prep;Laundry    PT Treatment/Interventions ADLs/Self Care Home Management;Cryotherapy;Electrical Stimulation;Moist Heat;Gait training;Stair training;Functional mobility training;Therapeutic activities;Therapeutic exercise;Balance training;Neuromuscular re-education;Patient/family education;Manual techniques;Scar mobilization;Passive range of motion;Taping    PT Next Visit Plan Assess HEP affects and progress as appropriate, initiate stair training, balance.    PT Home Exercise Plan [email or web address]    Consulted and Agree with Plan of Care Patient           Patient will benefit from skilled therapeutic intervention in order to improve the following deficits and impairments:  Abnormal gait,Decreased balance,Decreased mobility,Decreased skin integrity,Difficulty walking,Decreased range of motion,Decreased scar mobility,Improper body mechanics,Decreased activity tolerance,Decreased strength,Impaired flexibility,Pain  Visit Diagnosis: Chronic bilateral low back pain, unspecified whether sciatica present  History of total hip arthroplasty, right  Abnormal posture  Muscle weakness (generalized)  Difficulty in walking, not elsewhere classified  Stiffness of right hip, not elsewhere classified  Stiffness of left hip, not elsewhere classified  Acute pain of right shoulder     Problem List Patient Active Problem List   Diagnosis Date Noted  . PICC (peripherally inserted central catheter) in place 06/22/2020  . Therapeutic drug monitoring 06/22/2020  . Wound infection after  surgery 05/25/2020  . Wound drainage 05/24/2020  . S/P lumbar fusion 04/25/2020  . Microalbuminuria due to type 2 diabetes mellitus (HCC) 08/07/2019  . Primary localized osteoarthritis of right hip 08/22/2018  . Carpal tunnel syndrome, bilateral 08/06/2018  . Type 2 diabetes mellitus with peripheral neuropathy (HCC) 08/06/2018  . Degenerative joint disease of right hip 08/06/2018  . Preoperative clearance 03/26/2018  . Upper back pain on left side 10/30/2016  . Encounter for postoperative carotid endarterectomy surveillance 12/07/2015  . Varicosities of leg 06/03/2015  . Acute left lumbar radiculopathy 04/15/2014  . Chronic sciatica of right side 03/17/2013  . Diabetic peripheral neuropathy (HCC) 03/17/2013  . Coronary artery disease   . Carotid artery stenosis s/p CEA 2013 10/16/2011  . Carotid artery occlusion without infarction, right 09/17/2011  . Carotid bruit 08/28/2011  . Gout 04/13/2011  . DM type 2 with diabetic peripheral neuropathy (HCC) 04/02/2008  . Hyperlipidemia associated with type 2 diabetes mellitus (HCC) 04/02/2008  . Essential hypertension 04/02/2008  . DEGENERATIVE DISC DISEASE, LUMBOSACRAL SPINE 04/02/2008    Myrla Halsted, PT 08/03/2020, 11:35 AM  [facility] [address] [place], [place], [address] Phone: [phone]   Fax:  [phone]  Name: April Shaffer MRN: [id number] Date of Birth: 1951-01-04

## 2020-08-08 ENCOUNTER — Other Ambulatory Visit: Payer: Self-pay

## 2020-08-08 ENCOUNTER — Ambulatory Visit (INDEPENDENT_AMBULATORY_CARE_PROVIDER_SITE_OTHER): Payer: Medicare HMO | Admitting: Podiatry

## 2020-08-08 DIAGNOSIS — M79675 Pain in left toe(s): Secondary | ICD-10-CM

## 2020-08-08 DIAGNOSIS — M79674 Pain in right toe(s): Secondary | ICD-10-CM

## 2020-08-08 DIAGNOSIS — B351 Tinea unguium: Secondary | ICD-10-CM

## 2020-08-08 DIAGNOSIS — E1142 Type 2 diabetes mellitus with diabetic polyneuropathy: Secondary | ICD-10-CM | POA: Diagnosis not present

## 2020-08-08 DIAGNOSIS — E785 Hyperlipidemia, unspecified: Secondary | ICD-10-CM | POA: Insufficient documentation

## 2020-08-08 DIAGNOSIS — J0100 Acute maxillary sinusitis, unspecified: Secondary | ICD-10-CM | POA: Insufficient documentation

## 2020-08-08 NOTE — Progress Notes (Signed)
This patient returns to my office for at risk foot care.  This patient requires this care by a professional since this patient will be at risk due to having type 2 diabetes with neuropathy.  This patient is unable to cut nails herself since the patient cannot reach her nails.These nails are painful walking and wearing shoes.  This patient presents for at risk foot care today.  General Appearance  Alert, conversant and in no acute stress.  Vascular  Dorsalis pedis and posterior tibial  pulses are palpable  bilaterally.  Capillary return is within normal limits  bilaterally. Temperature is within normal limits  bilaterally.  Neurologic  Senn-Weinstein monofilament wire test decreased.  bilaterally. Muscle power within normal limits bilaterally.  Nails Thick disfigured discolored hallux nails with marked incurvation hallux nails  B/L.  Marland Kitchen No evidence of bacterial infection or drainage bilaterally.  Orthopedic  No limitations of motion  feet .  No crepitus or effusions noted.  No bony pathology or digital deformities noted.  Skin  normotropic skin with no porokeratosis noted bilaterally.  No signs of infections or ulcers noted.     Onychomycosis  Pain in right toes  Pain in left toes  Consent was obtained for treatment procedures.   Mechanical debridement of hallux nails   bilaterally performed with a nail nipper.  Filed with dremel without incident.    Return office visit     3 months                 Told patient to return for periodic foot care and evaluation due to potential at risk complications.   Helane Gunther DPM

## 2020-08-09 ENCOUNTER — Telehealth: Payer: Self-pay

## 2020-08-09 ENCOUNTER — Ambulatory Visit: Payer: Medicare HMO | Admitting: Physical Therapy

## 2020-08-09 NOTE — Telephone Encounter (Signed)
Patient called to say that she had an appointment with [place] last week and we wanted her to use the gauze and other supplies for three more weeks.  Patient said that she only has enough for one more week.  She would like to know if we can order her some more supplies.  Please call.

## 2020-08-10 NOTE — Telephone Encounter (Signed)
Called and spoke with on (08/09/20) regarding the message below.  Patient stated that she has ran out of the large bandages she's to use on the wound on her back.  She said she called the [facility] store for refills on the large bandages and she was informed that her insurance will not cover for her to get anymore until (19th) of this month.  So she just needs enough until then.  Informed the patient that we spoke with [facility], and he would like for her to use vaseline and gauze on the area until she can get the supplies.  Patient asked how is she to use the vaseline and gauze.  Informed the patient to place the vaseline and gauze on the wound on her back.   Then I asked the patient to give [facility] a call on the (19th) and let them know she needs a refill on the large bandage (Mepilex Border).  Patient verbalized understanding.//AB/CMA

## 2020-08-11 ENCOUNTER — Ambulatory Visit: Payer: Medicare HMO | Admitting: Physical Therapy

## 2020-08-16 ENCOUNTER — Ambulatory Visit
Admission: RE | Admit: 2020-08-16 | Discharge: 2020-08-16 | Disposition: A | Discharge status: Home / Self Care | Payer: Medicare HMO | Source: Ambulatory Visit | Attending: Family | Admitting: Family

## 2020-08-16 ENCOUNTER — Other Ambulatory Visit: Payer: Self-pay

## 2020-08-16 DIAGNOSIS — Z1231 Encounter for screening mammogram for malignant neoplasm of breast: Secondary | ICD-10-CM

## 2020-08-17 ENCOUNTER — Ambulatory Visit: Payer: Medicare HMO | Admitting: Physical Therapy

## 2020-08-17 ENCOUNTER — Encounter: Payer: Self-pay | Admitting: Physical Therapy

## 2020-08-17 DIAGNOSIS — M25651 Stiffness of right hip, not elsewhere classified: Secondary | ICD-10-CM

## 2020-08-17 DIAGNOSIS — G8929 Other chronic pain: Secondary | ICD-10-CM

## 2020-08-17 DIAGNOSIS — M6281 Muscle weakness (generalized): Secondary | ICD-10-CM

## 2020-08-17 DIAGNOSIS — M25652 Stiffness of left hip, not elsewhere classified: Secondary | ICD-10-CM

## 2020-08-17 DIAGNOSIS — R293 Abnormal posture: Secondary | ICD-10-CM

## 2020-08-17 DIAGNOSIS — M25551 Pain in right hip: Secondary | ICD-10-CM

## 2020-08-17 DIAGNOSIS — Z96641 Presence of right artificial hip joint: Secondary | ICD-10-CM

## 2020-08-17 DIAGNOSIS — R262 Difficulty in walking, not elsewhere classified: Secondary | ICD-10-CM

## 2020-08-17 DIAGNOSIS — M545 Low back pain, unspecified: Secondary | ICD-10-CM | POA: Diagnosis not present

## 2020-08-17 NOTE — Therapy (Signed)
[facility] [address] [place], [place], [address] Phone: [phone]   Fax:  [phone]  Physical Therapy Treatment  Patient Details  Name: April Shaffer MRN: [id number] Date of Birth: December 07, 1950 Referring Provider (PT): Verlin Dike   Encounter Date: 08/17/2020   PT End of Session - 08/17/20 1121     Visit Number 6    Number of Visits 17    Date for PT Re-Evaluation 09/13/20    Authorization Type [employer] Medicare - Medicaid    Progress Note Due on Visit 10    PT Start Time 1055    PT Stop Time 1125    PT Time Calculation (min) 30 min    Activity Tolerance Patient tolerated treatment well;No increased pain    Behavior During Therapy [facility] for tasks assessed/performed             Past Medical History:  Diagnosis Date   Arthritis    Carotid artery narrowing    Coronary artery disease    Cardiac catheterization November 2013: 50% ostial LAD stenosis 50% mid stenosis. 30% disease in the left circumflex.   Diabetes mellitus, type 2 (HCC)    Hyperlipidemia    Hypertension    Onychomycosis of toenail 07/31/2016   Sleep apnea       Had surgery to correct    Past Surgical History:  Procedure Laterality Date   ABDOMINAL HYSTERECTOMY     CARDIAC CATHETERIZATION     CATARACT EXTRACTION, BILATERAL Bilateral 2021   Dr. Joseph Art   ENDARTERECTOMY  09/27/2011   Procedure: RIGT ENDARTERECTOMY CAROTID;  Surgeon: Pryor Ochoa, MD;  Location: [facility] OR;  Service: Vascular;  Laterality: Right;   EPIDURAL BLOCK INJECTION  02/2008   Drs. Dorthula Nettles   gyn surgery  2004   total hysterectomy for mennorhagia,,salpingoophorectomy   LUMBAR WOUND DEBRIDEMENT N/A 05/25/2020   Procedure: LUMBAR WOUND IRRIGATION AND DEBRIDEMENT;  Surgeon: Tia Alert, MD;  Location: [facility] OR;  Service: Neurosurgery;  Laterality: N/A;   POSTERIOR LUMBAR FUSION 4 LEVEL N/A 04/25/2020   Procedure: POSTERIOR LUMBAR INTERBODY FUSION LUMBAR ONE-TWO,  LUMBAR TWO-THREE, LUMBAR THREE-FOUR,LUMBAR FOUR-FIVE.;  Surgeon: Tia Alert, MD;  Location: [facility] OR;  Service: Neurosurgery;  Laterality: N/A;  posterior   TONSILLECTOMY     TOTAL ABDOMINAL HYSTERECTOMY W/ BILATERAL SALPINGOOPHORECTOMY Bilateral 2000   TOTAL HIP ARTHROPLASTY Right 08/22/2018   Procedure: TOTAL HIP ARTHROPLASTY;  Surgeon: Frederico Hamman, MD;  Location: [facility] ORS;  Service: Orthopedics;  Laterality: Right;   VARICOSE VEIN SURGERY  2008   stripping    There were no vitals filed for this visit.   Subjective Assessment - 08/17/20 1101     Subjective Patient reports the hip pain that caused her to cancel both appointmens last week is better, but not a lot better.    Limitations Lifting;Walking;House hold activities;Standing    Currently in Pain? Yes    Pain Score 0-No pain    Multiple Pain Sites Yes    Pain Score 4    Pain Location Hip    Pain Orientation Left    Pain Descriptors / Indicators Sharp    Pain Type Acute pain    Pain Onset 1 to 4 weeks ago                [facility] PT Assessment - 08/17/20 0001       Assessment   Medical Diagnosis Low Back Pain s/p fusion    Referring Provider (PT) Verlin Dike  Onset Date/Surgical Date 05/25/20      Observation/Other Assessments   Focus on Therapeutic Outcomes (FOTO)  59% (65% predicted)                           [facility] Adult PT Treatment/Exercise - 08/17/20 0001       Transfers   Transfer Cueing Log roll with cues      Lumbar Exercises: Aerobic   Nustep L4x40min      Lumbar Exercises: Seated   Other Seated Lumbar Exercises Ball roll out FWD/R/L x10ea      Lumbar Exercises: Supine   Clam 15 reps    Clam Limitations YELLOW    Bridge 10 reps    Bridge Limitations 2 sets    Other Supine Lumbar Exercises Hooklying LTR 2x10ea    Other Supine Lumbar Exercises Hooklying ball squeeze x20                    PT Education - 08/17/20 1116     Education Details Patient encouraged  to continue HEP as tolerated, continue log roll for sit <> supine.    Person(s) Educated Patient    Methods Explanation;Demonstration    Comprehension Verbalized understanding;Returned demonstration              PT Short Term Goals - 08/03/20 1131       PT SHORT TERM GOAL #1   Title Patient will be independent with initial HEP for symptom management and improved ADLs.    Baseline issued at eval    Time 2    Period Weeks    Status Achieved      PT SHORT TERM GOAL #2   Title Patient to be instructed on proper body mechanics to reduce pain during functional tasks.    Baseline Still under spinal precautions for limited BLT.    Time 3    Period Weeks    Status On-going    Target Date 08/09/20      PT SHORT TERM GOAL #3   Title BERG balance to be administered by PT in first two weeks.    Baseline 48/56    Time 2    Period Weeks    Status Achieved               PT Long Term Goals - 08/01/20 1129       PT LONG TERM GOAL #2   Title Patient will improve BERG Balance by at least 5 points per significance of reduced falls risk.    Baseline 48/56    Time 8    Period Weeks    Status On-going                   Plan - 08/17/20 1110     Clinical Impression Statement Patient late for start of session.  Patient with increased L hip/pelvic pain while weight bearing, Focus of today's exercise in supine and sitting. Patient reports no increase in pain after treatment. NuStep performed at end of session and patient reports improved hip pain after 5 min.  FOTO improved from 47$ to 59%. Patient will benefit from continued skilled PT to address deficits and maximize safe function mobility with decreased pain.    Comorbidities HTN, CAD, LE variosities, DM, OA, chronic LBP with R sciatica, DDD and carpel tunnel.    Examination-Activity Limitations Bathing;Bed Mobility;Bend;Carry;Dressing;Lift;Locomotion Level;Sleep;Squat;Stairs;Stand    Examination-Participation Restrictions  Church;Cleaning;Community Activity;Meal Prep;Laundry  PT Treatment/Interventions ADLs/Self Care Home Management;Cryotherapy;Electrical Stimulation;Moist Heat;Gait training;Stair training;Functional mobility training;Therapeutic activities;Therapeutic exercise;Balance training;Neuromuscular re-education;Patient/family education;Manual techniques;Scar mobilization;Passive range of motion;Taping    PT Next Visit Plan Assess HEP affects and progress as appropriate, return to standing exercises for improved endurance, initiate stair training, balance.    PT Home Exercise Plan [email or web address] (new code as old one didn't work)    Financial planner with Plan of Care Patient             Patient will benefit from skilled therapeutic intervention in order to improve the following deficits and impairments:  Abnormal gait, Decreased balance, Decreased mobility, Decreased skin integrity, Difficulty walking, Decreased range of motion, Decreased scar mobility, Improper body mechanics, Decreased activity tolerance, Decreased strength, Impaired flexibility, Pain  Visit Diagnosis: Chronic bilateral low back pain, unspecified whether sciatica present  History of total hip arthroplasty, right  Abnormal posture  Muscle weakness (generalized)  Difficulty in walking, not elsewhere classified  Stiffness of left hip, not elsewhere classified  Pain in right hip  Stiffness of right hip, not elsewhere classified     Problem List Patient Active Problem List   Diagnosis Date Noted   Acute maxillary sinusitis 08/08/2020   Hyperlipidemia 08/08/2020   Pain due to onychomycosis of toenails of both feet 08/08/2020   PICC (peripherally inserted central catheter) in place 06/22/2020   Therapeutic drug monitoring 06/22/2020   Wound infection after surgery 05/25/2020   Wound drainage 05/24/2020   Local infection of the skin and subcutaneous tissue, unspecified 05/24/2020   Body mass index (BMI) 27.0-27.9, adult  05/10/2020   S/P lumbar fusion 04/25/2020   Microalbuminuria due to type 2 diabetes mellitus (HCC) 08/07/2019   Primary localized osteoarthritis of right hip 08/22/2018   Carpal tunnel syndrome, bilateral 08/06/2018   Type 2 diabetes mellitus with peripheral neuropathy (HCC) 08/06/2018   Degenerative joint disease of right hip 08/06/2018   Preoperative clearance 03/26/2018   Upper back pain on left side 10/30/2016   Encounter for postoperative carotid endarterectomy surveillance 12/07/2015   Varicosities of leg 06/03/2015   Acute left lumbar radiculopathy 04/15/2014   Chronic sciatica of right side 03/17/2013   Diabetic peripheral neuropathy (HCC) 03/17/2013   Coronary artery disease    Carotid artery stenosis s/p CEA 2013 10/16/2011   Carotid artery occlusion without infarction, right 09/17/2011   Carotid bruit 08/28/2011   Gout 04/13/2011   DM type 2 with diabetic peripheral neuropathy (HCC) 04/02/2008   Hyperlipidemia associated with type 2 diabetes mellitus (HCC) 04/02/2008   Essential hypertension 04/02/2008   DEGENERATIVE DISC DISEASE, LUMBOSACRAL SPINE 04/02/2008    Myrla Halsted 08/17/2020, 12:18 PM  [facility] [address] [place], [place], [address] Phone: [phone]   Fax:  [phone]  Name: April Shaffer MRN: [id number] Date of Birth: 1950/10/25

## 2020-08-17 NOTE — Patient Instructions (Signed)
Access Code: [email or web address] URL: [email or web address] Date: 08/17/2020 Prepared by: Myrla Halsted  Exercises Supine Bridge - 1 x daily - 7 x weekly - 2 sets - 10 reps Standing Hip Abduction with Counter Support - 1 x daily - 7 x weekly - 2 sets - 10 reps Standing March with Counter Support - 1 x daily - 7 x weekly - 2 sets - 10 reps Seated Hip Abduction - 1 x daily - 7 x weekly - 2 sets - 10 reps

## 2020-08-19 ENCOUNTER — Ambulatory Visit: Payer: Medicare HMO | Admitting: Physical Therapy

## 2020-08-19 ENCOUNTER — Other Ambulatory Visit: Payer: Self-pay

## 2020-08-19 DIAGNOSIS — R293 Abnormal posture: Secondary | ICD-10-CM

## 2020-08-19 DIAGNOSIS — Z96641 Presence of right artificial hip joint: Secondary | ICD-10-CM

## 2020-08-19 DIAGNOSIS — R262 Difficulty in walking, not elsewhere classified: Secondary | ICD-10-CM

## 2020-08-19 DIAGNOSIS — M545 Low back pain, unspecified: Secondary | ICD-10-CM | POA: Diagnosis not present

## 2020-08-19 DIAGNOSIS — G8929 Other chronic pain: Secondary | ICD-10-CM

## 2020-08-19 DIAGNOSIS — M6281 Muscle weakness (generalized): Secondary | ICD-10-CM

## 2020-08-19 DIAGNOSIS — M25652 Stiffness of left hip, not elsewhere classified: Secondary | ICD-10-CM

## 2020-08-19 NOTE — Therapy (Addendum)
[place], [place], [address] Phone: [phone]   Fax:  [phone]  Physical Therapy Treatment / Discharge  Patient Details  Name: SOMYA JAUREGUI MRN: [id number] Date of Birth: 12/23/1950 Referring Provider (PT): Glenford Peers   Encounter Date: 08/19/2020   PT End of Session - 08/19/20 1211     Visit Number 7    Number of Visits 17    Date for PT Re-Evaluation 09/13/20    Authorization Type [employer] Medicare - Medicaid    PT Start Time 7412    PT Stop Time 8786    PT Time Calculation (min) 54 min    Activity Tolerance Patient tolerated treatment well;Patient limited by pain    Behavior During Therapy [facility] for tasks assessed/performed             Past Medical History:  Diagnosis Date   Arthritis    Carotid artery narrowing    Coronary artery disease    Cardiac catheterization November 2013: 50% ostial LAD stenosis 50% mid stenosis. 30% disease in the left circumflex.   Diabetes mellitus, type 2 ([place])    Hyperlipidemia    Hypertension    Onychomycosis of toenail 07/31/2016   Sleep apnea       Had surgery to correct    Past Surgical History:  Procedure Laterality Date   ABDOMINAL HYSTERECTOMY     CARDIAC CATHETERIZATION     CATARACT EXTRACTION, BILATERAL Bilateral 2021   Dr. Sherral Hammers   ENDARTERECTOMY  09/27/2011   Procedure: RIGT ENDARTERECTOMY CAROTID;  Surgeon: Mal Misty, MD;  Location: [place];  Service: Vascular;  Laterality: Right;   EPIDURAL BLOCK INJECTION  02/2008   Drs. Eulis Manly   gyn surgery  2004   total hysterectomy for mennorhagia,,salpingoophorectomy   LUMBAR WOUND DEBRIDEMENT N/A 05/25/2020   Procedure: LUMBAR WOUND IRRIGATION AND DEBRIDEMENT;  Surgeon: Eustace Moore, MD;  Location: [place];  Service: Neurosurgery;  Laterality: N/A;   POSTERIOR LUMBAR FUSION 4 LEVEL N/A 04/25/2020   Procedure: POSTERIOR LUMBAR INTERBODY FUSION LUMBAR ONE-TWO, LUMBAR TWO-THREE,  LUMBAR THREE-FOUR,LUMBAR FOUR-FIVE.;  Surgeon: Eustace Moore, MD;  Location: [place];  Service: Neurosurgery;  Laterality: N/A;  posterior   TONSILLECTOMY     TOTAL ABDOMINAL HYSTERECTOMY W/ BILATERAL SALPINGOOPHORECTOMY Bilateral 2000   TOTAL HIP ARTHROPLASTY Right 08/22/2018   Procedure: TOTAL HIP ARTHROPLASTY;  Surgeon: Earlie Server, MD;  Location: [facility] ORS;  Service: Orthopedics;  Laterality: Right;   VARICOSE VEIN SURGERY  2008   stripping    There were no vitals filed for this visit.   Subjective Assessment - 08/19/20 1112     Subjective Patient reports L hip /side is getting better.  7/10 today.  I cant do the bridge anymore because my pain is bad. Sees plastic surgeon in Aug.  May call Neurologist if still bad next week.  Something aint right back there.    Currently in Pain? Yes    Pain Score 7                  [facility] Adult PT Treatment/Exercise - 08/19/20 0001       Lumbar Exercises: Stretches   Lower Trunk Rotation 10 seconds    Lower Trunk Rotation Limitations 10      Lumbar Exercises: Aerobic   Nustep 5 min L5      Lumbar Exercises: Supine   Clam 15 reps    Clam Limitations red, alternating    Bridge Limitations  unable due to pain      Knee/Hip Exercises: Sidelying   Hip ABduction Left;1 set;15 reps    Clams 2 x 10 both side      Modalities   Modalities Moist Heat      Moist Heat Therapy   Number Minutes Moist Heat 10 Minutes    Moist Heat Location Hip      Manual Therapy   Manual Therapy Passive ROM    Soft tissue mobilization Rt lateral hip, piriformis, low lumbar region in sidelying , lateral SI border    Passive ROM L hip . LAD, piriformis and IR strethcing in supine                      PT Short Term Goals - 08/03/20 1131       PT SHORT TERM GOAL #1   Title Patient will be independent with initial HEP for symptom management and improved ADLs.    Baseline issued at eval    Time 2    Period Weeks    Status Achieved      PT  SHORT TERM GOAL #2   Title Patient to be instructed on proper body mechanics to reduce pain during functional tasks.    Baseline Still under spinal precautions for limited BLT.    Time 3    Period Weeks    Status On-going    Target Date 08/09/20      PT SHORT TERM GOAL #3   Title BERG balance to be administered by PT in first two weeks.    Baseline 48/56    Time 2    Period Weeks    Status Achieved               PT Long Term Goals - 08/01/20 1129       PT LONG TERM GOAL #2   Title Patient will improve BERG Balance by at least 5 points per significance of reduced falls risk.    Baseline 48/56    Time 8    Period Weeks    Status On-going                   Plan - 08/19/20 1116     Clinical Impression Statement Patient needed A to walk to treatment room following simple mat level exercises.  PErfomred soft tissue work to L lateral hip and lumbar region with min relief as a result.  Pain seems more likely from lumbar vs hip OA as Scout test neg and LAD relieved her pain as well. Cont POC as tolerated.    PT Treatment/Interventions ADLs/Self Care Home Management;Cryotherapy;Electrical Stimulation;Moist Heat;Gait training;Stair training;Functional mobility training;Therapeutic activities;Therapeutic exercise;Balance training;Neuromuscular re-education;Patient/family education;Manual techniques;Scar mobilization;Passive range of motion;Taping    PT Next Visit Plan AAnt hip stretch.  Manual repeat? STRENGTtH overall in LE.  return to standing exercises for improved endurance, initiate stair training, balance.    PT Home Exercise Plan [email or web address] (new code as old one didn't work)    Oncologist with Plan of Care Patient             Patient will benefit from skilled therapeutic intervention in order to improve the following deficits and impairments:  Abnormal gait, Decreased balance, Decreased mobility, Decreased skin integrity, Difficulty walking, Decreased range  of motion, Decreased scar mobility, Improper body mechanics, Decreased activity tolerance, Decreased strength, Impaired flexibility, Pain  Visit Diagnosis: Chronic bilateral low back pain, unspecified whether sciatica present  History of  total hip arthroplasty, right  Abnormal posture  Muscle weakness (generalized)  Difficulty in walking, not elsewhere classified  Stiffness of left hip, not elsewhere classified     Problem List Patient Active Problem List   Diagnosis Date Noted   Acute maxillary sinusitis 08/08/2020   Hyperlipidemia 08/08/2020   Pain due to onychomycosis of toenails of both feet 08/08/2020   PICC (peripherally inserted central catheter) in place 06/22/2020   Therapeutic drug monitoring 06/22/2020   Wound infection after surgery 05/25/2020   Wound drainage 05/24/2020   Local infection of the skin and subcutaneous tissue, unspecified 05/24/2020   Body mass index (BMI) 27.0-27.9, adult 05/10/2020   S/P lumbar fusion 04/25/2020   Microalbuminuria due to type 2 diabetes mellitus ([place]) 08/07/2019   Primary localized osteoarthritis of right hip 08/22/2018   Carpal tunnel syndrome, bilateral 08/06/2018   Type 2 diabetes mellitus with peripheral neuropathy ([place]) 08/06/2018   Degenerative joint disease of right hip 08/06/2018   Preoperative clearance 03/26/2018   Upper back pain on left side 10/30/2016   Encounter for postoperative carotid endarterectomy surveillance 12/07/2015   Varicosities of leg 06/03/2015   Acute left lumbar radiculopathy 04/15/2014   Chronic sciatica of right side 03/17/2013   Diabetic peripheral neuropathy ([place]) 03/17/2013   Coronary artery disease    Carotid artery stenosis s/p CEA 2013 10/16/2011   Carotid artery occlusion without infarction, right 09/17/2011   Carotid bruit 08/28/2011   Gout 04/13/2011   DM type 2 with diabetic peripheral neuropathy ([place]) 04/02/2008   Hyperlipidemia associated with type 2 diabetes mellitus ([place])  04/02/2008   Essential hypertension 04/02/2008   DEGENERATIVE [place] DISEASE, LUMBOSACRAL SPINE 04/02/2008    Duglas Heier 08/19/2020, 12:13 PM  [facility] [address] [place], [place], [address] Phone: [phone]   Fax:  [phone]  Name: MYLEEN BRAILSFORD MRN: [id number] Date of Birth: 1950/07/14  Raeford Razor, PT 08/19/20 12:13 PM Phone: [phone] Fax: [phone]       PHYSICAL THERAPY DISCHARGE SUMMARY  Visits from Start of Care: 7  Current functional level related to goals / functional outcomes: See goals   Remaining deficits: Current status unknown   Education / Equipment: HEP   Patient agrees to discharge. Patient goals were not met. Patient is being discharged due to not returning since the last visit. Kristoffer Leamon PT, DPT, LAT, ATC  09/29/20  12:36 PM

## 2020-08-26 ENCOUNTER — Ambulatory Visit: Payer: Medicare HMO | Admitting: Surgical

## 2020-08-30 ENCOUNTER — Other Ambulatory Visit: Payer: Self-pay

## 2020-08-30 ENCOUNTER — Ambulatory Visit (INDEPENDENT_AMBULATORY_CARE_PROVIDER_SITE_OTHER): Payer: Medicare HMO | Admitting: Surgical

## 2020-08-30 DIAGNOSIS — T8149XA Infection following a procedure, other surgical site, initial encounter: Secondary | ICD-10-CM

## 2020-08-30 NOTE — Progress Notes (Signed)
   Referring Provider Raymon Mutton., FNP [address] [place],  [place] [address]   CC:  Chief Complaint  Patient presents with   Follow-up      April Shaffer is an 70 y.o. female.  HPI: Patient is a 70 year old female here for follow-up on her back wound.  In February 2022 she underwent spinal surgery and subsequently underwent lumbar wound irrigation and debridement on 05/25/2020.  Patient reports she is doing well.  She reports she no longer needs to place a bandage on the area as she has not noticed any drainage since Friday, 08/26/2020.  She reports she is overall doing well.  She is very happy that this has healed.  Review of Systems General: No fevers or chills  Physical Exam Vitals with BMI 07/15/2020 06/22/2020 06/01/2020  Height 5\' 2"  - -  Weight 143 lbs - -  BMI 26.15 - -  Systolic 137 101  Diastolic 82 68 43  Pulse 72 79 84    General:  No acute distress,  Alert and oriented, Non-Toxic, Normal speech and affect Midline lumbar back incision is intact and well-healed.  No wounds are noted.  No surrounding erythema.  There is subcutaneous fluid collection noted with palpation.  No tenderness with palpation.     Assessment/Plan  Patient with a lumbar back wound that she developed after spinal surgery.  This has completely healed.  There is no open wound at this time.  No further wound care treatment necessary from our team.  Recommend following up on an as-needed basis.  Recommend calling with questions or concerns.  Picture was taken and placed in the patient's chart with patient's permission.  A total of 10 minutes was spent during today's encounter educating the patient, discussing the plan with the patient, reviewing patient's medical records and documenting today's encounter.  938 April Shaffer 08/30/2020, 11:19 AM

## 2020-09-09 ENCOUNTER — Other Ambulatory Visit: Payer: Self-pay | Admitting: Student

## 2020-09-09 DIAGNOSIS — S32009K Unspecified fracture of unspecified lumbar vertebra, subsequent encounter for fracture with nonunion: Secondary | ICD-10-CM

## 2020-09-26 ENCOUNTER — Other Ambulatory Visit: Payer: Self-pay | Admitting: Cardiovascular Disease

## 2020-09-27 ENCOUNTER — Ambulatory Visit
Admission: RE | Admit: 2020-09-27 | Discharge: 2020-09-27 | Disposition: A | Discharge status: Home / Self Care | Payer: Medicare HMO | Source: Ambulatory Visit | Attending: Student | Admitting: Student

## 2020-09-27 DIAGNOSIS — S32009K Unspecified fracture of unspecified lumbar vertebra, subsequent encounter for fracture with nonunion: Secondary | ICD-10-CM

## 2020-10-07 ENCOUNTER — Other Ambulatory Visit: Payer: Self-pay | Admitting: Neurological Surgery

## 2020-10-07 DIAGNOSIS — S32009K Unspecified fracture of unspecified lumbar vertebra, subsequent encounter for fracture with nonunion: Secondary | ICD-10-CM

## 2020-10-23 ENCOUNTER — Ambulatory Visit
Admission: RE | Admit: 2020-10-23 | Discharge: 2020-10-23 | Disposition: A | Discharge status: Home / Self Care | Payer: Medicare HMO | Source: Ambulatory Visit | Attending: Neurological Surgery | Admitting: Neurological Surgery

## 2020-10-23 ENCOUNTER — Other Ambulatory Visit: Payer: Self-pay

## 2020-10-23 DIAGNOSIS — S32009K Unspecified fracture of unspecified lumbar vertebra, subsequent encounter for fracture with nonunion: Secondary | ICD-10-CM

## 2020-10-23 MED ORDER — GADOBENATE DIMEGLUMINE 529 MG/ML IV SOLN
12.0000 mL | Freq: Once | INTRAVENOUS | Status: AC | PRN
  Administered 2020-10-23: 12 mL via INTRAVENOUS

## 2020-10-28 ENCOUNTER — Other Ambulatory Visit: Payer: Self-pay | Admitting: Neurological Surgery

## 2020-11-09 ENCOUNTER — Telehealth: Payer: Self-pay

## 2020-11-09 ENCOUNTER — Other Ambulatory Visit: Payer: Self-pay

## 2020-11-09 ENCOUNTER — Encounter: Payer: Self-pay | Admitting: Podiatry

## 2020-11-09 ENCOUNTER — Ambulatory Visit (INDEPENDENT_AMBULATORY_CARE_PROVIDER_SITE_OTHER): Payer: Medicare HMO | Admitting: Podiatry

## 2020-11-09 DIAGNOSIS — E1142 Type 2 diabetes mellitus with diabetic polyneuropathy: Secondary | ICD-10-CM

## 2020-11-09 DIAGNOSIS — M2042 Other hammer toe(s) (acquired), left foot: Secondary | ICD-10-CM | POA: Diagnosis not present

## 2020-11-09 DIAGNOSIS — B351 Tinea unguium: Secondary | ICD-10-CM | POA: Diagnosis not present

## 2020-11-09 DIAGNOSIS — M79674 Pain in right toe(s): Secondary | ICD-10-CM

## 2020-11-09 DIAGNOSIS — M79675 Pain in left toe(s): Secondary | ICD-10-CM | POA: Diagnosis not present

## 2020-11-09 DIAGNOSIS — M2041 Other hammer toe(s) (acquired), right foot: Secondary | ICD-10-CM | POA: Diagnosis not present

## 2020-11-09 NOTE — Telephone Encounter (Signed)
Patient is returning a phone call to preop

## 2020-11-09 NOTE — Telephone Encounter (Signed)
   [facility] Pre-operative Risk Assessment    Patient Name: April Shaffer  DOB: 12-02-1950 MRN: [id number]  HEARTCARE STAFF:  - IMPORTANT!!!!!! Under Visit Info/Reason for Call, type in Other and utilize the format Clearance MM/DD/YY or Clearance TBD. Do not use dashes or single digits. - Please review there is not already an duplicate clearance open for this procedure. - If request is for dental extraction, please clarify the # of teeth to be extracted. - If the patient is currently at the dentist's office, call Pre-Op Callback Staff (MA/nurse) to input urgent request.  - If the patient is not currently in the dentist office, please route to the Pre-Op pool.  Request for surgical clearance:  What type of surgery is being performed? L4-5, L5-S1 Posterior Lateral Fusion w/ revision of hardware  When is this surgery scheduled? 11/30/20  What type of clearance is required (medical clearance vs. Pharmacy clearance to hold med vs. Both)? Both   Are there any medications that need to be held prior to surgery and how long? Plavix  Practice name and name of physician performing surgery? [facility], Dr. Sherley Bounds  What is the office phone number? [phone]   7.   What is the office fax number? [phone]  8.   Anesthesia type (None, local, MAC, general) ? General    Jacqulynn Cadet 11/09/2020, 11:08 AM  _________________________________________________________________   (provider comments below)

## 2020-11-09 NOTE — Telephone Encounter (Signed)
Left VM  She has noncritical CAD with nonischemic nuclear stress test prior to hip surgery in 2020.  Pt is on plavix for carotic artery disease, managed by [facility].

## 2020-11-10 NOTE — Telephone Encounter (Signed)
   Name: April Shaffer  DOB: 08-20-1950  MRN: [id number]   Primary Cardiologist: Nanetta Batty, MD  Chart reviewed as part of pre-operative protocol coverage. Patient was contacted 11/10/2020 in reference to pre-operative risk assessment for pending surgery as outlined below.  April Shaffer was last seen on 12/08/2019 by Dr. Allyson Sabal.  Since that day, April Shaffer has done well from a cardiac standpoint.  Today, 11/10/2020, she reports that she has not had any chest pain or shortness of breath.  She is limited in her activity by her back pain but is able to ambulate with her walker without any exertional chest pain or shortness of breath.  We reviewed the previous recommendations to hold Plavix 5-7 days before her procedure and aspirin 3-5 days pre op.  Resume as soon as safe post op, ideally within 24-48 hours.  Therefore, based on ACC/AHA guidelines, the patient would be at acceptable risk for the planned procedure without further cardiovascular testing.   The patient was advised that if she develops new symptoms prior to surgery to contact our office to arrange for a follow-up visit, and she verbalized understanding.  I will route this recommendation to the requesting party via Epic fax function and remove from pre-op pool. Please call with questions.  Lennon Alstrom, PA-C 11/10/2020, 9:40 AM

## 2020-11-25 NOTE — Progress Notes (Signed)
Surgical Instructions    Your procedure is scheduled on Wednesday, October 5th, 2022.   Report to [facility] Main Entrance "A" at 06:30 A.M., then check in with the Admitting office.  Call this number if you have problems the morning of surgery:  [phone]   If you have any questions prior to your surgery date call [phone]: Open Monday-Friday 8am-4pm    Remember:  Do not eat or drink after midnight the night before your surgery   Take these medicines the morning of surgery with A SIP OF WATER:  allopurinol (ZYLOPRIM) amLODipine (NORVASC) atorvastatin (LIPITOR) carvedilol (COREG CR)  ezetimibe (ZETIA)  gabapentin (NEURONTIN) methocarbamol (ROBAXIN)  If needed:  MITIGARE oxyCODONE-acetaminophen (PERCOCET/ROXICET)   Follow your surgeon's instructions on when to stop Aspirin and Plavix.  If no instructions were given by your surgeon then you will need to call the office to get those instructions.     As of today, STOP taking any Aspirin (unless otherwise instructed by your surgeon) Aleve, Naproxen, Ibuprofen, Motrin, Advil, Goody's, BC's, all herbal medications, fish oil, and all vitamins.   WHAT DO I DO ABOUT MY DIABETES MEDICATION?   Do not take metFORMIN (GLUCOPHAGE)  the morning of surgery.   HOW TO MANAGE YOUR DIABETES BEFORE AND AFTER SURGERY  Why is it important to control my blood sugar before and after surgery? Improving blood sugar levels before and after surgery helps healing and can limit problems. A way of improving blood sugar control is eating a healthy diet by:  Eating less sugar and carbohydrates  Increasing activity/exercise  Talking with your doctor about reaching your blood sugar goals High blood sugars (greater than 180 mg/dL) can raise your risk of infections and slow your recovery, so you will need to focus on controlling your diabetes during the weeks before surgery. Make sure that the doctor who takes care of your diabetes knows about  your planned surgery including the date and location.  How do I manage my blood sugar before surgery? Check your blood sugar at least 4 times a day, starting 2 days before surgery, to make sure that the level is not too high or low.  Check your blood sugar the morning of your surgery when you wake up and every 2 hours until you get to the Short Stay unit.  If your blood sugar is less than 70 mg/dL, you will need to treat for low blood sugar: Do not take insulin. Treat a low blood sugar (less than 70 mg/dL) with  cup of clear juice (cranberry or apple), 4 glucose tablets, OR glucose gel. Recheck blood sugar in 15 minutes after treatment (to make sure it is greater than 70 mg/dL). If your blood sugar is not greater than 70 mg/dL on recheck, call [phone] for further instructions. Report your blood sugar to the short stay nurse when you get to Short Stay.  If you are admitted to the hospital after surgery: Your blood sugar will be checked by the staff and you will probably be given insulin after surgery (instead of oral diabetes medicines) to make sure you have good blood sugar levels. The goal for blood sugar control after surgery is 80-180 mg/dL.    After your COVID test   You are not required to quarantine however you are required to wear a well-fitting mask when you are out and around people not in your household.  If your mask becomes wet or soiled, replace with a new one.  Wash your hands often with  soap and water for 20 seconds or clean your hands with an alcohol-based hand sanitizer that contains at least 60% alcohol.  Do not share personal items.  Notify your provider: if you are in close contact with someone who has COVID  or if you develop a fever of 100.4 or greater, sneezing, cough, sore throat, shortness of breath or body aches.           Do not wear jewelry or makeup Do not wear lotions, powders, perfumes, or deodorant. Do not shave 48 hours prior to surgery.   Do  not bring valuables to the hospital. DO Not wear nail polish, gel polish, artificial nails, or any other type of covering on natural nails including finger and toenails. If patients have artificial nails, gel coating, etc. that need to be removed by a nail salon please have this removed prior to surgery or surgery may need to be canceled/delayed if the surgeon/ anesthesia feels like the patient is unable to be adequately monitored.              [facility] is not responsible for any belongings or valuables.  Do NOT Smoke (Tobacco/Vaping)  24 hours prior to your procedure If you use a CPAP at night, you may bring your mask for your overnight stay.   Contacts, glasses, dentures or bridgework may not be worn into surgery, please bring cases for these belongings   For patients admitted to the hospital, discharge time will be determined by your treatment team.   Patients discharged the day of surgery will not be allowed to drive home, and someone needs to stay with them for 24 hours.  NO VISITORS WILL BE ALLOWED IN PRE-OP WHERE PATIENTS ARE PREPPED FOR SURGERY.  ONLY 1 SUPPORT PERSON MAY BE PRESENT IN THE WAITING ROOM WHILE YOU ARE IN SURGERY.  IF YOU ARE TO BE ADMITTED, ONCE YOU ARE IN YOUR ROOM YOU WILL BE ALLOWED TWO (2) VISITORS. 1 (ONE) VISITOR MAY STAY OVERNIGHT BUT MUST ARRIVE TO THE ROOM BY 8pm.  Minor children may have two parents present. Special consideration for safety and communication needs will be reviewed on a case by case basis.  Special instructions:    Oral Hygiene is also important to reduce your risk of infection.  Remember - BRUSH YOUR TEETH THE MORNING OF SURGERY WITH YOUR REGULAR TOOTHPASTE   [facility]- Preparing For Surgery  Before surgery, you can play an important role. Because skin is not sterile, your skin needs to be as free of germs as possible. You can reduce the number of germs on your skin by washing with CHG (chlorahexidine gluconate) Soap before surgery.  CHG  is an antiseptic cleaner which kills germs and bonds with the skin to continue killing germs even after washing.     Please do not use if you have an allergy to CHG or antibacterial soaps. If your skin becomes reddened/irritated stop using the CHG.  Do not shave (including legs and underarms) for at least 48 hours prior to first CHG shower. It is OK to shave your face.  Please follow these instructions carefully.     Shower the NIGHT BEFORE SURGERY and the MORNING OF SURGERY with CHG Soap.   If you chose to wash your hair, wash your hair first as usual with your normal shampoo. After you shampoo, rinse your hair and body thoroughly to remove the shampoo.  Then [employer] and genitals (private parts) with your normal soap and rinse thoroughly to  remove soap.  After that Use CHG Soap as you would any other liquid soap. You can apply CHG directly to the skin and wash gently with a scrungie or a clean washcloth.   Apply the CHG Soap to your body ONLY FROM THE NECK DOWN.  Do not use on open wounds or open sores. Avoid contact with your eyes, ears, mouth and genitals (private parts). Wash Face and genitals (private parts)  with your normal soap.   Wash thoroughly, paying special attention to the area where your surgery will be performed.  Thoroughly rinse your body with warm water from the neck down.  DO NOT shower/wash with your normal soap after using and rinsing off the CHG Soap.  Pat yourself dry with a CLEAN TOWEL.  Wear CLEAN PAJAMAS to bed the night before surgery  Place CLEAN SHEETS on your bed the night before your surgery  DO NOT SLEEP WITH PETS.   Day of Surgery:  Take a shower with CHG soap. Wear Clean/Comfortable clothing the morning of surgery Do not apply any deodorants/lotions.   Remember to brush your teeth WITH YOUR REGULAR TOOTHPASTE.   Please read over the following fact sheets that you were given.

## 2020-11-28 ENCOUNTER — Encounter (HOSPITAL_COMMUNITY): Payer: Self-pay

## 2020-11-28 ENCOUNTER — Encounter (HOSPITAL_COMMUNITY)
Admission: RE | Admit: 2020-11-28 | Discharge: 2020-11-28 | Disposition: A | Discharge status: Home / Self Care | Payer: Medicare HMO | Source: Ambulatory Visit | Attending: Neurological Surgery | Admitting: Neurological Surgery

## 2020-11-28 ENCOUNTER — Other Ambulatory Visit: Payer: Self-pay

## 2020-11-28 DIAGNOSIS — Z7982 Long term (current) use of aspirin: Secondary | ICD-10-CM | POA: Insufficient documentation

## 2020-11-28 DIAGNOSIS — Z79899 Other long term (current) drug therapy: Secondary | ICD-10-CM | POA: Insufficient documentation

## 2020-11-28 DIAGNOSIS — E119 Type 2 diabetes mellitus without complications: Secondary | ICD-10-CM | POA: Insufficient documentation

## 2020-11-28 DIAGNOSIS — Y838 Other surgical procedures as the cause of abnormal reaction of the patient, or of later complication, without mention of misadventure at the time of the procedure: Secondary | ICD-10-CM | POA: Insufficient documentation

## 2020-11-28 DIAGNOSIS — F17210 Nicotine dependence, cigarettes, uncomplicated: Secondary | ICD-10-CM | POA: Insufficient documentation

## 2020-11-28 DIAGNOSIS — I10 Essential (primary) hypertension: Secondary | ICD-10-CM | POA: Insufficient documentation

## 2020-11-28 DIAGNOSIS — Z7984 Long term (current) use of oral hypoglycemic drugs: Secondary | ICD-10-CM | POA: Insufficient documentation

## 2020-11-28 DIAGNOSIS — E785 Hyperlipidemia, unspecified: Secondary | ICD-10-CM | POA: Insufficient documentation

## 2020-11-28 DIAGNOSIS — Z20822 Contact with and (suspected) exposure to covid-19: Secondary | ICD-10-CM | POA: Insufficient documentation

## 2020-11-28 DIAGNOSIS — Z01812 Encounter for preprocedural laboratory examination: Secondary | ICD-10-CM | POA: Insufficient documentation

## 2020-11-28 DIAGNOSIS — Z96641 Presence of right artificial hip joint: Secondary | ICD-10-CM | POA: Insufficient documentation

## 2020-11-28 DIAGNOSIS — I2510 Atherosclerotic heart disease of native coronary artery without angina pectoris: Secondary | ICD-10-CM | POA: Insufficient documentation

## 2020-11-28 DIAGNOSIS — M960 Pseudarthrosis after fusion or arthrodesis: Secondary | ICD-10-CM | POA: Insufficient documentation

## 2020-11-28 DIAGNOSIS — I6521 Occlusion and stenosis of right carotid artery: Secondary | ICD-10-CM | POA: Insufficient documentation

## 2020-11-28 DIAGNOSIS — Z7902 Long term (current) use of antithrombotics/antiplatelets: Secondary | ICD-10-CM | POA: Insufficient documentation

## 2020-11-28 DIAGNOSIS — G4733 Obstructive sleep apnea (adult) (pediatric): Secondary | ICD-10-CM | POA: Insufficient documentation

## 2020-11-28 LAB — BASIC METABOLIC PANEL
Anion gap: 7 (ref 5–15)
BUN: 9 mg/dL (ref 8–23)
CO2: 23 mmol/L (ref 22–32)
Calcium: 8.8 mg/dL — ABNORMAL LOW (ref 8.9–10.3)
Chloride: 103 mmol/L (ref 98–111)
Creatinine, Ser: 0.85 mg/dL (ref 0.44–1.00)
GFR, Estimated: >60 mL/min (ref >60)
Glucose, Bld: 133 mg/dL — ABNORMAL HIGH (ref 70–99)
Potassium: 4.2 mmol/L (ref 3.5–5.1)
Sodium: 133 mmol/L — ABNORMAL LOW (ref 135–145)

## 2020-11-28 LAB — PROTIME-INR
INR: 1 (ref 0.8–1.2)
Prothrombin Time: 13.3 seconds (ref 11.4–15.2)

## 2020-11-28 LAB — SURGICAL PCR SCREEN
MRSA, PCR: NEGATIVE
Staphylococcus aureus: NEGATIVE

## 2020-11-28 LAB — CBC
HCT: 33.5 % — ABNORMAL LOW (ref 36.0–46.0)
Hemoglobin: 9.9 g/dL — ABNORMAL LOW (ref 12.0–15.0)
MCH: 24.6 pg — ABNORMAL LOW (ref 26.0–34.0)
MCHC: 29.6 g/dL — ABNORMAL LOW (ref 30.0–36.0)
MCV: 83.1 fL (ref 80.0–100.0)
Platelets: 456 10*3/uL — ABNORMAL HIGH (ref 150–400)
RBC: 4.03 MIL/uL (ref 3.87–5.11)
RDW: 20.3 % — ABNORMAL HIGH (ref 11.5–15.5)
WBC: 9.2 10*3/uL (ref 4.0–10.5)
nRBC: 0 % (ref 0.0–0.2)

## 2020-11-28 LAB — SARS CORONAVIRUS 2 (TAT 6-24 HRS): SARS Coronavirus 2: NEGATIVE

## 2020-11-28 LAB — GLUCOSE, CAPILLARY: Glucose-Capillary: 131 mg/dL — ABNORMAL HIGH (ref 70–99)

## 2020-11-28 NOTE — Progress Notes (Addendum)
PCP - Fatima Sanger, FNP Cardiologist - Nanetta Batty, MD  PPM/ICD - denies Device Orders - n/a Rep Notified - n/a  Chest x-ray - 04/21/2020 EKG - 04/21/2020 Stress Test - 03/28/2018 ECHO - denies Cardiac Cath - denies  Sleep Study - denies CPAP - n/a  Fasting Blood Sugar - 106-109 Checks Blood Sugar once a day CBG today - 131 A1C - results requested from PCP. Per patient, she had A1C checked last week at PCP office.  Blood Thinner Instructions: Plavix - last dose on 11/26/2020  Aspirin Instructions: Aspirin - last dose - 11/26/2020  Patient was instructed: As of today, STOP taking any Aspirin (unless otherwise instructed by your surgeon) Aleve, Naproxen, Ibuprofen, Motrin, Advil, Goody's, BC's, all herbal medications, fish oil, and all vitamins.   ERAS Protcol - no  COVID TEST- done in PAT on 11/28/2020  Anesthesia review: yes - cardiac history; cardiac clearance on 11/10/2020. In PAT BP was elevated when patient arrived - 160/85. Patient was asymptomatic and verbalized that she didn't have her BP medication this morning. At the end of appointment, BP was 137/66; Hgb 9.9 in PAT.  Patient denies shortness of breath, fever, cough and chest pain at PAT appointment   All instructions explained to the patient, with a verbal understanding of the material. Patient agrees to go over the instructions while at home for a better understanding. Patient also instructed to self quarantine after being tested for COVID-19. The opportunity to ask questions was provided.

## 2020-11-28 NOTE — Progress Notes (Addendum)
Abnormal lab in PAT - Hgb 9.9. Dr. Yetta Barre office was called and a message was left to [facility], the surgical scheduler. A message was send to Dr. Yetta Barre via [employer].

## 2020-11-29 ENCOUNTER — Other Ambulatory Visit: Payer: Self-pay | Admitting: Neurological Surgery

## 2020-11-29 NOTE — Progress Notes (Signed)
Anesthesia Chart Review:   Case: [id number] Date/Time: 11/30/20 1333   Procedures:      posterior lateral fusion - L4-L5 - L5-S1, revision of hardware L1-5 with extension to S1 and S2 alar with Airo (Back)     APPLICATION OF INTRAOPERATIVE CT SCAN   Anesthesia type: General   Pre-op diagnosis: Pseudoarthrosis   Location: [facility] OR ROOM 21 / [place] [facility]   Surgeons: Eustace Moore, MD       DISCUSSION: Pt is 70 years old. History includes smoking, HTN, DM2, HLD, CAD, carotid artery stenosis (s/p right carotid endarterectomy 09/27/11), OSA (reportedly "fixed" after T&A), varicose veins (s/p vein stripping 2008), THR (right 08/22/18), hysterectomy (01/09/99).    Hgb 9.9 at pre-admission testing. Pt's baseline appears to be ~12 prior to February 2022. In March 2022, hgb dropped to 8.1 in the context of a lumbar surgical wound infection after PLIF on 04/25/20. Post-op on 05/27/20 after irrigation and debridement of lumbar wound on 05/25/20, hgb was 6.4 and pt received 2 units PRBCs. Since then, hgb increased to 9.2, 9.6, and is now 9.9  Pt to hold Plavix 5-7 days before her procedure and aspirin 3-5 days pre op   VS: BP (!) 160/85   Pulse 84   Temp 36.4 C   Resp 18   Ht _0  (1.6 m)   Wt 61.6 kg   SpO2 100%   BMI 24.07 kg/m   PROVIDERS: - PCP is Sonia Side., FNP - Cardiologist is Quay Burow, MD. Last office visit 12/08/19. Cleared for surgery by Marrianne Mood, PA on 11/10/20 - Servando Snare, MD is vascular surgeon. Last visit 10/27/19 with Risa Grill, PA-C. One year follow-up for carotid disease recommended. This is scheduled for early November 2022   LABS:  - Hgb 9.9. Pt's baseline appears to be ~12. In March 2022, hgb dropped to 8.1 in the context of a lumbar surgical wound infection. Post-op on 05/27/20 after irrigation and debridement of lumbar wound on 05/25/20, hgb was 6.4 and pt received 2 units PRBCs. Since then, hgb has been 9.2, 9.6, and now 9.9  (all labs ordered are listed,  but only abnormal results are displayed)  Labs Reviewed  GLUCOSE, CAPILLARY - Abnormal; Notable for the following components:      Result Value   Glucose-Capillary 131 (*)    All other components within normal limits  BASIC METABOLIC PANEL - Abnormal; Notable for the following components:   Sodium 133 (*)    Glucose, Bld 133 (*)    Calcium 8.8 (*)    All other components within normal limits  CBC - Abnormal; Notable for the following components:   Hemoglobin 9.9 (*)    HCT 33.5 (*)    MCH 24.6 (*)    MCHC 29.6 (*)    RDW 20.3 (*)    Platelets 456 (*)    All other components within normal limits  SURGICAL PCR SCREEN  SARS CORONAVIRUS 2 (TAT 6-24 HRS)  PROTIME-INR  TYPE AND SCREEN     IMAGES: CXR 04/22/20: No active cardiopulmonary disease.   EKG 04/21/20: NSR. Nonspecific T wave abnormality. Prolonged QT [QT 426, QTc 472 ms]   CV: Carotid US 10/09/2019 - Right Carotid: Evidence consistent with a total occlusion of the right ICA. Non-hemodynamically significant plaque <50% noted in the CCA.  - Left Carotid: Velocities in the left ICA are consistent with a 60-79% stenosis. Non-hemodynamically significant plaque <50% noted in the CCA. The ECA appears >50% stenosed.  -  Vertebrals:  Bilateral vertebral arteries demonstrate antegrade flow.  - Subclavians: Normal flow hemodynamics were seen in bilateral subclavian arteries.  - Incidental findings: Enlarged left thyroid gland, measuring 5.1 x 2.8 x 3.3 cm. The right thyroid gland measures 3.9 x 1.0 x 1.5 cm.    Myocardial Perfusion 03/28/2018 Nuclear stress EF: 68%. The left ventricular ejection fraction is hyperdynamic (>65%). There was no ST segment deviation noted during stress. The study is normal. This is a low risk study. Normal pharmacologic nuclear study with no evidence for a prior infarct or ischemia. Since the prior study in 2013 ischemia is no longer present.     LHC 01/16/2012 LM: Normal in size. 10% distal  stenosis LAD: Normal in size. 50% discrete ostial stenosis. In the proximal extending into the midsegment, there is a 50% tubular stenosis followed by a short aneurysmal segment. The rest of the vessel has minor irregularities. D1: 30% proximal stenosis. D2: 30% proximal stenosis. D3: Small with minor irregularities. LCX: Large in size and dominant. Minor irregularities. 30% proximal stenosis. OM1: Small in size with no significant disease. OM2: Small in size with no significant disease. OM3: Normal in size with diffuse 30% proximal disease. Posterior AV groove: Minor irregularities and gives the PDA and 2 PLA branches. RCA: Small and nondominant. Minor irregularities. Final Conclusions:   1. No evidence of obstructive coronary artery disease. There is moderate LAD disease which is diffuse as well as mild disease in the left circumflex. 2. Normal LV systolic function. 3. Moderate systemic hypertension with mildly elevated LVEDP and mild gradient across the aortic valve.    Past Medical History:  Diagnosis Date   Arthritis    Carotid artery narrowing    Coronary artery disease    Cardiac catheterization November 2013: 50% ostial LAD stenosis 50% mid stenosis. 30% disease in the left circumflex.   Diabetes mellitus, type 2 ([place])    Hyperlipidemia    Hypertension    Onychomycosis of toenail 07/31/2016   Sleep apnea       Had surgery to correct    Past Surgical History:  Procedure Laterality Date   ABDOMINAL HYSTERECTOMY     BACK SURGERY     CARDIAC CATHETERIZATION     CATARACT EXTRACTION, BILATERAL Bilateral 2021   Dr. Sherral Hammers   ENDARTERECTOMY  09/27/2011   Procedure: RIGT ENDARTERECTOMY CAROTID;  Surgeon: Mal Misty, MD;  Location: [facility];  Service: Vascular;  Laterality: Right;   EPIDURAL BLOCK INJECTION  02/2008   Drs. Eulis Manly   gyn surgery  2004   total hysterectomy for mennorhagia,,salpingoophorectomy   LUMBAR WOUND DEBRIDEMENT N/A 05/25/2020   Procedure:  LUMBAR WOUND IRRIGATION AND DEBRIDEMENT;  Surgeon: Eustace Moore, MD;  Location: [facility];  Service: Neurosurgery;  Laterality: N/A;   POSTERIOR LUMBAR FUSION 4 LEVEL N/A 04/25/2020   Procedure: POSTERIOR LUMBAR INTERBODY FUSION LUMBAR ONE-TWO, LUMBAR TWO-THREE, LUMBAR THREE-FOUR,LUMBAR FOUR-FIVE.;  Surgeon: Eustace Moore, MD;  Location: [facility];  Service: Neurosurgery;  Laterality: N/A;  posterior   TONSILLECTOMY     TOTAL ABDOMINAL HYSTERECTOMY W/ BILATERAL SALPINGOOPHORECTOMY Bilateral 2000   TOTAL HIP ARTHROPLASTY Right 08/22/2018   Procedure: TOTAL HIP ARTHROPLASTY;  Surgeon: Earlie Server, MD;  Location: [facility];  Service: Orthopedics;  Laterality: Right;   VARICOSE VEIN SURGERY  2008   stripping    MEDICATIONS:  allopurinol (ZYLOPRIM) 300 MG tablet   amLODipine (NORVASC) 10 MG tablet   amLODipine (NORVASC) 5 MG tablet   aspirin EC 81 MG tablet  atorvastatin (LIPITOR) 80 MG tablet   blood glucose meter kit and supplies KIT   carvedilol (COREG CR) 20 MG 24 hr capsule   clopidogrel (PLAVIX) 75 MG tablet   ezetimibe (ZETIA) 10 MG tablet   gabapentin (NEURONTIN) 300 MG capsule   gabapentin (NEURONTIN) 600 MG tablet   glucose blood test strip   metFORMIN (GLUCOPHAGE) 500 MG tablet   methocarbamol (ROBAXIN) 500 MG tablet   MITIGARE 0.6 MG CAPS   oxyCODONE-acetaminophen (PERCOCET) 10-325 MG tablet   oxyCODONE-acetaminophen (PERCOCET/ROXICET) 5-325 MG tablet   No current facility-administered medications for this encounter.   - Pt to hold Plavix 5-7 days before her procedure and aspirin 3-5 days pre op   If no changes, I anticipate pt can proceed with surgery as scheduled.   Willeen Cass, PhD, FNP-BC [facility]/Anesthesiology Phone: [phone] 11/29/2020 10:11 AM

## 2020-11-29 NOTE — Anesthesia Preprocedure Evaluation (Addendum)
Anesthesia Evaluation  Patient identified by MRN, date of birth, ID band Patient awake    Reviewed: Allergy & Precautions, NPO status , Patient's Chart, lab work & pertinent test results  Airway Mallampati: II  TM Distance: >3 FB Neck ROM: Full    Dental  (+) Dental Advisory Given   Pulmonary sleep apnea , Current Smoker and Patient abstained from smoking.,    breath sounds clear to auscultation       Cardiovascular hypertension, Pt. on medications and Pt. on home beta blockers + CAD   Rhythm:Regular Rate:Normal     Neuro/Psych  Neuromuscular disease    GI/Hepatic negative GI ROS, Neg liver ROS,   Endo/Other  diabetes, Type 2, Oral Hypoglycemic Agents  Renal/GU Renal disease     Musculoskeletal  (+) Arthritis ,   Abdominal   Peds  Hematology  (+) anemia ,   Anesthesia Other Findings   Reproductive/Obstetrics                           Lab Results  Component Value Date   WBC 9.2 11/28/2020   HGB 9.9 (L) 11/28/2020   HCT 33.5 (L) 11/28/2020   MCV 83.1 11/28/2020   PLT 456 (H) 11/28/2020   Lab Results  Component Value Date   CREATININE 0.85 11/28/2020   BUN 9 11/28/2020   NA 133 (L) 11/28/2020   K 4.2 11/28/2020   CL 103 11/28/2020   CO2 23 11/28/2020    Anesthesia Physical Anesthesia Plan  ASA: 3  Anesthesia Plan: General   Post-op Pain Management:    Induction: Intravenous  PONV Risk Score and Plan: 2 and Dexamethasone, Ondansetron and Treatment may vary due to age or medical condition  Airway Management Planned: Oral ETT  Additional Equipment: Arterial line  Intra-op Plan:   Post-operative Plan: Extubation in OR  Informed Consent: I have reviewed the patients History and Physical, chart, labs and discussed the procedure including the risks, benefits and alternatives for the proposed anesthesia with the patient or authorized representative who has indicated his/her  understanding and acceptance.     Dental advisory given  Plan Discussed with: CRNA  Anesthesia Plan Comments: (Plan 2IV's, GETA, A-line. Type and cross. )      Anesthesia Quick Evaluation

## 2020-11-30 ENCOUNTER — Inpatient Hospital Stay (HOSPITAL_COMMUNITY): Payer: Medicare HMO | Admitting: Emergency Medicine

## 2020-11-30 ENCOUNTER — Inpatient Hospital Stay (HOSPITAL_COMMUNITY): Payer: Medicare HMO

## 2020-11-30 ENCOUNTER — Inpatient Hospital Stay (HOSPITAL_COMMUNITY)
Admission: RE | Admit: 2020-11-30 | Discharge: 2020-12-03 | DRG: 460 | Disposition: A | Discharge status: Home Health | Payer: Medicare HMO | Source: Ambulatory Visit | Attending: Neurological Surgery | Admitting: Neurological Surgery

## 2020-11-30 ENCOUNTER — Encounter (HOSPITAL_COMMUNITY): Payer: Self-pay | Admitting: Neurological Surgery

## 2020-11-30 ENCOUNTER — Inpatient Hospital Stay (HOSPITAL_COMMUNITY): Payer: Medicare HMO | Admitting: Anesthesiology

## 2020-11-30 ENCOUNTER — Encounter (HOSPITAL_COMMUNITY)
Admission: RE | Disposition: A | Discharge status: Home Health | Payer: Self-pay | Source: Ambulatory Visit | Attending: Neurological Surgery

## 2020-11-30 DIAGNOSIS — E119 Type 2 diabetes mellitus without complications: Secondary | ICD-10-CM | POA: Diagnosis present

## 2020-11-30 DIAGNOSIS — Z7902 Long term (current) use of antithrombotics/antiplatelets: Secondary | ICD-10-CM

## 2020-11-30 DIAGNOSIS — Y838 Other surgical procedures as the cause of abnormal reaction of the patient, or of later complication, without mention of misadventure at the time of the procedure: Secondary | ICD-10-CM | POA: Diagnosis present

## 2020-11-30 DIAGNOSIS — Z20822 Contact with and (suspected) exposure to covid-19: Secondary | ICD-10-CM | POA: Diagnosis present

## 2020-11-30 DIAGNOSIS — Z7982 Long term (current) use of aspirin: Secondary | ICD-10-CM | POA: Diagnosis not present

## 2020-11-30 DIAGNOSIS — Z96641 Presence of right artificial hip joint: Secondary | ICD-10-CM | POA: Diagnosis present

## 2020-11-30 DIAGNOSIS — L0291 Cutaneous abscess, unspecified: Secondary | ICD-10-CM | POA: Diagnosis not present

## 2020-11-30 DIAGNOSIS — Z809 Family history of malignant neoplasm, unspecified: Secondary | ICD-10-CM

## 2020-11-30 DIAGNOSIS — M960 Pseudarthrosis after fusion or arthrodesis: Secondary | ICD-10-CM | POA: Diagnosis present

## 2020-11-30 DIAGNOSIS — I2510 Atherosclerotic heart disease of native coronary artery without angina pectoris: Secondary | ICD-10-CM | POA: Diagnosis not present

## 2020-11-30 DIAGNOSIS — F17210 Nicotine dependence, cigarettes, uncomplicated: Secondary | ICD-10-CM | POA: Diagnosis not present

## 2020-11-30 DIAGNOSIS — Z981 Arthrodesis status: Secondary | ICD-10-CM | POA: Diagnosis not present

## 2020-11-30 DIAGNOSIS — Z79899 Other long term (current) drug therapy: Secondary | ICD-10-CM

## 2020-11-30 DIAGNOSIS — Z888 Allergy status to other drugs, medicaments and biological substances status: Secondary | ICD-10-CM

## 2020-11-30 DIAGNOSIS — Z7984 Long term (current) use of oral hypoglycemic drugs: Secondary | ICD-10-CM | POA: Diagnosis not present

## 2020-11-30 DIAGNOSIS — Z8249 Family history of ischemic heart disease and other diseases of the circulatory system: Secondary | ICD-10-CM | POA: Diagnosis not present

## 2020-11-30 DIAGNOSIS — Z885 Allergy status to narcotic agent status: Secondary | ICD-10-CM

## 2020-11-30 DIAGNOSIS — Y793 Surgical instruments, materials and orthopedic devices (including sutures) associated with adverse incidents: Secondary | ICD-10-CM | POA: Diagnosis present

## 2020-11-30 DIAGNOSIS — Z833 Family history of diabetes mellitus: Secondary | ICD-10-CM

## 2020-11-30 DIAGNOSIS — M549 Dorsalgia, unspecified: Secondary | ICD-10-CM | POA: Diagnosis present

## 2020-11-30 DIAGNOSIS — E785 Hyperlipidemia, unspecified: Secondary | ICD-10-CM | POA: Diagnosis present

## 2020-11-30 DIAGNOSIS — I10 Essential (primary) hypertension: Secondary | ICD-10-CM | POA: Diagnosis present

## 2020-11-30 HISTORY — PX: APPLICATION OF INTRAOPERATIVE CT SCAN: SHX6668

## 2020-11-30 HISTORY — PX: LAMINECTOMY WITH POSTERIOR LATERAL ARTHRODESIS LEVEL 2: SHX6336

## 2020-11-30 LAB — GLUCOSE, CAPILLARY
Glucose-Capillary: 113 mg/dL — ABNORMAL HIGH (ref 70–99)
Glucose-Capillary: 185 mg/dL — ABNORMAL HIGH (ref 70–99)
Glucose-Capillary: 205 mg/dL — ABNORMAL HIGH (ref 70–99)

## 2020-11-30 LAB — PREPARE RBC (CROSSMATCH)

## 2020-11-30 SURGERY — LAMINECTOMY WITH POSTERIOR LATERAL ARTHRODESIS LEVEL 2
Anesthesia: General | Site: Spine Lumbar

## 2020-11-30 MED ORDER — DEXAMETHASONE SODIUM PHOSPHATE 10 MG/ML IJ SOLN: 10.0000 mg | Freq: Once | INTRAMUSCULAR | Status: DC

## 2020-11-30 MED ORDER — CHLORHEXIDINE GLUCONATE 0.12 % MT SOLN
15.0000 mL | Freq: Once | OROMUCOSAL | Status: AC
  Administered 2020-11-30: 15 mL via OROMUCOSAL
  Filled 2020-11-30: qty 15

## 2020-11-30 MED ORDER — KETAMINE HCL 50 MG/5ML IJ SOSY
PREFILLED_SYRINGE | INTRAMUSCULAR | Status: AC
  Filled 2020-11-30: qty 5

## 2020-11-30 MED ORDER — 0.9 % SODIUM CHLORIDE (POUR BTL) OPTIME
TOPICAL | Status: DC | PRN
  Administered 2020-11-30: 1000 mL

## 2020-11-30 MED ORDER — SODIUM CHLORIDE 0.9 % IV SOLN: 250.0000 mL | INTRAVENOUS | Status: DC

## 2020-11-30 MED ORDER — ONDANSETRON HCL 4 MG/2ML IJ SOLN: 4.0000 mg | Freq: Four times a day (QID) | INTRAMUSCULAR | Status: DC | PRN

## 2020-11-30 MED ORDER — FENTANYL CITRATE (PF) 250 MCG/5ML IJ SOLN
INTRAMUSCULAR | Status: AC
  Filled 2020-11-30: qty 5

## 2020-11-30 MED ORDER — ASPIRIN EC 81 MG PO TBEC
81.0000 mg | DELAYED_RELEASE_TABLET | Freq: Every day | ORAL | Status: DC
  Administered 2020-12-01 – 2020-12-03 (×3): 81 mg via ORAL
  Filled 2020-11-30 (×3): qty 1

## 2020-11-30 MED ORDER — THROMBIN 5000 UNITS EX SOLR
CUTANEOUS | Status: AC
  Filled 2020-11-30: qty 5000

## 2020-11-30 MED ORDER — PHENYLEPHRINE HCL-NACL 20-0.9 MG/250ML-% IV SOLN
INTRAVENOUS | Status: DC | PRN
  Administered 2020-11-30: 20 ug/min via INTRAVENOUS

## 2020-11-30 MED ORDER — LIDOCAINE 2% (20 MG/ML) 5 ML SYRINGE
INTRAMUSCULAR | Status: DC | PRN
  Administered 2020-11-30: 60 mg via INTRAVENOUS

## 2020-11-30 MED ORDER — ACETAMINOPHEN 650 MG RE SUPP: 650.0000 mg | RECTAL | Status: DC | PRN

## 2020-11-30 MED ORDER — HYDROMORPHONE HCL 1 MG/ML IJ SOLN
0.5000 mg | INTRAMUSCULAR | Status: DC | PRN
Start: 2020-11-30 — End: 2020-12-03
  Administered 2020-11-30: 0.5 mg via INTRAVENOUS
  Filled 2020-11-30: qty 0.5

## 2020-11-30 MED ORDER — OXYCODONE-ACETAMINOPHEN 10-325 MG PO TABS: 1.0000 | ORAL_TABLET | ORAL | Status: DC | PRN

## 2020-11-30 MED ORDER — SODIUM CHLORIDE 0.9% FLUSH: 3.0000 mL | INTRAVENOUS | Status: DC | PRN

## 2020-11-30 MED ORDER — PHENYLEPHRINE 40 MCG/ML (10ML) SYRINGE FOR IV PUSH (FOR BLOOD PRESSURE SUPPORT)
PREFILLED_SYRINGE | INTRAVENOUS | Status: DC | PRN
  Administered 2020-11-30: 80 ug via INTRAVENOUS
  Administered 2020-11-30: 120 ug via INTRAVENOUS

## 2020-11-30 MED ORDER — ACETAMINOPHEN 500 MG PO TABS
1000.0000 mg | ORAL_TABLET | ORAL | Status: DC
  Filled 2020-11-30: qty 2

## 2020-11-30 MED ORDER — SODIUM CHLORIDE 0.9 % IV SOLN: 10.0000 mL/h | Freq: Once | INTRAVENOUS | Status: DC

## 2020-11-30 MED ORDER — GABAPENTIN 600 MG PO TABS
600.0000 mg | ORAL_TABLET | Freq: Every day | ORAL | Status: DC
  Administered 2020-11-30 – 2020-12-02 (×3): 600 mg via ORAL
  Filled 2020-11-30 (×3): qty 1

## 2020-11-30 MED ORDER — VANCOMYCIN HCL 1000 MG IV SOLR
INTRAVENOUS | Status: DC | PRN
  Administered 2020-11-30: 1000 mg via TOPICAL

## 2020-11-30 MED ORDER — MIDAZOLAM HCL 2 MG/2ML IJ SOLN
INTRAMUSCULAR | Status: DC | PRN
  Administered 2020-11-30: 2 mg via INTRAVENOUS

## 2020-11-30 MED ORDER — INSULIN ASPART 100 UNIT/ML IJ SOLN
0.0000 [IU] | Freq: Three times a day (TID) | INTRAMUSCULAR | Status: DC
Start: 2020-12-01 — End: 2020-12-03
  Administered 2020-12-01: 2 [IU] via SUBCUTANEOUS
  Administered 2020-12-01: 3 [IU] via SUBCUTANEOUS
  Administered 2020-12-01: 5 [IU] via SUBCUTANEOUS
  Administered 2020-12-02 (×2): 2 [IU] via SUBCUTANEOUS

## 2020-11-30 MED ORDER — DEXAMETHASONE SODIUM PHOSPHATE 10 MG/ML IJ SOLN
INTRAMUSCULAR | Status: DC | PRN
  Administered 2020-11-30: 10 mg via INTRAVENOUS

## 2020-11-30 MED ORDER — PROPOFOL 10 MG/ML IV BOLUS
INTRAVENOUS | Status: DC | PRN
Start: 2020-11-30 — End: 2020-11-30
  Administered 2020-11-30: 100 mg via INTRAVENOUS

## 2020-11-30 MED ORDER — BUPIVACAINE HCL (PF) 0.25 % IJ SOLN
INTRAMUSCULAR | Status: DC | PRN
  Administered 2020-11-30: 7 mL

## 2020-11-30 MED ORDER — ROCURONIUM BROMIDE 10 MG/ML (PF) SYRINGE
PREFILLED_SYRINGE | INTRAVENOUS | Status: DC | PRN
  Administered 2020-11-30: 20 mg via INTRAVENOUS
  Administered 2020-11-30: 50 mg via INTRAVENOUS

## 2020-11-30 MED ORDER — CARVEDILOL PHOSPHATE ER 20 MG PO CP24
20.0000 mg | ORAL_CAPSULE | Freq: Every day | ORAL | Status: DC
  Administered 2020-12-01 – 2020-12-03 (×3): 20 mg via ORAL
  Filled 2020-11-30 (×3): qty 1

## 2020-11-30 MED ORDER — CEFAZOLIN SODIUM-DEXTROSE 2-3 GM-%(50ML) IV SOLR
INTRAVENOUS | Status: DC | PRN
  Administered 2020-11-30: 2 g via INTRAVENOUS

## 2020-11-30 MED ORDER — BUPIVACAINE HCL (PF) 0.25 % IJ SOLN
INTRAMUSCULAR | Status: AC
  Filled 2020-11-30: qty 30

## 2020-11-30 MED ORDER — FENTANYL CITRATE (PF) 100 MCG/2ML IJ SOLN
INTRAMUSCULAR | Status: AC
  Filled 2020-11-30: qty 2

## 2020-11-30 MED ORDER — OXYCODONE HCL 5 MG PO TABS
5.0000 mg | ORAL_TABLET | ORAL | Status: DC | PRN
  Administered 2020-12-01 – 2020-12-03 (×11): 5 mg via ORAL
  Filled 2020-11-30 (×11): qty 1

## 2020-11-30 MED ORDER — AMLODIPINE BESYLATE 5 MG PO TABS
10.0000 mg | ORAL_TABLET | Freq: Every day | ORAL | Status: DC
  Administered 2020-12-01 – 2020-12-03 (×3): 10 mg via ORAL
  Filled 2020-11-30 (×3): qty 2

## 2020-11-30 MED ORDER — GABAPENTIN 300 MG PO CAPS
300.0000 mg | ORAL_CAPSULE | ORAL | Status: DC
  Filled 2020-11-30: qty 1

## 2020-11-30 MED ORDER — MIDAZOLAM HCL 2 MG/2ML IJ SOLN
INTRAMUSCULAR | Status: AC
  Filled 2020-11-30: qty 2

## 2020-11-30 MED ORDER — PROPOFOL 10 MG/ML IV BOLUS
INTRAVENOUS | Status: AC
  Filled 2020-11-30: qty 20

## 2020-11-30 MED ORDER — EZETIMIBE 10 MG PO TABS
10.0000 mg | ORAL_TABLET | Freq: Every day | ORAL | Status: DC
  Administered 2020-12-01 – 2020-12-03 (×3): 10 mg via ORAL
  Filled 2020-11-30 (×3): qty 1

## 2020-11-30 MED ORDER — CEFAZOLIN SODIUM-DEXTROSE 2-4 GM/100ML-% IV SOLN
2.0000 g | Freq: Three times a day (TID) | INTRAVENOUS | Status: AC
  Administered 2020-11-30 – 2020-12-01 (×2): 2 g via INTRAVENOUS
  Filled 2020-11-30 (×2): qty 100

## 2020-11-30 MED ORDER — POTASSIUM CHLORIDE IN NACL 20-0.9 MEQ/L-% IV SOLN
INTRAVENOUS | Status: DC
  Filled 2020-11-30 (×2): qty 1000

## 2020-11-30 MED ORDER — VANCOMYCIN HCL 1000 MG IV SOLR
INTRAVENOUS | Status: AC
  Filled 2020-11-30: qty 20

## 2020-11-30 MED ORDER — THROMBIN 5000 UNITS EX SOLR: OROMUCOSAL | Status: DC | PRN

## 2020-11-30 MED ORDER — ACETAMINOPHEN 325 MG PO TABS
650.0000 mg | ORAL_TABLET | ORAL | Status: DC | PRN
  Filled 2020-11-30: qty 2

## 2020-11-30 MED ORDER — METFORMIN HCL 500 MG PO TABS
500.0000 mg | ORAL_TABLET | Freq: Two times a day (BID) | ORAL | Status: DC
  Administered 2020-12-01 – 2020-12-03 (×5): 500 mg via ORAL
  Filled 2020-11-30 (×5): qty 1

## 2020-11-30 MED ORDER — SODIUM CHLORIDE 0.9% FLUSH
3.0000 mL | Freq: Two times a day (BID) | INTRAVENOUS | Status: DC
  Administered 2020-11-30 – 2020-12-03 (×4): 3 mL via INTRAVENOUS

## 2020-11-30 MED ORDER — FENTANYL CITRATE (PF) 100 MCG/2ML IJ SOLN
25.0000 ug | INTRAMUSCULAR | Status: DC | PRN
  Administered 2020-11-30 (×3): 50 ug via INTRAVENOUS

## 2020-11-30 MED ORDER — KETOROLAC TROMETHAMINE 15 MG/ML IJ SOLN
7.5000 mg | Freq: Four times a day (QID) | INTRAMUSCULAR | Status: AC
  Administered 2020-12-01 (×3): 7.5 mg via INTRAVENOUS
  Filled 2020-11-30 (×3): qty 1

## 2020-11-30 MED ORDER — METHOCARBAMOL 500 MG PO TABS
500.0000 mg | ORAL_TABLET | Freq: Four times a day (QID) | ORAL | Status: DC
  Administered 2020-11-30 – 2020-12-03 (×9): 500 mg via ORAL
  Filled 2020-11-30 (×9): qty 1

## 2020-11-30 MED ORDER — SUGAMMADEX SODIUM 200 MG/2ML IV SOLN
INTRAVENOUS | Status: DC | PRN
  Administered 2020-11-30: 150 mg via INTRAVENOUS

## 2020-11-30 MED ORDER — LACTATED RINGERS IV SOLN: INTRAVENOUS | Status: DC | PRN

## 2020-11-30 MED ORDER — ONDANSETRON HCL 4 MG/2ML IJ SOLN
INTRAMUSCULAR | Status: DC | PRN
  Administered 2020-11-30: 4 mg via INTRAVENOUS

## 2020-11-30 MED ORDER — KETAMINE HCL 10 MG/ML IJ SOLN
INTRAMUSCULAR | Status: DC | PRN
  Administered 2020-11-30 (×2): 10 mg via INTRAVENOUS
  Administered 2020-11-30: 30 mg via INTRAVENOUS

## 2020-11-30 MED ORDER — THROMBIN 20000 UNITS EX SOLR: CUTANEOUS | Status: DC | PRN

## 2020-11-30 MED ORDER — PHENOL 1.4 % MT LIQD: 1.0000 | OROMUCOSAL | Status: DC | PRN

## 2020-11-30 MED ORDER — OXYCODONE-ACETAMINOPHEN 5-325 MG PO TABS
1.0000 | ORAL_TABLET | ORAL | Status: DC | PRN
Start: 2020-11-30 — End: 2020-12-03
  Administered 2020-12-01 – 2020-12-03 (×11): 1 via ORAL
  Filled 2020-11-30 (×12): qty 1

## 2020-11-30 MED ORDER — SENNA 8.6 MG PO TABS
1.0000 | ORAL_TABLET | Freq: Two times a day (BID) | ORAL | Status: DC
  Administered 2020-11-30 – 2020-12-03 (×6): 8.6 mg via ORAL
  Filled 2020-11-30 (×5): qty 1

## 2020-11-30 MED ORDER — CEFAZOLIN SODIUM-DEXTROSE 2-4 GM/100ML-% IV SOLN
INTRAVENOUS | Status: AC
  Filled 2020-11-30: qty 100

## 2020-11-30 MED ORDER — EPHEDRINE SULFATE-NACL 50-0.9 MG/10ML-% IV SOSY
PREFILLED_SYRINGE | INTRAVENOUS | Status: DC | PRN
  Administered 2020-11-30 (×3): 5 mg via INTRAVENOUS

## 2020-11-30 MED ORDER — ORAL CARE MOUTH RINSE: 15.0000 mL | Freq: Once | OROMUCOSAL | Status: AC

## 2020-11-30 MED ORDER — FENTANYL CITRATE (PF) 250 MCG/5ML IJ SOLN
INTRAMUSCULAR | Status: DC | PRN
  Administered 2020-11-30 (×2): 50 ug via INTRAVENOUS
  Administered 2020-11-30: 100 ug via INTRAVENOUS
  Administered 2020-11-30 (×2): 25 ug via INTRAVENOUS

## 2020-11-30 MED ORDER — ALLOPURINOL 300 MG PO TABS
300.0000 mg | ORAL_TABLET | Freq: Every day | ORAL | Status: DC
  Administered 2020-12-01 – 2020-12-03 (×3): 300 mg via ORAL
  Filled 2020-11-30 (×3): qty 1

## 2020-11-30 MED ORDER — ONDANSETRON HCL 4 MG PO TABS: 4.0000 mg | ORAL_TABLET | Freq: Four times a day (QID) | ORAL | Status: DC | PRN

## 2020-11-30 MED ORDER — THROMBIN 20000 UNITS EX SOLR
CUTANEOUS | Status: AC
  Filled 2020-11-30: qty 20000

## 2020-11-30 MED ORDER — MENTHOL 3 MG MT LOZG: 1.0000 | LOZENGE | OROMUCOSAL | Status: DC | PRN

## 2020-11-30 MED ORDER — LACTATED RINGERS IV SOLN: INTRAVENOUS | Status: DC

## 2020-11-30 SURGICAL SUPPLY — 63 items
APL SKNCLS STERI-STRIP NONHPOA (GAUZE/BANDAGES/DRESSINGS) ×2
BAG COUNTER SPONGE SURGICOUNT (BAG) ×3 IMPLANT
BAG SPNG CNTER NS LX DISP (BAG) ×2
BASKET BONE COLLECTION (BASKET) ×1 IMPLANT
BENZOIN TINCTURE PRP APPL 2/3 (GAUZE/BANDAGES/DRESSINGS) ×3 IMPLANT
BONE CANC CHIPS 40CC CAN1/2 (Bone Implant) ×3 IMPLANT
BUR CARBIDE MATCH 3.0 (BURR) ×3 IMPLANT
CANISTER SUCT 3000ML PPV (MISCELLANEOUS) ×3 IMPLANT
CHIPS CANC BONE 40CC CAN1/2 (Bone Implant) ×2 IMPLANT
CNTNR URN SCR LID CUP LEK RST (MISCELLANEOUS) ×2 IMPLANT
CONT SPEC 4OZ STRL OR WHT (MISCELLANEOUS) ×3
COVER BACK TABLE 60X90IN (DRAPES) ×9 IMPLANT
DRAPE LAPAROTOMY 100X72X124 (DRAPES) ×3 IMPLANT
DRAPE SCAN PATIENT (DRAPES) ×4 IMPLANT
DRAPE SURG 17X23 STRL (DRAPES) ×3 IMPLANT
DRSG OPSITE 4X5.5 SM (GAUZE/BANDAGES/DRESSINGS) ×1 IMPLANT
DRSG OPSITE POSTOP 4X10 (GAUZE/BANDAGES/DRESSINGS) ×1 IMPLANT
DURAPREP 26ML APPLICATOR (WOUND CARE) ×3 IMPLANT
ELECT KIT SAFEOP EMG/SURF (KITS) ×3
ELECT REM PT RETURN 9FT ADLT (ELECTROSURGICAL) ×3
ELECTRODE KT SAFEOP EMG/SURF (KITS) IMPLANT
ELECTRODE REM PT RTRN 9FT ADLT (ELECTROSURGICAL) ×2 IMPLANT
EVACUATOR 1/8 PVC DRAIN (DRAIN) ×2 IMPLANT
GAUZE 4X4 16PLY ~~LOC~~+RFID DBL (SPONGE) ×2 IMPLANT
GLOVE SURG ENC MOIS LTX SZ7 (GLOVE) ×3 IMPLANT
GLOVE SURG ENC MOIS LTX SZ8 (GLOVE) ×10 IMPLANT
GLOVE SURG UNDER POLY LF SZ7 (GLOVE) ×3 IMPLANT
GOWN STRL REUS W/ TWL LRG LVL3 (GOWN DISPOSABLE) IMPLANT
GOWN STRL REUS W/ TWL XL LVL3 (GOWN DISPOSABLE) ×4 IMPLANT
GOWN STRL REUS W/TWL 2XL LVL3 (GOWN DISPOSABLE) IMPLANT
GOWN STRL REUS W/TWL LRG LVL3 (GOWN DISPOSABLE) ×9
GOWN STRL REUS W/TWL XL LVL3 (GOWN DISPOSABLE) ×9
GRAFT BNE CHIP CANC 1-8 40 (Bone Implant) IMPLANT
GRAFT TRIN ELITE MED MUSC TRAN (Graft) ×1 IMPLANT
GRAFT TRINITY ELITE LGE HUMAN (Tissue) ×1 IMPLANT
HEMOSTAT POWDER KIT SURGIFOAM (HEMOSTASIS) ×1 IMPLANT
KIT BASIN OR (CUSTOM PROCEDURE TRAY) ×3 IMPLANT
KIT GRAFTMAG DEL NEURO DISP (NEUROSURGERY SUPPLIES) ×1 IMPLANT
KIT TURNOVER KIT B (KITS) ×3 IMPLANT
MARKER SPHERE PSV REFLC 13MM (MARKER) ×18 IMPLANT
NDL HYPO 25X1 1.5 SAFETY (NEEDLE) ×2 IMPLANT
NEEDLE HYPO 25X1 1.5 SAFETY (NEEDLE) ×3 IMPLANT
NS IRRIG 1000ML POUR BTL (IV SOLUTION) ×3 IMPLANT
PACK LAMINECTOMY NEURO (CUSTOM PROCEDURE TRAY) ×3 IMPLANT
ROD TI STRAIGHT 5.5X500 (Rod) ×1 IMPLANT
SCREW CANC PA 7.5X35 (Screw) ×1 IMPLANT
SCREW CANC PA 7.5X40 (Screw) ×1 IMPLANT
SCREW KODIAK 7.5X40 (Screw) ×2 IMPLANT
SCREW KODIAK 8.5X90 ILIAC (Screw) ×2 IMPLANT
SET SCREW (Screw) ×36 IMPLANT
SET SCREW SPNE (Screw) IMPLANT
SPONGE SURGIFOAM ABS GEL 100 (HEMOSTASIS) ×3 IMPLANT
SPONGE T-LAP 4X18 ~~LOC~~+RFID (SPONGE) ×1 IMPLANT
STAPLER VISISTAT 35W (STAPLE) ×1 IMPLANT
STRIP CLOSURE SKIN 1/2X4 (GAUZE/BANDAGES/DRESSINGS) ×5 IMPLANT
SUT VIC AB 0 CT1 18XCR BRD8 (SUTURE) ×2 IMPLANT
SUT VIC AB 0 CT1 8-18 (SUTURE) ×9
SUT VIC AB 2-0 CP2 18 (SUTURE) ×4 IMPLANT
SUT VIC AB 3-0 SH 8-18 (SUTURE) ×6 IMPLANT
TOWEL GREEN STERILE (TOWEL DISPOSABLE) ×3 IMPLANT
TOWEL GREEN STERILE FF (TOWEL DISPOSABLE) ×3 IMPLANT
TRAY FOLEY MTR SLVR 16FR STAT (SET/KITS/TRAYS/PACK) IMPLANT
WATER STERILE IRR 1000ML POUR (IV SOLUTION) ×3 IMPLANT

## 2020-11-30 NOTE — Op Note (Addendum)
11/30/2020  6:39 PM  PATIENT:  April Shaffer  70 y.o. 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  Lumbar reexploration with exploration of fusion L1-L5 with removal of instrumentation L1-L5 2.  Placement of S1 and S2 alar screws utilizing a Tec instrumentation and aero frameless stereotactic navigation 3.  Replacement of pedicle screws L1-L2 and L4 on the right 4. Intertransverse arthrodesis L2-3 L3-4 L4-5 and L5-S1 using morcellized autograft and allograft.  SURGEON:  Marikay Alar, MD  ASSISTANTS: Verlin Dike FNP  ANESTHESIA:  General  EBL: 500 ml  Total I/O In: 1000 [I.V.:1000] Out: 750 [Urine:250; Blood:500]  BLOOD ADMINISTERED:none  DRAINS: Bilateral medium Hemovac drains  INDICATION FOR PROCEDURE: This patient presented with back and bilateral leg pain.  She underwent decompression and instrumented fusion L1-L5 many months ago.  Imaging revealed suspected pseudoarthrosis L4-5 with subsidence of the grafts and loosening of the instrumentation. The patient tried a reasonable attempt at conservative medical measures without relief. I recommended bar reexploration with removal of loosened instrumentation and extension of the instrumentation to the pelvis with redo posterior lateral fusion.  Patient understood the risks, benefits, and alternatives and potential outcomes and wished to proceed.  PROCEDURE DETAILS:  The patient was brought to the operating room. After induction of generalized endotracheal anesthesia the patient was rolled into the prone position on the [place] table and all pressure points were padded. The patient's lumbar region was cleaned and then prepped with DuraPrep and draped in the usual sterile fashion. Anesthesia was injected and then a dorsal midline incision was made and carried down  to the lumbosacral fascia. The fascia was opened and the paraspinous musculature was taken down in a subperiosteal fashion to expose the previously placed instrumentation L1-L5.  During the exposure we saw a release of exudative fluid from around the L1-L2 region on the right.  We cultured this and she had had a previous infection months ago.. A self-retaining retractor was placed.  We remove the locking caps from L1-L5.  We explored the fusion and there was significant bony overgrowth over the L5 screws.  We remove this bony overgrowth.  The L5 pedicle screws were loose and pulled out easily.  The right L1-L2 and L4 screws were also loose and these were replaced with bigger pedicle screws.  We placed 6.5 x 40 mm pedicle screw at L1 on the right, 7.5 x 35 mm pedicle screw at L2 on the right, and a 7.5 x 40 mm pedicle screw at L4 on the right.  We cannot replace the L5 pedicle screws because of the bony lysis around the previously placed screws.  We spent considerable time then dissecting out over the lamina and facet of L5-S1 and identifying the sacrum and the space between the 1 and S2 foramina.  We then placed our clamp for our frameless stereotactic navigation on the T12 spinous process.  We then draped this and ran our navigation films.  We checked the quality of the imaging, scrubbed back in and checked the accuracy of the imaging.  We registered our instruments, our probes and are taps.  Registered our screws.  We then used the instrumentation to identify the S1 pedicle screw entry zone.  We probed the pedicle with a monitored probe utilizing navigation, tapped utilizing a navigated tap with a 6.5 tap, palpated with a ball probe, and then  placed 7.5 x 40 mm pedicle screws into the S1 pedicles achieving bicortical purchase.  We then localized the space between the S1 and the S2 foramina and used our navigated instrument to mark our entry point for our S2 alar screws.  Utilizing navigation, we then used the  pedicle probe to probe the sacrum to go across the SI joint into the ileum.  We then utilized a navigated 7.5 tap to tap across the SI joint to a depth of about 80 mm, and then we placed 90 mm 8.5 diameter S2 alar screws which were navigated.  My nurse practitioner was involved in the placement of the screws.  We then redraped and ran another spin with the aero to assure accurate placement of her instrumentation.  It all looked good.    We once again explored our fusion to assure pseudoarthrosis at L4-5 and felt there may be pseudoarthrosis at L2-3 and L3-4.  Then used the high-speed drill to decorticate each individual transverse process of L2, L3, L4, L5 and S1 bilaterally and laid a mixture of morcellized autograft and allograft out over and between the bilateral L2, L3, L4, L5 and S1 transverse processes to perform intertransverse arthrodesis at L2-S1 inclusive on the right and L3-S1 inclusive on the left.  Note that we drilled each transverse process individually and placed bone graft on top of and between each of these transverse processes successively to perform arthrodesis L2-S1 inclusive bilaterally. we then placed lordotic rods into the multiaxial screw heads of the pedicle screws from L1-S2 and locked these in position with the locking caps and anti-torque device.  Irrigated with copious amounts of bacitracin-containing saline solution.  We placed bilateral medium Hemovac drains and then we closed the muscle and the fascia with 0 Vicryl. Closed the subcutaneous tissues with 2-0 Vicryl and subcuticular tissues with 3-0 Vicryl. The skin was closed with staples. Dressing was then applied, the patient was awakened from general anesthesia and transported to the recovery room in stable condition. At the end of the procedure all sponge, needle and instrument counts were correct.   PLAN OF CARE: admit to inpatient  PATIENT DISPOSITION:  PACU - hemodynamically stable.   Delay start of Pharmacological VTE  agent (>24hrs) due to surgical blood loss or risk of bleeding:  yes

## 2020-11-30 NOTE — Anesthesia Postprocedure Evaluation (Signed)
Anesthesia Post Note  Patient: April Shaffer  Procedure(s) Performed: LUMBAR FOUR-FIVE, LUMBAR FIVE-SACRAL ONE POSTERIOR LATERAL FUSION WITH REVISION OF LUMBAR ONE-FIVE HARDWARE AND EXTENSION TO SACRAL ONE AND SACRAL TWO (Spine Lumbar) APPLICATION OF INTRAOPERATIVE CT SCAN     Patient location during evaluation: PACU Anesthesia Type: General Level of consciousness: awake Pain management: pain level controlled Vital Signs Assessment: post-procedure vital signs reviewed and stable Respiratory status: spontaneous breathing Cardiovascular status: stable Postop Assessment: no apparent nausea or vomiting Anesthetic complications: no   No notable events documented.  Last Vitals:  Vitals:   11/30/20 1930 11/30/20 1933  BP: 123/62 123/62  Pulse: 74 72  Resp: 11 12  Temp:    SpO2: 100% 100%    Last Pain:  Vitals:   11/30/20 1938  TempSrc:   PainSc: Asleep                 [name]

## 2020-11-30 NOTE — Anesthesia Procedure Notes (Addendum)
Arterial Line Insertion Start/End10/06/2020 11:05 AM, 11/30/2020 11:15 AM Performed by: Quentin Ore, CRNA, CRNA  Patient location: Pre-op. Preanesthetic checklist: patient identified, IV checked, site marked, risks and benefits discussed, surgical consent, monitors and equipment checked and pre-op evaluation Lidocaine 1% used for infiltration Left, radial was placed Catheter size: 20 G Hand hygiene performed , maximum sterile barriers used  and Seldinger technique used Allen's test indicative of satisfactory collateral circulation Attempts: 1 Procedure performed without using ultrasound guided technique. Following insertion, Biopatch and dressing applied. Patient tolerated the procedure well with no immediate complications.

## 2020-11-30 NOTE — Anesthesia Procedure Notes (Signed)
Procedure Name: Intubation Date/Time: 11/30/2020 1:52 PM Performed by: Thelma Comp, CRNA Pre-anesthesia Checklist: Patient identified, Emergency Drugs available, Suction available and Patient being monitored Patient Re-evaluated:Patient Re-evaluated prior to induction Oxygen Delivery Method: Circle System Utilized Preoxygenation: Pre-oxygenation with 100% oxygen Induction Type: IV induction Ventilation: Mask ventilation without difficulty Laryngoscope Size: Mac and 3 Grade View: Grade I Tube type: Oral Tube size: 7.0 mm Number of attempts: 1 Airway Equipment and Method: Stylet Placement Confirmation: ETT inserted through vocal cords under direct vision, positive ETCO2 and breath sounds checked- equal and bilateral Secured at: 22 cm Tube secured with: Tape Dental Injury: Teeth and Oropharynx as per pre-operative assessment

## 2020-11-30 NOTE — H&P (Signed)
Subjective: Patient is a 71 y.o. female admitted for pseudoarthrosis L4-5 with back pain with radiculopathy. Onset of symptoms was a few months ago, gradually worsening since that time.  The pain is rated moderate, and is located at the across the lower back and radiates to right lower extremity. The pain is described as aching and occurs intermittently. The symptoms have been progressive. Symptoms are exacerbated by exercise and standing. MRI or CT showed pseudoarthrosis L4 4:05 level lumbar fusion, with loosening of instrumentation at L5.  Past Medical History:  Diagnosis Date   Arthritis    Carotid artery narrowing    Coronary artery disease    Cardiac catheterization November 2013: 50% ostial LAD stenosis 50% mid stenosis. 30% disease in the left circumflex.   Diabetes mellitus, type 2 ([place])    Hyperlipidemia    Hypertension    Onychomycosis of toenail 07/31/2016   Sleep apnea       Had surgery to correct    Past Surgical History:  Procedure Laterality Date   ABDOMINAL HYSTERECTOMY     BACK SURGERY     CARDIAC CATHETERIZATION     CATARACT EXTRACTION, BILATERAL Bilateral 2021   Dr. Sherral Hammers   ENDARTERECTOMY  09/27/2011   Procedure: RIGT ENDARTERECTOMY CAROTID;  Surgeon: Mal Misty, MD;  Location: [facility];  Service: Vascular;  Laterality: Right;   EPIDURAL BLOCK INJECTION  02/2008   Drs. Eulis Manly   gyn surgery  2004   total hysterectomy for mennorhagia,,salpingoophorectomy   LUMBAR WOUND DEBRIDEMENT N/A 05/25/2020   Procedure: LUMBAR WOUND IRRIGATION AND DEBRIDEMENT;  Surgeon: Eustace Moore, MD;  Location: [facility];  Service: Neurosurgery;  Laterality: N/A;   POSTERIOR LUMBAR FUSION 4 LEVEL N/A 04/25/2020   Procedure: POSTERIOR LUMBAR INTERBODY FUSION LUMBAR ONE-TWO, LUMBAR TWO-THREE, LUMBAR THREE-FOUR,LUMBAR FOUR-FIVE.;  Surgeon: Eustace Moore, MD;  Location: [facility];  Service: Neurosurgery;  Laterality: N/A;  posterior   TONSILLECTOMY     TOTAL ABDOMINAL HYSTERECTOMY W/  BILATERAL SALPINGOOPHORECTOMY Bilateral 2000   TOTAL HIP ARTHROPLASTY Right 08/22/2018   Procedure: TOTAL HIP ARTHROPLASTY;  Surgeon: Earlie Server, MD;  Location: [facility];  Service: Orthopedics;  Laterality: Right;   VARICOSE VEIN SURGERY  2008   stripping    Prior to Admission medications   Medication Sig Start Date End Date Taking? Authorizing Provider  allopurinol (ZYLOPRIM) 300 MG tablet Take 1 tablet (300 mg total) by mouth daily. 07/09/19  Yes Mack Hook, MD  amLODipine (NORVASC) 10 MG tablet Take 10 mg by mouth daily.   Yes [provider]  aspirin EC 81 MG tablet Take 81 mg by mouth daily.   Yes [provider]  atorvastatin (LIPITOR) 80 MG tablet TAKE 1 TABLET BY MOUTH ONCE DAILY WITH  EVENING  MEAL Patient taking differently: Take 80 mg by mouth in the morning. 07/09/19  Yes Mack Hook, MD  carvedilol (COREG CR) 20 MG 24 hr capsule Take 1 capsule (20 mg total) by mouth daily. 07/09/19  Yes Mack Hook, MD  clopidogrel (PLAVIX) 75 MG tablet Take 75 mg by mouth daily.   Yes [provider]  ezetimibe (ZETIA) 10 MG tablet TAKE 1 TABLET EVERY DAY (NEED MD APPOINTMENT) 09/27/20  Yes Lorretta Harp, MD  gabapentin (NEURONTIN) 600 MG tablet Take 600 mg by mouth in the morning and at bedtime. 11/22/20  Yes [provider]  metFORMIN (GLUCOPHAGE) 500 MG tablet Take 500 mg by mouth 2 (two) times daily with a meal. 04/08/20  Yes [provider]  methocarbamol (ROBAXIN) 500 MG tablet Take 1 tablet (500 mg total) by mouth 4 (four) times daily. 04/27/20  Yes Meyran, Ocie Cornfield, NP  MITIGARE 0.6 MG CAPS TAKE 1 CAPSULE TWICE DAILY AS NEEDED FOR GOUT FLARE AND HOLD ATORVASTATIN ON DAYS TAKING MITIGARE 12/31/19  Yes Mack Hook, MD  oxyCODONE-acetaminophen (PERCOCET) 10-325 MG tablet Take 1 tablet by mouth every 6 (six) hours as needed for pain. 06/01/20  Yes Eustace Moore, MD  oxyCODONE-acetaminophen (PERCOCET/ROXICET) 5-325  MG tablet Take 1 tablet by mouth every 6 (six) hours as needed for pain. 11/21/20  Yes [provider]  amLODipine (NORVASC) 5 MG tablet Take 1 tablet (5 mg total) by mouth 2 (two) times daily. Patient not taking: No sig reported 06/01/20   Eustace Moore, MD  blood glucose meter kit and supplies KIT Check sugars twice daily 06/10/18   Mack Hook, MD  gabapentin (NEURONTIN) 300 MG capsule 1 cap by mouth in morning, 1 cap by mouth midday and 2 caps by mouth before bedtime. Patient not taking: Reported on 11/23/2020 06/10/19   Mack Hook, MD  glucose blood test strip Check sugars twice daily 01/31/16   Mack Hook, MD   Allergies  Allergen Reactions   Lisinopril-Hydrochlorothiazide Swelling    Angioedema  - Tongue swelling    Codeine Itching   Oxycodone Itching   Simvastatin Other (See Comments)    Muscle pain   Tramadol Itching    Social History   Tobacco Use   Smoking status: Every Day    Packs/day: 0.10    Years: 25.00    Pack years: 2.50    Types: Cigarettes    Start date: 05/31/1968   Smokeless tobacco: Never   Tobacco comments:    pt states she smokes about 4-5 cigs per day--states she contiues to work on it.  No interest in other help now.  Substance Use Topics   Alcohol use: Yes    Comment: rare    Family History  Problem Relation Age of Onset   Hypertension Sister    Hypothyroidism Sister    Cancer Maternal Grandmother        lung   Hypertension Son    Hypertension Sister    Diabetes Brother        lost toe in 2018 with new diagnosis of DM   Hypertension Son      Review of Systems  Positive ROS: neg  All other systems have been reviewed and were otherwise negative with the exception of those mentioned in the HPI and as above.  Objective: Vital signs in last 24 hours: Temp:  [98.5 F (36.9 C)] 98.5 F (36.9 C) (10/05 1101) Pulse Rate:  [81] 81 (10/05 1101) Resp:  [18] 18 (10/05 1101) BP: (147)/(67) 147/67 (10/05 1101) SpO2:   [100 %] 100 % (10/05 1101) Weight:  [61.2 kg] 61.2 kg (10/05 1101)  General Appearance: Alert, cooperative, no distress, appears stated age Head: Normocephalic, without obvious abnormality, atraumatic Eyes: PERRL, conjunctiva/corneas clear, EOM's intact    Neck: Supple, symmetrical, trachea midline Back: Symmetric, no curvature, ROM normal, no CVA tenderness Lungs:  respirations unlabored Heart: Regular rate and rhythm Abdomen: Soft, non-tender Extremities: Extremities normal, atraumatic, no cyanosis or edema Pulses: 2+ and symmetric all extremities Skin: Skin color, texture, turgor normal, no rashes or lesions  NEUROLOGIC:   Mental status: Alert and oriented x4,  no aphasia, good attention span, fund of knowledge, and memory Motor Exam - grossly normal Sensory Exam -  grossly normal Reflexes: 1+ Coordination - grossly normal Gait - grossly normal Balance - grossly normal Cranial Nerves: I: smell Not tested  II: visual acuity  OS: nl    OD: nl  II: visual fields Full to confrontation  II: pupils Equal, round, reactive to light  III,VII: ptosis None  III,IV,VI: extraocular muscles  Full ROM  V: mastication Normal  V: facial light touch sensation  Normal  V,VII: corneal reflex  Present  VII: facial muscle function - upper  Normal  VII: facial muscle function - lower Normal  VIII: hearing Not tested  IX: soft palate elevation  Normal  IX,X: gag reflex Present  XI: trapezius strength  5/5  XI: sternocleidomastoid strength 5/5  XI: neck flexion strength  5/5  XII: tongue strength  Normal    Data Review Lab Results  Component Value Date   WBC 9.2 11/28/2020   HGB 9.9 (L) 11/28/2020   HCT 33.5 (L) 11/28/2020   MCV 83.1 11/28/2020   PLT 456 (H) 11/28/2020   Lab Results  Component Value Date   NA 133 (L) 11/28/2020   K 4.2 11/28/2020   CL 103 11/28/2020   CO2 23 11/28/2020   BUN 9 11/28/2020   CREATININE 0.85 11/28/2020   GLUCOSE 133 (H) 11/28/2020   Lab Results   Component Value Date   INR 1.0 11/28/2020    Assessment/Plan:  Estimated body mass index is 23.91 kg/m as calculated from the following:   Height as of this encounter: _0  (1.6 m).   Weight as of this encounter: 61.2 kg. Patient admitted for exploration of fusion L1-L5 with removal of instrumentation L1-L5, removal of L5 pedicle screws and placement of S1 and S2 alar screws with posterior lateral fusion L4-5 L5-S1. Patient has failed a reasonable attempt at conservative therapy.  I explained the condition and procedure to the patient and answered any questions.  Patient wishes to proceed with procedure as planned. Understands risks/ benefits and typical outcomes of procedure.   Eustace Moore 11/30/2020 12:46 PM

## 2020-11-30 NOTE — Transfer of Care (Signed)
Immediate Anesthesia Transfer of Care Note  Patient: April Shaffer  Procedure(s) Performed: LUMBAR FOUR-FIVE, LUMBAR FIVE-SACRAL ONE POSTERIOR LATERAL FUSION WITH REVISION OF LUMBAR ONE-FIVE HARDWARE AND EXTENSION TO SACRAL ONE AND SACRAL TWO (Spine Lumbar) APPLICATION OF INTRAOPERATIVE CT SCAN  Patient Location: PACU  Anesthesia Type:General  Level of Consciousness: awake, alert  and patient cooperative  Airway & Oxygen Therapy: Patient Spontanous Breathing and Patient connected to face mask oxygen  Post-op Assessment: Report given to RN and Post -op Vital signs reviewed and stable  Post vital signs: Reviewed and stable  Last Vitals:  Vitals Value Taken Time  BP 108/61 11/30/20 1847  Temp    Pulse 79 11/30/20 1853  Resp 18 11/30/20 1853  SpO2 100 % 11/30/20 1853  Vitals shown include unvalidated device data.  Last Pain:  Vitals:   11/30/20 1122  TempSrc:   PainSc: 4       Patients Stated Pain Goal: 3 (11/30/20 1122)  Complications: No notable events documented.

## 2020-12-01 DIAGNOSIS — L0291 Cutaneous abscess, unspecified: Secondary | ICD-10-CM | POA: Diagnosis not present

## 2020-12-01 DIAGNOSIS — Z981 Arthrodesis status: Secondary | ICD-10-CM | POA: Diagnosis not present

## 2020-12-01 LAB — POCT I-STAT 7, (LYTES, BLD GAS, ICA,H+H)
Acid-Base Excess: 1 mmol/L (ref 0.0–2.0)
Bicarbonate: 26.1 mmol/L (ref 20.0–28.0)
Calcium, Ion: 1.19 mmol/L (ref 1.15–1.40)
HCT: 27 % — ABNORMAL LOW (ref 36.0–46.0)
Hemoglobin: 9.2 g/dL — ABNORMAL LOW (ref 12.0–15.0)
O2 Saturation: 100 %
Patient temperature: 33.9
Potassium: 3.5 mmol/L (ref 3.5–5.1)
Sodium: 139 mmol/L (ref 135–145)
TCO2: 27 mmol/L (ref 22–32)
pCO2 arterial: 36.9 mmHg (ref 32.0–48.0)
pH, Arterial: 7.444 (ref 7.350–7.450)
pO2, Arterial: 303 mmHg — ABNORMAL HIGH (ref 83.0–108.0)

## 2020-12-01 LAB — CBC
HCT: 23.8 % — ABNORMAL LOW (ref 36.0–46.0)
Hemoglobin: 7.4 g/dL — ABNORMAL LOW (ref 12.0–15.0)
MCH: 25.1 pg — ABNORMAL LOW (ref 26.0–34.0)
MCHC: 31.1 g/dL (ref 30.0–36.0)
MCV: 80.7 fL (ref 80.0–100.0)
Platelets: 330 10*3/uL (ref 150–400)
RBC: 2.95 MIL/uL — ABNORMAL LOW (ref 3.87–5.11)
RDW: 19.9 % — ABNORMAL HIGH (ref 11.5–15.5)
WBC: 10.5 10*3/uL (ref 4.0–10.5)
nRBC: 0 % (ref 0.0–0.2)

## 2020-12-01 LAB — GLUCOSE, CAPILLARY
Glucose-Capillary: 225 mg/dL — ABNORMAL HIGH (ref 70–99)
Glucose-Capillary: 168 mg/dL — ABNORMAL HIGH (ref 70–99)
Glucose-Capillary: 148 mg/dL — ABNORMAL HIGH (ref 70–99)
Glucose-Capillary: 180 mg/dL — ABNORMAL HIGH (ref 70–99)

## 2020-12-01 LAB — PREPARE RBC (CROSSMATCH)

## 2020-12-01 MED ORDER — SODIUM CHLORIDE 0.9% IV SOLUTION: Freq: Once | INTRAVENOUS | Status: AC

## 2020-12-01 MED ORDER — VANCOMYCIN HCL 1250 MG/250ML IV SOLN
1250.0000 mg | Freq: Once | INTRAVENOUS | Status: AC
  Administered 2020-12-01: 1250 mg via INTRAVENOUS
  Filled 2020-12-01: qty 250

## 2020-12-01 MED ORDER — VANCOMYCIN HCL 1500 MG/300ML IV SOLN
1500.0000 mg | Freq: Once | INTRAVENOUS | Status: DC
  Filled 2020-12-01: qty 300

## 2020-12-01 MED ORDER — FERROUS SULFATE 325 (65 FE) MG PO TABS
325.0000 mg | ORAL_TABLET | Freq: Every day | ORAL | Status: DC
  Administered 2020-12-01 – 2020-12-03 (×3): 325 mg via ORAL
  Filled 2020-12-01 (×3): qty 1

## 2020-12-01 MED ORDER — CHLORHEXIDINE GLUCONATE CLOTH 2 % EX PADS
6.0000 | MEDICATED_PAD | Freq: Every day | CUTANEOUS | Status: DC
  Administered 2020-12-01 – 2020-12-03 (×3): 6 via TOPICAL

## 2020-12-01 MED ORDER — VANCOMYCIN HCL IN DEXTROSE 1-5 GM/200ML-% IV SOLN
1000.0000 mg | INTRAVENOUS | Status: DC
  Administered 2020-12-02: 1000 mg via INTRAVENOUS
  Filled 2020-12-01: qty 200

## 2020-12-01 NOTE — Plan of Care (Signed)
  Problem: Education: Goal: Ability to verbalize activity precautions or restrictions will improve Outcome: Progressing   

## 2020-12-01 NOTE — Progress Notes (Signed)
Patient's boyfriend has her brace. Patient refused me to order another. She wants to wait until her boyfriend comes in the morning. Informed her she couldn't walk without her brace. She called her boyfriend to inform him she needed her brace early in the morning. He is expected to come between 7-7:30 am.

## 2020-12-01 NOTE — Evaluation (Signed)
Occupational Therapy Evaluation and Discharge Patient Details Name: April Shaffer MRN: [id number] DOB: Jan 13, 1951 Today's Date: 12/01/2020   History of Present Illness 70 y.o. female presents to [facility] hospital on 11/30/2020 due to back pain with radiculopathy. 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 Pt underwent instrumentation removal, placement of S1/S2 alar screws, replacement of pedicle screws L1-2 and L4 on R, and intertransvers arthrodesis L2-S1, on 11/30/2020. PMH includes OA, CAD, DMII, HTN, HLD.   Clinical Impression   This 70 yo female admitted and underwent above presents to acute OT with all education completed with pt, we will D/C from acute OT.      Recommendations for follow up therapy are one component of a multi-disciplinary discharge planning process, led by the attending physician.  Recommendations may be updated based on patient status, additional functional criteria and insurance authorization.   Follow Up Recommendations  No OT follow up;Supervision/Assistance - 24 hour    Equipment Recommendations  Other (comment) (rollator)       Precautions / Restrictions Precautions Precautions: Fall;Back Precaution Booklet Issued: Yes (comment) Precaution Comments: pt able to recall 3/3 back precautions without cues Required Braces or Orthoses: Spinal Brace Spinal Brace: Lumbar corset;Applied in sitting position Restrictions Weight Bearing Restrictions: No      Mobility Bed Mobility Overal bed mobility: Needs Assistance Bed Mobility: Sit to Sidelying     Sit to sidelying: Supervision     Transfers Overall transfer level: Needs assistance Equipment used: Rolling walker (2 wheeled) Transfers: Sit to/from Stand Sit to Stand: Supervision              Balance Overall balance assessment: Needs assistance Sitting-balance support: No upper extremity supported;Feet supported Sitting balance-Leahy Scale: Good     Standing balance  support: No upper extremity supported;During functional activity Standing balance-Leahy Scale: Fair Standing balance comment: standing at sink to wash hands and face                           ADL either performed or assessed with clinical judgement   ADL                                         General ADL Comments: Reports significant other will A her prn, but pt also educated on how to use sock aid and long handled sponge for LBB/D and she reports using her reacher for other parts of LBD at home. Educated on use of 2 cups for brushing teeth to avoid bending over this sink, to use wet wipes for back peri care to avoid twisting as much/long. Pt S for toileting transfers to comfort height toilet/grab bars and for front peri hygiene as well as standing at sink to wash face and hands.Educated on the bed sequence of dressing to protect back the most whether she is doing it all herself or is getting help.     Vision Patient Visual Report: No change from baseline                  Pertinent Vitals/Pain Pain Assessment: 0-10 Pain Score: 5  Pain Location: incisional Pain Descriptors / Indicators: Sore;Aching Pain Intervention(s): Limited activity within patient's tolerance;Monitored during session;Patient requesting pain meds-RN notified;RN gave pain meds during session     Hand Dominance Right   Extremity/Trunk Assessment Upper Extremity Assessment Upper Extremity Assessment: Overall [facility]  for tasks assessed      Communication Communication Communication: No difficulties   Cognition Arousal/Alertness: Awake/alert Behavior During Therapy: WFL for tasks assessed/performed Overall Cognitive Status: Within Functional Limits for tasks assessed                                                Home Living Family/patient expects to be discharged to:: Private residence Living Arrangements: Spouse/significant other Available Help at Discharge:  Available 24 hours/day Type of Home: House Home Access: Stairs to enter [employer] of Steps: 1 Entrance Stairs-Rails: None Home Layout: One level     Bathroom Shower/Tub: IT trainer: Standard     Home Equipment: Environmental consultant - 2 wheels;Bedside commode;Hand held shower head;Adaptive equipment;Cane - single point;Tub bench Adaptive Equipment: Reacher Additional Comments: Gave pt a sock aid and long handled sponge today      Prior Functioning/Environment Level of Independence: Independent with assistive device(s)        Comments: pt reports ambulating household distances without device, utilizing walker for community mobility        OT Problem List: Decreased range of motion;Impaired balance (sitting and/or standing);Pain         OT Goals(Current goals can be found in the care plan section) Acute Rehab OT Goals Patient Stated Goal: to hopefully go home tomorrow   AM-PAC OT "6 Clicks" Daily Activity     Outcome Measure Help from another person eating meals?: None Help from another person taking care of personal grooming?: A Little Help from another person toileting, which includes using toliet, bedpan, or urinal?: A Little Help from another person bathing (including washing, rinsing, drying)?: A Little Help from another person to put on and taking off regular upper body clothing?: A Little Help from another person to put on and taking off regular lower body clothing?: A Little 6 Click Score: 19   End of Session Equipment Utilized During Treatment: Rolling walker Nurse Communication:  (pt requests rollator and [facility] for home health PT)  Activity Tolerance: Patient tolerated treatment well Patient left: in bed;with call bell/phone within reach;with family/visitor present  OT Visit Diagnosis: Unsteadiness on feet (R26.81);Muscle weakness (generalized) (M62.81);Pain Pain - part of body:  (incisional)                Time: 5885-0277 OT  Time Calculation (min): 29 min Charges:  OT General Charges $OT Visit: 1 Visit OT Evaluation $OT Eval Moderate Complexity: 1 Mod OT Treatments $Self Care/Home Management : 8-22 mins Ignacia Palma, OTR/L Acute [employer] Pager [phone] Office [phone]    April Shaffer 12/01/2020, 9:36 AM

## 2020-12-01 NOTE — Evaluation (Signed)
Physical Therapy Evaluation Patient Details Name: April Shaffer MRN: [id number] DOB: 01-26-1951 Today's Date: 12/01/2020  History of Present Illness  70 y.o. female presents to [facility] hospital on 11/30/2020 due to back pain with radiculopathy. 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 Pt underwent instrumentation removal, placement of S1/S2 alar screws, replacement of pedicle screws L1-2 and L4 on R, and intertransvers arthrodesis L2-S1, on 11/30/2020. PMH includes OA, CAD, DMII, HTN, HLD.  Clinical Impression  Pt presents to PT with deficits in power, endurance, gait, balance, and adherence to back precautions. Pt requires cues for log roll when performing bed mobility, will benefit from trouble shooting bed mobility as pt has long bed rail at home she must mobilize around. Pt is able to ambulate and transfer without physical assistance, will benefit from continued acute PT services to progress to ambulation without the use of an assistive device. PT recommends discharge home with HHPT when medically ready.     Recommendations for follow up therapy are one component of a multi-disciplinary discharge planning process, led by the attending physician.  Recommendations may be updated based on patient status, additional functional criteria and insurance authorization.  Follow Up Recommendations Home health PT    Equipment Recommendations  None recommended by PT    Recommendations for Other Services       Precautions / Restrictions Precautions Precautions: Fall;Back Precaution Booklet Issued: Yes (comment) Precaution Comments: pt able to recall 2/3 precautions without cues and able to recall twisting with cues for letter T Required Braces or Orthoses: Spinal Brace Spinal Brace: Lumbar corset;Applied in sitting position (adjusted in standing) Restrictions Weight Bearing Restrictions: No      Mobility  Bed Mobility Overal bed mobility: Needs Assistance Bed Mobility:  Rolling;Sidelying to Sit;Sit to Supine Rolling: Supervision Sidelying to sit: Supervision   Sit to supine: Supervision   General bed mobility comments: cues for bending knees to log roll and reduce twisting    Transfers Overall transfer level: Needs assistance Equipment used: Rolling walker (2 wheeled) Transfers: Sit to/from Stand Sit to Stand: Supervision            Ambulation/Gait Ambulation/Gait assistance: Supervision Gait Distance (Feet): 300 Feet Assistive device: Rolling walker (2 wheeled) Gait Pattern/deviations: Step-through pattern Gait velocity: functional Gait velocity interpretation: 1.31 - 2.62 ft/sec, indicative of limited community ambulator General Gait Details: pt with steady step-through gait, mild increase in trunk flexion, PT providing taller walker at end of session to improve posture with future ambulation  Stairs            Wheelchair Mobility    Modified Rankin (Stroke Patients Only)       Balance Overall balance assessment: Needs assistance Sitting-balance support: No upper extremity supported;Feet supported Sitting balance-Leahy Scale: Good     Standing balance support: Single extremity supported;Bilateral upper extremity supported Standing balance-Leahy Scale: Poor Standing balance comment: benefits from UE support of walker                             Pertinent Vitals/Pain Pain Assessment: No/denies pain    Home Living Family/patient expects to be discharged to:: Private residence Living Arrangements: Spouse/significant other Available Help at Discharge: Available 24 hours/day Type of Home: House Home Access: Stairs to enter Entrance Stairs-Rails: None Entrance Stairs-Number of Steps: 1 Home Layout: One level Home Equipment: Environmental consultant - 2 wheels;Bedside commode;Shower seat;Hand held shower head;Adaptive equipment;Cane - single point      Prior  Function Level of Independence: Independent with assistive device(s)          Comments: pt reports ambulating household distances without device, utilizing walker for community mobility     Hand Dominance        Extremity/Trunk Assessment   Upper Extremity Assessment Upper Extremity Assessment: Overall WFL for tasks assessed    Lower Extremity Assessment Lower Extremity Assessment: Overall WFL for tasks assessed    Cervical / Trunk Assessment Cervical / Trunk Assessment: Other exceptions Cervical / Trunk Exceptions: s/p lumbar surgery  Communication   Communication: No difficulties  Cognition Arousal/Alertness: Awake/alert Behavior During Therapy: WFL for tasks assessed/performed Overall Cognitive Status: Within Functional Limits for tasks assessed                                        General Comments General comments (skin integrity, edema, etc.): VSS on RA    Exercises     Assessment/Plan    PT Assessment Patient needs continued PT services  PT Problem List Decreased activity tolerance;Decreased balance;Decreased mobility;Decreased knowledge of precautions       PT Treatment Interventions DME instruction;Gait training;Stair training;Functional mobility training;Therapeutic activities;Therapeutic exercise;Balance training;Patient/family education    PT Goals (Current goals can be found in the Care Plan section)  Acute Rehab PT Goals Patient Stated Goal: to go home, return to independence PT Goal Formulation: With patient Time For Goal Achievement: 12/15/20 Potential to Achieve Goals: Good    Frequency Min 5X/week   Barriers to discharge        Co-evaluation               AM-PAC PT "6 Clicks" Mobility  Outcome Measure Help needed turning from your back to your side while in a flat bed without using bedrails?: A Little Help needed moving from lying on your back to sitting on the side of a flat bed without using bedrails?: A Little Help needed moving to and from a bed to a chair (including a  wheelchair)?: A Little Help needed standing up from a chair using your arms (e.g., wheelchair or bedside chair)?: A Little Help needed to walk in hospital room?: A Little Help needed climbing 3-5 steps with a railing? : A Little 6 Click Score: 18    End of Session Equipment Utilized During Treatment: Back brace Activity Tolerance: Patient tolerated treatment well Patient left: with call bell/phone within reach;in bed;with family/visitor present Nurse Communication: Mobility status PT Visit Diagnosis: Other abnormalities of gait and mobility (R26.89)    Time: 1610-9604 PT Time Calculation (min) (ACUTE ONLY): 30 min   Charges:   PT Evaluation $PT Eval Low Complexity: 1 Low          Arlyss Gandy, PT, DPT Acute Rehabilitation Pager: [phone]   Arlyss Gandy 12/01/2020, 8:47 AM

## 2020-12-01 NOTE — Progress Notes (Signed)
Subjective: Patient reports no pain right now but hasnt been out of bed yet.   Objective: Vital signs in last 24 hours: Temp:  [97 F (36.1 C)-98.5 F (36.9 C)] 97.9 F (36.6 C) (10/06 0729) Pulse Rate:  [72-90] 90 (10/06 0729) Resp:  [11-19] 16 (10/06 0729) BP: (108-148)/(61-91) 120/91 (10/06 0729) SpO2:  [97 %-100 %] 100 % (10/06 0729) Arterial Line BP: (117-131)/(56-59) 125/59 (10/05 1917) Weight:  [61.2 kg] 61.2 kg (10/05 1101)  Intake/Output from previous day: 10/05 0701 - 10/06 0700 In: 1200 [I.V.:1200] Out: 1435 [Urine:725; Drains:210; Blood:500] Intake/Output this shift: No intake/output data recorded.  Neurologic: Grossly normal  Lab Results: Lab Results  Component Value Date   WBC 10.5 12/01/2020   HGB 7.4 (L) 12/01/2020   HCT 23.8 (L) 12/01/2020   MCV 80.7 12/01/2020   PLT 330 12/01/2020   Lab Results  Component Value Date   INR 1.0 11/28/2020   BMET Lab Results  Component Value Date   NA 139 11/30/2020   K 3.5 11/30/2020   CL 103 11/28/2020   CO2 23 11/28/2020   GLUCOSE 133 (H) 11/28/2020   BUN 9 11/28/2020   CREATININE 0.85 11/28/2020   CALCIUM 8.8 (L) 11/28/2020    Studies/Results: CT OArm Limited Study-No Report  Result Date: 11/30/2020 Fluoroscopy was utilized by the requesting physician.  No radiographic interpretation.    Assessment/Plan: Postop day 1 extension of fusion down to S2. Doing well so far. We will have her work with therapy today and see how she does. HGB 7.4 this morning. Will give her two units of prbc and start her on iron.    LOS: 1 day    April Shaffer 12/01/2020, 8:02 AM

## 2020-12-01 NOTE — Progress Notes (Signed)
Pt placed on contact precautions per ID. Will monitor Pt. Rema Fendt, RN

## 2020-12-01 NOTE — Consult Note (Signed)
[facility]       Reason for Consult:abscess    Referring Physician: Dr. Yetta Barre  Active Problems:   S/P lumbar fusion    allopurinol  300 mg Oral Daily   amLODipine  10 mg Oral Daily   aspirin EC  81 mg Oral Daily   carvedilol  20 mg Oral Daily   ezetimibe  10 mg Oral Daily   ferrous sulfate  325 mg Oral Q breakfast   gabapentin  600 mg Oral QHS   insulin aspart  0-15 Units Subcutaneous TID WC   ketorolac  7.5 mg Intravenous Q6H   metFORMIN  500 mg Oral BID WC   methocarbamol  500 mg Oral QID   senna  1 tablet Oral BID   sodium chloride flush  3 mL Intravenous Q12H    Recommendations: Vancomycin while inpatient  Bactrim at discharge (doxycycline resistant), 1 DS tab twice a day for 4 weeks  Will monitor cultures to see if the GNR grows out (I can monitor it as an outpatient after she leaves) and add or change antibiotics as indicated  Assessment: She has a pus pocket near a screw and now debrided.  Did not seem to be causing any issues but I feel it is prudent to treat.  Likely the same MRSA which was resistant to doxycycline so recommend Bactrim.  I will have her follow up with me in 1-2 weeks to monitor her creat.     Antibiotics: vancomycin  HPI: April Shaffer is a 70 y.o. 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 She was treated initially with daptomycin for ease of dosing but had an elevated CK so switched to vancomycin and was also on rifampin.  She did have significant nausea on rifampin.  She completed therapy and was doing well and brought in by Dr. Yetta Barre for worsening back pain with radiculopathy for lumbar reexploration with removal of instrumentation, replacement of pedicle screws L1-2 and L4, placement of alar screws and noted a pus pocket around on of the screws.  This was debrided and sent for culture  with gram stain positive for GPC and GNR. No fever, no chills.  WBC 10.5.     Review of Systems:  Constitutional: negative for fevers and chills Gastrointestinal: negative for nausea and diarrhea Integument/breast: negative for rash All other systems reviewed and are negative    Past Medical History:  Diagnosis Date   Arthritis    Carotid artery narrowing    Coronary artery disease    Cardiac catheterization November 2013: 50% ostial LAD stenosis 50% mid stenosis. 30% disease in the left circumflex.   Diabetes mellitus, type 2 (HCC)    Hyperlipidemia    Hypertension    Onychomycosis of toenail 07/31/2016   Sleep apnea       Had surgery to correct    Social History   Tobacco Use   Smoking status: Every Day    Packs/day: 0.10    Years: 25.00    Pack years: 2.50    Types: Cigarettes    Start date: 05/31/1968   Smokeless tobacco: Never   Tobacco comments:    pt states she smokes about 4-5 cigs per day--states she contiues to work on it.  No interest in other help now.  Vaping Use   Vaping Use: Never used  Substance Use Topics  Alcohol use: Yes    Comment: rare   Drug use: No    Family History  Problem Relation Age of Onset   Hypertension Sister    Hypothyroidism Sister    Cancer Maternal Grandmother        lung   Hypertension Son    Hypertension Sister    Diabetes Brother        lost toe in 2018 with new diagnosis of DM   Hypertension Son     Allergies  Allergen Reactions   Lisinopril-Hydrochlorothiazide Swelling    Angioedema  - Tongue swelling    Codeine Itching   Oxycodone Itching   Simvastatin Other (See Comments)    Muscle pain   Tramadol Itching    Physical Exam: Constitutional: in no apparent distress  Vitals:   12/01/20 0932 12/01/20 1006  BP: (!) 112/59 (!) 112/57  Pulse: 89 82  Resp: 16 20  Temp: 98 F (36.7 C) 97.6 F (36.4 C)  SpO2: 100% 100%   EYES: anicteric ENMT: no thrush Cardiovascular: Cor RRR Respiratory: normal  respiratory effort GI: Bowel sounds are normal, liver is not enlarged, spleen is not enlarged Musculoskeletal: no pedal edema noted Skin: negatives: no rash  Lab Results  Component Value Date   WBC 10.5 12/01/2020   HGB 7.4 (L) 12/01/2020   HCT 23.8 (L) 12/01/2020   MCV 80.7 12/01/2020   PLT 330 12/01/2020    Lab Results  Component Value Date   CREATININE 0.85 11/28/2020   BUN 9 11/28/2020   NA 139 11/30/2020   K 3.5 11/30/2020   CL 103 11/28/2020   CO2 23 11/28/2020    Lab Results  Component Value Date   ALT 14 06/01/2020   AST 25 06/01/2020   ALKPHOS 116 06/01/2020     Microbiology: Recent Results (from the past 240 hour(s))  Surgical pcr screen     Status: None   Collection Time: 11/28/20 10:33 AM   Specimen: Nasal Mucosa; Nasal Swab  Result Value Ref Range Status   MRSA, PCR NEGATIVE NEGATIVE Final   Staphylococcus aureus NEGATIVE NEGATIVE Final    Comment: (NOTE) The Xpert SA Assay (FDA approved for NASAL specimens in patients 64 years of age and older), is one component of a comprehensive surveillance program. It is not intended to diagnose infection nor to guide or monitor treatment. Performed at [facility], [address], [place], [place] [address]   SARS CORONAVIRUS 2 (TAT 6-24 HRS) Nasopharyngeal Nasopharyngeal Swab     Status: None   Collection Time: 11/28/20 10:33 AM   Specimen: Nasopharyngeal Swab  Result Value Ref Range Status   SARS Coronavirus 2 NEGATIVE NEGATIVE Final    Comment: (NOTE) SARS-CoV-2 target nucleic acids are NOT DETECTED.  The SARS-CoV-2 RNA is generally detectable in upper and lower respiratory specimens during the acute phase of infection. Negative results do not preclude SARS-CoV-2 infection, do not rule out co-infections with other pathogens, and should not be used as the sole basis for treatment or other patient management decisions. Negative results must be combined with clinical observations, patient history,  and epidemiological information. The expected result is Negative.  Fact Sheet for Patients: HairSlick.no  Fact Sheet for Healthcare Providers: quierodirigir.com  This test is not yet approved or cleared by the [place] FDA and  has been authorized for detection and/or diagnosis of SARS-CoV-2 by FDA under an Emergency Use Authorization (EUA). This EUA will remain  in effect (meaning this test can be used)  for the duration of the COVID-19 declaration under Se ction 564(b)(1) of the Act, 21 U.S.C. section 360bbb-3(b)(1), unless the authorization is terminated or revoked sooner.  Performed at [facility], [address], [place], [place] [address]   Aerobic/Anaerobic Culture w Gram Stain (surgical/deep wound)     Status: None (Preliminary result)   Collection Time: 11/30/20  3:00 PM   Specimen: Abscess; Wound  Result Value Ref Range Status   Specimen Description ABSCESS  Final   Special Requests LUMBAR SWABS  Final   Gram Stain   Final    NO ORGANISMS SEEN SQUAMOUS EPITHELIAL CELLS PRESENT MODERATE WBC PRESENT, PREDOMINANTLY MONONUCLEAR FEW GRAM POSITIVE COCCI RARE GRAM NEGATIVE RODS Performed at [facility], [address], [place], [place] [address]    Culture PENDING  Incomplete   Report Status PENDING  Incomplete    Gardiner Barefoot, MD [facility] [email or web address] 12/01/2020, 10:08 AM

## 2020-12-01 NOTE — Progress Notes (Signed)
Pharmacy Antibiotic Note  April Shaffer is a 70 y.o. female admitted on 11/30/2020 with pus pocket near lumbar hardware, previous cultures with MRSA in March 2022.  Pharmacy has been consulted for Vancomycin dosing.  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  Presenting with worsening back pain, now s/p replacement of pedicle screws L1-2 and L4, placement of alar screws and noted a pus pocket around on of the screws.   Plan: Start Vancomycin 1250 mg x1 dose today, followed by 1000 mg IV q24h (eAUC 474, Scr 0.85) Goal AUC 400-550 Monitor renal function, micro data, and clinical status  Height: 5\' 3"  (160 cm) Weight: 61.2 kg (135 lb) IBW/kg (Calculated) : 52.4  Temp (24hrs), Avg:97.7 F (36.5 C), Min:97 F (36.1 C), Max:98.5 F (36.9 C)  Recent Labs  Lab 11/28/20 1033 12/01/20 0247  WBC 9.2 10.5  CREATININE 0.85  --     Estimated Creatinine Clearance: 52.4 mL/min (by C-G formula based on SCr of 0.85 mg/dL).    Allergies  Allergen Reactions   Lisinopril-Hydrochlorothiazide Swelling    Angioedema  - Tongue swelling    Codeine Itching   Oxycodone Itching   Simvastatin Other (See Comments)    Muscle pain   Tramadol Itching    Antimicrobials this admission: Vanc  1g x1 Pre-op on 10/5 Cefazolin 10/5 >> 10/6  Dose adjustments this admission: None  Microbiology results: 10/5 AbscessCx: Few GPC, GNR   Thank you for allowing pharmacy to be a part of this patient's care.  01/31/21, PharmD PGY2 Infectious Diseases Pharmacy Resident   Please check [email or web address] for unit-specific pharmacy phone numbers

## 2020-12-02 ENCOUNTER — Encounter (HOSPITAL_COMMUNITY): Payer: Self-pay | Admitting: Neurological Surgery

## 2020-12-02 ENCOUNTER — Inpatient Hospital Stay (HOSPITAL_COMMUNITY): Payer: Medicare HMO

## 2020-12-02 LAB — TYPE AND SCREEN
ABO/RH(D): O POS
Antibody Screen: NEGATIVE
Unit division: 0
Unit division: 0

## 2020-12-02 LAB — GLUCOSE, CAPILLARY
Glucose-Capillary: 94 mg/dL (ref 70–99)
Glucose-Capillary: 132 mg/dL — ABNORMAL HIGH (ref 70–99)
Glucose-Capillary: 123 mg/dL — ABNORMAL HIGH (ref 70–99)
Glucose-Capillary: 149 mg/dL — ABNORMAL HIGH (ref 70–99)

## 2020-12-02 LAB — BASIC METABOLIC PANEL
Anion gap: 8 (ref 5–15)
BUN: 12 mg/dL (ref 8–23)
CO2: 23 mmol/L (ref 22–32)
Calcium: 8 mg/dL — ABNORMAL LOW (ref 8.9–10.3)
Chloride: 102 mmol/L (ref 98–111)
Creatinine, Ser: 0.88 mg/dL (ref 0.44–1.00)
GFR, Estimated: >60 mL/min (ref >60)
Glucose, Bld: 153 mg/dL — ABNORMAL HIGH (ref 70–99)
Potassium: 3.6 mmol/L (ref 3.5–5.1)
Sodium: 133 mmol/L — ABNORMAL LOW (ref 135–145)

## 2020-12-02 LAB — BPAM RBC
Blood Product Expiration Date: 202210122359
Blood Product Expiration Date: 202210122359
ISSUE DATE / TIME: 202210060942
ISSUE DATE / TIME: 202210061608
Unit Type and Rh: 5100
Unit Type and Rh: 5100

## 2020-12-02 LAB — HEMOGLOBIN AND HEMATOCRIT, BLOOD
HCT: 33.3 % — ABNORMAL LOW (ref 36.0–46.0)
Hemoglobin: 10.9 g/dL — ABNORMAL LOW (ref 12.0–15.0)

## 2020-12-02 MED ORDER — MUPIROCIN 2 % EX OINT: 1.0000 "application " | TOPICAL_OINTMENT | Freq: Two times a day (BID) | CUTANEOUS | Status: DC

## 2020-12-02 MED ORDER — DIPHENHYDRAMINE HCL 25 MG PO CAPS
25.0000 mg | ORAL_CAPSULE | Freq: Four times a day (QID) | ORAL | Status: DC | PRN
  Administered 2020-12-02: 25 mg via ORAL
  Filled 2020-12-02: qty 2

## 2020-12-02 MED ORDER — CHLORHEXIDINE GLUCONATE CLOTH 2 % EX PADS: 6.0000 | MEDICATED_PAD | Freq: Every day | CUTANEOUS | Status: DC

## 2020-12-02 NOTE — TOC Progression Note (Signed)
Transition of Care [facility]) - Progression Note    Patient Details  Name: April Shaffer MRN: [id number] Date of Birth: [birth year]  [facility] CM/SW Contact  Beckie Busing, RN Phone Number:[phone]  12/02/2020, 12:45 PM  Clinical Narrative:    Patient has recommendation for Home Heath PT. CM at bedside to offer choice. HH has been initiated with [facility]. Patient currently not ready for discharge but [facility] will cover for [facility] needs once the patient is medically ready for discharge.         Expected Discharge Plan and Services                                                 Social Determinants of Health (SDOH) Interventions    Readmission Risk Interventions No flowsheet data found.

## 2020-12-02 NOTE — Progress Notes (Signed)
Subjective: Patient reports some back soreness, no leg pain or NTW  Objective: Vital signs in last 24 hours: Temp:  [97.3 F (36.3 C)-97.9 F (36.6 C)] 97.9 F (36.6 C) (10/07 0717) Pulse Rate:  [55-80] 68 (10/07 0717) Resp:  [16-20] 20 (10/07 0717) BP: (90-120)/(42-61) 101/53 (10/07 0717) SpO2:  [98 %-100 %] 98 % (10/07 0717)  Intake/Output from previous day: 10/06 0701 - 10/07 0700 In: 750 [P.O.:120; Blood:630] Out: 161 [Urine:1; Drains:160] Intake/Output this shift: No intake/output data recorded.  Neurologic: Grossly normal  Lab Results: Lab Results  Component Value Date   WBC 10.5 12/01/2020   HGB 10.9 (L) 12/01/2020   HCT 33.3 (L) 12/01/2020   MCV 80.7 12/01/2020   PLT 330 12/01/2020   Lab Results  Component Value Date   INR 1.0 11/28/2020   BMET Lab Results  Component Value Date   NA 133 (L) 12/01/2020   K 3.6 12/01/2020   CL 102 12/01/2020   CO2 23 12/01/2020   GLUCOSE 153 (H) 12/01/2020   BUN 12 12/01/2020   CREATININE 0.88 12/01/2020   CALCIUM 8.0 (L) 12/01/2020    Studies/Results: CT OArm Limited Study-No Report  Result Date: 11/30/2020 Fluoroscopy was utilized by the requesting physician.  No radiographic interpretation.    Assessment/Plan: Doing well Abx per ID, appreciate his input, on vanco, plan bactrim on d/c Pt-ot Watch hbg Xrays look good  Estimated body mass index is 23.91 kg/m as calculated from the following:   Height as of this encounter: 5\' 3"  (1.6 m).   Weight as of this encounter: 61.2 kg.    LOS: 2 days    12/02/2020, 10:10 AM    '

## 2020-12-02 NOTE — Progress Notes (Signed)
Physical Therapy Treatment Patient Details Name: April Shaffer MRN: [id number] DOB: Jul 14, 1950 Today's Date: 12/02/2020   History of Present Illness 70 y.o. female presents to [facility] hospital on 11/30/2020 due to back pain with radiculopathy. 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 Pt underwent instrumentation removal, placement of S1/S2 alar screws, replacement of pedicle screws L1-2 and L4 on R, and intertransvers arthrodesis L2-S1, on 11/30/2020. PMH includes OA, CAD, DMII, HTN, HLD.    PT Comments    Pt tolerates treatment well, continuing to ambulate for limited community distances with use of walker. Pt is able to verbalize back precautions at this time, benefiting from verbal cues to perform log roll technique. Pt demonstrates the ability to negotiate stairs without physical assistance. PT encourages pt to continue mobilizing with assistance of nursing staff.  Recommendations for follow up therapy are one component of a multi-disciplinary discharge planning process, led by the attending physician.  Recommendations may be updated based on patient status, additional functional criteria and insurance authorization.  Follow Up Recommendations  Home health PT     Equipment Recommendations  None recommended by PT    Recommendations for Other Services       Precautions / Restrictions Precautions Precautions: Fall;Back Precaution Booklet Issued: Yes (comment) Precaution Comments: pt able to verbalize back precautions Required Braces or Orthoses: Spinal Brace Spinal Brace: Lumbar corset;Applied in sitting position Restrictions Weight Bearing Restrictions: No     Mobility  Bed Mobility Overal bed mobility: Needs Assistance Bed Mobility: Rolling;Sidelying to Sit;Sit to Sidelying Rolling: Supervision Sidelying to sit: Supervision     Sit to sidelying: Supervision General bed mobility comments: pt requires verbal cues to perform log roll rather than  transitioning from supine to sit    Transfers Overall transfer level: Modified independent Equipment used: Rolling walker (2 wheeled) Transfers: Sit to/from Stand Sit to Stand: Modified independent (Device/Increase time)            Ambulation/Gait Ambulation/Gait assistance: Supervision Gait Distance (Feet): 250 Feet Assistive device: Rolling walker (2 wheeled) Gait Pattern/deviations: Step-through pattern Gait velocity: functional Gait velocity interpretation: 1.31 - 2.62 ft/sec, indicative of limited community ambulator General Gait Details: pt with steady step-through gait, no significant balance deviations observed   Stairs Stairs: Yes Stairs assistance: Supervision Stair Management: Two rails;Step to pattern Number of Stairs: 4     Wheelchair Mobility    Modified Rankin (Stroke Patients Only)       Balance Overall balance assessment: Needs assistance Sitting-balance support: No upper extremity supported;Feet supported Sitting balance-Leahy Scale: Good     Standing balance support: No upper extremity supported Standing balance-Leahy Scale: Good                              Cognition Arousal/Alertness: Awake/alert Behavior During Therapy: WFL for tasks assessed/performed Overall Cognitive Status: Within Functional Limits for tasks assessed                                 General Comments: pt benefits from verbal cues to perform log roll when mobilizing in bed      Exercises      General Comments General comments (skin integrity, edema, etc.): VSS on RA      Pertinent Vitals/Pain Pain Assessment: Faces Faces Pain Scale: Hurts a little bit Pain Location: back Pain Descriptors / Indicators: Sore Pain Intervention(s): Monitored during session  Home Living                      Prior Function            PT Goals (current goals can now be found in the care plan section) Acute Rehab PT Goals Patient Stated  Goal: to go home Progress towards PT goals: Progressing toward goals    Frequency    Min 5X/week      PT Plan Current plan remains appropriate    Co-evaluation              AM-PAC PT "6 Clicks" Mobility   Outcome Measure  Help needed turning from your back to your side while in a flat bed without using bedrails?: A Little Help needed moving from lying on your back to sitting on the side of a flat bed without using bedrails?: A Little Help needed moving to and from a bed to a chair (including a wheelchair)?: None Help needed standing up from a chair using your arms (e.g., wheelchair or bedside chair)?: None Help needed to walk in hospital room?: A Little Help needed climbing 3-5 steps with a railing? : A Little 6 Click Score: 20    End of Session Equipment Utilized During Treatment: Back brace Activity Tolerance: Patient tolerated treatment well Patient left: in bed;with call bell/phone within reach Nurse Communication: Mobility status PT Visit Diagnosis: Other abnormalities of gait and mobility (R26.89)     Time: 7846-9629 PT Time Calculation (min) (ACUTE ONLY): 21 min  Charges:  $Gait Training: 8-22 mins                     Arlyss Gandy, PT, DPT Acute Rehabilitation Pager: [phone]    Arlyss Gandy 12/02/2020, 8:33 AM

## 2020-12-03 LAB — GLUCOSE, CAPILLARY: Glucose-Capillary: 97 mg/dL (ref 70–99)

## 2020-12-03 MED ORDER — SULFAMETHOXAZOLE-TRIMETHOPRIM 800-160 MG PO TABS: 1.0000 | ORAL_TABLET | Freq: Two times a day (BID) | ORAL | 1 refills | Status: DC

## 2020-12-03 MED ORDER — OXYCODONE-ACETAMINOPHEN 10-325 MG PO TABS: 1.0000 | ORAL_TABLET | ORAL | 0 refills | Status: AC | PRN

## 2020-12-03 NOTE — TOC Transition Note (Signed)
Transition of Care [facility]) - CM/SW Discharge Note   Patient Details  Name: April Shaffer MRN: [id number] Date of Birth: 08-Jul-1950  Transition of [facility]) CM/SW Contact:  Lawerance Sabal, RN Phone Number: 12/03/2020, 9:48 AM   Clinical Narrative:    Unit staff to provide DME. Notified [employer] of DC today. No other CM needs identified.           Patient Goals and CMS Choice        Discharge Placement                       Discharge Plan and Services                                     Social Determinants of Health (SDOH) Interventions     Readmission Risk Interventions No flowsheet data found.

## 2020-12-03 NOTE — Progress Notes (Signed)
Physical Therapy Treatment Patient Details Name: April Shaffer MRN: [id number] DOB: [birth year] Today's Date: 12/03/2020   History of Present Illness 70 y.o. female presents to [facility] hospital on 11/30/2020 due to back pain with radiculopathy. 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 Pt underwent instrumentation removal, placement of S1/S2 alar screws, replacement of pedicle screws L1-2 and L4 on R, and intertransvers arthrodesis L2-S1, on 11/30/2020. PMH includes OA, CAD, DMII, HTN, HLD.    PT Comments    The pt was able to demo good progress with hallway mobility both with and without need for AD. She was able to demo good stability with changes in speed and without UE support, but remains challenged by maintaining precautions with mobility. Continues to require light minA to manage bed mobility with precautions, but reports no significant changes in pain at this time. Continues to be safe to return home with family, all education completed.     Recommendations for follow up therapy are one component of a multi-disciplinary discharge planning process, led by the attending physician.  Recommendations may be updated based on patient status, additional functional criteria and insurance authorization.  Follow Up Recommendations  Home health PT     Equipment Recommendations  None recommended by PT    Recommendations for Other Services       Precautions / Restrictions Precautions Precautions: Fall;Back Precaution Booklet Issued: Yes (comment) Precaution Comments: pt able to verbalize, but breaks precautions consistently with movement without cues Required Braces or Orthoses: Spinal Brace Spinal Brace: Lumbar corset;Applied in sitting position Restrictions Weight Bearing Restrictions: No     Mobility  Bed Mobility Overal bed mobility: Needs Assistance Bed Mobility: Rolling;Sit to Sidelying Rolling: Supervision       Sit to sidelying: Supervision General  bed mobility comments: verbal cues to maintain precautions, but frequently breaking without cues    Transfers Overall transfer level: Modified independent Equipment used: Rolling walker (2 wheeled) Transfers: Sit to/from Stand Sit to Stand: Modified independent (Device/Increase time)         General transfer comment: pt completing without assist, stable with initial stand  Ambulation/Gait Ambulation/Gait assistance: Supervision Gait Distance (Feet): 300 Feet Assistive device: Rolling walker (2 wheeled);None Gait Pattern/deviations: Step-through pattern Gait velocity: 0.2 m/s but pt able to increase speed with cues Gait velocity interpretation: <1.31 ft/sec, indicative of household ambulator General Gait Details: pt with trunk flexion, cues for posture and positioning in RW, no instability. pt also able to ambulate 30 ft without ad, improved posture, good stability       Balance Overall balance assessment: Needs assistance Sitting-balance support: No upper extremity supported;Feet supported Sitting balance-Leahy Scale: Good     Standing balance support: No upper extremity supported Standing balance-Leahy Scale: Good Standing balance comment: able to ambulate without AD                            Cognition Arousal/Alertness: Awake/alert Behavior During Therapy: WFL for tasks assessed/performed Overall Cognitive Status: Within Functional Limits for tasks assessed                                 General Comments: pt benefits from verbal cues to perform log roll when mobilizing in bed      Exercises      General Comments General comments (skin integrity, edema, etc.): VSS on RA      Pertinent Vitals/Pain  Pain Assessment: Faces Faces Pain Scale: Hurts a little bit Pain Location: back Pain Descriptors / Indicators: Sore Pain Intervention(s): Limited activity within patient's tolerance;Monitored during session     PT Goals (current goals  can now be found in the care plan section) Acute Rehab PT Goals Patient Stated Goal: to go home PT Goal Formulation: With patient Time For Goal Achievement: 12/15/20 Potential to Achieve Goals: Good Progress towards PT goals: Progressing toward goals    Frequency    Min 5X/week      PT Plan Current plan remains appropriate       AM-PAC PT "6 Clicks" Mobility   Outcome Measure  Help needed turning from your back to your side while in a flat bed without using bedrails?: A Little Help needed moving from lying on your back to sitting on the side of a flat bed without using bedrails?: A Little Help needed moving to and from a bed to a chair (including a wheelchair)?: None Help needed standing up from a chair using your arms (e.g., wheelchair or bedside chair)?: None Help needed to walk in hospital room?: A Little Help needed climbing 3-5 steps with a railing? : A Little 6 Click Score: 20    End of Session Equipment Utilized During Treatment: Back brace Activity Tolerance: Patient tolerated treatment well Patient left: in bed;with call bell/phone within reach Nurse Communication: Mobility status PT Visit Diagnosis: Other abnormalities of gait and mobility (R26.89)     Time: 0258-5277 PT Time Calculation (min) (ACUTE ONLY): 20 min  Charges:  $Gait Training: 8-22 mins                     Vickki Muff, PT, DPT   Acute Rehabilitation Department Pager #: [phone]   Ronnie Derby 12/03/2020, 8:23 AM

## 2020-12-03 NOTE — Plan of Care (Signed)
Patient alert and oriented, mae's well, voiding adequate amount of urine, swallowing without difficulty, no c/o pain at time of discharge. Patient discharged home with family. Script and discharged instructions given to patient. Patient and family stated understanding of instructions given. Patient has an appointment with Dr. Jones °

## 2020-12-03 NOTE — Discharge Summary (Signed)
Physician Discharge Summary  Patient ID: April Shaffer MRN: [id number] DOB/AGE: [birth year] 70 y.o.  Admit date: 11/30/2020 Discharge date: 12/03/2020  Admission Diagnoses: Pseudoarthrosis, lumbar   Discharge Diagnoses: Same   Discharged Condition: good  Hospital Course: The patient was admitted on 11/30/2020 and taken to the operating room where the patient underwent redo fusion with extension of instrumentation.  We did find a pocket of exudate around the L1-L2 screws on the right at the time of the exposure.  This is interesting since her infection was 5 months ago.  I asked infectious disease to see her and they started her on vancomycin to switch to Bactrim upon discharge with follow-up with them as an outpatient.  She remained on vancomycin in the hospital.  The patient tolerated the procedure well and was taken to the recovery room and then to the floor in stable condition. The hospital course was routine. There were no complications. The wound remained clean dry and intact. Pt had appropriate back soreness. No complaints of leg pain or new N/T/W. The patient remained afebrile with stable vital signs, and tolerated a regular diet. The patient continued to increase activities, and pain was well controlled with oral pain medications.   Consults: ID  Significant Diagnostic Studies:  Results for orders placed or performed during the hospital encounter of 11/30/20  Aerobic/Anaerobic Culture w Gram Stain (surgical/deep wound)   Specimen: Abscess; Wound  Result Value Ref Range   Specimen Description ABSCESS    Special Requests LUMBAR SWABS    Gram Stain      NO ORGANISMS SEEN SQUAMOUS EPITHELIAL CELLS PRESENT MODERATE WBC PRESENT, PREDOMINANTLY MONONUCLEAR FEW GRAM POSITIVE COCCI RARE GRAM NEGATIVE RODS    Culture      MODERATE STAPHYLOCOCCUS AUREUS SUSCEPTIBILITIES TO FOLLOW Performed at [facility], [place] [address], [place], [place] [address]    Report Status PENDING    Glucose, capillary  Result Value Ref Range   Glucose-Capillary 113 (H) 70 - 99 mg/dL  Glucose, capillary  Result Value Ref Range   Glucose-Capillary 185 (H) 70 - 99 mg/dL   Comment 1 Notify RN    Comment 2 Document in Chart   CBC  Result Value Ref Range   WBC 10.5 4.0 - 10.5 K/uL   RBC 2.95 (L) 3.87 - 5.11 MIL/uL   Hemoglobin 7.4 (L) 12.0 - 15.0 g/dL   HCT 23.8 (L) 36.0 - 46.0 %   MCV 80.7 80.0 - 100.0 fL   MCH 25.1 (L) 26.0 - 34.0 pg   MCHC 31.1 30.0 - 36.0 g/dL   RDW 19.9 (H) 11.5 - 15.5 %   Platelets 330 150 - 400 K/uL   nRBC 0.0 0.0 - 0.2 %  Glucose, capillary  Result Value Ref Range   Glucose-Capillary 205 (H) 70 - 99 mg/dL   Comment 1 Notify RN    Comment 2 Document in Chart   Glucose, capillary  Result Value Ref Range   Glucose-Capillary 225 (H) 70 - 99 mg/dL   Comment 1 Notify RN    Comment 2 Document in Chart   Glucose, capillary  Result Value Ref Range   Glucose-Capillary 168 (H) 70 - 99 mg/dL   Comment 1 Notify RN    Comment 2 Document in Chart   Glucose, capillary  Result Value Ref Range   Glucose-Capillary 148 (H) 70 - 99 mg/dL   Comment 1 Notify RN    Comment 2 Document in Chart   Hemoglobin and hematocrit, blood  Result Value Ref Range   Hemoglobin 10.9 (L) 12.0 - 15.0 g/dL   HCT 33.3 (L) 36.0 - 46.0 %  Glucose, capillary  Result Value Ref Range   Glucose-Capillary 180 (H) 70 - 99 mg/dL   Comment 1 Notify RN    Comment 2 Document in Chart   Glucose, capillary  Result Value Ref Range   Glucose-Capillary 94 70 - 99 mg/dL   Comment 1 Notify RN    Comment 2 Document in Chart   Basic metabolic panel  Result Value Ref Range   Sodium 133 (L) 135 - 145 mmol/L   Potassium 3.6 3.5 - 5.1 mmol/L   Chloride 102 98 - 111 mmol/L   CO2 23 22 - 32 mmol/L   Glucose, Bld 153 (H) 70 - 99 mg/dL   BUN 12 8 - 23 mg/dL   Creatinine, Ser 0.88 0.44 - 1.00 mg/dL   Calcium 8.0 (L) 8.9 - 10.3 mg/dL   GFR, Estimated >60 >60 mL/min   Anion gap 8 5 - 15  Glucose,  capillary  Result Value Ref Range   Glucose-Capillary 132 (H) 70 - 99 mg/dL   Comment 1 Notify RN    Comment 2 Document in Chart   Glucose, capillary  Result Value Ref Range   Glucose-Capillary 123 (H) 70 - 99 mg/dL   Comment 1 Notify RN    Comment 2 Document in Chart   Glucose, capillary  Result Value Ref Range   Glucose-Capillary 149 (H) 70 - 99 mg/dL   Comment 1 Notify RN    Comment 2 Document in Chart   Glucose, capillary  Result Value Ref Range   Glucose-Capillary 97 70 - 99 mg/dL   Comment 1 Notify RN    Comment 2 Document in Chart   I-STAT 7, (LYTES, BLD GAS, ICA, H+H)  Result Value Ref Range   pH, Arterial 7.444 7.350 - 7.450   pCO2 arterial 36.9 32.0 - 48.0 mmHg   pO2, Arterial 303 (H) 83.0 - 108.0 mmHg   Bicarbonate 26.1 20.0 - 28.0 mmol/L   TCO2 27 22 - 32 mmol/L   O2 Saturation 100.0 %   Acid-Base Excess 1.0 0.0 - 2.0 mmol/L   Sodium 139 135 - 145 mmol/L   Potassium 3.5 3.5 - 5.1 mmol/L   Calcium, Ion 1.19 1.15 - 1.40 mmol/L   HCT 27.0 (L) 36.0 - 46.0 %   Hemoglobin 9.2 (L) 12.0 - 15.0 g/dL   Patient temperature 33.9 C    Sample type ARTERIAL   Prepare RBC (crossmatch)  Result Value Ref Range   Order Confirmation      ORDER PROCESSED BY BLOOD BANK Performed at [facility], [address], [place], [place] [address]   Prepare RBC (crossmatch)  Result Value Ref Range   Order Confirmation      ORDER PROCESSED BY BLOOD BANK Performed at [facility], [place] [address], [place], [place] [address]     DG Lumbar Spine 2-3 Views  Result Date: 12/02/2020 CLINICAL DATA:  Low back pain after surgery. EXAM: LUMBAR SPINE - 2-3 VIEW COMPARISON:  August 23, 2020. FINDINGS: Status post surgical posterior fusion extending from S1 with bilateral intrapedicular screw placement. Interbody fusion is noted at L2-3, L3-4 and L4-5. L5 intrapedicular screws noted on prior exam have been removed. IMPRESSION: Postsurgical changes as described above. Aortic Atherosclerosis  (ICD10-I70.0). Electronically Signed   By: Marijo Conception M.D.   On: 12/02/2020 11:34   CT OArm  Limited Study-No Report  Result Date: 11/30/2020 Fluoroscopy was utilized by the requesting physician.  No radiographic interpretation.    Antibiotics:  Anti-infectives (From admission, onward)    Start     Dose/Rate Route Frequency Ordered Stop   12/03/20 0000  sulfamethoxazole-trimethoprim (BACTRIM DS) 800-160 MG tablet        1 tablet Oral 2 times daily 12/03/20 0757     12/02/20 1200  vancomycin (VANCOCIN) IVPB 1000 mg/200 mL premix        1,000 mg 200 mL/hr over 60 Minutes Intravenous Every 24 hours 12/01/20 1158     12/01/20 1200  vancomycin (VANCOREADY) IVPB 1500 mg/300 mL  Status:  Discontinued        1,500 mg 150 mL/hr over 120 Minutes Intravenous  Once 12/01/20 1059 12/01/20 1106   12/01/20 1200  vancomycin (VANCOREADY) IVPB 1250 mg/250 mL        1,250 mg 166.7 mL/hr over 90 Minutes Intravenous  Once 12/01/20 1106 12/01/20 1519   11/30/20 2200  ceFAZolin (ANCEF) IVPB 2g/100 mL premix        2 g 200 mL/hr over 30 Minutes Intravenous Every 8 hours 11/30/20 2029 12/01/20 0631   11/30/20 1802  vancomycin (VANCOCIN) powder  Status:  Discontinued          As needed 11/30/20 1802 11/30/20 1842   11/30/20 1113  ceFAZolin (ANCEF) 2-4 GM/100ML-% IVPB       Note to Pharmacy: Ladoris Gene   : cabinet override      11/30/20 1113 11/30/20 2314       Discharge Exam: Blood pressure (!) 136/54, pulse 86, temperature 97.9 F (36.6 C), temperature source Oral, resp. rate 16, height '5\' 3"'  (1.6 m), weight 61.2 kg, SpO2 100 %. Neurologic: Grossly normal Incision clean dry and intact  Discharge Medications:   Allergies as of 12/03/2020       Reactions   Lisinopril-hydrochlorothiazide Swelling   Angioedema  - Tongue swelling    Codeine Itching   Oxycodone Itching   Simvastatin Other (See Comments)   Muscle pain   Tramadol Itching        Medication List     TAKE these medications     allopurinol 300 MG tablet Commonly known as: ZYLOPRIM Take 1 tablet (300 mg total) by mouth daily.   amLODipine 10 MG tablet Commonly known as: NORVASC Take 10 mg by mouth daily.   amLODipine 5 MG tablet Commonly known as: NORVASC Take 1 tablet (5 mg total) by mouth 2 (two) times daily.   aspirin EC 81 MG tablet Take 81 mg by mouth daily.   atorvastatin 80 MG tablet Commonly known as: LIPITOR TAKE 1 TABLET BY MOUTH ONCE DAILY WITH  EVENING  MEAL What changed:  how much to take how to take this when to take this additional instructions   blood glucose meter kit and supplies Kit Check sugars twice daily   carvedilol 20 MG 24 hr capsule Commonly known as: COREG CR Take 1 capsule (20 mg total) by mouth daily.   clopidogrel 75 MG tablet Commonly known as: PLAVIX Take 75 mg by mouth daily.   ezetimibe 10 MG tablet Commonly known as: ZETIA TAKE 1 TABLET EVERY DAY (NEED MD APPOINTMENT)   gabapentin 300 MG capsule Commonly known as: NEURONTIN 1 cap by mouth in morning, 1 cap by mouth midday and 2 caps by mouth before bedtime.   gabapentin 600 MG tablet Commonly known as: NEURONTIN Take 600 mg by mouth in the  morning and at bedtime.   glucose blood test strip Check sugars twice daily   metFORMIN 500 MG tablet Commonly known as: GLUCOPHAGE Take 500 mg by mouth 2 (two) times daily with a meal.   methocarbamol 500 MG tablet Commonly known as: Robaxin Take 1 tablet (500 mg total) by mouth 4 (four) times daily.   Mitigare 0.6 MG Caps Generic drug: Colchicine TAKE 1 CAPSULE TWICE DAILY AS NEEDED FOR GOUT FLARE AND HOLD ATORVASTATIN ON DAYS TAKING MITIGARE   oxyCODONE-acetaminophen 10-325 MG tablet Commonly known as: PERCOCET Take 1 tablet by mouth every 4 (four) hours as needed for pain. What changed:  when to take this Another medication with the same name was removed. Continue taking this medication, and follow the directions you see here.    sulfamethoxazole-trimethoprim 800-160 MG tablet Commonly known as: BACTRIM DS Take 1 tablet by mouth 2 (two) times daily.               Durable Medical Equipment  (From admission, onward)           Start     Ordered   11/30/20 2030  DME Walker rolling  Once       Question:  Patient needs a walker to treat with the following condition  Answer:  S/P lumbar fusion   11/30/20 2029   11/30/20 2030  DME 3 n 1  Once        11/30/20 2029            Disposition: Home   Final Dx: Redo L1-S2 fusion     Follow-up Information     [facility], [facility] Follow up.   Specialty: Home Health Services Why: Your Home health has been set up with [facility]. The office will call you with start of service information. Contact information: [place] [address] [phone]         Eustace Moore, MD. Schedule an appointment as soon as possible for a visit in 2 week(s).   Specialty: Neurosurgery Contact information: [address] [place] [address] [place] [address] [phone]                  Signed: Eustace Moore 12/03/2020, 7:57 AM

## 2020-12-06 LAB — AEROBIC/ANAEROBIC CULTURE W GRAM STAIN (SURGICAL/DEEP WOUND)

## 2020-12-12 ENCOUNTER — Ambulatory Visit (INDEPENDENT_AMBULATORY_CARE_PROVIDER_SITE_OTHER): Payer: Medicare HMO | Admitting: Internal Medicine

## 2020-12-12 ENCOUNTER — Other Ambulatory Visit: Payer: Self-pay

## 2020-12-12 ENCOUNTER — Encounter: Payer: Self-pay | Admitting: Internal Medicine

## 2020-12-12 VITALS — BP 129/73 | HR 84 | Temp 97.7°F

## 2020-12-12 DIAGNOSIS — T8149XA Infection following a procedure, other surgical site, initial encounter: Secondary | ICD-10-CM

## 2020-12-12 DIAGNOSIS — L24A9 Irritant contact dermatitis due friction or contact with other specified body fluids: Secondary | ICD-10-CM | POA: Diagnosis not present

## 2020-12-12 DIAGNOSIS — R110 Nausea: Secondary | ICD-10-CM

## 2020-12-12 DIAGNOSIS — Z5181 Encounter for therapeutic drug level monitoring: Secondary | ICD-10-CM

## 2020-12-12 MED ORDER — ONDANSETRON 4 MG PO TBDP: 4.0000 mg | ORAL_TABLET | Freq: Two times a day (BID) | ORAL | 0 refills | Status: DC

## 2020-12-12 NOTE — Progress Notes (Signed)
   Subjective:    Patient ID: April Shaffer, female    DOB: [birth year], 70 y.o.   MRN: [id number]  HPI Here for hsfu She has a history of lumbar fusion L1-5 with cortical pedical screws done in February 2022 and developed a deep wound infection and treated with prolonged daptomycin and rifampin.  Did have some elevated CK so changed to vancomycin and completed antibiotics.  She was brought back to the OR by Dr. Yetta Barre 11/30/20 for pseudoarthrosis and loosening of instrumentation and during the surgery found one of the removed screws with a small pocket of pus at the site of the screw.  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  No other signs of infection or pus anywhere else.     Review of Systems  Constitutional:  Negative for chills, fatigue and fever.  Gastrointestinal:  Negative for diarrhea and nausea.  Musculoskeletal:        She is having really minimal back pain and less then prior to the surgery.  No drainage in her back.   Skin:  Negative for rash.      Objective:   Physical Exam Eyes:     General: No scleral icterus. Pulmonary:     Effort: Pulmonary effort is normal.  Skin:    Findings: No rash.  Neurological:     Mental Status: She is alert.  Psychiatric:        Mood and Affect: Mood normal.   SH: no tobacco       Assessment & Plan:

## 2020-12-12 NOTE — Assessment & Plan Note (Signed)
I will check her bmp today on bactrim.

## 2020-12-12 NOTE — Assessment & Plan Note (Signed)
Really seems like an isolated small pocket of pus under the screw and did not seem to be causing any issues.  Regardless, I will have her continue with oral Bactrim to be sure it clears any remnants out and check her inflammatory makers today.   rtc in 2 weeks

## 2020-12-12 NOTE — Assessment & Plan Note (Signed)
Some issues with nausea and may be related to the Bactrim.  I will have her take zofran 30 minutes prior to each pill, as needed.

## 2020-12-13 LAB — BASIC METABOLIC PANEL
BUN: 10 mg/dL (ref 7–25)
CO2: 19 mmol/L — ABNORMAL LOW (ref 20–32)
Calcium: 9.1 mg/dL (ref 8.6–10.4)
Chloride: 103 mmol/L (ref 98–110)
Creat: 1 mg/dL (ref 0.50–1.05)
Glucose, Bld: 170 mg/dL — ABNORMAL HIGH (ref 65–99)
Potassium: 4.7 mmol/L (ref 3.5–5.3)
Sodium: 133 mmol/L — ABNORMAL LOW (ref 135–146)

## 2020-12-13 LAB — SEDIMENTATION RATE: Sed Rate: 56 mm/h — ABNORMAL HIGH (ref 0–30)

## 2020-12-13 LAB — C-REACTIVE PROTEIN: CRP: 28.4 mg/L — ABNORMAL HIGH (ref <8.0)

## 2020-12-20 ENCOUNTER — Ambulatory Visit (INDEPENDENT_AMBULATORY_CARE_PROVIDER_SITE_OTHER): Payer: Medicare HMO | Admitting: Cardiovascular Disease

## 2020-12-20 ENCOUNTER — Encounter: Payer: Self-pay | Admitting: Cardiovascular Disease

## 2020-12-20 ENCOUNTER — Other Ambulatory Visit: Payer: Self-pay

## 2020-12-20 DIAGNOSIS — I2510 Atherosclerotic heart disease of native coronary artery without angina pectoris: Secondary | ICD-10-CM

## 2020-12-20 DIAGNOSIS — E785 Hyperlipidemia, unspecified: Secondary | ICD-10-CM | POA: Diagnosis not present

## 2020-12-20 DIAGNOSIS — E1169 Type 2 diabetes mellitus with other specified complication: Secondary | ICD-10-CM | POA: Diagnosis not present

## 2020-12-20 DIAGNOSIS — I10 Essential (primary) hypertension: Secondary | ICD-10-CM

## 2020-12-20 LAB — HEPATIC FUNCTION PANEL
ALT: 50 IU/L — ABNORMAL HIGH (ref 0–32)
AST: 71 IU/L — ABNORMAL HIGH (ref 0–40)
Albumin: 3.4 g/dL — ABNORMAL LOW (ref 3.8–4.8)
Alkaline Phosphatase: 189 IU/L — ABNORMAL HIGH (ref 44–121)
Bilirubin Total: 0.2 mg/dL (ref 0.0–1.2)
Bilirubin, Direct: 0.1 mg/dL (ref 0.00–0.40)
Total Protein: 8.2 g/dL (ref 6.0–8.5)

## 2020-12-20 LAB — LIPID PANEL
Chol/HDL Ratio: 3.7 ratio (ref 0.0–4.4)
Cholesterol, Total: 122 mg/dL (ref 100–199)
HDL: 33 mg/dL — ABNORMAL LOW (ref >39)
LDL Chol Calc (NIH): 72 mg/dL (ref 0–99)
Triglycerides: 85 mg/dL (ref 0–149)
VLDL Cholesterol Cal: 17 mg/dL (ref 5–40)

## 2020-12-20 NOTE — Progress Notes (Signed)
12/20/2020 MARSHA GUNDLACH   [birth year]  [id number]  Primary Physician Sonia Side., FNP Primary Cardiologist: Lorretta Harp MD Lupe Carney, [place]  HPI:  April Shaffer is a 70 y.o.  moderately overweight divorced African-American female mother of 2, grandmother of 6 grandchildren who is retired from being a Secretary/administrator.  She was referred by Dr. Amil Amen for preoperative clearance before elective total hip replacement.  I last saw her in the office 12/08/2019.  She is accompanied by her friend Gene  this morning.  Risk factors include 20-pack-year tobacco abuse currently smoking one third of a pack a day.  She has treated hypertension, diabetes and hyperlipidemia.  She is never had a heart attack or stroke although she does have known carotid artery disease with Dopplers performed 03/03/2018 revealing an occluded right internal carotid artery and moderate left ICA stenosis.  She did have a positive Myoview 12/31/2011 which led to a diagnostic cath by Dr. Fletcher Anon in [facility] 01/16/2012 that showed noncritical CAD.  Medical therapy was recommended.   She underwent Myoview stress testing for preoperative clearance before her right total hip replacement on 03/28/2018 which was entirely normal.  She had a hip replacement done June 2020 and has recuperated nicely from this.  Since I saw her a year ago she did undergo repeat back surgery which she is recuperating from.  She denies chest pain or shortness of breath.  Current Meds  Medication Sig   allopurinol (ZYLOPRIM) 300 MG tablet Take 1 tablet (300 mg total) by mouth daily.   amLODipine (NORVASC) 10 MG tablet Take 10 mg by mouth daily.   aspirin EC 81 MG tablet Take 81 mg by mouth daily.   atorvastatin (LIPITOR) 80 MG tablet TAKE 1 TABLET BY MOUTH ONCE DAILY WITH  EVENING  MEAL (Patient taking differently: Take 80 mg by mouth in the morning.)   blood glucose meter kit and supplies KIT Check sugars twice daily   carvedilol (COREG CR)  20 MG 24 hr capsule Take 1 capsule (20 mg total) by mouth daily.   clopidogrel (PLAVIX) 75 MG tablet Take 75 mg by mouth daily.   ezetimibe (ZETIA) 10 MG tablet TAKE 1 TABLET EVERY DAY (NEED MD APPOINTMENT)   gabapentin (NEURONTIN) 300 MG capsule 1 cap by mouth in morning, 1 cap by mouth midday and 2 caps by mouth before bedtime.   glucose blood test strip Check sugars twice daily   metFORMIN (GLUCOPHAGE) 500 MG tablet Take 500 mg by mouth 2 (two) times daily with a meal.   methocarbamol (ROBAXIN) 500 MG tablet Take 1 tablet (500 mg total) by mouth 4 (four) times daily.   ondansetron (ZOFRAN-ODT) 4 MG disintegrating tablet Take 1 tablet (4 mg total) by mouth 2 (two) times daily. 30 minutes prior to antibiotic   oxyCODONE-acetaminophen (PERCOCET/ROXICET) 5-325 MG tablet Take by mouth.   sulfamethoxazole-trimethoprim (BACTRIM DS) 800-160 MG tablet Take 1 tablet by mouth 2 (two) times daily.     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    Comment: [employer]-retired 07/23/2014  Tobacco Use   Smoking status: Every Day    Packs/day: 0.10    Years: 25.00    Pack years: 2.50    Types: Cigarettes    Start date: 05/31/1968   Smokeless tobacco: Never   Tobacco comments:    pt states she smokes about 4-5 cigs per day--states she contiues to work on it.  No interest in other help now.  Vaping Use   Vaping Use: Never used  Substance and Sexual Activity   Alcohol use: Yes    Comment: rare   Drug use: No   Sexual activity: Not on  file  Other Topics Concern   Not on file  Social History Narrative   Lives with long term boyfriend (30 years together)   Both sons live in [place] Determinants of Health   Financial Resource Strain: Not on file  Food Insecurity: Not on file  Transportation Needs: Not on file  Physical Activity: Not on file  Stress: Not on file  Social Connections: Not on file  Intimate Partner Violence: Not on file     Review of Systems: General: negative for chills, fever, night sweats or weight changes.  Cardiovascular: negative for chest pain, dyspnea on exertion, edema, orthopnea, palpitations, paroxysmal nocturnal dyspnea or shortness of breath Dermatological: negative for rash Respiratory: negative for cough or wheezing Urologic: negative for hematuria Abdominal: negative for nausea, vomiting, diarrhea, bright red blood per rectum, melena, or hematemesis Neurologic: negative for visual changes, syncope, or dizziness All other systems reviewed and are otherwise negative except as noted above.    Blood pressure 102/60, pulse 76, height '5\' 3"'  (1.6 m), weight 129 lb 6.4 oz (58.7 kg), SpO2 99 %.  General appearance: alert and no distress Neck: no adenopathy, no JVD, supple, symmetrical, trachea midline, thyroid not enlarged, symmetric, no tenderness/mass/nodules, and bilateral carotid bruits Lungs: clear to auscultation bilaterally Heart: regular rate and rhythm, S1, S2 normal, no murmur, click, rub or gallop Extremities: extremities normal, atraumatic, no cyanosis or edema Pulses: 2+ and symmetric Skin: Skin color, texture, turgor normal. No rashes or lesions Neurologic: Grossly normal  EKG sinus rhythm at 76 without ST or T wave changes.  I personally reviewed this EKG.  ASSESSMENT AND PLAN:   Hyperlipidemia associated with type 2 diabetes mellitus ([place]) History of hyperlipidemia on statin therapy with lipid profile performed 08/04/2019 revealing total cholesterol 148, LDL  of 79 and HDL 56.  We will recheck a lipid liver profile this morning.  Essential hypertension History of essential hypertension a blood pressure measured today at 102/60.  She is on amlodipine, and carvedilol.  Carotid artery stenosis s/p CEA 2013 History of right carotid endarterectomy in 2013 with Dopplers followed by [facility] .  Her most recent Dopplers performed 10/09/2019 revealed an occluded right carotid artery with moderately severe left ICA stenosis.  Coronary artery disease Cardiac catheterization performed by Dr. Fletcher Anon 01/16/2012 showed noncritical CAD.  Patient denies chest pain or shortness of breath.     Lorretta Harp MD FACP,FACC,FAHA, [facility] 12/20/2020 11:16 AM

## 2020-12-20 NOTE — Patient Instructions (Signed)
Medication Instructions:  °Your physician recommends that you continue on your current medications as directed. Please refer to the Current Medication list given to you today. ° °*If you need a refill on your cardiac medications before your next appointment, please call your pharmacy* ° ° °Lab Work: °Your physician recommends that you have labs drawn today: Lipid/liver profile ° °If you have labs (blood work) drawn today and your tests are completely normal, you will receive your results only by: °MyChart Message (if you have MyChart) OR °A paper copy in the mail °If you have any lab test that is abnormal or we need to change your treatment, we will call you to review the results. ° ° ° °Follow-Up: °At [facility], you and your health needs are our priority.  As part of our continuing mission to provide you with exceptional heart care, we have created designated Provider Care Teams.  These Care Teams include your primary Cardiologist (physician) and Advanced Practice Providers (APPs -  Physician Assistants and Nurse Practitioners) who all work together to provide you with the care you need, when you need it. ° °We recommend signing up for the patient portal called "MyChart".  Sign up information is provided on this After Visit Summary.  MyChart is used to connect with patients for Virtual Visits (Telemedicine).  Patients are able to view lab/test results, encounter notes, upcoming appointments, etc.  Non-urgent messages can be sent to your provider as well.   °To learn more about what you can do with MyChart, go to [email or web address].   ° °Your next appointment:   °12 month(s) ° °The format for your next appointment:   °In Person ° °Provider:   °Jonathan Berry, MD °

## 2020-12-20 NOTE — Assessment & Plan Note (Signed)
History of hyperlipidemia on statin therapy with lipid profile performed 08/04/2019 revealing total cholesterol 148, LDL of 79 and HDL 56.  We will recheck a lipid liver profile this morning.

## 2020-12-20 NOTE — Assessment & Plan Note (Signed)
History of essential hypertension a blood pressure measured today at 102/60.  She is on amlodipine, and carvedilol.

## 2020-12-20 NOTE — Assessment & Plan Note (Signed)
History of right carotid endarterectomy in 2013 with Dopplers followed by [facility] .  Her most recent Dopplers performed 10/09/2019 revealed an occluded right carotid artery with moderately severe left ICA stenosis.

## 2020-12-20 NOTE — Assessment & Plan Note (Signed)
Cardiac catheterization performed by Dr. Kirke Corin 01/16/2012 showed noncritical CAD.  Patient denies chest pain or shortness of breath.

## 2020-12-27 ENCOUNTER — Encounter: Payer: Self-pay | Admitting: Internal Medicine

## 2020-12-27 ENCOUNTER — Ambulatory Visit (INDEPENDENT_AMBULATORY_CARE_PROVIDER_SITE_OTHER): Payer: Medicare HMO | Admitting: Internal Medicine

## 2020-12-27 ENCOUNTER — Other Ambulatory Visit: Payer: Self-pay

## 2020-12-27 VITALS — BP 131/78 | HR 90 | Temp 97.5°F

## 2020-12-27 DIAGNOSIS — T8149XA Infection following a procedure, other surgical site, initial encounter: Secondary | ICD-10-CM | POA: Diagnosis not present

## 2020-12-27 DIAGNOSIS — R110 Nausea: Secondary | ICD-10-CM

## 2020-12-27 DIAGNOSIS — Z5181 Encounter for therapeutic drug level monitoring: Secondary | ICD-10-CM

## 2020-12-27 NOTE — Assessment & Plan Note (Addendum)
No new concerns with healing or her wound and I will check her inflammatory markers again today and if stable, she will complete the one month of Bactrim and stop next week and observe off of antibiotics.    ADDENDUM 12/29/20: ESR 118 and CRP 77.2 which is up from previous.  Clinically though she is doing well so no clear reason for the elevation.  At this time, will continue with the plan to stop the Bactrim and observe off of antibiotics.

## 2020-12-27 NOTE — Progress Notes (Signed)
   Subjective:    Patient ID: KENNETH LAX, female    DOB: November 20, 1950, 70 y.o.   MRN: [id number]  HPI Here for hsfu She has a history of lumbar fusion L1-5 with cortical pedical screws done in February 2022 and developed a deep wound infection and treated with prolonged daptomycin and rifampin.  Did have some elevated CK so changed to vancomycin and completed antibiotics.  She was brought back to the OR by Dr. Yetta Barre 11/30/20 for pseudoarthrosis and loosening of instrumentation and during the surgery found one of the removed screws with a small pocket of pus at the site of the screw.  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  No other signs of infection or pus anywhere else.    She is here for follow up today and doing relatively well.  Had the staples removed after her last visit by Dr. Yetta Barre and felt to be doing well.  Taking zofran prior to the bactrim and much better in regards to nausea.  Pain manageable and moving more.  Here with her brother today.  No fever, no chills.    Review of Systems  Constitutional:  Negative for chills and fatigue.  Gastrointestinal:  Negative for diarrhea and nausea.  Musculoskeletal:        She is having really minimal back pain and less then prior to the surgery.  No drainage in her back.   Skin:  Negative for rash.      Objective:   Physical Exam Eyes:     General: No scleral icterus. Pulmonary:     Effort: Pulmonary effort is normal. No respiratory distress.  Skin:    Findings: No rash.  Neurological:     Mental Status: She is alert.  Psychiatric:        Mood and Affect: Mood normal.   SH: no tobacco      Assessment & Plan:

## 2020-12-27 NOTE — Assessment & Plan Note (Signed)
Much improved with the zofran so will continue with this until she completes the Bactrim.  If her nausea persists after stopping the bactrim, she will need to discuss it with her PCP to look for other causes, but likely the bactrim.

## 2020-12-27 NOTE — Assessment & Plan Note (Signed)
Will check her creat on the Bactrim again.

## 2020-12-28 LAB — BASIC METABOLIC PANEL
BUN: 17 mg/dL (ref 7–25)
CO2: 22 mmol/L (ref 20–32)
Calcium: 9.2 mg/dL (ref 8.6–10.4)
Chloride: 103 mmol/L (ref 98–110)
Creat: 0.78 mg/dL (ref 0.50–1.05)
Glucose, Bld: 165 mg/dL — ABNORMAL HIGH (ref 65–99)
Potassium: 4.4 mmol/L (ref 3.5–5.3)
Sodium: 135 mmol/L (ref 135–146)

## 2020-12-28 LAB — SEDIMENTATION RATE: Sed Rate: 118 mm/h — ABNORMAL HIGH (ref 0–30)

## 2020-12-28 LAB — C-REACTIVE PROTEIN: CRP: 77.2 mg/L — ABNORMAL HIGH (ref <8.0)

## 2020-12-29 ENCOUNTER — Ambulatory Visit (INDEPENDENT_AMBULATORY_CARE_PROVIDER_SITE_OTHER): Payer: Medicare HMO | Admitting: Vascular Surgery

## 2020-12-29 ENCOUNTER — Ambulatory Visit (HOSPITAL_COMMUNITY)
Admission: RE | Admit: 2020-12-29 | Discharge: 2020-12-29 | Disposition: A | Discharge status: Home / Self Care | Payer: Medicare HMO | Source: Ambulatory Visit | Attending: Vascular Surgery | Admitting: Vascular Surgery

## 2020-12-29 ENCOUNTER — Encounter (HOSPITAL_COMMUNITY): Payer: Medicare HMO

## 2020-12-29 ENCOUNTER — Ambulatory Visit: Payer: Medicare HMO

## 2020-12-29 ENCOUNTER — Encounter: Payer: Self-pay | Admitting: Vascular Surgery

## 2020-12-29 ENCOUNTER — Other Ambulatory Visit: Payer: Self-pay

## 2020-12-29 VITALS — BP 111/63 | HR 68 | Temp 98.1°F | Resp 18 | Ht 63.0 in | Wt 129.0 lb

## 2020-12-29 DIAGNOSIS — I2510 Atherosclerotic heart disease of native coronary artery without angina pectoris: Secondary | ICD-10-CM

## 2020-12-29 DIAGNOSIS — I6521 Occlusion and stenosis of right carotid artery: Secondary | ICD-10-CM | POA: Diagnosis not present

## 2020-12-29 DIAGNOSIS — I6522 Occlusion and stenosis of left carotid artery: Secondary | ICD-10-CM | POA: Diagnosis not present

## 2020-12-29 DIAGNOSIS — I6523 Occlusion and stenosis of bilateral carotid arteries: Secondary | ICD-10-CM | POA: Diagnosis not present

## 2020-12-29 NOTE — Progress Notes (Signed)
Patient ID: April Shaffer, female   DOB: Jan 11, 1951, 70 y.o.   MRN: [id number]  Reason for Consult: Carotid (1 year bilateral carotid FU with carotid duplex prior )   Referred by Sonia Side., FNP  Subjective:     HPI:  April Shaffer is a 70 y.o. female with remote history of right carotid endarterectomy that subsequently occluded and she was asymptomatic from this.  She has high-grade stenosis of the left carotid artery which has been followed and she is maximally medically managed with aspirin, Plavix and statin.  She denies any recent stroke, TIA or amaurosis.  She does have recent back surgery actually required 3 back surgeries in the past year.  Her chief complaint is pain from the surgery but she is not having any other issues.  She has no leg pain, no tissue loss or ulceration.  Past Medical History:  Diagnosis Date   Arthritis    Carotid artery narrowing    Coronary artery disease    Cardiac catheterization November 2013: 50% ostial LAD stenosis 50% mid stenosis. 30% disease in the left circumflex.   Diabetes mellitus, type 2 ([place])    Hyperlipidemia    Hypertension    Onychomycosis of toenail 07/31/2016   Sleep apnea       Had surgery to correct   Family History  Problem Relation Age of Onset   Hypertension Sister    Hypothyroidism Sister    Cancer Maternal Grandmother        lung   Hypertension Son    Hypertension Sister    Diabetes Brother        lost toe in 2018 with new diagnosis of DM   Hypertension Son    Past Surgical History:  Procedure Laterality Date   ABDOMINAL HYSTERECTOMY     APPLICATION OF INTRAOPERATIVE CT SCAN N/A 11/30/2020   Procedure: APPLICATION OF INTRAOPERATIVE CT SCAN;  Surgeon: Eustace Moore, MD;  Location: [facility];  Service: Neurosurgery;  Laterality: N/A;   BACK SURGERY     CARDIAC CATHETERIZATION     CATARACT EXTRACTION, BILATERAL Bilateral 2021   Dr. Sherral Hammers   ENDARTERECTOMY  09/27/2011   Procedure: RIGT ENDARTERECTOMY  CAROTID;  Surgeon: Mal Misty, MD;  Location: [facility];  Service: Vascular;  Laterality: Right;   EPIDURAL BLOCK INJECTION  02/2008   Drs. Eulis Manly   gyn surgery  2004   total hysterectomy for mennorhagia,,salpingoophorectomy   LAMINECTOMY WITH POSTERIOR LATERAL ARTHRODESIS LEVEL 2 N/A 11/30/2020   Procedure: LUMBAR FOUR-FIVE, LUMBAR FIVE-SACRAL ONE POSTERIOR LATERAL FUSION WITH REVISION OF LUMBAR ONE-FIVE HARDWARE AND EXTENSION TO SACRAL ONE AND SACRAL TWO;  Surgeon: Eustace Moore, MD;  Location: [facility];  Service: Neurosurgery;  Laterality: N/A;   LUMBAR WOUND DEBRIDEMENT N/A 05/25/2020   Procedure: LUMBAR WOUND IRRIGATION AND DEBRIDEMENT;  Surgeon: Eustace Moore, MD;  Location: [place];  Service: Neurosurgery;  Laterality: N/A;   POSTERIOR LUMBAR FUSION 4 LEVEL N/A 04/25/2020   Procedure: POSTERIOR LUMBAR INTERBODY FUSION LUMBAR ONE-TWO, LUMBAR TWO-THREE, LUMBAR THREE-FOUR,LUMBAR FOUR-FIVE.;  Surgeon: Eustace Moore, MD;  Location: [facility];  Service: Neurosurgery;  Laterality: N/A;  posterior   TONSILLECTOMY     TOTAL ABDOMINAL HYSTERECTOMY W/ BILATERAL SALPINGOOPHORECTOMY Bilateral 2000   TOTAL HIP ARTHROPLASTY Right 08/22/2018   Procedure: TOTAL HIP ARTHROPLASTY;  Surgeon: Earlie Server, MD;  Location: [facility];  Service: Orthopedics;  Laterality: Right;   VARICOSE VEIN SURGERY  2008   stripping  Short Social History:  Social History   Tobacco Use   Smoking status: Former    Packs/day: 0.10    Years: 25.00    Pack years: 2.50    Types: Cigarettes    Start date: 05/31/1968    Quit date: 11/30/2020    Years since quitting: 0.0   Smokeless tobacco: Never   Tobacco comments:    pt states she smokes about 4-5 cigs per day--states she contiues to work on it.  No interest in other help now.  Substance Use Topics   Alcohol use: Yes    Comment: rare    Allergies  Allergen Reactions   Lisinopril-Hydrochlorothiazide Swelling    Angioedema  - Tongue swelling    Codeine  Itching   Oxycodone Itching   Simvastatin Other (See Comments)    Muscle pain   Tramadol Itching    Current Outpatient Medications  Medication Sig Dispense Refill   allopurinol (ZYLOPRIM) 300 MG tablet Take 1 tablet (300 mg total) by mouth daily. 90 tablet 3   amLODipine (NORVASC) 10 MG tablet Take 10 mg by mouth daily.     amLODipine (NORVASC) 5 MG tablet Take 1 tablet (5 mg total) by mouth 2 (two) times daily. 60 tablet 1   aspirin EC 81 MG tablet Take 81 mg by mouth daily.     atorvastatin (LIPITOR) 80 MG tablet TAKE 1 TABLET BY MOUTH ONCE DAILY WITH  EVENING  MEAL (Patient taking differently: Take 80 mg by mouth in the morning.) 90 tablet 3   blood glucose meter kit and supplies KIT Check sugars twice daily 1 each 0   carvedilol (COREG CR) 20 MG 24 hr capsule Take 1 capsule (20 mg total) by mouth daily. 90 capsule 3   clopidogrel (PLAVIX) 75 MG tablet Take 75 mg by mouth daily.     ezetimibe (ZETIA) 10 MG tablet TAKE 1 TABLET EVERY DAY (NEED MD APPOINTMENT) 90 tablet 0   gabapentin (NEURONTIN) 300 MG capsule 1 cap by mouth in morning, 1 cap by mouth midday and 2 caps by mouth before bedtime. 360 capsule 3   gabapentin (NEURONTIN) 600 MG tablet Take 600 mg by mouth in the morning and at bedtime.     glucose blood test strip Check sugars twice daily 100 each 11   metFORMIN (GLUCOPHAGE) 500 MG tablet Take 500 mg by mouth 2 (two) times daily with a meal.     methocarbamol (ROBAXIN) 500 MG tablet Take 1 tablet (500 mg total) by mouth 4 (four) times daily. 45 tablet 0   methocarbamol (ROBAXIN) 500 MG tablet Take 1 tablet by mouth every 6 (six) hours as needed.     MITIGARE 0.6 MG CAPS TAKE 1 CAPSULE TWICE DAILY AS NEEDED FOR GOUT FLARE AND HOLD ATORVASTATIN ON DAYS TAKING MITIGARE 30 capsule 1   ondansetron (ZOFRAN-ODT) 4 MG disintegrating tablet Take 1 tablet (4 mg total) by mouth 2 (two) times daily. 30 minutes prior to antibiotic 30 tablet 0   oxyCODONE-acetaminophen (PERCOCET/ROXICET)  5-325 MG tablet Take by mouth.     sulfamethoxazole-trimethoprim (BACTRIM DS) 800-160 MG tablet Take 1 tablet by mouth 2 (two) times daily. 60 tablet 1   oxyCODONE-acetaminophen (PERCOCET) 10-325 MG tablet Take 1 tablet by mouth every 4 (four) hours as needed for pain. (Patient not taking: Reported on 12/29/2020) 40 tablet 0   No current facility-administered medications for this visit.    Review of Systems  Constitutional:  Constitutional negative. HENT: HENT negative.  Eyes: Eyes negative.  Respiratory: Respiratory negative.  Cardiovascular: Cardiovascular negative.  GI: Gastrointestinal negative.  Musculoskeletal: Positive for back pain and gait problem.  Neurological: Negative for dizziness, facial asymmetry, focal weakness, headaches, light-headedness, numbness, seizures, speech difficulty and syncope.  Hematologic: Hematologic/lymphatic negative.  Psychiatric: Psychiatric negative.       Objective:  Objective   Vitals:   12/29/20 0952 12/29/20 0955  BP: 106/68 111/63  Pulse: 68   Resp: 18   Temp: 98.1 F (36.7 C)   TempSrc: Temporal   SpO2: 99%   Weight: 129 lb (58.5 kg)   Height: '5\' 3"'  (1.6 m)    Body mass index is 22.85 kg/m.  Physical Exam Constitutional:      Comments: She is currently in a wheelchair  HENT:     Head: Normocephalic.     Nose:     Comments: Wearing a mask Eyes:     Pupils: Pupils are equal, round, and reactive to light.  Neck:     Vascular: Carotid bruit present.  Cardiovascular:     Pulses:          Radial pulses are 2+ on the right side and 2+ on the left side.       Popliteal pulses are 2+ on the right side and 2+ on the left side.     Comments: Left carotid bruit Abdominal:     General: Abdomen is flat.     Palpations: Abdomen is soft. There is no mass.  Musculoskeletal:        General: Normal range of motion.     Right lower leg: No edema.     Left lower leg: No edema.  Skin:    Capillary Refill: Capillary refill takes less  than 2 seconds.  Neurological:     General: No focal deficit present.     Mental Status: She is alert.  Psychiatric:        Mood and Affect: Mood normal.        Behavior: Behavior normal.        Thought Content: Thought content normal.    Data: Right Carotid Findings:  +----------+--------+--------+--------+------------------+-----------------  -+            PSV cm/sEDV cm/sStenosisPlaque DescriptionComments             +----------+--------+--------+--------+------------------+-----------------  -+  CCA Prox  77                                                             +----------+--------+--------+--------+------------------+-----------------  -+  CCA Mid   147     2               heterogenous                           +----------+--------+--------+--------+------------------+-----------------  -+  CCA [id number]                     heterogenous      intimal  thickening  +----------+--------+--------+--------+------------------+-----------------  -+  ICA Prox                  Occluded                                       +----------+--------+--------+--------+------------------+-----------------  -+  ICA Mid                   Occluded                                       +----------+--------+--------+--------+------------------+-----------------  -+  ICA Distal                Occluded                                       +----------+--------+--------+--------+------------------+-----------------  -+  ECA       125     1               heterogenous                           +----------+--------+--------+--------+------------------+-----------------  -+   +----------+--------+-------+----------------+-------------------+            PSV cm/sEDV cmsDescribe        Arm Pressure (mmHG)  +----------+--------+-------+----------------+-------------------+  Subclavian205     0      Multiphasic, WNL                      +----------+--------+-------+----------------+-------------------+   +---------+--------+--+--------+--+---------+  VertebralPSV cm/s61EDV cm/s15Antegrade  +---------+--------+--+--------+--+---------+       Left Carotid Findings:  +----------+--------+--------+--------+---------------------+--------------  ----+            PSV cm/sEDV cm/sStenosisPlaque Description   Comments              +----------+--------+--------+--------+---------------------+--------------  ----+  CCA Prox  87      18              heterogenous                               +----------+--------+--------+--------+---------------------+--------------  ----+  CCA Mid   84      17              heterogenous                               +----------+--------+--------+--------+---------------------+--------------  ----+  CCA Distal106     29              heterogenous and     Shadowing                                              calcific                                   +----------+--------+--------+--------+---------------------+--------------  ----+  ICA Prox  401     108     60-79%  heterogenous and     end diastolic                                          irregular  velocities                                                                   slightly  increased                                                         compared to  prior                                                          exam                  +----------+--------+--------+--------+---------------------+--------------  ----+  ICA Mid   184     48              heterogenous                               +----------+--------+--------+--------+---------------------+--------------  ----+  ICA Distal121     39                                                          +----------+--------+--------+--------+---------------------+--------------  ----+  ECA       229     12      >50%    heterogenous                               +----------+--------+--------+--------+---------------------+--------------  ----+   +----------+--------+--------+----------------+-------------------+            PSV cm/sEDV cm/sDescribe        Arm Pressure (mmHG)  +----------+--------+--------+----------------+-------------------+  [id number]     0       Multiphasic, WNL                     +----------+--------+--------+----------------+-------------------+   +---------+--------+--+--------+--+---------+  VertebralPSV cm/s77EDV cm/s22Antegrade  +---------+--------+--+--------+--+---------+           Summary:  Right Carotid: Evidence consistent with a total occlusion of the right  ICA.                 Non-hemodynamically significant plaque <50% noted in the  CCA.   Left Carotid: Velocities in the left ICA are consistent with a 60-79%  stenosis.                Non-hemodynamically significant plaque <50% noted in the  CCA. The                ECA appears >50% stenosed.      Assessment/Plan:     70 year old female previous right carotid endarterectomy that subsequently occluded silently.  She is asymptomatic 79  to 70% stenosis of the left internal carotid artery that has remained stable over many years.  She does have a bruit there but I discussed with her that the velocities on the left could be falsely elevated due to the occlusion on the right.  Showed her end-diastolic velocity increase pushing her stenosis over 80% it would probably be prudent to first get CT angio for evaluation given the contralateral occlusion.  She will remain on aspirin, Plavix and a statin.  We discussed the signs and symptoms of stroke, TIA or amaurosis of which she is well aware.  She will follow-up in 1 year with repeat carotid duplex.     Waynetta Sandy MD [facility]

## 2021-02-10 ENCOUNTER — Other Ambulatory Visit: Payer: Self-pay

## 2021-02-10 ENCOUNTER — Ambulatory Visit: Payer: Medicare HMO | Admitting: Podiatry

## 2021-02-10 ENCOUNTER — Ambulatory Visit (INDEPENDENT_AMBULATORY_CARE_PROVIDER_SITE_OTHER): Payer: Medicare HMO | Admitting: Podiatry

## 2021-02-10 DIAGNOSIS — B351 Tinea unguium: Secondary | ICD-10-CM

## 2021-02-10 DIAGNOSIS — I739 Peripheral vascular disease, unspecified: Secondary | ICD-10-CM

## 2021-02-10 DIAGNOSIS — E1169 Type 2 diabetes mellitus with other specified complication: Secondary | ICD-10-CM

## 2021-02-10 DIAGNOSIS — E1151 Type 2 diabetes mellitus with diabetic peripheral angiopathy without gangrene: Secondary | ICD-10-CM

## 2021-02-10 NOTE — Progress Notes (Signed)
°  Subjective:  Patient ID: April Shaffer, female    DOB: 1950-07-12,  MRN: [id number]  Chief Complaint  Patient presents with   Nail Problem    Thick painful toenails, 3 month follow up   Peripheral Neuropathy    70 y.o. female presents with the above complaint. History confirmed with patient. States she has pain in both feet that work their way up her calves at night time.   PCP: Raymon Mutton., FNP; last seen   Objective:  Physical Exam: warm, good capillary refill, nail exam onychomycosis of the toenails, no trophic changes or ulcerative lesions. Feet slightly cool. Faint DP pulse, non-palp PT pulses   Hgb A1c MFr Bld  Date Value Ref Range Status  05/24/2020 6.4 (H) 4.8 - 5.6 % Final    Comment:    (NOTE) Pre diabetes:          5.7%-6.4%  Diabetes:              >6.4%  Glycemic control for   <7.0% adults with diabetes    No images are attached to the encounter.  Assessment:   1. PAD (peripheral artery disease) (HCC)   2. Onychomycosis of multiple toenails with type 2 diabetes mellitus and peripheral angiopathy (HCC)    Plan:  Patient was evaluated and treated and all questions answered.  Diabetes and PAD -Patient is diabetic with a qualifying condition for at risk foot care.  Procedure: Nail Debridement Type of Debridement: manual, sharp debridement. Instrumentation: Nail nipper, rotary burr. Number of Nails: 10  PAD -Order vascular studies. She does have a history of vascular issues including CAD, carotid stenosis with concomitant tobacco use. I do not see any lower extremity vascular studies.  No follow-ups on file.

## 2021-03-01 ENCOUNTER — Telehealth: Payer: Self-pay | Admitting: Podiatry

## 2021-03-01 NOTE — Telephone Encounter (Signed)
Pt called stating she reached out to [facility] and they stated they didn't have any information about seeing her. She would like to know if you can resend the referral.   Their fax number is: [phone]  Please advise.

## 2021-03-03 NOTE — Telephone Encounter (Signed)
Patient called today , stating that she still has not heard from [facility] to schedule referral.   I called [facility] and transferred the patient through.

## 2021-03-08 ENCOUNTER — Other Ambulatory Visit: Payer: Self-pay

## 2021-03-08 ENCOUNTER — Ambulatory Visit (HOSPITAL_COMMUNITY)
Admission: RE | Admit: 2021-03-08 | Discharge: 2021-03-08 | Disposition: A | Discharge status: Home / Self Care | Payer: Medicare HMO | Source: Ambulatory Visit | Attending: Podiatry | Admitting: Podiatry

## 2021-03-08 DIAGNOSIS — I739 Peripheral vascular disease, unspecified: Secondary | ICD-10-CM | POA: Insufficient documentation

## 2021-03-17 ENCOUNTER — Telehealth: Payer: Self-pay | Admitting: Podiatry

## 2021-03-17 NOTE — Telephone Encounter (Signed)
Patient called and stated that she had went to [facility] last week and wanted to know if Dr. Samuella Cota received her results.  Please advise

## 2021-03-20 NOTE — Telephone Encounter (Signed)
Pt notified and virtual appt set up/reb

## 2021-03-20 NOTE — Telephone Encounter (Signed)
You can tell her her results are mostly ok but I'd like to review them with her - can we get her a follow-up or a virtual visit?

## 2021-03-21 ENCOUNTER — Other Ambulatory Visit: Payer: Self-pay

## 2021-03-21 ENCOUNTER — Ambulatory Visit (INDEPENDENT_AMBULATORY_CARE_PROVIDER_SITE_OTHER): Payer: Medicare HMO | Admitting: Podiatry

## 2021-03-21 DIAGNOSIS — I739 Peripheral vascular disease, unspecified: Secondary | ICD-10-CM

## 2021-03-21 NOTE — Progress Notes (Signed)
Virtual Visit via Telephone Note  I connected with April Shaffer on 03/21/21 at  1:05 PM EST by telephone and verified that I am speaking with the correct person using two identifiers.  Location: Patient: Patient's home Provider: [facility]    I discussed the limitations, risks, security and privacy concerns of performing an evaluation and management service by telephone and the availability of in person appointments. I also discussed with the patient that there may be a patient responsible charge related to this service. The patient expressed understanding and agreed to proceed.  History of Present Illness: Patient presents with virtual visit today for review of her vascular studies that were performed.  See results below.  She continues to complain of pain at night but no skin breakdown   Observations/Objective: Patient complains of night pain especially in her lower extremities on the left as well  LOWER EXTREMITY DOPPLER STUDY   Patient Name:  April Shaffer  Date of Exam:   03/08/2021  Medical Rec #: [id number]         Accession #:    [id number]  Date of Birth: 1950-10-06          Patient Gender: F  Patient Age:   71 years  Exam Location:  [facility]  Procedure:      VAS [place] ABI WITH/WO TBI  Referring Phys: Javonta Gronau    ---------------------------------------------------------------------------  -----     Indications: Peripheral artery disease.   High Risk Factors: Hypertension, hyperlipidemia, Diabetes, current smoker.   Other Factors: Back surgery x 3, last one October 2022. Patient complains  of                 left toe nail discoloration.   Performing Technologist: Elita Quick RVT      Examination Guidelines: A complete evaluation includes at minimum, Doppler  waveform signals and systolic blood pressure reading at the level of  bilateral  brachial, anterior tibial, and posterior tibial arteries, when vessel  segments  are  accessible. Bilateral testing is considered an integral part of a  complete  examination. Photoelectric Plethysmograph (PPG) waveforms and toe systolic  pressure readings are included as required and additional duplex testing  as  needed. Limited examinations for reoccurring indications may be performed  as  noted.      ABI Findings:  +---------+------------------+-----+----------+--------+   Right     Rt Pressure (mmHg) Index Waveform   Comment    +---------+------------------+-----+----------+--------+   Brachial  145                                            +---------+------------------+-----+----------+--------+   PTA       107                0.74  monophasic Brisk      +---------+------------------+-----+----------+--------+   DP        91                 0.63  monophasic brisk      +---------+------------------+-----+----------+--------+   Great Toe 68                 0.47  Abnormal              +---------+------------------+-----+----------+--------+   +---------+------------------+-----+--------+-------+   Left      Lt Pressure (mmHg) Index  Waveform Comment   +---------+------------------+-----+--------+-------+   Brachial  143                                         +---------+------------------+-----+--------+-------+   PTA       176                1.21  biphasic           +---------+------------------+-----+--------+-------+   DP        153                1.06  biphasic           +---------+------------------+-----+--------+-------+   Great Toe 95                 0.66  Abnormal           +---------+------------------+-----+--------+-------+   +-------+-----------+-----------+------------+------------+   ABI/TBI Today's ABI Today's TBI Previous ABI Previous TBI   +-------+-----------+-----------+------------+------------+   Right   0.74        0.47                                    +-------+-----------+-----------+------------+------------+   Left    1.21         0.66                                    +-------+-----------+-----------+------------+------------+     Summary:  Right: Resting right ankle-brachial index indicates moderate right lower  extremity arterial disease. The right toe-brachial index is abnormal.   Left: Resting left ankle-brachial index is within normal range. No  evidence of significant left lower extremity arterial disease. The left  toe-brachial index is abnormal.   Assessment and Plan: Moderate PAD, symptomatic.  Follow Up Instructions: Will refer her to Dr. Randie Heinz for evaluation on nonemergent basis   I discussed the assessment and treatment plan with the patient. The patient was provided an opportunity to ask questions and all were answered. The patient agreed with the plan and demonstrated an understanding of the instructions.   The patient was advised to call back or seek an in-person evaluation if the symptoms worsen or if the condition fails to improve as anticipated.  I provided 7 minutes of non-face-to-face time during this encounter.   Park Liter, DPM

## 2021-04-26 ENCOUNTER — Ambulatory Visit (INDEPENDENT_AMBULATORY_CARE_PROVIDER_SITE_OTHER): Payer: Medicare HMO | Admitting: Vascular Surgery

## 2021-04-26 ENCOUNTER — Other Ambulatory Visit: Payer: Self-pay

## 2021-04-26 ENCOUNTER — Encounter: Payer: Self-pay | Admitting: Vascular Surgery

## 2021-04-26 VITALS — BP 161/83 | HR 93 | Temp 98.1°F | Resp 20 | Ht 63.0 in | Wt 129.0 lb

## 2021-04-26 DIAGNOSIS — I739 Peripheral vascular disease, unspecified: Secondary | ICD-10-CM

## 2021-04-26 NOTE — Progress Notes (Signed)
Patient ID: April Shaffer, female   DOB: 04-10-1950, 70 y.o.   MRN: [id number]  Reason for Consult: Follow-up   Referred by Sonia Side., FNP  Subjective:     HPI:  April Shaffer is a 71 y.o. female I have previously evaluated for right carotid endarterectomy site occlusion and left ICA stenosis.  She is following with [place] for that.  She denies pain in her right foot due to some bony issues and she has been evaluated by podiatry and found to have decreased ABI on that side.  She states that she has pain both during the day but worse at night across the forefoot.  She does not have any skin changes or ulceration.  She is able to walk but slowly she is currently in a wheelchair.  This is mostly due to left hip pain she and she is being scheduled for a left hip replacement.  She is compliant with her aspirin and Plavix.  No recent stroke, TIA or amaurosis.  Past Medical History:  Diagnosis Date   Arthritis    Carotid artery narrowing    Coronary artery disease    Cardiac catheterization November 2013: 50% ostial LAD stenosis 50% mid stenosis. 30% disease in the left circumflex.   Diabetes mellitus, type 2 ([place])    Hyperlipidemia    Hypertension    Onychomycosis of toenail 07/31/2016   Sleep apnea       Had surgery to correct   Family History  Problem Relation Age of Onset   Hypertension Sister    Hypothyroidism Sister    Cancer Maternal Grandmother        lung   Hypertension Son    Hypertension Sister    Diabetes Brother        lost toe in 2018 with new diagnosis of DM   Hypertension Son    Past Surgical History:  Procedure Laterality Date   ABDOMINAL HYSTERECTOMY     APPLICATION OF INTRAOPERATIVE CT SCAN N/A 11/30/2020   Procedure: APPLICATION OF INTRAOPERATIVE CT SCAN;  Surgeon: Eustace Moore, MD;  Location: [place];  Service: Neurosurgery;  Laterality: N/A;   BACK SURGERY     CARDIAC CATHETERIZATION     CATARACT EXTRACTION, BILATERAL Bilateral 2021   Dr. Sherral Hammers    ENDARTERECTOMY  09/27/2011   Procedure: RIGT ENDARTERECTOMY CAROTID;  Surgeon: Mal Misty, MD;  Location: [place];  Service: Vascular;  Laterality: Right;   EPIDURAL BLOCK INJECTION  02/2008   Drs. Eulis Manly   gyn surgery  2004   total hysterectomy for mennorhagia,,salpingoophorectomy   LAMINECTOMY WITH POSTERIOR LATERAL ARTHRODESIS LEVEL 2 N/A 11/30/2020   Procedure: LUMBAR FOUR-FIVE, LUMBAR FIVE-SACRAL ONE POSTERIOR LATERAL FUSION WITH REVISION OF LUMBAR ONE-FIVE HARDWARE AND EXTENSION TO SACRAL ONE AND SACRAL TWO;  Surgeon: Eustace Moore, MD;  Location: [place];  Service: Neurosurgery;  Laterality: N/A;   LUMBAR WOUND DEBRIDEMENT N/A 05/25/2020   Procedure: LUMBAR WOUND IRRIGATION AND DEBRIDEMENT;  Surgeon: Eustace Moore, MD;  Location: [place];  Service: Neurosurgery;  Laterality: N/A;   POSTERIOR LUMBAR FUSION 4 LEVEL N/A 04/25/2020   Procedure: POSTERIOR LUMBAR INTERBODY FUSION LUMBAR ONE-TWO, LUMBAR TWO-THREE, LUMBAR THREE-FOUR,LUMBAR FOUR-FIVE.;  Surgeon: Eustace Moore, MD;  Location: [place];  Service: Neurosurgery;  Laterality: N/A;  posterior   TONSILLECTOMY     TOTAL ABDOMINAL HYSTERECTOMY W/ BILATERAL SALPINGOOPHORECTOMY Bilateral 2000   TOTAL HIP ARTHROPLASTY Right 08/22/2018   Procedure: TOTAL HIP ARTHROPLASTY;  Surgeon: French Ana,  Quillian Quince, MD;  Location: [facility];  Service: Orthopedics;  Laterality: Right;   VARICOSE VEIN SURGERY  2008   stripping    Short Social History:  Social History   Tobacco Use   Smoking status: Former    Packs/day: 0.10    Years: 25.00    Pack years: 2.50    Types: Cigarettes    Start date: 05/31/1968    Quit date: 11/30/2020    Years since quitting: 0.4   Smokeless tobacco: Never   Tobacco comments:    pt states she smokes about 4-5 cigs per day--states she contiues to work on it.  No interest in other help now.  Substance Use Topics   Alcohol use: Yes    Comment: rare    Allergies  Allergen Reactions    Lisinopril-Hydrochlorothiazide Swelling    Angioedema  - Tongue swelling    Codeine Itching   Oxycodone Itching   Simvastatin Other (See Comments)    Muscle pain   Tramadol Itching    Current Outpatient Medications  Medication Sig Dispense Refill   allopurinol (ZYLOPRIM) 300 MG tablet Take 1 tablet (300 mg total) by mouth daily. 90 tablet 3   amLODipine (NORVASC) 10 MG tablet Take 10 mg by mouth daily.     amLODipine (NORVASC) 5 MG tablet Take 1 tablet (5 mg total) by mouth 2 (two) times daily. 60 tablet 1   aspirin EC 81 MG tablet Take 81 mg by mouth daily.     atorvastatin (LIPITOR) 80 MG tablet TAKE 1 TABLET BY MOUTH ONCE DAILY WITH  EVENING  MEAL (Patient taking differently: Take 80 mg by mouth in the morning.) 90 tablet 3   blood glucose meter kit and supplies KIT Check sugars twice daily 1 each 0   carvedilol (COREG CR) 20 MG 24 hr capsule Take 1 capsule (20 mg total) by mouth daily. 90 capsule 3   clopidogrel (PLAVIX) 75 MG tablet Take 75 mg by mouth daily.     ezetimibe (ZETIA) 10 MG tablet TAKE 1 TABLET EVERY DAY (NEED MD APPOINTMENT) 90 tablet 0   gabapentin (NEURONTIN) 300 MG capsule 1 cap by mouth in morning, 1 cap by mouth midday and 2 caps by mouth before bedtime. 360 capsule 3   gabapentin (NEURONTIN) 600 MG tablet Take 600 mg by mouth in the morning and at bedtime.     glucose blood test strip Check sugars twice daily 100 each 11   metFORMIN (GLUCOPHAGE) 500 MG tablet Take 500 mg by mouth 2 (two) times daily with a meal.     methocarbamol (ROBAXIN) 500 MG tablet Take 1 tablet (500 mg total) by mouth 4 (four) times daily. 45 tablet 0   methocarbamol (ROBAXIN) 500 MG tablet Take 1 tablet by mouth every 6 (six) hours as needed.     MITIGARE 0.6 MG CAPS TAKE 1 CAPSULE TWICE DAILY AS NEEDED FOR GOUT FLARE AND HOLD ATORVASTATIN ON DAYS TAKING MITIGARE 30 capsule 1   ondansetron (ZOFRAN-ODT) 4 MG disintegrating tablet Take 1 tablet (4 mg total) by mouth 2 (two) times daily. 30  minutes prior to antibiotic 30 tablet 0   oxyCODONE-acetaminophen (PERCOCET) 10-325 MG tablet Take 1 tablet by mouth every 4 (four) hours as needed for pain. 40 tablet 0   oxyCODONE-acetaminophen (PERCOCET/ROXICET) 5-325 MG tablet Take by mouth.     No current facility-administered medications for this visit.    Review of Systems  Constitutional:  Constitutional negative. HENT: HENT negative.  Eyes: Eyes negative.  Respiratory: Respiratory negative.  Cardiovascular: Cardiovascular negative.  GI: Gastrointestinal negative.  Musculoskeletal: Positive for leg pain and joint pain.  Neurological: Neurological negative. Hematologic: Hematologic/lymphatic negative.  Psychiatric: Psychiatric negative.       Objective:  Objective   Vitals:   04/26/21 0833  BP: (!) 161/83  Pulse: 93  Resp: 20  Temp: 98.1 F (36.7 C)  SpO2: 99%  Weight: 129 lb (58.5 kg)  Height: _0  (1.6 m)   Body mass index is 22.85 kg/m.  Physical Exam HENT:     Head: Normocephalic.     Nose:     Comments: Wearing a mask Eyes:     Pupils: Pupils are equal, round, and reactive to light.  Neck:     Vascular: Carotid bruit present.  Cardiovascular:     Pulses:          Radial pulses are 2+ on the right side and 2+ on the left side.       Femoral pulses are 0 on the right side and 2+ on the left side. Pulmonary:     Effort: Pulmonary effort is normal.  Abdominal:     General: Abdomen is flat.     Palpations: Abdomen is soft. There is no mass.  Musculoskeletal:        General: Normal range of motion.     Right lower leg: No edema.     Left lower leg: No edema.  Skin:    General: Skin is warm.     Capillary Refill: Capillary refill takes less than 2 seconds.  Neurological:     General: No focal deficit present.     Mental Status: She is alert.  Psychiatric:        Mood and Affect: Mood normal.        Behavior: Behavior normal.        Thought Content: Thought content normal.    Data: ABI  Findings:  +---------+------------------+-----+----------+--------+   Right     Rt Pressure (mmHg) Index Waveform   Comment    +---------+------------------+-----+----------+--------+   Brachial  145                                            +---------+------------------+-----+----------+--------+   PTA       107                0.74  monophasic Brisk      +---------+------------------+-----+----------+--------+   DP        91                 0.63  monophasic brisk      +---------+------------------+-----+----------+--------+   Great Toe 68                 0.47  Abnormal              +---------+------------------+-----+----------+--------+   +---------+------------------+-----+--------+-------+   Left      Lt Pressure (mmHg) Index Waveform Comment   +---------+------------------+-----+--------+-------+   Brachial  143                                         +---------+------------------+-----+--------+-------+   PTA       176  1.21  biphasic           +---------+------------------+-----+--------+-------+   DP        153                1.06  biphasic           +---------+------------------+-----+--------+-------+   Great Toe 95                 0.66  Abnormal           +---------+------------------+-----+--------+-------+   +-------+-----------+-----------+------------+------------+   ABI/TBI Today's ABI Today's TBI Previous ABI Previous TBI   +-------+-----------+-----------+------------+------------+   Right   0.74        0.47                                    +-------+-----------+-----------+------------+------------+   Left    1.21        0.66                                    +-------+-----------+-----------+------------+------------+          Assessment/Plan:     71 year old female with known carotid occlusion and left ICA stenosis with a known bruit.  She now has right foot pain that appears to be multifactorial in nature but does have decreased ABI on that  side with moderately decreased toe pressures.  Her foot does appear well perfused but her pain is somewhat concerning for vascular nature as one of the underlying causes.  Currently she has been worked up for left hip replacement and will need to hold her aspirin Plavix at that time.  I discussed that since she does not appear to be with any limb threatening ischemia at this time we will hold on any intervention until she is healed up from her hip replacement so that we do not need to hold antiplatelet therapies after stenting or balloon angioplasty.  I am somewhat concerned she has common femoral disease on the right with inability to palpate a pulse today.  In 3 months I will follow her up with right lower extremity duplex and ABIs.  She demonstrates good understanding of the plan.     Waynetta Sandy MD [facility]

## 2021-04-28 ENCOUNTER — Other Ambulatory Visit: Payer: Self-pay | Admitting: *Deleted

## 2021-04-28 DIAGNOSIS — I739 Peripheral vascular disease, unspecified: Secondary | ICD-10-CM

## 2021-05-03 ENCOUNTER — Ambulatory Visit (INDEPENDENT_AMBULATORY_CARE_PROVIDER_SITE_OTHER): Payer: Medicare HMO | Admitting: Nurse Practitioner

## 2021-05-03 ENCOUNTER — Telehealth: Payer: Self-pay | Admitting: *Deleted

## 2021-05-03 ENCOUNTER — Telehealth: Payer: Self-pay

## 2021-05-03 ENCOUNTER — Encounter: Payer: Self-pay | Admitting: Nurse Practitioner

## 2021-05-03 DIAGNOSIS — Z01810 Encounter for preprocedural cardiovascular examination: Secondary | ICD-10-CM | POA: Diagnosis not present

## 2021-05-03 NOTE — Telephone Encounter (Signed)
? ?  Pre-operative Risk Assessment  ?  ?Patient Name: [name]  ?DOB: [birth year] ?MRN: [id number] ? ? ?  ? ?Request for Surgical Clearance ? ?Procedure:   Left Total Hip Replacement ? ?Date of Surgery:  Clearance TBD                              ? ?Surgeon:  Frederico Hamman, MD ?Surgeon's Group or Practice Name:  [facility] ?Phone number:  [phone] [address]) ?Fax number:  [phone] ? ? ?Type of Clearance Requested:   ?- Medical  ?- Pharmacy:  Hold Aspirin and Clopidogrel (Plavix) 5-7 days ? ?Type of Anesthesia:  Spinal ?  ?Additional requests/questions:   N/A ? ?Signed, ?Kandice Robinsons T   ?05/03/2021, 12:35 PM  ? ?

## 2021-05-03 NOTE — Telephone Encounter (Signed)
Pt has been scheduled for telephone visit today, 05/04/30, 3:40. ? ?Medications has been reconciled ? ?  ?Patient Consent for Virtual Visit  ? ? ?   ? ?April Shaffer has provided verbal consent on 05/03/2021 for a virtual visit (video or telephone). ? ? ?CONSENT FOR VIRTUAL VISIT FOR:  April Shaffer  ?By participating in this virtual visit I agree to the following: ? ?I hereby voluntarily request, consent and authorize [facility] and its employed or contracted physicians, physician assistants, nurse practitioners or other licensed health care professionals (the Practitioner), to provide me with telemedicine health care services (the ?Services") as deemed necessary by the treating Practitioner. I acknowledge and consent to receive the Services by the Practitioner via telemedicine. I understand that the telemedicine visit will involve communicating with the Practitioner through live audiovisual communication technology and the disclosure of certain medical information by electronic transmission. I acknowledge that I have been given the opportunity to request an in-person assessment or other available alternative prior to the telemedicine visit and am voluntarily participating in the telemedicine visit. ? ?I understand that I have the right to withhold or withdraw my consent to the use of telemedicine in the course of my care at any time, without affecting my right to future care or treatment, and that the Practitioner or I may terminate the telemedicine visit at any time. I understand that I have the right to inspect all information obtained and/or recorded in the course of the telemedicine visit and may receive copies of available information for a reasonable fee.  I understand that some of the potential risks of receiving the Services via telemedicine include:  ?Delay or interruption in medical evaluation due to technological equipment failure or disruption; ?Information transmitted may not be sufficient (e.g.  poor resolution of images) to allow for appropriate medical decision making by the Practitioner; and/or  ?In rare instances, security protocols could fail, causing a breach of personal health information. ? ?Furthermore, I acknowledge that it is my responsibility to provide information about my medical history, conditions and care that is complete and accurate to the best of my ability. I acknowledge that Practitioner's advice, recommendations, and/or decision may be based on factors not within their control, such as incomplete or inaccurate data provided by me or distortions of diagnostic images or specimens that may result from electronic transmissions. I understand that the practice of medicine is not an exact science and that Practitioner makes no warranties or guarantees regarding treatment outcomes. I acknowledge that a copy of this consent can be made available to me via my patient portal [facility] MyChart), or I can request a printed copy by calling the office of [facility].   ? ?I understand that my insurance will be billed for this visit.  ? ?I have read or had this consent read to me. ?I understand the contents of this consent, which adequately explains the benefits and risks of the Services being provided via telemedicine.  ?I have been provided ample opportunity to ask questions regarding this consent and the Services and have had my questions answered to my satisfaction. ?I give my informed consent for the services to be provided through the use of telemedicine in my medical care ? ? ? ?

## 2021-05-03 NOTE — Telephone Encounter (Signed)
Pt has been scheduled for telephone visit today, 05/03/21.  ?Consent / medication reconciliation on file.  ?

## 2021-05-03 NOTE — Progress Notes (Signed)
? ?Virtual Visit via Telephone Note  ? ?This visit type was conducted due to national recommendations for restrictions regarding the COVID-19 Pandemic (e.g. social distancing) in an effort to limit this patient's exposure and mitigate transmission in our community.  Due to her co-morbid illnesses, this patient is at least at moderate risk for complications without adequate follow up.  This format is felt to be most appropriate for this patient at this time.  The patient did not have access to video technology/had technical difficulties with video requiring transitioning to audio format only (telephone).  All issues noted in this document were discussed and addressed.  No physical exam could be performed with this format.  Please refer to the patient's chart for her  consent to telehealth for [facility]. ?Evaluation Performed:  Preoperative cardiovascular risk assessment ? ?This visit type was conducted due to national recommendations for restrictions regarding the COVID-19 Pandemic (e.g. social distancing).  This format is felt to be most appropriate for this patient at this time.  All issues noted in this document were discussed and addressed.  No physical exam was performed (except for noted visual exam findings with Video Visits).  Please refer to the patient's chart (MyChart message for video visits and phone note for telephone visits) for the patient's consent to telehealth for [facility]. ?_____________  ? ?Date:  05/03/2021  ? ?Patient ID:  April Shaffer, April Shaffer 1950/06/14, MRN [id number] ?Patient Location:  ?Home ?Provider location:   ?Office ? ?Primary Care Provider:  Sonia Side., FNP ?Primary Cardiologist:  Quay Burow, MD ? ?Chief Complaint  ?  ?71 y.o. y/o female with a h/o PAD, hyperlipidemia, hypertension, tobacco abuse, diabetes, non-obstructive CAD who is pending Left Total Hip Replacement, and presents today for telephonic preoperative cardiovascular risk assessment. ? ?Past Medical  History  ?  ?Past Medical History:  ?Diagnosis Date  ? Arthritis   ? Carotid artery narrowing   ? Coronary artery disease   ? Cardiac catheterization November 2013: 50% ostial LAD stenosis 50% mid stenosis. 30% disease in the left circumflex.  ? Diabetes mellitus, type 2 ([place])   ? Hyperlipidemia   ? Hypertension   ? Onychomycosis of toenail 07/31/2016  ? Sleep apnea   ?    Had surgery to correct  ? ?Past Surgical History:  ?Procedure Laterality Date  ? ABDOMINAL HYSTERECTOMY    ? APPLICATION OF INTRAOPERATIVE CT SCAN N/A 11/30/2020  ? Procedure: APPLICATION OF INTRAOPERATIVE CT SCAN;  Surgeon: Eustace Moore, MD;  Location: [place];  Service: Neurosurgery;  Laterality: N/A;  ? BACK SURGERY    ? CARDIAC CATHETERIZATION    ? CATARACT EXTRACTION, BILATERAL Bilateral 2021  ? Dr. Sherral Hammers  ? ENDARTERECTOMY  09/27/2011  ? Procedure: RIGT ENDARTERECTOMY CAROTID;  Surgeon: Mal Misty, MD;  Location: [place];  Service: Vascular;  Laterality: Right;  ? EPIDURAL BLOCK INJECTION  02/2008  ? Drs. Eulis Manly  ? gyn surgery  2004  ? total hysterectomy for mennorhagia,,salpingoophorectomy  ? LAMINECTOMY WITH POSTERIOR LATERAL ARTHRODESIS LEVEL 2 N/A 11/30/2020  ? Procedure: LUMBAR FOUR-FIVE, LUMBAR FIVE-SACRAL ONE POSTERIOR LATERAL FUSION WITH REVISION OF LUMBAR ONE-FIVE HARDWARE AND EXTENSION TO SACRAL ONE AND SACRAL TWO;  Surgeon: Eustace Moore, MD;  Location: [place];  Service: Neurosurgery;  Laterality: N/A;  ? LUMBAR WOUND DEBRIDEMENT N/A 05/25/2020  ? Procedure: LUMBAR WOUND IRRIGATION AND DEBRIDEMENT;  Surgeon: Eustace Moore, MD;  Location: [place];  Service: Neurosurgery;  Laterality: N/A;  ? POSTERIOR  LUMBAR FUSION 4 LEVEL N/A 04/25/2020  ? Procedure: POSTERIOR LUMBAR INTERBODY FUSION LUMBAR ONE-TWO, LUMBAR TWO-THREE, LUMBAR THREE-FOUR,LUMBAR FOUR-FIVE.;  Surgeon: Eustace Moore, MD;  Location: [place];  Service: Neurosurgery;  Laterality: N/A;  posterior  ? TONSILLECTOMY    ? TOTAL ABDOMINAL HYSTERECTOMY W/ BILATERAL  SALPINGOOPHORECTOMY Bilateral 2000  ? TOTAL HIP ARTHROPLASTY Right 08/22/2018  ? Procedure: TOTAL HIP ARTHROPLASTY;  Surgeon: Earlie Server, MD;  Location: [facility];  Service: Orthopedics;  Laterality: Right;  ? VARICOSE VEIN SURGERY  2008  ? stripping  ? ? ?Allergies ? ?Allergies  ?Allergen Reactions  ? Lisinopril-Hydrochlorothiazide Swelling  ?  Angioedema  - Tongue swelling   ? Codeine Itching  ? Oxycodone Itching  ? Simvastatin Other (See Comments)  ?  Muscle pain  ? Tramadol Itching  ? ? ?History of Present Illness  ?  ?April Shaffer is a 71 y.o. female who presents via audio/video conferencing for a telehealth visit today.  Pt was last seen in cardiology clinic on 12/20/20, by Dr. Gwenlyn Found.  At that time [name] was doing well.  She is now pending Left total hip replacement.  Since his last visit, she denies chest pain, dyspnea, edema, presyncope, or syncope. She is limited physically by her hip pain. She has no specific cardiac complaints. She ambulates with a walker. No bleeding concerns.  ? ? ?Home Medications  ?  ?Prior to Admission medications   ?Medication Sig Start Date End Date Taking? Authorizing Provider  ?allopurinol (ZYLOPRIM) 300 MG tablet Take 1 tablet (300 mg total) by mouth daily. 07/09/19   Mack Hook, MD  ?amLODipine (NORVASC) 10 MG tablet Take 10 mg by mouth daily.    [provider]  ?aspirin EC 81 MG tablet Take 81 mg by mouth daily.    [provider]  ?atorvastatin (LIPITOR) 80 MG tablet TAKE 1 TABLET BY MOUTH ONCE DAILY WITH  EVENING  MEAL ?Patient taking differently: Take 80 mg by mouth in the morning. 07/09/19   Mack Hook, MD  ?blood glucose meter kit and supplies KIT Check sugars twice daily 06/10/18   Mack Hook, MD  ?carvedilol (COREG CR) 20 MG 24 hr capsule Take 1 capsule (20 mg total) by mouth daily. 07/09/19   Mack Hook, MD  ?clopidogrel (PLAVIX) 75 MG tablet Take 75 mg by mouth daily.    [provider]   ?ezetimibe (ZETIA) 10 MG tablet TAKE 1 TABLET EVERY DAY (NEED MD APPOINTMENT) 09/27/20   Lorretta Harp, MD  ?gabapentin (NEURONTIN) 300 MG capsule 1 cap by mouth in morning, 1 cap by mouth midday and 2 caps by mouth before bedtime. ?Patient taking differently: 300 mg 3 (three) times daily. 06/10/19   Mack Hook, MD  ?glucose blood test strip Check sugars twice daily 01/31/16   Mack Hook, MD  ?metFORMIN (GLUCOPHAGE) 500 MG tablet Take 500 mg by mouth 2 (two) times daily with a meal. 04/08/20   [provider]  ?oxyCODONE-acetaminophen (PERCOCET) 10-325 MG tablet Take 1 tablet by mouth every 4 (four) hours as needed for pain. 12/03/20   Eustace Moore, MD  ? ? ?Physical Exam  ?  ?Vital Signs:  KEIDRA WITHERS does not have vital signs available for review today. ? ?Given telephonic nature of communication, physical exam is limited. ?AAOx3. NAD. Normal affect.  Speech and respirations are unlabored. ? ?Accessory Clinical Findings  ?  ?None ? ?Assessment & Plan  ?  ?1.  Preoperative Cardiovascular Risk Assessment: Patient has  no specific cardiac concerns. She may proceed to surgery without further cardiac testing. According to the Revised Cardiac Risk Index (RCRI), her Perioperative Risk of Major Cardiac Event is (%): 6.6. Her Functional Capacity in METs is: 4.64 according to the Duke Activity Status Index (DASI). ? ? ?COVID-19 Education: ?The signs and symptoms of COVID-19 were discussed with the patient and how to seek care for testing (follow up with PCP or arrange E-visit).  The importance of social distancing was discussed today. ? ?Patient Risk:   ?After full review of this patient's history and clinical status, I feel that he is at least moderate risk for cardiac complications at this time, thus necessitating a telehealth visit sooner than our first available in office visit. ? ?Time:   ?Today, I have spent 10 minutes with the patient with telehealth technology discussing medical  history, symptoms, and management plan.   ? ? ?Emmaline Life, NP-C ? ?  ?05/03/2021, 3:35 PM ?[facility] ?[address] ?Office [phone] Fax [phone] ? ?

## 2021-05-03 NOTE — Telephone Encounter (Signed)
Primary Cardiologist:April Allyson Sabal, MD ? ?Chart reviewed as part of pre-operative protocol coverage. Because of April Shaffer's past medical history and time since last visit, he/she will require a virtual visit/telephone call in order to better assess preoperative cardiovascular risk. ? ?Pre-op covering staff: ?- Please contact patient, obtain consent, and schedule appointment  ? ?Clearance regarding aspirin and Plavix has been addressed by Dr. Randie Heinz, VVS on 04/26/21.  ? ?April Aland, NP-C ? ?  ?05/03/2021, 1:49 PM ?[facility] ?[address], [address] ?Office [phone] Fax [phone] ? ?

## 2021-05-22 ENCOUNTER — Other Ambulatory Visit (HOSPITAL_COMMUNITY): Payer: Self-pay | Admitting: Neurological Surgery

## 2021-05-25 ENCOUNTER — Ambulatory Visit: Payer: Self-pay | Admitting: Physician Assistant

## 2021-05-25 DIAGNOSIS — G8929 Other chronic pain: Secondary | ICD-10-CM

## 2021-05-25 NOTE — H&P (Signed)
TOTAL HIP ADMISSION H&P ? ?Patient is admitted for left total hip arthroplasty. ? ?Subjective: ? ?Chief Complaint: left hip pain ? ?HPI: April Shaffer, 70 y.o. female, has a history of pain and functional disability in the left hip(s) due to arthritis and patient has failed non-surgical conservative treatments for greater than 12 weeks to include NSAID's and/or analgesics, corticosteriod injections, use of assistive devices, and activity modification.  Onset of symptoms was gradual starting 9 years ago with gradually worsening course since that time.The patient noted no past surgery on the left hip(s).  Patient currently rates pain in the left hip at 8 out of 10 with activity. Patient has night pain, worsening of pain with activity and weight bearing, trendelenberg gait, pain that interfers with activities of daily living, and pain with passive range of motion. Patient has evidence of periarticular osteophytes and joint space narrowing by imaging studies. This condition presents safety issues increasing the risk of falls.  There is no current active infection. ? ?Patient Active Problem List  ? Diagnosis Date Noted  ? Medication monitoring encounter 12/12/2020  ? Nausea 12/12/2020  ? Hammer toes of both feet 11/09/2020  ? Acute maxillary sinusitis 08/08/2020  ? Hyperlipidemia 08/08/2020  ? Pain due to onychomycosis of toenails of both feet 08/08/2020  ? Therapeutic drug monitoring 06/22/2020  ? Wound infection after surgery 05/25/2020  ? Wound drainage 05/24/2020  ? Local infection of the skin and subcutaneous tissue, unspecified 05/24/2020  ? Body mass index (BMI) 27.0-27.9, adult 05/10/2020  ? S/P lumbar fusion 04/25/2020  ? Microalbuminuria due to type 2 diabetes mellitus ([place]) 08/07/2019  ? Primary localized osteoarthritis of right hip 08/22/2018  ? Carpal tunnel syndrome, bilateral 08/06/2018  ? Type 2 diabetes mellitus with peripheral neuropathy ([place]) 08/06/2018  ? Degenerative joint disease of right hip  08/06/2018  ? Preoperative clearance 03/26/2018  ? Upper back pain on left side 10/30/2016  ? Encounter for postoperative carotid endarterectomy surveillance 12/07/2015  ? Varicosities of leg 06/03/2015  ? Acute left lumbar radiculopathy 04/15/2014  ? Chronic sciatica of right side 03/17/2013  ? Diabetic peripheral neuropathy ([place]) 03/17/2013  ? Coronary artery disease   ? Carotid artery stenosis s/p CEA 2013 10/16/2011  ? Carotid artery occlusion without infarction, right 09/17/2011  ? Carotid bruit 08/28/2011  ? Gout 04/13/2011  ? DM type 2 with diabetic peripheral neuropathy ([place]) 04/02/2008  ? Hyperlipidemia associated with type 2 diabetes mellitus ([place]) 04/02/2008  ? Essential hypertension 04/02/2008  ? [place] DISEASE, LUMBOSACRAL SPINE 04/02/2008  ? ?Past Medical History:  ?Diagnosis Date  ? Arthritis   ? Carotid artery narrowing   ? Coronary artery disease   ? Cardiac catheterization November 2013: 50% ostial LAD stenosis 50% mid stenosis. 30% disease in the left circumflex.  ? Diabetes mellitus, type 2 ([place])   ? Hyperlipidemia   ? Hypertension   ? Onychomycosis of toenail 07/31/2016  ? Sleep apnea   ?    Had surgery to correct  ?  ?Past Surgical History:  ?Procedure Laterality Date  ? ABDOMINAL HYSTERECTOMY    ? APPLICATION OF INTRAOPERATIVE CT SCAN N/A 11/30/2020  ? Procedure: APPLICATION OF INTRAOPERATIVE CT SCAN;  Surgeon: Eustace Moore, MD;  Location: [facility];  Service: Neurosurgery;  Laterality: N/A;  ? BACK SURGERY    ? CARDIAC CATHETERIZATION    ? CATARACT EXTRACTION, BILATERAL Bilateral 2021  ? Dr. Sherral Hammers  ? ENDARTERECTOMY  09/27/2011  ? Procedure: RIGT ENDARTERECTOMY CAROTID;  Surgeon: Mal Misty,  MD;  Location: [facility];  Service: Vascular;  Laterality: Right;  ? EPIDURAL BLOCK INJECTION  02/2008  ? Drs. Eulis Manly  ? gyn surgery  2004  ? total hysterectomy for mennorhagia,,salpingoophorectomy  ? LAMINECTOMY WITH POSTERIOR LATERAL ARTHRODESIS LEVEL 2 N/A 11/30/2020  ? Procedure:  LUMBAR FOUR-FIVE, LUMBAR FIVE-SACRAL ONE POSTERIOR LATERAL FUSION WITH REVISION OF LUMBAR ONE-FIVE HARDWARE AND EXTENSION TO SACRAL ONE AND SACRAL TWO;  Surgeon: Eustace Moore, MD;  Location: [facility];  Service: Neurosurgery;  Laterality: N/A;  ? LUMBAR WOUND DEBRIDEMENT N/A 05/25/2020  ? Procedure: LUMBAR WOUND IRRIGATION AND DEBRIDEMENT;  Surgeon: Eustace Moore, MD;  Location: [place] [facility];  Service: Neurosurgery;  Laterality: N/A;  ? POSTERIOR LUMBAR FUSION 4 LEVEL N/A 04/25/2020  ? Procedure: POSTERIOR LUMBAR INTERBODY FUSION LUMBAR ONE-TWO, LUMBAR TWO-THREE, LUMBAR THREE-FOUR,LUMBAR FOUR-FIVE.;  Surgeon: Eustace Moore, MD;  Location: [facility];  Service: Neurosurgery;  Laterality: N/A;  posterior  ? TONSILLECTOMY    ? TOTAL ABDOMINAL HYSTERECTOMY W/ BILATERAL SALPINGOOPHORECTOMY Bilateral 2000  ? TOTAL HIP ARTHROPLASTY Right 08/22/2018  ? Procedure: TOTAL HIP ARTHROPLASTY;  Surgeon: Earlie Server, MD;  Location: [facility];  Service: Orthopedics;  Laterality: Right;  ? VARICOSE VEIN SURGERY  2008  ? stripping  ?  ?Current Outpatient Medications  ?Medication Sig Dispense Refill Last Dose  ? allopurinol (ZYLOPRIM) 300 MG tablet Take 1 tablet (300 mg total) by mouth daily. 90 tablet 3   ? amLODipine (NORVASC) 10 MG tablet Take 10 mg by mouth daily.     ? aspirin EC 81 MG tablet Take 81 mg by mouth daily.     ? atorvastatin (LIPITOR) 80 MG tablet TAKE 1 TABLET BY MOUTH ONCE DAILY WITH  EVENING  MEAL (Patient taking differently: Take 80 mg by mouth in the morning.) 90 tablet 3   ? blood glucose meter kit and supplies KIT Check sugars twice daily 1 each 0   ? carvedilol (COREG CR) 20 MG 24 hr capsule Take 1 capsule (20 mg total) by mouth daily. 90 capsule 3   ? clopidogrel (PLAVIX) 75 MG tablet Take 75 mg by mouth daily.     ? ezetimibe (ZETIA) 10 MG tablet TAKE 1 TABLET EVERY DAY (NEED MD APPOINTMENT) 90 tablet 0   ? gabapentin (NEURONTIN) 300 MG capsule 1 cap by mouth in morning, 1 cap by mouth midday and 2 caps by mouth before  bedtime. (Patient taking differently: 300 mg 3 (three) times daily.) 360 capsule 3   ? glucose blood test strip Check sugars twice daily 100 each 11   ? metFORMIN (GLUCOPHAGE) 500 MG tablet Take 500 mg by mouth 2 (two) times daily with a meal.     ? oxyCODONE-acetaminophen (PERCOCET) 10-325 MG tablet Take 1 tablet by mouth every 4 (four) hours as needed for pain. 40 tablet 0   ? ?No current facility-administered medications for this visit.  ? ?Allergies  ?Allergen Reactions  ? Lisinopril-Hydrochlorothiazide Swelling  ?  Angioedema  - Tongue swelling   ? Codeine Itching  ? Oxycodone Itching  ? Simvastatin Other (See Comments)  ?  Muscle pain  ? Tramadol Itching  ?  ?Social History  ? ?Tobacco Use  ? Smoking status: Former  ?  Packs/day: 0.10  ?  Years: 25.00  ?  Pack years: 2.50  ?  Types: Cigarettes  ?  Start date: 05/31/1968  ?  Quit date: 11/30/2020  ?  Years since quitting: 0.4  ? Smokeless tobacco: Never  ? Tobacco comments:  ?  pt states she smokes about 4-5 cigs per day--states she contiues to work on it.  No interest in other help now.  ?Substance Use Topics  ? Alcohol use: Yes  ?  Comment: rare  ?  ?Family History  ?Problem Relation Age of Onset  ? Hypertension Sister   ? Hypothyroidism Sister   ? Cancer Maternal Grandmother   ?     lung  ? Hypertension Son   ? Hypertension Sister   ? Diabetes Brother   ?     lost toe in 2018 with new diagnosis of DM  ? Hypertension Son   ?  ? ?Review of Systems  ?Musculoskeletal:  Positive for arthralgias.  ?All other systems reviewed and are negative. ? ?Objective: ? ?Physical Exam ?Constitutional:   ?   General: She is not in acute distress. ?   Appearance: Normal appearance.  ?HENT:  ?   Head: Normocephalic and atraumatic.  ?Eyes:  ?   Extraocular Movements: Extraocular movements intact.  ?   Pupils: Pupils are equal, round, and reactive to light.  ?Cardiovascular:  ?   Rate and Rhythm: Normal rate and regular rhythm.  ?   Pulses: Normal pulses.  ?   Heart sounds: Normal  heart sounds.  ?Pulmonary:  ?   Effort: Pulmonary effort is normal. No respiratory distress.  ?   Breath sounds: Normal breath sounds. No wheezing.  ?Abdominal:  ?   General: Abdomen is flat. Bowel sounds

## 2021-06-06 NOTE — Patient Instructions (Signed)
2 VISITORS  ARE ALLOWED TO COME WITH YOU AND STAY IN THE WAITING ROOM ONLY DURING PRE OP AND PROCEDURE.  ?  ?**NO VISITORS ARE ALLOWED IN THE SHORT STAY AREA OR RECOVERY ROOM!!** ? ?IF YOU WILL BE ADMITTED INTO THE HOSPITAL YOU ARE ALLOWED ONLY TWO SUPPORT PEOPLE DURING VISITATION HOURS ONLY (7 AM -8PM)   ?The support person(s) must pass our screening, gel in and out, and wear a mask at all times, including in the patient?s room. ?Patients must also wear a mask when staff or their support person are in the room. ?Visitors GUEST BADGE MUST BE WORN VISIBLY  ?One adult visitor may remain with you overnight and MUST be in the room by 8 P.M. ?  ? ? Your procedure is scheduled on: 06/16/21 ? ? Report to [facility] Main Entrance ? ?  Report to short stay at 5:15 AM ? ? Call this number if you have problems the morning of surgery [phone] ? ? Do not eat food :After Midnight. ? ? After Midnight you may have the following liquids until _4:30_____ AM DAY OF SURGERY ? ?Water ?Black Coffee (sugar ok, NO MILK/CREAM OR CREAMERS)  ?Tea (sugar ok, NO MILK/CREAM OR CREAMERS) regular and decaf                             ?Plain Jell-O (NO RED)                                           ?Fruit ices (not with fruit pulp, NO RED)                                     ?Popsicles (NO RED)                                                                  ?Juice: apple, WHITE grape, WHITE cranberry ?Sports drinks like Gatorade (NO RED) ?Clear broth(vegetable,chicken,beef) ? ?               ? ?  ?  ?The day of surgery:  ?Drink ONE (1) G2 at 4:15 AM the morning of surgery. Drink in one sitting. Do not sip.  ?This drink was given to you during your hospital  ?pre-op appointment visit. ?Nothing else to drink after completing the  ? G2.at 4:30 AM ?  ?       If you have questions, please contact your surgeon?s office. ? ? ?  ?  ?Oral Hygiene is also important to reduce your risk of infection.                                    ?Remember -  BRUSH YOUR TEETH THE MORNING OF SURGERY WITH YOUR REGULAR TOOTHPASTE ? ? Do NOT smoke after Midnight ? ? Take these medicines the morning of surgery with A SIP OF WATER: Gabapentin, Carvedilol, Amlodipine, Allopurinol ? ? Before surgery.Stop taking _Plavix__________on __________as instructed  by _____________. ? ?Stop taking _ASA 81 mg___________as directed by your Surgeon/Cardiologist. ? ?Contact your Surgeon/Cardiologist for instructions on Anticoagulant Therapy prior to surgery. ? ? How to Manage Your Diabetes ?Before and After Surgery ? ?Why is it important to control my blood sugar before and after surgery? ?Improving blood sugar levels before and after surgery helps healing and can limit problems. ?A way of improving blood sugar control is eating a healthy diet by: ? Eating less sugar and carbohydrates ? Increasing activity/exercise ? Talking with your doctor about reaching your blood sugar goals ?High blood sugars (greater than 180 mg/dL) can raise your risk of infections and slow your recovery, so you will need to focus on controlling your diabetes during the weeks before surgery. ?Make sure that the doctor who takes care of your diabetes knows about your planned surgery including the date and location. ? ?How do I manage my blood sugar before surgery? ?Check your blood sugar at least 4 times a day, starting 2 days before surgery, to make sure that the level is not too high or low. ?Check your blood sugar the morning of your surgery when you wake up and every 2 hours until you get to the Short Stay unit. ?If your blood sugar is less than 70 mg/dL, you will need to treat for low blood sugar: ?Do not take insulin. ?Treat a low blood sugar (less than 70 mg/dL) with ? cup of clear juice (cranberry or apple), 4 glucose tablets, OR glucose gel. ?Recheck blood sugar in 15 minutes after treatment (to make sure it is greater than 70 mg/dL). If your blood sugar is not greater than 70 mg/dL on recheck, call [phone]  for further instructions. ?Report your blood sugar to the short stay nurse when you get to Short Stay. ? ?If you are admitted to the hospital after surgery: ?Your blood sugar will be checked by the staff and you will probably be given insulin after surgery (instead of oral diabetes medicines) to make sure you have good blood sugar levels. ?The goal for blood sugar control after surgery is 80-180 mg/dL. ? ? ?WHAT DO I DO ABOUT MY DIABETES MEDICATION? ? ?Do not take oral diabetes medicines (pills) the morning of surgery. ?    ? ? ? ?Bring CPAP mask and tubing day of surgery. ?                  ?           You may not have any metal on your body including hair pins, jewelry, and body piercing ? ?           Do not wear make-up, lotions, powders, perfumes/cologne, or deodorant ? ?Do not wear nail polish including gel and S&S, artificial/acrylic nails, or any other type of covering on natural nails including finger and toenails. If you have artificial nails, gel coating, etc. that needs to be removed by a nail salon please have this removed prior to surgery or surgery may need to be canceled/ delayed if the surgeon/ anesthesia feels like they are unable to be safely monitored.  ? ?Do not shave  48 hours prior to surgery.  ?. ? ? Do not bring valuables to the hospital. [facility] IS NOT ?            RESPONSIBLE   FOR VALUABLES. ? ? Contacts, dentures or bridgework may not be worn into surgery. ? ? Bring small overnight bag day of surgery. ? . ? ?  Special Instructions: Bring a copy of your healthcare power of attorney and living will documents  the day of surgery if you haven't scanned them before. ? ?            Please read over the following fact sheets you were given: IF YOU HAVE QUESTIONS ABOUT YOUR PRE-OP INSTRUCTIONS PLEASE CALL [phone] ? ?   [facility] - Preparing for Surgery ?Before surgery, you can play an important role.  Because skin is not sterile, your skin needs to be as free of germs as possible.  You  can reduce the number of germs on your skin by washing with CHG (chlorahexidine gluconate) soap before surgery.  CHG is an antiseptic cleaner which kills germs and bonds with the skin to continue killing germs even after washing. ?Please DO NOT use if you have an allergy to CHG or antibacterial soaps.  If your skin becomes reddened/irritated stop using the CHG and inform your nurse when you arrive at Short Stay. ?Do not shave (including legs and underarms) for at least 48 hours prior to the first CHG shower.  ?Please follow these instructions carefully: ? 1.  Shower with CHG Soap the night before surgery and the  morning of Surgery. ? 2.  If you choose to wash your hair, wash your hair first as usual with your  normal  shampoo. ? 3.  After you shampoo, rinse your hair and body thoroughly to remove the  shampoo.                     ?       4.  Use CHG as you would any other liquid soap.  You can apply chg directly  to the skin and wash  ?                     Gently with a scrungie or clean washcloth. ? 5.  Apply the CHG Soap to your body ONLY FROM THE NECK DOWN.   Do not use on face/ open      ?                     Wound or open sores. Avoid contact with eyes, ears mouth and genitals (private parts).  ?                     Engineering geologist,  Genitals (private parts) with your normal soap. ?            6.  Wash thoroughly, paying special attention to the area where your surgery  will be performed. ? 7.  Thoroughly rinse your body with warm water from the neck down. ? 8.  DO NOT shower/wash with your normal soap after using and rinsing off  the CHG Soap. ?               9.  Pat yourself dry with a clean towel. ?           10.  Wear clean pajamas. ?           11.  Place clean sheets on your bed the night of your first shower and do not  sleep with pets. ?Day of Surgery : ?Do not apply any lotions/deodorants the morning of surgery.  Please wear clean clothes to the hospital/surgery center. ? ?FAILURE TO FOLLOW THESE  INSTRUCTIONS MAY RESULT IN THE CANCELLATION OF YOUR SURGERY ? ? ? ?________________________________________________________________________  ? ?  Incentive Spirometer ? ?An incentive spirometer is a tool that ca

## 2021-06-07 ENCOUNTER — Encounter (HOSPITAL_COMMUNITY)
Admission: RE | Admit: 2021-06-07 | Discharge: 2021-06-07 | Disposition: A | Discharge status: Home / Self Care | Payer: Medicare HMO | Source: Ambulatory Visit | Attending: Orthopedic Surgery | Admitting: Orthopedic Surgery

## 2021-06-07 ENCOUNTER — Encounter (HOSPITAL_COMMUNITY): Payer: Self-pay

## 2021-06-07 DIAGNOSIS — G8929 Other chronic pain: Secondary | ICD-10-CM | POA: Diagnosis not present

## 2021-06-07 DIAGNOSIS — Z01818 Encounter for other preprocedural examination: Secondary | ICD-10-CM

## 2021-06-07 DIAGNOSIS — M1612 Unilateral primary osteoarthritis, left hip: Secondary | ICD-10-CM | POA: Diagnosis not present

## 2021-06-07 DIAGNOSIS — Z87891 Personal history of nicotine dependence: Secondary | ICD-10-CM | POA: Insufficient documentation

## 2021-06-07 DIAGNOSIS — E119 Type 2 diabetes mellitus without complications: Secondary | ICD-10-CM | POA: Insufficient documentation

## 2021-06-07 DIAGNOSIS — I10 Essential (primary) hypertension: Secondary | ICD-10-CM | POA: Diagnosis not present

## 2021-06-07 DIAGNOSIS — I2510 Atherosclerotic heart disease of native coronary artery without angina pectoris: Secondary | ICD-10-CM | POA: Insufficient documentation

## 2021-06-07 DIAGNOSIS — I6522 Occlusion and stenosis of left carotid artery: Secondary | ICD-10-CM | POA: Diagnosis not present

## 2021-06-07 DIAGNOSIS — E785 Hyperlipidemia, unspecified: Secondary | ICD-10-CM | POA: Insufficient documentation

## 2021-06-07 DIAGNOSIS — Z01812 Encounter for preprocedural laboratory examination: Secondary | ICD-10-CM | POA: Diagnosis present

## 2021-06-07 DIAGNOSIS — R7303 Prediabetes: Secondary | ICD-10-CM

## 2021-06-07 LAB — HEMOGLOBIN A1C
Hgb A1c MFr Bld: 7.3 % — ABNORMAL HIGH (ref 4.8–5.6)
Mean Plasma Glucose: 162.81 mg/dL

## 2021-06-07 LAB — CBC WITH DIFFERENTIAL/PLATELET
Abs Immature Granulocytes: 0.05 10*3/uL (ref 0.00–0.07)
Basophils Absolute: 0 10*3/uL (ref 0.0–0.1)
Basophils Relative: 0 %
Eosinophils Absolute: 0.1 10*3/uL (ref 0.0–0.5)
Eosinophils Relative: 1 %
HCT: 32.5 % — ABNORMAL LOW (ref 36.0–46.0)
Hemoglobin: 9.8 g/dL — ABNORMAL LOW (ref 12.0–15.0)
Immature Granulocytes: 1 %
Lymphocytes Relative: 17 %
Lymphs Abs: 1.7 10*3/uL (ref 0.7–4.0)
MCH: 24 pg — ABNORMAL LOW (ref 26.0–34.0)
MCHC: 30.2 g/dL (ref 30.0–36.0)
MCV: 79.5 fL — ABNORMAL LOW (ref 80.0–100.0)
Monocytes Absolute: 0.9 10*3/uL (ref 0.1–1.0)
Monocytes Relative: 9 %
Neutro Abs: 7.2 10*3/uL (ref 1.7–7.7)
Neutrophils Relative %: 72 %
Platelets: 368 10*3/uL (ref 150–400)
RBC: 4.09 MIL/uL (ref 3.87–5.11)
RDW: 17.2 % — ABNORMAL HIGH (ref 11.5–15.5)
WBC: 10 10*3/uL (ref 4.0–10.5)
nRBC: 0 % (ref 0.0–0.2)

## 2021-06-07 LAB — COMPREHENSIVE METABOLIC PANEL
ALT: 9 U/L (ref 0–44)
AST: 13 U/L — ABNORMAL LOW (ref 15–41)
Albumin: 2.9 g/dL — ABNORMAL LOW (ref 3.5–5.0)
Alkaline Phosphatase: 108 U/L (ref 38–126)
Anion gap: 5 (ref 5–15)
BUN: 13 mg/dL (ref 8–23)
CO2: 25 mmol/L (ref 22–32)
Calcium: 8.9 mg/dL (ref 8.9–10.3)
Chloride: 106 mmol/L (ref 98–111)
Creatinine, Ser: 0.68 mg/dL (ref 0.44–1.00)
GFR, Estimated: >60 mL/min (ref >60)
Glucose, Bld: 147 mg/dL — ABNORMAL HIGH (ref 70–99)
Potassium: 4 mmol/L (ref 3.5–5.1)
Sodium: 136 mmol/L (ref 135–145)
Total Bilirubin: 0.3 mg/dL (ref 0.3–1.2)
Total Protein: 8.9 g/dL — ABNORMAL HIGH (ref 6.5–8.1)

## 2021-06-07 LAB — SURGICAL PCR SCREEN
MRSA, PCR: NEGATIVE
Staphylococcus aureus: NEGATIVE

## 2021-06-07 LAB — GLUCOSE, CAPILLARY: Glucose-Capillary: 142 mg/dL — ABNORMAL HIGH (ref 70–99)

## 2021-06-07 NOTE — Progress Notes (Addendum)
COVID Vaccine Completed: yes x4 ?Date COVID Vaccine completed: ?Has received booster: ?COVID vaccine manufacturer: Pfizer     ? ?Date of COVID positive in last 90 days: no ? ?PCP - Dustin Folks, FNP ?Cardiologist - Quay Burow, MD ? ?Cardiac clearance by Christen Bame 05/03/21 Epic ? ?Chest x-ray - n/a ?EKG - 12/20/20 Epic ?Stress Test - 03/28/18 Epic ?ECHO - n/a ?Cardiac Cath - 2013 ?Pacemaker/ICD device last checked: n/a ?Spinal Cord Stimulator:n/a ? ?Bowel Prep - no ? ?Sleep Study - yes, positive ?CPAP - corrective surgery, no machine ? ?Fasting Blood Sugar - 100s ?Checks Blood Sugar 1 time per week ? ?Blood Thinner Instructions: Plavix, hold 6 days, last dose 06/10/21 at 1100 ?Aspirin Instructions: ASA 81, hold 6 days, last dose 06/10/21 1100 ?Last Dose: ? ?Activity level: Can go up a flight of stairs and perform activities of daily living without stopping and without symptoms of chest pain or shortness of breath. Can do one step to get into house ?   ?Anesthesia review:  CAD, HTN, DM2, HLD, Hgb 9.8 ? ?Patient denies shortness of breath, fever, cough and chest pain at PAT appointment ? ? ?Patient verbalized understanding of instructions that were given to them at the PAT appointment. Patient was also instructed that they will need to review over the PAT instructions again at home before surgery.  ?

## 2021-06-07 NOTE — Progress Notes (Signed)
Hgb 9.8, results routed to Dr. Madelon Lips. ?

## 2021-06-07 NOTE — Patient Instructions (Signed)
DUE TO COVID-19 ONLY ONE VISITOR  (aged 69 and older)  IS ALLOWED TO COME WITH YOU AND STAY IN THE WAITING ROOM ONLY DURING PRE OP AND PROCEDURE.   ?**NO VISITORS ARE ALLOWED IN THE SHORT STAY AREA OR RECOVERY ROOM!!** ? ?IF YOU WILL BE ADMITTED INTO THE HOSPITAL YOU ARE ALLOWED ONLY TWO SUPPORT PEOPLE DURING VISITATION HOURS ONLY (7 AM -8PM)   ?The support person(s) must pass our screening, gel in and out, and wear a mask at all times, including in the patient?s room. ?Patients must also wear a mask when staff or their support person are in the room. ?Visitors GUEST BADGE MUST BE WORN VISIBLY  ?One adult visitor may remain with you overnight and MUST be in the room by 8 P.M. ?  ? Your procedure is scheduled on: 06/16/21 ? ? Report to [facility] Main Entrance ? ?  Report to admitting at 5:15 AM ? ? Call this number if you have problems the morning of surgery [phone] ? ? Do not eat food :After Midnight. ? ? After Midnight you may have the following liquids until 4:30 AM DAY OF SURGERY ? ?Water ?Black Coffee (sugar ok, NO MILK/CREAM OR CREAMERS)  ?Tea (sugar ok, NO MILK/CREAM OR CREAMERS) regular and decaf                             ?Plain Jell-O (NO RED)                                           ?Fruit ices (not with fruit pulp, NO RED)                                     ?Popsicles (NO RED)                                                                  ?Juice: apple, WHITE grape, WHITE cranberry ?Sports drinks like Gatorade (NO RED) ?Clear broth(vegetable,chicken,beef) ? ?              ?The day of surgery:  ?Drink ONE (1) Pre-Surgery G2 at 4:30 AM the morning of surgery. Drink in one sitting. Do not sip.  ?This drink was given to you during your hospital  ?pre-op appointment visit. ?Nothing else to drink after completing the  ?Pre-Surgery G2. ?  ?       If you have questions, please contact your surgeon?s office. ? ? ?FOLLOW BOWEL PREP AND ANY ADDITIONAL PRE OP INSTRUCTIONS YOU RECEIVED FROM YOUR  SURGEON'S OFFICE!!! ?  ?  ?Oral Hygiene is also important to reduce your risk of infection.                                    ?Remember - BRUSH YOUR TEETH THE MORNING OF SURGERY WITH YOUR REGULAR TOOTHPASTE ? ? Take these medicines the morning of surgery with A SIP OF WATER: Gabapentin, Carvedilol, Amlodipine, Allopurinol.  ? ?  DO NOT TAKE ANY ORAL DIABETIC MEDICATIONS DAY OF YOUR SURGERY ? ?How to Manage Your Diabetes ?Before and After Surgery ? ?Why is it important to control my blood sugar before and after surgery? ?Improving blood sugar levels before and after surgery helps healing and can limit problems. ?A way of improving blood sugar control is eating a healthy diet by: ? Eating less sugar and carbohydrates ? Increasing activity/exercise ? Talking with your doctor about reaching your blood sugar goals ?High blood sugars (greater than 180 mg/dL) can raise your risk of infections and slow your recovery, so you will need to focus on controlling your diabetes during the weeks before surgery. ?Make sure that the doctor who takes care of your diabetes knows about your planned surgery including the date and location. ? ?How do I manage my blood sugar before surgery? ?Check your blood sugar at least 4 times a day, starting 2 days before surgery, to make sure that the level is not too high or low. ?Check your blood sugar the morning of your surgery when you wake up and every 2 hours until you get to the Short Stay unit. ?If your blood sugar is less than 70 mg/dL, you will need to treat for low blood sugar: ?Do not take insulin. ?Treat a low blood sugar (less than 70 mg/dL) with ? cup of clear juice (cranberry or apple), 4 glucose tablets, OR glucose gel. ?Recheck blood sugar in 15 minutes after treatment (to make sure it is greater than 70 mg/dL). If your blood sugar is not greater than 70 mg/dL on recheck, call [phone] for further instructions. ?Report your blood sugar to the short stay nurse when you get to  Short Stay. ? ?If you are admitted to the hospital after surgery: ?Your blood sugar will be checked by the staff and you will probably be given insulin after surgery (instead of oral diabetes medicines) to make sure you have good blood sugar levels. ?The goal for blood sugar control after surgery is 80-180 mg/dL. ? ? ?WHAT DO I DO ABOUT MY DIABETES MEDICATION? ? ?Do not take oral diabetes medicines (pills) the morning of surgery. ? ?THE DAY BEFORE SURGERY, take Metformin as prescribed.     ? ? ?THE MORNING OF SURGERY, do not take Metformin  ? ?Reviewed and Endorsed by [facility] Patient Education Committee, August 2015  ? ?Bring CPAP mask and tubing day of surgery. ?                  ?           You may not have any metal on your body including hair pins, jewelry, and body piercing ? ?           Do not wear make-up, lotions, powders, perfumes/cologne, or deodorant ? ?Do not wear nail polish including gel and S&S, artificial/acrylic nails, or any other type of covering on natural nails including finger and toenails. If you have artificial nails, gel coating, etc. that needs to be removed by a nail salon please have this removed prior to surgery or surgery may need to be canceled/ delayed if the surgeon/ anesthesia feels like they are unable to be safely monitored.  ? ?Do not shave  48 hours prior to surgery.  ? ? Do not bring valuables to the hospital. [facility] IS NOT ?            RESPONSIBLE   FOR VALUABLES. ? ? Contacts, dentures or bridgework may not  be worn into surgery. ? ? Bring small overnight bag day of surgery. ?  ? Patients discharged on the day of surgery will not be allowed to drive home.  Someone NEEDS to stay with you for the first 24 hours after anesthesia. ? ? Special Instructions: Bring a copy of your healthcare power of attorney and living will documents         the day of surgery if you haven't scanned them before. ? ?            Please read over the following fact sheets you were given: IF YOU  HAVE QUESTIONS ABOUT YOUR PRE-OP INSTRUCTIONS PLEASE CALL [phone]- [place] ? ?   [facility] - Preparing for Surgery ?Before surgery, you can play an important role.  Because skin is not sterile, your skin needs to be as free of germs as possible.  You can reduce the number of germs on your skin by washing with CHG (chlorahexidine gluconate) soap before surgery.  CHG is an antiseptic cleaner which kills germs and bonds with the skin to continue killing germs even after washing. ?Please DO NOT use if you have an allergy to CHG or antibacterial soaps.  If your skin becomes reddened/irritated stop using the CHG and inform your nurse when you arrive at Short Stay. ?Do not shave (including legs and underarms) for at least 48 hours prior to the first CHG shower.  You may shave your face/neck. ? ?Please follow these instructions carefully: ? 1.  Shower with CHG Soap the night before surgery and the  morning of surgery. ? 2.  If you choose to wash your hair, wash your hair first as usual with your normal  shampoo. ? 3.  After you shampoo, rinse your hair and body thoroughly to remove the shampoo.                            ? 4.  Use CHG as you would any other liquid soap.  You can apply chg directly to the skin and wash.  Gently with a scrungie or clean washcloth. ? 5.  Apply the CHG Soap to your body ONLY FROM THE NECK DOWN.   Do   not use on face/ open      ?                     Wound or open sores. Avoid contact with eyes, ears mouth and   genitals (private parts).  ?                     Engineering geologist,  Genitals (private parts) with your normal soap. ?            6.  Wash thoroughly, paying special attention to the area where your    surgery  will be performed. ? 7.  Thoroughly rinse your body with warm water from the neck down. ? 8.  DO NOT shower/wash with your normal soap after using and rinsing off the CHG Soap. ?               9.  Pat yourself dry with a clean towel. ?           10.  Wear clean pajamas. ?            11.  Place clean sheets on your bed the night of your first shower and do not  sleep with pets. ?Day of Surgery : ?Do not apply any lotions/deodorants the morning of surgery.  Please wear clean clothes to

## 2021-06-08 ENCOUNTER — Encounter (HOSPITAL_COMMUNITY)
Admission: RE | Admit: 2021-06-08 | Discharge: 2021-06-08 | Disposition: A | Discharge status: Home / Self Care | Payer: Medicare HMO | Source: Ambulatory Visit | Attending: Anesthesiology | Admitting: Anesthesiology

## 2021-06-08 DIAGNOSIS — R7303 Prediabetes: Secondary | ICD-10-CM

## 2021-06-08 DIAGNOSIS — Z01818 Encounter for other preprocedural examination: Secondary | ICD-10-CM

## 2021-06-09 ENCOUNTER — Encounter (HOSPITAL_COMMUNITY): Payer: Self-pay | Admitting: Emergency Medicine

## 2021-06-09 NOTE — Progress Notes (Signed)
Anesthesia Chart Review: ? ? Case: [id number] Date/Time: 06/16/21 0715  ? Procedure: TOTAL HIP ARTHROPLASTY (Left: Hip)  ? Anesthesia type: Spinal  ? Pre-op diagnosis: OA LEFT HIP  ? Location[facility] RO[facility] 07 / [facility]  ? Surgeons: Earlie Server, MD  ? ?  ? ? ?DISCUSSION: ?Pt is 71 years old with hx smoking, HTN, DM2, HLD, CAD (mild on 2013 cath), carotid artery stenosis (CTO R ICA and moderate disease on left; s/p R CEA 09/27/11), OSA (reportedly "fixed" after T&A), varicose veins (s/p vein stripping 2008) ?  ?Hgb 9.8 at pre-admission testing. Pt's baseline appears to be ~12 prior to February 2022. In March 2022, hgb dropped to 8.1 in the context of a lumbar surgical wound infection after PLIF on 04/25/20. Post-op on 05/27/20 after irrigation and debridement of lumbar wound on 05/25/20, hgb was 6.4 and pt received 2 units PRBCs. Since then, hgb increased to 9.2, 9.6, and then was 9.9 pre-op for PLIF 11/28/20; post-op, hgb dropped to 7.4 and she received 2 units PRBCs; at discharge, hgb was 10.9. Now at pre-admission testing for THA, hgb 9.8.  ? ?I notified Kelly in Dr. Alvester Morin office of pt's ongoing issues with anemia, hgb 9.8. Reviewed hgb with Dr. Tobias Alexander.  ? ?Pt to hold plavix 6 days before surgery ? ? ?VS: BP 132/76   Pulse 93   Temp 36.8 ?C (Oral)   Resp 14   Ht '5\' 3"'  (1.6 m)   Wt 58.5 kg   SpO2 99%   BMI 22.85 kg/m?  ? ?PROVIDERS: ?- PCP is Sonia Side., FNP ?- Cardiologist is Quay Burow, MD. Last office visit 12/20/20. Cleared for surgery at moderate risk by Christen Bame, NP at virtual visit 05/03/21 ?- Vascular surgeon is Servando Snare, MD who is aware of upcoming surgery. Last office visit 04/26/21 ? ? ?LABS: Labs reviewed: Acceptable for surgery. ?- Hgb 9.8 ? ?(all labs ordered are listed, but only abnormal results are displayed) ? ?Labs Reviewed  ?COMPREHENSIVE METABOLIC PANEL - Abnormal; Notable for the following components:  ?    Result Value  ? Glucose, Bld 147 (*)   ? Total Protein 8.9 (*)   ?  Albumin 2.9 (*)   ? AST 13 (*)   ? All other components within normal limits  ?CBC WITH DIFFERENTIAL/PLATELET - Abnormal; Notable for the following components:  ? Hemoglobin 9.8 (*)   ? HCT 32.5 (*)   ? MCV 79.5 (*)   ? MCH 24.0 (*)   ? RDW 17.2 (*)   ? All other components within normal limits  ?HEMOGLOBIN A1C - Abnormal; Notable for the following components:  ? Hgb A1c MFr Bld 7.3 (*)   ? All other components within normal limits  ?GLUCOSE, CAPILLARY - Abnormal; Notable for the following components:  ? Glucose-Capillary 142 (*)   ? All other components within normal limits  ?SURGICAL PCR SCREEN  ?TYPE AND SCREEN  ? ? ? ?EKG 12/20/21: NSR ? ? ?CV: ?Carotid US 12/29/20:  ?- Right Carotid: Evidence consistent with a total occlusion of the right  ?ICA. Non-hemodynamically significant plaque <50% noted in the  ?CCA.  ?- Left Carotid: Velocities in the left ICA are consistent with a 60-79%  ?stenosis. Non-hemodynamically significant plaque <50% noted in the  ?CCA. The ECA appears >50% stenosed.  ?- Vertebrals:  Bilateral vertebral arteries demonstrate antegrade flow.  ?- Subclavians: Normal flow hemodynamics were seen in bilateral subclavian arteries.  ? ?Nuclear stress test 03/28/18:  ?Nuclear stress  EF: 68%. ?The left ventricular ejection fraction is hyperdynamic (>65%). ?There was no ST segment deviation noted during stress. ?The study is normal. ?This is a low risk study. ? ?LHC 01/16/2012 ?LM: Normal in size. 10% distal stenosis ?LAD: Normal in size. 50% discrete ostial stenosis. In the proximal extending into the midsegment, there is a 50% tubular stenosis followed by a short aneurysmal segment. The rest of the vessel has minor irregularities. ?D1: 30% proximal stenosis. ?D2: 30% proximal stenosis. ?D3: Small with minor irregularities. ?LCX: Large in size and dominant. Minor irregularities. 30% proximal stenosis. ?OM1: Small in size with no significant disease. ?OM2: Small in size with no significant disease. ?OM3:  Normal in size with diffuse 30% proximal disease. ?Posterior AV groove: Minor irregularities and gives the PDA and 2 PLA branches. ?RCA: Small and nondominant. Minor irregularities. ?Final Conclusions:  ? 1. No evidence of obstructive coronary artery disease. There is moderate LAD disease which is diffuse as well as mild disease in the left circumflex. ?2. Normal LV systolic function. ?3. Moderate systemic hypertension with mildly elevated LVEDP and mild gradient across the aortic valve. ? ? ?Past Medical History:  ?Diagnosis Date  ? Arthritis   ? Carotid artery narrowing   ? Coronary artery disease   ? Cardiac catheterization November 2013: 50% ostial LAD stenosis 50% mid stenosis. 30% disease in the left circumflex.  ? Diabetes mellitus, type 2 ([place])   ? Hyperlipidemia   ? Hypertension   ? Onychomycosis of toenail 07/31/2016  ? Sleep apnea   ?    Had surgery to correct  ? ? ?Past Surgical History:  ?Procedure Laterality Date  ? ABDOMINAL HYSTERECTOMY    ? APPLICATION OF INTRAOPERATIVE CT SCAN N/A 11/30/2020  ? Procedure: APPLICATION OF INTRAOPERATIVE CT SCAN;  Surgeon: Eustace Moore, MD;  Location: [facility];  Service: Neurosurgery;  Laterality: N/A;  ? BACK SURGERY    ? CARDIAC CATHETERIZATION    ? CATARACT EXTRACTION, BILATERAL Bilateral 2021  ? Dr. Sherral Hammers  ? ENDARTERECTOMY  09/27/2011  ? Procedure: RIGT ENDARTERECTOMY CAROTID;  Surgeon: Mal Misty, MD;  Location: [place];  Service: Vascular;  Laterality: Right;  ? EPIDURAL BLOCK INJECTION  02/2008  ? Drs. Eulis Manly  ? gyn surgery  2004  ? total hysterectomy for mennorhagia,,salpingoophorectomy  ? LAMINECTOMY WITH POSTERIOR LATERAL ARTHRODESIS LEVEL 2 N/A 11/30/2020  ? Procedure: LUMBAR FOUR-FIVE, LUMBAR FIVE-SACRAL ONE POSTERIOR LATERAL FUSION WITH REVISION OF LUMBAR ONE-FIVE HARDWARE AND EXTENSION TO SACRAL ONE AND SACRAL TWO;  Surgeon: Eustace Moore, MD;  Location: [place];  Service: Neurosurgery;  Laterality: N/A;  ? LUMBAR WOUND DEBRIDEMENT N/A  05/25/2020  ? Procedure: LUMBAR WOUND IRRIGATION AND DEBRIDEMENT;  Surgeon: Eustace Moore, MD;  Location: [place];  Service: Neurosurgery;  Laterality: N/A;  ? POSTERIOR LUMBAR FUSION 4 LEVEL N/A 04/25/2020  ? Procedure: POSTERIOR LUMBAR INTERBODY FUSION LUMBAR ONE-TWO, LUMBAR TWO-THREE, LUMBAR THREE-FOUR,LUMBAR FOUR-FIVE.;  Surgeon: Eustace Moore, MD;  Location: [place];  Service: Neurosurgery;  Laterality: N/A;  posterior  ? TONSILLECTOMY    ? TOTAL ABDOMINAL HYSTERECTOMY W/ BILATERAL SALPINGOOPHORECTOMY Bilateral 2000  ? TOTAL HIP ARTHROPLASTY Right 08/22/2018  ? Procedure: TOTAL HIP ARTHROPLASTY;  Surgeon: Earlie Server, MD;  [facility];  Service: Orthopedics;  Laterality: Right;  ? VARICOSE VEIN SURGERY  2008  ? stripping  ? ? ?MEDICATIONS: ? allopurinol (ZYLOPRIM) 300 MG tablet  ? amLODipine (NORVASC) 10 MG tablet  ? aspirin EC 81 MG tablet  ? atorvastatin (LIPITOR)  80 MG tablet  ? blood glucose meter kit and supplies KIT  ? carvedilol (COREG CR) 20 MG 24 hr capsule  ? clopidogrel (PLAVIX) 75 MG tablet  ? ezetimibe (ZETIA) 10 MG tablet  ? gabapentin (NEURONTIN) 600 MG tablet  ? glucose blood test strip  ? metFORMIN (GLUCOPHAGE) 500 MG tablet  ? oxyCODONE-acetaminophen (PERCOCET) 10-325 MG tablet  ? ?No current facility-administered medications for this encounter.  ? ? ?If no changes, I anticipate pt can proceed with surgery as scheduled.  ? ?Willeen Cass, PhD, FNP-BC ?[facility]/Anesthesiology ?Phone: [phone] ?06/09/2021 1:33 PM ? ? ? ? ? ? ?

## 2021-06-09 NOTE — Anesthesia Preprocedure Evaluation (Deleted)
Anesthesia Evaluation  ? ? ?Airway ? ? ? ? ? ? ? Dental ?  ?Pulmonary ?Patient abstained from smoking., former smoker,  ?  ? ? ? ? ? ? ? Cardiovascular ?hypertension,  ? ? ?  ?Neuro/Psych ?  ? GI/Hepatic ?  ?Endo/Other  ?diabetes ? Renal/GU ?  ? ?  ?Musculoskeletal ? ? Abdominal ?  ?Peds ? Hematology ?  ?Anesthesia Other Findings ? ? Reproductive/Obstetrics ? ?  ? ? ? ? ? ? ? ? ? ? ? ? ? ?  ?  ? ? ? ? ? ? ? ? ?Anesthesia Physical ?Anesthesia Plan ? ?ASA:  ? ?Anesthesia Plan:   ? ?Post-op Pain Management:   ? ?Induction:  ? ?PONV Risk Score and Plan:  ? ?Airway Management Planned:  ? ?Additional Equipment:  ? ?Intra-op Plan:  ? ?Post-operative Plan:  ? ?Informed Consent:  ? ?Plan Discussed with:  ? ?Anesthesia Plan Comments: (See APP note by Durel Salts, FNP )  ? ? ? ? ? ? ?Anesthesia Quick Evaluation ? ?

## 2021-06-12 ENCOUNTER — Ambulatory Visit (INDEPENDENT_AMBULATORY_CARE_PROVIDER_SITE_OTHER): Payer: Medicare HMO | Admitting: Podiatrist

## 2021-06-12 ENCOUNTER — Telehealth: Payer: Self-pay

## 2021-06-12 DIAGNOSIS — L600 Ingrowing nail: Secondary | ICD-10-CM

## 2021-06-12 DIAGNOSIS — B351 Tinea unguium: Secondary | ICD-10-CM

## 2021-06-12 DIAGNOSIS — M79675 Pain in left toe(s): Secondary | ICD-10-CM

## 2021-06-12 DIAGNOSIS — L608 Other nail disorders: Secondary | ICD-10-CM

## 2021-06-12 DIAGNOSIS — I739 Peripheral vascular disease, unspecified: Secondary | ICD-10-CM | POA: Diagnosis not present

## 2021-06-12 DIAGNOSIS — M79674 Pain in right toe(s): Secondary | ICD-10-CM

## 2021-06-12 NOTE — Telephone Encounter (Signed)
error 

## 2021-06-12 NOTE — Patient Instructions (Signed)
Good luck and quick healing for your Hip surgery and your bypass procedure!   ?

## 2021-06-12 NOTE — Progress Notes (Signed)
Longitudinal line 1 and 2 left ? ?[place] hip fixed Friday and stent right leg in June ? ?Chief Complaint  ?Patient presents with  ? Nail Problem  ?  Diabetic foot care  ?  ? ?HPI: Patient is 71 y.o. female who presents today for at risk diabetic foot care.  She is having hip surgery and having a stent in her right leg in June. She relates a dark streak in her great toenail of the left foot which is concerning for her.  She is having hip surgery right and a stent in her right leg in June.   ? ?Patient Active Problem List  ? Diagnosis Date Noted  ? Medication monitoring encounter 12/12/2020  ? Nausea 12/12/2020  ? Hammer toes of both feet 11/09/2020  ? Acute maxillary sinusitis 08/08/2020  ? Hyperlipidemia 08/08/2020  ? Pain due to onychomycosis of toenails of both feet 08/08/2020  ? Therapeutic drug monitoring 06/22/2020  ? Wound infection after surgery 05/25/2020  ? Wound drainage 05/24/2020  ? Local infection of the skin and subcutaneous tissue, unspecified 05/24/2020  ? Body mass index (BMI) 27.0-27.9, adult 05/10/2020  ? S/P lumbar fusion 04/25/2020  ? 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 Primary localized osteoarthritis of right hip 08/22/2018  ? Carpal tunnel syndrome, bilateral 08/06/2018  ? Type 2 diabetes mellitus with peripheral neuropathy ([place]) 08/06/2018  ? Degenerative joint disease of right hip 08/06/2018  ? Preoperative clearance 03/26/2018  ? Upper back pain on left side 10/30/2016  ? Encounter for postoperative carotid endarterectomy surveillance 12/07/2015  ? Varicosities of leg 06/03/2015  ? Acute left lumbar radiculopathy 04/15/2014  ? Chronic sciatica of right side 03/17/2013  ? Diabetic peripheral neuropathy ([place]) 03/17/2013  ? Coronary artery disease   ? Carotid artery stenosis s/p CEA 2013 10/16/2011  ? Carotid artery occlusion without infarction, right 09/17/2011  ? Carotid bruit 08/28/2011  ? Gout 04/13/2011  ? DM type 2 with diabetic peripheral neuropathy  ([place]) 04/02/2008  ? Hyperlipidemia associated with type 2 diabetes mellitus ([place]) 04/02/2008  ? Essential hypertension 04/02/2008  ? [place] DISEASE, LUMBOSACRAL SPINE 04/02/2008  ? ? ?Current Outpatient Medications on File Prior to Visit  ?Medication Sig Dispense Refill  ? Methylnaltrexone Bromide (RELISTOR) 150 MG TABS Take by mouth.    ? allopurinol (ZYLOPRIM) 300 MG tablet Take 1 tablet (300 mg total) by mouth daily. 90 tablet 3  ? amLODipine (NORVASC) 10 MG tablet Take 10 mg by mouth daily.    ? aspirin EC 81 MG tablet Take 81 mg by mouth daily.    ? atorvastatin (LIPITOR) 80 MG tablet TAKE 1 TABLET BY MOUTH ONCE DAILY WITH  EVENING  MEAL (Patient taking differently: Take 80 mg by mouth in the morning.) 90 tablet 3  ? blood glucose meter kit and supplies KIT Check sugars twice daily 1 each 0  ? carvedilol (COREG CR) 20 MG 24 hr capsule Take 1 capsule (20 mg total) by mouth daily. 90 capsule 3  ? clopidogrel (PLAVIX) 75 MG tablet Take 75 mg by mouth daily.    ? ezetimibe (ZETIA) 10 MG tablet TAKE 1 TABLET EVERY DAY (NEED MD APPOINTMENT) 90 tablet 0  ? gabapentin (NEURONTIN) 300 MG capsule Take by mouth.    ? gabapentin (NEURONTIN) 600 MG tablet Take 600 mg by mouth 3 (three) times daily.    ? glucose blood test strip Check sugars twice daily 100 each 11  ? metFORMIN (GLUCOPHAGE) 500 MG tablet Take  500 mg by mouth 2 (two) times daily with a meal.    ? oxyCODONE-acetaminophen (PERCOCET) 10-325 MG tablet Take 1 tablet by mouth every 4 (four) hours as needed for pain. 40 tablet 0  ? ?No current facility-administered medications on file prior to visit.  ? ? ?Allergies  ?Allergen Reactions  ? Lisinopril-Hydrochlorothiazide Swelling  ?  Angioedema  - Tongue swelling   ? Codeine Itching  ? Oxycodone Itching  ? Simvastatin Other (See Comments)  ?  Muscle pain  ? Tramadol Itching  ? ? ?Review of Systems ?No fevers, chills, nausea, muscle aches, no difficulty breathing, no calf pain, no chest pain or shortness of  breath. ? ? ?Physical Exam ? ?GENERAL APPEARANCE: Alert, conversant. Appropriately groomed. No acute distress.  ? ?VASCULAR: Pedal pulses palpable 1/4 DP and PT left.  Decreased pulses on the right.    ? ?NEUROLOGIC: sensation is intact to 5.07 monofilament at 2/5 sites bilateral.  Light touch is decreased bilateral, vibratory sensation decreased bilateral ? ?MUSCULOSKELETAL:   No pain, crepitus or limitation noted with foot and ankle range of motion bilateral.  ? ?DERMATOLOGIC: skin is warm, supple, and dry.  Digital nails are thick, discolored, dystrophic, brittle with subungual debris present and clinically mycotic x 10.  Left hallux nail has a longitudinal line on the central aspect of the nail measuring 0.2cm in width.  The left second toenail also has a longitudinal line in the central aspect. No pain with direct pressure.  No discoloration at the base of the nail or the proximal nail fold.   ?  ?Assessment  ? ?  ICD-10-CM   ?1. PAD (peripheral artery disease) (HCC)  I73.9   ?  ?2. Onychocryptosis  L60.0   ?  ? ? ? ?Plan ? ?Debrided the toenails today with a sterile nail nipper and a power burr.  Discussed the longitudinal lines appear to be a nail matrix nevus.  Discussed this is a benign condition.  Discussed we can take a biopsy of the lesion to be sure it is benign, but I would wait until after her hip surgery which is coming up.  She will call if she would like the biopsy after her surgery.  She will call if any questions arise as well.  ? ?

## 2021-06-16 ENCOUNTER — Other Ambulatory Visit: Payer: Self-pay | Admitting: Student

## 2021-06-16 ENCOUNTER — Encounter (HOSPITAL_COMMUNITY): Admission: RE | Payer: Self-pay | Source: Ambulatory Visit

## 2021-06-16 ENCOUNTER — Ambulatory Visit (HOSPITAL_COMMUNITY): Admission: RE | Admit: 2021-06-16 | Payer: Medicare HMO | Source: Ambulatory Visit | Admitting: Orthopedic Surgery

## 2021-06-16 DIAGNOSIS — M5416 Radiculopathy, lumbar region: Secondary | ICD-10-CM

## 2021-06-16 LAB — TYPE AND SCREEN
ABO/RH(D): O POS
Antibody Screen: NEGATIVE

## 2021-06-16 SURGERY — ARTHROPLASTY, HIP, TOTAL,POSTERIOR APPROACH
Anesthesia: Spinal | Site: Hip | Laterality: Left

## 2021-06-18 ENCOUNTER — Encounter: Payer: Self-pay | Admitting: Podiatrist

## 2021-07-18 ENCOUNTER — Ambulatory Visit
Admission: RE | Admit: 2021-07-18 | Discharge: 2021-07-18 | Disposition: A | Discharge status: Home / Self Care | Payer: Medicare HMO | Source: Ambulatory Visit | Attending: Student | Admitting: Student

## 2021-07-18 DIAGNOSIS — M5416 Radiculopathy, lumbar region: Secondary | ICD-10-CM

## 2021-07-25 ENCOUNTER — Other Ambulatory Visit: Payer: Self-pay | Admitting: *Deleted

## 2021-07-25 DIAGNOSIS — I739 Peripheral vascular disease, unspecified: Secondary | ICD-10-CM

## 2021-08-02 ENCOUNTER — Ambulatory Visit (INDEPENDENT_AMBULATORY_CARE_PROVIDER_SITE_OTHER)
Admission: RE | Admit: 2021-08-02 | Discharge: 2021-08-02 | Disposition: A | Discharge status: Home / Self Care | Payer: Medicare HMO | Source: Ambulatory Visit | Attending: Vascular Surgery | Admitting: Vascular Surgery

## 2021-08-02 ENCOUNTER — Encounter: Payer: Self-pay | Admitting: Vascular Surgery

## 2021-08-02 ENCOUNTER — Ambulatory Visit (INDEPENDENT_AMBULATORY_CARE_PROVIDER_SITE_OTHER): Payer: Medicare HMO | Admitting: Vascular Surgery

## 2021-08-02 ENCOUNTER — Ambulatory Visit (HOSPITAL_COMMUNITY)
Admission: RE | Admit: 2021-08-02 | Discharge: 2021-08-02 | Disposition: A | Discharge status: Home / Self Care | Payer: Medicare HMO | Source: Ambulatory Visit | Attending: Vascular Surgery | Admitting: Vascular Surgery

## 2021-08-02 ENCOUNTER — Other Ambulatory Visit: Payer: Self-pay

## 2021-08-02 VITALS — BP 152/86 | HR 66 | Temp 97.9°F | Resp 20 | Ht 63.0 in | Wt 140.0 lb

## 2021-08-02 DIAGNOSIS — I739 Peripheral vascular disease, unspecified: Secondary | ICD-10-CM

## 2021-08-02 NOTE — Progress Notes (Signed)
Patient ID: April Shaffer, female   DOB: [birth year], 71 y.o.   MRN: [id number]  Reason for Consult: Follow-up   Referred by Sonia Side., FNP  Subjective:     HPI:  April Shaffer is a 71 y.o. female history of coronary artery disease with diabetes, hyperlipidemia and hypertension.  She remains on aspirin and Plavix.  She has a history of a right carotid endarterectomy which occluded and a known left carotid bruit and we have followed her carotid ultrasounds last in November of last year.  She was scheduled for left hip replacement but this has not occurred.  She does have right lower extremity pain with cramping when walking in her calf and cramping in her foot at night.  She does not have any tissue loss or ulceration.  No recent stroke, TIA or amaurosis  Past Medical History:  Diagnosis Date   Arthritis    Carotid artery narrowing    Coronary artery disease    Cardiac catheterization November 2013: 50% ostial LAD stenosis 50% mid stenosis. 30% disease in the left circumflex.   Diabetes mellitus, type 2 ([place])    Hyperlipidemia    Hypertension    Onychomycosis of toenail 07/31/2016   Sleep apnea       Had surgery to correct   Family History  Problem Relation Age of Onset   Hypertension Sister    Hypothyroidism Sister    Cancer Maternal Grandmother        lung   Hypertension Son    Hypertension Sister    Diabetes Brother        lost toe in 2018 with new diagnosis of DM   Hypertension Son    Past Surgical History:  Procedure Laterality Date   ABDOMINAL HYSTERECTOMY     APPLICATION OF INTRAOPERATIVE CT SCAN N/A 11/30/2020   Procedure: APPLICATION OF INTRAOPERATIVE CT SCAN;  Surgeon: Eustace Moore, MD;  Location: [facility];  Service: Neurosurgery;  Laterality: N/A;   BACK SURGERY     CARDIAC CATHETERIZATION     CATARACT EXTRACTION, BILATERAL Bilateral 2021   Dr. Sherral Hammers   ENDARTERECTOMY  09/27/2011   Procedure: RIGT ENDARTERECTOMY CAROTID;  Surgeon: Mal Misty,  MD;  Location: [facility];  Service: Vascular;  Laterality: Right;   EPIDURAL BLOCK INJECTION  02/2008   Drs. Eulis Manly   gyn surgery  2004   total hysterectomy for mennorhagia,,salpingoophorectomy   LAMINECTOMY WITH POSTERIOR LATERAL ARTHRODESIS LEVEL 2 N/A 11/30/2020   Procedure: LUMBAR FOUR-FIVE, LUMBAR FIVE-SACRAL ONE POSTERIOR LATERAL FUSION WITH REVISION OF LUMBAR ONE-FIVE HARDWARE AND EXTENSION TO SACRAL ONE AND SACRAL TWO;  Surgeon: Eustace Moore, MD;  Location: [facility];  Service: Neurosurgery;  Laterality: N/A;   LUMBAR WOUND DEBRIDEMENT N/A 05/25/2020   Procedure: LUMBAR WOUND IRRIGATION AND DEBRIDEMENT;  Surgeon: Eustace Moore, MD;  Location: [facility];  Service: Neurosurgery;  Laterality: N/A;   POSTERIOR LUMBAR FUSION 4 LEVEL N/A 04/25/2020   Procedure: POSTERIOR LUMBAR INTERBODY FUSION LUMBAR ONE-TWO, LUMBAR TWO-THREE, LUMBAR THREE-FOUR,LUMBAR FOUR-FIVE.;  Surgeon: Eustace Moore, MD;  Location: [facility];  Service: Neurosurgery;  Laterality: N/A;  posterior   TONSILLECTOMY     TOTAL ABDOMINAL HYSTERECTOMY W/ BILATERAL SALPINGOOPHORECTOMY Bilateral 2000   TOTAL HIP ARTHROPLASTY Right 08/22/2018   Procedure: TOTAL HIP ARTHROPLASTY;  Surgeon: Earlie Server, MD;  Location: [facility];  Service: Orthopedics;  Laterality: Right;   VARICOSE VEIN SURGERY  2008   stripping    Short Social History:  Social History   Tobacco Use   Smoking status: Former    Packs/day: 0.10    Years: 25.00    Pack years: 2.50    Types: Cigarettes    Start date: 05/31/1968    Quit date: 11/30/2020    Years since quitting: 0.6   Smokeless tobacco: Never   Tobacco comments:    pt states she smokes about 4-5 cigs per day--states she contiues to work on it.  No interest in other help now.  Substance Use Topics   Alcohol use: Yes    Comment: rare    Allergies  Allergen Reactions   Lisinopril-Hydrochlorothiazide Swelling    Angioedema  - Tongue swelling    Codeine Itching   Oxycodone Itching    Simvastatin Other (See Comments)    Muscle pain   Tramadol Itching    Current Outpatient Medications  Medication Sig Dispense Refill   allopurinol (ZYLOPRIM) 300 MG tablet Take 1 tablet (300 mg total) by mouth daily. 90 tablet 3   amLODipine (NORVASC) 10 MG tablet Take 10 mg by mouth daily.     aspirin EC 81 MG tablet Take 81 mg by mouth daily.     atorvastatin (LIPITOR) 80 MG tablet TAKE 1 TABLET BY MOUTH ONCE DAILY WITH  EVENING  MEAL (Patient taking differently: Take 80 mg by mouth in the morning.) 90 tablet 3   blood glucose meter kit and supplies KIT Check sugars twice daily 1 each 0   carvedilol (COREG CR) 20 MG 24 hr capsule Take 1 capsule (20 mg total) by mouth daily. 90 capsule 3   clopidogrel (PLAVIX) 75 MG tablet Take 75 mg by mouth daily.     ezetimibe (ZETIA) 10 MG tablet TAKE 1 TABLET EVERY DAY (NEED MD APPOINTMENT) 90 tablet 0   gabapentin (NEURONTIN) 300 MG capsule Take by mouth.     gabapentin (NEURONTIN) 600 MG tablet Take 600 mg by mouth 3 (three) times daily.     glucose blood test strip Check sugars twice daily 100 each 11   metFORMIN (GLUCOPHAGE) 500 MG tablet Take 500 mg by mouth 2 (two) times daily with a meal.     Methylnaltrexone Bromide (RELISTOR) 150 MG TABS Take by mouth.     oxyCODONE-acetaminophen (PERCOCET) 10-325 MG tablet Take 1 tablet by mouth every 4 (four) hours as needed for pain. (Patient not taking: Reported on 08/02/2021) 40 tablet 0   No current facility-administered medications for this visit.    Review of Systems  Constitutional:  Constitutional negative. HENT: HENT negative.  Eyes: Eyes negative.  Respiratory: Respiratory negative.  Cardiovascular: Cardiovascular negative.  GI: Gastrointestinal negative.  Musculoskeletal: Positive for leg pain.  Skin: Skin negative.  Neurological: Neurological negative. Hematologic: Hematologic/lymphatic negative.  Psychiatric: Psychiatric negative.       Objective:  Objective  Vitals:   08/02/21  1049  BP: (!) 152/86  Pulse: 66  Resp: 20  Temp: 97.9 F (36.6 C)  SpO2: 96%     Physical Exam HENT:     Head: Normocephalic.     Mouth/Throat:     Mouth: Mucous membranes are moist.  Eyes:     Pupils: Pupils are equal, round, and reactive to light.  Neck:     Vascular: Carotid bruit present.  Cardiovascular:     Rate and Rhythm: Normal rate.     Pulses:          Femoral pulses are 2+ on the right side and 2+ on the left side.  Popliteal pulses are 0 on the right side.  Pulmonary:     Effort: Pulmonary effort is normal.     Breath sounds: Normal breath sounds.  Abdominal:     General: Abdomen is flat.     Palpations: Abdomen is soft. There is no mass.  Skin:    Capillary Refill: Capillary refill takes less than 2 seconds.  Neurological:     General: No focal deficit present.     Mental Status: She is alert.  Psychiatric:        Mood and Affect: Mood normal.        Behavior: Behavior normal.        Thought Content: Thought content normal.        Judgment: Judgment normal.    Data: ABI Findings:  +---------+------------------+-----+--------+--------+  Right    Rt Pressure (mmHg)IndexWaveformComment   +---------+------------------+-----+--------+--------+  Brachial 171                                      +---------+------------------+-----+--------+--------+  PTA      117               0.68 biphasic          +---------+------------------+-----+--------+--------+  DP       112               0.65 biphasic          +---------+------------------+-----+--------+--------+  Great Toe31                0.18                   +---------+------------------+-----+--------+--------+   +---------+------------------+-----+--------+-------+  Left     Lt Pressure (mmHg)IndexWaveformComment  +---------+------------------+-----+--------+-------+  Brachial 166                                      +---------+------------------+-----+--------+-------+  PTA      165               0.96 biphasic         +---------+------------------+-----+--------+-------+  DP       164               0.96 biphasic         +---------+------------------+-----+--------+-------+  Great Toe92                0.54                  +---------+------------------+-----+--------+-------+   +-------+-----------+-----------+------------+------------+  ABI/TBIToday's ABIToday's TBIPrevious ABIPrevious TBI  +-------+-----------+-----------+------------+------------+  Right  0.68       0.18       0.74        0.47          +-------+-----------+-----------+------------+------------+  Left   0.96       0.54       1.21        0.66          +-------+-----------+-----------+------------+------------+       Right ABIs appear essentially unchanged compared to prior study on  01/06/2022. Left ABIs appear decreased compared to prior study on  01/06/2022.     Summary:  Right: Resting right ankle-brachial index indicates moderate right lower  extremity arterial disease. The right toe-brachial index is abnormal.   Left: Resting left ankle-brachial  index is within normal range. No  evidence of significant left lower extremity arterial disease. The left  toe-brachial index is abnormal.   Study Result  LOWER EXTREMITY ARTERIAL DUPLEX STUDY   Patient Name:  April Shaffer  Date of Exam:   08/02/2021  Medical Rec #: [id number]         Accession #:    [id number]  Date of Birth: 1950-04-25          Patient Gender: F  Patient Age:   33 years  Exam Location:  [facility]  Procedure:      VAS [place] LOWER EXTREMITY ARTERIAL DUPLEX  Referring Phys: Servando Snare    ---------------------------------------------------------------------------  -----     Indications: Peripheral artery disease.   High Risk Factors: Hypertension, hyperlipidemia, Diabetes, past history of                      smoking.   Other Factors: Back surgery x 3, last one October 2022. Patient complains  of                 left toe nail discoloration.   Vascular Interventions: Patient complains of right foot numbness.  Current ABI:            Right: 0.68, Left: 0.96   Performing Technologist: Delorise Shiner RVT      Examination Guidelines: A complete evaluation includes B-mode imaging,  spectral  Doppler, color Doppler, and power Doppler as needed of all accessible  portions  of each vessel. Bilateral testing is considered an integral part of a  complete  examination. Limited examinations for reoccurring indications may be  performed  as noted.        +----------+--------+-----+--------+----------+--------+  RIGHT     PSV cm/sRatioStenosisWaveform  Comments  +----------+--------+-----+--------+----------+--------+  CFA Distal121                  triphasic           +----------+--------+-----+--------+----------+--------+  DFA       74                   biphasic            +----------+--------+-----+--------+----------+--------+  SFA Prox  75                   biphasic            +----------+--------+-----+--------+----------+--------+  SFA Mid   80                   monophasic          +----------+--------+-----+--------+----------+--------+  SFA Distal0            occluded                    +----------+--------+-----+--------+----------+--------+  POP Prox  36                   biphasic            +----------+--------+-----+--------+----------+--------+  POP Distal53                   biphasic            +----------+--------+-----+--------+----------+--------+  ATA Distal29                   monophasic          +----------+--------+-----+--------+----------+--------+  PTA Distal34  monophasic          +----------+--------+-----+--------+----------+--------+   A focal velocity elevation  of 240 cm/s was obtained at proximal to mid SFA  with a VR of 2.7. Findings are characteristic of 50-74% stenosis. Heavily  calcified plaque with large collateral vessel seen at distal SFA. No flow  seen within the distal suggests   occlusion, however may also be acoustic shadowing.         Assessment/Plan:    71 year old female with what sounds like rest pain right lower extremity with occluded SFA and moderately diminished ABIs but severely diminished toe pressure 31 with TBI 0.18.  I discussed with her the multifactorial nature of her pain.  She will continue aspirin, Plavix and statin.  We will set her up for angiogram from a left common femoral approach on a Monday in the near future.  She can continue her antiplatelet agents during the perioperative time course.     Waynetta Sandy MD [facility]

## 2021-08-02 NOTE — H&P (View-Only) (Signed)
 Patient ID: April Shaffer, female   DOB: 04/09/1950, 71 y.o.   MRN: [id number]  Reason for Consult: Follow-up   Referred by Smith, Fred A Jr., FNP  Subjective:     HPI:  April Shaffer is a 71 y.o. female history of coronary artery disease with diabetes, hyperlipidemia and hypertension.  She remains on aspirin and Plavix.  She has a history of a right carotid endarterectomy which occluded and a known left carotid bruit and we have followed her carotid ultrasounds last in November of last year.  She was scheduled for left hip replacement but this has not occurred.  She does have right lower extremity pain with cramping when walking in her calf and cramping in her foot at night.  She does not have any tissue loss or ulceration.  No recent stroke, TIA or amaurosis  Past Medical History:  Diagnosis Date   Arthritis    Carotid artery narrowing    Coronary artery disease    Cardiac catheterization November 2013: 50% ostial LAD stenosis 50% mid stenosis. 30% disease in the left circumflex.   Diabetes mellitus, type 2 (HCC)    Hyperlipidemia    Hypertension    Onychomycosis of toenail 07/31/2016   Sleep apnea       Had surgery to correct   Family History  Problem Relation Age of Onset   Hypertension Sister    Hypothyroidism Sister    Cancer Maternal Grandmother        lung   Hypertension Son    Hypertension Sister    Diabetes Brother        lost toe in 2018 with new diagnosis of DM   Hypertension Son    Past Surgical History:  Procedure Laterality Date   ABDOMINAL HYSTERECTOMY     APPLICATION OF INTRAOPERATIVE CT SCAN N/A 11/30/2020   Procedure: APPLICATION OF INTRAOPERATIVE CT SCAN;  Surgeon: Jones, David S, MD;  Location: [facility];  Service: Neurosurgery;  Laterality: N/A;   BACK SURGERY     CARDIAC CATHETERIZATION     CATARACT EXTRACTION, BILATERAL Bilateral 2021   Dr. Woods   ENDARTERECTOMY  09/27/2011   Procedure: RIGT ENDARTERECTOMY CAROTID;  Surgeon: James D Lawson,  MD;  Location: [facility];  Service: Vascular;  Laterality: Right;   EPIDURAL BLOCK INJECTION  02/2008   Drs. Blackwell, Neudelman   gyn surgery  2004   total hysterectomy for mennorhagia,,salpingoophorectomy   LAMINECTOMY WITH POSTERIOR LATERAL ARTHRODESIS LEVEL 2 N/A 11/30/2020   Procedure: LUMBAR FOUR-FIVE, LUMBAR FIVE-SACRAL ONE POSTERIOR LATERAL FUSION WITH REVISION OF LUMBAR ONE-FIVE HARDWARE AND EXTENSION TO SACRAL ONE AND SACRAL TWO;  Surgeon: Jones, David S, MD;  Location: [facility];  Service: Neurosurgery;  Laterality: N/A;   LUMBAR WOUND DEBRIDEMENT N/A 05/25/2020   Procedure: LUMBAR WOUND IRRIGATION AND DEBRIDEMENT;  Surgeon: Jones, David S, MD;  Location: [facility];  Service: Neurosurgery;  Laterality: N/A;   POSTERIOR LUMBAR FUSION 4 LEVEL N/A 04/25/2020   Procedure: POSTERIOR LUMBAR INTERBODY FUSION LUMBAR ONE-TWO, LUMBAR TWO-THREE, LUMBAR THREE-FOUR,LUMBAR FOUR-FIVE.;  Surgeon: Jones, David S, MD;  Location: [facility];  Service: Neurosurgery;  Laterality: N/A;  posterior   TONSILLECTOMY     TOTAL ABDOMINAL HYSTERECTOMY W/ BILATERAL SALPINGOOPHORECTOMY Bilateral 2000   TOTAL HIP ARTHROPLASTY Right 08/22/2018   Procedure: TOTAL HIP ARTHROPLASTY;  Surgeon: Caffrey, Daniel, MD;  Location: [facility];  Service: Orthopedics;  Laterality: Right;   VARICOSE VEIN SURGERY  2008   stripping    Short Social History:    Social History   Tobacco Use   Smoking status: Former    Packs/day: 0.10    Years: 25.00    Pack years: 2.50    Types: Cigarettes    Start date: 05/31/1968    Quit date: 11/30/2020    Years since quitting: 0.6   Smokeless tobacco: Never   Tobacco comments:    pt states she smokes about 4-5 cigs per day--states she contiues to work on it.  No interest in other help now.  Substance Use Topics   Alcohol use: Yes    Comment: rare    Allergies  Allergen Reactions   Lisinopril-Hydrochlorothiazide Swelling    Angioedema  - Tongue swelling    Codeine Itching   Oxycodone Itching    Simvastatin Other (See Comments)    Muscle pain   Tramadol Itching    Current Outpatient Medications  Medication Sig Dispense Refill   allopurinol (ZYLOPRIM) 300 MG tablet Take 1 tablet (300 mg total) by mouth daily. 90 tablet 3   amLODipine (NORVASC) 10 MG tablet Take 10 mg by mouth daily.     aspirin EC 81 MG tablet Take 81 mg by mouth daily.     atorvastatin (LIPITOR) 80 MG tablet TAKE 1 TABLET BY MOUTH ONCE DAILY WITH  EVENING  MEAL (Patient taking differently: Take 80 mg by mouth in the morning.) 90 tablet 3   blood glucose meter kit and supplies KIT Check sugars twice daily 1 each 0   carvedilol (COREG CR) 20 MG 24 hr capsule Take 1 capsule (20 mg total) by mouth daily. 90 capsule 3   clopidogrel (PLAVIX) 75 MG tablet Take 75 mg by mouth daily.     ezetimibe (ZETIA) 10 MG tablet TAKE 1 TABLET EVERY DAY (NEED MD APPOINTMENT) 90 tablet 0   gabapentin (NEURONTIN) 300 MG capsule Take by mouth.     gabapentin (NEURONTIN) 600 MG tablet Take 600 mg by mouth 3 (three) times daily.     glucose blood test strip Check sugars twice daily 100 each 11   metFORMIN (GLUCOPHAGE) 500 MG tablet Take 500 mg by mouth 2 (two) times daily with a meal.     Methylnaltrexone Bromide (RELISTOR) 150 MG TABS Take by mouth.     oxyCODONE-acetaminophen (PERCOCET) 10-325 MG tablet Take 1 tablet by mouth every 4 (four) hours as needed for pain. (Patient not taking: Reported on 08/02/2021) 40 tablet 0   No current facility-administered medications for this visit.    Review of Systems  Constitutional:  Constitutional negative. HENT: HENT negative.  Eyes: Eyes negative.  Respiratory: Respiratory negative.  Cardiovascular: Cardiovascular negative.  GI: Gastrointestinal negative.  Musculoskeletal: Positive for leg pain.  Skin: Skin negative.  Neurological: Neurological negative. Hematologic: Hematologic/lymphatic negative.  Psychiatric: Psychiatric negative.       Objective:  Objective  Vitals:   08/02/21  1049  BP: (!) 152/86  Pulse: 66  Resp: 20  Temp: 97.9 F (36.6 C)  SpO2: 96%     Physical Exam HENT:     Head: Normocephalic.     Mouth/Throat:     Mouth: Mucous membranes are moist.  Eyes:     Pupils: Pupils are equal, round, and reactive to light.  Neck:     Vascular: Carotid bruit present.  Cardiovascular:     Rate and Rhythm: Normal rate.     Pulses:          Femoral pulses are 2+ on the right side and 2+ on the left side.        Popliteal pulses are 0 on the right side.  Pulmonary:     Effort: Pulmonary effort is normal.     Breath sounds: Normal breath sounds.  Abdominal:     General: Abdomen is flat.     Palpations: Abdomen is soft. There is no mass.  Skin:    Capillary Refill: Capillary refill takes less than 2 seconds.  Neurological:     General: No focal deficit present.     Mental Status: She is alert.  Psychiatric:        Mood and Affect: Mood normal.        Behavior: Behavior normal.        Thought Content: Thought content normal.        Judgment: Judgment normal.    Data: ABI Findings:  +---------+------------------+-----+--------+--------+  Right    Rt Pressure (mmHg)IndexWaveformComment   +---------+------------------+-----+--------+--------+  Brachial 171                                      +---------+------------------+-----+--------+--------+  PTA      117               0.68 biphasic          +---------+------------------+-----+--------+--------+  DP       112               0.65 biphasic          +---------+------------------+-----+--------+--------+  Great Toe31                0.18                   +---------+------------------+-----+--------+--------+   +---------+------------------+-----+--------+-------+  Left     Lt Pressure (mmHg)IndexWaveformComment  +---------+------------------+-----+--------+-------+  Brachial 166                                      +---------+------------------+-----+--------+-------+  PTA      165               0.96 biphasic         +---------+------------------+-----+--------+-------+  DP       164               0.96 biphasic         +---------+------------------+-----+--------+-------+  Great Toe92                0.54                  +---------+------------------+-----+--------+-------+   +-------+-----------+-----------+------------+------------+  ABI/TBIToday's ABIToday's TBIPrevious ABIPrevious TBI  +-------+-----------+-----------+------------+------------+  Right  0.68       0.18       0.74        0.47          +-------+-----------+-----------+------------+------------+  Left   0.96       0.54       1.21        0.66          +-------+-----------+-----------+------------+------------+       Right ABIs appear essentially unchanged compared to prior study on  01/06/2022. Left ABIs appear decreased compared to prior study on  01/06/2022.     Summary:  Right: Resting right ankle-brachial index indicates moderate right lower  extremity arterial disease. The right toe-brachial index is abnormal.   Left: Resting left ankle-brachial   index is within normal range. No  evidence of significant left lower extremity arterial disease. The left  toe-brachial index is abnormal.   Study Result  LOWER EXTREMITY ARTERIAL DUPLEX STUDY   Patient Name:  April Shaffer  Date of Exam:   08/02/2021  Medical Rec #: [id number]         Accession #:    [id number]  Date of Birth: 09/21/1950          Patient Gender: F  Patient Age:   71 years  Exam Location:  [facility]  Procedure:      VAS US LOWER EXTREMITY ARTERIAL DUPLEX  Referring Phys: Eric Morganti    ---------------------------------------------------------------------------  -----     Indications: Peripheral artery disease.   High Risk Factors: Hypertension, hyperlipidemia, Diabetes, past history of                      smoking.   Other Factors: Back surgery x 3, last one October 2022. Patient complains  of                 left toe nail discoloration.   Vascular Interventions: Patient complains of right foot numbness.  Current ABI:            Right: 0.68, Left: 0.96   Performing Technologist: Matthew Cravey RVT      Examination Guidelines: A complete evaluation includes B-mode imaging,  spectral  Doppler, color Doppler, and power Doppler as needed of all accessible  portions  of each vessel. Bilateral testing is considered an integral part of a  complete  examination. Limited examinations for reoccurring indications may be  performed  as noted.        +----------+--------+-----+--------+----------+--------+  RIGHT     PSV cm/sRatioStenosisWaveform  Comments  +----------+--------+-----+--------+----------+--------+  CFA Distal121                  triphasic           +----------+--------+-----+--------+----------+--------+  DFA       74                   biphasic            +----------+--------+-----+--------+----------+--------+  SFA Prox  75                   biphasic            +----------+--------+-----+--------+----------+--------+  SFA Mid   80                   monophasic          +----------+--------+-----+--------+----------+--------+  SFA Distal0            occluded                    +----------+--------+-----+--------+----------+--------+  POP Prox  36                   biphasic            +----------+--------+-----+--------+----------+--------+  POP Distal53                   biphasic            +----------+--------+-----+--------+----------+--------+  ATA Distal29                   monophasic          +----------+--------+-----+--------+----------+--------+  PTA Distal34                     monophasic          +----------+--------+-----+--------+----------+--------+   A focal velocity elevation  of 240 cm/s was obtained at proximal to mid SFA  with a VR of 2.7. Findings are characteristic of 50-74% stenosis. Heavily  calcified plaque with large collateral vessel seen at distal SFA. No flow  seen within the distal suggests   occlusion, however may also be acoustic shadowing.         Assessment/Plan:    71-year-old female with what sounds like rest pain right lower extremity with occluded SFA and moderately diminished ABIs but severely diminished toe pressure 31 with TBI 0.18.  I discussed with her the multifactorial nature of her pain.  She will continue aspirin, Plavix and statin.  We will set her up for angiogram from a left common femoral approach on a Monday in the near future.  She can continue her antiplatelet agents during the perioperative time course.     Dashel Goines Christopher Basha Krygier MD [facility]   

## 2021-08-04 ENCOUNTER — Other Ambulatory Visit: Payer: Self-pay | Admitting: Family

## 2021-08-04 DIAGNOSIS — Z1231 Encounter for screening mammogram for malignant neoplasm of breast: Secondary | ICD-10-CM

## 2021-08-07 ENCOUNTER — Encounter (HOSPITAL_COMMUNITY)
Admission: RE | Disposition: A | Discharge status: Home / Self Care | Payer: Self-pay | Source: Home / Self Care | Attending: Vascular Surgery

## 2021-08-07 ENCOUNTER — Other Ambulatory Visit: Payer: Self-pay

## 2021-08-07 ENCOUNTER — Ambulatory Visit (HOSPITAL_COMMUNITY)
Admission: RE | Admit: 2021-08-07 | Discharge: 2021-08-07 | Disposition: A | Discharge status: Home / Self Care | Payer: Medicare HMO | Attending: Vascular Surgery | Admitting: Vascular Surgery

## 2021-08-07 DIAGNOSIS — I2510 Atherosclerotic heart disease of native coronary artery without angina pectoris: Secondary | ICD-10-CM | POA: Insufficient documentation

## 2021-08-07 DIAGNOSIS — Z7902 Long term (current) use of antithrombotics/antiplatelets: Secondary | ICD-10-CM | POA: Diagnosis not present

## 2021-08-07 DIAGNOSIS — Z7982 Long term (current) use of aspirin: Secondary | ICD-10-CM | POA: Diagnosis not present

## 2021-08-07 DIAGNOSIS — I70221 Atherosclerosis of native arteries of extremities with rest pain, right leg: Secondary | ICD-10-CM | POA: Diagnosis not present

## 2021-08-07 DIAGNOSIS — E1151 Type 2 diabetes mellitus with diabetic peripheral angiopathy without gangrene: Secondary | ICD-10-CM | POA: Insufficient documentation

## 2021-08-07 DIAGNOSIS — I10 Essential (primary) hypertension: Secondary | ICD-10-CM | POA: Diagnosis not present

## 2021-08-07 DIAGNOSIS — E785 Hyperlipidemia, unspecified: Secondary | ICD-10-CM | POA: Diagnosis not present

## 2021-08-07 DIAGNOSIS — Z87891 Personal history of nicotine dependence: Secondary | ICD-10-CM | POA: Insufficient documentation

## 2021-08-07 DIAGNOSIS — Z7984 Long term (current) use of oral hypoglycemic drugs: Secondary | ICD-10-CM | POA: Diagnosis not present

## 2021-08-07 DIAGNOSIS — I739 Peripheral vascular disease, unspecified: Secondary | ICD-10-CM

## 2021-08-07 HISTORY — PX: PERIPHERAL VASCULAR INTERVENTION: CATH118257

## 2021-08-07 HISTORY — PX: ABDOMINAL AORTOGRAM W/LOWER EXTREMITY: CATH118223

## 2021-08-07 LAB — POCT I-STAT, CHEM 8
BUN: 10 mg/dL (ref 8–23)
Calcium, Ion: 1.25 mmol/L (ref 1.15–1.40)
Chloride: 103 mmol/L (ref 98–111)
Creatinine, Ser: 0.4 mg/dL — ABNORMAL LOW (ref 0.44–1.00)
Glucose, Bld: 102 mg/dL — ABNORMAL HIGH (ref 70–99)
HCT: 31 % — ABNORMAL LOW (ref 36.0–46.0)
Hemoglobin: 10.5 g/dL — ABNORMAL LOW (ref 12.0–15.0)
Potassium: 4.6 mmol/L (ref 3.5–5.1)
Sodium: 140 mmol/L (ref 135–145)
TCO2: 27 mmol/L (ref 22–32)

## 2021-08-07 LAB — GLUCOSE, CAPILLARY: Glucose-Capillary: 106 mg/dL — ABNORMAL HIGH (ref 70–99)

## 2021-08-07 SURGERY — ABDOMINAL AORTOGRAM W/LOWER EXTREMITY
Anesthesia: LOCAL

## 2021-08-07 MED ORDER — LIDOCAINE HCL (PF) 1 % IJ SOLN
INTRAMUSCULAR | Status: AC
  Filled 2021-08-07: qty 30

## 2021-08-07 MED ORDER — HEPARIN SODIUM (PORCINE) 1000 UNIT/ML IJ SOLN
INTRAMUSCULAR | Status: AC
Start: 2021-08-07 — End: ?
  Filled 2021-08-07: qty 10

## 2021-08-07 MED ORDER — MIDAZOLAM HCL 2 MG/2ML IJ SOLN
INTRAMUSCULAR | Status: DC | PRN
  Administered 2021-08-07: 1 mg via INTRAVENOUS

## 2021-08-07 MED ORDER — MORPHINE SULFATE (PF) 2 MG/ML IV SOLN: 2.0000 mg | INTRAVENOUS | Status: DC | PRN

## 2021-08-07 MED ORDER — SODIUM CHLORIDE 0.9% FLUSH: 3.0000 mL | INTRAVENOUS | Status: DC | PRN

## 2021-08-07 MED ORDER — HEPARIN (PORCINE) IN NACL 1000-0.9 UT/500ML-% IV SOLN
INTRAVENOUS | Status: AC
  Filled 2021-08-07: qty 1000

## 2021-08-07 MED ORDER — LABETALOL HCL 5 MG/ML IV SOLN: 10.0000 mg | INTRAVENOUS | Status: DC | PRN

## 2021-08-07 MED ORDER — ACETAMINOPHEN 325 MG PO TABS: 650.0000 mg | ORAL_TABLET | ORAL | Status: DC | PRN

## 2021-08-07 MED ORDER — HYDRALAZINE HCL 20 MG/ML IJ SOLN: 5.0000 mg | INTRAMUSCULAR | Status: DC | PRN

## 2021-08-07 MED ORDER — FENTANYL CITRATE (PF) 100 MCG/2ML IJ SOLN
INTRAMUSCULAR | Status: AC
  Filled 2021-08-07: qty 2

## 2021-08-07 MED ORDER — SODIUM CHLORIDE 0.9 % IV SOLN: INTRAVENOUS | Status: DC

## 2021-08-07 MED ORDER — HEPARIN (PORCINE) IN NACL 1000-0.9 UT/500ML-% IV SOLN
INTRAVENOUS | Status: DC | PRN
  Administered 2021-08-07 (×2): 500 mL

## 2021-08-07 MED ORDER — SODIUM CHLORIDE 0.9% FLUSH: 3.0000 mL | Freq: Two times a day (BID) | INTRAVENOUS | Status: DC

## 2021-08-07 MED ORDER — LIDOCAINE HCL (PF) 1 % IJ SOLN
INTRAMUSCULAR | Status: DC | PRN
  Administered 2021-08-07: 15 mL

## 2021-08-07 MED ORDER — FENTANYL CITRATE (PF) 100 MCG/2ML IJ SOLN
INTRAMUSCULAR | Status: DC | PRN
  Administered 2021-08-07 (×2): 25 ug via INTRAVENOUS

## 2021-08-07 MED ORDER — SODIUM CHLORIDE 0.9 % IV SOLN
250.0000 mL | INTRAVENOUS | Status: DC | PRN
Start: 2021-08-07 — End: 2021-08-07

## 2021-08-07 MED ORDER — HEPARIN SODIUM (PORCINE) 1000 UNIT/ML IJ SOLN
INTRAMUSCULAR | Status: DC | PRN
  Administered 2021-08-07: 3000 [IU] via INTRAVENOUS
  Administered 2021-08-07: 7000 [IU] via INTRAVENOUS

## 2021-08-07 MED ORDER — ONDANSETRON HCL 4 MG/2ML IJ SOLN: 4.0000 mg | Freq: Four times a day (QID) | INTRAMUSCULAR | Status: DC | PRN

## 2021-08-07 MED ORDER — IODIXANOL 320 MG/ML IV SOLN
INTRAVENOUS | Status: DC | PRN
  Administered 2021-08-07: 130 mL

## 2021-08-07 MED ORDER — MIDAZOLAM HCL 2 MG/2ML IJ SOLN
INTRAMUSCULAR | Status: AC
  Filled 2021-08-07: qty 2

## 2021-08-07 SURGICAL SUPPLY — 29 items
BAG SNAP BAND KOVER 36X36 (MISCELLANEOUS) ×1 IMPLANT
BALLN MUSTANG 4X150X135 (BALLOONS) ×3
BALLN STERLING OTW 5X60X135 (BALLOONS) ×3
BALLOON MUSTANG 4X150X135 (BALLOONS) IMPLANT
BALLOON STERLING OTW 5X60X135 (BALLOONS) IMPLANT
CATH CROSS OVER TEMPO 5F (CATHETERS) ×1 IMPLANT
CATH CXI 2.6F 65 ST (CATHETERS) ×3
CATH CXI SUPP ST 4FR 90CM (CATHETERS) ×1 IMPLANT
CATH OMNI FLUSH 5F 65CM (CATHETERS) ×1 IMPLANT
CATH SPRT STRG 65X2.6FR ACPT (CATHETERS) IMPLANT
DEVICE ONE SNARE 10MM (MISCELLANEOUS) ×1 IMPLANT
DEVICE TORQUE .025-.038 (MISCELLANEOUS) ×1 IMPLANT
GLIDEWIRE ADV .035X260CM (WIRE) ×1 IMPLANT
GUIDEWIRE ANGLED .035X150CM (WIRE) ×1 IMPLANT
KIT ENCORE 26 ADVANTAGE (KITS) ×1 IMPLANT
KIT MICROPUNCTURE NIT STIFF (SHEATH) ×1 IMPLANT
KIT PV (KITS) ×4 IMPLANT
SHEATH MICROPUNCTURE PEDAL 4FR (SHEATH) ×1 IMPLANT
SHEATH PINNACLE 5F 10CM (SHEATH) ×1 IMPLANT
SHEATH PINNACLE 6F 10CM (SHEATH) ×1 IMPLANT
SHEATH PINNACLE ST 6F 45CM (SHEATH) ×1 IMPLANT
SHEATH PROBE COVER 6X72 (BAG) ×2 IMPLANT
STENT ELUVIA 6X150X130 (Permanent Stent) ×1 IMPLANT
SYR MEDRAD MARK V 150ML (SYRINGE) ×1 IMPLANT
TRANSDUCER W/STOPCOCK (MISCELLANEOUS) ×4 IMPLANT
TRAY PV CATH (CUSTOM PROCEDURE TRAY) ×4 IMPLANT
WIRE BENTSON .035X145CM (WIRE) ×1 IMPLANT
WIRE G V18X300CM (WIRE) ×3 IMPLANT
WIRE SHEPHERD 12G .014 (WIRE) ×1 IMPLANT

## 2021-08-07 NOTE — Interval H&P Note (Signed)
History and Physical Interval Note:  08/07/2021 12:23 PM  April Shaffer  has presented today for surgery, with the diagnosis of PAD with rest pain.  The various methods of treatment have been discussed with the patient and family. After consideration of risks, benefits and other options for treatment, the patient has consented to  Procedure(s): ABDOMINAL AORTOGRAM W/LOWER EXTREMITY (N/A) as a surgical intervention.  The patient's history has been reviewed, patient examined, no change in status, stable for surgery.  I have reviewed the patient's chart and labs.  Questions were answered to the patient's satisfaction.     Lemar Livings

## 2021-08-07 NOTE — Progress Notes (Signed)
Purewick placed-education given 

## 2021-08-07 NOTE — Op Note (Signed)
Patient name: April Shaffer MRN: [id number] DOB: 1950-12-13 Sex: female  08/07/2021 Pre-operative Diagnosis: Right lower extremity rest pain Post-operative diagnosis:  Same Surgeon:  [name] C. Randie Heinz, MD Procedure Performed: 1.  Ultrasound-guided cannulation left common femoral artery 2.  Aortogram with bilateral lower extremity runoff 3.  Ultrasound-guided cannulation right posterior tibial artery 4.  Stent of right SFA with a 6 x 150 mm Elluvia 5.  Mynx device closure left common femoral artery 6.  Moderate sedation with fentanyl Versed 470 minutes  Indications: 71 year old female with history of right lower extremity pain.  She has a severely depressed toe pressure 31 with biphasic waveforms and duplex evidence of SFA occlusion.  She is indicated for angiography with possible invention.  Findings: Aorta and iliac segments do not appear to have any flow-limiting stenosis however her significant previous spinal fusions do limit the ability to fully evaluate.  Bilateral common femoral arteries are patent.  On the left side the SFA is patent as is the popliteal although diseased there appears to be two-vessel runoff via the anterior tibial and posterior tibial.  On the right side the SFA has approximately 50% nonflow limiting stenosis proximally and then distally occludes for a segment of at least 10 cm.  The runoff is via the anterior tibial and posterior tibial.  After posterior tibial cannulation we were able to snare wire through and through primarily stented the SFA occlusion and at completion there was an ankle mean arterial pressure through the sheath at the ankle of 90 mmHg.  Mynx device closure was placed in the left common femoral artery.  If this stent fails patient would need right common femoral to below-knee popliteal artery bypass   Procedure:  The patient was identified in the holding area and taken to room 8.  The patient was then placed supine on the table and prepped and  draped in the usual sterile fashion.  A time out was called.  Ultrasound was used to evaluate the left common femoral artery.  The artery was patent.  The area was anesthetized 1% lidocaine cannulated micropuncture needle followed by wire and sheath.  Images were saved department record.  Bentson wires placed followed by 5 French sheath and Omni catheter placed with a 1 and aortogram performed followed by bilateral lower extremity runoff.  With the above findings we crossed the bifurcation with Glidewire advantage and crossover catheter placed a long 6 French sheath patient was fully heparinized.  I attempted to cross using Glidewire and Glidewire advantage and quick cross catheter from above but I could not cross in ultimately entered a dissection plane.  With this the right foot was prepped.  The right posterior tibial artery was identified and cannulated with micropuncture needle followed by wire and a 4 French micro sheath.  I used combination of V18 and 014 wires from below to attempt to cross and ultimately I was able to cross into the true lumen from below with a V18 wire and this was snared and pulled through and through.  I then primarily dilated the occluded segment of the right SFA with a 4 mm balloon and primarily stented with a 6 x 150 mm drug-eluting stent.  This was postdilated first with 4 mm balloon and then with 5 mm balloon.  Completion demonstrated no residual stenosis where previously was occluded.  There was a mean arterial ankle pressure of 90 mmHg.  Satisfied with this we exchanged the wire to a Bentson wire and exchanged for short  6 French sheath deployed a minx device.  The ankle sheath was removed and pressure held.  There were very strong posterior tibial and dorsalis pedis signals at the ankle.  She tolerated procedure without any complication  Contrast: 130cc   Amirrah Quigley C. Randie Heinz, MD [facility] Office: [phone] Pager: [phone]

## 2021-08-08 ENCOUNTER — Encounter (HOSPITAL_COMMUNITY): Payer: Self-pay | Admitting: Vascular Surgery

## 2021-08-10 ENCOUNTER — Telehealth: Payer: Self-pay

## 2021-08-11 ENCOUNTER — Telehealth: Payer: Self-pay

## 2021-08-11 NOTE — Telephone Encounter (Signed)
Pt called stating that she began to have swelling in her feet yesterday, 6/15. She had a procedure with Dr Randie Heinz on 6/12.  Returned pt's call, two identifiers used. Reviewed pt's chart. Pt stated that she'd only elevated them last HS in bed and they were improved this morning. Informed pt that swelling is common and could last from a few days to a couple of weeks. Instructed pt to elevate her feet above her heart, as much as possible, during the day and night. Pt confirmed understanding. Will call back on Monday if no improvement.

## 2021-08-14 ENCOUNTER — Telehealth: Payer: Self-pay | Admitting: Nurse Practitioner

## 2021-08-14 NOTE — Telephone Encounter (Signed)
Scheduled appt per 6/19 referral. Pt is aware of appt date and time. Pt is aware to arrive 15 mins prior to appt time and to bring and updated insurance card. Pt is aware of appt location.   

## 2021-08-18 ENCOUNTER — Ambulatory Visit
Admission: RE | Admit: 2021-08-18 | Discharge: 2021-08-18 | Disposition: A | Discharge status: Home / Self Care | Payer: Medicare HMO | Source: Ambulatory Visit | Attending: Family | Admitting: Family

## 2021-08-18 DIAGNOSIS — Z1231 Encounter for screening mammogram for malignant neoplasm of breast: Secondary | ICD-10-CM

## 2021-08-20 ENCOUNTER — Other Ambulatory Visit: Payer: Self-pay

## 2021-08-20 DIAGNOSIS — I739 Peripheral vascular disease, unspecified: Secondary | ICD-10-CM

## 2021-08-21 ENCOUNTER — Inpatient Hospital Stay: Payer: Medicare HMO

## 2021-08-21 ENCOUNTER — Encounter: Payer: Self-pay | Admitting: Nurse Practitioner

## 2021-08-21 ENCOUNTER — Telehealth: Payer: Self-pay

## 2021-08-21 ENCOUNTER — Inpatient Hospital Stay: Payer: Medicare HMO | Attending: Nurse Practitioner | Admitting: Nurse Practitioner

## 2021-08-21 ENCOUNTER — Other Ambulatory Visit: Payer: Self-pay

## 2021-08-21 VITALS — BP 171/69 | HR 88 | Temp 97.7°F | Resp 16 | Ht 63.0 in | Wt 141.8 lb

## 2021-08-21 DIAGNOSIS — I10 Essential (primary) hypertension: Secondary | ICD-10-CM | POA: Diagnosis not present

## 2021-08-21 DIAGNOSIS — Z7982 Long term (current) use of aspirin: Secondary | ICD-10-CM | POA: Diagnosis not present

## 2021-08-21 DIAGNOSIS — I2510 Atherosclerotic heart disease of native coronary artery without angina pectoris: Secondary | ICD-10-CM | POA: Diagnosis not present

## 2021-08-21 DIAGNOSIS — Z7984 Long term (current) use of oral hypoglycemic drugs: Secondary | ICD-10-CM | POA: Insufficient documentation

## 2021-08-21 DIAGNOSIS — Z7902 Long term (current) use of antithrombotics/antiplatelets: Secondary | ICD-10-CM | POA: Insufficient documentation

## 2021-08-21 DIAGNOSIS — Z79899 Other long term (current) drug therapy: Secondary | ICD-10-CM | POA: Insufficient documentation

## 2021-08-21 DIAGNOSIS — F17210 Nicotine dependence, cigarettes, uncomplicated: Secondary | ICD-10-CM | POA: Insufficient documentation

## 2021-08-21 DIAGNOSIS — E119 Type 2 diabetes mellitus without complications: Secondary | ICD-10-CM | POA: Insufficient documentation

## 2021-08-21 DIAGNOSIS — D649 Anemia, unspecified: Secondary | ICD-10-CM | POA: Insufficient documentation

## 2021-08-21 LAB — CBC WITH DIFFERENTIAL (CANCER CENTER ONLY)
Abs Immature Granulocytes: 0.05 10*3/uL (ref 0.00–0.07)
Basophils Absolute: 0.1 10*3/uL (ref 0.0–0.1)
Basophils Relative: 1 %
Eosinophils Absolute: 0.4 10*3/uL (ref 0.0–0.5)
Eosinophils Relative: 3 %
HCT: 28.7 % — ABNORMAL LOW (ref 36.0–46.0)
Hemoglobin: 8.8 g/dL — ABNORMAL LOW (ref 12.0–15.0)
Immature Granulocytes: 1 %
Lymphocytes Relative: 25 %
Lymphs Abs: 2.7 10*3/uL (ref 0.7–4.0)
MCH: 23.8 pg — ABNORMAL LOW (ref 26.0–34.0)
MCHC: 30.7 g/dL (ref 30.0–36.0)
MCV: 77.8 fL — ABNORMAL LOW (ref 80.0–100.0)
Monocytes Absolute: 0.9 10*3/uL (ref 0.1–1.0)
Monocytes Relative: 9 %
Neutro Abs: 6.6 10*3/uL (ref 1.7–7.7)
Neutrophils Relative %: 61 %
Platelet Count: 472 10*3/uL — ABNORMAL HIGH (ref 150–400)
RBC: 3.69 MIL/uL — ABNORMAL LOW (ref 3.87–5.11)
RDW: 20.9 % — ABNORMAL HIGH (ref 11.5–15.5)
WBC Count: 10.7 10*3/uL — ABNORMAL HIGH (ref 4.0–10.5)
nRBC: 0 % (ref 0.0–0.2)

## 2021-08-21 LAB — CMP (CANCER CENTER ONLY)
ALT: 7 U/L (ref 0–44)
AST: 13 U/L — ABNORMAL LOW (ref 15–41)
Albumin: 3.3 g/dL — ABNORMAL LOW (ref 3.5–5.0)
Alkaline Phosphatase: 141 U/L — ABNORMAL HIGH (ref 38–126)
Anion gap: 8 (ref 5–15)
BUN: 11 mg/dL (ref 8–23)
CO2: 25 mmol/L (ref 22–32)
Calcium: 9 mg/dL (ref 8.9–10.3)
Chloride: 104 mmol/L (ref 98–111)
Creatinine: 0.77 mg/dL (ref 0.44–1.00)
GFR, Estimated: >60 mL/min (ref >60)
Glucose, Bld: 114 mg/dL — ABNORMAL HIGH (ref 70–99)
Potassium: 4.6 mmol/L (ref 3.5–5.1)
Sodium: 137 mmol/L (ref 135–145)
Total Bilirubin: 0.5 mg/dL (ref 0.3–1.2)
Total Protein: 8.3 g/dL — ABNORMAL HIGH (ref 6.5–8.1)

## 2021-08-21 LAB — VITAMIN B12: Vitamin B-12: 142 pg/mL — ABNORMAL LOW (ref 180–914)

## 2021-08-21 LAB — IRON AND IRON BINDING CAPACITY (CC-WL,HP ONLY)
Iron: 32 ug/dL (ref 28–170)
Saturation Ratios: 12 % (ref 10.4–31.8)
TIBC: 277 ug/dL (ref 250–450)
UIBC: 245 ug/dL (ref 148–442)

## 2021-08-21 LAB — RETIC PANEL
Immature Retic Fract: 21.2 % — ABNORMAL HIGH (ref 2.3–15.9)
RBC.: 3.63 MIL/uL — ABNORMAL LOW (ref 3.87–5.11)
Retic Count, Absolute: 62.4 10*3/uL (ref 19.0–186.0)
Retic Ct Pct: 1.7 % (ref 0.4–3.1)
Reticulocyte Hemoglobin: 26.3 pg — ABNORMAL LOW (ref >27.9)

## 2021-08-21 LAB — FERRITIN: Ferritin: 103 ng/mL (ref 11–307)

## 2021-08-21 NOTE — Progress Notes (Addendum)
[facility]   [phone]) [phone] [phone]) [phone]   Clinic New consult Note   Patient Care Team: Raymon Mutton., FNP as PCP - General (Family Medicine) Runell Gess, MD as PCP - Cardiology (Cardiology) Frederico Hamman, MD as Consulting Physician (Orthopedic Surgery) Malachy Mood, MD as Consulting Physician (Hematology) Pollyann Samples, NP as Nurse Practitioner (Nurse Practitioner) Raymon Mutton., FNP as Nurse Practitioner (Family Medicine) Tia Alert, MD as Consulting Physician (Neurosurgery) 08/21/2021  CHIEF COMPLAINTS/PURPOSE OF CONSULTATION:  Anemia, referred by PCP Fatima Sanger, FNP  HISTORY OF PRESENTING ILLNESS:  GRINDLE CANTARERO 71 y.o. female with past medical history including HTN, CAD, carotid artery stenosis/occlusion, varicose veins of the legs, HL, DM, diabetic neuropathy, gout, degenerative joint disease, and sciatica, is here because of anemia.  She was first found to have mild normocytic anemia with hemoglobin 11 on 04/25/2009 during PCP visit for sore throat and fever, hgb remained in the 11-11.9 range in 2013 when she was found to have carotid occlusive disease and underwent carotid endarterectomy.  There is no CBC in epic until 08/23/2018 with hgb 10.0 during hospitalization for left total hip replacement secondary to severe arthritis.  Anemia resolved after that and CBC was normal from 12/16/2018 - 04/17/2020.  She underwent neurosurgery for lumbar laminectomy and fusion by Dr. Marikay Alar 04/25/2020 and became anemic again on CBC 05/24/2020 while hospitalized for a MRSA positive wound infection, with leukocytosis, hemoglobin 8.1, HCT 26, normal MCV, and elevated RDW.  Lowest Hgb at that time was 6.4.  She was transfused 2 units PRBCs on 05/27/2020.  Hemoglobin at discharge was 9.6.  She was admitted again 11/30/2020 for another neurosurgery on her back, again by Dr. Yetta Barre, with hemoglobin 7.4-10.9 during that hospitalization, she was transfused  additional 2 units on 12/01/2020.  Most recent CBC 06/07/2021 showed hemoglobin 9.8, HCT 32.5, MCV 79.5 (normal is 80-100).  CMP showed elevated total protein 8.9, low albumin 2.9, AST 13, normal serum creatinine.  Hemoglobin A1c of 7.3.  She was started on oral iron ferrous sulfate about 1.5 months ago, tolerating with some dark stools.  Labs 08/01/2021 show serum iron 35, TIBC 260, 13% saturation, and ferritin 164.  Creatinine was normal.  She again had an elevated total blood protein at 8.4 and elevated globulin 5.2.  Hemoglobin remained normal at 9.7.  Most recently she underwent US guided cannulation of left common femoral artery and stenting of SFA by Dr. Randie Heinz on 08/07/21, hgb 10.5. She was referred to hematology for further management and to improve anemia prior to right hip surgery.  Socially, she lives with her significant other, they have been together many years. She is retired working at [employer]. She is independent with ADLs and up to date on cancer screenings (GI Dr. Elnoria Howard?, mammogram 07/2021 result is pending, s/p hysterectomy in her 30's for heavy periods but reports no anemia at that time). She has 2 sons who have HTN. She drinks 1-2 glasses of alcohol every other day. She has smoked cigarettes for 50 years, 1 pack may last 3 days, she has cut to 4 cigarettes per day. Denies other drug use. Denies family history of blood disorder or cancer.   Today, she presents by herself. She feels well in general. Has been on oral iron 1.5 months tolerating well with dark stools. Bowels moving well. Energy and appetite are normal now, she lost weight after last surgery in 11/2020 but has gained it back. She has right hip  pain and is hoping to get hip replacement soon.   She denies recent chest pain on exertion, shortness of breath on minimal exertion, pre-syncopal episodes, or palpitations. She had not noticed any recent bleeding such as epistaxis, hematuria or hematochezia.The patient denies over the counter  NSAID ingestion. She is on aspirin and plavix. Her last colonoscopy was 3 years ago. She had no prior history or diagnosis of cancer. Her age appropriate screening programs are up-to-date. She denies any pica and eats a variety of diet.   MEDICAL HISTORY:  Past Medical History:  Diagnosis Date   Anemia    Arthritis    Blood transfusion without reported diagnosis    Carotid artery narrowing    Coronary artery disease    Cardiac catheterization November 2013: 50% ostial LAD stenosis 50% mid stenosis. 30% disease in the left circumflex.   Diabetes mellitus, type 2 (HCC)    Hyperlipidemia    Hypertension    Onychomycosis of toenail 07/31/2016   Sleep apnea       Had surgery to correct    SURGICAL HISTORY: Past Surgical History:  Procedure Laterality Date   ABDOMINAL AORTOGRAM W/LOWER EXTREMITY N/A 08/07/2021   Procedure: ABDOMINAL AORTOGRAM W/LOWER EXTREMITY;  Surgeon: Maeola Harman, MD;  Location: [facility] INVASIVE CV LAB;  Service: Cardiovascular;  Laterality: N/A;   ABDOMINAL HYSTERECTOMY     APPLICATION OF INTRAOPERATIVE CT SCAN N/A 11/30/2020   Procedure: APPLICATION OF INTRAOPERATIVE CT SCAN;  Surgeon: Tia Alert, MD;  Location: [facility] OR;  Service: Neurosurgery;  Laterality: N/A;   BACK SURGERY     CARDIAC CATHETERIZATION     CATARACT EXTRACTION, BILATERAL Bilateral 2021   Dr. Joseph Art   ENDARTERECTOMY  09/27/2011   Procedure: RIGT ENDARTERECTOMY CAROTID;  Surgeon: Pryor Ochoa, MD;  Location: [facility] OR;  Service: Vascular;  Laterality: Right;   EPIDURAL BLOCK INJECTION  02/2008   Drs. Dorthula Nettles   gyn surgery  2004   total hysterectomy for mennorhagia,,salpingoophorectomy   LAMINECTOMY WITH POSTERIOR LATERAL ARTHRODESIS LEVEL 2 N/A 11/30/2020   Procedure: LUMBAR FOUR-FIVE, LUMBAR FIVE-SACRAL ONE POSTERIOR LATERAL FUSION WITH REVISION OF LUMBAR ONE-FIVE HARDWARE AND EXTENSION TO SACRAL ONE AND SACRAL TWO;  Surgeon: Tia Alert, MD;  Location: [facility] OR;  Service:  Neurosurgery;  Laterality: N/A;   LUMBAR WOUND DEBRIDEMENT N/A 05/25/2020   Procedure: LUMBAR WOUND IRRIGATION AND DEBRIDEMENT;  Surgeon: Tia Alert, MD;  Location: [facility] OR;  Service: Neurosurgery;  Laterality: N/A;   PERIPHERAL VASCULAR INTERVENTION  08/07/2021   Procedure: PERIPHERAL VASCULAR INTERVENTION;  Surgeon: Maeola Harman, MD;  Location: [facility] INVASIVE CV LAB;  Service: Cardiovascular;;  Rt SFA   POSTERIOR LUMBAR FUSION 4 LEVEL N/A 04/25/2020   Procedure: POSTERIOR LUMBAR INTERBODY FUSION LUMBAR ONE-TWO, LUMBAR TWO-THREE, LUMBAR THREE-FOUR,LUMBAR FOUR-FIVE.;  Surgeon: Tia Alert, MD;  Location: [facility] OR;  Service: Neurosurgery;  Laterality: N/A;  posterior   TONSILLECTOMY     TOTAL ABDOMINAL HYSTERECTOMY W/ BILATERAL SALPINGOOPHORECTOMY Bilateral 2000   TOTAL HIP ARTHROPLASTY Right 08/22/2018   Procedure: TOTAL HIP ARTHROPLASTY;  Surgeon: Frederico Hamman, MD;  Location: [facility] ORS;  Service: Orthopedics;  Laterality: Right;   VARICOSE VEIN SURGERY  2008   stripping    SOCIAL HISTORY: Social History   Socioeconomic History   Marital status: Significant Other    Spouse name: Not on file   Number of children: 2   Years of education: 12   Highest education level: Not on file  Occupational History   Occupation:  housekeeping-retired; 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    Comment: [employer]-retired 07/23/2014  Tobacco Use   Smoking status: Every Day    Packs/day: 0.10    Years: 50.00    Total pack years: 5.00    Types: Cigarettes    Start date: 05/31/1968   Smokeless tobacco: Never   Tobacco comments:    Smoked x50 years, 1 pack would last 3 days. Currently smoking 4 cigarettes per day  Vaping Use   Vaping Use: Never used  Substance and Sexual Activity   Alcohol use: Yes    Comment: 1-2 drinks every other day   Drug use: No   Sexual activity: Not on file  Other Topics Concern   Not on file  Social History Narrative   Lives with long term boyfriend (30  years together)   Both sons live in [place]   Social Determinants of Health   Financial Resource Strain: Low Risk  (08/06/2018)   Overall Financial Resource Strain (CARDIA)    Difficulty of Paying Living Expenses: Not hard at all  Food Insecurity: No Food Insecurity (08/07/2019)   Hunger Vital Sign    Worried About Running Out of Food in the Last Year: Never true    Ran Out of Food in the Last Year: Never true  Transportation Needs: No Transportation Needs (08/07/2019)   PRAPARE - Administrator, Civil Service (Medical): No    Lack of Transportation (Non-Medical): No  Physical Activity: Inactive (06/17/2017)   Exercise Vital Sign    Days of Exercise per Week: 0 days    Minutes of Exercise per Session: 0 min  Stress: No Stress Concern Present (06/17/2017)   [employer] of Occupational Health - Occupational Stress Questionnaire    Feeling of Stress : Not at all  Social Connections: Unknown (08/06/2018)   Social Connection and Isolation Panel [NHANES]    Frequency of Communication with Friends and Family: Not on file    Frequency of Social Gatherings with Friends and Family: Not on file    Attends Religious Services: Not on file    Active Member of Clubs or Organizations: Not on file    Attends Banker Meetings: Not on file    Marital Status: Living with partner  Intimate Partner Violence: Not At Risk (08/07/2019)   Humiliation, Afraid, Rape, and Kick questionnaire    Fear of Current or Ex-Partner: No    Emotionally Abused: No    Physically Abused: No    Sexually Abused: No    FAMILY HISTORY: Family History  Problem Relation Age of Onset   Hypertension Sister    Hypothyroidism Sister    Cancer Maternal Grandmother        lung   Hypertension Son    Hypertension Sister    Diabetes Brother        lost toe in 2018 with new diagnosis of DM   Hypertension Son     ALLERGIES:  is allergic to zestoretic [lisinopril-hydrochlorothiazide], codeine,  oxycodone, ultram [tramadol], and zocor [simvastatin].  MEDICATIONS:  Current Outpatient Medications  Medication Sig Dispense Refill   allopurinol (ZYLOPRIM) 300 MG tablet Take 1 tablet (300 mg total) by mouth daily. 90 tablet 3   amLODipine (NORVASC) 10 MG tablet Take 10 mg by mouth in the morning.     aspirin EC 81 MG tablet Take 81 mg by mouth in the morning.     atorvastatin (LIPITOR) 80 MG tablet TAKE 1 TABLET BY MOUTH ONCE  DAILY WITH  EVENING  MEAL (Patient taking differently: Take 80 mg by mouth in the morning.) 90 tablet 3   blood glucose meter kit and supplies KIT Check sugars twice daily 1 each 0   carvedilol (COREG CR) 20 MG 24 hr capsule Take 1 capsule (20 mg total) by mouth daily. 90 capsule 3   clopidogrel (PLAVIX) 75 MG tablet Take 75 mg by mouth in the morning.     ezetimibe (ZETIA) 10 MG tablet TAKE 1 TABLET EVERY DAY (NEED MD APPOINTMENT) 90 tablet 0   ferrous sulfate 325 (65 FE) MG tablet Take 325 mg by mouth in the morning and at bedtime.     gabapentin (NEURONTIN) 300 MG capsule Take 300 mg by mouth 3 (three) times daily.     glucose blood test strip Check sugars twice daily 100 each 11   metFORMIN (GLUCOPHAGE) 500 MG tablet Take 500 mg by mouth 2 (two) times daily with a meal.     oxyCODONE-acetaminophen (PERCOCET) 10-325 MG tablet Take 1 tablet by mouth every 4 (four) hours as needed for pain. 40 tablet 0   No current facility-administered medications for this visit.    REVIEW OF SYSTEMS:   Constitutional: Denies fatigue, fevers, chills, abnormal night sweats, or unintentional weight loss Eyes: Denies blurriness of vision, double vision or watery eyes Ears, nose, mouth, throat, and face: Denies mucositis or sore throat or epistaxis  Respiratory: Denies cough, dyspnea or wheezes Cardiovascular: Denies palpitation, chest discomfort or lower extremity swelling Gastrointestinal:  Denies nausea, vomiting, constipation, diarrhea, hematochezia, pain, heartburn or change in  bowel habits Skin: Denies abnormal skin rashes Lymphatics: Denies new lymphadenopathy or easy bruising  Neurological:Denies  new weaknesses (+) diabetic neuropathy Behavioral/Psych: Mood is stable, no new changes  MSK: (+) R hip pain (+) s/p left hip replacement and 2 back surgeries  All other systems were reviewed with the patient and are negative.  PHYSICAL EXAMINATION: ECOG PERFORMANCE STATUS: 1 - Symptomatic but completely ambulatory  Vitals:   08/21/21 1110  BP: (!) 171/69  Pulse: 88  Resp: 16  Temp: 97.7 F (36.5 C)  SpO2: 100%   Filed Weights   08/21/21 1110  Weight: 141 lb 12.8 oz (64.3 kg)    GENERAL:alert, no distress and comfortable SKIN: no rash  EYES: sclera clear NECK: without rash  LYMPH:  no palpable cervical or supraclavicular lymphadenopathy  LUNGS: clear with normal breathing effort HEART: regular rate & rhythm, no lower extremity edema ABDOMEN:abdomen soft, non-tender and normal bowel sounds Musculoskeletal:no cyanosis of digits and no clubbing  PSYCH: alert & oriented x 3 with fluent speech NEURO: no focal motor/sensory deficits  LABORATORY DATA:  I have reviewed the data as listed    Latest Ref Rng & Units 08/07/2021    1:16 PM 06/07/2021    9:58 AM 12/01/2020   10:00 PM  CBC  WBC 4.0 - 10.5 K/uL  10.0    Hemoglobin 12.0 - 15.0 g/dL 16.1  9.8  09.6   Hematocrit 36.0 - 46.0 % 31.0  32.5  33.3   Platelets 150 - 400 K/uL  368         Latest Ref Rng & Units 08/07/2021    1:16 PM 06/07/2021    9:58 AM 12/27/2020   11:08 AM  CMP  Glucose 70 - 99 mg/dL 045  409  811   BUN 8 - 23 mg/dL 10  13  17    Creatinine 0.44 - 1.00 mg/dL 9.14  7.82  9.56  Sodium 135 - 145 mmol/L 140  136  135   Potassium 3.5 - 5.1 mmol/L 4.6  4.0  4.4   Chloride 98 - 111 mmol/L 103  106  103   CO2 22 - 32 mmol/L  25  22   Calcium 8.9 - 10.3 mg/dL  8.9  9.2   Total Protein 6.5 - 8.1 g/dL  8.9    Total Bilirubin 0.3 - 1.2 mg/dL  0.3    Alkaline Phos 38 - 126 U/L  108     AST 15 - 41 U/L  13    ALT 0 - 44 U/L  9       RADIOGRAPHIC STUDIES: I have personally reviewed the radiological images as listed and agreed with the findings in the report. MM 3D SCREEN BREAST BILATERAL  Result Date: 08/21/2021 CLINICAL DATA:  Screening. EXAM: DIGITAL SCREENING BILATERAL MAMMOGRAM WITH TOMOSYNTHESIS AND CAD TECHNIQUE: Bilateral screening digital craniocaudal and mediolateral oblique mammograms were obtained. Bilateral screening digital breast tomosynthesis was performed. The images were evaluated with computer-aided detection. COMPARISON:  Previous exam(s). ACR Breast Density Category a: The breast tissue is almost entirely fatty. FINDINGS: In the left breast, calcifications warrant further evaluation. In the right breast, no findings suspicious for malignancy. IMPRESSION: Further evaluation is suggested for calcifications in the left breast. RECOMMENDATION: Diagnostic mammogram of the left breast. (Code:[id number]) The patient will be contacted regarding the findings, and additional imaging will be scheduled. BI-RADS CATEGORY  0: Incomplete. Need additional imaging evaluation and/or prior mammograms for comparison. Electronically Signed   By: Elberta Fortis M.D.   On: 08/21/2021 10:04   PERIPHERAL VASCULAR CATHETERIZATION  Result Date: 08/07/2021 Images from the original result were not included. Patient name: RIFKY KASTOR MRN: [id number] DOB: 1950-10-08 Sex: female 08/07/2021 Pre-operative Diagnosis: Right lower extremity rest pain Post-operative diagnosis:  Same Surgeon:  [name] C. Randie Heinz, MD Procedure Performed: 1.  Ultrasound-guided cannulation left common femoral artery 2.  Aortogram with bilateral lower extremity runoff 3.  Ultrasound-guided cannulation right posterior tibial artery 4.  Stent of right SFA with a 6 x 150 mm Elluvia 5.  Mynx device closure left common femoral artery 6.  Moderate sedation with fentanyl Versed 470 minutes Indications: 71 year old female with history  of right lower extremity pain.  She has a severely depressed toe pressure 31 with biphasic waveforms and duplex evidence of SFA occlusion.  She is indicated for angiography with possible invention. Findings: Aorta and iliac segments do not appear to have any flow-limiting stenosis however her significant previous spinal fusions do limit the ability to fully evaluate.  Bilateral common femoral arteries are patent.  On the left side the SFA is patent as is the popliteal although diseased there appears to be two-vessel runoff via the anterior tibial and posterior tibial.  On the right side the SFA has approximately 50% nonflow limiting stenosis proximally and then distally occludes for a segment of at least 10 cm.  The runoff is via the anterior tibial and posterior tibial.  After posterior tibial cannulation we were able to snare wire through and through primarily stented the SFA occlusion and at completion there was an ankle mean arterial pressure through the sheath at the ankle of 90 mmHg.  Mynx device closure was placed in the left common femoral artery. If this stent fails patient would need right common femoral to below-knee popliteal artery bypass  Procedure:  The patient was identified in the holding area and taken to room 8.  The patient was  then placed supine on the table and prepped and draped in the usual sterile fashion.  A time out was called.  Ultrasound was used to evaluate the left common femoral artery.  The artery was patent.  The area was anesthetized 1% lidocaine cannulated micropuncture needle followed by wire and sheath.  Images were saved department record.  Bentson wires placed followed by 5 French sheath and Omni catheter placed with a 1 and aortogram performed followed by bilateral lower extremity runoff.  With the above findings we crossed the bifurcation with Glidewire advantage and crossover catheter placed a long 6 French sheath patient was fully heparinized.  I attempted to cross using  Glidewire and Glidewire advantage and quick cross catheter from above but I could not cross in ultimately entered a dissection plane.  With this the right foot was prepped.  The right posterior tibial artery was identified and cannulated with micropuncture needle followed by wire and a 4 French micro sheath.  I used combination of V18 and 014 wires from below to attempt to cross and ultimately I was able to cross into the true lumen from below with a V18 wire and this was snared and pulled through and through.  I then primarily dilated the occluded segment of the right SFA with a 4 mm balloon and primarily stented with a 6 x 150 mm drug-eluting stent.  This was postdilated first with 4 mm balloon and then with 5 mm balloon.  Completion demonstrated no residual stenosis where previously was occluded.  There was a mean arterial ankle pressure of 90 mmHg.  Satisfied with this we exchanged the wire to a Bentson wire and exchanged for short 6 French sheath deployed a minx device.  The ankle sheath was removed and pressure held.  There were very strong posterior tibial and dorsalis pedis signals at the ankle.  She tolerated procedure without any complication Contrast: 130cc Brandon C. Randie Heinz, MD [facility] Office: [phone] Pager: [phone]   VAS [place] LOWER EXTREMITY ARTERIAL DUPLEX  Result Date: 08/02/2021 LOWER EXTREMITY ARTERIAL DUPLEX STUDY Patient Name:  ODESTER HARKINS  Date of Exam:   08/02/2021 Medical Rec #: [id number]         Accession #:    [id number] Date of Birth: [birth year]          Patient Gender: F Patient Age:   5 years Exam Location:  [facility] Procedure:      VAS [place] LOWER EXTREMITY ARTERIAL DUPLEX Referring Phys: Lemar Livings --------------------------------------------------------------------------------  Indications: Peripheral artery disease. High Risk Factors: Hypertension, hyperlipidemia, Diabetes, past history of                    smoking.  Other Factors: Back surgery x 3, last one October 2022. Patient complains of                left toe nail discoloration.  Vascular Interventions: Patient complains of right foot numbness. Current ABI:            Right: 0.68, Left: 0.96 Performing Technologist: Hardie Lora RVT  Examination Guidelines: A complete evaluation includes B-mode imaging, spectral Doppler, color Doppler, and power Doppler as needed of all accessible portions of each vessel. Bilateral testing is considered an integral part of a complete examination. Limited examinations for reoccurring indications may be performed as noted.  +----------+--------+-----+--------+----------+--------+ RIGHT     PSV cm/sRatioStenosisWaveform  Comments +----------+--------+-----+--------+----------+--------+ CFA [id number]  triphasic          +----------+--------+-----+--------+----------+--------+ DFA       74                   biphasic           +----------+--------+-----+--------+----------+--------+ SFA Prox  75                   biphasic           +----------+--------+-----+--------+----------+--------+ SFA Mid   80                   monophasic         +----------+--------+-----+--------+----------+--------+ SFA Distal0            occluded                   +----------+--------+-----+--------+----------+--------+ POP Prox  36                   biphasic           +----------+--------+-----+--------+----------+--------+ POP Distal53                   biphasic           +----------+--------+-----+--------+----------+--------+ ATA Distal29                   monophasic         +----------+--------+-----+--------+----------+--------+ PTA Distal34                   monophasic         +----------+--------+-----+--------+----------+--------+ A focal velocity elevation of 240 cm/s was obtained at proximal to mid SFA with a VR of 2.7. Findings are characteristic of 50-74% stenosis.  Heavily calcified plaque with large collateral vessel seen at distal SFA. No flow seen within the distal suggests  occlusion, however may also be acoustic shadowing.   Summary: Right: Total occlusion noted in the superficial femoral artery. Probable distal right SFA occlusion.  See table(s) above for measurements and observations. Electronically signed by Lemar Livings MD on 08/02/2021 at 11:21:32 AM.    Final    VAS [place] ABI WITH/WO TBI  Result Date: 08/02/2021  LOWER EXTREMITY DOPPLER STUDY Patient Name:  MELE OKLAND  Date of Exam:   08/02/2021 Medical Rec #: [id number]         Accession #:    [id number] Date of Birth: 1950-09-20          Patient Gender: F Patient Age:   37 years Exam Location:  [facility] Procedure:      VAS [place] ABI WITH/WO TBI Referring Phys: Lemar Livings --------------------------------------------------------------------------------  Indications: Peripheral artery disease. High Risk         Hypertension, hyperlipidemia, Diabetes, past history of Factors:          smoking. Other Factors: Back surgery x 3, last one October 2022. Patient complains of                left toe nail discoloration. Right foot numbness.  Performing Technologist: Hardie Lora RVT  Examination Guidelines: A complete evaluation includes at minimum, Doppler waveform signals and systolic blood pressure reading at the level of bilateral brachial, anterior tibial, and posterior tibial arteries, when vessel segments are accessible. Bilateral testing is considered an integral part of a complete examination. Photoelectric Plethysmograph (PPG) waveforms and toe systolic pressure readings are included as required and additional duplex testing as needed. Limited examinations for  reoccurring indications may be performed as noted.  ABI Findings: +---------+------------------+-----+--------+--------+ Right    Rt Pressure (mmHg)IndexWaveformComment  +---------+------------------+-----+--------+--------+  Brachial 171                                     +---------+------------------+-----+--------+--------+ PTA      117               0.68 biphasic         +---------+------------------+-----+--------+--------+ DP       112               0.65 biphasic         +---------+------------------+-----+--------+--------+ Great Toe31                0.18                  +---------+------------------+-----+--------+--------+ +---------+------------------+-----+--------+-------+ Left     Lt Pressure (mmHg)IndexWaveformComment +---------+------------------+-----+--------+-------+ Brachial 166                                    +---------+------------------+-----+--------+-------+ PTA      165               0.96 biphasic        +---------+------------------+-----+--------+-------+ DP       164               0.96 biphasic        +---------+------------------+-----+--------+-------+ Great Toe92                0.54                 +---------+------------------+-----+--------+-------+ +-------+-----------+-----------+------------+------------+ ABI/TBIToday's ABIToday's TBIPrevious ABIPrevious TBI +-------+-----------+-----------+------------+------------+ Right  0.68       0.18       0.74        0.47         +-------+-----------+-----------+------------+------------+ Left   0.96       0.54       1.21        0.66         +-------+-----------+-----------+------------+------------+ Right ABIs appear essentially unchanged compared to prior study on 01/06/2022. Left ABIs appear decreased compared to prior study on 01/06/2022.  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 The right toe-brachial index is abnormal. Left: Resting left ankle-brachial index is within normal range. No evidence of significant left lower extremity arterial disease. The left toe-brachial index is abnormal. *See table(s) above for  measurements and observations.  Electronically signed by Lemar Livings MD on 08/02/2021 at 10:52:36 AM.    Final     ASSESSMENT & PLAN: 71 yo female   Anemia, unspecified type -We reviewed her detailed medical record with the patient. She has had mild intermittent anemia with hgb 9-11 range since at least 2011. She had acute severe anemia with hgb 6.4 in 04/2020 while hospitalized for post-op MRSA+ wound infection after lumbar surgery, and again hgb 7.4 in 11/2020 while hospitalized for a second neurosurgery. She received 2 units pRBCs with each of these episodes.  -her anemia resolved briefly from 2020-2022, recently has been stable 9.5 -10.5 range -iron panel 08/01/21 showed serum iron 35 (low) TIBC 260 (low-normal), 13% transferrin (low), and ferritin 164 (normal); these levels indicate anemia of chronic disease rather than true iron deficiency  -we will obtain  labs today to r/o nutritional anemia, hemolysis and multiple myeloma/MGUS (given elevated protein/globulin since 05/2021). There is no strong suspicion for occult malignancy.  -her anemia is likely multifactorial, from multiple surgeries and infection, and from anemia of chronic disease, although she does not have CKD. -we discussed the treatment for anemia of chronic disease is to manage chronic conditions with tight BP/BG control and manage HL and other issues. She is adherent to med regimen and follows closely with her care team. -we discussed possibly starting ESA if hgb consistently <9 -we discussed she may require blood transfusion to get her hgb to goal for right hip replacement, but will defer to ortho Dr. Madelon Lips.  -we will follow up on her labs and call her in 1-2 weeks once all results are back. Will cc results to her care team  -we will continue to follow her couple times a year -Pt seen with Dr. Mosetta Putt   Co-morbidities: HTN, HL, DM, CAD, carotid artery stenosis/occlusion, OA, multiple back surgeries and post op infection -Her anemia  worsened after neurosurgery and post-op infection, s/p blood transfusion in April and October 2022 -she likely has anemia of chronic disease  -continue PCP, ortho, neurosurgery, cardiology, and vascular surgery f/up   Health maintenance  -We reviewed other causes for anemia including nutritional anemia and from occult malignancy -she drinks alcohol and smokes cigarettes, we reviewed cessation. Will check B12, folate, and MMA to r/o nutritional anemia. She is reportedly UTD on mammogram and colonoscopy    PLAN: -medical record reviewed -labs today -phone f/up with labs results in 10-14 days -cc note to PCP, ortho, and care team  Orders Placed This Encounter  Procedures   CBC with Differential ([facility] Center Only)    Standing Status:   Standing    Number of Occurrences:   1    Standing Expiration Date:   08/22/2022   CMP (Cancer Center only)    Standing Status:   Standing    Number of Occurrences:   1    Standing Expiration Date:   08/22/2022   Ferritin    Standing Status:   Standing    Number of Occurrences:   1    Standing Expiration Date:   08/22/2022   Iron and Iron Binding Capacity ([facility] only)    Standing Status:   Standing    Number of Occurrences:   1    Standing Expiration Date:   08/22/2022   Retic Panel    Standing Status:   Standing    Number of Occurrences:   1    Standing Expiration Date:   08/22/2022   Folate RBC    Standing Status:   Standing    Number of Occurrences:   1    Standing Expiration Date:   08/22/2022   Vitamin B12    Standing Status:   Standing    Number of Occurrences:   1    Standing Expiration Date:   08/22/2022   Methylmalonic acid, serum    Standing Status:   Standing    Number of Occurrences:   1    Standing Expiration Date:   08/22/2022   Kappa/lambda light chains    Standing Status:   Standing    Number of Occurrences:   1    Standing Expiration Date:   08/22/2022   Multiple Myeloma Panel (SPEP&IFE w/QIG)    Standing Status:    Standing    Number of Occurrences:   1    Standing Expiration Date:  08/22/2022   24 Hr Ur Kappa/Lambda/Light Chains, Free w Ratio    Standing Status:   Standing    Number of Occurrences:   1    Standing Expiration Date:   08/22/2022     All questions were answered. The patient knows to call the clinic with any problems, questions or concerns.     Pollyann Samples, NP 08/21/21   Addendum  I have seen the patient, examined her. I agree with the assessment and and plan and have edited the notes.   71 year old female with past medical history of diabetes, hypertension, dyslipidemia, coronary artery disease, back surgeries, who was referred for anemia, her recent right hip surgery was canceled due to her anemia.  I reviewed her lab, she has had chronic anemia for more than 10 years, got worse after surgery and infection, most recent hemoglobin was 9.8.  Iron studies showed normal ferritin, low iron saturation and TIBC, most consistent with anemia of chronic disease, although she may have a component of iron deficiency.  We will repeat her labs today, also include as a work-up to rule out multiple myeloma due to elevated total protein.  We discussed anemia treatment, role of IV iron, and EPO for anemia of chronic disease.  She has been taking oral iron for months.  We will call her with lab results and recommendations.  All questions were answered.  Malachy Mood  08/21/2021

## 2021-08-22 ENCOUNTER — Other Ambulatory Visit: Payer: Self-pay | Admitting: Family

## 2021-08-22 ENCOUNTER — Telehealth: Payer: Self-pay | Admitting: Hematology

## 2021-08-22 DIAGNOSIS — R928 Other abnormal and inconclusive findings on diagnostic imaging of breast: Secondary | ICD-10-CM

## 2021-08-22 LAB — KAPPA/LAMBDA LIGHT CHAINS
Kappa free light chain: 130.7 mg/L — ABNORMAL HIGH (ref 3.3–19.4)
Kappa, lambda light chain ratio: 1.34 (ref 0.26–1.65)
Lambda free light chains: 97.2 mg/L — ABNORMAL HIGH (ref 5.7–26.3)

## 2021-08-22 LAB — FOLATE RBC
Folate, Hemolysate: 309 ng/mL
Folate, RBC: 1080 ng/mL (ref >498)
Hematocrit: 28.6 % — ABNORMAL LOW (ref 34.0–46.6)

## 2021-08-23 ENCOUNTER — Other Ambulatory Visit: Payer: Self-pay

## 2021-08-23 DIAGNOSIS — D649 Anemia, unspecified: Secondary | ICD-10-CM

## 2021-08-23 LAB — METHYLMALONIC ACID, SERUM: Methylmalonic Acid, Quantitative: 469 nmol/L — ABNORMAL HIGH (ref 0–378)

## 2021-08-24 ENCOUNTER — Telehealth: Payer: Self-pay

## 2021-08-24 ENCOUNTER — Telehealth: Payer: Self-pay | Admitting: Nurse Practitioner

## 2021-08-24 LAB — KAPPA/LAMBDA LIGHT CHAINS, FREE, WITH RATIO, 24HR. URINE
FR KAPPA LT CH,24HR: 88.37 mg/24 hr
FR LAMBDA LT CH,24HR: 12.67 mg/24 hr
Free Kappa Lt Chains,Ur: 147.28 mg/L — ABNORMAL HIGH (ref 1.17–86.46)
Free Kappa/Lambda Ratio: 6.97 (ref 1.83–14.26)
Free Lambda Lt Chains,Ur: 21.12 mg/L — ABNORMAL HIGH (ref 0.27–15.21)
Total Volume: 600

## 2021-08-24 NOTE — Telephone Encounter (Signed)
Para March in the lab called pt to have her [place] appt moved up an hour. The pt proceeded to ask him about her foot swelling. He referred her to me.  Reviewed pt's chart, returned pt's call, two identifiers used. Pt's feet swelling has improved and she is still elevating them often. Informed pt that there were no earlier appts available at this time. Instructed her to continue elevation, stay well hydrated, and come into the office to be fitted for compression stockings. Pt confirmed understanding.

## 2021-08-24 NOTE — Telephone Encounter (Signed)
I called Ms. Hanlon with preliminary results. Her anemia has worsened and she has evidence of B12 deficiency (low B12 and elevated MMA). I recommend to start oral B12 vitamin 1000 mcg and start injections. Will change 7/6 phone visit to in person and begin injections. She agrees. I sent a schedule msg.   Santiago Glad, NP

## 2021-08-25 ENCOUNTER — Telehealth: Payer: Self-pay | Admitting: Nurse Practitioner

## 2021-08-25 LAB — MULTIPLE MYELOMA PANEL, SERUM
Albumin SerPl Elph-Mcnc: 2.7 g/dL — ABNORMAL LOW (ref 2.9–4.4)
Albumin/Glob SerPl: 0.6 — ABNORMAL LOW (ref 0.7–1.7)
Alpha 1: 0.4 g/dL (ref 0.0–0.4)
Alpha2 Glob SerPl Elph-Mcnc: 0.9 g/dL (ref 0.4–1.0)
B-Globulin SerPl Elph-Mcnc: 1.2 g/dL (ref 0.7–1.3)
Gamma Glob SerPl Elph-Mcnc: 2.5 g/dL — ABNORMAL HIGH (ref 0.4–1.8)
Globulin, Total: 4.9 g/dL — ABNORMAL HIGH (ref 2.2–3.9)
IgA: 520 mg/dL — ABNORMAL HIGH (ref 87–352)
IgG (Immunoglobin G), Serum: 2367 mg/dL — ABNORMAL HIGH (ref 586–1602)
IgM (Immunoglobulin M), Srm: 80 mg/dL (ref 26–217)
Total Protein ELP: 7.6 g/dL (ref 6.0–8.5)

## 2021-08-25 NOTE — Telephone Encounter (Signed)
.  Called patient to schedule appointment per 6/29 inbasket, left pt msg

## 2021-08-30 ENCOUNTER — Ambulatory Visit
Admission: RE | Admit: 2021-08-30 | Discharge: 2021-08-30 | Disposition: A | Discharge status: Home / Self Care | Payer: Medicare HMO | Source: Ambulatory Visit | Attending: Family | Admitting: Family

## 2021-08-30 DIAGNOSIS — R928 Other abnormal and inconclusive findings on diagnostic imaging of breast: Secondary | ICD-10-CM

## 2021-08-31 ENCOUNTER — Other Ambulatory Visit: Payer: Self-pay

## 2021-08-31 ENCOUNTER — Inpatient Hospital Stay: Payer: Medicare HMO | Attending: Nurse Practitioner | Admitting: Nurse Practitioner

## 2021-08-31 ENCOUNTER — Telehealth: Payer: Self-pay | Admitting: Nurse Practitioner

## 2021-08-31 ENCOUNTER — Inpatient Hospital Stay: Payer: Medicare HMO

## 2021-08-31 ENCOUNTER — Encounter: Payer: Self-pay | Admitting: Nurse Practitioner

## 2021-08-31 ENCOUNTER — Other Ambulatory Visit: Payer: Self-pay | Admitting: Nurse Practitioner

## 2021-08-31 VITALS — BP 142/65 | HR 79 | Temp 97.5°F | Wt 141.3 lb

## 2021-08-31 DIAGNOSIS — Z7984 Long term (current) use of oral hypoglycemic drugs: Secondary | ICD-10-CM | POA: Insufficient documentation

## 2021-08-31 DIAGNOSIS — D649 Anemia, unspecified: Secondary | ICD-10-CM

## 2021-08-31 DIAGNOSIS — Z7902 Long term (current) use of antithrombotics/antiplatelets: Secondary | ICD-10-CM | POA: Diagnosis not present

## 2021-08-31 DIAGNOSIS — E538 Deficiency of other specified B group vitamins: Secondary | ICD-10-CM

## 2021-08-31 DIAGNOSIS — I10 Essential (primary) hypertension: Secondary | ICD-10-CM | POA: Insufficient documentation

## 2021-08-31 DIAGNOSIS — F17210 Nicotine dependence, cigarettes, uncomplicated: Secondary | ICD-10-CM | POA: Diagnosis not present

## 2021-08-31 DIAGNOSIS — I2510 Atherosclerotic heart disease of native coronary artery without angina pectoris: Secondary | ICD-10-CM | POA: Diagnosis not present

## 2021-08-31 DIAGNOSIS — G4730 Sleep apnea, unspecified: Secondary | ICD-10-CM | POA: Diagnosis not present

## 2021-08-31 DIAGNOSIS — E785 Hyperlipidemia, unspecified: Secondary | ICD-10-CM | POA: Diagnosis not present

## 2021-08-31 DIAGNOSIS — Z79899 Other long term (current) drug therapy: Secondary | ICD-10-CM | POA: Diagnosis not present

## 2021-08-31 DIAGNOSIS — E119 Type 2 diabetes mellitus without complications: Secondary | ICD-10-CM | POA: Insufficient documentation

## 2021-08-31 LAB — CBC WITH DIFFERENTIAL (CANCER CENTER ONLY)
Abs Immature Granulocytes: 0.04 10*3/uL (ref 0.00–0.07)
Basophils Absolute: 0.1 10*3/uL (ref 0.0–0.1)
Basophils Relative: 1 %
Eosinophils Absolute: 0.3 10*3/uL (ref 0.0–0.5)
Eosinophils Relative: 4 %
HCT: 28.4 % — ABNORMAL LOW (ref 36.0–46.0)
Hemoglobin: 8.7 g/dL — ABNORMAL LOW (ref 12.0–15.0)
Immature Granulocytes: 1 %
Lymphocytes Relative: 29 %
Lymphs Abs: 2.3 10*3/uL (ref 0.7–4.0)
MCH: 24.1 pg — ABNORMAL LOW (ref 26.0–34.0)
MCHC: 30.6 g/dL (ref 30.0–36.0)
MCV: 78.7 fL — ABNORMAL LOW (ref 80.0–100.0)
Monocytes Absolute: 0.7 10*3/uL (ref 0.1–1.0)
Monocytes Relative: 10 %
Neutro Abs: 4.4 10*3/uL (ref 1.7–7.7)
Neutrophils Relative %: 55 %
Platelet Count: 330 10*3/uL (ref 150–400)
RBC: 3.61 MIL/uL — ABNORMAL LOW (ref 3.87–5.11)
RDW: 20.4 % — ABNORMAL HIGH (ref 11.5–15.5)
WBC Count: 7.8 10*3/uL (ref 4.0–10.5)
nRBC: 0 % (ref 0.0–0.2)

## 2021-08-31 MED ORDER — CYANOCOBALAMIN 1000 MCG/ML IJ SOLN
1000.0000 ug | Freq: Once | INTRAMUSCULAR | Status: AC
  Administered 2021-08-31: 1000 ug via INTRAMUSCULAR
  Filled 2021-08-31: qty 1

## 2021-08-31 NOTE — Progress Notes (Signed)
[facility]   Telephone:([phone]) [phone] Fax:(336) [phone]   Clinic Follow up Note   Patient Care Team: Sonia Side., FNP as PCP - General (Family Medicine) Lorretta Harp, MD as PCP - Cardiology (Cardiology) Earlie Server, MD as Consulting Physician (Orthopedic Surgery) Truitt Merle, MD as Consulting Physician (Hematology) Alla Feeling, NP as Nurse Practitioner (Nurse Practitioner) Sonia Side., FNP as Nurse Practitioner (Family Medicine) Eustace Moore, MD as Consulting Physician (Neurosurgery) 08/31/2021  CHIEF COMPLAINT: Follow up anemia  CURRENT THERAPY: To start B12 injections for B12 deficiency   INTERVAL HISTORY: Ms. Fukushima returns for anemia work up results and to start B12 injections. She did not start oral B12.  She feels the same, some tiredness but remains functional.  Denies bleeding, pain, cough, dyspnea, or other new specific complaints.   MEDICAL HISTORY:  Past Medical History:  Diagnosis Date   Anemia    Arthritis    Blood transfusion without reported diagnosis    Carotid artery narrowing    Coronary artery disease    Cardiac catheterization November 2013: 50% ostial LAD stenosis 50% mid stenosis. 30% disease in the left circumflex.   Diabetes mellitus, type 2 ([place])    Hyperlipidemia    Hypertension    Onychomycosis of toenail 07/31/2016   Sleep apnea       Had surgery to correct    SURGICAL HISTORY: Past Surgical History:  Procedure Laterality Date   ABDOMINAL AORTOGRAM W/LOWER EXTREMITY N/A 08/07/2021   Procedure: ABDOMINAL AORTOGRAM W/LOWER EXTREMITY;  Surgeon: Waynetta Sandy, MD;  Location: [place] [facility];  Service: Cardiovascular;  Laterality: N/A;   ABDOMINAL HYSTERECTOMY     APPLICATION OF INTRAOPERATIVE CT SCAN N/A 11/30/2020   Procedure: APPLICATION OF INTRAOPERATIVE CT SCAN;  Surgeon: Eustace Moore, MD;  Location: [place];  Service: Neurosurgery;  Laterality: N/A;   BACK SURGERY     CARDIAC  CATHETERIZATION     CATARACT EXTRACTION, BILATERAL Bilateral 2021   Dr. Sherral Hammers   ENDARTERECTOMY  09/27/2011   Procedure: RIGT ENDARTERECTOMY CAROTID;  Surgeon: Mal Misty, MD;  Location: [facility];  Service: Vascular;  Laterality: Right;   EPIDURAL BLOCK INJECTION  02/2008   Drs. Eulis Manly   gyn surgery  2004   total hysterectomy for mennorhagia,,salpingoophorectomy   LAMINECTOMY WITH POSTERIOR LATERAL ARTHRODESIS LEVEL 2 N/A 11/30/2020   Procedure: LUMBAR FOUR-FIVE, LUMBAR FIVE-SACRAL ONE POSTERIOR LATERAL FUSION WITH REVISION OF LUMBAR ONE-FIVE HARDWARE AND EXTENSION TO SACRAL ONE AND SACRAL TWO;  Surgeon: Eustace Moore, MD;  Location: [facility];  Service: Neurosurgery;  Laterality: N/A;   LUMBAR WOUND DEBRIDEMENT N/A 05/25/2020   Procedure: LUMBAR WOUND IRRIGATION AND DEBRIDEMENT;  Surgeon: Eustace Moore, MD;  Location: [place];  Service: Neurosurgery;  Laterality: N/A;   PERIPHERAL VASCULAR INTERVENTION  08/07/2021   Procedure: PERIPHERAL VASCULAR INTERVENTION;  Surgeon: Waynetta Sandy, MD;  Location: [place] [facility];  Service: Cardiovascular;;  Rt SFA   POSTERIOR LUMBAR FUSION 4 LEVEL N/A 04/25/2020   Procedure: POSTERIOR LUMBAR INTERBODY FUSION LUMBAR ONE-TWO, LUMBAR TWO-THREE, LUMBAR THREE-FOUR,LUMBAR FOUR-FIVE.;  Surgeon: Eustace Moore, MD;  Location: [facility];  Service: Neurosurgery;  Laterality: N/A;  posterior   TONSILLECTOMY     TOTAL ABDOMINAL HYSTERECTOMY W/ BILATERAL SALPINGOOPHORECTOMY Bilateral 2000   TOTAL HIP ARTHROPLASTY Right 08/22/2018   Procedure: TOTAL HIP ARTHROPLASTY;  Surgeon: Earlie Server, MD;  [facility];  Service: Orthopedics;  Laterality: Right;   VARICOSE VEIN SURGERY  2008   stripping    I have reviewed the social history and family history with the patient and they are unchanged from previous note.  ALLERGIES:  is allergic to zestoretic [lisinopril-hydrochlorothiazide], codeine, oxycodone, ultram [tramadol], and zocor  [simvastatin].  MEDICATIONS:  Current Outpatient Medications  Medication Sig Dispense Refill   allopurinol (ZYLOPRIM) 300 MG tablet Take 1 tablet (300 mg total) by mouth daily. 90 tablet 3   amLODipine (NORVASC) 10 MG tablet Take 10 mg by mouth in the morning.     aspirin EC 81 MG tablet Take 81 mg by mouth in the morning.     atorvastatin (LIPITOR) 80 MG tablet TAKE 1 TABLET BY MOUTH ONCE DAILY WITH  EVENING  MEAL (Patient taking differently: Take 80 mg by mouth in the morning.) 90 tablet 3   blood glucose meter kit and supplies KIT Check sugars twice daily 1 each 0   carvedilol (COREG CR) 20 MG 24 hr capsule Take 1 capsule (20 mg total) by mouth daily. 90 capsule 3   clopidogrel (PLAVIX) 75 MG tablet Take 75 mg by mouth in the morning.     ezetimibe (ZETIA) 10 MG tablet TAKE 1 TABLET EVERY DAY (NEED MD APPOINTMENT) 90 tablet 0   ferrous sulfate 325 (65 FE) MG tablet Take 325 mg by mouth in the morning and at bedtime.     gabapentin (NEURONTIN) 300 MG capsule Take 300 mg by mouth 3 (three) times daily.     glucose blood test strip Check sugars twice daily 100 each 11   metFORMIN (GLUCOPHAGE) 500 MG tablet Take 500 mg by mouth 2 (two) times daily with a meal.     oxyCODONE-acetaminophen (PERCOCET) 10-325 MG tablet Take 1 tablet by mouth every 4 (four) hours as needed for pain. 40 tablet 0   No current facility-administered medications for this visit.    PHYSICAL EXAMINATION: ECOG PERFORMANCE STATUS: 1 - Symptomatic but completely ambulatory  Vitals:   08/31/21 1301  BP: (!) 142/65  Pulse: 79  Temp: (!) 97.5 F (36.4 C)  SpO2: 100%   Filed Weights   08/31/21 1301  Weight: 141 lb 4.8 oz (64.1 kg)    GENERAL:alert, no distress and comfortable SKIN: No rash  EYES: sclera clear LUNGS:  normal breathing effort HEART:  no lower extremity edema Musculoskeletal: Ambulates with a cane NEURO: alert & oriented x 3 with fluent speech   LABORATORY DATA:  I have reviewed the data as  listed    Latest Ref Rng & Units 08/31/2021   12:27 PM 08/21/2021    3:06 PM 08/21/2021   12:21 PM  CBC  WBC 4.0 - 10.5 K/uL 7.8   10.7   Hemoglobin 12.0 - 15.0 g/dL 8.7   8.8   Hematocrit 36.0 - 46.0 % 28.4  28.6  28.7   Platelets 150 - 400 K/uL 330   472         Latest Ref Rng & Units 08/21/2021   12:21 PM 08/07/2021    1:16 PM 06/07/2021    9:58 AM  CMP  Glucose 70 - 99 mg/dL 114  102  147   BUN 8 - 23 mg/dL _0 Creatinine 0.44 - 1.00 mg/dL 0.77  0.40  0.68   Sodium 135 - 145 mmol/L 137  140  136   Potassium 3.5 - 5.1 mmol/L 4.6  4.6  4.0   Chloride 98 - 111 mmol/L 104  103  106   CO2  22 - 32 mmol/L 25   25   Calcium 8.9 - 10.3 mg/dL 9.0   8.9   Total Protein 6.5 - 8.1 g/dL 8.3   8.9   Total Bilirubin 0.3 - 1.2 mg/dL 0.5   0.3   Alkaline Phos 38 - 126 U/L 141   108   AST 15 - 41 U/L 13   13   ALT 0 - 44 U/L 7   9       RADIOGRAPHIC STUDIES: I have personally reviewed the radiological images as listed and agreed with the findings in the report. MM Digital Diagnostic Unilat L  Result Date: 08/30/2021 CLINICAL DATA:  Left breast calcifications on a recent screening. EXAM: DIGITAL DIAGNOSTIC UNILATERAL LEFT MAMMOGRAM WITH CAD TECHNIQUE: Left digital diagnostic mammography was performed. Mammographic images were processed with CAD. COMPARISON:  Previous exam(s). ACR Breast Density Category b: There are scattered areas of fibroglandular density. FINDINGS: There is an elongated calcifications in the upper, slightly outer left breast spanning 2.0 cm. These are coarse and dystrophic in appearance. No findings suspicious for malignancy. 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 RECOMMENDATION: Bilateral screening mammogram in year when due. I have discussed the findings and recommendations with the patient. If applicable, a reminder letter will be sent to the patient regarding the next appointment. BI-RADS CATEGORY  2:  Benign. Electronically Signed   By: Claudie Revering M.D.   On: 08/30/2021 14:06    ASSESSMENT & PLAN:  71 yo female    Anemia, unspecified type -She has had mild intermittent anemia with hgb 9-11 range since at least 2011. She had acute severe anemia with hgb 6.4 in 04/2020 while hospitalized for post-op MRSA+ wound infection after lumbar surgery, and again hgb 7.4 in 11/2020 while hospitalized for a second neurosurgery. She received 2 units pRBCs with each of these episodes.  -her anemia resolved briefly from 2020-2022, recently has been stable 9.5 -10.5 range -iron panel 08/01/21 showed serum iron 35 (low) TIBC 260 (low-normal), 13% transferrin (low), and ferritin 164 (normal); these levels indicate anemia of chronic disease rather than true iron deficiency  -she was seen by hematology 08/21/21, I reviewed her work up in detail today which showed Hgb 8.8, no other cytopenias. No evidence of hemolysis, CKD, iron or folate deficiency. MM panel and light chains are abnormal but nonspecific. There is no concern for multiple myeloma. She has low B12 and elevated MMA which confirms B12 deficiency.  I ordered intrinsic factor to rule out pernicious anemia -Ms. Corrow appears stable, fatigue at baseline, no bleeding.  -labs reviewed, Hgb 8.7 today.  -We reviewed if her anemia does not resolve with adequate B12 replacement, she likely has multifactorial anemia including anemia of chronic disease and possibly from other source.   -We have not ruled out primary bone marrow disorder, but a bone marrow biopsy is not being recommended at this time. -We will begin loading B12 injections weekly x6, then monthly -will check labs at last loading dose then periodically -f/up with [place] at the time of her second monthly injection ~14 weeks   2. Co-morbidities: HTN, HL, DM, CAD, carotid artery stenosis/occlusion, OA, multiple back surgeries and post op infection -Her anemia worsened after neurosurgery and post-op infection,  s/p blood transfusion in April and October 2022 -she likely has a component of anemia of chronic disease  -continue PCP, ortho, neurosurgery, cardiology, and vascular surgery f/up    Health maintenance  -she drinks alcohol and smokes  cigarettes, we reviewed cessation.  She has B12 deficiency, denies history of gastric surgeries or heavy alcohol use  -Up-to-date on colonoscopy  -Recent mammogram showed benign left breast calcifications  Plan: -Anemia work-up reviewed -Begin B12 injection weekly x6, lab on last dose -Then maintenance B12 monthly -lab and follow-up with second monthly dose, in ~14 weeks -Work up reviewed with Dr. Burr Medico -I will cc PCP    Orders Placed This Encounter  Procedures   CBC with Differential ([facility] Only)    Standing Status:   Standing    Number of Occurrences:   10    Standing Expiration Date:   09/01/2022   Vitamin B12    Standing Status:   Standing    Number of Occurrences:   10    Standing Expiration Date:   09/01/2022   All questions were answered. The patient knows to call the clinic with any problems, questions or concerns. No barriers to learning was detected. I spent 20 minutes counseling the patient face to face. The total time spent in the appointment was 30 minutes and more than 50% was on counseling and review of test results     Alla Feeling, NP 08/31/21

## 2021-08-31 NOTE — Telephone Encounter (Signed)
.  Called patient to schedule appointment per 7/6 inbasket, patient is aware of date and time.   

## 2021-09-01 NOTE — Progress Notes (Signed)
Faxed office notes from Santiago Glad, NP on 7/6 to PCP Dr. Francee Piccolo.  Faxed electronically through Epic.  No concerns at this time.

## 2021-09-02 LAB — INTRINSIC FACTOR ANTIBODIES: Intrinsic Factor: 1 AU/mL (ref 0.0–1.1)

## 2021-09-06 ENCOUNTER — Other Ambulatory Visit: Payer: Self-pay

## 2021-09-06 ENCOUNTER — Inpatient Hospital Stay: Payer: Medicare HMO

## 2021-09-06 DIAGNOSIS — E538 Deficiency of other specified B group vitamins: Secondary | ICD-10-CM

## 2021-09-06 MED ORDER — CYANOCOBALAMIN 1000 MCG/ML IJ SOLN
1000.0000 ug | Freq: Once | INTRAMUSCULAR | Status: AC
  Administered 2021-09-06: 1000 ug via INTRAMUSCULAR
  Filled 2021-09-06: qty 1

## 2021-09-12 ENCOUNTER — Ambulatory Visit: Payer: Medicare HMO | Admitting: Podiatry

## 2021-09-13 ENCOUNTER — Inpatient Hospital Stay: Payer: Medicare HMO

## 2021-09-15 ENCOUNTER — Other Ambulatory Visit: Payer: Self-pay

## 2021-09-15 ENCOUNTER — Inpatient Hospital Stay: Payer: Medicare HMO

## 2021-09-15 DIAGNOSIS — E538 Deficiency of other specified B group vitamins: Secondary | ICD-10-CM

## 2021-09-15 MED ORDER — CYANOCOBALAMIN 1000 MCG/ML IJ SOLN
1000.0000 ug | Freq: Once | INTRAMUSCULAR | Status: AC
  Administered 2021-09-15: 1000 ug via INTRAMUSCULAR
  Filled 2021-09-15: qty 1

## 2021-09-19 NOTE — Progress Notes (Unsigned)
HISTORY AND PHYSICAL     CC:  follow up. Requesting Provider:  Sonia Side., FNP  HPI: This is a 71 y.o. female who is here today for follow up for PAD.  Pt has hx of coronary artery disease with diabetes, hyperlipidemia and hypertension.   She has hx of right CEA, which occluded and a known left carotid bruit that is followed.    Pt was last seen 08/02/2021 and at that time, she was having cramping with walking in her calf and foot at night.  She did not have any tissue loss. She had a diminished toe pressure and was scheduled for angiogram, which was on 08/07/2021 at which time she underwent  Stent of right SFA  by Dr. Donzetta Matters.  Dr. Donzetta Matters noted that if this stent fails, she would need a right CFA to BK popliteal artery bypass.    The pt returns today for follow up.  She states overall she is doing well.  She has some numbness in her feet that was present prior to intervention.  She is diabetic and takes gabapentin.  She does not have rest pain or ulcers.  She is a smoker but hasn't smoked in a couple of days.  The pt is on a statin for cholesterol management.    The pt is on an aspirin.    Other AC:  Plavix The pt is on CCB for hypertension.  The pt does  have diabetes. Tobacco hx:  current    Past Medical History:  Diagnosis Date   Anemia    Arthritis    Blood transfusion without reported diagnosis    Carotid artery narrowing    Coronary artery disease    Cardiac catheterization November 2013: 50% ostial LAD stenosis 50% mid stenosis. 30% disease in the left circumflex.   Diabetes mellitus, type 2 ([place])    Hyperlipidemia    Hypertension    Onychomycosis of toenail 07/31/2016   Sleep apnea       Had surgery to correct    Past Surgical History:  Procedure Laterality Date   ABDOMINAL AORTOGRAM W/LOWER EXTREMITY N/A 08/07/2021   Procedure: ABDOMINAL AORTOGRAM W/LOWER EXTREMITY;  Surgeon: Waynetta Sandy, MD;  Location: [facility];  Service: Cardiovascular;   Laterality: N/A;   ABDOMINAL HYSTERECTOMY     APPLICATION OF INTRAOPERATIVE CT SCAN N/A 11/30/2020   Procedure: APPLICATION OF INTRAOPERATIVE CT SCAN;  Surgeon: Eustace Moore, MD;  Location: [facility];  Service: Neurosurgery;  Laterality: N/A;   BACK SURGERY     CARDIAC CATHETERIZATION     CATARACT EXTRACTION, BILATERAL Bilateral 2021   Dr. Sherral Hammers   ENDARTERECTOMY  09/27/2011   Procedure: RIGT ENDARTERECTOMY CAROTID;  Surgeon: Mal Misty, MD;  Location: [facility];  Service: Vascular;  Laterality: Right;   EPIDURAL BLOCK INJECTION  02/2008   Drs. Eulis Manly   gyn surgery  2004   total hysterectomy for mennorhagia,,salpingoophorectomy   LAMINECTOMY WITH POSTERIOR LATERAL ARTHRODESIS LEVEL 2 N/A 11/30/2020   Procedure: LUMBAR FOUR-FIVE, LUMBAR FIVE-SACRAL ONE POSTERIOR LATERAL FUSION WITH REVISION OF LUMBAR ONE-FIVE HARDWARE AND EXTENSION TO SACRAL ONE AND SACRAL TWO;  Surgeon: Eustace Moore, MD;  Location: [facility];  Service: Neurosurgery;  Laterality: N/A;   LUMBAR WOUND DEBRIDEMENT N/A 05/25/2020   Procedure: LUMBAR WOUND IRRIGATION AND DEBRIDEMENT;  Surgeon: Eustace Moore, MD;  Location: [facility];  Service: Neurosurgery;  Laterality: N/A;   PERIPHERAL VASCULAR INTERVENTION  08/07/2021   Procedure: PERIPHERAL VASCULAR INTERVENTION;  Surgeon: Waynetta Sandy, MD;  Location: [facility];  Service: Cardiovascular;;  Rt SFA   POSTERIOR LUMBAR FUSION 4 LEVEL N/A 04/25/2020   Procedure: POSTERIOR LUMBAR INTERBODY FUSION LUMBAR ONE-TWO, LUMBAR TWO-THREE, LUMBAR THREE-FOUR,LUMBAR FOUR-FIVE.;  Surgeon: Eustace Moore, MD;  Location: [facility];  Service: Neurosurgery;  Laterality: N/A;  posterior   TONSILLECTOMY     TOTAL ABDOMINAL HYSTERECTOMY W/ BILATERAL SALPINGOOPHORECTOMY Bilateral 2000   TOTAL HIP ARTHROPLASTY Right 08/22/2018   Procedure: TOTAL HIP ARTHROPLASTY;  Surgeon: Earlie Server, MD;  Location: [facility];  Service: Orthopedics;  Laterality: Right;   VARICOSE VEIN SURGERY   2008   stripping    Allergies  Allergen Reactions   Zestoretic [Lisinopril-Hydrochlorothiazide] Swelling    Angioedema  - Tongue swelling    Codeine Itching   Oxycodone Itching   Ultram [Tramadol] Itching   Zocor [Simvastatin] Other (See Comments)    Muscle pain    Current Outpatient Medications  Medication Sig Dispense Refill   allopurinol (ZYLOPRIM) 300 MG tablet Take 1 tablet (300 mg total) by mouth daily. 90 tablet 3   amLODipine (NORVASC) 10 MG tablet Take 10 mg by mouth in the morning.     aspirin EC 81 MG tablet Take 81 mg by mouth in the morning.     atorvastatin (LIPITOR) 80 MG tablet TAKE 1 TABLET BY MOUTH ONCE DAILY WITH  EVENING  MEAL (Patient taking differently: Take 80 mg by mouth in the morning.) 90 tablet 3   blood glucose meter kit and supplies KIT Check sugars twice daily 1 each 0   carvedilol (COREG CR) 20 MG 24 hr capsule Take 1 capsule (20 mg total) by mouth daily. 90 capsule 3   clopidogrel (PLAVIX) 75 MG tablet Take 75 mg by mouth in the morning.     ezetimibe (ZETIA) 10 MG tablet TAKE 1 TABLET EVERY DAY (NEED MD APPOINTMENT) 90 tablet 0   ferrous sulfate 325 (65 FE) MG tablet Take 325 mg by mouth in the morning and at bedtime.     gabapentin (NEURONTIN) 300 MG capsule Take 300 mg by mouth 3 (three) times daily.     glucose blood test strip Check sugars twice daily 100 each 11   metFORMIN (GLUCOPHAGE) 500 MG tablet Take 500 mg by mouth 2 (two) times daily with a meal.     oxyCODONE-acetaminophen (PERCOCET) 10-325 MG tablet Take 1 tablet by mouth every 4 (four) hours as needed for pain. 40 tablet 0   No current facility-administered medications for this visit.    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    Comment: [employer]-retired 07/23/2014  Tobacco Use   Smoking status: Every Day    Packs/day: 0.10    Years: 50.00    Total pack years: 5.00    Types: Cigarettes  Start date: 05/31/1968   Smokeless tobacco: Never   Tobacco comments:    Smoked x50 years, 1 pack would last 3 days. Currently smoking 4 cigarettes per day  Vaping Use   Vaping Use: Never used  Substance and Sexual Activity   Alcohol use: Yes    Comment: 1-2 drinks every other day   Drug use: No   Sexual activity: Not on file  Other Topics Concern   Not on file  Social History Narrative   Lives with long term boyfriend (30 years together)   Both sons live in [place] Strain: Low Risk  (08/06/2018)   Overall Financial Resource Strain (CARDIA)    Difficulty of Paying Living Expenses: Not hard at all  Food Insecurity: No Food Insecurity (08/07/2019)   Hunger Vital Sign    Worried About Running Out of Food in the Last Year: Never true    Ran Out of Food in the Last Year: Never true  Transportation Needs: No Transportation Needs (08/07/2019)   PRAPARE - Hydrologist (Medical): No    Lack of Transportation (Non-Medical): No  Physical Activity: Inactive (06/17/2017)   Exercise Vital Sign    Days of Exercise per Week: 0 days    Minutes of Exercise per Session: 0 min  Stress: No Stress Concern Present (06/17/2017)   [place]    Feeling of Stress : Not at all  Social Connections: Unknown (08/06/2018)   Social Connection and Isolation Panel [NHANES]    Frequency of Communication with Friends  and Family: Not on file    Frequency of Social Gatherings with Friends and Family: Not on file    Attends Religious Services: Not on file    Active Member of Clubs or Organizations: Not on file    Attends Archivist Meetings: Not on file    Marital Status: Living with partner  Intimate Partner Violence: Not At Risk (08/07/2019)   Humiliation, Afraid, Rape, and Kick questionnaire    Fear of Current or Ex-Partner: No    Emotionally Abused: No    Physically Abused: No    Sexually Abused: No     REVIEW OF SYSTEMS:   _0  denotes positive finding, _1  denotes negative finding Cardiac  Comments:  Chest pain or chest pressure:    Shortness of breath upon exertion:    Short of breath when lying flat:    Irregular heart rhythm:        Vascular    Pain in calf, thigh, or hip brought on by ambulation:    Pain in feet at night that wakes you up from your sleep:     Blood clot in your veins:    Leg swelling:         Pulmonary    Oxygen at home:    Productive cough:     Wheezing:         Neurologic    Sudden weakness in arms or legs:     Sudden numbness in arms or legs:     Sudden onset of difficulty speaking or slurred speech:    Temporary loss of vision in one eye:     Problems with dizziness:         Gastrointestinal    Blood in stool:     Vomited blood:         Genitourinary    Burning  when urinating:     Blood in urine:        Psychiatric    Major depression:         Hematologic    Bleeding problems:    Problems with blood clotting too easily:        Skin    Rashes or ulcers:        Constitutional    Fever or chills:      PHYSICAL EXAMINATION:  Today's Vitals   09/20/21 1131  BP: (!) 143/64  Pulse: (!) 54  Resp: 20  Temp: 98.6 F (37 C)  TempSrc: Temporal  SpO2: 100%  Weight: 141 lb 3.2 oz (64 kg)  Height: _0  (1.6 m)   Body mass index is 25.01 kg/m.   General:  WDWN in NAD; vital signs documented above Gait: Not observed HENT: WNL,  normocephalic Pulmonary: normal non-labored breathing , without wheezing Cardiac: regular HR, with carotid bruit on the left Abdomen: soft, NT, no masses; aortic pulse is not palpable Skin: without rashes Vascular Exam/Pulses:  Right Left  Radial 2+ (normal) 2+ (normal)  Femoral 2+ (normal) 2+ (normal)  Popliteal Unable to palpate Unable to palpate  DP Unable to palpate 2+ (normal)  PT Unable to palpate Unable to palpate   Extremities: without ischemic changes, without Gangrene , without cellulitis; without open wounds Musculoskeletal: no muscle wasting or atrophy  Neurologic: A&O X 3 Psychiatric:  The pt has Normal affect.   Non-Invasive Vascular Imaging:   ABI's/TBI's on 09/20/2021: Right:  0.85/0.49 - Great toe pressure: 68 Left:  1.07/0.54 - Great toe pressure: 74  Arterial duplex on 09/20/2021: +----------+--------+-----+---------------+----------+--------+  RIGHT     PSV cm/sRatioStenosis       Waveform  Comments  +----------+--------+-----+---------------+----------+--------+  CFA Distal114                         triphasic           +----------+--------+-----+---------------+----------+--------+  DFA       94                          triphasic           +----------+--------+-----+---------------+----------+--------+  SFA Prox  609          75-99% stenosismonophasic          +----------+--------+-----+---------------+----------+--------+  SFA Mid   94                          monophasic          +----------+--------+-----+---------------+----------+--------+  SFA Distal                                      stent     +----------+--------+-----+---------------+----------+--------+  POP Prox  76                          biphasic            +----------+--------+-----+---------------+----------+--------+     Right Stent(s):  +---------------+--------+--------+----------+--------+  SFA            PSV cm/sStenosisWaveform   Comments  +---------------+--------+--------+----------+--------+  Prox to Stent  85              monophasic          +---------------+--------+--------+----------+--------+  Proximal Stent 66              monophasic          +---------------+--------+--------+----------+--------+  Mid Stent      77              monophasic          +---------------+--------+--------+----------+--------+  Distal Stent   44              monophasic          +---------------+--------+--------+----------+--------+  Distal to Stent66              monophasic          +---------------+--------+--------+----------+--------+   Summary:  Right: Elevated velocities suggestive of a 75-99% stenosis in the proximal superficial femoral artery. Patent stent with no evidence of stenosis in the superficial femoral artery.   Previous ABI's/TBI's on 08/02/2021: Right:  0.68/0.18 - Great toe pressure: 31 Left:  0.96/0.54 - Great toe pressure:  92  Previous arterial duplex on 08/02/2021: +----------+--------+-----+--------+----------+--------+  RIGHT     PSV cm/sRatioStenosisWaveform  Comments  +----------+--------+-----+--------+----------+--------+  CFA Distal121                  triphasic           +----------+--------+-----+--------+----------+--------+  DFA       74                   biphasic            +----------+--------+-----+--------+----------+--------+  SFA Prox  75                   biphasic            +----------+--------+-----+--------+----------+--------+  SFA Mid   80                   monophasic          +----------+--------+-----+--------+----------+--------+  SFA Distal0            occluded                    +----------+--------+-----+--------+----------+--------+  POP Prox  36                   biphasic            +----------+--------+-----+--------+----------+--------+  POP Distal53                   biphasic             +----------+--------+-----+--------+----------+--------+  ATA Distal29                   monophasic          +----------+--------+-----+--------+----------+--------+  PTA Distal34                   monophasic          +----------+--------+-----+--------+----------+--------+   ASSESSMENT/PLAN:: 71 y.o. female here for follow up for PAD with hx of angiogram, with left SFA stent on 08/07/2021  by Dr. Donzetta Matters.  PAD -pt ABI improved, however, she has a significant increased velocity in the SFA proximal to the stent of 609cm/s.  The stent that was placed is patent without stenosis.  I discussed with Dr. Scot Dock and will schedule pt for angiogram with Dr. Donzetta Matters to evaluate stenosis.  Pt knows to call sooner if she has issues before then. -continue asa/statin/plavix  Carotid stenosis -last duplex in November revealed  occluded right ICA and 60-79% left ICA stenosis.  -she remains asymptomatic.  She will need carotid duplex when she returns after her angiogram when she has her RLE arterial duplex and ABI 6 weeks after angiogram -she knows to call 911 if she develops any temporary vision loss, speech difficulties or unilateral weakness, numbness or paralysis.  Current smoker -discussed importance of smoking cessation.  She has not smoked for a couple of days.  I congratulated her for that and encouraged her to continue this.     Leontine Locket, [facility] [phone]  Clinic MD:   Scot Dock

## 2021-09-20 ENCOUNTER — Ambulatory Visit (INDEPENDENT_AMBULATORY_CARE_PROVIDER_SITE_OTHER)
Admission: RE | Admit: 2021-09-20 | Discharge: 2021-09-20 | Disposition: A | Discharge status: Home / Self Care | Payer: Medicare HMO | Source: Ambulatory Visit | Attending: Vascular Surgery | Admitting: Vascular Surgery

## 2021-09-20 ENCOUNTER — Ambulatory Visit (INDEPENDENT_AMBULATORY_CARE_PROVIDER_SITE_OTHER): Payer: Medicare HMO | Admitting: Physician Assistant

## 2021-09-20 ENCOUNTER — Ambulatory Visit (HOSPITAL_COMMUNITY)
Admission: RE | Admit: 2021-09-20 | Discharge: 2021-09-20 | Disposition: A | Discharge status: Home / Self Care | Payer: Medicare HMO | Source: Ambulatory Visit | Attending: Vascular Surgery | Admitting: Vascular Surgery

## 2021-09-20 VITALS — BP 143/64 | HR 54 | Temp 98.6°F | Resp 20 | Ht 63.0 in | Wt 141.2 lb

## 2021-09-20 DIAGNOSIS — I739 Peripheral vascular disease, unspecified: Secondary | ICD-10-CM

## 2021-09-21 ENCOUNTER — Inpatient Hospital Stay: Payer: Medicare HMO

## 2021-09-21 DIAGNOSIS — E538 Deficiency of other specified B group vitamins: Secondary | ICD-10-CM | POA: Diagnosis not present

## 2021-09-21 MED ORDER — CYANOCOBALAMIN 1000 MCG/ML IJ SOLN
1000.0000 ug | Freq: Once | INTRAMUSCULAR | Status: AC
  Administered 2021-09-21: 1000 ug via INTRAMUSCULAR
  Filled 2021-09-21: qty 1

## 2021-09-21 NOTE — Patient Instructions (Signed)
Vitamin B12 Deficiency Vitamin B12 deficiency means that your body does not have enough vitamin B12. The body needs this important vitamin: To make red blood cells. To make genes (DNA). To help the nerves work. If you do not have enough vitamin B12 in your body, you can have health problems, such as not having enough red blood cells in the blood (anemia). What are the causes? Not eating enough foods that contain vitamin B12. Not being able to take in (absorb) vitamin B12 from the food that you eat. Certain diseases. A condition in which the body does not make enough of a certain protein. This results in your body not taking in enough vitamin B12. Having a surgery in which part of the stomach or small intestine is taken out. Taking medicines that make it hard for the body to take in vitamin B12. These include: Heartburn medicines. Some medicines that are used to treat diabetes. What increases the risk? Being an older adult. Eating a vegetarian or vegan diet that does not include any foods that come from animals. Not eating enough foods that contain vitamin B12 while you are pregnant. Taking certain medicines. Having alcoholism. What are the signs or symptoms? In some cases, there are no symptoms. If the condition leads to too few blood cells or nerve damage, symptoms can occur, such as: Feeling weak or tired. Not being hungry. Losing feeling (numbness) or tingling in your hands and feet. Redness and burning of the tongue. Feeling sad (depressed). Confusion or memory problems. Trouble walking. If anemia is very bad, symptoms can include: Being short of breath. Being dizzy. Having a very fast heartbeat. How is this treated? Changing the way you eat and drink, such as: Eating more foods that contain vitamin B12. Drinking little or no alcohol. Getting vitamin B12 shots. Taking vitamin B12 supplements by mouth (orally). Your doctor will tell you the dose that is best for you. Follow  these instructions at home: Eating and drinking  Eat foods that come from animals and have a lot of vitamin B12 in them. These include: Meats and poultry. This includes beef, pork, chicken, turkey, and organ meats, such as liver. Seafood, such as clams, rainbow trout, salmon, tuna, and haddock. Eggs. Dairy foods such as milk, yogurt, and cheese. Eat breakfast cereals that have vitamin B12 added to them (are fortified). Check the label. The items listed above may not be a complete list of foods and beverages you can eat and drink. Contact a dietitian for more information. Alcohol use Do not drink alcohol if: Your doctor tells you not to drink. You are pregnant, may be pregnant, or are planning to become pregnant. If you drink alcohol: Limit how much you have to: 0-1 drink a day for women. 0-2 drinks a day for men. Know how much alcohol is in your drink. In the [place] one drink equals one 12 oz bottle of beer (355 mL), one 5 oz glass of wine (148 mL), or one 1 oz glass of hard liquor (44 mL). General instructions Get any vitamin B12 shots if told by your doctor. Take supplements only as told by your doctor. Follow the directions. Keep all follow-up visits. Contact a doctor if: Your symptoms come back. Your symptoms get worse or do not get better with treatment. Get help right away if: You have trouble breathing. You have a very fast heartbeat. You have chest pain. You get dizzy. You faint. These symptoms may be an emergency. Get help right away. Call 911.   Do not wait to see if the symptoms will go away. Do not drive yourself to the hospital. Summary Vitamin B12 deficiency means that your body is not getting enough of the vitamin. In some cases, there are no symptoms of this condition. Treatment may include making a change in the way you eat and drink, getting shots, or taking supplements. Eat foods that have vitamin B12 in them. This information is not intended to replace advice  given to you by your health care provider. Make sure you discuss any questions you have with your health care provider. Document Revised: 10/07/2020 Document Reviewed: 10/07/2020 Elsevier Patient Education  2023 [employer]vier Inc.  

## 2021-09-26 ENCOUNTER — Other Ambulatory Visit: Payer: Self-pay | Admitting: *Deleted

## 2021-09-26 DIAGNOSIS — I739 Peripheral vascular disease, unspecified: Secondary | ICD-10-CM

## 2021-09-27 ENCOUNTER — Other Ambulatory Visit: Payer: Self-pay

## 2021-09-27 ENCOUNTER — Inpatient Hospital Stay: Payer: Medicare HMO | Attending: Nurse Practitioner

## 2021-09-27 DIAGNOSIS — E538 Deficiency of other specified B group vitamins: Secondary | ICD-10-CM | POA: Diagnosis present

## 2021-09-27 DIAGNOSIS — D649 Anemia, unspecified: Secondary | ICD-10-CM | POA: Diagnosis present

## 2021-09-27 MED ORDER — CYANOCOBALAMIN 1000 MCG/ML IJ SOLN
1000.0000 ug | Freq: Once | INTRAMUSCULAR | Status: AC
  Administered 2021-09-27: 1000 ug via INTRAMUSCULAR
  Filled 2021-09-27: qty 1

## 2021-10-04 ENCOUNTER — Inpatient Hospital Stay: Payer: Medicare HMO

## 2021-10-04 ENCOUNTER — Other Ambulatory Visit: Payer: Self-pay

## 2021-10-04 VITALS — BP 147/58 | HR 92 | Resp 20

## 2021-10-04 DIAGNOSIS — E538 Deficiency of other specified B group vitamins: Secondary | ICD-10-CM | POA: Diagnosis not present

## 2021-10-04 MED ORDER — CYANOCOBALAMIN 1000 MCG/ML IJ SOLN
1000.0000 ug | Freq: Once | INTRAMUSCULAR | Status: AC
  Administered 2021-10-04: 1000 ug via INTRAMUSCULAR
  Filled 2021-10-04: qty 1

## 2021-10-10 ENCOUNTER — Ambulatory Visit (INDEPENDENT_AMBULATORY_CARE_PROVIDER_SITE_OTHER): Payer: Medicare HMO | Admitting: Podiatry

## 2021-10-10 DIAGNOSIS — M79675 Pain in left toe(s): Secondary | ICD-10-CM | POA: Diagnosis not present

## 2021-10-10 DIAGNOSIS — E1142 Type 2 diabetes mellitus with diabetic polyneuropathy: Secondary | ICD-10-CM

## 2021-10-10 DIAGNOSIS — B351 Tinea unguium: Secondary | ICD-10-CM

## 2021-10-10 DIAGNOSIS — M79674 Pain in right toe(s): Secondary | ICD-10-CM

## 2021-10-10 NOTE — Progress Notes (Signed)
She presents today chief complaint of painful elongated toenails 1 through 5 bilateral.  Objective: Pulses are nonpalpable she is seeing Dr. Pascal Lux at the moment and having angioplasty and a stenting.  She states that she goes back to see him next month.  Nails are long thick yellow dystrophic onychomycotic no open lesions or wounds.  No areas of cyanosis feet are warm to the touch.  Assessment: Peripheral vascular disease painful elongated toenails.  Plan: Debridement of toenails 1 through 5 bilateral.

## 2021-10-11 ENCOUNTER — Inpatient Hospital Stay: Payer: Medicare HMO

## 2021-10-11 ENCOUNTER — Other Ambulatory Visit: Payer: Self-pay

## 2021-10-11 DIAGNOSIS — E538 Deficiency of other specified B group vitamins: Secondary | ICD-10-CM | POA: Diagnosis not present

## 2021-10-11 DIAGNOSIS — D649 Anemia, unspecified: Secondary | ICD-10-CM

## 2021-10-11 LAB — CBC WITH DIFFERENTIAL (CANCER CENTER ONLY)
Abs Immature Granulocytes: 0.05 10*3/uL (ref 0.00–0.07)
Basophils Absolute: 0 10*3/uL (ref 0.0–0.1)
Basophils Relative: 0 %
Eosinophils Absolute: 0.4 10*3/uL (ref 0.0–0.5)
Eosinophils Relative: 4 %
HCT: 29.9 % — ABNORMAL LOW (ref 36.0–46.0)
Hemoglobin: 9.3 g/dL — ABNORMAL LOW (ref 12.0–15.0)
Immature Granulocytes: 1 %
Lymphocytes Relative: 22 %
Lymphs Abs: 2.2 10*3/uL (ref 0.7–4.0)
MCH: 23.4 pg — ABNORMAL LOW (ref 26.0–34.0)
MCHC: 31.1 g/dL (ref 30.0–36.0)
MCV: 75.3 fL — ABNORMAL LOW (ref 80.0–100.0)
Monocytes Absolute: 0.7 10*3/uL (ref 0.1–1.0)
Monocytes Relative: 7 %
Neutro Abs: 6.4 10*3/uL (ref 1.7–7.7)
Neutrophils Relative %: 66 %
Platelet Count: 451 10*3/uL — ABNORMAL HIGH (ref 150–400)
RBC: 3.97 MIL/uL (ref 3.87–5.11)
RDW: 17.9 % — ABNORMAL HIGH (ref 11.5–15.5)
WBC Count: 9.7 10*3/uL (ref 4.0–10.5)
nRBC: 0 % (ref 0.0–0.2)

## 2021-10-11 LAB — VITAMIN B12: Vitamin B-12: 1362 pg/mL — ABNORMAL HIGH (ref 180–914)

## 2021-10-11 MED ORDER — CYANOCOBALAMIN 1000 MCG/ML IJ SOLN
1000.0000 ug | Freq: Once | INTRAMUSCULAR | Status: AC
  Administered 2021-10-11: 1000 ug via INTRAMUSCULAR
  Filled 2021-10-11: qty 1

## 2021-10-11 NOTE — Patient Instructions (Signed)
Vitamin B12 Deficiency Vitamin B12 deficiency means that your body does not have enough vitamin B12. The body needs this important vitamin: To make red blood cells. To make genes (DNA). To help the nerves work. If you do not have enough vitamin B12 in your body, you can have health problems, such as not having enough red blood cells in the blood (anemia). What are the causes? Not eating enough foods that contain vitamin B12. Not being able to take in (absorb) vitamin B12 from the food that you eat. Certain diseases. A condition in which the body does not make enough of a certain protein. This results in your body not taking in enough vitamin B12. Having a surgery in which part of the stomach or small intestine is taken out. Taking medicines that make it hard for the body to take in vitamin B12. These include: Heartburn medicines. Some medicines that are used to treat diabetes. What increases the risk? Being an older adult. Eating a vegetarian or vegan diet that does not include any foods that come from animals. Not eating enough foods that contain vitamin B12 while you are pregnant. Taking certain medicines. Having alcoholism. What are the signs or symptoms? In some cases, there are no symptoms. If the condition leads to too few blood cells or nerve damage, symptoms can occur, such as: Feeling weak or tired. Not being hungry. Losing feeling (numbness) or tingling in your hands and feet. Redness and burning of the tongue. Feeling sad (depressed). Confusion or memory problems. Trouble walking. If anemia is very bad, symptoms can include: Being short of breath. Being dizzy. Having a very fast heartbeat. How is this treated? Changing the way you eat and drink, such as: Eating more foods that contain vitamin B12. Drinking little or no alcohol. Getting vitamin B12 shots. Taking vitamin B12 supplements by mouth (orally). Your doctor will tell you the dose that is best for you. Follow  these instructions at home: Eating and drinking  Eat foods that come from animals and have a lot of vitamin B12 in them. These include: Meats and poultry. This includes beef, pork, chicken, turkey, and organ meats, such as liver. Seafood, such as clams, rainbow trout, salmon, tuna, and haddock. Eggs. Dairy foods such as milk, yogurt, and cheese. Eat breakfast cereals that have vitamin B12 added to them (are fortified). Check the label. The items listed above may not be a complete list of foods and beverages you can eat and drink. Contact a dietitian for more information. Alcohol use Do not drink alcohol if: Your doctor tells you not to drink. You are pregnant, may be pregnant, or are planning to become pregnant. If you drink alcohol: Limit how much you have to: 0-1 drink a day for women. 0-2 drinks a day for men. Know how much alcohol is in your drink. In the [place] one drink equals one 12 oz bottle of beer (355 mL), one 5 oz glass of wine (148 mL), or one 1 oz glass of hard liquor (44 mL). General instructions Get any vitamin B12 shots if told by your doctor. Take supplements only as told by your doctor. Follow the directions. Keep all follow-up visits. Contact a doctor if: Your symptoms come back. Your symptoms get worse or do not get better with treatment. Get help right away if: You have trouble breathing. You have a very fast heartbeat. You have chest pain. You get dizzy. You faint. These symptoms may be an emergency. Get help right away. Call 911.   Do not wait to see if the symptoms will go away. Do not drive yourself to the hospital. Summary Vitamin B12 deficiency means that your body is not getting enough of the vitamin. In some cases, there are no symptoms of this condition. Treatment may include making a change in the way you eat and drink, getting shots, or taking supplements. Eat foods that have vitamin B12 in them. This information is not intended to replace advice  given to you by your health care provider. Make sure you discuss any questions you have with your health care provider. Document Revised: 10/07/2020 Document Reviewed: 10/07/2020 Elsevier Patient Education  2023 [employer]vier Inc.  

## 2021-10-23 ENCOUNTER — Ambulatory Visit (HOSPITAL_COMMUNITY)
Admission: RE | Admit: 2021-10-23 | Discharge: 2021-10-23 | Disposition: A | Discharge status: Home / Self Care | Payer: Medicare HMO | Attending: Vascular Surgery | Admitting: Vascular Surgery

## 2021-10-23 ENCOUNTER — Encounter (HOSPITAL_COMMUNITY): Payer: Self-pay | Admitting: Vascular Surgery

## 2021-10-23 ENCOUNTER — Telehealth: Payer: Self-pay | Admitting: Vascular Surgery

## 2021-10-23 ENCOUNTER — Other Ambulatory Visit: Payer: Self-pay

## 2021-10-23 ENCOUNTER — Encounter (HOSPITAL_COMMUNITY)
Admission: RE | Disposition: A | Discharge status: Home / Self Care | Payer: Self-pay | Source: Home / Self Care | Attending: Vascular Surgery

## 2021-10-23 DIAGNOSIS — F17210 Nicotine dependence, cigarettes, uncomplicated: Secondary | ICD-10-CM | POA: Insufficient documentation

## 2021-10-23 DIAGNOSIS — Z79899 Other long term (current) drug therapy: Secondary | ICD-10-CM | POA: Insufficient documentation

## 2021-10-23 DIAGNOSIS — E1151 Type 2 diabetes mellitus with diabetic peripheral angiopathy without gangrene: Secondary | ICD-10-CM | POA: Insufficient documentation

## 2021-10-23 DIAGNOSIS — E785 Hyperlipidemia, unspecified: Secondary | ICD-10-CM | POA: Insufficient documentation

## 2021-10-23 DIAGNOSIS — I10 Essential (primary) hypertension: Secondary | ICD-10-CM | POA: Diagnosis not present

## 2021-10-23 DIAGNOSIS — I6523 Occlusion and stenosis of bilateral carotid arteries: Secondary | ICD-10-CM | POA: Insufficient documentation

## 2021-10-23 DIAGNOSIS — I70221 Atherosclerosis of native arteries of extremities with rest pain, right leg: Secondary | ICD-10-CM | POA: Diagnosis not present

## 2021-10-23 DIAGNOSIS — I2510 Atherosclerotic heart disease of native coronary artery without angina pectoris: Secondary | ICD-10-CM | POA: Diagnosis not present

## 2021-10-23 DIAGNOSIS — Z7984 Long term (current) use of oral hypoglycemic drugs: Secondary | ICD-10-CM | POA: Insufficient documentation

## 2021-10-23 DIAGNOSIS — Z7982 Long term (current) use of aspirin: Secondary | ICD-10-CM | POA: Diagnosis not present

## 2021-10-23 DIAGNOSIS — I739 Peripheral vascular disease, unspecified: Secondary | ICD-10-CM

## 2021-10-23 DIAGNOSIS — Z7902 Long term (current) use of antithrombotics/antiplatelets: Secondary | ICD-10-CM | POA: Insufficient documentation

## 2021-10-23 HISTORY — PX: ABDOMINAL AORTOGRAM W/LOWER EXTREMITY: CATH118223

## 2021-10-23 HISTORY — PX: PERIPHERAL VASCULAR INTERVENTION: CATH118257

## 2021-10-23 LAB — POCT I-STAT, CHEM 8
BUN: 13 mg/dL (ref 8–23)
Calcium, Ion: 1.25 mmol/L (ref 1.15–1.40)
Chloride: 102 mmol/L (ref 98–111)
Creatinine, Ser: 0.9 mg/dL (ref 0.44–1.00)
Glucose, Bld: 106 mg/dL — ABNORMAL HIGH (ref 70–99)
HCT: 30 % — ABNORMAL LOW (ref 36.0–46.0)
Hemoglobin: 10.2 g/dL — ABNORMAL LOW (ref 12.0–15.0)
Potassium: 3.7 mmol/L (ref 3.5–5.1)
Sodium: 139 mmol/L (ref 135–145)
TCO2: 24 mmol/L (ref 22–32)

## 2021-10-23 LAB — GLUCOSE, CAPILLARY: Glucose-Capillary: 87 mg/dL (ref 70–99)

## 2021-10-23 SURGERY — ABDOMINAL AORTOGRAM W/LOWER EXTREMITY
Anesthesia: LOCAL | Laterality: Right

## 2021-10-23 MED ORDER — LIDOCAINE HCL (PF) 1 % IJ SOLN
INTRAMUSCULAR | Status: DC | PRN
  Administered 2021-10-23: 15 mL

## 2021-10-23 MED ORDER — SODIUM CHLORIDE 0.9% FLUSH: 3.0000 mL | INTRAVENOUS | Status: DC | PRN

## 2021-10-23 MED ORDER — MIDAZOLAM HCL 2 MG/2ML IJ SOLN
INTRAMUSCULAR | Status: DC | PRN
  Administered 2021-10-23: 1 mg via INTRAVENOUS

## 2021-10-23 MED ORDER — ONDANSETRON HCL 4 MG/2ML IJ SOLN: 4.0000 mg | Freq: Four times a day (QID) | INTRAMUSCULAR | Status: DC | PRN

## 2021-10-23 MED ORDER — IODIXANOL 320 MG/ML IV SOLN
INTRAVENOUS | Status: DC | PRN
  Administered 2021-10-23: 70 mL

## 2021-10-23 MED ORDER — FENTANYL CITRATE (PF) 100 MCG/2ML IJ SOLN
INTRAMUSCULAR | Status: DC | PRN
Start: 2021-10-23 — End: 2021-10-23
  Administered 2021-10-23: 50 ug via INTRAVENOUS

## 2021-10-23 MED ORDER — LIDOCAINE HCL (PF) 1 % IJ SOLN
INTRAMUSCULAR | Status: AC
  Filled 2021-10-23: qty 30

## 2021-10-23 MED ORDER — SODIUM CHLORIDE 0.9% FLUSH: 3.0000 mL | Freq: Two times a day (BID) | INTRAVENOUS | Status: DC

## 2021-10-23 MED ORDER — ACETAMINOPHEN 325 MG PO TABS
650.0000 mg | ORAL_TABLET | ORAL | Status: DC | PRN
Start: 2021-10-23 — End: 2021-10-23

## 2021-10-23 MED ORDER — HEPARIN (PORCINE) IN NACL 1000-0.9 UT/500ML-% IV SOLN
INTRAVENOUS | Status: AC
  Filled 2021-10-23: qty 1000

## 2021-10-23 MED ORDER — HEPARIN SODIUM (PORCINE) 1000 UNIT/ML IJ SOLN
INTRAMUSCULAR | Status: DC | PRN
  Administered 2021-10-23: 5000 [IU] via INTRAVENOUS

## 2021-10-23 MED ORDER — SODIUM CHLORIDE 0.9 % WEIGHT BASED INFUSION
1.0000 mL/kg/h | INTRAVENOUS | Status: DC
Start: 2021-10-23 — End: 2021-10-23

## 2021-10-23 MED ORDER — SODIUM CHLORIDE 0.9 % NICU IV INFUSION SIMPLE
500.0000 mL | INJECTION | Freq: Once | INTRAVENOUS | Status: AC
  Administered 2021-10-23: 500 mL via INTRAVENOUS

## 2021-10-23 MED ORDER — SODIUM CHLORIDE 0.9 % IV SOLN: INTRAVENOUS | Status: DC

## 2021-10-23 MED ORDER — LABETALOL HCL 5 MG/ML IV SOLN: 10.0000 mg | INTRAVENOUS | Status: DC | PRN

## 2021-10-23 MED ORDER — HYDRALAZINE HCL 20 MG/ML IJ SOLN: 5.0000 mg | INTRAMUSCULAR | Status: DC | PRN

## 2021-10-23 MED ORDER — FENTANYL CITRATE (PF) 100 MCG/2ML IJ SOLN
INTRAMUSCULAR | Status: AC
  Filled 2021-10-23: qty 2

## 2021-10-23 MED ORDER — SODIUM CHLORIDE 0.9 % IV SOLN: 250.0000 mL | INTRAVENOUS | Status: DC | PRN

## 2021-10-23 MED ORDER — MIDAZOLAM HCL 2 MG/2ML IJ SOLN
INTRAMUSCULAR | Status: AC
  Filled 2021-10-23: qty 2

## 2021-10-23 MED ORDER — HEPARIN (PORCINE) IN NACL 1000-0.9 UT/500ML-% IV SOLN
INTRAVENOUS | Status: DC | PRN
  Administered 2021-10-23 (×2): 500 mL

## 2021-10-23 SURGICAL SUPPLY — 15 items
BALLN MUSTANG 5X60X135 (BALLOONS) ×1
BALLOON MUSTANG 5X60X135 (BALLOONS) IMPLANT
CATH OMNI FLUSH 5F 65CM (CATHETERS) IMPLANT
CLOSURE MYNX CONTROL 6F/7F (Vascular Products) IMPLANT
GLIDEWIRE ADV .035X260CM (WIRE) IMPLANT
KIT MICROPUNCTURE NIT STIFF (SHEATH) IMPLANT
KIT PV (KITS) ×2 IMPLANT
SHEATH FLEX ANSEL ANG 6F 45CM (SHEATH) IMPLANT
SHEATH PINNACLE 5F 10CM (SHEATH) IMPLANT
SHEATH PINNACLE 6F 10CM (SHEATH) IMPLANT
STENT ELUVIA 6X60X130 (Permanent Stent) IMPLANT
SYR MEDRAD MARK V 150ML (SYRINGE) IMPLANT
TRANSDUCER W/STOPCOCK (MISCELLANEOUS) ×2 IMPLANT
TRAY PV CATH (CUSTOM PROCEDURE TRAY) ×2 IMPLANT
WIRE BENTSON .035X145CM (WIRE) IMPLANT

## 2021-10-23 NOTE — H&P (Signed)
HISTORY AND PHYSICAL        CC:  follow up. Requesting Provider:  Sonia Side., FNP   HPI: This is a 71 y.o. female who is here today for follow up for PAD.  Pt has hx of coronary artery disease with diabetes, hyperlipidemia and hypertension.    She has hx of right CEA, which occluded and a known left carotid bruit that is followed.     Pt was last seen 08/02/2021 and at that time, she was having cramping with walking in her calf and foot at night.  She did not have any tissue loss. She had a diminished toe pressure and was scheduled for angiogram, which was on 08/07/2021 at which time she underwent  Stent of right SFA  by Dr. Donzetta Matters.  Dr. Donzetta Matters noted that if this stent fails, she would need a right CFA to BK popliteal artery bypass.     The pt returns today for follow up.  She states overall she is doing well.  She has some numbness in her feet that was present prior to intervention.  She is diabetic and takes gabapentin.  She does not have rest pain or ulcers.  She is a smoker but hasn't smoked in a couple of days.   The pt is on a statin for cholesterol management.    The pt is on an aspirin.    Other AC:  Plavix The pt is on CCB for hypertension.  The pt does  have diabetes. Tobacco hx:  current           Past Medical History:  Diagnosis Date   Anemia     Arthritis     Blood transfusion without reported diagnosis     Carotid artery narrowing     Coronary artery disease      Cardiac catheterization November 2013: 50% ostial LAD stenosis 50% mid stenosis. 30% disease in the left circumflex.   Diabetes mellitus, type 2 ([address])     Hyperlipidemia     Hypertension     Onychomycosis of toenail 07/31/2016   Sleep apnea         Had surgery to correct           Past Surgical History:  Procedure Laterality Date   ABDOMINAL AORTOGRAM W/LOWER EXTREMITY N/A 08/07/2021    Procedure: ABDOMINAL AORTOGRAM W/LOWER EXTREMITY;  Surgeon: Waynetta Sandy, MD;  Location: [facility];  Service: Cardiovascular;  Laterality: N/A;   ABDOMINAL HYSTERECTOMY       APPLICATION OF INTRAOPERATIVE CT SCAN N/A 11/30/2020    Procedure: APPLICATION OF INTRAOPERATIVE CT SCAN;  Surgeon: Eustace Moore, MD;  Location: [facility];  Service: Neurosurgery;  Laterality: N/A;   BACK SURGERY       CARDIAC CATHETERIZATION       CATARACT EXTRACTION, BILATERAL Bilateral 2021    Dr. Sherral Hammers   ENDARTERECTOMY   09/27/2011    Procedure: RIGT ENDARTERECTOMY CAROTID;  Surgeon: Mal Misty, MD;  Location: [facility];  Service: Vascular;  Laterality: Right;   EPIDURAL BLOCK INJECTION   02/2008    Drs. Eulis Manly   gyn surgery   2004    total hysterectomy for mennorhagia,,salpingoophorectomy   LAMINECTOMY WITH POSTERIOR LATERAL ARTHRODESIS LEVEL 2 N/A 11/30/2020    Procedure: LUMBAR FOUR-FIVE, LUMBAR FIVE-SACRAL ONE POSTERIOR LATERAL FUSION WITH REVISION OF LUMBAR ONE-FIVE HARDWARE AND EXTENSION TO SACRAL ONE AND SACRAL TWO;  Surgeon: Eustace Moore, MD;  Location:  [facility];  Service: Neurosurgery;  Laterality: N/A;   LUMBAR WOUND DEBRIDEMENT N/A 05/25/2020    Procedure: LUMBAR WOUND IRRIGATION AND DEBRIDEMENT;  Surgeon: Eustace Moore, MD;  Location: [place];  Service: Neurosurgery;  Laterality: N/A;   PERIPHERAL VASCULAR INTERVENTION   08/07/2021    Procedure: PERIPHERAL VASCULAR INTERVENTION;  Surgeon: Waynetta Sandy, MD;  Location: [facility];  Service: Cardiovascular;;  Rt SFA   POSTERIOR LUMBAR FUSION 4 LEVEL N/A 04/25/2020    Procedure: POSTERIOR LUMBAR INTERBODY FUSION LUMBAR ONE-TWO, LUMBAR TWO-THREE, LUMBAR THREE-FOUR,LUMBAR FOUR-FIVE.;  Surgeon: Eustace Moore, MD;  Location: [place] [place];  Service: Neurosurgery;  Laterality: N/A;  posterior   TONSILLECTOMY       TOTAL ABDOMINAL HYSTERECTOMY W/ BILATERAL SALPINGOOPHORECTOMY Bilateral 2000   TOTAL HIP ARTHROPLASTY Right 08/22/2018    Procedure: TOTAL HIP ARTHROPLASTY;  Surgeon: Earlie Server, MD;  Location: [facility];   Service: Orthopedics;  Laterality: Right;   VARICOSE VEIN SURGERY   2008    stripping           Allergies  Allergen Reactions   Zestoretic [Lisinopril-Hydrochlorothiazide] Swelling      Angioedema  - Tongue swelling    Codeine Itching   Oxycodone Itching   Ultram [Tramadol] Itching   Zocor [Simvastatin] Other (See Comments)      Muscle pain            Current Outpatient Medications  Medication Sig Dispense Refill   allopurinol (ZYLOPRIM) 300 MG tablet Take 1 tablet (300 mg total) by mouth daily. 90 tablet 3   amLODipine (NORVASC) 10 MG tablet Take 10 mg by mouth in the morning.       aspirin EC 81 MG tablet Take 81 mg by mouth in the morning.       atorvastatin (LIPITOR) 80 MG tablet TAKE 1 TABLET BY MOUTH ONCE DAILY WITH  EVENING  MEAL (Patient taking differently: Take 80 mg by mouth in the morning.) 90 tablet 3   blood glucose meter kit and supplies KIT Check sugars twice daily 1 each 0   carvedilol (COREG CR) 20 MG 24 hr capsule Take 1 capsule (20 mg total) by mouth daily. 90 capsule 3   clopidogrel (PLAVIX) 75 MG tablet Take 75 mg by mouth in the morning.       ezetimibe (ZETIA) 10 MG tablet TAKE 1 TABLET EVERY DAY (NEED MD APPOINTMENT) 90 tablet 0   ferrous sulfate 325 (65 FE) MG tablet Take 325 mg by mouth in the morning and at bedtime.       gabapentin (NEURONTIN) 300 MG capsule Take 300 mg by mouth 3 (three) times daily.       glucose blood test strip Check sugars twice daily 100 each 11   metFORMIN (GLUCOPHAGE) 500 MG tablet Take 500 mg by mouth 2 (two) times daily with a meal.       oxyCODONE-acetaminophen (PERCOCET) 10-325 MG tablet Take 1 tablet by mouth every 4 (four) hours as needed for pain. 40 tablet 0    No current facility-administered medications for this visit.           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      Comment: [employer]-retired 07/23/2014  Tobacco Use   Smoking status: Every Day      Packs/day: 0.10      Years: 50.00      Total pack years: 5.00      Types: Cigarettes      Start date: 05/31/1968   Smokeless tobacco: Never   Tobacco comments:      Smoked x50 years, 1 pack would last 3 days. Currently smoking 4 cigarettes per day  Vaping Use   Vaping Use: Never used  Substance and Sexual Activity   Alcohol use: Yes      Comment: 1-2 drinks every other day   Drug use: No   Sexual activity: Not on file  Other Topics Concern   Not on file  Social History Narrative    Lives with long term boyfriend (30 years together)    Both sons live in [place] Strain: Low Risk  (08/06/2018)    Overall Financial Resource Strain (CARDIA)     Difficulty of Paying Living Expenses: Not hard at all  Food Insecurity: No Food Insecurity (08/07/2019)    Hunger Vital Sign     Worried About Running Out of Food in the Last Year: Never true     Ran Out of Food in the Last Year: Never true  Transportation Needs: No Transportation Needs (08/07/2019)    PRAPARE - Armed forces logistics/support/administrative officer (Medical): No     Lack of Transportation (Non-Medical): No  Physical Activity: Inactive (06/17/2017)    Exercise Vital Sign     Days of Exercise per Week: 0 days     Minutes of Exercise per Session: 0 min  Stress: No Stress Concern Present (06/17/2017)    [place]      Feeling of Stress : Not at all  Social Connections: Unknown (08/06/2018)    Social Connection and Isolation Panel [NHANES]     Frequency of Communication with Friends and Family: Not on file     Frequency of Social Gatherings with Friends and Family: Not on file     Attends Religious Services: Not on file     Active Member of Clubs or Organizations: Not on file     Attends Archivist Meetings: Not on file     Marital Status: Living with partner  Intimate Partner Violence: Not At Risk (08/07/2019)    Humiliation, Afraid, Rape, and Kick questionnaire     Fear of Current or Ex-Partner: No     Emotionally Abused: No     Physically Abused: No     Sexually Abused: No        REVIEW OF SYSTEMS:    _0  denotes positive finding, _1  denotes negative finding Cardiac   Comments:  Chest pain or chest pressure:      Shortness of breath upon exertion:  Short of breath when lying flat:      Irregular heart rhythm:             Vascular      Pain in calf, thigh, or hip brought on by ambulation:      Pain in feet at night that wakes you up from your sleep:       Blood clot in your veins:      Leg swelling:              Pulmonary      Oxygen at home:      Productive cough:       Wheezing:              Neurologic      Sudden weakness in arms or legs:       Sudden numbness in arms or legs:       Sudden onset of difficulty speaking or slurred speech:      Temporary loss of vision in one eye:       Problems with dizziness:              Gastrointestinal      Blood in stool:       Vomited blood:              Genitourinary      Burning when urinating:       Blood in urine:             Psychiatric      Major depression:              Hematologic      Bleeding problems:      Problems with blood clotting too easily:             Skin      Rashes or ulcers:             Constitutional      Fever or chills:          PHYSICAL EXAMINATION: Vitals:   10/23/21 0617  BP: (!)  118/54  Pulse: 66  Resp: 20  Temp: 97.9 F (36.6 C)  SpO2: 98%      General:  WDWN in NAD; vital signs documented above Gait: Not observed HENT: WNL, normocephalic Pulmonary: normal non-labored breathing , without wheezing Cardiac: regular HR, with carotid bruit on the left Abdomen: soft, NT, no masses; aortic pulse is not palpable Skin: without rashes Vascular Exam/Pulses:   Right Left  Radial 2+ (normal) 2+ (normal)  Femoral 2+ (normal) 2+ (normal)  Popliteal Unable to palpate Unable to palpate  DP Unable to palpate 2+ (normal)  PT Unable to palpate Unable to palpate    Extremities: without ischemic changes, without Gangrene , without cellulitis; without open wounds Musculoskeletal: no muscle wasting or atrophy       Neurologic: A&O X 3 Psychiatric:  The pt has Normal affect.     Non-Invasive Vascular Imaging:   ABI's/TBI's on 09/20/2021: Right:  0.85/0.49 - Great toe pressure: 68 Left:  1.07/0.54 - Great toe pressure: 74   Arterial duplex on 09/20/2021: +----------+--------+-----+---------------+----------+--------+  RIGHT     PSV cm/sRatioStenosis       Waveform  Comments  +----------+--------+-----+---------------+----------+--------+  CFA [id number]                         triphasic           +----------+--------+-----+---------------+----------+--------+  DFA  94                          triphasic           +----------+--------+-----+---------------+----------+--------+  SFA Prox  609          75-99% stenosismonophasic          +----------+--------+-----+---------------+----------+--------+  SFA Mid   94                          monophasic          +----------+--------+-----+---------------+----------+--------+  SFA Distal                                      stent     +----------+--------+-----+---------------+----------+--------+  POP Prox  76                          biphasic             +----------+--------+-----+---------------+----------+--------+     Right Stent(s):  +---------------+--------+--------+----------+--------+  SFA            PSV cm/sStenosisWaveform  Comments  +---------------+--------+--------+----------+--------+  Prox to Stent  85              monophasic          +---------------+--------+--------+----------+--------+  Proximal Stent 66              monophasic          +---------------+--------+--------+----------+--------+  Mid Stent      77              monophasic          +---------------+--------+--------+----------+--------+  Distal Stent   44              monophasic          +---------------+--------+--------+----------+--------+  Distal to Stent66              monophasic          +---------------+--------+--------+----------+--------+   Summary:  Right: Elevated velocities suggestive of a 75-99% stenosis in the proximal superficial femoral artery. Patent stent with no evidence of stenosis in the superficial femoral artery.    Previous ABI's/TBI's on 08/02/2021: Right:  0.68/0.18 - Great toe pressure: 31 Left:  0.96/0.54 - Great toe pressure:  92   Previous arterial duplex on 08/02/2021: +----------+--------+-----+--------+----------+--------+  RIGHT     PSV cm/sRatioStenosisWaveform  Comments  +----------+--------+-----+--------+----------+--------+  CFA Distal121                  triphasic           +----------+--------+-----+--------+----------+--------+  DFA       74                   biphasic            +----------+--------+-----+--------+----------+--------+  SFA Prox  75                   biphasic            +----------+--------+-----+--------+----------+--------+  SFA Mid   80                   monophasic          +----------+--------+-----+--------+----------+--------+  SFA Distal0  occluded                     +----------+--------+-----+--------+----------+--------+  POP Prox  36                   biphasic            +----------+--------+-----+--------+----------+--------+  POP Distal53                   biphasic            +----------+--------+-----+--------+----------+--------+  ATA Distal29                   monophasic          +----------+--------+-----+--------+----------+--------+  PTA Distal34                   monophasic          +----------+--------+-----+--------+----------+--------+    ASSESSMENT/PLAN:: 71 y.o. female here for follow up for PAD with hx of angiogram, with left SFA stent on 08/07/2021  by Dr. Donzetta Matters.   PAD -pt ABI improved, however, she has a significant increased velocity in the SFA proximal to the stent of 609cm/s.  The stent that was placed is patent without stenosis.  I discussed with Dr. Scot Dock and will schedule pt for angiogram with Dr. Donzetta Matters to evaluate stenosis.  Pt knows to call sooner if she has issues before then. -continue asa/statin/plavix   Carotid stenosis -last duplex in November revealed occluded right ICA and 60-79% left ICA stenosis.  -she remains asymptomatic.  She will need carotid duplex when she returns after her angiogram when she has her RLE arterial duplex and ABI 6 weeks after angiogram -she knows to call 911 if she develops any temporary vision loss, speech difficulties or unilateral weakness, numbness or paralysis.   Current smoker -discussed importance of smoking cessation.  She has not smoked for a couple of days.  I congratulated her for that and encouraged her to continue this.      Yaqueline Gutter C. Donzetta Matters, MD [facility] Office: [phone] Pager: [phone]

## 2021-10-23 NOTE — Op Note (Signed)
    Patient name: April Shaffer MRN: [id number] DOB: Oct 08, 1950 Sex: female  10/23/2021 Pre-operative Diagnosis: Right SFA stenosis status post intervention Post-operative diagnosis:  Same Surgeon:  Luanna Salk. Randie Heinz, MD Procedure Performed: 1.  Ultrasound-guided cannulation left common femoral artery 2.  Right lower extremity angiography 3.  Stent of right SFA with 6 x 60 mm Eluvia 4.  Mynx device closure left common femoral artery 5.  Moderate sedation with fentanyl and Versed for 25 minutes  Indications: 71 year old female with history of right lower extremity rest pain underwent recent intervention of her right SFA now has tight stenosis cephalad to the stent and is indicated for repeat intervention to prevent stent thrombosis.  Findings: The aorta and iliac segments were not evaluated given her significant hardware and the fact that she was evaluated 2 months ago.  She does have a palpable right common femoral pulse.  There is two-vessel runoff via the anterior tibial and posterior tibial arteries to the foot.  There is a 95% stenosis proximal SFA and this was stented to 0% residual stenosis.  Previous stent is patent without any flow-limiting stenosis.   Procedure:  The patient was identified in the holding area and taken to room 8.  The patient was then placed supine on the table and prepped and draped in the usual sterile fashion.  A time out was called.  Ultrasound was used to evaluate the left common femoral artery.  There was some calcium and some scar tissue from recent intervention.  The area was anesthetized 1% lidocaine cannulated by functional followed by wire and sheath.  Concomitantly we administered fentanyl and Versed and her vital signs were monitored throughout the case.  We then placed a Bentson wire followed by a 5 French sheath and crossed the bifurcation with Omni catheter and Bentson wire and performed right lower extremity angiography.  With this we then went up and over  the bifurcation with a 6 French sheath patient was given 5000 units of heparin.  We crossed the stenosis with Glidewire advantage confirm intraluminal access primarily stented with a 6 x 60 mm drug-eluting stent and postdilated with a 5 x 60 mm balloon.  Completion demonstrated no residual stenosis with preserved runoff via anterior tibial and posterior tibial arteries to the foot.  Satisfied with this we exchanged for short 6 French sheath and deployed a minx device.  She tolerated the procedure without immediate complication.   Contrast: 70 cc  Kylian Loh C. Randie Heinz, MD [facility] Office: [phone] Pager: [phone]

## 2021-10-23 NOTE — Progress Notes (Signed)
Patient BP was running soft. Dr ordered a bolus. Will continue to monitor.

## 2021-10-23 NOTE — Telephone Encounter (Signed)
-----   Message from Maeola Harman, MD sent at 10/23/2021  8:33 AM EDT ----- April Shaffer [id number] 1950/09/21  10/23/2021 Pre-operative Diagnosis: Right SFA stenosis status post intervention  Surgeon:  Luanna Salk. Randie Heinz, MD  Procedure Performed: 1.  Ultrasound-guided cannulation left common femoral artery 2.  Right lower extremity angiography 3.  Stent of right SFA with 6 x 60 mm Eluvia 4.  Mynx device closure left common femoral artery 5.  Moderate sedation with fentanyl and Versed for 25 minutes  Follow-up 5 to 6 weeks with PA extremity duplex and ABIs  [name]

## 2021-10-23 NOTE — Progress Notes (Signed)
Patient was given discharge instructions. She verbalized understanding. 

## 2021-10-27 ENCOUNTER — Telehealth: Payer: Self-pay

## 2021-10-27 NOTE — Telephone Encounter (Signed)
LVM stating that Santiago Glad, NP has reviewed the pt's recent labs and that the pt's anemia has improved with the B12 injections.  Informed pt that Lacie would like for the pt to continue monthly B12 injections and f/u as scheduled.  Instructed pt to contact Lacie's office should she have additional questions or concerns.

## 2021-10-31 ENCOUNTER — Telehealth: Payer: Self-pay

## 2021-10-31 NOTE — Telephone Encounter (Signed)
This nurse returned call to patient related to message that she received a call about her B12 injections but she did not get the entire message.  This nurse advised that her anemia has improved since being on the B12 and the provider would like for her to continue taking the B12 injections each month.  Reminded patient that next scheduled injection will be 9/13 at 11 am.  Patient knows to call clinic if she has any further questions or concerns.

## 2021-10-31 NOTE — Telephone Encounter (Signed)
Appt has been scheduled.

## 2021-11-08 ENCOUNTER — Other Ambulatory Visit: Payer: Self-pay

## 2021-11-08 ENCOUNTER — Inpatient Hospital Stay: Payer: Medicare HMO | Attending: Nurse Practitioner

## 2021-11-08 DIAGNOSIS — D649 Anemia, unspecified: Secondary | ICD-10-CM | POA: Diagnosis present

## 2021-11-08 DIAGNOSIS — E538 Deficiency of other specified B group vitamins: Secondary | ICD-10-CM | POA: Diagnosis present

## 2021-11-08 MED ORDER — CYANOCOBALAMIN 1000 MCG/ML IJ SOLN
1000.0000 ug | Freq: Once | INTRAMUSCULAR | Status: AC
  Administered 2021-11-08: 1000 ug via INTRAMUSCULAR
  Filled 2021-11-08: qty 1

## 2021-12-05 ENCOUNTER — Other Ambulatory Visit: Payer: Self-pay | Admitting: *Deleted

## 2021-12-05 DIAGNOSIS — I739 Peripheral vascular disease, unspecified: Secondary | ICD-10-CM

## 2021-12-06 ENCOUNTER — Inpatient Hospital Stay: Payer: Medicare HMO

## 2021-12-06 ENCOUNTER — Other Ambulatory Visit: Payer: Self-pay

## 2021-12-06 ENCOUNTER — Other Ambulatory Visit: Payer: Self-pay | Admitting: *Deleted

## 2021-12-06 ENCOUNTER — Inpatient Hospital Stay: Payer: Medicare HMO | Attending: Nurse Practitioner | Admitting: Hematology

## 2021-12-06 ENCOUNTER — Encounter: Payer: Self-pay | Admitting: Hematology

## 2021-12-06 VITALS — BP 162/69 | HR 87 | Temp 97.7°F | Resp 18 | Ht 63.0 in | Wt 139.4 lb

## 2021-12-06 DIAGNOSIS — D649 Anemia, unspecified: Secondary | ICD-10-CM | POA: Insufficient documentation

## 2021-12-06 DIAGNOSIS — E538 Deficiency of other specified B group vitamins: Secondary | ICD-10-CM

## 2021-12-06 LAB — CBC WITH DIFFERENTIAL (CANCER CENTER ONLY)
Abs Immature Granulocytes: 0.03 10*3/uL (ref 0.00–0.07)
Basophils Absolute: 0.1 10*3/uL (ref 0.0–0.1)
Basophils Relative: 1 %
Eosinophils Absolute: 0.2 10*3/uL (ref 0.0–0.5)
Eosinophils Relative: 3 %
HCT: 30.7 % — ABNORMAL LOW (ref 36.0–46.0)
Hemoglobin: 9.7 g/dL — ABNORMAL LOW (ref 12.0–15.0)
Immature Granulocytes: 0 %
Lymphocytes Relative: 29 %
Lymphs Abs: 2.5 10*3/uL (ref 0.7–4.0)
MCH: 23.5 pg — ABNORMAL LOW (ref 26.0–34.0)
MCHC: 31.6 g/dL (ref 30.0–36.0)
MCV: 74.3 fL — ABNORMAL LOW (ref 80.0–100.0)
Monocytes Absolute: 0.8 10*3/uL (ref 0.1–1.0)
Monocytes Relative: 9 %
Neutro Abs: 5 10*3/uL (ref 1.7–7.7)
Neutrophils Relative %: 58 %
Platelet Count: 361 10*3/uL (ref 150–400)
RBC: 4.13 MIL/uL (ref 3.87–5.11)
RDW: 19.1 % — ABNORMAL HIGH (ref 11.5–15.5)
WBC Count: 8.7 10*3/uL (ref 4.0–10.5)
nRBC: 0 % (ref 0.0–0.2)

## 2021-12-06 LAB — IRON AND IRON BINDING CAPACITY (CC-WL,HP ONLY)
Iron: 19 ug/dL — ABNORMAL LOW (ref 28–170)
Saturation Ratios: 7 % — ABNORMAL LOW (ref 10.4–31.8)
TIBC: 275 ug/dL (ref 250–450)
UIBC: 256 ug/dL

## 2021-12-06 LAB — VITAMIN B12: Vitamin B-12: 566 pg/mL (ref 180–914)

## 2021-12-06 MED ORDER — CYANOCOBALAMIN 1000 MCG/ML IJ SOLN
1000.0000 ug | Freq: Once | INTRAMUSCULAR | Status: AC
  Administered 2021-12-06: 1000 ug via INTRAMUSCULAR
  Filled 2021-12-06: qty 1

## 2021-12-06 NOTE — Patient Instructions (Signed)
Vitamin B12 Deficiency Vitamin B12 deficiency occurs when the body does not have enough of this important vitamin. The body needs this vitamin: To make red blood cells. To make DNA. This is the genetic material inside cells. To help the nerves work properly so they can carry messages from the brain to the body. Vitamin B12 deficiency can cause health problems, such as not having enough red blood cells in the blood (anemia). This can lead to nerve damage if untreated. What are the causes? This condition may be caused by: Not eating enough foods that contain vitamin B12. Not having enough stomach acid and digestive fluids to properly absorb vitamin B12 from the food that you eat. Having certain diseases that make it hard to absorb vitamin B12. These diseases include Crohn's disease, chronic pancreatitis, and cystic fibrosis. An autoimmune disorder in which the body does not make enough of a protein (intrinsic factor) within the stomach, resulting in not enough absorption of vitamin B12. Having a surgery in which part of the stomach or small intestine is removed. Taking certain medicines that make it hard for the body to absorb vitamin B12. These include: Heartburn medicines, such as antacids and proton pump inhibitors. Some medicines that are used to treat diabetes. What increases the risk? The following factors may make you more likely to develop a vitamin B12 deficiency: Being an older adult. Eating a vegetarian or vegan diet that does not include any foods that come from animals. Eating a poor diet while you are pregnant. Taking certain medicines. Having alcoholism. What are the signs or symptoms? In some cases, there are no symptoms of this condition. If the condition leads to anemia or nerve damage, various symptoms may occur, such as: Weakness. Tiredness (fatigue). Loss of appetite. Numbness or tingling in your hands and feet. Redness and burning of the tongue. Depression,  confusion, or memory problems. Trouble walking. If anemia is severe, symptoms can include: Shortness of breath. Dizziness. Rapid heart rate. How is this diagnosed? This condition may be diagnosed with a blood test to measure the level of vitamin B12 in your blood. You may also have other tests, including: A group of tests that measure certain characteristics of blood cells (complete blood count, CBC). A blood test to measure intrinsic factor. A procedure where a thin tube with a camera on the end is used to look into your stomach or intestines (endoscopy). Other tests may be needed to discover the cause of the deficiency. How is this treated? Treatment for this condition depends on the cause. This condition may be treated by: Changing your eating and drinking habits, such as: Eating more foods that contain vitamin B12. Drinking less alcohol or no alcohol. Getting vitamin B12 injections. Taking vitamin B12 supplements by mouth (orally). Your health care provider will tell you which dose is best for you. Follow these instructions at home: Eating and drinking  Include foods in your diet that come from animals and contain a lot of vitamin B12. These include: Meats and poultry. This includes beef, pork, chicken, turkey, and organ meats, such as liver. Seafood. This includes clams, rainbow trout, salmon, tuna, and haddock. Eggs. Dairy foods such as milk, yogurt, and cheese. Eat foods that have vitamin B12 added to them (are fortified), such as ready-to-eat breakfast cereals. Check the label on the package to see if a food is fortified. The items listed above may not be a complete list of foods and beverages you can eat and drink. Contact a dietitian for   more information. Alcohol use Do not drink alcohol if: Your health care provider tells you not to drink. You are pregnant, may be pregnant, or are planning to become pregnant. If you drink alcohol: Limit how much you have to: 0-1 drink a  day for women. 0-2 drinks a day for men. Know how much alcohol is in your drink. In the [place] one drink equals one 12 oz bottle of beer (355 mL), one 5 oz glass of wine (148 mL), or one 1 oz glass of hard liquor (44 mL). General instructions Get vitamin B12 injections if told to by your health care provider. Take supplements only as told by your health care provider. Follow the directions carefully. Keep all follow-up visits. This is important. Contact a health care provider if: Your symptoms come back. Your symptoms get worse or do not improve with treatment. Get help right away: You develop shortness of breath. You have a rapid heart rate. You have chest pain. You become dizzy or you faint. These symptoms may be an emergency. Get help right away. Call 911. Do not wait to see if the symptoms will go away. Do not drive yourself to the hospital. Summary Vitamin B12 deficiency occurs when the body does not have enough of this important vitamin. Common causes include not eating enough foods that contain vitamin B12, not being able to absorb vitamin B12 from the food that you eat, having a surgery in which part of the stomach or small intestine is removed, or taking certain medicines. Eat foods that have vitamin B12 in them. Treatment may include making a change in the way you eat and drink, getting vitamin B12 injections, or taking vitamin B12 supplements. This information is not intended to replace advice given to you by your health care provider. Make sure you discuss any questions you have with your health care provider. Document Revised: 10/07/2020 Document Reviewed: 10/07/2020 Elsevier Patient Education  2023 [employer] Inc.  

## 2021-12-06 NOTE — Progress Notes (Signed)
[facility]   [phone] [phone]   Clinic Follow up Note   Patient Care Team: Sonia Side., FNP as PCP - General (Family Medicine) Lorretta Harp, MD as PCP - Cardiology (Cardiology) Earlie Server, MD as Consulting Physician (Orthopedic Surgery) Truitt Merle, MD as Consulting Physician (Hematology) Alla Feeling, NP as Nurse Practitioner (Nurse Practitioner) Sonia Side., FNP as Nurse Practitioner (Family Medicine) Eustace Moore, MD as Consulting Physician (Neurosurgery)  Date of Service:  12/06/2021  CHIEF COMPLAINT: f/u of anemia  CURRENT THERAPY:  B12 injections, starting 08/31/21, currently monthly Iv venofer as needed   ASSESSMENT & PLAN:  April Shaffer is a 71 y.o. female with   1. Anemia, B12 deficiency, iron deficiency and anemia of chronic disease  -She has had mild intermittent anemia with hgb 9-11 range since at least 2011. Anemia resolved briefly from 2020-2022, recurred following surgeries in 2022 requiring 2u pRBC. recently has been stable 9.5 -10.5 range -iron panel 08/01/21 indicative of anemia of chronic disease rather than true iron deficiency -labs from 08/21/21 showed Hgb 8.8, no other cytopenias or evidence of hemolysis, CKD, normal ferritin, MM panel and light chains are abnormal but nonspecific, no concern for multiple myeloma. She has low B12 and elevated MMA which confirms B12 deficiency.   -intrinsic factor from 08/31/21 was normal  -she began B12 injections on 08/31/21, weekly through 10/11/21 and now on monthly. -labs reviewed, her hgb improved some, up to 10.2, but is not up to where it needs to be for hip surgery. She will receive B12 injection today, and I will also check her iron level. Given her persistent microcytosis I suspect she has IDA also. If it is low, we will plan to give IV iron. I discussed that if her anemia persists, I may recommend bone marrow biopsy before start her on ESA.   2.  Co-morbidities: HTN, HL, DM, CAD, carotid artery stenosis/occlusion, OA, multiple back surgeries and post op infection -Her anemia worsened after neurosurgery and post-op infection, s/p blood transfusion in April and October 2022 -she likely has a component of anemia of chronic disease  -continue PCP, ortho, neurosurgery, cardiology, and vascular surgery f/up     Plan: -proceed with B12 injection today -will check iron on labs obtained today to see if she needs IV iron. -lab, f/u, and B12 injection in 1 month  Addendum Her serum iron and saturation came back low, I will give iv venofer 314m X3, to see if her anemia improves.   No problem-specific Assessment & Plan notes found for this encounter.   INTERVAL HISTORY:  April LISBONis here for a follow up of anemia. She was last seen by NP Lacie on 08/31/21. She presents to the clinic alone. She reports she is doing well overall. She explains her back doctor released her from f/u. She notes she is still using a cane but hopes to not need eventually. She reports she can tell a difference from the B12 injections. She explains she still feels cold but overall better.   All other systems were reviewed with the patient and are negative.  MEDICAL HISTORY:  Past Medical History:  Diagnosis Date   Anemia    Arthritis    Blood transfusion without reported diagnosis    Carotid artery narrowing    Coronary artery disease    Cardiac catheterization November 2013: 50% ostial LAD stenosis 50% mid stenosis. 30% disease in the left circumflex.  Diabetes mellitus, type 2 ([place])    Hyperlipidemia    Hypertension    Onychomycosis of toenail 07/31/2016   Sleep apnea       Had surgery to correct    SURGICAL HISTORY: Past Surgical History:  Procedure Laterality Date   ABDOMINAL AORTOGRAM W/LOWER EXTREMITY N/A 08/07/2021   Procedure: ABDOMINAL AORTOGRAM W/LOWER EXTREMITY;  Surgeon: Waynetta Sandy, MD;  Location: [facility];   Service: Cardiovascular;  Laterality: N/A;   ABDOMINAL AORTOGRAM W/LOWER EXTREMITY Right 10/23/2021   Procedure: ABDOMINAL AORTOGRAM W/LOWER EXTREMITY;  Surgeon: Waynetta Sandy, MD;  Location: [facility];  Service: Cardiovascular;  Laterality: Right;   ABDOMINAL HYSTERECTOMY     APPLICATION OF INTRAOPERATIVE CT SCAN N/A 11/30/2020   Procedure: APPLICATION OF INTRAOPERATIVE CT SCAN;  Surgeon: Eustace Moore, MD;  Location: [facility];  Service: Neurosurgery;  Laterality: N/A;   BACK SURGERY     CARDIAC CATHETERIZATION     CATARACT EXTRACTION, BILATERAL Bilateral 2021   Dr. Sherral Hammers   ENDARTERECTOMY  09/27/2011   Procedure: RIGT ENDARTERECTOMY CAROTID;  Surgeon: Mal Misty, MD;  Location: [facility];  Service: Vascular;  Laterality: Right;   EPIDURAL BLOCK INJECTION  02/2008   Drs. Eulis Manly   gyn surgery  2004   total hysterectomy for mennorhagia,,salpingoophorectomy   LAMINECTOMY WITH POSTERIOR LATERAL ARTHRODESIS LEVEL 2 N/A 11/30/2020   Procedure: LUMBAR FOUR-FIVE, LUMBAR FIVE-SACRAL ONE POSTERIOR LATERAL FUSION WITH REVISION OF LUMBAR ONE-FIVE HARDWARE AND EXTENSION TO SACRAL ONE AND SACRAL TWO;  Surgeon: Eustace Moore, MD;  Location: [facility];  Service: Neurosurgery;  Laterality: N/A;   LUMBAR WOUND DEBRIDEMENT N/A 05/25/2020   Procedure: LUMBAR WOUND IRRIGATION AND DEBRIDEMENT;  Surgeon: Eustace Moore, MD;  Location: [facility];  Service: Neurosurgery;  Laterality: N/A;   PERIPHERAL VASCULAR INTERVENTION  08/07/2021   Procedure: PERIPHERAL VASCULAR INTERVENTION;  Surgeon: Waynetta Sandy, MD;  Location: [facility];  Service: Cardiovascular;;  Rt SFA   PERIPHERAL VASCULAR INTERVENTION Right 10/23/2021   Procedure: PERIPHERAL VASCULAR INTERVENTION;  Surgeon: Waynetta Sandy, MD;  Location: [facility];  Service: Cardiovascular;  Laterality: Right;  SFA   POSTERIOR LUMBAR FUSION 4 LEVEL N/A 04/25/2020   Procedure: POSTERIOR LUMBAR INTERBODY  FUSION LUMBAR ONE-TWO, LUMBAR TWO-THREE, LUMBAR THREE-FOUR,LUMBAR FOUR-FIVE.;  Surgeon: Eustace Moore, MD;  Location: [facility];  Service: Neurosurgery;  Laterality: N/A;  posterior   TONSILLECTOMY     TOTAL ABDOMINAL HYSTERECTOMY W/ BILATERAL SALPINGOOPHORECTOMY Bilateral 2000   TOTAL HIP ARTHROPLASTY Right 08/22/2018   Procedure: TOTAL HIP ARTHROPLASTY;  Surgeon: Earlie Server, MD;  Location: [facility];  Service: Orthopedics;  Laterality: Right;   VARICOSE VEIN SURGERY  2008   stripping    I have reviewed the social history and family history with the patient and they are unchanged from previous note.  ALLERGIES:  is allergic to zestoretic [lisinopril-hydrochlorothiazide], codeine, oxycodone, ultram [tramadol], and zocor [simvastatin].  MEDICATIONS:  Current Outpatient Medications  Medication Sig Dispense Refill   allopurinol (ZYLOPRIM) 300 MG tablet Take 1 tablet (300 mg total) by mouth daily. 90 tablet 3   amLODipine (NORVASC) 10 MG tablet Take 10 mg by mouth in the morning.     aspirin EC 81 MG tablet Take 81 mg by mouth in the morning.     atorvastatin (LIPITOR) 80 MG tablet TAKE 1 TABLET BY MOUTH ONCE DAILY WITH  EVENING  MEAL (Patient taking differently: Take 80 mg by mouth in the morning.) 90  tablet 3   blood glucose meter kit and supplies KIT Check sugars twice daily 1 each 0   carvedilol (COREG CR) 20 MG 24 hr capsule Take 1 capsule (20 mg total) by mouth daily. 90 capsule 3   clopidogrel (PLAVIX) 75 MG tablet Take 75 mg by mouth in the morning.     Cyanocobalamin (VITAMIN B-12 IJ) Inject as directed every 30 (thirty) days.     ezetimibe (ZETIA) 10 MG tablet TAKE 1 TABLET EVERY DAY (NEED MD APPOINTMENT) 90 tablet 0   ferrous sulfate 325 (65 FE) MG tablet Take 325 mg by mouth in the morning and at bedtime.     gabapentin (NEURONTIN) 300 MG capsule Take 300 mg by mouth 3 (three) times daily.     glucose blood test strip Check sugars twice daily 100 each 11   metFORMIN (GLUCOPHAGE)  500 MG tablet Take 500 mg by mouth 2 (two) times daily with a meal.     Oxycodone HCl 10 MG TABS Take 10 mg by mouth every 6 (six) hours as needed (Pain).     oxyCODONE-acetaminophen (PERCOCET) 10-325 MG tablet Take 1 tablet by mouth every 4 (four) hours as needed for pain. 40 tablet 0   No current facility-administered medications for this visit.    PHYSICAL EXAMINATION: ECOG PERFORMANCE STATUS: 1 - Symptomatic but completely ambulatory  Vitals:   12/06/21 1131  BP: (!) 162/69  Pulse: 87  Resp: 18  Temp: 97.7 F (36.5 C)  SpO2: 100%   Wt Readings from Last 3 Encounters:  12/06/21 139 lb 6.4 oz (63.2 kg)  10/23/21 141 lb (64 kg)  09/20/21 141 lb 3.2 oz (64 kg)     GENERAL:alert, no distress and comfortable SKIN: skin color normal, no rashes or significant lesions EYES: normal, Conjunctiva are pink and non-injected, sclera clear  NEURO: alert & oriented x 3 with fluent speech  LABORATORY DATA:  I have reviewed the data as listed    Latest Ref Rng & Units 12/06/2021   11:18 AM 10/23/2021    6:23 AM 10/11/2021   10:13 AM  CBC  WBC 4.0 - 10.5 K/uL 8.7   9.7   Hemoglobin 12.0 - 15.0 g/dL 9.7  10.2  9.3   Hematocrit 36.0 - 46.0 % 30.7  30.0  29.9   Platelets 150 - 400 K/uL 361   451         Latest Ref Rng & Units 10/23/2021    6:23 AM 08/21/2021   12:21 PM 08/07/2021    1:16 PM  CMP  Glucose 70 - 99 mg/dL 106  114  102   BUN 8 - 23 mg/dL _0 Creatinine 0.44 - 1.00 mg/dL 0.90  0.77  0.40   Sodium 135 - 145 mmol/L 139  137  140   Potassium 3.5 - 5.1 mmol/L 3.7  4.6  4.6   Chloride 98 - 111 mmol/L 102  104  103   CO2 22 - 32 mmol/L  25    Calcium 8.9 - 10.3 mg/dL  9.0    Total Protein 6.5 - 8.1 g/dL  8.3    Total Bilirubin 0.3 - 1.2 mg/dL  0.5    Alkaline Phos 38 - 126 U/L  141    AST 15 - 41 U/L  13    ALT 0 - 44 U/L  7        RADIOGRAPHIC STUDIES: I have personally reviewed the radiological images as listed and  agreed with the findings in the  report. No results found.    No orders of the defined types were placed in this encounter.  All questions were answered. The patient knows to call the clinic with any problems, questions or concerns. No barriers to learning was detected. The total time spent in the appointment was 25 minutes.     Truitt Merle, MD 12/06/2021   I, Wilburn Mylar, am acting as scribe for Truitt Merle, MD.   I have reviewed the above documentation for accuracy and completeness, and I agree with the above.

## 2021-12-08 LAB — METHYLMALONIC ACID, SERUM: Methylmalonic Acid, Quantitative: 173 nmol/L (ref 0–378)

## 2021-12-13 ENCOUNTER — Ambulatory Visit (INDEPENDENT_AMBULATORY_CARE_PROVIDER_SITE_OTHER): Payer: Medicare HMO | Admitting: Physician Assistant

## 2021-12-13 ENCOUNTER — Telehealth: Payer: Self-pay

## 2021-12-13 ENCOUNTER — Other Ambulatory Visit: Payer: Self-pay

## 2021-12-13 ENCOUNTER — Ambulatory Visit (INDEPENDENT_AMBULATORY_CARE_PROVIDER_SITE_OTHER)
Admission: RE | Admit: 2021-12-13 | Discharge: 2021-12-13 | Disposition: A | Discharge status: Home / Self Care | Payer: Medicare HMO | Source: Ambulatory Visit | Attending: Vascular Surgery | Admitting: Vascular Surgery

## 2021-12-13 ENCOUNTER — Ambulatory Visit (HOSPITAL_COMMUNITY)
Admission: RE | Admit: 2021-12-13 | Discharge: 2021-12-13 | Disposition: A | Discharge status: Home / Self Care | Payer: Medicare HMO | Source: Ambulatory Visit | Attending: Vascular Surgery | Admitting: Vascular Surgery

## 2021-12-13 ENCOUNTER — Other Ambulatory Visit: Payer: Self-pay | Admitting: Nurse Practitioner

## 2021-12-13 VITALS — BP 133/69 | HR 69 | Temp 97.6°F | Ht 63.0 in | Wt 137.6 lb

## 2021-12-13 DIAGNOSIS — I6523 Occlusion and stenosis of bilateral carotid arteries: Secondary | ICD-10-CM | POA: Diagnosis not present

## 2021-12-13 DIAGNOSIS — I739 Peripheral vascular disease, unspecified: Secondary | ICD-10-CM

## 2021-12-13 DIAGNOSIS — I6521 Occlusion and stenosis of right carotid artery: Secondary | ICD-10-CM

## 2021-12-13 NOTE — Telephone Encounter (Signed)
Spoke with pt via telephone regarding recent lab results.  Informed pt that her B12 is WNL but her iron levels are low.  Informed pt that Dr. Burr Medico would like for her to get 3 treatments of IV Iron.  Pt agreed to getting IV Iron.  Informed pt that someone from our Scheduling Team will be contacting her to get her scheduled for her infusions.  Pt verbalized understanding and had no further question or concerns at this time.

## 2021-12-13 NOTE — Progress Notes (Signed)
Office Note   History of Present Illness   April Shaffer is a 71 y.o. (08-Oct-1950) female who presents for follow up of PAD.  She also has a history of coronary artery disease, diabetes, hyperlipidemia, and hypertension.  She has a history of right CEA, which has occluded.  She has known left carotid artery stenosis 60-79% that is also being followed.  She underwent right distal SFA stent by Dr. Donzetta Matters on 08/07/2021 for rest pain.  During this angiogram, Dr. Donzetta Matters noted that if the SFA stent fails, she would need a right CFA to BK popliteal artery bypass.  At her last visit with [place] on 09/20/2021, she was noted to have proximal R SFA stenosis with velocities up to 609.  She then underwent RLE angiogram with proximal SFA stent on 10/23/2021 by Dr. Donzetta Matters.  Postop she was to continue her aspirin, statin, plavix.  At follow-up today, she states that she is doing well.  She denies any rest pain, claudication, tissue loss in her lower extremities.  She has a history of occasional right foot numbness due to her diabetic neuropathy, to which she takes gabapentin.  She has been ambulating well.  She denies any stroke or TIA-like symptoms.  Current Outpatient Medications  Medication Sig Dispense Refill   allopurinol (ZYLOPRIM) 300 MG tablet Take 1 tablet (300 mg total) by mouth daily. 90 tablet 3   amLODipine (NORVASC) 10 MG tablet Take 10 mg by mouth in the morning.     aspirin EC 81 MG tablet Take 81 mg by mouth in the morning.     atorvastatin (LIPITOR) 80 MG tablet TAKE 1 TABLET BY MOUTH ONCE DAILY WITH  EVENING  MEAL (Patient taking differently: Take 80 mg by mouth in the morning.) 90 tablet 3   blood glucose meter kit and supplies KIT Check sugars twice daily 1 each 0   carvedilol (COREG CR) 20 MG 24 hr capsule Take 1 capsule (20 mg total) by mouth daily. 90 capsule 3   clopidogrel (PLAVIX) 75 MG tablet Take 75 mg by mouth in the morning.     Cyanocobalamin (VITAMIN B-12 IJ) Inject as directed every  30 (thirty) days.     ezetimibe (ZETIA) 10 MG tablet TAKE 1 TABLET EVERY DAY (NEED MD APPOINTMENT) 90 tablet 0   ferrous sulfate 325 (65 FE) MG tablet Take 325 mg by mouth in the morning and at bedtime.     gabapentin (NEURONTIN) 300 MG capsule Take 300 mg by mouth 3 (three) times daily.     glucose blood test strip Check sugars twice daily 100 each 11   metFORMIN (GLUCOPHAGE) 500 MG tablet Take 500 mg by mouth 2 (two) times daily with a meal.     Oxycodone HCl 10 MG TABS Take 10 mg by mouth every 6 (six) hours as needed (Pain).     oxyCODONE-acetaminophen (PERCOCET) 10-325 MG tablet Take 1 tablet by mouth every 4 (four) hours as needed for pain. 40 tablet 0   No current facility-administered medications for this visit.    REVIEW OF SYSTEMS (negative unless checked):   Cardiac:  _0  Chest pain or chest pressure? _1  Shortness of breath upon activity? _2  Shortness of breath when lying flat? _3  Irregular heart rhythm?  Vascular:  _4  Pain in calf, thigh, or hip brought on by walking? _5  Pain in feet at night that wakes you up from your sleep? _6  Blood clot in your veins? _7  Leg swelling?  Pulmonary:  _8  Oxygen  at home? _0  Productive cough? _1  Wheezing?  Neurologic:  _2  Sudden weakness in arms or legs? _3  Sudden numbness in arms or legs? _4  Sudden onset of difficult speaking or slurred speech? _5  Temporary loss of vision in one eye? _6  Problems with dizziness?  Gastrointestinal:  _7  Blood in stool? _8  Vomited blood?  Genitourinary:  _9  Burning when urinating? _10  Blood in urine?  Psychiatric:  _11  Major depression  Hematologic:  _12  Bleeding problems? _13  Problems with blood clotting?  Dermatologic:  _14  Rashes or ulcers?  Constitutional:  _15  Fever or chills?  Ear/Nose/Throat:  _16  Change in hearing? _17  Nose bleeds? _18  Sore throat?  Musculoskeletal:  _19  Back pain? _20  Joint pain? _21  Muscle pain?   Physical Examination   Vitals:   12/13/21 0935  BP: 133/69   Pulse: 69  Temp: 97.6 F (36.4 C)  TempSrc: Temporal  SpO2: 99%  Weight: 137 lb 9.6 oz (62.4 kg)  Height: _22  (1.6 m)   Body mass index is 24.37 kg/m.  General:  WDWN in NAD; vital signs documented above Gait: Not observed HENT: WNL, normocephalic Pulmonary: normal non-labored breathing , without Rales, rhonchi,  wheezing Cardiac: regular HR, without murmurs with left carotid bruit Abdomen: soft, NT, no masses Skin: without rashes Vascular Exam/Pulses: Palpable DP pulses bilaterally Extremities: without ischemic changes, without gangrene , without cellulitis; without open wounds;  Musculoskeletal: no muscle wasting or atrophy  Neurologic: A&O X 3;  No focal weakness or paresthesias are detected Psychiatric:  The pt has Normal affect.  Non-Invasive Vascular imaging   ABI (12/13/2021) +---------+------------------+-----+--------+--------+  Right    Rt Pressure (mmHg)IndexWaveformComment   +---------+------------------+-----+--------+--------+  Brachial 148                                      +---------+------------------+-----+--------+--------+  PTA      155               1.05 biphasic          +---------+------------------+-----+--------+--------+  DP       148               1.00 biphasic          +---------+------------------+-----+--------+--------+  Great Toe110               0.74                   +---------+------------------+-----+--------+--------+   +---------+------------------+-----+--------+-------+  Left     Lt Pressure (mmHg)IndexWaveformComment  +---------+------------------+-----+--------+-------+  Brachial 148                                     +---------+------------------+-----+--------+-------+  PTA      150               1.01 biphasic         +---------+------------------+-----+--------+-------+  DP       154               1.04 biphasic          +---------+------------------+-----+--------+-------+  Great Toe96                0.65                  +---------+------------------+-----+--------+-------+   +-------+-----------+-----------+------------+------------+  ABI/TBIToday's ABIToday's TBIPrevious ABIPrevious TBI  +-------+-----------+-----------+------------+------------+  Right  1.05       0.74       0.85        0.49          +-------+-----------+-----------+------------+------------+  Left   1.04       0.65       1.07        0.54          +-------+-----------+-----------+------------+------------+   RLE Arterial Duplex (12/13/2021) +---------------+--------+--------+--------+--------+  proximal SFA   PSV cm/sStenosisWaveformComments  +---------------+--------+--------+--------+--------+  Prox to Stent  147             biphasic          +---------------+--------+--------+--------+--------+  Proximal Stent 165             biphasic          +---------------+--------+--------+--------+--------+  Mid Stent      109             biphasic          +---------------+--------+--------+--------+--------+  Distal Stent   91              biphasic          +---------------+--------+--------+--------+--------+  Distal to Stent169             biphasic          +---------------+--------+--------+--------+--------+       +----------------+--------+--------+--------+--------+  distal SFA stentPSV cm/sStenosisWaveformComments  +----------------+--------+--------+--------+--------+  Prox to Stent   62              biphasic          +----------------+--------+--------+--------+--------+  Proximal Stent  72              biphasic          +----------------+--------+--------+--------+--------+  Mid Stent       72              biphasic          +----------------+--------+--------+--------+--------+  Distal Stent    60              biphasic           +----------------+--------+--------+--------+--------+  Distal to Stent 94              biphasic          +----------------+--------+--------+--------+--------+   Mid popliteal artery velocity 240 cm/s     Summary:  Right: Widely patent proximal SFA and distal SFA stenting.  Mid popliteal artery velocities suggest a 50-74% stenosis.   Medical Decision Making   April Shaffer is a 71 y.o. female who presents for surveillance of PAD.  S/p RLE angiogram with proximal SFA stent on 10/23/2021  Based on the patient's vascular studies, her right ABI has markedly improved from her last visit.  It was 0.85, and is now 1.05.  Her left ABI is essentially unchanged at 1.04 Her RLE arterial duplex study shows patent proximal SFA and distal SFA stenting with no signs of stenosis.  She does have increased mid popliteal artery velocities up to 240, suggestive of 50 to 74% stenosis. She has not been experiencing any claudication, rest pain, tissue loss and either lower extremity.  She can continue her aspirin, plavix, statin.  She can follow-up with [place] in 1 month for bilateral carotid artery duplex study, since she is due for her 32-monthfollow-up.  She can also follow-up with [place] 6 months for repeat ABIs and RLE arterial duplex study She  understands to call sooner if she has any worsening symptoms   Vicente Serene PA-C [facility] Office: [facility] MD: Donzetta Matters

## 2021-12-22 ENCOUNTER — Inpatient Hospital Stay: Payer: Medicare HMO

## 2021-12-22 ENCOUNTER — Other Ambulatory Visit: Payer: Self-pay

## 2021-12-22 VITALS — BP 114/57 | HR 62 | Temp 98.0°F | Resp 18

## 2021-12-22 DIAGNOSIS — E538 Deficiency of other specified B group vitamins: Secondary | ICD-10-CM

## 2021-12-22 MED ORDER — SODIUM CHLORIDE 0.9 % IV SOLN
300.0000 mg | Freq: Once | INTRAVENOUS | Status: AC
  Administered 2021-12-22: 300 mg via INTRAVENOUS
  Filled 2021-12-22: qty 300

## 2021-12-22 MED ORDER — SODIUM CHLORIDE 0.9 % IV SOLN: Freq: Once | INTRAVENOUS | Status: AC

## 2021-12-22 MED ORDER — LORATADINE 10 MG PO TABS
10.0000 mg | ORAL_TABLET | Freq: Every day | ORAL | Status: DC
  Administered 2021-12-22: 10 mg via ORAL
  Filled 2021-12-22: qty 1

## 2021-12-22 NOTE — Patient Instructions (Signed)

## 2021-12-29 ENCOUNTER — Other Ambulatory Visit: Payer: Self-pay

## 2021-12-29 ENCOUNTER — Inpatient Hospital Stay: Payer: Medicare HMO | Attending: Nurse Practitioner

## 2021-12-29 VITALS — BP 116/65 | HR 62 | Temp 98.1°F | Resp 18

## 2021-12-29 DIAGNOSIS — Z90710 Acquired absence of both cervix and uterus: Secondary | ICD-10-CM | POA: Diagnosis not present

## 2021-12-29 DIAGNOSIS — Z7984 Long term (current) use of oral hypoglycemic drugs: Secondary | ICD-10-CM | POA: Insufficient documentation

## 2021-12-29 DIAGNOSIS — D508 Other iron deficiency anemias: Secondary | ICD-10-CM | POA: Diagnosis not present

## 2021-12-29 DIAGNOSIS — I2510 Atherosclerotic heart disease of native coronary artery without angina pectoris: Secondary | ICD-10-CM | POA: Insufficient documentation

## 2021-12-29 DIAGNOSIS — Z90722 Acquired absence of ovaries, bilateral: Secondary | ICD-10-CM | POA: Diagnosis not present

## 2021-12-29 DIAGNOSIS — I10 Essential (primary) hypertension: Secondary | ICD-10-CM | POA: Insufficient documentation

## 2021-12-29 DIAGNOSIS — E785 Hyperlipidemia, unspecified: Secondary | ICD-10-CM | POA: Diagnosis not present

## 2021-12-29 DIAGNOSIS — M1990 Unspecified osteoarthritis, unspecified site: Secondary | ICD-10-CM | POA: Insufficient documentation

## 2021-12-29 DIAGNOSIS — Z9079 Acquired absence of other genital organ(s): Secondary | ICD-10-CM | POA: Diagnosis not present

## 2021-12-29 DIAGNOSIS — Z79899 Other long term (current) drug therapy: Secondary | ICD-10-CM | POA: Diagnosis not present

## 2021-12-29 DIAGNOSIS — E538 Deficiency of other specified B group vitamins: Secondary | ICD-10-CM | POA: Insufficient documentation

## 2021-12-29 DIAGNOSIS — E119 Type 2 diabetes mellitus without complications: Secondary | ICD-10-CM | POA: Diagnosis not present

## 2021-12-29 DIAGNOSIS — Z7902 Long term (current) use of antithrombotics/antiplatelets: Secondary | ICD-10-CM | POA: Diagnosis not present

## 2021-12-29 MED ORDER — SODIUM CHLORIDE 0.9 % IV SOLN
300.0000 mg | Freq: Once | INTRAVENOUS | Status: AC
  Administered 2021-12-29: 300 mg via INTRAVENOUS
  Filled 2021-12-29: qty 300

## 2021-12-29 MED ORDER — SODIUM CHLORIDE 0.9 % IV SOLN: Freq: Once | INTRAVENOUS | Status: AC

## 2021-12-29 MED ORDER — LORATADINE 10 MG PO TABS
10.0000 mg | ORAL_TABLET | Freq: Every day | ORAL | Status: DC
  Administered 2021-12-29: 10 mg via ORAL
  Filled 2021-12-29: qty 1

## 2021-12-29 NOTE — Patient Instructions (Signed)

## 2022-01-05 ENCOUNTER — Inpatient Hospital Stay: Payer: Medicare HMO

## 2022-01-05 ENCOUNTER — Other Ambulatory Visit: Payer: Self-pay

## 2022-01-05 VITALS — BP 123/61 | HR 73 | Temp 97.8°F | Resp 17

## 2022-01-05 DIAGNOSIS — D508 Other iron deficiency anemias: Secondary | ICD-10-CM | POA: Diagnosis not present

## 2022-01-05 DIAGNOSIS — E538 Deficiency of other specified B group vitamins: Secondary | ICD-10-CM

## 2022-01-05 MED ORDER — LORATADINE 10 MG PO TABS
10.0000 mg | ORAL_TABLET | Freq: Every day | ORAL | Status: DC
  Administered 2022-01-05: 10 mg via ORAL
  Filled 2022-01-05: qty 1

## 2022-01-05 MED ORDER — SODIUM CHLORIDE 0.9 % IV SOLN: Freq: Once | INTRAVENOUS | Status: AC

## 2022-01-05 MED ORDER — SODIUM CHLORIDE 0.9 % IV SOLN
300.0000 mg | Freq: Once | INTRAVENOUS | Status: AC
  Administered 2022-01-05: 300 mg via INTRAVENOUS
  Filled 2022-01-05: qty 300

## 2022-01-05 NOTE — Patient Instructions (Signed)

## 2022-01-08 ENCOUNTER — Other Ambulatory Visit: Payer: Self-pay

## 2022-01-08 ENCOUNTER — Encounter: Payer: Self-pay | Admitting: Hematology

## 2022-01-08 ENCOUNTER — Inpatient Hospital Stay: Payer: Medicare HMO

## 2022-01-08 ENCOUNTER — Inpatient Hospital Stay (HOSPITAL_BASED_OUTPATIENT_CLINIC_OR_DEPARTMENT_OTHER): Payer: Medicare HMO | Admitting: Hematology

## 2022-01-08 VITALS — BP 160/71 | HR 82 | Temp 98.0°F | Resp 16 | Ht 63.0 in | Wt 137.9 lb

## 2022-01-08 DIAGNOSIS — D649 Anemia, unspecified: Secondary | ICD-10-CM

## 2022-01-08 DIAGNOSIS — D500 Iron deficiency anemia secondary to blood loss (chronic): Secondary | ICD-10-CM | POA: Diagnosis not present

## 2022-01-08 DIAGNOSIS — E538 Deficiency of other specified B group vitamins: Secondary | ICD-10-CM

## 2022-01-08 DIAGNOSIS — D508 Other iron deficiency anemias: Secondary | ICD-10-CM | POA: Diagnosis not present

## 2022-01-08 DIAGNOSIS — D509 Iron deficiency anemia, unspecified: Secondary | ICD-10-CM | POA: Insufficient documentation

## 2022-01-08 LAB — CBC WITH DIFFERENTIAL (CANCER CENTER ONLY)
Abs Immature Granulocytes: 0.02 10*3/uL (ref 0.00–0.07)
Basophils Absolute: 0 10*3/uL (ref 0.0–0.1)
Basophils Relative: 1 %
Eosinophils Absolute: 0.3 10*3/uL (ref 0.0–0.5)
Eosinophils Relative: 4 %
HCT: 31.2 % — ABNORMAL LOW (ref 36.0–46.0)
Hemoglobin: 9.8 g/dL — ABNORMAL LOW (ref 12.0–15.0)
Immature Granulocytes: 0 %
Lymphocytes Relative: 22 %
Lymphs Abs: 1.9 10*3/uL (ref 0.7–4.0)
MCH: 24.4 pg — ABNORMAL LOW (ref 26.0–34.0)
MCHC: 31.4 g/dL (ref 30.0–36.0)
MCV: 77.8 fL — ABNORMAL LOW (ref 80.0–100.0)
Monocytes Absolute: 0.7 10*3/uL (ref 0.1–1.0)
Monocytes Relative: 9 %
Neutro Abs: 5.4 10*3/uL (ref 1.7–7.7)
Neutrophils Relative %: 64 %
Platelet Count: 361 10*3/uL (ref 150–400)
RBC: 4.01 MIL/uL (ref 3.87–5.11)
RDW: 23 % — ABNORMAL HIGH (ref 11.5–15.5)
WBC Count: 8.3 10*3/uL (ref 4.0–10.5)
nRBC: 0 % (ref 0.0–0.2)

## 2022-01-08 LAB — VITAMIN B12: Vitamin B-12: 391 pg/mL (ref 180–914)

## 2022-01-08 MED ORDER — CYANOCOBALAMIN 1000 MCG/ML IJ SOLN
1000.0000 ug | Freq: Once | INTRAMUSCULAR | Status: AC
  Administered 2022-01-08: 1000 ug via INTRAMUSCULAR
  Filled 2022-01-08: qty 1

## 2022-01-08 NOTE — Progress Notes (Addendum)
[place]   [phone] [phone]   Clinic Follow up Note   Patient Care Team: Sonia Side., FNP as PCP - General (Family Medicine) Lorretta Harp, MD as PCP - Cardiology (Cardiology) Earlie Server, MD as Consulting Physician (Orthopedic Surgery) Truitt Merle, MD as Consulting Physician (Hematology) Alla Feeling, NP as Nurse Practitioner (Nurse Practitioner) Sonia Side., FNP as Nurse Practitioner (Family Medicine) Eustace Moore, MD as Consulting Physician (Neurosurgery)  Date of Service:  01/08/2022  CHIEF COMPLAINT: f/u of anemia  CURRENT THERAPY:  B12 injections, starting 08/31/21, currently monthly Iv venofer as needed   ASSESSMENT & PLAN:  April Shaffer is a 71 y.o. female with   1. Anemia, B12 deficiency, iron deficiency and anemia of chronic disease  -h/o mild intermittent anemia with hgb 9-11 range since at least 2011.  -iron panel 08/01/21 indicative of anemia of chronic disease rather than true iron deficiency -labs from 08/21/21 showed Hgb 8.8, no other cytopenias or evidence of hemolysis, CKD, normal ferritin, MM panel and light chains are abnormal but nonspecific, no concern for multiple myeloma. She has low B12 and elevated MMA which confirms B12 deficiency.   -intrinsic factor from 08/31/21 was normal  -she began B12 injections on 08/31/21, weekly through 10/11/21 and now on monthly. -iron on 12/06/21 was 19. She received 3 doses IV Venofer from 12/22/21. -labs reviewed, hgb stable at 9.8. Will proceed with B12 injection today. -I reviewed her lab results and trends with her today. I explained that her anemia has not significantly improved despite receiving B12 injections and IV iron, which indicated she likely has anemia of chronic disease. We discussed the role of bone marrow biopsy to rule out MDS, which is a predecessor to leukemia, although my suspicion is not very high. If her anemia worsens to the point that she  requires ESA, I will strongly encourage her to proceed with BMB before we start her on ESA. At this point, I feel okay to just monitor her. I encouraged her to continue oral iron and add oral B12, then we will recheck her labs in 6 weeks.   2. Co-morbidities: HTN, HL, DM, CAD, carotid artery stenosis/occlusion, OA, multiple back surgeries and post op infection -continue PCP, ortho, neurosurgery, cardiology, and vascular surgery f/up      Plan: -proceed with B12 injection today then switch to oral B12 1029mg daily  -lab in 6 weeks -lab and f/u in 3 months   No problem-specific Assessment & Plan notes found for this encounter.   INTERVAL HISTORY:  April MCLEROYis here for a follow up of anemia. She was last seen by me on 12/06/21. She presents to the clinic alone. She reports she is feeling well overall. She notes she felt better after iron infusion and was able to do some chores around the house.   All other systems were reviewed with the patient and are negative.  MEDICAL HISTORY:  Past Medical History:  Diagnosis Date   Anemia    Arthritis    Blood transfusion without reported diagnosis    Carotid artery narrowing    Coronary artery disease    Cardiac catheterization November 2013: 50% ostial LAD stenosis 50% mid stenosis. 30% disease in the left circumflex.   [place]    Hyperlipidemia    Hypertension    Onychomycosis of toenail 07/31/2016   Sleep apnea       Had surgery to correct  SURGICAL HISTORY: Past Surgical History:  Procedure Laterality Date   ABDOMINAL AORTOGRAM W/LOWER EXTREMITY N/A 08/07/2021   Procedure: ABDOMINAL AORTOGRAM W/LOWER EXTREMITY;  Surgeon: Waynetta Sandy, MD;  Location: [facility];  Service: Cardiovascular;  Laterality: N/A;   ABDOMINAL AORTOGRAM W/LOWER EXTREMITY Right 10/23/2021   Procedure: ABDOMINAL AORTOGRAM W/LOWER EXTREMITY;  Surgeon: Waynetta Sandy, MD;  Location: [facility];  Service: Cardiovascular;  Laterality: Right;   ABDOMINAL HYSTERECTOMY     APPLICATION OF INTRAOPERATIVE CT SCAN N/A 11/30/2020   Procedure: APPLICATION OF INTRAOPERATIVE CT SCAN;  Surgeon: Eustace Moore, MD;  Location: [place] [place];  Service: Neurosurgery;  Laterality: N/A;   BACK SURGERY     CARDIAC CATHETERIZATION     CATARACT EXTRACTION, BILATERAL Bilateral 2021   Dr. Sherral Hammers   ENDARTERECTOMY  09/27/2011   Procedure: RIGT ENDARTERECTOMY CAROTID;  Surgeon: Mal Misty, MD;  Location: [place];  Service: Vascular;  Laterality: Right;   EPIDURAL BLOCK INJECTION  02/2008   Drs. Eulis Manly   gyn surgery  2004   total hysterectomy for mennorhagia,,salpingoophorectomy   LAMINECTOMY WITH POSTERIOR LATERAL ARTHRODESIS LEVEL 2 N/A 11/30/2020   Procedure: LUMBAR FOUR-FIVE, LUMBAR FIVE-SACRAL ONE POSTERIOR LATERAL FUSION WITH REVISION OF LUMBAR ONE-FIVE HARDWARE AND EXTENSION TO SACRAL ONE AND SACRAL TWO;  Surgeon: Eustace Moore, MD;  Location: [facility];  Service: Neurosurgery;  Laterality: N/A;   LUMBAR WOUND DEBRIDEMENT N/A 05/25/2020   Procedure: LUMBAR WOUND IRRIGATION AND DEBRIDEMENT;  Surgeon: Eustace Moore, MD;  Location: [place];  Service: Neurosurgery;  Laterality: N/A;   PERIPHERAL VASCULAR INTERVENTION  08/07/2021   Procedure: PERIPHERAL VASCULAR INTERVENTION;  Surgeon: Waynetta Sandy, MD;  Location: [facility];  Service: Cardiovascular;;  Rt SFA   PERIPHERAL VASCULAR INTERVENTION Right 10/23/2021   Procedure: PERIPHERAL VASCULAR INTERVENTION;  Surgeon: Waynetta Sandy, MD;  Location: [place] [facility];  Service: Cardiovascular;  Laterality: Right;  SFA   POSTERIOR LUMBAR FUSION 4 LEVEL N/A 04/25/2020   Procedure: POSTERIOR LUMBAR INTERBODY FUSION LUMBAR ONE-TWO, LUMBAR TWO-THREE, LUMBAR THREE-FOUR,LUMBAR FOUR-FIVE.;  Surgeon: Eustace Moore, MD;  Location: [place];  Service: Neurosurgery;  Laterality: N/A;  posterior   TONSILLECTOMY     TOTAL ABDOMINAL  HYSTERECTOMY W/ BILATERAL SALPINGOOPHORECTOMY Bilateral 2000   TOTAL HIP ARTHROPLASTY Right 08/22/2018   Procedure: TOTAL HIP ARTHROPLASTY;  Surgeon: Earlie Server, MD;  Location: [facility];  Service: Orthopedics;  Laterality: Right;   VARICOSE VEIN SURGERY  2008   stripping    I have reviewed the social history and family history with the patient and they are unchanged from previous note.  ALLERGIES:  is allergic to zestoretic [lisinopril-hydrochlorothiazide], codeine, oxycodone, ultram [tramadol], and zocor [simvastatin].  MEDICATIONS:  Current Outpatient Medications  Medication Sig Dispense Refill   allopurinol (ZYLOPRIM) 300 MG tablet Take 1 tablet (300 mg total) by mouth daily. 90 tablet 3   amLODipine (NORVASC) 10 MG tablet Take 10 mg by mouth in the morning.     aspirin EC 81 MG tablet Take 81 mg by mouth in the morning.     atorvastatin (LIPITOR) 80 MG tablet TAKE 1 TABLET BY MOUTH ONCE DAILY WITH  EVENING  MEAL (Patient taking differently: Take 80 mg by mouth in the morning.) 90 tablet 3   blood glucose meter kit and supplies KIT Check sugars twice daily 1 each 0   carvedilol (COREG CR) 20 MG 24 hr capsule Take 1 capsule (20 mg total) by mouth daily.  90 capsule 3   clopidogrel (PLAVIX) 75 MG tablet Take 75 mg by mouth in the morning.     Cyanocobalamin (VITAMIN B-12 IJ) Inject as directed every 30 (thirty) days.     ezetimibe (ZETIA) 10 MG tablet TAKE 1 TABLET EVERY DAY (NEED MD APPOINTMENT) 90 tablet 0   ferrous sulfate 325 (65 FE) MG tablet Take 325 mg by mouth in the morning and at bedtime.     gabapentin (NEURONTIN) 300 MG capsule Take 300 mg by mouth 3 (three) times daily.     glucose blood test strip Check sugars twice daily 100 each 11   metFORMIN (GLUCOPHAGE) 500 MG tablet Take 500 mg by mouth 2 (two) times daily with a meal.     Oxycodone HCl 10 MG TABS Take 10 mg by mouth every 6 (six) hours as needed (Pain).     oxyCODONE-acetaminophen (PERCOCET) 10-325 MG tablet Take 1  tablet by mouth every 4 (four) hours as needed for pain. 40 tablet 0   No current facility-administered medications for this visit.    PHYSICAL EXAMINATION: ECOG PERFORMANCE STATUS: 1 - Symptomatic but completely ambulatory  Vitals:   01/08/22 1029  BP: (!) 160/71  Pulse: 82  Resp: 16  Temp: 98 F (36.7 C)  SpO2: 100%   Wt Readings from Last 3 Encounters:  01/08/22 137 lb 14.4 oz (62.6 kg)  12/13/21 137 lb 9.6 oz (62.4 kg)  12/06/21 139 lb 6.4 oz (63.2 kg)     GENERAL:alert, no distress and comfortable SKIN: skin color normal, no rashes or significant lesions EYES: normal, Conjunctiva are pink and non-injected, sclera clear  NEURO: alert & oriented x 3 with fluent speech  LABORATORY DATA:  I have reviewed the data as listed    Latest Ref Rng & Units 01/08/2022   10:07 AM 12/06/2021   11:18 AM 10/23/2021    6:23 AM  CBC  WBC 4.0 - 10.5 K/uL 8.3  8.7    Hemoglobin 12.0 - 15.0 g/dL 9.8  9.7  10.2   Hematocrit 36.0 - 46.0 % 31.2  30.7  30.0   Platelets 150 - 400 K/uL 361  361          Latest Ref Rng & Units 10/23/2021    6:23 AM 08/21/2021   12:21 PM 08/07/2021    1:16 PM  CMP  Glucose 70 - 99 mg/dL 106  114  102   BUN 8 - 23 mg/dL _0 Creatinine 0.44 - 1.00 mg/dL 0.90  0.77  0.40   Sodium 135 - 145 mmol/L 139  137  140   Potassium 3.5 - 5.1 mmol/L 3.7  4.6  4.6   Chloride 98 - 111 mmol/L 102  104  103   CO2 22 - 32 mmol/L  25    Calcium 8.9 - 10.3 mg/dL  9.0    Total Protein 6.5 - 8.1 g/dL  8.3    Total Bilirubin 0.3 - 1.2 mg/dL  0.5    Alkaline Phos 38 - 126 U/L  141    AST 15 - 41 U/L  13    ALT 0 - 44 U/L  7        RADIOGRAPHIC STUDIES: I have personally reviewed the radiological images as listed and agreed with the findings in the report. No results found.    No orders of the defined types were placed in this encounter.  All questions were answered. The patient knows to call the  clinic with any problems, questions or concerns. No barriers  to learning was detected. The total time spent in the appointment was 25 minutes.     Truitt Merle, MD 01/08/2022   I, Wilburn Mylar, am acting as scribe for Truitt Merle, MD.   I have reviewed the above documentation for accuracy and completeness, and I agree with the above.

## 2022-01-19 ENCOUNTER — Other Ambulatory Visit: Payer: Self-pay

## 2022-01-19 ENCOUNTER — Emergency Department (HOSPITAL_BASED_OUTPATIENT_CLINIC_OR_DEPARTMENT_OTHER): Payer: Medicare HMO | Admitting: Radiology

## 2022-01-19 ENCOUNTER — Encounter (HOSPITAL_BASED_OUTPATIENT_CLINIC_OR_DEPARTMENT_OTHER): Payer: Self-pay

## 2022-01-19 ENCOUNTER — Emergency Department (HOSPITAL_BASED_OUTPATIENT_CLINIC_OR_DEPARTMENT_OTHER)
Admission: EM | Admit: 2022-01-19 | Discharge: 2022-01-19 | Disposition: A | Discharge status: Home / Self Care | Payer: Medicare HMO | Attending: Emergency Medicine | Admitting: Emergency Medicine

## 2022-01-19 ENCOUNTER — Emergency Department (HOSPITAL_BASED_OUTPATIENT_CLINIC_OR_DEPARTMENT_OTHER): Payer: Medicare HMO

## 2022-01-19 DIAGNOSIS — E119 Type 2 diabetes mellitus without complications: Secondary | ICD-10-CM | POA: Diagnosis not present

## 2022-01-19 DIAGNOSIS — M5441 Lumbago with sciatica, right side: Secondary | ICD-10-CM | POA: Insufficient documentation

## 2022-01-19 DIAGNOSIS — Z7984 Long term (current) use of oral hypoglycemic drugs: Secondary | ICD-10-CM | POA: Insufficient documentation

## 2022-01-19 DIAGNOSIS — I2510 Atherosclerotic heart disease of native coronary artery without angina pectoris: Secondary | ICD-10-CM | POA: Insufficient documentation

## 2022-01-19 DIAGNOSIS — M79604 Pain in right leg: Secondary | ICD-10-CM | POA: Diagnosis present

## 2022-01-19 DIAGNOSIS — Z7982 Long term (current) use of aspirin: Secondary | ICD-10-CM | POA: Insufficient documentation

## 2022-01-19 LAB — URINALYSIS, ROUTINE W REFLEX MICROSCOPIC
Bilirubin Urine: NEGATIVE
Glucose, UA: NEGATIVE mg/dL
Hgb urine dipstick: NEGATIVE
Ketones, ur: NEGATIVE mg/dL
Leukocytes,Ua: NEGATIVE
Nitrite: NEGATIVE
Protein, ur: 30 mg/dL — AB
Specific Gravity, Urine: 1.017 (ref 1.005–1.030)
pH: 6.5 (ref 5.0–8.0)

## 2022-01-19 MED ORDER — HYDROMORPHONE HCL 1 MG/ML IJ SOLN
1.0000 mg | Freq: Once | INTRAMUSCULAR | Status: AC
  Administered 2022-01-19: 1 mg via INTRAMUSCULAR
  Filled 2022-01-19: qty 1

## 2022-01-19 MED ORDER — PREDNISONE 10 MG PO TABS: 40.0000 mg | ORAL_TABLET | Freq: Every day | ORAL | 0 refills | Status: DC

## 2022-01-19 NOTE — Discharge Instructions (Addendum)
You were seen today in the emergency department for back pain/hip pain going down your right leg.  Your workup today was reassuring, I think the pain is most likely sciatic nerve pain.  I will continue following up with your back doctor.  Continue taking your home medications.  Start taking the steroids 40 mg a day for 5 days.  This should help reduce inflammation.  Make sure you are checking your sugar each day, if your sugar gets elevated he should stop the medication.  Return to the ED for new or concerning symptoms.  Follow-up with your main doctor next week for reevaluation

## 2022-01-19 NOTE — ED Triage Notes (Signed)
Pt states had dip surgery and is having right side hip pain. Taking oxycodone for pain. Walks with assisted device.

## 2022-01-19 NOTE — ED Notes (Signed)
Pt aware of the need for urine. Unable to currently. 

## 2022-01-19 NOTE — ED Provider Notes (Signed)
[facility] EMERGENCY DEPT Provider Note   CSN: [id number] Arrival date & time: 01/19/22  1158     History  Chief Complaint  Patient presents with   Hip Pain    Right side    April Shaffer is a 71 y.o. female.   Hip Pain     Patient with medical history of diabetes, arthritis, 3 previous low back surgeries, CAD, neuropathy, PAD status post stent placement 1 month ago and right lower extremity presents today due to right lower extremity pain and weakness.  Started atraumatically 2 days ago, it is worse with movement but constant. Starts in low back, goes down hip and posteriorly down leg to toes.   It is worse when she raises her leg.  She does feel slightly weaker on the right lower side but not to the upper body.  Denies any dysuria, denies saddle anesthesia, urinary tension, fecal incontinence, bilateral lower extremity weakness or numbness.  No history of malignancies.   Home Medications Prior to Admission medications   Medication Sig Start Date End Date Taking? Authorizing Provider  predniSONE (DELTASONE) 10 MG tablet Take 4 tablets (40 mg total) by mouth daily. 01/19/22  Yes Sage, Haley, PA-C  allopurinol (ZYLOPRIM) 300 MG tablet Take 1 tablet (300 mg total) by mouth daily. 07/09/19   Mulberry, Elizabeth, MD  amLODipine (NORVASC) 10 MG tablet Take 10 mg by mouth in the morning.    [provider]  aspirin EC 81 MG tablet Take 81 mg by mouth in the morning.    [provider]  atorvastatin (LIPITOR) 80 MG tablet TAKE 1 TABLET BY MOUTH ONCE DAILY WITH  EVENING  MEAL Patient taking differently: Take 80 mg by mouth in the morning. 07/09/19   Mulberry, Elizabeth, MD  blood glucose meter kit and supplies KIT Check sugars twice daily 06/10/18   Mulberry, Elizabeth, MD  carvedilol (COREG CR) 20 MG 24 hr capsule Take 1 capsule (20 mg total) by mouth daily. 07/09/19   Mulberry, Elizabeth, MD  clopidogrel (PLAVIX) 75 MG tablet Take 75 mg by mouth in the  morning.    [provider]  Cyanocobalamin (VITAMIN B-12 IJ) Inject as directed every 30 (thirty) days.    [provider]  ezetimibe (ZETIA) 10 MG tablet TAKE 1 TABLET EVERY DAY (NEED MD APPOINTMENT) 09/27/20   Berry, Jonathan J, MD  ferrous sulfate 325 (65 FE) MG tablet Take 325 mg by mouth in the morning and at bedtime.    [provider]  gabapentin (NEURONTIN) 300 MG capsule Take 300 mg by mouth 3 (three) times daily. 06/09/21   [provider]  glucose blood test strip Check sugars twice daily 01/31/16   Mulberry, Elizabeth, MD  metFORMIN (GLUCOPHAGE) 500 MG tablet Take 500 mg by mouth 2 (two) times daily with a meal. 04/08/20   [provider]  Oxycodone HCl 10 MG TABS Take 10 mg by mouth every 6 (six) hours as needed (Pain). 10/09/21   [provider]  oxyCODONE-acetaminophen (PERCOCET) 10-325 MG tablet Take 1 tablet by mouth every 4 (four) hours as needed for pain. 12/03/20   Jones, David S, MD      Allergies    Zestoretic [lisinopril-hydrochlorothiazide], Codeine, Oxycodone, Ultram [tramadol], and Zocor [simvastatin]    Review of Systems   Review of Systems  Physical Exam Updated Vital Signs BP (!) 143/67   Pulse 90   Temp 98.1 F (36.7 C) (Oral)   Resp 18   Ht 5' 3" (  1.6 m)   Wt 62.6 kg   SpO2 94%   BMI 24.45 kg/m  Physical Exam Vitals and nursing note reviewed. Exam conducted with a chaperone present.  Constitutional:      Appearance: Normal appearance.  HENT:     Head: Normocephalic and atraumatic.  Eyes:     General: No scleral icterus.       Right eye: No discharge.        Left eye: No discharge.     Extraocular Movements: Extraocular movements intact.     Pupils: Pupils are equal, round, and reactive to light.  Cardiovascular:     Rate and Rhythm: Normal rate and regular rhythm.     Pulses: Normal pulses.     Heart sounds: Normal heart sounds. No murmur heard.    No friction rub. No gallop.     Comments:  Palpable pedal pulses bilaterally Pulmonary:     Effort: Pulmonary effort is normal. No respiratory distress.     Breath sounds: Normal breath sounds.  Abdominal:     General: Abdomen is flat. Bowel sounds are normal. There is no distension.     Palpations: Abdomen is soft.     Tenderness: There is no abdominal tenderness.  Musculoskeletal:        General: Tenderness present.  Skin:    General: Skin is warm and dry.     Capillary Refill: Capillary refill takes less than 2 seconds.     Coloration: Skin is not jaundiced.     Comments: No rashes or shingles  Neurological:     Mental Status: She is alert. Mental status is at baseline.     Motor: Weakness present.     Coordination: Coordination normal.     Comments: Cranial nerves II through XII are grossly intact.  Bilateral upper extremity strength 5/5.  Right lower extremity is slightly decreased strength at hip with abduction and abduction.  Left lower extremity strength symmetric.  Plantarflexion dorsiflexion is roughly symmetric bilaterally.  Sensation light touch is grossly intact.     ED Results / Procedures / Treatments   Labs (all labs ordered are listed, but only abnormal results are displayed) Labs Reviewed  URINALYSIS, ROUTINE W REFLEX MICROSCOPIC - Abnormal; Notable for the following components:      Result Value   APPearance HAZY (*)    Protein, ur 30 (*)    All other components within normal limits    EKG None  Radiology MR LUMBAR SPINE WO CONTRAST  Result Date: 01/19/2022 CLINICAL DATA:  Low back pain, cauda equina syndrome suspected EXAM: MRI LUMBAR SPINE WITHOUT CONTRAST TECHNIQUE: Multiplanar, multisequence MR imaging of the lumbar spine was performed. No intravenous contrast was administered. COMPARISON:  10/23/2020 MRI lumbar spine FINDINGS: Segmentation:  5 lumbar type vertebral bodies. Alignment: Straightening of the normal lumbar lordosis. 3 mm fixed retrolisthesis of L1 on L2. 5 mm fixed anterolisthesis  of L3 on L4. 7 mm fixed anterolisthesis of L4 on L5. Vertebrae: No acute fracture or suspicious osseous lesion. Status post fusion L1-S2, previously L1-L5. evaluation is somewhat limited by susceptibility artifact from the hardware. Conus medullaris and cauda equina: Conus extends to the T12-L1 level. Conus and cauda equina appear normal. Paraspinal and other soft tissues: Fatty atrophy of the bilateral psoas and posterior paraspinous muscles, possibly due to denervation. Multiple renal cysts, for which no follow-up is currently indicated. Disc levels: Evaluation is limited by susceptibility artifact from hardware, which particularly limits evaluation of the neural foramina. T12-L1: No  significant disc bulge. No spinal canal stenosis or neural foraminal narrowing. L1-L2: Status post fusion. No spinal canal stenosis or neural foraminal narrowing. L2-L3: Status post fusion. No spinal canal stenosis or neural foraminal narrowing. L3-L4: Status post fusion. Moderate spinal canal stenosis, unchanged. No neural foraminal narrowing. L4-L5: Grade 1 anterolisthesis. Status post fusion. Moderate to severe spinal canal stenosis, unchanged. No neural foraminal narrowing. L5-S1: No significant disc bulge. Status post posterior fusion. No spinal canal stenosis. Mild bilateral foraminal narrowing, unchanged. IMPRESSION: 1. Interval extension of previously noted fusion, which now extends from L1-S2. Evaluation of these levels is somewhat limited by susceptibility artifact from the hardware. 2. L4-L5 moderate to severe spinal canal stenosis, unchanged. 3. L3-L4 moderate spinal canal stenosis, unchanged. 4. L5-S1 mild bilateral neural foraminal narrowing, unchanged. Electronically Signed   By: Alison  Vasan M.D.   On: 01/19/2022 19:54   DG Hip Unilat  With Pelvis 2-3 Views Right  Result Date: 01/19/2022 CLINICAL DATA:  Hip pain. EXAM: DG HIP (WITH OR WITHOUT PELVIS) 2-3V RIGHT COMPARISON:  Radiographs dated January 25, 2020  FINDINGS: Status post right hip arthroplasty. No periprosthetic fracture. Mild lucency about the medial aspect of the femoral component measuring up to 5 mm, unchanged from prior examination of January 25, 2000. Vascular stent in the proximal femoral vessels. Bilateral sacroiliac joint screws as well as partially imaged lumbar spine hardware. Advanced left hip osteoarthritis with joint space narrowing and bulky marginal osteophytes. IMPRESSION: 1. Status post right hip arthroplasty without evidence of periprosthetic fracture. 2. Advanced left hip osteoarthritis. Electronically Signed   By: Imran  Ahmed D.O.   On: 01/19/2022 13:03    Procedures Procedures    Medications Ordered in ED Medications  HYDROmorphone (DILAUDID) injection 1 mg (1 mg Intramuscular Given 01/19/22 1746)    ED Course/ Medical Decision Making/ A&P                           Medical Decision Making Amount and/or Complexity of Data Reviewed Labs: ordered. Radiology: ordered.  Risk Prescription drug management.   This is a 71-year-old female presenting today due to low back pain with radiation on the right lower extremity.  Differential includes but limited to sciatic pain, and chronic low back pain, compression fracture, cauda equina, epidural abscess.  I reviewed external medical records, patient recently had stent placed in the right lower extremity.  She has pulses, good refill in the lower extremity is warm and perfused.  I do not think this is an ischemic limb.  Given there is some mild weakness to the right lower extremity we will proceed with lumbar spine given extensive back history and surgical history.  Also check urine to make sure patient is able to void.  I viewed imaging studies.  Plain film of hip is negative for acute fractures, she does have OA and status post hip placement.  MRI lumbar spine with prior operations, stenosis but no acute or new findings that would be consistent with etiology of her  pain.  She does not have any red flag symptoms that be really suggestive of cauda equina.  She is afebrile, do not think this is SIRS.  Urine is without any signs of UTI and she is voiding.  She is also able to ambulate.  I considered additional workup but I feel patient is appropriate for close outpatient follow-up.  She does have a history of diabetes, she is not on insulin but is able to check   her sugar at home.  She is already on gabapentin, Percocet and Lidoderm patches at baseline.  I discussed risks of steroid pack with her including DKA and need for close monitoring of glucose but I think it is reasonable to try steroid burst to see if that improves her symptoms.  Return precautions were discussed with the patient who verbalized understanding and agreement the plan.  Discussed HPI, physical exam and plan of care for this patient with attending Stephen Rancour. The attending physician evaluated this patient as part of a shared visit and agrees with plan of care.         Final Clinical Impression(s) / ED Diagnoses Final diagnoses:  Acute midline low back pain with right-sided sciatica    Rx / DC Orders ED Discharge Orders          Ordered    predniSONE (DELTASONE) 10 MG tablet  Daily        01/19/22 2059              Sage, Haley, PA-C 01/19/22 2200    Rancour, Stephen, MD 01/19/22 2349  

## 2022-01-24 ENCOUNTER — Encounter: Payer: Self-pay | Admitting: Podiatry

## 2022-01-24 ENCOUNTER — Ambulatory Visit (INDEPENDENT_AMBULATORY_CARE_PROVIDER_SITE_OTHER): Payer: Medicare HMO | Admitting: Podiatry

## 2022-01-24 ENCOUNTER — Ambulatory Visit (HOSPITAL_COMMUNITY)
Admission: RE | Admit: 2022-01-24 | Discharge: 2022-01-24 | Disposition: A | Discharge status: Home / Self Care | Payer: Medicare HMO | Source: Ambulatory Visit | Attending: Vascular Surgery | Admitting: Vascular Surgery

## 2022-01-24 ENCOUNTER — Ambulatory Visit (INDEPENDENT_AMBULATORY_CARE_PROVIDER_SITE_OTHER): Payer: Medicare HMO | Admitting: Physician Assistant

## 2022-01-24 VITALS — BP 142/73 | HR 67 | Temp 98.2°F | Ht 63.0 in | Wt 137.0 lb

## 2022-01-24 DIAGNOSIS — E119 Type 2 diabetes mellitus without complications: Secondary | ICD-10-CM

## 2022-01-24 DIAGNOSIS — I739 Peripheral vascular disease, unspecified: Secondary | ICD-10-CM

## 2022-01-24 DIAGNOSIS — I6521 Occlusion and stenosis of right carotid artery: Secondary | ICD-10-CM | POA: Diagnosis present

## 2022-01-24 DIAGNOSIS — B351 Tinea unguium: Secondary | ICD-10-CM

## 2022-01-24 DIAGNOSIS — I8393 Asymptomatic varicose veins of bilateral lower extremities: Secondary | ICD-10-CM

## 2022-01-24 DIAGNOSIS — I6522 Occlusion and stenosis of left carotid artery: Secondary | ICD-10-CM | POA: Diagnosis not present

## 2022-01-24 DIAGNOSIS — M79675 Pain in left toe(s): Secondary | ICD-10-CM | POA: Diagnosis not present

## 2022-01-24 DIAGNOSIS — M79674 Pain in right toe(s): Secondary | ICD-10-CM

## 2022-01-24 DIAGNOSIS — L608 Other nail disorders: Secondary | ICD-10-CM

## 2022-01-24 DIAGNOSIS — E1142 Type 2 diabetes mellitus with diabetic polyneuropathy: Secondary | ICD-10-CM | POA: Diagnosis not present

## 2022-01-24 DIAGNOSIS — S32009K Unspecified fracture of unspecified lumbar vertebra, subsequent encounter for fracture with nonunion: Secondary | ICD-10-CM | POA: Insufficient documentation

## 2022-01-24 NOTE — Progress Notes (Signed)
Established Carotid Patient   History of Present Illness   April Shaffer is a 71 y.o. ([birth year]) female who presents for follow up of established carotid artery stenosis. She has a history of right CEA which has since occluded. Her last carotid duplex study on 12/29/2020 demonstrated 60 to 79% stenosis at the proximal left ICA.  She has been due for her 48-monthfollow-up since then.  At follow-up today, she denies any strokelike symptoms such as sudden changes to vision, weakness or numbness, slurred speech, or dizziness.  She is still taking her aspirin and Plavix.  She denies any other changes to her health recently.  She denies any claudication or rest pain.  Current Outpatient Medications  Medication Sig Dispense Refill   allopurinol (ZYLOPRIM) 300 MG tablet Take 1 tablet (300 mg total) by mouth daily. 90 tablet 3   amLODipine (NORVASC) 10 MG tablet Take 10 mg by mouth in the morning.     aspirin EC 81 MG tablet Take 81 mg by mouth in the morning.     atorvastatin (LIPITOR) 80 MG tablet TAKE 1 TABLET BY MOUTH ONCE DAILY WITH  EVENING  MEAL (Patient taking differently: Take 80 mg by mouth in the morning.) 90 tablet 3   blood glucose meter kit and supplies KIT Check sugars twice daily 1 each 0   carvedilol (COREG CR) 20 MG 24 hr capsule Take 1 capsule (20 mg total) by mouth daily. 90 capsule 3   clopidogrel (PLAVIX) 75 MG tablet Take 75 mg by mouth in the morning.     Cyanocobalamin (VITAMIN B-12 IJ) Inject as directed every 30 (thirty) days.     ezetimibe (ZETIA) 10 MG tablet TAKE 1 TABLET EVERY DAY (NEED MD APPOINTMENT) 90 tablet 0   ferrous sulfate 325 (65 FE) MG tablet Take 325 mg by mouth in the morning and at bedtime.     gabapentin (NEURONTIN) 300 MG capsule Take 300 mg by mouth 3 (three) times daily.     glucose blood test strip Check sugars twice daily 100 each 11   metFORMIN (GLUCOPHAGE) 500 MG tablet Take 500 mg by mouth 2 (two) times daily with a meal.     Oxycodone  HCl 10 MG TABS Take 10 mg by mouth every 6 (six) hours as needed (Pain).     oxyCODONE-acetaminophen (PERCOCET) 10-325 MG tablet Take 1 tablet by mouth every 4 (four) hours as needed for pain. 40 tablet 0   predniSONE (DELTASONE) 10 MG tablet Take 4 tablets (40 mg total) by mouth daily. 20 tablet 0   No current facility-administered medications for this visit.    REVIEW OF SYSTEMS (negative unless checked):   Cardiac:  _0  Chest pain or chest pressure? _1  Shortness of breath upon activity? _2  Shortness of breath when lying flat? _3  Irregular heart rhythm?  Vascular:  _4  Pain in calf, thigh, or hip brought on by walking? _5  Pain in feet at night that wakes you up from your sleep? _6  Blood clot in your veins? _7  Leg swelling?  Pulmonary:  _8  Oxygen at home? _9  Productive cough? _10  Wheezing?  Neurologic:  _11  Sudden weakness in arms or legs? _12  Sudden numbness in arms or legs? _13  Sudden onset of difficult speaking or slurred speech? _14  Temporary loss of vision in one eye? _15  Problems with dizziness?  Gastrointestinal:  _16  Blood in stool? _17  Vomited blood?  Genitourinary:  _18  Burning when urinating? _19  Blood in urine?  Psychiatric:  _20  Major depression  Hematologic:  _0  Bleeding problems? _1  Problems with blood clotting?  Dermatologic:  _2  Rashes or ulcers?  Constitutional:  _3  Fever or chills?  Ear/Nose/Throat:  _4  Change in hearing? _5  Nose bleeds? _6  Sore throat?  Musculoskeletal:  _7  Back pain? _8  Joint pain? _9  Muscle pain?   Physical Examination   Vitals:   01/24/22 1124 01/24/22 1126  BP: (!) 151/70 (!) 142/73  Pulse: 67   Temp: 98.2 F (36.8 C)   TempSrc: Temporal   SpO2: 100%   Weight: 137 lb (62.1 kg)   Height: _10  (1.6 m)    Body mass index is 24.27 kg/m.  General:  WDWN in NAD; vital signs documented above Gait: Not observed HENT: WNL, normocephalic Pulmonary: normal non-labored breathing , without Rales, rhonchi,   wheezing Cardiac: RRR without murmur, left carotid bruit Abdomen: soft, NT, no masses Skin: without rashes Vascular Exam/Pulses: Palpable radial pulses 2+. Extremities: Without ischemia or open wounds Musculoskeletal: no muscle wasting or atrophy  Neurologic: A&O X 3;  No focal weakness or paresthesias are detected Psychiatric:  The pt has Normal affect.  Non-Invasive Vascular Imaging   B Carotid Duplex (01/24/2022):  R ICA stenosis:  Occluded R VA:  patent and antegrade L ICA stenosis:  80-99% L VA:  patent and antegrade   Medical Decision Making   [name] is a 71 y.o. female who presents with: Asymptomatic left carotid stenosis  Based on the patient's vascular studies, her right ICA is still occluded.  Her left ICA now measures at 80 to 99% stenosis with a peak end diastolic velocity of 592.  Her prior duplex at the end of 2022 demonstrated 60 to 79% stenosis on the left side.  I discussed with the patient that she will need surgery for her left carotid soon, due to critical stenosis.  She is still asymptomatic at this time The patient prefers to think about her options at this time and rediscuss possible surgery with Dr. Donzetta Matters in January 2024 In the meantime, I have ordered a CTA of the head and neck to better visualize the patient's anatomy and level of stenosis She will continue to take her aspirin and Plavix.  She will see Dr. Donzetta Matters in January 2024    Vicente Serene PA-C [facility] Office: [facility] MD: Donzetta Matters

## 2022-01-27 NOTE — Progress Notes (Signed)
ANNUAL DIABETIC FOOT EXAM  Subjective: April Shaffer presents today for annual diabetic foot examination.  Chief Complaint  Patient presents with   Nail Problem    Diabetic foot care BS-did not check today A1C-6.? PCP-Fred Tamala Julian PCP VST- 2 months ago    Patient confirms h/o diabetes.  Patient relates 20 year h/o diabetes.  Patient denies any h/o foot wounds.  Patient has been diagnosed with neuropathy and it is managed with gabapentin.  Risk factors: diabetes, diabetic neuropathy, HTN, CAD, hyperlipidemia, current tobacco user.  April Side., FNP is patient's PCP.   Past Medical History:  Diagnosis Date   Anemia    Arthritis    Blood transfusion without reported diagnosis    Carotid artery narrowing    Coronary artery disease    Cardiac catheterization November 2013: 50% ostial LAD stenosis 50% mid stenosis. 30% disease in the left circumflex.   Diabetes mellitus, type 2 ([place])    Hyperlipidemia    Hypertension    Onychomycosis of toenail 07/31/2016   Sleep apnea       Had surgery to correct   Patient Active Problem List   Diagnosis Date Noted   Lumbar pseudoarthrosis 01/24/2022   Occlusion and stenosis of right carotid artery 01/24/2022   Iron deficiency anemia secondary to blood loss (chronic) 01/08/2022   B12 deficiency 08/31/2021   Medication monitoring encounter 12/12/2020   Nausea 12/12/2020   Hammer toes of both feet 11/09/2020   Acute maxillary sinusitis 08/08/2020   Hyperlipidemia 08/08/2020   Pain due to onychomycosis of toenails of both feet 08/08/2020   Therapeutic drug monitoring 06/22/2020   Wound infection after surgery 05/25/2020   Wound drainage 05/24/2020   Local infection of the skin and subcutaneous tissue, unspecified 05/24/2020   Body mass index (BMI) 27.0-27.9, adult 05/10/2020   S/P lumbar fusion 04/25/2020   Microalbuminuria due to type 2 diabetes mellitus ([place]) 08/07/2019   Primary localized osteoarthritis of right hip  08/22/2018   Carpal tunnel syndrome, bilateral 08/06/2018   Type 2 diabetes mellitus with peripheral neuropathy ([place]) 08/06/2018   Degenerative joint disease of right hip 08/06/2018   Preoperative clearance 03/26/2018   Upper back pain on left side 10/30/2016   Encounter for postoperative carotid endarterectomy surveillance 12/07/2015   Varicosities of leg 06/03/2015   Acute left lumbar radiculopathy 04/15/2014   Chronic sciatica of right side 03/17/2013   Diabetic peripheral neuropathy ([place]) 03/17/2013   Coronary artery disease    Carotid artery stenosis s/p CEA 2013 10/16/2011   Carotid artery occlusion without infarction, right 09/17/2011   Carotid bruit 08/28/2011   Gout 04/13/2011   DM type 2 with diabetic peripheral neuropathy ([place]) 04/02/2008   Hyperlipidemia associated with type 2 diabetes mellitus ([place]) 04/02/2008   Essential hypertension 04/02/2008   DEGENERATIVE [place] DISEASE, LUMBOSACRAL SPINE 04/02/2008   Past Surgical History:  Procedure Laterality Date   ABDOMINAL AORTOGRAM W/LOWER EXTREMITY N/A 08/07/2021   Procedure: ABDOMINAL AORTOGRAM W/LOWER EXTREMITY;  Surgeon: Waynetta Sandy, MD;  Location: [place] [facility];  Service: Cardiovascular;  Laterality: N/A;   ABDOMINAL AORTOGRAM W/LOWER EXTREMITY Right 10/23/2021   Procedure: ABDOMINAL AORTOGRAM W/LOWER EXTREMITY;  Surgeon: Waynetta Sandy, MD;  Location: [place] [facility];  Service: Cardiovascular;  Laterality: Right;   ABDOMINAL HYSTERECTOMY     APPLICATION OF INTRAOPERATIVE CT SCAN N/A 11/30/2020   Procedure: APPLICATION OF INTRAOPERATIVE CT SCAN;  Surgeon: Eustace Moore, MD;  Location: [place];  Service: Neurosurgery;  Laterality: N/A;  BACK SURGERY     CARDIAC CATHETERIZATION     CATARACT EXTRACTION, BILATERAL Bilateral 2021   Dr. Sherral Hammers   ENDARTERECTOMY  09/27/2011   Procedure: RIGT ENDARTERECTOMY CAROTID;  Surgeon: Mal Misty, MD;  Location: [place];  Service: Vascular;  Laterality:  Right;   EPIDURAL BLOCK INJECTION  02/2008   Drs. Eulis Manly   gyn surgery  2004   total hysterectomy for mennorhagia,,salpingoophorectomy   LAMINECTOMY WITH POSTERIOR LATERAL ARTHRODESIS LEVEL 2 N/A 11/30/2020   Procedure: LUMBAR FOUR-FIVE, LUMBAR FIVE-SACRAL ONE POSTERIOR LATERAL FUSION WITH REVISION OF LUMBAR ONE-FIVE HARDWARE AND EXTENSION TO SACRAL ONE AND SACRAL TWO;  Surgeon: Eustace Moore, MD;  Location: [place];  Service: Neurosurgery;  Laterality: N/A;   LUMBAR WOUND DEBRIDEMENT N/A 05/25/2020   Procedure: LUMBAR WOUND IRRIGATION AND DEBRIDEMENT;  Surgeon: Eustace Moore, MD;  Location: [place] [facility];  Service: Neurosurgery;  Laterality: N/A;   PERIPHERAL VASCULAR INTERVENTION  08/07/2021   Procedure: PERIPHERAL VASCULAR INTERVENTION;  Surgeon: Waynetta Sandy, MD;  Location: [place] [facility];  Service: Cardiovascular;;  Rt SFA   PERIPHERAL VASCULAR INTERVENTION Right 10/23/2021   Procedure: PERIPHERAL VASCULAR INTERVENTION;  Surgeon: Waynetta Sandy, MD;  Location: [place] [facility];  Service: Cardiovascular;  Laterality: Right;  SFA   POSTERIOR LUMBAR FUSION 4 LEVEL N/A 04/25/2020   Procedure: POSTERIOR LUMBAR INTERBODY FUSION LUMBAR ONE-TWO, LUMBAR TWO-THREE, LUMBAR THREE-FOUR,LUMBAR FOUR-FIVE.;  Surgeon: Eustace Moore, MD;  Location: [facility];  Service: Neurosurgery;  Laterality: N/A;  posterior   TONSILLECTOMY     TOTAL ABDOMINAL HYSTERECTOMY W/ BILATERAL SALPINGOOPHORECTOMY Bilateral 2000   TOTAL HIP ARTHROPLASTY Right 08/22/2018   Procedure: TOTAL HIP ARTHROPLASTY;  Surgeon: Earlie Server, MD;  [facility];  Service: Orthopedics;  Laterality: Right;   VARICOSE VEIN SURGERY  2008   stripping   Current Outpatient Medications on File Prior to Visit  Medication Sig Dispense Refill   ondansetron (ZOFRAN) 4 MG tablet Take 4 mg by mouth every 8 (eight) hours as needed.     allopurinol (ZYLOPRIM) 300 MG tablet Take 1 tablet (300 mg total) by mouth daily. 90  tablet 3   amLODipine (NORVASC) 10 MG tablet Take 10 mg by mouth in the morning.     aspirin EC 81 MG tablet Take 81 mg by mouth in the morning.     atorvastatin (LIPITOR) 80 MG tablet TAKE 1 TABLET BY MOUTH ONCE DAILY WITH  EVENING  MEAL (Patient taking differently: Take 80 mg by mouth in the morning.) 90 tablet 3   blood glucose meter kit and supplies KIT Check sugars twice daily 1 each 0   carvedilol (COREG CR) 20 MG 24 hr capsule Take 1 capsule (20 mg total) by mouth daily. 90 capsule 3   clopidogrel (PLAVIX) 75 MG tablet Take 75 mg by mouth in the morning.     Cyanocobalamin (VITAMIN B-12 IJ) Inject as directed every 30 (thirty) days.     ezetimibe (ZETIA) 10 MG tablet TAKE 1 TABLET EVERY DAY (NEED MD APPOINTMENT) 90 tablet 0   ferrous sulfate 325 (65 FE) MG tablet Take 325 mg by mouth in the morning and at bedtime.     gabapentin (NEURONTIN) 300 MG capsule Take 300 mg by mouth 3 (three) times daily.     glucose blood test strip Check sugars twice daily 100 each 11   metFORMIN (GLUCOPHAGE) 500 MG tablet Take 500 mg by mouth 2 (two) times daily with a meal.  Oxycodone HCl 10 MG TABS Take 10 mg by mouth every 6 (six) hours as needed (Pain).     oxyCODONE-acetaminophen (PERCOCET) 10-325 MG tablet Take 1 tablet by mouth every 4 (four) hours as needed for pain. 40 tablet 0   predniSONE (DELTASONE) 10 MG tablet Take 4 tablets (40 mg total) by mouth daily. 20 tablet 0   No current facility-administered medications on file prior to visit.    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    Comment: [employer]-retired 07/23/2014  Tobacco  Use   Smoking status: Every Day    Packs/day: 0.25    Years: 50.00    Total pack years: 12.50    Types: Cigarettes    Start date: 05/31/1968    Passive exposure: Never   Smokeless tobacco: Never   Tobacco comments:    Smoked x50 years, 1 pack would last 3 days. Currently smoking 4 cigarettes per day  Vaping Use   Vaping Use: Never used  Substance and Sexual Activity   Alcohol use: Yes    Comment: 1-2 drinks every other day   Drug use: No   Sexual activity: Not on file   Family History  Problem Relation Age of Onset   Hypertension Sister    Hypothyroidism Sister    Cancer Maternal Grandmother        lung   Hypertension Son    Hypertension Sister    Diabetes Brother        lost toe in 2018 with new diagnosis of DM   Hypertension Son    Immunization History  Administered Date(s) Administered   Influenza Inj Mdck Quad Pf 01/31/2016   Influenza Split 12/10/2011   Influenza Whole 12/28/2007, 11/26/2008, 02/03/2010   Influenza, High Dose Seasonal PF 11/24/2017, 12/04/2018, 01/20/2019   Influenza,inj,Quad PF,6+ Mos 12/14/2014, 11/25/2016   Influenza-Unspecified 12/21/2013, 11/24/2016   PFIZER(Purple Top)SARS-COV-2 Vaccination 04/21/2019, 05/12/2019, 12/13/2019   Pneumococcal Conjugate-13 04/15/2014   Pneumococcal Polysaccharide-23 02/27/2003, 02/03/2010, 06/17/2017   Tdap 08/08/2010   Zoster Recombinat (Shingrix) 12/23/2017, 03/11/2018     Review of Systems: Negative except as noted in the HPI.   Objective: There were no vitals filed for this visit.  April Shaffer is a pleasant 71 y.o. female in NAD. AAO X 3.  Vascular Examination: CFT <4 seconds b/l. DP/PT pulses diminished RLE. DP/PT faint LLE. Digital hair absent. Skin temperature gradient warm to cool b/l. No ischemia or gangrene. No cyanosis or clubbing noted b/l. No edema noted b/l LE.   Neurological Examination: Protective sensation decreased with 10 gram monofilament b/l. Vibratory sensation decreased  b/l.  Dermatological Examination: Pedal skin thin, shiny and atrophic b/l. No open wounds. No interdigital macerations. Toenails 1-5 b/l thick, discolored, elongated with subungual debris and pain on dorsal palpation.   Subungual longitudinal melanonychia noted left hallux nail and left 2nd digit. No extension into proximal nailfold.  Musculoskeletal Examination: Muscle strength 5/5 to all lower extremity muscle groups bilaterally. No pain, crepitus or joint limitation noted with ROM bilateral LE. No gross bony deformities bilaterally. Utilizes cane for ambulation assistance.  Radiographs: None  Last A1c:  Footwear Assessment: Does the patient wear appropriate shoes? Yes. Does the patient need inserts/orthotics? No.     Latest Ref Rng & Units 06/07/2021    9:58 AM  Hemoglobin A1C  Hemoglobin-A1c 4.8 - 5.6 % 7.3    ADA Risk Categorization: High Risk  Patient has one or more of the following: Loss of protective sensation Absent pedal pulses Severe Foot deformity History of foot ulcer  Assessment: 1. Pain due to onychomycosis of toenails of both feet   2. Longitudinal melanonychia   3. PAD (peripheral artery disease) ([place])   4. DM type 2 with diabetic peripheral neuropathy ([place])   5. Encounter for diabetic foot exam ([place])     Plan: No orders of the defined types were placed in this encounter.  -Patient was evaluated and treated. All patient's and/or POA's questions/concerns answered on today's visit. -Patient has discussed possible scheduling of nail biopsy of left great toe and left 2nd toe with Dr. Valentina Lucks and will schedule should patient decide she would like to proceed. -Diabetic foot examination performed today. -Continue foot and shoe inspections daily. Monitor blood glucose per PCP/Endocrinologist's recommendations. -Continue supportive shoe gear daily. -Toenails 1-5 b/l were debrided in length and girth with sterile nail nippers and dremel without iatrogenic  bleeding.  -Patient/POA to call should there be question/concern in the interim. Return in about 3 months (around 04/13/2022).  Marzetta Board, DPM

## 2022-02-02 ENCOUNTER — Other Ambulatory Visit: Payer: Self-pay

## 2022-02-02 DIAGNOSIS — I6523 Occlusion and stenosis of bilateral carotid arteries: Secondary | ICD-10-CM

## 2022-02-02 DIAGNOSIS — I6522 Occlusion and stenosis of left carotid artery: Secondary | ICD-10-CM

## 2022-02-15 ENCOUNTER — Inpatient Hospital Stay: Admission: RE | Admit: 2022-02-15 | Payer: Medicare HMO | Source: Ambulatory Visit

## 2022-02-15 ENCOUNTER — Other Ambulatory Visit: Payer: Self-pay | Admitting: Vascular Surgery

## 2022-02-15 ENCOUNTER — Ambulatory Visit
Admission: RE | Admit: 2022-02-15 | Discharge: 2022-02-15 | Disposition: A | Discharge status: Home / Self Care | Payer: Medicare HMO | Source: Ambulatory Visit | Attending: Vascular Surgery | Admitting: Vascular Surgery

## 2022-02-15 DIAGNOSIS — I6522 Occlusion and stenosis of left carotid artery: Secondary | ICD-10-CM

## 2022-02-15 DIAGNOSIS — I6523 Occlusion and stenosis of bilateral carotid arteries: Secondary | ICD-10-CM

## 2022-02-15 MED ORDER — IOPAMIDOL (ISOVUE-370) INJECTION 76%
75.0000 mL | Freq: Once | INTRAVENOUS | Status: AC | PRN
  Administered 2022-02-15: 75 mL via INTRAVENOUS

## 2022-02-21 ENCOUNTER — Inpatient Hospital Stay: Payer: Medicare HMO | Attending: Nurse Practitioner

## 2022-02-28 ENCOUNTER — Inpatient Hospital Stay: Payer: Medicare HMO | Attending: Nurse Practitioner

## 2022-02-28 ENCOUNTER — Other Ambulatory Visit: Payer: Self-pay

## 2022-02-28 DIAGNOSIS — E538 Deficiency of other specified B group vitamins: Secondary | ICD-10-CM | POA: Diagnosis not present

## 2022-02-28 DIAGNOSIS — D508 Other iron deficiency anemias: Secondary | ICD-10-CM | POA: Diagnosis present

## 2022-02-28 DIAGNOSIS — D649 Anemia, unspecified: Secondary | ICD-10-CM

## 2022-02-28 LAB — CBC WITH DIFFERENTIAL (CANCER CENTER ONLY)
Abs Immature Granulocytes: 0.02 10*3/uL (ref 0.00–0.07)
Basophils Absolute: 0.1 10*3/uL (ref 0.0–0.1)
Basophils Relative: 1 %
Eosinophils Absolute: 0.4 10*3/uL (ref 0.0–0.5)
Eosinophils Relative: 6 %
HCT: 37 % (ref 36.0–46.0)
Hemoglobin: 11.8 g/dL — ABNORMAL LOW (ref 12.0–15.0)
Immature Granulocytes: 0 %
Lymphocytes Relative: 28 %
Lymphs Abs: 1.7 10*3/uL (ref 0.7–4.0)
MCH: 25.6 pg — ABNORMAL LOW (ref 26.0–34.0)
MCHC: 31.9 g/dL (ref 30.0–36.0)
MCV: 80.3 fL (ref 80.0–100.0)
Monocytes Absolute: 0.6 10*3/uL (ref 0.1–1.0)
Monocytes Relative: 10 %
Neutro Abs: 3.5 10*3/uL (ref 1.7–7.7)
Neutrophils Relative %: 55 %
Platelet Count: 280 10*3/uL (ref 150–400)
RBC: 4.61 MIL/uL (ref 3.87–5.11)
RDW: 20.7 % — ABNORMAL HIGH (ref 11.5–15.5)
WBC Count: 6.2 10*3/uL (ref 4.0–10.5)
nRBC: 0 % (ref 0.0–0.2)

## 2022-02-28 LAB — VITAMIN B12: Vitamin B-12: 669 pg/mL (ref 180–914)

## 2022-03-01 ENCOUNTER — Telehealth: Payer: Self-pay

## 2022-03-01 NOTE — Telephone Encounter (Addendum)
Called patient and relayed message below.    ----- Message from Alla Feeling, NP sent at 03/01/2022  8:38 AM EST ----- Please call pt, let her know anemia has improved, hgb almost normal, and normal B12. Continue oral B12 daily. Please review next appt.   Thanks, Regan Rakers, NP

## 2022-03-02 ENCOUNTER — Other Ambulatory Visit: Payer: Self-pay | Admitting: Family

## 2022-03-02 DIAGNOSIS — E049 Nontoxic goiter, unspecified: Secondary | ICD-10-CM

## 2022-03-07 ENCOUNTER — Ambulatory Visit (INDEPENDENT_AMBULATORY_CARE_PROVIDER_SITE_OTHER): Payer: Medicare HMO | Admitting: Vascular Surgery

## 2022-03-07 ENCOUNTER — Encounter: Payer: Self-pay | Admitting: Vascular Surgery

## 2022-03-07 VITALS — BP 132/79 | HR 82 | Temp 98.3°F | Resp 20 | Ht 63.0 in | Wt 134.0 lb

## 2022-03-07 DIAGNOSIS — I6523 Occlusion and stenosis of bilateral carotid arteries: Secondary | ICD-10-CM

## 2022-03-07 DIAGNOSIS — I6521 Occlusion and stenosis of right carotid artery: Secondary | ICD-10-CM | POA: Diagnosis not present

## 2022-03-07 DIAGNOSIS — I6522 Occlusion and stenosis of left carotid artery: Secondary | ICD-10-CM | POA: Diagnosis not present

## 2022-03-07 NOTE — Progress Notes (Signed)
Patient ID: April Shaffer, female   DOB: [birth year], 72 y.o.   MRN: [id number]  Reason for Consult: Follow-up   Referred by Raymon Mutton., FNP  Subjective:     HPI:  April Shaffer is a 72 y.o. female has a history of a right carotid endarterectomy but subsequently occluded asymptomatically.  She also has coronary artery disease with hypertension hyperlipidemia and diabetes.  She also has recent stenting of the right SFA for rest pain.  She denies any stroke, TIA or amaurosis.  She remains on aspirin Plavix and statin.  She is here today to discuss the results of her recent CTA of her neck and head.  Past Medical History:  Diagnosis Date   Anemia    Arthritis    Blood transfusion without reported diagnosis    Carotid artery narrowing    Coronary artery disease    Cardiac catheterization November 2013: 50% ostial LAD stenosis 50% mid stenosis. 30% disease in the left circumflex.   Diabetes mellitus, type 2 ([facility])    Hyperlipidemia    Hypertension    Onychomycosis of toenail 07/31/2016   Sleep apnea       Had surgery to correct   Family History  Problem Relation Age of Onset   Hypertension Sister    Hypothyroidism Sister    Cancer Maternal Grandmother        lung   Hypertension Son    Hypertension Sister    Diabetes Brother        lost toe in 2018 with new diagnosis of DM   Hypertension Son    Past Surgical History:  Procedure Laterality Date   ABDOMINAL AORTOGRAM W/LOWER EXTREMITY N/A 08/07/2021   Procedure: ABDOMINAL AORTOGRAM W/LOWER EXTREMITY;  Surgeon: Maeola Harman, MD;  Location: [facility];  Service: Cardiovascular;  Laterality: N/A;   ABDOMINAL AORTOGRAM W/LOWER EXTREMITY Right 10/23/2021   Procedure: ABDOMINAL AORTOGRAM W/LOWER EXTREMITY;  Surgeon: Maeola Harman, MD;  Location: [facility] IN[facility]ASIVE [facility];  Service: Cardiovascular;  Laterality: Right;   ABDOMINAL HYSTERECTOMY     APPLICATION OF INTRAOPERATIVE CT SCAN N/A 11/30/2020    Procedure: APPLICATION OF INTRAOPERATIVE CT SCAN;  Surgeon: Tia Alert, MD;  Location: [facility] OR;  Service: Neurosurgery;  Laterality: N/A;   BACK SURGERY     CARDIAC CATHETERIZATION     CATARACT EXTRACTION, BILATERAL Bilateral 2021   Dr. Joseph Art   ENDARTERECTOMY  09/27/2011   Procedure: RIGT ENDARTERECTOMY CAROTID;  Surgeon: Pryor Ochoa, MD;  Location: [facility] OR;  Service: Vascular;  Laterality: Right;   EPIDURAL BLOCK INJECTION  02/2008   Drs. Dorthula Nettles   gyn surgery  2004   total hysterectomy for mennorhagia,,salpingoophorectomy   LAMINECTOMY WITH POSTERIOR LATERAL ARTHRODESIS LEVEL 2 N/A 11/30/2020   Procedure: LUMBAR FOUR-FIVE, LUMBAR FIVE-SACRAL ONE POSTERIOR LATERAL FUSION WITH REVISION OF LUMBAR ONE-FIVE HARDWARE AND EXTENSION TO SACRAL ONE AND SACRAL TWO;  Surgeon: Tia Alert, MD;  Location: [facility] OR;  Service: Neurosurgery;  Laterality: N/A;   LUMBAR WOUND DEBRIDEMENT N/A 05/25/2020   Procedure: LUMBAR WOUND IRRIGATION AND DEBRIDEMENT;  Surgeon: Tia Alert, MD;  Location: [facility] OR;  Service: Neurosurgery;  Laterality: N/A;   PERIPHERAL VASCULAR INTERVENTION  08/07/2021   Procedure: PERIPHERAL VASCULAR INTERVENTION;  Surgeon: Maeola Harman, MD;  Location: [facility] INVASIVE [facility];  Service: Cardiovascular;;  Rt SFA   PERIPHERAL VASCULAR INTERVENTION Right 10/23/2021   Procedure: PERIPHERAL VASCULAR INTERVENTION;  Surgeon: Maeola Harman, MD;  Location:  [place] [facility]AS[facility];  Service: Cardiovascular;  Laterality: Right;  SFA   POSTERIOR LUMBAR FUSION 4 LEVEL N/A 04/25/2020   Procedure: POSTERIOR LUMBAR INTERBODY FUSION LUMBAR ONE-TWO, LUMBAR TWO-THREE, LUMBAR THREE-FOUR,LUMBAR FOUR-FIVE.;  Surgeon: Eustace Moore, MD;  Location: [place];  Service: Neurosurgery;  Laterality: N/A;  posterior   TONSILLECTOMY     TOTAL ABDOMINAL HYSTERECTOMY W/ BILATERAL SALPINGOOPHORECTOMY Bilateral 2000   TOTAL HIP ARTHROPLASTY Right 08/22/2018   Procedure: TOTAL HIP ARTHROPLASTY;   Surgeon: Earlie Server, MD;  Location: [facility];  Service: Orthopedics;  Laterality: Right;   VARICOSE VEIN SURGERY  2008   stripping    Short Social History:  Social History   Tobacco Use   Smoking status: Every Day    Packs/day: 0.25    Years: 50.00    Total pack years: 12.50    Types: Cigarettes    Start date: 05/31/1968    Passive exposure: Never   Smokeless tobacco: Never   Tobacco comments:    Smoked x50 years, 1 pack would last 3 days. Currently smoking 4 cigarettes per day  Substance Use Topics   Alcohol use: Yes    Comment: 1-2 drinks every other day    Allergies  Allergen Reactions   Zestoretic [Lisinopril-Hydrochlorothiazide] Swelling    Angioedema  - Tongue swelling    Codeine Itching   Oxycodone Itching    Patient is taking at this time   Ultram [Tramadol] Itching   Zocor [Simvastatin] Other (See Comments)    Muscle pain    Current Outpatient Medications  Medication Sig Dispense Refill   allopurinol (ZYLOPRIM) 300 MG tablet Take 1 tablet (300 mg total) by mouth daily. 90 tablet 3   amLODipine (NORVASC) 10 MG tablet Take 10 mg by mouth in the morning.     aspirin EC 81 MG tablet Take 81 mg by mouth in the morning.     atorvastatin (LIPITOR) 80 MG tablet TAKE 1 TABLET BY MOUTH ONCE DAILY WITH  EVENING  MEAL (Patient taking differently: Take 80 mg by mouth in the morning.) 90 tablet 3   blood glucose meter kit and supplies KIT Check sugars twice daily 1 each 0   carvedilol (COREG CR) 20 MG 24 hr capsule Take 1 capsule (20 mg total) by mouth daily. 90 capsule 3   clopidogrel (PLAVIX) 75 MG tablet Take 75 mg by mouth in the morning.     Cyanocobalamin (VITAMIN B-12 IJ) Inject as directed every 30 (thirty) days.     ezetimibe (ZETIA) 10 MG tablet TAKE 1 TABLET EVERY DAY (NEED MD APPOINTMENT) 90 tablet 0   ferrous sulfate 325 (65 FE) MG tablet Take 325 mg by mouth in the morning and at bedtime.     gabapentin (NEURONTIN) 300 MG capsule Take 300 mg by mouth 3  (three) times daily.     glucose blood test strip Check sugars twice daily 100 each 11   metFORMIN (GLUCOPHAGE) 500 MG tablet Take 500 mg by mouth 2 (two) times daily with a meal.     ondansetron (ZOFRAN) 4 MG tablet Take 4 mg by mouth every 8 (eight) hours as needed.     Oxycodone HCl 10 MG TABS Take 10 mg by mouth every 6 (six) hours as needed (Pain).     oxyCODONE-acetaminophen (PERCOCET) 10-325 MG tablet Take 1 tablet by mouth every 4 (four) hours as needed for pain. 40 tablet 0   predniSONE (DELTASONE) 10 MG tablet Take 4 tablets (40 mg total) by mouth daily.  20 tablet 0   No current facility-administered medications for this visit.    Review of Systems  Constitutional:  Constitutional negative. HENT: HENT negative.  Eyes: Eyes negative.  Respiratory: Respiratory negative.  Cardiovascular: Cardiovascular negative.  GI: Gastrointestinal negative.  Musculoskeletal: Musculoskeletal negative.  Skin: Skin negative.  Neurological: Neurological negative. Hematologic: Hematologic/lymphatic negative.  Psychiatric: Psychiatric negative.        Objective:  Objective   Vitals:   03/07/22 1017  BP: 132/79  Pulse: 82  Resp: 20  Temp: 98.3 F (36.8 C)  SpO2: 97%  Weight: 134 lb (60.8 kg)  Height: 5\' 3"  (1.6 m)   Body mass index is 23.74 kg/m.  Physical Exam HENT:     Head: Normocephalic.     Nose: Nose normal.  Eyes:     Pupils: Pupils are equal, round, and reactive to light.  Neck:     Comments: Well healed right neck incision Cardiovascular:     Rate and Rhythm: Normal rate.  Pulmonary:     Effort: Pulmonary effort is normal.  Abdominal:     General: Abdomen is flat.     Palpations: Abdomen is soft.  Musculoskeletal:        General: Normal range of motion.     Cervical back: Neck supple.  Skin:    General: Skin is warm and dry.     Capillary Refill: Capillary refill takes less than 2 seconds.  Neurological:     General: No focal deficit present.     Mental  Status: She is alert.  Psychiatric:        Mood and Affect: Mood normal.     Data: CT IMPRESSION: 1. 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 2. Stable high-grade stenosis of the left carotid bifurcation of approximately 75%. 3. Stable severe bilateral vertebral artery stenoses at the origins. 4. 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 5. Stable moderate inferior left M3 stenosis. 6. The previously seen posterior right M3 stenosis is not present on today's exam. 7. Stable mild distal small vessel disease without other significant proximal stenosis, aneurysm, or branch vessel occlusion within the Circle of Willis. 8. Stable left thyroid goiter. Recommend thyroid US (ref: J Am Coll Radiol. 2015 Feb;12(2): 143-50). 9. Aortic Atherosclerosis (ICD10-I70.0) and Emphysema (ICD10-J43.9).       Assessment/Plan:    72 year old female well-known to this office with history of right carotid endarterectomy and subsequently occluded silently and also history of right lower extremity rest pain treated with stenting requiring repeat intervention.  Now with high-grade stenosis on the left that is asymptomatic.  I discussed her options being ongoing medical therapy versus stent versus carotid endarterectomy.  We reviewed her CT scan together today and have elected for transcarotid artery stent on the left.  Will have her cleared by cardiology and then proceed in the near future.  She will remain on aspirin, Plavix and statin.  We discussed the risk benefits alternatives and she demonstrates good understanding.     Waynetta Sandy MD [facility]

## 2022-03-08 ENCOUNTER — Ambulatory Visit
Admission: RE | Admit: 2022-03-08 | Discharge: 2022-03-08 | Disposition: A | Discharge status: Home / Self Care | Payer: Medicare HMO | Source: Ambulatory Visit | Attending: Family | Admitting: Family

## 2022-03-08 DIAGNOSIS — E049 Nontoxic goiter, unspecified: Secondary | ICD-10-CM

## 2022-03-13 NOTE — Progress Notes (Signed)
Cardiology Clinic Note   Patient Name: April Shaffer Date of Encounter: 03/16/2022  Primary Care Provider:  Sonia Side., FNP Primary Cardiologist:  Quay Burow, MD  Patient Profile    72 year old female with history of carotid artery disease being followed by Dr. Donzetta Matters, with planned right  endarterectomy due to high-grade stenosis in the left carotid artery.,  HTN, HL, Type II diabetes, CAD with cardiac cath in 2013 which did not demonstrate critical stenosis. Seen last by Dr. Gwenlyn Found on 12/08/2019.  She is here for cardiac evaluation prior to procedure and recommendations concerning clopidogrel.  Past Medical History    Past Medical History:  Diagnosis Date   Anemia    Arthritis    Blood transfusion without reported diagnosis    Carotid artery narrowing    Coronary artery disease    Cardiac catheterization November 2013: 50% ostial LAD stenosis 50% mid stenosis. 30% disease in the left circumflex.   Diabetes mellitus, type 2 ([place])    Hyperlipidemia    Hypertension    Onychomycosis of toenail 07/31/2016   Sleep apnea       Had surgery to correct   Past Surgical History:  Procedure Laterality Date   ABDOMINAL AORTOGRAM W/LOWER EXTREMITY N/A 08/07/2021   Procedure: ABDOMINAL AORTOGRAM W/LOWER EXTREMITY;  Surgeon: Waynetta Sandy, MD;  Location: [facility];  Service: Cardiovascular;  Laterality: N/A;   ABDOMINAL AORTOGRAM W/LOWER EXTREMITY Right 10/23/2021   Procedure: ABDOMINAL AORTOGRAM W/LOWER EXTREMITY;  Surgeon: Waynetta Sandy, MD;  Location: [facility];  Service: Cardiovascular;  Laterality: Right;   ABDOMINAL HYSTERECTOMY     APPLICATION OF INTRAOPERATIVE CT SCAN N/A 11/30/2020   Procedure: APPLICATION OF INTRAOPERATIVE CT SCAN;  Surgeon: Eustace Moore, MD;  Location: [place];  Service: Neurosurgery;  Laterality: N/A;   BACK SURGERY     CARDIAC CATHETERIZATION     CATARACT EXTRACTION, BILATERAL Bilateral 2021   Dr. Sherral Hammers    ENDARTERECTOMY  09/27/2011   Procedure: RIGT ENDARTERECTOMY CAROTID;  Surgeon: Mal Misty, MD;  Location: [place];  Service: Vascular;  Laterality: Right;   EPIDURAL BLOCK INJECTION  02/2008   Drs. Eulis Manly   gyn surgery  2004   total hysterectomy for mennorhagia,,salpingoophorectomy   LAMINECTOMY WITH POSTERIOR LATERAL ARTHRODESIS LEVEL 2 N/A 11/30/2020   Procedure: LUMBAR FOUR-FIVE, LUMBAR FIVE-SACRAL ONE POSTERIOR LATERAL FUSION WITH REVISION OF LUMBAR ONE-FIVE HARDWARE AND EXTENSION TO SACRAL ONE AND SACRAL TWO;  Surgeon: Eustace Moore, MD;  Location: [place];  Service: Neurosurgery;  Laterality: N/A;   LUMBAR WOUND DEBRIDEMENT N/A 05/25/2020   Procedure: LUMBAR WOUND IRRIGATION AND DEBRIDEMENT;  Surgeon: Eustace Moore, MD;  Location: [place];  Service: Neurosurgery;  Laterality: N/A;   PERIPHERAL VASCULAR INTERVENTION  08/07/2021   Procedure: PERIPHERAL VASCULAR INTERVENTION;  Surgeon: Waynetta Sandy, MD;  Location: [facility];  Service: Cardiovascular;;  Rt SFA   PERIPHERAL VASCULAR INTERVENTION Right 10/23/2021   Procedure: PERIPHERAL VASCULAR INTERVENTION;  Surgeon: Waynetta Sandy, MD;  Location: [place] [facility];  Service: Cardiovascular;  Laterality: Right;  SFA   POSTERIOR LUMBAR FUSION 4 LEVEL N/A 04/25/2020   Procedure: POSTERIOR LUMBAR INTERBODY FUSION LUMBAR ONE-TWO, LUMBAR TWO-THREE, LUMBAR THREE-FOUR,LUMBAR FOUR-FIVE.;  Surgeon: Eustace Moore, MD;  Location: [place];  Service: Neurosurgery;  Laterality: N/A;  posterior   TONSILLECTOMY     TOTAL ABDOMINAL HYSTERECTOMY W/ BILATERAL SALPINGOOPHORECTOMY Bilateral 2000   TOTAL HIP ARTHROPLASTY Right 08/22/2018  Procedure: TOTAL HIP ARTHROPLASTY;  Surgeon: Frederico Hamman, MD;  Location: [facility];  Service: Orthopedics;  Laterality: Right;   VARICOSE VEIN SURGERY  2008   stripping    Allergies  Allergies  Allergen Reactions   Zestoretic [Lisinopril-Hydrochlorothiazide] Swelling     Angioedema  - Tongue swelling    Codeine Itching   Oxycodone Itching    Patient is taking at this time   Ultram [Tramadol] Itching   Zocor [Simvastatin] Other (See Comments)    Muscle pain    History of Present Illness    [name] comes today for cardiology preoperative evaluation prior to having left carotid artery endarterectomy by Dr. Randie Heinz.  She has had extensive surgeries on her back most recently October 2023 with cadaver implants.  She has regained her strength and is back to walking although not strenuously.  She is not picking up heavy objects or doing excessive physical exercise at this time.  She is mostly walking through her home without too much fatigue.  She does have some deconditioning of her muscles in general and would like to begin an exercise program due to deconditioning when she is able after surgery.  She denies any chest discomfort, bleeding on clopidogrel, dizziness, or dyspnea on exertion.  Home Medications    Current Outpatient Medications  Medication Sig Dispense Refill   allopurinol (ZYLOPRIM) 300 MG tablet Take 1 tablet (300 mg total) by mouth daily. 90 tablet 3   amLODipine (NORVASC) 10 MG tablet Take 10 mg by mouth in the morning.     aspirin EC 81 MG tablet Take 81 mg by mouth in the morning.     atorvastatin (LIPITOR) 80 MG tablet TAKE 1 TABLET BY MOUTH ONCE DAILY WITH  EVENING  MEAL (Patient taking differently: Take 80 mg by mouth in the morning.) 90 tablet 3   blood glucose meter kit and supplies KIT Check sugars twice daily 1 each 0   carvedilol (COREG CR) 20 MG 24 hr capsule Take 1 capsule (20 mg total) by mouth daily. 90 capsule 3   clopidogrel (PLAVIX) 75 MG tablet Take 75 mg by mouth in the morning.     Cyanocobalamin (VITAMIN B-12 IJ) Inject as directed every 30 (thirty) days.     ezetimibe (ZETIA) 10 MG tablet TAKE 1 TABLET EVERY DAY (NEED MD APPOINTMENT) 90 tablet 0   ferrous sulfate 325 (65 FE) MG tablet Take 325 mg by mouth in the morning and  at bedtime.     gabapentin (NEURONTIN) 300 MG capsule Take 300 mg by mouth 3 (three) times daily.     glucose blood test strip Check sugars twice daily 100 each 11   guaiFENesin-codeine 100-10 MG/5ML syrup Take 10 mLs by mouth every 6 (six) hours as needed.     metFORMIN (GLUCOPHAGE) 500 MG tablet Take 500 mg by mouth 2 (two) times daily with a meal.     ondansetron (ZOFRAN) 4 MG tablet Take 4 mg by mouth every 8 (eight) hours as needed.     oxyCODONE-acetaminophen (PERCOCET) 10-325 MG tablet Take 1 tablet by mouth every 4 (four) hours as needed for pain. 40 tablet 0   Oxycodone HCl 10 MG TABS Take 10 mg by mouth every 6 (six) hours as needed (Pain). (Patient not taking: Reported on 03/16/2022)     predniSONE (DELTASONE) 10 MG tablet Take 4 tablets (40 mg total) by mouth daily. (Patient not taking: Reported on 03/16/2022) 20 tablet 0   No current facility-administered medications for this  visit.     Family History    Family History  Problem Relation Age of Onset   Hypertension Sister    Hypothyroidism Sister    Cancer Maternal Grandmother        lung   Hypertension Son    Hypertension Sister    Diabetes Brother        lost toe in 2018 with new diagnosis of DM   Hypertension Son    She indicated that her mother is deceased. She indicated that her father is deceased. She indicated that both of her sisters are alive. She indicated that only one of her two brothers is alive. She indicated that her maternal grandmother is deceased. She indicated that both of her sons are alive.  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    Comment: [employer]-retired 07/23/2014  Tobacco Use   Smoking status: Every Day    Packs/day:  0.25    Years: 50.00    Total pack years: 12.50    Types: Cigarettes    Start date: 05/31/1968    Passive exposure: Never   Smokeless tobacco: Never   Tobacco comments:    Smoked x50 years, 1 pack would last 3 days. Currently smoking 4 cigarettes per day  Vaping Use   Vaping Use: Never used  Substance and Sexual Activity   Alcohol use: Yes    Comment: 1-2 drinks every other day   Drug use: No   Sexual activity: Not on file  Other Topics Concern   Not on file  Social History Narrative   Lives with long term boyfriend (30 years together)   Both sons live in [place]   Social Determinants of Health   Financial Resource Strain: Low Risk  (08/06/2018)   Overall Financial Resource Strain (CARDIA)    Difficulty of Paying Living Expenses: Not hard at all  Food Insecurity: No Food Insecurity (08/07/2019)   Hunger Vital Sign    Worried About Running Out of Food in the Last Year: Never true    Ran Out of Food in the Last Year: Never true  Transportation Needs: No Transportation Needs (08/07/2019)   PRAPARE - Administrator, Civil Service (Medical): No    Lack of Transportation (Non-Medical): No  Physical Activity: Inactive (06/17/2017)   Exercise Vital Sign    Days of Exercise per Week: 0 days    Minutes of Exercise per Session: 0 min  Stress: No Stress Concern Present (06/17/2017)   [employer] of Occupational Health - Occupational Stress Questionnaire    Feeling of Stress : Not at all  Social Connections: Unknown (08/06/2018)   Social Connection and Isolation Panel [NHANES]    Frequency of Communication with Friends and Family: Not on file    Frequency of Social Gatherings with Friends and Family: Not on file    Attends Religious Services: Not on file    Active Member of Clubs or Organizations: Not on file    Attends Banker Meetings: Not on file    Marital Status: Living with partner  Intimate Partner Violence: Not At Risk (08/07/2019)   Humiliation,  Afraid, Rape, and Kick questionnaire    Fear of  Current or Ex-Partner: No    Emotionally Abused: No    Physically Abused: No    Sexually Abused: No     Review of Systems    General:  No chills, fever, night sweats or weight changes.  Cardiovascular:  No chest pain, dyspnea on exertion, edema, orthopnea, palpitations, paroxysmal nocturnal dyspnea. Dermatological: No rash, lesions/masses Respiratory: No cough, dyspnea Urologic: No hematuria, dysuria Abdominal:   No nausea, vomiting, diarrhea, bright red blood per rectum, melena, or hematemesis Neurologic:  No visual changes, wkns, changes in mental status. All other systems reviewed and are otherwise negative except as noted above.     Physical Exam    VS:  BP 128/66 (BP Location: Left Arm, Patient Position: Sitting, Cuff Size: Normal)   Pulse 67   Ht 5\' 3"  (1.6 m)   Wt 135 lb (61.2 kg)   SpO2 95%   BMI 23.91 kg/m  , BMI Body mass index is 23.91 kg/m.     GEN: Well nourished, well developed, in no acute distress. HEENT: normal. Neck: Supple, no JVD, carotid bruits, or masses. Cardiac: RRR, no murmurs, rubs, or gallops. No clubbing, cyanosis, edema.  Radials/DP/PT 2+ and equal bilaterally.  Respiratory:  Respirations regular and unlabored, clear to auscultation bilaterally. GI: Soft, nontender, nondistended, BS + x 4. MS: no deformity or atrophy. Skin: warm and dry, no rash. Neuro:  Strength and sensation are intact. Psych: Normal affect.  Accessory Clinical Findings    ECG personally reviewed by me today-normal sinus rhythm, heart rate of 67 bpm- No acute changes.  Lab Results  Component Value Date   WBC 6.2 02/28/2022   HGB 11.8 (L) 02/28/2022   HCT 37.0 02/28/2022   MCV 80.3 02/28/2022   PLT 280 02/28/2022   Lab Results  Component Value Date   CREATININE 0.90 10/23/2021   BUN 13 10/23/2021   NA 139 10/23/2021   K 3.7 10/23/2021   CL 102 10/23/2021   CO2 25 08/21/2021   Lab Results  Component Value Date    ALT 7 08/21/2021   AST 13 (L) 08/21/2021   ALKPHOS 141 (H) 08/21/2021   BILITOT 0.5 08/21/2021   Lab Results  Component Value Date   CHOL 122 12/20/2020   HDL 33 (L) 12/20/2020   LDLCALC 72 12/20/2020   LDLDIRECT 139.7 03/28/2012   TRIG 85 12/20/2020   CHOLHDL 3.7 12/20/2020    Lab Results  Component Value Date   HGBA1C 7.3 (H) 06/07/2021    Review of Prior Studies:  Carotid Doppler Ultrasound 01/24/2022     Summary: Right Carotid: Evidence consistent with a total occlusion of the right ICA.Non-hemodynamically significant plaque <50% noted in the CCA.  Left Carotid: Velocities in the left ICA are consistent with a 80-99% Stenosis Non-hemodynamically significant plaque <50% noted in the CCA. ECA appears >50% stenosed.  Vertebrals:  Bilateral vertebral arteries demonstrate antegrade flow. Left              vertebral artery waveform is blunted. Subclavians: Normal flow hemodynamics were seen in bilateral subclavian              arteries.  Abdominal Aortogram with LE Run-Off 08/07/2021 Procedure Performed: 1.  Ultrasound-guided cannulation left common femoral artery 2.  Right lower extremity angiography 3.  Stent of right SFA with 6 x 60 mm Eluvia 4.  Mynx device closure left common femoral artery 5.  Moderate sedation with fentanyl and Versed for 25 minutes  NM Stress Test 03/28/2018 Nuclear stress EF:  68%. The left ventricular ejection fraction is hyperdynamic (>65%). There was no ST segment deviation noted during stress. The study is normal. This is a low risk study.   Normal pharmacologic nuclear study with no evidence for a prior infarct or ischemia. Since the prior study in 2013 ischemia is no longer present.   Assessment & Plan   Pre-Operative Cardiac Clearance: Able to complete <4.0 METS. Per Revised Cardiac Risk Index, consider minor  risk with >11% chance of adverse cardiac events perioperatively given history of CAD, CHF, and stroke. However, she is  well optimized from a cardiac standpoint. . Therefore, based on ACC/AHA guidelines, patient would be at acceptable risk for the planned procedure without further cardiovascular testing.  Most recent NM stress test 03/27/2018 low risk study.   Recommend stopping ASA and Plavix 5 days prior to surgery and to start again ASAP post operatively at the discretion of surgeon.   2.  Hypertension: Blood pressure currently well-controlled and have reviewed trends prior to this office visit with essentially controlled blood pressure.  Remains on carvedilol 20 mg daily and amlodipine 10 mg daily.  No recommendations for changes at this time.  3.  Hyperlipidemia: Currently on atorvastatin 80 mg daily.  With PAD and CAD recommend LDL less than 50.  Especially in light of other cardiovascular risk factors to include diabetes.  4.  Carotid artery disease: Known diagnoses with prior history of right carotid artery disease of the ICA and now severe left carotid artery disease of the ICA.  Being followed by Dr. Randie Heinz.  Continues on dual antiplatelet dual antiplatelet therapy with aspirin and Plavix.  Recommendations as above preoperatively.  5.  Degenerative disc disease of the lumbar spine: Status post surgical interventions followed by Dr. Yetta Barre, neurosurgeon.  6.  History of iron deficiency anemia: Followed by PCP.  Current medicines are reviewed at length with the patient today.  I have spent 30 min's  dedicated to the care of this patient on the date of this encounter to include pre-visit review of records, assessment, management and diagnostic testing,with shared decision making. Signed, Bettey Mare. Liborio Nixon, ANP, AACC   03/16/2022 2:53 PM      Office [phone] Fax [phone]  Notice: This dictation was prepared with Dragon dictation along with smaller phrase technology. Any transcriptional errors that result from this process are unintentional and may not be corrected upon review.

## 2022-03-16 ENCOUNTER — Encounter: Payer: Self-pay | Admitting: Adult Health

## 2022-03-16 ENCOUNTER — Ambulatory Visit: Payer: Medicare HMO | Attending: Adult Health | Admitting: Adult Health

## 2022-03-16 VITALS — BP 128/66 | HR 67 | Ht 63.0 in | Wt 135.0 lb

## 2022-03-16 DIAGNOSIS — Z01818 Encounter for other preprocedural examination: Secondary | ICD-10-CM

## 2022-03-16 NOTE — Patient Instructions (Signed)
Medication Instructions:  No Changes *If you need a refill on your cardiac medications before your next appointment, please call your pharmacy*   Lab Work: No labs If you have labs (blood work) drawn today and your tests are completely normal, you will receive your results only by: [place] (if you have MyChart) OR A paper copy in the mail If you have any lab test that is abnormal or we need to change your treatment, we will call you to review the results.   Testing/Procedures: No Testing    Follow-Up: At [facility], you and your health needs are our priority.  As part of our continuing mission to provide you with exceptional heart care, we have created designated Provider Care Teams.  These Care Teams include your primary Cardiologist (physician) and Advanced Practice Providers (APPs -  Physician Assistants and Nurse Practitioners) who all work together to provide you with the care you need, when you need it.  We recommend signing up for the patient portal called "MyChart".  Sign up information is provided on this After Visit Summary.  MyChart is used to connect with patients for Virtual Visits (Telemedicine).  Patients are able to view lab/test results, encounter notes, upcoming appointments, etc.  Non-urgent messages can be sent to your provider as well.   To learn more about what you can do with MyChart, go to [email or web address].    Your next appointment:   6 month(s)  Provider:   Quay Burow, MD     Other Instructions Per instruction from surgeon will advise to hold Plavix when date for surgery is scheduled.

## 2022-04-02 ENCOUNTER — Other Ambulatory Visit (HOSPITAL_COMMUNITY): Payer: Self-pay | Admitting: Family

## 2022-04-02 DIAGNOSIS — E049 Nontoxic goiter, unspecified: Secondary | ICD-10-CM

## 2022-04-03 ENCOUNTER — Other Ambulatory Visit: Payer: Self-pay

## 2022-04-03 ENCOUNTER — Other Ambulatory Visit: Payer: Self-pay | Admitting: Family

## 2022-04-03 ENCOUNTER — Encounter: Payer: Self-pay | Admitting: General Practice

## 2022-04-03 DIAGNOSIS — I6522 Occlusion and stenosis of left carotid artery: Secondary | ICD-10-CM

## 2022-04-03 DIAGNOSIS — E049 Nontoxic goiter, unspecified: Secondary | ICD-10-CM

## 2022-04-03 NOTE — Progress Notes (Signed)
Aletta Edouard, MD  Allen Kell, NT OK for [place] FNA of 4.7 cm left thyroid mass. Try to schedule at [facility], if possible.  GY       Previous Messages    ----- Message ----- From: Allen Kell, NT Sent: 04/02/2022   2:10 PM EST To: Ir Procedure Requests Subject: [place] FNA BX THYROID 1ST LESION AFIRMA            Procedure: [place] FNA BX THYROID 1ST LESION AFIRMA  Reason: Enlargement of thyroid  History: US Thyroid in chart IMPRESSION: 1. 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 Alternatively, given stability for the past 4 years, a follow-up examination performed after 02/2023 would ensure 5 years of stability and thus a benign etiology. 2. Moderate amount of atherosclerotic plaque within the incidentally imaged bilateral common carotid arteries. Further evaluation with dedicated carotid Doppler ultrasound could be performed as indicated.   Provider: Sonia Side., FNP  Contact: [phone]

## 2022-04-10 ENCOUNTER — Ambulatory Visit (HOSPITAL_COMMUNITY)
Admission: EM | Admit: 2022-04-10 | Discharge: 2022-04-10 | Disposition: A | Discharge status: Home / Self Care | Payer: Medicare HMO | Attending: Internal Medicine | Admitting: Internal Medicine

## 2022-04-10 ENCOUNTER — Ambulatory Visit (INDEPENDENT_AMBULATORY_CARE_PROVIDER_SITE_OTHER): Payer: Medicare HMO

## 2022-04-10 ENCOUNTER — Encounter (HOSPITAL_COMMUNITY): Payer: Self-pay

## 2022-04-10 DIAGNOSIS — M25511 Pain in right shoulder: Secondary | ICD-10-CM | POA: Diagnosis not present

## 2022-04-10 DIAGNOSIS — D638 Anemia in other chronic diseases classified elsewhere: Secondary | ICD-10-CM | POA: Insufficient documentation

## 2022-04-10 MED ORDER — METHYLPREDNISOLONE SODIUM SUCC 125 MG IJ SOLR
80.0000 mg | Freq: Once | INTRAMUSCULAR | Status: AC
  Administered 2022-04-10: 80 mg via INTRAMUSCULAR

## 2022-04-10 MED ORDER — METHYLPREDNISOLONE SODIUM SUCC 125 MG IJ SOLR
INTRAMUSCULAR | Status: AC
  Filled 2022-04-10: qty 2

## 2022-04-10 MED ORDER — PREDNISONE 20 MG PO TABS: 20.0000 mg | ORAL_TABLET | Freq: Every day | ORAL | 0 refills | Status: AC

## 2022-04-10 NOTE — Progress Notes (Unsigned)
[place]   [phone] [phone]   Clinic Follow up Note   Patient Care Team: Sonia Side., FNP as PCP - General (Family Medicine) Lorretta Harp, MD as PCP - Cardiology (Cardiology) Earlie Server, MD as Consulting Physician (Orthopedic Surgery) Truitt Merle, MD as Consulting Physician (Hematology) Alla Feeling, NP as Nurse Practitioner (Nurse Practitioner) Sonia Side., FNP as Nurse Practitioner (Family Medicine) Eustace Moore, MD as Consulting Physician (Neurosurgery)  Date of Service:  04/11/2022  CHIEF COMPLAINT: f/u of  anemia   CURRENT THERAPY:   B12 injections, starting 08/31/21, currently monthly Iv venofer as needed   ASSESSMENT:  April Shaffer is a 72 y.o. female with   B12 deficiency -previous lab showed low B12 and elevated MMA which confirms B12 deficiency.   -intrinsic factor from 08/31/21 was normal  -she began B12 injections on 08/31/21, weekly through 10/11/21 and now on monthly.  Anemia of chronic disease -iron panel 08/01/21 indicative of anemia of chronic disease rather than true iron deficiency  --iron on 12/06/21 was 19. She received 3 doses IV Venofer from 12/22/21.  She responded well, hemoglobin improved to 11.8 after IV iron.   PLAN: -Lab reviewed hg 11.8 -B12 -pending if low will change back to injections -lab in 3 and 6 months -f/u in 6 months   INTERVAL HISTORY:  April Shaffer is here for a follow up of   anemia She was last seen by me on 01/08/2022 She presents to the clinic alone. Pt had nerve in her shoulder she couldn't move it. Pt take oral Inon and B12 every day.   All other systems were reviewed with the patient and are negative.  MEDICAL HISTORY:  Past Medical History:  Diagnosis Date   Anemia    Arthritis    Blood transfusion without reported diagnosis    Carotid artery narrowing    Coronary artery disease    Cardiac catheterization November 2013: 50% ostial LAD stenosis  50% mid stenosis. 30% disease in the left circumflex.   Diabetes mellitus, type 2 ([place])    Hyperlipidemia    Hypertension    Onychomycosis of toenail 07/31/2016   Sleep apnea       Had surgery to correct    SURGICAL HISTORY: Past Surgical History:  Procedure Laterality Date   ABDOMINAL AORTOGRAM W/LOWER EXTREMITY N/A 08/07/2021   Procedure: ABDOMINAL AORTOGRAM W/LOWER EXTREMITY;  Surgeon: Waynetta Sandy, MD;  Location: [facility];  Service: Cardiovascular;  Laterality: N/A;   ABDOMINAL AORTOGRAM W/LOWER EXTREMITY Right 10/23/2021   Procedure: ABDOMINAL AORTOGRAM W/LOWER EXTREMITY;  Surgeon: Waynetta Sandy, MD;  Location: [facility];  Service: Cardiovascular;  Laterality: Right;   ABDOMINAL HYSTERECTOMY     APPLICATION OF INTRAOPERATIVE CT SCAN N/A 11/30/2020   Procedure: APPLICATION OF INTRAOPERATIVE CT SCAN;  Surgeon: Eustace Moore, MD;  Location: [facility];  Service: Neurosurgery;  Laterality: N/A;   BACK SURGERY     CARDIAC CATHETERIZATION     CATARACT EXTRACTION, BILATERAL Bilateral 2021   Dr. Sherral Hammers   ENDARTERECTOMY  09/27/2011   Procedure: RIGT ENDARTERECTOMY CAROTID;  Surgeon: Mal Misty, MD;  Location: [facility];  Service: Vascular;  Laterality: Right;   EPIDURAL BLOCK INJECTION  02/2008   Drs. Eulis Manly   gyn surgery  2004   total hysterectomy for mennorhagia,,salpingoophorectomy   LAMINECTOMY WITH POSTERIOR LATERAL ARTHRODESIS LEVEL 2 N/A 11/30/2020   Procedure: LUMBAR FOUR-FIVE, LUMBAR FIVE-SACRAL ONE  POSTERIOR LATERAL FUSION WITH REVISION OF LUMBAR ONE-FIVE HARDWARE AND EXTENSION TO SACRAL ONE AND SACRAL TWO;  Surgeon: Eustace Moore, MD;  Location: [facility];  Service: Neurosurgery;  Laterality: N/A;   LUMBAR WOUND DEBRIDEMENT N/A 05/25/2020   Procedure: LUMBAR WOUND IRRIGATION AND DEBRIDEMENT;  Surgeon: Eustace Moore, MD;  Location: [place] [place];  Service: Neurosurgery;  Laterality: N/A;   PERIPHERAL VASCULAR INTERVENTION  08/07/2021    Procedure: PERIPHERAL VASCULAR INTERVENTION;  Surgeon: Waynetta Sandy, MD;  Location: [facility];  Service: Cardiovascular;;  Rt SFA   PERIPHERAL VASCULAR INTERVENTION Right 10/23/2021   Procedure: PERIPHERAL VASCULAR INTERVENTION;  Surgeon: Waynetta Sandy, MD;  Location: [facility];  Service: Cardiovascular;  Laterality: Right;  SFA   POSTERIOR LUMBAR FUSION 4 LEVEL N/A 04/25/2020   Procedure: POSTERIOR LUMBAR INTERBODY FUSION LUMBAR ONE-TWO, LUMBAR TWO-THREE, LUMBAR THREE-FOUR,LUMBAR FOUR-FIVE.;  Surgeon: Eustace Moore, MD;  Location: [facility];  Service: Neurosurgery;  Laterality: N/A;  posterior   TONSILLECTOMY     TOTAL ABDOMINAL HYSTERECTOMY W/ BILATERAL SALPINGOOPHORECTOMY Bilateral 2000   TOTAL HIP ARTHROPLASTY Right 08/22/2018   Procedure: TOTAL HIP ARTHROPLASTY;  Surgeon: Earlie Server, MD;  Location: [facility];  Service: Orthopedics;  Laterality: Right;   VARICOSE VEIN SURGERY  2008   stripping    I have reviewed the social history and family history with the patient and they are unchanged from previous note.  ALLERGIES:  is allergic to zestoretic [lisinopril-hydrochlorothiazide], codeine, oxycodone, ultram [tramadol], and zocor [simvastatin].  MEDICATIONS:  Current Outpatient Medications  Medication Sig Dispense Refill   allopurinol (ZYLOPRIM) 300 MG tablet Take 1 tablet (300 mg total) by mouth daily. 90 tablet 3   amLODipine (NORVASC) 10 MG tablet Take 10 mg by mouth in the morning.     aspirin EC 81 MG tablet Take 81 mg by mouth in the morning.     atorvastatin (LIPITOR) 80 MG tablet TAKE 1 TABLET BY MOUTH ONCE DAILY WITH  EVENING  MEAL (Patient taking differently: Take 80 mg by mouth in the morning.) 90 tablet 3   blood glucose meter kit and supplies KIT Check sugars twice daily 1 each 0   carvedilol (COREG CR) 20 MG 24 hr capsule Take 1 capsule (20 mg total) by mouth daily. 90 capsule 3   clopidogrel (PLAVIX) 75 MG tablet Take 75 mg by mouth  in the morning.     cyanocobalamin (VITAMIN B12) 1000 MCG tablet Take 1,000 mcg by mouth daily.     ezetimibe (ZETIA) 10 MG tablet TAKE 1 TABLET EVERY DAY (NEED MD APPOINTMENT) 90 tablet 0   ferrous sulfate 325 (65 FE) MG tablet Take 325 mg by mouth in the morning and at bedtime.     gabapentin (NEURONTIN) 300 MG capsule Take 300 mg by mouth 3 (three) times daily.     glucose blood test strip Check sugars twice daily 100 each 11   metFORMIN (GLUCOPHAGE) 500 MG tablet Take 500 mg by mouth 2 (two) times daily with a meal.     oxyCODONE-acetaminophen (PERCOCET) 10-325 MG tablet Take 1 tablet by mouth every 4 (four) hours as needed for pain. (Patient taking differently: Take 1 tablet by mouth every 6 (six) hours as needed for pain.) 40 tablet 0   predniSONE (DELTASONE) 20 MG tablet Take 1 tablet (20 mg total) by mouth daily for 5 days. 5 tablet 0   No current facility-administered medications for this visit.    PHYSICAL EXAMINATION: ECOG PERFORMANCE STATUS: 1 -  Symptomatic but completely ambulatory  Vitals:   04/11/22 1120  BP: (!) 152/66  Pulse: 80  Resp: 14  Temp: 97.7 F (36.5 C)  SpO2: 98%   Wt Readings from Last 3 Encounters:  04/11/22 135 lb 14.4 oz (61.6 kg)  03/16/22 135 lb (61.2 kg)  03/07/22 134 lb (60.8 kg)   GENERAL:alert, no distress and comfortable SKIN: skin color normal, no rashes or significant lesions EYES: normal, Conjunctiva are pink and non-injected, sclera clear  NEURO: alert & oriented x 3 with fluent speech     LABORATORY DATA:  I have reviewed the data as listed    Latest Ref Rng & Units 04/11/2022   10:37 AM 02/28/2022   10:59 AM 01/08/2022   10:07 AM  CBC  WBC 4.0 - 10.5 K/uL 15.9  6.2  8.3   Hemoglobin 12.0 - 15.0 g/dL 11.8  11.8  9.8   Hematocrit 36.0 - 46.0 % 36.3  37.0  31.2   Platelets 150 - 400 K/uL 446  280  361         Latest Ref Rng & Units 10/23/2021    6:23 AM 08/21/2021   12:21 PM 08/07/2021    1:16 PM  CMP  Glucose 70 - 99 mg/dL  106  114  102   BUN 8 - 23 mg/dL 13  11  10   $ Creatinine 0.44 - 1.00 mg/dL 0.90  0.77  0.40   Sodium 135 - 145 mmol/L 139  137  140   Potassium 3.5 - 5.1 mmol/L 3.7  4.6  4.6   Chloride 98 - 111 mmol/L 102  104  103   CO2 22 - 32 mmol/L  25    Calcium 8.9 - 10.3 mg/dL  9.0    Total Protein 6.5 - 8.1 g/dL  8.3    Total Bilirubin 0.3 - 1.2 mg/dL  0.5    Alkaline Phos 38 - 126 U/L  141    AST 15 - 41 U/L  13    ALT 0 - 44 U/L  7        RADIOGRAPHIC STUDIES: I have personally reviewed the radiological images as listed and agreed with the findings in the report. DG Shoulder Right  Result Date: 04/10/2022 CLINICAL DATA:  Right shoulder pain EXAM: RIGHT SHOULDER - 2+ VIEW COMPARISON:  None Available. FINDINGS: No fracture or malalignment. Prominent inferior acromial spurring. AC joint and glenohumeral degenerative change. Right apex is clear IMPRESSION: No acute osseous abnormality. Degenerative changes with prominent inferior acromial spurring. Electronically Signed   By: Donavan Foil M.D.   On: 04/10/2022 20:28      No orders of the defined types were placed in this encounter.  All questions were answered. The patient knows to call the clinic with any problems, questions or concerns. No barriers to learning was detected. The total time spent in the appointment was 15 minutes.     Truitt Merle, MD 04/11/2022   Felicity Coyer, CMA, am acting as scribe for Truitt Merle, MD.   I have reviewed the above documentation for accuracy and completeness, and I agree with the above.

## 2022-04-10 NOTE — Assessment & Plan Note (Signed)
-  iron panel 08/01/21 indicative of anemia of chronic disease rather than true iron deficiency  --iron on 12/06/21 was 19. She received 3 doses IV Venofer from 12/22/21.  She responded well, hemoglobin improved to 11.8 after IV iron.

## 2022-04-10 NOTE — ED Triage Notes (Signed)
Pt presents with complaints of right shoulder pain that goes into her clavicle and her ear. Pt is concerned for infection. Denies injury, reports waking up with the pain.

## 2022-04-10 NOTE — Discharge Instructions (Addendum)
Prednisone 35m once a day for the next 5 days for pain and inflammation to the shoulder.  Apply heat to the neck and shoulder to reduce muscle spasm.   Perform gentle range of motion exercises.   Follow-up with orthopedic provider soon for reevaluation.   If you develop any new or worsening symptoms or do not improve in the next 2 to 3 days, please return.  If your symptoms are severe, please go to the emergency room.  Follow-up with your primary care provider for further evaluation and management of your symptoms as well as ongoing wellness visits.  I hope you feel better!

## 2022-04-10 NOTE — Assessment & Plan Note (Signed)
-  previous lab showed low B12 and elevated MMA which confirms B12 deficiency.   -intrinsic factor from 08/31/21 was normal  -she began B12 injections on 08/31/21, weekly through 10/11/21 and now on monthly.

## 2022-04-11 ENCOUNTER — Inpatient Hospital Stay (HOSPITAL_BASED_OUTPATIENT_CLINIC_OR_DEPARTMENT_OTHER): Payer: Medicare HMO | Admitting: Hematology

## 2022-04-11 ENCOUNTER — Inpatient Hospital Stay: Payer: Medicare HMO | Attending: Nurse Practitioner

## 2022-04-11 ENCOUNTER — Other Ambulatory Visit: Payer: Self-pay

## 2022-04-11 ENCOUNTER — Other Ambulatory Visit (HOSPITAL_COMMUNITY): Payer: Medicare HMO

## 2022-04-11 ENCOUNTER — Encounter: Payer: Self-pay | Admitting: Hematology

## 2022-04-11 VITALS — BP 152/66 | HR 80 | Temp 97.7°F | Resp 14 | Wt 135.9 lb

## 2022-04-11 DIAGNOSIS — Z7902 Long term (current) use of antithrombotics/antiplatelets: Secondary | ICD-10-CM | POA: Diagnosis not present

## 2022-04-11 DIAGNOSIS — E119 Type 2 diabetes mellitus without complications: Secondary | ICD-10-CM | POA: Diagnosis not present

## 2022-04-11 DIAGNOSIS — Z7952 Long term (current) use of systemic steroids: Secondary | ICD-10-CM | POA: Diagnosis not present

## 2022-04-11 DIAGNOSIS — Z79899 Other long term (current) drug therapy: Secondary | ICD-10-CM | POA: Diagnosis not present

## 2022-04-11 DIAGNOSIS — I10 Essential (primary) hypertension: Secondary | ICD-10-CM | POA: Insufficient documentation

## 2022-04-11 DIAGNOSIS — D649 Anemia, unspecified: Secondary | ICD-10-CM

## 2022-04-11 DIAGNOSIS — D638 Anemia in other chronic diseases classified elsewhere: Secondary | ICD-10-CM | POA: Diagnosis not present

## 2022-04-11 DIAGNOSIS — E538 Deficiency of other specified B group vitamins: Secondary | ICD-10-CM | POA: Diagnosis present

## 2022-04-11 DIAGNOSIS — D508 Other iron deficiency anemias: Secondary | ICD-10-CM | POA: Insufficient documentation

## 2022-04-11 DIAGNOSIS — Z7984 Long term (current) use of oral hypoglycemic drugs: Secondary | ICD-10-CM | POA: Insufficient documentation

## 2022-04-11 LAB — CBC WITH DIFFERENTIAL (CANCER CENTER ONLY)
Abs Immature Granulocytes: 0.18 10*3/uL — ABNORMAL HIGH (ref 0.00–0.07)
Basophils Absolute: 0 10*3/uL (ref 0.0–0.1)
Basophils Relative: 0 %
Eosinophils Absolute: 0 10*3/uL (ref 0.0–0.5)
Eosinophils Relative: 0 %
HCT: 36.3 % (ref 36.0–46.0)
Hemoglobin: 11.8 g/dL — ABNORMAL LOW (ref 12.0–15.0)
Immature Granulocytes: 1 %
Lymphocytes Relative: 5 %
Lymphs Abs: 0.9 10*3/uL (ref 0.7–4.0)
MCH: 26.8 pg (ref 26.0–34.0)
MCHC: 32.5 g/dL (ref 30.0–36.0)
MCV: 82.5 fL (ref 80.0–100.0)
Monocytes Absolute: 0.5 10*3/uL (ref 0.1–1.0)
Monocytes Relative: 3 %
Neutro Abs: 14.4 10*3/uL — ABNORMAL HIGH (ref 1.7–7.7)
Neutrophils Relative %: 91 %
Platelet Count: 446 10*3/uL — ABNORMAL HIGH (ref 150–400)
RBC: 4.4 MIL/uL (ref 3.87–5.11)
RDW: 16.7 % — ABNORMAL HIGH (ref 11.5–15.5)
WBC Count: 15.9 10*3/uL — ABNORMAL HIGH (ref 4.0–10.5)
nRBC: 0 % (ref 0.0–0.2)

## 2022-04-11 LAB — VITAMIN B12: Vitamin B-12: 746 pg/mL (ref 180–914)

## 2022-04-11 NOTE — Pre-Procedure Instructions (Signed)
Surgical Instructions    Your procedure is scheduled on Tuesday, February 20th.  Report to [facility] Main Entrance "A" at 05:30 A.M., then check in with the Admitting office.  Call this number if you have problems the morning of surgery:  [phone]  If you have any questions prior to your surgery date call [phone]: Open Monday-Friday 8am-4pm If you experience any cold or flu symptoms such as cough, fever, chills, shortness of breath, etc. between now and your scheduled surgery, please notify us at the above number.     Remember:  Do not eat or drink after midnight the night before your surgery     Take these medicines the morning of surgery with A SIP OF WATER  allopurinol (ZYLOPRIM)  amLODipine (NORVASC)  aspirin  atorvastatin (LIPITOR)  carvedilol (COREG CR)  clopidogrel (PLAVIX)  ezetimibe (ZETIA)  gabapentin (NEURONTIN)  predniSONE (DELTASONE)     If needed: oxyCODONE-acetaminophen (PERCOCET)    As of today, STOP taking any Aleve, Naproxen, Ibuprofen, Motrin, Advil, Goody's, BC's, all herbal medications, fish oil, and all vitamins.  WHAT DO I DO ABOUT MY DIABETES MEDICATION?   Do not take metFORMIN (GLUCOPHAGE) the morning of surgery.     HOW TO MANAGE YOUR DIABETES BEFORE AND AFTER SURGERY  Why is it important to control my blood sugar before and after surgery? Improving blood sugar levels before and after surgery helps healing and can limit problems. A way of improving blood sugar control is eating a healthy diet by:  Eating less sugar and carbohydrates  Increasing activity/exercise  Talking with your doctor about reaching your blood sugar goals High blood sugars (greater than 180 mg/dL) can raise your risk of infections and slow your recovery, so you will need to focus on controlling your diabetes during the weeks before surgery. Make sure that the doctor who takes care of your diabetes knows about your planned surgery including the date and  location.  How do I manage my blood sugar before surgery? Check your blood sugar at least 4 times a day, starting 2 days before surgery, to make sure that the level is not too high or low.  Check your blood sugar the morning of your surgery when you wake up and every 2 hours until you get to the Short Stay unit.  If your blood sugar is less than 70 mg/dL, you will need to treat for low blood sugar: Do not take insulin. Treat a low blood sugar (less than 70 mg/dL) with  cup of clear juice (cranberry or apple), 4 glucose tablets, OR glucose gel. Recheck blood sugar in 15 minutes after treatment (to make sure it is greater than 70 mg/dL). If your blood sugar is not greater than 70 mg/dL on recheck, call [phone] for further instructions. Report your blood sugar to the short stay nurse when you get to Short Stay.  If you are admitted to the hospital after surgery: Your blood sugar will be checked by the staff and you will probably be given insulin after surgery (instead of oral diabetes medicines) to make sure you have good blood sugar levels. The goal for blood sugar control after surgery is 80-180 mg/dL.                     Do NOT Smoke (Tobacco/Vaping) for 24 hours prior to your procedure.  If you use a CPAP at night, you may bring your mask/headgear for your overnight stay.   Contacts, glasses, piercing's, hearing aid's, dentures  or partials may not be worn into surgery, please bring cases for these belongings.    For patients admitted to the hospital, discharge time will be determined by your treatment team.   Patients discharged the day of surgery will not be allowed to drive home, and someone needs to stay with them for 24 hours.  SURGICAL WAITING ROOM VISITATION Patients having surgery or a procedure may have no more than 2 support people in the waiting area - these visitors may rotate.   Children under the age of 8 must have an adult with them who is not the patient. If the  patient needs to stay at the hospital during part of their recovery, the visitor guidelines for inpatient rooms apply. Pre-op nurse will coordinate an appropriate time for 1 support person to accompany patient in pre-op.  This support person may not rotate.   Please refer to the [facility] website for the visitor guidelines for Inpatients (after your surgery is over and you are in a regular room).    Special instructions:   [place]- Preparing For Surgery  Before surgery, you can play an important role. Because skin is not sterile, your skin needs to be as free of germs as possible. You can reduce the number of germs on your skin by washing with CHG (chlorahexidine gluconate) Soap before surgery.  CHG is an antiseptic cleaner which kills germs and bonds with the skin to continue killing germs even after washing.    Oral Hygiene is also important to reduce your risk of infection.  Remember - BRUSH YOUR TEETH THE MORNING OF SURGERY WITH YOUR REGULAR TOOTHPASTE  Please do not use if you have an allergy to CHG or antibacterial soaps. If your skin becomes reddened/irritated stop using the CHG.  Do not shave (including legs and underarms) for at least 48 hours prior to first CHG shower. It is OK to shave your face.  Please follow these instructions carefully.   Shower the NIGHT BEFORE SURGERY and the MORNING OF SURGERY  If you chose to wash your hair, wash your hair first as usual with your normal shampoo.  After you shampoo, rinse your hair and body thoroughly to remove the shampoo.  Use CHG Soap as you would any other liquid soap. You can apply CHG directly to the skin and wash gently with a scrungie or a clean washcloth.   Apply the CHG Soap to your body ONLY FROM THE NECK DOWN.  Do not use on open wounds or open sores. Avoid contact with your eyes, ears, mouth and genitals (private parts). Wash Face and genitals (private parts)  with your normal soap.   Wash thoroughly, paying special  attention to the area where your surgery will be performed.  Thoroughly rinse your body with warm water from the neck down.  DO NOT shower/wash with your normal soap after using and rinsing off the CHG Soap.  Pat yourself dry with a CLEAN TOWEL.  Wear CLEAN PAJAMAS to bed the night before surgery  Place CLEAN SHEETS on your bed the night before your surgery  DO NOT SLEEP WITH PETS.   Day of Surgery: Take a shower with CHG soap. Do not wear jewelry or makeup Do not wear lotions, powders, perfumes, or deodorant. Do not shave 48 hours prior to surgery.   Do not bring valuables to the hospital. [facility] is not responsible for any belongings or valuables. Do not wear nail polish, gel polish, artificial nails, or any other type  of covering on natural nails (fingers and toes) If you have artificial nails or gel coating that need to be removed by a nail salon, please have this removed prior to surgery. Artificial nails or gel coating may interfere with anesthesia's ability to adequately monitor your vital signs. Wear Clean/Comfortable clothing the morning of surgery Remember to brush your teeth WITH YOUR REGULAR TOOTHPASTE.   Please read over the following fact sheets that you were given.    If you received a COVID test during your pre-op visit  it is requested that you wear a mask when out in public, stay away from anyone that may not be feeling well and notify your surgeon if you develop symptoms. If you have been in contact with anyone that has tested positive in the last 10 days please notify you surgeon.

## 2022-04-12 ENCOUNTER — Encounter (HOSPITAL_COMMUNITY): Payer: Self-pay | Admitting: Vascular Surgery

## 2022-04-12 ENCOUNTER — Encounter (HOSPITAL_COMMUNITY): Payer: Self-pay

## 2022-04-12 ENCOUNTER — Telehealth: Payer: Self-pay

## 2022-04-12 ENCOUNTER — Other Ambulatory Visit: Payer: Self-pay

## 2022-04-12 ENCOUNTER — Encounter (HOSPITAL_COMMUNITY)
Admission: RE | Admit: 2022-04-12 | Discharge: 2022-04-12 | Disposition: A | Discharge status: Home / Self Care | Payer: Medicare HMO | Source: Ambulatory Visit | Attending: Vascular Surgery | Admitting: Vascular Surgery

## 2022-04-12 ENCOUNTER — Inpatient Hospital Stay: Admission: RE | Admit: 2022-04-12 | Payer: Medicare HMO | Source: Ambulatory Visit

## 2022-04-12 VITALS — BP 159/68 | HR 82 | Temp 97.6°F | Resp 16 | Ht 63.0 in | Wt 135.0 lb

## 2022-04-12 DIAGNOSIS — E1142 Type 2 diabetes mellitus with diabetic polyneuropathy: Secondary | ICD-10-CM | POA: Diagnosis not present

## 2022-04-12 DIAGNOSIS — Z01812 Encounter for preprocedural laboratory examination: Secondary | ICD-10-CM | POA: Diagnosis present

## 2022-04-12 DIAGNOSIS — Z01818 Encounter for other preprocedural examination: Secondary | ICD-10-CM

## 2022-04-12 DIAGNOSIS — I6522 Occlusion and stenosis of left carotid artery: Secondary | ICD-10-CM | POA: Insufficient documentation

## 2022-04-12 HISTORY — DX: Peripheral vascular disease, unspecified: I73.9

## 2022-04-12 HISTORY — DX: Nontoxic single thyroid nodule: E04.1

## 2022-04-12 LAB — CBC
HCT: 34.9 % — ABNORMAL LOW (ref 36.0–46.0)
Hemoglobin: 11.2 g/dL — ABNORMAL LOW (ref 12.0–15.0)
MCH: 26.7 pg (ref 26.0–34.0)
MCHC: 32.1 g/dL (ref 30.0–36.0)
MCV: 83.3 fL (ref 80.0–100.0)
Platelets: 441 10*3/uL — ABNORMAL HIGH (ref 150–400)
RBC: 4.19 MIL/uL (ref 3.87–5.11)
RDW: 17.1 % — ABNORMAL HIGH (ref 11.5–15.5)
WBC: 20 10*3/uL — ABNORMAL HIGH (ref 4.0–10.5)
nRBC: 0 % (ref 0.0–0.2)

## 2022-04-12 LAB — URINALYSIS, ROUTINE W REFLEX MICROSCOPIC
Bilirubin Urine: NEGATIVE
Glucose, UA: NEGATIVE mg/dL
Hgb urine dipstick: NEGATIVE
Ketones, ur: 5 mg/dL — AB
Leukocytes,Ua: NEGATIVE
Nitrite: NEGATIVE
Protein, ur: 100 mg/dL — AB
Specific Gravity, Urine: 1.024 (ref 1.005–1.030)
pH: 5 (ref 5.0–8.0)

## 2022-04-12 LAB — COMPREHENSIVE METABOLIC PANEL
ALT: 14 U/L (ref 0–44)
AST: 20 U/L (ref 15–41)
Albumin: 2.6 g/dL — ABNORMAL LOW (ref 3.5–5.0)
Alkaline Phosphatase: 114 U/L (ref 38–126)
Anion gap: 10 (ref 5–15)
BUN: 21 mg/dL (ref 8–23)
CO2: 26 mmol/L (ref 22–32)
Calcium: 9 mg/dL (ref 8.9–10.3)
Chloride: 99 mmol/L (ref 98–111)
Creatinine, Ser: 0.99 mg/dL (ref 0.44–1.00)
GFR, Estimated: >60 mL/min (ref >60)
Glucose, Bld: 133 mg/dL — ABNORMAL HIGH (ref 70–99)
Potassium: 3.6 mmol/L (ref 3.5–5.1)
Sodium: 135 mmol/L (ref 135–145)
Total Bilirubin: 0.1 mg/dL — ABNORMAL LOW (ref 0.3–1.2)
Total Protein: 9 g/dL — ABNORMAL HIGH (ref 6.5–8.1)

## 2022-04-12 LAB — HEMOGLOBIN A1C
Hgb A1c MFr Bld: 6.4 % — ABNORMAL HIGH (ref 4.8–5.6)
Mean Plasma Glucose: 136.98 mg/dL

## 2022-04-12 LAB — GLUCOSE, CAPILLARY: Glucose-Capillary: 160 mg/dL — ABNORMAL HIGH (ref 70–99)

## 2022-04-12 LAB — APTT: aPTT: 27 seconds (ref 24–36)

## 2022-04-12 LAB — TYPE AND SCREEN
ABO/RH(D): O POS
Antibody Screen: NEGATIVE

## 2022-04-12 LAB — PROTIME-INR
INR: 1.1 (ref 0.8–1.2)
Prothrombin Time: 14.3 seconds (ref 11.4–15.2)

## 2022-04-12 LAB — SURGICAL PCR SCREEN
MRSA, PCR: NEGATIVE
Staphylococcus aureus: NEGATIVE

## 2022-04-12 NOTE — Progress Notes (Signed)
Patients WBC at 20, 000. Myra Gianotti, PA-C and Dr. Claretha Cooper office made aware.

## 2022-04-12 NOTE — Telephone Encounter (Signed)
Patient is scheduled for a left TCAR on 04/17/22. I received a notification from preadmission testing regarding abnormal WBC count- 20 and noticed an increase from yesterday at 15.9. Message sent to Dr. Donzetta Matters to review and advise of any additional recommendations.

## 2022-04-12 NOTE — ED Provider Notes (Signed)
[place]    CSN: [id number] Arrival date & time: 04/10/22  1948      History   Chief Complaint Chief Complaint  Patient presents with   Shoulder Pain    HPI April Shaffer is a 72 y.o. female.   Patient presents to urgent care for evaluation of right-sided neck and shoulder discomfort that started this morning upon waking. No recent trauma/injuries to the shoulder, denies recent falls and head injuries as well. No numbness or tingling, color change, or decreased strength to the bilateral upper extremities. No chest pain, shortness of breath, cough, dizziness, fever/chills, headache, ear pain, viral URI symptoms, or heart palpitations reported. No back pain or abdominal pain. Denies history of previous injury to the right shoulder/neck. No midline neck pain. Pain is mostly to the right trapezius muscle and worsened by movement. When she keeps her right arm still, the pain is manageable. She is unsure of she "slept on her shoulder wrong". She as not taken any medicine for symptoms before coming to urgent care. No recent steroid use. She is a diabetic and states she has been taking medicines as prescribed.   Shoulder Pain   Past Medical History:  Diagnosis Date   Anemia    Arthritis    Blood transfusion without reported diagnosis    Carotid artery narrowing    Coronary artery disease    Cardiac catheterization November 2013: 50% ostial LAD stenosis 50% mid stenosis. 30% disease in the left circumflex.   Diabetes mellitus, type 2 ([place])    Hyperlipidemia    Hypertension    Onychomycosis of toenail 07/31/2016   Sleep apnea       Had surgery to correct    Patient Active Problem List   Diagnosis Date Noted   Anemia of chronic disease 04/10/2022   Lumbar pseudoarthrosis 01/24/2022   Occlusion and stenosis of right carotid artery 01/24/2022   Iron deficiency anemia secondary to blood loss (chronic) 01/08/2022   B12 deficiency 08/31/2021   Medication monitoring  encounter 12/12/2020   Nausea 12/12/2020   Hammer toes of both feet 11/09/2020   Acute maxillary sinusitis 08/08/2020   Hyperlipidemia 08/08/2020   Pain due to onychomycosis of toenails of both feet 08/08/2020   Therapeutic drug monitoring 06/22/2020   Wound infection after surgery 05/25/2020   Wound drainage 05/24/2020   Local infection of the skin and subcutaneous tissue, unspecified 05/24/2020   Body mass index (BMI) 27.0-27.9, adult 05/10/2020   S/P lumbar fusion 04/25/2020   Microalbuminuria due to type 2 diabetes mellitus ([place]) 08/07/2019   Primary localized osteoarthritis of right hip 08/22/2018   Carpal tunnel syndrome, bilateral 08/06/2018   Type 2 diabetes mellitus with peripheral neuropathy ([place]) 08/06/2018   Degenerative joint disease of right hip 08/06/2018   Preoperative clearance 03/26/2018   Upper back pain on left side 10/30/2016   Encounter for postoperative carotid endarterectomy surveillance 12/07/2015   Varicosities of leg 06/03/2015   Acute left lumbar radiculopathy 04/15/2014   Chronic sciatica of right side 03/17/2013   Diabetic peripheral neuropathy ([place]) 03/17/2013   Coronary artery disease    Carotid artery stenosis s/p CEA 2013 10/16/2011   Carotid artery occlusion without infarction, right 09/17/2011   Carotid bruit 08/28/2011   Gout 04/13/2011   DM type 2 with diabetic peripheral neuropathy ([place]) 04/02/2008   Hyperlipidemia associated with type 2 diabetes mellitus ([place]) 04/02/2008   Essential hypertension 04/02/2008   DEGENERATIVE [place] DISEASE, LUMBOSACRAL SPINE 04/02/2008    Past Surgical History:  Procedure Laterality Date   ABDOMINAL AORTOGRAM W/LOWER EXTREMITY N/A 08/07/2021   Procedure: ABDOMINAL AORTOGRAM W/LOWER EXTREMITY;  Surgeon: Waynetta Sandy, MD;  Location: [facility];  Service: Cardiovascular;  Laterality: N/A;   ABDOMINAL AORTOGRAM W/LOWER EXTREMITY Right 10/23/2021   Procedure: ABDOMINAL AORTOGRAM W/LOWER  EXTREMITY;  Surgeon: Waynetta Sandy, MD;  Location: [facility];  Service: Cardiovascular;  Laterality: Right;   ABDOMINAL HYSTERECTOMY     APPLICATION OF INTRAOPERATIVE CT SCAN N/A 11/30/2020   Procedure: APPLICATION OF INTRAOPERATIVE CT SCAN;  Surgeon: Eustace Moore, MD;  Location: [facility];  Service: Neurosurgery;  Laterality: N/A;   BACK SURGERY     CARDIAC CATHETERIZATION     CATARACT EXTRACTION, BILATERAL Bilateral 2021   Dr. Sherral Hammers   ENDARTERECTOMY  09/27/2011   Procedure: RIGT ENDARTERECTOMY CAROTID;  Surgeon: Mal Misty, MD;  Location: [facility];  Service: Vascular;  Laterality: Right;   EPIDURAL BLOCK INJECTION  02/2008   Drs. Eulis Manly   gyn surgery  2004   total hysterectomy for mennorhagia,,salpingoophorectomy   LAMINECTOMY WITH POSTERIOR LATERAL ARTHRODESIS LEVEL 2 N/A 11/30/2020   Procedure: LUMBAR FOUR-FIVE, LUMBAR FIVE-SACRAL ONE POSTERIOR LATERAL FUSION WITH REVISION OF LUMBAR ONE-FIVE HARDWARE AND EXTENSION TO SACRAL ONE AND SACRAL TWO;  Surgeon: Eustace Moore, MD;  Location: [facility];  Service: Neurosurgery;  Laterality: N/A;   LUMBAR WOUND DEBRIDEMENT N/A 05/25/2020   Procedure: LUMBAR WOUND IRRIGATION AND DEBRIDEMENT;  Surgeon: Eustace Moore, MD;  Location: [facility];  Service: Neurosurgery;  Laterality: N/A;   PERIPHERAL VASCULAR INTERVENTION  08/07/2021   Procedure: PERIPHERAL VASCULAR INTERVENTION;  Surgeon: Waynetta Sandy, MD;  Location: [facility];  Service: Cardiovascular;;  Rt SFA   PERIPHERAL VASCULAR INTERVENTION Right 10/23/2021   Procedure: PERIPHERAL VASCULAR INTERVENTION;  Surgeon: Waynetta Sandy, MD;  Location: [facility];  Service: Cardiovascular;  Laterality: Right;  SFA   POSTERIOR LUMBAR FUSION 4 LEVEL N/A 04/25/2020   Procedure: POSTERIOR LUMBAR INTERBODY FUSION LUMBAR ONE-TWO, LUMBAR TWO-THREE, LUMBAR THREE-FOUR,LUMBAR FOUR-FIVE.;  Surgeon: Eustace Moore, MD;  Location: [facility];  Service:  Neurosurgery;  Laterality: N/A;  posterior   TONSILLECTOMY     TOTAL ABDOMINAL HYSTERECTOMY W/ BILATERAL SALPINGOOPHORECTOMY Bilateral 2000   TOTAL HIP ARTHROPLASTY Right 08/22/2018   Procedure: TOTAL HIP ARTHROPLASTY;  Surgeon: Earlie Server, MD;  Location: [facility];  Service: Orthopedics;  Laterality: Right;   VARICOSE VEIN SURGERY  2008   stripping    OB History     Gravida  2   Para  2   Term  2   Preterm      AB      Living  2      SAB      IAB      Ectopic      Multiple      Live Births               Home Medications    Prior to Admission medications   Medication Sig Start Date End Date Taking? Authorizing Provider  predniSONE (DELTASONE) 20 MG tablet Take 1 tablet (20 mg total) by mouth daily for 5 days. 04/10/22 04/15/22 Yes StanhopeStasia Cavalier, FNP  allopurinol (ZYLOPRIM) 300 MG tablet Take 1 tablet (300 mg total) by mouth daily. 07/09/19   Mack Hook, MD  amLODipine (NORVASC) 10 MG tablet Take 10 mg by mouth in the morning.    [provider]  aspirin EC 81 MG  tablet Take 81 mg by mouth in the morning.    [provider]  atorvastatin (LIPITOR) 80 MG tablet TAKE 1 TABLET BY MOUTH ONCE DAILY WITH  EVENING  MEAL Patient taking differently: Take 80 mg by mouth in the morning. 07/09/19   Mack Hook, MD  blood glucose meter kit and supplies KIT Check sugars twice daily 06/10/18   Mack Hook, MD  carvedilol (COREG CR) 20 MG 24 hr capsule Take 1 capsule (20 mg total) by mouth daily. 07/09/19   Mack Hook, MD  clopidogrel (PLAVIX) 75 MG tablet Take 75 mg by mouth in the morning.    [provider]  cyanocobalamin (VITAMIN B12) 1000 MCG tablet Take 1,000 mcg by mouth daily.    [provider]  ezetimibe (ZETIA) 10 MG tablet TAKE 1 TABLET EVERY DAY (NEED MD APPOINTMENT) 09/27/20   Lorretta Harp, MD  ferrous sulfate 325 (65 FE) MG tablet Take 325 mg by mouth in the morning and at bedtime.     [provider]  gabapentin (NEURONTIN) 300 MG capsule Take 300 mg by mouth 3 (three) times daily. 06/09/21   [provider]  glucose blood test strip Check sugars twice daily 01/31/16   Mack Hook, MD  metFORMIN (GLUCOPHAGE) 500 MG tablet Take 500 mg by mouth 2 (two) times daily with a meal. 04/08/20   [provider]  oxyCODONE-acetaminophen (PERCOCET) 10-325 MG tablet Take 1 tablet by mouth every 4 (four) hours as needed for pain. Patient taking differently: Take 1 tablet by mouth every 6 (six) hours as needed for pain. 12/03/20   Eustace Moore, MD    Family History Family History  Problem Relation Age of Onset   Hypertension Sister    Hypothyroidism Sister    Cancer Maternal Grandmother        lung   Hypertension Son    Hypertension Sister    Diabetes Brother        lost toe in 2018 with new diagnosis of DM   Hypertension Son     Social History Social History   Tobacco Use   Smoking status: Every Day    Packs/day: 0.25    Years: 50.00    Total pack years: 12.50    Types: Cigarettes    Start date: 05/31/1968    Passive exposure: Never   Smokeless tobacco: Never   Tobacco comments:    Smoked x50 years, 1 pack would last 3 days. Currently smoking 4 cigarettes per day  Vaping Use   Vaping Use: Never used  Substance Use Topics   Alcohol use: Yes    Alcohol/week: 1.0 standard drink of alcohol    Types: 1 Cans of beer per week    Comment: 1-2 drinks every other day   Drug use: No     Allergies   Zestoretic [lisinopril-hydrochlorothiazide], Codeine, Oxycodone, Ultram [tramadol], and Zocor [simvastatin]   Review of Systems Review of Systems Per HPI  Physical Exam Triage Vital Signs ED Triage Vitals  Enc Vitals Group     BP 04/10/22 1958 (!) 161/83     Pulse Rate 04/10/22 1958 93     Resp 04/10/22 1958 18     Temp 04/10/22 1958 (!) 97.5 F (36.4 C)     Temp src --      SpO2 04/10/22 1958 94 %     Weight --      Height --       Head Circumference --  Peak Flow --      Pain Score 04/10/22 1957 10     Pain Loc --      Pain Edu? --      Excl. in [place]? --    No data found.  Updated Vital Signs BP (!) 161/83   Pulse 93   Temp (!) 97.5 F (36.4 C)   Resp 18   SpO2 94%   Visual Acuity Right Eye Distance:   Left Eye Distance:   Bilateral Distance:    Right Eye Near:   Left Eye Near:    Bilateral Near:     Physical Exam Vitals and nursing note reviewed.  Constitutional:      Appearance: She is not ill-appearing or toxic-appearing.  HENT:     Head: Normocephalic and atraumatic.     Right Ear: Hearing and external ear normal.     Left Ear: Hearing and external ear normal.     Nose: Nose normal.     Mouth/Throat:     Lips: Pink.  Eyes:     General: Lids are normal. Vision grossly intact. Gaze aligned appropriately.     Extraocular Movements: Extraocular movements intact.     Conjunctiva/sclera: Conjunctivae normal.  Cardiovascular:     Rate and Rhythm: Normal rate and regular rhythm.     Heart sounds: Normal heart sounds, S1 normal and S2 normal.  Pulmonary:     Effort: Pulmonary effort is normal. No respiratory distress.     Breath sounds: Normal breath sounds and air entry.  Musculoskeletal:     Right shoulder: Tenderness and bony tenderness present. No swelling, deformity, effusion, laceration or crepitus. Decreased range of motion. Normal strength. Normal pulse.     Left shoulder: Normal.     Right upper arm: Normal.     Left upper arm: Normal.     Cervical back: Neck supple. Tenderness present. No swelling, edema, deformity, erythema, signs of trauma, lacerations, rigidity, spasms, torticollis, bony tenderness or crepitus. Pain with movement present. Decreased range of motion.     Thoracic back: Normal.     Lumbar back: Normal.     Comments: 5/5 grip strength to bilateral upper extremities. Sensation intact to bilateral upper extremities. Right trapezius muscle tightness and tenderness.  No midline tenderness to the C, T, or L spine. Significantly decreased ROM to the right shoulder due to tenderness.   Skin:    General: Skin is warm and dry.     Capillary Refill: Capillary refill takes less than 2 seconds.     Findings: No rash.  Neurological:     General: No focal deficit present.     Mental Status: She is alert and oriented to person, place, and time. Mental status is at baseline.     Cranial Nerves: No dysarthria or facial asymmetry.  Psychiatric:        Mood and Affect: Mood normal.        Speech: Speech normal.        Behavior: Behavior normal.        Thought Content: Thought content normal.        Judgment: Judgment normal.      UC Treatments / Results  Labs (all labs ordered are listed, but only abnormal results are displayed) Labs Reviewed - No data to display  EKG   Radiology No results found.  Procedures Procedures (including critical care time)  Medications Ordered in UC Medications  methylPREDNISolone sodium succinate (SOLU-MEDROL) 125 mg/2 mL injection 80 mg (80  mg Intramuscular Given 04/10/22 2058)    Initial Impression / Assessment and Plan / UC Course  I have reviewed the triage vital signs and the nursing notes.  Pertinent labs & imaging results that were available during my care of the patient were reviewed by me and considered in my medical decision making (see chart for details).   1. Acute pain of right shoulder X-ray of right shoulder is negative for acute bony abnormalities. Suspect muscle spasm etiology of patient's discomfort as the right trapezius muscle is very tender and there is significant pain with movement of the neck and right shoulder. Steroid burst 2m once daily for 5 days sent to pharmacy. 864msolumedrol injection provided in clinic for acute pain and inflammation to the shoulder/neck. Gentle ROM exercises and heat to the neck recommended. Walking referral to orthopedics provided, she is to call orthopedics to  schedule a follow-up appointment for the next 1-2 weeks. Advised that prednisone may raise blood sugars temporarily, must take medicine with food, and no NSAIDs while taking prednisone.   Discussed physical exam and available lab work findings in clinic with patient.  Counseled patient regarding appropriate use of medications and potential side effects for all medications recommended or prescribed today. Discussed red flag signs and symptoms of worsening condition,when to call the PCP office, return to urgent care, and when to seek higher level of care in the emergency department. Patient verbalizes understanding and agreement with plan. All questions answered. Patient discharged in stable condition.    Final Clinical Impressions(s) / UC Diagnoses   Final diagnoses:  Acute pain of right shoulder     Discharge Instructions      Prednisone 2010mnce a day for the next 5 days for pain and inflammation to the shoulder.  Apply heat to the neck and shoulder to reduce muscle spasm.   Perform gentle range of motion exercises.   Follow-up with orthopedic provider soon for reevaluation.   If you develop any new or worsening symptoms or do not improve in the next 2 to 3 days, please return.  If your symptoms are severe, please go to the emergency room.  Follow-up with your primary care provider for further evaluation and management of your symptoms as well as ongoing wellness visits.  I hope you feel better!   ED Prescriptions     Medication Sig Dispense Auth. Provider   predniSONE (DELTASONE) 20 MG tablet Take 1 tablet (20 mg total) by mouth daily for 5 days. 5 tablet StaTalbot GrumblingNP      PDMP not reviewed this encounter.   StaTalbot GrumblingNPNorth [place]/15/24 2056

## 2022-04-12 NOTE — Progress Notes (Addendum)
PCP - Dustin Folks, FNP Cardiologist - Dr. Salomon Fick  PPM/ICD -  Denies  Chest x-ray - End of of Jan 2024 EKG - 03/16/2022 Stress Test - 03/28/2018 ECHO - Denies Cardiac Cath - 12/2011  Sleep Study -  "years" CPAP -  Denies  Fasting Blood Sugar - 106-120 Checks Blood Sugar __1___ times a day  Last dose of GLP1 agonist-  N/a GLP1 instructions: n/a  Blood Thinner Instructions: Continue with Aspirin and Plavix Aspirin Instructions: Continue as perscribed  ERAS Protcol - NPO  COVID TEST- n/a   Anesthesia review:  Yes, carotid artery surgery, sees a cardiologist, Hx of cardiac issues. Cleared by Dr. Salomon Fick, Cardiologist, 03/16/2022.  Patient also seen at Urgent care this week for right shoulder injury/pain.   Patient denies shortness of breath, fever, cough and chest pain at PAT appointment   All instructions explained to the patient, with a verbal understanding of the material. Patient agrees to go over the instructions while at home for a better understanding. Patient also instructed to self quarantine after being tested for COVID-19. The opportunity to ask questions was provided.

## 2022-04-13 ENCOUNTER — Encounter (HOSPITAL_COMMUNITY): Payer: Self-pay

## 2022-04-13 ENCOUNTER — Ambulatory Visit: Payer: Medicare HMO | Admitting: Podiatry

## 2022-04-13 NOTE — Anesthesia Preprocedure Evaluation (Signed)
Anesthesia Evaluation    Airway        Dental   Pulmonary Current Smoker and Patient abstained from smoking.          Cardiovascular hypertension,      Neuro/Psych    GI/Hepatic   Endo/Other  diabetes    Renal/GU      Musculoskeletal   Abdominal   Peds  Hematology   Anesthesia Other Findings   Reproductive/Obstetrics                             Anesthesia Physical Anesthesia Plan  ASA:   Anesthesia Plan:    Post-op Pain Management:    Induction:   PONV Risk Score and Plan:   Airway Management Planned:   Additional Equipment:   Intra-op Plan:   Post-operative Plan:   Informed Consent:   Plan Discussed with:   Anesthesia Plan Comments: (PAT note written 04/13/2022 by Myra Gianotti, PA-C. Leukocytosis in setting of steroids of right should pain. Repeat CBC ordered for day of surgery.   )       Anesthesia Quick Evaluation

## 2022-04-13 NOTE — Progress Notes (Signed)
Anesthesia Chart Review:  Case: [id number] Date/Time: 04/17/22 0715   Procedure: Transcarotid Artery Revascularization (Left)   Anesthesia type: General   Pre-op diagnosis: Stenosis of left carotid artery   Location: [facility] OR ROOM 11 / [place] [facility]   Surgeons: Waynetta Sandy, MD       DISCUSSION: Patient is a 72 year old female scheduled for the above procedure.  History includes smoking, HTN, DM2, HLD, CAD, carotid artery stenosis (s/p right carotid endarterectomy 09/27/11; occluded RICA and [id number] LICA [id number] CTA), PAD (right SFA stent 08/07/21, 10/23/21), OSA (not CPAP following T&A), varicose veins (s/p vein stripping 2008), THR (right 08/22/18), hysterectomy (01/09/99), thyroid nodule.    Preoperative cardiology input outlined 03/16/22 by Eugenio Hoes, NP, "Pre-Operative Cardiac Clearance: Able to complete <4.0 METS. Per Revised Cardiac Risk Index, consider minor  risk with >11% chance of adverse cardiac events perioperatively given history of CAD, CHF, and stroke. However, she is well optimized from a cardiac standpoint. . Therefore, based on ACC/AHA guidelines, patient would be at acceptable risk for the planned procedure without further cardiovascular testing.  Most recent NM stress test 03/27/2018 low risk study.   Recommend stopping ASA and Plavix 5 days prior to surgery and to start again ASAP post operatively at the discretion of surgeon." She is to continue ASA and Plavix.  WBC 20K on 04/12/22. She denied cough and fever. UA showed rare bacteria, 6-10 WBC, no leukocytes or nitrites. She was started on a 5 day course of prednisone 20 mg daily on 04/10/22 for right shoulder pain, so steroids may be contributing to her leukocytosis. [facility] RN Herma Ard was notified of results and recent steroids and will communicate with Dr. Donzetta Matters.   Will repeat CBC on the day of surgery. Surgeon and anesthesia team to review and evaluate patient on the day of surgery.     VS: BP (!) 159/68   Pulse 82   Temp 36.4 C    Resp 16   Ht 5' 3"$  (1.6 m)   Wt 61.2 kg   SpO2 100%   BMI 23.91 kg/m   PROVIDERS: Sonia Side., FNP is PCP  - Quay Burow, MD is cardiologist. Servando Snare, MD is vascular surgeon.  Truitt Merle, MD is HEM (follows for anemia)   LABS: Preoperative labs noted. See DISCUSSION. (all labs ordered are listed, but only abnormal results are displayed)  Labs Reviewed  GLUCOSE, CAPILLARY - Abnormal; Notable for the following components:      Result Value   Glucose-Capillary 160 (*)    All other components within normal limits  HEMOGLOBIN A1C - Abnormal; Notable for the following components:   Hgb A1c MFr Bld 6.4 (*)    All other components within normal limits  CBC - Abnormal; Notable for the following components:   WBC 20.0 (*)    Hemoglobin 11.2 (*)    HCT 34.9 (*)    RDW 17.1 (*)    Platelets 441 (*)    All other components within normal limits  COMPREHENSIVE METABOLIC PANEL - Abnormal; Notable for the following components:   Glucose, Bld 133 (*)    Total Protein 9.0 (*)    Albumin 2.6 (*)    Total Bilirubin 0.1 (*)    All other components within normal limits  URINALYSIS, ROUTINE W REFLEX MICROSCOPIC - Abnormal; Notable for the following components:   APPearance CLOUDY (*)    Ketones, ur 5 (*)    Protein, ur 100 (*)    Bacteria, UA RARE (*)  All other components within normal limits  SURGICAL PCR SCREEN  PROTIME-INR  APTT  TYPE AND SCREEN     IMAGES: US Thyroid 03/08/22: IMPRESSION: 1. 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 Alternatively, given stability for the past 4 years, a follow-up examination performed after 02/2023 would ensure 5 years of stability and thus a benign etiology. 2. Moderate amount of atherosclerotic plaque within the  incidentally imaged bilateral common carotid arteries. Further evaluation with dedicated carotid Doppler ultrasound could be performed as indicated. - The above is in keeping with the ACR TI-RADS recommendations - J Am Coll Radiol 2017;14:587-595.  CT Head/CTA Head & Neck 02/15/22: MPRESSION: 1. 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 2. Stable high-grade stenosis of the left carotid bifurcation of approximately 75%. 3. Stable severe bilateral vertebral artery stenoses at the origins. 4. 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 5. Stable moderate inferior left M3 stenosis. 6. The previously seen posterior right M3 stenosis is not present on today's exam. 7. Stable mild distal small vessel disease without other significant proximal stenosis, aneurysm, or branch vessel occlusion within the Circle of Willis. 8. Stable left thyroid goiter. Recommend thyroid US (ref: J Am Coll Radiol. 2015 Feb;12(2): 143-50). 9. Aortic Atherosclerosis (ICD10-I70.0) and Emphysema (ICD10-J43.9).     EKG: 03/16/2022: Normal sinus rhythm.  Low voltage QRS.   CV: Myocardial Perfusion 03/28/2018 Nuclear stress EF: 68%. The left ventricular ejection fraction is hyperdynamic (>65%). There was no ST segment deviation noted during stress. The study is normal. This is a low risk study. Normal pharmacologic nuclear study with no evidence for a prior infarct or ischemia. Since the prior study in 2013 ischemia is no longer present.     LHC 01/16/2012 LM: Normal in size. 10% distal stenosis LAD: Normal in size. 50% discrete ostial stenosis. In the proximal extending into the midsegment, there is a 50% tubular stenosis followed by a short aneurysmal segment. The rest of the vessel has minor irregularities. D1: 30% proximal stenosis. D2: 30% proximal stenosis. D3: Small with minor irregularities. LCX: Large in  size and dominant. Minor irregularities. 30% proximal stenosis. OM1: Small in size with no significant disease. OM2: Small in size with no significant disease. OM3: Normal in size with diffuse 30% proximal disease. Posterior AV groove: Minor irregularities and gives the PDA and 2 PLA branches. RCA: Small and nondominant. Minor irregularities. Final Conclusions:   1. No evidence of obstructive coronary artery disease. There is moderate LAD disease which is diffuse as well as mild disease in the left circumflex. 2. Normal LV systolic function. 3. Moderate systemic hypertension with mildly elevated LVEDP and mild gradient across the aortic valve.   Past Medical History:  Diagnosis Date   Anemia    Arthritis    Blood transfusion without reported diagnosis    Carotid artery narrowing    Coronary artery disease    Cardiac catheterization November 2013: 50% ostial LAD stenosis 50% mid stenosis. 30% disease in the left circumflex.   Diabetes mellitus, type 2 ([place])    Hyperlipidemia    Hypertension    Onychomycosis of toenail 07/31/2016   PAD (peripheral artery disease) (HCC)    Sleep apnea       Had surgery to correct   Thyroid nodule  Past Surgical History:  Procedure Laterality Date   ABDOMINAL AORTOGRAM W/LOWER EXTREMITY N/A 08/07/2021   Procedure: ABDOMINAL AORTOGRAM W/LOWER EXTREMITY;  Surgeon: Waynetta Sandy, MD;  Location: [place] [facility];  Service: Cardiovascular;  Laterality: N/A;   ABDOMINAL AORTOGRAM W/LOWER EXTREMITY Right 10/23/2021   Procedure: ABDOMINAL AORTOGRAM W/LOWER EXTREMITY;  Surgeon: Waynetta Sandy, MD;  Location: [place] [facility];  Service: Cardiovascular;  Laterality: Right;   ABDOMINAL HYSTERECTOMY     APPLICATION OF INTRAOPERATIVE CT SCAN N/A 11/30/2020   Procedure: APPLICATION OF INTRAOPERATIVE CT SCAN;  Surgeon: Eustace Moore, MD;  Location: [place];  Service: Neurosurgery;  Laterality: N/A;   BACK SURGERY     CARDIAC  CATHETERIZATION     CATARACT EXTRACTION, BILATERAL Bilateral 2021   Dr. Sherral Hammers   ENDARTERECTOMY  09/27/2011   Procedure: RIGT ENDARTERECTOMY CAROTID;  Surgeon: Mal Misty, MD;  Location: [place];  Service: Vascular;  Laterality: Right;   EPIDURAL BLOCK INJECTION  02/2008   Drs. Eulis Manly   gyn surgery  2004   total hysterectomy for mennorhagia,,salpingoophorectomy   LAMINECTOMY WITH POSTERIOR LATERAL ARTHRODESIS LEVEL 2 N/A 11/30/2020   Procedure: LUMBAR FOUR-FIVE, LUMBAR FIVE-SACRAL ONE POSTERIOR LATERAL FUSION WITH REVISION OF LUMBAR ONE-FIVE HARDWARE AND EXTENSION TO SACRAL ONE AND SACRAL TWO;  Surgeon: Eustace Moore, MD;  Location: [place];  Service: Neurosurgery;  Laterality: N/A;   LUMBAR WOUND DEBRIDEMENT N/A 05/25/2020   Procedure: LUMBAR WOUND IRRIGATION AND DEBRIDEMENT;  Surgeon: Eustace Moore, MD;  Location: [place];  Service: Neurosurgery;  Laterality: N/A;   PERIPHERAL VASCULAR INTERVENTION  08/07/2021   Procedure: PERIPHERAL VASCULAR INTERVENTION;  Surgeon: Waynetta Sandy, MD;  Location: [place] [facility];  Service: Cardiovascular;;  Rt SFA   PERIPHERAL VASCULAR INTERVENTION Right 10/23/2021   Procedure: PERIPHERAL VASCULAR INTERVENTION;  Surgeon: Waynetta Sandy, MD;  Location: [place] [facility];  Service: Cardiovascular;  Laterality: Right;  SFA   POSTERIOR LUMBAR FUSION 4 LEVEL N/A 04/25/2020   Procedure: POSTERIOR LUMBAR INTERBODY FUSION LUMBAR ONE-TWO, LUMBAR TWO-THREE, LUMBAR THREE-FOUR,LUMBAR FOUR-FIVE.;  Surgeon: Eustace Moore, MD;  Location: [place];  Service: Neurosurgery;  Laterality: N/A;  posterior   TONSILLECTOMY     TOTAL ABDOMINAL HYSTERECTOMY W/ BILATERAL SALPINGOOPHORECTOMY Bilateral 2000   TOTAL HIP ARTHROPLASTY Right 08/22/2018   Procedure: TOTAL HIP ARTHROPLASTY;  Surgeon: Earlie Server, MD;  Location: [facility];  Service: Orthopedics;  Laterality: Right;   VARICOSE VEIN SURGERY  2008   stripping    MEDICATIONS:  allopurinol  (ZYLOPRIM) 300 MG tablet   amLODipine (NORVASC) 10 MG tablet   aspirin EC 81 MG tablet   atorvastatin (LIPITOR) 80 MG tablet   blood glucose meter kit and supplies KIT   carvedilol (COREG CR) 20 MG 24 hr capsule   clopidogrel (PLAVIX) 75 MG tablet   cyanocobalamin (VITAMIN B12) 1000 MCG tablet   ezetimibe (ZETIA) 10 MG tablet   ferrous sulfate 325 (65 FE) MG tablet   gabapentin (NEURONTIN) 300 MG capsule   glucose blood test strip   metFORMIN (GLUCOPHAGE) 500 MG tablet   oxyCODONE-acetaminophen (PERCOCET) 10-325 MG tablet   predniSONE (DELTASONE) 20 MG tablet   No current facility-administered medications for this encounter.    Myra Gianotti, PA-C Surgical Short Stay/Anesthesiology [facility] Phone [phone] [facility] Phone [phone] 04/13/2022 2:04 PM

## 2022-04-16 NOTE — Telephone Encounter (Signed)
Patient contacted office requesting to cancel TCAR surgery on tomorrow due to she has been having pain in her right shoulder since last Tuesday and wants to get that better before proceeding. Patient states she will callback to reschedule. Dr. Donzetta Matters made aware.

## 2022-04-17 ENCOUNTER — Inpatient Hospital Stay (HOSPITAL_COMMUNITY): Admission: RE | Admit: 2022-04-17 | Payer: Medicare HMO | Source: Home / Self Care | Admitting: Vascular Surgery

## 2022-04-17 DIAGNOSIS — D72829 Elevated white blood cell count, unspecified: Secondary | ICD-10-CM

## 2022-04-17 SURGERY — TRANSCAROTID ARTERY REVASCULARIZATION (TCAR)
Anesthesia: General | Laterality: Left

## 2022-04-19 ENCOUNTER — Emergency Department (HOSPITAL_COMMUNITY)
Admission: EM | Admit: 2022-04-19 | Discharge: 2022-04-20 | Disposition: A | Discharge status: Home / Self Care | Payer: Medicare HMO | Attending: Emergency Medicine | Admitting: Emergency Medicine

## 2022-04-19 ENCOUNTER — Emergency Department (HOSPITAL_COMMUNITY): Payer: Medicare HMO

## 2022-04-19 ENCOUNTER — Other Ambulatory Visit: Payer: Self-pay

## 2022-04-19 DIAGNOSIS — Z7902 Long term (current) use of antithrombotics/antiplatelets: Secondary | ICD-10-CM | POA: Diagnosis not present

## 2022-04-19 DIAGNOSIS — M25551 Pain in right hip: Secondary | ICD-10-CM | POA: Diagnosis present

## 2022-04-19 DIAGNOSIS — M25511 Pain in right shoulder: Secondary | ICD-10-CM | POA: Insufficient documentation

## 2022-04-19 DIAGNOSIS — Z7982 Long term (current) use of aspirin: Secondary | ICD-10-CM | POA: Insufficient documentation

## 2022-04-19 DIAGNOSIS — Z7984 Long term (current) use of oral hypoglycemic drugs: Secondary | ICD-10-CM | POA: Insufficient documentation

## 2022-04-19 DIAGNOSIS — I2510 Atherosclerotic heart disease of native coronary artery without angina pectoris: Secondary | ICD-10-CM | POA: Insufficient documentation

## 2022-04-19 DIAGNOSIS — I10 Essential (primary) hypertension: Secondary | ICD-10-CM | POA: Diagnosis not present

## 2022-04-19 DIAGNOSIS — E871 Hypo-osmolality and hyponatremia: Secondary | ICD-10-CM

## 2022-04-19 DIAGNOSIS — Z79899 Other long term (current) drug therapy: Secondary | ICD-10-CM | POA: Insufficient documentation

## 2022-04-19 DIAGNOSIS — E119 Type 2 diabetes mellitus without complications: Secondary | ICD-10-CM | POA: Diagnosis not present

## 2022-04-19 LAB — CBC
HCT: 34.4 % — ABNORMAL LOW (ref 36.0–46.0)
Hemoglobin: 11.1 g/dL — ABNORMAL LOW (ref 12.0–15.0)
MCH: 26.7 pg (ref 26.0–34.0)
MCHC: 32.3 g/dL (ref 30.0–36.0)
MCV: 82.9 fL (ref 80.0–100.0)
Platelets: 448 10*3/uL — ABNORMAL HIGH (ref 150–400)
RBC: 4.15 MIL/uL (ref 3.87–5.11)
RDW: 16.7 % — ABNORMAL HIGH (ref 11.5–15.5)
WBC: 21.1 10*3/uL — ABNORMAL HIGH (ref 4.0–10.5)
nRBC: 0 % (ref 0.0–0.2)

## 2022-04-19 LAB — BASIC METABOLIC PANEL
Anion gap: 7 (ref 5–15)
BUN: 17 mg/dL (ref 8–23)
CO2: 23 mmol/L (ref 22–32)
Calcium: 8.4 mg/dL — ABNORMAL LOW (ref 8.9–10.3)
Chloride: 99 mmol/L (ref 98–111)
Creatinine, Ser: 0.93 mg/dL (ref 0.44–1.00)
GFR, Estimated: >60 mL/min (ref >60)
Glucose, Bld: 238 mg/dL — ABNORMAL HIGH (ref 70–99)
Potassium: 4.2 mmol/L (ref 3.5–5.1)
Sodium: 129 mmol/L — ABNORMAL LOW (ref 135–145)

## 2022-04-19 LAB — TROPONIN I (HIGH SENSITIVITY): Troponin I (High Sensitivity): 7 ng/L (ref <18)

## 2022-04-19 MED ORDER — OXYCODONE-ACETAMINOPHEN 5-325 MG PO TABS
2.0000 | ORAL_TABLET | Freq: Once | ORAL | Status: AC
  Administered 2022-04-19: 1 via ORAL
  Filled 2022-04-19: qty 2

## 2022-04-19 MED ORDER — DEXAMETHASONE SODIUM PHOSPHATE 10 MG/ML IJ SOLN
10.0000 mg | Freq: Once | INTRAMUSCULAR | Status: AC
  Administered 2022-04-20: 10 mg via INTRAMUSCULAR
  Filled 2022-04-19: qty 1

## 2022-04-19 NOTE — ED Notes (Signed)
Pt states already taking 1 5 mg percocet tablet prior to arrival. PA ordered 2 tablets. Pt requested only 1 tablet of 2 ordered by given.

## 2022-04-19 NOTE — ED Provider Triage Note (Signed)
Emergency Medicine Provider Triage Evaluation Note  April Shaffer , a 72 y.o. female  was evaluated in triage.  Pt complains of shoulder pain and hip pain. Atraumatic pain to R shoulder Radiates to R side of chest and down to hip since yesterday.  Sts her neurologist gave her a shot for her shoulder pain yesterday but it didn't help.  Still having moderate pain.  No fever, sob, n/v/d, numbness, weakness or injury  Review of Systems  Positive: As above Negative: As above  Physical Exam  BP (!) 144/65 (BP Location: Left Arm)   Pulse 79   Temp 98.3 F (36.8 C) (Oral)   Resp 18   SpO2 95%  Gen:   Awake, no distress   Resp:  Normal effort  MSK:   Moves extremities without difficulty  Other:    Medical Decision Making  Medically screening exam initiated at 8:40 PM.  Appropriate orders placed.  ALDIA MOPPIN was informed that the remainder of the evaluation will be completed by another provider, this initial triage assessment does not replace that evaluation, and the importance of remaining in the ED until their evaluation is complete.     Domenic Moras, PA-C 04/19/22 2042

## 2022-04-19 NOTE — ED Provider Notes (Signed)
[facility] Provider Note   CSN: [id number] Arrival date & time: 04/19/22  2002     History  Chief Complaint  Patient presents with   Shoulder Pain   Hip Pain    April Shaffer is a 72 y.o. female.  HPI     This is a 72 year old female who presents with right shoulder and right hip pain.  Patient reports she had onset of atraumatic right shoulder pain last week.  She was seen and evaluated in urgent care.  She was given a 5-day course of prednisone with minimal relief.  She states that previously, she has had burst dose steroids for her right hip which has been helpful but "it was a higher dose."  She saw her neurologist yesterday who gave her a steroid shot with minimal relief.  She is on oxycodone daily for chronic pain.  She denies any fevers.  She denies any injury.  She now states that she is having pain in her right hip.  The pain stays in her hip and does not radiate.  She has a history of hip replacement on the right.  She has been ambulatory but with her walker secondary to pain.  Home Medications Prior to Admission medications   Medication Sig Start Date End Date Taking? Authorizing Provider  methylPREDNISolone (MEDROL DOSEPAK) 4 MG TBPK tablet Take as prescribed on packet 04/20/22  Yes Chrishawna Farina, Barbette Hair, MD  allopurinol (ZYLOPRIM) 300 MG tablet Take 1 tablet (300 mg total) by mouth daily. 07/09/19   Mack Hook, MD  amLODipine (NORVASC) 10 MG tablet Take 10 mg by mouth in the morning.    [provider]  aspirin EC 81 MG tablet Take 81 mg by mouth in the morning.    [provider]  atorvastatin (LIPITOR) 80 MG tablet TAKE 1 TABLET BY MOUTH ONCE DAILY WITH  EVENING  MEAL Patient taking differently: Take 80 mg by mouth in the morning. 07/09/19   Mack Hook, MD  blood glucose meter kit and supplies KIT Check sugars twice daily 06/10/18   Mack Hook, MD  carvedilol (COREG CR) 20 MG 24 hr  capsule Take 1 capsule (20 mg total) by mouth daily. 07/09/19   Mack Hook, MD  clopidogrel (PLAVIX) 75 MG tablet Take 75 mg by mouth in the morning.    [provider]  cyanocobalamin (VITAMIN B12) 1000 MCG tablet Take 1,000 mcg by mouth daily.    [provider]  ezetimibe (ZETIA) 10 MG tablet TAKE 1 TABLET EVERY DAY (NEED MD APPOINTMENT) 09/27/20   Lorretta Harp, MD  ferrous sulfate 325 (65 FE) MG tablet Take 325 mg by mouth in the morning and at bedtime.    [provider]  gabapentin (NEURONTIN) 300 MG capsule Take 300 mg by mouth 3 (three) times daily. 06/09/21   [provider]  glucose blood test strip Check sugars twice daily 01/31/16   Mack Hook, MD  metFORMIN (GLUCOPHAGE) 500 MG tablet Take 500 mg by mouth 2 (two) times daily with a meal. 04/08/20   [provider]  oxyCODONE-acetaminophen (PERCOCET) 10-325 MG tablet Take 1 tablet by mouth every 4 (four) hours as needed for pain. Patient taking differently: Take 1 tablet by mouth every 6 (six) hours as needed for pain. 12/03/20   Eustace Moore, MD      Allergies    Zestoretic [lisinopril-hydrochlorothiazide], Codeine, Oxycodone, Ultram [tramadol], and Zocor [simvastatin]    Review of Systems  Review of Systems  Constitutional:  Negative for fever.  Musculoskeletal:        Right hip and shoulder pain  All other systems reviewed and are negative.   Physical Exam Updated Vital Signs BP (!) 148/72   Pulse 81   Temp 98 F (36.7 C) (Oral)   Resp 16   SpO2 98%  Physical Exam Vitals and nursing note reviewed.  Constitutional:      Appearance: She is well-developed. She is not ill-appearing.  HENT:     Head: Normocephalic and atraumatic.     Nose: Nose normal.     Mouth/Throat:     Mouth: Mucous membranes are moist.  Eyes:     Pupils: Pupils are equal, round, and reactive to light.  Cardiovascular:     Rate and Rhythm: Normal rate and regular rhythm.      Heart sounds: Normal heart sounds.  Pulmonary:     Effort: Pulmonary effort is normal. No respiratory distress.     Breath sounds: No wheezing.  Abdominal:     Palpations: Abdomen is soft.  Musculoskeletal:     Cervical back: Neck supple.     Comments: Normal range of motion right shoulder, no obvious deformities, no redness or erythema, normal range of motion right hip, no obvious deformities, no redness or erythema, prior surgical incision clean dry and intact  Skin:    General: Skin is warm and dry.  Neurological:     Mental Status: She is alert and oriented to person, place, and time.  Psychiatric:        Mood and Affect: Mood normal.     ED Results / Procedures / Treatments   Labs (all labs ordered are listed, but only abnormal results are displayed) Labs Reviewed  BASIC METABOLIC PANEL - Abnormal; Notable for the following components:      Result Value   Sodium 129 (*)    Glucose, Bld 238 (*)    Calcium 8.4 (*)    All other components within normal limits  CBC - Abnormal; Notable for the following components:   WBC 21.1 (*)    Hemoglobin 11.1 (*)    HCT 34.4 (*)    RDW 16.7 (*)    Platelets 448 (*)    All other components within normal limits  TROPONIN I (HIGH SENSITIVITY)  TROPONIN I (HIGH SENSITIVITY)    EKG None  Radiology DG Hip Unilat W or Wo Pelvis 1 View Right  Result Date: 04/19/2022 CLINICAL DATA:  pain; hx of replacement EXAM: DG HIP (WITH OR WITHOUT PELVIS) 1V RIGHT COMPARISON:  X-ray pelvis 01/19/2022 FINDINGS: Total right hip arthroplasty. No radiographic findings suggest surgical hardware complication. No acute displaced fracture or dislocation of the left hip on frontal view. At least moderate degenerative changes left hip. There is no evidence of arthropathy or other focal bone abnormality. Lumbo sacroiliac surgical hardware. Vascular calcifications.  Right thigh vascular stents. IMPRESSION: 1. Negative for acute traumatic injury. 2. Total right hip  arthroplasty. No radiographic findings suggest surgical hardware complication. Electronically Signed   By: Iven Finn M.D.   On: 04/19/2022 23:52   DG Chest Port 1 View  Result Date: 04/19/2022 CLINICAL DATA:  Upper chest pain. EXAM: PORTABLE CHEST 1 VIEW COMPARISON:  PA Lat 04/21/2020 FINDINGS: The cardiomediastinal silhouette and vasculature are normal except for calcification in the aortic arch. There is left infrahilar linear scarring or atelectasis. The lungs are clear of infiltrates. Mild elevation of the right diaphragm is again seen.  The sulci are sharp. There is thoracic spondylosis. IMPRESSION: No evidence of acute cardiopulmonary disease. Left infrahilar linear scarring or atelectasis. Aortic atherosclerosis. Electronically Signed   By: Telford Nab M.D.   On: 04/19/2022 21:00    Procedures Procedures    Medications Ordered in ED Medications  oxyCODONE-acetaminophen (PERCOCET/ROXICET) 5-325 MG per tablet 2 tablet (1 tablet Oral Given 04/19/22 2112)  dexamethasone (DECADRON) injection 10 mg (10 mg Intramuscular Given 04/20/22 0001)  HYDROmorphone (DILAUDID) injection 1 mg (1 mg Intramuscular Given 04/20/22 0117)    ED Course/ Medical Decision Making/ A&P                             Medical Decision Making Amount and/or Complexity of Data Reviewed Radiology: ordered.  Risk Prescription drug management.   This patient presents to the ED for concern of hip pain, shoulder pain, this involves an extensive number of treatment options, and is a complaint that carries with it a high risk of complications and morbidity.  I considered the following differential and admission for this acute, potentially life threatening condition.  The differential diagnosis includes arthritis, radiculopathy, muscle spasm, Shaffer likely septic arthritis  MDM:    This is a 72 year old female who presents with primarily hip pain today although she is recently had some shoulder pain.  She states that  she feels like her pain went from her shoulder to her hip.  She has seen urgent care and a neurologist.  She got an injection in her shoulder but then subsequently developed worsening right hip pain.  She has not had any fevers.  No radicular symptoms.  She has no external deformities or signs of trauma.  There is no redness or erythema and she is without fever which would make septic arthritis Shaffer likely.  X-rays of the right hip show well-placed right hip replacement.  Could have some arthritis.  Reports prior improvement with steroids.  Her labs were obtained and reviewed.  She has some mild hyponatremia at 129.  Additionally, she has a leukocytosis but recently finished a course of steroids.  She denies infectious symptoms.  (Labs, imaging, consults)  Labs: I Ordered, and personally interpreted labs.  The pertinent results include: CBC, BMP  Imaging Studies ordered: I ordered imaging studies including chest x-ray I independently visualized and interpreted imaging. I agree with the radiologist interpretation  Additional history obtained from husband at bedside.  External records from outside source obtained and reviewed including urgent care notes  Cardiac Monitoring: The patient was not maintained on a cardiac monitor.  If on the cardiac monitor, I personally viewed and interpreted the cardiac monitored which showed an underlying rhythm of: n/a  Reevaluation: After the interventions noted above, I reevaluated the patient and found that they have :improved  Social Determinants of Health:  lives independently  Disposition: Discharge  Co morbidities that complicate the patient evaluation  Past Medical History:  Diagnosis Date   Anemia    Arthritis    Blood transfusion without reported diagnosis    Carotid artery narrowing    Coronary artery disease    Cardiac catheterization November 2013: 50% ostial LAD stenosis 50% mid stenosis. 30% disease in the left circumflex.   Diabetes  mellitus, type 2 ([place])    Hyperlipidemia    Hypertension    Onychomycosis of toenail 07/31/2016   PAD (peripheral artery disease) (HCC)    Sleep apnea       Had surgery to  correct   Thyroid nodule      Medicines Meds ordered this encounter  Medications   oxyCODONE-acetaminophen (PERCOCET/ROXICET) 5-325 MG per tablet 2 tablet   dexamethasone (DECADRON) injection 10 mg   HYDROmorphone (DILAUDID) injection 1 mg   methylPREDNISolone (MEDROL DOSEPAK) 4 MG TBPK tablet    Sig: Take as prescribed on packet    Dispense:  21 tablet    Refill:  0    I have reviewed the patients home medicines and have made adjustments as needed  Problem List / ED Course: Problem List Items Addressed This Visit   None Visit Diagnoses     Right hip pain    -  Primary                   Final Clinical Impression(s) / ED Diagnoses Final diagnoses:  Right hip pain    Rx / DC Orders ED Discharge Orders          Ordered    methylPREDNISolone (MEDROL DOSEPAK) 4 MG TBPK tablet        04/20/22 0115              Merryl Hacker, MD 04/20/22 0121

## 2022-04-19 NOTE — ED Triage Notes (Signed)
Pt c/o right hip pain and shoulder pain. States pain ongoing since Valentines day. Denies recent falls. Has had negative XR shoulder at urgent care on 2/14. No XRs of right hip. Follow up neurologist that provided pt with a "shot" that offered some relief on Tuesday. Has been prescribed oxycodone and muscle relaxer without relief. Pt presents to ED today requesting medication for pain management.

## 2022-04-20 DIAGNOSIS — M25551 Pain in right hip: Secondary | ICD-10-CM | POA: Diagnosis not present

## 2022-04-20 MED ORDER — HYDROMORPHONE HCL 1 MG/ML IJ SOLN
1.0000 mg | Freq: Once | INTRAMUSCULAR | Status: AC
  Administered 2022-04-20: 1 mg via INTRAMUSCULAR
  Filled 2022-04-20: qty 1

## 2022-04-20 MED ORDER — METHYLPREDNISOLONE 4 MG PO TBPK: ORAL_TABLET | ORAL | 0 refills | Status: DC

## 2022-04-20 NOTE — Discharge Instructions (Addendum)
You are seen today primarily for hip pain.  Your x-rays are largely reassuring.  You were given a dose of long-acting steroid.  Continue your pain medication as prescribed.  You may apply heat or ice.  You may benefit from physical therapy.  Follow-up with your orthopedist.  Start Medrol Dosepak on Saturday.  Of note, your labs were notable for a slightly low sodium.  Make sure that you are drinking electrolyte rich fluids and have enough salt in your diet.  Follow-up for recheck with your primary doctor.

## 2022-04-23 ENCOUNTER — Other Ambulatory Visit: Payer: Self-pay

## 2022-04-23 ENCOUNTER — Emergency Department (HOSPITAL_COMMUNITY): Payer: Medicare HMO

## 2022-04-23 ENCOUNTER — Emergency Department (HOSPITAL_COMMUNITY)
Admission: EM | Admit: 2022-04-23 | Discharge: 2022-04-23 | Disposition: A | Discharge status: Home / Self Care | Payer: Medicare HMO | Attending: Emergency Medicine | Admitting: Emergency Medicine

## 2022-04-23 ENCOUNTER — Encounter (HOSPITAL_COMMUNITY): Payer: Self-pay

## 2022-04-23 DIAGNOSIS — M7918 Myalgia, other site: Secondary | ICD-10-CM | POA: Diagnosis not present

## 2022-04-23 DIAGNOSIS — J449 Chronic obstructive pulmonary disease, unspecified: Secondary | ICD-10-CM | POA: Diagnosis not present

## 2022-04-23 DIAGNOSIS — E1165 Type 2 diabetes mellitus with hyperglycemia: Secondary | ICD-10-CM | POA: Insufficient documentation

## 2022-04-23 DIAGNOSIS — Z79899 Other long term (current) drug therapy: Secondary | ICD-10-CM | POA: Diagnosis not present

## 2022-04-23 DIAGNOSIS — D72829 Elevated white blood cell count, unspecified: Secondary | ICD-10-CM | POA: Diagnosis not present

## 2022-04-23 DIAGNOSIS — R109 Unspecified abdominal pain: Secondary | ICD-10-CM | POA: Diagnosis not present

## 2022-04-23 DIAGNOSIS — M791 Myalgia, unspecified site: Secondary | ICD-10-CM

## 2022-04-23 DIAGNOSIS — I2510 Atherosclerotic heart disease of native coronary artery without angina pectoris: Secondary | ICD-10-CM | POA: Diagnosis not present

## 2022-04-23 DIAGNOSIS — R0789 Other chest pain: Secondary | ICD-10-CM

## 2022-04-23 DIAGNOSIS — Z7982 Long term (current) use of aspirin: Secondary | ICD-10-CM | POA: Insufficient documentation

## 2022-04-23 DIAGNOSIS — E871 Hypo-osmolality and hyponatremia: Secondary | ICD-10-CM | POA: Insufficient documentation

## 2022-04-23 DIAGNOSIS — R739 Hyperglycemia, unspecified: Secondary | ICD-10-CM

## 2022-04-23 DIAGNOSIS — Z7952 Long term (current) use of systemic steroids: Secondary | ICD-10-CM | POA: Diagnosis not present

## 2022-04-23 DIAGNOSIS — Z7902 Long term (current) use of antithrombotics/antiplatelets: Secondary | ICD-10-CM | POA: Diagnosis not present

## 2022-04-23 DIAGNOSIS — I10 Essential (primary) hypertension: Secondary | ICD-10-CM | POA: Diagnosis not present

## 2022-04-23 DIAGNOSIS — Z7984 Long term (current) use of oral hypoglycemic drugs: Secondary | ICD-10-CM | POA: Insufficient documentation

## 2022-04-23 LAB — URINALYSIS, ROUTINE W REFLEX MICROSCOPIC
Bilirubin Urine: NEGATIVE
Glucose, UA: 50 mg/dL — AB
Ketones, ur: NEGATIVE mg/dL
Leukocytes,Ua: NEGATIVE
Nitrite: NEGATIVE
Protein, ur: 30 mg/dL — AB
Specific Gravity, Urine: 1.011 (ref 1.005–1.030)
pH: 7 (ref 5.0–8.0)

## 2022-04-23 LAB — CBC WITH DIFFERENTIAL/PLATELET
Abs Immature Granulocytes: 0.37 10*3/uL — ABNORMAL HIGH (ref 0.00–0.07)
Basophils Absolute: 0 10*3/uL (ref 0.0–0.1)
Basophils Relative: 0 %
Eosinophils Absolute: 0 10*3/uL (ref 0.0–0.5)
Eosinophils Relative: 0 %
HCT: 35.7 % — ABNORMAL LOW (ref 36.0–46.0)
Hemoglobin: 11.9 g/dL — ABNORMAL LOW (ref 12.0–15.0)
Immature Granulocytes: 2 %
Lymphocytes Relative: 9 %
Lymphs Abs: 2 10*3/uL (ref 0.7–4.0)
MCH: 26.9 pg (ref 26.0–34.0)
MCHC: 33.3 g/dL (ref 30.0–36.0)
MCV: 80.8 fL (ref 80.0–100.0)
Monocytes Absolute: 1.5 10*3/uL — ABNORMAL HIGH (ref 0.1–1.0)
Monocytes Relative: 7 %
Neutro Abs: 18.6 10*3/uL — ABNORMAL HIGH (ref 1.7–7.7)
Neutrophils Relative %: 82 %
Platelets: 461 10*3/uL — ABNORMAL HIGH (ref 150–400)
RBC: 4.42 MIL/uL (ref 3.87–5.11)
RDW: 16.4 % — ABNORMAL HIGH (ref 11.5–15.5)
WBC: 22.6 10*3/uL — ABNORMAL HIGH (ref 4.0–10.5)
nRBC: 0 % (ref 0.0–0.2)

## 2022-04-23 LAB — BASIC METABOLIC PANEL
Anion gap: 11 (ref 5–15)
BUN: 14 mg/dL (ref 8–23)
CO2: 25 mmol/L (ref 22–32)
Calcium: 9 mg/dL (ref 8.9–10.3)
Chloride: 91 mmol/L — ABNORMAL LOW (ref 98–111)
Creatinine, Ser: 0.84 mg/dL (ref 0.44–1.00)
GFR, Estimated: >60 mL/min (ref >60)
Glucose, Bld: 313 mg/dL — ABNORMAL HIGH (ref 70–99)
Potassium: 3.6 mmol/L (ref 3.5–5.1)
Sodium: 127 mmol/L — ABNORMAL LOW (ref 135–145)

## 2022-04-23 LAB — TROPONIN I (HIGH SENSITIVITY)
Troponin I (High Sensitivity): 11 ng/L (ref <18)
Troponin I (High Sensitivity): 11 ng/L (ref <18)

## 2022-04-23 LAB — CK: Total CK: <5 U/L — ABNORMAL LOW (ref 38–234)

## 2022-04-23 MED ORDER — HYDRALAZINE HCL 20 MG/ML IJ SOLN
10.0000 mg | Freq: Once | INTRAMUSCULAR | Status: AC
  Administered 2022-04-23: 10 mg via INTRAVENOUS
  Filled 2022-04-23: qty 1

## 2022-04-23 MED ORDER — ONDANSETRON HCL 4 MG/2ML IJ SOLN
4.0000 mg | Freq: Once | INTRAMUSCULAR | Status: AC
  Administered 2022-04-23: 4 mg via INTRAVENOUS
  Filled 2022-04-23: qty 2

## 2022-04-23 MED ORDER — IOHEXOL 350 MG/ML SOLN
75.0000 mL | Freq: Once | INTRAVENOUS | Status: AC | PRN
  Administered 2022-04-23: 75 mL via INTRAVENOUS

## 2022-04-23 MED ORDER — SODIUM CHLORIDE 0.9 % IV BOLUS
1000.0000 mL | Freq: Once | INTRAVENOUS | Status: AC
  Administered 2022-04-23: 1000 mL via INTRAVENOUS

## 2022-04-23 MED ORDER — MORPHINE SULFATE (PF) 4 MG/ML IV SOLN
4.0000 mg | Freq: Once | INTRAVENOUS | Status: AC
  Administered 2022-04-23: 4 mg via INTRAVENOUS
  Filled 2022-04-23: qty 1

## 2022-04-23 NOTE — ED Notes (Signed)
Patient arrived to er from home via ems c/o chest pain that started on valentines day . Patient reports she also has back abdominal and hip pain. Pt took percocet and 324asa and was given 1 sl nitro via ems enroute to er

## 2022-04-23 NOTE — ED Provider Notes (Signed)
[facility] Provider Note   CSN: [id number] Arrival date & time: 04/23/22  0008     History  Chief Complaint  Patient presents with   Chest Pain    Patient arrived via ems from home c/o chest and hip pain that began on [place] day and has gotten worse. Pt reports unrelieved by 324 asa 1 sl nitro and her home pain meds    April Shaffer is a 72 y.o. female.  HPI    This is a 72 year old female with history of diabetes, hypertension, hyperlipidemia who presents with chest pain.  Upon my evaluation she states "I hurt all over."  She reports that she has been hurting since Valentine's Day.  Of note I saw her 3 days ago for hip and shoulder pain.  She states she continues to have that but is not having chest pain as well.  She states that none of her medications are working.  She denies any exertional symptoms.  She denies any recent fevers, cough, shortness of breath.  Patient also endorses nausea.  No vomiting. Home Medications Prior to Admission medications   Medication Sig Start Date End Date Taking? Authorizing Provider  allopurinol (ZYLOPRIM) 300 MG tablet Take 1 tablet (300 mg total) by mouth daily. 07/09/19   Mack Hook, MD  amLODipine (NORVASC) 10 MG tablet Take 10 mg by mouth in the morning.    [provider]  aspirin EC 81 MG tablet Take 81 mg by mouth in the morning.    [provider]  atorvastatin (LIPITOR) 80 MG tablet TAKE 1 TABLET BY MOUTH ONCE DAILY WITH  EVENING  MEAL Patient taking differently: Take 80 mg by mouth in the morning. 07/09/19   Mack Hook, MD  blood glucose meter kit and supplies KIT Check sugars twice daily 06/10/18   Mack Hook, MD  carvedilol (COREG CR) 20 MG 24 hr capsule Take 1 capsule (20 mg total) by mouth daily. 07/09/19   Mack Hook, MD  clopidogrel (PLAVIX) 75 MG tablet Take 75 mg by mouth in the morning.    [provider]  cyanocobalamin  (VITAMIN B12) 1000 MCG tablet Take 1,000 mcg by mouth daily.    [provider]  ezetimibe (ZETIA) 10 MG tablet TAKE 1 TABLET EVERY DAY (NEED MD APPOINTMENT) 09/27/20   Lorretta Harp, MD  ferrous sulfate 325 (65 FE) MG tablet Take 325 mg by mouth in the morning and at bedtime.    [provider]  gabapentin (NEURONTIN) 300 MG capsule Take 300 mg by mouth 3 (three) times daily. 06/09/21   [provider]  glucose blood test strip Check sugars twice daily 01/31/16   Mack Hook, MD  metFORMIN (GLUCOPHAGE) 500 MG tablet Take 500 mg by mouth 2 (two) times daily with a meal. 04/08/20   [provider]  methylPREDNISolone (MEDROL DOSEPAK) 4 MG TBPK tablet Take as prescribed on packet 04/20/22   Deya Bigos, Barbette Hair, MD  oxyCODONE-acetaminophen (PERCOCET) 10-325 MG tablet Take 1 tablet by mouth every 4 (four) hours as needed for pain. Patient taking differently: Take 1 tablet by mouth every 6 (six) hours as needed for pain. 12/03/20   Eustace Moore, MD      Allergies    Zestoretic [lisinopril-hydrochlorothiazide], Codeine, Oxycodone, Ultram [tramadol], and Zocor [simvastatin]    Review of Systems   Review of Systems  Constitutional:  Negative for fever.  Respiratory:  Negative for shortness of breath.   Cardiovascular:  Positive for chest pain.  Gastrointestinal:  Negative for abdominal pain.  Musculoskeletal:  Positive for myalgias.  All other systems reviewed and are negative.   Physical Exam Updated Vital Signs BP (!) 183/85 (BP Location: Right Arm)   Pulse 90   Temp 98.2 F (36.8 C) (Oral)   Resp 16   Ht 1.6 m ('5\' 3"'$ )   Wt 61.2 kg   SpO2 97%   BMI 23.91 kg/m  Physical Exam Vitals and nursing note reviewed.  Constitutional:      Appearance: She is well-developed.     Comments: Thin, chronically ill-appearing, nontoxic  HENT:     Head: Normocephalic and atraumatic.  Eyes:     Pupils: Pupils are equal, round, and reactive to light.   Cardiovascular:     Rate and Rhythm: Normal rate and regular rhythm.     Heart sounds: Normal heart sounds.  Pulmonary:     Effort: Pulmonary effort is normal. No respiratory distress.     Breath sounds: No wheezing.  Abdominal:     General: Bowel sounds are normal.     Palpations: Abdomen is soft.  Musculoskeletal:     Cervical back: Neck supple.     Right lower leg: No tenderness. No edema.     Left lower leg: No tenderness. No edema.  Skin:    General: Skin is warm and dry.  Neurological:     Mental Status: She is alert and oriented to person, place, and time.  Psychiatric:        Mood and Affect: Mood normal.     ED Results / Procedures / Treatments   Labs (all labs ordered are listed, but only abnormal results are displayed) Labs Reviewed  CBC WITH DIFFERENTIAL/PLATELET - Abnormal; Notable for the following components:      Result Value   WBC 22.6 (*)    Hemoglobin 11.9 (*)    HCT 35.7 (*)    RDW 16.4 (*)    Platelets 461 (*)    Neutro Abs 18.6 (*)    Monocytes Absolute 1.5 (*)    Abs Immature Granulocytes 0.37 (*)    All other components within normal limits  BASIC METABOLIC PANEL - Abnormal; Notable for the following components:   Sodium 127 (*)    Chloride 91 (*)    Glucose, Bld 313 (*)    All other components within normal limits  CK - Abnormal; Notable for the following components:   Total CK <5 (*)    All other components within normal limits  URINALYSIS, ROUTINE W REFLEX MICROSCOPIC - Abnormal; Notable for the following components:   APPearance HAZY (*)    Glucose, UA 50 (*)    Hgb urine dipstick SMALL (*)    Protein, ur 30 (*)    Bacteria, UA RARE (*)    All other components within normal limits  TROPONIN I (HIGH SENSITIVITY)  TROPONIN I (HIGH SENSITIVITY)    EKG None  Radiology CT ABDOMEN PELVIS WO CONTRAST  Result Date: 04/23/2022 CLINICAL DATA:  72 year old female with abdominal pain, nausea vomiting. EXAM: CT ABDOMEN AND PELVIS WITHOUT  CONTRAST TECHNIQUE: Multidetector CT imaging of the abdomen and pelvis was performed following the standard protocol without IV contrast. RADIATION DOSE REDUCTION: This exam was performed according to the departmental dose-optimization program which includes automated exposure control, adjustment of the mA and/or kV according to patient size and/or use of iterative reconstruction technique. COMPARISON:  CTA chest 0328 hours today. CT Abdomen and Pelvis 05/27/2020. FINDINGS:  Lower chest: Mildly improved lung volumes from the CTA chest earlier today (please see that report) no pericardial or pleural effusion. Hepatobiliary: Negative largely noncontrast liver and gallbladder. Pancreas: Negative. Spleen: Negative. Adrenals/Urinary Tract: Normal adrenal glands. Symmetric renal contrast excretion to the collecting systems and ureters. Superimposed chronic and benign appearing renal cysts including parapelvic cysts on the left (no follow-up imaging recommended). Decompressed ureters. Excreted oral contrast throughout the urinary bladder. Stomach/Bowel: Decompressed large bowel from the distal transverse colon on word. Redundant transverse colon with mild retained stool. Negative right colon. Negative appendix series 3, image 51. Negative terminal ileum. No dilated small bowel. Negative stomach and duodenum. No free air or free fluid identified. Vascular/Lymphatic: Extensive Aortoiliac calcified atherosclerosis. Normal caliber abdominal aorta. No residual intravascular contrast to evaluate for patency. No lymphadenopathy identified. Reproductive: Chronically absent uterus, diminutive or absent ovaries. Other: No pelvis free fluid. Musculoskeletal: Diffuse idiopathic skeletal hyperostosis (DISH). Lower thoracic through upper lumbar interbody ankylosis via flowing endplate osteophytes and superimposed lumbosacral posterior and interbody chronic spinal fusion hardware. Bilateral pedicle screw loosening since 2022 at the L1 and  L2 vertebral levels. L2-L3 pseudoarthrosis. L5-S1 pseudoarthrosis and loosening of bilateral S1 and iliac bone screws which were not present in 2022. Right hip arthroplasty.  Left hip degeneration. No superimposed acute osseous abnormality identified. IMPRESSION: 1. No acute or inflammatory process identified in the largely non-contrast abdomen or pelvis. 2. 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 But loosening of the L1, S2, S1, and pelvic bone screws. And pseudoarthrosis at L2-L3 and L5-S1. 3. Advanced  Aortic Atherosclerosis (ICD10-I70.0). Electronically Signed   By: Genevie Ann M.D.   On: 04/23/2022 05:18   CT Angio Chest PE W and/or Wo Contrast  Result Date: 04/23/2022 CLINICAL DATA:  Chest pains. EXAM: CT ANGIOGRAPHY CHEST WITH CONTRAST TECHNIQUE: Multidetector CT imaging of the chest was performed using the standard protocol during bolus administration of intravenous contrast. Multiplanar CT image reconstructions and MIPs were obtained to evaluate the vascular anatomy. RADIATION DOSE REDUCTION: This exam was performed according to the departmental dose-optimization program which includes automated exposure control, adjustment of the mA and/or kV according to patient size and/or use of iterative reconstruction technique. CONTRAST:  66m OMNIPAQUE IOHEXOL 350 MG/ML SOLN COMPARISON:  CTA head and neck 02/15/2022, portable chest today, portable chest 04/19/2022. FINDINGS: Cardiovascular: Pulmonary arteries are normal caliber without visible embolic filling defects. The heart is slightly enlarged. There is no pericardial effusion. There is three-vessel coronary artery calcification heaviest in the LAD and circumflex arteries. There is moderate atherosclerosis in the aorta and great vessels without aneurysm, stenosis or dissection. Pulmonary veins are normal caliber. Mediastinum/Nodes: Again, a heterogeneous mass replaces the  left lobe of the thyroid displacing the trachea to the right, unchanged in size at 4.3 x 3.4 cm, with dystrophic calcifications at the posterior aspect. Per the ultrasound report of 03/08/2022, biopsy is recommended unless already done. No axillary or intrathoracic adenopathy is seen. The thoracic trachea and thoracic esophagus are unremarkable. Lungs/Pleura: Again noted eventration and moderate elevation of the right hemidiaphragm. There is diffuse bronchial thickening. There is mild centrilobular and paraseptal emphysematous disease in the upper lobes. There is linear scarring or atelectasis in the left lower lobe posterior base. No confluent pulmonary infiltrates or nodules are seen. There is no pleural effusion or pneumothorax. Upper Abdomen: No acute abnormality. 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 No suspicious bone lesions or chest wall abnormality. Review of the MIP  images confirms the above findings. IMPRESSION: 1. No evidence of arterial dilatation or embolus. 2. Aortic and coronary artery atherosclerosis. 3. Mild cardiomegaly. 4. COPD. 5. 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 Per the ultrasound report of 03/08/2022, biopsy is recommended unless already done. 6. Extensive spinal bridging enthesopathy. 7. Elevated right hemidiaphragm. Aortic Atherosclerosis (ICD10-I70.0) and Emphysema (ICD10-J43.9). Electronically Signed   By: Telford Nab M.D.   On: 04/23/2022 04:20   DG Chest Portable 1 View  Result Date: 04/23/2022 CLINICAL DATA:  Chest pain, vomiting EXAM: PORTABLE CHEST 1 VIEW COMPARISON:  04/19/2022 FINDINGS: Minimal left basilar linear scarring. Lungs are otherwise clear. No pneumothorax or pleural effusion. Cardiac size within normal limits. Pulmonary vascularity is normal. No acute bone abnormality. IMPRESSION: No active disease. Electronically Signed   By: Fidela Salisbury M.D.   On: 04/23/2022 00:46    Procedures Procedures     Medications Ordered in ED Medications  sodium chloride 0.9 % bolus 1,000 mL (0 mLs Intravenous Stopped 04/23/22 0247)  morphine (PF) 4 MG/ML injection 4 mg (4 mg Intravenous Given 04/23/22 0303)  ondansetron (ZOFRAN) injection 4 mg (4 mg Intravenous Given 04/23/22 0302)  hydrALAZINE (APRESOLINE) injection 10 mg (10 mg Intravenous Given 04/23/22 0412)  iohexol (OMNIPAQUE) 350 MG/ML injection 75 mL (75 mLs Intravenous Contrast Given 04/23/22 0352)    ED Course/ Medical Decision Making/ A&P Clinical Course as of 04/23/22 0657  Mon Apr 23, 2022  [id number] Patient continues to feel nauseated.  Will add CT ab/pelvis [CH]    Clinical Course User Index [CH] Lilia Letterman, Barbette Hair, MD                             Medical Decision Making Amount and/or Complexity of Data Reviewed Labs: ordered. Radiology: ordered.  Risk Prescription drug management.   This patient presents to the ED for concern of multiple complaints, this involves an extensive number of treatment options, and is a complaint that carries with it a high risk of complications and morbidity.  I considered the following differential and admission for this acute, potentially life threatening condition.  The differential diagnosis includes atypical ACS, pneumothorax, pneumonia, rhabdomyolysis  MDM:    This is a 72 year old female who presents with multiple complaints.  She is complaining primarily today of chest pain but also reports pain all over.  Recently was seen and evaluated for shoulder and hip pain.  She is notably hypertensive but nontoxic.  She is chronically ill-appearing.  She is also reporting nausea.  Labs obtained and reviewed.  Initial EKG shows no evidence of acute ischemia.  Troponins x 2 is negative.  Doubt primary ACS.  No evidence of UTI.  CBC notable for persistent leukocytosis.  She has had ongoing steroid treatment for her shoulder and hip pain.  Additionally she is hyponatremic and hyperglycemic.  Hyperglycemia is  likely related to ongoing steroid use and hyponatremia is likely partially due to hyperglycemia.  This corrects to a sodium of approximately 130.  She was given fluids.  CK is normal.  CT scan does not show any evidence of PE.  CT abdomen pelvis shows no intra-abdominal pathology to suggest obstruction.  Patient has had a significant workup.  She can continues to complain of vague symptoms mostly pain all over.  I discussed with her that she should stop taking her steroids and follow-up closely with her primary physician.  At this time do not feel she  warrants admission.  There does not appear to be an acute emergent process.  Of note she was notably hypertensive.  She was given 1 dose of blood pressure medication and this downtrended.  No signs or symptoms of hypertensive urgency or emergency.  I encouraged her to take her blood pressure medications and follow-up for recheck with her primary physician.  (Labs, imaging, consults)  Labs: I Ordered, and personally interpreted labs.  The pertinent results include: CBC, BMP, troponin x 2, CK, urinalysis  Imaging Studies ordered: I ordered imaging studies including CT chest and abdomen, chest x-ray I independently visualized and interpreted imaging. I agree with the radiologist interpretation  Additional history obtained from chart review.  External records from outside source obtained and reviewed including prior evaluations  Cardiac Monitoring: The patient was maintained on a cardiac monitor.  If on the cardiac monitor, I personally viewed and interpreted the cardiac monitored which showed an underlying rhythm of: Sinus rhythm  Reevaluation: After the interventions noted above, I reevaluated the patient and found that they have :stayed the same  Social Determinants of Health:  lives independently  Disposition: Discharge  Co morbidities that complicate the patient evaluation  Past Medical History:  Diagnosis Date   Anemia    Arthritis     Blood transfusion without reported diagnosis    Carotid artery narrowing    Coronary artery disease    Cardiac catheterization November 2013: 50% ostial LAD stenosis 50% mid stenosis. 30% disease in the left circumflex.   Diabetes mellitus, type 2 ([place])    Hyperlipidemia    Hypertension    Onychomycosis of toenail 07/31/2016   PAD (peripheral artery disease) ([facility])    Sleep apnea       Had surgery to correct   Thyroid nodule      Medicines Meds ordered this encounter  Medications   sodium chloride 0.9 % bolus 1,000 mL   morphine (PF) 4 MG/ML injection 4 mg   ondansetron (ZOFRAN) injection 4 mg   hydrALAZINE (APRESOLINE) injection 10 mg   iohexol (OMNIPAQUE) 350 MG/ML injection 75 mL    I have reviewed the patients home medicines and have made adjustments as needed  Problem List / ED Course: Problem List Items Addressed This Visit   None Visit Diagnoses     Hypertension, unspecified type    -  Primary   Relevant Medications   hydrALAZINE (APRESOLINE) injection 10 mg (Completed)   Atypical chest pain       Myalgia       Hyperglycemia                       Final Clinical Impression(s) / ED Diagnoses Final diagnoses:  Hypertension, unspecified type  Atypical chest pain  Myalgia  Hyperglycemia    Rx / DC Orders ED Discharge Orders     None         Merryl Hacker, MD 04/23/22 [phone]

## 2022-04-23 NOTE — Discharge Instructions (Signed)
You were seen today for multiple complaints.  You have had an extensive workup at this point which has been largely unrevealing.  Given your recent steroid use, your glucose was noted to be elevated and your sodium was subsequently low.  You need to make sure that you are monitoring your glucose closely.  Make sure that you are taking your metformin.  Make sure that you are staying hydrated.  Follow-up closely with your primary doctor for recheck.  You also need recheck of your blood pressure.  You should make sure that you are taking your blood pressure medications daily as prescribed.

## 2022-04-24 ENCOUNTER — Other Ambulatory Visit (HOSPITAL_COMMUNITY): Payer: Self-pay | Admitting: Orthopedic Surgery

## 2022-04-24 DIAGNOSIS — T84030A Mechanical loosening of internal right hip prosthetic joint, initial encounter: Secondary | ICD-10-CM

## 2022-04-27 ENCOUNTER — Ambulatory Visit: Payer: Medicare HMO | Admitting: Podiatry

## 2022-04-30 ENCOUNTER — Encounter (HOSPITAL_COMMUNITY)
Admission: RE | Admit: 2022-04-30 | Discharge: 2022-04-30 | Disposition: A | Discharge status: Home / Self Care | Payer: Medicare HMO | Source: Ambulatory Visit | Attending: Orthopedic Surgery | Admitting: Orthopedic Surgery

## 2022-04-30 DIAGNOSIS — T84030A Mechanical loosening of internal right hip prosthetic joint, initial encounter: Secondary | ICD-10-CM | POA: Diagnosis not present

## 2022-04-30 MED ORDER — TECHNETIUM TC 99M MEDRONATE IV KIT
20.0000 | PACK | Freq: Once | INTRAVENOUS | Status: AC | PRN
  Administered 2022-04-30: 21.2 via INTRAVENOUS

## 2022-05-07 ENCOUNTER — Inpatient Hospital Stay (HOSPITAL_COMMUNITY): Payer: Medicare HMO

## 2022-05-07 ENCOUNTER — Encounter (HOSPITAL_COMMUNITY): Payer: Self-pay

## 2022-05-07 ENCOUNTER — Emergency Department (HOSPITAL_COMMUNITY): Payer: Medicare HMO

## 2022-05-07 ENCOUNTER — Other Ambulatory Visit: Payer: Self-pay

## 2022-05-07 ENCOUNTER — Inpatient Hospital Stay (HOSPITAL_COMMUNITY)
Admission: EM | Admit: 2022-05-07 | Discharge: 2022-05-11 | DRG: 512 | Disposition: A | Discharge status: Home Health | Payer: Medicare HMO | Attending: Internal Medicine | Admitting: Internal Medicine

## 2022-05-07 DIAGNOSIS — Z981 Arthrodesis status: Secondary | ICD-10-CM | POA: Diagnosis not present

## 2022-05-07 DIAGNOSIS — Z833 Family history of diabetes mellitus: Secondary | ICD-10-CM

## 2022-05-07 DIAGNOSIS — Z801 Family history of malignant neoplasm of trachea, bronchus and lung: Secondary | ICD-10-CM

## 2022-05-07 DIAGNOSIS — Z79899 Other long term (current) drug therapy: Secondary | ICD-10-CM

## 2022-05-07 DIAGNOSIS — M009 Pyogenic arthritis, unspecified: Secondary | ICD-10-CM | POA: Diagnosis not present

## 2022-05-07 DIAGNOSIS — F17210 Nicotine dependence, cigarettes, uncomplicated: Secondary | ICD-10-CM | POA: Diagnosis not present

## 2022-05-07 DIAGNOSIS — G4730 Sleep apnea, unspecified: Secondary | ICD-10-CM | POA: Diagnosis present

## 2022-05-07 DIAGNOSIS — Z7982 Long term (current) use of aspirin: Secondary | ICD-10-CM | POA: Diagnosis not present

## 2022-05-07 DIAGNOSIS — G8929 Other chronic pain: Secondary | ICD-10-CM | POA: Diagnosis present

## 2022-05-07 DIAGNOSIS — M00219 Other streptococcal arthritis, unspecified shoulder: Secondary | ICD-10-CM | POA: Diagnosis not present

## 2022-05-07 DIAGNOSIS — M19012 Primary osteoarthritis, left shoulder: Secondary | ICD-10-CM | POA: Diagnosis present

## 2022-05-07 DIAGNOSIS — Z96641 Presence of right artificial hip joint: Secondary | ICD-10-CM | POA: Diagnosis present

## 2022-05-07 DIAGNOSIS — E785 Hyperlipidemia, unspecified: Secondary | ICD-10-CM | POA: Diagnosis present

## 2022-05-07 DIAGNOSIS — Z888 Allergy status to other drugs, medicaments and biological substances status: Secondary | ICD-10-CM | POA: Diagnosis not present

## 2022-05-07 DIAGNOSIS — Z7902 Long term (current) use of antithrombotics/antiplatelets: Secondary | ICD-10-CM | POA: Diagnosis not present

## 2022-05-07 DIAGNOSIS — Z8249 Family history of ischemic heart disease and other diseases of the circulatory system: Secondary | ICD-10-CM

## 2022-05-07 DIAGNOSIS — I6521 Occlusion and stenosis of right carotid artery: Secondary | ICD-10-CM | POA: Diagnosis present

## 2022-05-07 DIAGNOSIS — E1169 Type 2 diabetes mellitus with other specified complication: Secondary | ICD-10-CM | POA: Diagnosis present

## 2022-05-07 DIAGNOSIS — M75102 Unspecified rotator cuff tear or rupture of left shoulder, not specified as traumatic: Secondary | ICD-10-CM | POA: Diagnosis present

## 2022-05-07 DIAGNOSIS — M25512 Pain in left shoulder: Secondary | ICD-10-CM | POA: Diagnosis not present

## 2022-05-07 DIAGNOSIS — E1151 Type 2 diabetes mellitus with diabetic peripheral angiopathy without gangrene: Secondary | ICD-10-CM | POA: Diagnosis present

## 2022-05-07 DIAGNOSIS — I2510 Atherosclerotic heart disease of native coronary artery without angina pectoris: Secondary | ICD-10-CM | POA: Diagnosis present

## 2022-05-07 DIAGNOSIS — I10 Essential (primary) hypertension: Secondary | ICD-10-CM | POA: Diagnosis present

## 2022-05-07 DIAGNOSIS — D638 Anemia in other chronic diseases classified elsewhere: Secondary | ICD-10-CM | POA: Diagnosis present

## 2022-05-07 DIAGNOSIS — M25551 Pain in right hip: Secondary | ICD-10-CM | POA: Diagnosis present

## 2022-05-07 DIAGNOSIS — Z7984 Long term (current) use of oral hypoglycemic drugs: Secondary | ICD-10-CM | POA: Diagnosis not present

## 2022-05-07 DIAGNOSIS — E1142 Type 2 diabetes mellitus with diabetic polyneuropathy: Secondary | ICD-10-CM | POA: Diagnosis present

## 2022-05-07 DIAGNOSIS — Z885 Allergy status to narcotic agent status: Secondary | ICD-10-CM | POA: Diagnosis not present

## 2022-05-07 LAB — COMPREHENSIVE METABOLIC PANEL
ALT: 13 U/L (ref 0–44)
AST: 16 U/L (ref 15–41)
Albumin: 2.3 g/dL — ABNORMAL LOW (ref 3.5–5.0)
Alkaline Phosphatase: 123 U/L (ref 38–126)
Anion gap: 8 (ref 5–15)
BUN: 9 mg/dL (ref 8–23)
CO2: 25 mmol/L (ref 22–32)
Calcium: 8 mg/dL — ABNORMAL LOW (ref 8.9–10.3)
Chloride: 101 mmol/L (ref 98–111)
Creatinine, Ser: 0.88 mg/dL (ref 0.44–1.00)
GFR, Estimated: >60 mL/min (ref >60)
Glucose, Bld: 200 mg/dL — ABNORMAL HIGH (ref 70–99)
Potassium: 3.6 mmol/L (ref 3.5–5.1)
Sodium: 134 mmol/L — ABNORMAL LOW (ref 135–145)
Total Bilirubin: 0.4 mg/dL (ref 0.3–1.2)
Total Protein: 8.5 g/dL — ABNORMAL HIGH (ref 6.5–8.1)

## 2022-05-07 LAB — CBC WITH DIFFERENTIAL/PLATELET
Abs Immature Granulocytes: 0.03 10*3/uL (ref 0.00–0.07)
Basophils Absolute: 0 10*3/uL (ref 0.0–0.1)
Basophils Relative: 0 %
Eosinophils Absolute: 0.2 10*3/uL (ref 0.0–0.5)
Eosinophils Relative: 2 %
HCT: 29.7 % — ABNORMAL LOW (ref 36.0–46.0)
Hemoglobin: 9 g/dL — ABNORMAL LOW (ref 12.0–15.0)
Immature Granulocytes: 0 %
Lymphocytes Relative: 22 %
Lymphs Abs: 2.1 10*3/uL (ref 0.7–4.0)
MCH: 26 pg (ref 26.0–34.0)
MCHC: 30.3 g/dL (ref 30.0–36.0)
MCV: 85.8 fL (ref 80.0–100.0)
Monocytes Absolute: 0.7 10*3/uL (ref 0.1–1.0)
Monocytes Relative: 7 %
Neutro Abs: 6.8 10*3/uL (ref 1.7–7.7)
Neutrophils Relative %: 69 %
Platelets: 427 10*3/uL — ABNORMAL HIGH (ref 150–400)
RBC: 3.46 MIL/uL — ABNORMAL LOW (ref 3.87–5.11)
RDW: 15.9 % — ABNORMAL HIGH (ref 11.5–15.5)
WBC: 9.8 10*3/uL (ref 4.0–10.5)
nRBC: 0 % (ref 0.0–0.2)

## 2022-05-07 LAB — IRON AND TIBC
Iron: 21 ug/dL — ABNORMAL LOW (ref 28–170)
Saturation Ratios: 14 % (ref 10.4–31.8)
TIBC: 155 ug/dL — ABNORMAL LOW (ref 250–450)
UIBC: 134 ug/dL

## 2022-05-07 LAB — RETICULOCYTES
Immature Retic Fract: 9.1 % (ref 2.3–15.9)
RBC.: 3.44 MIL/uL — ABNORMAL LOW (ref 3.87–5.11)
Retic Count, Absolute: 27.9 10*3/uL (ref 19.0–186.0)
Retic Ct Pct: 0.8 % (ref 0.4–3.1)

## 2022-05-07 LAB — VITAMIN B12: Vitamin B-12: 1253 pg/mL — ABNORMAL HIGH (ref 180–914)

## 2022-05-07 LAB — LACTIC ACID, PLASMA
Lactic Acid, Venous: 3 mmol/L (ref 0.5–1.9)
Lactic Acid, Venous: 1.8 mmol/L (ref 0.5–1.9)

## 2022-05-07 LAB — SEDIMENTATION RATE: Sed Rate: 137 mm/hr — ABNORMAL HIGH (ref 0–22)

## 2022-05-07 LAB — FERRITIN: Ferritin: 1046 ng/mL — ABNORMAL HIGH (ref 11–307)

## 2022-05-07 LAB — FOLATE: Folate: 5.8 ng/mL — ABNORMAL LOW (ref >5.9)

## 2022-05-07 LAB — C-REACTIVE PROTEIN: CRP: 19.6 mg/dL — ABNORMAL HIGH (ref <1.0)

## 2022-05-07 MED ORDER — ALBUTEROL SULFATE (2.5 MG/3ML) 0.083% IN NEBU: 2.5000 mg | INHALATION_SOLUTION | RESPIRATORY_TRACT | Status: DC | PRN

## 2022-05-07 MED ORDER — CARVEDILOL PHOSPHATE ER 20 MG PO CP24
20.0000 mg | ORAL_CAPSULE | Freq: Every day | ORAL | Status: DC
  Administered 2022-05-08 – 2022-05-11 (×4): 20 mg via ORAL
  Filled 2022-05-07 (×4): qty 1

## 2022-05-07 MED ORDER — SODIUM CHLORIDE 0.9% FLUSH: 3.0000 mL | INTRAVENOUS | Status: DC | PRN

## 2022-05-07 MED ORDER — SODIUM CHLORIDE 0.9 % IV SOLN
250.0000 mL | INTRAVENOUS | Status: DC | PRN
  Administered 2022-05-11: 250 mL via INTRAVENOUS

## 2022-05-07 MED ORDER — GADOBUTROL 1 MMOL/ML IV SOLN
6.0000 mL | Freq: Once | INTRAVENOUS | Status: AC | PRN
  Administered 2022-05-07: 6 mL via INTRAVENOUS

## 2022-05-07 MED ORDER — SODIUM CHLORIDE 0.9 % IV BOLUS
500.0000 mL | Freq: Once | INTRAVENOUS | Status: AC
  Administered 2022-05-07: 500 mL via INTRAVENOUS

## 2022-05-07 MED ORDER — ACETAMINOPHEN 500 MG PO TABS: 1000.0000 mg | ORAL_TABLET | Freq: Once | ORAL | Status: DC

## 2022-05-07 MED ORDER — EZETIMIBE 10 MG PO TABS
10.0000 mg | ORAL_TABLET | Freq: Every day | ORAL | Status: DC
  Administered 2022-05-08 – 2022-05-11 (×4): 10 mg via ORAL
  Filled 2022-05-07 (×4): qty 1

## 2022-05-07 MED ORDER — FERROUS SULFATE 325 (65 FE) MG PO TABS
325.0000 mg | ORAL_TABLET | Freq: Every day | ORAL | Status: DC
  Administered 2022-05-08 – 2022-05-11 (×4): 325 mg via ORAL
  Filled 2022-05-07 (×4): qty 1

## 2022-05-07 MED ORDER — ONDANSETRON HCL 4 MG PO TABS: 4.0000 mg | ORAL_TABLET | Freq: Four times a day (QID) | ORAL | Status: DC | PRN

## 2022-05-07 MED ORDER — VITAMIN B-12 1000 MCG PO TABS: 1000.0000 ug | ORAL_TABLET | Freq: Every day | ORAL | Status: DC

## 2022-05-07 MED ORDER — DEXAMETHASONE SODIUM PHOSPHATE 10 MG/ML IJ SOLN: 8.0000 mg | Freq: Once | INTRAMUSCULAR | Status: DC

## 2022-05-07 MED ORDER — HYDROMORPHONE HCL 1 MG/ML IJ SOLN
0.5000 mg | INTRAMUSCULAR | Status: DC | PRN
  Administered 2022-05-08 (×3): 1 mg via INTRAVENOUS
  Filled 2022-05-07 (×3): qty 1

## 2022-05-07 MED ORDER — POVIDONE-IODINE 10 % EX SWAB: 2.0000 | Freq: Once | CUTANEOUS | Status: DC

## 2022-05-07 MED ORDER — INSULIN ASPART 100 UNIT/ML IJ SOLN
0.0000 [IU] | INTRAMUSCULAR | Status: DC
  Administered 2022-05-08: 5 [IU] via SUBCUTANEOUS
  Administered 2022-05-08 (×3): 2 [IU] via SUBCUTANEOUS
  Administered 2022-05-09: 3 [IU] via SUBCUTANEOUS
  Administered 2022-05-09 (×3): 2 [IU] via SUBCUTANEOUS
  Administered 2022-05-09 (×2): 5 [IU] via SUBCUTANEOUS
  Administered 2022-05-10 (×2): 2 [IU] via SUBCUTANEOUS
  Administered 2022-05-10: 1 [IU] via SUBCUTANEOUS
  Administered 2022-05-10: 2 [IU] via SUBCUTANEOUS
  Administered 2022-05-10: 1 [IU] via SUBCUTANEOUS
  Administered 2022-05-10: 2 [IU] via SUBCUTANEOUS
  Administered 2022-05-11: 1 [IU] via SUBCUTANEOUS
  Administered 2022-05-11: 3 [IU] via SUBCUTANEOUS
  Filled 2022-05-07: qty 0.09

## 2022-05-07 MED ORDER — AMLODIPINE BESYLATE 10 MG PO TABS
10.0000 mg | ORAL_TABLET | Freq: Every morning | ORAL | Status: DC
  Administered 2022-05-08 – 2022-05-11 (×4): 10 mg via ORAL
  Filled 2022-05-07 (×4): qty 1

## 2022-05-07 MED ORDER — POLYETHYLENE GLYCOL 3350 17 G PO PACK: 17.0000 g | PACK | Freq: Every day | ORAL | Status: DC

## 2022-05-07 MED ORDER — SODIUM CHLORIDE 0.9% FLUSH
3.0000 mL | Freq: Two times a day (BID) | INTRAVENOUS | Status: DC
  Administered 2022-05-08 – 2022-05-10 (×7): 3 mL via INTRAVENOUS

## 2022-05-07 MED ORDER — HYDROCODONE-ACETAMINOPHEN 5-325 MG PO TABS: 1.0000 | ORAL_TABLET | ORAL | Status: DC | PRN

## 2022-05-07 MED ORDER — BISACODYL 10 MG RE SUPP: 10.0000 mg | Freq: Every day | RECTAL | Status: DC | PRN

## 2022-05-07 MED ORDER — ONDANSETRON HCL 4 MG/2ML IJ SOLN: 4.0000 mg | Freq: Four times a day (QID) | INTRAMUSCULAR | Status: DC | PRN

## 2022-05-07 MED ORDER — CHLORHEXIDINE GLUCONATE 4 % EX LIQD: 60.0000 mL | Freq: Once | CUTANEOUS | Status: DC

## 2022-05-07 MED ORDER — HYDROMORPHONE HCL 1 MG/ML IJ SOLN
0.5000 mg | Freq: Once | INTRAMUSCULAR | Status: AC
  Administered 2022-05-07: 0.5 mg via INTRAVENOUS
  Filled 2022-05-07: qty 1

## 2022-05-07 MED ORDER — SENNA 8.6 MG PO TABS: 1.0000 | ORAL_TABLET | Freq: Two times a day (BID) | ORAL | Status: DC

## 2022-05-07 MED ORDER — ACETAMINOPHEN 325 MG PO TABS: 650.0000 mg | ORAL_TABLET | Freq: Four times a day (QID) | ORAL | Status: DC | PRN

## 2022-05-07 MED ORDER — CHLORHEXIDINE GLUCONATE 4 % EX LIQD
60.0000 mL | Freq: Once | CUTANEOUS | Status: AC
  Administered 2022-05-08: 4 via TOPICAL
  Filled 2022-05-07: qty 60

## 2022-05-07 MED ORDER — CEFAZOLIN SODIUM-DEXTROSE 2-4 GM/100ML-% IV SOLN
2.0000 g | INTRAVENOUS | Status: DC
  Filled 2022-05-07: qty 100

## 2022-05-07 MED ORDER — DEXAMETHASONE SODIUM PHOSPHATE 10 MG/ML IJ SOLN
8.0000 mg | Freq: Once | INTRAMUSCULAR | Status: AC
  Administered 2022-05-08: 8 mg via INTRAVENOUS
  Filled 2022-05-07: qty 1

## 2022-05-07 MED ORDER — ACETAMINOPHEN 650 MG RE SUPP: 650.0000 mg | Freq: Four times a day (QID) | RECTAL | Status: DC | PRN

## 2022-05-07 MED ORDER — ONDANSETRON HCL 4 MG/2ML IJ SOLN
4.0000 mg | Freq: Once | INTRAMUSCULAR | Status: AC
  Administered 2022-05-07: 4 mg via INTRAVENOUS
  Filled 2022-05-07: qty 2

## 2022-05-07 NOTE — ED Provider Triage Note (Signed)
Emergency Medicine Provider Triage Evaluation Note  April Shaffer , a 72 y.o. female  was evaluated in triage.  Pt complains of left shoulder pain.  States she went to [facility] orth[facility] on Friday and had fluid drawn off her left shoulder to test for infection.  Received a call today from [place], [place] from the group today stating that there was signs of infection and that she should come to the ED for admission for surgery.  Denies fevers or chills.  Review of Systems  Positive: As above Negative: As above  Physical Exam  BP 127/62 (BP Location: Left Arm)   Pulse 89   Temp 98.1 F (36.7 C) (Oral)   Resp 16   Ht '5\' 3"'$  (1.6 m)   Wt 59 kg   SpO2 100%   BMI 23.03 kg/m  Gen:   Awake, no distress   Resp:  Normal effort  MSK:   Moves extremities without difficulty  Other:  Significant TTP to left shoulder  Medical Decision Making  Medically screening exam initiated at 4:10 PM.  Appropriate orders placed.  April Shaffer was informed that the remainder of the evaluation will be completed by another provider, this initial triage assessment does not replace that evaluation, and the importance of remaining in the ED until their evaluation is complete.     April Shaffer, [place] 05/07/22 1611

## 2022-05-07 NOTE — Progress Notes (Signed)
Patient seen in clinic at [facility] on 3/8 where aspiration of the L shoulder has come back concerning for septic joint infection.   Recommend MRI L shoulder eval for osteo.  L hip aspiration by IR rule out L hip prosthetic joint infection. Admit to medicine for medical optimization and comanagement.  Likely plan for OR tomorrow with Dr. Percell Miller for L shoulder I&D.

## 2022-05-07 NOTE — ED Triage Notes (Signed)
Patient said she got a call from [facility] stating that she needs to have surgery on her left shoulder because it is infected. Patient has pain around her left shoulder and neck.

## 2022-05-07 NOTE — ED Provider Notes (Signed)
[facility] Provider Note  CSN: [id number] Arrival date & time: 05/07/22 1555  Chief Complaint(s) Shoulder Pain  HPI April Shaffer is a 72 y.o. female with past medical history as below, significant for hypertension, hyperlipidemia, PAD, DM 2, who presents to the ED with complaint of shoulder pain.  Patient with ongoing shoulder pain over the past 3 weeks, she had aspiration completed recently concerning for infection and orthopedic office as an outpatient.  Having some ongoing left neck and left shoulder pain, difficulty with range of motion to left upper extremity.  She received call back from orthopedic office today with concern for infection to her left shoulder, sent to the ED for further evaluation and admission.  Patient denies fevers or chills over the past 24 hours, no nausea or vomiting.  Ongoing pain to her left shoulder unrelieved with home oral analgesics.  She is on chronic opiates.  She has tingling to her fingertips bilateral unchanged.  No recent falls or injuries, no midline cervical or thoracic back pain.  No recent diet or medication changes.  Accompanied by friend/caretaker  Past Medical History Past Medical History:  Diagnosis Date   Anemia    Arthritis    Blood transfusion without reported diagnosis    Carotid artery narrowing    Coronary artery disease    Cardiac catheterization November 2013: 50% ostial LAD stenosis 50% mid stenosis. 30% disease in the left circumflex.   Diabetes mellitus, type 2 ([place])    Hyperlipidemia    Hypertension    Onychomycosis of toenail 07/31/2016   PAD (peripheral artery disease) ([place])    Sleep apnea       Had surgery to correct   Thyroid nodule    Patient Active Problem List   Diagnosis Date Noted   Septic arthritis of shoulder ([place]) 05/07/2022   Anemia of chronic disease 04/10/2022   Lumbar pseudoarthrosis 01/24/2022   Occlusion and stenosis of right carotid artery 01/24/2022    Iron deficiency anemia secondary to blood loss (chronic) 01/08/2022   B12 deficiency 08/31/2021   Medication monitoring encounter 12/12/2020   Nausea 12/12/2020   Hammer toes of both feet 11/09/2020   Acute maxillary sinusitis 08/08/2020   Hyperlipidemia 08/08/2020   Pain due to onychomycosis of toenails of both feet 08/08/2020   Therapeutic drug monitoring 06/22/2020   Wound infection after surgery 05/25/2020   Wound drainage 05/24/2020   Local infection of the skin and subcutaneous tissue, unspecified 05/24/2020   Body mass index (BMI) 27.0-27.9, adult 05/10/2020   S/P lumbar fusion 04/25/2020   Microalbuminuria due to type 2 diabetes mellitus ([place]) 08/07/2019   Primary localized osteoarthritis of right hip 08/22/2018   Carpal tunnel syndrome, bilateral 08/06/2018   Type 2 diabetes mellitus with peripheral neuropathy ([place]) 08/06/2018   Degenerative joint disease of right hip 08/06/2018   Preoperative clearance 03/26/2018   Upper back pain on left side 10/30/2016   Encounter for postoperative carotid endarterectomy surveillance 12/07/2015   Varicosities of leg 06/03/2015   Acute left lumbar radiculopathy 04/15/2014   Chronic sciatica of right side 03/17/2013   Diabetic peripheral neuropathy ([place]) 03/17/2013   Coronary artery disease    Carotid artery stenosis s/p CEA 2013 10/16/2011   Carotid artery occlusion without infarction, right 09/17/2011   Carotid bruit 08/28/2011   Gout 04/13/2011   DM type 2 with diabetic peripheral neuropathy ([place]) 04/02/2008   Hyperlipidemia associated with type 2 diabetes mellitus ([place]) 04/02/2008   Essential hypertension 04/02/2008  DEGENERATIVE DISC DISEASE, LUMBOSACRAL SPINE 04/02/2008   Home Medication(s) Prior to Admission medications   Medication Sig Start Date End Date Taking? Authorizing Provider  allopurinol (ZYLOPRIM) 300 MG tablet Take 1 tablet (300 mg total) by mouth daily. 07/09/19   Mack Hook, MD  amLODipine (NORVASC) 10  MG tablet Take 10 mg by mouth in the morning.    [provider]  aspirin EC 81 MG tablet Take 81 mg by mouth in the morning.    [provider]  atorvastatin (LIPITOR) 80 MG tablet TAKE 1 TABLET BY MOUTH ONCE DAILY WITH  EVENING  MEAL Patient taking differently: Take 80 mg by mouth in the morning. 07/09/19   Mack Hook, MD  blood glucose meter kit and supplies KIT Check sugars twice daily 06/10/18   Mack Hook, MD  carvedilol (COREG CR) 20 MG 24 hr capsule Take 1 capsule (20 mg total) by mouth daily. 07/09/19   Mack Hook, MD  clopidogrel (PLAVIX) 75 MG tablet Take 75 mg by mouth in the morning.    [provider]  cyanocobalamin (VITAMIN B12) 1000 MCG tablet Take 1,000 mcg by mouth daily.    [provider]  ezetimibe (ZETIA) 10 MG tablet TAKE 1 TABLET EVERY DAY (NEED MD APPOINTMENT) 09/27/20   Lorretta Harp, MD  ferrous sulfate 325 (65 FE) MG tablet Take 325 mg by mouth in the morning and at bedtime.    [provider]  gabapentin (NEURONTIN) 300 MG capsule Take 300 mg by mouth 3 (three) times daily. 06/09/21   [provider]  glucose blood test strip Check sugars twice daily 01/31/16   Mack Hook, MD  metFORMIN (GLUCOPHAGE) 500 MG tablet Take 500 mg by mouth 2 (two) times daily with a meal. 04/08/20   [provider]  methylPREDNISolone (MEDROL DOSEPAK) 4 MG TBPK tablet Take as prescribed on packet 04/20/22   Horton, Barbette Hair, MD  oxyCODONE-acetaminophen (PERCOCET) 10-325 MG tablet Take 1 tablet by mouth every 4 (four) hours as needed for pain. Patient taking differently: Take 1 tablet by mouth every 6 (six) hours as needed for pain. 12/03/20   Eustace Moore, MD                                                                                                                                    Past Surgical History Past Surgical History:  Procedure Laterality Date   ABDOMINAL AORTOGRAM W/LOWER  EXTREMITY N/A 08/07/2021   Procedure: ABDOMINAL AORTOGRAM W/LOWER EXTREMITY;  Surgeon: Waynetta Sandy, MD;  Location: [facility];  Service: Cardiovascular;  Laterality: N/A;   ABDOMINAL AORTOGRAM W/LOWER EXTREMITY Right 10/23/2021   Procedure: ABDOMINAL AORTOGRAM W/LOWER EXTREMITY;  Surgeon: Waynetta Sandy, MD;  Location: [facility];  Service: Cardiovascular;  Laterality: Right;   ABDOMINAL HYSTERECTOMY     APPLICATION OF INTRAOPERATIVE CT SCAN N/A 11/30/2020   Procedure: APPLICATION OF INTRAOPERATIVE  CT SCAN;  Surgeon: Eustace Moore, MD;  Location: [facility];  Service: Neurosurgery;  Laterality: N/A;   BACK SURGERY     CARDIAC CATHETERIZATION     CATARACT EXTRACTION, BILATERAL Bilateral 2021   Dr. Sherral Hammers   ENDARTERECTOMY  09/27/2011   Procedure: RIGT ENDARTERECTOMY CAROTID;  Surgeon: Mal Misty, MD;  Location: [facility];  Service: Vascular;  Laterality: Right;   EPIDURAL BLOCK INJECTION  02/2008   Drs. Eulis Manly   gyn surgery  2004   total hysterectomy for mennorhagia,,salpingoophorectomy   LAMINECTOMY WITH POSTERIOR LATERAL ARTHRODESIS LEVEL 2 N/A 11/30/2020   Procedure: LUMBAR FOUR-FIVE, LUMBAR FIVE-SACRAL ONE POSTERIOR LATERAL FUSION WITH REVISION OF LUMBAR ONE-FIVE HARDWARE AND EXTENSION TO SACRAL ONE AND SACRAL TWO;  Surgeon: Eustace Moore, MD;  Location: [facility];  Service: Neurosurgery;  Laterality: N/A;   LUMBAR WOUND DEBRIDEMENT N/A 05/25/2020   Procedure: LUMBAR WOUND IRRIGATION AND DEBRIDEMENT;  Surgeon: Eustace Moore, MD;  Location: [facility];  Service: Neurosurgery;  Laterality: N/A;   PERIPHERAL VASCULAR INTERVENTION  08/07/2021   Procedure: PERIPHERAL VASCULAR INTERVENTION;  Surgeon: Waynetta Sandy, MD;  Location: [facility];  Service: Cardiovascular;;  Rt SFA   PERIPHERAL VASCULAR INTERVENTION Right 10/23/2021   Procedure: PERIPHERAL VASCULAR INTERVENTION;  Surgeon: Waynetta Sandy, MD;  Location: [facility];  Service: Cardiovascular;  Laterality: Right;  SFA   POSTERIOR LUMBAR FUSION 4 LEVEL N/A 04/25/2020   Procedure: POSTERIOR LUMBAR INTERBODY FUSION LUMBAR ONE-TWO, LUMBAR TWO-THREE, LUMBAR THREE-FOUR,LUMBAR FOUR-FIVE.;  Surgeon: Eustace Moore, MD;  Location: [facility];  Service: Neurosurgery;  Laterality: N/A;  posterior   TONSILLECTOMY     TOTAL ABDOMINAL HYSTERECTOMY W/ BILATERAL SALPINGOOPHORECTOMY Bilateral 2000   TOTAL HIP ARTHROPLASTY Right 08/22/2018   Procedure: TOTAL HIP ARTHROPLASTY;  Surgeon: Earlie Server, MD;  Location: [facility];  Service: Orthopedics;  Laterality: Right;   VARICOSE VEIN SURGERY  2008   stripping   Family History Family History  Problem Relation Age of Onset   Hypertension Sister    Hypothyroidism Sister    Cancer Maternal Grandmother        lung   Hypertension Son    Hypertension Sister    Diabetes Brother        lost toe in 2018 with new diagnosis of DM   Hypertension Son     Social History Social History   Tobacco Use   Smoking status: Every Day    Packs/day: 0.25    Years: 50.00    Total pack years: 12.50    Types: Cigarettes    Start date: 05/31/1968    Passive exposure: Never   Smokeless tobacco: Never   Tobacco comments:    Smoked x50 years, 1 pack would last 3 days. Currently smoking 4 cigarettes per day  Vaping Use   Vaping Use: Never used  Substance Use Topics   Alcohol use: Yes    Alcohol/week: 1.0 standard drink of alcohol    Types: 1 Cans of beer per week    Comment: 1-2 drinks every other day   Drug use: No   Allergies Zestoretic [lisinopril-hydrochlorothiazide], Codeine, Oxycodone, Ultram [tramadol], and Zocor [simvastatin]  Review of Systems Review of Systems  Constitutional:  Negative for activity change and fever.  HENT:  Negative for facial swelling and trouble swallowing.   Eyes:  Negative for discharge and redness.  Respiratory:  Negative for cough and shortness of breath.   Cardiovascular:  Negative for  palpitations and leg  swelling.  Gastrointestinal:  Negative for abdominal pain and nausea.  Genitourinary:  Negative for dysuria and flank pain.  Musculoskeletal:  Positive for arthralgias and neck pain. Negative for back pain and gait problem.  Skin:  Negative for pallor and rash.  Neurological:  Negative for syncope and headaches.    Physical Exam Vital Signs  I have reviewed the triage vital signs BP 137/86 (BP Location: Left Arm)   Pulse 87   Temp 98.2 F (36.8 C) (Oral)   Resp 16   Ht '5\' 3"'$  (1.6 m)   Wt 59 kg   SpO2 100%   BMI 23.03 kg/m  Physical Exam Vitals and nursing note reviewed.  Constitutional:      General: She is not in acute distress.    Appearance: Normal appearance.  HENT:     Head: Normocephalic and atraumatic.     Right Ear: External ear normal.     Left Ear: External ear normal.     Nose: Nose normal.     Mouth/Throat:     Mouth: Mucous membranes are moist.  Eyes:     General: No scleral icterus.       Right eye: No discharge.        Left eye: No discharge.  Cardiovascular:     Rate and Rhythm: Normal rate and regular rhythm.     Pulses: Normal pulses.     Heart sounds: Normal heart sounds.  Pulmonary:     Effort: Pulmonary effort is normal. No respiratory distress.     Breath sounds: Normal breath sounds.  Abdominal:     General: Abdomen is flat.     Tenderness: There is no abdominal tenderness.  Musculoskeletal:        General: Tenderness present.       Arms:     Right lower leg: No edema.     Left lower leg: No edema.     Comments: Upper extremities are NVI bilateral Pain with range of motion to left shoulder. Mild swelling to his left shoulder  Skin:    General: Skin is warm and dry.     Capillary Refill: Capillary refill takes less than 2 seconds.  Neurological:     Mental Status: She is alert and oriented to person, place, and time.     GCS: GCS eye subscore is 4. GCS verbal subscore is 5. GCS motor subscore is 6.  Psychiatric:         Mood and Affect: Mood normal.        Behavior: Behavior normal.     ED Results and Treatments Labs (all labs ordered are listed, but only abnormal results are displayed) Labs Reviewed  CBC WITH DIFFERENTIAL/PLATELET - Abnormal; Notable for the following components:      Result Value   RBC 3.46 (*)    Hemoglobin 9.0 (*)    HCT 29.7 (*)    RDW 15.9 (*)    Platelets 427 (*)    All other components within normal limits  COMPREHENSIVE METABOLIC PANEL - Abnormal; Notable for the following components:   Sodium 134 (*)    Glucose, Bld 200 (*)    Calcium 8.0 (*)    Total Protein 8.5 (*)    Albumin 2.3 (*)    All other components within normal limits  LACTIC ACID, PLASMA - Abnormal; Notable for the following components:   Lactic Acid, Venous 3.0 (*)    All other components within normal limits  SEDIMENTATION RATE - Abnormal; Notable  for the following components:   Sed Rate 137 (*)    All other components within normal limits  C-REACTIVE PROTEIN - Abnormal; Notable for the following components:   CRP 19.6 (*)    All other components within normal limits  VITAMIN B12 - Abnormal; Notable for the following components:   Vitamin B-12 1,253 (*)    All other components within normal limits  FOLATE - Abnormal; Notable for the following components:   Folate 5.8 (*)    All other components within normal limits  IRON AND TIBC - Abnormal; Notable for the following components:   Iron 21 (*)    TIBC 155 (*)    All other components within normal limits  FERRITIN - Abnormal; Notable for the following components:   Ferritin 1,046 (*)    All other components within normal limits  RETICULOCYTES - Abnormal; Notable for the following components:   RBC. 3.44 (*)    All other components within normal limits  GLUCOSE, CAPILLARY - Abnormal; Notable for the following components:   Glucose-Capillary 169 (*)    All other components within normal limits  CULTURE, BLOOD (ROUTINE X 2)  CULTURE,  BLOOD (ROUTINE X 2)  LACTIC ACID, PLASMA  HIV ANTIBODY (ROUTINE TESTING W REFLEX)  CBC  BASIC METABOLIC PANEL                                                                                                                          Radiology MR Shoulder Right W Wo Contrast  Result Date: 05/07/2022 CLINICAL DATA:  Right shoulder pain EXAM: MRI OF THE RIGHT SHOULDER WITHOUT AND WITH CONTRAST TECHNIQUE: Multiplanar, multisequence MR imaging of the radiograph dated April 10, 2018 for shoulder was performed before and after the administration of intravenous contrast. CONTRAST:  56m GADAVIST GADOBUTROL 1 MMOL/ML IV SOLN COMPARISON:  None Available. FINDINGS: Rotator cuff: Supraspinatus tendinosis with low-grade partial-thickness (less than 50%) insertional tear at the footprint. Infraspinatus tendon is intact. Teres minor tendon is intact. Subscapularis tendon is intact. Muscles: No muscle atrophy or edema. No intramuscular fluid collection or hematoma. Biceps Long Head: Intraarticular and extraarticular portions of the biceps tendon are intact. Acromioclavicular Joint: Mild arthropathy of the acromioclavicular joint. No subacromial/subdeltoid bursal fluid. Glenohumeral Joint: No joint effusion. Articular cartilage thinning. No full-thickness cartilage defect. Labrum: Degenerative changes of the labrum with a chronic degenerative tear of the anteroinferior labrum. Bones: No fracture or dislocation. No aggressive osseous lesion. Other: No fluid collection or hematoma. IMPRESSION: 1. Supraspinatus tendinosis with low-grade partial-thickness (less than 50%) insertional tear at the footprint. 2. Degenerative changes of the labrum with a chronic degenerative tear of the anteroinferior labrum. 3. Mild acromioclavicular osteoarthritis . 4. No evidence of fracture or dislocation. 5. No joint effusion or evidence of septic arthritis . Electronically Signed   By: IKeane PoliceD.O.   On: 05/07/2022 23:27   MR SHOULDER  LEFT W WO CONTRAST  Result Date: 05/07/2022 CLINICAL DATA:  Bilateral shoulder pain,  the left shoulder possibly septic. EXAM: MRI OF THE LEFT SHOULDER WITHOUT AND WITH CONTRAST TECHNIQUE: Multiplanar, multisequence MR imaging of the shoulder was performed before and after the administration of intravenous contrast. CONTRAST:  38m GADAVIST GADOBUTROL 1 MMOL/ML IV SOLN COMPARISON:  Radiograph performed earlier on the same date. FINDINGS: Rotator cuff: Supraspinatus tendinosis with full-thickness partial width tear measuring a proximally 9 mm in AP dimension. Infraspinatus tendinosis with high-grade partial-thickness tear at the footprint. Teres minor tendon is intact. Subscapularis tendon is intact. Muscles: There is intramuscular edema with enhancement on the postcontrast sequences highly concerning for infectious/inflammatory process Biceps Long Head: Intraarticular and extraarticular portions of the biceps tendon are intact. Acromioclavicular Joint: Severe arthropathy of the acromioclavicular joint with joint effusion. There is fluid about the superior aspect of the acromioclavicular joint. Glenohumeral Joint: Large shoulder joint effusion with synovial enhancement on post-contrast sequences highly suspicious for septic arthritis. Advanced articular cartilage thinning. No full-thickness cartilage defect Labrum: Degenerative changes of the labrum without evidence of acute tear. Bones: No evidence of osteomyelitis. Bulky glenohumeral and acromioclavicular marginal osteophytes. No fracture or dislocation Other: Small fluid collection about the superior aspect of the acromioclavicular joint. IMPRESSION: 1. Large left shoulder joint effusion with synovial enhancement on post-contrast sequences highly suspicious for septic arthritis. Orthopedic consultation and joint aspiration for further management is suggested. 2. Severe acromioclavicular arthropathy with joint effusion and fluid about the superior aspect of the  joint concerning for septic arthritis. 3. Edema of the supraspinatus, infraspinatus and subscapularis muscles, likely reactive secondary to infectious/inflammatory process. 4. No evidence of osteomyelitis. 5. Supraspinatus and infraspinatus tendinosis with full-thickness partial width tear of the supraspinatus and high-grade partial-thickness tear of the infraspinatus. 6. Advanced glenohumeral and acromioclavicular osteoarthritis. Electronically Signed   By: IKeane PoliceD.O.   On: 05/07/2022 23:14   DG Shoulder Left  Result Date: 05/07/2022 CLINICAL DATA:  Pain EXAM: LEFT SHOULDER - 2+ VIEW COMPARISON:  None Available. FINDINGS: There is no evidence of fracture or dislocation. There is glenohumeral and acromioclavicular joint space narrowing and osteophyte formation compatible with degenerative change. Soft tissues are unremarkable. IMPRESSION: 1. No acute fracture or dislocation. 2. Degenerative changes in the glenohumeral and acromioclavicular joints. Electronically Signed   By: ARonney AstersM.D.   On: 05/07/2022 17:21    Pertinent labs & imaging results that were available during my care of the patient were reviewed by me and considered in my medical decision making (see MDM for details).  Medications Ordered in ED Medications  povidone-iodine 10 % swab 2 Application (2 Applications Topical Not Given 05/08/22 0014)  ceFAZolin (ANCEF) IVPB 2g/100 mL premix (has no administration in time range)  amLODipine (NORVASC) tablet 10 mg (has no administration in time range)  carvedilol (COREG CR) 24 hr capsule 20 mg (has no administration in time range)  ezetimibe (ZETIA) tablet 10 mg (has no administration in time range)  cyanocobalamin (VITAMIN B12) tablet 1,000 mcg (has no administration in time range)  ferrous sulfate tablet 325 mg (has no administration in time range)  insulin aspart (novoLOG) injection 0-9 Units (2 Units Subcutaneous Given 05/08/22 0014)  sodium chloride flush (NS) 0.9 % injection 3  mL (3 mLs Intravenous Given 05/08/22 0014)  sodium chloride flush (NS) 0.9 % injection 3 mL (has no administration in time range)  0.9 %  sodium chloride infusion (has no administration in time range)  acetaminophen (TYLENOL) tablet 650 mg (has no administration in time range)    Or  acetaminophen (TYLENOL) suppository 650  mg (has no administration in time range)  HYDROcodone-acetaminophen (NORCO/VICODIN) 5-325 MG per tablet 1-2 tablet (has no administration in time range)  HYDROmorphone (DILAUDID) injection 0.5-1 mg (1 mg Intravenous Given 05/08/22 0017)  polyethylene glycol (MIRALAX / GLYCOLAX) packet 17 g (has no administration in time range)  senna (SENOKOT) tablet 8.6 mg (has no administration in time range)  ondansetron (ZOFRAN) tablet 4 mg (has no administration in time range)    Or  ondansetron (ZOFRAN) injection 4 mg (has no administration in time range)  bisacodyl (DULCOLAX) suppository 10 mg (has no administration in time range)  albuterol (PROVENTIL) (2.5 MG/3ML) 0.083% nebulizer solution 2.5 mg (has no administration in time range)  chlorhexidine (HIBICLENS) 4 % liquid 4 Application (has no administration in time range)  acetaminophen (TYLENOL) tablet 1,000 mg (has no administration in time range)  dexamethasone (DECADRON) injection 8 mg (has no administration in time range)  HYDROmorphone (DILAUDID) injection 0.5 mg (0.5 mg Intravenous Given 05/07/22 1939)  ondansetron (ZOFRAN) injection 4 mg (4 mg Intravenous Given 05/07/22 1940)  sodium chloride 0.9 % bolus 500 mL (500 mLs Intravenous New Bag/Given 05/07/22 1944)  gadobutrol (GADAVIST) 1 MMOL/ML injection 6 mL (6 mLs Intravenous Contrast Given 05/07/22 2215)                                                                                                                                     Procedures Procedures  (including critical care time)  Medical Decision Making / ED Course    Medical Decision Making:    MARQUESHIA BEUS is a 72 y.o. female with past medical history as below, significant for hypertension, hyperlipidemia, PAD, DM 2, who presents to the ED with complaint of shoulder pain.. The complaint involves an extensive differential diagnosis and also carries with it a high risk of complications and morbidity.  Serious etiology was considered. Ddx includes but is not limited to: Infectious, soft tissue injury, MSK, osteomyelitis, ligamentous injury, etc.  Complete initial physical exam performed, notably the patient  was no acute distress, breathing comfortably on ambient air..    Reviewed and confirmed nursing documentation for past medical history, family history, social history.  Vital signs reviewed.    Clinical Course as of 05/08/22 Vassie Moselle May 07, 2022  1903 Ortho plan for procedure tomorrow morning, n.p.o. at midnight.  Recommend MRI of shoulder, rule out osteo.  Recommend admit to medicine. [SG]  1910 Spoke with Dr. Zachery Dakins, hold antibiotics for now. Request MRI left shoulder w/ contrast. Admit to medicine.Plan OR tomorrow  [SG]  1911 Hemoglobin(!): 9.0 Baseline 9-11, send anemia panel. No fatigue, no BRBPR or melena, no thinners  [SG]  1927 Admit to medicine  [SG]    Clinical Course User Index [SG] Jeanell Sparrow, DO     Admitted to [facility]   Additional history obtained: -Additional history obtained from caregiver and friend -External records from outside source obtained  and reviewed including: Chart review including previous notes, labs, imaging, consultation notes including prior labs and imaging, home medications, prior micro   Lab Tests: -I ordered, reviewed, and interpreted labs.   The pertinent results include:   Labs Reviewed  CBC WITH DIFFERENTIAL/PLATELET - Abnormal; Notable for the following components:      Result Value   RBC 3.46 (*)    Hemoglobin 9.0 (*)    HCT 29.7 (*)    RDW 15.9 (*)    Platelets 427 (*)    All other components within normal limits   COMPREHENSIVE METABOLIC PANEL - Abnormal; Notable for the following components:   Sodium 134 (*)    Glucose, Bld 200 (*)    Calcium 8.0 (*)    Total Protein 8.5 (*)    Albumin 2.3 (*)    All other components within normal limits  LACTIC ACID, PLASMA - Abnormal; Notable for the following components:   Lactic Acid, Venous 3.0 (*)    All other components within normal limits  SEDIMENTATION RATE - Abnormal; Notable for the following components:   Sed Rate 137 (*)    All other components within normal limits  C-REACTIVE PROTEIN - Abnormal; Notable for the following components:   CRP 19.6 (*)    All other components within normal limits  VITAMIN B12 - Abnormal; Notable for the following components:   Vitamin B-12 1,253 (*)    All other components within normal limits  FOLATE - Abnormal; Notable for the following components:   Folate 5.8 (*)    All other components within normal limits  IRON AND TIBC - Abnormal; Notable for the following components:   Iron 21 (*)    TIBC 155 (*)    All other components within normal limits  FERRITIN - Abnormal; Notable for the following components:   Ferritin 1,046 (*)    All other components within normal limits  RETICULOCYTES - Abnormal; Notable for the following components:   RBC. 3.44 (*)    All other components within normal limits  GLUCOSE, CAPILLARY - Abnormal; Notable for the following components:   Glucose-Capillary 169 (*)    All other components within normal limits  CULTURE, BLOOD (ROUTINE X 2)  CULTURE, BLOOD (ROUTINE X 2)  LACTIC ACID, PLASMA  HIV ANTIBODY (ROUTINE TESTING W REFLEX)  CBC  BASIC METABOLIC PANEL    Notable for mild anemia noted, elevated lactic acid, ESR/CRP is elevated, no leukocytosis  EKG   EKG Interpretation  Date/Time:    Ventricular Rate:    PR Interval:    QRS Duration:   QT Interval:    QTC Calculation:   R Axis:     Text Interpretation:           Imaging Studies ordered: I ordered imaging  studies including x-ray shoulder,. MRI shoulder I independently visualized the following imaging with scope of interpretation limited to determining acute life threatening conditions related to emergency care: Degenerative changes noted, no acute findings; MRI pending  I independently visualized and interpreted imaging. I agree with the radiologist interpretation   Medicines ordered and prescription drug management: Meds ordered this encounter  Medications   DISCONTD: chlorhexidine (HIBICLENS) 4 % liquid 4 Application   povidone-iodine 10 % swab 2 Application   DISCONTD: acetaminophen (TYLENOL) tablet 1,000 mg   DISCONTD: dexamethasone (DECADRON) injection 8 mg   ceFAZolin (ANCEF) IVPB 2g/100 mL premix    Order Specific Question:   Indication:    Answer:   Surgical Prophylaxis  HYDROmorphone (DILAUDID) injection 0.5 mg   ondansetron (ZOFRAN) injection 4 mg   sodium chloride 0.9 % bolus 500 mL   amLODipine (NORVASC) tablet 10 mg   carvedilol (COREG CR) 24 hr capsule 20 mg   ezetimibe (ZETIA) tablet 10 mg   cyanocobalamin (VITAMIN B12) tablet 1,000 mcg   ferrous sulfate tablet 325 mg   insulin aspart (novoLOG) injection 0-9 Units    Order Specific Question:   Correction coverage:    Answer:   Sensitive (thin, NPO, renal)    Order Specific Question:   CBG < 70:    Answer:   Implement Hypoglycemia Standing Orders and refer to Hypoglycemia Standing Orders sidebar report    Order Specific Question:   CBG 70 - 120:    Answer:   0 units    Order Specific Question:   CBG 121 - 150:    Answer:   1 unit    Order Specific Question:   CBG 151 - 200:    Answer:   2 units    Order Specific Question:   CBG 201 - 250:    Answer:   3 units    Order Specific Question:   CBG 251 - 300:    Answer:   5 units    Order Specific Question:   CBG 301 - 350:    Answer:   7 units    Order Specific Question:   CBG 351 - 400    Answer:   9 units    Order Specific Question:   CBG > 400    Answer:   call  MD and obtain STAT lab verification   sodium chloride flush (NS) 0.9 % injection 3 mL   sodium chloride flush (NS) 0.9 % injection 3 mL   0.9 %  sodium chloride infusion   OR Linked Order Group    acetaminophen (TYLENOL) tablet 650 mg    acetaminophen (TYLENOL) suppository 650 mg   HYDROcodone-acetaminophen (NORCO/VICODIN) 5-325 MG per tablet 1-2 tablet   HYDROmorphone (DILAUDID) injection 0.5-1 mg   polyethylene glycol (MIRALAX / GLYCOLAX) packet 17 g   senna (SENOKOT) tablet 8.6 mg   OR Linked Order Group    ondansetron (ZOFRAN) tablet 4 mg    ondansetron (ZOFRAN) injection 4 mg   bisacodyl (DULCOLAX) suppository 10 mg   albuterol (PROVENTIL) (2.5 MG/3ML) 0.083% nebulizer solution 2.5 mg   chlorhexidine (HIBICLENS) 4 % liquid 4 Application   acetaminophen (TYLENOL) tablet 1,000 mg   dexamethasone (DECADRON) injection 8 mg   gadobutrol (GADAVIST) 1 MMOL/ML injection 6 mL    -I have reviewed the patients home medicines and have made adjustments as needed   Consultations Obtained: I requested consultation with the orthopedics,  and discussed lab and imaging findings as well as pertinent plan - they recommend: Admission, OR tomorrow, n.p.o. midnight   Cardiac Monitoring: The patient was maintained on a cardiac monitor.  I personally viewed and interpreted the cardiac monitored which showed an underlying rhythm of: NSR  Social Determinants of Health:  Diagnosis or treatment significantly limited by social determinants of health: current smoker   Reevaluation: After the interventions noted above, I reevaluated the patient and found that they have improved  Co morbidities that complicate the patient evaluation  Past Medical History:  Diagnosis Date   Anemia    Arthritis    Blood transfusion without reported diagnosis    Carotid artery narrowing    Coronary artery disease    Cardiac catheterization November 2013: 50%  ostial LAD stenosis 50% mid stenosis. 30% disease in the  left circumflex.   Diabetes mellitus, type 2 ([place])    Hyperlipidemia    Hypertension    Onychomycosis of toenail 07/31/2016   PAD (peripheral artery disease) ([facility])    Sleep apnea       Had surgery to correct   Thyroid nodule       Dispostion: Disposition decision including need for hospitalization was considered, and patient admitted to the hospital.    Final Clinical Impression(s) / ED Diagnoses Final diagnoses:  Acute pain of left shoulder  Joint infection [facility])     This chart was dictated using voice recognition software.  Despite best efforts to proofread,  errors can occur which can change the documentation meaning.    Jeanell Sparrow, DO 05/08/22 Benancio Deeds

## 2022-05-07 NOTE — ED Notes (Signed)
Spoke to charge they stated they have tried several times to put purple man, unable to. Ed handoff given to [employer].

## 2022-05-07 NOTE — H&P (Signed)
History and Physical    Patient: April Shaffer [id number] DOB: 1950-07-21 DOA: 05/07/2022 DOS: the patient was seen and examined on 05/07/2022 PCP: Sonia Side., FNP  Patient coming from: Home  Chief Complaint:  Chief Complaint  Patient presents with   Shoulder Pain   HPI: April Shaffer is a 72 y.o. female with medical history significant of DM-2, HTN, HLD, right carotid artery stenosis-who was referred from orthopedic office for left shoulder septic arthritis.  Per patient-since Valentine's Day/February 14-patient has had right shoulder pain.  Pain then gradually migrated from right shoulder to her left shoulder.  Her right shoulder is currently pain-free.  For the past 2 weeks-she has had worsening left shoulder pain.  She was seen at her orthopedics office where she was noted to have significant swelling of the left shoulder.  She had arthrocentesis done on 3/9-Gram stain came back positive for gram-positive cocci in chains and clusters-she was then sent to the ED for inpatient evaluation/treatment.  Per patient-she also has had worsening right hip pain for the past several days.  She has a prosthesis in her right hip.  She did have a episode of chills this morning but has not had fever  Note-this MD spoke with orthopedic MD Dr. Jeannette Corpus stain was apparently positive for gram-positive cocci in chains and clusters.  No headache No chest pain No shortness of breath No nausea, vomiting or diarrhea No abdominal pain No hematochezia/melena No hematuria/dysuria   Review of Systems: As mentioned in the history of present illness. All other systems reviewed and are negative. Past Medical History:  Diagnosis Date   Anemia    Arthritis    Blood transfusion without reported diagnosis    Carotid artery narrowing    Coronary artery disease    Cardiac catheterization November 2013: 50% ostial LAD stenosis 50% mid stenosis. 30% disease in the left circumflex.    Diabetes mellitus, type 2 ([place])    Hyperlipidemia    Hypertension    Onychomycosis of toenail 07/31/2016   PAD (peripheral artery disease) (HCC)    Sleep apnea       Had surgery to correct   Thyroid nodule    Past Surgical History:  Procedure Laterality Date   ABDOMINAL AORTOGRAM W/LOWER EXTREMITY N/A 08/07/2021   Procedure: ABDOMINAL AORTOGRAM W/LOWER EXTREMITY;  Surgeon: Waynetta Sandy, MD;  Location: [place] [facility];  Service: Cardiovascular;  Laterality: N/A;   ABDOMINAL AORTOGRAM W/LOWER EXTREMITY Right 10/23/2021   Procedure: ABDOMINAL AORTOGRAM W/LOWER EXTREMITY;  Surgeon: Waynetta Sandy, MD;  Location: [place] [facility];  Service: Cardiovascular;  Laterality: Right;   ABDOMINAL HYSTERECTOMY     APPLICATION OF INTRAOPERATIVE CT SCAN N/A 11/30/2020   Procedure: APPLICATION OF INTRAOPERATIVE CT SCAN;  Surgeon: Eustace Moore, MD;  Location: [place];  Service: Neurosurgery;  Laterality: N/A;   BACK SURGERY     CARDIAC CATHETERIZATION     CATARACT EXTRACTION, BILATERAL Bilateral 2021   Dr. Sherral Hammers   ENDARTERECTOMY  09/27/2011   Procedure: RIGT ENDARTERECTOMY CAROTID;  Surgeon: Mal Misty, MD;  Location: [place];  Service: Vascular;  Laterality: Right;   EPIDURAL BLOCK INJECTION  02/2008   Drs. Eulis Manly   gyn surgery  2004   total hysterectomy for mennorhagia,,salpingoophorectomy   LAMINECTOMY WITH POSTERIOR LATERAL ARTHRODESIS LEVEL 2 N/A 11/30/2020   Procedure: LUMBAR FOUR-FIVE, LUMBAR FIVE-SACRAL ONE POSTERIOR LATERAL FUSION WITH REVISION OF LUMBAR ONE-FIVE HARDWARE AND EXTENSION TO SACRAL ONE AND SACRAL TWO;  Surgeon: Eustace Moore, MD;  Location: [place];  Service: Neurosurgery;  Laterality: N/A;   LUMBAR WOUND DEBRIDEMENT N/A 05/25/2020   Procedure: LUMBAR WOUND IRRIGATION AND DEBRIDEMENT;  Surgeon: Eustace Moore, MD;  Location: [place];  Service: Neurosurgery;  Laterality: N/A;   PERIPHERAL VASCULAR INTERVENTION  08/07/2021   Procedure:  PERIPHERAL VASCULAR INTERVENTION;  Surgeon: Waynetta Sandy, MD;  Location: [place] [facility];  Service: Cardiovascular;;  Rt SFA   PERIPHERAL VASCULAR INTERVENTION Right 10/23/2021   Procedure: PERIPHERAL VASCULAR INTERVENTION;  Surgeon: Waynetta Sandy, MD;  Location: [place] [facility];  Service: Cardiovascular;  Laterality: Right;  SFA   POSTERIOR LUMBAR FUSION 4 LEVEL N/A 04/25/2020   Procedure: POSTERIOR LUMBAR INTERBODY FUSION LUMBAR ONE-TWO, LUMBAR TWO-THREE, LUMBAR THREE-FOUR,LUMBAR FOUR-FIVE.;  Surgeon: Eustace Moore, MD;  Location: [place];  Service: Neurosurgery;  Laterality: N/A;  posterior   TONSILLECTOMY     TOTAL ABDOMINAL HYSTERECTOMY W/ BILATERAL SALPINGOOPHORECTOMY Bilateral 2000   TOTAL HIP ARTHROPLASTY Right 08/22/2018   Procedure: TOTAL HIP ARTHROPLASTY;  Surgeon: Earlie Server, MD;  Location: [facility];  Service: Orthopedics;  Laterality: Right;   VARICOSE VEIN SURGERY  2008   stripping   Social History:  reports that she has been smoking cigarettes. She started smoking about 53 years ago. She has a 12.50 pack-year smoking history. She has never been exposed to tobacco smoke. She has never used smokeless tobacco. She reports current alcohol use of about 1.0 standard drink of alcohol per week. She reports that she does not use drugs.  Allergies  Allergen Reactions   Zestoretic [Lisinopril-Hydrochlorothiazide] Swelling    Angioedema  - Tongue swelling    Codeine Itching   Oxycodone Itching    Patient is taking at this time   Ultram [Tramadol] Itching   Zocor [Simvastatin] Other (See Comments)    Muscle pain    Family History  Problem Relation Age of Onset   Hypertension Sister    Hypothyroidism Sister    Cancer Maternal Grandmother        lung   Hypertension Son    Hypertension Sister    Diabetes Brother        lost toe in 2018 with new diagnosis of DM   Hypertension Son     Prior to Admission medications   Medication Sig Start Date End  Date Taking? Authorizing Provider  allopurinol (ZYLOPRIM) 300 MG tablet Take 1 tablet (300 mg total) by mouth daily. 07/09/19   Mack Hook, MD  amLODipine (NORVASC) 10 MG tablet Take 10 mg by mouth in the morning.    [provider]  aspirin EC 81 MG tablet Take 81 mg by mouth in the morning.    [provider]  atorvastatin (LIPITOR) 80 MG tablet TAKE 1 TABLET BY MOUTH ONCE DAILY WITH  EVENING  MEAL Patient taking differently: Take 80 mg by mouth in the morning. 07/09/19   Mack Hook, MD  blood glucose meter kit and supplies KIT Check sugars twice daily 06/10/18   Mack Hook, MD  carvedilol (COREG CR) 20 MG 24 hr capsule Take 1 capsule (20 mg total) by mouth daily. 07/09/19   Mack Hook, MD  clopidogrel (PLAVIX) 75 MG tablet Take 75 mg by mouth in the morning.    [provider]  cyanocobalamin (VITAMIN B12) 1000 MCG tablet Take 1,000 mcg by mouth daily.    [provider]  ezetimibe (ZETIA) 10 MG tablet TAKE 1 TABLET EVERY DAY (NEED MD APPOINTMENT) 09/27/20  Lorretta Harp, MD  ferrous sulfate 325 (65 FE) MG tablet Take 325 mg by mouth in the morning and at bedtime.    [provider]  gabapentin (NEURONTIN) 300 MG capsule Take 300 mg by mouth 3 (three) times daily. 06/09/21   [provider]  glucose blood test strip Check sugars twice daily 01/31/16   Mack Hook, MD  metFORMIN (GLUCOPHAGE) 500 MG tablet Take 500 mg by mouth 2 (two) times daily with a meal. 04/08/20   [provider]  methylPREDNISolone (MEDROL DOSEPAK) 4 MG TBPK tablet Take as prescribed on packet 04/20/22   Horton, Barbette Hair, MD  oxyCODONE-acetaminophen (PERCOCET) 10-325 MG tablet Take 1 tablet by mouth every 4 (four) hours as needed for pain. Patient taking differently: Take 1 tablet by mouth every 6 (six) hours as needed for pain. 12/03/20   Eustace Moore, MD    Physical Exam: Vitals:   05/07/22 1601 05/07/22 1602  BP:  127/62   Pulse: 89   Resp: 16   Temp: 98.1 F (36.7 C)   TempSrc: Oral   SpO2: 100%   Weight:  59 kg  Height:  '5\' 3"'$  (1.6 m)   Gen Exam:Alert awake-not in any distress HEENT:atraumatic, normocephalic Chest: B/L clear to auscultation anteriorly CVS:S1S2 regular Abdomen:soft non tender, non distended Extremities:no edema.  Mildly tender right shoulder.  Severely tender left shoulder-with associated swelling without any overlying erythema.  Somewhat tender right hip on deep palpation Neurology: Non focal Skin: no rash  Data Reviewed:     Latest Ref Rng & Units 05/07/2022    4:43 PM 04/23/2022   12:53 AM 04/19/2022    8:52 PM  CBC  WBC 4.0 - 10.5 K/uL 9.8  22.6  21.1   Hemoglobin 12.0 - 15.0 g/dL 9.0  11.9  11.1   Hematocrit 36.0 - 46.0 % 29.7  35.7  34.4   Platelets 150 - 400 K/uL 427  461  448         Latest Ref Rng & Units 05/07/2022    4:43 PM 04/23/2022   12:53 AM 04/19/2022    8:52 PM  BMP  Glucose 70 - 99 mg/dL 200  313  238   BUN 8 - 23 mg/dL '9  14  17   '$ Creatinine 0.44 - 1.00 mg/dL 0.88  0.84  0.93   Sodium 135 - 145 mmol/L 134  127  129   Potassium 3.5 - 5.1 mmol/L 3.6  3.6  4.2   Chloride 98 - 111 mmol/L 101  91  99   CO2 22 - 32 mmol/L '25  25  23   '$ Calcium 8.9 - 10.3 mg/dL 8.0  9.0  8.4      Assessment and Plan: Septic arthritis involving left shoulder s/p arthrocentesis 3/9 at orthopedic office (Gram stain positive for gram-positive cocci in pairs/chains) Ongoing mild right shoulder pain Ongoing right hip pain (prosthesis in place) Surprisingly has any fever-No leukocytosis Nontoxic-appearing No indications to start empiric antibiotics right away given stability-will plan to start antibiotics postoperatively to increase yield of culture data.  If in the unlikely event patient becomes hemodynamically unstable/toxic-then we will plan to empirically start vancomycin/Rocephin. Orthopedics recommending IR eval-for arthrocentesis to assess for possible right hip  prosthetic infection (IR consult placed) Orthopedics recommending left shoulder MRI to rule out osteomyelitis  Patient continues to have pain in the right shoulder-although no swelling-will add on a MRI of the right shoulder as well  Orthopedics tentatively planning for left  shoulder washout tomorrow-if right hip aspiration is suggestive of prosthetic joint infection-they are planning to do a washout of right hip tomorrow as well. Will need ID evaluation postoperatively   DM-2 Hold oral hypoglycemic agents SSI while inpatient  HTN Continue amlodipine/Coreg  HLD Statin  History of right carotid endarterectomy History of left carotid artery stenosis Was being scheduled for left carotid endarterectomy-but this has been postponed due to her infection issues.   Per patient-last dose of Plavix was on 3/9 Holding antiplatelet agents for now-this can be resumed post operatively Being continued on statin  History of B12 deficiency Anemia due to chronic disease Follows with Dr. Le[facility] Resume oral vitamin B12/iron supplementation Follow CBC   Advance Care Planning:   Code Status: Full Code   Consults: Orthopedics  Family Communication: None at bedside  Severity of Illness: The appropriate patient status for this patient is INPATIENT. Inpatient status is judged to be reasonable and necessary in order to provide the required intensity of service to ensure the patient's safety. The patient's presenting symptoms, physical exam findings, and initial radiographic and laboratory data in the context of their chronic comorbidities is felt to place them at high risk for further clinical deterioration. Furthermore, it is not anticipated that the patient will be medically stable for discharge from the hospital within 2 midnights of admission.   * I certify that at the point of admission it is my clinical judgment that the patient will require inpatient hospital care spanning beyond 2  midnights from the point of admission due to high intensity of service, high risk for further deterioration and high frequency of surveillance required.*  Author: Oren Binet, MD 05/07/2022 8:05 PM  For on call review [email or web address].

## 2022-05-08 ENCOUNTER — Other Ambulatory Visit: Payer: Self-pay

## 2022-05-08 ENCOUNTER — Inpatient Hospital Stay (HOSPITAL_COMMUNITY): Payer: Medicare HMO

## 2022-05-08 ENCOUNTER — Encounter (HOSPITAL_COMMUNITY)
Admission: EM | Disposition: A | Discharge status: Home Health | Payer: Self-pay | Source: Home / Self Care | Attending: Internal Medicine

## 2022-05-08 ENCOUNTER — Encounter (HOSPITAL_COMMUNITY): Payer: Self-pay | Admitting: Internal Medicine

## 2022-05-08 ENCOUNTER — Inpatient Hospital Stay (HOSPITAL_COMMUNITY): Payer: Medicare HMO | Admitting: Anesthesiology

## 2022-05-08 DIAGNOSIS — M25512 Pain in left shoulder: Secondary | ICD-10-CM

## 2022-05-08 DIAGNOSIS — I10 Essential (primary) hypertension: Secondary | ICD-10-CM | POA: Diagnosis not present

## 2022-05-08 DIAGNOSIS — E1169 Type 2 diabetes mellitus with other specified complication: Secondary | ICD-10-CM | POA: Diagnosis not present

## 2022-05-08 DIAGNOSIS — I2510 Atherosclerotic heart disease of native coronary artery without angina pectoris: Secondary | ICD-10-CM

## 2022-05-08 DIAGNOSIS — M009 Pyogenic arthritis, unspecified: Secondary | ICD-10-CM

## 2022-05-08 DIAGNOSIS — F17210 Nicotine dependence, cigarettes, uncomplicated: Secondary | ICD-10-CM | POA: Diagnosis not present

## 2022-05-08 HISTORY — PX: SHOULDER ARTHROSCOPY: SHX128

## 2022-05-08 HISTORY — PX: INCISION AND DRAINAGE ABSCESS: SHX5864

## 2022-05-08 LAB — SYNOVIAL CELL COUNT + DIFF, W/ CRYSTALS
Crystals, Fluid: NONE SEEN
WBC, Synovial: 33 /mm3 (ref 0–200)

## 2022-05-08 LAB — CBC
HCT: 26.4 % — ABNORMAL LOW (ref 36.0–46.0)
Hemoglobin: 8.1 g/dL — ABNORMAL LOW (ref 12.0–15.0)
MCH: 26.1 pg (ref 26.0–34.0)
MCHC: 30.7 g/dL (ref 30.0–36.0)
MCV: 85.2 fL (ref 80.0–100.0)
Platelets: 382 10*3/uL (ref 150–400)
RBC: 3.1 MIL/uL — ABNORMAL LOW (ref 3.87–5.11)
RDW: 15.9 % — ABNORMAL HIGH (ref 11.5–15.5)
WBC: 8.8 10*3/uL (ref 4.0–10.5)
nRBC: 0 % (ref 0.0–0.2)

## 2022-05-08 LAB — BASIC METABOLIC PANEL
Anion gap: 6 (ref 5–15)
BUN: 10 mg/dL (ref 8–23)
CO2: 24 mmol/L (ref 22–32)
Calcium: 7.9 mg/dL — ABNORMAL LOW (ref 8.9–10.3)
Chloride: 105 mmol/L (ref 98–111)
Creatinine, Ser: 0.84 mg/dL (ref 0.44–1.00)
GFR, Estimated: >60 mL/min (ref >60)
Glucose, Bld: 170 mg/dL — ABNORMAL HIGH (ref 70–99)
Potassium: 3.6 mmol/L (ref 3.5–5.1)
Sodium: 135 mmol/L (ref 135–145)

## 2022-05-08 LAB — GLUCOSE, CAPILLARY
Glucose-Capillary: 103 mg/dL — ABNORMAL HIGH (ref 70–99)
Glucose-Capillary: 105 mg/dL — ABNORMAL HIGH (ref 70–99)
Glucose-Capillary: 159 mg/dL — ABNORMAL HIGH (ref 70–99)
Glucose-Capillary: 169 mg/dL — ABNORMAL HIGH (ref 70–99)
Glucose-Capillary: 170 mg/dL — ABNORMAL HIGH (ref 70–99)
Glucose-Capillary: 194 mg/dL — ABNORMAL HIGH (ref 70–99)
Glucose-Capillary: 210 mg/dL — ABNORMAL HIGH (ref 70–99)
Glucose-Capillary: 276 mg/dL — ABNORMAL HIGH (ref 70–99)

## 2022-05-08 LAB — SURGICAL PCR SCREEN
MRSA, PCR: NEGATIVE
Staphylococcus aureus: NEGATIVE

## 2022-05-08 LAB — HIV ANTIBODY (ROUTINE TESTING W REFLEX): HIV Screen 4th Generation wRfx: NONREACTIVE

## 2022-05-08 SURGERY — ARTHROSCOPY, SHOULDER
Anesthesia: General | Site: Shoulder | Laterality: Left

## 2022-05-08 MED ORDER — LIDOCAINE 2% (20 MG/ML) 5 ML SYRINGE
INTRAMUSCULAR | Status: DC | PRN
  Administered 2022-05-08: 60 mg via INTRAVENOUS

## 2022-05-08 MED ORDER — CEFAZOLIN SODIUM-DEXTROSE 2-4 GM/100ML-% IV SOLN
2.0000 g | INTRAVENOUS | Status: AC | PRN
  Administered 2022-05-08: 2 g via INTRAVENOUS

## 2022-05-08 MED ORDER — METOCLOPRAMIDE HCL 5 MG/ML IJ SOLN: 5.0000 mg | Freq: Three times a day (TID) | INTRAMUSCULAR | Status: DC | PRN

## 2022-05-08 MED ORDER — PHENYLEPHRINE HCL (PRESSORS) 10 MG/ML IV SOLN
INTRAVENOUS | Status: AC
  Filled 2022-05-08: qty 1

## 2022-05-08 MED ORDER — METHOCARBAMOL 500 MG PO TABS
500.0000 mg | ORAL_TABLET | Freq: Four times a day (QID) | ORAL | Status: DC | PRN
  Administered 2022-05-11: 500 mg via ORAL
  Filled 2022-05-08: qty 1

## 2022-05-08 MED ORDER — METOCLOPRAMIDE HCL 10 MG PO TABS: 5.0000 mg | ORAL_TABLET | Freq: Three times a day (TID) | ORAL | Status: DC | PRN

## 2022-05-08 MED ORDER — SODIUM CHLORIDE 0.9 % IR SOLN
Status: DC | PRN
  Administered 2022-05-08: 9000 mL

## 2022-05-08 MED ORDER — CLOPIDOGREL BISULFATE 75 MG PO TABS
75.0000 mg | ORAL_TABLET | Freq: Every day | ORAL | Status: DC
  Administered 2022-05-09 – 2022-05-11 (×3): 75 mg via ORAL
  Filled 2022-05-08 (×3): qty 1

## 2022-05-08 MED ORDER — FENTANYL CITRATE PF 50 MCG/ML IJ SOSY: 50.0000 ug | PREFILLED_SYRINGE | INTRAMUSCULAR | Status: DC

## 2022-05-08 MED ORDER — LIDOCAINE 1% INJECTION FOR CIRCUMCISION: 20.0000 mL | INJECTION | Freq: Once | INTRAVENOUS | Status: DC

## 2022-05-08 MED ORDER — CHLORHEXIDINE GLUCONATE 0.12 % MT SOLN
15.0000 mL | Freq: Once | OROMUCOSAL | Status: AC
  Administered 2022-05-08: 15 mL via OROMUCOSAL

## 2022-05-08 MED ORDER — MIDAZOLAM HCL 2 MG/2ML IJ SOLN: 1.0000 mg | INTRAMUSCULAR | Status: DC

## 2022-05-08 MED ORDER — DOCUSATE SODIUM 100 MG PO CAPS
100.0000 mg | ORAL_CAPSULE | Freq: Two times a day (BID) | ORAL | Status: DC
  Administered 2022-05-09 – 2022-05-10 (×4): 100 mg via ORAL
  Filled 2022-05-08 (×6): qty 1

## 2022-05-08 MED ORDER — PANTOPRAZOLE SODIUM 40 MG PO TBEC
40.0000 mg | DELAYED_RELEASE_TABLET | Freq: Every day | ORAL | Status: DC
  Administered 2022-05-08 – 2022-05-11 (×4): 40 mg via ORAL
  Filled 2022-05-08 (×4): qty 1

## 2022-05-08 MED ORDER — MIDAZOLAM HCL 2 MG/2ML IJ SOLN
INTRAMUSCULAR | Status: AC
  Filled 2022-05-08: qty 2

## 2022-05-08 MED ORDER — PROPOFOL 10 MG/ML IV BOLUS
INTRAVENOUS | Status: DC | PRN
  Administered 2022-05-08: 90 mg via INTRAVENOUS

## 2022-05-08 MED ORDER — OXYCODONE HCL 5 MG PO TABS
5.0000 mg | ORAL_TABLET | ORAL | Status: DC | PRN
  Administered 2022-05-08 – 2022-05-09 (×5): 5 mg via ORAL
  Administered 2022-05-10 – 2022-05-11 (×5): 10 mg via ORAL
  Filled 2022-05-08 (×2): qty 1
  Filled 2022-05-08 (×3): qty 2
  Filled 2022-05-08: qty 1
  Filled 2022-05-08 (×2): qty 2
  Filled 2022-05-08: qty 1
  Filled 2022-05-08: qty 2
  Filled 2022-05-08: qty 1

## 2022-05-08 MED ORDER — PROPOFOL 10 MG/ML IV BOLUS
INTRAVENOUS | Status: AC
  Filled 2022-05-08: qty 20

## 2022-05-08 MED ORDER — HYDROMORPHONE HCL 1 MG/ML IJ SOLN
0.2500 mg | INTRAMUSCULAR | Status: DC | PRN
  Administered 2022-05-08 (×4): 0.5 mg via INTRAVENOUS

## 2022-05-08 MED ORDER — PROMETHAZINE HCL 25 MG/ML IJ SOLN: 6.2500 mg | INTRAMUSCULAR | Status: DC | PRN

## 2022-05-08 MED ORDER — CEFAZOLIN SODIUM-DEXTROSE 1-4 GM/50ML-% IV SOLN
1.0000 g | Freq: Four times a day (QID) | INTRAVENOUS | Status: AC
  Administered 2022-05-08 – 2022-05-09 (×3): 1 g via INTRAVENOUS
  Filled 2022-05-08 (×3): qty 50

## 2022-05-08 MED ORDER — BISACODYL 10 MG RE SUPP: 10.0000 mg | Freq: Every day | RECTAL | Status: DC | PRN

## 2022-05-08 MED ORDER — SUGAMMADEX SODIUM 200 MG/2ML IV SOLN
INTRAVENOUS | Status: DC | PRN
  Administered 2022-05-08: 150 mg via INTRAVENOUS

## 2022-05-08 MED ORDER — LIDOCAINE HCL 1 % IJ SOLN
INTRAMUSCULAR | Status: AC
  Filled 2022-05-08: qty 20

## 2022-05-08 MED ORDER — ACETAMINOPHEN 500 MG PO TABS
1000.0000 mg | ORAL_TABLET | Freq: Four times a day (QID) | ORAL | Status: AC
  Administered 2022-05-08 – 2022-05-09 (×4): 1000 mg via ORAL
  Filled 2022-05-08 (×4): qty 2

## 2022-05-08 MED ORDER — DIPHENHYDRAMINE HCL 12.5 MG/5ML PO ELIX: 12.5000 mg | ORAL_SOLUTION | ORAL | Status: DC | PRN

## 2022-05-08 MED ORDER — HYDROMORPHONE HCL 1 MG/ML IJ SOLN
INTRAMUSCULAR | Status: AC
  Filled 2022-05-08: qty 1

## 2022-05-08 MED ORDER — FENTANYL CITRATE (PF) 100 MCG/2ML IJ SOLN
INTRAMUSCULAR | Status: DC | PRN
  Administered 2022-05-08: 100 ug via INTRAVENOUS

## 2022-05-08 MED ORDER — METHOCARBAMOL 500 MG IVPB - SIMPLE MED
500.0000 mg | Freq: Four times a day (QID) | INTRAVENOUS | Status: DC | PRN
  Administered 2022-05-08: 500 mg via INTRAVENOUS
  Filled 2022-05-08: qty 500

## 2022-05-08 MED ORDER — HYDROCODONE-ACETAMINOPHEN 5-325 MG PO TABS
ORAL_TABLET | ORAL | Status: AC
  Administered 2022-05-08: 1
  Filled 2022-05-08: qty 1

## 2022-05-08 MED ORDER — METHOCARBAMOL 500 MG IVPB - SIMPLE MED
INTRAVENOUS | Status: AC
  Filled 2022-05-08: qty 55

## 2022-05-08 MED ORDER — ATORVASTATIN CALCIUM 40 MG PO TABS
80.0000 mg | ORAL_TABLET | Freq: Every day | ORAL | Status: DC
  Administered 2022-05-09 – 2022-05-10 (×2): 80 mg via ORAL
  Filled 2022-05-08 (×2): qty 2

## 2022-05-08 MED ORDER — PHENYLEPHRINE HCL-NACL 20-0.9 MG/250ML-% IV SOLN
INTRAVENOUS | Status: DC | PRN
  Administered 2022-05-08: 40 ug/min via INTRAVENOUS

## 2022-05-08 MED ORDER — DEXAMETHASONE SODIUM PHOSPHATE 10 MG/ML IJ SOLN
INTRAMUSCULAR | Status: DC | PRN
  Administered 2022-05-08: 10 mg via INTRAVENOUS

## 2022-05-08 MED ORDER — HYDROMORPHONE HCL 1 MG/ML IJ SOLN: 0.5000 mg | INTRAMUSCULAR | Status: DC | PRN

## 2022-05-08 MED ORDER — ONDANSETRON HCL 4 MG PO TABS: 4.0000 mg | ORAL_TABLET | Freq: Four times a day (QID) | ORAL | Status: DC | PRN

## 2022-05-08 MED ORDER — FENTANYL CITRATE PF 50 MCG/ML IJ SOSY
PREFILLED_SYRINGE | INTRAMUSCULAR | Status: AC
  Filled 2022-05-08: qty 2

## 2022-05-08 MED ORDER — ROCURONIUM BROMIDE 10 MG/ML (PF) SYRINGE
PREFILLED_SYRINGE | INTRAVENOUS | Status: DC | PRN
  Administered 2022-05-08: 50 mg via INTRAVENOUS

## 2022-05-08 MED ORDER — LIDOCAINE HCL (PF) 1 % IJ SOLN
30.0000 mL | Freq: Once | INTRAMUSCULAR | Status: AC
  Administered 2022-05-08: 10 mL via INTRADERMAL

## 2022-05-08 MED ORDER — FENTANYL CITRATE (PF) 100 MCG/2ML IJ SOLN
INTRAMUSCULAR | Status: AC
  Filled 2022-05-08: qty 2

## 2022-05-08 MED ORDER — ONDANSETRON HCL 4 MG/2ML IJ SOLN: 4.0000 mg | Freq: Four times a day (QID) | INTRAMUSCULAR | Status: DC | PRN

## 2022-05-08 MED ORDER — OXYCODONE HCL 5 MG PO TABS
10.0000 mg | ORAL_TABLET | ORAL | Status: DC | PRN
  Administered 2022-05-10: 10 mg via ORAL

## 2022-05-08 MED ORDER — ONDANSETRON HCL 4 MG/2ML IJ SOLN
INTRAMUSCULAR | Status: DC | PRN
  Administered 2022-05-08: 4 mg via INTRAVENOUS

## 2022-05-08 MED ORDER — GABAPENTIN 300 MG PO CAPS
300.0000 mg | ORAL_CAPSULE | Freq: Three times a day (TID) | ORAL | Status: DC
  Administered 2022-05-08 – 2022-05-11 (×9): 300 mg via ORAL
  Filled 2022-05-08 (×9): qty 1

## 2022-05-08 MED ORDER — AMISULPRIDE (ANTIEMETIC) 5 MG/2ML IV SOLN: 10.0000 mg | Freq: Once | INTRAVENOUS | Status: DC | PRN

## 2022-05-08 MED ORDER — POLYETHYLENE GLYCOL 3350 17 G PO PACK
17.0000 g | PACK | Freq: Every day | ORAL | Status: DC | PRN
  Administered 2022-05-09: 17 g via ORAL
  Filled 2022-05-08 (×2): qty 1

## 2022-05-08 MED ORDER — LACTATED RINGERS IV SOLN: INTRAVENOUS | Status: DC

## 2022-05-08 SURGICAL SUPPLY — 61 items
APL PRP STRL LF DISP 70% ISPRP (MISCELLANEOUS) ×2
BLADE EXCALIBUR 4.0X13 (MISCELLANEOUS) ×3 IMPLANT
BLADE SURG 15 STRL LF DISP TIS (BLADE) IMPLANT
BLADE SURG 15 STRL SS (BLADE)
BURR OVAL 8 FLU 4.0X13 (MISCELLANEOUS) IMPLANT
BURR OVAL 8 FLU 5.0X13 (MISCELLANEOUS) IMPLANT
CANNULA 5.75X71 LONG (CANNULA) IMPLANT
CANNULA TWIST IN 8.25X7CM (CANNULA) IMPLANT
CANNULA TWIST IN 8.25X9CM (CANNULA) IMPLANT
CHLORAPREP W/TINT 26 (MISCELLANEOUS) ×3 IMPLANT
CLSR STERI-STRIP ANTIMIC 1/2X4 (GAUZE/BANDAGES/DRESSINGS) IMPLANT
CNTNR URN SCR LID CUP LEK RST (MISCELLANEOUS) IMPLANT
CONT SPEC 4OZ STRL OR WHT (MISCELLANEOUS) ×2
DISSECTOR  3.8MM X 13CM (MISCELLANEOUS)
DISSECTOR 3.8MM X 13CM (MISCELLANEOUS) IMPLANT
DRAPE IMP U-DRAPE 54X76 (DRAPES) ×3 IMPLANT
DRAPE INCISE IOBAN 66X45 STRL (DRAPES) ×3 IMPLANT
DRAPE SHOULDER BEACH CHAIR (DRAPES) ×3 IMPLANT
DRAPE U-SHAPE 47X51 STRL (DRAPES) ×3 IMPLANT
DRSG EMULSION OIL 3X3 NADH (GAUZE/BANDAGES/DRESSINGS) ×3 IMPLANT
GAUZE 4X4 16PLY ~~LOC~~+RFID DBL (SPONGE) IMPLANT
GAUZE PAD ABD 8X10 STRL (GAUZE/BANDAGES/DRESSINGS) ×3 IMPLANT
GAUZE SPONGE 4X4 12PLY STRL (GAUZE/BANDAGES/DRESSINGS) ×3 IMPLANT
GLOVE BIO SURGEON STRL SZ7.5 (GLOVE) ×6 IMPLANT
GLOVE BIOGEL PI IND STRL 7.5 (GLOVE) ×3 IMPLANT
GLOVE BIOGEL PI IND STRL 8 (GLOVE) ×3 IMPLANT
GOWN STRL REUS W/ TWL LRG LVL3 (GOWN DISPOSABLE) ×6 IMPLANT
GOWN STRL REUS W/TWL LRG LVL3 (GOWN DISPOSABLE) ×4
KIT BASIN OR (CUSTOM PROCEDURE TRAY) ×3 IMPLANT
KIT PUSHLOCK 2.9 HIP (KITS) IMPLANT
KIT SHOULDER TRACTION (DRAPES) ×3 IMPLANT
KIT TURNOVER KIT A (KITS) IMPLANT
LASSO 90 CVE QUICKPAS (DISPOSABLE) IMPLANT
MANIFOLD NEPTUNE II (INSTRUMENTS) ×3 IMPLANT
NS IRRIG 1000ML POUR BTL (IV SOLUTION) IMPLANT
PACK ARTHROSCOPY WL (CUSTOM PROCEDURE TRAY) ×3 IMPLANT
PORT APPOLLO RF 90DEGREE MULTI (SURGICAL WAND) IMPLANT
SLEEVE SCD COMPRESS KNEE MED (STOCKING) IMPLANT
SLING ARM FOAM STRAP LRG (SOFTGOODS) IMPLANT
SLING ARM FOAM STRAP SML (SOFTGOODS) IMPLANT
SLING ARM IMMOBILIZER XL (CAST SUPPLIES) IMPLANT
SPIKE FLUID TRANSFER (MISCELLANEOUS) IMPLANT
SPONGE T-LAP 4X18 ~~LOC~~+RFID (SPONGE) IMPLANT
SUCTION FRAZIER HANDLE 10FR (MISCELLANEOUS)
SUCTION TUBE FRAZIER 10FR DISP (MISCELLANEOUS) IMPLANT
SUPPORT WRAP ARM LG (MISCELLANEOUS) ×3 IMPLANT
SUT ETHILON 3 0 PS 1 (SUTURE) ×3 IMPLANT
SUT FIBERWIRE #2 38 T-5 BLUE (SUTURE)
SUT MNCRL AB 4-0 PS2 18 (SUTURE) IMPLANT
SUT VIC AB 0 CT1 27 (SUTURE)
SUT VIC AB 0 CT1 27XBRD ANBCTR (SUTURE) IMPLANT
SUTURE FIBERWR #2 38 T-5 BLUE (SUTURE) IMPLANT
SYR BULB EAR ULCER 3OZ GRN STR (SYRINGE) IMPLANT
TAPE CLOTH SURG 6X10 WHT LF (GAUZE/BANDAGES/DRESSINGS) IMPLANT
TAPE LABRALWHITE 1.5X36 (TAPE) IMPLANT
TAPE SUT LABRALTAP WHT/BLK (SUTURE) IMPLANT
TOWEL OR 17X26 10 PK STRL BLUE (TOWEL DISPOSABLE) ×3 IMPLANT
TUBING ARTHROSCOPY IRRIG 16FT (MISCELLANEOUS) ×3 IMPLANT
TUBING CONNECTING 10 (TUBING) IMPLANT
WATER STERILE IRR 1000ML POUR (IV SOLUTION) ×3 IMPLANT
YANKAUER SUCT BULB TIP NO VENT (SUCTIONS) IMPLANT

## 2022-05-08 NOTE — Op Note (Addendum)
05/08/2022  2:44 PM  PATIENT:  April Shaffer    PRE-OPERATIVE DIAGNOSIS:  LEFT SHOULDER INFECTION  POST-OPERATIVE DIAGNOSIS:  Same  PROCEDURE:  ARTHROSCOPY SHOULDER, ARTHROSCOPIC INCISION AND DRAINAGE ABSCESS, DISTAL CLAVICLE EXCISSION, SUBACROMIAL DECOMPRESSION, LOOSE BODY REMOVAL, EXTENSIVE DEBRIDEMENT  SURGEON:  Sheral Apley, MD  ASSISTANT: Levester Fresh, PA-C, he was present and scrubbed throughout the case, critical for completion in a timely fashion, and for retraction, instrumentation, and closure.   ANESTHESIA:   gen  PREOPERATIVE INDICATIONS:  April Shaffer is a  72 y.o. female with a diagnosis of LEFT SHOULDER INFECTION who failed conservative measures and elected for surgical management.    The risks benefits and alternatives were discussed with the patient preoperatively including but not limited to the risks of infection, bleeding, nerve injury, cardiopulmonary complications, the need for revision surgery, among others, and the patient was willing to proceed.  OPERATIVE IMPLANTS: none  OPERATIVE FINDINGS: purulent fluid  BLOOD LOSS: min  COMPLICATIONS: none  TOURNIQUET TIME: none  OPERATIVE PROCEDURE:  Patient was identified in the preoperative holding area and site was marked by me She was transported to the operating theater and placed on the table in supine position taking care to pad all bony prominences. After a preincinduction time out anesthesia was induced. The left upper extremity was prepped and draped in normal sterile fashion and a pre-incision timeout was performed. She received ancef after culture for preoperative antibiotics.   She was placed in the beachchair position again padding all bony prominences.  I made a posterior arthroscopic portal and inserted the arthroscope purulent fluid was expressed from her glenohumeral joint but also communicated with her subacromial space this was collected and sent for culture  I made a lateral portal as  well as an anterior portal.  I then ran 3 L of fluid through the glenohumeral space debriding synovium.    I performed a complete synovectomy using a shaver and the arthroscope I performed an extensive synovectomy of the Glenohumeral joint.    I also debrided her anterior labrum.  Next I inserted the scope into the subacromial space I debrided the some deltoid bursa  After this extensive debridement I turned my attention to the distal clavicle  There was a loose piece of ossified bone off the anterior clavicle I freed this up and removed it using a grabber I extended the portal anteriorly to get this piece out  Next I performed turned my attention to the distal clavicle there was fluid and edema on the MRI and the [facility] joint likely septic so I performed a I&D of this joint as well as distal clavicle excision in order to complete that I did perform a subacromial decompression at the near the [facility] joint  I then used a bur to perform a distal clavicle excision of the distal 8mm of clavicle.   All told I ran 9 L through the joint the she did have a central supraspinatus rotator cuff tear that did communicate the subacromial and glenohumeral joint.  Wounds were closed sterile dressing applied  POST OPERATIVE PLAN: DVT prophylaxis and antibiotics per primary sling for comfort okay to mobilize shoulder

## 2022-05-08 NOTE — Procedures (Signed)
Radiology Procedure Note  Risks and benefits of joint aspiration were discussed with the patient including, but not limited to bleeding, infection, damage to adjacent structures, and low yield.    All of the patient's questions were answered, patient is agreeable to proceed. Consent signed and in chart.  A timeout was performed with all members of the team prior to start of the procedure. Correct patient and correct procedure was confirmed. Allergies were reviewed.   PROCEDURE SUMMARY:  Successful fluoro guided right hip aspiration.  2 mL of  synovial fluid was obtained. Fluid was sent for labs per request.   No immediate complications.  Patient tolerated well.   EBL = trace  Please see full dictation in imaging section of Epic for procedure details.  Armando Gang Cassandre Oleksy PA-C 05/08/2022 10:45 AM

## 2022-05-08 NOTE — Anesthesia Procedure Notes (Signed)
Procedure Name: Intubation Date/Time: 05/08/2022 1:38 PM  Performed by: Gean Maidens, CRNAPre-anesthesia Checklist: Patient identified, Emergency Drugs available, Suction available, Patient being monitored and Timeout performed Patient Re-evaluated:Patient Re-evaluated prior to induction Oxygen Delivery Method: Circle system utilized Preoxygenation: Pre-oxygenation with 100% oxygen Induction Type: IV induction Ventilation: Mask ventilation without difficulty Laryngoscope Size: Mac and 3 Grade View: Grade I Tube type: Oral Tube size: 7.0 mm Number of attempts: 1 Airway Equipment and Method: Stylet Placement Confirmation: ETT inserted through vocal cords under direct vision, positive ETCO2 and breath sounds checked- equal and bilateral Secured at: 21 cm Tube secured with: Tape Dental Injury: Teeth and Oropharynx as per pre-operative assessment

## 2022-05-08 NOTE — Discharge Instructions (Addendum)
Weightbearing: WBAT LUE Insicional and dressing care: Dressings left intact until follow-up and Reinforce dressings as needed Orthopedic device(s):  sling for comfort Showering: Keep dressing dry VTE prophylaxis:  can restart home Plavix , SCDs, ambulation Pain control: continue current regimen Follow - up plan: 1 week after surgery Contact information:  Edmonia Lynch MD, Aggie Moats PA-C

## 2022-05-08 NOTE — TOC Initial Note (Signed)
Transition of Care [facility]) - Initial/Assessment Note    Patient Details  Name: April Shaffer MRN: [id number] Date of Birth: [birth year]  Transition of Care [facility]) CM/SW Contact:    Dessa Phi, RN Phone Number: 05/08/2022, 12:49 PM  Clinical Narrative:  Monitor for d/c plans.                 Expected Discharge Plan: Home/Self Care Barriers to Discharge: Continued Medical Work up   Patient Goals and CMS Choice            Expected Discharge Plan and Services                                              Prior Living Arrangements/Services                       Activities of Daily Living Home Assistive Devices/Equipment: Eyeglasses, CBG Meter, Cane (specify quad or straight) ADL Screening (condition at time of admission) Patient's cognitive ability adequate to safely complete daily activities?: Yes Is the patient deaf or have difficulty hearing?: No Does the patient have difficulty seeing, even when wearing glasses/contacts?: No Does the patient have difficulty concentrating, remembering, or making decisions?: No Patient able to express need for assistance with ADLs?: Yes Does the patient have difficulty dressing or bathing?: No Independently performs ADLs?: Yes (appropriate for developmental age) Does the patient have difficulty walking or climbing stairs?: No Weakness of Legs: None Weakness of Arms/Hands: Left  Permission Sought/Granted                  Emotional Assessment              Admission diagnosis:  Joint infection ([place]) [M00.9] Septic arthritis of shoulder ([place]) [M00.9] Acute pain of left shoulder [M25.512] Patient Active Problem List   Diagnosis Date Noted   Septic arthritis of shoulder ([place]) 05/07/2022   Anemia of chronic disease 04/10/2022   Lumbar pseudoarthrosis 01/24/2022   Occlusion and stenosis of right carotid artery 01/24/2022   Iron deficiency anemia secondary to blood loss (chronic) 01/08/2022   B12  deficiency 08/31/2021   Medication monitoring encounter 12/12/2020   Nausea 12/12/2020   Hammer toes of both feet 11/09/2020   Acute maxillary sinusitis 08/08/2020   Hyperlipidemia 08/08/2020   Pain due to onychomycosis of toenails of both feet 08/08/2020   Therapeutic drug monitoring 06/22/2020   Wound infection after surgery 05/25/2020   Wound drainage 05/24/2020   Local infection of the skin and subcutaneous tissue, unspecified 05/24/2020   Body mass index (BMI) 27.0-27.9, adult 05/10/2020   S/P lumbar fusion 04/25/2020   Microalbuminuria due to type 2 diabetes mellitus ([place]) 08/07/2019   Primary localized osteoarthritis of right hip 08/22/2018   Carpal tunnel syndrome, bilateral 08/06/2018   Type 2 diabetes mellitus with peripheral neuropathy ([place]) 08/06/2018   Degenerative joint disease of right hip 08/06/2018   Preoperative clearance 03/26/2018   Upper back pain on left side 10/30/2016   Encounter for postoperative carotid endarterectomy surveillance 12/07/2015   Varicosities of leg 06/03/2015   Acute left lumbar radiculopathy 04/15/2014   Chronic sciatica of right side 03/17/2013   Diabetic peripheral neuropathy ([place]) 03/17/2013   Coronary artery disease    Carotid artery stenosis s/p CEA 2013 10/16/2011   Carotid artery occlusion without infarction, right 09/17/2011   Carotid bruit 08/28/2011  Gout 04/13/2011   DM type 2 with diabetic peripheral neuropathy ([place]) 04/02/2008   Hyperlipidemia associated with type 2 diabetes mellitus ([place]) 04/02/2008   Essential hypertension 04/02/2008   DEGENERATIVE [place] DISEASE, LUMBOSACRAL SPINE 04/02/2008   PCP:  Sonia Side., FNP Pharmacy:   [facility] [address], [place] - [place] [address] Phone: [phone] Fax: [phone]  [facility] [place], [place] [address] Phone:  [phone] Fax: [phone]     Social Determinants of Health (SDOH) Social History: SDOH Screenings   Food Insecurity: No Food Insecurity (05/08/2022)  Housing: Low Risk  (05/08/2022)  Transportation Needs: No Transportation Needs (05/08/2022)  Utilities: Not At Risk (05/08/2022)  Depression (PHQ2-9): Low Risk  (12/27/2020)  Financial Resource Strain: Low Risk  (08/06/2018)  Physical Activity: Inactive (06/17/2017)  Social Connections: Unknown (08/06/2018)  Stress: No Stress Concern Present (06/17/2017)  Tobacco Use: High Risk (05/08/2022)   SDOH Interventions:     Readmission Risk Interventions     No data to display

## 2022-05-08 NOTE — Interval H&P Note (Signed)
History and Physical Interval Note:  05/08/2022 1:13 PM  April Shaffer  has presented today for surgery, with the diagnosis of LEFT SHOULDER INFECTION.  The various methods of treatment have been discussed with the patient and family. After consideration of risks, benefits and other options for treatment, the patient has consented to  Procedure(s): ARTHROSCOPY SHOULDER (Left) INCISION AND DRAINAGE ABSCESS (Left) as a surgical intervention.  The patient's history has been reviewed, patient examined, no change in status, stable for surgery.  I have reviewed the patient's chart and labs.  Questions were answered to the patient's satisfaction.     She has a confirmed septic L shoulder. I discussed R&B of I&d of this. She wishes to proceed.    [name]

## 2022-05-08 NOTE — Plan of Care (Signed)
  Problem: Education: Goal: Required Educational Video(s) Outcome: Not Applicable

## 2022-05-08 NOTE — Transfer of Care (Signed)
Immediate Anesthesia Transfer of Care Note  Patient: April Shaffer  Procedure(s) Performed: ARTHROSCOPY SHOULDER (Left: Shoulder) ARTHROSCOPIC INCISION AND DRAINAGE ABSCESS, DISTAL CLAVICLE EXCISSION, SUBACROMIAL DECOMPRESSION, LOOSE BODY REMOVAL, EXTENSIVE DEBRIDEMENT (Left)  Patient Location: PACU  Anesthesia Type:General  Level of Consciousness: awake, alert , and oriented  Airway & Oxygen Therapy: Patient Spontanous Breathing and Patient connected to face mask oxygen  Post-op Assessment: Report given to RN and Post -op Vital signs reviewed and stable  Post vital signs: Reviewed and stable  Last Vitals:  Vitals Value Taken Time  BP 153/83 05/08/22 1433  Temp    Pulse    Resp 19 05/08/22 1435  SpO2    Vitals shown include unvalidated device data.  Last Pain:  Vitals:   05/08/22 1209  TempSrc:   PainSc: 0-No pain      Patients Stated Pain Goal: 3 ([id number])  Complications: No notable events documented.

## 2022-05-08 NOTE — Consult Note (Signed)
ORTHOPAEDIC CONSULTATION  REQUESTING PHYSICIAN: Geradine Girt, DO  Chief Complaint: left shoulder pain  HPI: April Shaffer is a 72 y.o. female with a history of wound complications, CAD, PAD, DM 2, HTN, and HLD who complains of left shoulder pain for several weeks that has been worsening. For the past month or so she has had oddly insidious onset right hip pain first, then right shoulder pain, and now left shoulder pain. H/o right THA by Dr. French Ana in 2020 and did well until recently. No history of known autoimmune diseases but her blood work done in our office did show + ANA titers. She has denied any systemic symptoms associated with the painful joints and ROM has remained intact. No relief from multiple rounds of oral and IM steroids. She is on Percocet '10mg'$  q6h for chronic pain. The pain has been severe and even caused her to seek help in the ER on 04/19/22 and 04/23/22.   Imaging shows a large left shoulder joint effusion with synovial enhancement on post-contrast sequences highly suspicious for septic arthritis. Severe acromioclavicular arthropathy with joint effusion and fluid about the superior aspect of the joint concerning for septic arthritis. Edema of the supraspinatus, infraspinatus and subscapularis muscles, likely reactive secondary to infectious/inflammatory process. No evidence of osteomyelitis. Supraspinatus and infraspinatus tendinosis with full-thickness partial width tear of the supraspinatus and high-grade partial-thickness tear of the infraspinatus. Advanced glenohumeral and acromioclavicular osteoarthritis.      Lab work done in our office 05/04/22 shows:  ESR >130 CRP 183.5 ANA + ANA titer 1:160, pattern- nuclear speckled and homogenous ANA titer 1:180, pattern cytoplasmic Left shoulder aspiration done 05/04/22 shows: Gram + cocci in pairs and clusters WBC 107,820 Neutrophils 91% Uric acid normal No crystals      Has been NPO since midnight. No  history of MI, CVA, DVT, PE.  Previously ambulatory without the use of assistive devices until the hip pain start and she has needed a walker since then.  The patient is living at home.    Past Medical History:  Diagnosis Date   Anemia    Arthritis    Blood transfusion without reported diagnosis    Carotid artery narrowing    Coronary artery disease    Cardiac catheterization November 2013: 50% ostial LAD stenosis 50% mid stenosis. 30% disease in the left circumflex.   Diabetes mellitus, type 2 ([place])    Hyperlipidemia    Hypertension    Onychomycosis of toenail 07/31/2016   PAD (peripheral artery disease) (HCC)    Sleep apnea       Had surgery to correct   Thyroid nodule    Past Surgical History:  Procedure Laterality Date   ABDOMINAL AORTOGRAM W/LOWER EXTREMITY N/A 08/07/2021   Procedure: ABDOMINAL AORTOGRAM W/LOWER EXTREMITY;  Surgeon: Waynetta Sandy, MD;  Location: [facility];  Service: Cardiovascular;  Laterality: N/A;   ABDOMINAL AORTOGRAM W/LOWER EXTREMITY Right 10/23/2021   Procedure: ABDOMINAL AORTOGRAM W/LOWER EXTREMITY;  Surgeon: Waynetta Sandy, MD;  Location: [facility];  Service: Cardiovascular;  Laterality: Right;   ABDOMINAL HYSTERECTOMY     APPLICATION OF INTRAOPERATIVE CT SCAN N/A 11/30/2020   Procedure: APPLICATION OF INTRAOPERATIVE CT SCAN;  Surgeon: Eustace Moore, MD;  Location: [facility];  Service: Neurosurgery;  Laterality: N/A;   BACK SURGERY     CARDIAC CATHETERIZATION     CATARACT EXTRACTION, BILATERAL Bilateral 2021   Dr. Sherral Hammers   ENDARTERECTOMY  09/27/2011   Procedure: RIGT  ENDARTERECTOMY CAROTID;  Surgeon: Mal Misty, MD;  Location: [facility];  Service: Vascular;  Laterality: Right;   EPIDURAL BLOCK INJECTION  02/2008   Drs. Eulis Manly   gyn surgery  2004   total hysterectomy for mennorhagia,,salpingoophorectomy   LAMINECTOMY WITH POSTERIOR LATERAL ARTHRODESIS LEVEL 2 N/A 11/30/2020   Procedure: LUMBAR  FOUR-FIVE, LUMBAR FIVE-SACRAL ONE POSTERIOR LATERAL FUSION WITH REVISION OF LUMBAR ONE-FIVE HARDWARE AND EXTENSION TO SACRAL ONE AND SACRAL TWO;  Surgeon: Eustace Moore, MD;  Location: [facility];  Service: Neurosurgery;  Laterality: N/A;   LUMBAR WOUND DEBRIDEMENT N/A 05/25/2020   Procedure: LUMBAR WOUND IRRIGATION AND DEBRIDEMENT;  Surgeon: Eustace Moore, MD;  Location: [facility];  Service: Neurosurgery;  Laterality: N/A;   PERIPHERAL VASCULAR INTERVENTION  08/07/2021   Procedure: PERIPHERAL VASCULAR INTERVENTION;  Surgeon: Waynetta Sandy, MD;  Location: [facility];  Service: Cardiovascular;;  Rt SFA   PERIPHERAL VASCULAR INTERVENTION Right 10/23/2021   Procedure: PERIPHERAL VASCULAR INTERVENTION;  Surgeon: Waynetta Sandy, MD;  Location: [facility];  Service: Cardiovascular;  Laterality: Right;  SFA   POSTERIOR LUMBAR FUSION 4 LEVEL N/A 04/25/2020   Procedure: POSTERIOR LUMBAR INTERBODY FUSION LUMBAR ONE-TWO, LUMBAR TWO-THREE, LUMBAR THREE-FOUR,LUMBAR FOUR-FIVE.;  Surgeon: Eustace Moore, MD;  Location: [facility];  Service: Neurosurgery;  Laterality: N/A;  posterior   TONSILLECTOMY     TOTAL ABDOMINAL HYSTERECTOMY W/ BILATERAL SALPINGOOPHORECTOMY Bilateral 2000   TOTAL HIP ARTHROPLASTY Right 08/22/2018   Procedure: TOTAL HIP ARTHROPLASTY;  Surgeon: Earlie Server, MD;  Location: [facility];  Service: Orthopedics;  Laterality: Right;   VARICOSE VEIN SURGERY  2008   stripping   Social History   Socioeconomic History   Marital status: Significant Other    Spouse name: Not on file   Number of children: 2   Years of education: 82   Highest education level: Not on file  Occupational History   Occupation: housekeeping-retired; 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    Comment: [employer]-retired 07/23/2014  Tobacco Use   Smoking status: Every Day    Packs/day: 0.25    Years: 50.00    Total pack years: 12.50    Types: Cigarettes    Start date: 05/31/1968     Passive exposure: Never   Smokeless tobacco: Never   Tobacco comments:    Smoked x50 years, 1 pack would last 3 days. Currently smoking 4 cigarettes per day  Vaping Use   Vaping Use: Never used  Substance and Sexual Activity   Alcohol use: Yes    Alcohol/week: 1.0 standard drink of alcohol    Types: 1 Cans of beer per week    Comment: 1-2 drinks every other day   Drug use: No   Sexual activity: Not on file  Other Topics Concern   Not on file  Social History Narrative   Lives with long term boyfriend (30 years together)   Both sons live in [place] Strain: Low Risk  (08/06/2018)   Overall Financial Resource Strain (CARDIA)    Difficulty of Paying Living Expenses: Not hard at all  Food Insecurity: No Food Insecurity (05/08/2022)   Hunger Vital Sign    Worried About Running Out of Food in the Last Year: Never true    Ran Out of Food in the Last Year: Never true  Transportation Needs: No Transportation Needs (05/08/2022)   PRAPARE - Transportation    Lack of Transportation (  Medical): No    Lack of Transportation (Non-Medical): No  Physical Activity: Inactive (06/17/2017)   Exercise Vital Sign    Days of Exercise per Week: 0 days    Minutes of Exercise per Session: 0 min  Stress: No Stress Concern Present (06/17/2017)   [place]    Feeling of Stress : Not at all  Social Connections: Unknown (08/06/2018)   Social Connection and Isolation Panel [NHANES]    Frequency of Communication with Friends and Family: Not on file    Frequency of Social Gatherings with Friends and Family: Not on file    Attends Religious Services: Not on file    Active Member of Clubs or Organizations: Not on file    Attends Archivist Meetings: Not on file    Marital Status: Living with partner   Family History  Problem Relation Age of Onset   Hypertension Sister     Hypothyroidism Sister    Cancer Maternal Grandmother        lung   Hypertension Son    Hypertension Sister    Diabetes Brother        lost toe in 2018 with new diagnosis of DM   Hypertension Son    Allergies  Allergen Reactions   Zestoretic [Lisinopril-Hydrochlorothiazide] Swelling    Angioedema  - Tongue swelling    Codeine Itching   Oxycodone Itching    Patient is taking at this time   Ultram [Tramadol] Itching   Zocor [Simvastatin] Other (See Comments)    Muscle pain   Prior to Admission medications   Medication Sig Start Date End Date Taking? Authorizing Provider  acetaminophen (TYLENOL) 500 MG tablet Take 500 mg by mouth 2 (two) times daily as needed for moderate pain.   Yes [provider]  allopurinol (ZYLOPRIM) 300 MG tablet Take 1 tablet (300 mg total) by mouth daily. Patient taking differently: Take 300 mg by mouth daily as needed (gout). 07/09/19  Yes Mack Hook, MD  amLODipine (NORVASC) 10 MG tablet Take 10 mg by mouth in the morning.   Yes [provider]  aspirin EC 81 MG tablet Take 81 mg by mouth in the morning.   Yes [provider]  atorvastatin (LIPITOR) 80 MG tablet TAKE 1 TABLET BY MOUTH ONCE DAILY WITH  EVENING  MEAL Patient taking differently: Take 80 mg by mouth in the morning. 07/09/19  Yes Mack Hook, MD  carvedilol (COREG CR) 20 MG 24 hr capsule Take 1 capsule (20 mg total) by mouth daily. 07/09/19  Yes Mack Hook, MD  clopidogrel (PLAVIX) 75 MG tablet Take 75 mg by mouth in the morning.   Yes [provider]  cyanocobalamin (VITAMIN B12) 1000 MCG tablet Take 1,000 mcg by mouth daily.   Yes [provider]  ezetimibe (ZETIA) 10 MG tablet TAKE 1 TABLET EVERY DAY (NEED MD APPOINTMENT) Patient taking differently: Take 10 mg by mouth daily. 09/27/20  Yes Lorretta Harp, MD  ferrous sulfate 325 (65 FE) MG tablet Take 325 mg by mouth daily.   Yes [provider]  gabapentin  (NEURONTIN) 300 MG capsule Take 300 mg by mouth 3 (three) times daily. 06/09/21  Yes [provider]  metFORMIN (GLUCOPHAGE) 500 MG tablet Take 500 mg by mouth in the morning and at bedtime. 04/08/20  Yes [provider]  oxyCODONE-acetaminophen (PERCOCET) 10-325 MG tablet Take 1 tablet by mouth every 4 (four) hours as needed for pain. Patient  taking differently: Take 1 tablet by mouth every 6 (six) hours as needed for pain. 12/03/20  Yes Eustace Moore, MD  glucose blood test strip Check sugars twice daily 01/31/16   Mack Hook, MD  methylPREDNISolone (MEDROL DOSEPAK) 4 MG TBPK tablet Take as prescribed on packet Patient not taking: Reported on 05/08/2022 04/20/22   Merryl Hacker, MD   MR Shoulder Right W Wo Contrast  Result Date: 05/07/2022 CLINICAL DATA:  Right shoulder pain EXAM: MRI OF THE RIGHT SHOULDER WITHOUT AND WITH CONTRAST TECHNIQUE: Multiplanar, multisequence MR imaging of the radiograph dated April 10, 2018 for shoulder was performed before and after the administration of intravenous contrast. CONTRAST:  29m GADAVIST GADOBUTROL 1 MMOL/ML IV SOLN COMPARISON:  None Available. FINDINGS: Rotator cuff: Supraspinatus tendinosis with low-grade partial-thickness (less than 50%) insertional tear at the footprint. Infraspinatus tendon is intact. Teres minor tendon is intact. Subscapularis tendon is intact. Muscles: No muscle atrophy or edema. No intramuscular fluid collection or hematoma. Biceps Long Head: Intraarticular and extraarticular portions of the biceps tendon are intact. Acromioclavicular Joint: Mild arthropathy of the acromioclavicular joint. No subacromial/subdeltoid bursal fluid. Glenohumeral Joint: No joint effusion. Articular cartilage thinning. No full-thickness cartilage defect. Labrum: Degenerative changes of the labrum with a chronic degenerative tear of the anteroinferior labrum. Bones: No fracture or dislocation. No aggressive osseous lesion. Other: No  fluid collection or hematoma. IMPRESSION: 1. Supraspinatus tendinosis with low-grade partial-thickness (less than 50%) insertional tear at the footprint. 2. Degenerative changes of the labrum with a chronic degenerative tear of the anteroinferior labrum. 3. Mild acromioclavicular osteoarthritis . 4. No evidence of fracture or dislocation. 5. No joint effusion or evidence of septic arthritis . Electronically Signed   By: IKeane PoliceD.O.   On: 05/07/2022 23:27   MR SHOULDER LEFT W WO CONTRAST  Result Date: 05/07/2022 CLINICAL DATA:  Bilateral shoulder pain, the left shoulder possibly septic. EXAM: MRI OF THE LEFT SHOULDER WITHOUT AND WITH CONTRAST TECHNIQUE: Multiplanar, multisequence MR imaging of the shoulder was performed before and after the administration of intravenous contrast. CONTRAST:  681mGADAVIST GADOBUTROL 1 MMOL/ML IV SOLN COMPARISON:  Radiograph performed earlier on the same date. FINDINGS: Rotator cuff: Supraspinatus tendinosis with full-thickness partial width tear measuring a proximally 9 mm in AP dimension. Infraspinatus tendinosis with high-grade partial-thickness tear at the footprint. Teres minor tendon is intact. Subscapularis tendon is intact. Muscles: There is intramuscular edema with enhancement on the postcontrast sequences highly concerning for infectious/inflammatory process Biceps Long Head: Intraarticular and extraarticular portions of the biceps tendon are intact. Acromioclavicular Joint: Severe arthropathy of the acromioclavicular joint with joint effusion. There is fluid about the superior aspect of the acromioclavicular joint. Glenohumeral Joint: Large shoulder joint effusion with synovial enhancement on post-contrast sequences highly suspicious for septic arthritis. Advanced articular cartilage thinning. No full-thickness cartilage defect Labrum: Degenerative changes of the labrum without evidence of acute tear. Bones: No evidence of osteomyelitis. Bulky glenohumeral and  acromioclavicular marginal osteophytes. No fracture or dislocation Other: Small fluid collection about the superior aspect of the acromioclavicular joint. IMPRESSION: 1. Large left shoulder joint effusion with synovial enhancement on post-contrast sequences highly suspicious for septic arthritis. Orthopedic consultation and joint aspiration for further management is suggested. 2. Severe acromioclavicular arthropathy with joint effusion and fluid about the superior aspect of the joint concerning for septic arthritis. 3. Edema of the supraspinatus, infraspinatus and subscapularis muscles, likely reactive secondary to infectious/inflammatory process. 4. No evidence of osteomyelitis. 5. Supraspinatus and infraspinatus tendinosis with full-thickness partial width  tear of the supraspinatus and high-grade partial-thickness tear of the infraspinatus. 6. Advanced glenohumeral and acromioclavicular osteoarthritis. Electronically Signed   By: Keane Police D.O.   On: 05/07/2022 23:14   DG Shoulder Left  Result Date: 05/07/2022 CLINICAL DATA:  Pain EXAM: LEFT SHOULDER - 2+ VIEW COMPARISON:  None Available. FINDINGS: There is no evidence of fracture or dislocation. There is glenohumeral and acromioclavicular joint space narrowing and osteophyte formation compatible with degenerative change. Soft tissues are unremarkable. IMPRESSION: 1. No acute fracture or dislocation. 2. Degenerative changes in the glenohumeral and acromioclavicular joints. Electronically Signed   By: Ronney Asters M.D.   On: 05/07/2022 17:21    Positive ROS: All other systems have been reviewed and were otherwise negative with the exception of those mentioned in the HPI and as above.  Objective: Labs cbc Recent Labs    05/07/22 1643 05/08/22 0106  WBC 9.8 8.8  HGB 9.0* 8.1*  HCT 29.7* 26.4*  PLT 427* 382    Labs inflam Recent Labs    05/07/22 1700  CRP 19.6*    Labs coag No results for input(s): "INR", "PTT" in the last 72  hours.  Invalid input(s): "PT"  Recent Labs    05/07/22 1643 05/08/22 0106  NA 134* 135  K 3.6 3.6  CL 101 105  CO2 25 24  GLUCOSE 200* 170*  BUN 9 10  CREATININE 0.88 0.84  CALCIUM 8.0* 7.9*    Physical Exam: Vitals:   05/08/22 0414 05/08/22 0756  BP: (!) 134/53 121/67  Pulse: 75 69  Resp:  18  Temp: 98.5 F (36.9 C) 98.3 F (36.8 C)  SpO2: 98% 100%   General: Alert, no acute distress.  Laying in bed, calm Mental status: Alert and Oriented x3 Neurologic: Speech Clear and organized, no gross focal findings or movement disorder appreciated. Respiratory: No cyanosis, no use of accessory musculature Cardiovascular: No pedal edema GI: Abdomen is soft and non-tender, non-distended. Skin: Warm and dry.  No lesions in the area of chief complaint. Extremities: Warm and well perfused w/o edema Psychiatric: Patient is competent for consent with normal mood and affect  MUSCULOSKELETAL:  L shoulder mildly TTP, AC joint very TTP and visibly edematous, no erythema, full ROM, NVI R shoulder minimally TTP, full ROM, NVI R hip non-TTP currently, near full ROM  Other extremities are atraumatic with painless ROM and NVI.  Assessment / Plan: Principal Problem:   Septic arthritis of shoulder ([facility]) Active Problems:   DM type 2 with diabetic peripheral neuropathy ([facility])   Hyperlipidemia associated with type 2 diabetes mellitus ([place])   Essential hypertension   Carotid artery occlusion without infarction, right    Will plan to take the patient to the OR likely this afternoon for a left shoulder I&D. We will send intra-op cultures so please hold ABX before then. Keep NPO.    Weightbearing: WBAT RUE, LUE, RLE, and LLE Showering: hold VTE prophylaxis:  on hold for surgery   Pain control: Tylenol, Vicodin, Dilaudid PRN Follow - up plan:  TBD Contact information:  Edmonia Lynch MD, [facility] PA-C  Britt Bottom PA-C Office [phone] 05/08/2022 10:10 AM

## 2022-05-08 NOTE — Op Note (Incomplete Revision)
05/08/2022  2:44 PM  PATIENT:  April Shaffer    PRE-OPERATIVE DIAGNOSIS:  LEFT SHOULDER INFECTION  POST-OPERATIVE DIAGNOSIS:  Same  PROCEDURE:  ARTHROSCOPY SHOULDER, ARTHROSCOPIC INCISION AND DRAINAGE ABSCESS, DISTAL CLAVICLE EXCISSION, SUBACROMIAL DECOMPRESSION, LOOSE BODY REMOVAL, EXTENSIVE DEBRIDEMENT  SURGEON:  [name], MD  ASSISTANT: Aggie Moats, PA-C, he was present and scrubbed throughout the case, critical for completion in a timely fashion, and for retraction, instrumentation, and closure.   ANESTHESIA:   gen  PREOPERATIVE INDICATIONS:  [name] is a  72 y.o. female with a diagnosis of LEFT SHOULDER INFECTION who failed conservative measures and elected for surgical management.    The risks benefits and alternatives were discussed with the patient preoperatively including but not limited to the risks of infection, bleeding, nerve injury, cardiopulmonary complications, the need for revision surgery, among others, and the patient was willing to proceed.  OPERATIVE IMPLANTS: none  OPERATIVE FINDINGS: purulent fluid  BLOOD LOSS: min  COMPLICATIONS: none  TOURNIQUET TIME: none  OPERATIVE PROCEDURE:  Patient was identified in the preoperative holding area and site was marked by me She was transported to the operating theater and placed on the table in supine position taking care to pad all bony prominences. After a preincinduction time out anesthesia was induced. The left upper extremity was prepped and draped in normal sterile fashion and a pre-incision timeout was performed. She received ancef after culture for preoperative antibiotics.   She was placed in the beachchair position again padding all bony prominences.  I made a posterior arthroscopic portal and inserted the arthroscope purulent fluid was expressed from her glenohumeral joint but also communicated with her subacromial space this was collected and sent for culture  I made a lateral portal as  well as an anterior portal.  I then ran 3 L of fluid through the glenohumeral space debriding synovium.    I performed a complete synovectomy   I also debrided her anterior labrum.  Next I inserted the scope into the subacromial space I debrided the some deltoid bursa  After this extensive debridement I turned my attention to the distal clavicle  There was a loose piece of ossified bone off the anterior clavicle I freed this up and removed it using a grabber I extended the portal anteriorly to get this piece out  Next I performed turned my attention to the distal clavicle there was fluid and edema on the MRI and the [facility] joint likely septic so I performed a I&D of this joint as well as distal clavicle excision in order to complete that I did perform a subacromial decompression at the near the [facility] joint  I then used a bur to perform a distal clavicle excision  All told I ran 9 L through the joint the she did have a central supraspinatus rotator cuff tear that did communicate the subacromial and glenohumeral joint.  Wounds were closed sterile dressing applied  POST OPERATIVE PLAN: DVT prophylaxis and antibiotics per primary sling for comfort okay to mobilize shoulder

## 2022-05-08 NOTE — Progress Notes (Signed)
PROGRESS NOTE    April Shaffer  [id number] DOB: [birth year] DOA: 05/07/2022 PCP: Sonia Side., FNP    Brief Narrative:  April Shaffer is a 72 y.o. female with medical history significant of DM-2, HTN, HLD, right carotid artery stenosis-who was referred from orthopedic office for left shoulder septic arthritis.   Per patient-since Valentine's Day/February 14-patient has had right shoulder pain.  Pain then gradually migrated from right shoulder to her left shoulder.  Her right shoulder is currently pain-free.  For the past 2 weeks-she has had worsening left shoulder pain.  She was seen at her orthopedics office where she was noted to have significant swelling of the left shoulder.  She had arthrocentesis done on 3/9-Gram stain came back positive for gram-positive cocci in chains and clusters-she was then sent to the ED for inpatient evaluation/treatment.   Per patient-she also has had worsening right hip pain for the past several days.  She has a prosthesis in her right hip.   She did have a episode of chills this morning but has not had fever   Assessment and Plan: Septic arthritis involving left shoulder s/p arthrocentesis 3/9 at orthopedic office (Gram stain positive for gram-positive cocci in pairs/chains) Ongoing mild right shoulder pain Ongoing right hip pain (prosthesis in place) no fever-No leukocytosis Nontoxic-appearing No indications to start empiric antibiotics right away given stability-will plan to start antibiotics postoperatively to increase yield of culture data.  Orthopedics recommending IR eval-for arthrocentesis to assess for possible right hip prosthetic infection (IR consult placed by admitting MD) Orthopedics tentatively planning for left shoulder washout tomorrow-if right hip aspiration is suggestive of prosthetic joint infection-they are planning to do a washout of right hip tomorrow as well. Will need ID evaluation postoperatively    DM-2 Hold oral  hypoglycemic agents SSI while inpatient   HTN Continue amlodipine/Coreg   HLD Statin   History of right carotid endarterectomy History of left carotid artery stenosis Was being scheduled for left carotid endarterectomy-but this has been postponed due to her infection issues.   Per patient-last dose of Plavix was on 3/9 Holding antiplatelet agents for now-this can be resumed post operatively - continue  statin   History of B12 deficiency Anemia due to chronic disease Follows with Dr. [facility] -hold B12 as >1000 on lab   DVT prophylaxis: SCDs Start: 05/07/22 2251 Sequential compression device to OR Start: 05/07/22 2013    Code Status: Full Code   Disposition Plan:  Level of care: Telemetry Status is: Inpatient Remains inpatient appropriate because: needs to go to the OR    Consultants:  Ortho IR   Subjective: Has done IV abx at home in the past-- followed with Dr. Linus Salmons after an infection after a spinal surgery  Objective: Vitals:   05/08/22 0000 05/08/22 0414 05/08/22 0756 05/08/22 1147  BP: 137/86 (!) 134/53 121/67 (!) 118/58  Pulse: 87 75 69 82  Resp: '16  18 18  '$ Temp: 98.2 F (36.8 C) 98.5 F (36.9 C) 98.3 F (36.8 C) 97.7 F (36.5 C)  TempSrc: Oral Oral Oral Oral  SpO2: 100% 98% 100%   Weight:      Height:        Intake/Output Summary (Last 24 hours) at 05/08/2022 1205 Last data filed at 05/08/2022 1130 Gross per 24 hour  Intake 0 ml  Output 450 ml  Net -450 ml   Filed Weights   05/07/22 1602  Weight: 59 kg    Examination:   General:  Appearance:    Well developed, well nourished female in no acute distress     Lungs:      respirations unlabored  Heart:    Normal heart rate. Normal rhythm. No murmurs, rubs, or gallops.    MS:   All extremities are intact.    Neurologic:   Awake, alert, oriented x 3. No apparent focal neurological           defect.        Data Reviewed: I have personally reviewed following labs and imaging  studies  CBC: Recent Labs  Lab 05/07/22 1643 05/08/22 0106  WBC 9.8 8.8  NEUTROABS 6.8  --   HGB 9.0* 8.1*  HCT 29.7* 26.4*  MCV 85.8 85.2  PLT 427* [id number]   Basic Metabolic Panel: Recent Labs  Lab 05/07/22 1643 05/08/22 0106  NA 134* 135  K 3.6 3.6  CL 101 105  CO2 25 24  GLUCOSE 200* 170*  BUN 9 10  CREATININE 0.88 0.84  CALCIUM 8.0* 7.9*   GFR: Estimated Creatinine Clearance: 52.3 mL/min (by C-G formula based on SCr of 0.84 mg/dL). Liver Function Tests: Recent Labs  Lab 05/07/22 1643  AST 16  ALT 13  ALKPHOS 123  BILITOT 0.4  PROT 8.5*  ALBUMIN 2.3*   No results for input(s): "LIPASE", "AMYLASE" in the last 168 hours. No results for input(s): "AMMONIA" in the last 168 hours. Coagulation Profile: No results for input(s): "INR", "PROTIME" in the last 168 hours. Cardiac Enzymes: No results for input(s): "CKTOTAL", "CKMB", "CKMBINDEX", "TROPONINI" in the last 168 hours. BNP (last 3 results) No results for input(s): "PROBNP" in the last 8760 hours. HbA1C: No results for input(s): "HGBA1C" in the last 72 hours. CBG: Recent Labs  Lab 05/08/22 0008 05/08/22 0423 05/08/22 0754 05/08/22 1141  GLUCAP 169* 103* 105* 159*   Lipid Profile: No results for input(s): "CHOL", "HDL", "LDLCALC", "TRIG", "CHOLHDL", "LDLDIRECT" in the last 72 hours. Thyroid Function Tests: No results for input(s): "TSH", "T4TOTAL", "FREET4", "T3FREE", "THYROIDAB" in the last 72 hours. Anemia Panel: Recent Labs    05/07/22 1647  VITAMINB12 1,253*  FOLATE 5.8*  FERRITIN 1,046*  TIBC 155*  IRON 21*  RETICCTPCT 0.8   Sepsis Labs: Recent Labs  Lab 05/07/22 1631 05/07/22 1945  LATICACIDVEN 3.0* 1.8    Recent Results (from the past 240 hour(s))  Culture, blood (routine x 2)     Status: None (Preliminary result)   Collection Time: 05/07/22  4:43 PM   Specimen: BLOOD  Result Value Ref Range Status   Specimen Description   Final    BLOOD RIGHT ANTECUBITAL Performed at [facility], [place] [address], [place], [place] [address]    Special Requests   Final    BOTTLES DRAWN AEROBIC AND ANAEROBIC Blood Culture adequate volume Performed at [place] [address], [place], [place] [address]    Culture   Final    NO GROWTH < 12 HOURS Performed at [facility] [address], [place], [place] [address]    Report Status PENDING  Incomplete  Culture, blood (routine x 2)     Status: None (Preliminary result)   Collection Time: 05/07/22  5:01 PM   Specimen: BLOOD  Result Value Ref Range Status   Specimen Description   Final    BLOOD LEFT ANTECUBITAL Performed at [facility], [place] [address], [place], [place] [address]    Special Requests   Final  BOTTLES DRAWN AEROBIC AND ANAEROBIC Blood Culture adequate volume Performed at [place] [address] [place], [place] [address]    Culture   Final    NO GROWTH < 12 HOURS Performed at [place] [address] [place], [place] [address]    Report Status PENDING  Incomplete  Surgical pcr screen     Status: None   Collection Time: 05/08/22  8:51 AM   Specimen: Nasal Mucosa; Nasal Swab  Result Value Ref Range Status   MRSA, PCR NEGATIVE NEGATIVE Final   Staphylococcus aureus NEGATIVE NEGATIVE Final    Comment: (NOTE) The Xpert SA Assay (FDA approved for NASAL specimens in patients 46 years of age and older), is one component of a comprehensive surveillance program. It is not intended to diagnose infection nor to guide or monitor treatment. Performed at [facility], [place] [address] [place], [place] [address]          Radiology Studies: DG FLUORO GUIDED NEEDLE [facility] ASPIRATION/INJECTION LOC  Result Date: 05/08/2022 INDICATION: 72 year old female status post right total hip replacement in 2020 presents with septic arthritis of shoulder and right hip pain. Request for fluoro guided right hip aspiration. EXAM:  ARTHROCENTESIS OF RIGHT HIP JOINT UNDER FLUOROSCOPIC GUIDANCE COMPARISON:  CT 04/23/2022. FLUOROSCOPY TIME:  1.5 mGy COMPLICATIONS: None immediate. PROCEDURE: Informed written consent was obtained from the patient after discussion of the risks, benefits and alternatives to treatment. The patient was placed supine on the fluoroscopy table and the right extremity was placed in a slight degree of internal rotation. The right hip was localized with fluoroscopy. The skin overlying the anterior aspect of the hip was prepped and draped in usual sterile fashion. A 3.5 inch 18 gauge spinal needle was advanced into the hip joint at the lateral aspect of the femoral head-neck junction, after the overlying soft tissues were anesthetized with 1% lidocaine. Several attempts were made to aspirate the right hip joint but unsuccessful. Dr. Chancy Milroy was called to the procedure room for assistance. Approximately 1.5 mL of synovial fluid was aspirated from the right hip joint insert into the laboratory for analysis. A fluoroscopic image was saved and sent to PACs. The needle was removed and a dressing was placed. The patient tolerated procedure well without immediate postprocedural complication. IMPRESSION: Successful fluoroscopic guided joint aspiration of the right hip. This procedure was performed by Durenda Guthrie, PA-C under the supervision of Maurine Simmering, MD. Electronically Signed   By: Maurine Simmering M.D.   On: 05/08/2022 11:18   MR Shoulder Right W Wo Contrast  Result Date: 05/07/2022 CLINICAL DATA:  Right shoulder pain EXAM: MRI OF THE RIGHT SHOULDER WITHOUT AND WITH CONTRAST TECHNIQUE: Multiplanar, multisequence MR imaging of the radiograph dated April 10, 2018 for shoulder was performed before and after the administration of intravenous contrast. CONTRAST:  54m GADAVIST GADOBUTROL 1 MMOL/ML IV SOLN COMPARISON:  None Available. FINDINGS: Rotator cuff: Supraspinatus tendinosis with low-grade partial-thickness (less than 50%)  insertional tear at the footprint. Infraspinatus tendon is intact. Teres minor tendon is intact. Subscapularis tendon is intact. Muscles: No muscle atrophy or edema. No intramuscular fluid collection or hematoma. Biceps Long Head: Intraarticular and extraarticular portions of the biceps tendon are intact. Acromioclavicular Joint: Mild arthropathy of the acromioclavicular joint. No subacromial/subdeltoid bursal fluid. Glenohumeral Joint: No joint effusion. Articular cartilage thinning. No full-thickness cartilage defect. Labrum: Degenerative changes of the labrum with a chronic degenerative tear of the anteroinferior labrum. Bones: No fracture or dislocation. No  aggressive osseous lesion. Other: No fluid collection or hematoma. IMPRESSION: 1. Supraspinatus tendinosis with low-grade partial-thickness (less than 50%) insertional tear at the footprint. 2. Degenerative changes of the labrum with a chronic degenerative tear of the anteroinferior labrum. 3. Mild acromioclavicular osteoarthritis . 4. No evidence of fracture or dislocation. 5. No joint effusion or evidence of septic arthritis . Electronically Signed   By: Keane Police D.O.   On: 05/07/2022 23:27   MR SHOULDER LEFT W WO CONTRAST  Result Date: 05/07/2022 CLINICAL DATA:  Bilateral shoulder pain, the left shoulder possibly septic. EXAM: MRI OF THE LEFT SHOULDER WITHOUT AND WITH CONTRAST TECHNIQUE: Multiplanar, multisequence MR imaging of the shoulder was performed before and after the administration of intravenous contrast. CONTRAST:  45m GADAVIST GADOBUTROL 1 MMOL/ML IV SOLN COMPARISON:  Radiograph performed earlier on the same date. FINDINGS: Rotator cuff: Supraspinatus tendinosis with full-thickness partial width tear measuring a proximally 9 mm in AP dimension. Infraspinatus tendinosis with high-grade partial-thickness tear at the footprint. Teres minor tendon is intact. Subscapularis tendon is intact. Muscles: There is intramuscular edema with  enhancement on the postcontrast sequences highly concerning for infectious/inflammatory process Biceps Long Head: Intraarticular and extraarticular portions of the biceps tendon are intact. Acromioclavicular Joint: Severe arthropathy of the acromioclavicular joint with joint effusion. There is fluid about the superior aspect of the acromioclavicular joint. Glenohumeral Joint: Large shoulder joint effusion with synovial enhancement on post-contrast sequences highly suspicious for septic arthritis. Advanced articular cartilage thinning. No full-thickness cartilage defect Labrum: Degenerative changes of the labrum without evidence of acute tear. Bones: No evidence of osteomyelitis. Bulky glenohumeral and acromioclavicular marginal osteophytes. No fracture or dislocation Other: Small fluid collection about the superior aspect of the acromioclavicular joint. IMPRESSION: 1. Large left shoulder joint effusion with synovial enhancement on post-contrast sequences highly suspicious for septic arthritis. Orthopedic consultation and joint aspiration for further management is suggested. 2. Severe acromioclavicular arthropathy with joint effusion and fluid about the superior aspect of the joint concerning for septic arthritis. 3. Edema of the supraspinatus, infraspinatus and subscapularis muscles, likely reactive secondary to infectious/inflammatory process. 4. No evidence of osteomyelitis. 5. Supraspinatus and infraspinatus tendinosis with full-thickness partial width tear of the supraspinatus and high-grade partial-thickness tear of the infraspinatus. 6. Advanced glenohumeral and acromioclavicular osteoarthritis. Electronically Signed   By: IKeane PoliceD.O.   On: 05/07/2022 23:14   DG Shoulder Left  Result Date: 05/07/2022 CLINICAL DATA:  Pain EXAM: LEFT SHOULDER - 2+ VIEW COMPARISON:  None Available. FINDINGS: There is no evidence of fracture or dislocation. There is glenohumeral and acromioclavicular joint space  narrowing and osteophyte formation compatible with degenerative change. Soft tissues are unremarkable. IMPRESSION: 1. No acute fracture or dislocation. 2. Degenerative changes in the glenohumeral and acromioclavicular joints. Electronically Signed   By: ARonney AstersM.D.   On: 05/07/2022 17:21        Scheduled Meds:  acetaminophen  1,000 mg Oral Once   [MAR Hold] amLODipine  10 mg Oral q AM   [MAR Hold] carvedilol  20 mg Oral Daily   [MAR Hold] ezetimibe  10 mg Oral Daily   [MAR Hold] ferrous sulfate  325 mg Oral Q breakfast   [MAR Hold] insulin aspart  0-9 Units Subcutaneous Q4H   [MAR Hold] polyethylene glycol  17 g Oral Daily   povidone-iodine  2 Application Topical Once   [MAR Hold] senna  1 tablet Oral BID   [MAR Hold] sodium chloride flush  3 mL Intravenous Q12H   Continuous Infusions:  [  MAR Hold] sodium chloride     [MAR Hold]  ceFAZolin (ANCEF) IV       LOS: 1 day    Time spent: 45 minutes spent on chart review, discussion with nursing staff, consultants, updating family and interview/physical exam; more than 50% of that time was spent in counseling and/or coordination of care.    Geradine Girt, DO [facility] Available via Epic secure chat 7am-7pm After these hours, please refer to coverage provider listed on [email or web address] 05/08/2022, 12:05 PM

## 2022-05-08 NOTE — Anesthesia Preprocedure Evaluation (Addendum)
Anesthesia Evaluation  Patient identified by MRN, date of birth, ID band Patient awake    Reviewed: Allergy & Precautions, NPO status , Patient's Chart, lab work & pertinent test results  History of Anesthesia Complications Negative for: history of anesthetic complications  Airway Mallampati: II  TM Distance: >3 FB Neck ROM: Full    Dental no notable dental hx. (+) Missing, Poor Dentition, Dental Advisory Given,    Pulmonary neg shortness of breath, neg sleep apnea, neg COPD, neg recent URI, Current Smoker and Patient abstained from smoking. Covid-19 Nucleic Acid Test Results Lab Results      Component                Value               Date                      [place]              NEGATIVE            04/21/2020                [place]              NEGATIVE            08/19/2018              Pulmonary exam normal breath sounds clear to auscultation       Cardiovascular hypertension, Pt. on medications and Pt. on home beta blockers (-) angina + CAD  Normal cardiovascular exam Rhythm:Regular Rate:Normal  Stress MPS   Nuclear stress EF: 68%.  The left ventricular ejection fraction is hyperdynamic (>65%).  There was no ST segment deviation noted during stress.  The study is normal.  This is a low risk study.   Normal pharmacologic nuclear study with no evidence for a prior infarct or ischemia. Since the prior study in 2013 ischemia is no longer present.    Neuro/Psych neg Seizures  Neuromuscular disease  negative psych ROS   GI/Hepatic negative GI ROS, Neg liver ROS,,,Lab Results      Component                Value               Date                      ALT                      18                  08/04/2019                AST                      17                  08/04/2019                ALKPHOS                  109                 08/04/2019                BILITOT  0.6                  08/04/2019              Endo/Other  diabetes, Type 2, Oral Hypoglycemic Agents    Renal/GU Renal diseasenegative Renal ROSLab Results      Component                Value               Date                      CREATININE               0.80                04/21/2020                Musculoskeletal  (+) Arthritis ,    Abdominal   Peds  Hematology  (+) Blood dyscrasia, anemia Lab Results      Component                Value               Date                      WBC                      7.6                 04/21/2020                HGB                      12.6                04/21/2020                HCT                      37.3                04/21/2020                MCV                      90.5                04/21/2020                PLT                      258                 04/21/2020              Anesthesia Other Findings   Reproductive/Obstetrics                             Anesthesia Physical Anesthesia Plan  ASA: 3  Anesthesia Plan: General   Post-op Pain Management: Tylenol PO (pre-op)*   Induction: Intravenous  PONV Risk Score and Plan: 3 and Ondansetron, Dexamethasone, Midazolam, Diphenhydramine and Treatment may vary due to age or medical condition  Airway Management Planned: Oral ETT  Additional Equipment: None  Intra-op Plan:   Post-operative Plan: Extubation  in OR  Informed Consent: I have reviewed the patients History and Physical, chart, labs and discussed the procedure including the risks, benefits and alternatives for the proposed anesthesia with the patient or authorized representative who has indicated his/her understanding and acceptance.     Dental advisory given  Plan Discussed with: CRNA  Anesthesia Plan Comments: (Surgeon requests no block)        Anesthesia Quick Evaluation

## 2022-05-09 ENCOUNTER — Encounter (HOSPITAL_COMMUNITY): Payer: Self-pay | Admitting: Orthopedic Surgery

## 2022-05-09 DIAGNOSIS — M00219 Other streptococcal arthritis, unspecified shoulder: Secondary | ICD-10-CM | POA: Diagnosis not present

## 2022-05-09 LAB — PATHOLOGIST SMEAR REVIEW

## 2022-05-09 LAB — GLUCOSE, CAPILLARY
Glucose-Capillary: 168 mg/dL — ABNORMAL HIGH (ref 70–99)
Glucose-Capillary: 189 mg/dL — ABNORMAL HIGH (ref 70–99)
Glucose-Capillary: 253 mg/dL — ABNORMAL HIGH (ref 70–99)
Glucose-Capillary: 186 mg/dL — ABNORMAL HIGH (ref 70–99)
Glucose-Capillary: 255 mg/dL — ABNORMAL HIGH (ref 70–99)

## 2022-05-09 MED ORDER — TRAZODONE HCL 50 MG PO TABS
50.0000 mg | ORAL_TABLET | Freq: Every evening | ORAL | Status: DC | PRN
  Administered 2022-05-10: 50 mg via ORAL
  Filled 2022-05-09: qty 1

## 2022-05-09 MED ORDER — SODIUM CHLORIDE 0.9 % IV SOLN
500.0000 mg | Freq: Every day | INTRAVENOUS | Status: DC
  Administered 2022-05-09: 500 mg via INTRAVENOUS
  Filled 2022-05-09 (×2): qty 10

## 2022-05-09 MED ORDER — IPRATROPIUM-ALBUTEROL 0.5-2.5 (3) MG/3ML IN SOLN: 3.0000 mL | RESPIRATORY_TRACT | Status: DC | PRN

## 2022-05-09 MED ORDER — GUAIFENESIN 100 MG/5ML PO LIQD: 5.0000 mL | ORAL | Status: DC | PRN

## 2022-05-09 MED ORDER — HYDRALAZINE HCL 20 MG/ML IJ SOLN: 10.0000 mg | INTRAMUSCULAR | Status: DC | PRN

## 2022-05-09 MED ORDER — METOPROLOL TARTRATE 5 MG/5ML IV SOLN: 5.0000 mg | INTRAVENOUS | Status: DC | PRN

## 2022-05-09 NOTE — Progress Notes (Signed)
Subjective: Patient reports pain as mild to moderate. L shoulder already feels better than before surgery. Tolerating diet.  Urinating.   No CP, SOB.  Hadn't worked with PT yet when I saw her this AM.  Objective:   VITALS:   Vitals:   05/09/22 0028 05/09/22 0440 05/09/22 0955 05/09/22 1015  BP: (!) 104/53 133/65 126/72 (!) 114/58  Pulse: (!) 57 (!) 57 71 70  Resp: '19 19 17 17  '$ Temp: 97.8 F (36.6 C) 97.6 F (36.4 C) 98.1 F (36.7 C) 98.4 F (36.9 C)  TempSrc: Oral Oral Oral Oral  SpO2: 100% 100% 100% 100%  Weight:      Height:          Latest Ref Rng & Units 05/08/2022    1:06 AM 05/07/2022    4:43 PM 04/23/2022   12:53 AM  CBC  WBC 4.0 - 10.5 K/uL 8.8  9.8  22.6   Hemoglobin 12.0 - 15.0 g/dL 8.1  9.0  11.9   Hematocrit 36.0 - 46.0 % 26.4  29.7  35.7   Platelets 150 - 400 K/uL 382  427  461       Latest Ref Rng & Units 05/08/2022    1:06 AM 05/07/2022    4:43 PM 04/23/2022   12:53 AM  BMP  Glucose 70 - 99 mg/dL 170  200  313   BUN 8 - 23 mg/dL '10  9  14   '$ Creatinine 0.44 - 1.00 mg/dL 0.84  0.88  0.84   Sodium 135 - 145 mmol/L 135  134  127   Potassium 3.5 - 5.1 mmol/L 3.6  3.6  3.6   Chloride 98 - 111 mmol/L 105  101  91   CO2 22 - 32 mmol/L '24  25  25   '$ Calcium 8.9 - 10.3 mg/dL 7.9  8.0  9.0    Intake/Output      03/12 0701 03/13 0700 03/13 0701 03/14 0700   P.O. 750 460   I.V. (mL/kg) 1000 (16.9)    IV Piggyback 255    Total Intake(mL/kg) 2005 (34) 460 (7.8)   Urine (mL/kg/hr) 1600 (1.1) 400 (1.3)   Blood 50    Total Output 1650 400   Net +355 +60           Physical Exam: General: NAD.  Sitting up in bed, calm, comfortable Resp: No increased wob Cardio: regular rate and rhythm ABD soft Neurologically intact MSK Neurovascularly intact Sensation intact distally Intact pulses distally Elbow and wrist flexion/extension intact L shoulder ROM decreased Incision: dressing C/D/I   Assessment: 1 Day Post-Op  S/P Procedure(s) (LRB): ARTHROSCOPY  SHOULDER (Left) ARTHROSCOPIC INCISION AND DRAINAGE ABSCESS, DISTAL CLAVICLE EXCISSION, SUBACROMIAL DECOMPRESSION, LOOSE BODY REMOVAL, EXTENSIVE DEBRIDEMENT (Left) by Dr. Ernesta Amble. Percell Miller on 05/08/22  Principal Problem:   Septic arthritis of shoulder ([place]) Active Problems:   DM type 2 with diabetic peripheral neuropathy ([place])   Hyperlipidemia associated with type 2 diabetes mellitus ([place])   Essential hypertension   Carotid artery occlusion without infarction, right   Plan: Need to get ID consulted  Advance diet Up with therapy Incentive Spirometry Elevate and Apply ice  Weightbearing: WBAT LUE Insicional and dressing care: Dressings left intact until follow-up and Reinforce dressings as needed Orthopedic device(s):  sling for comfort Showering: Keep dressing dry VTE prophylaxis:  can restart home Plavix , SCDs, ambulation Pain control: continue current regimen Follow - up plan: 1 week after surgery Contact information:  [address] [address]  Percell Miller MD, Aggie Moats PA-C  Dispo:  TBD but should likely be able to return home. If ID feels she needs IV ABX then she may need HHRN to manage that.      Britt Bottom, PA-C Office [phone] 05/09/2022, 12:15 PM

## 2022-05-09 NOTE — Evaluation (Signed)
Physical Therapy Evaluation Patient Details Name: April Shaffer MRN: [id number] DOB: May 16, 1950 Today's Date: 05/09/2022  History of Present Illness  April Shaffer is a 72 y.o. female with medical history significant of DM-2, HTN, HLD, right carotid artery stenosis-, back surgery admitted 05/07/22  for left shoulder septic arthritis.S/P I and D 05/08/22.  Clinical Impression  Pt admitted with above diagnosis.  Pt currently with functional limitations due to the deficits listed below (see PT Problem List). Pt will benefit from skilled PT to increase their independence and safety with mobility to allow discharge to the venue listed below.   The patient  reports  no shoulder pain, able to ambulate with SPC on right side, gait steady, slow. No loss of balance noted. Patient  should progress to return home at cane level.        Recommendations for follow up therapy are one component of a multi-disciplinary discharge planning process, led by the attending physician.  Recommendations may be updated based on patient status, additional functional criteria and insurance authorization.  Follow Up Recommendations No PT follow up      Assistance Recommended at Discharge PRN  Patient can return home with the following  Help with stairs or ramp for entrance;Assistance with cooking/housework    Equipment Recommendations None recommended by PT  Recommendations for Other Services       Functional Status Assessment Patient has had a recent decline in their functional status and demonstrates the ability to make significant improvements in function in a reasonable and predictable amount of time.     Precautions / Restrictions Precautions Precautions: Fall Required Braces or Orthoses: Sling Restrictions Weight Bearing Restrictions: No LUE Weight Bearing: Weight bearing as tolerated Other Position/Activity Restrictions: sling PRN      Mobility  Bed Mobility Overal bed mobility: Needs  Assistance Bed Mobility: Supine to Sit     Supine to sit: Supervision     General bed mobility comments: extra time to right bed side    Transfers Overall transfer level: Needs assistance Equipment used: Straight cane Transfers: Sit to/from Stand Sit to Stand: Min guard                Ambulation/Gait Ambulation/Gait assistance: Min guard, Supervision Gait Distance (Feet): 100 Feet Assistive device: Straight cane Gait Pattern/deviations: Step-through pattern       General Gait Details: gait steady and slow  Stairs            Wheelchair Mobility    Modified Rankin (Stroke Patients Only)       Balance Overall balance assessment: No apparent balance deficits (not formally assessed)                                           Pertinent Vitals/Pain Pain Assessment Pain Assessment: No/denies pain    Home Living Family/patient expects to be discharged to:: Private residence Living Arrangements: Spouse/significant other Available Help at Discharge: Available 24 hours/day Type of Home: House Home Access: Stairs to enter Entrance Stairs-Rails: None Entrance Stairs-Number of Steps: 1   Home Layout: One level Home Equipment: Shower seat;Adaptive equipment      Prior Function Prior Level of Function : Independent/Modified Independent             Mobility Comments: uses a cane in the community       Hand Dominance   Dominant Hand: Right  Extremity/Trunk Assessment   Upper Extremity Assessment Upper Extremity Assessment: Defer to OT evaluation    Lower Extremity Assessment Lower Extremity Assessment: Overall WFL for tasks assessed    Cervical / Trunk Assessment Cervical / Trunk Assessment: Kyphotic  Communication   Communication: No difficulties  Cognition Arousal/Alertness: Awake/alert Behavior During Therapy: WFL for tasks assessed/performed Overall Cognitive Status: Within Functional Limits for tasks assessed                                           General Comments      Exercises     Assessment/Plan    PT Assessment Patient needs continued PT services  PT Problem List Decreased activity tolerance;Decreased mobility;Decreased knowledge of precautions       PT Treatment Interventions DME instruction;Therapeutic exercise;Gait training;Balance training;Functional mobility training;Therapeutic activities    PT Goals (Current goals can be found in the Care Plan section)  Acute Rehab PT Goals Patient Stated Goal: go home, be independnet PT Goal Formulation: With patient Time For Goal Achievement: 05/23/22 Potential to Achieve Goals: Good    Frequency Min 3X/week     Co-evaluation               AM-PAC PT "6 Clicks" Mobility  Outcome Measure Help needed turning from your back to your side while in a flat bed without using bedrails?: None Help needed moving from lying on your back to sitting on the side of a flat bed without using bedrails?: None Help needed moving to and from a bed to a chair (including a wheelchair)?: None Help needed standing up from a chair using your arms (e.g., wheelchair or bedside chair)?: None Help needed to walk in hospital room?: A Little Help needed climbing 3-5 steps with a railing? : A Little 6 Click Score: 22    End of Session Equipment Utilized During Treatment: Gait belt Activity Tolerance: Patient tolerated treatment well Patient left: in chair;with call bell/phone within reach;with chair alarm set Nurse Communication: Mobility status PT Visit Diagnosis: Unsteadiness on feet (R26.81)    Time: [id number] PT Time Calculation (min) (ACUTE ONLY): 17 min   Charges:   PT Evaluation $PT Eval Low Complexity: 1 Low          [place] Office [phone] Weekend [phone]   Claretha Cooper 05/09/2022, 11:47 AM

## 2022-05-09 NOTE — Anesthesia Postprocedure Evaluation (Signed)
Anesthesia Post Note  Patient: April Shaffer  Procedure(s) Performed: ARTHROSCOPY SHOULDER (Left: Shoulder) ARTHROSCOPIC INCISION AND DRAINAGE ABSCESS, DISTAL CLAVICLE EXCISSION, SUBACROMIAL DECOMPRESSION, LOOSE BODY REMOVAL, EXTENSIVE DEBRIDEMENT (Left)     Patient location during evaluation: PACU Anesthesia Type: General Level of consciousness: sedated and patient cooperative Pain management: pain level controlled Vital Signs Assessment: post-procedure vital signs reviewed and stable Respiratory status: spontaneous breathing Cardiovascular status: stable Anesthetic complications: no   No notable events documented.  Last Vitals:  Vitals:   05/09/22 0028 05/09/22 0440  BP: (!) 104/53 133/65  Pulse: (!) 57 (!) 57  Resp: 19 19  Temp: 36.6 C 36.4 C  SpO2: 100% 100%    Last Pain:  Vitals:   05/09/22 0900  TempSrc:   PainSc: 0-No pain                 Nolon Nations

## 2022-05-09 NOTE — Evaluation (Signed)
Occupational Therapy Evaluation Patient Details Name: April Shaffer MRN: [id number] DOB: 05/02/1950 Today's Date: 05/09/2022   History of Present Illness April Shaffer is a 72 y.o. female with medical history significant of DM-2, HTN, HLD, right carotid artery stenosis-, back surgery admitted 05/07/22  for left shoulder septic arthritis.S/P I and D 05/08/22.   Clinical Impression   Ms. April Shaffer is a 72 year old woman s/p I & D of her left shoulder and reports minimal pain today. Patient able to use LUE for ADLs though shoulder ROM is limited. She is overall min assist for ADLs and min guard to ambulate with cane. She has assistance of significant other at discharge. She is allowed to mobilize her shoulder  and therapist demonstrated some exercises. She is allowed to wear sling just for comfort. She needs a larger sling and it has been requested. From an OT standpoint she has no further OT needs.       Recommendations for follow up therapy are one component of a multi-disciplinary discharge planning process, led by the attending physician.  Recommendations may be updated based on patient status, additional functional criteria and insurance authorization.   Follow Up Recommendations  No OT follow up     Assistance Recommended at Discharge Intermittent Supervision/Assistance  Patient can return home with the following A little help with bathing/dressing/bathroom;Assistance with cooking/housework    Functional Status Assessment  Patient has had a recent decline in their functional status and demonstrates the ability to make significant improvements in function in a reasonable and predictable amount of time.  Equipment Recommendations  None recommended by OT    Recommendations for Other Services       Precautions / Restrictions Precautions Precautions: Fall Precaution Comments: Wear sling for comfort, okay to mobilize shoulder Required Braces or Orthoses:  Sling Restrictions Weight Bearing Restrictions: No LUE Weight Bearing: Weight bearing as tolerated Other Position/Activity Restrictions: sling PRN      Mobility Bed Mobility Overal bed mobility: Needs Assistance Bed Mobility: Supine to Sit           General bed mobility comments: extra time to right bed side    Transfers Overall transfer level: Needs assistance Equipment used: Straight cane Transfers: Sit to/from Stand Sit to Stand: Min guard           General transfer comment: min guard with cane      Balance Overall balance assessment: Mild deficits observed, not formally tested                                         ADL either performed or assessed with clinical judgement   ADL Overall ADL's : Needs assistance/impaired Eating/Feeding: Set up   Grooming: Modified independent   Upper Body Bathing: Minimal assistance;Sitting   Lower Body Bathing: Sit to/from stand;Modified independent   Upper Body Dressing : Moderate assistance;Sitting   Lower Body Dressing: Minimal assistance;Sit to/from stand   Toilet Transfer: Modified Dentist and Hygiene: Modified independent               Vision Patient Visual Report: No change from baseline       Perception     Praxis      Pertinent Vitals/Pain Pain Assessment Pain Assessment: No/denies pain     Hand Dominance Right   Extremity/Trunk Assessment Upper Extremity Assessment Upper Extremity Assessment: RUE deficits/detail;LUE  deficits/detail RUE Deficits / Details: Grossly able to abduct and flex 45 degrees otherwise functional ROM RUE Sensation: WNL RUE Coordination: WNL LUE Deficits / Details: WFL ROM and strength LUE Sensation: WNL LUE Coordination: WNL   Lower Extremity Assessment Lower Extremity Assessment: Overall WFL for tasks assessed   Cervical / Trunk Assessment Cervical / Trunk Assessment: Kyphotic   Communication  Communication Communication: No difficulties   Cognition Arousal/Alertness: Awake/alert Behavior During Therapy: WFL for tasks assessed/performed Overall Cognitive Status: Within Functional Limits for tasks assessed                                       General Comments       Exercises     Shoulder Instructions      Home Living Family/patient expects to be discharged to:: Private residence Living Arrangements: Spouse/significant other Available Help at Discharge: Available 24 hours/day Type of Home: House Home Access: Stairs to enter [employer] of Steps: 1 Entrance Stairs-Rails: None Home Layout: One level     Bathroom Shower/Tub: Tub/shower unit         Home Equipment: Shower seat;Adaptive equipment Adaptive Equipment: Sock aid        Prior Functioning/Environment Prior Level of Function : Independent/Modified Independent             Mobility Comments: uses a cane in the community          OT Problem List: Decreased strength;Decreased range of motion;Pain      OT Treatment/Interventions:      OT Goals(Current goals can be found in the care plan section) Acute Rehab OT Goals OT Goal Formulation: All assessment and education complete, DC therapy  OT Frequency:      Co-evaluation              AM-PAC OT "6 Clicks" Daily Activity     Outcome Measure Help from another person eating meals?: A Little Help from another person taking care of personal grooming?: None Help from another person toileting, which includes using toliet, bedpan, or urinal?: None Help from another person bathing (including washing, rinsing, drying)?: A Little Help from another person to put on and taking off regular upper body clothing?: A Lot Help from another person to put on and taking off regular lower body clothing?: A Little 6 Click Score: 19   End of Session Nurse Communication: Mobility status  Activity Tolerance: Patient tolerated  treatment well Patient left: in chair;with call bell/phone within reach;with chair alarm set  OT Visit Diagnosis: Pain                Time: [id number] OT Time Calculation (min): 17 min Charges:  OT General Charges $OT Visit: 1 Visit OT Evaluation $OT Eval Low Complexity: 1 Low  Gustavo Lah, OTR/L [place]  Office [phone]   Lenward Chancellor 05/09/2022, 1:53 PM

## 2022-05-09 NOTE — Progress Notes (Signed)
Orthopedic Tech Progress Note Patient Details:  April Shaffer 08/05/1950 [id number]  Patient ID: [name], female   DOB: 1951-02-26, 72 y.o.   MRN: [id number]  [name] 05/09/2022, 11:01 AM Left shoulder immobilizer applied

## 2022-05-09 NOTE — Progress Notes (Signed)
PROGRESS NOTE    April Shaffer  [id number] DOB: 1950-08-01 DOA: 05/07/2022 PCP: Sonia Side., FNP   Brief Narrative:   72 y.o. female with medical history significant of DM-2, HTN, HLD, right carotid artery stenosis-who was referred from orthopedic office for left shoulder septic arthritis.  She was seen outpatient orthopedic due to worsening of pain, Gram stain was positive for gram Stable cocci concerning for septic arthritis and admitted to the hospital for further management.  Orthopedic team was consulted.  Assessment & Plan:  Principal Problem:   Septic arthritis of shoulder ([place]) Active Problems:   DM type 2 with diabetic peripheral neuropathy ([place])   Hyperlipidemia associated with type 2 diabetes mellitus ([place])   Essential hypertension   Carotid artery occlusion without infarction, right   Septic arthritis involving left shoulder s/p arthrocentesis 3/9 at orthopedic office (Gram stain positive for gram-positive cocci in pairs/chains) Ongoing mild right shoulder pain Ongoing right hip pain (prosthesis in place) Overall nontoxic-appearing.  MRI showing septic arthritis with large effusion.  Orthopedic following, underwent arthroscopy with debridement and loose body removal on 3/12.  Status post right hip aspiration by IR 3/12 Abx = IV Ancef ID consulted  DM-2, insulin-dependent Peripheral neuropathy Sliding scale and Accu-Cheks. Gabapentin   HTN Continue amlodipine/Coreg.  IV as needed   HLD Lipitor, Zetia   History of right carotid endarterectomy History of left carotid artery stenosis At home patient is on Plavix and statin   History of B12 deficiency Anemia due to chronic disease Follows with Dr. [facility] -hold B12 as >1000 on lab   DVT prophylaxis: SCDs Start: 05/08/22 1705 Code Status: Full Code Family Communication:    Status is: Inpatient Cont hosp stay for IV Abx, concerns of septic arthritis      Subjective:  Feels ok no  complaints.   Examination:  General exam: Appears calm and comfortable  Respiratory system: Clear to auscultation. Respiratory effort normal. Cardiovascular system: S1 & S2 heard, RRR. No JVD, murmurs, rubs, gallops or clicks. No pedal edema. Gastrointestinal system: Abdomen is nondistended, soft and nontender. No organomegaly or masses felt. Normal bowel sounds heard. Central nervous system: Alert and oriented. No focal neurological deficits. Extremities: Symmetric 5 x 5 power. LUE sling in place.  Skin: No rashes, lesions or ulcers Psychiatry: Judgement and insight appear normal. Mood & affect appropriate.     Objective: Vitals:   05/08/22 1657 05/08/22 2015 05/09/22 0028 05/09/22 0440  BP: (!) 146/70 119/63 (!) 104/53 133/65  Pulse: 69 72 (!) 57 (!) 57  Resp: '16 19 19 19  '$ Temp: 97.8 F (36.6 C) 97.6 F (36.4 C) 97.8 F (36.6 C) 97.6 F (36.4 C)  TempSrc: Oral Oral Oral Oral  SpO2: 100% 97% 100% 100%  Weight:      Height:        Intake/Output Summary (Last 24 hours) at 05/09/2022 0857 Last data filed at 05/09/2022 0600 Gross per 24 hour  Intake 2005 ml  Output 1650 ml  Net 355 ml   Filed Weights   05/07/22 1602 05/08/22 1209  Weight: 59 kg 59 kg     Data Reviewed:   CBC: Recent Labs  Lab 05/07/22 1643 05/08/22 0106  WBC 9.8 8.8  NEUTROABS 6.8  --   HGB 9.0* 8.1*  HCT 29.7* 26.4*  MCV 85.8 85.2  PLT 427* [id number]   Basic Metabolic Panel: Recent Labs  Lab 05/07/22 1643 05/08/22 0106  NA 134* 135  K 3.6 3.6  CL  101 105  CO2 25 24  GLUCOSE 200* 170*  BUN 9 10  CREATININE 0.88 0.84  CALCIUM 8.0* 7.9*   GFR: Estimated Creatinine Clearance: 52.3 mL/min (by C-G formula based on SCr of 0.84 mg/dL). Liver Function Tests: Recent Labs  Lab 05/07/22 1643  AST 16  ALT 13  ALKPHOS 123  BILITOT 0.4  PROT 8.5*  ALBUMIN 2.3*   No results for input(s): "LIPASE", "AMYLASE" in the last 168 hours. No results for input(s): "AMMONIA" in the last 168  hours. Coagulation Profile: No results for input(s): "INR", "PROTIME" in the last 168 hours. Cardiac Enzymes: No results for input(s): "CKTOTAL", "CKMB", "CKMBINDEX", "TROPONINI" in the last 168 hours. BNP (last 3 results) No results for input(s): "PROBNP" in the last 8760 hours. HbA1C: No results for input(s): "HGBA1C" in the last 72 hours. CBG: Recent Labs  Lab 05/08/22 1715 05/08/22 2012 05/08/22 2357 05/09/22 0437 05/09/22 0819  GLUCAP 194* 276* 210* 168* 189*   Lipid Profile: No results for input(s): "CHOL", "HDL", "LDLCALC", "TRIG", "CHOLHDL", "LDLDIRECT" in the last 72 hours. Thyroid Function Tests: No results for input(s): "TSH", "T4TOTAL", "FREET4", "T3FREE", "THYROIDAB" in the last 72 hours. Anemia Panel: Recent Labs    05/07/22 1647  VITAMINB12 1,253*  FOLATE 5.8*  FERRITIN 1,046*  TIBC 155*  IRON 21*  RETICCTPCT 0.8   Sepsis Labs: Recent Labs  Lab 05/07/22 1631 05/07/22 1945  LATICACIDVEN 3.0* 1.8    Recent Results (from the past 240 hour(s))  Culture, blood (routine x 2)     Status: None (Preliminary result)   Collection Time: 05/07/22  4:43 PM   Specimen: BLOOD  Result Value Ref Range Status   Specimen Description   Final    BLOOD RIGHT ANTECUBITAL Performed at [facility], [place] [address], [place], [place] [address]    Special Requests   Final    BOTTLES DRAWN AEROBIC AND ANAEROBIC Blood Culture adequate volume Performed at [facility] [address], [place], [place] [address]    Culture   Final    NO GROWTH 2 DAYS Performed at [facility] [address], [place], [place] [address]    Report Status PENDING  Incomplete  Culture, blood (routine x 2)     Status: None (Preliminary result)   Collection Time: 05/07/22  5:01 PM   Specimen: BLOOD  Result Value Ref Range Status   Specimen Description   Final    BLOOD LEFT ANTECUBITAL Performed at [facility], [place] [address] [place], [place] [address]     Special Requests   Final    BOTTLES DRAWN AEROBIC AND ANAEROBIC Blood Culture adequate volume Performed at [facility] [address], [place], [place] [address]    Culture   Final    NO GROWTH 2 DAYS Performed at [facility] [address] [place],  [address]    Report Status PENDING  Incomplete  Surgical pcr screen     Status: None   Collection Time: 05/08/22  8:51 AM   Specimen: Nasal Mucosa; Nasal Swab  Result Value Ref Range Status   MRSA, PCR NEGATIVE NEGATIVE Final   Staphylococcus aureus NEGATIVE NEGATIVE Final    Comment: (NOTE) The Xpert SA Assay (FDA approved for NASAL specimens in patients 36 years of age and older), is one component of a comprehensive surveillance program. It is not intended to diagnose infection nor to guide or monitor treatment. Performed at [facility], [place] [address], [place], [place] [address]   Aerobic/Anaerobic Culture w Gram Stain (surgical/deep wound)     Status: None (Preliminary result)   Collection Time: 05/08/22 10:56 AM   Specimen: Joint, Right Hip; Synovial Fluid  Result Value Ref Range Status   Specimen Description   Final    SYNOVIAL Performed at [facility] [address], [place], [place] [address]    Special Requests   Final    RIGHT HIP Performed at [facility] [address], [place], [place] [address]    Gram Stain NO WBC SEEN NO ORGANISMS SEEN   Final   Culture   Final    NO GROWTH < 24 HOURS Performed at [facility], [place] [address], [place], [place] [address]    Report Status PENDING  Incomplete  Aerobic/Anaerobic Culture w Gram Stain (surgical/deep wound)     Status: None (Preliminary result)   Collection Time: 05/08/22  1:50 PM   Specimen: Joint, Other; Body Fluid  Result Value Ref Range Status   Specimen Description   Final    FLUID Performed at [facility] [address],  [place], [place] [address]    Special Requests   Final    JOINT Performed at [facility], [place] [address], [place], [place] [address]    Gram Stain   Final    ABUNDANT WBC PRESENT, PREDOMINANTLY PMN NO ORGANISMS SEEN    Culture   Final    NO GROWTH < 12 HOURS Performed at [facility] [address], [place], [place] [address]    Report Status PENDING  Incomplete         Radiology Studies: DG FLUORO GUIDED NEEDLE PLC ASPIRATION/INJECTION LOC  Result Date: 05/08/2022 INDICATION: 73 year old female status post right total hip replacement in 2020 presents with septic arthritis of shoulder and right hip pain. Request for fluoro guided right hip aspiration. EXAM: ARTHROCENTESIS OF RIGHT HIP JOINT UNDER FLUOROSCOPIC GUIDANCE COMPARISON:  CT 04/23/2022. FLUOROSCOPY TIME:  1.5 mGy COMPLICATIONS: None immediate. PROCEDURE: Informed written consent was obtained from the patient after discussion of the risks, benefits and alternatives to treatment. The patient was placed supine on the fluoroscopy table and the right extremity was placed in a slight degree of internal rotation. The right hip was localized with fluoroscopy. The skin overlying the anterior aspect of the hip was prepped and draped in usual sterile fashion. A 3.5 inch 18 gauge spinal needle was advanced into the hip joint at the lateral aspect of the femoral head-neck junction, after the overlying soft tissues were anesthetized with 1% lidocaine. Several attempts were made to aspirate the right hip joint but unsuccessful. Dr. Chancy Milroy was called to the procedure room for assistance. Approximately 1.5 mL of synovial fluid was aspirated from the right hip joint insert into the laboratory for analysis. A fluoroscopic image was saved and sent to PACs. The needle was removed and a dressing was placed. The patient tolerated procedure well without immediate postprocedural complication. IMPRESSION: Successful fluoroscopic guided  joint aspiration of the right hip. This procedure was performed by Durenda Guthrie, PA-C under the supervision of Maurine Simmering, MD. Electronically Signed   By: Maurine Simmering M.D.   On: 05/08/2022 11:18   MR Shoulder Right W Wo Contrast  Result Date: 05/07/2022 CLINICAL DATA:  Right shoulder pain EXAM: MRI OF THE RIGHT SHOULDER WITHOUT AND WITH CONTRAST TECHNIQUE: Multiplanar, multisequence MR imaging of the radiograph dated April 10, 2018 for shoulder was performed before and after the administration of intravenous contrast. CONTRAST:  65m GADAVIST GADOBUTROL 1 MMOL/ML IV SOLN COMPARISON:  None Available. FINDINGS: Rotator cuff: Supraspinatus tendinosis with low-grade partial-thickness (less than 50%) insertional tear at the footprint. Infraspinatus tendon is intact. Teres minor tendon is intact. Subscapularis tendon is intact. Muscles: No muscle atrophy or edema. No intramuscular fluid collection or hematoma. Biceps Long Head: Intraarticular and extraarticular portions of the biceps tendon are intact. Acromioclavicular Joint: Mild arthropathy of the acromioclavicular joint. No subacromial/subdeltoid bursal fluid. Glenohumeral Joint: No joint effusion. Articular cartilage thinning. No full-thickness cartilage defect. Labrum: Degenerative changes of the labrum with a chronic degenerative tear of the anteroinferior labrum. Bones: No fracture or dislocation. No aggressive osseous lesion. Other: No fluid collection or hematoma. IMPRESSION: 1. Supraspinatus tendinosis with low-grade partial-thickness (less than 50%) insertional tear at the footprint. 2. Degenerative changes of the labrum with a chronic degenerative tear of the anteroinferior labrum. 3. Mild acromioclavicular osteoarthritis . 4. No evidence of fracture or dislocation. 5. No joint effusion or evidence of septic arthritis . Electronically Signed   By: IKeane PoliceD.O.   On: 05/07/2022 23:27   MR SHOULDER LEFT W WO CONTRAST  Result Date:  05/07/2022 CLINICAL DATA:  Bilateral shoulder pain, the left shoulder possibly septic. EXAM: MRI OF THE LEFT SHOULDER WITHOUT AND WITH CONTRAST TECHNIQUE: Multiplanar, multisequence MR imaging of the shoulder was performed before and after the administration of intravenous contrast. CONTRAST:  648mGADAVIST GADOBUTROL 1 MMOL/ML IV SOLN COMPARISON:  Radiograph performed earlier on the same date. FINDINGS: Rotator cuff: Supraspinatus tendinosis with full-thickness partial width tear measuring a proximally 9 mm in AP dimension. Infraspinatus tendinosis with high-grade partial-thickness tear at the footprint. Teres minor tendon is intact. Subscapularis tendon is intact. Muscles: There is intramuscular edema with enhancement on the postcontrast sequences highly concerning for infectious/inflammatory process Biceps Long Head: Intraarticular and extraarticular portions of the biceps tendon are intact. Acromioclavicular Joint: Severe arthropathy of the acromioclavicular joint with joint effusion. There is fluid about the superior aspect of the acromioclavicular joint. Glenohumeral Joint: Large shoulder joint effusion with synovial enhancement on post-contrast sequences highly suspicious for septic arthritis. Advanced articular cartilage thinning. No full-thickness cartilage defect Labrum: Degenerative changes of the labrum without evidence of acute tear. Bones: No evidence of osteomyelitis. Bulky glenohumeral and acromioclavicular marginal osteophytes. No fracture or dislocation Other: Small fluid collection about the superior aspect of the acromioclavicular joint. IMPRESSION: 1. Large left shoulder joint effusion with synovial enhancement on post-contrast sequences highly suspicious for septic arthritis. Orthopedic consultation and joint aspiration for further management is suggested. 2. Severe acromioclavicular arthropathy with joint effusion and fluid about the superior aspect of the joint concerning for septic arthritis.  3. Edema of the supraspinatus, infraspinatus and subscapularis muscles, likely reactive secondary to infectious/inflammatory process. 4. No evidence of osteomyelitis. 5. Supraspinatus and infraspinatus tendinosis with full-thickness partial width tear of the supraspinatus and high-grade partial-thickness tear of the infraspinatus. 6. Advanced glenohumeral and acromioclavicular osteoarthritis. Electronically Signed   By: ImKeane Police.O.   On: 05/07/2022 23:14   DG Shoulder Left  Result Date: 05/07/2022 CLINICAL DATA:  Pain EXAM: LEFT SHOULDER - 2+ VIEW COMPARISON:  None Available. FINDINGS: There is no evidence of fracture or dislocation. There is glenohumeral and acromioclavicular joint space narrowing and osteophyte formation compatible with degenerative change. Soft tissues are unremarkable. IMPRESSION: 1. No acute fracture or dislocation. 2. Degenerative changes in the glenohumeral and acromioclavicular joints. Electronically Signed  By: Ronney Asters M.D.   On: 05/07/2022 17:21        Scheduled Meds:  acetaminophen  1,000 mg Oral Q6H   amLODipine  10 mg Oral q AM   atorvastatin  80 mg Oral Daily   carvedilol  20 mg Oral Daily   clopidogrel  75 mg Oral Daily   docusate sodium  100 mg Oral BID   ezetimibe  10 mg Oral Daily   ferrous sulfate  325 mg Oral Q breakfast   gabapentin  300 mg Oral TID   insulin aspart  0-9 Units Subcutaneous Q4H   pantoprazole  40 mg Oral Daily   sodium chloride flush  3 mL Intravenous Q12H   Continuous Infusions:  sodium chloride      ceFAZolin (ANCEF) IV 1 g (05/09/22 0848)   methocarbamol (ROBAXIN) IV Stopped (05/08/22 1705)     LOS: 2 days   Time spent= 35 mins    Jessi Pitstick Arsenio Loader, MD [facility]  If 7PM-7AM, please contact night-coverage  05/09/2022, 8:57 AM

## 2022-05-09 NOTE — Plan of Care (Signed)
  Problem: Education: Goal: Knowledge of General Education information will improve Description: Including pain rating scale, medication(s)/side effects and non-pharmacologic comfort measures Outcome: Progressing   Problem: Clinical Measurements: Goal: Ability to maintain clinical measurements within normal limits will improve Outcome: Progressing Goal: Will remain free from infection Outcome: Progressing Goal: Diagnostic test results will improve Outcome: Progressing Goal: Respiratory complications will improve Outcome: Progressing Goal: Cardiovascular complication will be avoided Outcome: Progressing   Problem: Activity: Goal: Risk for activity intolerance will decrease Outcome: Progressing   Problem: Nutrition: Goal: Adequate nutrition will be maintained Outcome: Progressing   Problem: Coping: Goal: Level of anxiety will decrease Outcome: Progressing   Problem: Elimination: Goal: Will not experience complications related to bowel motility Outcome: Progressing Goal: Will not experience complications related to urinary retention Outcome: Progressing   Problem: Pain Managment: Goal: General experience of comfort will improve Outcome: Progressing   Problem: Safety: Goal: Ability to remain free from injury will improve Outcome: Progressing   Problem: Skin Integrity: Goal: Risk for impaired skin integrity will decrease Outcome: Progressing   Problem: Clinical Measurements: Goal: Ability to maintain clinical measurements within normal limits will improve Outcome: Progressing Goal: Postoperative complications will be avoided or minimized Outcome: Progressing   Problem: Skin Integrity: Goal: Demonstration of wound healing without infection will improve Outcome: Progressing

## 2022-05-09 NOTE — Consult Note (Incomplete)
[facility]                                                                                        Patient Identification: Patient Name: April Shaffer MRN: [id number] [place] Date: 05/07/2022  3:58 PM Today's Date: 05/09/2022 Reason for consult: septic arthritis  Requesting provider: Dr Reesa Chew  Principal Problem:   Septic arthritis of shoulder [facility]) Active Problems:   DM type 2 with diabetic peripheral neuropathy ([place])   Hyperlipidemia associated with type 2 diabetes mellitus ([place])   Essential hypertension   Carotid artery occlusion without infarction, right   Antibiotics:  Cefazolin 3/12-c Daptomycin 3/13-c  Lines/Hardware:  Assessment 72 Y O female with PMH as below including history of deep lumbar wound infection following L1-5 instrumented fusion in February 2022 treated with daptomycin/Vancomycin and rifampin followed by repeat OR 11/30/20 with replacement of Lumbar screws and placement of sacral screws for pseudoarthrosis and loosening of hardware with one of the removed screws with a small pocket of pus at the site of the screw which was debrided and cx with MRSA s/p prolonged course of bactrim, followed by Dr Linus Salmons admitted with   Left shoulder septic arthritis, native joint 3/8 Left shoulder synovial fluid cx MRSA ( media) 3/12 s/p left shoulder arthroscopy, I and D, distal clavicle excision, subacromial decompression and loose body removal   Recommendations  Continue daptomycin, plan for 6 weeks from 3/12  PICC ordered  ID pharmacy to place OPAT  Monitor CPK twice weekly, noted statin is adjusted to atorvastatin 40 mg po daily per [facility] Post op care per Ortho  Fu OR cx to completion, otherwise ID will so.  D/w ID Pharm D  Diagnosis: native left shoulder septic arthritis   Culture Result: MRSA  Allergies  Allergen Reactions   Zestoretic [Lisinopril-Hydrochlorothiazide]  Swelling    Angioedema  - Tongue swelling    Codeine Itching   Oxycodone Itching    Patient is taking at this time   Ultram [Tramadol] Itching   Zocor [Simvastatin] Other (See Comments)    Muscle pain    OPAT Orders Discharge antibiotics to be given via PICC line Discharge antibiotics: daptomycin 450 mg iv daily Duration: 6 weeks  End Date: 06/19/22  [facility] Care Per Protocol:  Home health RN for IV administration and teaching; PICC line care and labs.    Labs weekly while on IV antibiotics: X__ CBC with differential X__ BMP __ CMP __ CRP __ ESR __ Vancomycin trough X__ CK twice weekly   __ Please pull PIC at completion of IV antibiotics __ Please leave PIC in place until doctor has seen patient or been notified  Fax weekly labs to [phone]  Clinic Follow Up Appt:  3 weeks   Rest of the management as per the primary team. Please call with questions or concerns.  Thank you for the consult  Rosiland Oz, MD Infectious Disease Physician [facility] [address] [place], [place] [address] Phone: [phone]  Fax: [phone]  __________________________________________________________________________________________________________ HPI and Hospital Course: 68 Y O female with PMH as below  including history of deep lumbar wound infection following L1-5 instrumented fusion in February 2022 treated with daptomycin/Vancomycin and rifampin followed by repeat OR 11/30/20 with replacement of Lumbar screws and placement of sacral screws for pseudoarthrosis and loosening of hardware with one of the removed screws with a small pocket of pus at the site of the screw which was debrided and cx with MRSA s/p prolonged course of bactrim, followed by Dr  Linus Salmons who presented to ED per Ortho recs for infected left shoulder. She had fluid aspirated at ortho office Friday and was suggestive of infection, GPC in chains and clusters.  Denies fevers but had chills. She also has chronic pain in her rt shoulder and rt prosthetic hip and also had injection in the rt shoulder. She has not taken bactrim for a long time and reports completing course.   3/12 s/p left shoulder arthroscopy, I and D, distal clavicle excision, subacromial decompression and loose body removal  Imaging as below  ROS: General- Denies fever,  loss of appetite and loss of weight, chills + HEENT - Denies headache, blurry vision, neck pain, sinus pain Chest - Denies any chest pain, SOB or cough CVS- Denies any dizziness/lightheadedness, syncopal attacks, palpitations Abdomen- Denies any nausea, vomiting, abdominal pain, hematochezia and diarrhea Neuro - Denies any weakness, numbness, tingling sensation Psych - Denies any changes in mood irritability or depressive symptoms GU- Denies any burning, dysuria, hematuria or increased frequency of urination Skin - denies any rashes/lesions MSK - chronic pain in the rt shoulder and rt hip. Soreness in the left shoulder  Past Medical History:  Diagnosis Date   Anemia    Arthritis    Blood transfusion without reported diagnosis    Carotid artery narrowing    Coronary artery disease    Cardiac catheterization November 2013: 50% ostial LAD stenosis 50% mid stenosis. 30% disease in the left circumflex.   Diabetes mellitus, type 2 ([place])    Hyperlipidemia    Hypertension    Onychomycosis of toenail 07/31/2016   PAD (peripheral artery disease) ([facility])    Sleep apnea       Had surgery to correct   Thyroid nodule    Past Surgical History:  Procedure Laterality Date   ABDOMINAL AORTOGRAM W/LOWER EXTREMITY N/A 08/07/2021   Procedure: ABDOMINAL AORTOGRAM W/LOWER EXTREMITY;  Surgeon: Waynetta Sandy, MD;  Location: [facility];  Service: Cardiovascular;  Laterality: N/A;   ABDOMINAL AORTOGRAM W/LOWER EXTREMITY Right 10/23/2021   Procedure: ABDOMINAL AORTOGRAM W/LOWER EXTREMITY;  Surgeon: Waynetta Sandy, MD;  Location: [facility];  Service: Cardiovascular;  Laterality: Right;   ABDOMINAL HYSTERECTOMY     APPLICATION OF INTRAOPERATIVE CT SCAN N/A 11/30/2020   Procedure: APPLICATION OF INTRAOPERATIVE CT SCAN;  Surgeon: Eustace Moore, MD;  Location: [facility];  Service: Neurosurgery;  Laterality: N/A;   BACK SURGERY     CARDIAC CATHETERIZATION     CATARACT EXTRACTION, BILATERAL Bilateral 2021   Dr. Sherral Hammers   ENDARTERECTOMY  09/27/2011   Procedure: RIGT ENDARTERECTOMY CAROTID;  Surgeon: Mal Misty, MD;  Location: [facility];  Service: Vascular;  Laterality: Right;   EPIDURAL BLOCK INJECTION  02/2008   Drs. Eulis Manly   gyn surgery  2004   total hysterectomy for mennorhagia,,salpingoophorectomy   INCISION AND DRAINAGE ABSCESS Left 05/08/2022   Procedure: ARTHROSCOPIC INCISION AND DRAINAGE ABSCESS, DISTAL CLAVICLE EXCISSION, SUBACROMIAL DECOMPRESSION, LOOSE BODY REMOVAL, EXTENSIVE DEBRIDEMENT;  Surgeon: [name], MD;  Location: [facility];  Service: Orthopedics;  Laterality: Left;   LAMINECTOMY WITH POSTERIOR LATERAL ARTHRODESIS LEVEL 2 N/A 11/30/2020   Procedure: LUMBAR FOUR-FIVE, LUMBAR FIVE-SACRAL ONE POSTERIOR LATERAL FUSION WITH REVISION OF LUMBAR ONE-FIVE HARDWARE AND EXTENSION TO SACRAL ONE AND SACRAL TWO;  Surgeon: Eustace Moore, MD;  Location: [facility];  Service: Neurosurgery;  Laterality: N/A;   LUMBAR WOUND DEBRIDEMENT N/A 05/25/2020   Procedure: LUMBAR WOUND IRRIGATION AND DEBRIDEMENT;  Surgeon: Eustace Moore, MD;  Location: [facility];  Service: Neurosurgery;  Laterality: N/A;   PERIPHERAL VASCULAR INTERVENTION  08/07/2021   Procedure: PERIPHERAL VASCULAR INTERVENTION;  Surgeon: Waynetta Sandy, MD;  Location: [facility];  Service: Cardiovascular;;  Rt SFA   PERIPHERAL VASCULAR INTERVENTION Right 10/23/2021   Procedure: PERIPHERAL VASCULAR INTERVENTION;  Surgeon: Waynetta Sandy, MD;  Location: [facility];  Service: Cardiovascular;   Laterality: Right;  SFA   POSTERIOR LUMBAR FUSION 4 LEVEL N/A 04/25/2020   Procedure: POSTERIOR LUMBAR INTERBODY FUSION LUMBAR ONE-TWO, LUMBAR TWO-THREE, LUMBAR THREE-FOUR,LUMBAR FOUR-FIVE.;  Surgeon: Eustace Moore, MD;  Location: [facility];  Service: Neurosurgery;  Laterality: N/A;  posterior   SHOULDER ARTHROSCOPY Left 05/08/2022   Procedure: ARTHROSCOPY SHOULDER;  Surgeon: [name], MD;  Location: [facility];  Service: Orthopedics;  Laterality: Left;   TONSILLECTOMY     TOTAL ABDOMINAL HYSTERECTOMY W/ BILATERAL SALPINGOOPHORECTOMY Bilateral 2000   TOTAL HIP ARTHROPLASTY Right 08/22/2018   Procedure: TOTAL HIP ARTHROPLASTY;  Surgeon: Earlie Server, MD;  [facility];  Service: Orthopedics;  Laterality: Right;   VARICOSE VEIN SURGERY  2008   stripping     Scheduled Meds:  acetaminophen  1,000 mg Oral Q6H   amLODipine  10 mg Oral q AM   atorvastatin  80 mg Oral Daily   carvedilol  20 mg Oral Daily   clopidogrel  75 mg Oral Daily   docusate sodium  100 mg Oral BID   ezetimibe  10 mg Oral Daily   ferrous sulfate  325 mg Oral Q breakfast   gabapentin  300 mg Oral TID   insulin aspart  0-9 Units Subcutaneous Q4H   pantoprazole  40 mg Oral Daily   sodium chloride flush  3 mL Intravenous Q12H   Continuous Infusions:  sodium chloride     methocarbamol (ROBAXIN) IV Stopped (05/08/22 1705)   PRN Meds:.sodium chloride, albuterol, bisacodyl, bisacodyl, diphenhydrAMINE, HYDROmorphone (DILAUDID) injection, methocarbamol **OR** methocarbamol (ROBAXIN) IV, metoCLOPramide **OR** metoCLOPramide (REGLAN) injection, ondansetron **OR** ondansetron (ZOFRAN) IV, oxyCODONE, oxyCODONE, polyethylene glycol, sodium chloride flush  Allergies  Allergen Reactions   Zestoretic [Lisinopril-Hydrochlorothiazide] Swelling    Angioedema  - Tongue swelling    Codeine Itching   Oxycodone Itching    Patient is taking at this time   Ultram [Tramadol] Itching   Zocor [Simvastatin] Other (See Comments)     Muscle pain   Social History   Socioeconomic History   Marital status: Significant Other    Spouse name: Not on file   Number of children: 2   Years of education: 2   Highest education level: Not on file  Occupational History   Occupation: housekeeping-retired; 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    Comment: [employer]-retired 07/23/2014  Tobacco Use   Smoking status: Every Day    Packs/day: 0.25    Years: 50.00    Total pack years: 12.50    Types: Cigarettes    Start date: 05/31/1968    Passive exposure: Never   Smokeless tobacco: Never   Tobacco comments:  Smoked x50 years, 1 pack would last 3 days. Currently smoking 4 cigarettes per day  Vaping Use   Vaping Use: Never used  Substance and Sexual Activity   Alcohol use: Yes    Alcohol/week: 1.0 standard drink of alcohol    Types: 1 Cans of beer per week    Comment: 1-2 drinks every other day   Drug use: No   Sexual activity: Not on file  Other Topics Concern   Not on file  Social History Narrative   Lives with long term boyfriend (30 years together)   Both sons live in [place] Strain: Low Risk  (08/06/2018)   Overall Financial Resource Strain (CARDIA)    Difficulty of Paying Living Expenses: Not hard at all  Food Insecurity: No Food Insecurity (05/08/2022)   Hunger Vital Sign    Worried About Running Out of Food in the Last Year: Never true    Ran Out of Food in the Last Year: Never true  Transportation Needs: No Transportation Needs (05/08/2022)   PRAPARE - Hydrologist (Medical): No    Lack of Transportation (Non-Medical): No  Physical Activity: Inactive (06/17/2017)   Exercise Vital Sign    Days of Exercise per Week: 0 days    Minutes of Exercise per Session: 0 min  Stress: No Stress Concern Present (06/17/2017)   [place]    Feeling of Stress : Not  at all  Social Connections: Unknown (08/06/2018)   Social Connection and Isolation Panel [NHANES]    Frequency of Communication with Friends and Family: Not on file    Frequency of Social Gatherings with Friends and Family: Not on file    Attends Religious Services: Not on file    Active Member of Clubs or Organizations: Not on file    Attends Archivist Meetings: Not on file    Marital Status: Living with partner  Intimate Partner Violence: Not At Risk (05/08/2022)   Humiliation, Afraid, Rape, and Kick questionnaire    Fear of Current or Ex-Partner: No    Emotionally Abused: No    Physically Abused: No    Sexually Abused: No   Family History  Problem Relation Age of Onset   Hypertension Sister    Hypothyroidism Sister    Cancer Maternal Grandmother        lung   Hypertension Son    Hypertension Sister    Diabetes Brother        lost toe in 2018 with new diagnosis of DM   Hypertension Son    Vitals BP (!) 114/58 (BP Location: Right Arm)   Pulse 70   Temp 98.4 F (36.9 C) (Oral)   Resp 17   Ht '5\' 3"'$  (1.6 m)   Wt 59 kg   SpO2 100%   BMI 23.03 kg/m   Physical Exam Constitutional:  elderly black female sitting up in the bed and appears comfortable. Not in acute distress     Comments:   Cardiovascular:     Rate and Rhythm: Normal rate and regular rhythm.     Heart sounds: s1s2  Pulmonary:     Effort: Pulmonary effort is normal on room air     Comments: Normal lung sounds   Abdominal:     Palpations: Abdomen is soft.     Tenderness: non distended and non tender   Musculoskeletal:  General: No signs of septic joint in the rt shoulder and rt Prosthetic hip. Left shoulder is wrapped in a surgical dressing C/D/I. Left hand able to squeeze  Skin:    Comments: No rashes   Neurological:     General: awake, alert and oriented, following commands   Psychiatric:        Mood and Affect: Mood normal.    Pertinent Microbiology Results for orders placed  or performed during the hospital encounter of 05/07/22  Culture, blood (routine x 2)     Status: None (Preliminary result)   Collection Time: 05/07/22  4:43 PM   Specimen: BLOOD  Result Value Ref Range Status   Specimen Description   Final    BLOOD RIGHT ANTECUBITAL Performed at [facility], [place] [address] [place], [place] [address]    Special Requests   Final    BOTTLES DRAWN AEROBIC AND ANAEROBIC Blood Culture adequate volume Performed at [facility] [address] [place], [place] [address]    Culture   Final    NO GROWTH 2 DAYS Performed at [facility] [address], [place], [place] [address]    Report Status PENDING  Incomplete  Culture, blood (routine x 2)     Status: None (Preliminary result)   Collection Time: 05/07/22  5:01 PM   Specimen: BLOOD  Result Value Ref Range Status   Specimen Description   Final    BLOOD LEFT ANTECUBITAL Performed at [facility], [place] [address], [place], [place] [address]    Special Requests   Final    BOTTLES DRAWN AEROBIC AND ANAEROBIC Blood Culture adequate volume Performed at [facility] [address] [place], [place] [address]    Culture   Final    NO GROWTH 2 DAYS Performed at [facility] [address], [place], [place] [address]    Report Status PENDING  Incomplete  Surgical pcr screen     Status: None   Collection Time: 05/08/22  8:51 AM   Specimen: Nasal Mucosa; Nasal Swab  Result Value Ref Range Status   MRSA, PCR NEGATIVE NEGATIVE Final   Staphylococcus aureus NEGATIVE NEGATIVE Final    Comment: (NOTE) The Xpert SA Assay (FDA approved for NASAL specimens in patients 12 years of age and older), is one component of a comprehensive surveillance program. It is not intended to diagnose infection nor to guide or monitor treatment. Performed at [facility], [place] [address], [place], [place] [address]   Aerobic/Anaerobic Culture w  Gram Stain (surgical/deep wound)     Status: None (Preliminary result)   Collection Time: 05/08/22 10:56 AM   Specimen: Joint, Right Hip; Synovial Fluid  Result Value Ref Range Status   Specimen Description   Final    SYNOVIAL Performed at [facility] [address], [place], [place] [address]    Special Requests   Final    RIGHT HIP Performed at [facility] [address] [place], [place] [address]    Gram Stain NO WBC SEEN NO ORGANISMS SEEN   Final   Culture   Final    NO GROWTH < 24 HOURS Performed at [facility], [place] [address] [place],  [address]    Report Status PENDING  Incomplete  Aerobic/Anaerobic Culture w Gram Stain (surgical/deep wound)     Status: None (Preliminary result)   Collection Time: 05/08/22  1:50 PM  Specimen: Joint, Other; Body Fluid  Result Value Ref Range Status   Specimen Description   Final    FLUID Performed at [facility] [address], [place], [place] [address]    Special Requests   Final    JOINT Performed at [facility], [place] [address] [place], [place] [address]    Gram Stain   Final    ABUNDANT WBC PRESENT, PREDOMINANTLY PMN NO ORGANISMS SEEN    Culture   Final    NO GROWTH < 12 HOURS Performed at [facility] [address], [place], [place] [address]    Report Status PENDING  Incomplete    Pertinent Lab seen by me:    Latest Ref Rng & Units 05/08/2022    1:06 AM 05/07/2022    4:43 PM 04/23/2022   12:53 AM  CBC  WBC 4.0 - 10.5 K/uL 8.8  9.8  22.6   Hemoglobin 12.0 - 15.0 g/dL 8.1  9.0  11.9   Hematocrit 36.0 - 46.0 % 26.4  29.7  35.7   Platelets 150 - 400 K/uL 382  427  461       Latest Ref Rng & Units 05/08/2022    1:06 AM 05/07/2022    4:43 PM 04/23/2022   12:53 AM  CMP  Glucose 70 - 99 mg/dL 170  200  313   BUN 8 - 23 mg/dL '10  9  14   '$ Creatinine 0.44 - 1.00 mg/dL 0.84  0.88  0.84   Sodium 135 - 145 mmol/L 135   134  127   Potassium 3.5 - 5.1 mmol/L 3.6  3.6  3.6   Chloride 98 - 111 mmol/L 105  101  91   CO2 22 - 32 mmol/L '24  25  25   '$ Calcium 8.9 - 10.3 mg/dL 7.9  8.0  9.0   Total Protein 6.5 - 8.1 g/dL  8.5    Total Bilirubin 0.3 - 1.2 mg/dL  0.4    Alkaline Phos 38 - 126 U/L  123    AST 15 - 41 U/L  16    ALT 0 - 44 U/L  13       Pertinent Imagings/Other Imagings Plain films and CT images have been personally visualized and interpreted; radiology reports have been reviewed. Decision making incorporated into the Impression / Recommendations.  DG FLUORO GUIDED NEEDLE PLC ASPIRATION/INJECTION LOC  Result Date: 05/08/2022 INDICATION: 72 year old female status post right total hip replacement in 2020 presents with septic arthritis of shoulder and right hip pain. Request for fluoro guided right hip aspiration. EXAM: ARTHROCENTESIS OF RIGHT HIP JOINT UNDER FLUOROSCOPIC GUIDANCE COMPARISON:  CT 04/23/2022. FLUOROSCOPY TIME:  1.5 mGy COMPLICATIONS: None immediate. PROCEDURE: Informed written consent was obtained from the patient after discussion of the risks, benefits and alternatives to treatment. The patient was placed supine on the fluoroscopy table and the right extremity was placed in a slight degree of internal rotation. The right hip was localized with fluoroscopy. The skin overlying the anterior aspect of the hip was prepped and draped in usual sterile fashion. A 3.5 inch 18 gauge spinal needle was advanced into the hip joint at the lateral aspect of the femoral head-neck junction, after the overlying soft tissues were anesthetized with 1% lidocaine. Several attempts were made to aspirate the right hip joint but unsuccessful. Dr. Chancy Milroy was called to the procedure room for assistance. Approximately 1.5 mL of synovial fluid was aspirated from the right hip  joint insert into the laboratory for analysis. A fluoroscopic image was saved and sent to PACs. The needle was removed and a dressing was placed. The  patient tolerated procedure well without immediate postprocedural complication. IMPRESSION: Successful fluoroscopic guided joint aspiration of the right hip. This procedure was performed by Durenda Guthrie, PA-C under the supervision of Maurine Simmering, MD. Electronically Signed   By: Maurine Simmering M.D.   On: 05/08/2022 11:18   MR Shoulder Right W Wo Contrast  Result Date: 05/07/2022 CLINICAL DATA:  Right shoulder pain EXAM: MRI OF THE RIGHT SHOULDER WITHOUT AND WITH CONTRAST TECHNIQUE: Multiplanar, multisequence MR imaging of the radiograph dated April 10, 2018 for shoulder was performed before and after the administration of intravenous contrast. CONTRAST:  36m GADAVIST GADOBUTROL 1 MMOL/ML IV SOLN COMPARISON:  None Available. FINDINGS: Rotator cuff: Supraspinatus tendinosis with low-grade partial-thickness (less than 50%) insertional tear at the footprint. Infraspinatus tendon is intact. Teres minor tendon is intact. Subscapularis tendon is intact. Muscles: No muscle atrophy or edema. No intramuscular fluid collection or hematoma. Biceps Long Head: Intraarticular and extraarticular portions of the biceps tendon are intact. Acromioclavicular Joint: Mild arthropathy of the acromioclavicular joint. No subacromial/subdeltoid bursal fluid. Glenohumeral Joint: No joint effusion. Articular cartilage thinning. No full-thickness cartilage defect. Labrum: Degenerative changes of the labrum with a chronic degenerative tear of the anteroinferior labrum. Bones: No fracture or dislocation. No aggressive osseous lesion. Other: No fluid collection or hematoma. IMPRESSION: 1. Supraspinatus tendinosis with low-grade partial-thickness (less than 50%) insertional tear at the footprint. 2. Degenerative changes of the labrum with a chronic degenerative tear of the anteroinferior labrum. 3. Mild acromioclavicular osteoarthritis . 4. No evidence of fracture or dislocation. 5. No joint effusion or evidence of septic arthritis . Electronically  Signed   By: IKeane PoliceD.O.   On: 05/07/2022 23:27   MR SHOULDER LEFT W WO CONTRAST  Result Date: 05/07/2022 CLINICAL DATA:  Bilateral shoulder pain, the left shoulder possibly septic. EXAM: MRI OF THE LEFT SHOULDER WITHOUT AND WITH CONTRAST TECHNIQUE: Multiplanar, multisequence MR imaging of the shoulder was performed before and after the administration of intravenous contrast. CONTRAST:  659mGADAVIST GADOBUTROL 1 MMOL/ML IV SOLN COMPARISON:  Radiograph performed earlier on the same date. FINDINGS: Rotator cuff: Supraspinatus tendinosis with full-thickness partial width tear measuring a proximally 9 mm in AP dimension. Infraspinatus tendinosis with high-grade partial-thickness tear at the footprint. Teres minor tendon is intact. Subscapularis tendon is intact. Muscles: There is intramuscular edema with enhancement on the postcontrast sequences highly concerning for infectious/inflammatory process Biceps Long Head: Intraarticular and extraarticular portions of the biceps tendon are intact. Acromioclavicular Joint: Severe arthropathy of the acromioclavicular joint with joint effusion. There is fluid about the superior aspect of the acromioclavicular joint. Glenohumeral Joint: Large shoulder joint effusion with synovial enhancement on post-contrast sequences highly suspicious for septic arthritis. Advanced articular cartilage thinning. No full-thickness cartilage defect Labrum: Degenerative changes of the labrum without evidence of acute tear. Bones: No evidence of osteomyelitis. Bulky glenohumeral and acromioclavicular marginal osteophytes. No fracture or dislocation Other: Small fluid collection about the superior aspect of the acromioclavicular joint. IMPRESSION: 1. Large left shoulder joint effusion with synovial enhancement on post-contrast sequences highly suspicious for septic arthritis. Orthopedic consultation and joint aspiration for further management is suggested. 2. Severe acromioclavicular  arthropathy with joint effusion and fluid about the superior aspect of the joint concerning for septic arthritis. 3. Edema of the supraspinatus, infraspinatus and subscapularis muscles, likely reactive secondary to infectious/inflammatory process. 4. No evidence  of osteomyelitis. 5. Supraspinatus and infraspinatus tendinosis with full-thickness partial width tear of the supraspinatus and high-grade partial-thickness tear of the infraspinatus. 6. Advanced glenohumeral and acromioclavicular osteoarthritis. Electronically Signed   By: Keane Police D.O.   On: 05/07/2022 23:14   DG Shoulder Left  Result Date: 05/07/2022 CLINICAL DATA:  Pain EXAM: LEFT SHOULDER - 2+ VIEW COMPARISON:  None Available. FINDINGS: There is no evidence of fracture or dislocation. There is glenohumeral and acromioclavicular joint space narrowing and osteophyte formation compatible with degenerative change. Soft tissues are unremarkable. IMPRESSION: 1. No acute fracture or dislocation. 2. Degenerative changes in the glenohumeral and acromioclavicular joints. Electronically Signed   By: Ronney Asters M.D.   On: 05/07/2022 17:21   NM Bone Scan 3 Phase  Result Date: 05/01/2022 CLINICAL DATA:  Right hip arthroplasty, right hip pain radiating down right leg EXAM: NUCLEAR MEDICINE 3-PHASE BONE SCAN TECHNIQUE: Radionuclide angiographic images, immediate static blood pool images, and 3-hour delayed static images were obtained of the bilateral hips after intravenous injection of radiopharmaceutical. RADIOPHARMACEUTICALS:  21.5 mCi Tc-33mMDP IV COMPARISON:  X-ray 04/19/2022 FINDINGS: Vascular phase: Normal symmetrical radiotracer distribution during the flow phase of the exam. Blood pool phase: Minimal excretion of radiotracer noted within the bladder. Photopenia related to right hip arthroplasty. No abnormal radiotracer deposition. Delayed phase: Minimal excretion of radiotracer within the bladder. Photopenia related to right hip arthroplasty.  Minimal low level radiotracer uptake is seen within the femoral diaphysis adjacent to the distal margin of the femoral component of the right hip arthroplasty, likely reactive. No pathologic radiotracer uptake is identified within either hip. IMPRESSION: 1. Postsurgical changes related to right hip arthroplasty. 2. No abnormal radiotracer uptake to suggest prosthesis loosening or infection. Electronically Signed   By: MRanda NgoM.D.   On: 05/01/2022 10:05   CT ABDOMEN PELVIS WO CONTRAST  Result Date: 04/23/2022 CLINICAL DATA:  72year old female with abdominal pain, nausea vomiting. EXAM: CT ABDOMEN AND PELVIS WITHOUT CONTRAST TECHNIQUE: Multidetector CT imaging of the abdomen and pelvis was performed following the standard protocol without IV contrast. RADIATION DOSE REDUCTION: This exam was performed according to the departmental dose-optimization program which includes automated exposure control, adjustment of the mA and/or kV according to patient size and/or use of iterative reconstruction technique. COMPARISON:  CTA chest 0328 hours today. CT Abdomen and Pelvis 05/27/2020. FINDINGS: Lower chest: Mildly improved lung volumes from the CTA chest earlier today (please see that report) no pericardial or pleural effusion. Hepatobiliary: Negative largely noncontrast liver and gallbladder. Pancreas: Negative. Spleen: Negative. Adrenals/Urinary Tract: Normal adrenal glands. Symmetric renal contrast excretion to the collecting systems and ureters. Superimposed chronic and benign appearing renal cysts including parapelvic cysts on the left (no follow-up imaging recommended). Decompressed ureters. Excreted oral contrast throughout the urinary bladder. Stomach/Bowel: Decompressed large bowel from the distal transverse colon on word. Redundant transverse colon with mild retained stool. Negative right colon. Negative appendix series 3, image 51. Negative terminal ileum. No dilated small bowel. Negative stomach and  duodenum. No free air or free fluid identified. Vascular/Lymphatic: Extensive Aortoiliac calcified atherosclerosis. Normal caliber abdominal aorta. No residual intravascular contrast to evaluate for patency. No lymphadenopathy identified. Reproductive: Chronically absent uterus, diminutive or absent ovaries. Other: No pelvis free fluid. Musculoskeletal: Diffuse idiopathic skeletal hyperostosis (DISH). Lower thoracic through upper lumbar interbody ankylosis via flowing endplate osteophytes and superimposed lumbosacral posterior and interbody chronic spinal fusion hardware. Bilateral pedicle screw loosening since 2022 at the L1 and L2 vertebral levels. L2-L3 pseudoarthrosis. L5-S1 pseudoarthrosis and  loosening of bilateral S1 and iliac bone screws which were not present in 2022. Right hip arthroplasty.  Left hip degeneration. No superimposed acute osseous abnormality identified. IMPRESSION: 1. No acute or inflammatory process identified in the largely non-contrast abdomen or pelvis. 2. 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 But loosening of the L1, S2, S1, and pelvic bone screws. And pseudoarthrosis at L2-L3 and L5-S1. 3. Advanced  Aortic Atherosclerosis (ICD10-I70.0). Electronically Signed   By: Genevie Ann M.D.   On: 04/23/2022 05:18   CT Angio Chest PE W and/or Wo Contrast  Result Date: 04/23/2022 CLINICAL DATA:  Chest pains. EXAM: CT ANGIOGRAPHY CHEST WITH CONTRAST TECHNIQUE: Multidetector CT imaging of the chest was performed using the standard protocol during bolus administration of intravenous contrast. Multiplanar CT image reconstructions and MIPs were obtained to evaluate the vascular anatomy. RADIATION DOSE REDUCTION: This exam was performed according to the departmental dose-optimization program which includes automated exposure control, adjustment of the mA and/or kV according to patient size and/or use of iterative  reconstruction technique. CONTRAST:  59m OMNIPAQUE IOHEXOL 350 MG/ML SOLN COMPARISON:  CTA head and neck 02/15/2022, portable chest today, portable chest 04/19/2022. FINDINGS: Cardiovascular: Pulmonary arteries are normal caliber without visible embolic filling defects. The heart is slightly enlarged. There is no pericardial effusion. There is three-vessel coronary artery calcification heaviest in the LAD and circumflex arteries. There is moderate atherosclerosis in the aorta and great vessels without aneurysm, stenosis or dissection. Pulmonary veins are normal caliber. Mediastinum/Nodes: Again, a heterogeneous mass replaces the left lobe of the thyroid displacing the trachea to the right, unchanged in size at 4.3 x 3.4 cm, with dystrophic calcifications at the posterior aspect. Per the ultrasound report of 03/08/2022, biopsy is recommended unless already done. No axillary or intrathoracic adenopathy is seen. The thoracic trachea and thoracic esophagus are unremarkable. Lungs/Pleura: Again noted eventration and moderate elevation of the right hemidiaphragm. There is diffuse bronchial thickening. There is mild centrilobular and paraseptal emphysematous disease in the upper lobes. There is linear scarring or atelectasis in the left lower lobe posterior base. No confluent pulmonary infiltrates or nodules are seen. There is no pleural effusion or pneumothorax. Upper Abdomen: No acute abnormality. 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 No suspicious bone lesions or chest wall abnormality. Review of the MIP images confirms the above findings. IMPRESSION: 1. No evidence of arterial dilatation or embolus. 2. Aortic and coronary artery atherosclerosis. 3. Mild cardiomegaly. 4. COPD. 5. 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 Per the ultrasound report of 03/08/2022, biopsy is recommended unless already done. 6. Extensive spinal bridging enthesopathy. 7. Elevated right  hemidiaphragm. Aortic Atherosclerosis (ICD10-I70.0) and Emphysema (ICD10-J43.9). Electronically Signed   By: KTelford NabM.D.   On: 04/23/2022 04:20   DG Chest Portable 1 View  Result Date: 04/23/2022 CLINICAL DATA:  Chest pain, vomiting EXAM: PORTABLE CHEST 1 VIEW COMPARISON:  04/19/2022 FINDINGS: Minimal left basilar linear scarring. Lungs are otherwise clear. No pneumothorax or pleural effusion. Cardiac size within normal limits. Pulmonary vascularity is normal. No acute bone abnormality. IMPRESSION: No active disease. Electronically Signed   By: AFidela SalisburyM.D.   On: 04/23/2022 00:46   DG Hip Unilat W or Wo Pelvis 1 View Right  Result Date: 04/19/2022 CLINICAL DATA:  pain; hx of replacement EXAM: DG HIP (WITH OR WITHOUT PELVIS) 1V RIGHT COMPARISON:  X-ray pelvis 01/19/2022 FINDINGS: Total right hip arthroplasty. No radiographic findings suggest surgical  hardware complication. No acute displaced fracture or dislocation of the left hip on frontal view. At least moderate degenerative changes left hip. There is no evidence of arthropathy or other focal bone abnormality. Lumbo sacroiliac surgical hardware. Vascular calcifications.  Right thigh vascular stents. IMPRESSION: 1. Negative for acute traumatic injury. 2. Total right hip arthroplasty. No radiographic findings suggest surgical hardware complication. Electronically Signed   By: Iven Finn M.D.   On: 04/19/2022 23:52   DG Chest Port 1 View  Result Date: 04/19/2022 CLINICAL DATA:  Upper chest pain. EXAM: PORTABLE CHEST 1 VIEW COMPARISON:  PA Lat 04/21/2020 FINDINGS: The cardiomediastinal silhouette and vasculature are normal except for calcification in the aortic arch. There is left infrahilar linear scarring or atelectasis. The lungs are clear of infiltrates. Mild elevation of the right diaphragm is again seen. The sulci are sharp. There is thoracic spondylosis. IMPRESSION: No evidence of acute cardiopulmonary disease. Left infrahilar  linear scarring or atelectasis. Aortic atherosclerosis. Electronically Signed   By: Telford Nab M.D.   On: 04/19/2022 21:00   DG Shoulder Right  Result Date: 04/10/2022 CLINICAL DATA:  Right shoulder pain EXAM: RIGHT SHOULDER - 2+ VIEW COMPARISON:  None Available. FINDINGS: No fracture or malalignment. Prominent inferior acromial spurring. AC joint and glenohumeral degenerative change. Right apex is clear IMPRESSION: No acute osseous abnormality. Degenerative changes with prominent inferior acromial spurring. Electronically Signed   By: Donavan Foil M.D.   On: 04/10/2022 20:28     I spent 90  minutes for this patient encounter including review of prior medical records/discussing diagnostics and treatment plan with the patient/family/coordinate care with primary/other specialits with greater than 50% of time in face to face encounter.   Electronically signed by:   Rosiland Oz, MD Infectious Disease Physician [facility] Infectious Disease Pager: [phone]

## 2022-05-10 ENCOUNTER — Other Ambulatory Visit (HOSPITAL_COMMUNITY): Payer: Self-pay

## 2022-05-10 ENCOUNTER — Other Ambulatory Visit: Payer: Self-pay

## 2022-05-10 DIAGNOSIS — M00219 Other streptococcal arthritis, unspecified shoulder: Secondary | ICD-10-CM | POA: Diagnosis not present

## 2022-05-10 LAB — CBC
HCT: 25.3 % — ABNORMAL LOW (ref 36.0–46.0)
Hemoglobin: 7.9 g/dL — ABNORMAL LOW (ref 12.0–15.0)
MCH: 26 pg (ref 26.0–34.0)
MCHC: 31.2 g/dL (ref 30.0–36.0)
MCV: 83.2 fL (ref 80.0–100.0)
Platelets: 378 10*3/uL (ref 150–400)
RBC: 3.04 MIL/uL — ABNORMAL LOW (ref 3.87–5.11)
RDW: 15.7 % — ABNORMAL HIGH (ref 11.5–15.5)
WBC: 13.9 10*3/uL — ABNORMAL HIGH (ref 4.0–10.5)
nRBC: 0 % (ref 0.0–0.2)

## 2022-05-10 LAB — GLUCOSE, CAPILLARY
Glucose-Capillary: 127 mg/dL — ABNORMAL HIGH (ref 70–99)
Glucose-Capillary: 164 mg/dL — ABNORMAL HIGH (ref 70–99)
Glucose-Capillary: 164 mg/dL — ABNORMAL HIGH (ref 70–99)
Glucose-Capillary: 175 mg/dL — ABNORMAL HIGH (ref 70–99)
Glucose-Capillary: 218 mg/dL — ABNORMAL HIGH (ref 70–99)

## 2022-05-10 LAB — BASIC METABOLIC PANEL
Anion gap: 7 (ref 5–15)
BUN: 18 mg/dL (ref 8–23)
CO2: 23 mmol/L (ref 22–32)
Calcium: 7.7 mg/dL — ABNORMAL LOW (ref 8.9–10.3)
Chloride: 104 mmol/L (ref 98–111)
Creatinine, Ser: 0.89 mg/dL (ref 0.44–1.00)
GFR, Estimated: >60 mL/min (ref >60)
Glucose, Bld: 211 mg/dL — ABNORMAL HIGH (ref 70–99)
Potassium: 3.8 mmol/L (ref 3.5–5.1)
Sodium: 134 mmol/L — ABNORMAL LOW (ref 135–145)

## 2022-05-10 LAB — MAGNESIUM: Magnesium: 1.8 mg/dL (ref 1.7–2.4)

## 2022-05-10 LAB — CK: Total CK: 116 U/L (ref 38–234)

## 2022-05-10 MED ORDER — ATORVASTATIN CALCIUM 40 MG PO TABS: 80.0000 mg | ORAL_TABLET | Freq: Every day | ORAL | Status: DC

## 2022-05-10 MED ORDER — ATORVASTATIN CALCIUM 40 MG PO TABS
40.0000 mg | ORAL_TABLET | Freq: Every day | ORAL | Status: DC
  Administered 2022-05-11: 40 mg via ORAL
  Filled 2022-05-10 (×2): qty 1

## 2022-05-10 MED ORDER — SODIUM CHLORIDE 0.9 % IV SOLN
450.0000 mg | Freq: Every day | INTRAVENOUS | Status: DC
  Administered 2022-05-10 – 2022-05-11 (×2): 450 mg via INTRAVENOUS
  Filled 2022-05-10 (×3): qty 9

## 2022-05-10 NOTE — Progress Notes (Signed)
PROGRESS NOTE    April Shaffer  [id number] DOB: [birth year] DOA: 05/07/2022 PCP: Sonia Side., FNP   Brief Narrative:   72 y.o. female with medical history significant of DM-2, HTN, HLD, right carotid artery stenosis-who was referred from orthopedic office for left shoulder septic arthritis.  She was seen outpatient orthopedic due to worsening of pain, Gram stain was positive for gram Stable cocci concerning for septic arthritis and admitted to the hospital for further management.  Orthopedic team and infectious disease was consulted.  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  MRI showing septic arthritis with large effusion.  Orthopedic following, underwent arthroscopy with debridement and loose body removal on 3/12.  Status post right hip aspiration by IR 3/12 Infectious is consulted.  Antibiotic management per their service.  DM-2, insulin-dependent Peripheral neuropathy Sliding scale and Accu-Cheks. Gabapentin   HTN Continue amlodipine/Coreg.  IV as needed   HLD Lipitor, Zetia   History of right carotid endarterectomy History of left carotid artery stenosis At home patient is on Plavix and statin.  May have to reduce statin if patient will be on daptomycin   History of B12 deficiency Anemia due to chronic disease Follows with Dr. [facility] -hold B12 as >1000 on lab   DVT prophylaxis: SCDs Start: 05/08/22 1705 Code Status: Full Code Family  Communication: Family at bedside  Status is: Inpatient Cont hosp stay for IV Abx, concerns of septic arthritis      Subjective:  Feels better no new complaints  Examination:  Constitutional: Not in acute distress Respiratory: Clear to auscultation bilaterally Cardiovascular: Normal sinus rhythm, no rubs Abdomen: Nontender nondistended good bowel sounds Musculoskeletal: No edema noted Skin: No rashes seen Neurologic: CN 2-12 grossly intact.  And nonfocal Psychiatric: Normal judgment and insight. Alert and oriented x 3. Normal mood. Objective: Vitals:   05/09/22 1448 05/09/22 2025 05/10/22 0423 05/10/22 0819  BP: (!) 98/50 (!) 114/56 (!) 131/56 (!) 132/57  Pulse: 73 67 (!) 58 67  Resp: '18 20 18 20  '$ Temp: 97.6 F (36.4 C) 97.8 F (36.6 C) 97.9 F (36.6 C) 98.5 F (36.9 C)  TempSrc: Oral Oral Oral Oral  SpO2: 100% 100% 100% 100%  Weight:      Height:        Intake/Output Summary (Last 24 hours) at 05/10/2022 1154 Last data filed at 05/10/2022 1152 Gross per 24 hour  Intake 1102.9 ml  Output 1100 ml  Net 2.9 ml   Filed Weights   05/07/22 1602 05/08/22 1209  Weight: 59 kg 59 kg     Data Reviewed:   CBC: Recent Labs  Lab 05/07/22 1643 05/08/22 0106 05/10/22 0506  WBC 9.8 8.8 13.9*  NEUTROABS 6.8  --   --   HGB 9.0* 8.1* 7.9*  HCT 29.7* 26.4* 25.3*  MCV 85.8 85.2 83.2  PLT 427* 382 [id number]   Basic Metabolic Panel: Recent Labs  Lab 05/07/22 1643 05/08/22 0106 05/10/22 0506  NA 134* 135 134*  K 3.6 3.6 3.8  CL 101 105 104  CO2 25  24 23  GLUCOSE 200* 170* 211*  BUN '9 10 18  '$ CREATININE 0.88 0.84 0.89  CALCIUM 8.0* 7.9* 7.7*  MG  --   --  1.8   GFR: Estimated Creatinine Clearance: 49.3 mL/min (by C-G formula based on SCr of 0.89 mg/dL). Liver Function Tests: Recent Labs  Lab 05/07/22 1643  AST 16  ALT 13  ALKPHOS 123  BILITOT 0.4  PROT 8.5*  ALBUMIN 2.3*   No results for input(s): "LIPASE", "AMYLASE" in the last 168 hours. No results for  input(s): "AMMONIA" in the last 168 hours. Coagulation Profile: No results for input(s): "INR", "PROTIME" in the last 168 hours. Cardiac Enzymes: Recent Labs  Lab 05/10/22 0506  CKTOTAL 116   BNP (last 3 results) No results for input(s): "PROBNP" in the last 8760 hours. HbA1C: No results for input(s): "HGBA1C" in the last 72 hours. CBG: Recent Labs  Lab 05/09/22 2022 05/10/22 0010 05/10/22 0420 05/10/22 0753 05/10/22 1123  GLUCAP 255* 164* 175* 127* 164*   Lipid Profile: No results for input(s): "CHOL", "HDL", "LDLCALC", "TRIG", "CHOLHDL", "LDLDIRECT" in the last 72 hours. Thyroid Function Tests: No results for input(s): "TSH", "T4TOTAL", "FREET4", "T3FREE", "THYROIDAB" in the last 72 hours. Anemia Panel: Recent Labs    05/07/22 1647  VITAMINB12 1,253*  FOLATE 5.8*  FERRITIN 1,046*  TIBC 155*  IRON 21*  RETICCTPCT 0.8   Sepsis Labs: Recent Labs  Lab 05/07/22 1631 05/07/22 1945  LATICACIDVEN 3.0* 1.8    Recent Results (from the past 240 hour(s))  Culture, blood (routine x 2)     Status: None (Preliminary result)   Collection Time: 05/07/22  4:43 PM   Specimen: BLOOD  Result Value Ref Range Status   Specimen Description   Final    BLOOD RIGHT ANTECUBITAL Performed at [facility], [place] [address], [place], [place] [address]    Special Requests   Final    BOTTLES DRAWN AEROBIC AND ANAEROBIC Blood Culture adequate volume Performed at [facility] [address], [place], [place] [address]    Culture   Final    NO GROWTH 3 DAYS Performed at [facility], [place] [address], [place], [place] [address]    Report Status PENDING  Incomplete  Culture, blood (routine x 2)     Status: None (Preliminary result)   Collection Time: 05/07/22  5:01 PM   Specimen: BLOOD  Result Value Ref Range Status   Specimen Description   Final    BLOOD LEFT ANTECUBITAL Performed at [facility], [place] [address] [place], [place] [address]     Special Requests   Final    BOTTLES DRAWN AEROBIC AND ANAEROBIC Blood Culture adequate volume Performed at [facility] [address], [place], [place] [address]    Culture   Final    NO GROWTH 3 DAYS Performed at [facility], [place] [address] [place], [place] [address]    Report Status PENDING  Incomplete  Surgical pcr screen     Status: None   Collection Time: 05/08/22  8:51 AM   Specimen: Nasal Mucosa; Nasal Swab  Result Value Ref Range Status   MRSA, PCR NEGATIVE NEGATIVE Final   Staphylococcus aureus NEGATIVE NEGATIVE Final    Comment: (NOTE) The Xpert SA Assay (FDA approved for NASAL specimens in patients 48 years of age and older), is one component of a comprehensive surveillance program. It is not intended to diagnose infection nor to guide or  monitor treatment. Performed at [facility], [place] [address], [place], [place] [address]   Aerobic/Anaerobic Culture w Gram Stain (surgical/deep wound)     Status: None (Preliminary result)   Collection Time: 05/08/22 10:56 AM   Specimen: Joint, Right Hip; Synovial Fluid  Result Value Ref Range Status   Specimen Description   Final    SYNOVIAL Performed at [facility] [address], [place], [place] [address]    Special Requests   Final    RIGHT HIP Performed at [facility] [address], [place], [place] [address]    Gram Stain NO WBC SEEN NO ORGANISMS SEEN   Final   Culture   Final    NO GROWTH 2 DAYS Performed at [facility], [place] [address], [place], [place] [address]    Report Status PENDING  Incomplete  Aerobic/Anaerobic Culture w Gram Stain (surgical/deep wound)     Status: None (Preliminary result)   Collection Time: 05/08/22  1:50 PM   Specimen: Joint, Other; Body Fluid  Result Value Ref Range Status   Specimen Description   Final    FLUID Performed at [facility] [address] [place] [address], [place],  [place] [address]    Special Requests   Final    JOINT Performed at [facility], [place] [address] [place], [place] [address]    Gram Stain   Final    ABUNDANT WBC PRESENT, PREDOMINANTLY PMN NO ORGANISMS SEEN    Culture   Final    CULTURE REINCUBATED FOR BETTER GROWTH Performed at [facility], [place] [address] [place],  [address]    Report Status PENDING  Incomplete         Radiology Studies: [place] EKG SITE RITE  Result Date: 05/10/2022 If Site Rite image not attached, placement could not be confirmed due to current cardiac rhythm.       Scheduled Meds:  amLODipine  10 mg Oral q AM   atorvastatin  40 mg Oral Daily   carvedilol  20 mg Oral Daily   clopidogrel  75 mg Oral Daily   docusate sodium  100 mg Oral BID   ezetimibe  10 mg Oral Daily   ferrous sulfate  325 mg Oral Q breakfast   gabapentin  300 mg Oral TID   insulin aspart  0-9 Units Subcutaneous Q4H   pantoprazole  40 mg Oral Daily   sodium chloride flush  3 mL Intravenous Q12H   Continuous Infusions:  sodium chloride     DAPTOmycin (CUBICIN) 500 mg in sodium chloride 0.9 % IVPB 120 mL/hr at 05/09/22 1800   methocarbamol (ROBAXIN) IV Stopped (05/08/22 1705)     LOS: 3 days   Time spent= 35 mins    Ariona Deschene Arsenio Loader, MD [facility]  If 7PM-7AM, please contact night-coverage  05/10/2022, 11:54 AM

## 2022-05-10 NOTE — Plan of Care (Signed)
  Problem: Education: Goal: Ability to describe self-care measures that may prevent or decrease complications (Diabetes Survival Skills Education) will improve Outcome: Progressing Goal: Individualized Educational Video(s) Outcome: Progressing   Problem: Coping: Goal: Ability to adjust to condition or change in health will improve Outcome: Progressing   

## 2022-05-10 NOTE — Progress Notes (Signed)
Mobility Specialist - Progress Note   05/10/22 1000  Mobility  Activity Ambulated independently in room  Level of Assistance Modified independent, requires aide device or extra time  Assistive Device [facility] Ambulated (ft) 200 ft  Mobility Referral Yes  $Mobility charge 1 Mobility   Pt received in chair and agreed to mobility, had no c/o pain nor discomfort during session. Pt returned to chair with all needs met.  Roderick Pee Mobility Specialist

## 2022-05-10 NOTE — Progress Notes (Signed)
Subjective: Patient reports pain as mild to moderate. Tolerating diet.  Urinating.   No CP, SOB.  Moving well with PT/OT.  Objective:   VITALS:   Vitals:   05/09/22 2025 05/10/22 0423 05/10/22 0819 05/10/22 1333  BP: (!) 114/56 (!) 131/56 (!) 132/57 (!) 101/52  Pulse: 67 (!) 58 67 68  Resp: '20 18 20 16  '$ Temp: 97.8 F (36.6 C) 97.9 F (36.6 C) 98.5 F (36.9 C) 98 F (36.7 C)  TempSrc: Oral Oral Oral Oral  SpO2: 100% 100% 100% 99%  Weight:      Height:          Latest Ref Rng & Units 05/10/2022    5:06 AM 05/08/2022    1:06 AM 05/07/2022    4:43 PM  CBC  WBC 4.0 - 10.5 K/uL 13.9  8.8  9.8   Hemoglobin 12.0 - 15.0 g/dL 7.9  8.1  9.0   Hematocrit 36.0 - 46.0 % 25.3  26.4  29.7   Platelets 150 - 400 K/uL 378  382  427       Latest Ref Rng & Units 05/10/2022    5:06 AM 05/08/2022    1:06 AM 05/07/2022    4:43 PM  BMP  Glucose 70 - 99 mg/dL 211  170  200   BUN 8 - 23 mg/dL '18  10  9   '$ Creatinine 0.44 - 1.00 mg/dL 0.89  0.84  0.88   Sodium 135 - 145 mmol/L 134  135  134   Potassium 3.5 - 5.1 mmol/L 3.8  3.6  3.6   Chloride 98 - 111 mmol/L 104  105  101   CO2 22 - 32 mmol/L '23  24  25   '$ Calcium 8.9 - 10.3 mg/dL 7.7  7.9  8.0    Intake/Output      03/13 0701 03/14 0700 03/14 0701 03/15 0700   P.O. 1280 240   I.V. (mL/kg)     IV Piggyback 42.9    Total Intake(mL/kg) 1322.9 (22.4) 240 (4.1)   Urine (mL/kg/hr) 700 (0.5) 800 (1.7)   Blood     Total Output 700 800   Net +622.9 -560        Urine Occurrence 1 x         Assessment: 2 Days Post-Op  S/P Procedure(s) (LRB): ARTHROSCOPY SHOULDER (Left) ARTHROSCOPIC INCISION AND DRAINAGE ABSCESS, DISTAL CLAVICLE EXCISSION, SUBACROMIAL DECOMPRESSION, LOOSE BODY REMOVAL, EXTENSIVE DEBRIDEMENT (Left) by Dr. Ernesta Amble. Percell Miller on 05/08/22  Principal Problem:   Septic arthritis of shoulder ([place]) Active Problems:   DM type 2 with diabetic peripheral neuropathy ([place])   Hyperlipidemia associated with type 2 diabetes  mellitus ([place])   Essential hypertension   Carotid artery occlusion without infarction, right   Plan: Patient seen by ID. Current plan is for Daptomycin IV x 6 weeks L shoulder intra-op specimen showing rare Staph aureus like office specimen showed. Susceptibilities to follow. R hip joint specimen from IR aspirate NGTD   Advance diet Up with therapy Incentive Spirometry Elevate and Apply ice  Weightbearing: WBAT LUE Insicional and dressing care: Dressings left intact until follow-up and Reinforce dressings as needed Orthopedic device(s):  sling for comfort Showering: Keep dressing dry VTE prophylaxis:  can restart home Plavix , SCDs, ambulation Pain control: continue current regimen Follow - up plan: 1 week after surgery Contact information:  Edmonia Lynch MD, Aggie Moats PA-C  Dispo:  Ok to discharge home from an orthopedic standpoint. Will need to  make an appt to be seen in the office in 1 week for wound check .     Britt Bottom, PA-C Office [phone] 05/10/2022, 3:11 PM

## 2022-05-10 NOTE — Progress Notes (Addendum)
PHARMACY CONSULT NOTE FOR:  OUTPATIENT  PARENTERAL ANTIBIOTIC THERAPY (OPAT)  Indication: L shoulder septic arthritis Regimen: Daptomycin 450 mg (~8 mg/kg) IV daily x 6 weeks End date: 06/20/22  Pt is on atorvastatin 80 mg daily for ASCVD secondary prevention with PMH of right carotid endarterectomy and planned L transcartoid artery stenting. Given prior documentation of elevated CK on daptomycin in 2022 and prolonged tx duration, would recommend reducing dose to atorvastatin 40 mg daily during daptomycin course. Monitor CK twice weekly and adjust regimen as needed.    IV antibiotic discharge orders are pended. To discharging provider:  please sign these orders via discharge navigator,  Select New Orders & click on the button choice - Manage This Unsigned Work.   Thank you for allowing pharmacy to be a part of this patient's care.  Park Liter, PharmD Candidate 05/10/2022, 9:30 AM

## 2022-05-10 NOTE — TOC Benefit Eligibility Note (Signed)
Patient Teacher, English as a foreign language completed.    The patient is currently admitted and upon discharge could be taking rosuvastatin (Crestor) 20 mg tablets.  The current 30 day co-pay is $0.00.   The patient is currently admitted and upon discharge could be taking rosuvastatin (Crestor) 40 mg tablets.  The current 30 day co-pay is $0.00.   The patient is insured through [place], [place] Patient Advocate Specialist [place] Patient Advocate Team Direct Number: [phone]  Fax: [phone]

## 2022-05-10 NOTE — TOC Initial Note (Signed)
Transition of Care [facility]) - Initial/Assessment Note    Patient Details  Name: April Shaffer MRN: [id number] Date of Birth: Oct 30, 1950  Transition of [facility]) CM/SW Contact:    Dessa Phi, RN Phone Number: 05/10/2022, 3:51 PM  Clinical Narrative:Pam-[facility]-iv abx ;supplies following;[facility] rep Cory HHRN-await orders, iv abx orders,picc line.                   Expected Discharge Plan: [place] Barriers to Discharge: Continued Medical Work up   Patient Goals and CMS Choice            Expected Discharge Plan and Services   Discharge Planning Services: CM Consult   Living arrangements for the past 2 months: Single Family Home                           HH Arranged: RN, IV Antibiotics HH Agency: [facility], [place] Date [facility] Agency Contacted: 05/10/22 Time HH Agency Contacted: 68 Representative spoke with at [place]: Pam[facility];Cory-[facility](HHRN)  Prior Living Arrangements/Services Living arrangements for the past 2 months: [place] with:: Significant Other   Do you feel safe going back to the place where you live?: Yes               Activities of Daily Living Home Assistive Devices/Equipment: Eyeglasses, CBG Meter, Cane (specify quad or straight) ADL Screening (condition at time of admission) Patient's cognitive ability adequate to safely complete daily activities?: Yes Is the patient deaf or have difficulty hearing?: No Does the patient have difficulty seeing, even when wearing glasses/contacts?: No Does the patient have difficulty concentrating, remembering, or making decisions?: No Patient able to express need for assistance with ADLs?: Yes Does the patient have difficulty dressing or bathing?: No Independently performs ADLs?: Yes (appropriate for developmental age) Does the patient have difficulty walking or climbing stairs?: No Weakness of Legs: None Weakness of Arms/Hands: Left  Permission  Sought/Granted Permission sought to share information with : Case Manager Permission granted to share information with : Yes, Verbal Permission Granted              Emotional Assessment              Admission diagnosis:  Joint infection ([place]) [M00.9] Septic arthritis of shoulder ([place]) [M00.9] Acute pain of left shoulder [M25.512] Patient Active Problem List   Diagnosis Date Noted   Septic arthritis of shoulder ([place]) 05/07/2022   Anemia of chronic disease 04/10/2022   Lumbar pseudoarthrosis 01/24/2022   Occlusion and stenosis of right carotid artery 01/24/2022   Iron deficiency anemia secondary to blood loss (chronic) 01/08/2022   B12 deficiency 08/31/2021   Medication monitoring encounter 12/12/2020   Nausea 12/12/2020   Hammer toes of both feet 11/09/2020   Acute maxillary sinusitis 08/08/2020   Hyperlipidemia 08/08/2020   Pain due to onychomycosis of toenails of both feet 08/08/2020   Therapeutic drug monitoring 06/22/2020   Wound infection after surgery 05/25/2020   Wound drainage 05/24/2020   Local infection of the skin and subcutaneous tissue, unspecified 05/24/2020   Body mass index (BMI) 27.0-27.9, adult 05/10/2020   S/P lumbar fusion 04/25/2020   Microalbuminuria due to type 2 diabetes mellitus ([place]) 08/07/2019   Primary localized osteoarthritis of right hip 08/22/2018   Carpal tunnel syndrome, bilateral 08/06/2018   Type 2 diabetes mellitus with peripheral neuropathy ([place]) 08/06/2018   Degenerative joint disease of right hip 08/06/2018  Preoperative clearance 03/26/2018   Upper back pain on left side 10/30/2016   Encounter for postoperative carotid endarterectomy surveillance 12/07/2015   Varicosities of leg 06/03/2015   Acute left lumbar radiculopathy 04/15/2014   Chronic sciatica of right side 03/17/2013   Diabetic peripheral neuropathy ([place]) 03/17/2013   Coronary artery disease    Carotid artery stenosis s/p CEA 2013 10/16/2011   Carotid artery  occlusion without infarction, right 09/17/2011   Carotid bruit 08/28/2011   Gout 04/13/2011   DM type 2 with diabetic peripheral neuropathy ([place]) 04/02/2008   Hyperlipidemia associated with type 2 diabetes mellitus ([place]) 04/02/2008   Essential hypertension 04/02/2008   DEGENERATIVE [place] DISEASE, LUMBOSACRAL SPINE 04/02/2008   PCP:  Sonia Side., FNP Pharmacy:   [facility] [address] [place] [address] Phone: [phone] Fax: [phone]  [place] [facility] [place], [place] [address] Phone: [phone] Fax: [phone]     Social Determinants of Health (SDOH) Social History: SDOH Screenings   Food Insecurity: No Food Insecurity (05/08/2022)  Housing: Low Risk  (05/08/2022)  Transportation Needs: No Transportation Needs (05/08/2022)  Utilities: Not At Risk (05/08/2022)  Depression (PHQ2-9): Low Risk  (12/27/2020)  Financial Resource Strain: Low Risk  (08/06/2018)  Physical Activity: Inactive (06/17/2017)  Social Connections: Unknown (08/06/2018)  Stress: No Stress Concern Present (06/17/2017)  Tobacco Use: High Risk (05/09/2022)   SDOH Interventions:     Readmission Risk Interventions     No data to display

## 2022-05-11 DIAGNOSIS — M00219 Other streptococcal arthritis, unspecified shoulder: Secondary | ICD-10-CM | POA: Diagnosis not present

## 2022-05-11 LAB — CBC
HCT: 24.3 % — ABNORMAL LOW (ref 36.0–46.0)
Hemoglobin: 7.6 g/dL — ABNORMAL LOW (ref 12.0–15.0)
MCH: 26.3 pg (ref 26.0–34.0)
MCHC: 31.3 g/dL (ref 30.0–36.0)
MCV: 84.1 fL (ref 80.0–100.0)
Platelets: 343 10*3/uL (ref 150–400)
RBC: 2.89 MIL/uL — ABNORMAL LOW (ref 3.87–5.11)
RDW: 15.5 % (ref 11.5–15.5)
WBC: 9.3 10*3/uL (ref 4.0–10.5)
nRBC: 0 % (ref 0.0–0.2)

## 2022-05-11 LAB — BASIC METABOLIC PANEL
Anion gap: 8 (ref 5–15)
BUN: 17 mg/dL (ref 8–23)
CO2: 23 mmol/L (ref 22–32)
Calcium: 7.7 mg/dL — ABNORMAL LOW (ref 8.9–10.3)
Chloride: 104 mmol/L (ref 98–111)
Creatinine, Ser: 0.76 mg/dL (ref 0.44–1.00)
GFR, Estimated: >60 mL/min (ref >60)
Glucose, Bld: 167 mg/dL — ABNORMAL HIGH (ref 70–99)
Potassium: 4 mmol/L (ref 3.5–5.1)
Sodium: 135 mmol/L (ref 135–145)

## 2022-05-11 LAB — GLUCOSE, CAPILLARY
Glucose-Capillary: 105 mg/dL — ABNORMAL HIGH (ref 70–99)
Glucose-Capillary: 114 mg/dL — ABNORMAL HIGH (ref 70–99)
Glucose-Capillary: 122 mg/dL — ABNORMAL HIGH (ref 70–99)
Glucose-Capillary: 240 mg/dL — ABNORMAL HIGH (ref 70–99)

## 2022-05-11 LAB — MAGNESIUM: Magnesium: 1.7 mg/dL (ref 1.7–2.4)

## 2022-05-11 MED ORDER — ATORVASTATIN CALCIUM 80 MG PO TABS: ORAL_TABLET | ORAL | 11 refills | Status: DC

## 2022-05-11 MED ORDER — HEPARIN SOD (PORK) LOCK FLUSH 100 UNIT/ML IV SOLN
250.0000 [IU] | INTRAVENOUS | Status: AC | PRN
  Administered 2022-05-11: 250 [IU]

## 2022-05-11 MED ORDER — ATORVASTATIN CALCIUM 40 MG PO TABS: 40.0000 mg | ORAL_TABLET | Freq: Every day | ORAL | 0 refills | Status: DC

## 2022-05-11 MED ORDER — PANTOPRAZOLE SODIUM 40 MG PO TBEC: 40.0000 mg | DELAYED_RELEASE_TABLET | Freq: Every day | ORAL | 0 refills | Status: DC

## 2022-05-11 MED ORDER — CHLORHEXIDINE GLUCONATE CLOTH 2 % EX PADS
6.0000 | MEDICATED_PAD | Freq: Every day | CUTANEOUS | Status: DC
  Administered 2022-05-11: 6 via TOPICAL

## 2022-05-11 MED ORDER — ASPIRIN 81 MG PO TBEC
81.0000 mg | DELAYED_RELEASE_TABLET | Freq: Every morning | ORAL | Status: DC
  Administered 2022-05-11: 81 mg via ORAL
  Filled 2022-05-11: qty 1

## 2022-05-11 MED ORDER — DOCUSATE SODIUM 100 MG PO CAPS: 200.0000 mg | ORAL_CAPSULE | Freq: Two times a day (BID) | ORAL | 0 refills | Status: DC | PRN

## 2022-05-11 MED ORDER — SODIUM CHLORIDE 0.9% FLUSH: 10.0000 mL | INTRAVENOUS | Status: DC | PRN

## 2022-05-11 MED ORDER — DAPTOMYCIN IV (FOR PTA / DISCHARGE USE ONLY): 450.0000 mg | INTRAVENOUS | 0 refills | Status: AC

## 2022-05-11 NOTE — TOC Transition Note (Addendum)
Transition of Care [facility]) - CM/SW Discharge Note   Patient Details  Name: April Shaffer MRN: [id number] Date of Birth: [birth year]  Transition [facility] Care [facility]) CM/SW Contact:  Dessa Phi, RN Phone Number: 05/11/2022, 10:48 AM   Clinical Narrative:  awaiting to confirm d/c today.[facility] following know for IV abx infusion to set up meds for pharmacy, & [facility] visit tomorrow.  -12:17p-confirmed [facility] rep Pam to start iv abx home meds tomorrow. No further CM needs.     Final next level of care: [place] Barriers to Discharge: Continued Medical Work up   Patient Goals and CMS Choice      Discharge Placement                         Discharge Plan and Services Additional resources added to the After Visit Summary for     Discharge Planning Services: CM Consult                      HH Arranged: RN, IV Antibiotics HH Agency: [facility], [place] Date [facility] Agency Contacted: 05/10/22 Time HH Agency Contacted: [phone] Representative spoke with at [place]: [facility];Cory-[facility](HHRN)  Social Determinants of Health ([place]) Interventions [place]: No Food Insecurity (05/08/2022)  Housing: Low Risk  (05/08/2022)  Transportation Needs: No Transportation Needs (05/08/2022)  Utilities: Not At Risk (05/08/2022)  Depression (PHQ2-9): Low Risk  (12/27/2020)  Financial Resource Strain: Low Risk  (08/06/2018)  Physical Activity: Inactive (06/17/2017)  Social Connections: Unknown (08/06/2018)  Stress: No Stress Concern Present (06/17/2017)  Tobacco Use: High Risk (05/09/2022)     Readmission Risk Interventions    05/10/2022    3:52 PM  Readmission Risk Prevention Plan  Transportation Screening Complete  PCP or Specialist Appt within 3-5 Days Complete  HRI or [place] Complete  Social Work Consult for [place] Planning/Counseling Complete  Palliative Care Screening Not Applicable  Medication Review Human resources officer) Complete

## 2022-05-11 NOTE — Plan of Care (Signed)
  Problem: Education: Goal: Ability to describe self-care measures that may prevent or decrease complications (Diabetes Survival Skills Education) will improve Outcome: Adequate for Discharge Goal: Individualized Educational Video(s) Outcome: Adequate for Discharge   Problem: Fluid Volume: Goal: Ability to maintain a balanced intake and output will improve Outcome: Adequate for Discharge   Problem: Health Behavior/Discharge Planning: Goal: Ability to identify and utilize available resources and services will improve Outcome: Adequate for Discharge Goal: Ability to manage health-related needs will improve Outcome: Adequate for Discharge   Problem: Pain Managment: Goal: General experience of comfort will improve Outcome: Adequate for Discharge

## 2022-05-11 NOTE — Progress Notes (Signed)
Peripherally Inserted Central Catheter Placement  The IV Nurse has discussed with the patient and/or persons authorized to consent for the patient, the purpose of this procedure and the potential benefits and risks involved with this procedure.  The benefits include less needle sticks, lab draws from the catheter, and the patient may be discharged home with the catheter. Risks include, but not limited to, infection, bleeding, blood clot (thrombus formation), and puncture of an artery; nerve damage and irregular heartbeat and possibility to perform a PICC exchange if needed/ordered by physician.  Alternatives to this procedure were also discussed.  Bard Power PICC patient education guide, fact sheet on infection prevention and patient information card has been provided to patient /or left at bedside.    PICC Placement Documentation  PICC Single Lumen [id number] Right Basilic 37 cm 0 cm (Active)  Indication for Insertion or Continuance of Line Home intravenous therapies (PICC only) 05/11/22 0816  Exposed Catheter (cm) 0 cm 05/11/22 0816  Site Assessment Clean, Dry, Intact 05/11/22 0816  Line Status Flushed;Saline locked;Blood return noted 05/11/22 0816  Dressing Type Transparent;Securing device 05/11/22 0816  Dressing Status Antimicrobial disc in place 05/11/22 0816  Dressing Intervention New dressing;Other (Comment) 05/11/22 0816  Dressing Change Due 05/18/22 05/11/22 0816       Christella Noa Albarece 05/11/2022, 8:17 AM

## 2022-05-11 NOTE — Discharge Summary (Signed)
Physician Discharge Summary  KARLENE HOSELTON [id number] DOB: [birth year] DOA: 05/07/2022  PCP: Sonia Side., FNP  Admit date: 05/07/2022 Discharge date: 05/11/2022  Admitted From: Home Disposition:  Home with [facility]  Recommendations for Outpatient Follow-up:  Follow up with PCP in 1-2 weeks Please obtain BMP/CBC in one week your next doctors visit.  Daptomycin to be given IV daily, arranged by infectious disease.  Will get lab work as recommended in discharge paperwork.  Last day of antibiotics 4/24 Bowel regimen and PPI has been prescribed Outpatient follow-up with orthopedic-[facility]. Follow-up outpatient infectious disease, arranged by their service PICC line should be removed after completion of IV antibiotic therapy Lipitor dose reduced to 40 mg daily   Discharge Condition: Stable CODE STATUS: Full Diet recommendation: Diabetic  Brief/Interim Summary:  72 y.o. female with medical history significant of DM-2, HTN, HLD, right carotid artery stenosis-who was referred from orthopedic office for left shoulder septic arthritis.  She was seen outpatient orthopedic due to worsening of pain, Gram stain was positive for gram Stable cocci concerning for septic arthritis and admitted to the hospital for further management.  Orthopedic team and infectious disease was consulted.  Eventually hip was also aspirated on 3/12.  Cultures reviewed by infectious disease.  Patient started on daptomycin.  While on daptomycin atorvastatin has been reduced to 50% of dose, off 40 mg daily.  This can be resumed to full dose once antibiotic course has been completed.  In the meantime keep track of CK level/development for any rhabdomyolysis.   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  MRI showing septic arthritis with large effusion.  Orthopedic following, underwent arthroscopy with debridement and loose body removal on 3/12.  Status post right hip aspiration by IR 3/12 Infectious is consulted.  Antibiotic management per their service.  Patient will be on daptomycin until 06/20/2022.  PICC line placed on 3/15.   DM-2, insulin-dependent Peripheral neuropathy Resume home regimen   HTN Continue amlodipine/Coreg.     HLD Lipitor, Zetia   History of right carotid endarterectomy History of left carotid artery stenosis At home patient is on Plavix and statin.  May have to reduce statin if patient will be on daptomycin   History of B12 deficiency Anemia due to chronic disease Follows with Dr. [facility] -hold B12 as >1000 on lab        Discharge Diagnoses:  Principal Problem:   Septic arthritis of shoulder ([place]) Active Problems:   DM type 2 with diabetic peripheral neuropathy ([place])   Hyperlipidemia associated with type 2 diabetes mellitus ([place])   Essential hypertension   Carotid artery occlusion without infarction, right      Consultations: Ortho - [facility] Infectious disease  Subjective: Doing ok no complaints.   Discharge Exam: Vitals:   05/10/22 2004 05/11/22 0427  BP: (!) 104/56 (!) 131/54  Pulse: 74 69  Resp: 18 20  Temp: 98 F (36.7 C) 97.8 F (36.6 C)  SpO2: 99% 100%   Vitals:   05/10/22 0819 05/10/22 1333 05/10/22 2004 05/11/22  0427  BP: (!) 132/57 (!) 101/52 (!) 104/56 (!) 131/54  Pulse: 67 68 74 69  Resp: 20 16 18 20   Temp: 98.5 F (36.9 C) 98 F (36.7 C) 98 F (36.7 C) 97.8 F (36.6 C)  TempSrc: Oral Oral Oral Oral  SpO2:  100% 99% 99% 100%  Weight:      Height:        General: Pt is alert, awake, not in acute distress Cardiovascular: RRR, S1/S2 +, no rubs, no gallops Respiratory: CTA bilaterally, no wheezing, no rhonchi Abdominal: Soft, NT, ND, bowel sounds + Extremities: no edema, no cyanosis Left shoulder dressing in place  Discharge Instructions  Discharge Instructions     [facility] pharmacist to adjust dose for Vancomycin, Aminoglycosides and other anti-infective therapies as requested by physician.   Complete by: As directed    [facility] to provide Cath Flo 2mg    Complete by: As directed    Administer for PICC line occlusion and as ordered by physician for other access device issues.   Anaphylaxis Kit: Provided to treat any anaphylactic reaction to the medication being provided to the patient if First Dose or when requested by physician   Complete by: As directed    Epinephrine 1mg /ml vial / amp: Administer 0.3mg  (0.15ml) subcutaneously once for moderate to severe anaphylaxis, nurse to call physician and pharmacy when reaction occurs and call 911 if needed for immediate care   Diphenhydramine 50mg /ml IV vial: Administer 25-50mg  IV/IM PRN for first dose reaction, rash, itching, mild reaction, nurse to call physician and pharmacy when reaction occurs   Sodium Chloride 0.9% NS 568ml IV: Administer if needed for hypovolemic blood pressure drop or as ordered by physician after call to physician with anaphylactic reaction   Change dressing on IV access line weekly and PRN   Complete by: As directed    Flush IV access with Sodium Chloride 0.9% and Heparin 10 units/ml or 100 units/ml   Complete by: As directed    Home infusion instructions - [facility]   Complete by: As directed    Instructions: Flush IV access with Sodium Chloride 0.9% and Heparin 10units/ml or 100units/ml   Change dressing on IV access line: Weekly and PRN   Instructions Cath Flo 2mg : Administer  for PICC Line occlusion and as ordered by physician for other access device   [facility] pharmacist to adjust dose for: Vancomycin, Aminoglycosides and other anti-infective therapies as requested by physician   Method of administration may be changed at the discretion of home infusion pharmacist based upon assessment of the patient and/or caregiver's ability to self-administer the medication ordered   Complete by: As directed       Allergies as of 05/11/2022       Reactions   Zestoretic [lisinopril-hydrochlorothiazide] Swelling   Angioedema  - Tongue swelling    Codeine Itching   Oxycodone Itching   Patient is taking at this time   Ultram [tramadol] Itching   Zocor [simvastatin] Other (See Comments)   Muscle pain        Medication List     STOP taking these medications    methylPREDNISolone 4 MG Tbpk tablet Commonly known as: MEDROL DOSEPAK       TAKE these medications    acetaminophen 500 MG tablet Commonly known as: TYLENOL Take 500 mg by mouth 2 (two) times daily as needed for moderate pain.   allopurinol 300 MG tablet Commonly known as: ZYLOPRIM Take 1 tablet (300  mg total) by mouth daily. What changed:  when to take this reasons to take this   amLODipine 10 MG tablet Commonly known as: NORVASC Take 10 mg by mouth in the morning.   aspirin EC 81 MG tablet Take 81 mg by mouth in the morning.   atorvastatin 40 MG tablet Commonly known as: LIPITOR Take 1 tablet (40 mg total) by mouth daily. Start taking on: May 12, 2022 What changed:  medication strength how much to take how to take this when to take this additional instructions   carvedilol 20 MG 24 hr capsule Commonly known as: COREG CR Take 1 capsule (20 mg total) by mouth daily.   clopidogrel 75 MG tablet Commonly known as: PLAVIX Take 75 mg by mouth in the morning.   cyanocobalamin 1000 MCG tablet Commonly known as: VITAMIN B12 Take 1,000 mcg by mouth daily.   daptomycin   IVPB Commonly known as: CUBICIN Inject 450 mg into the vein daily. Indication:  L shoulder septic arthritis First Dose: Yes Last Day of Therapy:  06/20/22 Labs - Twice weekly: CPK Labs - Once weekly:  CBC/D, BMP Labs - Every other week:  ESR and CRP Method of administration: IV Push Pull PICC line at completion of IV therapy Method of administration may be changed at the discretion of home infusion pharmacist based upon assessment of the patient and/or caregiver's ability to self-administer the medication ordered.   docusate sodium 100 MG capsule Commonly known as: COLACE Take 2 capsules (200 mg total) by mouth 2 (two) times daily as needed for mild constipation.   ezetimibe 10 MG tablet Commonly known as: ZETIA TAKE 1 TABLET EVERY DAY (NEED MD APPOINTMENT) What changed: See the new instructions.   ferrous sulfate 325 (65 FE) MG tablet Take 325 mg by mouth daily.   gabapentin 300 MG capsule Commonly known as: NEURONTIN Take 300 mg by mouth 3 (three) times daily.   glucose blood test strip Check sugars twice daily   metFORMIN 500 MG tablet Commonly known as: GLUCOPHAGE Take 500 mg by mouth in the morning and at bedtime.   oxyCODONE-acetaminophen 10-325 MG tablet Commonly known as: PERCOCET Take 1 tablet by mouth every 4 (four) hours as needed for pain. What changed: when to take this   pantoprazole 40 MG tablet Commonly known as: PROTONIX Take 1 tablet (40 mg total) by mouth daily before breakfast.               Discharge Care Instructions  (From admission, onward)           Start     Ordered   05/11/22 0000  Change dressing on IV access line weekly and PRN  (Home infusion instructions - [facility] )        05/11/22 1150            Follow-up Information     [name], MD. Schedule an appointment as soon as possible for a visit in 1 week(s).   Specialty: Orthopedic Surgery Why: for a wound check Contact information: [place] [address] [phone]         Sonia Side., FNP Follow up in 1 week(s).   Specialty: Family Medicine Contact information: [place] [address] [phone]                Allergies  Allergen Reactions   Zestoretic [Lisinopril-Hydrochlorothiazide] Swelling    Angioedema  - Tongue swelling  Codeine Itching   Oxycodone Itching    Patient is taking at this time   Ultram [Tramadol] Itching   Zocor [Simvastatin] Other (See Comments)    Muscle pain    You were cared for by a hospitalist during your hospital stay. If you have any questions about your discharge medications or the care you received while you were in the hospital after you are discharged, you can call the unit and asked to speak with the hospitalist on call if the hospitalist that took care of you is not available. Once you are discharged, your primary care physician will handle any further medical issues. Please note that no refills for any discharge medications will be authorized once you are discharged, as it is imperative that you return to your primary care physician (or establish a relationship with a primary care physician if you do not have one) for your aftercare needs so that they can reassess your need for medications and monitor your lab values.  You were cared for by a hospitalist during your hospital stay. If you have any questions about your discharge medications or the care you received while you were in the hospital after you are discharged, you can call the unit and asked to speak with the hospitalist on call if the hospitalist that took care of you is not available. Once you are discharged, your primary care physician will handle any further medical issues. Please note that NO REFILLS for any discharge medications will be authorized once you are discharged, as it is imperative that you return to your primary care physician (or establish a  relationship with a primary care physician if you do not have one) for your aftercare needs so that they can reassess your need for medications and monitor your lab values.  Please request your Prim.MD to go over all Hospital Tests and Procedure/Radiological results at the follow up, please get all Hospital records sent to your Prim MD by signing hospital release before you go home.  Get CBC, CMP, 2 view Chest X ray checked  by Primary MD during your next visit or SNF MD in 5-7 days ( we routinely change or add medications that can affect your baseline labs and fluid status, therefore we recommend that you get the mentioned basic workup next visit with your PCP, your PCP may decide not to get them or add new tests based on their clinical decision)  On your next visit with your primary care physician please Get Medicines reviewed and adjusted.  If you experience worsening of your admission symptoms, develop shortness of breath, life threatening emergency, suicidal or homicidal thoughts you must seek medical attention immediately by calling 911 or calling your MD immediately  if symptoms less severe.  You Must read complete instructions/literature along with all the possible adverse reactions/side effects for all the Medicines you take and that have been prescribed to you. Take any new Medicines after you have completely understood and accpet all the possible adverse reactions/side effects.   Do not drive, operate heavy machinery, perform activities at heights, swimming or participation in water activities or provide baby sitting services if your were admitted for syncope or siezures until you have seen by Primary MD or a Neurologist and advised to do so again.  Do not drive when taking Pain medications.   Procedures/Studies: [place] EKG SITE RITE  Result Date: 05/10/2022 If Site Rite image not attached, placement could not be confirmed due to current cardiac rhythm.  DG FLUORO GUIDED  NEEDLE PLC  ASPIRATION/INJECTION LOC  Result Date: 05/08/2022 INDICATION: 72 year old female status post right total hip replacement in 2020 presents with septic arthritis of shoulder and right hip pain. Request for fluoro guided right hip aspiration. EXAM: ARTHROCENTESIS OF RIGHT HIP JOINT UNDER FLUOROSCOPIC GUIDANCE COMPARISON:  CT 04/23/2022. FLUOROSCOPY TIME:  1.5 mGy COMPLICATIONS: None immediate. PROCEDURE: Informed written consent was obtained from the patient after discussion of the risks, benefits and alternatives to treatment. The patient was placed supine on the fluoroscopy table and the right extremity was placed in a slight degree of internal rotation. The right hip was localized with fluoroscopy. The skin overlying the anterior aspect of the hip was prepped and draped in usual sterile fashion. A 3.5 inch 18 gauge spinal needle was advanced into the hip joint at the lateral aspect of the femoral head-neck junction, after the overlying soft tissues were anesthetized with 1% lidocaine. Several attempts were made to aspirate the right hip joint but unsuccessful. Dr. Chancy Milroy was called to the procedure room for assistance. Approximately 1.5 mL of synovial fluid was aspirated from the right hip joint insert into the laboratory for analysis. A fluoroscopic image was saved and sent to PACs. The needle was removed and a dressing was placed. The patient tolerated procedure well without immediate postprocedural complication. IMPRESSION: Successful fluoroscopic guided joint aspiration of the right hip. This procedure was performed by Durenda Guthrie, PA-C under the supervision of Maurine Simmering, MD. Electronically Signed   By: Maurine Simmering M.D.   On: 05/08/2022 11:18   MR Shoulder Right W Wo Contrast  Result Date: 05/07/2022 CLINICAL DATA:  Right shoulder pain EXAM: MRI OF THE RIGHT SHOULDER WITHOUT AND WITH CONTRAST TECHNIQUE: Multiplanar, multisequence MR imaging of the radiograph dated April 10, 2018 for shoulder was performed  before and after the administration of intravenous contrast. CONTRAST:  57mL GADAVIST GADOBUTROL 1 MMOL/ML IV SOLN COMPARISON:  None Available. FINDINGS: Rotator cuff: Supraspinatus tendinosis with low-grade partial-thickness (less than 50%) insertional tear at the footprint. Infraspinatus tendon is intact. Teres minor tendon is intact. Subscapularis tendon is intact. Muscles: No muscle atrophy or edema. No intramuscular fluid collection or hematoma. Biceps Long Head: Intraarticular and extraarticular portions of the biceps tendon are intact. Acromioclavicular Joint: Mild arthropathy of the acromioclavicular joint. No subacromial/subdeltoid bursal fluid. Glenohumeral Joint: No joint effusion. Articular cartilage thinning. No full-thickness cartilage defect. Labrum: Degenerative changes of the labrum with a chronic degenerative tear of the anteroinferior labrum. Bones: No fracture or dislocation. No aggressive osseous lesion. Other: No fluid collection or hematoma. IMPRESSION: 1. Supraspinatus tendinosis with low-grade partial-thickness (less than 50%) insertional tear at the footprint. 2. Degenerative changes of the labrum with a chronic degenerative tear of the anteroinferior labrum. 3. Mild acromioclavicular osteoarthritis . 4. No evidence of fracture or dislocation. 5. No joint effusion or evidence of septic arthritis . Electronically Signed   By: Keane Police D.O.   On: 05/07/2022 23:27   MR SHOULDER LEFT W WO CONTRAST  Result Date: 05/07/2022 CLINICAL DATA:  Bilateral shoulder pain, the left shoulder possibly septic. EXAM: MRI OF THE LEFT SHOULDER WITHOUT AND WITH CONTRAST TECHNIQUE: Multiplanar, multisequence MR imaging of the shoulder was performed before and after the administration of intravenous contrast. CONTRAST:  59mL GADAVIST GADOBUTROL 1 MMOL/ML IV SOLN COMPARISON:  Radiograph performed earlier on the same date. FINDINGS: Rotator cuff: Supraspinatus tendinosis with full-thickness partial width tear  measuring a proximally 9 mm in AP dimension. Infraspinatus tendinosis with high-grade partial-thickness tear at the footprint. Teres  minor tendon is intact. Subscapularis tendon is intact. Muscles: There is intramuscular edema with enhancement on the postcontrast sequences highly concerning for infectious/inflammatory process Biceps Long Head: Intraarticular and extraarticular portions of the biceps tendon are intact. Acromioclavicular Joint: Severe arthropathy of the acromioclavicular joint with joint effusion. There is fluid about the superior aspect of the acromioclavicular joint. Glenohumeral Joint: Large shoulder joint effusion with synovial enhancement on post-contrast sequences highly suspicious for septic arthritis. Advanced articular cartilage thinning. No full-thickness cartilage defect Labrum: Degenerative changes of the labrum without evidence of acute tear. Bones: No evidence of osteomyelitis. Bulky glenohumeral and acromioclavicular marginal osteophytes. No fracture or dislocation Other: Small fluid collection about the superior aspect of the acromioclavicular joint. IMPRESSION: 1. Large left shoulder joint effusion with synovial enhancement on post-contrast sequences highly suspicious for septic arthritis. Orthopedic consultation and joint aspiration for further management is suggested. 2. Severe acromioclavicular arthropathy with joint effusion and fluid about the superior aspect of the joint concerning for septic arthritis. 3. Edema of the supraspinatus, infraspinatus and subscapularis muscles, likely reactive secondary to infectious/inflammatory process. 4. No evidence of osteomyelitis. 5. Supraspinatus and infraspinatus tendinosis with full-thickness partial width tear of the supraspinatus and high-grade partial-thickness tear of the infraspinatus. 6. Advanced glenohumeral and acromioclavicular osteoarthritis. Electronically Signed   By: Keane Police D.O.   On: 05/07/2022 23:14   DG Shoulder  Left  Result Date: 05/07/2022 CLINICAL DATA:  Pain EXAM: LEFT SHOULDER - 2+ VIEW COMPARISON:  None Available. FINDINGS: There is no evidence of fracture or dislocation. There is glenohumeral and acromioclavicular joint space narrowing and osteophyte formation compatible with degenerative change. Soft tissues are unremarkable. IMPRESSION: 1. No acute fracture or dislocation. 2. Degenerative changes in the glenohumeral and acromioclavicular joints. Electronically Signed   By: Ronney Asters M.D.   On: 05/07/2022 17:21   NM Bone Scan 3 Phase  Result Date: 05/01/2022 CLINICAL DATA:  Right hip arthroplasty, right hip pain radiating down right leg EXAM: NUCLEAR MEDICINE 3-PHASE BONE SCAN TECHNIQUE: Radionuclide angiographic images, immediate static blood pool images, and 3-hour delayed static images were obtained of the bilateral hips after intravenous injection of radiopharmaceutical. RADIOPHARMACEUTICALS:  21.5 mCi Tc-55m MDP IV COMPARISON:  X-ray 04/19/2022 FINDINGS: Vascular phase: Normal symmetrical radiotracer distribution during the flow phase of the exam. Blood pool phase: Minimal excretion of radiotracer noted within the bladder. Photopenia related to right hip arthroplasty. No abnormal radiotracer deposition. Delayed phase: Minimal excretion of radiotracer within the bladder. Photopenia related to right hip arthroplasty. Minimal low level radiotracer uptake is seen within the femoral diaphysis adjacent to the distal margin of the femoral component of the right hip arthroplasty, likely reactive. No pathologic radiotracer uptake is identified within either hip. IMPRESSION: 1. Postsurgical changes related to right hip arthroplasty. 2. No abnormal radiotracer uptake to suggest prosthesis loosening or infection. Electronically Signed   By: Randa Ngo M.D.   On: 05/01/2022 10:05   CT ABDOMEN PELVIS WO CONTRAST  Result Date: 04/23/2022 CLINICAL DATA:  72 year old female with abdominal pain, nausea vomiting.  EXAM: CT ABDOMEN AND PELVIS WITHOUT CONTRAST TECHNIQUE: Multidetector CT imaging of the abdomen and pelvis was performed following the standard protocol without IV contrast. RADIATION DOSE REDUCTION: This exam was performed according to the departmental dose-optimization program which includes automated exposure control, adjustment of the mA and/or kV according to patient size and/or use of iterative reconstruction technique. COMPARISON:  CTA chest 0328 hours today. CT Abdomen and Pelvis 05/27/2020. FINDINGS: Lower chest: Mildly improved lung volumes from the  CTA chest earlier today (please see that report) no pericardial or pleural effusion. Hepatobiliary: Negative largely noncontrast liver and gallbladder. Pancreas: Negative. Spleen: Negative. Adrenals/Urinary Tract: Normal adrenal glands. Symmetric renal contrast excretion to the collecting systems and ureters. Superimposed chronic and benign appearing renal cysts including parapelvic cysts on the left (no follow-up imaging recommended). Decompressed ureters. Excreted oral contrast throughout the urinary bladder. Stomach/Bowel: Decompressed large bowel from the distal transverse colon on word. Redundant transverse colon with mild retained stool. Negative right colon. Negative appendix series 3, image 51. Negative terminal ileum. No dilated small bowel. Negative stomach and duodenum. No free air or free fluid identified. Vascular/Lymphatic: Extensive Aortoiliac calcified atherosclerosis. Normal caliber abdominal aorta. No residual intravascular contrast to evaluate for patency. No lymphadenopathy identified. Reproductive: Chronically absent uterus, diminutive or absent ovaries. Other: No pelvis free fluid. Musculoskeletal: Diffuse idiopathic skeletal hyperostosis (DISH). Lower thoracic through upper lumbar interbody ankylosis via flowing endplate osteophytes and superimposed lumbosacral posterior and interbody chronic spinal fusion hardware. Bilateral pedicle  screw loosening since 2022 at the L1 and L2 vertebral levels. L2-L3 pseudoarthrosis. L5-S1 pseudoarthrosis and loosening of bilateral S1 and iliac bone screws which were not present in 2022. Right hip arthroplasty.  Left hip degeneration. No superimposed acute osseous abnormality identified. IMPRESSION: 1. No acute or inflammatory process identified in the largely non-contrast abdomen or pelvis. 2. 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 But loosening of the L1, S2, S1, and pelvic bone screws. And pseudoarthrosis at L2-L3 and L5-S1. 3. Advanced  Aortic Atherosclerosis (ICD10-I70.0). Electronically Signed   By: Genevie Ann M.D.   On: 04/23/2022 05:18   CT Angio Chest PE W and/or Wo Contrast  Result Date: 04/23/2022 CLINICAL DATA:  Chest pains. EXAM: CT ANGIOGRAPHY CHEST WITH CONTRAST TECHNIQUE: Multidetector CT imaging of the chest was performed using the standard protocol during bolus administration of intravenous contrast. Multiplanar CT image reconstructions and MIPs were obtained to evaluate the vascular anatomy. RADIATION DOSE REDUCTION: This exam was performed according to the departmental dose-optimization program which includes automated exposure control, adjustment of the mA and/or kV according to patient size and/or use of iterative reconstruction technique. CONTRAST:  77mL OMNIPAQUE IOHEXOL 350 MG/ML SOLN COMPARISON:  CTA head and neck 02/15/2022, portable chest today, portable chest 04/19/2022. FINDINGS: Cardiovascular: Pulmonary arteries are normal caliber without visible embolic filling defects. The heart is slightly enlarged. There is no pericardial effusion. There is three-vessel coronary artery calcification heaviest in the LAD and circumflex arteries. There is moderate atherosclerosis in the aorta and great vessels without aneurysm, stenosis or dissection. Pulmonary veins are normal caliber. Mediastinum/Nodes:  Again, a heterogeneous mass replaces the left lobe of the thyroid displacing the trachea to the right, unchanged in size at 4.3 x 3.4 cm, with dystrophic calcifications at the posterior aspect. Per the ultrasound report of 03/08/2022, biopsy is recommended unless already done. No axillary or intrathoracic adenopathy is seen. The thoracic trachea and thoracic esophagus are unremarkable. Lungs/Pleura: Again noted eventration and moderate elevation of the right hemidiaphragm. There is diffuse bronchial thickening. There is mild centrilobular and paraseptal emphysematous disease in the upper lobes. There is linear scarring or atelectasis in the left lower lobe posterior base. No confluent pulmonary infiltrates or nodules are seen. There is no pleural effusion or pneumothorax. Upper Abdomen: No acute abnormality. 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 No suspicious bone lesions or chest wall abnormality. Review of the MIP images confirms the above findings. IMPRESSION: 1. No  evidence of arterial dilatation or embolus. 2. Aortic and coronary artery atherosclerosis. 3. Mild cardiomegaly. 4. COPD. 5. 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 Per the ultrasound report of 03/08/2022, biopsy is recommended unless already done. 6. Extensive spinal bridging enthesopathy. 7. Elevated right hemidiaphragm. Aortic Atherosclerosis (ICD10-I70.0) and Emphysema (ICD10-J43.9). Electronically Signed   By: Telford Nab M.D.   On: 04/23/2022 04:20   DG Chest Portable 1 View  Result Date: 04/23/2022 CLINICAL DATA:  Chest pain, vomiting EXAM: PORTABLE CHEST 1 VIEW COMPARISON:  04/19/2022 FINDINGS: Minimal left basilar linear scarring. Lungs are otherwise clear. No pneumothorax or pleural effusion. Cardiac size within normal limits. Pulmonary vascularity is normal. No acute bone abnormality. IMPRESSION: No active disease. Electronically Signed   By: Fidela Salisbury M.D.   On: 04/23/2022  00:46   DG Hip Unilat W or Wo Pelvis 1 View Right  Result Date: 04/19/2022 CLINICAL DATA:  pain; hx of replacement EXAM: DG HIP (WITH OR WITHOUT PELVIS) 1V RIGHT COMPARISON:  X-ray pelvis 01/19/2022 FINDINGS: Total right hip arthroplasty. No radiographic findings suggest surgical hardware complication. No acute displaced fracture or dislocation of the left hip on frontal view. At least moderate degenerative changes left hip. There is no evidence of arthropathy or other focal bone abnormality. Lumbo sacroiliac surgical hardware. Vascular calcifications.  Right thigh vascular stents. IMPRESSION: 1. Negative for acute traumatic injury. 2. Total right hip arthroplasty. No radiographic findings suggest surgical hardware complication. Electronically Signed   By: Iven Finn M.D.   On: 04/19/2022 23:52   DG Chest Port 1 View  Result Date: 04/19/2022 CLINICAL DATA:  Upper chest pain. EXAM: PORTABLE CHEST 1 VIEW COMPARISON:  PA Lat 04/21/2020 FINDINGS: The cardiomediastinal silhouette and vasculature are normal except for calcification in the aortic arch. There is left infrahilar linear scarring or atelectasis. The lungs are clear of infiltrates. Mild elevation of the right diaphragm is again seen. The sulci are sharp. There is thoracic spondylosis. IMPRESSION: No evidence of acute cardiopulmonary disease. Left infrahilar linear scarring or atelectasis. Aortic atherosclerosis. Electronically Signed   By: Telford Nab M.D.   On: 04/19/2022 21:00     The results of significant diagnostics from this hospitalization (including imaging, microbiology, ancillary and laboratory) are listed below for reference.     Microbiology: Recent Results (from the past 240 hour(s))  Culture, blood (routine x 2)     Status: None (Preliminary result)   Collection Time: 05/07/22  4:43 PM   Specimen: BLOOD  Result Value Ref Range Status   Specimen Description   Final    BLOOD RIGHT ANTECUBITAL Performed at [facility], [address] [place], [place] [address]    Special Requests   Final    BOTTLES DRAWN AEROBIC AND ANAEROBIC Blood Culture adequate volume Performed at [place] [address], [place], [place] [address]    Culture   Final    NO GROWTH 4 DAYS Performed at [facility], [place] [address] [place], [place] [address]    Report Status PENDING  Incomplete  Culture, blood (routine x 2)     Status: None (Preliminary result)   Collection Time: 05/07/22  5:01 PM   Specimen: BLOOD  Result Value Ref Range Status   Specimen Description   Final    BLOOD LEFT ANTECUBITAL Performed at [facility], [place] [address], [place], [place] [address]    Special Requests   Final    BOTTLES  DRAWN AEROBIC AND ANAEROBIC Blood Culture adequate volume Performed at [place] [address], [place], [place] [address]    Culture   Final    NO GROWTH 4 DAYS Performed at [facility], [place] [address] [place], [place] [address]    Report Status PENDING  Incomplete  Surgical pcr screen     Status: None   Collection Time: 05/08/22  8:51 AM   Specimen: Nasal Mucosa; Nasal Swab  Result Value Ref Range Status   MRSA, PCR NEGATIVE NEGATIVE Final   Staphylococcus aureus NEGATIVE NEGATIVE Final    Comment: (NOTE) The Xpert SA Assay (FDA approved for NASAL specimens in patients 44 years of age and older), is one component of a comprehensive surveillance program. It is not intended to diagnose infection nor to guide or monitor treatment. Performed at [facility], [place] [address], [place], [place] [address]   Aerobic/Anaerobic Culture w Gram Stain (surgical/deep wound)     Status: None (Preliminary result)   Collection Time: 05/08/22 10:56 AM   Specimen: Joint, Right Hip; Synovial Fluid  Result Value Ref Range Status   Specimen Description   Final    SYNOVIAL Performed at [place] [address],  [place], [place] [address]    Special Requests   Final    RIGHT HIP Performed at [place] [address] [place], [place] [address]    Gram Stain NO WBC SEEN NO ORGANISMS SEEN   Final   Culture   Final    NO GROWTH 3 DAYS NO ANAEROBES ISOLATED; CULTURE IN PROGRESS FOR 5 DAYS Performed at [place] [address] [place], [place] [address]    Report Status PENDING  Incomplete  Aerobic/Anaerobic Culture w Gram Stain (surgical/deep wound)     Status: None (Preliminary result)   Collection Time: 05/08/22  1:50 PM   Specimen: Joint, Other; Body Fluid  Result Value Ref Range Status   Specimen Description   Final    FLUID Performed at [place] [address], [place], [place] [address]    Special Requests   Final    JOINT Performed at [facility], [place] [address] [place], [place] [address]    Gram Stain   Final    ABUNDANT WBC PRESENT, PREDOMINANTLY PMN NO ORGANISMS SEEN Performed at [facility], [place] [address] [place],  [address]    Culture   Final    RARE STAPHYLOCOCCUS AUREUS SUSCEPTIBILITIES TO FOLLOW NO ANAEROBES ISOLATED; CULTURE IN PROGRESS FOR 5 DAYS    Report Status PENDING  Incomplete     Labs: BNP (last 3 results) No results for input(s): "BNP" in the last 8760 hours. Basic Metabolic Panel: Recent Labs  Lab 05/07/22 1643 05/08/22 0106 05/10/22 0506 05/11/22 0548  NA 134* 135 134* 135  K 3.6 3.6 3.8 4.0  CL 101 105 104 104  CO2 25 24 23 23   GLUCOSE 200* 170* 211* 167*  BUN 9 10 18 17   CREATININE 0.88 0.84 0.89 0.76  CALCIUM 8.0* 7.9* 7.7* 7.7*  MG  --   --  1.8 1.7   Liver Function Tests: Recent Labs  Lab 05/07/22 1643  AST 16  ALT 13  ALKPHOS 123  BILITOT 0.4  PROT 8.5*  ALBUMIN 2.3*   No results for input(s): "LIPASE", "AMYLASE" in the last 168 hours. No results for input(s): "AMMONIA" in the last 168 hours. CBC: Recent  Labs  Lab 05/07/22 1643  05/08/22 0106 05/10/22 0506 05/11/22 0548  WBC 9.8 8.8 13.9* 9.3  NEUTROABS 6.8  --   --   --   HGB 9.0* 8.1* 7.9* 7.6*  HCT 29.7* 26.4* 25.3* 24.3*  MCV 85.8 85.2 83.2 84.1  PLT 427* 382 378 343   Cardiac Enzymes: Recent Labs  Lab 05/10/22 0506  CKTOTAL 116   BNP: Invalid input(s): "POCBNP" CBG: Recent Labs  Lab 05/10/22 2001 05/11/22 0004 05/11/22 0422 05/11/22 0836 05/11/22 1119  GLUCAP 218* 114* 105* 122* 240*   D-Dimer No results for input(s): "DDIMER" in the last 72 hours. Hgb A1c No results for input(s): "HGBA1C" in the last 72 hours. Lipid Profile No results for input(s): "CHOL", "HDL", "LDLCALC", "TRIG", "CHOLHDL", "LDLDIRECT" in the last 72 hours. Thyroid function studies No results for input(s): "TSH", "T4TOTAL", "T3FREE", "THYROIDAB" in the last 72 hours.  Invalid input(s): "FREET3" Anemia work up No results for input(s): "VITAMINB12", "FOLATE", "FERRITIN", "TIBC", "IRON", "RETICCTPCT" in the last 72 hours. Urinalysis    Component Value Date/Time   COLORURINE YELLOW 04/23/2022 0207   APPEARANCEUR HAZY (A) 04/23/2022 0207   LABSPEC 1.011 04/23/2022 0207   PHURINE 7.0 04/23/2022 0207   GLUCOSEU 50 (A) 04/23/2022 0207   HGBUR SMALL (A) 04/23/2022 0207   BILIRUBINUR NEGATIVE 04/23/2022 0207   KETONESUR NEGATIVE 04/23/2022 0207   PROTEINUR 30 (A) 04/23/2022 0207   UROBILINOGEN 0.2 09/21/2011 1601   NITRITE NEGATIVE 04/23/2022 0207   LEUKOCYTESUR NEGATIVE 04/23/2022 0207   Sepsis Labs Recent Labs  Lab 05/07/22 1643 05/08/22 0106 05/10/22 0506 05/11/22 0548  WBC 9.8 8.8 13.9* 9.3   Microbiology Recent Results (from the past 240 hour(s))  Culture, blood (routine x 2)     Status: None (Preliminary result)   Collection Time: 05/07/22  4:43 PM   Specimen: BLOOD  Result Value Ref Range Status   Specimen Description   Final    BLOOD RIGHT ANTECUBITAL Performed at [facility], [place] [address], [place], [place] [address]    Special  Requests   Final    BOTTLES DRAWN AEROBIC AND ANAEROBIC Blood Culture adequate volume Performed at [place] [address], [place], [place] [address]    Culture   Final    NO GROWTH 4 DAYS Performed at [facility], [place] [address], [place], [place] [address]    Report Status PENDING  Incomplete  Culture, blood (routine x 2)     Status: None (Preliminary result)   Collection Time: 05/07/22  5:01 PM   Specimen: BLOOD  Result Value Ref Range Status   Specimen Description   Final    BLOOD LEFT ANTECUBITAL Performed at [facility], [place] [address], [place], [place] [address]    Special Requests   Final    BOTTLES DRAWN AEROBIC AND ANAEROBIC Blood Culture adequate volume Performed at [place] [address], [place], [place] [address]    Culture   Final    NO GROWTH 4 DAYS Performed at [facility], [place] [address] [place], [place] [address]    Report Status PENDING  Incomplete  Surgical pcr screen     Status: None   Collection Time: 05/08/22  8:51 AM   Specimen: Nasal Mucosa; Nasal Swab  Result Value Ref Range Status   MRSA, PCR NEGATIVE NEGATIVE Final   Staphylococcus aureus NEGATIVE NEGATIVE Final    Comment: (NOTE) The Xpert SA Assay (FDA approved for  NASAL specimens in patients 28 years of age and older), is one component of a comprehensive surveillance program. It is not intended to diagnose infection nor to guide or monitor treatment. Performed at [facility], [place] [address] [place], [place] [address]   Aerobic/Anaerobic Culture w Gram Stain (surgical/deep wound)     Status: None (Preliminary result)   Collection Time: 05/08/22 10:56 AM   Specimen: Joint, Right Hip; Synovial Fluid  Result Value Ref Range Status   Specimen Description   Final    SYNOVIAL Performed at [place] [address] [place], [place] [address]    Special Requests   Final    RIGHT  HIP Performed at [place] [address] [place], [place] [address]    Gram Stain NO WBC SEEN NO ORGANISMS SEEN   Final   Culture   Final    NO GROWTH 3 DAYS NO ANAEROBES ISOLATED; CULTURE IN PROGRESS FOR 5 DAYS Performed at [place] [address], [place], [place] [address]    Report Status PENDING  Incomplete  Aerobic/Anaerobic Culture w Gram Stain (surgical/deep wound)     Status: None (Preliminary result)   Collection Time: 05/08/22  1:50 PM   Specimen: Joint, Other; Body Fluid  Result Value Ref Range Status   Specimen Description   Final    FLUID Performed at [place] [address], [place], [place] [address]    Special Requests   Final    JOINT Performed at [facility], [place] [address], [place], [place] [address]    Gram Stain   Final    ABUNDANT WBC PRESENT, PREDOMINANTLY PMN NO ORGANISMS SEEN Performed at [facility], [place] [address] [place],  [address]    Culture   Final    RARE STAPHYLOCOCCUS AUREUS SUSCEPTIBILITIES TO FOLLOW NO ANAEROBES ISOLATED; CULTURE IN PROGRESS FOR 5 DAYS    Report Status PENDING  Incomplete     Time coordinating discharge:  I have spent 35 minutes face to face with the patient and on the ward discussing the patients care, assessment, plan and disposition with other care givers. >50% of the time was devoted counseling the patient about the risks and benefits of treatment/Discharge disposition and coordinating care.   SIGNED:   Damita Lack, MD  [facility] 05/11/2022, 12:51 PM   If 7PM-7AM, please contact night-coverage

## 2022-05-11 NOTE — Progress Notes (Signed)
Reviewed discharge instructions with patient including medications and follow up appointments. Pt verbalized understanding. Answered all questions regarding discharge instructions.  Elner Seifert, Laurel Dimmer, RN

## 2022-05-12 LAB — CULTURE, BLOOD (ROUTINE X 2)
Culture: NO GROWTH
Culture: NO GROWTH
Special Requests: ADEQUATE
Special Requests: ADEQUATE

## 2022-05-13 LAB — AEROBIC/ANAEROBIC CULTURE W GRAM STAIN (SURGICAL/DEEP WOUND)
Culture: NO GROWTH
Gram Stain: NONE SEEN

## 2022-05-14 ENCOUNTER — Other Ambulatory Visit: Payer: Self-pay

## 2022-05-14 ENCOUNTER — Telehealth: Payer: Self-pay

## 2022-05-14 NOTE — Telephone Encounter (Signed)
Called pt to sched [facility], no answer and VM box has not been set up. Will attempt to call a little later for scheduling.

## 2022-05-18 LAB — AEROBIC/ANAEROBIC CULTURE W GRAM STAIN (SURGICAL/DEEP WOUND)

## 2022-05-18 LAB — MIC RESULT

## 2022-05-18 LAB — MINIMUM INHIBITORY CONC. (1 DRUG)

## 2022-05-21 NOTE — Progress Notes (Addendum)
     Results for left shoulder aspirate done in office prior to surgery on 05/04/22        Results for blood work done in office 05/04/22 looking at inflammatory markers        Britt Bottom, PA-C Office [phone] 05/21/2022, 5:26 PM

## 2022-05-24 LAB — LAB REPORT - SCANNED: EGFR: 69

## 2022-05-29 LAB — LAB REPORT - SCANNED: EGFR: 69

## 2022-06-01 ENCOUNTER — Telehealth: Payer: Self-pay

## 2022-06-01 ENCOUNTER — Other Ambulatory Visit: Payer: Self-pay

## 2022-06-01 ENCOUNTER — Encounter: Payer: Self-pay | Admitting: Internal Medicine

## 2022-06-01 ENCOUNTER — Ambulatory Visit (INDEPENDENT_AMBULATORY_CARE_PROVIDER_SITE_OTHER): Payer: Medicare HMO | Admitting: Internal Medicine

## 2022-06-01 VITALS — BP 155/84 | HR 83 | Temp 97.3°F | Resp 16 | Wt 132.2 lb

## 2022-06-01 DIAGNOSIS — Z452 Encounter for adjustment and management of vascular access device: Secondary | ICD-10-CM | POA: Diagnosis not present

## 2022-06-01 DIAGNOSIS — M00019 Staphylococcal arthritis, unspecified shoulder: Secondary | ICD-10-CM | POA: Diagnosis not present

## 2022-06-01 DIAGNOSIS — Z5181 Encounter for therapeutic drug level monitoring: Secondary | ICD-10-CM

## 2022-06-01 NOTE — Assessment & Plan Note (Signed)
Shoulder seems to be improving and no new concerns.  She is on appropriate treatment and no changes indicated.  Tolerating well. Continue with daptomcyin through April 23rd and can stop then.  Follow up after treatment completion for follow up exam, ESR, CRP

## 2022-06-01 NOTE — Assessment & Plan Note (Addendum)
Labs reviewed with her and no concerns.   CK wnl

## 2022-06-01 NOTE — Telephone Encounter (Signed)
Message sent to Jeri Modena, RN and [employer] staff. Per Dr.Comer - Pull PICC line after last dose of IV Daptomycin on 06/19/2022. [facility] pharmacists team notified as well.    Zyad Boomer Lesli Albee, CMA

## 2022-06-01 NOTE — Progress Notes (Signed)
   Subjective:    Patient ID: April Shaffer, female    DOB: [birth year], 72 y.o.   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 She developed right then left shoulder pain and MRI c/w left septic arthritis with surgical management by Dr. Eulah Pont 05/08/22 and growth with MRSA.  She has been on daptomycin planned through April 23rd and here for follow up.  No new issues including no rash, no diarrhea, no myalgias.  No issues with the picc line.    Review of Systems  Constitutional:  Negative for fatigue.  Gastrointestinal:  Negative for diarrhea and nausea.  Musculoskeletal:  Negative for myalgias.  Skin:  Negative for rash.       Objective:   Physical Exam Eyes:     General: No scleral icterus. Pulmonary:     Effort: Pulmonary effort is normal.  Skin:    Findings: No rash.  Neurological:     General: No focal deficit present.     Mental Status: She is alert.   SH: no tobacco        Assessment & Plan:

## 2022-06-01 NOTE — Assessment & Plan Note (Signed)
PICC line is working well, no concerns and ok to remove the picc line after her last dose April 23.

## 2022-06-14 NOTE — Telephone Encounter (Signed)
Received call from [place], [facility] with [facility] wanting to confirm end date and pull PICC orders. Advised that orders were given to stop daptomycin 06/19/22 and pull PICC after last dose.   Sandie Ano, RN

## 2022-06-15 ENCOUNTER — Ambulatory Visit (INDEPENDENT_AMBULATORY_CARE_PROVIDER_SITE_OTHER): Payer: Medicare HMO | Admitting: Podiatry

## 2022-06-15 DIAGNOSIS — M79674 Pain in right toe(s): Secondary | ICD-10-CM

## 2022-06-15 DIAGNOSIS — M79675 Pain in left toe(s): Secondary | ICD-10-CM | POA: Diagnosis not present

## 2022-06-15 DIAGNOSIS — B351 Tinea unguium: Secondary | ICD-10-CM | POA: Diagnosis not present

## 2022-06-15 DIAGNOSIS — E1142 Type 2 diabetes mellitus with diabetic polyneuropathy: Secondary | ICD-10-CM

## 2022-06-15 DIAGNOSIS — I739 Peripheral vascular disease, unspecified: Secondary | ICD-10-CM

## 2022-06-15 NOTE — Progress Notes (Unsigned)
  Subjective:  Patient ID: April Shaffer, female    DOB: 29-Dec-1950,  MRN: [id number]  April Shaffer presents to clinic today for {jgcomplaint:23593}  Chief Complaint  Patient presents with   Nail Problem    [facility] BS-did not check today A1C-5.6? April Shaffer PCP VST-02/2022   New problem(s): None. {jgcomplaint:23593}  PCP is April Mutton., FNP.  Allergies  Allergen Reactions   Zestoretic [Lisinopril-Hydrochlorothiazide] Swelling    Angioedema  - Tongue swelling    Codeine Itching   Oxycodone Itching    Patient is taking at this time   Ultram [Tramadol] Itching   Zocor [Simvastatin] Other (See Comments)    Muscle pain    Review of Systems: Negative except as noted in the HPI.  Objective: No changes noted in today's physical examination. There were no vitals filed for this visit. April Shaffer is a pleasant 72 y.o. female {jgbodyhabitus:24098} AAO x 3.  Vascular Examination: CFT <4 seconds b/l. DP/PT pulses diminished RLE. DP/PT faint LLE. Digital hair absent. Skin temperature gradient warm to cool b/l. No ischemia or gangrene. No cyanosis or clubbing noted b/l. No edema noted b/l LE.   Neurological Examination: Protective sensation decreased with 10 gram monofilament b/l. Vibratory sensation decreased b/l.  Dermatological Examination: Pedal skin thin, shiny and atrophic b/l. No open wounds. No interdigital macerations. Toenails 1-5 b/l thick, discolored, elongated with subungual debris and pain on dorsal palpation.  Subungual longitudinal melanonychia noted left hallux nail and left 2nd digit. No extension into proximal nailfold.  Musculoskeletal Examination: Muscle strength 5/5 to all lower extremity muscle groups bilaterally. No pain, crepitus or joint limitation noted with ROM bilateral LE. No gross bony deformities bilaterally. Utilizes cane for ambulation assistance.  Radiographs: None  Assessment/Plan: No diagnosis found.  No orders of the defined  types were placed in this encounter.   None {Jgplan:23602::"-Patient/POA to call should there be question/concern in the interim."}   Return in about 3 months (around 09/14/2022).  Freddie Breech, DPM

## 2022-06-17 ENCOUNTER — Encounter: Payer: Self-pay | Admitting: Podiatry

## 2022-07-04 ENCOUNTER — Ambulatory Visit (HOSPITAL_COMMUNITY)
Admission: RE | Admit: 2022-07-04 | Discharge: 2022-07-04 | Disposition: A | Discharge status: Home / Self Care | Payer: Medicare HMO | Source: Ambulatory Visit | Attending: Vascular Surgery | Admitting: Vascular Surgery

## 2022-07-04 ENCOUNTER — Ambulatory Visit (INDEPENDENT_AMBULATORY_CARE_PROVIDER_SITE_OTHER): Payer: Medicare HMO | Admitting: Physician Assistant

## 2022-07-04 ENCOUNTER — Ambulatory Visit (INDEPENDENT_AMBULATORY_CARE_PROVIDER_SITE_OTHER)
Admission: RE | Admit: 2022-07-04 | Discharge: 2022-07-04 | Disposition: A | Discharge status: Home / Self Care | Payer: Medicare HMO | Source: Ambulatory Visit | Attending: Vascular Surgery | Admitting: Vascular Surgery

## 2022-07-04 VITALS — BP 136/80 | HR 75 | Temp 98.0°F | Wt 131.0 lb

## 2022-07-04 DIAGNOSIS — I739 Peripheral vascular disease, unspecified: Secondary | ICD-10-CM

## 2022-07-04 DIAGNOSIS — I6523 Occlusion and stenosis of bilateral carotid arteries: Secondary | ICD-10-CM | POA: Diagnosis not present

## 2022-07-04 LAB — VAS US ABI WITH/WO TBI
Left ABI: 0.87
Right ABI: 0.9

## 2022-07-04 NOTE — Progress Notes (Signed)
Office Note     CC:  follow up Requesting Provider:  Raymon Mutton., FNP  HPI: April Shaffer is a 72 y.o. (January 19, 1951) female who presents for routine follow up of carotid artery stenosis and PAD. She has history of right SFA stenting for rest pain. This was performed in June of 2023 by Dr. Randie Heinz with subsequent in stent restenosis requiring repeat intervention in August of 2023. She since has not had recurrent symptoms. She has remained on Aspirin, Statin and Plavix.   She has remote history of right CEA by Dr. Hart Rochester on 09/27/11. Her right carotid occluded silently after CEA. We have been following her left carotid artery by duplex. She has high grade asymptomatic stenosis. She was scheduled for left TCAR on 04/17/22 but on her pre op workup she was found to have leukocytosis and subsequently was diagnosed with infected left shoulder. She underwent Arthroscopy, I&D, distal clavicle excision, subacromial decompression, loose body removal and extensive debridement by Dr. Eulah Pont on 05/08/22.  Today she reports that her shoulder is feeling much better. She had to have PICC line for several weeks of antibiotics which are now complete.She has her last PT appointment this week. She denies any pain in her legs on ambulation or rest. No tissue loss. She denies any amaurosis fugax, slurred speech, facial drooping, unilateral upper or lower extremity weakness or numbness. She remains on her Aspirin, Statin, and Plavix  Past Medical History:  Diagnosis Date   Anemia    Arthritis    Blood transfusion without reported diagnosis    Carotid artery narrowing    Coronary artery disease    Cardiac catheterization November 2013: 50% ostial LAD stenosis 50% mid stenosis. 30% disease in the left circumflex.   Diabetes mellitus, type 2 (HCC)    Hyperlipidemia    Hypertension    Onychomycosis of toenail 07/31/2016   PAD (peripheral artery disease) (HCC)    Sleep apnea       Had surgery to correct   Thyroid  nodule     Past Surgical History:  Procedure Laterality Date   ABDOMINAL AORTOGRAM W/LOWER EXTREMITY N/A 08/07/2021   Procedure: ABDOMINAL AORTOGRAM W/LOWER EXTREMITY;  Surgeon: Maeola Harman, MD;  Location: [facility] INVASIVE CV LAB;  Service: Cardiovascular;  Laterality: N/A;   ABDOMINAL AORTOGRAM W/LOWER EXTREMITY Right 10/23/2021   Procedure: ABDOMINAL AORTOGRAM W/LOWER EXTREMITY;  Surgeon: Maeola Harman, MD;  Location: [facility] INVASIVE CV LAB;  Service: Cardiovascular;  Laterality: Right;   ABDOMINAL HYSTERECTOMY     APPLICATION OF INTRAOPERATIVE CT SCAN N/A 11/30/2020   Procedure: APPLICATION OF INTRAOPERATIVE CT SCAN;  Surgeon: Tia Alert, MD;  Location: [facility] OR;  Service: Neurosurgery;  Laterality: N/A;   BACK SURGERY     CARDIAC CATHETERIZATION     CATARACT EXTRACTION, BILATERAL Bilateral 2021   Dr. Joseph Art   ENDARTERECTOMY  09/27/2011   Procedure: RIGT ENDARTERECTOMY CAROTID;  Surgeon: Pryor Ochoa, MD;  Location: [facility] OR;  Service: Vascular;  Laterality: Right;   EPIDURAL BLOCK INJECTION  02/2008   Drs. Dorthula Nettles   gyn surgery  2004   total hysterectomy for mennorhagia,,salpingoophorectomy   INCISION AND DRAINAGE ABSCESS Left 05/08/2022   Procedure: ARTHROSCOPIC INCISION AND DRAINAGE ABSCESS, DISTAL CLAVICLE EXCISSION, SUBACROMIAL DECOMPRESSION, LOOSE BODY REMOVAL, EXTENSIVE DEBRIDEMENT;  Surgeon: Sheral Apley, MD;  Location: [facility] ORS;  Service: Orthopedics;  Laterality: Left;   LAMINECTOMY WITH POSTERIOR LATERAL ARTHRODESIS LEVEL 2 N/A 11/30/2020   Procedure: LUMBAR FOUR-FIVE, LUMBAR FIVE-SACRAL ONE POSTERIOR  LATERAL FUSION WITH REVISION OF LUMBAR ONE-FIVE HARDWARE AND EXTENSION TO SACRAL ONE AND SACRAL TWO;  Surgeon: Tia Alert, MD;  Location: [facility] OR;  Service: Neurosurgery;  Laterality: N/A;   LUMBAR WOUND DEBRIDEMENT N/A 05/25/2020   Procedure: LUMBAR WOUND IRRIGATION AND DEBRIDEMENT;  Surgeon: Tia Alert, MD;  Location: [facility] OR;  Service:  Neurosurgery;  Laterality: N/A;   PERIPHERAL VASCULAR INTERVENTION  08/07/2021   Procedure: PERIPHERAL VASCULAR INTERVENTION;  Surgeon: Maeola Harman, MD;  Location: [facility] INVASIVE CV LAB;  Service: Cardiovascular;;  Rt SFA   PERIPHERAL VASCULAR INTERVENTION Right 10/23/2021   Procedure: PERIPHERAL VASCULAR INTERVENTION;  Surgeon: Maeola Harman, MD;  Location: [facility] INVASIVE CV LAB;  Service: Cardiovascular;  Laterality: Right;  SFA   POSTERIOR LUMBAR FUSION 4 LEVEL N/A 04/25/2020   Procedure: POSTERIOR LUMBAR INTERBODY FUSION LUMBAR ONE-TWO, LUMBAR TWO-THREE, LUMBAR THREE-FOUR,LUMBAR FOUR-FIVE.;  Surgeon: Tia Alert, MD;  Location: [facility] OR;  Service: Neurosurgery;  Laterality: N/A;  posterior   SHOULDER ARTHROSCOPY Left 05/08/2022   Procedure: ARTHROSCOPY SHOULDER;  Surgeon: Sheral Apley, MD;  Location: [facility] ORS;  Service: Orthopedics;  Laterality: Left;   TONSILLECTOMY     TOTAL ABDOMINAL HYSTERECTOMY W/ BILATERAL SALPINGOOPHORECTOMY Bilateral 2000   TOTAL HIP ARTHROPLASTY Right 08/22/2018   Procedure: TOTAL HIP ARTHROPLASTY;  Surgeon: Frederico Hamman, MD;  Location: [facility] ORS;  Service: Orthopedics;  Laterality: Right;   VARICOSE VEIN SURGERY  2008   stripping    Social History   Socioeconomic History   Marital status: Significant Other    Spouse name: Not on file   Number of children: 2   Years of education: 33   Highest education level: Not on file  Occupational History   Occupation: housekeeping-retired; 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    Comment: [employer]-retired 07/23/2014  Tobacco Use   Smoking status: Every Day    Packs/day: 0.25    Years: 50.00    Additional pack years: 0.00    Total pack years: 12.50    Types: Cigarettes    Start date: 05/31/1968    Passive exposure: Never   Smokeless tobacco: Never   Tobacco comments:    Smoked x50 years, 1 pack would last 3 days. Currently smoking 4-5 cigarettes per day  Vaping Use   Vaping Use:  Never used  Substance and Sexual Activity   Alcohol use: Yes    Alcohol/week: 1.0 standard drink of alcohol    Types: 1 Cans of beer per week    Comment: 1-2 drinks every other day   Drug use: No   Sexual activity: Not on file  Other Topics Concern   Not on file  Social History Narrative   Lives with long term boyfriend (30 years together)   Both sons live in [place]   Social Determinants of Health   Financial Resource Strain: Low Risk  (08/06/2018)   Overall Financial Resource Strain (CARDIA)    Difficulty of Paying Living Expenses: Not hard at all  Food Insecurity: No Food Insecurity (05/08/2022)   Hunger Vital Sign    Worried About Running Out of Food in the Last Year: Never true    Ran Out of Food in the Last Year: Never true  Transportation Needs: No Transportation Needs (05/08/2022)   PRAPARE - Administrator, Civil Service (Medical): No    Lack of Transportation (Non-Medical): No  Physical Activity: Inactive (06/17/2017)   Exercise Vital Sign    Days of Exercise per  Week: 0 days    Minutes of Exercise per Session: 0 min  Stress: No Stress Concern Present (06/17/2017)   [employer] of Occupational Health - Occupational Stress Questionnaire    Feeling of Stress : Not at all  Social Connections: Unknown (08/06/2018)   Social Connection and Isolation Panel [NHANES]    Frequency of Communication with Friends and Family: Not on file    Frequency of Social Gatherings with Friends and Family: Not on file    Attends Religious Services: Not on file    Active Member of Clubs or Organizations: Not on file    Attends Banker Meetings: Not on file    Marital Status: Living with partner  Intimate Partner Violence: Not At Risk (05/08/2022)   Humiliation, Afraid, Rape, and Kick questionnaire    Fear of Current or Ex-Partner: No    Emotionally Abused: No    Physically Abused: No    Sexually Abused: No    Family History  Problem Relation Age of Onset    Hypertension Sister    Hypothyroidism Sister    Cancer Maternal Grandmother        lung   Hypertension Son    Hypertension Sister    Diabetes Brother        lost toe in 2018 with new diagnosis of DM   Hypertension Son     Current Outpatient Medications  Medication Sig Dispense Refill   acetaminophen (TYLENOL) 500 MG tablet Take 500 mg by mouth 2 (two) times daily as needed for moderate pain.     allopurinol (ZYLOPRIM) 300 MG tablet Take 1 tablet (300 mg total) by mouth daily. (Patient taking differently: Take 300 mg by mouth daily as needed (gout).) 90 tablet 3   amLODipine (NORVASC) 10 MG tablet Take 10 mg by mouth in the morning.     aspirin EC 81 MG tablet Take 81 mg by mouth in the morning.     atorvastatin (LIPITOR) 40 MG tablet Take 1 tablet (40 mg total) by mouth daily. 30 tablet 0   carvedilol (COREG CR) 20 MG 24 hr capsule Take 1 capsule (20 mg total) by mouth daily. 90 capsule 3   clopidogrel (PLAVIX) 75 MG tablet Take 75 mg by mouth in the morning.     cyanocobalamin (VITAMIN B12) 1000 MCG tablet Take 1,000 mcg by mouth daily.     ezetimibe (ZETIA) 10 MG tablet TAKE 1 TABLET EVERY DAY (NEED MD APPOINTMENT) (Patient taking differently: Take 10 mg by mouth daily.) 90 tablet 0   ferrous sulfate 325 (65 FE) MG tablet Take 325 mg by mouth daily.     gabapentin (NEURONTIN) 300 MG capsule Take 300 mg by mouth 3 (three) times daily.     glucose blood test strip Check sugars twice daily 100 each 11   metFORMIN (GLUCOPHAGE) 500 MG tablet Take 500 mg by mouth in the morning and at bedtime.     oxyCODONE-acetaminophen (PERCOCET) 10-325 MG tablet Take 1 tablet by mouth every 4 (four) hours as needed for pain. (Patient taking differently: Take 1 tablet by mouth every 6 (six) hours as needed for pain.) 40 tablet 0   No current facility-administered medications for this visit.    Allergies  Allergen Reactions   Zestoretic [Lisinopril-Hydrochlorothiazide] Swelling    Angioedema  -  Tongue swelling    Codeine Itching   Oxycodone Itching    Patient is taking at this time   Ultram [Tramadol] Itching   Zocor [Simvastatin] Other (See  Comments)    Muscle pain     REVIEW OF SYSTEMS:  [X]  denotes positive finding, [ ]  denotes negative finding Cardiac  Comments:  Chest pain or chest pressure:    Shortness of breath upon exertion:    Short of breath when lying flat:    Irregular heart rhythm:        Vascular    Pain in calf, thigh, or hip brought on by ambulation:    Pain in feet at night that wakes you up from your sleep:     Blood clot in your veins:    Leg swelling:         Pulmonary    Oxygen at home:    Productive cough:     Wheezing:         Neurologic    Sudden weakness in arms or legs:     Sudden numbness in arms or legs:     Sudden onset of difficulty speaking or slurred speech:    Temporary loss of vision in one eye:     Problems with dizziness:         Gastrointestinal    Blood in stool:     Vomited blood:         Genitourinary    Burning when urinating:     Blood in urine:        Psychiatric    Major depression:         Hematologic    Bleeding problems:    Problems with blood clotting too easily:        Skin    Rashes or ulcers:        Constitutional    Fever or chills:      PHYSICAL EXAMINATION:  Vitals:   07/04/22 1323  BP: 136/80  Pulse: 75  Temp: 98 F (36.7 C)  TempSrc: Temporal  SpO2: 100%  Weight: 131 lb (59.4 kg)    General:  WDWN in NAD; vital signs documented above Gait: Ambulates with cane HENT: WNL, normocephalic Pulmonary: normal non-labored breathing , without wheezing Cardiac: regular HR Abdomen: soft, NT, no masses Vascular Exam/Pulses: 2+ femoral pulses, 2+ right DP pulse, no palpable DP pulse on left. Feet warm and well perfused Extremities: without ischemic changes, without Gangrene , without cellulitis; without open wounds Musculoskeletal: no muscle wasting or atrophy  Neurologic: A&O X  3 Psychiatric:  The pt has Normal affect.   Non-Invasive Vascular Imaging:   +-------+-----------+-----------+------------+------------+  ABI/TBIToday's ABIToday's TBIPrevious ABIPrevious TBI  +-------+-----------+-----------+------------+------------+  Right 0.90       0.73       1.05        0.74          +-------+-----------+-----------+------------+------------+  Left  0.87       0.52       1.04        0.65          +-------+-----------+-----------+------------+------------+  Biphasic flow on right with toe pressure of 113 mmHg Monophasic flow on left with toe pressure of 81 mmHg  VAS [place] Lower extremity Arterial duplex: Summary:  Right: Patent stents with no evidence of stenosis in the proximal and distal superficial femoral artery artery  ASSESSMENT/PLAN:: 72 y.o. female here for follow up for carotid artery stenosis and PAD. She has history of right SFA stenting for rest pain. This was performed in June of 2023 by Dr. Randie Heinz with subsequent in stent restenosis requiring repeat intervention in August of 2023. She since has  not had recurrent symptoms. Her ABI today shows decrease bilaterally. Her right SFA stent is patent. She however is without any associated symptoms so no intervention is indicated at this time.  - Continue Aspirin, Statin, Plavix - She is neurologically intact. Will reschedule her left TCAR in the near future with Dr. Randie Heinz - She knows should she develop any TIA/ Stroke like symptoms to call 911 or go to ER   Graceann Congress, PA-C [facility] [phone]  Clinic MD:   Caesar Bookman

## 2022-07-05 ENCOUNTER — Ambulatory Visit (INDEPENDENT_AMBULATORY_CARE_PROVIDER_SITE_OTHER): Payer: Medicare HMO | Admitting: Internal Medicine

## 2022-07-05 ENCOUNTER — Telehealth: Payer: Self-pay | Admitting: *Deleted

## 2022-07-05 ENCOUNTER — Other Ambulatory Visit: Payer: Self-pay

## 2022-07-05 ENCOUNTER — Encounter: Payer: Self-pay | Admitting: Internal Medicine

## 2022-07-05 VITALS — BP 130/78 | HR 74 | Temp 97.7°F | Ht 63.0 in | Wt 132.0 lb

## 2022-07-05 DIAGNOSIS — I6522 Occlusion and stenosis of left carotid artery: Secondary | ICD-10-CM

## 2022-07-05 DIAGNOSIS — M00012 Staphylococcal arthritis, left shoulder: Secondary | ICD-10-CM | POA: Diagnosis not present

## 2022-07-05 NOTE — Telephone Encounter (Signed)
   [facility] Pre-operative Risk Assessment    Patient Name: April Shaffer  DOB: 1950-10-30 MRN: [id number]  HEARTCARE STAFF:  - IMPORTANT!!!!!! Under Visit Info/Reason for Call, type in Other and utilize the format Clearance MM/DD/YY or Clearance TBD. Do not use dashes or single digits. - Please review there is not already an duplicate clearance open for this procedure. - If request is for dental extraction, please clarify the # of teeth to be extracted. - If the patient is currently at the dentist's office, call Pre-Op Callback Staff (MA/nurse) to input urgent request.  - If the patient is not currently in the dentist office, please route to the Pre-Op pool.  Request for surgical clearance:  What type of surgery is being performed? Left transcarotid artery revascularization  When is this surgery scheduled? 08/07/2022  What type of clearance is required (medical clearance vs. Pharmacy clearance to hold med vs. Both)? Both   Are there any medications that need to be held prior to surgery and how long? Per Dr Randie Heinz to continue Jonne Ply and Plavix-does not need to be held  Practice name and name of physician performing surgery?[facility], Dr Lemar Livings  What is the office phone number? [phone]   7.   What is the office fax number? [phone]  8.   Anesthesia type (None, local, MAC, general) ? general   Valrie Hart 07/05/2022, 4:26 PM  _________________________________________________________________   (provider comments below)

## 2022-07-05 NOTE — Assessment & Plan Note (Signed)
She continues to do well now off antibiotics and no new concerns.  At this point, no further indication for antibiotics and will remain off.   I encouraged continued activity and good nutrition  She otherwise can follow up as needed  I have personally spent 30 minutes involved in face-to-face and non-face-to-face activities for this patient on the day of the visit. Professional time spent includes the following activities: Preparing to see the patient (review of tests), Obtaining and/or reviewing separately obtained history (admission/discharge record), Performing a medically appropriate examination and/or evaluation , Ordering medications/tests/procedures, referring and communicating with other health care professionals, Documenting clinical information in the EMR, Independently interpreting results (not separately reported), Communicating results to the patient/family/caregiver, Counseling and educating the patient/family/caregiver and Care coordination (not separately reported).

## 2022-07-05 NOTE — Progress Notes (Signed)
   Subjective:    Patient ID: FABIAN ALMENDINGER, female    DOB: [birth year], 72 y.o.   MRN: [id number]  HPI Ms Garney is here for follow up of left shoulder infection.  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  She was placed on daptomycin through April 23rd.  Picc line now out.  No new issues.  Feels well.  Good ROM of both shoulders.  Has seen Dr. Eulah Pont who feels everything is good.    Review of Systems  Constitutional:  Negative for fatigue.  Gastrointestinal:  Negative for nausea.  Skin:  Negative for rash.       Objective:   Physical Exam Eyes:     General: No scleral icterus. Pulmonary:     Effort: Pulmonary effort is normal.  Neurological:     Mental Status: She is alert.   SH: no tobacco        Assessment & Plan:

## 2022-07-06 ENCOUNTER — Telehealth: Payer: Self-pay | Admitting: *Deleted

## 2022-07-06 NOTE — Telephone Encounter (Signed)
   Name: April Shaffer  DOB: Nov 20, 1950  MRN: [id number]  Primary Cardiologist: Nanetta Batty, MD  Chart reviewed as part of pre-operative protocol coverage. Because of April Shaffer's past medical history and time since last visit, she will require a follow-up telephone visit in order to better assess preoperative cardiovascular risk.  Pre-op covering staff: - Please schedule appointment and call patient to inform them. If patient already had an upcoming appointment within acceptable timeframe, please add "pre-op clearance" to the appointment notes so provider is aware. - Please contact requesting surgeon's office via preferred method (i.e, phone, fax) to inform them of need for appointment prior to surgery.  Per Dr. Randie Heinz, can remain on Plavix and aspirin perioperatively.  Sharlene Dory, PA-C  07/06/2022, 8:05 AM

## 2022-07-06 NOTE — Telephone Encounter (Signed)
S/w pt scheduled tele clearance.    Patient Consent for Virtual Visit         April Shaffer has provided verbal consent on 07/06/2022 for a virtual visit (video or telephone).   CONSENT FOR VIRTUAL VISIT FOR:  April Shaffer  By participating in this virtual visit I agree to the following:  I hereby voluntarily request, consent and authorize [facility] and its employed or contracted physicians, physician assistants, nurse practitioners or other licensed health care professionals (the Practitioner), to provide me with telemedicine health care services (the "Services") as deemed necessary by the treating Practitioner. I acknowledge and consent to receive the Services by the Practitioner via telemedicine. I understand that the telemedicine visit will involve communicating with the Practitioner through live audiovisual communication technology and the disclosure of certain medical information by electronic transmission. I acknowledge that I have been given the opportunity to request an in-person assessment or other available alternative prior to the telemedicine visit and am voluntarily participating in the telemedicine visit.  I understand that I have the right to withhold or withdraw my consent to the use of telemedicine in the course of my care at any time, without affecting my right to future care or treatment, and that the Practitioner or I may terminate the telemedicine visit at any time. I understand that I have the right to inspect all information obtained and/or recorded in the course of the telemedicine visit and may receive copies of available information for a reasonable fee.  I understand that some of the potential risks of receiving the Services via telemedicine include:  Delay or interruption in medical evaluation due to technological equipment failure or disruption; Information transmitted may not be sufficient (e.g. poor resolution of images) to allow for appropriate  medical decision making by the Practitioner; and/or  In rare instances, security protocols could fail, causing a breach of personal health information.  Furthermore, I acknowledge that it is my responsibility to provide information about my medical history, conditions and care that is complete and accurate to the best of my ability. I acknowledge that Practitioner's advice, recommendations, and/or decision may be based on factors not within their control, such as incomplete or inaccurate data provided by me or distortions of diagnostic images or specimens that may result from electronic transmissions. I understand that the practice of medicine is not an exact science and that Practitioner makes no warranties or guarantees regarding treatment outcomes. I acknowledge that a copy of this consent can be made available to me via my patient portal [facility] MyChart), or I can request a printed copy by calling the office of [facility].    I understand that my insurance will be billed for this visit.   I have read or had this consent read to me. I understand the contents of this consent, which adequately explains the benefits and risks of the Services being provided via telemedicine.  I have been provided ample opportunity to ask questions regarding this consent and the Services and have had my questions answered to my satisfaction. I give my informed consent for the services to be provided through the use of telemedicine in my medical care

## 2022-07-11 ENCOUNTER — Other Ambulatory Visit: Payer: Self-pay

## 2022-07-11 ENCOUNTER — Inpatient Hospital Stay: Payer: Medicare HMO | Attending: Nurse Practitioner

## 2022-07-11 DIAGNOSIS — D508 Other iron deficiency anemias: Secondary | ICD-10-CM | POA: Diagnosis present

## 2022-07-11 DIAGNOSIS — E538 Deficiency of other specified B group vitamins: Secondary | ICD-10-CM | POA: Diagnosis present

## 2022-07-11 DIAGNOSIS — D500 Iron deficiency anemia secondary to blood loss (chronic): Secondary | ICD-10-CM

## 2022-07-11 DIAGNOSIS — D649 Anemia, unspecified: Secondary | ICD-10-CM

## 2022-07-11 LAB — CBC WITH DIFFERENTIAL (CANCER CENTER ONLY)
Abs Immature Granulocytes: 0.06 10*3/uL (ref 0.00–0.07)
Basophils Absolute: 0.1 10*3/uL (ref 0.0–0.1)
Basophils Relative: 1 %
Eosinophils Absolute: 0.2 10*3/uL (ref 0.0–0.5)
Eosinophils Relative: 2 %
HCT: 31.5 % — ABNORMAL LOW (ref 36.0–46.0)
Hemoglobin: 9.9 g/dL — ABNORMAL LOW (ref 12.0–15.0)
Immature Granulocytes: 1 %
Lymphocytes Relative: 38 %
Lymphs Abs: 3.1 10*3/uL (ref 0.7–4.0)
MCH: 26.8 pg (ref 26.0–34.0)
MCHC: 31.4 g/dL (ref 30.0–36.0)
MCV: 85.4 fL (ref 80.0–100.0)
Monocytes Absolute: 0.8 10*3/uL (ref 0.1–1.0)
Monocytes Relative: 10 %
Neutro Abs: 3.9 10*3/uL (ref 1.7–7.7)
Neutrophils Relative %: 48 %
Platelet Count: 427 10*3/uL — ABNORMAL HIGH (ref 150–400)
RBC: 3.69 MIL/uL — ABNORMAL LOW (ref 3.87–5.11)
RDW: 17.7 % — ABNORMAL HIGH (ref 11.5–15.5)
WBC Count: 8 10*3/uL (ref 4.0–10.5)
nRBC: 0 % (ref 0.0–0.2)

## 2022-07-11 LAB — VITAMIN B12: Vitamin B-12: 716 pg/mL (ref 180–914)

## 2022-07-12 ENCOUNTER — Other Ambulatory Visit: Payer: Self-pay | Admitting: Nurse Practitioner

## 2022-07-12 DIAGNOSIS — E538 Deficiency of other specified B group vitamins: Secondary | ICD-10-CM | POA: Diagnosis not present

## 2022-07-12 DIAGNOSIS — D500 Iron deficiency anemia secondary to blood loss (chronic): Secondary | ICD-10-CM

## 2022-07-12 LAB — IRON AND IRON BINDING CAPACITY (CC-WL,HP ONLY)
Iron: 34 ug/dL (ref 28–170)
Saturation Ratios: 16 % (ref 10.4–31.8)
TIBC: 218 ug/dL — ABNORMAL LOW (ref 250–450)
UIBC: 184 ug/dL (ref 148–442)

## 2022-07-12 LAB — FERRITIN: Ferritin: 301 ng/mL (ref 11–307)

## 2022-07-13 ENCOUNTER — Encounter: Payer: Self-pay | Admitting: Nurse Practitioner

## 2022-07-13 ENCOUNTER — Ambulatory Visit: Payer: Medicare HMO | Attending: Internal Medicine | Admitting: Nurse Practitioner

## 2022-07-13 DIAGNOSIS — Z01810 Encounter for preprocedural cardiovascular examination: Secondary | ICD-10-CM | POA: Diagnosis not present

## 2022-07-13 NOTE — Progress Notes (Signed)
Virtual Visit via Telephone Note   Because of Claudina Dobner Dorko's co-morbid illnesses, she is at least at moderate risk for complications without adequate follow up.  This format is felt to be most appropriate for this patient at this time.  The patient did not have access to video technology/had technical difficulties with video requiring transitioning to audio format only (telephone).  All issues noted in this document were discussed and addressed.  No physical exam could be performed with this format.  Please refer to the patient's chart for her consent to telehealth for [facility].  Evaluation Performed:  Preoperative cardiovascular risk assessment _____________   Date:  07/13/2022   Patient ID:  April Shaffer, April Shaffer 03/03/1950, MRN [id number] Patient Location:  Home Provider location:   Office  Primary Care Provider:  Raymon Mutton., FNP Primary Cardiologist:  Nanetta Batty, MD  Chief Complaint / Patient Profile   72 y.o. y/o female with a h/o carotid artery disease, HTN, HLD, type 2 diabetes, iron deficiency anemia, CAD with cardiac cath in 2013 which did not demonstrate critical stenosis who is pending left transcarotid artery revascularization and presents today for telephonic preoperative cardiovascular risk assessment.  History of Present Illness    April Shaffer is a 72 y.o. female who presents via audio/video conferencing for a telehealth visit today.  Pt was last seen in cardiology clinic on 03/16/22 by Joni Reining, NP.  At that time April Shaffer was doing well.  The patient is now pending procedure as outlined above. Since her last visit, she  denies chest pain, shortness of breath, lower extremity edema, fatigue, palpitations, melena, hematuria, hemoptysis, diaphoresis, weakness, presyncope, syncope, orthopnea, and PND. She is recovering from arm surgery but is able to complete > 4 METS activity without concerning cardiac symptoms.  Past Medical  History    Past Medical History:  Diagnosis Date   Anemia    Arthritis    Blood transfusion without reported diagnosis    Carotid artery narrowing    Coronary artery disease    Cardiac catheterization November 2013: 50% ostial LAD stenosis 50% mid stenosis. 30% disease in the left circumflex.   Diabetes mellitus, type 2 (HCC)    Hyperlipidemia    Hypertension    Onychomycosis of toenail 07/31/2016   PAD (peripheral artery disease) (HCC)    Sleep apnea       Had surgery to correct   Thyroid nodule    Past Surgical History:  Procedure Laterality Date   ABDOMINAL AORTOGRAM W/LOWER EXTREMITY N/A 08/07/2021   Procedure: ABDOMINAL AORTOGRAM W/LOWER EXTREMITY;  Surgeon: Maeola Harman, MD;  Location: [facility] INVASIVE CV LAB;  Service: Cardiovascular;  Laterality: N/A;   ABDOMINAL AORTOGRAM W/LOWER EXTREMITY Right 10/23/2021   Procedure: ABDOMINAL AORTOGRAM W/LOWER EXTREMITY;  Surgeon: Maeola Harman, MD;  Location: [facility] INVASIVE CV LAB;  Service: Cardiovascular;  Laterality: Right;   ABDOMINAL HYSTERECTOMY     APPLICATION OF INTRAOPERATIVE CT SCAN N/A 11/30/2020   Procedure: APPLICATION OF INTRAOPERATIVE CT SCAN;  Surgeon: Tia Alert, MD;  Location: [facility] OR;  Service: Neurosurgery;  Laterality: N/A;   BACK SURGERY     CARDIAC CATHETERIZATION     CATARACT EXTRACTION, BILATERAL Bilateral 2021   Dr. Joseph Art   ENDARTERECTOMY  09/27/2011   Procedure: RIGT ENDARTERECTOMY CAROTID;  Surgeon: Pryor Ochoa, MD;  Location: [facility] OR;  Service: Vascular;  Laterality: Right;   EPIDURAL BLOCK INJECTION  02/2008   Drs. Dorthula Nettles  gyn surgery  2004   total hysterectomy for mennorhagia,,salpingoophorectomy   INCISION AND DRAINAGE ABSCESS Left 05/08/2022   Procedure: ARTHROSCOPIC INCISION AND DRAINAGE ABSCESS, DISTAL CLAVICLE EXCISSION, SUBACROMIAL DECOMPRESSION, LOOSE BODY REMOVAL, EXTENSIVE DEBRIDEMENT;  Surgeon: Sheral Apley, MD;  Location: [facility] ORS;  Service: Orthopedics;   Laterality: Left;   LAMINECTOMY WITH POSTERIOR LATERAL ARTHRODESIS LEVEL 2 N/A 11/30/2020   Procedure: LUMBAR FOUR-FIVE, LUMBAR FIVE-SACRAL ONE POSTERIOR LATERAL FUSION WITH REVISION OF LUMBAR ONE-FIVE HARDWARE AND EXTENSION TO SACRAL ONE AND SACRAL TWO;  Surgeon: Tia Alert, MD;  Location: [facility] OR;  Service: Neurosurgery;  Laterality: N/A;   LUMBAR WOUND DEBRIDEMENT N/A 05/25/2020   Procedure: LUMBAR WOUND IRRIGATION AND DEBRIDEMENT;  Surgeon: Tia Alert, MD;  Location: [facility] OR;  Service: Neurosurgery;  Laterality: N/A;   PERIPHERAL VASCULAR INTERVENTION  08/07/2021   Procedure: PERIPHERAL VASCULAR INTERVENTION;  Surgeon: Maeola Harman, MD;  Location: [facility] INVASIVE CV LAB;  Service: Cardiovascular;;  Rt SFA   PERIPHERAL VASCULAR INTERVENTION Right 10/23/2021   Procedure: PERIPHERAL VASCULAR INTERVENTION;  Surgeon: Maeola Harman, MD;  Location: [facility] INVASIVE CV LAB;  Service: Cardiovascular;  Laterality: Right;  SFA   POSTERIOR LUMBAR FUSION 4 LEVEL N/A 04/25/2020   Procedure: POSTERIOR LUMBAR INTERBODY FUSION LUMBAR ONE-TWO, LUMBAR TWO-THREE, LUMBAR THREE-FOUR,LUMBAR FOUR-FIVE.;  Surgeon: Tia Alert, MD;  Location: [facility] OR;  Service: Neurosurgery;  Laterality: N/A;  posterior   SHOULDER ARTHROSCOPY Left 05/08/2022   Procedure: ARTHROSCOPY SHOULDER;  Surgeon: Sheral Apley, MD;  Location: [facility] ORS;  Service: Orthopedics;  Laterality: Left;   TONSILLECTOMY     TOTAL ABDOMINAL HYSTERECTOMY W/ BILATERAL SALPINGOOPHORECTOMY Bilateral 2000   TOTAL HIP ARTHROPLASTY Right 08/22/2018   Procedure: TOTAL HIP ARTHROPLASTY;  Surgeon: Frederico Hamman, MD;  Location: [facility] ORS;  Service: Orthopedics;  Laterality: Right;   VARICOSE VEIN SURGERY  2008   stripping    Allergies  Allergies  Allergen Reactions   Zestoretic [Lisinopril-Hydrochlorothiazide] Swelling    Angioedema  - Tongue swelling    Codeine Itching   Oxycodone Itching    Patient is taking at this time   Ultram  [Tramadol] Itching   Zocor [Simvastatin] Other (See Comments)    Muscle pain    Home Medications    Prior to Admission medications   Medication Sig Start Date End Date Taking? Authorizing Provider  acetaminophen (TYLENOL) 500 MG tablet Take 500 mg by mouth 2 (two) times daily as needed for moderate pain.    [provider]  allopurinol (ZYLOPRIM) 300 MG tablet Take 1 tablet (300 mg total) by mouth daily. Patient taking differently: Take 300 mg by mouth daily as needed (gout). 07/09/19   Julieanne Manson, MD  amLODipine (NORVASC) 10 MG tablet Take 10 mg by mouth in the morning.    [provider]  aspirin EC 81 MG tablet Take 81 mg by mouth in the morning.    [provider]  atorvastatin (LIPITOR) 80 MG tablet Take 80 mg by mouth daily.    [provider]  carvedilol (COREG CR) 20 MG 24 hr capsule Take 1 capsule (20 mg total) by mouth daily. 07/09/19   Julieanne Manson, MD  clopidogrel (PLAVIX) 75 MG tablet Take 75 mg by mouth in the morning.    [provider]  cyanocobalamin (VITAMIN B12) 1000 MCG tablet Take 1,000 mcg by mouth daily.    [provider]  ezetimibe (ZETIA) 10 MG tablet TAKE 1 TABLET EVERY DAY (NEED MD APPOINTMENT) Patient taking differently: Take  10 mg by mouth daily. 09/27/20   Runell Gess, MD  ferrous sulfate 325 (65 FE) MG tablet Take 325 mg by mouth daily.    [provider]  gabapentin (NEURONTIN) 300 MG capsule Take 300 mg by mouth 3 (three) times daily. 06/09/21   [provider]  glucose blood test strip Check sugars twice daily 01/31/16   Julieanne Manson, MD  metFORMIN (GLUCOPHAGE) 500 MG tablet Take 500 mg by mouth in the morning and at bedtime. 04/08/20   [provider]  oxyCODONE-acetaminophen (PERCOCET) 10-325 MG tablet Take 1 tablet by mouth every 4 (four) hours as needed for pain. Patient taking differently: Take 1 tablet by mouth every 6 (six) hours as needed for pain.  12/03/20   Tia Alert, MD    Physical Exam    Vital Signs:  April Shaffer does not have vital signs available for review today.  Given telephonic nature of communication, physical exam is limited. AAOx3. NAD. Normal affect.  Speech and respirations are unlabored.  Accessory Clinical Findings    None  Assessment & Plan    1.  Preoperative Cardiovascular Risk Assessment: According to the Revised Cardiac Risk Index (RCRI), her Perioperative Risk of Major Cardiac Event is (%): 0.9. Her Functional Capacity in METs is: 6.05 according to the Duke Activity Status Index (DASI). The patient is doing well from a cardiac perspective. Therefore, based on ACC/AHA guidelines, the patient would be at acceptable risk for the planned procedure without further cardiovascular testing.   The patient was advised that if she develops new symptoms prior to surgery to contact our office to arrange for a follow-up visit, and she verbalized understanding.  Per Dr. Randie Heinz, patient will remain on aspirin and Plavix.   A copy of this note will be routed to requesting surgeon.  Time:   Today, I have spent 10 minutes with the patient with telehealth technology discussing medical history, symptoms, and management plan.    Levi Aland, NP-C  07/13/2022, 9:58 AM [address] Office [phone] Fax [phone]

## 2022-07-16 ENCOUNTER — Encounter: Payer: Self-pay | Admitting: Infectious Diseases

## 2022-07-16 ENCOUNTER — Telehealth: Payer: Self-pay

## 2022-07-16 NOTE — Telephone Encounter (Signed)
Call and notified the pt to continue to take Oral Iron and B12 and to keep her lab appointment that she has in August , per NP Lacie. The pt verbalized understanding.

## 2022-07-31 ENCOUNTER — Other Ambulatory Visit (HOSPITAL_COMMUNITY): Payer: Medicare HMO

## 2022-07-31 NOTE — Progress Notes (Signed)
     Your surgery and Pre-Admission testing visit will be at [facility] located at [address], [place], [place] [address].  Please let all your doctors (i.e., Primary Care Physician, Cardiologist, Endocrinologist, Pulmonologist) know you are having surgery. You may need clearance for surgery. If you are on blood thinners, notify your surgeon and ask the doctor who prescribed them how long to hold them before surgery.  If you have had a heart test, such as an EKG, stress test, heart ultrasound, etc., or lab work performed outside of [place], please bring copies of these tests to your Pre-Admission testing, if possible.  These departments may contact you before the day of surgery:  Pre-Service Center - insurance/ billing: [phone] Pharmacy- to review your medications: [phone] Pre-Admission Testing- to set an appointment for your visit: [phone]  (Often, these numbers show up as "SPAM" on your phone)  The Pre-Admission Testing (PAT) visit focuses on Anesthesia for your upcoming surgery.  You do NOT need to fast; take your medications as usual. Please arrive 30 minutes early to allow for parking and admitting.  The visit may last up to an hour. Bring a photo ID and medical insurance card. Reschedule if you are sick. ([phone]) and please, NO children under age 16 at the visit.  During the PAT visit:  We will review your medical and surgical history.   You will receive pre-operative instructions, including the time of arrival at the hospital and surgical start time.  We will review what medication(s) you can take on the day of surgery.  After speaking with the nurse, you will have blood drawn and, if needed, a chest x-ray and EKG.  Most lab results from your doctor are good for 30 days, Hemoglobin A1C is good for 60 days. If you cannot talk to the Pharmacy, bring your medications or a list of them to the PST visit.   Infection control for the [facility] requires: All fingernail and toenail products should be removed before the day of surgery.  (SNS, Acrylic, Gel, Polish, Stickers, Press on, and Poly gel nails.)   Parking information:  Address:  Hospital - [address], [place], [place] [address]  Please look for signs for entrance A off of [address]. Free valet parking is available Monday-Friday 05:30am-06:00pm     

## 2022-07-31 NOTE — Progress Notes (Signed)
Surgical Instructions    Your procedure is scheduled on Tuesday August 07, 2022.  Report to [facility] Main Entrance "A" at 5:30 A.M., then check in with the Admitting office.  Call this number if you have problems the morning of surgery:  [phone]   If you have any questions prior to your surgery date call [phone]: Open Monday-Friday 8am-4pm If you experience any cold or flu symptoms such as cough, fever, chills, shortness of breath, etc. between now and your scheduled surgery, please notify us at the above number     Remember:  Do not eat or drink after midnight the night before your surgery   Take these medicines the morning of surgery with A SIP OF WATER:  allopurinol (ZYLOPRIM)  amLODipine (NORVASC)  atorvastatin (LIPITOR)  carvedilol (COREG CR)  gabapentin (NEURONTIN)  ezetimibe (ZETIA)   If Needed:  acetaminophen (TYLENOL)  oxyCODONE-acetaminophen (PERCOCET)   Follow your surgeon's instructions on when to stop clopidogrel (PLAVIX) Aspirin.  If no instructions were given by your surgeon then you will need to call the office to get those instructions.    As of today, STOP taking any (unless otherwise instructed by your surgeon) Aleve, Naproxen, Ibuprofen, Motrin, Advil, Goody's, BC's, all herbal medications, fish oil, and all vitamins.  WHAT DO I DO ABOUT MY DIABETES MEDICATION?   Do not take metFORMIN (GLUCOPHAGE) the morning of surgery.   HOW TO MANAGE YOUR DIABETES BEFORE AND AFTER SURGERY  Why is it important to control my blood sugar before and after surgery? Improving blood sugar levels before and after surgery helps healing and can limit problems. A way of improving blood sugar control is eating a healthy diet by:  Eating less sugar and carbohydrates  Increasing activity/exercise  Talking with your doctor about reaching your blood sugar goals High blood sugars (greater than 180 mg/dL) can raise your risk of infections and slow your recovery, so you  will need to focus on controlling your diabetes during the weeks before surgery. Make sure that the doctor who takes care of your diabetes knows about your planned surgery including the date and location.  How do I manage my blood sugar before surgery? Check your blood sugar at least 4 times a day, starting 2 days before surgery, to make sure that the level is not too high or low.  Check your blood sugar the morning of your surgery when you wake up and every 2 hours until you get to the Short Stay unit.  If your blood sugar is less than 70 mg/dL, you will need to treat for low blood sugar: Do not take insulin. Treat a low blood sugar (less than 70 mg/dL) with  cup of clear juice (cranberry or apple), 4 glucose tablets, OR glucose gel. Recheck blood sugar in 15 minutes after treatment (to make sure it is greater than 70 mg/dL). If your blood sugar is not greater than 70 mg/dL on recheck, call [phone] for further instructions. Report your blood sugar to the short stay nurse when you get to Short Stay.  If you are admitted to the hospital after surgery: Your blood sugar will be checked by the staff and you will probably be given insulin after surgery (instead of oral diabetes medicines) to make sure you have good blood sugar levels. The goal for blood sugar control after surgery is 80-180 mg/dL.         Special instructions:    Oral Hygiene is also important to reduce your risk of infection.  Remember - BRUSH YOUR TEETH THE MORNING OF SURGERY WITH YOUR REGULAR TOOTHPASTE   [place]- Preparing For Surgery  Before surgery, you can play an important role. Because skin is not sterile, your skin needs to be as free of germs as possible. You can reduce the number of germs on your skin by washing with CHG (chlorahexidine gluconate) Soap before surgery.  CHG is an antiseptic cleaner which kills germs and bonds with the skin to continue killing germs even after washing.     Please do not use  if you have an allergy to CHG or antibacterial soaps. If your skin becomes reddened/irritated stop using the CHG.  Do not shave (including legs and underarms) for at least 48 hours prior to first CHG shower. It is OK to shave your face.  Please follow these instructions carefully.     Shower the NIGHT BEFORE SURGERY and the MORNING OF SURGERY with CHG Soap.   If you chose to wash your hair, wash your hair first as usual with your normal shampoo. After you shampoo, rinse your hair and body thoroughly to remove the shampoo.  Then [employer] and genitals (private parts) with your normal soap and rinse thoroughly to remove soap.  After that Use CHG Soap as you would any other liquid soap. You can apply CHG directly to the skin and wash gently with a scrungie or a clean washcloth.   Apply the CHG Soap to your body ONLY FROM THE NECK DOWN.  Do not use on open wounds or open sores. Avoid contact with your eyes, ears, mouth and genitals (private parts). Wash Face and genitals (private parts)  with your normal soap.   Wash thoroughly, paying special attention to the area where your surgery will be performed.  Thoroughly rinse your body with warm water from the neck down.  DO NOT shower/wash with your normal soap after using and rinsing off the CHG Soap.  Pat yourself dry with a CLEAN TOWEL.  Wear CLEAN PAJAMAS to bed the night before surgery  Place CLEAN SHEETS on your bed the night before your surgery  DO NOT SLEEP WITH PETS.   Day of Surgery:  Take a shower with CHG soap. Wear Clean/Comfortable clothing the morning of surgery Do not apply any deodorants/lotions.   Remember to brush your teeth WITH YOUR REGULAR TOOTHPASTE.  Do not wear jewelry or makeup. Do not wear lotions, powders, perfumes/cologne or deodorant. Do not shave 48 hours prior to surgery.  Men may shave face and neck. Do not bring valuables to the hospital. Do not wear nail polish, gel polish, artificial nails, or any  other type of covering on natural nails (fingers and toes) If you have artificial nails or gel coating that need to be removed by a nail salon, please have this removed prior to surgery. Artificial nails or gel coating may interfere with anesthesia's ability to adequately monitor your vital signs.  [facility] is not responsible for any belongings or valuables.    Do NOT Smoke (Tobacco/Vaping)  24 hours prior to your procedure  If you use a CPAP at night, you may bring your mask for your overnight stay.   Contacts, glasses, hearing aids, dentures or partials may not be worn into surgery, please bring cases for these belongings   For patients admitted to the hospital, discharge time will be determined by your treatment team.   Patients discharged the day of surgery will not be allowed to drive home, and someone needs  to stay with them for 24 hours.   SURGICAL WAITING ROOM VISITATION Patients having surgery or a procedure may have no more than 2 support people in the waiting area - these visitors may rotate.   Children under the age of 8 must have an adult with them who is not the patient. If the patient needs to stay at the hospital during part of their recovery, the visitor guidelines for inpatient rooms apply. Pre-op nurse will coordinate an appropriate time for 1 support person to accompany patient in pre-op.  This support person may not rotate.   Please refer to [email or web address] for the visitor guidelines for Inpatients (after your surgery is over and you are in a regular room).   If you received a COVID test during your pre-op visit, it is requested that you wear a mask when out in public, stay away from anyone that may not be feeling well, and notify your surgeon if you develop symptoms. If you have been in contact with anyone that has tested positive in the last 10 days, please notify your surgeon.    Please read over the  following fact sheets that you were given.

## 2022-08-01 ENCOUNTER — Encounter (HOSPITAL_COMMUNITY): Payer: Self-pay

## 2022-08-01 ENCOUNTER — Other Ambulatory Visit: Payer: Self-pay

## 2022-08-01 ENCOUNTER — Encounter (HOSPITAL_COMMUNITY)
Admission: RE | Admit: 2022-08-01 | Discharge: 2022-08-01 | Disposition: A | Discharge status: Home / Self Care | Payer: Medicare HMO | Source: Ambulatory Visit | Attending: Vascular Surgery | Admitting: Vascular Surgery

## 2022-08-01 VITALS — BP 135/66 | HR 79 | Temp 98.1°F | Resp 18 | Ht 63.0 in | Wt 133.2 lb

## 2022-08-01 DIAGNOSIS — I6522 Occlusion and stenosis of left carotid artery: Secondary | ICD-10-CM

## 2022-08-01 DIAGNOSIS — Z01818 Encounter for other preprocedural examination: Secondary | ICD-10-CM

## 2022-08-01 DIAGNOSIS — I2510 Atherosclerotic heart disease of native coronary artery without angina pectoris: Secondary | ICD-10-CM | POA: Insufficient documentation

## 2022-08-01 DIAGNOSIS — E119 Type 2 diabetes mellitus without complications: Secondary | ICD-10-CM | POA: Diagnosis not present

## 2022-08-01 DIAGNOSIS — Z7901 Long term (current) use of anticoagulants: Secondary | ICD-10-CM | POA: Insufficient documentation

## 2022-08-01 DIAGNOSIS — I10 Essential (primary) hypertension: Secondary | ICD-10-CM | POA: Diagnosis not present

## 2022-08-01 LAB — CBC
HCT: 34.7 % — ABNORMAL LOW (ref 36.0–46.0)
Hemoglobin: 10.8 g/dL — ABNORMAL LOW (ref 12.0–15.0)
MCH: 26.9 pg (ref 26.0–34.0)
MCHC: 31.1 g/dL (ref 30.0–36.0)
MCV: 86.3 fL (ref 80.0–100.0)
Platelets: 350 10*3/uL (ref 150–400)
RBC: 4.02 MIL/uL (ref 3.87–5.11)
RDW: 17.2 % — ABNORMAL HIGH (ref 11.5–15.5)
WBC: 7.5 10*3/uL (ref 4.0–10.5)
nRBC: 0 % (ref 0.0–0.2)

## 2022-08-01 LAB — URINALYSIS, ROUTINE W REFLEX MICROSCOPIC
Bilirubin Urine: NEGATIVE
Glucose, UA: NEGATIVE mg/dL
Hgb urine dipstick: NEGATIVE
Ketones, ur: NEGATIVE mg/dL
Leukocytes,Ua: NEGATIVE
Nitrite: NEGATIVE
Protein, ur: 30 mg/dL — AB
Specific Gravity, Urine: 1.011 (ref 1.005–1.030)
pH: 6 (ref 5.0–8.0)

## 2022-08-01 LAB — TYPE AND SCREEN
ABO/RH(D): O POS
Antibody Screen: NEGATIVE

## 2022-08-01 LAB — PROTIME-INR
INR: 1.1 (ref 0.8–1.2)
Prothrombin Time: 14.4 seconds (ref 11.4–15.2)

## 2022-08-01 LAB — COMPREHENSIVE METABOLIC PANEL
ALT: 11 U/L (ref 0–44)
AST: 15 U/L (ref 15–41)
Albumin: 2.7 g/dL — ABNORMAL LOW (ref 3.5–5.0)
Alkaline Phosphatase: 122 U/L (ref 38–126)
Anion gap: 11 (ref 5–15)
BUN: 11 mg/dL (ref 8–23)
CO2: 24 mmol/L (ref 22–32)
Calcium: 8.7 mg/dL — ABNORMAL LOW (ref 8.9–10.3)
Chloride: 100 mmol/L (ref 98–111)
Creatinine, Ser: 0.83 mg/dL (ref 0.44–1.00)
GFR, Estimated: >60 mL/min (ref >60)
Glucose, Bld: 120 mg/dL — ABNORMAL HIGH (ref 70–99)
Potassium: 3.9 mmol/L (ref 3.5–5.1)
Sodium: 135 mmol/L (ref 135–145)
Total Bilirubin: 0.4 mg/dL (ref 0.3–1.2)
Total Protein: 9.2 g/dL — ABNORMAL HIGH (ref 6.5–8.1)

## 2022-08-01 LAB — SURGICAL PCR SCREEN
MRSA, PCR: NEGATIVE
Staphylococcus aureus: NEGATIVE

## 2022-08-01 LAB — APTT: aPTT: 28 seconds (ref 24–36)

## 2022-08-01 LAB — GLUCOSE, CAPILLARY: Glucose-Capillary: 99 mg/dL (ref 70–99)

## 2022-08-01 NOTE — Progress Notes (Addendum)
PCP -Fatima Sanger FNP  Cardiologist - Obie Dredge  PPM/ICD - denies Device Orders -  Rep Notified -   Chest x-ray - 04/19/22 EKG - 04/23/22 Stress Test - 12/31/11 ECHO - none Cardiac Cath - 01/16/12  Sleep Study - denies CPAP - no  Fasting Blood Sugar - 120-130 Checks Blood Sugar every morning  Last dose of GLP1 agonist-na   GLP1 instructions:   Blood Thinner Instructions:pt states she is to continue Aspirin and Plavix and take the day of surgery per Dr. Darcella Cheshire instructions.  Aspirin Instructions:see above.  ERAS Protcol -no PRE-SURGERY Ensure or G2-   COVID TEST- na   Anesthesia review: yes- coronary artery disease,cardiac clearance  Patient denies shortness of breath, fever, cough and chest pain at PAT appointment   All instructions explained to the patient, with a verbal understanding of the material. Patient agrees to go over the instructions while at home for a better understanding. Patient also instructed to wear a mask when out in public prior to surgery. The opportunity to ask questions was provided.

## 2022-08-02 LAB — HEMOGLOBIN A1C
Hgb A1c MFr Bld: 6 % — ABNORMAL HIGH (ref 4.8–5.6)
Mean Plasma Glucose: 126 mg/dL

## 2022-08-02 NOTE — Anesthesia Preprocedure Evaluation (Addendum)
Anesthesia Evaluation  Patient identified by MRN, date of birth, ID band Patient awake    Reviewed: Allergy & Precautions, NPO status , Patient's Chart, lab work & pertinent test results, reviewed documented beta blocker date and time   Airway Mallampati: II  TM Distance: >3 FB Neck ROM: Full    Dental  (+) Dental Advisory Given, Partial Upper, Missing   Pulmonary sleep apnea , Current Smoker and Patient abstained from smoking.   breath sounds clear to auscultation       Cardiovascular hypertension, Pt. on medications and Pt. on home beta blockers + CAD and + Peripheral Vascular Disease   Rhythm:Regular Rate:Normal     Neuro/Psych  Neuromuscular disease    GI/Hepatic   Endo/Other  diabetes, Type 2, Oral Hypoglycemic Agents    Renal/GU Renal disease     Musculoskeletal  (+) Arthritis ,    Abdominal   Peds  Hematology   Anesthesia Other Findings   Reproductive/Obstetrics                             Anesthesia Physical Anesthesia Plan  ASA: 3  Anesthesia Plan: General   Post-op Pain Management: Tylenol PO (pre-op)*   Induction: Intravenous  PONV Risk Score and Plan: 3 and Ondansetron, Dexamethasone and Midazolam  Airway Management Planned: Oral ETT  Additional Equipment: Arterial line  Intra-op Plan:   Post-operative Plan: Extubation in OR  Informed Consent: I have reviewed the patients History and Physical, chart, labs and discussed the procedure including the risks, benefits and alternatives for the proposed anesthesia with the patient or authorized representative who has indicated his/her understanding and acceptance.     Dental advisory given  Plan Discussed with: CRNA  Anesthesia Plan Comments: (PAT note by Antionette Poles, PA-C:  Patient follows with cardiology for history of nonobstructive CAD by cath 2013, HTN, HLD, carotid artery disease (s/p right CEA 09/2011 with  subsequent CTO).  Seen by Eligha Bridegroom, NP 07/13/2022 for preop evaluation.  Per note, "Preoperative Cardiovascular Risk Assessment: According to the Revised Cardiac Risk Index (RCRI), her Perioperative Risk of Major Cardiac Event is (%): 0.9. Her Functional Capacity in METs is: 6.05 according to the Duke Activity Status Index (DASI). The patient is doing well from a cardiac perspective. Therefore, based on ACC/AHA guidelines, the patient would be at acceptable risk for the planned procedure without further cardiovascular testing. The patient was advised that if she develops new symptoms prior to surgery to contact our office to arrange for a follow-up visit, and she verbalized understanding. Per Dr. Randie Heinz, patient will remain on aspirin and Plavix."  History of PAD s/p right SFA stenting for rest pain in June 2023 with subsequent in-stent restenosis requiring repeat intervention in August 2023.  Non-insulin-dependent DM2.  History of OSA.  Not on CPAP s/p tonsillectomy and adenoidectomy.  Preop labs reviewed, notable for hypoalbuminemia with albumin 2.7, mild anemia with hemoglobin 10.8, otherwise unremarkable.  EKG 04/23/2022: Sinus rhythm.  Rate 72.  Borderline short PR interval.  Probable LAE.  CTA head neck 02/15/2022: IMPRESSION: 1. 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 2. Stable high-grade stenosis of the left carotid bifurcation of approximately 75%. 3. Stable severe bilateral vertebral artery stenoses at the origins. 4. 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 5. Stable moderate inferior left M3 stenosis. 6. The previously seen posterior right M3 stenosis is not  present on today's exam. 7. Stable mild distal small vessel disease without other significant proximal stenosis, aneurysm, or branch vessel occlusion within the Circle of Willis. 8. Stable left thyroid goiter. Recommend  thyroid US (ref: J Am Coll Radiol. 2015 Feb;12(2): 143-50). 9. Aortic Atherosclerosis (ICD10-I70.0) and Emphysema (ICD10-J43.9).  Nuclear stress 03/28/2018: ? Nuclear stress EF: 68%. ? The left ventricular ejection fraction is hyperdynamic (>65%). ? There was no ST segment deviation noted during stress. ? The study is normal. ? This is a low risk study.   Normal pharmacologic nuclear study with no evidence for a prior infarct or ischemia. Since the prior study in 2013 ischemia is no longer present.  LHC 01/16/2012 LM: Normal in size. 10% distal stenosis LAD: Normal in size. 50% discrete ostial stenosis.In the proximal extending into the midsegment, there is a 50% tubular stenosis followed by a short aneurysmal segment. The rest of the vessel has minor irregularities. D1: 30% proximal stenosis. D2: 30% proximal stenosis. D3: Small with minor irregularities. LCX: Large in size and dominant. Minor irregularities. 30% proximal stenosis. OM1: Small in size with no significant disease. OM2: Small in size with no significant disease. OM3: Normal in size with diffuse 30% proximal disease. Posterior AV groove: Minor irregularities and gives the PDA and 2 PLA branches. RCA: Small and nondominant. Minor irregularities. Final Conclusions:  1.No evidence of obstructive coronary artery disease. There is moderate LAD disease which is diffuse as well as mild disease in the left circumflex. 2. Normal LV systolic function. 3. Moderate systemic hypertension with mildly elevated LVEDP and mild gradient across the aortic valve.    )        Anesthesia Quick Evaluation

## 2022-08-02 NOTE — Progress Notes (Signed)
Anesthesia Chart Review:  Patient follows with cardiology for history of nonobstructive CAD by cath 2013, HTN, HLD, carotid artery disease (s/p right CEA 09/2011 with subsequent CTO).  Seen by Eligha Bridegroom, NP 07/13/2022 for preop evaluation.  Per note, "Preoperative Cardiovascular Risk Assessment: According to the Revised Cardiac Risk Index (RCRI), her Perioperative Risk of Major Cardiac Event is (%): 0.9. Her Functional Capacity in METs is: 6.05 according to the Duke Activity Status Index (DASI). The patient is doing well from a cardiac perspective. Therefore, based on ACC/AHA guidelines, the patient would be at acceptable risk for the planned procedure without further cardiovascular testing. The patient was advised that if she develops new symptoms prior to surgery to contact our office to arrange for a follow-up visit, and she verbalized understanding. Per Dr. Randie Heinz, patient will remain on aspirin and Plavix."  History of PAD s/p right SFA stenting for rest pain in June 2023 with subsequent in-stent restenosis requiring repeat intervention in August 2023.  Non-insulin-dependent DM2.  History of OSA.  Not on CPAP s/p tonsillectomy and adenoidectomy.  Preop labs reviewed, notable for hypoalbuminemia with albumin 2.7, mild anemia with hemoglobin 10.8, otherwise unremarkable.  EKG 04/23/2022: Sinus rhythm.  Rate 72.  Borderline short PR interval.  Probable LAE.  CTA head neck 02/15/2022: IMPRESSION: 1. 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 2. Stable high-grade stenosis of the left carotid bifurcation of approximately 75%. 3. Stable severe bilateral vertebral artery stenoses at the origins. 4. 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 5. Stable moderate inferior left M3 stenosis. 6. The previously seen posterior right M3 stenosis is not present on today's exam. 7. Stable mild distal small  vessel disease without other significant proximal stenosis, aneurysm, or branch vessel occlusion within the Circle of Willis. 8. Stable left thyroid goiter. Recommend thyroid US (ref: J Am Coll Radiol. 2015 Feb;12(2): 143-50). 9. Aortic Atherosclerosis (ICD10-I70.0) and Emphysema (ICD10-J43.9).  Nuclear stress 03/28/2018: Nuclear stress EF: 68%. The left ventricular ejection fraction is hyperdynamic (>65%). There was no ST segment deviation noted during stress. The study is normal. This is a low risk study.   Normal pharmacologic nuclear study with no evidence for a prior infarct or ischemia. Since the prior study in 2013 ischemia is no longer present.   LHC 01/16/2012 LM: Normal in size. 10% distal stenosis LAD: Normal in size. 50% discrete ostial stenosis. In the proximal extending into the midsegment, there is a 50% tubular stenosis followed by a short aneurysmal segment. The rest of the vessel has minor irregularities. D1: 30% proximal stenosis. D2: 30% proximal stenosis. D3: Small with minor irregularities. LCX: Large in size and dominant. Minor irregularities. 30% proximal stenosis. OM1: Small in size with no significant disease. OM2: Small in size with no significant disease. OM3: Normal in size with diffuse 30% proximal disease. Posterior AV groove: Minor irregularities and gives the PDA and 2 PLA branches. RCA: Small and nondominant. Minor irregularities. Final Conclusions:   1. No evidence of obstructive coronary artery disease. There is moderate LAD disease which is diffuse as well as mild disease in the left circumflex. 2. Normal LV systolic function. 3. Moderate systemic hypertension with mildly elevated LVEDP and mild gradient across the aortic valve.    Zannie Cove [facility] Short Stay Center/Anesthesiology Phone [phone] 08/02/2022 11:02 AM\

## 2022-08-06 MED ORDER — DEXMEDETOMIDINE HCL IN NACL 400 MCG/100ML IV SOLN
0.1000 ug/kg/h | INTRAVENOUS | Status: DC
  Filled 2022-08-06: qty 100

## 2022-08-06 MED ORDER — POTASSIUM CHLORIDE 2 MEQ/ML IV SOLN
80.0000 meq | INTRAVENOUS | Status: DC
  Filled 2022-08-06: qty 40

## 2022-08-06 MED ORDER — CEFAZOLIN SODIUM-DEXTROSE 2-4 GM/100ML-% IV SOLN
2.0000 g | INTRAVENOUS | Status: AC
  Administered 2022-08-07: 2 g via INTRAVENOUS
  Filled 2022-08-06 (×2): qty 100

## 2022-08-06 MED ORDER — NOREPINEPHRINE 4 MG/250ML-% IV SOLN
0.0000 ug/min | INTRAVENOUS | Status: DC
  Filled 2022-08-06: qty 250

## 2022-08-06 MED ORDER — HEPARIN 30,000 UNITS/1000 ML (OHS) CELLSAVER SOLUTION
Status: DC
  Filled 2022-08-06: qty 1000

## 2022-08-06 MED ORDER — MAGNESIUM SULFATE 50 % IJ SOLN
40.0000 meq | INTRAMUSCULAR | Status: DC
  Filled 2022-08-06: qty 9.85

## 2022-08-06 MED ORDER — CEFAZOLIN SODIUM-DEXTROSE 2-4 GM/100ML-% IV SOLN
2.0000 g | INTRAVENOUS | Status: DC
  Filled 2022-08-06: qty 100

## 2022-08-07 ENCOUNTER — Inpatient Hospital Stay (HOSPITAL_COMMUNITY)
Admission: RE | Admit: 2022-08-07 | Discharge: 2022-08-09 | DRG: 036 | Disposition: A | Discharge status: Home Health | Payer: Medicare HMO | Attending: Vascular Surgery | Admitting: Vascular Surgery

## 2022-08-07 ENCOUNTER — Other Ambulatory Visit: Payer: Self-pay

## 2022-08-07 ENCOUNTER — Inpatient Hospital Stay (HOSPITAL_COMMUNITY): Payer: Medicare HMO

## 2022-08-07 ENCOUNTER — Inpatient Hospital Stay (HOSPITAL_COMMUNITY): Payer: Medicare HMO | Admitting: Physician Assistant

## 2022-08-07 ENCOUNTER — Encounter (HOSPITAL_COMMUNITY): Payer: Self-pay | Admitting: Vascular Surgery

## 2022-08-07 ENCOUNTER — Inpatient Hospital Stay (HOSPITAL_COMMUNITY): Payer: Medicare HMO | Admitting: Registered Nurse

## 2022-08-07 ENCOUNTER — Encounter (HOSPITAL_COMMUNITY)
Admission: RE | Disposition: A | Discharge status: Home Health | Payer: Self-pay | Source: Home / Self Care | Attending: Vascular Surgery

## 2022-08-07 DIAGNOSIS — I6522 Occlusion and stenosis of left carotid artery: Secondary | ICD-10-CM

## 2022-08-07 DIAGNOSIS — Z833 Family history of diabetes mellitus: Secondary | ICD-10-CM | POA: Diagnosis not present

## 2022-08-07 DIAGNOSIS — Z885 Allergy status to narcotic agent status: Secondary | ICD-10-CM

## 2022-08-07 DIAGNOSIS — E785 Hyperlipidemia, unspecified: Secondary | ICD-10-CM | POA: Diagnosis present

## 2022-08-07 DIAGNOSIS — I10 Essential (primary) hypertension: Secondary | ICD-10-CM | POA: Diagnosis present

## 2022-08-07 DIAGNOSIS — Z8249 Family history of ischemic heart disease and other diseases of the circulatory system: Secondary | ICD-10-CM | POA: Diagnosis not present

## 2022-08-07 DIAGNOSIS — Z888 Allergy status to other drugs, medicaments and biological substances status: Secondary | ICD-10-CM | POA: Diagnosis not present

## 2022-08-07 DIAGNOSIS — Z79899 Other long term (current) drug therapy: Secondary | ICD-10-CM | POA: Diagnosis not present

## 2022-08-07 DIAGNOSIS — Z95820 Peripheral vascular angioplasty status with implants and grafts: Secondary | ICD-10-CM

## 2022-08-07 DIAGNOSIS — R001 Bradycardia, unspecified: Secondary | ICD-10-CM | POA: Diagnosis not present

## 2022-08-07 DIAGNOSIS — Z90710 Acquired absence of both cervix and uterus: Secondary | ICD-10-CM

## 2022-08-07 DIAGNOSIS — Z981 Arthrodesis status: Secondary | ICD-10-CM

## 2022-08-07 DIAGNOSIS — F17210 Nicotine dependence, cigarettes, uncomplicated: Secondary | ICD-10-CM | POA: Diagnosis not present

## 2022-08-07 DIAGNOSIS — I6529 Occlusion and stenosis of unspecified carotid artery: Principal | ICD-10-CM

## 2022-08-07 DIAGNOSIS — Z4889 Encounter for other specified surgical aftercare: Secondary | ICD-10-CM

## 2022-08-07 DIAGNOSIS — Z9841 Cataract extraction status, right eye: Secondary | ICD-10-CM

## 2022-08-07 DIAGNOSIS — Z7984 Long term (current) use of oral hypoglycemic drugs: Secondary | ICD-10-CM | POA: Diagnosis not present

## 2022-08-07 DIAGNOSIS — Z9842 Cataract extraction status, left eye: Secondary | ICD-10-CM | POA: Diagnosis not present

## 2022-08-07 DIAGNOSIS — Z96641 Presence of right artificial hip joint: Secondary | ICD-10-CM | POA: Diagnosis present

## 2022-08-07 DIAGNOSIS — Z7982 Long term (current) use of aspirin: Secondary | ICD-10-CM | POA: Diagnosis not present

## 2022-08-07 DIAGNOSIS — Z7902 Long term (current) use of antithrombotics/antiplatelets: Secondary | ICD-10-CM

## 2022-08-07 DIAGNOSIS — E1151 Type 2 diabetes mellitus with diabetic peripheral angiopathy without gangrene: Secondary | ICD-10-CM | POA: Diagnosis present

## 2022-08-07 DIAGNOSIS — I2510 Atherosclerotic heart disease of native coronary artery without angina pectoris: Secondary | ICD-10-CM

## 2022-08-07 HISTORY — PX: ULTRASOUND GUIDANCE FOR VASCULAR ACCESS: SHX6516

## 2022-08-07 HISTORY — PX: TRANSCAROTID ARTERY REVASCULARIZATIONÂ: SHX6778

## 2022-08-07 LAB — GLUCOSE, CAPILLARY
Glucose-Capillary: 105 mg/dL — ABNORMAL HIGH (ref 70–99)
Glucose-Capillary: 102 mg/dL — ABNORMAL HIGH (ref 70–99)
Glucose-Capillary: 166 mg/dL — ABNORMAL HIGH (ref 70–99)
Glucose-Capillary: 308 mg/dL — ABNORMAL HIGH (ref 70–99)

## 2022-08-07 LAB — POCT ACTIVATED CLOTTING TIME
Activated Clotting Time: 146 seconds
Activated Clotting Time: 275 seconds

## 2022-08-07 SURGERY — TRANSCAROTID ARTERY REVASCULARIZATION (TCAR)
Anesthesia: General | Site: Neck | Laterality: Left

## 2022-08-07 MED ORDER — HEPARIN 6000 UNIT IRRIGATION SOLUTION
Status: AC
  Filled 2022-08-07: qty 500

## 2022-08-07 MED ORDER — EPHEDRINE SULFATE-NACL 50-0.9 MG/10ML-% IV SOSY
PREFILLED_SYRINGE | INTRAVENOUS | Status: DC | PRN
  Administered 2022-08-07 (×4): 5 mg via INTRAVENOUS

## 2022-08-07 MED ORDER — INSULIN ASPART 100 UNIT/ML IJ SOLN
0.0000 [IU] | Freq: Three times a day (TID) | INTRAMUSCULAR | Status: DC
  Administered 2022-08-07: 2 [IU] via SUBCUTANEOUS
  Administered 2022-08-08: 1 [IU] via SUBCUTANEOUS
  Administered 2022-08-08 (×2): 2 [IU] via SUBCUTANEOUS

## 2022-08-07 MED ORDER — PROTAMINE SULFATE 10 MG/ML IV SOLN
INTRAVENOUS | Status: AC
  Filled 2022-08-07: qty 5

## 2022-08-07 MED ORDER — DOCUSATE SODIUM 100 MG PO CAPS
100.0000 mg | ORAL_CAPSULE | Freq: Every day | ORAL | Status: DC
  Administered 2022-08-08: 100 mg via ORAL
  Filled 2022-08-07: qty 1

## 2022-08-07 MED ORDER — CLOPIDOGREL BISULFATE 75 MG PO TABS
75.0000 mg | ORAL_TABLET | Freq: Every morning | ORAL | Status: DC
  Administered 2022-08-08 – 2022-08-09 (×2): 75 mg via ORAL
  Filled 2022-08-07 (×2): qty 1

## 2022-08-07 MED ORDER — LACTATED RINGERS IV BOLUS
500.0000 mL | Freq: Once | INTRAVENOUS | Status: AC
  Administered 2022-08-07: 500 mL via INTRAVENOUS

## 2022-08-07 MED ORDER — ACETAMINOPHEN 325 MG PO TABS: 325.0000 mg | ORAL_TABLET | ORAL | Status: DC | PRN

## 2022-08-07 MED ORDER — DEXAMETHASONE SODIUM PHOSPHATE 10 MG/ML IJ SOLN
INTRAMUSCULAR | Status: AC
  Filled 2022-08-07: qty 1

## 2022-08-07 MED ORDER — INSULIN ASPART 100 UNIT/ML IJ SOLN: 0.0000 [IU] | INTRAMUSCULAR | Status: DC | PRN

## 2022-08-07 MED ORDER — PSEUDOEPHEDRINE HCL 15 MG/5ML PO LIQD
30.0000 mg | Freq: Four times a day (QID) | ORAL | Status: DC
  Administered 2022-08-07 – 2022-08-08 (×7): 30 mg via ORAL
  Filled 2022-08-07 (×9): qty 10

## 2022-08-07 MED ORDER — OXYCODONE HCL 5 MG PO TABS: 5.0000 mg | ORAL_TABLET | Freq: Four times a day (QID) | ORAL | Status: DC | PRN

## 2022-08-07 MED ORDER — PROPOFOL 10 MG/ML IV BOLUS
INTRAVENOUS | Status: DC | PRN
  Administered 2022-08-07: 100 ug/kg/min via INTRAVENOUS
  Administered 2022-08-07: 100 mg via INTRAVENOUS
  Administered 2022-08-07: 50 mg via INTRAVENOUS
  Administered 2022-08-07: 30 mg via INTRAVENOUS
  Administered 2022-08-07: 15 mg via INTRAVENOUS
  Administered 2022-08-07: 30 mg via INTRAVENOUS

## 2022-08-07 MED ORDER — PHENYLEPHRINE HCL-NACL 20-0.9 MG/250ML-% IV SOLN
INTRAVENOUS | Status: DC | PRN
  Administered 2022-08-07: 40 ug/min via INTRAVENOUS

## 2022-08-07 MED ORDER — ACETAMINOPHEN 160 MG/5ML PO SOLN: 325.0000 mg | ORAL | Status: DC | PRN

## 2022-08-07 MED ORDER — ACETAMINOPHEN 10 MG/ML IV SOLN: 1000.0000 mg | Freq: Once | INTRAVENOUS | Status: DC | PRN

## 2022-08-07 MED ORDER — PROTAMINE SULFATE 10 MG/ML IV SOLN
INTRAVENOUS | Status: DC | PRN
  Administered 2022-08-07: 50 mg via INTRAVENOUS

## 2022-08-07 MED ORDER — ACETAMINOPHEN 500 MG PO TABS
1000.0000 mg | ORAL_TABLET | Freq: Once | ORAL | Status: AC
  Administered 2022-08-07: 1000 mg via ORAL
  Filled 2022-08-07: qty 2

## 2022-08-07 MED ORDER — LIDOCAINE 2% (20 MG/ML) 5 ML SYRINGE
INTRAMUSCULAR | Status: AC
  Filled 2022-08-07: qty 5

## 2022-08-07 MED ORDER — PROPOFOL 10 MG/ML IV BOLUS
INTRAVENOUS | Status: AC
  Filled 2022-08-07: qty 20

## 2022-08-07 MED ORDER — PROMETHAZINE HCL 25 MG/ML IJ SOLN: 6.2500 mg | INTRAMUSCULAR | Status: DC | PRN

## 2022-08-07 MED ORDER — HYDRALAZINE HCL 20 MG/ML IJ SOLN: 5.0000 mg | INTRAMUSCULAR | Status: DC | PRN

## 2022-08-07 MED ORDER — ROCURONIUM BROMIDE 10 MG/ML (PF) SYRINGE
PREFILLED_SYRINGE | INTRAVENOUS | Status: DC | PRN
  Administered 2022-08-07 (×2): 10 mg via INTRAVENOUS
  Administered 2022-08-07: 50 mg via INTRAVENOUS

## 2022-08-07 MED ORDER — ASPIRIN 81 MG PO TBEC
81.0000 mg | DELAYED_RELEASE_TABLET | Freq: Every morning | ORAL | Status: DC
  Administered 2022-08-08 – 2022-08-09 (×2): 81 mg via ORAL
  Filled 2022-08-07 (×2): qty 1

## 2022-08-07 MED ORDER — OXYCODONE HCL 5 MG PO TABS: 5.0000 mg | ORAL_TABLET | Freq: Once | ORAL | Status: DC | PRN

## 2022-08-07 MED ORDER — ATORVASTATIN CALCIUM 80 MG PO TABS
80.0000 mg | ORAL_TABLET | Freq: Every day | ORAL | Status: DC
  Administered 2022-08-08 – 2022-08-09 (×2): 80 mg via ORAL
  Filled 2022-08-07 (×2): qty 1

## 2022-08-07 MED ORDER — MIDAZOLAM HCL (PF) 10 MG/2ML IJ SOLN
INTRAMUSCULAR | Status: AC
  Filled 2022-08-07: qty 2

## 2022-08-07 MED ORDER — CARVEDILOL PHOSPHATE ER 20 MG PO CP24: 20.0000 mg | ORAL_CAPSULE | Freq: Every day | ORAL | Status: DC

## 2022-08-07 MED ORDER — GLYCOPYRROLATE PF 0.2 MG/ML IJ SOSY
PREFILLED_SYRINGE | INTRAMUSCULAR | Status: DC | PRN
  Administered 2022-08-07 (×2): .1 mg via INTRAVENOUS

## 2022-08-07 MED ORDER — FENTANYL CITRATE (PF) 250 MCG/5ML IJ SOLN
INTRAMUSCULAR | Status: AC
  Filled 2022-08-07: qty 5

## 2022-08-07 MED ORDER — OXYCODONE-ACETAMINOPHEN 5-325 MG PO TABS
1.0000 | ORAL_TABLET | Freq: Four times a day (QID) | ORAL | Status: DC | PRN
  Administered 2022-08-07 – 2022-08-09 (×8): 1 via ORAL
  Filled 2022-08-07 (×8): qty 1

## 2022-08-07 MED ORDER — POLYETHYLENE GLYCOL 3350 17 G PO PACK: 17.0000 g | PACK | Freq: Every day | ORAL | Status: DC | PRN

## 2022-08-07 MED ORDER — EZETIMIBE 10 MG PO TABS
10.0000 mg | ORAL_TABLET | Freq: Every day | ORAL | Status: DC
  Administered 2022-08-08 – 2022-08-09 (×2): 10 mg via ORAL
  Filled 2022-08-07 (×2): qty 1

## 2022-08-07 MED ORDER — POTASSIUM CHLORIDE CRYS ER 20 MEQ PO TBCR
20.0000 meq | EXTENDED_RELEASE_TABLET | Freq: Every day | ORAL | Status: AC | PRN
  Administered 2022-08-08: 20 meq via ORAL
  Filled 2022-08-07: qty 1

## 2022-08-07 MED ORDER — BISACODYL 10 MG RE SUPP: 10.0000 mg | Freq: Every day | RECTAL | Status: DC | PRN

## 2022-08-07 MED ORDER — SUGAMMADEX SODIUM 200 MG/2ML IV SOLN
INTRAVENOUS | Status: DC | PRN
  Administered 2022-08-07: 200 mg via INTRAVENOUS

## 2022-08-07 MED ORDER — ONDANSETRON HCL 4 MG/2ML IJ SOLN
INTRAMUSCULAR | Status: AC
  Filled 2022-08-07: qty 2

## 2022-08-07 MED ORDER — VASOPRESSIN 20 UNIT/ML IV SOLN
INTRAVENOUS | Status: AC
  Filled 2022-08-07: qty 1

## 2022-08-07 MED ORDER — PSEUDOEPHEDRINE HCL 30 MG/5ML PO SYRP
30.0000 mg | ORAL_SOLUTION | Freq: Four times a day (QID) | ORAL | Status: DC
  Filled 2022-08-07: qty 5

## 2022-08-07 MED ORDER — SODIUM CHLORIDE 0.9 % IV SOLN: 500.0000 mL | Freq: Once | INTRAVENOUS | Status: DC | PRN

## 2022-08-07 MED ORDER — LIDOCAINE HCL (PF) 1 % IJ SOLN
INTRAMUSCULAR | Status: AC
  Filled 2022-08-07: qty 30

## 2022-08-07 MED ORDER — NOREPINEPHRINE 4 MG/250ML-% IV SOLN
2.0000 ug/min | INTRAVENOUS | Status: DC
  Administered 2022-08-07: 2 ug/min via INTRAVENOUS
  Filled 2022-08-07: qty 250

## 2022-08-07 MED ORDER — AMLODIPINE BESYLATE 10 MG PO TABS
10.0000 mg | ORAL_TABLET | Freq: Every morning | ORAL | Status: DC
  Administered 2022-08-08: 10 mg via ORAL
  Filled 2022-08-07: qty 1

## 2022-08-07 MED ORDER — PHENOL 1.4 % MT LIQD: 1.0000 | OROMUCOSAL | Status: DC | PRN

## 2022-08-07 MED ORDER — DOPAMINE-DEXTROSE 3.2-5 MG/ML-% IV SOLN
5.0000 ug/kg/min | INTRAVENOUS | Status: DC
  Administered 2022-08-07: 5 ug/kg/min via INTRAVENOUS
  Filled 2022-08-07: qty 250

## 2022-08-07 MED ORDER — SODIUM CHLORIDE 0.9 % IV SOLN: INTRAVENOUS | Status: DC

## 2022-08-07 MED ORDER — FENTANYL CITRATE (PF) 100 MCG/2ML IJ SOLN
INTRAMUSCULAR | Status: AC
  Filled 2022-08-07: qty 2

## 2022-08-07 MED ORDER — ALBUMIN HUMAN 5 % IV SOLN
12.5000 g | Freq: Once | INTRAVENOUS | Status: AC
  Administered 2022-08-07: 12.5 g via INTRAVENOUS

## 2022-08-07 MED ORDER — CARVEDILOL 6.25 MG PO TABS: 6.2500 mg | ORAL_TABLET | Freq: Two times a day (BID) | ORAL | Status: DC

## 2022-08-07 MED ORDER — ACETAMINOPHEN 650 MG RE SUPP: 325.0000 mg | RECTAL | Status: DC | PRN

## 2022-08-07 MED ORDER — LACTATED RINGERS IV SOLN: INTRAVENOUS | Status: DC

## 2022-08-07 MED ORDER — OXYCODONE-ACETAMINOPHEN 10-325 MG PO TABS: 1.0000 | ORAL_TABLET | Freq: Four times a day (QID) | ORAL | Status: DC | PRN

## 2022-08-07 MED ORDER — ALLOPURINOL 300 MG PO TABS
300.0000 mg | ORAL_TABLET | Freq: Every day | ORAL | Status: DC
  Administered 2022-08-08 – 2022-08-09 (×2): 300 mg via ORAL
  Filled 2022-08-07 (×2): qty 1

## 2022-08-07 MED ORDER — GABAPENTIN 300 MG PO CAPS
300.0000 mg | ORAL_CAPSULE | Freq: Three times a day (TID) | ORAL | Status: DC
  Administered 2022-08-07 – 2022-08-09 (×6): 300 mg via ORAL
  Filled 2022-08-07 (×6): qty 1

## 2022-08-07 MED ORDER — FENTANYL CITRATE (PF) 100 MCG/2ML IJ SOLN: 25.0000 ug | INTRAMUSCULAR | Status: DC | PRN

## 2022-08-07 MED ORDER — GUAIFENESIN-DM 100-10 MG/5ML PO SYRP: 15.0000 mL | ORAL_SOLUTION | ORAL | Status: DC | PRN

## 2022-08-07 MED ORDER — HEPARIN 6000 UNIT IRRIGATION SOLUTION
Status: DC | PRN
  Administered 2022-08-07: 1

## 2022-08-07 MED ORDER — SODIUM CHLORIDE 0.9 % IV SOLN: INTRAVENOUS | Status: DC | PRN

## 2022-08-07 MED ORDER — CHLORHEXIDINE GLUCONATE CLOTH 2 % EX PADS: 6.0000 | MEDICATED_PAD | Freq: Once | CUTANEOUS | Status: DC

## 2022-08-07 MED ORDER — PHENYLEPHRINE HCL-NACL 20-0.9 MG/250ML-% IV SOLN
25.0000 ug/min | INTRAVENOUS | Status: DC
  Administered 2022-08-07: 25 ug/min via INTRAVENOUS
  Filled 2022-08-07: qty 250

## 2022-08-07 MED ORDER — DEXAMETHASONE SODIUM PHOSPHATE 10 MG/ML IJ SOLN
INTRAMUSCULAR | Status: DC | PRN
  Administered 2022-08-07: 5 mg via INTRAVENOUS

## 2022-08-07 MED ORDER — FENTANYL CITRATE (PF) 250 MCG/5ML IJ SOLN
INTRAMUSCULAR | Status: DC | PRN
  Administered 2022-08-07 (×4): 50 ug via INTRAVENOUS

## 2022-08-07 MED ORDER — 0.9 % SODIUM CHLORIDE (POUR BTL) OPTIME
TOPICAL | Status: DC | PRN
  Administered 2022-08-07: 1000 mL

## 2022-08-07 MED ORDER — HEPARIN SODIUM (PORCINE) 1000 UNIT/ML IJ SOLN
INTRAMUSCULAR | Status: AC
  Filled 2022-08-07: qty 10

## 2022-08-07 MED ORDER — DOPAMINE-DEXTROSE 3.2-5 MG/ML-% IV SOLN
10.0000 ug/kg/min | INTRAVENOUS | Status: DC
  Filled 2022-08-07: qty 250

## 2022-08-07 MED ORDER — DOPAMINE-DEXTROSE 3.2-5 MG/ML-% IV SOLN
INTRAVENOUS | Status: AC
  Filled 2022-08-07: qty 250

## 2022-08-07 MED ORDER — IODIXANOL 320 MG/ML IV SOLN
INTRAVENOUS | Status: DC | PRN
  Administered 2022-08-07: 20 mL via INTRA_ARTERIAL

## 2022-08-07 MED ORDER — FENTANYL CITRATE (PF) 100 MCG/2ML IJ SOLN
25.0000 ug | INTRAMUSCULAR | Status: DC | PRN
  Administered 2022-08-07 (×2): 50 ug via INTRAVENOUS

## 2022-08-07 MED ORDER — ORAL CARE MOUTH RINSE: 15.0000 mL | Freq: Once | OROMUCOSAL | Status: AC

## 2022-08-07 MED ORDER — EPHEDRINE 5 MG/ML INJ
INTRAVENOUS | Status: AC
  Filled 2022-08-07: qty 5

## 2022-08-07 MED ORDER — ATROPINE SULFATE 0.4 MG/ML IV SOLN
INTRAVENOUS | Status: DC | PRN
  Administered 2022-08-07: .2 mg via INTRAVENOUS

## 2022-08-07 MED ORDER — PHENYLEPHRINE 80 MCG/ML (10ML) SYRINGE FOR IV PUSH (FOR BLOOD PRESSURE SUPPORT)
PREFILLED_SYRINGE | INTRAVENOUS | Status: DC | PRN
  Administered 2022-08-07: 80 ug via INTRAVENOUS
  Administered 2022-08-07: 160 ug via INTRAVENOUS
  Administered 2022-08-07: 80 ug via INTRAVENOUS
  Administered 2022-08-07 (×2): 160 ug via INTRAVENOUS

## 2022-08-07 MED ORDER — PHENYLEPHRINE 80 MCG/ML (10ML) SYRINGE FOR IV PUSH (FOR BLOOD PRESSURE SUPPORT)
PREFILLED_SYRINGE | INTRAVENOUS | Status: AC
  Filled 2022-08-07: qty 10

## 2022-08-07 MED ORDER — PANTOPRAZOLE SODIUM 40 MG PO TBEC
40.0000 mg | DELAYED_RELEASE_TABLET | Freq: Every day | ORAL | Status: DC
  Administered 2022-08-07 – 2022-08-09 (×3): 40 mg via ORAL
  Filled 2022-08-07 (×3): qty 1

## 2022-08-07 MED ORDER — HEMOSTATIC AGENTS (NO CHARGE) OPTIME
TOPICAL | Status: DC | PRN
  Administered 2022-08-07: 1 via TOPICAL

## 2022-08-07 MED ORDER — METOPROLOL TARTRATE 5 MG/5ML IV SOLN: 2.0000 mg | INTRAVENOUS | Status: DC | PRN

## 2022-08-07 MED ORDER — GLYCOPYRROLATE PF 0.2 MG/ML IJ SOSY
PREFILLED_SYRINGE | INTRAMUSCULAR | Status: AC
  Filled 2022-08-07: qty 1

## 2022-08-07 MED ORDER — ALBUMIN HUMAN 5 % IV SOLN
INTRAVENOUS | Status: AC
  Filled 2022-08-07: qty 250

## 2022-08-07 MED ORDER — ATROPINE SULFATE 0.4 MG/ML IV SOLN
INTRAVENOUS | Status: AC
  Filled 2022-08-07: qty 1

## 2022-08-07 MED ORDER — MAGNESIUM SULFATE 2 GM/50ML IV SOLN: 2.0000 g | Freq: Every day | INTRAVENOUS | Status: DC | PRN

## 2022-08-07 MED ORDER — ALUM & MAG HYDROXIDE-SIMETH 200-200-20 MG/5ML PO SUSP: 15.0000 mL | ORAL | Status: DC | PRN

## 2022-08-07 MED ORDER — ONDANSETRON HCL 4 MG/2ML IJ SOLN
INTRAMUSCULAR | Status: DC | PRN
  Administered 2022-08-07: 4 mg via INTRAVENOUS

## 2022-08-07 MED ORDER — CHLORHEXIDINE GLUCONATE CLOTH 2 % EX PADS
6.0000 | MEDICATED_PAD | Freq: Every day | CUTANEOUS | Status: DC
  Administered 2022-08-08 – 2022-08-09 (×2): 6 via TOPICAL

## 2022-08-07 MED ORDER — LABETALOL HCL 5 MG/ML IV SOLN: 10.0000 mg | INTRAVENOUS | Status: DC | PRN

## 2022-08-07 MED ORDER — AMISULPRIDE (ANTIEMETIC) 5 MG/2ML IV SOLN: 10.0000 mg | Freq: Once | INTRAVENOUS | Status: DC | PRN

## 2022-08-07 MED ORDER — CEFAZOLIN SODIUM-DEXTROSE 2-4 GM/100ML-% IV SOLN
2.0000 g | Freq: Three times a day (TID) | INTRAVENOUS | Status: AC
  Administered 2022-08-07 – 2022-08-08 (×2): 2 g via INTRAVENOUS
  Filled 2022-08-07 (×2): qty 100

## 2022-08-07 MED ORDER — LIDOCAINE 2% (20 MG/ML) 5 ML SYRINGE
INTRAMUSCULAR | Status: DC | PRN
  Administered 2022-08-07: 60 mg via INTRAVENOUS

## 2022-08-07 MED ORDER — MORPHINE SULFATE (PF) 2 MG/ML IV SOLN: 2.0000 mg | INTRAVENOUS | Status: DC | PRN

## 2022-08-07 MED ORDER — CHLORHEXIDINE GLUCONATE 0.12 % MT SOLN
15.0000 mL | Freq: Once | OROMUCOSAL | Status: AC
  Administered 2022-08-07: 15 mL via OROMUCOSAL
  Filled 2022-08-07: qty 15

## 2022-08-07 MED ORDER — LACTATED RINGERS IV SOLN: INTRAVENOUS | Status: DC | PRN

## 2022-08-07 MED ORDER — ROCURONIUM BROMIDE 10 MG/ML (PF) SYRINGE
PREFILLED_SYRINGE | INTRAVENOUS | Status: AC
  Filled 2022-08-07: qty 10

## 2022-08-07 MED ORDER — HEPARIN SODIUM (PORCINE) 1000 UNIT/ML IJ SOLN
INTRAMUSCULAR | Status: DC | PRN
  Administered 2022-08-07: 7000 [IU] via INTRAVENOUS

## 2022-08-07 MED ORDER — OXYCODONE HCL 5 MG/5ML PO SOLN: 5.0000 mg | Freq: Once | ORAL | Status: DC | PRN

## 2022-08-07 MED ORDER — ORAL CARE MOUTH RINSE: 15.0000 mL | OROMUCOSAL | Status: DC | PRN

## 2022-08-07 MED ORDER — ONDANSETRON HCL 4 MG/2ML IJ SOLN: 4.0000 mg | Freq: Four times a day (QID) | INTRAMUSCULAR | Status: DC | PRN

## 2022-08-07 SURGICAL SUPPLY — 51 items
ADH SKN CLS APL DERMABOND .7 (GAUZE/BANDAGES/DRESSINGS) ×2
ADH SKNCLS APL OCTYL .7 VIOL (GAUZE/BANDAGES/DRESSINGS) ×2
BAG BANDED W/RUBBER/TAPE 36X54 (MISCELLANEOUS) ×2 IMPLANT
BAG COUNTER SPONGE SURGICOUNT (BAG) ×2 IMPLANT
BAG EQP BAND 135X91 W/RBR TAPE (MISCELLANEOUS) ×1
BAG SPNG CNTER NS LX DISP (BAG) ×1
CANISTER SUCT 3000ML PPV (MISCELLANEOUS) ×2 IMPLANT
CATH BALLN ENROUTE 5X35 (CATHETERS) IMPLANT
CATH ROBINSON RED A/P 18FR (CATHETERS) IMPLANT
CLIP LIGATING EXTRA MED SLVR (CLIP) ×2 IMPLANT
CLIP LIGATING EXTRA SM BLUE (MISCELLANEOUS) ×2 IMPLANT
COVER DOME SNAP 22 D (MISCELLANEOUS) ×2 IMPLANT
COVER PROBE W GEL 5X96 (DRAPES) ×2 IMPLANT
DERMABOND ADVANCED .7 DNX12 (GAUZE/BANDAGES/DRESSINGS) ×2 IMPLANT
DRAPE FEMORAL ANGIO 80X135IN (DRAPES) ×2 IMPLANT
ELECT REM PT RETURN 9FT ADLT (ELECTROSURGICAL) ×1
ELECTRODE REM PT RTRN 9FT ADLT (ELECTROSURGICAL) ×2 IMPLANT
GLOVE BIO SURGEON STRL SZ7.5 (GLOVE) ×2 IMPLANT
GOWN STRL REUS W/ TWL LRG LVL3 (GOWN DISPOSABLE) ×4 IMPLANT
GOWN STRL REUS W/ TWL XL LVL3 (GOWN DISPOSABLE) ×2 IMPLANT
GOWN STRL REUS W/TWL LRG LVL3 (GOWN DISPOSABLE) ×2
GOWN STRL REUS W/TWL XL LVL3 (GOWN DISPOSABLE) ×1
GUIDEWIRE ENROUTE 0.014 (WIRE) ×2 IMPLANT
INTRODUCER KIT GALT 7CM (INTRODUCER) ×1
KIT BASIN OR (CUSTOM PROCEDURE TRAY) ×2 IMPLANT
KIT ENCORE 26 ADVANTAGE (KITS) ×2 IMPLANT
KIT INTRODUCER GALT 7 (INTRODUCER) ×2 IMPLANT
KIT TURNOVER KIT B (KITS) ×2 IMPLANT
NDL HYPO 25GX1X1/2 BEV (NEEDLE) IMPLANT
NEEDLE HYPO 25GX1X1/2 BEV (NEEDLE) IMPLANT
PACK CAROTID (CUSTOM PROCEDURE TRAY) ×2 IMPLANT
POSITIONER HEAD DONUT 9IN (MISCELLANEOUS) ×2 IMPLANT
POWDER SURGICEL 3.0 GRAM (HEMOSTASIS) IMPLANT
SET MICROPUNCTURE 5F STIFF (MISCELLANEOUS) ×2 IMPLANT
STENT TRANSCAROTID SYS 7X40 (Permanent Stent) IMPLANT
SUT MNCRL AB 4-0 PS2 18 (SUTURE) ×2 IMPLANT
SUT PROLENE 5 0 C 1 24 (SUTURE) ×2 IMPLANT
SUT SILK 2 0 PERMA HAND 18 BK (SUTURE) ×2 IMPLANT
SUT SILK 3 0 (SUTURE)
SUT SILK 3-0 18XBRD TIE 12 (SUTURE) IMPLANT
SUT VIC AB 3-0 SH 27 (SUTURE) ×1
SUT VIC AB 3-0 SH 27X BRD (SUTURE) ×2 IMPLANT
SYR 10ML LL (SYRINGE) ×6 IMPLANT
SYR 20ML LL LF (SYRINGE) ×2 IMPLANT
SYR CONTROL 10ML LL (SYRINGE) IMPLANT
SYSTEM TRANSCAROTID NEUROPRTCT (MISCELLANEOUS) ×2 IMPLANT
TOWEL GREEN STERILE (TOWEL DISPOSABLE) ×2 IMPLANT
TRANSCAROTID NEUROPROTECT SYS (MISCELLANEOUS) ×1
TUBE CONNECTING 20X1/4 (TUBING) IMPLANT
WATER STERILE IRR 1000ML POUR (IV SOLUTION) ×2 IMPLANT
WIRE BENTSON .035X145CM (WIRE) ×2 IMPLANT

## 2022-08-07 NOTE — Progress Notes (Signed)
  Day of Surgery Note    Subjective:  no complaints   Vitals:   08/07/22 1200 08/07/22 1215  BP: (!) 100/48 (!) 98/47  Pulse: 82 88  Resp: 14 13  Temp:    SpO2: 97% 95%    Incisions:   left neck and right groin are soft without hematoma Neuro:  moving all extremities equally; tongue is midline Lungs:  non labored    Assessment/Plan:  This is a 72 y.o. female who is s/p  Left TCAR for asymptomatic carotid artery stenosis  -pt doing well in recovery and neuro in tact.  Incisions look fine.  Her blood pressure is soft and has been started on Dopamine and sudafed.   -she is on asa/statin/plavix -SSI for DM -pt on Metformin.  Have not ordered this due to the fact she received IV contrast.  Comment in discharge instructions to hold until 6/13. -to 4 east later this afternoon. -pt on Oxycodone pre op and has pain contract-no pain meds to be written for at discharge.    Doreatha Massed, PA-C 08/07/2022 12:25 PM [phone]

## 2022-08-07 NOTE — Anesthesia Postprocedure Evaluation (Signed)
Anesthesia Post Note  Patient: April Shaffer  Procedure(s) Performed: Transcarotid Artery Revascularization (Left: Neck) ULTRASOUND GUIDANCE FOR VASCULAR ACCESS (Left: Neck)     Patient location during evaluation: PACU Anesthesia Type: General Level of consciousness: awake and alert Pain management: pain level controlled Vital Signs Assessment: post-procedure vital signs reviewed and stable Respiratory status: spontaneous breathing, nonlabored ventilation, respiratory function stable and patient connected to nasal cannula oxygen Cardiovascular status: blood pressure returned to baseline and stable Postop Assessment: no apparent nausea or vomiting Anesthetic complications: no  There were no known notable events for this encounter.  Last Vitals:  Vitals:   08/07/22 1415 08/07/22 1524  BP: (!) 96/48   Pulse: (!) 49   Resp: 12 19  Temp:    SpO2: 99%     Last Pain:  Vitals:   08/07/22 1524  TempSrc:   PainSc: 7                  Shelton Silvas

## 2022-08-07 NOTE — Anesthesia Procedure Notes (Addendum)

## 2022-08-07 NOTE — Op Note (Signed)
Patient name: April Shaffer MRN: [id number] DOB: 03-19-1950 Sex: female  08/07/2022 Pre-operative Diagnosis: Asymptomatic left internal carotid artery stenosis Post-operative diagnosis:  Same Surgeon:  [name] C. Randie Heinz, MD Assistant: Doreatha Massed, PA Procedure Performed: 1.  Ultrasound-guided cannulation right common femoral vein for placement of 8 French venous return sheath 2.  Left transcarotid artery stenting with 7 x 40 mm EnRoute stent with flow reversal neuro protection  Indications: 72 year old female with history of a right carotid endarterectomy that finally occluded post procedure now with high-grade left internal carotid artery stenosis that is asymptomatic but she is indicated for transcarotid artery stenting after discussing with her the options of medical therapy, carotid endarterectomy, transfemoral stenting for transcarotid stenting.  Experience assistant was necessary to facilitate exposure of the carotid artery as well as passage of wire, balloons and stents for stent placement.  Findings: The common carotid artery was calcified but there was a soft area proximally for clamping and for access.  The common carotid artery did appear calcified on the angiography and there was a proximal 90% stenosis at the carotid bifurcation that was stented to less than 10% stenosis at completion.  The patient was neurologically intact upon awakening from anesthesia.   Procedure:  The patient was identified in the holding area and taken to the operating where she is placed upon operative when general anesthesia was induced.  She was treated prepped and draped in the left neck and chest and bilateral groins in usual fashion, antibiotics were administered and a timeout was called.  We began using ultrasound to identify the right common femoral vein which was patent and compressible and cannulated with a micropuncture needle followed by wire and a sheath.  A Bentson wire was placed followed by a  8 [place] venous return sheath and this was affixed to the skin with silk suture.  Attention was then turned to the left neck where incision was made between the 2 ends of the sternocleidomastoid we dissected out initially it was lateral to the sternocleidomastoid of the vertebral artery and then used ultrasound to identify the common carotid artery and dissected through the 2 ends of the sternocleidomastoid retracted the IJ laterally identified the vagus nerve and protected this and placed umbilical tape and vessel loop in a Potts configuration of the proximal common carotid artery the patient was fully heparinized and ACT returned greater than 270.  A 5-0 Prolene U-stitch was then placed in the common carotid artery and this was cannulated with a micropuncture needle followed by wire up to 3 cm and sheath 3 cm.  Angiography was performed and the J-wire was placed to the carotid bifurcation and the flow reversal sheath was then placed under fluoroscopic guidance.  The wire was removed and the sheath was affixed to the skin in 2 locations and flow reversal was initiated and confirmed.  After TCAR timeout was performed across the very tightly stenosed internal carotid artery lesion with wire predilated this with a 5 mm balloon patient did have significant bradycardia at that time but recovered quickly with fluid was deflated.  We then primarily stented with a 7 x 40 mm and round stent.  After stenting we allowed 2 minutes of washout time and during this time we did rotate the image intensifier and identified no stenosis of the carotid artery.  We then performed angiography in 2 views which demonstrated flow into the external carotid artery with brisk flow to the internal carotid artery and remained hazy in the  common carotid artery but there appeared be no residual stenosis.  Satisfied with this we did remove the wire disconnected flow reversal.  The sheath was removed and the fibers of the suture was cinched.   Doppler demonstrated strong flow in the common carotid artery that was low resistance.  50 mg of protamine was administered.  Sheath was removed from the right groin pressure held for hemostasis was obtained.  We obtained stasis in the neck wound and irrigated and closed the platysma with Vicryl and the skin with Monocryl.  Dermabond placed at the skin level.  Patient was then awakened from anesthesia having tolerated the procedure without any complication.  Counts were correct at completion.   EBL: 25cc  Contrast: 20cc  Kosta Schnitzler C. Randie Heinz, MD [facility] Office: [phone] Pager: [phone]

## 2022-08-07 NOTE — H&P (Signed)
HPI: April Shaffer is a 72 y.o. (August 13, 1950) female who presents for routine follow up of carotid artery stenosis and PAD. She has history of right SFA stenting for rest pain. This was performed in June of 2023 by Dr. Randie Heinz with subsequent in stent restenosis requiring repeat intervention in August of 2023. She since has not had recurrent symptoms. She has remained on Aspirin, Statin and Plavix.    She has remote history of right CEA by Dr. Hart Rochester on 09/27/11. Her right carotid occluded silently after CEA. We have been following her left carotid artery by duplex. She has high grade asymptomatic stenosis. She was scheduled for left TCAR on 04/17/22 but on her pre op workup she was found to have leukocytosis and subsequently was diagnosed with infected left shoulder. She underwent Arthroscopy, I&D, distal clavicle excision, subacromial decompression, loose body removal and extensive debridement by Dr. Eulah Pont on 05/08/22.   Today she reports that her shoulder is feeling much better. She had to have PICC line for several weeks of antibiotics which are now complete.She has her last PT appointment this week. She denies any pain in her legs on ambulation or rest. No tissue loss. She denies any amaurosis fugax, slurred speech, facial drooping, unilateral upper or lower extremity weakness or numbness. She remains on her Aspirin, Statin, and Plavix       Past Medical History:  Diagnosis Date   Anemia     Arthritis     Blood transfusion without reported diagnosis     Carotid artery narrowing     Coronary artery disease      Cardiac catheterization November 2013: 50% ostial LAD stenosis 50% mid stenosis. 30% disease in the left circumflex.   Diabetes mellitus, type 2 (HCC)     Hyperlipidemia     Hypertension     Onychomycosis of toenail 07/31/2016   PAD (peripheral artery disease) (HCC)     Sleep apnea         Had surgery to correct   Thyroid nodule             Past Surgical History:  Procedure  Laterality Date   ABDOMINAL AORTOGRAM W/LOWER EXTREMITY N/A 08/07/2021    Procedure: ABDOMINAL AORTOGRAM W/LOWER EXTREMITY;  Surgeon: Maeola Harman, MD;  Location: [facility] INVASIVE CV LAB;  Service: Cardiovascular;  Laterality: N/A;   ABDOMINAL AORTOGRAM W/LOWER EXTREMITY Right 10/23/2021    Procedure: ABDOMINAL AORTOGRAM W/LOWER EXTREMITY;  Surgeon: Maeola Harman, MD;  Location: [facility] INVASIVE CV LAB;  Service: Cardiovascular;  Laterality: Right;   ABDOMINAL HYSTERECTOMY       APPLICATION OF INTRAOPERATIVE CT SCAN N/A 11/30/2020    Procedure: APPLICATION OF INTRAOPERATIVE CT SCAN;  Surgeon: Tia Alert, MD;  Location: [facility] OR;  Service: Neurosurgery;  Laterality: N/A;   BACK SURGERY       CARDIAC CATHETERIZATION       CATARACT EXTRACTION, BILATERAL Bilateral 2021    Dr. Joseph Art   ENDARTERECTOMY   09/27/2011    Procedure: RIGT ENDARTERECTOMY CAROTID;  Surgeon: Pryor Ochoa, MD;  Location: [facility] OR;  Service: Vascular;  Laterality: Right;   EPIDURAL BLOCK INJECTION   02/2008    Drs. Dorthula Nettles   gyn surgery   2004    total hysterectomy for mennorhagia,,salpingoophorectomy   INCISION AND DRAINAGE ABSCESS Left 05/08/2022    Procedure: ARTHROSCOPIC INCISION AND DRAINAGE ABSCESS, DISTAL CLAVICLE EXCISSION, SUBACROMIAL DECOMPRESSION, LOOSE BODY REMOVAL, EXTENSIVE DEBRIDEMENT;  Surgeon: Sheral Apley, MD;  Location: [facility]  ORS;  Service: Orthopedics;  Laterality: Left;   LAMINECTOMY WITH POSTERIOR LATERAL ARTHRODESIS LEVEL 2 N/A 11/30/2020    Procedure: LUMBAR FOUR-FIVE, LUMBAR FIVE-SACRAL ONE POSTERIOR LATERAL FUSION WITH REVISION OF LUMBAR ONE-FIVE HARDWARE AND EXTENSION TO SACRAL ONE AND SACRAL TWO;  Surgeon: Tia Alert, MD;  Location: [facility] OR;  Service: Neurosurgery;  Laterality: N/A;   LUMBAR WOUND DEBRIDEMENT N/A 05/25/2020    Procedure: LUMBAR WOUND IRRIGATION AND DEBRIDEMENT;  Surgeon: Tia Alert, MD;  Location: [facility] OR;  Service: Neurosurgery;  Laterality: N/A;    PERIPHERAL VASCULAR INTERVENTION   08/07/2021    Procedure: PERIPHERAL VASCULAR INTERVENTION;  Surgeon: Maeola Harman, MD;  Location: [facility] INVASIVE CV LAB;  Service: Cardiovascular;;  Rt SFA   PERIPHERAL VASCULAR INTERVENTION Right 10/23/2021    Procedure: PERIPHERAL VASCULAR INTERVENTION;  Surgeon: Maeola Harman, MD;  Location: [facility] INVASIVE CV LAB;  Service: Cardiovascular;  Laterality: Right;  SFA   POSTERIOR LUMBAR FUSION 4 LEVEL N/A 04/25/2020    Procedure: POSTERIOR LUMBAR INTERBODY FUSION LUMBAR ONE-TWO, LUMBAR TWO-THREE, LUMBAR THREE-FOUR,LUMBAR FOUR-FIVE.;  Surgeon: Tia Alert, MD;  Location: [facility] OR;  Service: Neurosurgery;  Laterality: N/A;  posterior   SHOULDER ARTHROSCOPY Left 05/08/2022    Procedure: ARTHROSCOPY SHOULDER;  Surgeon: Sheral Apley, MD;  Location: [facility] ORS;  Service: Orthopedics;  Laterality: Left;   TONSILLECTOMY       TOTAL ABDOMINAL HYSTERECTOMY W/ BILATERAL SALPINGOOPHORECTOMY Bilateral 2000   TOTAL HIP ARTHROPLASTY Right 08/22/2018    Procedure: TOTAL HIP ARTHROPLASTY;  Surgeon: Frederico Hamman, MD;  Location: [facility] ORS;  Service: Orthopedics;  Laterality: Right;   VARICOSE VEIN SURGERY   2008    stripping      Social History         Socioeconomic History   Marital status: Significant Other      Spouse name: Not on file   Number of children: 2   Years of education: 52   Highest education level: Not on file  Occupational History   Occupation: housekeeping-retired; 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      Comment: [employer]-retired 07/23/2014  Tobacco Use   Smoking status: Every Day      Packs/day: 0.25      Years: 50.00      Additional pack years: 0.00      Total pack years: 12.50      Types: Cigarettes      Start date: 05/31/1968      Passive exposure: Never   Smokeless tobacco: Never   Tobacco comments:      Smoked x50 years, 1 pack would last 3 days. Currently smoking 4-5 cigarettes per day  Vaping Use   Vaping Use:  Never used  Substance and Sexual Activity   Alcohol use: Yes      Alcohol/week: 1.0 standard drink of alcohol      Types: 1 Cans of beer per week      Comment: 1-2 drinks every other day   Drug use: No   Sexual activity: Not on file  Other Topics Concern   Not on file  Social History Narrative    Lives with long term boyfriend (30 years together)    Both sons live in [place]    Social Determinants of Health        Financial Resource Strain: Low Risk  (08/06/2018)    Overall Financial Resource Strain (CARDIA)     Difficulty of Paying Living Expenses: Not hard at all  Food  Insecurity: No Food Insecurity (05/08/2022)    Hunger Vital Sign     Worried About Running Out of Food in the Last Year: Never true     Ran Out of Food in the Last Year: Never true  Transportation Needs: No Transportation Needs (05/08/2022)    PRAPARE - Therapist, art (Medical): No     Lack of Transportation (Non-Medical): No  Physical Activity: Inactive (06/17/2017)    Exercise Vital Sign     Days of Exercise per Week: 0 days     Minutes of Exercise per Session: 0 min  Stress: No Stress Concern Present (06/17/2017)    [employer] of Occupational Health - Occupational Stress Questionnaire     Feeling of Stress : Not at all  Social Connections: Unknown (08/06/2018)    Social Connection and Isolation Panel [NHANES]     Frequency of Communication with Friends and Family: Not on file     Frequency of Social Gatherings with Friends and Family: Not on file     Attends Religious Services: Not on file     Active Member of Clubs or Organizations: Not on file     Attends Banker Meetings: Not on file     Marital Status: Living with partner  Intimate Partner Violence: Not At Risk (05/08/2022)    Humiliation, Afraid, Rape, and Kick questionnaire     Fear of Current or Ex-Partner: No     Emotionally Abused: No     Physically Abused: No     Sexually Abused: No            Family History  Problem Relation Age of Onset   Hypertension Sister     Hypothyroidism Sister     Cancer Maternal Grandmother          lung   Hypertension Son     Hypertension Sister     Diabetes Brother          lost toe in 2018 with new diagnosis of DM   Hypertension Son              Current Outpatient Medications  Medication Sig Dispense Refill   acetaminophen (TYLENOL) 500 MG tablet Take 500 mg by mouth 2 (two) times daily as needed for moderate pain.       allopurinol (ZYLOPRIM) 300 MG tablet Take 1 tablet (300 mg total) by mouth daily. (Patient taking differently: Take 300 mg by mouth daily as needed (gout).) 90 tablet 3   amLODipine (NORVASC) 10 MG tablet Take 10 mg by mouth in the morning.       aspirin EC 81 MG tablet Take 81 mg by mouth in the morning.       atorvastatin (LIPITOR) 40 MG tablet Take 1 tablet (40 mg total) by mouth daily. 30 tablet 0   carvedilol (COREG CR) 20 MG 24 hr capsule Take 1 capsule (20 mg total) by mouth daily. 90 capsule 3   clopidogrel (PLAVIX) 75 MG tablet Take 75 mg by mouth in the morning.       cyanocobalamin (VITAMIN B12) 1000 MCG tablet Take 1,000 mcg by mouth daily.       ezetimibe (ZETIA) 10 MG tablet TAKE 1 TABLET EVERY DAY (NEED MD APPOINTMENT) (Patient taking differently: Take 10 mg by mouth daily.) 90 tablet 0   ferrous sulfate 325 (65 FE) MG tablet Take 325 mg by mouth daily.       gabapentin (NEURONTIN) 300 MG capsule  Take 300 mg by mouth 3 (three) times daily.       glucose blood test strip Check sugars twice daily 100 each 11   metFORMIN (GLUCOPHAGE) 500 MG tablet Take 500 mg by mouth in the morning and at bedtime.       oxyCODONE-acetaminophen (PERCOCET) 10-325 MG tablet Take 1 tablet by mouth every 4 (four) hours as needed for pain. (Patient taking differently: Take 1 tablet by mouth every 6 (six) hours as needed for pain.) 40 tablet 0    No current facility-administered medications for this visit.           Allergies   Allergen Reactions   Zestoretic [Lisinopril-Hydrochlorothiazide] Swelling      Angioedema  - Tongue swelling    Codeine Itching   Oxycodone Itching      Patient is taking at this time   Ultram [Tramadol] Itching   Zocor [Simvastatin] Other (See Comments)      Muscle pain        REVIEW OF SYSTEMS:  [X]  denotes positive finding, [ ]  denotes negative finding Cardiac   Comments:  Chest pain or chest pressure:      Shortness of breath upon exertion:      Short of breath when lying flat:      Irregular heart rhythm:             Vascular      Pain in calf, thigh, or hip brought on by ambulation:      Pain in feet at night that wakes you up from your sleep:       Blood clot in your veins:      Leg swelling:              Pulmonary      Oxygen at home:      Productive cough:       Wheezing:              Neurologic      Sudden weakness in arms or legs:       Sudden numbness in arms or legs:       Sudden onset of difficulty speaking or slurred speech:      Temporary loss of vision in one eye:       Problems with dizziness:              Gastrointestinal      Blood in stool:       Vomited blood:              Genitourinary      Burning when urinating:       Blood in urine:             Psychiatric      Major depression:              Hematologic      Bleeding problems:      Problems with blood clotting too easily:             Skin      Rashes or ulcers:             Constitutional      Fever or chills:          PHYSICAL EXAMINATION:   Vitals:   08/07/22 0555  BP: (!) 116/52  Pulse: 64  Resp: 18  Temp: 97.8 F (36.6 C)  SpO2: 99%      General:  WDWN in NAD; vital signs  documented above Gait: Ambulates with cane HENT: WNL, normocephalic Pulmonary: normal non-labored breathing , without wheezing Cardiac: regular HR Abdomen: soft, NT, no masses Vascular Exam/Pulses: 2+ femoral pulses, 2+ right DP pulse, no palpable DP pulse on left. Feet warm and well  perfused Extremities: without ischemic changes, without Gangrene , without cellulitis; without open wounds Musculoskeletal: no muscle wasting or atrophy       Neurologic: A&O X 3 Psychiatric:  The pt has Normal affect.     Non-Invasive Vascular Imaging:   +-------+-----------+-----------+------------+------------+  ABI/TBIToday's ABIToday's TBIPrevious ABIPrevious TBI  +-------+-----------+-----------+------------+------------+  Right 0.90       0.73       1.05        0.74          +-------+-----------+-----------+------------+------------+  Left  0.87       0.52       1.04        0.65          +-------+-----------+-----------+------------+------------+  Biphasic flow on right with toe pressure of 113 mmHg Monophasic flow on left with toe pressure of 81 mmHg   VAS [place] Lower extremity Arterial duplex: Summary:  Right: Patent stents with no evidence of stenosis in the proximal and distal superficial femoral artery artery   ASSESSMENT/PLAN:: 72 y.o. female here for follow up for carotid artery stenosis and PAD. Plan for L TCAR today in the OR. All risks, benefits and alternatives again discussed. She remains on aspirin, plavix and statin.   Mylin Gignac C. Randie Heinz, MD [facility] [place] Office: [phone] Pager: [phone]

## 2022-08-07 NOTE — Transfer of Care (Signed)
Immediate Anesthesia Transfer of Care Note  Patient: April Shaffer  Procedure(s) Performed: Transcarotid Artery Revascularization (Left: Neck) ULTRASOUND GUIDANCE FOR VASCULAR ACCESS (Left: Neck)  Patient Location: PACU  Anesthesia Type:General  Level of Consciousness: awake, alert , and oriented  Airway & Oxygen Therapy: Patient Spontanous Breathing  Post-op Assessment: Report given to RN, Post -op Vital signs reviewed and stable, and Patient moving all extremities X 4  Post vital signs: Reviewed and stable  Last Vitals:  Vitals Value Taken Time  BP 92/66 08/07/22 0941  Temp    Pulse 76 08/07/22 0943  Resp 17 08/07/22 0943  SpO2 96 % 08/07/22 0943  Vitals shown include unvalidated device data.  Last Pain:  Vitals:   08/07/22 0621  TempSrc:   PainSc: 0-No pain      Patients Stated Pain Goal: 0 (08/07/22 1610)  Complications: There were no known notable events for this encounter.

## 2022-08-07 NOTE — Progress Notes (Signed)
Randie Heinz MD and Doreatha Massed PA notified of swelling around left neck site and bradycardia. See new orders.

## 2022-08-07 NOTE — Discharge Instructions (Signed)
   [facility]  Discharge Instructions   Carotid Surgery  Please refer to the following instructions for your post-procedure care. Your surgeon or physician assistant will discuss any changes with you.  Activity  You are encouraged to walk as much as you can. You can slowly return to normal activities but must avoid strenuous activity and heavy lifting until your doctor tell you it's okay. Avoid activities such as vacuuming or swinging a golf club. You can drive after one week if you are comfortable and you are no longer taking prescription pain medications. It is normal to feel tired for serval weeks after your surgery. It is also normal to have difficulty with sleep habits, eating, and bowel movements after surgery. These will go away with time.  Bathing/Showering  Shower daily after you go home. Do not soak in a bathtub, hot tub, or swim until the incision heals completely.  Incision Care  Shower every day. Clean your incision with mild soap and water. Pat the area dry with a clean towel. You do not need a bandage unless otherwise instructed. Do not apply any ointments or creams to your incision. You may have skin glue on your incision. Do not peel it off. It will come off on its own in about one week. Your incision may feel thickened and raised for several weeks after your surgery. This is normal and the skin will soften over time.   For Men Only: It's okay to shave around the incision but do not shave the incision itself for 2 weeks. It is common to have numbness under your chin that could last for several months.  Diet  Resume your normal diet. There are no special food restrictions following this procedure. A low fat/low cholesterol diet is recommended for all patients with vascular disease. In order to heal from your surgery, it is CRITICAL to get adequate nutrition. Your body requires vitamins, minerals, and protein. Vegetables are the best source of  vitamins and minerals. Vegetables also provide the perfect balance of protein. Processed food has little nutritional value, so try to avoid this.  Medications  Resume taking all of your medications unless your doctor or physician assistant tells you not to. If your incision is causing pain, you may take over-the- counter pain relievers such as acetaminophen (Tylenol). If you were prescribed a stronger pain medication, please be aware these medications can cause nausea and constipation. Prevent nausea by taking the medication with a snack or meal. Avoid constipation by drinking plenty of fluids and eating foods with a high amount of fiber, such as fruits, vegetables, and grains.  Do not take Tylenol if you are taking prescription pain medications.  Hold your Metformin for two days.  Restart on 08/09/2022.  Follow Up  Our office will schedule a follow up appointment 2-3 weeks following discharge.  Please call us immediately for any of the following conditions  Increased pain, redness, drainage (pus) from your incision site. Fever of 101 degrees or higher. If you should develop stroke (slurred speech, difficulty swallowing, weakness on one side of your body, loss of vision) you should call 911 and go to the nearest emergency room.  Reduce your risk of vascular disease:  Stop smoking. If you would like help call [facility] at 1-800-QUIT-NOW (440 836 6003) or [place] at 531-329-4808. Manage your cholesterol Maintain a desired weight Control your diabetes Keep your blood pressure down  If you have any questions, please call the office at [phone].

## 2022-08-07 NOTE — Anesthesia Procedure Notes (Signed)
Arterial Line Insertion Start/End6/12/2022 7:05 AM, 08/07/2022 7:10 AM Performed by: Shelton Silvas, MD, Sharyn Dross, CRNA, CRNA  Patient location: Pre-op. Preanesthetic checklist: patient identified, IV checked, site marked, risks and benefits discussed, surgical consent, monitors and equipment checked, pre-op evaluation, timeout performed and anesthesia consent Lidocaine 1% used for infiltration Left, radial was placed Catheter size: 20 G Hand hygiene performed  and maximum sterile barriers used   Attempts: 1 Procedure performed without using ultrasound guided technique. Following insertion, dressing applied. Post procedure assessment: normal and unchanged  Patient tolerated the procedure well with no immediate complications.

## 2022-08-07 NOTE — Progress Notes (Addendum)
Per unit RN ivWatch is alarming on existing USGPIV and pt is complaining of soreness at site. New USGPIV placed in left forearm.  An USGPIV (ultrasound guided PIV) has been placed for short-term vasopressor infusion. A correctly placed ivWatch must be used when administering vasopressors. Should this treatment be needed beyond 72 hours, central line access should be obtained. It will be the responsibility of the bedside nurse to follow best practice to prevent extravasations.

## 2022-08-08 ENCOUNTER — Encounter (HOSPITAL_COMMUNITY): Payer: Self-pay | Admitting: Vascular Surgery

## 2022-08-08 LAB — GLUCOSE, CAPILLARY
Glucose-Capillary: 154 mg/dL — ABNORMAL HIGH (ref 70–99)
Glucose-Capillary: 135 mg/dL — ABNORMAL HIGH (ref 70–99)
Glucose-Capillary: 162 mg/dL — ABNORMAL HIGH (ref 70–99)
Glucose-Capillary: 149 mg/dL — ABNORMAL HIGH (ref 70–99)

## 2022-08-08 LAB — BASIC METABOLIC PANEL
Anion gap: 8 (ref 5–15)
BUN: 17 mg/dL (ref 8–23)
CO2: 19 mmol/L — ABNORMAL LOW (ref 22–32)
Calcium: 8 mg/dL — ABNORMAL LOW (ref 8.9–10.3)
Chloride: 107 mmol/L (ref 98–111)
Creatinine, Ser: 1.01 mg/dL — ABNORMAL HIGH (ref 0.44–1.00)
GFR, Estimated: 60 mL/min — ABNORMAL LOW (ref >60)
Glucose, Bld: 191 mg/dL — ABNORMAL HIGH (ref 70–99)
Potassium: 3.7 mmol/L (ref 3.5–5.1)
Sodium: 134 mmol/L — ABNORMAL LOW (ref 135–145)

## 2022-08-08 LAB — CBC
HCT: 28 % — ABNORMAL LOW (ref 36.0–46.0)
Hemoglobin: 8.7 g/dL — ABNORMAL LOW (ref 12.0–15.0)
MCH: 26.3 pg (ref 26.0–34.0)
MCHC: 31.1 g/dL (ref 30.0–36.0)
MCV: 84.6 fL (ref 80.0–100.0)
Platelets: 253 10*3/uL (ref 150–400)
RBC: 3.31 MIL/uL — ABNORMAL LOW (ref 3.87–5.11)
RDW: 17 % — ABNORMAL HIGH (ref 11.5–15.5)
WBC: 11 10*3/uL — ABNORMAL HIGH (ref 4.0–10.5)
nRBC: 0 % (ref 0.0–0.2)

## 2022-08-08 MED ORDER — PSEUDOEPHEDRINE HCL 30 MG PO TABS
30.0000 mg | ORAL_TABLET | Freq: Four times a day (QID) | ORAL | Status: DC
  Filled 2022-08-08 (×2): qty 1

## 2022-08-08 NOTE — Progress Notes (Addendum)
  Progress Note    08/08/2022 6:36 AM 1 Day Post-Op  Subjective:  sitting up in bed.  No complaints except mild soreness around neck incision.  Says she walked this morning without any problems.  Denies any dizziness.   Afebrile HR 40's-50's  120's-140's systolic 93% RA  Gtts:   Vitals:   08/08/22 0300 08/08/22 0630  BP:    Pulse: (!) 44   Resp: 13   Temp:  (!) 97.4 F (36.3 C)  SpO2: 92%      Physical Exam: Neuro:  in tact; tongue is midline Lungs:  non labored Incision:  clean and in tact with mild fullness that is unchanged from yesterday afternoon.  CBC    Component Value Date/Time   WBC 11.0 (H) 08/08/2022 0558   RBC 3.31 (L) 08/08/2022 0558   HGB 8.7 (L) 08/08/2022 0558   HGB 9.9 (L) 07/11/2022 1024   HGB 12.6 08/04/2019 0849   HCT 28.0 (L) 08/08/2022 0558   HCT 28.6 (L) 08/21/2021 1506   PLT 253 08/08/2022 0558   PLT 427 (H) 07/11/2022 1024   PLT 259 08/04/2019 0849   MCV 84.6 08/08/2022 0558   MCV 91 08/04/2019 0849   MCH 26.3 08/08/2022 0558   MCHC 31.1 08/08/2022 0558   RDW 17.0 (H) 08/08/2022 0558   RDW 14.1 08/04/2019 0849   LYMPHSABS 3.1 07/11/2022 1024   LYMPHSABS 2.6 08/04/2019 0849   MONOABS 0.8 07/11/2022 1024   EOSABS 0.2 07/11/2022 1024   EOSABS 0.2 08/04/2019 0849   BASOSABS 0.1 07/11/2022 1024   BASOSABS 0.1 08/04/2019 0849    BMET    Component Value Date/Time   NA 134 (L) 08/08/2022 0558   NA 142 08/04/2019 0849   K 3.7 08/08/2022 0558   CL 107 08/08/2022 0558   CO2 19 (L) 08/08/2022 0558   GLUCOSE 191 (H) 08/08/2022 0558   BUN 17 08/08/2022 0558   BUN 15 08/04/2019 0849   CREATININE 1.01 (H) 08/08/2022 0558   CREATININE 0.77 08/21/2021 1221   CREATININE 0.78 12/27/2020 1108   CALCIUM 8.0 (L) 08/08/2022 0558   GFRNONAA 60 (L) 08/08/2022 0558   GFRNONAA >60 08/21/2021 1221   GFRAA 82 08/04/2019 0849     Intake/Output Summary (Last 24 hours) at 08/08/2022 0636 Last data filed at 08/07/2022 1900 Gross per 24 hour   Intake 2026.62 ml  Output 50 ml  Net 1976.62 ml     Assessment/Plan:  This is a 72 y.o. female who is s/p left TCAR  1 Day Post-Op  -pt is doing well this am.  She is off Cleviprex.  Hold BP meds until pressure rebounds. -pt neuro exam is in tact -transfer to 4 east.  If no events today, most likely discharge home tomorrow.  -restart Metformin tomorrow due to receiving IV contrast yesterday. -message has been sent to office for follow up.   Doreatha Massed, PA-C [facility] [phone]   I have independently interviewed and examined patient and agree with PA assessment and plan above.  Blood pressure improved off of drips, okay for transfer to 4 E.  Amado Andal C. Randie Heinz, MD [facility] [place] Office: [phone] Pager: [phone]

## 2022-08-08 NOTE — TOC Initial Note (Addendum)
Transition of Care [facility]) - Initial/Assessment Note    Patient Details  Name: April Shaffer MRN: [id number] Date of Birth: Oct 25, 1950  Transition of Care [facility]) CM/SW Contact:    April Cousin, RN Phone Number: [phone] 08/08/2022, 3:28 PM  Clinical Narrative:   CM spoke to pt at bedside. Offered choice for [facility]. Pt states she had [facility] in the past for [facility]. She is requesting [facility]. Notified [facility] pt is requesting [facility]. She lives at home with SO. Will need HHRN and PT orders with F2F.  States SO will provide transportation home and he transports her to appts. Pt reports having cane, RW, tub bench and 3n1 bedside commode at home.           Expected Discharge Plan: Home w Home Health Services Barriers to Discharge: Continued Medical Work up   Patient Goals and CMS Choice Patient states their goals for this hospitalization and ongoing recovery are:: wants to remain independent CMS Medicare.gov Compare Post Acute Care list provided to:: Patient Choice offered to / list presented to : Patient      Expected Discharge Plan and Services   Discharge Planning Services: CM Consult Post Acute Care Choice: Home Health Living arrangements for the past 2 months: Single Family Home                           HH Arranged: RN HH Agency: [facility] Date [facility] Agency Contacted: 08/08/22 Time HH Agency Contacted: 1527 Representative spoke with at [facility] Agency: Cindee  Prior Living Arrangements/Services Living arrangements for the past 2 months: Single Family Home Lives with:: Significant Other Patient language and need for interpreter reviewed:: Yes Do you feel safe going back to the place where you live?: Yes      Need for Family Participation in Patient Care: No (Comment) Care giver support system in place?: Yes (comment)   Criminal Activity/Legal Involvement Pertinent to Current Situation/Hospitalization: No - Comment as needed  Activities of Daily Living       Permission Sought/Granted Permission sought to share information with : Case Manager, Family Supports, PCP Permission granted to share information with : Yes, Verbal Permission Granted  Share Information with NAME: April Shaffer  Permission granted to share info w AGENCY: Home Health  Permission granted to share info w Relationship: SO  Permission granted to share info w Contact Information: [phone]  Emotional Assessment Appearance:: Appears stated age Attitude/Demeanor/Rapport: Engaged Affect (typically observed): Accepting Orientation: : Oriented to Self, Oriented to Place, Oriented to  Time, Oriented to Situation   Psych Involvement: No (comment)  Admission diagnosis:  Carotid artery stenosis [I65.29] Asymptomatic carotid artery stenosis without infarction, left [I65.22] Observation after surgery [Z48.89] Patient Active Problem List   Diagnosis Date Noted   Asymptomatic carotid artery stenosis without infarction, left 08/07/2022   Observation after surgery 08/07/2022   Septic arthritis of shoulder (HCC) 05/07/2022   Anemia of chronic disease 04/10/2022   Lumbar pseudoarthrosis 01/24/2022   Occlusion and stenosis of right carotid artery 01/24/2022   Iron deficiency anemia secondary to blood loss (chronic) 01/08/2022   B12 deficiency 08/31/2021   Medication monitoring encounter 12/12/2020   Nausea 12/12/2020   Hammer toes of both feet 11/09/2020   Acute maxillary sinusitis 08/08/2020   Hyperlipidemia 08/08/2020   Pain due to onychomycosis of toenails of both feet 08/08/2020   PICC (peripherally inserted central catheter) in place 06/22/2020   Therapeutic drug monitoring 06/22/2020  Wound infection after surgery 05/25/2020   Wound drainage 05/24/2020   Local infection of the skin and subcutaneous tissue, unspecified 05/24/2020   Body mass index (BMI) 27.0-27.9, adult 05/10/2020   S/P lumbar fusion 04/25/2020   Microalbuminuria due to type 2 diabetes mellitus (HCC)  08/07/2019   Primary localized osteoarthritis of right hip 08/22/2018   Carpal tunnel syndrome, bilateral 08/06/2018   Type 2 diabetes mellitus with peripheral neuropathy (HCC) 08/06/2018   Degenerative joint disease of right hip 08/06/2018   Preoperative clearance 03/26/2018   Upper back pain on left side 10/30/2016   Encounter for postoperative carotid endarterectomy surveillance 12/07/2015   Varicosities of leg 06/03/2015   Acute left lumbar radiculopathy 04/15/2014   Chronic sciatica of right side 03/17/2013   Diabetic peripheral neuropathy (HCC) 03/17/2013   Coronary artery disease    Carotid artery stenosis s/p CEA 2013 10/16/2011   Carotid artery occlusion without infarction, right 09/17/2011   Carotid bruit 08/28/2011   Gout 04/13/2011   DM type 2 with diabetic peripheral neuropathy (HCC) 04/02/2008   Hyperlipidemia associated with type 2 diabetes mellitus (HCC) 04/02/2008   Essential hypertension 04/02/2008   DEGENERATIVE DISC DISEASE, LUMBOSACRAL SPINE 04/02/2008   PCP:  Raymon Mutton., FNP Pharmacy:   [facility] [address], [place] - [address] [place] [address] Phone: [phone] Fax: [phone]  [facility] - [place], [place] - [address] [place] [address] Phone: [phone] Fax: [phone]  [name] [address] [place] [address] Phone: [phone] Fax: [phone]     Social Determinants of Health (SDOH) Social History: SDOH Screenings   Food Insecurity: No Food Insecurity (05/08/2022)  Housing: Low Risk  (05/08/2022)  Transportation Needs: No Transportation Needs (05/08/2022)  Utilities: Not At Risk (05/08/2022)  Depression (PHQ2-9): Low Risk  (07/05/2022)  Financial Resource Strain: Low Risk  (08/06/2018)  Physical Activity: Inactive (06/17/2017)  Social Connections: Unknown (08/06/2018)  Stress: No  Stress Concern Present (06/17/2017)  Tobacco Use: High Risk (08/07/2022)   SDOH Interventions:     Readmission Risk Interventions    05/10/2022    3:52 PM  Readmission Risk Prevention Plan  Transportation Screening Complete  PCP or Specialist Appt within 3-5 Days Complete  HRI or Home Care Consult Complete  Social Work Consult for Recovery Care Planning/Counseling Complete  Palliative Care Screening Not Applicable  Medication Review Oceanographer) Complete

## 2022-08-09 LAB — BASIC METABOLIC PANEL
Anion gap: 5 (ref 5–15)
BUN: 16 mg/dL (ref 8–23)
CO2: 21 mmol/L — ABNORMAL LOW (ref 22–32)
Calcium: 7.9 mg/dL — ABNORMAL LOW (ref 8.9–10.3)
Chloride: 111 mmol/L (ref 98–111)
Creatinine, Ser: 1.05 mg/dL — ABNORMAL HIGH (ref 0.44–1.00)
GFR, Estimated: 57 mL/min — ABNORMAL LOW (ref >60)
Glucose, Bld: 146 mg/dL — ABNORMAL HIGH (ref 70–99)
Potassium: 3.6 mmol/L (ref 3.5–5.1)
Sodium: 137 mmol/L (ref 135–145)

## 2022-08-09 LAB — CBC
HCT: 25.8 % — ABNORMAL LOW (ref 36.0–46.0)
Hemoglobin: 8.1 g/dL — ABNORMAL LOW (ref 12.0–15.0)
MCH: 26.6 pg (ref 26.0–34.0)
MCHC: 31.4 g/dL (ref 30.0–36.0)
MCV: 84.6 fL (ref 80.0–100.0)
Platelets: 230 10*3/uL (ref 150–400)
RBC: 3.05 MIL/uL — ABNORMAL LOW (ref 3.87–5.11)
RDW: 17.3 % — ABNORMAL HIGH (ref 11.5–15.5)
WBC: 10.3 10*3/uL (ref 4.0–10.5)
nRBC: 0 % (ref 0.0–0.2)

## 2022-08-09 LAB — GLUCOSE, CAPILLARY: Glucose-Capillary: 89 mg/dL (ref 70–99)

## 2022-08-09 MED ORDER — PSEUDOEPHEDRINE HCL 30 MG PO TABS
30.0000 mg | ORAL_TABLET | Freq: Four times a day (QID) | ORAL | Status: DC
  Administered 2022-08-09: 30 mg via ORAL
  Filled 2022-08-09: qty 1

## 2022-08-09 MED FILL — Potassium Chloride Inj 2 mEq/ML: INTRAVENOUS | Qty: 40 | Status: AC

## 2022-08-09 MED FILL — Heparin Sodium (Porcine) Inj 1000 Unit/ML: Qty: 1000 | Status: AC

## 2022-08-09 MED FILL — Magnesium Sulfate Inj 50%: INTRAMUSCULAR | Qty: 10 | Status: AC

## 2022-08-09 NOTE — Progress Notes (Addendum)
[facility] [place]  Subjective  - doing well over    Objective (!) 110/56 (!) 55 98.2 F (36.8 C) (!) 21 96%  Intake/Output Summary (Last 24 hours) at 08/09/2022 0731 Last data filed at 08/09/2022 0600 Gross per 24 hour  Intake 665.68 ml  Output --  Net 665.68 ml    Moving all 4 ext, no facial droop or tongue deviation Palpable radial pulses Left neck incision healing Systolic 105-110   Assessment/Planning: POD # 2 left TCAR She has tolerated ambulation and maintained BP 105-110 HGB 8.1 today asymptomatic Will hold hypertensive medications and have her f/u with her PCP. F/U with [facility] in 2-3 weeks for incision check  Mosetta Pigeon 08/09/2022 7:31 AM --  Laboratory Lab Results: Recent Labs    08/08/22 0558 08/09/22 0052  WBC 11.0* 10.3  HGB 8.7* 8.1*  HCT 28.0* 25.8*  PLT 253 230   BMET Recent Labs    08/08/22 0558 08/09/22 0052  NA 134* 137  K 3.7 3.6  CL 107 111  CO2 19* 21*  GLUCOSE 191* 146*  BUN 17 16  CREATININE 1.01* 1.05*  CALCIUM 8.0* 7.9*    COAG Lab Results  Component Value Date   INR 1.1 08/01/2022   INR 1.1 04/12/2022   INR 1.0 11/28/2020   No results found for: "PTT"  I have independently interviewed and examined patient and agree with PA assessment and plan above.   Jasmeet Gehl C. Randie Heinz, MD [facility] [place] Office: [phone] Pager: [phone]

## 2022-08-10 ENCOUNTER — Telehealth: Payer: Self-pay | Admitting: Physician Assistant

## 2022-08-10 NOTE — Telephone Encounter (Signed)
Appt has been scheduled.

## 2022-08-10 NOTE — Discharge Summary (Signed)
[facility] Discharge Summary   Patient ID:  April Shaffer MRN: [id number] DOB/AGE: [birth year] 70 y.o.  Admit date: 08/07/2022     Discharge date: 08/09/22 Date of Surgery: 08/07/2022 Surgeon: Surgeon(s): Maeola Harman, MD  Admission Diagnosis: Carotid artery stenosis [I65.29] Asymptomatic carotid artery stenosis without infarction, left [I65.22] Observation after surgery [Z48.89]  Discharge Diagnoses:  Carotid artery stenosis [I65.29] Asymptomatic carotid artery stenosis without infarction, left [I65.22] Observation after surgery [Z48.89]  Secondary Diagnoses: Past Medical History:  Diagnosis Date   Anemia    Arthritis    Blood transfusion without reported diagnosis    Carotid artery narrowing    Coronary artery disease    Cardiac catheterization November 2013: 50% ostial LAD stenosis 50% mid stenosis. 30% disease in the left circumflex.   Diabetes mellitus, type 2 (HCC)    Hyperlipidemia    Hypertension    Onychomycosis of toenail 07/31/2016   PAD (peripheral artery disease) (HCC)    Sleep apnea       Had surgery to correct   Thyroid nodule     Procedure(s): Transcarotid Artery Revascularization ULTRASOUND GUIDANCE FOR VASCULAR ACCESS  Discharged Condition: stable     Hospital Course:  April Shaffer is a 72 y.o. female is S/P  Procedure(s): Left Transcarotid Artery Revascularization ULTRASOUND GUIDANCE FOR VASCULAR ACCESS This was done for asymptomatic left ICA high grade stenosis.   She developed hypotension post op.  Her blood pressure is soft and has been started on Dopamine and sudafed.   -she is on asa/statin/plavix.  Post op day 2 she tolerated ambulation and maintained her BP 105-110.  Will hold hypertensive medications and have her f/u with her PCP. F/U with [facility] in 2-3 weeks for incision check.  Neck incision healing well and no neurologic deficits.     Significant Diagnostic Studies: CBC Lab Results  Component  Value Date   WBC 10.3 08/09/2022   HGB 8.1 (L) 08/09/2022   HCT 25.8 (L) 08/09/2022   MCV 84.6 08/09/2022   PLT 230 08/09/2022    BMET    Component Value Date/Time   NA 137 08/09/2022 0052   NA 142 08/04/2019 0849   K 3.6 08/09/2022 0052   CL 111 08/09/2022 0052   CO2 21 (L) 08/09/2022 0052   GLUCOSE 146 (H) 08/09/2022 0052   BUN 16 08/09/2022 0052   BUN 15 08/04/2019 0849   CREATININE 1.05 (H) 08/09/2022 0052   CREATININE 0.77 08/21/2021 1221   CREATININE 0.78 12/27/2020 1108   CALCIUM 7.9 (L) 08/09/2022 0052   GFRNONAA 57 (L) 08/09/2022 0052   GFRNONAA >60 08/21/2021 1221   GFRAA 82 08/04/2019 0849   COAG Lab Results  Component Value Date   INR 1.1 08/01/2022   INR 1.1 04/12/2022   INR 1.0 11/28/2020     Disposition:  Discharge to :Home Discharge Instructions     Call MD for:  redness, tenderness, or signs of infection (pain, swelling, bleeding, redness, odor or green/yellow discharge around incision site)   Complete by: As directed    Call MD for:  severe or increased pain, loss or decreased feeling  in affected limb(s)   Complete by: As directed    Call MD for:  temperature >100.5   Complete by: As directed    Discharge instructions   Complete by: As directed    Hold Norvasc and Carvedilol   Resume previous diet   Complete by: As directed       Allergies as of  08/09/2022       Reactions   Zestoretic [lisinopril-hydrochlorothiazide] Swelling   Angioedema  - Tongue swelling    Codeine Itching   Oxycodone Itching   Patient is taking at this time   Ultram [tramadol] Itching   Zocor [simvastatin] Other (See Comments)   Muscle pain        Medication List     STOP taking these medications    amLODipine 10 MG tablet Commonly known as: NORVASC   carvedilol 20 MG 24 hr capsule Commonly known as: COREG CR       TAKE these medications    acetaminophen 500 MG tablet Commonly known as: TYLENOL Take 500 mg by mouth every 6 (six) hours as  needed for moderate pain.   allopurinol 300 MG tablet Commonly known as: ZYLOPRIM Take 1 tablet (300 mg total) by mouth daily.   aspirin EC 81 MG tablet Take 81 mg by mouth in the morning.   atorvastatin 80 MG tablet Commonly known as: LIPITOR Take 80 mg by mouth daily.   clopidogrel 75 MG tablet Commonly known as: PLAVIX Take 75 mg by mouth in the morning.   cyanocobalamin 1000 MCG tablet Commonly known as: VITAMIN B12 Take 1,000 mcg by mouth daily.   ezetimibe 10 MG tablet Commonly known as: ZETIA TAKE 1 TABLET EVERY DAY (NEED MD APPOINTMENT) What changed: See the new instructions.   ferrous sulfate 325 (65 FE) MG tablet Take 325 mg by mouth daily.   gabapentin 300 MG capsule Commonly known as: NEURONTIN Take 300 mg by mouth 3 (three) times daily.   glucose blood test strip Check sugars twice daily   metFORMIN 500 MG tablet Commonly known as: GLUCOPHAGE Take 500 mg by mouth in the morning and at bedtime.   oxyCODONE-acetaminophen 10-325 MG tablet Commonly known as: PERCOCET Take 1 tablet by mouth every 4 (four) hours as needed for pain. What changed: when to take this       Verbal and written Discharge instructions given to the patient. Wound care per Discharge AVS  Follow-up Information     [facility] Follow up in 4 week(s).   Specialty: Vascular Surgery Why: Office will call you to arrange your appt (sent). Contact information: [address] [place] [address] [phone]        Raymon Mutton., FNP Follow up.   Specialty: Family Medicine Why: please call to arrange appt with PCP in 1-2 weeks. Contact information: [address] [place] [address] [phone]         Care, [facility] Follow up.   Specialty: Home Health Services Why: Home Health RN and Physical Therapy- agency will call to arrange appt Contact information: [address] [place]  [address] [phone]                 Signed: Mosetta Pigeon 08/10/2022, 9:08 PM --- For VQI Registry use --- Instructions: Press F2 to tab through selections.  Delete question if not applicable.   Modified Rankin score at D/C (0-6): Rankin Score=0  IV medication needed for:  1. Hypertension: No 2. Hypotension: Yes  Post-op Complications: No  1. Post-op CVA or TIA: No  If yes: Event classification (right eye, left eye, right cortical, left cortical, verterobasilar, other):   If yes: Timing of event (intra-op, <6 hrs post-op, >=6 hrs post-op, unknown):   2. CN injury: No  If yes: CN  injuried  3. Myocardial infarction: No  If yes: Dx by (EKG or clinical, Troponin):   4.  CHF: No  5.  Dysrhythmia (new): No  6. Wound infection: No  7. Reperfusion symptoms: No  8. Return to OR: No  If yes: return to OR for (bleeding, neurologic, other CEA incision, other):   Discharge medications: Statin use:  Yes ASA use:  Yes Beta blocker use:  No  for medical reason not indicated ACE-Inhibitor use:  No  for medical reason   P2Y12 Antagonist use: [ ]  None, [ x] Plavix, [ ]  Plasugrel, [ ]  Ticlopinine, [ ]  Ticagrelor, [ ]  Other, [ ]  No for medical reason, [ ]  Non-compliant, [ ]  Not-indicated Anti-coagulant use:  [ x] None, [ ]  Warfarin, [ ]  Rivaroxaban, [ ]  Dabigatran, [ ]  Other, [ ]  No for medical reason, [ ]  Non-compliant, [ ]  Not-indicated

## 2022-08-10 NOTE — Telephone Encounter (Signed)
-----   Message from Dara Lords, [place] sent at 08/08/2022  9:04 AM EDT ----- S/p left TCAR 6/11.  F/u in 4 weeks with carotid duplex on Dr. Randie Heinz clinic day.  Thanks

## 2022-08-13 ENCOUNTER — Telehealth: Payer: Self-pay | Admitting: *Deleted

## 2022-08-13 NOTE — Telephone Encounter (Signed)
TOC CM received call from Dr Darcella Cheshire nurse, Harriett Sine. HH RN and HH PT orders and F2F were not entered prior to dc. [facility] rep contacted orders for [facility]. Reviewed chart and orders were not entered. States she will follow up on getting orders to [facility] agency. Isidoro Donning RN3 CCM, Heart Failure TOC CM [phone]

## 2022-08-20 ENCOUNTER — Other Ambulatory Visit: Payer: Self-pay | Admitting: *Deleted

## 2022-08-20 DIAGNOSIS — I6523 Occlusion and stenosis of bilateral carotid arteries: Secondary | ICD-10-CM

## 2022-09-03 ENCOUNTER — Ambulatory Visit (HOSPITAL_COMMUNITY)
Admission: RE | Admit: 2022-09-03 | Discharge: 2022-09-03 | Disposition: A | Discharge status: Home / Self Care | Payer: Medicare HMO | Source: Ambulatory Visit | Attending: Surgery | Admitting: Surgery

## 2022-09-03 DIAGNOSIS — I6523 Occlusion and stenosis of bilateral carotid arteries: Secondary | ICD-10-CM | POA: Insufficient documentation

## 2022-09-04 ENCOUNTER — Other Ambulatory Visit: Payer: Self-pay | Admitting: Family

## 2022-09-04 DIAGNOSIS — Z1231 Encounter for screening mammogram for malignant neoplasm of breast: Secondary | ICD-10-CM

## 2022-09-04 NOTE — Progress Notes (Unsigned)
POST OPERATIVE OFFICE NOTE    CC:  F/u for surgery  HPI:  This is a 72 y.o. female who is s/p left TCAR on 08/07/22 by Dr. Randie Heinz. This was performed for asymptomatic high grade left ICA stenosis. She did have a little post operative neck swelling as well as bradycardiac. She was initially monitored for 1 day in ICU but remained stable for transfer to the floor. She remained stable and neurologically intact and was discharged home POD#2.   Pt returns today for follow up.  Pt states she has been doing great . She denies any visual changes, slurred speech, trouble talking, unilateral upper or lower extremity weakness or numbness.  She has known occlusion of her right ICA. She does not have any claudication, rest pain or tissue loss. She is medically managed on Aspirin, Statin, Plavix  Allergies  Allergen Reactions   Zestoretic [Lisinopril-Hydrochlorothiazide] Swelling    Angioedema  - Tongue swelling    Codeine Itching   Oxycodone Itching    Patient is taking at this time   Ultram [Tramadol] Itching   Zocor [Simvastatin] Other (See Comments)    Muscle pain    Current Outpatient Medications  Medication Sig Dispense Refill   acetaminophen (TYLENOL) 500 MG tablet Take 500 mg by mouth every 6 (six) hours as needed for moderate pain.     allopurinol (ZYLOPRIM) 300 MG tablet Take 1 tablet (300 mg total) by mouth daily. 90 tablet 3   aspirin EC 81 MG tablet Take 81 mg by mouth in the morning.     atorvastatin (LIPITOR) 80 MG tablet Take 80 mg by mouth daily.     clopidogrel (PLAVIX) 75 MG tablet Take 75 mg by mouth in the morning.     cyanocobalamin (VITAMIN B12) 1000 MCG tablet Take 1,000 mcg by mouth daily.     ezetimibe (ZETIA) 10 MG tablet TAKE 1 TABLET EVERY DAY (NEED MD APPOINTMENT) (Patient taking differently: Take 10 mg by mouth daily.) 90 tablet 0   ferrous sulfate 325 (65 FE) MG tablet Take 325 mg by mouth daily.     gabapentin (NEURONTIN) 300 MG capsule Take 300 mg by mouth 3 (three)  times daily.     glucose blood test strip Check sugars twice daily 100 each 11   metFORMIN (GLUCOPHAGE) 500 MG tablet Take 500 mg by mouth in the morning and at bedtime.     oxyCODONE-acetaminophen (PERCOCET) 10-325 MG tablet Take 1 tablet by mouth every 4 (four) hours as needed for pain. (Patient taking differently: Take 1 tablet by mouth every 6 (six) hours as needed for pain.) 40 tablet 0   No current facility-administered medications for this visit.     ROS:  See HPI  Physical Exam:  Vitals:   09/05/22 1022 09/05/22 1025  BP: (!) 144/80 (!) 152/79  Pulse: 84 84  Resp: 14   Temp: (!) 97.4 F (36.3 C)   SpO2: 91%    General: well appearing, well nourished, very pleasant, Cardiac: regular Lungs: non labored Incision:  left neck incision has healed very nicely Extremities:  moving extremities without deficits. 2+ radial pulses, 2+ Femoral and DP pulses bilaterally Neuro: alert and oriented. Speech coherent Abdomen:  soft  Non invasive studies: Right Carotid Findings:  +----------+--------+--------+--------+------------------+--------+           PSV cm/sEDV cm/sStenosisPlaque DescriptionComments  +----------+--------+--------+--------+------------------+--------+  CCA Prox  73  heterogenous                +----------+--------+--------+--------+------------------+--------+  CCA Distal72                      heterogenous                +----------+--------+--------+--------+------------------+--------+  ICA Prox                  Occluded                            +----------+--------+--------+--------+------------------+--------+  ICA Mid                   Occluded                            +----------+--------+--------+--------+------------------+--------+  ICA Distal                Occluded                            +----------+--------+--------+--------+------------------+--------+  ECA      103                                                  +----------+--------+--------+--------+------------------+--------+   +----------+--------+-------+--------+-------------------+           PSV cm/sEDV cmsDescribeArm Pressure (mmHG)  +----------+--------+-------+--------+-------------------+  [id number]                   140                  +----------+--------+-------+--------+-------------------+   +---------+--------+--+--------+--+---------+  VertebralPSV cm/s60EDV cm/s13Antegrade  +---------+--------+--+--------+--+---------+   Left Carotid Findings:  +----------+--------+--------+--------+------------------+--------+           PSV cm/sEDV cm/sStenosisPlaque DescriptionComments  +----------+--------+--------+--------+------------------+--------+  CCA Prox  89      22              heterogenous                +----------+--------+--------+--------+------------------+--------+  CCA Distal79      26              heterogenous                +----------+--------+--------+--------+------------------+--------+  ICA Prox                                            stent     +----------+--------+--------+--------+------------------+--------+  ICA Mid                                             stent     +----------+--------+--------+--------+------------------+--------+  ICA Distal122     39                                          +----------+--------+--------+--------+------------------+--------+  ECA      93                                                  +----------+--------+--------+--------+------------------+--------+   +----------+--------+--------+--------+-------------------+  PSV cm/sEDV cm/sDescribeArm Pressure (mmHG)  +----------+--------+--------+--------+-------------------+  Subclavian161                    130                  +----------+--------+--------+--------+-------------------+    +---------+--------+--+--------+--+---------+  VertebralPSV cm/s70EDV cm/s23Antegrade  +---------+--------+--+--------+--+---------+   Left Stent(s):  +---------------+---+--++++  Prox to Stent  86 32  +---------------+---+--++++  Proximal Stent 95 29  +---------------+---+--++++  Mid Stent      93 29  +---------------+---+--++++  Distal Stent   10132  +---------------+---+--++++  Distal to [id number]  +---------------+---+--++++   Summary:  Right Carotid: Evidence consistent with a total occlusion of the right ICA.   Left Carotid: Patent stent without evidence of stenosis.   Vertebrals:  Bilateral vertebral arteries demonstrate antegrade flow.  Subclavians: Normal flow hemodynamics were seen in bilateral subclavian arteries.    Assessment/Plan:  This is a 72 y.o. female who is s/p: left TCAR on 08/07/22 by Dr. Randie Heinz. She is doing very well since surgery. Incision has healed very nicely. She is without any new TIA or stroke like symptoms - Duplex shows known occluded right ICA. Patent left ICA s/p stenting. Normal flow in both vertebral and subclavian arteries bilaterally - Continue Aspirin, Statin, Plavix  - She will follow up again in 9 months with carotid duplex - She has follow up in November for her PAD with non invasive studies. She will keep this follow up    Nathanial Rancher, [facility] [phone]   Clinic MD:  Randie Heinz

## 2022-09-05 ENCOUNTER — Ambulatory Visit (INDEPENDENT_AMBULATORY_CARE_PROVIDER_SITE_OTHER): Payer: Medicare HMO | Admitting: Physician Assistant

## 2022-09-05 ENCOUNTER — Encounter: Payer: Self-pay | Admitting: Physician Assistant

## 2022-09-05 VITALS — BP 152/79 | HR 84 | Temp 97.4°F | Resp 14 | Ht 63.0 in | Wt 134.0 lb

## 2022-09-05 DIAGNOSIS — I6522 Occlusion and stenosis of left carotid artery: Secondary | ICD-10-CM

## 2022-09-17 ENCOUNTER — Ambulatory Visit: Payer: Medicare HMO | Admitting: Podiatry

## 2022-09-17 DIAGNOSIS — M79675 Pain in left toe(s): Secondary | ICD-10-CM

## 2022-09-17 DIAGNOSIS — I739 Peripheral vascular disease, unspecified: Secondary | ICD-10-CM | POA: Diagnosis not present

## 2022-09-17 DIAGNOSIS — M79674 Pain in right toe(s): Secondary | ICD-10-CM | POA: Diagnosis not present

## 2022-09-17 DIAGNOSIS — L84 Corns and callosities: Secondary | ICD-10-CM

## 2022-09-17 DIAGNOSIS — B351 Tinea unguium: Secondary | ICD-10-CM

## 2022-09-17 DIAGNOSIS — E1142 Type 2 diabetes mellitus with diabetic polyneuropathy: Secondary | ICD-10-CM

## 2022-09-19 ENCOUNTER — Other Ambulatory Visit: Payer: Self-pay

## 2022-09-19 DIAGNOSIS — I6523 Occlusion and stenosis of bilateral carotid arteries: Secondary | ICD-10-CM

## 2022-09-19 DIAGNOSIS — I739 Peripheral vascular disease, unspecified: Secondary | ICD-10-CM

## 2022-09-20 ENCOUNTER — Ambulatory Visit
Admission: RE | Admit: 2022-09-20 | Discharge: 2022-09-20 | Disposition: A | Discharge status: Home / Self Care | Payer: Medicare HMO | Source: Ambulatory Visit | Attending: Family | Admitting: Family

## 2022-09-20 DIAGNOSIS — Z1231 Encounter for screening mammogram for malignant neoplasm of breast: Secondary | ICD-10-CM

## 2022-09-22 ENCOUNTER — Encounter: Payer: Self-pay | Admitting: Podiatry

## 2022-09-22 NOTE — Progress Notes (Signed)
  Subjective:  Patient ID: April Shaffer, female    DOB: 1951/01/10,  MRN: [id number]  April Shaffer presents to clinic today for at risk footcare. Patient has h/o diabetes, neuropathy and PAD and is seen for  and callus(es) left foot and painful thick toenails that are difficult to trim. Painful toenails interfere with ambulation. Aggravating factors include wearing enclosed shoe gear. Pain is relieved with periodic professional debridement. Painful calluses are aggravated when weightbearing with and without shoegear. Pain is relieved with periodic professional debridement.  Chief Complaint  Patient presents with   Nail Problem    Dfc,Referring Provider Raymon Mutton., FNP,lov:1-2 months ago      New problem(s): None.   PCP is Raymon Mutton., FNP.  Allergies  Allergen Reactions   Zestoretic [Lisinopril-Hydrochlorothiazide] Swelling    Angioedema  - Tongue swelling    Codeine Itching   Oxycodone Itching    Patient is taking at this time   Ultram [Tramadol] Itching   Zocor [Simvastatin] Other (See Comments)    Muscle pain    Review of Systems: Negative except as noted in the HPI.  Objective: No changes noted in today's physical examination. There were no vitals filed for this visit. April Shaffer is a pleasant 72 y.o. female WD, WN in NAD. AAO x 3.  Vascular Examination: CFT <4 seconds b/l. DP pulses diminished RLE. PT pulses diminished RLE. Pedal pulses are faintly palpable LLE. Digital hair absent. Skin temperature gradient warm to cool b/l. No ischemia or gangrene. No cyanosis or clubbing noted b/l.    Neurological Examination: Protective sensation decreased with 10 gram monofilament b/l. Vibratory sensation decreased b/l.  Dermatological Examination: Pedal skin thin, shiny and atrophic b/l. No open wounds. No interdigital macerations.   Toenails 1-5 b/l thick, discolored, elongated with subungual debris and pain on dorsal palpation.   Subungual  longitudinal melanonychia noted left hallux nail and left 2nd digit. No extension into proximal nailfold.  Hyperkeratotic lesion(s) left great toe.  No erythema, no edema, no drainage, no fluctuance.  Musculoskeletal Examination: Normal muscle strength 5/5 to all lower extremity muscle groups bilaterally. No pain, crepitus or joint limitation noted with ROM b/l LE. No gross bony pedal deformities b/l. Patient ambulates with cane assistance.  Radiographs: None  Last A1c:      Latest Ref Rng & Units 08/01/2022    9:30 AM 04/12/2022    9:39 AM  Hemoglobin A1C  Hemoglobin-A1c 4.8 - 5.6 % 6.0  6.4    Assessment/Plan: 1. Pain due to onychomycosis of toenails of both feet   2. Callus   3. PAD (peripheral artery disease) (HCC)   4. DM type 2 with diabetic peripheral neuropathy (HCC)     -Patient was evaluated and treated. All patient's and/or POA's questions/concerns answered on today's visit. -Continue supportive shoe gear daily. -Toenails 1-5 b/l were debrided in length and girth with sterile nail nippers and dremel without iatrogenic bleeding.  -Callus(es) left great toe pared utilizing sterile scalpel blade without complication or incident. Total number debrided =1. -Patient/POA to call should there be question/concern in the interim.   Return in about 3 months (around 12/18/2022).  Freddie Breech, DPM

## 2022-10-10 ENCOUNTER — Other Ambulatory Visit: Payer: Self-pay

## 2022-10-10 DIAGNOSIS — E538 Deficiency of other specified B group vitamins: Secondary | ICD-10-CM

## 2022-10-10 DIAGNOSIS — D638 Anemia in other chronic diseases classified elsewhere: Secondary | ICD-10-CM

## 2022-10-10 DIAGNOSIS — D500 Iron deficiency anemia secondary to blood loss (chronic): Secondary | ICD-10-CM

## 2022-10-11 ENCOUNTER — Other Ambulatory Visit: Payer: Self-pay

## 2022-10-11 ENCOUNTER — Inpatient Hospital Stay: Payer: Medicare HMO | Attending: Nurse Practitioner

## 2022-10-11 ENCOUNTER — Inpatient Hospital Stay: Payer: Medicare HMO

## 2022-10-11 ENCOUNTER — Inpatient Hospital Stay (HOSPITAL_BASED_OUTPATIENT_CLINIC_OR_DEPARTMENT_OTHER): Payer: Medicare HMO | Admitting: Hematology

## 2022-10-11 VITALS — BP 142/61 | HR 82 | Temp 98.2°F | Resp 16 | Ht 63.0 in | Wt 136.3 lb

## 2022-10-11 DIAGNOSIS — D638 Anemia in other chronic diseases classified elsewhere: Secondary | ICD-10-CM

## 2022-10-11 DIAGNOSIS — E538 Deficiency of other specified B group vitamins: Secondary | ICD-10-CM

## 2022-10-11 DIAGNOSIS — D508 Other iron deficiency anemias: Secondary | ICD-10-CM | POA: Insufficient documentation

## 2022-10-11 DIAGNOSIS — D500 Iron deficiency anemia secondary to blood loss (chronic): Secondary | ICD-10-CM

## 2022-10-11 LAB — CBC WITH DIFFERENTIAL (CANCER CENTER ONLY)
Abs Immature Granulocytes: 0.06 10*3/uL (ref 0.00–0.07)
Basophils Absolute: 0.1 10*3/uL (ref 0.0–0.1)
Basophils Relative: 1 %
Eosinophils Absolute: 0.1 10*3/uL (ref 0.0–0.5)
Eosinophils Relative: 1 %
HCT: 35.7 % — ABNORMAL LOW (ref 36.0–46.0)
Hemoglobin: 11.4 g/dL — ABNORMAL LOW (ref 12.0–15.0)
Immature Granulocytes: 1 %
Lymphocytes Relative: 16 %
Lymphs Abs: 1.6 10*3/uL (ref 0.7–4.0)
MCH: 27 pg (ref 26.0–34.0)
MCHC: 31.9 g/dL (ref 30.0–36.0)
MCV: 84.6 fL (ref 80.0–100.0)
Monocytes Absolute: 0.9 10*3/uL (ref 0.1–1.0)
Monocytes Relative: 8 %
Neutro Abs: 7.7 10*3/uL (ref 1.7–7.7)
Neutrophils Relative %: 73 %
Platelet Count: 308 10*3/uL (ref 150–400)
RBC: 4.22 MIL/uL (ref 3.87–5.11)
RDW: 18 % — ABNORMAL HIGH (ref 11.5–15.5)
WBC Count: 10.4 10*3/uL (ref 4.0–10.5)
nRBC: 0 % (ref 0.0–0.2)

## 2022-10-11 LAB — IRON AND IRON BINDING CAPACITY (CC-WL,HP ONLY)
Iron: 25 ug/dL — ABNORMAL LOW (ref 28–170)
Saturation Ratios: 10 % — ABNORMAL LOW (ref 10.4–31.8)
TIBC: 251 ug/dL (ref 250–450)
UIBC: 226 ug/dL (ref 148–442)

## 2022-10-11 LAB — VITAMIN B12: Vitamin B-12: 682 pg/mL (ref 180–914)

## 2022-10-11 LAB — FERRITIN: Ferritin: 211 ng/mL (ref 11–307)

## 2022-10-11 MED ORDER — CYANOCOBALAMIN 1000 MCG/ML IJ SOLN
1000.0000 ug | Freq: Once | INTRAMUSCULAR | Status: AC
  Administered 2022-10-11: 1000 ug via INTRAMUSCULAR
  Filled 2022-10-11: qty 1

## 2022-10-11 NOTE — Assessment & Plan Note (Signed)
-  previous lab showed low B12 and elevated MMA which confirms B12 deficiency.   -intrinsic factor from 08/31/21 was normal  -she began B12 injections on 08/31/21, weekly through 10/11/21 and now on monthly. Last dose in 12/2021 -She had a multiple hospital admission since January 2024, and missed her B12 injections, her anemia is much worse now with Hg in 8's

## 2022-10-11 NOTE — Patient Instructions (Signed)
 Vitamin B12 Capsules or Tablets What is this medication? VITAMIN B12 (VAHY tuh min B12) prevents and treats low vitamin B12 levels in your body. It is used in people who do not get enough vitamin B12 from their diet or when their digestive tract does not absorb enough. Vitamin B12 plays an important role in maintaining the health of your nervous system and red blood cells. This medicine may be used for other purposes; ask your health care provider or pharmacist if you have questions. What should I tell my care team before I take this medication? They need to know if you have any of these conditions: Anemia Kidney disease Leber's disease Malabsorption disorder An unusual or allergic reaction to cyanocobalamin, cobalt, other medications, foods, dyes, or preservatives Pregnant or trying to get pregnant Breast-feeding How should I use this medication? Take this medication by mouth with a glass of water. Follow the directions on the package or prescription label. If you are taking the tablets, do not chew, cut, or crush this medication. If using a vitamin solution, use a specially marked spoon or dropper to measure each dose. Ask your pharmacist if you do not have one. Household spoons are not accurate. For best results take this vitamin with food. Take your medication at regular intervals. Do not take your medication more often than directed. Talk to your care team about the use of this medication in children. While this medication may be prescribed for selected conditions, precautions do apply. Overdosage: If you think you have taken too much of this medicine contact a poison control center or emergency room at once. NOTE: This medicine is only for you. Do not share this medicine with others. What if I miss a dose? If you miss a dose, take it as soon as you can. If it is almost time for your next dose, take only that dose. Do not take double or extra doses. What may interact with this  medication? Alcohol Aminosalicylic acid Colchicine Medications that suppress your bone marrow, such as chemotherapy, chloramphenicol This list may not describe all possible interactions. Give your health care provider a list of all the medicines, herbs, non-prescription drugs, or dietary supplements you use. Also tell them if you smoke, drink alcohol, or use illegal drugs. Some items may interact with your medicine. What should I watch for while using this medication? Follow a healthy diet. Taking a vitamin supplement does not replace the need for a balanced diet. Some foods that have vitamin B12 naturally are fish, seafood, egg yolk, milk, and fermented cheese. Too much of this vitamin can be unsafe. Talk to your care team about how much is right for you. What side effects may I notice from receiving this medication? Side effects that you should report to your care team as soon as possible: Allergic reactions--skin rash, itching, hives, swelling of the face, lips, tongue, or throat Side effects that usually do not require medical attention (report to your care team if they continue or are bothersome): Diarrhea Fatigue Headache Nausea This list may not describe all possible side effects. Call your doctor for medical advice about side effects. You may report side effects to FDA at 1-800-FDA-1088. Where should I keep my medication? Keep out of the reach of children and pets. Store at room temperature between 15 and 30 degrees C (59 and 85 degrees F). Protect from heat and light. Throw away any unused medication after the expiration date. NOTE: This sheet is a summary. It may not cover all  possible information. If you have questions about this medicine, talk to your doctor, pharmacist, or health care provider.  2024 Elsevier/Gold Standard (2020-10-25 00:00:00)

## 2022-10-11 NOTE — Progress Notes (Signed)
[facility]   Telephone:(336) [phone] Fax:(336) [phone]   Clinic Follow up Note   Patient Care Team: Raymon Mutton., FNP as PCP - General (Family Medicine) Runell Gess, MD as PCP - Cardiology (Cardiology) Frederico Hamman, MD as Consulting Physician (Orthopedic Surgery) Malachy Mood, MD as Consulting Physician (Hematology) Pollyann Samples, NP as Nurse Practitioner (Nurse Practitioner) Raymon Mutton., FNP as Nurse Practitioner (Family Medicine) Arman Bogus, MD as Consulting Physician (Neurosurgery)  Date of Service:  10/11/2022  CHIEF COMPLAINT: f/u of anemia   CURRENT THERAPY:  B12 injections, starting 08/31/21, currently monthly Iv venofer as needed   ASSESSMENT:  April Shaffer is a 72 y.o. female with   B12 deficiency -previous lab showed low B12 and elevated MMA which confirms B12 deficiency.   -intrinsic factor from 08/31/21 was normal  -she began B12 injections on 08/31/21, weekly through 10/11/21 and now on monthly. Last dose in 12/2021 -She had a multiple hospital admission since January 2024, and missed her B12 injections, her anemia is much worse now with Hg in 8's  -She agrees to restart a B12 injection, and continue monthly in my office.  Anemia of chronic disease -iron panel 08/01/21 indicative of anemia of chronic disease rather than true iron deficiency  --iron on 12/06/21 was 19. She received 3 doses IV Venofer from 12/22/21.  She responded well, hemoglobin improved to 11.8 after IV iron.  -Will continue monitoring her iron level.  PLAN: -Recommend to continue B 12 injection monthly, if level is low it will be  weekly -lab in 3 months  - f/u in 6 months -lab reviewed    INTERVAL HISTORY:  April Shaffer is here for a follow up of anemia. She was last seen by me on 04/11/2022. She presents to the clinic alone. Pt was hospitalize and had a stent place in her carotid artery. Pt state that she is in an exercise class.    All other  systems were reviewed with the patient and are negative.  MEDICAL HISTORY:  Past Medical History:  Diagnosis Date   Anemia    Arthritis    Blood transfusion without reported diagnosis    Carotid artery narrowing    Coronary artery disease    Cardiac catheterization November 2013: 50% ostial LAD stenosis 50% mid stenosis. 30% disease in the left circumflex.   Diabetes mellitus, type 2 (HCC)    Hyperlipidemia    Hypertension    Onychomycosis of toenail 07/31/2016   PAD (peripheral artery disease) (HCC)    Sleep apnea       Had surgery to correct   Thyroid nodule     SURGICAL HISTORY: Past Surgical History:  Procedure Laterality Date   ABDOMINAL AORTOGRAM W/LOWER EXTREMITY N/A 08/07/2021   Procedure: ABDOMINAL AORTOGRAM W/LOWER EXTREMITY;  Surgeon: Maeola Harman, MD;  Location: [facility] INVASIVE CV LAB;  Service: Cardiovascular;  Laterality: N/A;   ABDOMINAL AORTOGRAM W/LOWER EXTREMITY Right 10/23/2021   Procedure: ABDOMINAL AORTOGRAM W/LOWER EXTREMITY;  Surgeon: Maeola Harman, MD;  Location: [facility] INVASIVE CV LAB;  Service: Cardiovascular;  Laterality: Right;   ABDOMINAL HYSTERECTOMY     APPLICATION OF INTRAOPERATIVE CT SCAN N/A 11/30/2020   Procedure: APPLICATION OF INTRAOPERATIVE CT SCAN;  Surgeon: Tia Alert, MD;  Location: [facility] OR;  Service: Neurosurgery;  Laterality: N/A;   BACK SURGERY     CARDIAC CATHETERIZATION     CATARACT EXTRACTION, BILATERAL Bilateral 2021   Dr. Joseph Art   ENDARTERECTOMY  09/27/2011   Procedure: RIGT ENDARTERECTOMY CAROTID;  Surgeon: Pryor Ochoa, MD;  Location: [facility] OR;  Service: Vascular;  Laterality: Right;   EPIDURAL BLOCK INJECTION  02/2008   Drs. Dorthula Nettles   EYE SURGERY Bilateral 2021   cataract   gyn surgery  2004   total hysterectomy for mennorhagia,,salpingoophorectomy   INCISION AND DRAINAGE ABSCESS Left 05/08/2022   Procedure: ARTHROSCOPIC INCISION AND DRAINAGE ABSCESS, DISTAL CLAVICLE EXCISSION, SUBACROMIAL  DECOMPRESSION, LOOSE BODY REMOVAL, EXTENSIVE DEBRIDEMENT;  Surgeon: Sheral Apley, MD;  Location: [facility] ORS;  Service: Orthopedics;  Laterality: Left;   LAMINECTOMY WITH POSTERIOR LATERAL ARTHRODESIS LEVEL 2 N/A 11/30/2020   Procedure: LUMBAR FOUR-FIVE, LUMBAR FIVE-SACRAL ONE POSTERIOR LATERAL FUSION WITH REVISION OF LUMBAR ONE-FIVE HARDWARE AND EXTENSION TO SACRAL ONE AND SACRAL TWO;  Surgeon: Tia Alert, MD;  Location: [facility] OR;  Service: Neurosurgery;  Laterality: N/A;   LUMBAR WOUND DEBRIDEMENT N/A 05/25/2020   Procedure: LUMBAR WOUND IRRIGATION AND DEBRIDEMENT;  Surgeon: Tia Alert, MD;  Location: [facility] OR;  Service: Neurosurgery;  Laterality: N/A;   PERIPHERAL VASCULAR INTERVENTION  08/07/2021   Procedure: PERIPHERAL VASCULAR INTERVENTION;  Surgeon: Maeola Harman, MD;  Location: [facility] INVASIVE CV LAB;  Service: Cardiovascular;;  Rt SFA   PERIPHERAL VASCULAR INTERVENTION Right 10/23/2021   Procedure: PERIPHERAL VASCULAR INTERVENTION;  Surgeon: Maeola Harman, MD;  Location: [facility] INVASIVE CV LAB;  Service: Cardiovascular;  Laterality: Right;  SFA   POSTERIOR LUMBAR FUSION 4 LEVEL N/A 04/25/2020   Procedure: POSTERIOR LUMBAR INTERBODY FUSION LUMBAR ONE-TWO, LUMBAR TWO-THREE, LUMBAR THREE-FOUR,LUMBAR FOUR-FIVE.;  Surgeon: Tia Alert, MD;  Location: [facility] OR;  Service: Neurosurgery;  Laterality: N/A;  posterior   SHOULDER ARTHROSCOPY Left 05/08/2022   Procedure: ARTHROSCOPY SHOULDER;  Surgeon: Sheral Apley, MD;  Location: [facility] ORS;  Service: Orthopedics;  Laterality: Left;   TONSILLECTOMY     TOTAL ABDOMINAL HYSTERECTOMY W/ BILATERAL SALPINGOOPHORECTOMY Bilateral 2000   TOTAL HIP ARTHROPLASTY Right 08/22/2018   Procedure: TOTAL HIP ARTHROPLASTY;  Surgeon: Frederico Hamman, MD;  Location: [facility] ORS;  Service: Orthopedics;  Laterality: Right;   TRANSCAROTID ARTERY REVASCULARIZATION  Left 08/07/2022   Procedure: Transcarotid Artery Revascularization;  Surgeon: Maeola Harman, MD;  Location: [facility] OR;  Service: Vascular;  Laterality: Left;   ULTRASOUND GUIDANCE FOR VASCULAR ACCESS Left 08/07/2022   Procedure: ULTRASOUND GUIDANCE FOR VASCULAR ACCESS;  Surgeon: Maeola Harman, MD;  Location: [facility] OR;  Service: Vascular;  Laterality: Left;   VARICOSE VEIN SURGERY  2008   stripping    I have reviewed the social history and family history with the patient and they are unchanged from previous note.  ALLERGIES:  is allergic to zestoretic [lisinopril-hydrochlorothiazide], codeine, oxycodone, ultram [tramadol], and zocor [simvastatin].  MEDICATIONS:  Current Outpatient Medications  Medication Sig Dispense Refill   acetaminophen (TYLENOL) 500 MG tablet Take 500 mg by mouth every 6 (six) hours as needed for moderate pain.     allopurinol (ZYLOPRIM) 300 MG tablet Take 1 tablet (300 mg total) by mouth daily. 90 tablet 3   aspirin EC 81 MG tablet Take 81 mg by mouth in the morning.     atorvastatin (LIPITOR) 80 MG tablet Take 80 mg by mouth daily.     clopidogrel (PLAVIX) 75 MG tablet Take 75 mg by mouth in the morning.     cyanocobalamin (VITAMIN B12) 1000 MCG tablet Take 1,000 mcg by mouth daily.     ezetimibe (ZETIA) 10 MG tablet TAKE 1 TABLET EVERY DAY (NEED MD  APPOINTMENT) (Patient taking differently: Take 10 mg by mouth daily.) 90 tablet 0   ferrous sulfate 325 (65 FE) MG tablet Take 325 mg by mouth daily.     gabapentin (NEURONTIN) 300 MG capsule Take 300 mg by mouth 3 (three) times daily.     glucose blood test strip Check sugars twice daily 100 each 11   metFORMIN (GLUCOPHAGE) 500 MG tablet Take 500 mg by mouth in the morning and at bedtime.     oxyCODONE-acetaminophen (PERCOCET) 10-325 MG tablet Take 1 tablet by mouth every 4 (four) hours as needed for pain. (Patient taking differently: Take 1 tablet by mouth every 6 (six) hours as needed for pain.) 40 tablet 0   No current facility-administered medications for this visit.    PHYSICAL  EXAMINATION: ECOG PERFORMANCE STATUS: 0 - Asymptomatic  Vitals:   10/11/22 1059  BP: (!) 142/61  Pulse: 82  Resp: 16  Temp: 98.2 F (36.8 C)  SpO2: 100%   Wt Readings from Last 3 Encounters:  10/11/22 136 lb 4.8 oz (61.8 kg)  09/05/22 134 lb (60.8 kg)  08/07/22 133 lb (60.3 kg)     GENERAL:alert, no distress and comfortable SKIN: skin color normal, no rashes or significant lesions EYES: normal, Conjunctiva are pink and non-injected, sclera clear  NEURO: alert & oriented x 3 with fluent speech  LABORATORY DATA:  I have reviewed the data as listed    Latest Ref Rng & Units 10/11/2022   10:43 AM 08/09/2022   12:52 AM 08/08/2022    5:58 AM  CBC  WBC 4.0 - 10.5 K/uL 10.4  10.3  11.0   Hemoglobin 12.0 - 15.0 g/dL 60.6  8.1  8.7   Hematocrit 36.0 - 46.0 % 35.7  25.8  28.0   Platelets 150 - 400 K/uL 308  230  253         Latest Ref Rng & Units 08/09/2022   12:52 AM 08/08/2022    5:58 AM 08/01/2022    9:30 AM  CMP  Glucose 70 - 99 mg/dL 301  601  093   BUN 8 - 23 mg/dL 16  17  11    Creatinine 0.44 - 1.00 mg/dL 2.35  5.73  2.20   Sodium 135 - 145 mmol/L 137  134  135   Potassium 3.5 - 5.1 mmol/L 3.6  3.7  3.9   Chloride 98 - 111 mmol/L 111  107  100   CO2 22 - 32 mmol/L 21  19  24    Calcium 8.9 - 10.3 mg/dL 7.9  8.0  8.7   Total Protein 6.5 - 8.1 g/dL   9.2   Total Bilirubin 0.3 - 1.2 mg/dL   0.4   Alkaline Phos 38 - 126 U/L   122   AST 15 - 41 U/L   15   ALT 0 - 44 U/L   11       RADIOGRAPHIC STUDIES: I have personally reviewed the radiological images as listed and agreed with the findings in the report. No results found.    No orders of the defined types were placed in this encounter.  All questions were answered. The patient knows to call the clinic with any problems, questions or concerns. No barriers to learning was detected. The total time spent in the appointment was 20 minutes.     Malachy Mood, MD 10/11/2022   Carolin Coy, CMA, am acting as scribe  for Malachy Mood, MD.   I have reviewed the  above documentation for accuracy and completeness, and I agree with the above.

## 2022-11-07 ENCOUNTER — Inpatient Hospital Stay: Payer: Medicare HMO | Attending: Nurse Practitioner

## 2022-11-07 DIAGNOSIS — D508 Other iron deficiency anemias: Secondary | ICD-10-CM | POA: Diagnosis present

## 2022-11-07 DIAGNOSIS — E538 Deficiency of other specified B group vitamins: Secondary | ICD-10-CM | POA: Insufficient documentation

## 2022-11-07 MED ORDER — CYANOCOBALAMIN 1000 MCG/ML IJ SOLN
1000.0000 ug | Freq: Once | INTRAMUSCULAR | Status: AC
  Administered 2022-11-07: 1000 ug via INTRAMUSCULAR
  Filled 2022-11-07: qty 1

## 2022-12-05 ENCOUNTER — Inpatient Hospital Stay: Payer: Medicare HMO | Attending: Nurse Practitioner

## 2022-12-05 VITALS — BP 147/64 | HR 84 | Temp 98.0°F | Resp 17

## 2022-12-05 DIAGNOSIS — E538 Deficiency of other specified B group vitamins: Secondary | ICD-10-CM | POA: Insufficient documentation

## 2022-12-05 DIAGNOSIS — D508 Other iron deficiency anemias: Secondary | ICD-10-CM | POA: Insufficient documentation

## 2022-12-05 MED ORDER — CYANOCOBALAMIN 1000 MCG/ML IJ SOLN
1000.0000 ug | Freq: Once | INTRAMUSCULAR | Status: AC
  Administered 2022-12-05: 1000 ug via INTRAMUSCULAR
  Filled 2022-12-05: qty 1

## 2022-12-25 ENCOUNTER — Encounter: Payer: Self-pay | Admitting: Podiatry

## 2022-12-25 ENCOUNTER — Ambulatory Visit (INDEPENDENT_AMBULATORY_CARE_PROVIDER_SITE_OTHER): Payer: Medicare HMO | Admitting: Podiatry

## 2022-12-25 VITALS — Ht 63.0 in | Wt 136.3 lb

## 2022-12-25 DIAGNOSIS — E1142 Type 2 diabetes mellitus with diabetic polyneuropathy: Secondary | ICD-10-CM | POA: Diagnosis not present

## 2022-12-25 DIAGNOSIS — B351 Tinea unguium: Secondary | ICD-10-CM

## 2022-12-25 DIAGNOSIS — M79675 Pain in left toe(s): Secondary | ICD-10-CM | POA: Diagnosis not present

## 2022-12-25 DIAGNOSIS — I739 Peripheral vascular disease, unspecified: Secondary | ICD-10-CM | POA: Diagnosis not present

## 2022-12-25 DIAGNOSIS — L84 Corns and callosities: Secondary | ICD-10-CM

## 2022-12-25 DIAGNOSIS — M79674 Pain in right toe(s): Secondary | ICD-10-CM

## 2022-12-30 NOTE — Progress Notes (Signed)
  Subjective:  Patient ID: April Shaffer, female    DOB: 1950-10-26,  MRN: [id number]  72 y.o. female presents with at risk footcare. Patient has h/o diabetes, neuropathy and PAD and is seen for  and callus(es) left foot and painful thick toenails that are difficult to trim. Painful toenails interfere with ambulation. Aggravating factors include wearing enclosed shoe gear. Pain is relieved with periodic professional debridement. Painful calluses are aggravated when weightbearing with and without shoegear. Pain is relieved with periodic professional debridement. Chief Complaint  Patient presents with   Nail Problem    DFC,Pt is a diabetic last A1C was 6, Last office visit was 6 months ago PCP is Dr Katrinka Blazing     PCP: Raymon Mutton., FNP.  New problem(s): None.   Review of Systems: Negative except as noted in the HPI.   Allergies  Allergen Reactions   Zestoretic [Lisinopril-Hydrochlorothiazide] Swelling    Angioedema  - Tongue swelling    Codeine Itching   Oxycodone Itching    Patient is taking at this time   Ultram [Tramadol] Itching   Zocor [Simvastatin] Other (See Comments)    Muscle pain    Objective:  There were no vitals filed for this visit. Constitutional Patient is a pleasant 72 y.o. African American female WD, WN in NAD. AAO x 3.  Vascular Capillary fill time to digits <3 seconds.  DP/PT pulse(s)  diminished RLE. DP/PT are faintly palpable LLE. Pedal hair absent b/l. Lower extremity skin temperature gradient warm to cool b/l. No pain with calf compression b/l. No cyanosis or clubbing noted. No ischemia nor gangrene noted b/l.   Neurologic Protective sensation decreased with 10 gram monofilament b/l. Vibratory sensation decreased b/l.  Dermatologic Pedal skin is thin, shiny and atrophic b/l.  No open wounds b/l lower extremities. No interdigital macerations b/l lower extremities.   Subungual longitudinal melanonychia noted left hallux nail and left 2nd digit. No extension  into proximal nailfold.  Toenails 1-5 b/l elongated, discolored, dystrophic, thickened, crumbly with subungual debris and tenderness to dorsal palpation.   Hyperkeratotic lesion(s) L hallux.  No erythema, no edema, no drainage, no fluctuance.  Orthopedic: Normal muscle strength 5/5 to all lower extremity muscle groups bilaterally. No pain, crepitus or joint limitation noted with ROM bilateral LE. No gross bony deformities bilaterally.   Last HgA1c:     Latest Ref Rng & Units 08/01/2022    9:30 AM 04/12/2022    9:39 AM  Hemoglobin A1C  Hemoglobin-A1c 4.8 - 5.6 % 6.0  6.4      Assessment:   1. Pain due to onychomycosis of toenails of both feet   2. Callus   3. PAD (peripheral artery disease) (HCC)   4. DM type 2 with diabetic peripheral neuropathy (HCC)    Plan:  Patient was evaluated and treated and all questions answered. Consent given for treatment as described below: -Continue supportive shoe gear daily. -Mycotic toenails 1-5 bilaterally were debrided in length and girth with sterile nail nippers and dremel without incident. -Callus(es) L hallux pared utilizing sterile scalpel blade without complication or incident. Total number debrided =1. -Patient/POA to call should there be question/concern in the interim.  Return in about 3 months (around 03/27/2023).  Freddie Breech, DPM

## 2023-01-02 ENCOUNTER — Inpatient Hospital Stay: Payer: Medicare HMO | Attending: Nurse Practitioner

## 2023-01-02 ENCOUNTER — Inpatient Hospital Stay: Payer: Medicare HMO

## 2023-01-02 VITALS — BP 152/68 | HR 69 | Temp 98.7°F | Resp 16

## 2023-01-02 DIAGNOSIS — E538 Deficiency of other specified B group vitamins: Secondary | ICD-10-CM

## 2023-01-02 DIAGNOSIS — D638 Anemia in other chronic diseases classified elsewhere: Secondary | ICD-10-CM

## 2023-01-02 DIAGNOSIS — D508 Other iron deficiency anemias: Secondary | ICD-10-CM | POA: Diagnosis present

## 2023-01-02 DIAGNOSIS — D500 Iron deficiency anemia secondary to blood loss (chronic): Secondary | ICD-10-CM

## 2023-01-02 LAB — CBC WITH DIFFERENTIAL (CANCER CENTER ONLY)
Abs Immature Granulocytes: 0.07 10*3/uL (ref 0.00–0.07)
Basophils Absolute: 0 10*3/uL (ref 0.0–0.1)
Basophils Relative: 0 %
Eosinophils Absolute: 0 10*3/uL (ref 0.0–0.5)
Eosinophils Relative: 0 %
HCT: 32.5 % — ABNORMAL LOW (ref 36.0–46.0)
Hemoglobin: 10.4 g/dL — ABNORMAL LOW (ref 12.0–15.0)
Immature Granulocytes: 1 %
Lymphocytes Relative: 21 %
Lymphs Abs: 2.4 10*3/uL (ref 0.7–4.0)
MCH: 28.6 pg (ref 26.0–34.0)
MCHC: 32 g/dL (ref 30.0–36.0)
MCV: 89.3 fL (ref 80.0–100.0)
Monocytes Absolute: 0.8 10*3/uL (ref 0.1–1.0)
Monocytes Relative: 7 %
Neutro Abs: 8.2 10*3/uL — ABNORMAL HIGH (ref 1.7–7.7)
Neutrophils Relative %: 71 %
Platelet Count: 333 10*3/uL (ref 150–400)
RBC: 3.64 MIL/uL — ABNORMAL LOW (ref 3.87–5.11)
RDW: 16.8 % — ABNORMAL HIGH (ref 11.5–15.5)
WBC Count: 11.5 10*3/uL — ABNORMAL HIGH (ref 4.0–10.5)
nRBC: 0 % (ref 0.0–0.2)

## 2023-01-02 LAB — IRON AND IRON BINDING CAPACITY (CC-WL,HP ONLY)
Iron: 58 ug/dL (ref 28–170)
Saturation Ratios: 20 % (ref 10.4–31.8)
TIBC: 290 ug/dL (ref 250–450)
UIBC: 232 ug/dL (ref 148–442)

## 2023-01-02 LAB — FERRITIN: Ferritin: 166 ng/mL (ref 11–307)

## 2023-01-02 LAB — VITAMIN B12: Vitamin B-12: 411 pg/mL (ref 180–914)

## 2023-01-02 MED ORDER — CYANOCOBALAMIN 1000 MCG/ML IJ SOLN
1000.0000 ug | Freq: Once | INTRAMUSCULAR | Status: AC
  Administered 2023-01-02: 1000 ug via INTRAMUSCULAR
  Filled 2023-01-02: qty 1

## 2023-01-08 ENCOUNTER — Ambulatory Visit (HOSPITAL_COMMUNITY)
Admission: RE | Admit: 2023-01-08 | Discharge: 2023-01-08 | Disposition: A | Discharge status: Home / Self Care | Payer: Medicare HMO | Source: Ambulatory Visit | Attending: Vascular Surgery | Admitting: Vascular Surgery

## 2023-01-08 ENCOUNTER — Ambulatory Visit (INDEPENDENT_AMBULATORY_CARE_PROVIDER_SITE_OTHER)
Admission: RE | Admit: 2023-01-08 | Discharge: 2023-01-08 | Disposition: A | Discharge status: Home / Self Care | Payer: Medicare HMO | Source: Ambulatory Visit | Attending: Vascular Surgery | Admitting: Vascular Surgery

## 2023-01-08 ENCOUNTER — Ambulatory Visit: Payer: Medicare HMO

## 2023-01-08 DIAGNOSIS — I739 Peripheral vascular disease, unspecified: Secondary | ICD-10-CM | POA: Insufficient documentation

## 2023-01-08 LAB — VAS US ABI WITH/WO TBI
Left ABI: 1.07
Right ABI: 1.08

## 2023-01-15 ENCOUNTER — Ambulatory Visit (INDEPENDENT_AMBULATORY_CARE_PROVIDER_SITE_OTHER): Payer: Medicare HMO | Admitting: Physician Assistant

## 2023-01-15 ENCOUNTER — Encounter: Payer: Self-pay | Admitting: Physician Assistant

## 2023-01-15 VITALS — BP 126/69 | HR 71 | Temp 97.9°F | Resp 18 | Ht 63.0 in | Wt 128.9 lb

## 2023-01-15 DIAGNOSIS — I739 Peripheral vascular disease, unspecified: Secondary | ICD-10-CM | POA: Diagnosis not present

## 2023-01-15 DIAGNOSIS — F17200 Nicotine dependence, unspecified, uncomplicated: Secondary | ICD-10-CM

## 2023-01-15 NOTE — Progress Notes (Addendum)
HISTORY AND PHYSICAL     CC:  follow up. Requesting Provider:  Raymon Mutton., FNP  HPI: This is a 72 y.o. female who is here today for follow up for PAD.  Pt has hx of right SFA stenting for rest pain. This was performed in June of 2023 by Dr. Randie Heinz with subsequent in stent restenosis requiring repeat intervention in August of 2023.   She also has hx of right CEA 09/27/2011 by Dr. Hart Rochester that has subsequently occluded.  She most recently underwent left TCAR 08/07/2022 by Dr. Randie Heinz for asymptomatic carotid artery stenosis.   Pt was last seen 09/05/2022 for her post op TCAR visit and at that time, she was doing well.    The pt returns today for follow up.  She denies any claudication, rest pain or non healing wounds.  She denies any stroke symptoms as well.  She gets occasional cramps in her legs at night.  She does get relief with mustard.  She is still smoking but has cut back significantly and only smokes 3-5 cigarettes per day.    The pt is on a statin for cholesterol management.    The pt is on an aspirin.    Other AC:  Plavix The pt is not on medication for hypertension.  The pt is  on medication for diabetes. Tobacco hx:  current  Pt does not have family hx of AAA.  She is a Investment banker, operational.     Past Medical History:  Diagnosis Date   Anemia    Arthritis    Blood transfusion without reported diagnosis    Carotid artery narrowing    Coronary artery disease    Cardiac catheterization November 2013: 50% ostial LAD stenosis 50% mid stenosis. 30% disease in the left circumflex.   Diabetes mellitus, type 2 (HCC)    Hyperlipidemia    Hypertension    Onychomycosis of toenail 07/31/2016   PAD (peripheral artery disease) (HCC)    Sleep apnea       Had surgery to correct   Thyroid nodule     Past Surgical History:  Procedure Laterality Date   ABDOMINAL AORTOGRAM W/LOWER EXTREMITY N/A 08/07/2021   Procedure: ABDOMINAL AORTOGRAM W/LOWER EXTREMITY;  Surgeon: Maeola Harman, MD;  Location: [facility] INVASIVE CV LAB;  Service: Cardiovascular;  Laterality: N/A;   ABDOMINAL AORTOGRAM W/LOWER EXTREMITY Right 10/23/2021   Procedure: ABDOMINAL AORTOGRAM W/LOWER EXTREMITY;  Surgeon: Maeola Harman, MD;  Location: [facility] INVASIVE CV LAB;  Service: Cardiovascular;  Laterality: Right;   ABDOMINAL HYSTERECTOMY     APPLICATION OF INTRAOPERATIVE CT SCAN N/A 11/30/2020   Procedure: APPLICATION OF INTRAOPERATIVE CT SCAN;  Surgeon: Tia Alert, MD;  Location: [facility] OR;  Service: Neurosurgery;  Laterality: N/A;   BACK SURGERY     CARDIAC CATHETERIZATION     CATARACT EXTRACTION, BILATERAL Bilateral 2021   Dr. Joseph Art   ENDARTERECTOMY  09/27/2011   Procedure: RIGT ENDARTERECTOMY CAROTID;  Surgeon: Pryor Ochoa, MD;  Location: [facility] OR;  Service: Vascular;  Laterality: Right;   EPIDURAL BLOCK INJECTION  02/2008   Drs. Dorthula Nettles   EYE SURGERY Bilateral 2021   cataract   gyn surgery  2004   total hysterectomy for mennorhagia,,salpingoophorectomy   INCISION AND DRAINAGE ABSCESS Left 05/08/2022   Procedure: ARTHROSCOPIC INCISION AND DRAINAGE ABSCESS, DISTAL CLAVICLE EXCISSION, SUBACROMIAL DECOMPRESSION, LOOSE BODY REMOVAL, EXTENSIVE DEBRIDEMENT;  Surgeon: Sheral Apley, MD;  Location: [facility] ORS;  Service: Orthopedics;  Laterality: Left;  LAMINECTOMY WITH POSTERIOR LATERAL ARTHRODESIS LEVEL 2 N/A 11/30/2020   Procedure: LUMBAR FOUR-FIVE, LUMBAR FIVE-SACRAL ONE POSTERIOR LATERAL FUSION WITH REVISION OF LUMBAR ONE-FIVE HARDWARE AND EXTENSION TO SACRAL ONE AND SACRAL TWO;  Surgeon: Tia Alert, MD;  Location: [facility] OR;  Service: Neurosurgery;  Laterality: N/A;   LUMBAR WOUND DEBRIDEMENT N/A 05/25/2020   Procedure: LUMBAR WOUND IRRIGATION AND DEBRIDEMENT;  Surgeon: Tia Alert, MD;  Location: [facility] OR;  Service: Neurosurgery;  Laterality: N/A;   PERIPHERAL VASCULAR INTERVENTION  08/07/2021   Procedure: PERIPHERAL VASCULAR INTERVENTION;  Surgeon: Maeola Harman, MD;  Location: [facility] INVASIVE CV LAB;  Service: Cardiovascular;;  Rt SFA   PERIPHERAL VASCULAR INTERVENTION Right 10/23/2021   Procedure: PERIPHERAL VASCULAR INTERVENTION;  Surgeon: Maeola Harman, MD;  Location: [facility] INVASIVE CV LAB;  Service: Cardiovascular;  Laterality: Right;  SFA   POSTERIOR LUMBAR FUSION 4 LEVEL N/A 04/25/2020   Procedure: POSTERIOR LUMBAR INTERBODY FUSION LUMBAR ONE-TWO, LUMBAR TWO-THREE, LUMBAR THREE-FOUR,LUMBAR FOUR-FIVE.;  Surgeon: Tia Alert, MD;  Location: [facility] OR;  Service: Neurosurgery;  Laterality: N/A;  posterior   SHOULDER ARTHROSCOPY Left 05/08/2022   Procedure: ARTHROSCOPY SHOULDER;  Surgeon: Sheral Apley, MD;  Location: [facility] ORS;  Service: Orthopedics;  Laterality: Left;   TONSILLECTOMY     TOTAL ABDOMINAL HYSTERECTOMY W/ BILATERAL SALPINGOOPHORECTOMY Bilateral 2000   TOTAL HIP ARTHROPLASTY Right 08/22/2018   Procedure: TOTAL HIP ARTHROPLASTY;  Surgeon: Frederico Hamman, MD;  Location: [facility] ORS;  Service: Orthopedics;  Laterality: Right;   TRANSCAROTID ARTERY REVASCULARIZATION  Left 08/07/2022   Procedure: Transcarotid Artery Revascularization;  Surgeon: Maeola Harman, MD;  Location: [facility] OR;  Service: Vascular;  Laterality: Left;   ULTRASOUND GUIDANCE FOR VASCULAR ACCESS Left 08/07/2022   Procedure: ULTRASOUND GUIDANCE FOR VASCULAR ACCESS;  Surgeon: Maeola Harman, MD;  Location: [facility] OR;  Service: Vascular;  Laterality: Left;   VARICOSE VEIN SURGERY  2008   stripping    Allergies  Allergen Reactions   Zestoretic [Lisinopril-Hydrochlorothiazide] Swelling    Angioedema  - Tongue swelling    Codeine Itching   Oxycodone Itching    Patient is taking at this time   Ultram [Tramadol] Itching   Zocor [Simvastatin] Other (See Comments)    Muscle pain    Current Outpatient Medications  Medication Sig Dispense Refill   acetaminophen (TYLENOL) 500 MG tablet Take 500 mg by mouth every 6 (six) hours as needed for  moderate pain.     allopurinol (ZYLOPRIM) 300 MG tablet Take 1 tablet (300 mg total) by mouth daily. 90 tablet 3   aspirin EC 81 MG tablet Take 81 mg by mouth in the morning.     atorvastatin (LIPITOR) 80 MG tablet Take 80 mg by mouth daily.     clopidogrel (PLAVIX) 75 MG tablet Take 75 mg by mouth in the morning.     cyanocobalamin (VITAMIN B12) 1000 MCG tablet Take 1,000 mcg by mouth daily.     ezetimibe (ZETIA) 10 MG tablet TAKE 1 TABLET EVERY DAY (NEED MD APPOINTMENT) (Patient taking differently: Take 10 mg by mouth daily.) 90 tablet 0   ferrous sulfate 325 (65 FE) MG tablet Take 325 mg by mouth daily.     gabapentin (NEURONTIN) 300 MG capsule Take 300 mg by mouth 3 (three) times daily.     glucose blood test strip Check sugars twice daily 100 each 11   metFORMIN (GLUCOPHAGE) 500 MG tablet Take 500 mg by mouth in the morning and at bedtime.  oxyCODONE-acetaminophen (PERCOCET) 10-325 MG tablet Take 1 tablet by mouth every 4 (four) hours as needed for pain. (Patient taking differently: Take 1 tablet by mouth every 6 (six) hours as needed for pain.) 40 tablet 0   No current facility-administered medications for this visit.    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    Comment: [employer]-retired 07/23/2014  Tobacco Use   Smoking status: Every Day    Current packs/day: 0.25    Average packs/day: 0.3 packs/day for 54.6 years (13.7 ttl pk-yrs)    Types:  Cigarettes    Start date: 05/31/1968    Passive exposure: Never   Smokeless tobacco: Never   Tobacco comments:    Smoked x50 years, 1 pack would last 3 days. Currently smoking 4-5 cigarettes per day  Vaping Use   Vaping status: Never Used  Substance and Sexual Activity   Alcohol use: Yes    Alcohol/week: 1.0 standard drink of alcohol    Types: 1 Cans of beer per week    Comment: 1-2 drinks every other day   Drug use: No   Sexual activity: Not on file  Other Topics Concern   Not on file  Social History Narrative   Lives with long term boyfriend (30 years together)   Both sons live in [place]   Social Determinants of Health   Financial Resource Strain: Low Risk  (08/06/2018)   Overall Financial Resource Strain (CARDIA)    Difficulty of Paying Living Expenses: Not hard at all  Food Insecurity: No Food Insecurity (05/08/2022)   Hunger Vital Sign    Worried About Running Out of Food in the Last Year: Never true    Ran Out of Food in the Last Year: Never true  Transportation Needs: No Transportation Needs (05/08/2022)   PRAPARE - Administrator, Civil Service (Medical): No    Lack of Transportation (Non-Medical): No  Physical Activity: Inactive (06/17/2017)   Exercise Vital Sign    Days of Exercise per Week: 0 days    Minutes of Exercise per Session: 0 min  Stress: No Stress Concern Present (06/17/2017)   [employer] of Occupational Health - Occupational Stress Questionnaire    Feeling of Stress : Not at all  Social Connections: Unknown (08/06/2018)   Social Connection and Isolation Panel [NHANES]    Frequency of Communication with Friends and Family: Not on file    Frequency of Social Gatherings with Friends and Family: Not on file    Attends Religious Services: Not on file    Active Member of Clubs or Organizations: Not on file    Attends Banker Meetings: Not on file    Marital Status: Living with partner  Intimate Partner Violence: Not At Risk  (05/08/2022)   Humiliation, Afraid, Rape, and Kick questionnaire  Fear of Current or Ex-Partner: No    Emotionally Abused: No    Physically Abused: No    Sexually Abused: No     REVIEW OF SYSTEMS:   [X]  denotes positive finding, [ ]  denotes negative finding Cardiac  Comments:  Chest pain or chest pressure:    Shortness of breath upon exertion:    Short of breath when lying flat:    Irregular heart rhythm:        Vascular    Pain in calf, thigh, or hip brought on by ambulation:    Pain in feet at night that wakes you up from your sleep:     Blood clot in your veins:    Leg swelling:         Pulmonary    Oxygen at home:    Productive cough:     Wheezing:         Neurologic    Sudden weakness in arms or legs:     Sudden numbness in arms or legs:     Sudden onset of difficulty speaking or slurred speech:    Temporary loss of vision in one eye:     Problems with dizziness:         Gastrointestinal    Blood in stool:     Vomited blood:         Genitourinary    Burning when urinating:     Blood in urine:        Psychiatric    Major depression:         Hematologic    Bleeding problems:    Problems with blood clotting too easily:        Skin    Rashes or ulcers:        Constitutional    Fever or chills:      PHYSICAL EXAMINATION:  Today's Vitals   01/15/23 1106  BP: 126/69  Pulse: 71  Resp: 18  Temp: 97.9 F (36.6 C)  TempSrc: Temporal  SpO2: 97%  Weight: 128 lb 14.4 oz (58.5 kg)  Height: 5\' 3"  (1.6 m)  PainSc: 0-No pain   Body mass index is 22.83 kg/m.   General:  WDWN in NAD; vital signs documented above Gait: Not observed HENT: WNL, normocephalic Pulmonary: normal non-labored breathing , without wheezing Cardiac: regular HR, with carotid bruit on the left Abdomen: soft, NT; aortic pulse is not palpable Skin: without rashes Vascular Exam/Pulses:  Right Left  Radial 2+ (normal) 2+ (normal)  Femoral 2+ (normal) 2+ (normal)  DP Brisk  monophasic Brisk biphasic  PT Brisk monophasic Brisk biphasic  Peroneal Brisk biphasic Brisk monophasic   Extremities: without ischemic changes, without Gangrene , without cellulitis; without open wounds Musculoskeletal: no muscle wasting or atrophy  Neurologic: A&O X 3 Psychiatric:  The pt has Normal affect.   Non-Invasive Vascular Imaging:   ABI's/TBI's on 01/08/2023: Right:  1.08/0.76 - Great toe pressure: 109 Left:  1.07/0.55 - Great toe pressure: 79  Arterial duplex on 01/08/2023: +----------+--------+-----+---------------+--------+--------+  RIGHT    PSV cm/sRatioStenosis       WaveformComments  +----------+--------+-----+---------------+--------+--------+  CFA Distal131                         biphasic          +----------+--------+-----+---------------+--------+--------+  SFA Prox  130  biphasic          +----------+--------+-----+---------------+--------+--------+  SFA Mid   255          50-74% stenosisbiphasic          +----------+--------+-----+---------------+--------+--------+  POP Prox  167          30-49% stenosisbiphasic          +----------+--------+-----+---------------+--------+--------+  POP Distal115                         biphasic          +----------+--------+-----+---------------+--------+--------+     Right Stent(s):  +---------------+--------+--------+--------+--------+  proximal SFA   PSV cm/sStenosisWaveformComments  +---------------+--------+--------+--------+--------+  Prox to Stent  103             biphasic          +---------------+--------+--------+--------+--------+  Proximal Stent 105             biphasic          +---------------+--------+--------+--------+--------+  Mid Stent      82              biphasic          +---------------+--------+--------+--------+--------+  Distal Stent   82              biphasic           +---------------+--------+--------+--------+--------+  Distal to [id number]             biphasic          +---------------+--------+--------+--------+--------+   +---------------+--------+--------------+--------+--------+  distal SFA     PSV cm/sStenosis      WaveformComments  +---------------+--------+--------------+--------+--------+  Prox to Stent  45                    biphasic          +---------------+--------+--------------+--------+--------+  Proximal Stent 50                    biphasic          +---------------+--------+--------------+--------+--------+  Mid Stent      61                    biphasic          +---------------+--------+--------------+--------+--------+  Distal Stent   56                    biphasic          +---------------+--------+--------------+--------+--------+  Distal to Stent167     1-49% stenosisbiphasic          +---------------+--------+--------------+--------+--------+   Summary:  Right: Patent proximal and distal SFA stenting.  Mid SFA velocities suggest a 50-74% stenosis.  Proximal popliteal artery velocities suggest 30-49% stenosis.   Previous ABI's/TBI's on 07/04/2022: Right:  0.90/0.73 - Great toe pressure: 113 Left:  0.87/0.52 - Great toe pressure:  81  Previous arterial duplex on 07/04/2022: +-----------+--------+-----+--------+--------+--------+  RIGHT     PSV cm/sRatioStenosisWaveformComments  +-----------+--------+-----+--------+--------+--------+  CFA Distal 116                  biphasic          +-----------+--------+-----+--------+--------+--------+  DFA       114                  biphasic          +-----------+--------+-----+--------+--------+--------+  SFA Prox   105  biphasic          +-----------+--------+-----+--------+--------+--------+  POP Prox   82                   biphasic           +-----------+--------+-----+--------+--------+--------+  POP Distal 107                  biphasic          +-----------+--------+-----+--------+--------+--------+  TP Trunk   58                   biphasic          +-----------+--------+-----+--------+--------+--------+  ATA Distal 50                   biphasic          +-----------+--------+-----+--------+--------+--------+  PTA Distal 20                   biphasic          +-----------+--------+-----+--------+--------+--------+  PERO Distal32                   biphasic          +-----------+--------+-----+--------+--------+--------+     Right Stent(s):  +---------------+--------+--------+--------+--------+  proximal SFA   PSV cm/sStenosisWaveformComments  +---------------+--------+--------+--------+--------+  Prox to Stent  154             biphasic          +---------------+--------+--------+--------+--------+  Proximal Stent 147             biphasic          +---------------+--------+--------+--------+--------+  Mid Stent      68              biphasic          +---------------+--------+--------+--------+--------+  Distal Stent   75              biphasic          +---------------+--------+--------+--------+--------+  Distal to Stent79              biphasic          +---------------+--------+--------+--------+--------+   +---------------+--------+--------+--------+--------+  Distal SFA     PSV cm/sStenosisWaveformComments  +---------------+--------+--------+--------+--------+  Prox to Stent  140             biphasic          +---------------+--------+--------+--------+--------+  Proximal Stent 149             biphasic          +---------------+--------+--------+--------+--------+  Mid Stent      106             biphasic          +---------------+--------+--------+--------+--------+  Distal Stent   55              biphasic           +---------------+--------+--------+--------+--------+  Distal to Stent56              biphasic          +---------------+--------+--------+--------+--------+   Summary:  Right: Patent stents with no evidence of stenosis in the proximal and  distal superficial femoral artery artery.      ASSESSMENT/PLAN:: 72 y.o. female here for follow up for PAD with hx of right SFA stenting for rest pain. This was performed in June of 2023 by Dr. Randie Heinz with subsequent in stent restenosis requiring repeat  intervention in August of 2023.   She also has hx of right CEA 09/27/2011 by Dr. Hart Rochester that has subsequently occluded.  She most recently underwent left TCAR 08/07/2022 by Dr. Randie Heinz for asymptomatic carotid artery stenosis.   PAD -pt doing well without claudication, rest pain or non healing wounds.  She does have brisk doppler signals bilaterally.   -her duplex today reveals stenosis in the mid SFA and a milder stenosis in the proximal popliteal artery.  Given she is asymptomatic, she will return in short follow up to monitor this.   -she does go to podiatrist every 3 months for diabetic foot care -continue asa/statin/plavix   Carotid stenosis  -pt has occluded right ICA and s/p left TCAR 08/07/2022.  She remains asymptomatic -needs f/u carotid duplex March 2025.    Will schedule her RLE arterial duplex, ABI and carotid duplex in 3-4 months and see Dr. Randie Heinz at that time.    Current smoker -she has cut back significantly and smokes 3-5 cigarettes per day.  Discussed the importance of quitting completely.     Doreatha Massed, [facility] [phone]  Clinic MD:   Karin Lieu

## 2023-01-21 ENCOUNTER — Other Ambulatory Visit: Payer: Self-pay

## 2023-01-21 DIAGNOSIS — I6523 Occlusion and stenosis of bilateral carotid arteries: Secondary | ICD-10-CM

## 2023-01-21 DIAGNOSIS — I739 Peripheral vascular disease, unspecified: Secondary | ICD-10-CM

## 2023-01-30 ENCOUNTER — Inpatient Hospital Stay: Payer: Medicare HMO | Attending: Nurse Practitioner

## 2023-01-30 VITALS — BP 136/65 | HR 86 | Temp 98.7°F | Resp 17

## 2023-01-30 DIAGNOSIS — E538 Deficiency of other specified B group vitamins: Secondary | ICD-10-CM | POA: Insufficient documentation

## 2023-01-30 DIAGNOSIS — D508 Other iron deficiency anemias: Secondary | ICD-10-CM | POA: Insufficient documentation

## 2023-01-30 MED ORDER — CYANOCOBALAMIN 1000 MCG/ML IJ SOLN
1000.0000 ug | Freq: Once | INTRAMUSCULAR | Status: AC
  Administered 2023-01-30: 1000 ug via INTRAMUSCULAR
  Filled 2023-01-30: qty 1

## 2023-03-01 ENCOUNTER — Inpatient Hospital Stay: Payer: Medicare HMO | Attending: Nurse Practitioner

## 2023-03-01 VITALS — BP 139/71 | HR 83 | Temp 97.7°F | Resp 18

## 2023-03-01 DIAGNOSIS — E538 Deficiency of other specified B group vitamins: Secondary | ICD-10-CM | POA: Insufficient documentation

## 2023-03-01 DIAGNOSIS — D508 Other iron deficiency anemias: Secondary | ICD-10-CM | POA: Insufficient documentation

## 2023-03-01 MED ORDER — CYANOCOBALAMIN 1000 MCG/ML IJ SOLN
1000.0000 ug | Freq: Once | INTRAMUSCULAR | Status: AC
  Administered 2023-03-01: 1000 ug via INTRAMUSCULAR
  Filled 2023-03-01: qty 1

## 2023-03-28 ENCOUNTER — Inpatient Hospital Stay (HOSPITAL_BASED_OUTPATIENT_CLINIC_OR_DEPARTMENT_OTHER): Payer: Medicare HMO | Admitting: Hematology

## 2023-03-28 ENCOUNTER — Inpatient Hospital Stay: Payer: Medicare HMO

## 2023-03-28 VITALS — BP 155/72 | HR 87 | Temp 97.3°F | Resp 18 | Wt 128.3 lb

## 2023-03-28 DIAGNOSIS — D638 Anemia in other chronic diseases classified elsewhere: Secondary | ICD-10-CM

## 2023-03-28 DIAGNOSIS — D500 Iron deficiency anemia secondary to blood loss (chronic): Secondary | ICD-10-CM

## 2023-03-28 DIAGNOSIS — E538 Deficiency of other specified B group vitamins: Secondary | ICD-10-CM

## 2023-03-28 LAB — CBC WITH DIFFERENTIAL (CANCER CENTER ONLY)
Abs Immature Granulocytes: 0.03 10*3/uL (ref 0.00–0.07)
Basophils Absolute: 0.1 10*3/uL (ref 0.0–0.1)
Basophils Relative: 1 %
Eosinophils Absolute: 0.1 10*3/uL (ref 0.0–0.5)
Eosinophils Relative: 1 %
HCT: 36.2 % (ref 36.0–46.0)
Hemoglobin: 11.5 g/dL — ABNORMAL LOW (ref 12.0–15.0)
Immature Granulocytes: 0 %
Lymphocytes Relative: 20 %
Lymphs Abs: 1.8 10*3/uL (ref 0.7–4.0)
MCH: 28 pg (ref 26.0–34.0)
MCHC: 31.8 g/dL (ref 30.0–36.0)
MCV: 88.3 fL (ref 80.0–100.0)
Monocytes Absolute: 0.7 10*3/uL (ref 0.1–1.0)
Monocytes Relative: 8 %
Neutro Abs: 6.5 10*3/uL (ref 1.7–7.7)
Neutrophils Relative %: 70 %
Platelet Count: 304 10*3/uL (ref 150–400)
RBC: 4.1 MIL/uL (ref 3.87–5.11)
RDW: 17.1 % — ABNORMAL HIGH (ref 11.5–15.5)
WBC Count: 9.2 10*3/uL (ref 4.0–10.5)
nRBC: 0 % (ref 0.0–0.2)

## 2023-03-28 LAB — IRON AND IRON BINDING CAPACITY (CC-WL,HP ONLY)
Iron: 65 ug/dL (ref 28–170)
Saturation Ratios: 17 % (ref 10.4–31.8)
TIBC: 382 ug/dL (ref 250–450)
UIBC: 317 ug/dL (ref 148–442)

## 2023-03-28 LAB — VITAMIN B12: Vitamin B-12: 508 pg/mL (ref 180–914)

## 2023-03-28 LAB — FERRITIN: Ferritin: 102 ng/mL (ref 11–307)

## 2023-03-28 MED ORDER — CYANOCOBALAMIN 1000 MCG/ML IJ SOLN
1000.0000 ug | Freq: Once | INTRAMUSCULAR | Status: AC
  Administered 2023-03-28: 1000 ug via INTRAMUSCULAR
  Filled 2023-03-28: qty 1

## 2023-03-28 NOTE — Patient Instructions (Signed)
Vitamin B12 Injection What is this medication? Vitamin B12 (VAHY tuh min B12) prevents and treats low vitamin B12 levels in your body. It is used in people who do not get enough vitamin B12 from their diet or when their digestive tract does not absorb enough. Vitamin B12 plays an important role in maintaining the health of your nervous system and red blood cells. This medicine may be used for other purposes; ask your health care provider or pharmacist if you have questions. COMMON BRAND NAME(S): B-12 Compliance Kit, B-12 Injection Kit, Cyomin, Dodex, LA-12, Nutri-Twelve, Physicians EZ Use B-12, Primabalt, Vitamin Deficiency Injectable System - B12 What should I tell my care team before I take this medication? They need to know if you have any of these conditions: Kidney disease Leber's disease Megaloblastic anemia An unusual or allergic reaction to cyanocobalamin, cobalt, other medications, foods, dyes, or preservatives Pregnant or trying to get pregnant Breast-feeding How should I use this medication? This medication is injected into a muscle or deeply under the skin. It is usually given in a clinic or care team's office. However, your care team may teach you how to inject yourself. Follow all instructions. Talk to your care team about the use of this medication in children. Special care may be needed. Overdosage: If you think you have taken too much of this medicine contact a poison control center or emergency room at once. NOTE: This medicine is only for you. Do not share this medicine with others. What if I miss a dose? If you are given your dose at a clinic or care team's office, call to reschedule your appointment. If you give your own injections, and you miss a dose, take it as soon as you can. If it is almost time for your next dose, take only that dose. Do not take double or extra doses. What may interact with this medication? Alcohol Colchicine This list may not describe all possible  interactions. Give your health care provider a list of all the medicines, herbs, non-prescription drugs, or dietary supplements you use. Also tell them if you smoke, drink alcohol, or use illegal drugs. Some items may interact with your medicine. What should I watch for while using this medication? Visit your care team regularly. You may need blood work done while you are taking this medication. You may need to follow a special diet. Talk to your care team. Limit your alcohol intake and avoid smoking to get the best benefit. What side effects may I notice from receiving this medication? Side effects that you should report to your care team as soon as possible: Allergic reactions--skin rash, itching, hives, swelling of the face, lips, tongue, or throat Swelling of the ankles, hands, or feet Trouble breathing Side effects that usually do not require medical attention (report to your care team if they continue or are bothersome): Diarrhea This list may not describe all possible side effects. Call your doctor for medical advice about side effects. You may report side effects to FDA at 1-800-FDA-1088. Where should I keep my medication? Keep out of the reach of children. Store at room temperature between 15 and 30 degrees C (59 and 85 degrees F). Protect from light. Throw away any unused medication after the expiration date. NOTE: This sheet is a summary. It may not cover all possible information. If you have questions about this medicine, talk to your doctor, pharmacist, or health care provider.  2024 Elsevier/Gold Standard (2020-10-25 00:00:00)

## 2023-03-28 NOTE — Assessment & Plan Note (Signed)
-  previous lab showed low B12 and elevated MMA which confirms B12 deficiency.   -intrinsic factor from 08/31/21 was normal  -she began B12 injections on 08/31/21, weekly through 10/11/21 and now on monthly. Last dose in 12/2021 -She had a multiple hospital admission since January 2024, and missed her B12 injections, her anemia got worse, but improved again after resume B12 injections

## 2023-03-28 NOTE — Assessment & Plan Note (Signed)
-  iron panel 08/01/21 indicative of anemia of chronic disease rather than true iron deficiency  --iron on 12/06/21 was 19. She received 3 doses IV Venofer from 12/22/21.  She responded well, hemoglobin improved to 11.8 after IV iron.

## 2023-03-29 ENCOUNTER — Inpatient Hospital Stay: Payer: Medicare HMO

## 2023-03-29 ENCOUNTER — Inpatient Hospital Stay: Payer: Medicare HMO | Admitting: Nurse Practitioner

## 2023-03-29 ENCOUNTER — Encounter: Payer: Self-pay | Admitting: Nurse Practitioner

## 2023-03-29 NOTE — Progress Notes (Signed)
[facility]   [phone] [phone]) [phone]   Clinic Follow up Note   Patient Care Team: Raymon Mutton., FNP as PCP - General (Family Medicine) Runell Gess, MD as PCP - Cardiology (Cardiology) Frederico Hamman, MD as Consulting Physician (Orthopedic Surgery) Malachy Mood, MD as Consulting Physician (Hematology) Pollyann Samples, NP as Nurse Practitioner (Nurse Practitioner) Raymon Mutton., FNP as Nurse Practitioner (Family Medicine) Arman Bogus, MD as Consulting Physician (Neurosurgery)  Date of Service:  03/28/2023  CHIEF COMPLAINT: f/u of anemia  CURRENT THERAPY:  B12 injection monthly IV iron as needed  Oncology History   B12 deficiency -previous lab showed low B12 and elevated MMA which confirms B12 deficiency.   -intrinsic factor from 08/31/21 was normal  -she began B12 injections on 08/31/21, weekly through 10/11/21 and now on monthly. Last dose in 12/2021 -She had a multiple hospital admission since January 2024, and missed her B12 injections, her anemia got worse, but improved again after resume B12 injections   Anemia of chronic disease -iron panel 08/01/21 indicative of anemia of chronic disease rather than true iron deficiency  --iron on 12/06/21 was 19. She received 3 doses IV Venofer from 12/22/21.  She responded well, hemoglobin improved to 11.8 after IV iron.   Assessment and Plan    Anemia Reports improved energy levels. Hemoglobin increased to 11.5 g/dL from low 8s last summer. B12 and iron supplementation effective. B12 levels were normal in November; awaiting current results. Consents to continue current regimen. - Continue monthly B12 injections - Continue daily oral iron supplementation - Order lab tests in 3 and 6 months to monitor blood counts and B12 levels - Administer B12 injection today  Hypertension Started on hydrochlorothiazide for blood pressure control and foot swelling. Blood pressure elevated to 175 mmHg  but normalized after rest. Emphasized regular monitoring and medication adherence. - Continue current antihypertensive medication - Monitor blood pressure regularly  Chronic Pain Post-Back Surgery Uses oxycodone and gabapentin for pain management. Oxycodone taken as needed, approximately every six hours or less. Discussed importance of adherence to prescribed regimen. - Continue current pain management regimen with oxycodone and gabapentin as needed  Diabetes Mellitus Follow-up with primary care physician for diabetes management scheduled. - Follow up with primary care physician on February 2nd or 3rd for diabetes management  Plan -Lab reviewed, anemia improved, hemoglobin 11.5 today -Will proceed to B12 injection today and continue every months -Will consider IV iron if ferritin less than 50 -Today's iron and B12 level are adequate -Lab every 3 months and follow-up in 6 months     Discussed the use of AI scribe software for clinical note transcription with the patient, who gave verbal consent to proceed.  History of Present Illness   The patient, a 73 year old with a history of anemia, presents for a follow-up visit. She reports a satisfactory energy level and has been adhering to her prescribed treatment regimen, which includes monthly B12 injections and daily oral iron supplements. The patient notes an improvement in her overall health compared to the previous year. She also mentions a change in her blood pressure and diabetes medications due to foot swelling. The patient has a history of back surgery and takes oxycodone and gabapentin for pain management.         All other systems were reviewed with the patient and are negative.  MEDICAL HISTORY:  Past Medical History:  Diagnosis Date   Anemia    Arthritis  Blood transfusion without reported diagnosis    Carotid artery narrowing    Coronary artery disease    Cardiac catheterization November 2013: 50% ostial LAD stenosis  50% mid stenosis. 30% disease in the left circumflex.   Diabetes mellitus, type 2 (HCC)    Hyperlipidemia    Hypertension    Onychomycosis of toenail 07/31/2016   PAD (peripheral artery disease) (HCC)    Sleep apnea       Had surgery to correct   Thyroid nodule     SURGICAL HISTORY: Past Surgical History:  Procedure Laterality Date   ABDOMINAL AORTOGRAM W/LOWER EXTREMITY N/A 08/07/2021   Procedure: ABDOMINAL AORTOGRAM W/LOWER EXTREMITY;  Surgeon: Maeola Harman, MD;  Location: [facility] INVASIVE CV LAB;  Service: Cardiovascular;  Laterality: N/A;   ABDOMINAL AORTOGRAM W/LOWER EXTREMITY Right 10/23/2021   Procedure: ABDOMINAL AORTOGRAM W/LOWER EXTREMITY;  Surgeon: Maeola Harman, MD;  Location: [facility] INVASIVE CV LAB;  Service: Cardiovascular;  Laterality: Right;   ABDOMINAL HYSTERECTOMY     APPLICATION OF INTRAOPERATIVE CT SCAN N/A 11/30/2020   Procedure: APPLICATION OF INTRAOPERATIVE CT SCAN;  Surgeon: Tia Alert, MD;  Location: [facility] OR;  Service: Neurosurgery;  Laterality: N/A;   BACK SURGERY     CARDIAC CATHETERIZATION     CATARACT EXTRACTION, BILATERAL Bilateral 2021   Dr. Joseph Art   ENDARTERECTOMY  09/27/2011   Procedure: RIGT ENDARTERECTOMY CAROTID;  Surgeon: Pryor Ochoa, MD;  Location: [facility] OR;  Service: Vascular;  Laterality: Right;   EPIDURAL BLOCK INJECTION  02/2008   Drs. Dorthula Nettles   EYE SURGERY Bilateral 2021   cataract   gyn surgery  2004   total hysterectomy for mennorhagia,,salpingoophorectomy   INCISION AND DRAINAGE ABSCESS Left 05/08/2022   Procedure: ARTHROSCOPIC INCISION AND DRAINAGE ABSCESS, DISTAL CLAVICLE EXCISSION, SUBACROMIAL DECOMPRESSION, LOOSE BODY REMOVAL, EXTENSIVE DEBRIDEMENT;  Surgeon: Sheral Apley, MD;  Location: [facility] ORS;  Service: Orthopedics;  Laterality: Left;   LAMINECTOMY WITH POSTERIOR LATERAL ARTHRODESIS LEVEL 2 N/A 11/30/2020   Procedure: LUMBAR FOUR-FIVE, LUMBAR FIVE-SACRAL ONE POSTERIOR LATERAL FUSION WITH  REVISION OF LUMBAR ONE-FIVE HARDWARE AND EXTENSION TO SACRAL ONE AND SACRAL TWO;  Surgeon: Tia Alert, MD;  Location: [facility] OR;  Service: Neurosurgery;  Laterality: N/A;   LUMBAR WOUND DEBRIDEMENT N/A 05/25/2020   Procedure: LUMBAR WOUND IRRIGATION AND DEBRIDEMENT;  Surgeon: Tia Alert, MD;  Location: [facility] OR;  Service: Neurosurgery;  Laterality: N/A;   PERIPHERAL VASCULAR INTERVENTION  08/07/2021   Procedure: PERIPHERAL VASCULAR INTERVENTION;  Surgeon: Maeola Harman, MD;  Location: [facility] INVASIVE CV LAB;  Service: Cardiovascular;;  Rt SFA   PERIPHERAL VASCULAR INTERVENTION Right 10/23/2021   Procedure: PERIPHERAL VASCULAR INTERVENTION;  Surgeon: Maeola Harman, MD;  Location: [facility] INVASIVE CV LAB;  Service: Cardiovascular;  Laterality: Right;  SFA   POSTERIOR LUMBAR FUSION 4 LEVEL N/A 04/25/2020   Procedure: POSTERIOR LUMBAR INTERBODY FUSION LUMBAR ONE-TWO, LUMBAR TWO-THREE, LUMBAR THREE-FOUR,LUMBAR FOUR-FIVE.;  Surgeon: Tia Alert, MD;  Location: [facility] OR;  Service: Neurosurgery;  Laterality: N/A;  posterior   SHOULDER ARTHROSCOPY Left 05/08/2022   Procedure: ARTHROSCOPY SHOULDER;  Surgeon: Sheral Apley, MD;  Location: [facility] ORS;  Service: Orthopedics;  Laterality: Left;   TONSILLECTOMY     TOTAL ABDOMINAL HYSTERECTOMY W/ BILATERAL SALPINGOOPHORECTOMY Bilateral 2000   TOTAL HIP ARTHROPLASTY Right 08/22/2018   Procedure: TOTAL HIP ARTHROPLASTY;  Surgeon: Frederico Hamman, MD;  Location: [facility] OR[facility];  Service: Orthopedics;  Laterality: Right;   TRANSCAROTID ARTERY REVASCULARIZATION  Left 08/07/2022   Procedure: Transcarotid  Artery Revascularization;  Surgeon: Maeola Harman, MD;  Location: [facility] OR;  Service: Vascular;  Laterality: Left;   ULTRASOUND GUIDANCE FOR VASCULAR ACCESS Left 08/07/2022   Procedure: ULTRASOUND GUIDANCE FOR VASCULAR ACCESS;  Surgeon: Maeola Harman, MD;  Location: [facility] OR;  Service: Vascular;  Laterality: Left;   VARICOSE VEIN SURGERY  2008    stripping    I have reviewed the social history and family history with the patient and they are unchanged from previous note.  ALLERGIES:  is allergic to zestoretic [lisinopril-hydrochlorothiazide], codeine, oxycodone, ultram [tramadol], and zocor [simvastatin].  MEDICATIONS:  Current Outpatient Medications  Medication Sig Dispense Refill   acetaminophen (TYLENOL) 500 MG tablet Take 500 mg by mouth every 6 (six) hours as needed for moderate pain.     allopurinol (ZYLOPRIM) 300 MG tablet Take 1 tablet (300 mg total) by mouth daily. 90 tablet 3   aspirin EC 81 MG tablet Take 81 mg by mouth in the morning.     atorvastatin (LIPITOR) 80 MG tablet Take 80 mg by mouth daily.     clopidogrel (PLAVIX) 75 MG tablet Take 75 mg by mouth in the morning.     cyanocobalamin (VITAMIN B12) 1000 MCG tablet Take 1,000 mcg by mouth daily.     ezetimibe (ZETIA) 10 MG tablet TAKE 1 TABLET EVERY DAY (NEED MD APPOINTMENT) (Patient taking differently: Take 10 mg by mouth daily.) 90 tablet 0   ferrous sulfate 325 (65 FE) MG tablet Take 325 mg by mouth daily.     gabapentin (NEURONTIN) 300 MG capsule Take 300 mg by mouth 3 (three) times daily.     glucose blood test strip Check sugars twice daily 100 each 11   metFORMIN (GLUCOPHAGE) 500 MG tablet Take 500 mg by mouth in the morning and at bedtime.     oxyCODONE-acetaminophen (PERCOCET) 10-325 MG tablet Take 1 tablet by mouth every 4 (four) hours as needed for pain. (Patient taking differently: Take 1 tablet by mouth every 6 (six) hours as needed for pain.) 40 tablet 0   No current facility-administered medications for this visit.    PHYSICAL EXAMINATION: ECOG PERFORMANCE STATUS: 0 - Asymptomatic  Vitals:   03/28/23 1258  BP: (!) 155/72  Pulse: 87  Resp: 18  Temp: (!) 97.3 F (36.3 C)  SpO2: 99%   Wt Readings from Last 3 Encounters:  03/28/23 128 lb 4.8 oz (58.2 kg)  01/15/23 128 lb 14.4 oz (58.5 kg)  12/25/22 136 lb 4.8 oz (61.8 kg)      GENERAL:alert, no distress and comfortable SKIN: skin color, texture, turgor are normal, no rashes or significant lesions EYES: normal, Conjunctiva are pink and non-injected, sclera clear Musculoskeletal:no cyanosis of digits and no clubbing  NEURO: alert & oriented x 3 with fluent speech, no focal motor/sensory deficits   LABORATORY DATA:  I have reviewed the data as listed    Latest Ref Rng & Units 03/28/2023   12:29 PM 01/02/2023    8:49 AM 10/11/2022   10:43 AM  CBC  WBC 4.0 - 10.5 K/uL 9.2  11.5  10.4   Hemoglobin 12.0 - 15.0 g/dL 84.6  96.2  95.2   Hematocrit 36.0 - 46.0 % 36.2  32.5  35.7   Platelets 150 - 400 K/uL 304  333  308         Latest Ref Rng & Units 08/09/2022   12:52 AM 08/08/2022    5:58 AM 08/01/2022    9:30 AM  CMP  Glucose  70 - 99 mg/dL 147  829  562   BUN 8 - 23 mg/dL 16  17  11    Creatinine 0.44 - 1.00 mg/dL 1.30  8.65  7.84   Sodium 135 - 145 mmol/L 137  134  135   Potassium 3.5 - 5.1 mmol/L 3.6  3.7  3.9   Chloride 98 - 111 mmol/L 111  107  100   CO2 22 - 32 mmol/L 21  19  24    Calcium 8.9 - 10.3 mg/dL 7.9  8.0  8.7   Total Protein 6.5 - 8.1 g/dL   9.2   Total Bilirubin 0.3 - 1.2 mg/dL   0.4   Alkaline Phos 38 - 126 U/L   122   AST 15 - 41 U/L   15   ALT 0 - 44 U/L   11       RADIOGRAPHIC STUDIES: I have personally reviewed the radiological images as listed and agreed with the findings in the report. No results found.    No orders of the defined types were placed in this encounter.  All questions were answered. The patient knows to call the clinic with any problems, questions or concerns. No barriers to learning was detected. The total time spent in the appointment was 15 minutes.     Malachy Mood, MD 03/28/2023

## 2023-04-10 ENCOUNTER — Encounter: Payer: Self-pay | Admitting: Podiatry

## 2023-04-10 ENCOUNTER — Ambulatory Visit (INDEPENDENT_AMBULATORY_CARE_PROVIDER_SITE_OTHER): Payer: Medicare HMO | Admitting: Podiatry

## 2023-04-10 VITALS — Ht 63.0 in

## 2023-04-10 DIAGNOSIS — L84 Corns and callosities: Secondary | ICD-10-CM

## 2023-04-10 DIAGNOSIS — B351 Tinea unguium: Secondary | ICD-10-CM | POA: Diagnosis not present

## 2023-04-10 DIAGNOSIS — I739 Peripheral vascular disease, unspecified: Secondary | ICD-10-CM

## 2023-04-10 DIAGNOSIS — E119 Type 2 diabetes mellitus without complications: Secondary | ICD-10-CM

## 2023-04-10 DIAGNOSIS — M79675 Pain in left toe(s): Secondary | ICD-10-CM

## 2023-04-10 DIAGNOSIS — M79674 Pain in right toe(s): Secondary | ICD-10-CM | POA: Diagnosis not present

## 2023-04-10 DIAGNOSIS — E1142 Type 2 diabetes mellitus with diabetic polyneuropathy: Secondary | ICD-10-CM | POA: Diagnosis not present

## 2023-04-18 ENCOUNTER — Encounter: Payer: Self-pay | Admitting: Podiatry

## 2023-04-18 NOTE — Progress Notes (Signed)
ANNUAL DIABETIC FOOT EXAM  Subjective: April Shaffer presents today for annual diabetic foot exam.  Chief Complaint  Patient presents with   [facility]    She is here for a nail trim,  per patient last A1C was "6 something and was good", PCP is harris that works with Dr. Katrinka Blazing, last seen a few months ago.    She is followed by Vascular Surgery for PAD.  Patient confirms h/o diabetes.  Patient denies any h/o foot wounds.  Patient has been diagnosed with neuropathy.  Raymon Mutton., FNP is patient's PCP.  Past Medical History:  Diagnosis Date   Anemia    Arthritis    Blood transfusion without reported diagnosis    Carotid artery narrowing    Coronary artery disease    Cardiac catheterization November 2013: 50% ostial LAD stenosis 50% mid stenosis. 30% disease in the left circumflex.   Diabetes mellitus, type 2 (HCC)    Hyperlipidemia    Hypertension    Onychomycosis of toenail 07/31/2016   PAD (peripheral artery disease) (HCC)    Sleep apnea       Had surgery to correct   Thyroid nodule    Patient Active Problem List   Diagnosis Date Noted   Asymptomatic carotid artery stenosis without infarction, left 08/07/2022   Observation after surgery 08/07/2022   Septic arthritis of shoulder (HCC) 05/07/2022   Anemia of chronic disease 04/10/2022   Lumbar pseudoarthrosis 01/24/2022   Occlusion and stenosis of right carotid artery 01/24/2022   Iron deficiency anemia secondary to blood loss (chronic) 01/08/2022   B12 deficiency 08/31/2021   Medication monitoring encounter 12/12/2020   Nausea 12/12/2020   Hammer toes of both feet 11/09/2020   Acute maxillary sinusitis 08/08/2020   Hyperlipidemia 08/08/2020   Pain due to onychomycosis of toenails of both feet 08/08/2020   PICC (peripherally inserted central catheter) in place 06/22/2020   Therapeutic drug monitoring 06/22/2020   Wound infection after surgery 05/25/2020   Wound drainage 05/24/2020   Local infection of the  skin and subcutaneous tissue, unspecified 05/24/2020   Body mass index (BMI) 27.0-27.9, adult 05/10/2020   S/P lumbar fusion 04/25/2020   Microalbuminuria due to type 2 diabetes mellitus (HCC) 08/07/2019   Primary localized osteoarthritis of right hip 08/22/2018   Carpal tunnel syndrome, bilateral 08/06/2018   Type 2 diabetes mellitus with peripheral neuropathy (HCC) 08/06/2018   Degenerative joint disease of right hip 08/06/2018   Preoperative clearance 03/26/2018   Upper back pain on left side 10/30/2016   Encounter for postoperative carotid endarterectomy surveillance 12/07/2015   Varicosities of leg 06/03/2015   Acute left lumbar radiculopathy 04/15/2014   Chronic sciatica of right side 03/17/2013   Diabetic peripheral neuropathy (HCC) 03/17/2013   Coronary artery disease    Carotid artery stenosis s/p CEA 2013 10/16/2011   Carotid artery occlusion without infarction, right 09/17/2011   Carotid bruit 08/28/2011   Gout 04/13/2011   DM type 2 with diabetic peripheral neuropathy (HCC) 04/02/2008   Hyperlipidemia associated with type 2 diabetes mellitus (HCC) 04/02/2008   Essential hypertension 04/02/2008   DEGENERATIVE DISC DISEASE, LUMBOSACRAL SPINE 04/02/2008   Past Surgical History:  Procedure Laterality Date   ABDOMINAL AORTOGRAM W/LOWER EXTREMITY N/A 08/07/2021   Procedure: ABDOMINAL AORTOGRAM W/LOWER EXTREMITY;  Surgeon: Maeola Harman, MD;  Location: [facility] INVASIVE CV LAB;  Service: Cardiovascular;  Laterality: N/A;   ABDOMINAL AORTOGRAM W/LOWER EXTREMITY Right 10/23/2021   Procedure: ABDOMINAL AORTOGRAM W/LOWER EXTREMITY;  Surgeon: Lemar Livings  Cristal Deer, MD;  Location: [facility] INVASIVE CV LAB;  Service: Cardiovascular;  Laterality: Right;   ABDOMINAL HYSTERECTOMY     APPLICATION OF INTRAOPERATIVE CT SCAN N/A 11/30/2020   Procedure: APPLICATION OF INTRAOPERATIVE CT SCAN;  Surgeon: Tia Alert, MD;  Location: [facility] OR;  Service: Neurosurgery;  Laterality: N/A;   BACK  SURGERY     CARDIAC CATHETERIZATION     CATARACT EXTRACTION, BILATERAL Bilateral 2021   Dr. Joseph Art   ENDARTERECTOMY  09/27/2011   Procedure: RIGT ENDARTERECTOMY CAROTID;  Surgeon: Pryor Ochoa, MD;  Location: [facility] OR;  Service: Vascular;  Laterality: Right;   EPIDURAL BLOCK INJECTION  02/2008   Drs. Dorthula Nettles   EYE SURGERY Bilateral 2021   cataract   gyn surgery  2004   total hysterectomy for mennorhagia,,salpingoophorectomy   INCISION AND DRAINAGE ABSCESS Left 05/08/2022   Procedure: ARTHROSCOPIC INCISION AND DRAINAGE ABSCESS, DISTAL CLAVICLE EXCISSION, SUBACROMIAL DECOMPRESSION, LOOSE BODY REMOVAL, EXTENSIVE DEBRIDEMENT;  Surgeon: Sheral Apley, MD;  Location: [facility] ORS;  Service: Orthopedics;  Laterality: Left;   LAMINECTOMY WITH POSTERIOR LATERAL ARTHRODESIS LEVEL 2 N/A 11/30/2020   Procedure: LUMBAR FOUR-FIVE, LUMBAR FIVE-SACRAL ONE POSTERIOR LATERAL FUSION WITH REVISION OF LUMBAR ONE-FIVE HARDWARE AND EXTENSION TO SACRAL ONE AND SACRAL TWO;  Surgeon: Tia Alert, MD;  Location[facility] OR;  Service: Neurosurgery;  Laterality: N/A;   LUMBAR WOUND DEBRIDEMENT N/A 05/25/2020   Procedure: LUMBAR WOUND IRRIGATION AND DEBRIDEMENT;  Surgeon: Tia Alert, MD;  Location: [facility] OR;  Service: Neurosurgery;  Laterality: N/A;   PERIPHERAL VASCULAR INTERVENTION  08/07/2021   Procedure: PERIPHERAL VASCULAR INTERVENTION;  Surgeon: Maeola Harman, MD;  Location: [facility] INVASIVE CV LAB;  Service: Cardiovascular;;  Rt SFA   PERIPHERAL VASCULAR INTERVENTION Right 10/23/2021   Procedure: PERIPHERAL VASCULAR INTERVENTION;  Surgeon: Maeola Harman, MD;  Location: [facility] INVASIVE CV LAB;  Service: Cardiovascular;  Laterality: Right;  SFA   POSTERIOR LUMBAR FUSION 4 LEVEL N/A 04/25/2020   Procedure: POSTERIOR LUMBAR INTERBODY FUSION LUMBAR ONE-TWO, LUMBAR TWO-THREE, LUMBAR THREE-FOUR,LUMBAR FOUR-FIVE.;  Surgeon: Tia Alert, MD;  Location: [facility] OR;  Service: Neurosurgery;  Laterality:  N/A;  posterior   SHOULDER ARTHROSCOPY Left 05/08/2022   Procedure: ARTHROSCOPY SHOULDER;  Surgeon: Sheral Apley, MD;  Location: [facility] ORS;  Service: Orthopedics;  Laterality: Left;   TONSILLECTOMY     TOTAL ABDOMINAL HYSTERECTOMY W/ BILATERAL SALPINGOOPHORECTOMY Bilateral 2000   TOTAL HIP ARTHROPLASTY Right 08/22/2018   Procedure: TOTAL HIP ARTHROPLASTY;  Surgeon: Frederico Hamman, MD;  Location: [facility] ORS;  Service: Orthopedics;  Laterality: Right;   TRANSCAROTID ARTERY REVASCULARIZATION  Left 08/07/2022   Procedure: Transcarotid Artery Revascularization;  Surgeon: Maeola Harman, MD;  Location: [facility] OR;  Service: Vascular;  Laterality: Left;   ULTRASOUND GUIDANCE FOR VASCULAR ACCESS Left 08/07/2022   Procedure: ULTRASOUND GUIDANCE FOR VASCULAR ACCESS;  Surgeon: Maeola Harman, MD;  Location: [facility] OR;  Service: Vascular;  Laterality: Left;   VARICOSE VEIN SURGERY  2008   stripping   Current Outpatient Medications on File Prior to Visit  Medication Sig Dispense Refill   acetaminophen (TYLENOL) 500 MG tablet Take 500 mg by mouth every 6 (six) hours as needed for moderate pain.     allopurinol (ZYLOPRIM) 300 MG tablet Take 1 tablet (300 mg total) by mouth daily. 90 tablet 3   aspirin EC 81 MG tablet Take 81 mg by mouth in the morning.     atorvastatin (LIPITOR) 80 MG tablet Take 80 mg by mouth daily.  clopidogrel (PLAVIX) 75 MG tablet Take 75 mg by mouth in the morning.     cyanocobalamin (VITAMIN B12) 1000 MCG tablet Take 1,000 mcg by mouth daily.     ezetimibe (ZETIA) 10 MG tablet TAKE 1 TABLET EVERY DAY (NEED MD APPOINTMENT) (Patient taking differently: Take 10 mg by mouth daily.) 90 tablet 0   ferrous sulfate 325 (65 FE) MG tablet Take 325 mg by mouth daily.     gabapentin (NEURONTIN) 300 MG capsule Take 300 mg by mouth 3 (three) times daily.     glucose blood test strip Check sugars twice daily 100 each 11   metFORMIN (GLUCOPHAGE) 500 MG tablet Take 500 mg by mouth in  the morning and at bedtime.     oxyCODONE-acetaminophen (PERCOCET) 10-325 MG tablet Take 1 tablet by mouth every 4 (four) hours as needed for pain. (Patient taking differently: Take 1 tablet by mouth every 6 (six) hours as needed for pain.) 40 tablet 0   No current facility-administered medications on file prior to visit.    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    Comment: [employer]-retired 07/23/2014  Tobacco Use   Smoking status: Every Day    Current packs/day: 0.25    Average packs/day: 0.3 packs/day for 54.9 years (13.7 ttl pk-yrs)    Types: Cigarettes    Start date: 05/31/1968    Passive exposure: Never   Smokeless tobacco: Never   Tobacco comments:    Smoked x50 years, 1 pack would last 3 days. Currently smoking 4-5 cigarettes per day  Vaping Use   Vaping status: Never Used  Substance and Sexual Activity   Alcohol use: Yes    Alcohol/week: 1.0 standard drink of alcohol    Types: 1 Cans of beer per week    Comment: 1-2 drinks every other day   Drug use: No   Sexual activity: Not on file   Family History  Problem Relation Age of Onset   Hypertension Sister    Hypothyroidism Sister    Cancer Maternal Grandmother        lung   Hypertension Son    Hypertension Sister    Diabetes Brother        lost toe in 2018 with new diagnosis of DM   Hypertension Son    Immunization History  Administered Date(s) Administered   Influenza Inj Mdck Quad Pf 01/31/2016   Influenza Split 12/10/2011   Influenza Whole 12/28/2007, 11/26/2008, 02/03/2010   Influenza, High Dose Seasonal PF 11/24/2017, 12/04/2018, 01/20/2019    Influenza,inj,Quad PF,6+ Mos 12/14/2014, 11/25/2016   Influenza-Unspecified 12/21/2013, 11/24/2016   PFIZER(Purple Top)SARS-COV-2 Vaccination 04/21/2019, 05/12/2019, 12/13/2019   Pneumococcal Conjugate-13 04/15/2014   Pneumococcal Polysaccharide-23 02/27/2003, 02/03/2010, 06/17/2017   Tdap 08/08/2010   Zoster Recombinant(Shingrix) 12/23/2017, 03/11/2018     Review of Systems: Negative except as noted in the HPI.   Objective: There were no vitals filed for this visit.  April Shaffer is a pleasant 73 y.o. female in NAD. AAO X 3.  Diabetic foot exam was performed with the following findings:   Vascular Examination: CFT <3 seconds b/l. Pedal pulses RLE  nonpalpable. Pedal pulses LLE faintly palpable. Digital hair absent. Skin temperature gradient warm to cool b/l. No pain with calf compression. No ischemia or gangrene. No cyanosis or clubbing noted b/l.    Neurological Examination: Protective sensation decreased with 10 gram monofilament b/l. Vibratory sensation decreased b/l.  Dermatological Examination: Pedal skin warm and supple b/l. No open wounds b/l. No interdigital macerations. Toenails 1-5 b/l thick, discolored, elongated with subungual debris and pain on dorsal palpation. Hyperkeratotic lesion(s) left great toe.  No erythema, no edema, no drainage, no fluctuance.     Musculoskeletal Examination: Muscle strength 5/5 to all lower extremity muscle groups bilaterally. No pain, crepitus or joint limitation noted with ROM bilateral LE.  Radiographs: None     Lab Results  Component Value Date   HGBA1C 6.0 (H) 08/01/2022   ADA Risk Categorization: High Risk  Patient has one or more of the following: Loss of protective sensation Absent pedal pulses Severe Foot deformity History of foot ulcer  Assessment: 1. Pain due to onychomycosis of toenails of both feet   2. Callus   3. PAD (peripheral artery disease) (HCC)   4. DM type 2 with diabetic peripheral neuropathy (HCC)    5. Encounter for diabetic foot exam (HCC)     Plan: Diabetic foot examination performed today. All patient's and/or POA's questions/concerns addressed on today's visit. Toenails 1-5 debrided in length and girth without incident. Continue regular follow up with Vascular Surgery. Callus(es) left great toe pared with sharp debridement without incident. Continue daily foot inspections and monitor blood glucose per PCP/Endocrinologist's recommendations.Continue soft, supportive shoe gear daily. Report any pedal injuries to medical professional. Call office if there are any questions/concerns. -Patient/POA to call should there be question/concern in the interim. Return in about 4 months (around 08/08/2023).  Freddie Breech, DPM      [place] LOCATION: [address] [place] [address]                   Office [phone]   [facility] LOCATION: [address] [place], [place] [address] Office [phone]

## 2023-04-24 ENCOUNTER — Inpatient Hospital Stay: Payer: Medicare HMO | Attending: Nurse Practitioner

## 2023-04-24 VITALS — BP 126/59 | HR 83 | Temp 98.0°F | Resp 17

## 2023-04-24 DIAGNOSIS — D508 Other iron deficiency anemias: Secondary | ICD-10-CM | POA: Diagnosis present

## 2023-04-24 DIAGNOSIS — E538 Deficiency of other specified B group vitamins: Secondary | ICD-10-CM | POA: Insufficient documentation

## 2023-04-24 MED ORDER — CYANOCOBALAMIN 1000 MCG/ML IJ SOLN
1000.0000 ug | Freq: Once | INTRAMUSCULAR | Status: AC
  Administered 2023-04-24: 1000 ug via INTRAMUSCULAR
  Filled 2023-04-24: qty 1

## 2023-05-15 ENCOUNTER — Ambulatory Visit (INDEPENDENT_AMBULATORY_CARE_PROVIDER_SITE_OTHER): Payer: Medicare HMO | Admitting: Vascular Surgery

## 2023-05-15 ENCOUNTER — Ambulatory Visit (HOSPITAL_COMMUNITY)
Admission: RE | Admit: 2023-05-15 | Discharge: 2023-05-15 | Disposition: A | Discharge status: Home / Self Care | Payer: Medicare HMO | Source: Ambulatory Visit | Attending: Vascular Surgery | Admitting: Vascular Surgery

## 2023-05-15 ENCOUNTER — Encounter: Payer: Self-pay | Admitting: Vascular Surgery

## 2023-05-15 ENCOUNTER — Ambulatory Visit (INDEPENDENT_AMBULATORY_CARE_PROVIDER_SITE_OTHER)
Admission: RE | Admit: 2023-05-15 | Discharge: 2023-05-15 | Disposition: A | Discharge status: Home / Self Care | Payer: Medicare HMO | Source: Ambulatory Visit | Attending: Vascular Surgery | Admitting: Vascular Surgery

## 2023-05-15 VITALS — BP 113/72 | HR 73 | Temp 98.0°F | Ht 63.0 in | Wt 126.0 lb

## 2023-05-15 DIAGNOSIS — I739 Peripheral vascular disease, unspecified: Secondary | ICD-10-CM

## 2023-05-15 DIAGNOSIS — I6523 Occlusion and stenosis of bilateral carotid arteries: Secondary | ICD-10-CM | POA: Diagnosis not present

## 2023-05-15 NOTE — Progress Notes (Signed)
 Patient ID: April Shaffer, female   DOB: 1950-05-04, 73 y.o.   MRN: [id number]  Reason for Consult: Follow-up   Referred by Raymon Mutton., FNP  Subjective:     HPI:  April Shaffer is a 73 y.o. female history of right SFA stenting for rest pain that required reintervention of the stent 2 months later but last intervention was in 2023.  She also has a remote history of right CEA over 10 years ago and left TCAR last year.  She continues on Plavix.  She has pain in her left heel particularly at night but no tissue loss or ulceration and walks currently without limitation.  Past Medical History:  Diagnosis Date   Anemia    Arthritis    Blood transfusion without reported diagnosis    Carotid artery narrowing    Coronary artery disease    Cardiac catheterization November 2013: 50% ostial LAD stenosis 50% mid stenosis. 30% disease in the left circumflex.   Diabetes mellitus, type 2 (HCC)    Hyperlipidemia    Hypertension    Onychomycosis of toenail 07/31/2016   PAD (peripheral artery disease) (HCC)    Sleep apnea       Had surgery to correct   Thyroid nodule    Family History  Problem Relation Age of Onset   Hypertension Sister    Hypothyroidism Sister    Cancer Maternal Grandmother        lung   Hypertension Son    Hypertension Sister    Diabetes Brother        lost toe in 2018 with new diagnosis of DM   Hypertension Son    Past Surgical History:  Procedure Laterality Date   ABDOMINAL AORTOGRAM W/LOWER EXTREMITY N/A 08/07/2021   Procedure: ABDOMINAL AORTOGRAM W/LOWER EXTREMITY;  Surgeon: Maeola Harman, MD;  Location: [facility] INVASIVE CV LAB;  Service: Cardiovascular;  Laterality: N/A;   ABDOMINAL AORTOGRAM W/LOWER EXTREMITY Right 10/23/2021   Procedure: ABDOMINAL AORTOGRAM W/LOWER EXTREMITY;  Surgeon: Maeola Harman, MD;  Location: [facility] INVASIVE CV LAB;  Service: Cardiovascular;  Laterality: Right;   ABDOMINAL HYSTERECTOMY     APPLICATION OF  INTRAOPERATIVE CT SCAN N/A 11/30/2020   Procedure: APPLICATION OF INTRAOPERATIVE CT SCAN;  Surgeon: Tia Alert, MD;  Location: [facility] OR;  Service: Neurosurgery;  Laterality: N/A;   BACK SURGERY     CARDIAC CATHETERIZATION     CATARACT EXTRACTION, BILATERAL Bilateral 2021   Dr. Joseph Art   ENDARTERECTOMY  09/27/2011   Procedure: RIGT ENDARTERECTOMY CAROTID;  Surgeon: Pryor Ochoa, MD;  Location: [facility] OR;  Service: Vascular;  Laterality: Right;   EPIDURAL BLOCK INJECTION  02/2008   Drs. Dorthula Nettles   EYE SURGERY Bilateral 2021   cataract   gyn surgery  2004   total hysterectomy for mennorhagia,,salpingoophorectomy   INCISION AND DRAINAGE ABSCESS Left 05/08/2022   Procedure: ARTHROSCOPIC INCISION AND DRAINAGE ABSCESS, DISTAL CLAVICLE EXCISSION, SUBACROMIAL DECOMPRESSION, LOOSE BODY REMOVAL, EXTENSIVE DEBRIDEMENT;  Surgeon: Sheral Apley, MD;  Location: [facility] ORS;  Service: Orthopedics;  Laterality: Left;   LAMINECTOMY WITH POSTERIOR LATERAL ARTHRODESIS LEVEL 2 N/A 11/30/2020   Procedure: LUMBAR FOUR-FIVE, LUMBAR FIVE-SACRAL ONE POSTERIOR LATERAL FUSION WITH REVISION OF LUMBAR ONE-FIVE HARDWARE AND EXTENSION TO SACRAL ONE AND SACRAL TWO;  Surgeon: Tia Alert, MD;  Location: [facility] OR;  Service: Neurosurgery;  Laterality: N/A;   LUMBAR WOUND DEBRIDEMENT N/A 05/25/2020   Procedure: LUMBAR WOUND IRRIGATION AND DEBRIDEMENT;  Surgeon: Tia Alert, MD;  Location: [facility] OR;  Service: Neurosurgery;  Laterality: N/A;   PERIPHERAL VASCULAR INTERVENTION  08/07/2021   Procedure: PERIPHERAL VASCULAR INTERVENTION;  Surgeon: Maeola Harman, MD;  Location: [facility] INVASIVE CV LAB;  Service: Cardiovascular;;  Rt SFA   PERIPHERAL VASCULAR INTERVENTION Right 10/23/2021   Procedure: PERIPHERAL VASCULAR INTERVENTION;  Surgeon: Maeola Harman, MD;  Location: [facility] INVASIVE CV LAB;  Service: Cardiovascular;  Laterality: Right;  SFA   POSTERIOR LUMBAR FUSION 4 LEVEL N/A 04/25/2020   Procedure:  POSTERIOR LUMBAR INTERBODY FUSION LUMBAR ONE-TWO, LUMBAR TWO-THREE, LUMBAR THREE-FOUR,LUMBAR FOUR-FIVE.;  Surgeon: Tia Alert, MD;  Location: [facility] OR;  Service: Neurosurgery;  Laterality: N/A;  posterior   SHOULDER ARTHROSCOPY Left 05/08/2022   Procedure: ARTHROSCOPY SHOULDER;  Surgeon: Sheral Apley, MD;  Location: [facility] ORS;  Service: Orthopedics;  Laterality: Left;   TONSILLECTOMY     TOTAL ABDOMINAL HYSTERECTOMY W/ BILATERAL SALPINGOOPHORECTOMY Bilateral 2000   TOTAL HIP ARTHROPLASTY Right 08/22/2018   Procedure: TOTAL HIP ARTHROPLASTY;  Surgeon: Frederico Hamman, MD;  Location: [facility] ORS;  Service: Orthopedics;  Laterality: Right;   TRANSCAROTID ARTERY REVASCULARIZATION  Left 08/07/2022   Procedure: Transcarotid Artery Revascularization;  Surgeon: Maeola Harman, MD;  Location: [facility] OR;  Service: Vascular;  Laterality: Left;   ULTRASOUND GUIDANCE FOR VASCULAR ACCESS Left 08/07/2022   Procedure: ULTRASOUND GUIDANCE FOR VASCULAR ACCESS;  Surgeon: Maeola Harman, MD;  Location: [facility] OR;  Service: Vascular;  Laterality: Left;   VARICOSE VEIN SURGERY  2008   stripping    Short Social History:  Social History   Tobacco Use   Smoking status: Every Day    Current packs/day: 0.25    Average packs/day: 0.3 packs/day for 55.0 years (13.7 ttl pk-yrs)    Types: Cigarettes    Start date: 05/31/1968    Passive exposure: Never   Smokeless tobacco: Never   Tobacco comments:    Smoked x50 years, 1 pack would last 3 days. Currently smoking 4-5 cigarettes per day  Substance Use Topics   Alcohol use: Yes    Alcohol/week: 1.0 standard drink of alcohol    Types: 1 Cans of beer per week    Comment: 1-2 drinks every other day    Allergies  Allergen Reactions   Zestoretic [Lisinopril-Hydrochlorothiazide] Swelling    Angioedema  - Tongue swelling    Codeine Itching   Oxycodone Itching    Patient is taking at this time   Ultram [Tramadol] Itching   Zocor [Simvastatin] Other (See  Comments)    Muscle pain    Current Outpatient Medications  Medication Sig Dispense Refill   acetaminophen (TYLENOL) 500 MG tablet Take 500 mg by mouth every 6 (six) hours as needed for moderate pain.     allopurinol (ZYLOPRIM) 300 MG tablet Take 1 tablet (300 mg total) by mouth daily. 90 tablet 3   aspirin EC 81 MG tablet Take 81 mg by mouth in the morning.     atorvastatin (LIPITOR) 80 MG tablet Take 80 mg by mouth daily.     clopidogrel (PLAVIX) 75 MG tablet Take 75 mg by mouth in the morning.     cyanocobalamin (VITAMIN B12) 1000 MCG tablet Take 1,000 mcg by mouth daily.     ezetimibe (ZETIA) 10 MG tablet TAKE 1 TABLET EVERY DAY (NEED MD APPOINTMENT) (Patient taking differently: Take 10 mg by mouth daily.) 90 tablet 0   ferrous sulfate 325 (65 FE) MG tablet Take 325 mg by mouth daily.     gabapentin (NEURONTIN) 300  MG capsule Take 300 mg by mouth 3 (three) times daily.     glucose blood test strip Check sugars twice daily 100 each 11   metFORMIN (GLUCOPHAGE) 500 MG tablet Take 500 mg by mouth in the morning and at bedtime.     oxyCODONE-acetaminophen (PERCOCET) 10-325 MG tablet Take 1 tablet by mouth every 4 (four) hours as needed for pain. (Patient taking differently: Take 1 tablet by mouth every 6 (six) hours as needed for pain.) 40 tablet 0   No current facility-administered medications for this visit.    Review of Systems  Constitutional:  Constitutional negative. HENT: HENT negative.  Eyes: Eyes negative.  Respiratory: Respiratory negative.  Cardiovascular: Cardiovascular negative.  GI: Gastrointestinal negative.  Musculoskeletal:       Left heel pain Skin: Skin negative.  Neurological: Positive for numbness.  Hematologic: Hematologic/lymphatic negative.  Psychiatric: Psychiatric negative.        Objective:  Objective   Vitals:   05/15/23 1144  BP: 113/72  Pulse: 73  Temp: 98 F (36.7 C)  SpO2: 97%  Weight: 126 lb (57.2 kg)  Height: 5\' 3"  (1.6 m)   Body  mass index is 22.32 kg/m.  Physical Exam HENT:     Head: Normocephalic.     Nose: Nose normal.  Eyes:     Pupils: Pupils are equal, round, and reactive to light.  Cardiovascular:     Pulses:          Femoral pulses are 2+ on the right side and 2+ on the left side.      Popliteal pulses are 0 on the right side and 0 on the left side.  Pulmonary:     Effort: Pulmonary effort is normal.  Abdominal:     General: Abdomen is flat.  Musculoskeletal:     Right lower leg: No edema.     Left lower leg: No edema.  Skin:    General: Skin is warm.     Capillary Refill: Capillary refill takes less than 2 seconds.  Neurological:     General: No focal deficit present.     Mental Status: She is alert.  Psychiatric:        Mood and Affect: Mood normal.        Thought Content: Thought content normal.        Judgment: Judgment normal.     Data: Right Carotid Findings:  +----------+--------+--------+--------+------------------+--------+           PSV cm/sEDV cm/sStenosisPlaque DescriptionComments  +----------+--------+--------+--------+------------------+--------+  CCA Prox  88      7                                           +----------+--------+--------+--------+------------------+--------+  CCA Mid   117     13                                          +----------+--------+--------+--------+------------------+--------+  CCA Distal125     12                                          +----------+--------+--------+--------+------------------+--------+  ICA Prox  Occluded                            +----------+--------+--------+--------+------------------+--------+  ICA Mid                   Occluded                            +----------+--------+--------+--------+------------------+--------+  ICA Distal                Occluded                            +----------+--------+--------+--------+------------------+--------+   ECA      160     17                                          +----------+--------+--------+--------+------------------+--------+   +----------+--------+-------+----------------+-------------------+           PSV cm/sEDV cmsDescribe        Arm Pressure (mmHG)  +----------+--------+-------+----------------+-------------------+  Subclavian150           Multiphasic, WNL130                  +----------+--------+-------+----------------+-------------------+   +---------+--------+--+--------+--+---------+  VertebralPSV cm/s62EDV cm/s20Antegrade  +---------+--------+--+--------+--+---------+      Left Carotid Findings:  +----------+--------+--------+--------+------------------+--------+           PSV cm/sEDV cm/sStenosisPlaque DescriptionComments  +----------+--------+--------+--------+------------------+--------+  CCA Prox  95      31                                          +----------+--------+--------+--------+------------------+--------+  CCA Mid   97      38                                          +----------+--------+--------+--------+------------------+--------+  CCA Distal                                          stent     +----------+--------+--------+--------+------------------+--------+  ICA Prox                                            stent     +----------+--------+--------+--------+------------------+--------+  ICA Mid                                             stent     +----------+--------+--------+--------+------------------+--------+  ICA Distal119     47                                          +----------+--------+--------+--------+------------------+--------+  ECA      64                                                  +----------+--------+--------+--------+------------------+--------+   +----------+--------+--------+----------------+-------------------+  PSV cm/sEDV  cm/sDescribe        Arm Pressure (mmHG)  +----------+--------+--------+----------------+-------------------+  [id number]            Multiphasic, FTD322                  +----------+--------+--------+----------------+-------------------+   +---------+--------+--+--------+--+---------+  VertebralPSV cm/s88EDV cm/s32Antegrade  +---------+--------+--+--------+--+---------+      Left Stent(s):  +---------------+---+--++++  Prox to Stent  97 38  +---------------+---+--++++  Proximal Stent 86 28  +---------------+---+--++++  Mid Stent      93 32  +---------------+---+--++++  Distal Stent   10334  +---------------+---+--++++  Distal to Stent82 26  +---------------+---+--++++       Summary:  Right Carotid: Evidence consistent with a total occlusion of the right  ICA.   Left Carotid: There is no evidence of stenosis in the left ICA. Widely  patent               stent.   Vertebrals:  Bilateral vertebral arteries demonstrate antegrade flow.  Subclavians: Normal flow hemodynamics were seen in bilateral subclavian               arteries.   ABI Findings:  +---------+------------------+-----+--------+--------+  Right   Rt Pressure (mmHg)IndexWaveformComment   +---------+------------------+-----+--------+--------+  Brachial 126                                      +---------+------------------+-----+--------+--------+  PTA     101               0.80 biphasic          +---------+------------------+-----+--------+--------+  DP      100               0.79 biphasic          +---------+------------------+-----+--------+--------+  Great Toe72                0.57                   +---------+------------------+-----+--------+--------+   +---------+------------------+-----+----------+-------+  Left    Lt Pressure (mmHg)IndexWaveform  Comment  +---------+------------------+-----+----------+-------+   Brachial 126                                       +---------+------------------+-----+----------+-------+  PTA     139               1.10 biphasic           +---------+------------------+-----+----------+-------+  DP      138               1.10 monophasic         +---------+------------------+-----+----------+-------+  Great Toe74                0.59                    +---------+------------------+-----+----------+-------+   +-------+-----------+-----------+------------+------------+  ABI/TBIToday's ABIToday's TBIPrevious ABIPrevious TBI  +-------+-----------+-----------+------------+------------+  Right 0.80       0.57       1.08        0.76          +-------+-----------+-----------+------------+------------+  Left  1.10       0.59       1.07        0.55          +-------+-----------+-----------+------------+------------+  Right ABIs appear decreased. Left ABIs appear essentially unchanged  compared to prior study on 01/08/2023.    Summary:  Right: Resting right ankle-brachial index indicates mild right lower  extremity arterial disease. The right toe-brachial index is abnormal.   Left: Resting left ankle-brachial index is within normal range. The left  toe-brachial index is abnormal.   RIGHT     PSV cm/sRatioStenosis       WaveformComments  +----------+--------+-----+---------------+--------+--------+  CFA Distal107                         biphasic          +----------+--------+-----+---------------+--------+--------+  SFA Mid   380          50-74% stenosis                  +----------+--------+-----+---------------+--------+--------+  POP Prox  109                                           +----------+--------+-----+---------------+--------+--------+  POP Mid   126          30-49% stenosis                  +----------+--------+-----+---------------+--------+--------+  POP Distal131                                            +----------+--------+-----+---------------+--------+--------+   A focal velocity elevation of was obtained at mid SFA. Findings are  characteristic of 50-74% stenosis.     Right Stent(s):  +---------------+--------+--------+----------+--------+  proximal SFA   PSV cm/sStenosisWaveform  Comments  +---------------+--------+--------+----------+--------+  Prox to Stent  101             biphasic            +---------------+--------+--------+----------+--------+  Proximal Stent 85              monophasic          +---------------+--------+--------+----------+--------+  Mid Stent      43              biphasic            +---------------+--------+--------+----------+--------+  Distal Stent   95              biphasic            +---------------+--------+--------+----------+--------+  Distal to Stent106             biphasic            +---------------+--------+--------+----------+--------+     +---------------+--------+--------+----------+--------+  distal SFA     PSV cm/sStenosisWaveform  Comments  +---------------+--------+--------+----------+--------+  Prox to Stent  60              biphasic            +---------------+--------+--------+----------+--------+  Proximal Stent 33              biphasic            +---------------+--------+--------+----------+--------+  Mid Stent      39              monophasic          +---------------+--------+--------+----------+--------+  Distal Stent   41  monophasic          +---------------+--------+--------+----------+--------+  Distal to Stent26              monophasic          +---------------+--------+--------+----------+--------+        Summary:  Right: Patent proximal and distal SFA stenting.  Mid SFA velocities suggest a 50-74% stenosis.  Popliteal disease visualized with velocities suggesting <50% stenosis.       Assessment/Plan:     73 year old female with history as above.  Plan will be to follow her up with carotid duplex and right lower extremity arterial duplex in 1 year unless she has issues prior.  All questions answered she demonstrates good understanding.     Maeola Harman MD [facility]

## 2023-05-16 LAB — VAS US ABI WITH/WO TBI
Left ABI: 1.1
Right ABI: 0.8

## 2023-05-22 ENCOUNTER — Inpatient Hospital Stay: Payer: Medicare HMO | Attending: Nurse Practitioner

## 2023-05-22 ENCOUNTER — Other Ambulatory Visit: Payer: Self-pay

## 2023-05-22 DIAGNOSIS — E538 Deficiency of other specified B group vitamins: Secondary | ICD-10-CM

## 2023-05-22 DIAGNOSIS — D508 Other iron deficiency anemias: Secondary | ICD-10-CM | POA: Diagnosis present

## 2023-05-22 MED ORDER — CYANOCOBALAMIN 1000 MCG/ML IJ SOLN
1000.0000 ug | Freq: Once | INTRAMUSCULAR | Status: AC
  Administered 2023-05-22: 1000 ug via INTRAMUSCULAR
  Filled 2023-05-22: qty 1

## 2023-06-19 ENCOUNTER — Inpatient Hospital Stay: Payer: Medicare HMO

## 2023-06-19 ENCOUNTER — Inpatient Hospital Stay: Payer: Medicare HMO | Attending: Nurse Practitioner

## 2023-06-19 DIAGNOSIS — E538 Deficiency of other specified B group vitamins: Secondary | ICD-10-CM

## 2023-06-19 DIAGNOSIS — D508 Other iron deficiency anemias: Secondary | ICD-10-CM | POA: Insufficient documentation

## 2023-06-19 DIAGNOSIS — D500 Iron deficiency anemia secondary to blood loss (chronic): Secondary | ICD-10-CM

## 2023-06-19 DIAGNOSIS — D638 Anemia in other chronic diseases classified elsewhere: Secondary | ICD-10-CM

## 2023-06-19 LAB — CBC WITH DIFFERENTIAL (CANCER CENTER ONLY)
Abs Immature Granulocytes: 0.04 10*3/uL (ref 0.00–0.07)
Basophils Absolute: 0.1 10*3/uL (ref 0.0–0.1)
Basophils Relative: 1 %
Eosinophils Absolute: 0.1 10*3/uL (ref 0.0–0.5)
Eosinophils Relative: 1 %
HCT: 25.6 % — ABNORMAL LOW (ref 36.0–46.0)
Hemoglobin: 8.2 g/dL — ABNORMAL LOW (ref 12.0–15.0)
Immature Granulocytes: 0 %
Lymphocytes Relative: 16 %
Lymphs Abs: 1.6 10*3/uL (ref 0.7–4.0)
MCH: 27 pg (ref 26.0–34.0)
MCHC: 32 g/dL (ref 30.0–36.0)
MCV: 84.2 fL (ref 80.0–100.0)
Monocytes Absolute: 0.7 10*3/uL (ref 0.1–1.0)
Monocytes Relative: 7 %
Neutro Abs: 7.8 10*3/uL — ABNORMAL HIGH (ref 1.7–7.7)
Neutrophils Relative %: 75 %
Platelet Count: 368 10*3/uL (ref 150–400)
RBC: 3.04 MIL/uL — ABNORMAL LOW (ref 3.87–5.11)
RDW: 16.5 % — ABNORMAL HIGH (ref 11.5–15.5)
WBC Count: 10.3 10*3/uL (ref 4.0–10.5)
nRBC: 0 % (ref 0.0–0.2)

## 2023-06-19 LAB — IRON AND IRON BINDING CAPACITY (CC-WL,HP ONLY)
Iron: 23 ug/dL — ABNORMAL LOW (ref 28–170)
Saturation Ratios: 10 % — ABNORMAL LOW (ref 10.4–31.8)
TIBC: 234 ug/dL — ABNORMAL LOW (ref 250–450)
UIBC: 211 ug/dL (ref 148–442)

## 2023-06-19 LAB — FERRITIN: Ferritin: 361 ng/mL — ABNORMAL HIGH (ref 11–307)

## 2023-06-19 LAB — VITAMIN B12: Vitamin B-12: 516 pg/mL (ref 180–914)

## 2023-06-19 MED ORDER — CYANOCOBALAMIN 1000 MCG/ML IJ SOLN
1000.0000 ug | Freq: Once | INTRAMUSCULAR | Status: AC
  Administered 2023-06-19: 1000 ug via INTRAMUSCULAR
  Filled 2023-06-19: qty 1

## 2023-07-02 ENCOUNTER — Telehealth: Payer: Self-pay | Admitting: Podiatry

## 2023-07-02 NOTE — Telephone Encounter (Signed)
 Patient would like to speak with you regarding pan she has experienced and would like your opinion on what she should do. Patient contact telephone number, [phone]

## 2023-07-03 ENCOUNTER — Telehealth (INDEPENDENT_AMBULATORY_CARE_PROVIDER_SITE_OTHER): Admitting: Podiatry

## 2023-07-03 NOTE — Telephone Encounter (Signed)
 Returned patient's phone call. States she has a hard spot on the side of her foot which feels like a hard piece of skin she is unable to get off. She is scheduled to have hip surgery on May 20th. States she was unable to get an appointment with me after her last visit. Was told someone would call to schedule her (cancellation list?), but she never received a call. I informed her she may have to see another provider as my schedule is full. Patient related understanding and will await call from our call center today to be scheduled before she has hip surgery.

## 2023-07-05 ENCOUNTER — Ambulatory Visit (INDEPENDENT_AMBULATORY_CARE_PROVIDER_SITE_OTHER): Admitting: Podiatrist

## 2023-07-05 ENCOUNTER — Encounter: Payer: Self-pay | Admitting: Podiatrist

## 2023-07-05 VITALS — Ht <= 58 in | Wt 126.0 lb

## 2023-07-05 DIAGNOSIS — M7662 Achilles tendinitis, left leg: Secondary | ICD-10-CM

## 2023-07-05 DIAGNOSIS — M7751 Other enthesopathy of right foot: Secondary | ICD-10-CM | POA: Diagnosis not present

## 2023-07-05 NOTE — Progress Notes (Signed)
 Chief Complaint  Patient presents with   Diabetes    Patient is here for callous by fifth meta. on right foot   heel pain at night on left foot     HPI: Patient is 73 y.o. female who presents today for a painful fifth metatarsal base of the right foot.  She relates it has been hurting her for several weeks duration.  She also has pain on the posterior medial aspect of the Achilles tendon insertion on the left heel.  Relates there is pain at night on the left heel.    Allergies  Allergen Reactions   Zestoretic  [Lisinopril -Hydrochlorothiazide ] Swelling    Angioedema  - Tongue swelling    Codeine Itching   Oxycodone  Itching    Patient is taking at this time   Ultram  [Tramadol ] Itching   Zocor  [Simvastatin ] Other (See Comments)    Muscle pain    Review of systems is negative except as noted in the HPI.  Denies nausea/ vomiting/ fevers/ chills or night sweats.   Denies difficulty breathing, denies calf pain or tenderness  Physical Exam  Patient is awake, alert, and oriented x 3.  In no acute distress.    Vascular status is intact with palpable pedal pulses 1/4 DP and PT bilateral and capillary refill time less than 3 seconds bilateral.  No edema or erythema noted.   Neurological exam reveals epicritic and protective sensation grossly intact bilateral.   Dermatological exam reveals skin is supple and dry to bilateral feet.  Callus present submetatarsal 5 of the right foot.  Pain with direct pressure is noted.  Small callus medial aspect of the left heel is also present.  Musculoskeletal exam: Pain on palpation of the fifth metatarsal head of the right foot is noted.  Some swelling is present in the area consistent with inflammation.  Left heel pain is at the medial aspect of the insertion of the Achilles tendon pain with direct pressure is noted  Assessment:   ICD-10-CM   1. Bursitis of right foot  M77.51     2. Left Achilles tendinitis  M76.62        Plan: Discussed treatment  options and recommendations.  At today's visit I recommended an injection of dexamethasone  into the fifth metatarsal head to help decrease inflammation she agreed to put this in with alcohol.  3 mg dexamethasone  phosphate with 0.5% Marcaine  plain into the right foot without complication.  Dispensed a silicone sleeve for the Achilles tendon pain.  Trim the nails as a courtesy today.  Also trim the calluses.  She will be seen back in 3 months for follow-up with Dr. Johnette Naegeli if any problems arise she will call.  Of note she is getting her left hip replaced in about a week.

## 2023-07-05 NOTE — Progress Notes (Signed)
 COVID Vaccine Completed: yes   Date of COVID positive in last 90 days:  PCP - Gaetana Jones, FNP Cardiologist - Lauro Portal, MD  Hematology clearance by Sonja Salem, MD in media tab dated 06/20/23  Chest x-ray -  EKG -  Stress Test -  ECHO -  Cardiac Cath -  Pacemaker/ICD device last checked: Spinal Cord Stimulator:  Bowel Prep -   Sleep Study -  CPAP -   Fasting Blood Sugar -  Checks Blood Sugar _____ times a day  Last dose of GLP1 agonist-  N/A GLP1 instructions:  Hold 7 days before surgery    Last dose of SGLT-2 inhibitors-  N/A SGLT-2 instructions:  Hold 3 days before surgery    Blood Thinner Instructions: Plavix  Aspirin  Instructions: ASA 81 Last Dose:  Activity level:  Can go up a flight of stairs and perform activities of daily living without stopping and without symptoms of chest pain or shortness of breath.  Able to exercise without symptoms  Unable to go up a flight of stairs without symptoms of     Anesthesia review: HTN, CAD, DM2, OSA  Patient denies shortness of breath, fever, cough and chest pain at PAT appointment  Patient verbalized understanding of instructions that were given to them at the PAT appointment. Patient was also instructed that they will need to review over the PAT instructions again at home before surgery.

## 2023-07-05 NOTE — Patient Instructions (Signed)
 SURGICAL WAITING ROOM VISITATION  Patients having surgery or a procedure may have no more than 2 support people in the waiting area - these visitors may rotate.    Children under the age of 56 must have an adult with them who is not the patient.  Due to an increase in RSV and influenza rates and associated hospitalizations, children ages 63 and under may not visit patients in [facility] hospitals.  Visitors with respiratory illnesses are discouraged from visiting and should remain at home.  If the patient needs to stay at the hospital during part of their recovery, the visitor guidelines for inpatient rooms apply. Pre-op nurse will coordinate an appropriate time for 1 support person to accompany patient in pre-op.  This support person may not rotate.    Please refer to the [facility] website for the visitor guidelines for Inpatients (after your surgery is over and you are in a regular room).     Your procedure is scheduled on: 07/16/23   Report to [facility] ([facility]) Main Entrance    Report to admitting at 5:15 AM   Call this number if you have problems the morning of surgery [phone]   Do not eat food :After Midnight.   After Midnight you may have the following liquids until 4:30 AM DAY OF SURGERY  Water  Non-Citrus Juices (without pulp, NO RED-Apple, White grape, White cranberry) Black Coffee (NO MILK/CREAM OR CREAMERS, sugar ok)  Clear Tea (NO MILK/CREAM OR CREAMERS, sugar ok) regular and decaf                             Plain Jell-O (NO RED)                                           Fruit ices (not with fruit pulp, NO RED)                                     Popsicles (NO RED)                                                               Sports drinks like Gatorade (NO RED)                 The day of surgery:  Drink ONE (1) Pre-Surgery G2 at 4:30 AM the morning of surgery. Drink in one sitting. Do not sip.  This drink was given to you during your hospital  pre-op  appointment visit. Nothing else to drink after completing the  Pre-Surgery G2.          If you have questions, please contact your surgeon's office.   FOLLOW BOWEL PREP AND ANY ADDITIONAL PRE OP INSTRUCTIONS YOU RECEIVED FROM YOUR SURGEON'S OFFICE!!!     Oral Hygiene is also important to reduce your risk of infection.                                    Remember - BRUSH  YOUR TEETH THE MORNING OF SURGERY WITH YOUR REGULAR TOOTHPASTE  DENTURES WILL BE REMOVED PRIOR TO SURGERY PLEASE DO NOT APPLY "Poly grip" OR ADHESIVES!!!   Do NOT smoke after Midnight   Stop all vitamins and herbal supplements 7 days before surgery.   Take these medicines the morning of surgery with A SIP OF WATER : Tylenol , Allopurinol , Amlodipine , Atorvastatin , Carvedilol , Gabapentin , Zofran   These are anesthesia recommendations for holding your anticoagulants.  Please contact your prescribing physician to confirm IF it is safe to hold your anticoagulants for this length of time.   Eliquis Apixaban   72 hours   Xarelto Rivaroxaban   72 hours  Plavix  Clopidogrel    120 hours  Pletal Cilostazol   120 hours    DO NOT TAKE ANY ORAL DIABETIC MEDICATIONS DAY OF YOUR SURGERY  How to Manage Your Diabetes Before and After Surgery  Why is it important to control my blood sugar before and after surgery? Improving blood sugar levels before and after surgery helps healing and can limit problems. A way of improving blood sugar control is eating a healthy diet by:  Eating less sugar and carbohydrates  Increasing activity/exercise  Talking with your doctor about reaching your blood sugar goals High blood sugars (greater than 180 mg/dL) can raise your risk of infections and slow your recovery, so you will need to focus on controlling your diabetes during the weeks before surgery. Make sure that the doctor who takes care of your diabetes knows about your planned surgery including the date and location.  How do I manage my  blood sugar before surgery? Check your blood sugar at least 4 times a day, starting 2 days before surgery, to make sure that the level is not too high or low. Check your blood sugar the morning of your surgery when you wake up and every 2 hours until you get to the Short Stay unit. If your blood sugar is less than 70 mg/dL, you will need to treat for low blood sugar: Do not take insulin . Treat a low blood sugar (less than 70 mg/dL) with  cup of clear juice (cranberry or apple), 4 glucose tablets, OR glucose gel. Recheck blood sugar in 15 minutes after treatment (to make sure it is greater than 70 mg/dL). If your blood sugar is not greater than 70 mg/dL on recheck, call [phone] for further instructions. Report your blood sugar to the short stay nurse when you get to Short Stay.  If you are admitted to the hospital after surgery: Your blood sugar will be checked by the staff and you will probably be given insulin  after surgery (instead of oral diabetes medicines) to make sure you have good blood sugar levels. The goal for blood sugar control after surgery is 80-180 mg/dL.   WHAT DO I DO ABOUT MY DIABETES MEDICATION?  Do not take oral diabetes medicines (pills) the morning of surgery.  THE DAY BEFORE SURGERY, take Metformin  as prescribed.       THE MORNING OF SURGERY, do not take Metformin    Reviewed and Endorsed by [facility] Patient Education Committee, August 2015                              You may not have any metal on your body including hair pins, jewelry, and body piercing             Do not wear make-up, lotions, powders, perfumes, or deodorant  Do not wear nail polish including gel and S&S, artificial/acrylic nails, or any other type of covering on natural nails including finger and toenails. If you have artificial nails, gel coating, etc. that needs to be removed by a nail salon please have this removed prior to surgery or surgery may need to be canceled/ delayed if the  surgeon/ anesthesia feels like they are unable to be safely monitored.   Do not shave  48 hours prior to surgery.    Do not bring valuables to the hospital. [facility] IS NOT             RESPONSIBLE   FOR VALUABLES.   Contacts, glasses, dentures or bridgework may not be worn into surgery.   Bring small overnight bag day of surgery.   DO NOT BRING YOUR HOME MEDICATIONS TO THE HOSPITAL. PHARMACY WILL DISPENSE MEDICATIONS LISTED ON YOUR MEDICATION LIST TO YOU DURING YOUR ADMISSION IN THE HOSPITAL!              Please read over the following fact sheets you were given: IF YOU HAVE QUESTIONS ABOUT YOUR PRE-OP INSTRUCTIONS PLEASE CALL [phone] Shaffer   If you received a COVID test during your pre-op visit  it is requested that you wear a mask when out in public, stay away from anyone that may not be feeling well and notify your surgeon if you develop symptoms. If you test positive for Covid or have been in contact with anyone that has tested positive in the last 10 days please notify you surgeon.      Pre-operative 5 CHG Bath Instructions   You can play a key role in reducing the risk of infection after surgery. Your skin needs to be as free of germs as possible. You can reduce the number of germs on your skin by washing with CHG (chlorhexidine  gluconate) soap before surgery. CHG is an antiseptic soap that kills germs and continues to kill germs even after washing.   DO NOT use if you have an allergy to chlorhexidine /CHG or antibacterial soaps. If your skin becomes reddened or irritated, stop using the CHG and notify one of our RNs at [phone].   Please shower with the CHG soap starting 4 days before surgery using the following schedule:     Please keep in mind the following:  DO NOT shave, including legs and underarms, starting the day of your first shower.   You may shave your face at any point before/day of surgery.  Place clean sheets on your bed the day you start using CHG  soap. Use a clean washcloth (not used since being washed) for each shower. DO NOT sleep with pets once you start using the CHG.   CHG Shower Instructions:  If you choose to wash your hair and private area, wash first with your normal shampoo/soap.  After you use shampoo/soap, rinse your hair and body thoroughly to remove shampoo/soap residue.  Turn the water  OFF and apply about 3 tablespoons (45 ml) of CHG soap to a CLEAN washcloth.  Apply CHG soap ONLY FROM YOUR NECK DOWN TO YOUR TOES (washing for 3-5 minutes)  DO NOT use CHG soap on face, private areas, open wounds, or sores.  Pay special attention to the area where your surgery is being performed.  If you are having back surgery, having someone wash your back for you may be helpful. Wait 2 minutes after CHG soap is applied, then you may rinse off the CHG soap.  Pat dry  with a clean towel  Put on clean clothes/pajamas   If you choose to wear lotion, please use ONLY the CHG-compatible lotions on the back of this paper.     Additional instructions for the day of surgery: DO NOT APPLY any lotions, deodorants, cologne, or perfumes.   Put on clean/comfortable clothes.  Brush your teeth.  Ask your nurse before applying any prescription medications to the skin.      CHG Compatible Lotions   Aveeno Moisturizing lotion  Cetaphil Moisturizing Cream  Cetaphil Moisturizing Lotion  Clairol Herbal Essence Moisturizing Lotion, Dry Skin  Clairol Herbal Essence Moisturizing Lotion, Extra Dry Skin  Clairol Herbal Essence Moisturizing Lotion, Normal Skin  Curel Age Defying Therapeutic Moisturizing Lotion with Alpha Hydroxy  Curel Extreme Care Body Lotion  Curel Soothing Hands Moisturizing Hand Lotion  Curel Therapeutic Moisturizing Cream, Fragrance-Free  Curel Therapeutic Moisturizing Lotion, Fragrance-Free  Curel Therapeutic Moisturizing Lotion, Original Formula  Eucerin Daily Replenishing Lotion  Eucerin Dry Skin Therapy Plus Alpha Hydroxy  Crme  Eucerin Dry Skin Therapy Plus Alpha Hydroxy Lotion  Eucerin Original Crme  Eucerin Original Lotion  Eucerin Plus Crme Eucerin Plus Lotion  Eucerin TriLipid Replenishing Lotion  Keri Anti-Bacterial Hand Lotion  Keri Deep Conditioning Original Lotion Dry Skin Formula Softly Scented  Keri Deep Conditioning Original Lotion, Fragrance Free Sensitive Skin Formula  Keri Lotion Fast Absorbing Fragrance Free Sensitive Skin Formula  Keri Lotion Fast Absorbing Softly Scented Dry Skin Formula  Keri Original Lotion  Keri Skin Renewal Lotion Keri Silky Smooth Lotion  Keri Silky Smooth Sensitive Skin Lotion  Nivea Body Creamy Conditioning Oil  Nivea Body Extra Enriched Lotion  Nivea Body Original Lotion  Nivea Body Sheer Moisturizing Lotion Nivea Crme  Nivea Skin Firming Lotion  NutraDerm 30 Skin Lotion  NutraDerm Skin Lotion  NutraDerm Therapeutic Skin Cream  NutraDerm Therapeutic Skin Lotion  ProShield Protective Hand Cream  Provon moisturizing lotion   Incentive Spirometer  An incentive spirometer is a tool that can help keep your lungs clear and active. This tool measures how well you are filling your lungs with each breath. Taking long deep breaths may help reverse or decrease the chance of developing breathing (pulmonary) problems (especially infection) following: A long period of time when you are unable to move or be active. BEFORE THE PROCEDURE  If the spirometer includes an indicator to show your best effort, your nurse or respiratory therapist will set it to a desired goal. If possible, sit up straight or lean slightly forward. Try not to slouch. Hold the incentive spirometer in an upright position. INSTRUCTIONS FOR USE  Sit on the edge of your bed if possible, or sit up as far as you can in bed or on a chair. Hold the incentive spirometer in an upright position. Breathe out normally. Place the mouthpiece in your mouth and seal your lips tightly around it. Breathe in  slowly and as deeply as possible, raising the piston or the ball toward the top of the column. Hold your breath for 3-5 seconds or for as long as possible. Allow the piston or ball to fall to the bottom of the column. Remove the mouthpiece from your mouth and breathe out normally. Rest for a few seconds and repeat Steps 1 through 7 at least 10 times every 1-2 hours when you are awake. Take your time and take a few normal breaths between deep breaths. The spirometer may include an indicator to show your best effort. Use the indicator as a goal  to work toward during each repetition. After each set of 10 deep breaths, practice coughing to be sure your lungs are clear. If you have an incision (the cut made at the time of surgery), support your incision when coughing by placing a pillow or rolled up towels firmly against it. Once you are able to get out of bed, walk around indoors and cough well. You may stop using the incentive spirometer when instructed by your caregiver.  RISKS AND COMPLICATIONS Take your time so you do not get dizzy or light-headed. If you are in pain, you may need to take or ask for pain medication before doing incentive spirometry. It is harder to take a deep breath if you are having pain. AFTER USE Rest and breathe slowly and easily. It can be helpful to keep track of a log of your progress. Your caregiver can provide you with a simple table to help with this. If you are using the spirometer at home, follow these instructions: SEEK MEDICAL CARE IF:  You are having difficultly using the spirometer. You have trouble using the spirometer as often as instructed. Your pain medication is not giving enough relief while using the spirometer. You develop fever of 100.5 F (38.1 C) or higher. SEEK IMMEDIATE MEDICAL CARE IF:  You cough up bloody sputum that had not been present before. You develop fever of 102 F (38.9 C) or greater. You develop worsening pain at or near the incision  site. MAKE SURE YOU:  Understand these instructions. Will watch your condition. Will get help right away if you are not doing well or get worse. Document Released: 06/25/2006 Document Revised: 05/07/2011 Document Reviewed: 08/26/2006 [employer] Patient Information 2014 [employer], [place].   ________________________________________________________________________ WHAT IS A BLOOD TRANSFUSION? Blood Transfusion Information  A transfusion is the replacement of blood or some of its parts. Blood is made up of multiple cells which provide different functions. Red blood cells carry oxygen and are used for blood loss replacement. White blood cells fight against infection. Platelets control bleeding. Plasma helps clot blood. Other blood products are available for specialized needs, such as hemophilia or other clotting disorders. BEFORE THE TRANSFUSION  Who gives blood for transfusions?  Healthy volunteers who are fully evaluated to make sure their blood is safe. This is blood bank blood. Transfusion therapy is the safest it has ever been in the practice of medicine. Before blood is taken from a donor, a complete history is taken to make sure that person has no history of diseases nor engages in risky social behavior (examples are intravenous drug use or sexual activity with multiple partners). The donor's travel history is screened to minimize risk of transmitting infections, such as malaria. The donated blood is tested for signs of infectious diseases, such as HIV and hepatitis. The blood is then tested to be sure it is compatible with you in order to minimize the chance of a transfusion reaction. If you or a relative donates blood, this is often done in anticipation of surgery and is not appropriate for emergency situations. It takes many days to process the donated blood. RISKS AND COMPLICATIONS Although transfusion therapy is very safe and saves many lives, the main dangers of transfusion include:   Getting an infectious disease. Developing a transfusion reaction. This is an allergic reaction to something in the blood you were given. Every precaution is taken to prevent this. The decision to have a blood transfusion has been considered carefully by your caregiver before blood is given. Blood  is not given unless the benefits outweigh the risks. AFTER THE TRANSFUSION Right after receiving a blood transfusion, you will usually feel much better and more energetic. This is especially true if your red blood cells have gotten low (anemic). The transfusion raises the level of the red blood cells which carry oxygen, and this usually causes an energy increase. The nurse administering the transfusion will monitor you carefully for complications. HOME CARE INSTRUCTIONS  No special instructions are needed after a transfusion. You may find your energy is better. Speak with your caregiver about any limitations on activity for underlying diseases you may have. SEEK MEDICAL CARE IF:  Your condition is not improving after your transfusion. You develop redness or irritation at the intravenous (IV) site. SEEK IMMEDIATE MEDICAL CARE IF:  Any of the following symptoms occur over the next 12 hours: Shaking chills. You have a temperature by mouth above 102 F (38.9 C), not controlled by medicine. Chest, back, or muscle pain. People around you feel you are not acting correctly or are confused. Shortness of breath or difficulty breathing. Dizziness and fainting. You get a rash or develop hives. You have a decrease in urine output. Your urine turns a dark color or changes to pink, red, or brown. Any of the following symptoms occur over the next 10 days: You have a temperature by mouth above 102 F (38.9 C), not controlled by medicine. Shortness of breath. Weakness after normal activity. The white part of the eye turns yellow (jaundice). You have a decrease in the amount of urine or are urinating less  often. Your urine turns a dark color or changes to pink, red, or brown. Document Released: 02/10/2000 Document Revised: 05/07/2011 Document Reviewed: 09/29/2007 [facility] Patient Information 2014 [place], [place].  _______________________________________________________________________

## 2023-07-08 ENCOUNTER — Other Ambulatory Visit: Payer: Self-pay

## 2023-07-08 ENCOUNTER — Encounter (HOSPITAL_COMMUNITY): Payer: Self-pay

## 2023-07-08 ENCOUNTER — Encounter (HOSPITAL_COMMUNITY)
Admission: RE | Admit: 2023-07-08 | Discharge: 2023-07-08 | Disposition: A | Discharge status: Home / Self Care | Source: Ambulatory Visit | Attending: Orthopedic Surgery | Admitting: Orthopedic Surgery

## 2023-07-08 VITALS — BP 109/62 | HR 83 | Temp 97.7°F | Resp 16 | Ht 63.0 in | Wt 128.0 lb

## 2023-07-08 DIAGNOSIS — Z87891 Personal history of nicotine dependence: Secondary | ICD-10-CM | POA: Diagnosis not present

## 2023-07-08 DIAGNOSIS — G4733 Obstructive sleep apnea (adult) (pediatric): Secondary | ICD-10-CM | POA: Insufficient documentation

## 2023-07-08 DIAGNOSIS — Z01812 Encounter for preprocedural laboratory examination: Secondary | ICD-10-CM | POA: Diagnosis present

## 2023-07-08 DIAGNOSIS — Z7984 Long term (current) use of oral hypoglycemic drugs: Secondary | ICD-10-CM | POA: Diagnosis not present

## 2023-07-08 DIAGNOSIS — Z4889 Encounter for other specified surgical aftercare: Secondary | ICD-10-CM

## 2023-07-08 DIAGNOSIS — E1151 Type 2 diabetes mellitus with diabetic peripheral angiopathy without gangrene: Secondary | ICD-10-CM | POA: Insufficient documentation

## 2023-07-08 DIAGNOSIS — E785 Hyperlipidemia, unspecified: Secondary | ICD-10-CM | POA: Insufficient documentation

## 2023-07-08 DIAGNOSIS — E1142 Type 2 diabetes mellitus with diabetic polyneuropathy: Secondary | ICD-10-CM | POA: Diagnosis not present

## 2023-07-08 DIAGNOSIS — I2510 Atherosclerotic heart disease of native coronary artery without angina pectoris: Secondary | ICD-10-CM | POA: Insufficient documentation

## 2023-07-08 DIAGNOSIS — M1612 Unilateral primary osteoarthritis, left hip: Secondary | ICD-10-CM | POA: Diagnosis not present

## 2023-07-08 DIAGNOSIS — Z01818 Encounter for other preprocedural examination: Secondary | ICD-10-CM

## 2023-07-08 DIAGNOSIS — I10 Essential (primary) hypertension: Secondary | ICD-10-CM | POA: Diagnosis not present

## 2023-07-08 LAB — BASIC METABOLIC PANEL WITH GFR
Anion gap: 13 (ref 5–15)
BUN: 16 mg/dL (ref 8–23)
CO2: 23 mmol/L (ref 22–32)
Calcium: 8.5 mg/dL — ABNORMAL LOW (ref 8.9–10.3)
Chloride: 100 mmol/L (ref 98–111)
Creatinine, Ser: 0.91 mg/dL (ref 0.44–1.00)
GFR, Estimated: >60 mL/min (ref >60)
Glucose, Bld: 106 mg/dL — ABNORMAL HIGH (ref 70–99)
Potassium: 3 mmol/L — ABNORMAL LOW (ref 3.5–5.1)
Sodium: 136 mmol/L (ref 135–145)

## 2023-07-08 LAB — GLUCOSE, CAPILLARY: Glucose-Capillary: 96 mg/dL (ref 70–99)

## 2023-07-08 LAB — SURGICAL PCR SCREEN
MRSA, PCR: NEGATIVE
Staphylococcus aureus: NEGATIVE

## 2023-07-09 LAB — HEMOGLOBIN A1C
Hgb A1c MFr Bld: 6.7 % — ABNORMAL HIGH (ref 4.8–5.6)
Mean Plasma Glucose: 146 mg/dL

## 2023-07-09 NOTE — Progress Notes (Signed)
 Case: [id number] Date/Time: 07/16/23 0715   Procedure: ARTHROPLASTY, HIP, TOTAL, ANTERIOR APPROACH (Left: Hip)   Anesthesia type: Spinal   Pre-op diagnosis: Osteoarthritis LEFT HIP   Location: [facility] ROOM 07 / [facility]   Surgeons: Saundra Curl, MD       DISCUSSION: April Shaffer is a 73 yo female who presents to PAT prior to surgery above. PMH of every day smoking, HTN, CAD (nonobstructive by cath), carotid artery disease s/p left TCAR (07/2022) and right CEA (09/2011 with subsequent CTO), PAD s/p right SFA stent (2023), OSA (no CPAP use), DM (A1c 6.7), s/p lumbar fusion  Patient follows with cardiology for history of nonobstructive CAD by cath 2013, HTN, HLD. Last seen on 07/13/2022 for preop evaluation prior to L TCAR. Doing well at that time and was cleared for surgery. Surgery was uncomplicated.  Pt follows with Vascular for hx of PVD s/p multiple interventions as listed above. Last seen on 05/15/23 by Dr. Vikki Graves and annual imaging completed. Advised to f/u in 1 year for repeat imaging.       History of OSA.  Not on CPAP s/p tonsillectomy and adenoidectomy.  LD Plavix : 5/14 at 8AM (patient reviewed <7 days prior to surgery)   VS: BP 109/62   Pulse 83   Temp 36.5 C (Oral)   Resp 16   Ht 5\' 3"  (1.6 m)   Wt 58.1 kg   SpO2 100%   BMI 22.67 kg/m   PROVIDERS: Shannan Dart., FNP   LABS: K mildly low - she takes hydrochlorothiazide . Advised pt to eat potassium rich food  (all labs ordered are listed, but only abnormal results are displayed)  Labs Reviewed  BASIC METABOLIC PANEL WITH GFR - Abnormal; Notable for the following components:      Result Value   Potassium 3.0 (*)    Glucose, Bld 106 (*)    Calcium  8.5 (*)    All other components within normal limits  HEMOGLOBIN A1C - Abnormal; Notable for the following components:   Hgb A1c MFr Bld 6.7 (*)    All other components within normal limits  SURGICAL PCR SCREEN  GLUCOSE, CAPILLARY  TYPE AND SCREEN      IMAGES:   EKG:   CV: Carotid US  05/15/23:  Summary: Right Carotid: Evidence consistent with a total occlusion of the right ICA.  Left Carotid: There is no evidence of stenosis in the left ICA. Widely patent stent.  Vertebrals:  Bilateral vertebral arteries demonstrate antegrade flow. Subclavians: Normal flow hemodynamics were seen in bilateral subclavian              arteries.    Echo 03/14/2023 ([facility] - scanned in media on 06/04/23)  Conclusions: Normal LV systolic function with visual EF 55-60% LV cavity is normal in size Mild concentric hypertrophy of the LV Indeterminate diastolic filling pattern Tri leaflet aortic valve with no regurgitation Mild aortic valve leaflet thickening No evidence of aortic stenosis Mild (Grade I) mitral regurgitation Mild mitral valve leaflet thickening No evidence of mitral stenosis Structurally normal tricuspid valve with mild regurgitation Mild pulmonary HTN   Nuclear stress 03/28/2018: Nuclear stress EF: 68%. The left ventricular ejection fraction is hyperdynamic (>65%). There was no ST segment deviation noted during stress. The study is normal. This is a low risk study.   Normal pharmacologic nuclear study with no evidence for a prior infarct or ischemia. Since the prior study in 2013 ischemia is no longer present.   LHC 01/16/2012 LM:  Normal in size. 10% distal stenosis LAD: Normal in size. 50% discrete ostial stenosis. In the proximal extending into the midsegment, there is a 50% tubular stenosis followed by a short aneurysmal segment. The rest of the vessel has minor irregularities. D1: 30% proximal stenosis. D2: 30% proximal stenosis. D3: Small with minor irregularities. LCX: Large in size and dominant. Minor irregularities. 30% proximal stenosis. OM1: Small in size with no significant disease. OM2: Small in size with no significant disease. OM3: Normal in size with diffuse 30% proximal  disease. Posterior AV groove: Minor irregularities and gives the PDA and 2 PLA branches. RCA: Small and nondominant. Minor irregularities. Final Conclusions:   1. No evidence of obstructive coronary artery disease. There is moderate LAD disease which is diffuse as well as mild disease in the left circumflex. 2. Normal LV systolic function. 3. Moderate systemic hypertension with mildly elevated LVEDP and mild gradient across the aortic valve.  Past Medical History:  Diagnosis Date   Anemia    Arthritis    Blood transfusion without reported diagnosis    Carotid artery narrowing    Coronary artery disease    Cardiac catheterization November 2013: 50% ostial LAD stenosis 50% mid stenosis. 30% disease in the left circumflex.   Diabetes mellitus, type 2 (HCC)    Hyperlipidemia    Hypertension    Onychomycosis of toenail 07/31/2016   PAD (peripheral artery disease) (HCC)    Sleep apnea       Had surgery to correct   Thyroid  nodule     Past Surgical History:  Procedure Laterality Date   ABDOMINAL AORTOGRAM W/LOWER EXTREMITY N/A 08/07/2021   Procedure: ABDOMINAL AORTOGRAM W/LOWER EXTREMITY;  Surgeon: Adine Hoof, MD;  Location: [facility] INVASIVE CV LAB;  Service: Cardiovascular;  Laterality: N/A;   ABDOMINAL AORTOGRAM W/LOWER EXTREMITY Right 10/23/2021   Procedure: ABDOMINAL AORTOGRAM W/LOWER EXTREMITY;  Surgeon: Adine Hoof, MD;  Location: [facility] INVASIVE CV LAB;  Service: Cardiovascular;  Laterality: Right;   ABDOMINAL HYSTERECTOMY     APPLICATION OF INTRAOPERATIVE CT SCAN N/A 11/30/2020   Procedure: APPLICATION OF INTRAOPERATIVE CT SCAN;  Surgeon: Isadora Mar, MD;  Location: [facility] OR;  Service: Neurosurgery;  Laterality: N/A;   BACK SURGERY     CARDIAC CATHETERIZATION     CATARACT EXTRACTION, BILATERAL Bilateral 2021   Dr. Brooks Cao   ENDARTERECTOMY  09/27/2011   Procedure: RIGT ENDARTERECTOMY CAROTID;  Surgeon: Palma Bob, MD;  Location: [facility] OR;  Service: Vascular;   Laterality: Right;   EPIDURAL BLOCK INJECTION  02/2008   Drs. Maxwell Spark   EYE SURGERY Bilateral 2021   cataract   gyn surgery  2004   total hysterectomy for mennorhagia,,salpingoophorectomy   INCISION AND DRAINAGE ABSCESS Left 05/08/2022   Procedure: ARTHROSCOPIC INCISION AND DRAINAGE ABSCESS, DISTAL CLAVICLE EXCISSION, SUBACROMIAL DECOMPRESSION, LOOSE BODY REMOVAL, EXTENSIVE DEBRIDEMENT;  Surgeon: Saundra Curl, MD;  Location[facility];  Service: Orthopedics;  Laterality: Left;   LAMINECTOMY WITH POSTERIOR LATERAL ARTHRODESIS LEVEL 2 N/A 11/30/2020   Procedure: LUMBAR FOUR-FIVE, LUMBAR FIVE-SACRAL ONE POSTERIOR LATERAL FUSION WITH REVISION OF LUMBAR ONE-FIVE HARDWARE AND EXTENSION TO SACRAL ONE AND SACRAL TWO;  Surgeon: Isadora Mar, MD;  Location: [facility] OR;  Service: Neurosurgery;  Laterality: N/A;   LUMBAR WOUND DEBRIDEMENT N/A 05/25/2020   Procedure: LUMBAR WOUND IRRIGATION AND DEBRIDEMENT;  Surgeon: Isadora Mar, MD;  Location: [facility] OR;  Service: Neurosurgery;  Laterality: N/A;   PERIPHERAL VASCULAR INTERVENTION  08/07/2021   Procedure: PERIPHERAL VASCULAR INTERVENTION;  Surgeon: Adine Hoof, MD;  Location: [facility] INVASIVE CV LAB;  Service: Cardiovascular;;  Rt SFA   PERIPHERAL VASCULAR INTERVENTION Right 10/23/2021   Procedure: PERIPHERAL VASCULAR INTERVENTION;  Surgeon: Adine Hoof, MD;  Location: [facility] INVASIVE CV LAB;  Service: Cardiovascular;  Laterality: Right;  SFA   POSTERIOR LUMBAR FUSION 4 LEVEL N/A 04/25/2020   Procedure: POSTERIOR LUMBAR INTERBODY FUSION LUMBAR ONE-TWO, LUMBAR TWO-THREE, LUMBAR THREE-FOUR,LUMBAR FOUR-FIVE.;  Surgeon: Isadora Mar, MD;  Location: [facility] OR;  Service: Neurosurgery;  Laterality: N/A;  posterior   SHOULDER ARTHROSCOPY Left 05/08/2022   Procedure: ARTHROSCOPY SHOULDER;  Surgeon: Saundra Curl, MD;  Location[facility];  Service: Orthopedics;  Laterality: Left;   TONSILLECTOMY     TOTAL ABDOMINAL HYSTERECTOMY W/  BILATERAL SALPINGOOPHORECTOMY Bilateral 2000   TOTAL HIP ARTHROPLASTY Right 08/22/2018   Procedure: TOTAL HIP ARTHROPLASTY;  Surgeon: Marlena Sima, MD;  Location[facility];  Service: Orthopedics;  Laterality: Right;   TRANSCAROTID ARTERY REVASCULARIZATION  Left 08/07/2022   Procedure: Transcarotid Artery Revascularization;  Surgeon: Adine Hoof, MD;  Location: [facility] OR;  Service: Vascular;  Laterality: Left;   ULTRASOUND GUIDANCE FOR VASCULAR ACCESS Left 08/07/2022   Procedure: ULTRASOUND GUIDANCE FOR VASCULAR ACCESS;  Surgeon: Adine Hoof, MD;  Location: [facility] OR;  Service: Vascular;  Laterality: Left;   VARICOSE VEIN SURGERY  2008   stripping    MEDICATIONS:  acetaminophen  (TYLENOL ) 500 MG tablet   allopurinol  (ZYLOPRIM ) 300 MG tablet   amLODipine  (NORVASC ) 10 MG tablet   aspirin  EC 81 MG tablet   atorvastatin  (LIPITOR ) 80 MG tablet   carvedilol  (COREG  CR) 10 MG 24 hr capsule   clopidogrel  (PLAVIX ) 75 MG tablet   cyanocobalamin  (VITAMIN B12) 1000 MCG/ML injection   ezetimibe  (ZETIA ) 10 MG tablet   ferrous sulfate  325 (65 FE) MG tablet   gabapentin  (NEURONTIN ) 300 MG capsule   glucose blood test strip   hydrochlorothiazide  (HYDRODIURIL ) 12.5 MG tablet   metFORMIN  (GLUCOPHAGE -XR) 750 MG 24 hr tablet   ondansetron  (ZOFRAN ) 4 MG tablet   oxyCODONE -acetaminophen  (PERCOCET) 10-325 MG tablet   No current facility-administered medications for this encounter.   Antoinette Kirschner [facility] Short Stay/Anesthesiology [facility] Phone [phone] 07/09/2023 2:14 PM

## 2023-07-09 NOTE — Anesthesia Preprocedure Evaluation (Addendum)
 Anesthesia Evaluation  Patient identified by MRN, date of birth, ID band Patient awake    Reviewed: Allergy & Precautions, H&P , NPO status , Patient's Chart, lab work & pertinent test results  Airway Mallampati: II  TM Distance: >3 FB Neck ROM: Full    Dental no notable dental hx. (+) Teeth Intact, Dental Advisory Given   Pulmonary Current Smoker and Patient abstained from smoking.   Pulmonary exam normal breath sounds clear to auscultation       Cardiovascular Exercise Tolerance: Good hypertension, Pt. on medications and Pt. on home beta blockers + CAD and + Peripheral Vascular Disease   Rhythm:Regular Rate:Normal     Neuro/Psych negative neurological ROS  negative psych ROS   GI/Hepatic negative GI ROS, Neg liver ROS,,,  Endo/Other  diabetes, Type 2, Oral Hypoglycemic Agents    Renal/GU negative Renal ROS  negative genitourinary   Musculoskeletal  (+) Arthritis , Osteoarthritis,    Abdominal   Peds  Hematology  (+) Blood dyscrasia, anemia   Anesthesia Other Findings   Reproductive/Obstetrics negative OB ROS                             Anesthesia Physical Anesthesia Plan  ASA: 3  Anesthesia Plan: General   Post-op Pain Management: Tylenol  PO (pre-op)*   Induction: Intravenous  PONV Risk Score and Plan: 2 and Ondansetron , Dexamethasone  and Propofol  infusion  Airway Management Planned: Oral ETT  Additional Equipment:   Intra-op Plan:   Post-operative Plan: Extubation in OR  Informed Consent: I have reviewed the patients History and Physical, chart, labs and discussed the procedure including the risks, benefits and alternatives for the proposed anesthesia with the patient or authorized representative who has indicated his/her understanding and acceptance.     Dental advisory given  Plan Discussed with: CRNA  Anesthesia Plan Comments: (See PAT note from 5/12)         Anesthesia Quick Evaluation

## 2023-07-09 NOTE — H&P (Signed)
 HIP ARTHROPLASTY ADMISSION H&P  Patient ID: April Shaffer MRN: [id number] DOB/AGE: Jul 05, 1950 73 y.o.  Chief Complaint: left hip pain.  Planned Procedure Date: 07/16/23 Medical Clearance by Gaetana Jones NP   Cardiac Clearance by Dr. Katheryne Pane Hematology clearance by Dr. Maryalice Smaller Vascular clearance by Dr. Vikki Graves Pain management clearance by Paulette Borrow PA-C   HPI: April Shaffer is a 73 y.o. female who presents for evaluation of Osteoarthritis LEFT HIP. The patient has a history of pain and functional disability in the left hip due to arthritis and has failed non-surgical conservative treatments for greater than 12 weeks to include NSAID's and/or analgesics, corticosteriod injections, use of assistive devices, and activity modification.  Onset of symptoms was abrupt, starting 3 months ago with rapidlly worsening course since that time. The patient noted no past surgery on the left hip.  Patient currently rates pain at 10 out of 10 with activity. Patient has night pain, worsening of pain with activity and weight bearing, and pain that interferes with activities of daily living.  Patient has evidence of subchondral sclerosis, periarticular osteophytes, and joint space narrowing by imaging studies.  There is no active infection.  Past Medical History:  Diagnosis Date   Anemia    Arthritis    Blood transfusion without reported diagnosis    Carotid artery narrowing    Coronary artery disease    Cardiac catheterization November 2013: 50% ostial LAD stenosis 50% mid stenosis. 30% disease in the left circumflex.   Diabetes mellitus, type 2 (HCC)    Hyperlipidemia    Hypertension    Onychomycosis of toenail 07/31/2016   PAD (peripheral artery disease) (HCC)    Sleep apnea       Had surgery to correct   Thyroid  nodule    Past Surgical History:  Procedure Laterality Date   ABDOMINAL AORTOGRAM W/LOWER EXTREMITY N/A 08/07/2021   Procedure: ABDOMINAL AORTOGRAM W/LOWER EXTREMITY;  Surgeon: Adine Hoof, MD;  Location: [facility] INVASIVE CV LAB;  Service: Cardiovascular;  Laterality: N/A;   ABDOMINAL AORTOGRAM W/LOWER EXTREMITY Right 10/23/2021   Procedure: ABDOMINAL AORTOGRAM W/LOWER EXTREMITY;  Surgeon: Adine Hoof, MD;  Location: [facility] INVASIVE CV LAB;  Service: Cardiovascular;  Laterality: Right;   ABDOMINAL HYSTERECTOMY     APPLICATION OF INTRAOPERATIVE CT SCAN N/A 11/30/2020   Procedure: APPLICATION OF INTRAOPERATIVE CT SCAN;  Surgeon: Isadora Mar, MD;  Location: [facility] OR;  Service: Neurosurgery;  Laterality: N/A;   BACK SURGERY     CARDIAC CATHETERIZATION     CATARACT EXTRACTION, BILATERAL Bilateral 2021   Dr. Brooks Cao   ENDARTERECTOMY  09/27/2011   Procedure: RIGT ENDARTERECTOMY CAROTID;  Surgeon: Palma Bob, MD;  Location: [facility] OR;  Service: Vascular;  Laterality: Right;   EPIDURAL BLOCK INJECTION  02/2008   Drs. Maxwell Spark   EYE SURGERY Bilateral 2021   cataract   gyn surgery  2004   total hysterectomy for mennorhagia,,salpingoophorectomy   INCISION AND DRAINAGE ABSCESS Left 05/08/2022   Procedure: ARTHROSCOPIC INCISION AND DRAINAGE ABSCESS, DISTAL CLAVICLE EXCISSION, SUBACROMIAL DECOMPRESSION, LOOSE BODY REMOVAL, EXTENSIVE DEBRIDEMENT;  Surgeon: Saundra Curl, MD;  Location: [facility] ORS;  Service: Orthopedics;  Laterality: Left;   LAMINECTOMY WITH POSTERIOR LATERAL ARTHRODESIS LEVEL 2 N/A 11/30/2020   Procedure: LUMBAR FOUR-FIVE, LUMBAR FIVE-SACRAL ONE POSTERIOR LATERAL FUSION WITH REVISION OF LUMBAR ONE-FIVE HARDWARE AND EXTENSION TO SACRAL ONE AND SACRAL TWO;  Surgeon: Isadora Mar, MD;  Location: [facility] OR;  Service: Neurosurgery;  Laterality: N/A;   LUMBAR WOUND DEBRIDEMENT  N/A 05/25/2020   Procedure: LUMBAR WOUND IRRIGATION AND DEBRIDEMENT;  Surgeon: Isadora Mar, MD;  Location: [facility] OR;  Service: Neurosurgery;  Laterality: N/A;   PERIPHERAL VASCULAR INTERVENTION  08/07/2021   Procedure: PERIPHERAL VASCULAR INTERVENTION;  Surgeon: Adine Hoof, MD;  Location: [facility] INVASIVE CV LAB;  Service: Cardiovascular;;  Rt SFA   PERIPHERAL VASCULAR INTERVENTION Right 10/23/2021   Procedure: PERIPHERAL VASCULAR INTERVENTION;  Surgeon: Adine Hoof, MD;  Location: [facility] INVASIVE CV LAB;  Service: Cardiovascular;  Laterality: Right;  SFA   POSTERIOR LUMBAR FUSION 4 LEVEL N/A 04/25/2020   Procedure: POSTERIOR LUMBAR INTERBODY FUSION LUMBAR ONE-TWO, LUMBAR TWO-THREE, LUMBAR THREE-FOUR,LUMBAR FOUR-FIVE.;  Surgeon: Isadora Mar, MD;  Location: [facility] OR;  Service: Neurosurgery;  Laterality: N/A;  posterior   SHOULDER ARTHROSCOPY Left 05/08/2022   Procedure: ARTHROSCOPY SHOULDER;  Surgeon: Saundra Curl, MD;  Location: [facility] ORS;  Service: Orthopedics;  Laterality: Left;   TONSILLECTOMY     TOTAL ABDOMINAL HYSTERECTOMY W/ BILATERAL SALPINGOOPHORECTOMY Bilateral 2000   TOTAL HIP ARTHROPLASTY Right 08/22/2018   Procedure: TOTAL HIP ARTHROPLASTY;  Surgeon: Marlena Sima, MD;  Location: [facility] ORS;  Service: Orthopedics;  Laterality: Right;   TRANSCAROTID ARTERY REVASCULARIZATION  Left 08/07/2022   Procedure: Transcarotid Artery Revascularization;  Surgeon: Adine Hoof, MD;  Location: [facility] OR;  Service: Vascular;  Laterality: Left;   ULTRASOUND GUIDANCE FOR VASCULAR ACCESS Left 08/07/2022   Procedure: ULTRASOUND GUIDANCE FOR VASCULAR ACCESS;  Surgeon: Adine Hoof, MD;  Location: [facility] OR;  Service: Vascular;  Laterality: Left;   VARICOSE VEIN SURGERY  2008   stripping   Allergies  Allergen Reactions   Zestoretic  [Lisinopril -Hydrochlorothiazide ] Swelling    Angioedema  - Tongue swelling    Codeine Itching   Oxycodone  Itching    Patient is taking at this time   Ultram  [Tramadol ] Itching   Zocor  [Simvastatin ] Other (See Comments)    Muscle pain   Prior to Admission medications   Medication Sig Start Date End Date Taking? Authorizing Provider  acetaminophen  (TYLENOL ) 500 MG tablet Take 500 mg by mouth every 6  (six) hours as needed for moderate pain.   Yes [provider]  allopurinol  (ZYLOPRIM ) 300 MG tablet Take 1 tablet (300 mg total) by mouth daily. 07/09/19  Yes Ronalee Cocking, MD  amLODipine  (NORVASC ) 10 MG tablet Take 10 mg by mouth in the morning. 06/18/23  Yes [provider]  aspirin  EC 81 MG tablet Take 81 mg by mouth in the morning.   Yes [provider]  atorvastatin  (LIPITOR ) 80 MG tablet Take 80 mg by mouth in the morning.   Yes [provider]  carvedilol  (COREG  CR) 10 MG 24 hr capsule Take 10 mg by mouth in the morning. 05/11/23  Yes [provider]  clopidogrel  (PLAVIX ) 75 MG tablet Take 75 mg by mouth in the morning.   Yes [provider]  cyanocobalamin  (VITAMIN B12) 1000 MCG/ML injection Inject 1,000 mcg into the muscle every 30 (thirty) days.   Yes [provider]  ezetimibe  (ZETIA ) 10 MG tablet TAKE 1 TABLET EVERY DAY (NEED MD APPOINTMENT) Patient taking differently: Take 10 mg by mouth daily. 09/27/20  Yes Avanell Leigh, MD  ferrous sulfate  325 (65 FE) MG tablet Take 325 mg by mouth in the morning.   Yes [provider]  gabapentin  (NEURONTIN ) 300 MG capsule Take 300 mg by mouth 3 (three) times daily. 06/09/21  Yes [provider]  hydrochlorothiazide  (HYDRODIURIL ) 12.5 MG tablet Take  12.5 mg by mouth in the morning.   Yes [provider]  metFORMIN  (GLUCOPHAGE -XR) 750 MG 24 hr tablet Take 750 mg by mouth in the morning. 06/07/23  Yes [provider]  ondansetron  (ZOFRAN ) 4 MG tablet Take 4 mg by mouth every 8 (eight) hours as needed for nausea or vomiting. 05/20/23  Yes [provider]  oxyCODONE -acetaminophen  (PERCOCET) 10-325 MG tablet Take 1 tablet by mouth every 4 (four) hours as needed for pain. Patient taking differently: Take 1 tablet by mouth every 6 (six) hours as needed for pain. 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    Comment: [employer]-retired 07/23/2014  Tobacco Use   Smoking status: Every Day    Current packs/day: 0.25    Average packs/day: 0.3 packs/day for 55.1 years (13.8 ttl pk-yrs)    Types: Cigarettes    Start date: 05/31/1968    Passive exposure: Never   Smokeless tobacco: Never   Tobacco comments:    Smoked x50 years, 1 pack would last 3 days. Currently smoking 4-5 cigarettes per day  Vaping Use   Vaping status: Never Used  Substance and Sexual Activity   Alcohol use: Not Currently    Alcohol/week: 14.0 standard drinks of alcohol    Types: 14 Standard drinks or equivalent per week    Comment: 1-2 drinks every other day   Drug use: No   Sexual activity: Not on file  Other Topics Concern   Not on file  Social History Narrative   Lives with long term boyfriend (30 years together)   Both sons live in [place]   Social Drivers of Health   Financial Resource Strain: Low Risk  (08/06/2018)   Overall Financial Resource Strain (CARDIA)    Difficulty of Paying Living Expenses: Not hard at all  Food Insecurity: No Food Insecurity (05/08/2022)   Hunger Vital Sign    Worried About Running Out of Food in the Last Year: Never true    Ran Out of Food in the Last Year: Never true  Transportation Needs: No Transportation Needs (05/08/2022)   PRAPARE - Administrator, Civil Service (Medical): No    Lack of Transportation (Non-Medical): No  Physical Activity: Inactive (06/17/2017)   Exercise Vital Sign    Days of Exercise per Week: 0 days    Minutes of Exercise per Session: 0 min  Stress: No Stress Concern Present (06/17/2017)   [employer] - Occupational Stress Questionnaire    Feeling of Stress : Not at all  Social Connections: Unknown (08/06/2018)   Social Connection and Isolation Panel [NHANES]    Frequency of Communication with Friends and Family: Not on file    Frequency of Social Gatherings with Friends and Family: Not on file    Attends Religious Services: Not on file    Active Member of Clubs or Organizations: Not on file    Attends Banker Meetings: Not on file    Marital Status: Living with partner   Family History  Problem Relation Age of Onset  Hypertension Sister    Hypothyroidism Sister    Cancer Maternal Grandmother        lung   Hypertension Son    Hypertension Sister    Diabetes Brother        lost toe in 2018 with new diagnosis of DM   Hypertension Son     ROS: Currently denies lightheadedness, dizziness, Fever, chills, CP, SOB.   No personal history of DVT, PE, MI, or CVA. No loose teeth Partial dentures are present All other systems have been reviewed and were otherwise currently negative with the exception of those mentioned in the HPI and as above.  Objective: Vitals: Ht: 5'3" Wt: 125 lbs Temp: 97.7 BP: 146/84 Pulse: 85 O2 97% on room air.   Physical Exam: General: Alert, NAD. Using wheelchair HEENT: EOMI, Good Neck Extension  Pulm: No increased work of breathing.  Clear B/L A/P w/o crackle or wheeze.  CV: RRR, No g/r appreciated. Slight holosystolic murmur GI: soft, NT, ND. BS x 4 quadrants Neuro: CN II-XII grossly intact without focal deficit.  Sensation intact distally Skin: No lesions in the area of chief complaint MSK/Surgical Site: + TTP. Hip ROM decreased  d/t pain. + Stinchfield. + SLR. + FABER/FADIR. 4/5 strength.  NVI.    Imaging Review Plain radiographs demonstrate severe degenerative joint disease of the left hip.   The bone quality appears to be fair for age and reported activity level.  Preoperative templating of the joint  replacement has been completed, documented, and submitted to the Operating Room personnel in order to optimize intra-operative equipment management.  Assessment: Osteoarthritis LEFT HIP Active Problems:   * No active hospital problems. *   Plan: Plan for Procedure(s): ARTHROPLASTY, HIP, TOTAL, ANTERIOR APPROACH  The patient history, physical exam, clinical judgement of the provider and imaging are consistent with end stage degenerative joint disease and total joint arthroplasty is deemed medically necessary. The treatment options including medical management, injection therapy, and arthroplasty were discussed at length. The risks and benefits of Procedure(s): ARTHROPLASTY, HIP, TOTAL, ANTERIOR APPROACH were presented and reviewed.  The risks of nonoperative treatment, versus surgical intervention including but not limited to continued pain, aseptic loosening, stiffness, dislocation/subluxation, infection, bleeding, nerve injury, blood clots, cardiopulmonary complications, morbidity, mortality, among others were discussed. The patient verbalizes understanding and wishes to proceed with the plan.  Patient is being admitted for surgery, pain control, PT, prophylactic antibiotics, VTE prophylaxis, progressive ambulation, ADL's and discharge planning. She will spend the night in observation.  Dental prophylaxis discussed and recommended for 2 years postoperatively.  The patient does meet the criteria for TXA which will be used perioperatively.   ASA 81 mg BID will be used postoperatively for DVT prophylaxis in addition to SCDs, and early ambulation. Plan for Oxycodone  and Tylenol  for pain. On Plavix  so can't take NSAIDs. Robaxin  for muscle spasm.  Zofran  for nausea and vomiting. Senokot is for constipation prevention. Pharmacy- [facility] on [address] The patient is planning to be discharged home with OPPT and into the care of her niece Guillermina Leer who can be reached at [phone] Follow up  appt 07/31/23 at 4:15pm     Ander Bame Office [phone] 07/09/2023 12:13 PM

## 2023-07-11 ENCOUNTER — Telehealth: Payer: Self-pay | Admitting: Hematology

## 2023-07-15 ENCOUNTER — Inpatient Hospital Stay

## 2023-07-16 ENCOUNTER — Encounter (HOSPITAL_COMMUNITY)
Admission: RE | Disposition: A | Discharge status: Home / Self Care | Payer: Self-pay | Source: Ambulatory Visit | Attending: Orthopedic Surgery

## 2023-07-16 ENCOUNTER — Ambulatory Visit (HOSPITAL_COMMUNITY)

## 2023-07-16 ENCOUNTER — Other Ambulatory Visit: Payer: Self-pay

## 2023-07-16 ENCOUNTER — Ambulatory Visit (HOSPITAL_COMMUNITY): Payer: Self-pay | Admitting: Medical

## 2023-07-16 ENCOUNTER — Observation Stay (HOSPITAL_COMMUNITY)
Admission: RE | Admit: 2023-07-16 | Discharge: 2023-07-17 | Disposition: A | Discharge status: Home / Self Care | Source: Ambulatory Visit | Attending: Orthopedic Surgery | Admitting: Orthopedic Surgery

## 2023-07-16 ENCOUNTER — Ambulatory Visit (HOSPITAL_BASED_OUTPATIENT_CLINIC_OR_DEPARTMENT_OTHER): Payer: Self-pay | Admitting: Anesthesiology

## 2023-07-16 ENCOUNTER — Encounter (HOSPITAL_COMMUNITY): Payer: Self-pay | Admitting: Orthopedic Surgery

## 2023-07-16 DIAGNOSIS — Z79899 Other long term (current) drug therapy: Secondary | ICD-10-CM | POA: Diagnosis not present

## 2023-07-16 DIAGNOSIS — M1612 Unilateral primary osteoarthritis, left hip: Secondary | ICD-10-CM | POA: Diagnosis present

## 2023-07-16 DIAGNOSIS — E119 Type 2 diabetes mellitus without complications: Secondary | ICD-10-CM | POA: Insufficient documentation

## 2023-07-16 DIAGNOSIS — F17210 Nicotine dependence, cigarettes, uncomplicated: Secondary | ICD-10-CM | POA: Insufficient documentation

## 2023-07-16 DIAGNOSIS — E1142 Type 2 diabetes mellitus with diabetic polyneuropathy: Secondary | ICD-10-CM

## 2023-07-16 DIAGNOSIS — Z96641 Presence of right artificial hip joint: Secondary | ICD-10-CM | POA: Diagnosis not present

## 2023-07-16 DIAGNOSIS — Z7982 Long term (current) use of aspirin: Secondary | ICD-10-CM | POA: Diagnosis not present

## 2023-07-16 DIAGNOSIS — Z96642 Presence of left artificial hip joint: Secondary | ICD-10-CM | POA: Diagnosis present

## 2023-07-16 DIAGNOSIS — I10 Essential (primary) hypertension: Secondary | ICD-10-CM

## 2023-07-16 DIAGNOSIS — Z7902 Long term (current) use of antithrombotics/antiplatelets: Secondary | ICD-10-CM | POA: Insufficient documentation

## 2023-07-16 DIAGNOSIS — I2510 Atherosclerotic heart disease of native coronary artery without angina pectoris: Secondary | ICD-10-CM | POA: Diagnosis not present

## 2023-07-16 LAB — GLUCOSE, CAPILLARY
Glucose-Capillary: 90 mg/dL (ref 70–99)
Glucose-Capillary: 97 mg/dL (ref 70–99)

## 2023-07-16 LAB — TYPE AND SCREEN
ABO/RH(D): O POS
Antibody Screen: NEGATIVE

## 2023-07-16 SURGERY — ARTHROPLASTY, HIP, TOTAL, ANTERIOR APPROACH
Anesthesia: General | Site: Hip | Laterality: Left

## 2023-07-16 MED ORDER — CHLORHEXIDINE GLUCONATE 0.12 % MT SOLN
15.0000 mL | Freq: Once | OROMUCOSAL | Status: AC
  Administered 2023-07-16: 15 mL via OROMUCOSAL

## 2023-07-16 MED ORDER — METOCLOPRAMIDE HCL 5 MG/ML IJ SOLN: 5.0000 mg | Freq: Three times a day (TID) | INTRAMUSCULAR | Status: DC | PRN

## 2023-07-16 MED ORDER — LACTATED RINGERS IV SOLN: INTRAVENOUS | Status: DC

## 2023-07-16 MED ORDER — ALUM & MAG HYDROXIDE-SIMETH 200-200-20 MG/5ML PO SUSP: 30.0000 mL | ORAL | Status: DC | PRN

## 2023-07-16 MED ORDER — TRANEXAMIC ACID-NACL 1000-0.7 MG/100ML-% IV SOLN
1000.0000 mg | Freq: Once | INTRAVENOUS | Status: AC
  Administered 2023-07-16: 1000 mg via INTRAVENOUS
  Filled 2023-07-16: qty 100

## 2023-07-16 MED ORDER — ACETAMINOPHEN 325 MG PO TABS: 325.0000 mg | ORAL_TABLET | Freq: Four times a day (QID) | ORAL | Status: DC | PRN

## 2023-07-16 MED ORDER — METOPROLOL TARTRATE 25 MG PO TABS
25.0000 mg | ORAL_TABLET | Freq: Every day | ORAL | Status: DC
  Filled 2023-07-16: qty 1

## 2023-07-16 MED ORDER — ORAL CARE MOUTH RINSE: 15.0000 mL | OROMUCOSAL | Status: DC | PRN

## 2023-07-16 MED ORDER — HYDROMORPHONE HCL 1 MG/ML IJ SOLN
0.5000 mg | INTRAMUSCULAR | Status: DC | PRN
Start: 2023-07-16 — End: 2023-07-17

## 2023-07-16 MED ORDER — ACETAMINOPHEN 500 MG PO TABS
1000.0000 mg | ORAL_TABLET | Freq: Once | ORAL | Status: DC
  Filled 2023-07-16: qty 2

## 2023-07-16 MED ORDER — DOCUSATE SODIUM 100 MG PO CAPS
100.0000 mg | ORAL_CAPSULE | Freq: Two times a day (BID) | ORAL | Status: DC
  Administered 2023-07-16 – 2023-07-17 (×3): 100 mg via ORAL
  Filled 2023-07-16 (×3): qty 1

## 2023-07-16 MED ORDER — LACTATED RINGERS IV SOLN: INTRAVENOUS | Status: DC | PRN

## 2023-07-16 MED ORDER — CEFAZOLIN SODIUM-DEXTROSE 2-4 GM/100ML-% IV SOLN
2.0000 g | INTRAVENOUS | Status: AC
  Administered 2023-07-16: 2 g via INTRAVENOUS
  Filled 2023-07-16: qty 100

## 2023-07-16 MED ORDER — ROCURONIUM BROMIDE 10 MG/ML (PF) SYRINGE
PREFILLED_SYRINGE | INTRAVENOUS | Status: AC
Start: 2023-07-16 — End: ?
  Filled 2023-07-16: qty 10

## 2023-07-16 MED ORDER — ONDANSETRON HCL 4 MG/2ML IJ SOLN: 4.0000 mg | Freq: Four times a day (QID) | INTRAMUSCULAR | Status: DC | PRN

## 2023-07-16 MED ORDER — POVIDONE-IODINE 10 % EX SWAB: 2.0000 | Freq: Once | CUTANEOUS | Status: DC

## 2023-07-16 MED ORDER — 0.9 % SODIUM CHLORIDE (POUR BTL) OPTIME
TOPICAL | Status: DC | PRN
  Administered 2023-07-16: 1000 mL

## 2023-07-16 MED ORDER — DEXAMETHASONE SODIUM PHOSPHATE 10 MG/ML IJ SOLN
INTRAMUSCULAR | Status: AC
  Filled 2023-07-16: qty 1

## 2023-07-16 MED ORDER — HYDROCHLOROTHIAZIDE 12.5 MG PO TABS
12.5000 mg | ORAL_TABLET | Freq: Every morning | ORAL | Status: DC
  Administered 2023-07-16 – 2023-07-17 (×2): 12.5 mg via ORAL
  Filled 2023-07-16 (×2): qty 1

## 2023-07-16 MED ORDER — CLOPIDOGREL BISULFATE 75 MG PO TABS
75.0000 mg | ORAL_TABLET | Freq: Every morning | ORAL | Status: DC
  Administered 2023-07-17: 75 mg via ORAL
  Filled 2023-07-16: qty 1

## 2023-07-16 MED ORDER — METHOCARBAMOL 500 MG PO TABS
500.0000 mg | ORAL_TABLET | Freq: Four times a day (QID) | ORAL | Status: DC | PRN
  Administered 2023-07-16 – 2023-07-17 (×3): 500 mg via ORAL
  Filled 2023-07-16 (×2): qty 1

## 2023-07-16 MED ORDER — FENTANYL CITRATE PF 50 MCG/ML IJ SOSY
PREFILLED_SYRINGE | INTRAMUSCULAR | Status: AC
  Filled 2023-07-16: qty 2

## 2023-07-16 MED ORDER — EZETIMIBE 10 MG PO TABS
10.0000 mg | ORAL_TABLET | Freq: Every day | ORAL | Status: DC
  Administered 2023-07-17: 10 mg via ORAL
  Filled 2023-07-16: qty 1

## 2023-07-16 MED ORDER — DIPHENHYDRAMINE HCL 12.5 MG/5ML PO ELIX: 12.5000 mg | ORAL_SOLUTION | ORAL | Status: DC | PRN

## 2023-07-16 MED ORDER — ACETAMINOPHEN 500 MG PO TABS: 1000.0000 mg | ORAL_TABLET | Freq: Once | ORAL | Status: DC

## 2023-07-16 MED ORDER — PROPOFOL 10 MG/ML IV BOLUS
INTRAVENOUS | Status: AC
  Filled 2023-07-16: qty 20

## 2023-07-16 MED ORDER — LIDOCAINE HCL (PF) 2 % IJ SOLN
INTRAMUSCULAR | Status: AC
  Filled 2023-07-16: qty 5

## 2023-07-16 MED ORDER — LIDOCAINE HCL (CARDIAC) PF 100 MG/5ML IV SOSY
PREFILLED_SYRINGE | INTRAVENOUS | Status: DC | PRN
  Administered 2023-07-16: 60 mg via INTRAVENOUS

## 2023-07-16 MED ORDER — PHENOL 1.4 % MT LIQD: 1.0000 | OROMUCOSAL | Status: DC | PRN

## 2023-07-16 MED ORDER — BUPIVACAINE LIPOSOME 1.3 % IJ SUSP: 10.0000 mL | Freq: Once | INTRAMUSCULAR | Status: DC

## 2023-07-16 MED ORDER — TRANEXAMIC ACID-NACL 1000-0.7 MG/100ML-% IV SOLN
1000.0000 mg | INTRAVENOUS | Status: AC
  Administered 2023-07-16: 1000 mg via INTRAVENOUS
  Filled 2023-07-16: qty 100

## 2023-07-16 MED ORDER — FENTANYL CITRATE PF 50 MCG/ML IJ SOSY
25.0000 ug | PREFILLED_SYRINGE | INTRAMUSCULAR | Status: DC | PRN
  Administered 2023-07-16 (×3): 50 ug via INTRAVENOUS

## 2023-07-16 MED ORDER — OXYCODONE HCL 5 MG PO TABS
10.0000 mg | ORAL_TABLET | ORAL | Status: DC | PRN
  Administered 2023-07-16 – 2023-07-17 (×3): 10 mg via ORAL
  Filled 2023-07-16 (×3): qty 2

## 2023-07-16 MED ORDER — ATORVASTATIN CALCIUM 40 MG PO TABS
80.0000 mg | ORAL_TABLET | Freq: Every morning | ORAL | Status: DC
  Administered 2023-07-17: 80 mg via ORAL
  Filled 2023-07-16: qty 2

## 2023-07-16 MED ORDER — GABAPENTIN 300 MG PO CAPS
300.0000 mg | ORAL_CAPSULE | Freq: Three times a day (TID) | ORAL | Status: DC
  Administered 2023-07-16 – 2023-07-17 (×5): 300 mg via ORAL
  Filled 2023-07-16 (×5): qty 1

## 2023-07-16 MED ORDER — CEFAZOLIN SODIUM-DEXTROSE 2-4 GM/100ML-% IV SOLN
2.0000 g | Freq: Four times a day (QID) | INTRAVENOUS | Status: AC
  Administered 2023-07-16 (×2): 2 g via INTRAVENOUS
  Filled 2023-07-16 (×2): qty 100

## 2023-07-16 MED ORDER — DEXAMETHASONE SODIUM PHOSPHATE 10 MG/ML IJ SOLN
10.0000 mg | Freq: Once | INTRAMUSCULAR | Status: AC
  Administered 2023-07-17: 10 mg via INTRAVENOUS
  Filled 2023-07-16: qty 1

## 2023-07-16 MED ORDER — MENTHOL 3 MG MT LOZG: 1.0000 | LOZENGE | OROMUCOSAL | Status: DC | PRN

## 2023-07-16 MED ORDER — PROPOFOL 1000 MG/100ML IV EMUL
INTRAVENOUS | Status: AC
Start: 2023-07-16 — End: ?
  Filled 2023-07-16: qty 100

## 2023-07-16 MED ORDER — FENTANYL CITRATE PF 50 MCG/ML IJ SOSY
PREFILLED_SYRINGE | INTRAMUSCULAR | Status: AC
  Filled 2023-07-16: qty 1

## 2023-07-16 MED ORDER — FENTANYL CITRATE (PF) 100 MCG/2ML IJ SOLN
INTRAMUSCULAR | Status: AC
  Filled 2023-07-16: qty 2

## 2023-07-16 MED ORDER — OXYCODONE HCL 5 MG PO TABS
5.0000 mg | ORAL_TABLET | ORAL | Status: DC | PRN
  Administered 2023-07-16: 5 mg via ORAL

## 2023-07-16 MED ORDER — CARVEDILOL PHOSPHATE ER 10 MG PO CP24
10.0000 mg | ORAL_CAPSULE | Freq: Every morning | ORAL | Status: DC
  Administered 2023-07-16 – 2023-07-17 (×2): 10 mg via ORAL
  Filled 2023-07-16 (×2): qty 1

## 2023-07-16 MED ORDER — METOCLOPRAMIDE HCL 5 MG PO TABS: 5.0000 mg | ORAL_TABLET | Freq: Three times a day (TID) | ORAL | Status: DC | PRN

## 2023-07-16 MED ORDER — ASPIRIN 81 MG PO CHEW
81.0000 mg | CHEWABLE_TABLET | Freq: Two times a day (BID) | ORAL | Status: DC
  Administered 2023-07-16 – 2023-07-17 (×2): 81 mg via ORAL
  Filled 2023-07-16 (×2): qty 1

## 2023-07-16 MED ORDER — POLYETHYLENE GLYCOL 3350 17 G PO PACK: 17.0000 g | PACK | Freq: Every day | ORAL | Status: DC | PRN

## 2023-07-16 MED ORDER — ONDANSETRON HCL 4 MG/2ML IJ SOLN
INTRAMUSCULAR | Status: DC | PRN
  Administered 2023-07-16: 4 mg via INTRAVENOUS

## 2023-07-16 MED ORDER — METHOCARBAMOL 500 MG PO TABS
ORAL_TABLET | ORAL | Status: AC
  Filled 2023-07-16: qty 1

## 2023-07-16 MED ORDER — FENTANYL CITRATE (PF) 100 MCG/2ML IJ SOLN
INTRAMUSCULAR | Status: DC | PRN
Start: 2023-07-16 — End: 2023-07-16
  Administered 2023-07-16: 25 ug via INTRAVENOUS
  Administered 2023-07-16 (×2): 50 ug via INTRAVENOUS
  Administered 2023-07-16: 25 ug via INTRAVENOUS
  Administered 2023-07-16: 50 ug via INTRAVENOUS

## 2023-07-16 MED ORDER — OXYCODONE-ACETAMINOPHEN 5-325 MG PO TABS
1.0000 | ORAL_TABLET | Freq: Four times a day (QID) | ORAL | Status: DC
  Administered 2023-07-16 – 2023-07-17 (×3): 1 via ORAL
  Filled 2023-07-16 (×5): qty 1

## 2023-07-16 MED ORDER — PROPOFOL 500 MG/50ML IV EMUL
INTRAVENOUS | Status: DC | PRN
  Administered 2023-07-16: 160 ug/kg/min via INTRAVENOUS

## 2023-07-16 MED ORDER — OXYCODONE HCL 5 MG PO TABS
ORAL_TABLET | ORAL | Status: AC
  Filled 2023-07-16: qty 1

## 2023-07-16 MED ORDER — SODIUM CHLORIDE (PF) 0.9 % IJ SOLN
INTRAMUSCULAR | Status: AC
  Filled 2023-07-16: qty 20

## 2023-07-16 MED ORDER — DEXAMETHASONE SODIUM PHOSPHATE 10 MG/ML IJ SOLN
8.0000 mg | Freq: Once | INTRAMUSCULAR | Status: AC
  Administered 2023-07-16: 4 mg via INTRAVENOUS

## 2023-07-16 MED ORDER — PROPOFOL 10 MG/ML IV BOLUS
INTRAVENOUS | Status: DC | PRN
  Administered 2023-07-16: 110 mg via INTRAVENOUS

## 2023-07-16 MED ORDER — BISACODYL 10 MG RE SUPP: 10.0000 mg | Freq: Every day | RECTAL | Status: DC | PRN

## 2023-07-16 MED ORDER — ONDANSETRON HCL 4 MG/2ML IJ SOLN
INTRAMUSCULAR | Status: AC
  Filled 2023-07-16: qty 2

## 2023-07-16 MED ORDER — SODIUM CHLORIDE 0.9 % IV SOLN
INTRAVENOUS | Status: DC | PRN
  Administered 2023-07-16: 30 mL

## 2023-07-16 MED ORDER — ROCURONIUM BROMIDE 100 MG/10ML IV SOLN
INTRAVENOUS | Status: DC | PRN
  Administered 2023-07-16: 50 mg via INTRAVENOUS

## 2023-07-16 MED ORDER — SUGAMMADEX SODIUM 200 MG/2ML IV SOLN
INTRAVENOUS | Status: DC | PRN
  Administered 2023-07-16: 200 mg via INTRAVENOUS

## 2023-07-16 MED ORDER — ALLOPURINOL 300 MG PO TABS
300.0000 mg | ORAL_TABLET | Freq: Every day | ORAL | Status: DC
  Administered 2023-07-17: 300 mg via ORAL
  Filled 2023-07-16: qty 1

## 2023-07-16 MED ORDER — PHENYLEPHRINE 80 MCG/ML (10ML) SYRINGE FOR IV PUSH (FOR BLOOD PRESSURE SUPPORT)
PREFILLED_SYRINGE | INTRAVENOUS | Status: AC
Start: 2023-07-16 — End: ?
  Filled 2023-07-16: qty 10

## 2023-07-16 MED ORDER — METHOCARBAMOL 1000 MG/10ML IJ SOLN: 500.0000 mg | Freq: Four times a day (QID) | INTRAMUSCULAR | Status: DC | PRN

## 2023-07-16 MED ORDER — AMLODIPINE BESYLATE 10 MG PO TABS
10.0000 mg | ORAL_TABLET | Freq: Every morning | ORAL | Status: DC
  Administered 2023-07-17: 10 mg via ORAL
  Filled 2023-07-16: qty 1

## 2023-07-16 MED ORDER — BUPIVACAINE LIPOSOME 1.3 % IJ SUSP
INTRAMUSCULAR | Status: AC
  Filled 2023-07-16: qty 10

## 2023-07-16 MED ORDER — ORAL CARE MOUTH RINSE: 15.0000 mL | Freq: Once | OROMUCOSAL | Status: AC

## 2023-07-16 MED ORDER — ONDANSETRON HCL 4 MG PO TABS: 4.0000 mg | ORAL_TABLET | Freq: Four times a day (QID) | ORAL | Status: DC | PRN

## 2023-07-16 MED ORDER — PHENYLEPHRINE 80 MCG/ML (10ML) SYRINGE FOR IV PUSH (FOR BLOOD PRESSURE SUPPORT)
PREFILLED_SYRINGE | INTRAVENOUS | Status: DC | PRN
  Administered 2023-07-16: 160 ug via INTRAVENOUS

## 2023-07-16 MED ORDER — MAGNESIUM CITRATE PO SOLN: 1.0000 | Freq: Once | ORAL | Status: DC | PRN

## 2023-07-16 MED ORDER — PANTOPRAZOLE SODIUM 40 MG PO TBEC
40.0000 mg | DELAYED_RELEASE_TABLET | Freq: Every day | ORAL | Status: DC
  Administered 2023-07-16 – 2023-07-17 (×2): 40 mg via ORAL
  Filled 2023-07-16 (×2): qty 1

## 2023-07-16 MED ORDER — METFORMIN HCL ER 750 MG PO TB24
750.0000 mg | ORAL_TABLET | Freq: Every morning | ORAL | Status: DC
  Administered 2023-07-16 – 2023-07-17 (×2): 750 mg via ORAL
  Filled 2023-07-16 (×2): qty 1

## 2023-07-16 SURGICAL SUPPLY — 40 items
BAG COUNTER SPONGE SURGICOUNT (BAG) IMPLANT
BAG ZIPLOCK 12X15 (MISCELLANEOUS) IMPLANT
BIT DRILL TRIDENT 4X25 SU (BIT) IMPLANT
BLADE SAG 18X100X1.27 (BLADE) ×2 IMPLANT
CHLORAPREP W/TINT 26 (MISCELLANEOUS) ×2 IMPLANT
CLSR STERI-STRIP ANTIMIC 1/2X4 (GAUZE/BANDAGES/DRESSINGS) ×2 IMPLANT
COVER PERINEAL POST (MISCELLANEOUS) ×2 IMPLANT
COVER SURGICAL LIGHT HANDLE (MISCELLANEOUS) ×2 IMPLANT
DRAPE IMP U-DRAPE 54X76 (DRAPES) ×2 IMPLANT
DRAPE STERI IOBAN 125X83 (DRAPES) ×2 IMPLANT
DRAPE U-SHAPE 47X51 STRL (DRAPES) ×4 IMPLANT
DRSG MEPILEX POST OP 4X8 (GAUZE/BANDAGES/DRESSINGS) ×2 IMPLANT
ELECT PENCIL ROCKER SW 15FT (MISCELLANEOUS) ×2 IMPLANT
ELECT REM PT RETURN 15FT ADLT (MISCELLANEOUS) ×2 IMPLANT
GLOVE BIO SURGEON STRL SZ7.5 (GLOVE) ×2 IMPLANT
GLOVE BIOGEL PI IND STRL 7.5 (GLOVE) ×2 IMPLANT
GLOVE BIOGEL PI IND STRL 8 (GLOVE) ×2 IMPLANT
GLOVE SURG SYN 7.0 (GLOVE) ×1 IMPLANT
GLOVE SURG SYN 7.0 PF PI (GLOVE) ×2 IMPLANT
GOWN STRL REUS W/ TWL LRG LVL3 (GOWN DISPOSABLE) ×2 IMPLANT
GOWN STRL REUS W/ TWL XL LVL3 (GOWN DISPOSABLE) ×2 IMPLANT
HEAD BIOLOX HIP 36/-2.5 (Joint) IMPLANT
HOLDER FOLEY CATH W/STRAP (MISCELLANEOUS) IMPLANT
INSERT 0 DEGREE 36 (Miscellaneous) IMPLANT
KIT TURNOVER KIT A (KITS) IMPLANT
MANIFOLD NEPTUNE II (INSTRUMENTS) ×2 IMPLANT
NS IRRIG 1000ML POUR BTL (IV SOLUTION) ×2 IMPLANT
PACK ANTERIOR HIP CUSTOM (KITS) ×2 IMPLANT
PROTECTOR NERVE ULNAR (MISCELLANEOUS) ×2 IMPLANT
SCREW HEX LP 6.5X20 (Screw) IMPLANT
SHELL ACETAB TRIDENT 48 (Shell) IMPLANT
SPIKE FLUID TRANSFER (MISCELLANEOUS) ×4 IMPLANT
STEM STD OFFSET SZ5 36 (Stem) IMPLANT
SUT MNCRL AB 3-0 PS2 18 (SUTURE) ×2 IMPLANT
SUT VIC AB 0 CT1 36 (SUTURE) ×2 IMPLANT
SUT VIC AB 1 CT1 36 (SUTURE) ×2 IMPLANT
SUT VIC AB 2-0 CT1 TAPERPNT 27 (SUTURE) ×2 IMPLANT
TRAY FOLEY MTR SLVR 16FR STAT (SET/KITS/TRAYS/PACK) ×2 IMPLANT
TUBE SUCTION HIGH CAP CLEAR NV (SUCTIONS) ×2 IMPLANT
WATER STERILE IRR 1000ML POUR (IV SOLUTION) ×4 IMPLANT

## 2023-07-16 NOTE — Op Note (Signed)
 07/16/2023  9:04 AM  PATIENT:  April Shaffer   MRN: [id number]  PRE-OPERATIVE DIAGNOSIS:  Osteoarthritis LEFT HIP  POST-OPERATIVE DIAGNOSIS:  Osteoarthritis LEFT HIP  PROCEDURE:  Procedure(s): ARTHROPLASTY, HIP, TOTAL, ANTERIOR APPROACH  PREOPERATIVE INDICATIONS:    April Shaffer is an 73 y.o. female who has a diagnosis of <principal problem not specified> and elected for surgical management after failing conservative treatment.  The risks benefits and alternatives were discussed with the patient including but not limited to the risks of nonoperative treatment, versus surgical intervention including infection, bleeding, nerve injury, periprosthetic fracture, the need for revision surgery, dislocation, leg length discrepancy, blood clots, cardiopulmonary complications, morbidity, mortality, among others, and they were willing to proceed.     OPERATIVE REPORT     SURGEON:   Saundra Curl, MD    ASSISTANT:  Marzella Solan, PA-C, she was present and scrubbed throughout the case, critical for completion in a timely fashion, and for retraction, instrumentation, and closure.     ANESTHESIA:  General    COMPLICATIONS:  None.     COMPONENTS:  Stryker insignia femur size 5 with a 36 mm -2.5 head ball and an acetabular shell size 48 with a  polyethylene liner    PROCEDURE IN DETAIL:   The patient was met in the holding area and  identified.  The appropriate hip was identified and marked at the operative site.  The patient was then transported to the OR  and  placed under anesthesia per that record.  At that point, the patient was  placed in the supine position and  secured to the operating room table and all bony prominences padded. He received pre-operative antibiotics    The operative lower extremity was prepped from the iliac crest to the distal leg.  Sterile draping was performed.  Time out was performed prior to incision.      Skin incision was made just 2 cm lateral to the ASIS   extending in line with the tensor fascia lata. Electrocautery was used to control all bleeders. I dissected down sharply to the fascia of the tensor fascia lata was confirmed that the muscle fibers beneath were running posteriorly. I then incised the fascia over the superficial tensor fascia lata in line with the incision. The fascia was elevated off the anterior aspect of the muscle the muscle was retracted posteriorly and protected throughout the case. I then used electrocautery to incise the tensor fascia lata fascia control and all bleeders. Immediately visible was the fat over top of the anterior neck and capsule.  I removed the anterior fat from the capsule and elevated the rectus muscle off of the anterior capsule. I then removed a large time of capsule. The retractors were then placed over the anterior acetabulum as well as around the superior and inferior neck.  I then made a femoral neck cut. Then used the power corkscrew to remove the femoral head from the acetabulum and thoroughly irrigated the acetabulum. I sized the femoral head.    I then exposed the deep acetabulum, cleared out any tissue including the ligamentum teres.   After adequate visualization, I excised the labrum, and then sequentially reamed.  I then impacted the acetabular implant into place using fluoroscopy for guidance.  Appropriate version and inclination was confirmed clinically matching their bony anatomy, and with fluoroscopy.  I placed a 20 mm screw in the posterior/superio position with an excellent bite.    I then placed the polyethylene liner in place  I then adducted the leg and released the external rotators from the posterior femur allowing it to be easily delivered up lateral and anterior to the acetabulum for preparation of the femoral canal.    I then prepared the proximal femur using the cookie-cutter and then sequentially reamed and broached.  A trial broach, neck, and head was utilized, and I reduced  the hip and used floroscopy to assess the neck length and femoral implant.  I then impacted the femoral prosthesis into place into the appropriate version. The hip was then reduced and fluoroscopy confirmed appropriate position. Leg lengths were restored.  I then irrigated the hip copiously again with, and repaired the fascia with Vicryl, followed by monocryl for the subcutaneous tissue, Monocryl for the skin, Steri-Strips and sterile gauze. The patient was then awakened and returned to PACU in stable and satisfactory condition. There were no complications.  POST OPERATIVE PLAN: WBAT, DVT px: SCD's/TED, ambulation and chemical dvt px  Randal Bury, MD Orthopedic Surgeon [phone]

## 2023-07-16 NOTE — Anesthesia Postprocedure Evaluation (Signed)
 Anesthesia Post Note  Patient: April Shaffer  Procedure(s) Performed: ARTHROPLASTY, HIP, TOTAL, ANTERIOR APPROACH (Left: Hip)     Patient location during evaluation: PACU Anesthesia Type: General Level of consciousness: awake and alert Pain management: pain level controlled Vital Signs Assessment: post-procedure vital signs reviewed and stable Respiratory status: spontaneous breathing, nonlabored ventilation and respiratory function stable Cardiovascular status: blood pressure returned to baseline and stable Postop Assessment: no apparent nausea or vomiting Anesthetic complications: no  No notable events documented.  Last Vitals:  Vitals:   07/16/23 1021 07/16/23 1030  BP:  135/65  Pulse: 78 79  Resp: 11 13  Temp:  (!) 36.4 C  SpO2: 99% 94%    Last Pain:  Vitals:   07/16/23 1052  TempSrc:   PainSc: Asleep                 Lindsee Labarre,W. EDMOND

## 2023-07-16 NOTE — Plan of Care (Signed)
 Problem: Clinical Measurements: Goal: Ability to maintain clinical measurements within normal limits will improve Outcome: Progressing   Problem: Education: Goal: Knowledge of General Education information will improve Description: Including pain rating scale, medication(s)/side effects and non-pharmacologic comfort measures Outcome: Progressing   Problem: Activity: Goal: Risk for activity intolerance will decrease Outcome: Progressing   Problem: Coping: Goal: Level of anxiety will decrease Outcome: Progressing   Problem: Safety: Goal: Ability to remain free from injury will improve Outcome: Progressing   Problem: Pain Management: Goal: Pain level will decrease with appropriate interventions Outcome: Progressing   Ara Knee, RN 07/16/23 3:05 PM

## 2023-07-16 NOTE — Interval H&P Note (Signed)
 History and Physical Interval Note:  07/16/2023 7:10 AM  April Shaffer  has presented today for surgery, with the diagnosis of Osteoarthritis LEFT HIP.  The various methods of treatment have been discussed with the patient and family. After consideration of risks, benefits and other options for treatment, the patient has consented to  Procedure(s): ARTHROPLASTY, HIP, TOTAL, ANTERIOR APPROACH (Left) as a surgical intervention.  The patient's history has been reviewed, patient examined, no change in status, stable for surgery.  I have reviewed the patient's chart and labs.  Questions were answered to the patient's satisfaction.     Saundra Curl

## 2023-07-16 NOTE — Anesthesia Procedure Notes (Signed)
 Procedure Name: Intubation Date/Time: 07/16/2023 7:40 AM  Performed by: Josetta Niece, CRNAPre-anesthesia Checklist: Patient identified, Emergency Drugs available, Suction available and Patient being monitored Patient Re-evaluated:Patient Re-evaluated prior to induction Oxygen Delivery Method: Circle System Utilized Preoxygenation: Pre-oxygenation with 100% oxygen Induction Type: IV induction Ventilation: Mask ventilation without difficulty Laryngoscope Size: Mac and 3 Grade View: Grade II Tube type: Oral Tube size: 7.0 mm Number of attempts: 1 Airway Equipment and Method: Stylet and Oral airway Placement Confirmation: ETT inserted through vocal cords under direct vision, positive ETCO2 and breath sounds checked- equal and bilateral Secured at: 20 cm Tube secured with: Tape Dental Injury: Teeth and Oropharynx as per pre-operative assessment

## 2023-07-16 NOTE — Evaluation (Signed)
 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 Hx of THA 2020, lumbar fusion, DM  Clinical Impression  On eval POD 0, pt was Min A for mobility. She walked ~75 feet with a RW. "Soreness" reported with activity. Will plan to follow and progress activity as tolerated. Plan is for d/c home with OPPT f/u.       If plan is discharge home, recommend the following: A little help with walking and/or transfers;A little help with bathing/dressing/bathroom;Assistance with cooking/housework;Assist for transportation;Help with stairs or ramp for entrance   Can travel by private vehicle        Equipment Recommendations None recommended by PT  Recommendations for Other Services       Functional Status Assessment Patient has had a recent decline in their functional status and demonstrates the ability to make significant improvements in function in a reasonable and predictable amount of time.     Precautions / Restrictions Precautions Precautions: Fall Restrictions Weight Bearing Restrictions Per Provider Order: No LLE Weight Bearing Per Provider Order: Weight bearing as tolerated      Mobility  Bed Mobility Overal bed mobility: Needs Assistance Bed Mobility: Supine to Sit, Sit to Supine     Supine to sit: Min assist, HOB elevated, Used rails Sit to supine: Min assist, HOB elevated, Used rails   General bed mobility comments: Min a for L LE. Cues for safety, sequencing.    Transfers Overall transfer level: Needs assistance Equipment used: Rolling walker (2 wheels) Transfers: Sit to/from Stand Sit to Stand: Min assist           General transfer comment: Cues for safety, technique, hand placement. Increased time.    Ambulation/Gait Ambulation/Gait assistance: Min assist Gait Distance (Feet): 75 Feet Assistive device: Rolling walker (2  wheels) Gait Pattern/deviations: Step-through pattern, Decreased stride length       General Gait Details: Slow gait speed. Cue for safety.  Stairs            Wheelchair Mobility     Tilt Bed    Modified Rankin (Stroke Patients Only)       Balance Overall balance assessment: Needs assistance         Standing balance support: Bilateral upper extremity supported, During functional activity, Reliant on assistive device for balance Standing balance-Leahy Scale: Fair                               Pertinent Vitals/Pain Pain Assessment Pain Assessment: Faces Faces Pain Scale: Hurts little more Pain Location: L thigh Pain Descriptors / Indicators: Discomfort, Sore Pain Intervention(s): Limited activity within patient's tolerance, Monitored during session, Ice applied, Repositioned    Home Living Family/patient expects to be discharged to:: Private residence Living Arrangements: Spouse/significant other;Non-relatives/Friends Available Help at Discharge: Family;Available 24 hours/day Type of Home: House Home Access: Stairs to enter Entrance Stairs-Rails: None Entrance Stairs-Number of Steps: 1   Home Layout: One level Home Equipment: Agricultural consultant (2 wheels);Shower seat;Adaptive equipment      Prior Function Prior Level of Function : Independent/Modified Independent             Mobility Comments: uses a cane.       Extremity/Trunk Assessment   Upper Extremity Assessment Upper Extremity Assessment: Overall WFL for tasks assessed    Lower Extremity Assessment Lower Extremity Assessment:  Generalized weakness    Cervical / Trunk Assessment Cervical / Trunk Assessment: Kyphotic  Communication   Communication Communication: No apparent difficulties    Cognition Arousal: Alert Behavior During Therapy: WFL for tasks assessed/performed   PT - Cognitive impairments: No apparent impairments                         Following  commands: Intact       Cueing Cueing Techniques: Verbal cues     General Comments      Exercises     Assessment/Plan    PT Assessment Patient needs continued PT services  PT Problem List Decreased strength;Decreased range of motion;Decreased activity tolerance;Decreased balance;Decreased mobility;Decreased knowledge of use of DME;Pain       PT Treatment Interventions DME instruction;Gait training;Stair training;Functional mobility training;Therapeutic activities;Therapeutic exercise;Patient/family education;Balance training    PT Goals (Current goals can be found in the Care Plan section)  Acute Rehab PT Goals Patient Stated Goal: home. regain independence PT Goal Formulation: With patient Time For Goal Achievement: 07/30/23 Potential to Achieve Goals: Good    Frequency 7X/week     Co-evaluation               AM-PAC PT "6 Clicks" Mobility  Outcome Measure Help needed turning from your back to your side while in a flat bed without using bedrails?: A Little Help needed moving from lying on your back to sitting on the side of a flat bed without using bedrails?: A Little Help needed moving to and from a bed to a chair (including a wheelchair)?: A Little Help needed standing up from a chair using your arms (e.g., wheelchair or bedside chair)?: A Little Help needed to walk in hospital room?: A Little Help needed climbing 3-5 steps with a railing? : A Little 6 Click Score: 18    End of Session Equipment Utilized During Treatment: Gait belt Activity Tolerance: Patient tolerated treatment well Patient left: in bed;with call bell/phone within reach;with bed alarm set   PT Visit Diagnosis: Pain;Other abnormalities of gait and mobility (R26.89) Pain - Right/Left: Left Pain - part of body: Hip    Time: 7829-5621 PT Time Calculation (min) (ACUTE ONLY): 21 min   Charges:   PT Evaluation $PT Eval Low Complexity: 1 Low   PT General Charges $$ ACUTE PT VISIT: 1  Visit            Tanda Falter, PT Acute Rehabilitation  Office: [phone]

## 2023-07-16 NOTE — Discharge Instructions (Signed)

## 2023-07-16 NOTE — Transfer of Care (Signed)
 Immediate Anesthesia Transfer of Care Note  Patient: April Shaffer  Procedure(s) Performed: ARTHROPLASTY, HIP, TOTAL, ANTERIOR APPROACH (Left: Hip)  Patient Location: PACU  Anesthesia Type:General  Level of Consciousness: awake and alert   Airway & Oxygen Therapy: Patient Spontanous Breathing and Patient connected to face mask oxygen  Post-op Assessment: Report given to RN and Post -op Vital signs reviewed and stable  Post vital signs: Reviewed and stable  Last Vitals:  Vitals Value Taken Time  BP 90/57 07/16/23 0919  Temp    Pulse 75 07/16/23 0927  Resp 19 07/16/23 0927  SpO2 100 % 07/16/23 0927  Vitals shown include unfiled device data.  Last Pain:  Vitals:   07/16/23 0544  TempSrc: Oral         Complications: No notable events documented.

## 2023-07-17 ENCOUNTER — Inpatient Hospital Stay: Payer: Medicare HMO

## 2023-07-17 DIAGNOSIS — M1612 Unilateral primary osteoarthritis, left hip: Secondary | ICD-10-CM | POA: Diagnosis not present

## 2023-07-17 MED ORDER — METHOCARBAMOL 750 MG PO TABS: 750.0000 mg | ORAL_TABLET | Freq: Three times a day (TID) | ORAL | 0 refills | Status: DC | PRN

## 2023-07-17 MED ORDER — ONDANSETRON 4 MG PO TBDP: 4.0000 mg | ORAL_TABLET | Freq: Three times a day (TID) | ORAL | 0 refills | Status: DC | PRN

## 2023-07-17 MED ORDER — SENNA-DOCUSATE SODIUM 8.6-50 MG PO TABS
2.0000 | ORAL_TABLET | Freq: Every day | ORAL | 1 refills | Status: DC | PRN
Start: 2023-07-17 — End: 2023-10-31

## 2023-07-17 MED ORDER — ASPIRIN 81 MG PO TBEC
81.0000 mg | DELAYED_RELEASE_TABLET | Freq: Two times a day (BID) | ORAL | 0 refills | Status: AC
Start: 2023-07-17 — End: ?

## 2023-07-17 MED ORDER — OXYCODONE HCL 5 MG PO TABS: 5.0000 mg | ORAL_TABLET | ORAL | 0 refills | Status: DC | PRN

## 2023-07-17 NOTE — Progress Notes (Signed)
 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 Hx of THA 2020, lumbar fusion, DM    PT Comments  2nd session for continued gait training and stair step training. Encouraged pt to ambulate often at home, as tolerated. Pt reports she has an OPPT appt for Friday. PT education completed.     If plan is discharge home, recommend the following: A little help with walking and/or transfers;A little help with bathing/dressing/bathroom;Assistance with cooking/housework;Assist for transportation;Help with stairs or ramp for entrance   Can travel by private vehicle        Equipment Recommendations  None recommended by PT    Recommendations for Other Services       Precautions / Restrictions Precautions Precautions: Fall Restrictions Weight Bearing Restrictions Per Provider Order: No LLE Weight Bearing Per Provider Order: Weight bearing as tolerated     Mobility  Bed Mobility Overal bed mobility: Needs Assistance Bed Mobility: Supine to Sit, Sit to Supine     Supine to sit: Supervision, HOB elevated Sit to supine: Supervision, HOB elevated   General bed mobility comments: Pt used gait belt as leg lifter. Increased time. Cues for technique.    Transfers Overall transfer level: Needs assistance Equipment used: Rolling walker (2 wheels) Transfers: Sit to/from Stand Sit to Stand: Supervision           General transfer comment: Cues for safety, technique, hand placement. Increased time.    Ambulation/Gait Ambulation/Gait assistance: Contact guard assist Gait Distance (Feet): 135 Feet Assistive device: Rolling walker (2 wheels) Gait Pattern/deviations: Step-through pattern, Decreased stride length       General Gait Details: Slow gait speed. Cues for safety. Pt denied dizziness.   Stairs Stairs: Yes Stairs assistance: Contact guard  assist Stair Management: Step to pattern, Forwards, With walker Number of Stairs: 1 General stair comments: Cues for safety, technique, sequencing.   Wheelchair Mobility     Tilt Bed    Modified Rankin (Stroke Patients Only)       Balance Overall balance assessment: Needs assistance         Standing balance support: Bilateral upper extremity supported, During functional activity, Reliant on assistive device for balance Standing balance-Leahy Scale: Fair                              Hotel manager: No apparent difficulties  Cognition Arousal: Alert Behavior During Therapy: WFL for tasks assessed/performed   PT - Cognitive impairments: No apparent impairments                         Following commands: Intact      Cueing Cueing Techniques: Verbal cues  Exercises     General Comments        Pertinent Vitals/Pain Pain Assessment Pain Assessment: Faces Faces Pain Scale: Hurts little more Pain Location: L thigh Pain Descriptors / Indicators: Discomfort, Sore Pain Intervention(s): Monitored during session    Home Living                          Prior Function            PT Goals (current goals can now be found in the care plan section) Progress towards PT goals: Progressing toward goals    Frequency  7X/week      PT Plan      Co-evaluation              AM-PAC PT "6 Clicks" Mobility   Outcome Measure  Help needed turning from your back to your side while in a flat bed without using bedrails?: None Help needed moving from lying on your back to sitting on the side of a flat bed without using bedrails?: None Help needed moving to and from a bed to a chair (including a wheelchair)?: A Little Help needed standing up from a chair using your arms (e.g., wheelchair or bedside chair)?: A Little Help needed to walk in hospital room?: A Little Help needed climbing 3-5 steps with a  railing? : A Little 6 Click Score: 20    End of Session Equipment Utilized During Treatment: Gait belt Activity Tolerance: Patient tolerated treatment well Patient left: in bed;with call bell/phone within reach   PT Visit Diagnosis: Pain;Other abnormalities of gait and mobility (R26.89) Pain - Right/Left: Left Pain - part of body: Hip     Time: 4098-1191 PT Time Calculation (min) (ACUTE ONLY): 24 min  Charges:    $Gait Training: 23-37 mins PT General Charges $$ ACUTE PT VISIT: 1 Visit                         Tanda Falter, PT Acute Rehabilitation  Office: [phone]

## 2023-07-17 NOTE — Discharge Summary (Signed)
 Physician Discharge Summary  Patient ID: April Shaffer MRN: [id number] DOB/AGE: 1950/09/28 73 y.o.  Admit date: 07/16/2023 Discharge date: 07/17/2023  Admission Diagnoses:  Left hip osteoarthritis  Discharge Diagnoses:  Principal Problem:   S/P total left hip arthroplasty   Past Medical History:  Diagnosis Date   Anemia    Arthritis    Blood transfusion without reported diagnosis    Carotid artery narrowing    Coronary artery disease    Cardiac catheterization November 2013: 50% ostial LAD stenosis 50% mid stenosis. 30% disease in the left circumflex.   Diabetes mellitus, type 2 (HCC)    Hyperlipidemia    Hypertension    Onychomycosis of toenail 07/31/2016   PAD (peripheral artery disease) (HCC)    Sleep apnea       Had surgery to correct   Thyroid  nodule     Surgeries: Procedure(s): ARTHROPLASTY, HIP, TOTAL, ANTERIOR APPROACH on 07/16/2023   Consultants (if any):   Discharged Condition: Improved  Hospital Course: April Shaffer is an 73 y.o. female who was admitted 07/16/2023 with a diagnosis of S/P total left hip arthroplasty and went to the operating room on 07/16/2023 and underwent the above named procedures.    She was given perioperative antibiotics:  Anti-infectives (From admission, onward)    Start     Dose/Rate Route Frequency Ordered Stop   07/16/23 1330  ceFAZolin  (ANCEF ) IVPB 2g/100 mL premix        2 g 200 mL/hr over 30 Minutes Intravenous Every 6 hours 07/16/23 1049 07/16/23 2042   07/16/23 0600  ceFAZolin  (ANCEF ) IVPB 2g/100 mL premix        2 g 200 mL/hr over 30 Minutes Intravenous On call to O.R. 07/16/23 9892 07/16/23 0727     .  She was given sequential compression devices, early ambulation, and ASA for DVT prophylaxis.  She benefited maximally from the hospital stay and there were no complications.    Recent vital signs:  Vitals:   07/17/23 0923 07/17/23 1450  BP: (!) 95/56 130/63  Pulse: 64 69  Resp: 16 15  Temp: 97.9 F (36.6  C) 98.1 F (36.7 C)  SpO2: 100% 100%    Recent laboratory studies:  Lab Results  Component Value Date   HGB 8.2 (L) 06/19/2023   HGB 11.5 (L) 03/28/2023   HGB 10.4 (L) 01/02/2023   Lab Results  Component Value Date   WBC 10.3 06/19/2023   PLT 368 06/19/2023   Lab Results  Component Value Date   INR 1.1 08/01/2022   Lab Results  Component Value Date   NA 136 07/08/2023   K 3.0 (L) 07/08/2023   CL 100 07/08/2023   CO2 23 07/08/2023   BUN 16 07/08/2023   CREATININE 0.91 07/08/2023   GLUCOSE 106 (H) 07/08/2023    Discharge Medications:   Allergies as of 07/17/2023       Reactions   Zestoretic  [lisinopril -hydrochlorothiazide ] Swelling   Angioedema  - Tongue swelling    Codeine Itching   Oxycodone  Itching   Patient is taking at this time   Ultram  [tramadol ] Itching   Zocor  [simvastatin ] Other (See Comments)   Muscle pain        Medication List     TAKE these medications    acetaminophen  500 MG tablet Commonly known as: TYLENOL  Take 500 mg by mouth every 6 (six) hours as needed for moderate pain.   allopurinol  300 MG tablet Commonly known as: ZYLOPRIM  Take 1 tablet (300 mg total)  by mouth daily.   amLODipine  10 MG tablet Commonly known as: NORVASC  Take 10 mg by mouth in the morning.   aspirin  EC 81 MG tablet Take 1 tablet (81 mg total) by mouth 2 (two) times daily. To prevent blood clots for 30 days after surgery. What changed:  when to take this additional instructions   atorvastatin  80 MG tablet Commonly known as: LIPITOR  Take 80 mg by mouth in the morning.   carvedilol  10 MG 24 hr capsule Commonly known as: COREG  CR Take 10 mg by mouth in the morning.   clopidogrel  75 MG tablet Commonly known as: PLAVIX  Take 75 mg by mouth in the morning.   cyanocobalamin  1000 MCG/ML injection Commonly known as: VITAMIN B12 Inject 1,000 mcg into the muscle every 30 (thirty) days.   ezetimibe  10 MG tablet Commonly known as: ZETIA  TAKE 1 TABLET EVERY  DAY (NEED MD APPOINTMENT) What changed: See the new instructions.   ferrous sulfate  325 (65 FE) MG tablet Take 325 mg by mouth in the morning.   gabapentin  300 MG capsule Commonly known as: NEURONTIN  Take 300 mg by mouth 3 (three) times daily.   glucose blood test strip Check sugars twice daily   hydrochlorothiazide  12.5 MG tablet Commonly known as: HYDRODIURIL  Take 12.5 mg by mouth in the morning.   metFORMIN  750 MG 24 hr tablet Commonly known as: GLUCOPHAGE -XR Take 750 mg by mouth in the morning.   methocarbamol  750 MG tablet Commonly known as: ROBAXIN  Take 1 tablet (750 mg total) by mouth every 8 (eight) hours as needed for muscle spasms.   metoprolol  tartrate 25 MG tablet Commonly known as: LOPRESSOR  Take 25 mg by mouth daily.   ondansetron  4 MG disintegrating tablet Commonly known as: ZOFRAN -ODT Take 1 tablet (4 mg total) by mouth every 8 (eight) hours as needed for nausea or vomiting.   ondansetron  4 MG tablet Commonly known as: ZOFRAN  Take 4 mg by mouth every 8 (eight) hours as needed for nausea or vomiting.   oxyCODONE  5 MG immediate release tablet Commonly known as: Roxicodone  Take 1 tablet (5 mg total) by mouth every 4 (four) hours as needed for severe pain (pain score 7-10) or breakthrough pain. after hip surgery that is not controlled with your normal daily dose of Percocet for chronic pain   oxyCODONE -acetaminophen  10-325 MG tablet Commonly known as: PERCOCET Take 1 tablet by mouth every 4 (four) hours as needed for pain. What changed: when to take this   sennosides-docusate sodium  8.6-50 MG tablet Commonly known as: SENOKOT-S Take 2 tablets by mouth daily as needed for constipation (while taking narcotics).               Discharge Care Instructions  (From admission, onward)           Start     Ordered   07/17/23 0000  Weight bearing as tolerated        07/17/23 1525            Diagnostic Studies: DG HIP UNILAT WITH PELVIS 1V  LEFT Result Date: 07/16/2023 CLINICAL DATA:  [id number] Surgery, elective [id number] EXAM: DG HIP (WITH OR WITHOUT PELVIS) 1V*L* COMPARISON:  None Available. FINDINGS: Eight fluoroscopic spot views of the pelvis and left hip obtained in the operating room. Sequential images during hip arthroplasty. Fluoroscopy time 12 seconds. Dose 0.91 mGy. Previous right hip arthroplasty. IMPRESSION: Intraoperative fluoroscopy during left hip arthroplasty. Electronically Signed   By: Chadwick Colonel M.D.   On: 07/16/2023 11:02  DG C-Arm 1-60 Min-No Report Result Date: 07/16/2023 Fluoroscopy was utilized by the requesting physician.  No radiographic interpretation.    Disposition: Discharge disposition: 01-Home or Self Care       Discharge Instructions     Call MD / Call 911   Complete by: As directed    If you experience chest pain or shortness of breath, CALL 911 and be transported to the hospital emergency room.  If you develope a fever above 101 F, pus (white drainage) or increased drainage or redness at the wound, or calf pain, call your surgeon's office.   Diet - low sodium heart healthy   Complete by: As directed    Discharge instructions   Complete by: As directed    You may bear weight as tolerated. Keep your dressing on and dry until follow up. Take medicine to prevent blood clots as directed. Take pain medicine as needed with the goal of transitioning to over the counter medicines. It is okay to take 2 tablets of your narcotic medicine instead of just 1 tablet in the first 1-2 days after surgery when pain is at the worst. Do not continue to do this for multiple days at a time beyond that though.    INSTRUCTIONS AFTER JOINT REPLACEMENT   Remove items at home which could result in a fall. This includes throw rugs or furniture in walking pathways ICE to the affected joint every three hours while awake for 30 minutes at a time, for at least the first 3-5 days, and then as needed for pain and  swelling.  Continue to use ice for pain and swelling. You may notice swelling that will progress down to the foot and ankle.  This is normal after surgery.  Elevate your leg when you are not up walking on it.   Continue to use the breathing machine you got in the hospital (incentive spirometer) which will help keep your temperature down.  It is common for your temperature to cycle up and down following surgery, especially at night when you are not up moving around and exerting yourself.  The breathing machine keeps your lungs expanded and your temperature down.   DIET:  As you were doing prior to hospitalization, we recommend a well-balanced diet.  DRESSING / WOUND CARE / SHOWERING  You may shower 3 days after surgery, but keep the wounds dry during showering.  You may use an occlusive plastic wrap (Press'n Seal for example) with blue painter's tape at edges, NO SOAKING/SUBMERGING IN THE BATHTUB.  If the bandage gets wet, call the office.   ACTIVITY  Increase activity slowly as tolerated, but follow the weight bearing instructions below.   No driving for 6 weeks or until further direction given by your physician.  You cannot drive while taking narcotics.  No lifting or carrying greater than 10 lbs. until further directed by your surgeon. Avoid periods of inactivity such as sitting longer than an hour when not asleep. This helps prevent blood clots.  You may return to work once you are authorized by your doctor.    WEIGHT BEARING   Weight bearing as tolerated with assist device (walker, cane, etc) as directed, use it as long as suggested by your surgeon or therapist, typically at least 4-6 weeks.   EXERCISES  Results after joint replacement surgery are often greatly improved when you follow the exercise, range of motion and muscle strengthening exercises prescribed by your doctor. Safety measures are also important to protect the joint  from further injury. Any time any of these exercises  cause you to have increased pain or swelling, decrease what you are doing until you are comfortable again and then slowly increase them. If you have problems or questions, call your caregiver or physical therapist for advice.   Rehabilitation is important following a joint replacement. After just a few days of immobilization, the muscles of the leg can become weakened and shrink (atrophy).  These exercises are designed to build up the tone and strength of the thigh and leg muscles and to improve motion. Often times heat used for twenty to thirty minutes before working out will loosen up your tissues and help with improving the range of motion but do not use heat for the first two weeks following surgery (sometimes heat can increase post-operative swelling).   These exercises can be done on a training (exercise) mat, on the floor, on a table or on a bed. Use whatever works the best and is most comfortable for you.    Use music or television while you are exercising so that the exercises are a pleasant break in your day. This will make your life better with the exercises acting as a break in your routine that you can look forward to.   Perform all exercises about fifteen times, three times per day or as directed.  You should exercise both the operative leg and the other leg as well.  Exercises include:   Quad Sets - Tighten up the muscle on the front of the thigh (Quad) and hold for 5-10 seconds.   Straight Leg Raises - With your knee straight (if you were given a brace, keep it on), lift the leg to 60 degrees, hold for 3 seconds, and slowly lower the leg.  Perform this exercise against resistance later as your leg gets stronger.  Leg Slides: Lying on your back, slowly slide your foot toward your buttocks, bending your knee up off the floor (only go as far as is comfortable). Then slowly slide your foot back down until your leg is flat on the floor again.  Angel Wings: Lying on your back spread your legs to  the side as far apart as you can without causing discomfort.  Hamstring Strength:  Lying on your back, push your heel against the floor with your leg straight by tightening up the muscles of your buttocks.  Repeat, but this time bend your knee to a comfortable angle, and push your heel against the floor.  You may put a pillow under the heel to make it more comfortable if necessary.   A rehabilitation program following joint replacement surgery can speed recovery and prevent re-injury in the future due to weakened muscles. Contact your doctor or a physical therapist for more information on knee rehabilitation.    CONSTIPATION  Constipation is defined medically as fewer than three stools per week and severe constipation as less than one stool per week.  Even if you have a regular bowel pattern at home, your normal regimen is likely to be disrupted due to multiple reasons following surgery.  Combination of anesthesia, postoperative narcotics, change in appetite and fluid intake all can affect your bowels.   YOU MUST use at least one of the following options; they are listed in order of increasing strength to get the job done.  They are all available over the counter, and you may need to use some, POSSIBLY even all of these options:    Drink plenty of fluids (prune  juice may be helpful) and high fiber foods Colace 100 mg by mouth twice a day  Senokot for constipation as directed and as needed Dulcolax (bisacodyl ), take with full glass of water   Miralax  (polyethylene glycol) once or twice a day as needed.  If you have tried all these things and are unable to have a bowel movement in the first 3-4 days after surgery call either your surgeon or your primary doctor.    If you experience loose stools or diarrhea, hold the medications until you stool forms back up.  If your symptoms do not get better within 1 week or if they get worse, check with your doctor.  If you experience "the worst abdominal pain  ever" or develop nausea or vomiting, please contact the office immediately for further recommendations for treatment.   ITCHING:  If you experience itching with your medications, try taking only a single pain pill, or even half a pain pill at a time.  You can also use Benadryl  over the counter for itching or also to help with sleep.   TED HOSE STOCKINGS:  Use stockings on both legs until for at least 2 weeks or as directed by physician office. They may be removed at night for sleeping.  MEDICATIONS:  See your medication summary on the "After Visit Summary" that nursing will review with you.  You may have some home medications which will be placed on hold until you complete the course of blood thinner medication.  It is important for you to complete the blood thinner medication as prescribed.  Take medicines as prescribed.   You have several different medicines that work in different ways. - Tylenol  is for mild to moderate pain. Try to take this medicine before turning to your narcotic medicines.  - Robaxin  is for muscle spasms. This medicine can make you drowsy. - Oxycodone  is a narcotic pain medicine.  Take this for severe pain. This medicine can be dehydrating / constipating. - Zofran  is for nausea and vomiting. - Senokot is for constipation prevention while taking pain medicine.  - Aspirin  is to prevent blood clots after surgery. YOU MUST TAKE THIS MEDICINE!!  PRECAUTIONS:  If you experience chest pain or shortness of breath - call 911 immediately for transfer to the hospital emergency department.   If you develop a fever greater that 101 F, purulent drainage from wound, increased redness or drainage from wound, foul odor from the wound/dressing, or calf pain - CONTACT YOUR SURGEON.                                                   FOLLOW-UP APPOINTMENTS:  If you do not already have a post-op appointment, please call the office [phone] for an appointment to be seen by Dr. Abigail Abler in 2  weeks.   OTHER INSTRUCTIONS:   MAKE SURE YOU:  Understand these instructions.  Get help right away if you are not doing well or get worse.    Thank you for letting us  be a part of your medical care team.  It is a privilege we respect greatly.  We hope these instructions will help you stay on track for a fast and full recovery!   Post-operative opioid taper instructions:   Complete by: As directed    POST-OPERATIVE OPIOID TAPER INSTRUCTIONS: It is important to wean off of your  opioid medication as soon as possible. If you do not need pain medication after your surgery it is ok to stop day one. Opioids include: Codeine, Hydrocodone (Norco, Vicodin), Oxycodone (Percocet, oxycontin ) and hydromorphone  amongst others.  Long term and even short term use of opiods can cause: Increased pain response Dependence Constipation Depression Respiratory depression And more.  Withdrawal symptoms can include Flu like symptoms Nausea, vomiting And more Techniques to manage these symptoms Hydrate well Eat regular healthy meals Stay active Use relaxation techniques(deep breathing, meditating, yoga) Do Not substitute Alcohol to help with tapering If you have been on opioids for less than two weeks and do not have pain than it is ok to stop all together.  Plan to wean off of opioids This plan should start within one week post op of your joint replacement. Maintain the same interval or time between taking each dose and first decrease the dose.  Cut the total daily intake of opioids by one tablet each day Next start to increase the time between doses. The last dose that should be eliminated is the evening dose.      TED hose   Complete by: As directed    Use stockings (TED hose) for 2 weeks on left leg(s).  You may remove them at night for sleeping.   Weight bearing as tolerated   Complete by: As directed         Follow-up Information     Saundra Curl, MD. Go on 07/31/2023.   Specialty:  Orthopedic Surgery Why: Your appointment is at 4:15pm Contact information: [address] [place] [address] [phone]                  Signed: Ander Bame Office [phone] 07/17/2023, 3:26 PM

## 2023-07-17 NOTE — TOC Transition Note (Signed)
 Transition of Care [facility]) - Discharge Note   Patient Details  Name: April Shaffer MRN: [id number] Date of Birth: 11-13-1950  Transition of Care [facility]) CM/SW Contact:  Delilah Fend, LCSW Phone Number: 07/17/2023, 11:16 AM   Clinical Narrative:     Met with pt who confirms she has needed DME in the home.   Plan for HEP.  No further TOC needs.  Final next level of care: Home/Self Care Barriers to Discharge: No Barriers Identified   Patient Goals and CMS Choice Patient states their goals for this hospitalization and ongoing recovery are:: return home          Discharge Placement                       Discharge Plan and Services Additional resources added to the After Visit Summary for                  DME Arranged: N/A DME Agency: NA                  Social Drivers of Health (SDOH) Interventions SDOH Screenings   Food Insecurity: No Food Insecurity (07/16/2023)  Housing: Low Risk  (07/16/2023)  Transportation Needs: No Transportation Needs (07/16/2023)  Utilities: Not At Risk (07/16/2023)  Depression (PHQ2-9): Low Risk  (07/05/2022)  Financial Resource Strain: Low Risk  (08/06/2018)  Physical Activity: Inactive (06/17/2017)  Social Connections: Socially Integrated (07/16/2023)  Stress: No Stress Concern Present (06/17/2017)  Tobacco Use: High Risk (07/16/2023)     Readmission Risk Interventions    05/10/2022    3:52 PM  Readmission Risk Prevention Plan  Transportation Screening Complete  PCP or Specialist Appt within 3-5 Days Complete  HRI or Home Care Consult Complete  Social Work Consult for Recovery Care Planning/Counseling Complete  Palliative Care Screening Not Applicable  Medication Review Oceanographer) Complete

## 2023-07-17 NOTE — Progress Notes (Signed)
 Physical Therapy Treatment Patient Details Name: April Shaffer MRN: [id number] DOB: April 12, 1950 Today's Date: 07/17/2023   History of Present Illness 73 yo female s/p L THA-DA 07/16/23. Hx of THA 2020, lumbar fusion, DM    PT Comments  Pt agreeable  to working with therapy. Pt reports pain as controlled. Will plan to have a 2nd session to practice stair step negotiation before d/c home.     If plan is discharge home, recommend the following: A little help with walking and/or transfers;A little help with bathing/dressing/bathroom;Assistance with cooking/housework;Assist for transportation;Help with stairs or ramp for entrance   Can travel by private vehicle        Equipment Recommendations  None recommended by PT    Recommendations for Other Services       Precautions / Restrictions Precautions Precautions: Fall Restrictions Weight Bearing Restrictions Per Provider Order: No LLE Weight Bearing Per Provider Order: Weight bearing as tolerated     Mobility  Bed Mobility Overal bed mobility: Needs Assistance Bed Mobility: Supine to Sit     Supine to sit: Supervision, HOB elevated, Used rails     General bed mobility comments: Pt used gait belt as leg lifter. Increased time. Cues for technique.    Transfers Overall transfer level: Needs assistance Equipment used: Rolling walker (2 wheels) Transfers: Sit to/from Stand Sit to Stand: Contact guard assist           General transfer comment: Cues for safety, technique, hand placement. Increased time.    Ambulation/Gait Ambulation/Gait assistance: Contact guard assist Gait Distance (Feet): 75 Feet Assistive device: Rolling walker (2 wheels) Gait Pattern/deviations: Step-through pattern, Decreased stride length       General Gait Details: Slow gait speed. Cues for safety. Pt denied dizziness.   Stairs             Wheelchair Mobility     Tilt Bed    Modified Rankin (Stroke Patients Only)        Balance Overall balance assessment: Needs assistance         Standing balance support: Bilateral upper extremity supported, During functional activity, Reliant on assistive device for balance Standing balance-Leahy Scale: Fair                              Hotel manager: No apparent difficulties  Cognition Arousal: Alert Behavior During Therapy: WFL for tasks assessed/performed   PT - Cognitive impairments: No apparent impairments                         Following commands: Intact      Cueing Cueing Techniques: Verbal cues  Exercises Total Joint Exercises Ankle Circles/Pumps: AROM, Both, 10 reps Quad Sets: AROM, Both, 10 reps Heel Slides: AAROM, Left, 10 reps Hip ABduction/ADduction: AAROM, Left, 10 reps    General Comments        Pertinent Vitals/Pain Pain Assessment Pain Assessment: Faces Faces Pain Scale: Hurts little more Pain Location: L thigh Pain Descriptors / Indicators: Discomfort, Sore Pain Intervention(s): Monitored during session, Repositioned    Home Living                          Prior Function            PT Goals (current goals can now be found in the care plan section) Progress towards PT goals: Progressing toward goals  Frequency    7X/week      PT Plan      Co-evaluation              AM-PAC PT "6 Clicks" Mobility   Outcome Measure  Help needed turning from your back to your side while in a flat bed without using bedrails?: A Little Help needed moving from lying on your back to sitting on the side of a flat bed without using bedrails?: A Little Help needed moving to and from a bed to a chair (including a wheelchair)?: A Little Help needed standing up from a chair using your arms (e.g., wheelchair or bedside chair)?: A Little Help needed to walk in hospital room?: A Little Help needed climbing 3-5 steps with a railing? : A Little 6 Click Score: 18    End of  Session Equipment Utilized During Treatment: Gait belt Activity Tolerance: Patient tolerated treatment well Patient left: in chair;with call bell/phone within reach   PT Visit Diagnosis: Pain;Other abnormalities of gait and mobility (R26.89) Pain - Right/Left: Left Pain - part of body: Hip     Time: 2355-7322 PT Time Calculation (min) (ACUTE ONLY): 21 min  Charges:    $Gait Training: 8-22 mins PT General Charges $$ ACUTE PT VISIT: 1 Visit                        Tanda Falter, PT Acute Rehabilitation  Office: [phone]

## 2023-07-17 NOTE — Plan of Care (Signed)

## 2023-07-17 NOTE — Care Management Obs Status (Signed)
 MEDICARE OBSERVATION STATUS NOTIFICATION   Patient Details  Name: LADAJAH SOLTYS MRN: [id number] Date of Birth: 1950-08-07   Medicare Observation Status Notification Given:  Yes    Delilah Fend, LCSW 07/17/2023, 11:12 AM

## 2023-07-17 NOTE — Progress Notes (Signed)
    Subjective: Patient reports pain as mild. Well controlled with medicines. Tolerating diet.  Urinating.   No CP, SOB.  Has mobilized OOB with PT.   Objective:   VITALS:   Vitals:   07/16/23 2103 07/17/23 0111 07/17/23 0702 07/17/23 0923  BP: 116/64 119/66 107/63 (!) 95/56  Pulse: 69 69 63 64  Resp: 15 14 16 16   Temp: 97.9 F (36.6 C) 97.8 F (36.6 C) 97.8 F (36.6 C) 97.9 F (36.6 C)  TempSrc: Oral Oral Oral   SpO2: 99% 100% 100% 100%  Weight:      Height:          Latest Ref Rng & Units 06/19/2023    1:29 PM 03/28/2023   12:29 PM 01/02/2023    8:49 AM  CBC  WBC 4.0 - 10.5 K/uL 10.3  9.2  11.5   Hemoglobin 12.0 - 15.0 g/dL 8.2  29.5  28.4   Hematocrit 36.0 - 46.0 % 25.6  36.2  32.5   Platelets 150 - 400 K/uL 368  304  333       Latest Ref Rng & Units 07/08/2023    1:47 PM 08/09/2022   12:52 AM 08/08/2022    5:58 AM  BMP  Glucose 70 - 99 mg/dL 132  440  102   BUN 8 - 23 mg/dL 16  16  17    Creatinine 0.44 - 1.00 mg/dL 7.25  3.66  4.40   Sodium 135 - 145 mmol/L 136  137  134   Potassium 3.5 - 5.1 mmol/L 3.0  3.6  3.7   Chloride 98 - 111 mmol/L 100  111  107   CO2 22 - 32 mmol/L 23  21  19    Calcium  8.9 - 10.3 mg/dL 8.5  7.9  8.0    Intake/Output      05/20 0701 05/21 0700 05/21 0701 05/22 0700   P.O. 597 100   I.V. (mL/kg) 691.8 (11.9)    IV Piggyback 400.2    Total Intake(mL/kg) 1689 (29.1) 100 (1.7)   Urine (mL/kg/hr) 500 (0.4)    Blood 200    Total Output 700    Net +989 +100        Urine Occurrence 1 x 2 x   Stool Occurrence 0 x 0 x      Physical Exam: General: NAD.  Sitting up in bed, calm, comfortable Resp: No increased wob Cardio: regular rate and rhythm ABD soft Neurologically intact MSK Neurovascularly intact Sensation intact distally Intact pulses distally Dorsiflexion/Plantar flexion intact Incision: dressing C/D/I   Assessment: 1 Day Post-Op  S/P Procedure(s) (LRB): ARTHROPLASTY, HIP, TOTAL, ANTERIOR APPROACH (Left) by Dr.  Deloras Fess. April Shaffer on 07/16/23  Principal Problem:   S/P total left hip arthroplasty   Plan:  Advance diet Up with therapy Incentive Spirometry Elevate and Apply ice  Weightbearing: WBAT LLE Insicional and dressing care: Dressings left intact until follow-up and Reinforce dressings as needed Orthopedic device(s): None Showering: Keep dressing dry VTE prophylaxis: Aspirin  81mg  BID x 30 days, SCDs, ambulation Pain control: meds PRN Follow - up plan: 2 weeks Contact information:  Randal Bury MD, Marzella Solan PA-C  Dispo: Home this afternoon once clears PT.     Lore Rode, PA-C Office [phone] 07/17/2023, 12:42 PM

## 2023-07-18 NOTE — Therapy (Signed)
 OUTPATIENT PHYSICAL THERAPY LOWER EXTREMITY EVALUATION  Patient Name: April Shaffer MRN: [id number] DOB:1951-01-11, 73 y.o., female Today's Date: 07/19/2023   PT End of Session - 07/19/23 1243     Visit Number 1    Number of Visits --   1-2x/week   Date for PT Re-Evaluation 09/13/23    Authorization Type [employer] MCR - LEFS    Progress Note Due on Visit 10    PT Start Time 1100    PT Stop Time 1142    PT Time Calculation (min) 42 min             Past Medical History:  Diagnosis Date   Anemia    Arthritis    Blood transfusion without reported diagnosis    Carotid artery narrowing    Coronary artery disease    Cardiac catheterization November 2013: 50% ostial LAD stenosis 50% mid stenosis. 30% disease in the left circumflex.   Diabetes mellitus, type 2 (HCC)    Hyperlipidemia    Hypertension    Onychomycosis of toenail 07/31/2016   PAD (peripheral artery disease) (HCC)    Sleep apnea       Had surgery to correct   Thyroid  nodule    Past Surgical History:  Procedure Laterality Date   ABDOMINAL AORTOGRAM W/LOWER EXTREMITY N/A 08/07/2021   Procedure: ABDOMINAL AORTOGRAM W/LOWER EXTREMITY;  Surgeon: Adine Hoof, MD;  Location: [facility] INVASIVE CV LAB;  Service: Cardiovascular;  Laterality: N/A;   ABDOMINAL AORTOGRAM W/LOWER EXTREMITY Right 10/23/2021   Procedure: ABDOMINAL AORTOGRAM W/LOWER EXTREMITY;  Surgeon: Adine Hoof, MD;  Location: [facility] INVASIVE CV LAB;  Service: Cardiovascular;  Laterality: Right;   ABDOMINAL HYSTERECTOMY     APPLICATION OF INTRAOPERATIVE CT SCAN N/A 11/30/2020   Procedure: APPLICATION OF INTRAOPERATIVE CT SCAN;  Surgeon: Isadora Mar, MD;  Location: [facility] OR;  Service: Neurosurgery;  Laterality: N/A;   BACK SURGERY     CARDIAC CATHETERIZATION     CATARACT EXTRACTION, BILATERAL Bilateral 2021   Dr. Brooks Cao   ENDARTERECTOMY  09/27/2011   Procedure: RIGT ENDARTERECTOMY CAROTID;  Surgeon: Palma Bob, MD;  Location: [facility] OR;   Service: Vascular;  Laterality: Right;   EPIDURAL BLOCK INJECTION  02/2008   Drs. Maxwell Spark   EYE SURGERY Bilateral 2021   cataract   gyn surgery  2004   total hysterectomy for mennorhagia,,salpingoophorectomy   INCISION AND DRAINAGE ABSCESS Left 05/08/2022   Procedure: ARTHROSCOPIC INCISION AND DRAINAGE ABSCESS, DISTAL CLAVICLE EXCISSION, SUBACROMIAL DECOMPRESSION, LOOSE BODY REMOVAL, EXTENSIVE DEBRIDEMENT;  Surgeon: Saundra Curl, MD;  Location: [facility] ORS;  Service: Orthopedics;  Laterality: Left;   LAMINECTOMY WITH POSTERIOR LATERAL ARTHRODESIS LEVEL 2 N/A 11/30/2020   Procedure: LUMBAR FOUR-FIVE, LUMBAR FIVE-SACRAL ONE POSTERIOR LATERAL FUSION WITH REVISION OF LUMBAR ONE-FIVE HARDWARE AND EXTENSION TO SACRAL ONE AND SACRAL TWO;  Surgeon: Isadora Mar, MD;  Location: [facility] OR;  Service: Neurosurgery;  Laterality: N/A;   LUMBAR WOUND DEBRIDEMENT N/A 05/25/2020   Procedure: LUMBAR WOUND IRRIGATION AND DEBRIDEMENT;  Surgeon: Isadora Mar, MD;  Location: [facility] OR;  Service: Neurosurgery;  Laterality: N/A;   PERIPHERAL VASCULAR INTERVENTION  08/07/2021   Procedure: PERIPHERAL VASCULAR INTERVENTION;  Surgeon: Adine Hoof, MD;  Location: [facility] INVASIVE CV LAB;  Service: Cardiovascular;;  Rt SFA   PERIPHERAL VASCULAR INTERVENTION Right 10/23/2021   Procedure: PERIPHERAL VASCULAR INTERVENTION;  Surgeon: Adine Hoof, MD;  Location: [facility] INVASIVE CV LAB;  Service: Cardiovascular;  Laterality: Right;  SFA   POSTERIOR LUMBAR  FUSION 4 LEVEL N/A 04/25/2020   Procedure: POSTERIOR LUMBAR INTERBODY FUSION LUMBAR ONE-TWO, LUMBAR TWO-THREE, LUMBAR THREE-FOUR,LUMBAR FOUR-FIVE.;  Surgeon: Isadora Mar, MD;  Location: [facility] OR;  Service: Neurosurgery;  Laterality: N/A;  posterior   SHOULDER ARTHROSCOPY Left 05/08/2022   Procedure: ARTHROSCOPY SHOULDER;  Surgeon: Saundra Curl, MD;  Location: [facility] ORS;  Service: Orthopedics;  Laterality: Left;   TONSILLECTOMY     TOTAL ABDOMINAL  HYSTERECTOMY W/ BILATERAL SALPINGOOPHORECTOMY Bilateral 2000   TOTAL HIP ARTHROPLASTY Right 08/22/2018   Procedure: TOTAL HIP ARTHROPLASTY;  Surgeon: Marlena Sima, MD;  Location: [facility] ORS;  Service: Orthopedics;  Laterality: Right;   TOTAL HIP ARTHROPLASTY Left 07/16/2023   Procedure: ARTHROPLASTY, HIP, TOTAL, ANTERIOR APPROACH;  Surgeon: Saundra Curl, MD;  Location: [facility] ORS;  Service: Orthopedics;  Laterality: Left;   TRANSCAROTID ARTERY REVASCULARIZATION  Left 08/07/2022   Procedure: Transcarotid Artery Revascularization;  Surgeon: Adine Hoof, MD;  Location: [facility] OR;  Service: Vascular;  Laterality: Left;   ULTRASOUND GUIDANCE FOR VASCULAR ACCESS Left 08/07/2022   Procedure: ULTRASOUND GUIDANCE FOR VASCULAR ACCESS;  Surgeon: Adine Hoof, MD;  Location: [facility] OR;  Service: Vascular;  Laterality: Left;   VARICOSE VEIN SURGERY  2008   stripping   Patient Active Problem List   Diagnosis Date Noted   S/P total left hip arthroplasty 07/16/2023   Asymptomatic carotid artery stenosis without infarction, left 08/07/2022   Observation after surgery 08/07/2022   Septic arthritis of shoulder (HCC) 05/07/2022   Anemia of chronic disease 04/10/2022   Lumbar pseudoarthrosis 01/24/2022   Occlusion and stenosis of right carotid artery 01/24/2022   Iron  deficiency anemia secondary to blood loss (chronic) 01/08/2022   B12 deficiency 08/31/2021   Medication monitoring encounter 12/12/2020   Nausea 12/12/2020   Hammer toes of both feet 11/09/2020   Acute maxillary sinusitis 08/08/2020   Hyperlipidemia 08/08/2020   Pain due to onychomycosis of toenails of both feet 08/08/2020   PICC (peripherally inserted central catheter) in place 06/22/2020   Therapeutic drug monitoring 06/22/2020   Wound infection after surgery 05/25/2020   Wound drainage 05/24/2020   Local infection of the skin and subcutaneous tissue, unspecified 05/24/2020   Body mass index (BMI) 27.0-27.9, adult  05/10/2020   S/P lumbar fusion 04/25/2020   Microalbuminuria due to type 2 diabetes mellitus (HCC) 08/07/2019   Primary localized osteoarthritis of right hip 08/22/2018   Carpal tunnel syndrome, bilateral 08/06/2018   Type 2 diabetes mellitus with peripheral neuropathy (HCC) 08/06/2018   Degenerative joint disease of right hip 08/06/2018   Preoperative clearance 03/26/2018   Upper back pain on left side 10/30/2016   Encounter for postoperative carotid endarterectomy surveillance 12/07/2015   Varicosities of leg 06/03/2015   Acute left lumbar radiculopathy 04/15/2014   Chronic sciatica of right side 03/17/2013   Diabetic peripheral neuropathy (HCC) 03/17/2013   Coronary artery disease    Carotid artery stenosis s/p CEA 2013 10/16/2011   Carotid artery occlusion without infarction, right 09/17/2011   Carotid bruit 08/28/2011   Gout 04/13/2011   DM type 2 with diabetic peripheral neuropathy (HCC) 04/02/2008   Hyperlipidemia associated with type 2 diabetes mellitus (HCC) 04/02/2008   Essential hypertension 04/02/2008   DEGENERATIVE DISC DISEASE, LUMBOSACRAL SPINE 04/02/2008    PCP: Shannan Dart., FNP  REFERRING PROVIDER: Saundra Curl, MD  THERAPY DIAG:  Pain in left hip  Muscle weakness  Unsteadiness on feet  REFERRING DIAG:  THA 5/20  Rationale for Evaluation and Treatment:  Rehabilitation  SUBJECTIVE:  PERTINENT PAST HISTORY:  DM II, gout, diabetic peripheral neuropathy (feet), CAD, hx of lumbar surgery, R THA         PRECAUTIONS: Fall  OP Date: 07/16/2023  2 weeks 07/30/2023  4 weeks 08/13/2023  6 weeks 08/27/2023  8 weeks 09/10/2023  10 weeks 09/24/2023  12 weeks 10/08/2023    WEIGHT BEARING RESTRICTIONS No  FALLS:  Has patient fallen in last 6 months? No, Number of falls: 0  MOI/History of condition:  Onset date: 5/20  SUBJECTIVE STATEMENT  LORRINDA RAMSTAD is a 73 y.o. female who presents to clinic with chief complaint of L hip pain following L A  THA on 5/20.  She reports she is sore but doing well.  She was using a walker and cane before surgery and would like to get back to using only the cane  She has a fairly sedentary lifestyle but her L hip pain got to the point that it was limiting her daily housework and ability to walk in the grocery store.  She has a boyfriend who can help at times "but I can do it on my own, I don't want to rely on anyone else". 1 STE house.   Red flags:  denies malaise, erythema / streaking, and chills / night sweats  Pain:  Are you having pain? Yes Pain location: L hip  NPRS scale:  4/10 to 9/10 Aggravating factors: walking, standing, moving around Relieving factors: rest and medication Pain description: sharp, dull, and aching Stage: Acute 24 hour pattern: NA   Occupation: NA  Assistive Device: walker and cane  Hand Dominance: NA  Patient Goals/Specific Activities: improve function of hip, and walk with least restrictive AD   OBJECTIVE:   DIAGNOSTIC FINDINGS:  NA  GENERAL OBSERVATION/GAIT: Slow antalgic gait using FWW  SENSATION: Light touch: Deficits bil feet   LE MMT:  MMT Right (Eval) Left (Eval)  Hip flexion (L2, L3) 3+   Knee extension (L3) 4   Knee flexion 3+   Hip abduction    Hip extension    Hip external rotation 3+   Hip internal rotation 3+   Hip adduction    Ankle dorsiflexion (L4)    Ankle plantarflexion (S1)    Ankle inversion    Ankle eversion    Great Toe ext (L5)    Grossly     (Blank rows = not tested, score listed is out of 5 possible points.  N = WNL, D = diminished, C = clear for gross weakness with myotome testing, * = concordant pain with testing)  LE ROM:  ROM Right (Eval) Left (Eval)  Hip flexion 110 90*  Hip extension    Hip abduction    Hip adduction    Hip internal rotation    Hip external rotation    Knee extension    Knee flexion    Ankle dorsiflexion    Ankle plantarflexion    Ankle inversion    Ankle eversion     (Blank  rows = not tested, N = WNL, * = concordant pain with testing)  Functional Tests  Eval    30'' STS: 6x  UE used? Y    10 m max gait speed: 23'', .43 m/s, AD: FWW  PATIENT SURVEYS:  LEFS: 31/80   TODAY'S TREATMENT: Therapeutic Exercise: Creating, reviewing, and completing below HEP   PATIENT EDUCATION ([place]/HM):  POC, diagnosis, prognosis, HEP, and outcome measures.  Pt educated via explanation, demonstration, and handout (HEP).  Pt confirms understanding verbally.   HOME EXERCISE PROGRAM: Access Code: [email or web address] URL: [email or web address] Date: 07/19/2023 Prepared by: Lesleigh Rash  Exercises - Hooklying Clamshell with Resistance  - 1 x daily - 7 x weekly - 3 sets - 10 reps - Supine Hip Adduction Isometric with Ball  - 1 x daily - 7 x weekly - 2 sets - 10 reps - 10'' hold - Supine Heel Slide with Strap  - 3 x daily - 7 x weekly - 2 sets - 10 reps - Seated Long Arc Quad  - 1 x daily - 7 x weekly - 3 sets - 10 reps  Treatment priorities   Eval                                                  ASSESSMENT:  CLINICAL IMPRESSION: Khilynn is a 73 y.o. female who presents to clinic with signs and sxs consistent with L hip pain and stiffness following direct anterior THA on 5/20.  Appears to be progressing as expected given time since surgery.  Provided HEP for some gentle hip exercises and ROM with instruction to hold clam at comfortable level with knees only slightly apart.  Jalaya will benefit from skilled PT to address relevant deficits to improve safety, comfort, and independence with daily activities and mobility.    OBJECTIVE IMPAIRMENTS: Pain, hip ROM, LE and hip strength, balance, gait  ACTIVITY LIMITATIONS: standing, walking, transfers, bending, squatting, stairs, self care, community ambulation  PERSONAL FACTORS: See medical history and pertinent history   REHAB POTENTIAL:  Good  CLINICAL DECISION MAKING: Evolving/moderate complexity  EVALUATION COMPLEXITY: Moderate   GOALS:   SHORT TERM GOALS: Target date: 08/16/2023   Venicia will be >75% HEP compliant to improve carryover between sessions and facilitate independent management of condition  Evaluation: ongoing Goal status: INITIAL   LONG TERM GOALS: Target date: 09/13/2023   Channah will self report >/= 50% decrease in pain from evaluation to improve function in daily tasks  Evaluation/Baseline: 9/10 max pain Goal status: INITIAL   2.  Jania will show a >/= 25 pt improvement in LEFS score (MCID is ~11% or 9 pts) as a proxy for functional improvement   Evaluation/Baseline: 31 pts Goal status: INITIAL   3.  Monna will improve 30'' STS (MCID 2) to >/= 8x (w/ UE?: n) to show improved LE strength and improved transfers   Evaluation/Baseline: 6x  w/ UE? y Goal status: INITIAL   4.  Kajal will improve 10 meter max gait speed to .8 m/s (.1 m/s MCID) to show functional improvement in ambulation   Evaluation/Baseline: .43 m/s w/ FWW Goal status: INITIAL   [place]:     5.  Loreta will improve the following MMTs to >/= 4/5 to show improvement in strength:     Evaluation/Baseline:  LE MMT:  MMT Right (Eval) Left (Eval)  Hip flexion (L2, L3) 3+   Knee extension (L3) 4   Knee flexion 3+   Hip abduction    Hip extension    Hip external rotation 3+   Hip internal rotation 3+   Hip adduction  Ankle dorsiflexion (L4)    Ankle plantarflexion (S1)    Ankle inversion    Ankle eversion    Great Toe ext (L5)    Grossly     (Blank rows = not tested, score listed is out of 5 possible points.  N = WNL, D = diminished, C = clear for gross weakness with myotome testing, * = concordant pain with testing)  Goal status: INITIAL   6.  Tacy will be able to complete all need housework and shop with LRAD with confidence and not limited by pain  Evaluation/Baseline: limited Goal  status: INITIAL   PLAN: PT FREQUENCY: 1-2x/week  PT DURATION: 8 weeks  PLANNED INTERVENTIONS:  97164- PT Re-evaluation, 97110-Therapeutic exercises, 97530- Therapeutic activity, [id number]- Neuromuscular re-education, 97535- Self Care, [id number]- Manual therapy, [id number]- Gait training, [id number]- Aquatic Therapy, [phone]- Electrical stimulation (manual), 97016- Vasopneumatic device, Taping, Dry Needling, Joint manipulation, and Spinal manipulation.   Lesleigh Rash PT, DPT 07/19/2023, 12:49 PM  Referring diagnosis?    What was this (referring dx) caused by? [x]  Surgery []  Fall []  Ongoing issue []  Arthritis []  Other: ____________  Laterality: []  Rt [x]  Lt []  Both  Check all possible CPT codes:  *CHOOSE 10 OR LESS*    See Planned Interventions listed in the Plan section of the Evaluation.

## 2023-07-19 ENCOUNTER — Ambulatory Visit: Attending: Orthopedic Surgery | Admitting: Physical Therapy

## 2023-07-19 ENCOUNTER — Encounter (HOSPITAL_COMMUNITY): Payer: Self-pay | Admitting: Orthopedic Surgery

## 2023-07-19 ENCOUNTER — Other Ambulatory Visit: Payer: Self-pay

## 2023-07-19 DIAGNOSIS — M6281 Muscle weakness (generalized): Secondary | ICD-10-CM | POA: Insufficient documentation

## 2023-07-19 DIAGNOSIS — R2681 Unsteadiness on feet: Secondary | ICD-10-CM | POA: Insufficient documentation

## 2023-07-19 DIAGNOSIS — M25552 Pain in left hip: Secondary | ICD-10-CM | POA: Diagnosis present

## 2023-07-23 ENCOUNTER — Ambulatory Visit: Admitting: Physical Therapy

## 2023-07-23 ENCOUNTER — Encounter: Payer: Self-pay | Admitting: Physical Therapy

## 2023-07-23 DIAGNOSIS — M25552 Pain in left hip: Secondary | ICD-10-CM

## 2023-07-23 DIAGNOSIS — M6281 Muscle weakness (generalized): Secondary | ICD-10-CM

## 2023-07-23 DIAGNOSIS — R2681 Unsteadiness on feet: Secondary | ICD-10-CM

## 2023-07-23 NOTE — Therapy (Signed)
 OUTPATIENT PHYSICAL THERAPY DAILY NOTE  Patient Name: April Shaffer MRN: [id number] DOB:10-09-1950, 73 y.o., female Today's Date: 07/23/2023   PT End of Session - 07/23/23 1102     Visit Number 2    Number of Visits --   1-2x/week   Date for PT Re-Evaluation 09/13/23    Authorization Type [employer] MCR - LEFS    Progress Note Due on Visit 10    PT Start Time 1100    PT Stop Time 1141    PT Time Calculation (min) 41 min             Past Medical History:  Diagnosis Date   Anemia    Arthritis    Blood transfusion without reported diagnosis    Carotid artery narrowing    Coronary artery disease    Cardiac catheterization November 2013: 50% ostial LAD stenosis 50% mid stenosis. 30% disease in the left circumflex.   Diabetes mellitus, type 2 (HCC)    Hyperlipidemia    Hypertension    Onychomycosis of toenail 07/31/2016   PAD (peripheral artery disease) (HCC)    Sleep apnea       Had surgery to correct   Thyroid  nodule    Past Surgical History:  Procedure Laterality Date   ABDOMINAL AORTOGRAM W/LOWER EXTREMITY N/A 08/07/2021   Procedure: ABDOMINAL AORTOGRAM W/LOWER EXTREMITY;  Surgeon: Adine Hoof, MD;  Location: [facility] INVASIVE CV LAB;  Service: Cardiovascular;  Laterality: N/A;   ABDOMINAL AORTOGRAM W/LOWER EXTREMITY Right 10/23/2021   Procedure: ABDOMINAL AORTOGRAM W/LOWER EXTREMITY;  Surgeon: Adine Hoof, MD;  Location: [facility] INVASIVE CV LAB;  Service: Cardiovascular;  Laterality: Right;   ABDOMINAL HYSTERECTOMY     APPLICATION OF INTRAOPERATIVE CT SCAN N/A 11/30/2020   Procedure: APPLICATION OF INTRAOPERATIVE CT SCAN;  Surgeon: Isadora Mar, MD;  Location: [facility] OR;  Service: Neurosurgery;  Laterality: N/A;   BACK SURGERY     CARDIAC CATHETERIZATION     CATARACT EXTRACTION, BILATERAL Bilateral 2021   Dr. Brooks Cao   ENDARTERECTOMY  09/27/2011   Procedure: RIGT ENDARTERECTOMY CAROTID;  Surgeon: Palma Bob, MD;  Location: [facility] OR;  Service: Vascular;   Laterality: Right;   EPIDURAL BLOCK INJECTION  02/2008   Drs. Maxwell Spark   EYE SURGERY Bilateral 2021   cataract   gyn surgery  2004   total hysterectomy for mennorhagia,,salpingoophorectomy   INCISION AND DRAINAGE ABSCESS Left 05/08/2022   Procedure: ARTHROSCOPIC INCISION AND DRAINAGE ABSCESS, DISTAL CLAVICLE EXCISSION, SUBACROMIAL DECOMPRESSION, LOOSE BODY REMOVAL, EXTENSIVE DEBRIDEMENT;  Surgeon: Saundra Curl, MD;  Location: [facility] ORS;  Service: Orthopedics;  Laterality: Left;   LAMINECTOMY WITH POSTERIOR LATERAL ARTHRODESIS LEVEL 2 N/A 11/30/2020   Procedure: LUMBAR FOUR-FIVE, LUMBAR FIVE-SACRAL ONE POSTERIOR LATERAL FUSION WITH REVISION OF LUMBAR ONE-FIVE HARDWARE AND EXTENSION TO SACRAL ONE AND SACRAL TWO;  Surgeon: Isadora Mar, MD;  Location: [facility] OR;  Service: Neurosurgery;  Laterality: N/A;   LUMBAR WOUND DEBRIDEMENT N/A 05/25/2020   Procedure: LUMBAR WOUND IRRIGATION AND DEBRIDEMENT;  Surgeon: Isadora Mar, MD;  Location: [facility] OR;  Service: Neurosurgery;  Laterality: N/A;   PERIPHERAL VASCULAR INTERVENTION  08/07/2021   Procedure: PERIPHERAL VASCULAR INTERVENTION;  Surgeon: Adine Hoof, MD;  Location: [facility] INVASIVE CV LAB;  Service: Cardiovascular;;  Rt SFA   PERIPHERAL VASCULAR INTERVENTION Right 10/23/2021   Procedure: PERIPHERAL VASCULAR INTERVENTION;  Surgeon: Adine Hoof, MD;  Location: [facility] INVASIVE CV LAB;  Service: Cardiovascular;  Laterality: Right;  SFA   POSTERIOR LUMBAR FUSION  4 LEVEL N/A 04/25/2020   Procedure: POSTERIOR LUMBAR INTERBODY FUSION LUMBAR ONE-TWO, LUMBAR TWO-THREE, LUMBAR THREE-FOUR,LUMBAR FOUR-FIVE.;  Surgeon: Isadora Mar, MD;  Location: [facility] OR;  Service: Neurosurgery;  Laterality: N/A;  posterior   SHOULDER ARTHROSCOPY Left 05/08/2022   Procedure: ARTHROSCOPY SHOULDER;  Surgeon: Saundra Curl, MD;  Location: [facility] ORS;  Service: Orthopedics;  Laterality: Left;   TONSILLECTOMY     TOTAL ABDOMINAL HYSTERECTOMY W/  BILATERAL SALPINGOOPHORECTOMY Bilateral 2000   TOTAL HIP ARTHROPLASTY Right 08/22/2018   Procedure: TOTAL HIP ARTHROPLASTY;  Surgeon: Marlena Sima, MD;  Location: [facility] ORS;  Service: Orthopedics;  Laterality: Right;   TOTAL HIP ARTHROPLASTY Left 07/16/2023   Procedure: ARTHROPLASTY, HIP, TOTAL, ANTERIOR APPROACH;  Surgeon: Saundra Curl, MD;  Location: [facility] ORS;  Service: Orthopedics;  Laterality: Left;   TRANSCAROTID ARTERY REVASCULARIZATION  Left 08/07/2022   Procedure: Transcarotid Artery Revascularization;  Surgeon: Adine Hoof, MD;  Location: [facility] OR;  Service: Vascular;  Laterality: Left;   ULTRASOUND GUIDANCE FOR VASCULAR ACCESS Left 08/07/2022   Procedure: ULTRASOUND GUIDANCE FOR VASCULAR ACCESS;  Surgeon: Adine Hoof, MD;  Location: [facility] OR;  Service: Vascular;  Laterality: Left;   VARICOSE VEIN SURGERY  2008   stripping   Patient Active Problem List   Diagnosis Date Noted   S/P total left hip arthroplasty 07/16/2023   Asymptomatic carotid artery stenosis without infarction, left 08/07/2022   Observation after surgery 08/07/2022   Septic arthritis of shoulder (HCC) 05/07/2022   Anemia of chronic disease 04/10/2022   Lumbar pseudoarthrosis 01/24/2022   Occlusion and stenosis of right carotid artery 01/24/2022   Iron  deficiency anemia secondary to blood loss (chronic) 01/08/2022   B12 deficiency 08/31/2021   Medication monitoring encounter 12/12/2020   Nausea 12/12/2020   Hammer toes of both feet 11/09/2020   Acute maxillary sinusitis 08/08/2020   Hyperlipidemia 08/08/2020   Pain due to onychomycosis of toenails of both feet 08/08/2020   PICC (peripherally inserted central catheter) in place 06/22/2020   Therapeutic drug monitoring 06/22/2020   Wound infection after surgery 05/25/2020   Wound drainage 05/24/2020   Local infection of the skin and subcutaneous tissue, unspecified 05/24/2020   Body mass index (BMI) 27.0-27.9, adult 05/10/2020   S/P  lumbar fusion 04/25/2020   Microalbuminuria due to type 2 diabetes mellitus (HCC) 08/07/2019   Primary localized osteoarthritis of right hip 08/22/2018   Carpal tunnel syndrome, bilateral 08/06/2018   Type 2 diabetes mellitus with peripheral neuropathy (HCC) 08/06/2018   Degenerative joint disease of right hip 08/06/2018   Preoperative clearance 03/26/2018   Upper back pain on left side 10/30/2016   Encounter for postoperative carotid endarterectomy surveillance 12/07/2015   Varicosities of leg 06/03/2015   Acute left lumbar radiculopathy 04/15/2014   Chronic sciatica of right side 03/17/2013   Diabetic peripheral neuropathy (HCC) 03/17/2013   Coronary artery disease    Carotid artery stenosis s/p CEA 2013 10/16/2011   Carotid artery occlusion without infarction, right 09/17/2011   Carotid bruit 08/28/2011   Gout 04/13/2011   DM type 2 with diabetic peripheral neuropathy (HCC) 04/02/2008   Hyperlipidemia associated with type 2 diabetes mellitus (HCC) 04/02/2008   Essential hypertension 04/02/2008   DEGENERATIVE DISC DISEASE, LUMBOSACRAL SPINE 04/02/2008    PCP: Shannan Dart., FNP  REFERRING PROVIDER: Shannan Dart., FNP  THERAPY DIAG:  Pain in left hip  Muscle weakness  Unsteadiness on feet  REFERRING DIAG:  THA 5/20  Rationale for Evaluation and Treatment:  Rehabilitation  SUBJECTIVE:  PERTINENT PAST HISTORY:  DM II, gout, diabetic peripheral neuropathy (feet), CAD, hx of lumbar surgery, R THA         PRECAUTIONS: Fall  OP Date: 07/16/2023  2 weeks 07/30/2023  4 weeks 08/13/2023  6 weeks 08/27/2023  8 weeks 09/10/2023  10 weeks 09/24/2023  12 weeks 10/08/2023    WEIGHT BEARING RESTRICTIONS No  FALLS:  Has patient fallen in last 6 months? No, Number of falls: 0  MOI/History of condition:  Onset date: 5/20  SUBJECTIVE STATEMENT  07/23/2023: Pt reports that she is doing well overall.  She has been working on some of her exercises at home.  EVAL: April Shaffer is a 73 y.o. female who presents to clinic with chief complaint of L hip pain following L A THA on 5/20.  She reports she is sore but doing well.  She was using a walker and cane before surgery and would like to get back to using only the cane  She has a fairly sedentary lifestyle but her L hip pain got to the point that it was limiting her daily housework and ability to walk in the grocery store.  She has a boyfriend who can help at times "but I can do it on my own, I don't want to rely on anyone else". 1 STE house.   Red flags:  denies malaise, erythema / streaking, and chills / night sweats  Pain:  Are you having pain? Yes Pain location: L hip  NPRS scale:  4/10 to 9/10 Aggravating factors: walking, standing, moving around Relieving factors: rest and medication Pain description: sharp, dull, and aching Stage: Acute 24 hour pattern: NA   Occupation: NA  Assistive Device: walker and cane  Hand Dominance: NA  Patient Goals/Specific Activities: improve function of hip, and walk with least restrictive AD   OBJECTIVE:   DIAGNOSTIC FINDINGS:  NA  GENERAL OBSERVATION/GAIT: Slow antalgic gait using FWW  SENSATION: Light touch: Deficits bil feet   LE MMT:  MMT Right (Eval) Left (Eval)  Hip flexion (L2, L3) 3+   Knee extension (L3) 4   Knee flexion 3+   Hip abduction    Hip extension    Hip external rotation 3+   Hip internal rotation 3+   Hip adduction    Ankle dorsiflexion (L4)    Ankle plantarflexion (S1)    Ankle inversion    Ankle eversion    Great Toe ext (L5)    Grossly     (Blank rows = not tested, score listed is out of 5 possible points.  N = WNL, D = diminished, C = clear for gross weakness with myotome testing, * = concordant pain with testing)  LE ROM:  ROM Right (Eval) Left (Eval)  Hip flexion 110 90*  Hip extension    Hip abduction    Hip adduction    Hip internal rotation    Hip external rotation    Knee extension    Knee flexion     Ankle dorsiflexion    Ankle plantarflexion    Ankle inversion    Ankle eversion     (Blank rows = not tested, N = WNL, * = concordant pain with testing)  Functional Tests  Eval    30'' STS: 6x  UE used? Y    10 m max gait speed: 23'', .43 m/s, AD: FWW  PATIENT SURVEYS:  LEFS: 31/80   TODAY'S TREATMENT:  [facility] Adult PT Treatment  07/23/2023:  Therapeutic Exercise: nu-step L5 83m while taking subjective and planning session with patient LTR - 20x ea Heel slide with strap Supine clam - RTB - 2x10 PROM hip flexion  - pt in supine Hip adduction squeeze - 5'' 2x10 HL march - 2x20 Hip flexor stretch to neutral LAQ - 2# - 2x10     HOME EXERCISE PROGRAM: Access Code: [email or web address] URL: [email or web address] Date: 07/19/2023 Prepared by: Lesleigh Rash  Exercises - Hooklying Clamshell with Resistance  - 1 x daily - 7 x weekly - 3 sets - 10 reps - Supine Hip Adduction Isometric with Ball  - 1 x daily - 7 x weekly - 2 sets - 10 reps - 10'' hold - Supine Heel Slide with Strap  - 3 x daily - 7 x weekly - 2 sets - 10 reps - Seated Long Arc Quad  - 1 x daily - 7 x weekly - 3 sets - 10 reps  Treatment priorities   5/27        Hip ext ROM                                          ASSESSMENT:  CLINICAL IMPRESSION:  07/23/2023:  April Shaffer tolerated session well with no adverse reaction.  We concentrated on on gentle hip strengthening and ROM.  Her hip flexion ROM is improved to ~100 degrees before pain onset after manual.  Her hip ext is significantly limited and I encouraged her to work on this with heel slide at home with the goal of reaching neutral.  Will progress as able.  EVAL: April Shaffer is a 73 y.o. female who presents to clinic with signs and sxs consistent with L hip pain and stiffness following direct anterior THA on 5/20.  Appears to be progressing as expected given time since surgery.   Provided HEP for some gentle hip exercises and ROM with instruction to hold clam at comfortable level with knees only slightly apart.  April Shaffer will benefit from skilled PT to address relevant deficits to improve safety, comfort, and independence with daily activities and mobility.    OBJECTIVE IMPAIRMENTS: Pain, hip ROM, LE and hip strength, balance, gait  ACTIVITY LIMITATIONS: standing, walking, transfers, bending, squatting, stairs, self care, community ambulation  PERSONAL FACTORS: See medical history and pertinent history   REHAB POTENTIAL: Good  CLINICAL DECISION MAKING: Evolving/moderate complexity  EVALUATION COMPLEXITY: Moderate   GOALS:   SHORT TERM GOALS: Target date: 08/16/2023   April Shaffer will be >75% HEP compliant to improve carryover between sessions and facilitate independent management of condition  Evaluation: ongoing Goal status: INITIAL   LONG TERM GOALS: Target date: 09/13/2023   April Shaffer will self report >/= 50% decrease in pain from evaluation to improve function in daily tasks  Evaluation/Baseline: 9/10 max pain Goal status: INITIAL   2.  April Shaffer will show a >/= 25 pt improvement in LEFS score (MCID is ~11% or 9 pts) as a proxy for functional improvement   Evaluation/Baseline: 31 pts Goal status: INITIAL   3.  April Shaffer will improve 30'' STS (MCID 2) to >/= 8x (w/ UE?: n) to show improved LE strength and improved transfers   Evaluation/Baseline: 6x  w/ UE? y Goal status: INITIAL   4.  April Shaffer will improve 10 meter max gait speed to .8 m/s (.  1 m/s MCID) to show functional improvement in ambulation   Evaluation/Baseline: .43 m/s w/ FWW Goal status: INITIAL   [place]:     5.  April Shaffer will improve the following MMTs to >/= 4/5 to show improvement in strength:     Evaluation/Baseline:  LE MMT:  MMT Right (Eval) Left (Eval)  Hip flexion (L2, L3) 3+   Knee extension (L3) 4   Knee flexion 3+   Hip abduction    Hip extension    Hip  external rotation 3+   Hip internal rotation 3+   Hip adduction    Ankle dorsiflexion (L4)    Ankle plantarflexion (S1)    Ankle inversion    Ankle eversion    Great Toe ext (L5)    Grossly     (Blank rows = not tested, score listed is out of 5 possible points.  N = WNL, D = diminished, C = clear for gross weakness with myotome testing, * = concordant pain with testing)  Goal status: INITIAL   6.  April Shaffer will be able to complete all need housework and shop with LRAD with confidence and not limited by pain  Evaluation/Baseline: limited Goal status: INITIAL   PLAN: PT FREQUENCY: 1-2x/week  PT DURATION: 8 weeks  PLANNED INTERVENTIONS:  [id number]- PT Re-evaluation, 97110-Therapeutic exercises, 97530- Therapeutic activity, [id number]- Neuromuscular re-education, 97535- Self Care, [id number]- Manual therapy, [id number]- Gait training, [id number]- Aquatic Therapy, [phone]- Electrical stimulation (manual), [id number]- Vasopneumatic device, Taping, Dry Needling, Joint manipulation, and Spinal manipulation.   Lesleigh Rash PT, DPT 07/23/2023, 11:48 AM  Referring diagnosis?    What was this (referring dx) caused by? [x]  Surgery []  Fall []  Ongoing issue []  Arthritis []  Other: ____________  Laterality: []  Rt [x]  Lt []  Both  Check all possible CPT codes:  *CHOOSE 10 OR LESS*    See Planned Interventions listed in the Plan section of the Evaluation.

## 2023-07-25 ENCOUNTER — Ambulatory Visit: Admitting: Physical Therapy

## 2023-07-25 ENCOUNTER — Encounter: Payer: Self-pay | Admitting: Physical Therapy

## 2023-07-25 DIAGNOSIS — M25552 Pain in left hip: Secondary | ICD-10-CM

## 2023-07-25 DIAGNOSIS — M6281 Muscle weakness (generalized): Secondary | ICD-10-CM

## 2023-07-25 DIAGNOSIS — R2681 Unsteadiness on feet: Secondary | ICD-10-CM

## 2023-07-25 NOTE — Therapy (Signed)
 OUTPATIENT PHYSICAL THERAPY DAILY NOTE  Patient Name: April Shaffer MRN: [id number] DOB:27-Dec-1950, 73 y.o., female Today's Date: 07/25/2023   PT End of Session - 07/25/23 1104     Visit Number 3    Date for PT Re-Evaluation 09/13/23    Authorization Type [employer] MCR - LEFS    Progress Note Due on Visit 10    PT Start Time 1100    PT Stop Time 1145    PT Time Calculation (min) 45 min             Past Medical History:  Diagnosis Date   Anemia    Arthritis    Blood transfusion without reported diagnosis    Carotid artery narrowing    Coronary artery disease    Cardiac catheterization November 2013: 50% ostial LAD stenosis 50% mid stenosis. 30% disease in the left circumflex.   Diabetes mellitus, type 2 (HCC)    Hyperlipidemia    Hypertension    Onychomycosis of toenail 07/31/2016   PAD (peripheral artery disease) (HCC)    Sleep apnea       Had surgery to correct   Thyroid  nodule    Past Surgical History:  Procedure Laterality Date   ABDOMINAL AORTOGRAM W/LOWER EXTREMITY N/A 08/07/2021   Procedure: ABDOMINAL AORTOGRAM W/LOWER EXTREMITY;  Surgeon: Adine Hoof, MD;  Location: [facility] INVASIVE CV LAB;  Service: Cardiovascular;  Laterality: N/A;   ABDOMINAL AORTOGRAM W/LOWER EXTREMITY Right 10/23/2021   Procedure: ABDOMINAL AORTOGRAM W/LOWER EXTREMITY;  Surgeon: Adine Hoof, MD;  Location: [facility] INVASIVE CV LAB;  Service: Cardiovascular;  Laterality: Right;   ABDOMINAL HYSTERECTOMY     APPLICATION OF INTRAOPERATIVE CT SCAN N/A 11/30/2020   Procedure: APPLICATION OF INTRAOPERATIVE CT SCAN;  Surgeon: Isadora Mar, MD;  Location: [facility] OR;  Service: Neurosurgery;  Laterality: N/A;   BACK SURGERY     CARDIAC CATHETERIZATION     CATARACT EXTRACTION, BILATERAL Bilateral 2021   Dr. Brooks Cao   ENDARTERECTOMY  09/27/2011   Procedure: RIGT ENDARTERECTOMY CAROTID;  Surgeon: Palma Bob, MD;  Location: [facility] OR;  Service: Vascular;  Laterality: Right;   EPIDURAL  BLOCK INJECTION  02/2008   Drs. Maxwell Spark   EYE SURGERY Bilateral 2021   cataract   gyn surgery  2004   total hysterectomy for mennorhagia,,salpingoophorectomy   INCISION AND DRAINAGE ABSCESS Left 05/08/2022   Procedure: ARTHROSCOPIC INCISION AND DRAINAGE ABSCESS, DISTAL CLAVICLE EXCISSION, SUBACROMIAL DECOMPRESSION, LOOSE BODY REMOVAL, EXTENSIVE DEBRIDEMENT;  Surgeon: Saundra Curl, MD;  Location: [facility] ORS;  Service: Orthopedics;  Laterality: Left;   LAMINECTOMY WITH POSTERIOR LATERAL ARTHRODESIS LEVEL 2 N/A 11/30/2020   Procedure: LUMBAR FOUR-FIVE, LUMBAR FIVE-SACRAL ONE POSTERIOR LATERAL FUSION WITH REVISION OF LUMBAR ONE-FIVE HARDWARE AND EXTENSION TO SACRAL ONE AND SACRAL TWO;  Surgeon: Isadora Mar, MD;  Location: [facility] OR;  Service: Neurosurgery;  Laterality: N/A;   LUMBAR WOUND DEBRIDEMENT N/A 05/25/2020   Procedure: LUMBAR WOUND IRRIGATION AND DEBRIDEMENT;  Surgeon: Isadora Mar, MD;  Location: [facility] OR;  Service: Neurosurgery;  Laterality: N/A;   PERIPHERAL VASCULAR INTERVENTION  08/07/2021   Procedure: PERIPHERAL VASCULAR INTERVENTION;  Surgeon: Adine Hoof, MD;  Location: [facility] INVASIVE CV LAB;  Service: Cardiovascular;;  Rt SFA   PERIPHERAL VASCULAR INTERVENTION Right 10/23/2021   Procedure: PERIPHERAL VASCULAR INTERVENTION;  Surgeon: Adine Hoof, MD;  Location: [facility] INVASIVE CV LAB;  Service: Cardiovascular;  Laterality: Right;  SFA   POSTERIOR LUMBAR FUSION 4 LEVEL N/A 04/25/2020   Procedure: POSTERIOR LUMBAR  INTERBODY FUSION LUMBAR ONE-TWO, LUMBAR TWO-THREE, LUMBAR THREE-FOUR,LUMBAR FOUR-FIVE.;  Surgeon: Isadora Mar, MD;  Location: [facility] OR;  Service: Neurosurgery;  Laterality: N/A;  posterior   SHOULDER ARTHROSCOPY Left 05/08/2022   Procedure: ARTHROSCOPY SHOULDER;  Surgeon: Saundra Curl, MD;  Location: [facility] ORS;  Service: Orthopedics;  Laterality: Left;   TONSILLECTOMY     TOTAL ABDOMINAL HYSTERECTOMY W/ BILATERAL SALPINGOOPHORECTOMY  Bilateral 2000   TOTAL HIP ARTHROPLASTY Right 08/22/2018   Procedure: TOTAL HIP ARTHROPLASTY;  Surgeon: Marlena Sima, MD;  Location: [facility] ORS;  Service: Orthopedics;  Laterality: Right;   TOTAL HIP ARTHROPLASTY Left 07/16/2023   Procedure: ARTHROPLASTY, HIP, TOTAL, ANTERIOR APPROACH;  Surgeon: Saundra Curl, MD;  Location: [facility] ORS;  Service: Orthopedics;  Laterality: Left;   TRANSCAROTID ARTERY REVASCULARIZATION  Left 08/07/2022   Procedure: Transcarotid Artery Revascularization;  Surgeon: Adine Hoof, MD;  Location: [facility] OR;  Service: Vascular;  Laterality: Left;   ULTRASOUND GUIDANCE FOR VASCULAR ACCESS Left 08/07/2022   Procedure: ULTRASOUND GUIDANCE FOR VASCULAR ACCESS;  Surgeon: Adine Hoof, MD;  Location: [facility] OR;  Service: Vascular;  Laterality: Left;   VARICOSE VEIN SURGERY  2008   stripping   Patient Active Problem List   Diagnosis Date Noted   S/P total left hip arthroplasty 07/16/2023   Asymptomatic carotid artery stenosis without infarction, left 08/07/2022   Observation after surgery 08/07/2022   Septic arthritis of shoulder (HCC) 05/07/2022   Anemia of chronic disease 04/10/2022   Lumbar pseudoarthrosis 01/24/2022   Occlusion and stenosis of right carotid artery 01/24/2022   Iron  deficiency anemia secondary to blood loss (chronic) 01/08/2022   B12 deficiency 08/31/2021   Medication monitoring encounter 12/12/2020   Nausea 12/12/2020   Hammer toes of both feet 11/09/2020   Acute maxillary sinusitis 08/08/2020   Hyperlipidemia 08/08/2020   Pain due to onychomycosis of toenails of both feet 08/08/2020   PICC (peripherally inserted central catheter) in place 06/22/2020   Therapeutic drug monitoring 06/22/2020   Wound infection after surgery 05/25/2020   Wound drainage 05/24/2020   Local infection of the skin and subcutaneous tissue, unspecified 05/24/2020   Body mass index (BMI) 27.0-27.9, adult 05/10/2020   S/P lumbar fusion 04/25/2020    Microalbuminuria due to type 2 diabetes mellitus (HCC) 08/07/2019   Primary localized osteoarthritis of right hip 08/22/2018   Carpal tunnel syndrome, bilateral 08/06/2018   Type 2 diabetes mellitus with peripheral neuropathy (HCC) 08/06/2018   Degenerative joint disease of right hip 08/06/2018   Preoperative clearance 03/26/2018   Upper back pain on left side 10/30/2016   Encounter for postoperative carotid endarterectomy surveillance 12/07/2015   Varicosities of leg 06/03/2015   Acute left lumbar radiculopathy 04/15/2014   Chronic sciatica of right side 03/17/2013   Diabetic peripheral neuropathy (HCC) 03/17/2013   Coronary artery disease    Carotid artery stenosis s/p CEA 2013 10/16/2011   Carotid artery occlusion without infarction, right 09/17/2011   Carotid bruit 08/28/2011   Gout 04/13/2011   DM type 2 with diabetic peripheral neuropathy (HCC) 04/02/2008   Hyperlipidemia associated with type 2 diabetes mellitus (HCC) 04/02/2008   Essential hypertension 04/02/2008   DEGENERATIVE DISC DISEASE, LUMBOSACRAL SPINE 04/02/2008    PCP: Shannan Dart., FNP  REFERRING PROVIDER: Saundra Curl, MD  THERAPY DIAG:  Pain in left hip  Muscle weakness  Unsteadiness on feet  REFERRING DIAG:  THA 5/20  Rationale for Evaluation and Treatment:  Rehabilitation  SUBJECTIVE:  PERTINENT PAST HISTORY:  DM II, gout,  diabetic peripheral neuropathy (feet), CAD, hx of lumbar surgery, R THA         PRECAUTIONS: Fall  OP Date: 07/16/2023  2 weeks 07/30/2023  4 weeks 08/13/2023  6 weeks 08/27/2023  8 weeks 09/10/2023  10 weeks 09/24/2023  12 weeks 10/08/2023    WEIGHT BEARING RESTRICTIONS No  FALLS:  Has patient fallen in last 6 months? No, Number of falls: 0  MOI/History of condition:  Onset date: 5/20  SUBJECTIVE STATEMENT:   07/25/2023: Pt reports that she is doing well overall, is ready for tape to be removed from her incision. Sees MD next week for 2 week FU.  EVAL: SIREN PORRATA is a 73 y.o. female who presents to clinic with chief complaint of L hip pain following L A THA on 5/20.  She reports she is sore but doing well.  She was using a walker and cane before surgery and would like to get back to using only the cane  She has a fairly sedentary lifestyle but her L hip pain got to the point that it was limiting her daily housework and ability to walk in the grocery store.  She has a boyfriend who can help at times "but I can do it on my own, I don't want to rely on anyone else". 1 STE house.   Red flags:  denies malaise, erythema / streaking, and chills / night sweats  Pain:  Are you having pain? Yes Pain location: L hip  NPRS scale:  4/10 to 9/10 Aggravating factors: walking, standing, moving around Relieving factors: rest and medication Pain description: sharp, dull, and aching Stage: Acute 24 hour pattern: NA   Occupation: NA  Assistive Device: walker and cane  Hand Dominance: NA  Patient Goals/Specific Activities: improve function of hip, and walk with least restrictive AD   OBJECTIVE:   DIAGNOSTIC FINDINGS:  NA  GENERAL OBSERVATION/GAIT: Slow antalgic gait using FWW  SENSATION: Light touch: Deficits bil feet   LE MMT:  MMT Right (Eval) Left (Eval)  Hip flexion (L2, L3) 3+   Knee extension (L3) 4   Knee flexion 3+   Hip abduction    Hip extension    Hip external rotation 3+   Hip internal rotation 3+   Hip adduction    Ankle dorsiflexion (L4)    Ankle plantarflexion (S1)    Ankle inversion    Ankle eversion    Great Toe ext (L5)    Grossly     (Blank rows = not tested, score listed is out of 5 possible points.  N = WNL, D = diminished, C = clear for gross weakness with myotome testing, * = concordant pain with testing)  LE ROM:  ROM Right (Eval) Left (Eval)  Hip flexion 110 90*  Hip extension    Hip abduction    Hip adduction    Hip internal rotation    Hip external rotation    Knee extension    Knee flexion     Ankle dorsiflexion    Ankle plantarflexion    Ankle inversion    Ankle eversion     (Blank rows = not tested, N = WNL, * = concordant pain with testing)  Functional Tests  Eval    30'' STS: 6x  UE used? Y    10 m max gait speed: 23'', .43 m/s, AD: FWW  PATIENT SURVEYS:  LEFS: 31/80   TODAY'S TREATMENT:  [facility] Adult PT Treatment  07/23/2023: Nustep L 3 UE/LE x 5 min  Standing with upright posture at RW  Mini squats at RW Heel raises at RW  Seated heel slides AA     LAQ - 2# - 2x10 LTR - 20x ea Heel slide with disc Supine clam - isometric into belt  Hip adduction squeeze - 5'' 2 x 10 HL gluteal squeeze 5 sec x 10  Ice pack left hip x 8 min  [facility] Adult PT Treatment:                                                DATE: 07/25/23 Therapeutic Exercise: nu-step L5 21m while taking subjective and planning session with patient LTR - 20x ea Heel slide with strap Supine clam - RTB - 2x10 PROM hip flexion  - pt in supine Hip adduction squeeze - 5'' 2x10 HL march - 2x20 Hip flexor stretch to neutral LAQ - 2# - 2x10     HOME EXERCISE PROGRAM: Access Code: [email or web address] URL: [email or web address] Date: 07/19/2023 Prepared by: Lesleigh Rash  Exercises - Hooklying Clamshell with Resistance  - 1 x daily - 7 x weekly - 3 sets - 10 reps - Supine Hip Adduction Isometric with Ball  - 1 x daily - 7 x weekly - 2 sets - 10 reps - 10'' hold - Supine Heel Slide with Strap  - 3 x daily - 7 x weekly - 2 sets - 10 reps - Seated Long Arc Quad  - 1 x daily - 7 x weekly - 3 sets - 10 reps  Treatment priorities   5/27        Hip ext ROM                                          ASSESSMENT:  CLINICAL IMPRESSION:  07/25/2023:  Adah Acron tolerated session well with no adverse reaction.  We concentrated on on gentle hip strengthening and ROM.  Her hip extension remains limited although did improve by end of  session. Cues provided for erect posture and forward gaze in RW to promote hip extension.  Will progress as able.  EVAL: Babita is a 73 y.o. female who presents to clinic with signs and sxs consistent with L hip pain and stiffness following direct anterior THA on 5/20.  Appears to be progressing as expected given time since surgery.  Provided HEP for some gentle hip exercises and ROM with instruction to hold clam at comfortable level with knees only slightly apart.  Dawsyn will benefit from skilled PT to address relevant deficits to improve safety, comfort, and independence with daily activities and mobility.    OBJECTIVE IMPAIRMENTS: Pain, hip ROM, LE and hip strength, balance, gait  ACTIVITY LIMITATIONS: standing, walking, transfers, bending, squatting, stairs, self care, community ambulation  PERSONAL FACTORS: See medical history and pertinent history   REHAB POTENTIAL: Good  CLINICAL DECISION MAKING: Evolving/moderate complexity  EVALUATION COMPLEXITY: Moderate   GOALS:   SHORT TERM GOALS: Target date: 08/16/2023   Lowella will be >75% HEP compliant to improve carryover between sessions and facilitate independent management of condition  Evaluation: ongoing Goal status: INITIAL   LONG TERM GOALS: Target date:  09/13/2023   Katilin will self report >/= 50% decrease in pain from evaluation to improve function in daily tasks  Evaluation/Baseline: 9/10 max pain Goal status: INITIAL   2.  Katalyn will show a >/= 25 pt improvement in LEFS score (MCID is ~11% or 9 pts) as a proxy for functional improvement   Evaluation/Baseline: 31 pts Goal status: INITIAL   3.  Jillisa will improve 30'' STS (MCID 2) to >/= 8x (w/ UE?: n) to show improved LE strength and improved transfers   Evaluation/Baseline: 6x  w/ UE? y Goal status: INITIAL   4.  Laporshia will improve 10 meter max gait speed to .8 m/s (.1 m/s MCID) to show functional improvement in ambulation    Evaluation/Baseline: .43 m/s w/ FWW Goal status: INITIAL   Norms:     5.  Layloni will improve the following MMTs to >/= 4/5 to show improvement in strength:     Evaluation/Baseline:  LE MMT:  MMT Right (Eval) Left (Eval)  Hip flexion (L2, L3) 3+   Knee extension (L3) 4   Knee flexion 3+   Hip abduction    Hip extension    Hip external rotation 3+   Hip internal rotation 3+   Hip adduction    Ankle dorsiflexion (L4)    Ankle plantarflexion (S1)    Ankle inversion    Ankle eversion    Great Toe ext (L5)    Grossly     (Blank rows = not tested, score listed is out of 5 possible points.  N = WNL, D = diminished, C = clear for gross weakness with myotome testing, * = concordant pain with testing)  Goal status: INITIAL   6.  Joice will be able to complete all need housework and shop with LRAD with confidence and not limited by pain  Evaluation/Baseline: limited Goal status: INITIAL   PLAN: PT FREQUENCY: 1-2x/week  PT DURATION: 8 weeks  PLANNED INTERVENTIONS:  97164- PT Re-evaluation, 97110-Therapeutic exercises, 97530- Therapeutic activity, [id number]- Neuromuscular re-education, 97535- Self Care, [id number]- Manual therapy, [id number]- Gait training, [id number]- Aquatic Therapy, [phone]- Electrical stimulation (manual), 97016- Vasopneumatic device, Taping, Dry Needling, Joint manipulation, and Spinal manipulation.  Gasper Karst, PTA 07/25/23 12:15 PM Phone: [phone] Fax: [phone]   Referring diagnosis?    What was this (referring dx) caused by? [x]  Surgery []  Fall []  Ongoing issue []  Arthritis []  Other: ____________  Laterality: []  Rt [x]  Lt []  Both  Check all possible CPT codes:  *CHOOSE 10 OR LESS*    See Planned Interventions listed in the Plan section of the Evaluation.

## 2023-07-30 ENCOUNTER — Ambulatory Visit: Attending: Orthopedic Surgery | Admitting: Physical Therapy

## 2023-07-30 ENCOUNTER — Encounter: Payer: Self-pay | Admitting: Physical Therapy

## 2023-07-30 DIAGNOSIS — R2681 Unsteadiness on feet: Secondary | ICD-10-CM | POA: Diagnosis present

## 2023-07-30 DIAGNOSIS — M25552 Pain in left hip: Secondary | ICD-10-CM | POA: Insufficient documentation

## 2023-07-30 DIAGNOSIS — M6281 Muscle weakness (generalized): Secondary | ICD-10-CM | POA: Diagnosis present

## 2023-07-30 NOTE — Therapy (Signed)
 OUTPATIENT PHYSICAL THERAPY DAILY NOTE  Patient Name: April Shaffer MRN: [id number] DOB:April 15, 1950, 73 y.o., female Today's Date: 07/30/2023   PT End of Session - 07/30/23 1058     Visit Number 4    Number of Visits --   1-2 x per week   Date for PT Re-Evaluation 09/13/23    Authorization Type [employer] MCR - LEFS    Authorization Time Period 07/19/23-08/31/23    Authorization - Visit Number 4    Authorization - Number of Visits 12    Progress Note Due on Visit 10    PT Start Time 1100    PT Stop Time 1150    PT Time Calculation (min) 50 min             Past Medical History:  Diagnosis Date   Anemia    Arthritis    Blood transfusion without reported diagnosis    Carotid artery narrowing    Coronary artery disease    Cardiac catheterization November 2013: 50% ostial LAD stenosis 50% mid stenosis. 30% disease in the left circumflex.   Diabetes mellitus, type 2 (HCC)    Hyperlipidemia    Hypertension    Onychomycosis of toenail 07/31/2016   PAD (peripheral artery disease) (HCC)    Sleep apnea       Had surgery to correct   Thyroid  nodule    Past Surgical History:  Procedure Laterality Date   ABDOMINAL AORTOGRAM W/LOWER EXTREMITY N/A 08/07/2021   Procedure: ABDOMINAL AORTOGRAM W/LOWER EXTREMITY;  Surgeon: Adine Hoof, MD;  Location: [facility] INVASIVE CV LAB;  Service: Cardiovascular;  Laterality: N/A;   ABDOMINAL AORTOGRAM W/LOWER EXTREMITY Right 10/23/2021   Procedure: ABDOMINAL AORTOGRAM W/LOWER EXTREMITY;  Surgeon: Adine Hoof, MD;  Location: [facility] INVASIVE CV LAB;  Service: Cardiovascular;  Laterality: Right;   ABDOMINAL HYSTERECTOMY     APPLICATION OF INTRAOPERATIVE CT SCAN N/A 11/30/2020   Procedure: APPLICATION OF INTRAOPERATIVE CT SCAN;  Surgeon: Isadora Mar, MD;  Location: [facility] OR;  Service: Neurosurgery;  Laterality: N/A;   BACK SURGERY     CARDIAC CATHETERIZATION     CATARACT EXTRACTION, BILATERAL Bilateral 2021   Dr. Brooks Cao    ENDARTERECTOMY  09/27/2011   Procedure: RIGT ENDARTERECTOMY CAROTID;  Surgeon: Palma Bob, MD;  Location: [facility] OR;  Service: Vascular;  Laterality: Right;   EPIDURAL BLOCK INJECTION  02/2008   Drs. Maxwell Spark   EYE SURGERY Bilateral 2021   cataract   gyn surgery  2004   total hysterectomy for mennorhagia,,salpingoophorectomy   INCISION AND DRAINAGE ABSCESS Left 05/08/2022   Procedure: ARTHROSCOPIC INCISION AND DRAINAGE ABSCESS, DISTAL CLAVICLE EXCISSION, SUBACROMIAL DECOMPRESSION, LOOSE BODY REMOVAL, EXTENSIVE DEBRIDEMENT;  Surgeon: Saundra Curl, MD;  Location: [facility] ORS;  Service: Orthopedics;  Laterality: Left;   LAMINECTOMY WITH POSTERIOR LATERAL ARTHRODESIS LEVEL 2 N/A 11/30/2020   Procedure: LUMBAR FOUR-FIVE, LUMBAR FIVE-SACRAL ONE POSTERIOR LATERAL FUSION WITH REVISION OF LUMBAR ONE-FIVE HARDWARE AND EXTENSION TO SACRAL ONE AND SACRAL TWO;  Surgeon: Isadora Mar, MD;  Location: [facility] OR;  Service: Neurosurgery;  Laterality: N/A;   LUMBAR WOUND DEBRIDEMENT N/A 05/25/2020   Procedure: LUMBAR WOUND IRRIGATION AND DEBRIDEMENT;  Surgeon: Isadora Mar, MD;  Location: [facility] OR;  Service: Neurosurgery;  Laterality: N/A;   PERIPHERAL VASCULAR INTERVENTION  08/07/2021   Procedure: PERIPHERAL VASCULAR INTERVENTION;  Surgeon: Adine Hoof, MD;  Location: [facility] INVASIVE CV LAB;  Service: Cardiovascular;;  Rt SFA   PERIPHERAL VASCULAR INTERVENTION Right 10/23/2021   Procedure: PERIPHERAL  VASCULAR INTERVENTION;  Surgeon: Adine Hoof, MD;  Location: [facility] INVASIVE CV LAB;  Service: Cardiovascular;  Laterality: Right;  SFA   POSTERIOR LUMBAR FUSION 4 LEVEL N/A 04/25/2020   Procedure: POSTERIOR LUMBAR INTERBODY FUSION LUMBAR ONE-TWO, LUMBAR TWO-THREE, LUMBAR THREE-FOUR,LUMBAR FOUR-FIVE.;  Surgeon: Isadora Mar, MD;  Location: [facility] OR;  Service: Neurosurgery;  Laterality: N/A;  posterior   SHOULDER ARTHROSCOPY Left 05/08/2022   Procedure: ARTHROSCOPY SHOULDER;  Surgeon:  Saundra Curl, MD;  Location: [facility] ORS;  Service: Orthopedics;  Laterality: Left;   TONSILLECTOMY     TOTAL ABDOMINAL HYSTERECTOMY W/ BILATERAL SALPINGOOPHORECTOMY Bilateral 2000   TOTAL HIP ARTHROPLASTY Right 08/22/2018   Procedure: TOTAL HIP ARTHROPLASTY;  Surgeon: Marlena Sima, MD;  Location: [facility] OR[facility];  Service: Orthopedics;  Laterality: Right;   TOTAL HIP ARTHROPLASTY Left 07/16/2023   Procedure: ARTHROPLASTY, HIP, TOTAL, ANTERIOR APPROACH;  Surgeon: Saundra Curl, MD;  Location: [facility] ORS;  Service: Orthopedics;  Laterality: Left;   TRANSCAROTID ARTERY REVASCULARIZATION  Left 08/07/2022   Procedure: Transcarotid Artery Revascularization;  Surgeon: Adine Hoof, MD;  Location: [facility] OR;  Service: Vascular;  Laterality: Left;   ULTRASOUND GUIDANCE FOR VASCULAR ACCESS Left 08/07/2022   Procedure: ULTRASOUND GUIDANCE FOR VASCULAR ACCESS;  Surgeon: Adine Hoof, MD;  Location: [facility] OR;  Service: Vascular;  Laterality: Left;   VARICOSE VEIN SURGERY  2008   stripping   Patient Active Problem List   Diagnosis Date Noted   S/P total left hip arthroplasty 07/16/2023   Asymptomatic carotid artery stenosis without infarction, left 08/07/2022   Observation after surgery 08/07/2022   Septic arthritis of shoulder (HCC) 05/07/2022   Anemia of chronic disease 04/10/2022   Lumbar pseudoarthrosis 01/24/2022   Occlusion and stenosis of right carotid artery 01/24/2022   Iron  deficiency anemia secondary to blood loss (chronic) 01/08/2022   B12 deficiency 08/31/2021   Medication monitoring encounter 12/12/2020   Nausea 12/12/2020   Hammer toes of both feet 11/09/2020   Acute maxillary sinusitis 08/08/2020   Hyperlipidemia 08/08/2020   Pain due to onychomycosis of toenails of both feet 08/08/2020   PICC (peripherally inserted central catheter) in place 06/22/2020   Therapeutic drug monitoring 06/22/2020   Wound infection after surgery 05/25/2020   Wound drainage 05/24/2020    Local infection of the skin and subcutaneous tissue, unspecified 05/24/2020   Body mass index (BMI) 27.0-27.9, adult 05/10/2020   S/P lumbar fusion 04/25/2020   Microalbuminuria due to type 2 diabetes mellitus (HCC) 08/07/2019   Primary localized osteoarthritis of right hip 08/22/2018   Carpal tunnel syndrome, bilateral 08/06/2018   Type 2 diabetes mellitus with peripheral neuropathy (HCC) 08/06/2018   Degenerative joint disease of right hip 08/06/2018   Preoperative clearance 03/26/2018   Upper back pain on left side 10/30/2016   Encounter for postoperative carotid endarterectomy surveillance 12/07/2015   Varicosities of leg 06/03/2015   Acute left lumbar radiculopathy 04/15/2014   Chronic sciatica of right side 03/17/2013   Diabetic peripheral neuropathy (HCC) 03/17/2013   Coronary artery disease    Carotid artery stenosis s/p CEA 2013 10/16/2011   Carotid artery occlusion without infarction, right 09/17/2011   Carotid bruit 08/28/2011   Gout 04/13/2011   DM type 2 with diabetic peripheral neuropathy (HCC) 04/02/2008   Hyperlipidemia associated with type 2 diabetes mellitus (HCC) 04/02/2008   Essential hypertension 04/02/2008   DEGENERATIVE DISC DISEASE, LUMBOSACRAL SPINE 04/02/2008    PCP: Shannan Dart., FNP  REFERRING PROVIDER: Saundra Curl, MD  THERAPY DIAG:  Pain in left hip  Muscle weakness  Unsteadiness on feet  REFERRING DIAG:  THA 5/20  Rationale for Evaluation and Treatment:  Rehabilitation  SUBJECTIVE:  PERTINENT PAST HISTORY:  DM II, gout, diabetic peripheral neuropathy (feet), CAD, hx of lumbar surgery, R THA         PRECAUTIONS: Fall  OP Date: 07/16/2023  2 weeks 07/30/2023  4 weeks 08/13/2023  6 weeks 08/27/2023  8 weeks 09/10/2023  10 weeks 09/24/2023  12 weeks 10/08/2023    WEIGHT BEARING RESTRICTIONS No  FALLS:  Has patient fallen in last 6 months? No, Number of falls: 0  MOI/History of condition:  Onset date: 5/20  SUBJECTIVE  STATEMENT:   07/30/2023: Pt reports she is trying to walk more upright.   EVAL: April Shaffer is a 73 y.o. female who presents to clinic with chief complaint of L hip pain following L A THA on 5/20.  She reports she is sore but doing well.  She was using a walker and cane before surgery and would like to get back to using only the cane  She has a fairly sedentary lifestyle but her L hip pain got to the point that it was limiting her daily housework and ability to walk in the grocery store.  She has a boyfriend who can help at times "but I can do it on my own, I don't want to rely on anyone else". 1 STE house.   Red flags:  denies malaise, erythema / streaking, and chills / night sweats  Pain:  Are you having pain? Yes Pain location: L hip  NPRS scale:  4/10 to 9/10 Aggravating factors: walking, standing, moving around Relieving factors: rest and medication Pain description: sharp, dull, and aching Stage: Acute 24 hour pattern: NA   Occupation: NA  Assistive Device: walker and cane  Hand Dominance: NA  Patient Goals/Specific Activities: improve function of hip, and walk with least restrictive AD   OBJECTIVE:   DIAGNOSTIC FINDINGS:  NA  GENERAL OBSERVATION/GAIT: Slow antalgic gait using FWW  SENSATION: Light touch: Deficits bil feet   LE MMT:  MMT Right (Eval) Left (Eval)  Hip flexion (L2, L3) 3+   Knee extension (L3) 4   Knee flexion 3+   Hip abduction    Hip extension    Hip external rotation 3+   Hip internal rotation 3+   Hip adduction    Ankle dorsiflexion (L4)    Ankle plantarflexion (S1)    Ankle inversion    Ankle eversion    Great Toe ext (L5)    Grossly     (Blank rows = not tested, score listed is out of 5 possible points.  N = WNL, D = diminished, C = clear for gross weakness with myotome testing, * = concordant pain with testing)  LE ROM:  ROM Right (Eval) Left (Eval)  Hip flexion 110 90*  Hip extension    Hip abduction    Hip  adduction    Hip internal rotation    Hip external rotation    Knee extension    Knee flexion    Ankle dorsiflexion    Ankle plantarflexion    Ankle inversion    Ankle eversion     (Blank rows = not tested, N = WNL, * = concordant pain with testing)  Functional Tests  Eval    30'' STS: 6x  UE used? Y    10 m max gait speed: 23'', .43 m/s, AD: FWW  PATIENT SURVEYS:  LEFS: 31/80   TODAY'S TREATMENT: [facility] Adult PT Treatment:                                                DATE: 07/30/23  Nustep L3 x 7 min Standing heel raises LAQ 2# alternating 2 x 20  Heel slide Left on slide board , left  QS into Towel roll left  SAQ Left Ball squeeze HL Supine hp abduction iso bilateral with belt- legs over pillows HL clam with belt isometric Bridge  x 10 Manual STW to left anterior / medial thigh  Ice pack left anterior hip     [facility] Adult PT Treatment  07/23/2023: Nustep L 3 UE/LE x 5 min  Standing with upright posture at RW  Mini squats at RW Heel raises at RW  Seated heel slides AA     LAQ - 2# - 2x10 LTR - 20x ea Heel slide with disc Supine clam - isometric into belt  Hip adduction squeeze - 5'' 2 x 10 HL gluteal squeeze 5 sec x 10  Ice pack left hip x 8 min  [facility] Adult PT Treatment:                                                DATE: 07/25/23 Therapeutic Exercise: nu-step L5 74m while taking subjective and planning session with patient LTR - 20x ea Heel slide with strap Supine clam - RTB - 2x10 PROM hip flexion  - pt in supine Hip adduction squeeze - 5'' 2x10 HL march - 2x20 Hip flexor stretch to neutral LAQ - 2# - 2x10     HOME EXERCISE PROGRAM: Access Code: [email or web address] URL: [email or web address] Date: 07/19/2023 Prepared by: Lesleigh Rash  Exercises - Hooklying Clamshell with Resistance  - 1 x daily - 7 x weekly - 3 sets - 10 reps - Supine Hip Adduction Isometric with Ball  -  1 x daily - 7 x weekly - 2 sets - 10 reps - 10'' hold - Supine Heel Slide with Strap  - 3 x daily - 7 x weekly - 2 sets - 10 reps - Seated Long Arc Quad  - 1 x daily - 7 x weekly - 3 sets - 10 reps  Treatment priorities   5/27        Hip ext ROM                                          ASSESSMENT:  CLINICAL IMPRESSION:  07/30/2023:  April Shaffer tolerated session well with no adverse reaction.  We concentrated on gentle hip strengthening and ROM.  Her hip extension remains limited although improved. She will see MD tomorrow for removal of bandage. She feels the bandage is restricting her motion. She feels pulling and discomfort in anterior hip with attempts to extend left hip to neutral. Manual STW provided to decrease anterior hip tension. Will continue to progress per protocol/ as tolerated.   EVAL: April Shaffer is a 73 y.o. female who presents to clinic with signs and sxs consistent with L hip pain and stiffness following direct anterior THA on 5/20.  Appears to be progressing as expected given time since surgery.  Provided HEP for some gentle hip exercises and ROM with instruction to hold clam at comfortable level with knees only slightly apart.  April Shaffer will benefit from skilled PT to address relevant deficits to improve safety, comfort, and independence with daily activities and mobility.    OBJECTIVE IMPAIRMENTS: Pain, hip ROM, LE and hip strength, balance, gait  ACTIVITY LIMITATIONS: standing, walking, transfers, bending, squatting, stairs, self care, community ambulation  PERSONAL FACTORS: See medical history and pertinent history   REHAB POTENTIAL: Good  CLINICAL DECISION MAKING: Evolving/moderate complexity  EVALUATION COMPLEXITY: Moderate   GOALS:   SHORT TERM GOALS: Target date: 08/16/2023   April Shaffer will be >75% HEP compliant to improve carryover between sessions and facilitate independent management of condition  Evaluation: ongoing Goal status: INITIAL   LONG TERM  GOALS: Target date: 09/13/2023   April Shaffer will self report >/= 50% decrease in pain from evaluation to improve function in daily tasks  Evaluation/Baseline: 9/10 max pain Goal status: INITIAL   2.  April Shaffer will show a >/= 25 pt improvement in LEFS score (MCID is ~11% or 9 pts) as a proxy for functional improvement   Evaluation/Baseline: 31 pts Goal status: INITIAL   3.  April Shaffer will improve 30'' STS (MCID 2) to >/= 8x (w/ UE?: n) to show improved LE strength and improved transfers   Evaluation/Baseline: 6x  w/ UE? y Goal status: INITIAL   4.  April Shaffer will improve 10 meter max gait speed to .8 m/s (.1 m/s MCID) to show functional improvement in ambulation   Evaluation/Baseline: .43 m/s w/ FWW Goal status: INITIAL   [place]:     5.  April Shaffer will improve the following MMTs to >/= 4/5 to show improvement in strength:     Evaluation/Baseline:  LE MMT:  MMT Right (Eval) Left (Eval)  Hip flexion (L2, L3) 3+   Knee extension (L3) 4   Knee flexion 3+   Hip abduction    Hip extension    Hip external rotation 3+   Hip internal rotation 3+   Hip adduction    Ankle dorsiflexion (L4)    Ankle plantarflexion (S1)    Ankle inversion    Ankle eversion    Great Toe ext (L5)    Grossly     (Blank rows = not tested, score listed is out of 5 possible points.  N = WNL, D = diminished, C = clear for gross weakness with myotome testing, * = concordant pain with testing)  Goal status: INITIAL   6.  April Shaffer will be able to complete all need housework and shop with LRAD with confidence and not limited by pain  Evaluation/Baseline: limited Goal status: INITIAL   PLAN: PT FREQUENCY: 1-2x/week  PT DURATION: 8 weeks  PLANNED INTERVENTIONS:  97164- PT Re-evaluation, 97110-Therapeutic exercises, 97530- Therapeutic activity, [id number]- Neuromuscular re-education, 97535- Self Care, 16109- Manual therapy, [id number]- Gait training, [id number]- Aquatic Therapy, [phone]- Electrical stimulation  (manual), 97016- Vasopneumatic device, Taping, Dry Needling, Joint manipulation, and Spinal manipulation.  Gasper Karst, PTA 07/30/23 11:49 AM Phone: [phone] Fax: [phone]   Referring diagnosis?    What was this (referring dx) caused by? [x]  Surgery []  Fall []  Ongoing issue []  Arthritis []  Other: ____________  Laterality: []  Rt [x]  Lt []  Both  Check all possible CPT codes:  *CHOOSE 10 OR LESS*    See Planned Interventions listed in the Plan section of the Evaluation.

## 2023-08-01 ENCOUNTER — Ambulatory Visit: Admitting: Physical Therapy

## 2023-08-01 DIAGNOSIS — M6281 Muscle weakness (generalized): Secondary | ICD-10-CM

## 2023-08-01 DIAGNOSIS — R2681 Unsteadiness on feet: Secondary | ICD-10-CM

## 2023-08-01 DIAGNOSIS — M25552 Pain in left hip: Secondary | ICD-10-CM | POA: Diagnosis not present

## 2023-08-01 NOTE — Therapy (Signed)
 OUTPATIENT PHYSICAL THERAPY DAILY NOTE  Patient Name: April Shaffer MRN: [id number] DOB:[birth year], 73 y.o., female Today's Date: 08/01/2023   PT End of Session - 08/01/23 1105     Visit Number 5    Number of Visits --   1-2 x per week   Date for PT Re-Evaluation 09/13/23    Authorization Type [employer] MCR - LEFS    Authorization Time Period 07/19/23-08/31/23    Authorization - Visit Number 5    Authorization - Number of Visits 12    Progress Note Due on Visit 10    PT Start Time 1100    PT Stop Time 1150    PT Time Calculation (min) 50 min             Past Medical History:  Diagnosis Date   Anemia    Arthritis    Blood transfusion without reported diagnosis    Carotid artery narrowing    Coronary artery disease    Cardiac catheterization November 2013: 50% ostial LAD stenosis 50% mid stenosis. 30% disease in the left circumflex.   Diabetes mellitus, type 2 (HCC)    Hyperlipidemia    Hypertension    Onychomycosis of toenail 07/31/2016   PAD (peripheral artery disease) (HCC)    Sleep apnea       Had surgery to correct   Thyroid  nodule    Past Surgical History:  Procedure Laterality Date   ABDOMINAL AORTOGRAM W/LOWER EXTREMITY N/A 08/07/2021   Procedure: ABDOMINAL AORTOGRAM W/LOWER EXTREMITY;  Surgeon: Adine Hoof, MD;  Location: [facility] INVASIVE CV LAB;  Service: Cardiovascular;  Laterality: N/A;   ABDOMINAL AORTOGRAM W/LOWER EXTREMITY Right 10/23/2021   Procedure: ABDOMINAL AORTOGRAM W/LOWER EXTREMITY;  Surgeon: Adine Hoof, MD;  Location: [facility] INVASIVE CV LAB;  Service: Cardiovascular;  Laterality: Right;   ABDOMINAL HYSTERECTOMY     APPLICATION OF INTRAOPERATIVE CT SCAN N/A 11/30/2020   Procedure: APPLICATION OF INTRAOPERATIVE CT SCAN;  Surgeon: Isadora Mar, MD;  Location: [facility] OR;  Service: Neurosurgery;  Laterality: N/A;   BACK SURGERY     CARDIAC CATHETERIZATION     CATARACT EXTRACTION, BILATERAL Bilateral 2021   Dr. Brooks Cao    ENDARTERECTOMY  09/27/2011   Procedure: RIGT ENDARTERECTOMY CAROTID;  Surgeon: Palma Bob, MD;  Location: [facility] OR;  Service: Vascular;  Laterality: Right;   EPIDURAL BLOCK INJECTION  02/2008   Drs. Maxwell Spark   EYE SURGERY Bilateral 2021   cataract   gyn surgery  2004   total hysterectomy for mennorhagia,,salpingoophorectomy   INCISION AND DRAINAGE ABSCESS Left 05/08/2022   Procedure: ARTHROSCOPIC INCISION AND DRAINAGE ABSCESS, DISTAL CLAVICLE EXCISSION, SUBACROMIAL DECOMPRESSION, LOOSE BODY REMOVAL, EXTENSIVE DEBRIDEMENT;  Surgeon: Saundra Curl, MD;  Location: [facility] ORS;  Service: Orthopedics;  Laterality: Left;   LAMINECTOMY WITH POSTERIOR LATERAL ARTHRODESIS LEVEL 2 N/A 11/30/2020   Procedure: LUMBAR FOUR-FIVE, LUMBAR FIVE-SACRAL ONE POSTERIOR LATERAL FUSION WITH REVISION OF LUMBAR ONE-FIVE HARDWARE AND EXTENSION TO SACRAL ONE AND SACRAL TWO;  Surgeon: Isadora Mar, MD;  Location: [facility] OR;  Service: Neurosurgery;  Laterality: N/A;   LUMBAR WOUND DEBRIDEMENT N/A 05/25/2020   Procedure: LUMBAR WOUND IRRIGATION AND DEBRIDEMENT;  Surgeon: Isadora Mar, MD;  Location: [facility] OR;  Service: Neurosurgery;  Laterality: N/A;   PERIPHERAL VASCULAR INTERVENTION  08/07/2021   Procedure: PERIPHERAL VASCULAR INTERVENTION;  Surgeon: Adine Hoof, MD;  Location: [facility] INVASIVE CV LAB;  Service: Cardiovascular;;  Rt SFA   PERIPHERAL VASCULAR INTERVENTION Right 10/23/2021   Procedure: PERIPHERAL  VASCULAR INTERVENTION;  Surgeon: Adine Hoof, MD;  Location: [facility] INVASIVE CV LAB;  Service: Cardiovascular;  Laterality: Right;  SFA   POSTERIOR LUMBAR FUSION 4 LEVEL N/A 04/25/2020   Procedure: POSTERIOR LUMBAR INTERBODY FUSION LUMBAR ONE-TWO, LUMBAR TWO-THREE, LUMBAR THREE-FOUR,LUMBAR FOUR-FIVE.;  Surgeon: Isadora Mar, MD;  Location: [facility] OR;  Service: Neurosurgery;  Laterality: N/A;  posterior   SHOULDER ARTHROSCOPY Left 05/08/2022   Procedure: ARTHROSCOPY SHOULDER;  Surgeon:  Saundra Curl, MD;  Location: [facility] ORS;  Service: Orthopedics;  Laterality: Left;   TONSILLECTOMY     TOTAL ABDOMINAL HYSTERECTOMY W/ BILATERAL SALPINGOOPHORECTOMY Bilateral 2000   TOTAL HIP ARTHROPLASTY Right 08/22/2018   Procedure: TOTAL HIP ARTHROPLASTY;  Surgeon: Marlena Sima, MD;  Location: [facility] ORS;  Service: Orthopedics;  Laterality: Right;   TOTAL HIP ARTHROPLASTY Left 07/16/2023   Procedure: ARTHROPLASTY, HIP, TOTAL, ANTERIOR APPROACH;  Surgeon: Saundra Curl, MD;  Location: [facility] ORS;  Service: Orthopedics;  Laterality: Left;   TRANSCAROTID ARTERY REVASCULARIZATION  Left 08/07/2022   Procedure: Transcarotid Artery Revascularization;  Surgeon: Adine Hoof, MD;  Location: [facility] OR;  Service: Vascular;  Laterality: Left;   ULTRASOUND GUIDANCE FOR VASCULAR ACCESS Left 08/07/2022   Procedure: ULTRASOUND GUIDANCE FOR VASCULAR ACCESS;  Surgeon: Adine Hoof, MD;  Location: [facility] OR;  Service: Vascular;  Laterality: Left;   VARICOSE VEIN SURGERY  2008   stripping   Patient Active Problem List   Diagnosis Date Noted   S/P total left hip arthroplasty 07/16/2023   Asymptomatic carotid artery stenosis without infarction, left 08/07/2022   Observation after surgery 08/07/2022   Septic arthritis of shoulder (HCC) 05/07/2022   Anemia of chronic disease 04/10/2022   Lumbar pseudoarthrosis 01/24/2022   Occlusion and stenosis of right carotid artery 01/24/2022   Iron  deficiency anemia secondary to blood loss (chronic) 01/08/2022   B12 deficiency 08/31/2021   Medication monitoring encounter 12/12/2020   Nausea 12/12/2020   Hammer toes of both feet 11/09/2020   Acute maxillary sinusitis 08/08/2020   Hyperlipidemia 08/08/2020   Pain due to onychomycosis of toenails of both feet 08/08/2020   PICC (peripherally inserted central catheter) in place 06/22/2020   Therapeutic drug monitoring 06/22/2020   Wound infection after surgery 05/25/2020   Wound drainage 05/24/2020    Local infection of the skin and subcutaneous tissue, unspecified 05/24/2020   Body mass index (BMI) 27.0-27.9, adult 05/10/2020   S/P lumbar fusion 04/25/2020   Microalbuminuria due to type 2 diabetes mellitus (HCC) 08/07/2019   Primary localized osteoarthritis of right hip 08/22/2018   Carpal tunnel syndrome, bilateral 08/06/2018   Type 2 diabetes mellitus with peripheral neuropathy (HCC) 08/06/2018   Degenerative joint disease of right hip 08/06/2018   Preoperative clearance 03/26/2018   Upper back pain on left side 10/30/2016   Encounter for postoperative carotid endarterectomy surveillance 12/07/2015   Varicosities of leg 06/03/2015   Acute left lumbar radiculopathy 04/15/2014   Chronic sciatica of right side 03/17/2013   Diabetic peripheral neuropathy (HCC) 03/17/2013   Coronary artery disease    Carotid artery stenosis s/p CEA 2013 10/16/2011   Carotid artery occlusion without infarction, right 09/17/2011   Carotid bruit 08/28/2011   Gout 04/13/2011   DM type 2 with diabetic peripheral neuropathy (HCC) 04/02/2008   Hyperlipidemia associated with type 2 diabetes mellitus (HCC) 04/02/2008   Essential hypertension 04/02/2008   DEGENERATIVE DISC DISEASE, LUMBOSACRAL SPINE 04/02/2008    PCP: Shannan Dart., FNP  REFERRING PROVIDER: Saundra Curl, MD  THERAPY DIAG:  Pain in left hip  Muscle weakness  Unsteadiness on feet  REFERRING DIAG:  THA 5/20  Rationale for Evaluation and Treatment:  Rehabilitation  SUBJECTIVE:  PERTINENT PAST HISTORY:  DM II, gout, diabetic peripheral neuropathy (feet), CAD, hx of lumbar surgery, R THA         PRECAUTIONS: Fall  OP Date: 07/16/2023  2 weeks 07/30/2023  4 weeks 08/13/2023  6 weeks 08/27/2023  8 weeks 09/10/2023  10 weeks 09/24/2023  12 weeks 10/08/2023    WEIGHT BEARING RESTRICTIONS No  FALLS:  Has patient fallen in last 6 months? No, Number of falls: 0  MOI/History of condition:  Onset date: 5/20  SUBJECTIVE  STATEMENT:   08/01/2023: Pt reports she saw MD who removed bandage. Stitches will dissolve. Will return in 4 weeks. Given paper order to continue PT. Pain is higher due to the weather, better after meds this morning.   EVAL: PRISCILA BEAN is a 73 y.o. female who presents to clinic with chief complaint of L hip pain following L A THA on 5/20.  She reports she is sore but doing well.  She was using a walker and cane before surgery and would like to get back to using only the cane  She has a fairly sedentary lifestyle but her L hip pain got to the point that it was limiting her daily housework and ability to walk in the grocery store.  She has a boyfriend who can help at times "but I can do it on my own, I don't want to rely on anyone else". 1 STE house.   Red flags:  denies malaise, erythema / streaking, and chills / night sweats  Pain:  Are you having pain? Yes Pain location: L hip  NPRS scale:  6/10  Aggravating factors: walking, standing, moving around Relieving factors: rest and medication Pain description: sharp, dull, and aching Stage: Acute 24 hour pattern: NA   Occupation: NA  Assistive Device: walker and cane  Hand Dominance: NA  Patient Goals/Specific Activities: improve function of hip, and walk with least restrictive AD   OBJECTIVE:   DIAGNOSTIC FINDINGS:  NA  GENERAL OBSERVATION/GAIT: Slow antalgic gait using FWW  SENSATION: Light touch: Deficits bil feet   LE MMT:  MMT Right (Eval) Left (Eval)  Hip flexion (L2, L3) 3+   Knee extension (L3) 4   Knee flexion 3+   Hip abduction    Hip extension    Hip external rotation 3+   Hip internal rotation 3+   Hip adduction    Ankle dorsiflexion (L4)    Ankle plantarflexion (S1)    Ankle inversion    Ankle eversion    Great Toe ext (L5)    Grossly     (Blank rows = not tested, score listed is out of 5 possible points.  N = WNL, D = diminished, C = clear for gross weakness with myotome testing, * =  concordant pain with testing)  LE ROM:  ROM Right (Eval) Left (Eval)  Hip flexion 110 90*  Hip extension    Hip abduction    Hip adduction    Hip internal rotation    Hip external rotation    Knee extension    Knee flexion    Ankle dorsiflexion    Ankle plantarflexion    Ankle inversion    Ankle eversion     (Blank rows = not tested, N = WNL, * = concordant pain with testing)  Functional Tests  Eval  30'' STS: 6x  UE used? Y    10 m max gait speed: 23'', .43 m/s, AD: FWW                                                      PATIENT SURVEYS:  LEFS: 31/80   TODAY'S TREATMENT: [facility] Adult PT Treatment:                                                DATE: 08/01/23  Nustep L5 UE/LE x 5 minutes  Heel raise  Marching alternating Hip abdct left  Side stepping at counter Forward gait along counter without UE  Mini squats at [employer] 3# alternating  STW left thigh  SAQ Bilateral  Left heel slide AA on slide board  Left quad set into ball Heel slide- much improved hip extension  Modalities: HMP left thigh avoiding incision x 10 minutes     [facility] Adult PT Treatment:                                                DATE: 07/30/23  Nustep L3 x 7 min Standing heel raises LAQ 2# alternating 2 x 20  Heel slide Left on slide board , left  QS into Towel roll left  SAQ Left Ball squeeze HL Supine hp abduction iso bilateral with belt- legs over pillows HL clam with belt isometric Bridge  x 10 Manual STW to left anterior / medial thigh  Ice pack left anterior hip     [facility] Adult PT Treatment  07/23/2023: Nustep L 3 UE/LE x 5 min  Standing with upright posture at RW  Mini squats at RW Heel raises at RW  Seated heel slides AA     LAQ - 2# - 2x10 LTR - 20x ea Heel slide with disc Supine clam - isometric into belt  Hip adduction squeeze - 5'' 2 x 10 HL gluteal squeeze 5 sec x 10  Ice pack left hip x 8 min  [facility] Adult PT Treatment:                                                 DATE: 07/25/23 Therapeutic Exercise: nu-step L5 8m while taking subjective and planning session with patient LTR - 20x ea Heel slide with strap Supine clam - RTB - 2x10 PROM hip flexion  - pt in supine Hip adduction squeeze - 5'' 2x10 HL march - 2x20 Hip flexor stretch to neutral LAQ - 2# - 2x10     HOME EXERCISE PROGRAM: Access Code: [email or web address] URL: [email or web address] Date: 07/19/2023 Prepared by: Lesleigh Rash  Exercises - Hooklying Clamshell with Resistance  - 1 x daily - 7 x weekly - 3 sets - 10 reps - Supine Hip Adduction Isometric with Ball  - 1 x daily - 7 x weekly - 2 sets - 10 reps - 10'' hold - Supine Heel Slide with Strap  -  3 x daily - 7 x weekly - 2 sets - 10 reps - Seated Long Arc Quad  - 1 x daily - 7 x weekly - 3 sets - 10 reps  Treatment priorities   5/27        Hip ext ROM                                          ASSESSMENT:  CLINICAL IMPRESSION:  08/01/2023:  Adah Acron tolerated session well with no adverse reaction.  We concentrated on gentle hip strengthening and ROM.  Her hip extension significantly improved following STW and quad activation. Will monitor carry over.  Saw MD who said x-rays looked good.  Will continue to progress per protocol/ as tolerated.   EVAL: Yvanna is a 73 y.o. female who presents to clinic with signs and sxs consistent with L hip pain and stiffness following direct anterior THA on 5/20.  Appears to be progressing as expected given time since surgery.  Provided HEP for some gentle hip exercises and ROM with instruction to hold clam at comfortable level with knees only slightly apart.  Briyonna will benefit from skilled PT to address relevant deficits to improve safety, comfort, and independence with daily activities and mobility.    OBJECTIVE IMPAIRMENTS: Pain, hip ROM, LE and hip strength, balance, gait  ACTIVITY LIMITATIONS: standing, walking, transfers, bending, squatting, stairs, self  care, community ambulation  PERSONAL FACTORS: See medical history and pertinent history   REHAB POTENTIAL: Good  CLINICAL DECISION MAKING: Evolving/moderate complexity  EVALUATION COMPLEXITY: Moderate   GOALS:   SHORT TERM GOALS: Target date: 08/16/2023   Rielly will be >75% HEP compliant to improve carryover between sessions and facilitate independent management of condition  Evaluation: ongoing Goal status: INITIAL   LONG TERM GOALS: Target date: 09/13/2023   Rogan will self report >/= 50% decrease in pain from evaluation to improve function in daily tasks  Evaluation/Baseline: 9/10 max pain Goal status: INITIAL   2.  Krystol will show a >/= 25 pt improvement in LEFS score (MCID is ~11% or 9 pts) as a proxy for functional improvement   Evaluation/Baseline: 31 pts Goal status: INITIAL   3.  Justyna will improve 30'' STS (MCID 2) to >/= 8x (w/ UE?: n) to show improved LE strength and improved transfers   Evaluation/Baseline: 6x  w/ UE? y Goal status: INITIAL   4.  Lotta will improve 10 meter max gait speed to .8 m/s (.1 m/s MCID) to show functional improvement in ambulation   Evaluation/Baseline: .43 m/s w/ FWW Goal status: INITIAL   Norms:     5.  Anora will improve the following MMTs to >/= 4/5 to show improvement in strength:     Evaluation/Baseline:  LE MMT:  MMT Right (Eval) Left (Eval)  Hip flexion (L2, L3) 3+   Knee extension (L3) 4   Knee flexion 3+   Hip abduction    Hip extension    Hip external rotation 3+   Hip internal rotation 3+   Hip adduction    Ankle dorsiflexion (L4)    Ankle plantarflexion (S1)    Ankle inversion    Ankle eversion    Great Toe ext (L5)    Grossly     (Blank rows = not tested, score listed is out of 5 possible points.  N = WNL, D = diminished, C = clear  for gross weakness with myotome testing, * = concordant pain with testing)  Goal status: INITIAL   6.  Norvell will be able to complete all  need housework and shop with LRAD with confidence and not limited by pain  Evaluation/Baseline: limited Goal status: INITIAL   PLAN: PT FREQUENCY: 1-2x/week  PT DURATION: 8 weeks  PLANNED INTERVENTIONS:  97164- PT Re-evaluation, 97110-Therapeutic exercises, 97530- Therapeutic activity, [id number]- Neuromuscular re-education, 97535- Self Care, 14431- Manual therapy, [id number]- Gait training, [id number]- Aquatic Therapy, [phone]- Electrical stimulation (manual), 97016- Vasopneumatic device, Taping, Dry Needling, Joint manipulation, and Spinal manipulation.  Gasper Karst, PTA 08/01/23 11:58 AM Phone: [phone] Fax: [phone]   Referring diagnosis?    What was this (referring dx) caused by? [x]  Surgery []  Fall []  Ongoing issue []  Arthritis []  Other: ____________  Laterality: []  Rt [x]  Lt []  Both  Check all possible CPT codes:  *CHOOSE 10 OR LESS*    See Planned Interventions listed in the Plan section of the Evaluation.

## 2023-08-06 ENCOUNTER — Ambulatory Visit: Admitting: Physical Therapy

## 2023-08-06 ENCOUNTER — Encounter: Payer: Self-pay | Admitting: Physical Therapy

## 2023-08-06 DIAGNOSIS — M25552 Pain in left hip: Secondary | ICD-10-CM

## 2023-08-06 DIAGNOSIS — M6281 Muscle weakness (generalized): Secondary | ICD-10-CM

## 2023-08-06 DIAGNOSIS — R2681 Unsteadiness on feet: Secondary | ICD-10-CM

## 2023-08-06 NOTE — Therapy (Addendum)
 OUTPATIENT PHYSICAL THERAPY DAILY NOTE  Patient Name: April Shaffer MRN: [id number] DOB:December 14, 1950, 73 y.o., female Today's Date: 08/06/2023   PT End of Session - 08/06/23 1100     Visit Number 6    Number of Visits --   1-2 x per week   Date for PT Re-Evaluation 09/13/23    Authorization Type [employer] MCR - LEFS    Authorization Time Period 07/19/23-08/31/23    Authorization - Visit Number 6    Authorization - Number of Visits 12    Progress Note Due on Visit 10    PT Start Time 1100    PT Stop Time 1140    PT Time Calculation (min) 40 min             Past Medical History:  Diagnosis Date   Anemia    Arthritis    Blood transfusion without reported diagnosis    Carotid artery narrowing    Coronary artery disease    Cardiac catheterization November 2013: 50% ostial LAD stenosis 50% mid stenosis. 30% disease in the left circumflex.   Diabetes mellitus, type 2 (HCC)    Hyperlipidemia    Hypertension    Onychomycosis of toenail 07/31/2016   PAD (peripheral artery disease) (HCC)    Sleep apnea       Had surgery to correct   Thyroid  nodule    Past Surgical History:  Procedure Laterality Date   ABDOMINAL AORTOGRAM W/LOWER EXTREMITY N/A 08/07/2021   Procedure: ABDOMINAL AORTOGRAM W/LOWER EXTREMITY;  Surgeon: Adine Hoof, MD;  Location: [facility] INVASIVE CV LAB;  Service: Cardiovascular;  Laterality: N/A;   ABDOMINAL AORTOGRAM W/LOWER EXTREMITY Right 10/23/2021   Procedure: ABDOMINAL AORTOGRAM W/LOWER EXTREMITY;  Surgeon: Adine Hoof, MD;  Location: [facility] INVASIVE CV LAB;  Service: Cardiovascular;  Laterality: Right;   ABDOMINAL HYSTERECTOMY     APPLICATION OF INTRAOPERATIVE CT SCAN N/A 11/30/2020   Procedure: APPLICATION OF INTRAOPERATIVE CT SCAN;  Surgeon: Isadora Mar, MD;  Location: [facility] OR;  Service: Neurosurgery;  Laterality: N/A;   BACK SURGERY     CARDIAC CATHETERIZATION     CATARACT EXTRACTION, BILATERAL Bilateral 2021   Dr. Brooks Cao    ENDARTERECTOMY  09/27/2011   Procedure: RIGT ENDARTERECTOMY CAROTID;  Surgeon: Palma Bob, MD;  Location: [facility] OR;  Service: Vascular;  Laterality: Right;   EPIDURAL BLOCK INJECTION  02/2008   Drs. Maxwell Spark   EYE SURGERY Bilateral 2021   cataract   gyn surgery  2004   total hysterectomy for mennorhagia,,salpingoophorectomy   INCISION AND DRAINAGE ABSCESS Left 05/08/2022   Procedure: ARTHROSCOPIC INCISION AND DRAINAGE ABSCESS, DISTAL CLAVICLE EXCISSION, SUBACROMIAL DECOMPRESSION, LOOSE BODY REMOVAL, EXTENSIVE DEBRIDEMENT;  Surgeon: Saundra Curl, MD;  Location: [facility] ORS;  Service: Orthopedics;  Laterality: Left;   LAMINECTOMY WITH POSTERIOR LATERAL ARTHRODESIS LEVEL 2 N/A 11/30/2020   Procedure: LUMBAR FOUR-FIVE, LUMBAR FIVE-SACRAL ONE POSTERIOR LATERAL FUSION WITH REVISION OF LUMBAR ONE-FIVE HARDWARE AND EXTENSION TO SACRAL ONE AND SACRAL TWO;  Surgeon: Isadora Mar, MD;  Location: [facility] OR;  Service: Neurosurgery;  Laterality: N/A;   LUMBAR WOUND DEBRIDEMENT N/A 05/25/2020   Procedure: LUMBAR WOUND IRRIGATION AND DEBRIDEMENT;  Surgeon: Isadora Mar, MD;  Location: [facility] OR;  Service: Neurosurgery;  Laterality: N/A;   PERIPHERAL VASCULAR INTERVENTION  08/07/2021   Procedure: PERIPHERAL VASCULAR INTERVENTION;  Surgeon: Adine Hoof, MD;  Location: [facility] INVASIVE CV LAB;  Service: Cardiovascular;;  Rt SFA   PERIPHERAL VASCULAR INTERVENTION Right 10/23/2021   Procedure: PERIPHERAL  VASCULAR INTERVENTION;  Surgeon: Adine Hoof, MD;  Location: [facility] INVASIVE CV LAB;  Service: Cardiovascular;  Laterality: Right;  SFA   POSTERIOR LUMBAR FUSION 4 LEVEL N/A 04/25/2020   Procedure: POSTERIOR LUMBAR INTERBODY FUSION LUMBAR ONE-TWO, LUMBAR TWO-THREE, LUMBAR THREE-FOUR,LUMBAR FOUR-FIVE.;  Surgeon: Isadora Mar, MD;  Location: [facility] OR;  Service: Neurosurgery;  Laterality: N/A;  posterior   SHOULDER ARTHROSCOPY Left 05/08/2022   Procedure: ARTHROSCOPY SHOULDER;  Surgeon:  Saundra Curl, MD;  Location: [facility] ORS;  Service: Orthopedics;  Laterality: Left;   TONSILLECTOMY     TOTAL ABDOMINAL HYSTERECTOMY W/ BILATERAL SALPINGOOPHORECTOMY Bilateral 2000   TOTAL HIP ARTHROPLASTY Right 08/22/2018   Procedure: TOTAL HIP ARTHROPLASTY;  Surgeon: Marlena Sima, MD;  Location: [facility] ORS;  Service: Orthopedics;  Laterality: Right;   TOTAL HIP ARTHROPLASTY Left 07/16/2023   Procedure: ARTHROPLASTY, HIP, TOTAL, ANTERIOR APPROACH;  Surgeon: Saundra Curl, MD;  Location: [facility] ORS;  Service: Orthopedics;  Laterality: Left;   TRANSCAROTID ARTERY REVASCULARIZATION  Left 08/07/2022   Procedure: Transcarotid Artery Revascularization;  Surgeon: Adine Hoof, MD;  Location: [facility] OR;  Service: Vascular;  Laterality: Left;   ULTRASOUND GUIDANCE FOR VASCULAR ACCESS Left 08/07/2022   Procedure: ULTRASOUND GUIDANCE FOR VASCULAR ACCESS;  Surgeon: Adine Hoof, MD;  Location: [facility] OR;  Service: Vascular;  Laterality: Left;   VARICOSE VEIN SURGERY  2008   stripping   Patient Active Problem List   Diagnosis Date Noted   S/P total left hip arthroplasty 07/16/2023   Asymptomatic carotid artery stenosis without infarction, left 08/07/2022   Observation after surgery 08/07/2022   Septic arthritis of shoulder (HCC) 05/07/2022   Anemia of chronic disease 04/10/2022   Lumbar pseudoarthrosis 01/24/2022   Occlusion and stenosis of right carotid artery 01/24/2022   Iron  deficiency anemia secondary to blood loss (chronic) 01/08/2022   B12 deficiency 08/31/2021   Medication monitoring encounter 12/12/2020   Nausea 12/12/2020   Hammer toes of both feet 11/09/2020   Acute maxillary sinusitis 08/08/2020   Hyperlipidemia 08/08/2020   Pain due to onychomycosis of toenails of both feet 08/08/2020   PICC (peripherally inserted central catheter) in place 06/22/2020   Therapeutic drug monitoring 06/22/2020   Wound infection after surgery 05/25/2020   Wound drainage 05/24/2020    Local infection of the skin and subcutaneous tissue, unspecified 05/24/2020   Body mass index (BMI) 27.0-27.9, adult 05/10/2020   S/P lumbar fusion 04/25/2020   Microalbuminuria due to type 2 diabetes mellitus (HCC) 08/07/2019   Primary localized osteoarthritis of right hip 08/22/2018   Carpal tunnel syndrome, bilateral 08/06/2018   Type 2 diabetes mellitus with peripheral neuropathy (HCC) 08/06/2018   Degenerative joint disease of right hip 08/06/2018   Preoperative clearance 03/26/2018   Upper back pain on left side 10/30/2016   Encounter for postoperative carotid endarterectomy surveillance 12/07/2015   Varicosities of leg 06/03/2015   Acute left lumbar radiculopathy 04/15/2014   Chronic sciatica of right side 03/17/2013   Diabetic peripheral neuropathy (HCC) 03/17/2013   Coronary artery disease    Carotid artery stenosis s/p CEA 2013 10/16/2011   Carotid artery occlusion without infarction, right 09/17/2011   Carotid bruit 08/28/2011   Gout 04/13/2011   DM type 2 with diabetic peripheral neuropathy (HCC) 04/02/2008   Hyperlipidemia associated with type 2 diabetes mellitus (HCC) 04/02/2008   Essential hypertension 04/02/2008   DEGENERATIVE DISC DISEASE, LUMBOSACRAL SPINE 04/02/2008    PCP: Shannan Dart., FNP  REFERRING PROVIDER: Saundra Curl, MD  THERAPY DIAG:  Pain in left hip  Muscle weakness  Unsteadiness on feet  REFERRING DIAG:  THA 5/20  Rationale for Evaluation and Treatment:  Rehabilitation  SUBJECTIVE:  PERTINENT PAST HISTORY:  DM II, gout, diabetic peripheral neuropathy (feet), CAD, hx of lumbar surgery, R THA         PRECAUTIONS: Fall  OP Date: 07/16/2023  2 weeks 07/30/2023  4 weeks 08/13/2023  6 weeks 08/27/2023  8 weeks 09/10/2023  10 weeks 09/24/2023  12 weeks 10/08/2023    WEIGHT BEARING RESTRICTIONS No  FALLS:  Has patient fallen in last 6 months? No, Number of falls: 0  MOI/History of condition:  Onset date: 5/20  SUBJECTIVE  STATEMENT:   08/06/2023: Pt reports that she is feeling better and has no real pain but just soreness.   EVAL: April Shaffer is a 73 y.o. female who presents to clinic with chief complaint of L hip pain following L A THA on 5/20.  She reports she is sore but doing well.  She was using a walker and cane before surgery and would like to get back to using only the cane  She has a fairly sedentary lifestyle but her L hip pain got to the point that it was limiting her daily housework and ability to walk in the grocery store.  She has a boyfriend who can help at times but I can do it on my own, I don't want to rely on anyone else. 1 STE house.   Red flags:  denies malaise, erythema / streaking, and chills / night sweats  Pain:  Are you having pain? Yes Pain location: L hip  NPRS scale:  6/10  Aggravating factors: walking, standing, moving around Relieving factors: rest and medication Pain description: sharp, dull, and aching Stage: Acute 24 hour pattern: NA   Occupation: NA  Assistive Device: walker and cane  Hand Dominance: NA  Patient Goals/Specific Activities: improve function of hip, and walk with least restrictive AD   OBJECTIVE:   DIAGNOSTIC FINDINGS:  NA  GENERAL OBSERVATION/GAIT: Slow antalgic gait using FWW  SENSATION: Light touch: Deficits bil feet   LE MMT:  MMT Right (Eval) Left (Eval)  Hip flexion (L2, L3) 3+   Knee extension (L3) 4   Knee flexion 3+   Hip abduction    Hip extension    Hip external rotation 3+   Hip internal rotation 3+   Hip adduction    Ankle dorsiflexion (L4)    Ankle plantarflexion (S1)    Ankle inversion    Ankle eversion    Great Toe ext (L5)    Grossly     (Blank rows = not tested, score listed is out of 5 possible points.  N = WNL, D = diminished, C = clear for gross weakness with myotome testing, * = concordant pain with testing)  LE ROM:  ROM Right (Eval) Left (Eval)  Hip flexion 110 90*  Hip extension     Hip abduction    Hip adduction    Hip internal rotation    Hip external rotation    Knee extension    Knee flexion    Ankle dorsiflexion    Ankle plantarflexion    Ankle inversion    Ankle eversion     (Blank rows = not tested, N = WNL, * = concordant pain with testing)  Functional Tests  Eval    30'' STS: 6x  UE used? Y    10 m max gait speed: 23'', .43  m/s, AD: FWW                                                      PATIENT SURVEYS:  LEFS: 31/80   TODAY'S TREATMENT: [facility] Adult PT Treatment:                                                DATE: 08/06/23  Therex:   Nustep L6 UE/LE x 6 minutes  Heel slide AROM with concentration on getting neutral ext - 2x15 Supine bridge - small arc - 2x10 LTR   LAQ 4# alternating - 3x10 ea  Therapeutic Activity  Heel raise - 2x15 - cues for upright posture Lateral walking - w/ UE support Fwd and retro walking in // with no UE support Squat with UE support tap to chair with 1 airex - 4x5 Walking march in // with UE support   [facility] Adult PT Treatment:                                                DATE: 07/30/23  Nustep L3 x 7 min Standing heel raises LAQ 2# alternating 2 x 20  Heel slide Left on slide board , left  QS into Towel roll left  SAQ Left Ball squeeze HL Supine hp abduction iso bilateral with belt- legs over pillows HL clam with belt isometric Bridge  x 10 Manual STW to left anterior / medial thigh  Ice pack left anterior hip     [facility] Adult PT Treatment  07/23/2023: Nustep L 3 UE/LE x 5 min  Standing with upright posture at RW  Mini squats at RW Heel raises at RW  Seated heel slides AA     LAQ - 2# - 2x10 LTR - 20x ea Heel slide with disc Supine clam - isometric into belt  Hip adduction squeeze - 5'' 2 x 10 HL gluteal squeeze 5 sec x 10  Ice pack left hip x 8 min  [facility] Adult PT Treatment:                                                DATE: 07/25/23 Therapeutic Exercise: nu-step L5 60m  while taking subjective and planning session with patient LTR - 20x ea Heel slide with strap Supine clam - RTB - 2x10 PROM hip flexion  - pt in supine Hip adduction squeeze - 5'' 2x10 HL march - 2x20 Hip flexor stretch to neutral LAQ - 2# - 2x10     HOME EXERCISE PROGRAM: Access Code: [email or web address] URL: [email or web address] Date: 07/19/2023 Prepared by: Lesleigh Rash  Exercises - Hooklying Clamshell with Resistance  - 1 x daily - 7 x weekly - 3 sets - 10 reps - Supine Hip Adduction Isometric with Ball  - 1 x daily - 7 x weekly - 2 sets - 10 reps - 10'' hold - Supine Heel Slide with Strap  - 3 x daily -  7 x weekly - 2 sets - 10 reps - Seated Long Arc Quad  - 1 x daily - 7 x weekly - 3 sets - 10 reps  Treatment priorities   5/27 6/10       Hip ext ROM Hip ext during gait        CC strengthening        balance        gait                 ASSESSMENT:  CLINICAL IMPRESSION:  08/06/2023:  April Shaffer tolerated session well with no adverse reaction.  Progressed to more closed chain strengthening in w/b.  Pt reports fatigue and tightness with no increase in pain.  Will concentrate on regaining hip ext ROM to allow for upright posture in gait in short term.  Pt has nearly regained hip ext to neutral. In supine.  EVAL: April Shaffer is a 73 y.o. female who presents to clinic with signs and sxs consistent with L hip pain and stiffness following direct anterior THA on 5/20.  Appears to be progressing as expected given time since surgery.  Provided HEP for some gentle hip exercises and ROM with instruction to hold clam at comfortable level with knees only slightly apart.  April Shaffer will benefit from skilled PT to address relevant deficits to improve safety, comfort, and independence with daily activities and mobility.    OBJECTIVE IMPAIRMENTS: Pain, hip ROM, LE and hip strength, balance, gait  ACTIVITY LIMITATIONS: standing, walking, transfers, bending, squatting, stairs, self care,  community ambulation  PERSONAL FACTORS: April medical history and pertinent history   REHAB POTENTIAL: Good  CLINICAL DECISION MAKING: Evolving/moderate complexity  EVALUATION COMPLEXITY: Moderate   GOALS:   SHORT TERM GOALS: Target date: 08/16/2023   April Shaffer will be >75% HEP compliant to improve carryover between sessions and facilitate independent management of condition  Evaluation: ongoing Goal status: INITIAL   LONG TERM GOALS: Target date: 09/13/2023   April Shaffer will self report >/= 50% decrease in pain from evaluation to improve function in daily tasks  Evaluation/Baseline: 9/10 max pain Goal status: INITIAL   2.  April Shaffer will show a >/= 25 pt improvement in LEFS score (MCID is ~11% or 9 pts) as a proxy for functional improvement   Evaluation/Baseline: 31 pts Goal status: INITIAL   3.  April Shaffer will improve 30'' STS (MCID 2) to >/= 8x (w/ UE?: n) to show improved LE strength and improved transfers   Evaluation/Baseline: 6x  w/ UE? y Goal status: INITIAL   4.  April Shaffer will improve 10 meter max gait speed to .8 m/s (.1 m/s MCID) to show functional improvement in ambulation   Evaluation/Baseline: .43 m/s w/ FWW Goal status: INITIAL   [place]:     5.  Jameka will improve the following MMTs to >/= 4/5 to show improvement in strength:     Evaluation/Baseline:  LE MMT:  MMT Right (Eval) Left (Eval)  Hip flexion (L2, L3) 3+   Knee extension (L3) 4   Knee flexion 3+   Hip abduction    Hip extension    Hip external rotation 3+   Hip internal rotation 3+   Hip adduction    Ankle dorsiflexion (L4)    Ankle plantarflexion (S1)    Ankle inversion    Ankle eversion    Great Toe ext (L5)    Grossly     (Blank rows = not tested, score listed is out of 5 possible points.  N =  WNL, D = diminished, C = clear for gross weakness with myotome testing, * = concordant pain with testing)  Goal status: INITIAL   6.  April Shaffer will be able to complete all need  housework and shop with LRAD with confidence and not limited by pain  Evaluation/Baseline: limited Goal status: INITIAL   PLAN: PT FREQUENCY: 1-2x/week  PT DURATION: 8 weeks  PLANNED INTERVENTIONS:  97164- PT Re-evaluation, 97110-Therapeutic exercises, 97530- Therapeutic activity, [id number]- Neuromuscular re-education, 97535- Self Care, [id number]- Manual therapy, [id number]- Gait training, [id number]- Aquatic Therapy, [phone]- Electrical stimulation (manual), 97016- Vasopneumatic device, Taping, Dry Needling, Joint manipulation, and Spinal manipulation.  April Shaffer PT 08/06/23 11:01 AM Phone: [phone] Fax: [phone]   Referring diagnosis?    What was this (referring dx) caused by? [x]  Surgery []  Fall []  Ongoing issue []  Arthritis []  Other: ____________  Laterality: []  Rt [x]  Lt []  Both  Check all possible CPT codes:  *CHOOSE 10 OR LESS*    April Planned Interventions listed in the Plan section of the Evaluation.

## 2023-08-08 ENCOUNTER — Ambulatory Visit: Admitting: Physical Therapy

## 2023-08-08 ENCOUNTER — Encounter: Payer: Self-pay | Admitting: Physical Therapy

## 2023-08-08 DIAGNOSIS — M25552 Pain in left hip: Secondary | ICD-10-CM

## 2023-08-08 DIAGNOSIS — M6281 Muscle weakness (generalized): Secondary | ICD-10-CM

## 2023-08-08 DIAGNOSIS — R2681 Unsteadiness on feet: Secondary | ICD-10-CM

## 2023-08-08 NOTE — Therapy (Addendum)
 OUTPATIENT PHYSICAL THERAPY DAILY NOTE  Patient Name: April Shaffer MRN: [id number] DOB:Oct 16, 1950, 73 y.o., female Today's Date: 08/08/2023   PT End of Session - 08/08/23 1057     Visit Number 7    Number of Visits --   1-2 x per week   Date for PT Re-Evaluation 09/13/23    Authorization Type [employer] MCR - LEFS    Authorization Time Period 07/19/23-08/31/23    Authorization - Visit Number 7    Authorization - Number of Visits 12    Progress Note Due on Visit 10    PT Start Time 1100    PT Stop Time 1141    PT Time Calculation (min) 41 min          Past Medical History:  Diagnosis Date   Anemia    Arthritis    Blood transfusion without reported diagnosis    Carotid artery narrowing    Coronary artery disease    Cardiac catheterization November 2013: 50% ostial LAD stenosis 50% mid stenosis. 30% disease in the left circumflex.   Diabetes mellitus, type 2 (HCC)    Hyperlipidemia    Hypertension    Onychomycosis of toenail 07/31/2016   PAD (peripheral artery disease) (HCC)    Sleep apnea       Had surgery to correct   Thyroid  nodule    Past Surgical History:  Procedure Laterality Date   ABDOMINAL AORTOGRAM W/LOWER EXTREMITY N/A 08/07/2021   Procedure: ABDOMINAL AORTOGRAM W/LOWER EXTREMITY;  Surgeon: Adine Hoof, MD;  Location: [facility] INVASIVE CV LAB;  Service: Cardiovascular;  Laterality: N/A;   ABDOMINAL AORTOGRAM W/LOWER EXTREMITY Right 10/23/2021   Procedure: ABDOMINAL AORTOGRAM W/LOWER EXTREMITY;  Surgeon: Adine Hoof, MD;  Location: [facility] INVASIVE CV LAB;  Service: Cardiovascular;  Laterality: Right;   ABDOMINAL HYSTERECTOMY     APPLICATION OF INTRAOPERATIVE CT SCAN N/A 11/30/2020   Procedure: APPLICATION OF INTRAOPERATIVE CT SCAN;  Surgeon: Isadora Mar, MD;  Location: [facility] OR;  Service: Neurosurgery;  Laterality: N/A;   BACK SURGERY     CARDIAC CATHETERIZATION     CATARACT EXTRACTION, BILATERAL Bilateral 2021   Dr. Brooks Cao   ENDARTERECTOMY   09/27/2011   Procedure: RIGT ENDARTERECTOMY CAROTID;  Surgeon: Palma Bob, MD;  Location: [facility] OR;  Service: Vascular;  Laterality: Right;   EPIDURAL BLOCK INJECTION  02/2008   Drs. Maxwell Spark   EYE SURGERY Bilateral 2021   cataract   gyn surgery  2004   total hysterectomy for mennorhagia,,salpingoophorectomy   INCISION AND DRAINAGE ABSCESS Left 05/08/2022   Procedure: ARTHROSCOPIC INCISION AND DRAINAGE ABSCESS, DISTAL CLAVICLE EXCISSION, SUBACROMIAL DECOMPRESSION, LOOSE BODY REMOVAL, EXTENSIVE DEBRIDEMENT;  Surgeon: Saundra Curl, MD;  Location: [facility] ORS;  Service: Orthopedics;  Laterality: Left;   LAMINECTOMY WITH POSTERIOR LATERAL ARTHRODESIS LEVEL 2 N/A 11/30/2020   Procedure: LUMBAR FOUR-FIVE, LUMBAR FIVE-SACRAL ONE POSTERIOR LATERAL FUSION WITH REVISION OF LUMBAR ONE-FIVE HARDWARE AND EXTENSION TO SACRAL ONE AND SACRAL TWO;  Surgeon: Isadora Mar, MD;  Location: [facility] OR;  Service: Neurosurgery;  Laterality: N/A;   LUMBAR WOUND DEBRIDEMENT N/A 05/25/2020   Procedure: LUMBAR WOUND IRRIGATION AND DEBRIDEMENT;  Surgeon: Isadora Mar, MD;  Location: [facility] OR;  Service: Neurosurgery;  Laterality: N/A;   PERIPHERAL VASCULAR INTERVENTION  08/07/2021   Procedure: PERIPHERAL VASCULAR INTERVENTION;  Surgeon: Adine Hoof, MD;  Location: [facility] INVASIVE CV LAB;  Service: Cardiovascular;;  Rt SFA   PERIPHERAL VASCULAR INTERVENTION Right 10/23/2021   Procedure: PERIPHERAL VASCULAR INTERVENTION;  Surgeon: Adine Hoof, MD;  Location: [facility] INVASIVE CV LAB;  Service: Cardiovascular;  Laterality: Right;  SFA   POSTERIOR LUMBAR FUSION 4 LEVEL N/A 04/25/2020   Procedure: POSTERIOR LUMBAR INTERBODY FUSION LUMBAR ONE-TWO, LUMBAR TWO-THREE, LUMBAR THREE-FOUR,LUMBAR FOUR-FIVE.;  Surgeon: Isadora Mar, MD;  Location: [facility] OR;  Service: Neurosurgery;  Laterality: N/A;  posterior   SHOULDER ARTHROSCOPY Left 05/08/2022   Procedure: ARTHROSCOPY SHOULDER;  Surgeon: Saundra Curl,  MD;  Location: [facility] ORS;  Service: Orthopedics;  Laterality: Left;   TONSILLECTOMY     TOTAL ABDOMINAL HYSTERECTOMY W/ BILATERAL SALPINGOOPHORECTOMY Bilateral 2000   TOTAL HIP ARTHROPLASTY Right 08/22/2018   Procedure: TOTAL HIP ARTHROPLASTY;  Surgeon: Marlena Sima, MD;  Location: [facility] OR[facility];  Service: Orthopedics;  Laterality: Right;   TOTAL HIP ARTHROPLASTY Left 07/16/2023   Procedure: ARTHROPLASTY, HIP, TOTAL, ANTERIOR APPROACH;  Surgeon: Saundra Curl, MD;  Location: [facility] ORS;  Service: Orthopedics;  Laterality: Left;   TRANSCAROTID ARTERY REVASCULARIZATION  Left 08/07/2022   Procedure: Transcarotid Artery Revascularization;  Surgeon: Adine Hoof, MD;  Location: [facility] OR;  Service: Vascular;  Laterality: Left;   ULTRASOUND GUIDANCE FOR VASCULAR ACCESS Left 08/07/2022   Procedure: ULTRASOUND GUIDANCE FOR VASCULAR ACCESS;  Surgeon: Adine Hoof, MD;  Location: [facility] OR;  Service: Vascular;  Laterality: Left;   VARICOSE VEIN SURGERY  2008   stripping   Patient Active Problem List   Diagnosis Date Noted   S/P total left hip arthroplasty 07/16/2023   Asymptomatic carotid artery stenosis without infarction, left 08/07/2022   Observation after surgery 08/07/2022   Septic arthritis of shoulder (HCC) 05/07/2022   Anemia of chronic disease 04/10/2022   Lumbar pseudoarthrosis 01/24/2022   Occlusion and stenosis of right carotid artery 01/24/2022   Iron  deficiency anemia secondary to blood loss (chronic) 01/08/2022   B12 deficiency 08/31/2021   Medication monitoring encounter 12/12/2020   Nausea 12/12/2020   Hammer toes of both feet 11/09/2020   Acute maxillary sinusitis 08/08/2020   Hyperlipidemia 08/08/2020   Pain due to onychomycosis of toenails of both feet 08/08/2020   PICC (peripherally inserted central catheter) in place 06/22/2020   Therapeutic drug monitoring 06/22/2020   Wound infection after surgery 05/25/2020   Wound drainage 05/24/2020   Local infection of  the skin and subcutaneous tissue, unspecified 05/24/2020   Body mass index (BMI) 27.0-27.9, adult 05/10/2020   S/P lumbar fusion 04/25/2020   Microalbuminuria due to type 2 diabetes mellitus (HCC) 08/07/2019   Primary localized osteoarthritis of right hip 08/22/2018   Carpal tunnel syndrome, bilateral 08/06/2018   Type 2 diabetes mellitus with peripheral neuropathy (HCC) 08/06/2018   Degenerative joint disease of right hip 08/06/2018   Preoperative clearance 03/26/2018   Upper back pain on left side 10/30/2016   Encounter for postoperative carotid endarterectomy surveillance 12/07/2015   Varicosities of leg 06/03/2015   Acute left lumbar radiculopathy 04/15/2014   Chronic sciatica of right side 03/17/2013   Diabetic peripheral neuropathy (HCC) 03/17/2013   Coronary artery disease    Carotid artery stenosis s/p CEA 2013 10/16/2011   Carotid artery occlusion without infarction, right 09/17/2011   Carotid bruit 08/28/2011   Gout 04/13/2011   DM type 2 with diabetic peripheral neuropathy (HCC) 04/02/2008   Hyperlipidemia associated with type 2 diabetes mellitus (HCC) 04/02/2008   Essential hypertension 04/02/2008   DEGENERATIVE DISC DISEASE, LUMBOSACRAL SPINE 04/02/2008    PCP: Shannan Dart., FNP  REFERRING PROVIDER: Shannan Dart., FNP  THERAPY DIAG:  Pain  in left hip  Muscle weakness  Unsteadiness on feet  REFERRING DIAG:  THA 5/20  Rationale for Evaluation and Treatment:  Rehabilitation  SUBJECTIVE:  PERTINENT PAST HISTORY:  DM II, gout, diabetic peripheral neuropathy (feet), CAD, hx of lumbar surgery, R THA         PRECAUTIONS: Fall  OP Date: 07/16/2023  2 weeks 07/30/2023  4 weeks 08/13/2023  6 weeks 08/27/2023  8 weeks 09/10/2023  10 weeks 09/24/2023  12 weeks 10/08/2023    WEIGHT BEARING RESTRICTIONS No  FALLS:  Has patient fallen in last 6 months? No, Number of falls: 0  MOI/History of condition:  Onset date: 5/20  SUBJECTIVE STATEMENT:    08/08/2023: Pt report that she was tired after last visit but had no increase in pain.  She has been walking with SPC occasionally at home.   EVAL: KAELAN EMAMI is a 73 y.o. female who presents to clinic with chief complaint of L hip pain following L A THA on 5/20.  She reports she is sore but doing well.  She was using a walker and cane before surgery and would like to get back to using only the cane  She has a fairly sedentary lifestyle but her L hip pain got to the point that it was limiting her daily housework and ability to walk in the grocery store.  She has a boyfriend who can help at times but I can do it on my own, I don't want to rely on anyone else. 1 STE house.   Red flags:  denies malaise, erythema / streaking, and chills / night sweats  Pain:  Are you having pain? Yes Pain location: L hip  NPRS scale:  6/10  Aggravating factors: walking, standing, moving around Relieving factors: rest and medication Pain description: sharp, dull, and aching Stage: Acute 24 hour pattern: NA   Occupation: NA  Assistive Device: walker and cane  Hand Dominance: NA  Patient Goals/Specific Activities: improve function of hip, and walk with least restrictive AD   OBJECTIVE:   DIAGNOSTIC FINDINGS:  NA  GENERAL OBSERVATION/GAIT: Slow antalgic gait using FWW  SENSATION: Light touch: Deficits bil feet   LE MMT:  MMT Right (Eval) Left (Eval)  Hip flexion (L2, L3) 3+   Knee extension (L3) 4   Knee flexion 3+   Hip abduction    Hip extension    Hip external rotation 3+   Hip internal rotation 3+   Hip adduction    Ankle dorsiflexion (L4)    Ankle plantarflexion (S1)    Ankle inversion    Ankle eversion    Great Toe ext (L5)    Grossly     (Blank rows = not tested, score listed is out of 5 possible points.  N = WNL, D = diminished, C = clear for gross weakness with myotome testing, * = concordant pain with testing)  LE ROM:  ROM Right (Eval) Left (Eval) L  Hip  flexion 110 90* 103  Hip extension     Hip abduction     Hip adduction     Hip internal rotation     Hip external rotation     Knee extension     Knee flexion     Ankle dorsiflexion     Ankle plantarflexion     Ankle inversion     Ankle eversion      (Blank rows = not tested, N = WNL, * = concordant pain with testing)  Functional Tests  Eval 6/12   30'' STS: 6x  UE used? Y 8x w/ UE   10 m max gait speed: 23'', .43 m/s, AD: FWW                                                      PATIENT SURVEYS:  LEFS: 31/80   TODAY'S TREATMENT: [facility] Adult PT Treatment:                                                DATE: 08/08/23  Therex:   Bike 5 min Modified thomas stretch - 1' x2 Gentle passive hip flexion to ~100 degrees Supine bridge - small arc - 3x8   LAQ 4# alternating - 3x8 ea  Therapeutic Activity  Lateral walking - w/ no UE support in // Fwd and retro walking in // with no UE support Hurdle step over 2x5 ea w/ UE support in //  Neuromuscular re-ed: Semi tandem (100%) L in rear - 30'' bouts  Gait training:  SPC training with min cues for sequencing and step length - 185'   [facility] Adult PT Treatment:                                                DATE: 07/30/23  Nustep L3 x 7 min Standing heel raises LAQ 2# alternating 2 x 20  Heel slide Left on slide board , left  QS into Towel roll left  SAQ Left Ball squeeze HL Supine hp abduction iso bilateral with belt- legs over pillows HL clam with belt isometric Bridge  x 10 Manual STW to left anterior / medial thigh  Ice pack left anterior hip     [facility] Adult PT Treatment  07/23/2023: Nustep L 3 UE/LE x 5 min  Standing with upright posture at RW  Mini squats at RW Heel raises at RW  Seated heel slides AA     LAQ - 2# - 2x10 LTR - 20x ea Heel slide with disc Supine clam - isometric into belt  Hip adduction squeeze - 5'' 2 x 10 HL gluteal squeeze 5 sec x 10  Ice pack left hip x 8 min  [facility] Adult  PT Treatment:                                                DATE: 07/25/23 Therapeutic Exercise: nu-step L5 51m while taking subjective and planning session with patient LTR - 20x ea Heel slide with strap Supine clam - RTB - 2x10 PROM hip flexion  - pt in supine Hip adduction squeeze - 5'' 2x10 HL march - 2x20 Hip flexor stretch to neutral LAQ - 2# - 2x10     HOME EXERCISE PROGRAM: Access Code: [email or web address] URL: [email or web address] Date: 07/19/2023 Prepared by: Lesleigh Rash  Exercises - Hooklying Clamshell with Resistance  - 1 x daily - 7 x weekly - 3 sets - 10 reps - Supine Hip Adduction  Isometric with Ball  - 1 x daily - 7 x weekly - 2 sets - 10 reps - 10'' hold - Supine Heel Slide with Strap  - 3 x daily - 7 x weekly - 2 sets - 10 reps - Seated Long Arc Quad  - 1 x daily - 7 x weekly - 3 sets - 10 reps  Treatment priorities   5/27 6/10       Hip ext ROM Hip ext during gait        CC strengthening        balance        gait                 ASSESSMENT:  CLINICAL IMPRESSION:  08/08/2023:  Adah Acron tolerated session well with no adverse reaction.  Working on progressing standing tolerance to good effect.  Included walking with [facility] today with min cues provided for sequencing initially but pt able to complete independently after instruction.  She continues to demonstrate forward flexed posture in standing which increases with fatigue during ambulation tasks.  Hip flexion and ext ROM improving but hip ext remains significantly limited.  30'' STS improved by 2 reps from eval.  EVAL: Toshika is a 73 y.o. female who presents to clinic with signs and sxs consistent with L hip pain and stiffness following direct anterior THA on 5/20.  Appears to be progressing as expected given time since surgery.  Provided HEP for some gentle hip exercises and ROM with instruction to hold clam at comfortable level with knees only slightly apart.  Maria will benefit from skilled PT to  address relevant deficits to improve safety, comfort, and independence with daily activities and mobility.    OBJECTIVE IMPAIRMENTS: Pain, hip ROM, LE and hip strength, balance, gait  ACTIVITY LIMITATIONS: standing, walking, transfers, bending, squatting, stairs, self care, community ambulation  PERSONAL FACTORS: See medical history and pertinent history   REHAB POTENTIAL: Good  CLINICAL DECISION MAKING: Evolving/moderate complexity  EVALUATION COMPLEXITY: Moderate   GOALS:   SHORT TERM GOALS: Target date: 08/16/2023   Leanette will be >75% HEP compliant to improve carryover between sessions and facilitate independent management of condition  Evaluation: ongoing Goal status: INITIAL   LONG TERM GOALS: Target date: 09/13/2023   Ermalee will self report >/= 50% decrease in pain from evaluation to improve function in daily tasks  Evaluation/Baseline: 9/10 max pain Goal status: INITIAL   2.  Bera will show a >/= 25 pt improvement in LEFS score (MCID is ~11% or 9 pts) as a proxy for functional improvement   Evaluation/Baseline: 31 pts Goal status: INITIAL   3.  Krystl will improve 30'' STS (MCID 2) to >/= 8x (w/ UE?: n) to show improved LE strength and improved transfers   Evaluation/Baseline: 6x  w/ UE? y Goal status: INITIAL   4.  Argelia will improve 10 meter max gait speed to .8 m/s (.1 m/s MCID) to show functional improvement in ambulation   Evaluation/Baseline: .43 m/s w/ FWW Goal status: INITIAL   Norms:     5.  Havanah will improve the following MMTs to >/= 4/5 to show improvement in strength:     Evaluation/Baseline:  LE MMT:  MMT Right (Eval) Left (Eval)  Hip flexion (L2, L3) 3+   Knee extension (L3) 4   Knee flexion 3+   Hip abduction    Hip extension    Hip external rotation 3+   Hip internal rotation 3+   Hip adduction  Ankle dorsiflexion (L4)    Ankle plantarflexion (S1)    Ankle inversion    Ankle eversion    Great Toe ext  (L5)    Grossly     (Blank rows = not tested, score listed is out of 5 possible points.  N = WNL, D = diminished, C = clear for gross weakness with myotome testing, * = concordant pain with testing)  Goal status: INITIAL   6.  Kadince will be able to complete all need housework and shop with LRAD with confidence and not limited by pain  Evaluation/Baseline: limited Goal status: INITIAL   PLAN: PT FREQUENCY: 1-2x/week  PT DURATION: 8 weeks  PLANNED INTERVENTIONS:  97164- PT Re-evaluation, 97110-Therapeutic exercises, 97530- Therapeutic activity, [id number]- Neuromuscular re-education, 97535- Self Care, 52841- Manual therapy, [id number]- Gait training, [id number]- Aquatic Therapy, [phone]- Electrical stimulation (manual), 97016- Vasopneumatic device, Taping, Dry Needling, Joint manipulation, and Spinal manipulation.  Donovan Gallant Rhyli Depaula PT 08/08/23 11:51 AM Phone: [phone] Fax: [phone]   Referring diagnosis?    What was this (referring dx) caused by? [x]  Surgery []  Fall []  Ongoing issue []  Arthritis []  Other: ____________  Laterality: []  Rt [x]  Lt []  Both  Check all possible CPT codes:  *CHOOSE 10 OR LESS*    See Planned Interventions listed in the Plan section of the Evaluation.

## 2023-08-14 ENCOUNTER — Encounter: Payer: Self-pay | Admitting: Physical Therapy

## 2023-08-14 ENCOUNTER — Inpatient Hospital Stay: Payer: Medicare HMO | Attending: Nurse Practitioner

## 2023-08-14 ENCOUNTER — Ambulatory Visit: Admitting: Physical Therapy

## 2023-08-14 DIAGNOSIS — M25552 Pain in left hip: Secondary | ICD-10-CM

## 2023-08-14 DIAGNOSIS — M6281 Muscle weakness (generalized): Secondary | ICD-10-CM

## 2023-08-14 DIAGNOSIS — R2681 Unsteadiness on feet: Secondary | ICD-10-CM

## 2023-08-14 DIAGNOSIS — E538 Deficiency of other specified B group vitamins: Secondary | ICD-10-CM | POA: Insufficient documentation

## 2023-08-14 DIAGNOSIS — D508 Other iron deficiency anemias: Secondary | ICD-10-CM | POA: Insufficient documentation

## 2023-08-14 NOTE — Therapy (Signed)
 OUTPATIENT PHYSICAL THERAPY DAILY NOTE  Patient Name: April Shaffer MRN: [id number] DOB:04-05-1950, 73 y.o., female Today's Date: 08/14/2023   PT End of Session - 08/14/23 1059     Visit Number 8    Date for PT Re-Evaluation 09/13/23    Authorization Type [employer] MCR - LEFS    Authorization Time Period 07/19/23-08/31/23    Authorization - Visit Number 8    Authorization - Number of Visits 12    Progress Note Due on Visit 10    PT Start Time 1100    PT Stop Time 1152    PT Time Calculation (min) 52 min    Activity Tolerance Patient tolerated treatment well    Behavior During Therapy [facility] for tasks assessed/performed           Past Medical History:  Diagnosis Date   Anemia    Arthritis    Blood transfusion without reported diagnosis    Carotid artery narrowing    Coronary artery disease    Cardiac catheterization November 2013: 50% ostial LAD stenosis 50% mid stenosis. 30% disease in the left circumflex.   Diabetes mellitus, type 2 (HCC)    Hyperlipidemia    Hypertension    Onychomycosis of toenail 07/31/2016   PAD (peripheral artery disease) (HCC)    Sleep apnea       Had surgery to correct   Thyroid  nodule    Past Surgical History:  Procedure Laterality Date   ABDOMINAL AORTOGRAM W/LOWER EXTREMITY N/A 08/07/2021   Procedure: ABDOMINAL AORTOGRAM W/LOWER EXTREMITY;  Surgeon: Adine Hoof, MD;  Location: [facility] INVASIVE CV LAB;  Service: Cardiovascular;  Laterality: N/A;   ABDOMINAL AORTOGRAM W/LOWER EXTREMITY Right 10/23/2021   Procedure: ABDOMINAL AORTOGRAM W/LOWER EXTREMITY;  Surgeon: Adine Hoof, MD;  Location: [facility] INVASIVE CV LAB;  Service: Cardiovascular;  Laterality: Right;   ABDOMINAL HYSTERECTOMY     APPLICATION OF INTRAOPERATIVE CT SCAN N/A 11/30/2020   Procedure: APPLICATION OF INTRAOPERATIVE CT SCAN;  Surgeon: Isadora Mar, MD;  Location: [facility] OR;  Service: Neurosurgery;  Laterality: N/A;   BACK SURGERY     CARDIAC CATHETERIZATION      CATARACT EXTRACTION, BILATERAL Bilateral 2021   Dr. Brooks Cao   ENDARTERECTOMY  09/27/2011   Procedure: RIGT ENDARTERECTOMY CAROTID;  Surgeon: Palma Bob, MD;  Location: [facility] OR;  Service: Vascular;  Laterality: Right;   EPIDURAL BLOCK INJECTION  02/2008   Drs. Maxwell Spark   EYE SURGERY Bilateral 2021   cataract   gyn surgery  2004   total hysterectomy for mennorhagia,,salpingoophorectomy   INCISION AND DRAINAGE ABSCESS Left 05/08/2022   Procedure: ARTHROSCOPIC INCISION AND DRAINAGE ABSCESS, DISTAL CLAVICLE EXCISSION, SUBACROMIAL DECOMPRESSION, LOOSE BODY REMOVAL, EXTENSIVE DEBRIDEMENT;  Surgeon: Saundra Curl, MD;  Location: [facility] ORS;  Service: Orthopedics;  Laterality: Left;   LAMINECTOMY WITH POSTERIOR LATERAL ARTHRODESIS LEVEL 2 N/A 11/30/2020   Procedure: LUMBAR FOUR-FIVE, LUMBAR FIVE-SACRAL ONE POSTERIOR LATERAL FUSION WITH REVISION OF LUMBAR ONE-FIVE HARDWARE AND EXTENSION TO SACRAL ONE AND SACRAL TWO;  Surgeon: Isadora Mar, MD;  Location: [facility] OR;  Service: Neurosurgery;  Laterality: N/A;   LUMBAR WOUND DEBRIDEMENT N/A 05/25/2020   Procedure: LUMBAR WOUND IRRIGATION AND DEBRIDEMENT;  Surgeon: Isadora Mar, MD;  Location: [facility] OR;  Service: Neurosurgery;  Laterality: N/A;   PERIPHERAL VASCULAR INTERVENTION  08/07/2021   Procedure: PERIPHERAL VASCULAR INTERVENTION;  Surgeon: Adine Hoof, MD;  Location: [facility] INVASIVE CV LAB;  Service: Cardiovascular;;  Rt SFA   PERIPHERAL VASCULAR INTERVENTION Right  10/23/2021   Procedure: PERIPHERAL VASCULAR INTERVENTION;  Surgeon: Adine Hoof, MD;  Location: [facility] INVASIVE CV LAB;  Service: Cardiovascular;  Laterality: Right;  SFA   POSTERIOR LUMBAR FUSION 4 LEVEL N/A 04/25/2020   Procedure: POSTERIOR LUMBAR INTERBODY FUSION LUMBAR ONE-TWO, LUMBAR TWO-THREE, LUMBAR THREE-FOUR,LUMBAR FOUR-FIVE.;  Surgeon: Isadora Mar, MD;  Location: [facility] OR;  Service: Neurosurgery;  Laterality: N/A;  posterior   SHOULDER ARTHROSCOPY Left  05/08/2022   Procedure: ARTHROSCOPY SHOULDER;  Surgeon: Saundra Curl, MD;  Location: [facility] ORS;  Service: Orthopedics;  Laterality: Left;   TONSILLECTOMY     TOTAL ABDOMINAL HYSTERECTOMY W/ BILATERAL SALPINGOOPHORECTOMY Bilateral 2000   TOTAL HIP ARTHROPLASTY Right 08/22/2018   Procedure: TOTAL HIP ARTHROPLASTY;  Surgeon: Marlena Sima, MD;  Location: [facility] ORS;  Service: Orthopedics;  Laterality: Right;   TOTAL HIP ARTHROPLASTY Left 07/16/2023   Procedure: ARTHROPLASTY, HIP, TOTAL, ANTERIOR APPROACH;  Surgeon: Saundra Curl, MD;  Location: [facility] ORS;  Service: Orthopedics;  Laterality: Left;   TRANSCAROTID ARTERY REVASCULARIZATION  Left 08/07/2022   Procedure: Transcarotid Artery Revascularization;  Surgeon: Adine Hoof, MD;  Location: [facility] OR;  Service: Vascular;  Laterality: Left;   ULTRASOUND GUIDANCE FOR VASCULAR ACCESS Left 08/07/2022   Procedure: ULTRASOUND GUIDANCE FOR VASCULAR ACCESS;  Surgeon: Adine Hoof, MD;  Location: [facility] OR;  Service: Vascular;  Laterality: Left;   VARICOSE VEIN SURGERY  2008   stripping   Patient Active Problem List   Diagnosis Date Noted   S/P total left hip arthroplasty 07/16/2023   Asymptomatic carotid artery stenosis without infarction, left 08/07/2022   Observation after surgery 08/07/2022   Septic arthritis of shoulder (HCC) 05/07/2022   Anemia of chronic disease 04/10/2022   Lumbar pseudoarthrosis 01/24/2022   Occlusion and stenosis of right carotid artery 01/24/2022   Iron  deficiency anemia secondary to blood loss (chronic) 01/08/2022   B12 deficiency 08/31/2021   Medication monitoring encounter 12/12/2020   Nausea 12/12/2020   Hammer toes of both feet 11/09/2020   Acute maxillary sinusitis 08/08/2020   Hyperlipidemia 08/08/2020   Pain due to onychomycosis of toenails of both feet 08/08/2020   PICC (peripherally inserted central catheter) in place 06/22/2020   Therapeutic drug monitoring 06/22/2020   Wound infection  after surgery 05/25/2020   Wound drainage 05/24/2020   Local infection of the skin and subcutaneous tissue, unspecified 05/24/2020   Body mass index (BMI) 27.0-27.9, adult 05/10/2020   S/P lumbar fusion 04/25/2020   Microalbuminuria due to type 2 diabetes mellitus (HCC) 08/07/2019   Primary localized osteoarthritis of right hip 08/22/2018   Carpal tunnel syndrome, bilateral 08/06/2018   Type 2 diabetes mellitus with peripheral neuropathy (HCC) 08/06/2018   Degenerative joint disease of right hip 08/06/2018   Preoperative clearance 03/26/2018   Upper back pain on left side 10/30/2016   Encounter for postoperative carotid endarterectomy surveillance 12/07/2015   Varicosities of leg 06/03/2015   Acute left lumbar radiculopathy 04/15/2014   Chronic sciatica of right side 03/17/2013   Diabetic peripheral neuropathy (HCC) 03/17/2013   Coronary artery disease    Carotid artery stenosis s/p CEA 2013 10/16/2011   Carotid artery occlusion without infarction, right 09/17/2011   Carotid bruit 08/28/2011   Gout 04/13/2011   DM type 2 with diabetic peripheral neuropathy (HCC) 04/02/2008   Hyperlipidemia associated with type 2 diabetes mellitus (HCC) 04/02/2008   Essential hypertension 04/02/2008   DEGENERATIVE DISC DISEASE, LUMBOSACRAL SPINE 04/02/2008    PCP: Shannan Dart., FNP  REFERRING PROVIDER: Gaetana Jones  Liborio Reeds., FNP  THERAPY DIAG:  No diagnosis found.  REFERRING DIAG:  THA 5/20  Rationale for Evaluation and Treatment:  Rehabilitation  SUBJECTIVE:  PERTINENT PAST HISTORY:  DM II, gout, diabetic peripheral neuropathy (feet), CAD, hx of lumbar surgery, R THA         PRECAUTIONS: Fall  OP Date: 07/16/2023  2 weeks 07/30/2023  4 weeks 08/13/2023  6 weeks 08/27/2023  8 weeks 09/10/2023  10 weeks 09/24/2023  12 weeks 10/08/2023    WEIGHT BEARING RESTRICTIONS No  FALLS:  Has patient fallen in last 6 months? No, Number of falls: 0  MOI/History of condition:  Onset date:  5/20  SUBJECTIVE STATEMENT:   08/14/2023: some soreness in the top of the thighs from working out.  EVAL: VENDELA TROUNG is a 73 y.o. female who presents to clinic with chief complaint of L hip pain following L A THA on 5/20.  She reports she is sore but doing well.  She was using a walker and cane before surgery and would like to get back to using only the cane  She has a fairly sedentary lifestyle but her L hip pain got to the point that it was limiting her daily housework and ability to walk in the grocery store.  She has a boyfriend who can help at times but I can do it on my own, I don't want to rely on anyone else. 1 STE house.   Red flags:  denies malaise, erythema / streaking, and chills / night sweats  Pain:  Are you having pain? Yes Pain location: L hip  NPRS scale:  0/10 (last took pain pill at 10 am)  Aggravating factors: walking, standing, moving around Relieving factors: rest and medication Pain description: sharp, dull, and aching Stage: Acute 24 hour pattern: NA   Occupation: NA  Assistive Device: walker and cane  Hand Dominance: NA  Patient Goals/Specific Activities: improve function of hip, and walk with least restrictive AD   OBJECTIVE:   DIAGNOSTIC FINDINGS:  NA  GENERAL OBSERVATION/GAIT: Slow antalgic gait using FWW  SENSATION: Light touch: Deficits bil feet   LE MMT:  MMT Right (Eval) Left (Eval)  Hip flexion (L2, L3) 3+   Knee extension (L3) 4   Knee flexion 3+   Hip abduction    Hip extension    Hip external rotation 3+   Hip internal rotation 3+   Hip adduction    Ankle dorsiflexion (L4)    Ankle plantarflexion (S1)    Ankle inversion    Ankle eversion    Great Toe ext (L5)    Grossly     (Blank rows = not tested, score listed is out of 5 possible points.  N = WNL, D = diminished, C = clear for gross weakness with myotome testing, * = concordant pain with testing)  LE ROM:  ROM Right (Eval) Left (Eval) L  Hip flexion  110 90* 103  Hip extension     Hip abduction     Hip adduction     Hip internal rotation     Hip external rotation     Knee extension     Knee flexion     Ankle dorsiflexion     Ankle plantarflexion     Ankle inversion     Ankle eversion      (Blank rows = not tested, N = WNL, * = concordant pain with testing)  Functional Tests  Eval 6/12   30'' STS: 6x  UE used? Y 8x w/ UE   10 m max gait speed: 23'', .43 m/s, AD: FWW                                                      PATIENT SURVEYS:  LEFS: 31/80   TODAY'S TREATMENT: [facility] Adult PT Treatment:                                                DATE: 08/14/23 MTPR along proximal rectus femoris  Modified thomas stretch 2 x 30 sec PNF contract/ relax  Bridge with glute set 2 x 10 holding x 2 sec Standing L hip flexor stretch 2 x 30 sec with glute set Standing hip extension in // 2 x 10 bil Amb with SPC with cues for contralateral arm swing Standing marching in bars with UE contralateral arm sing LAD grade I MHP in supine with for L anterior hip in extended positoin x 10 min   [facility] Adult PT Treatment:                                                DATE: 08/08/23 Therex:   Bike 5 min Modified thomas stretch - 1' x2 Gentle passive hip flexion to ~100 degrees Supine bridge - small arc - 3x8   LAQ 4# alternating - 3x8 ea  Therapeutic Activity  Lateral walking - w/ no UE support in // Fwd and retro walking in // with no UE support Hurdle step over 2x5 ea w/ UE support in //  Neuromuscular re-ed: Semi tandem (100%) L in rear - 30'' bouts  Gait training:  SPC training with min cues for sequencing and step length - 185'   [facility] Adult PT Treatment:                                                DATE: 07/30/23  Nustep L3 x 7 min Standing heel raises LAQ 2# alternating 2 x 20  Heel slide Left on slide board , left  QS into Towel roll left  SAQ Left Ball squeeze HL Supine hp abduction iso bilateral with  belt- legs over pillows HL clam with belt isometric Bridge  x 10 Manual STW to left anterior / medial thigh  Ice pack left anterior hip    HOME EXERCISE PROGRAM: Access Code: [email or web address] URL: [email or web address] Date: 07/19/2023 Prepared by: Lesleigh Rash  Exercises - Hooklying Clamshell with Resistance  - 1 x daily - 7 x weekly - 3 sets - 10 reps - Supine Hip Adduction Isometric with Ball  - 1 x daily - 7 x weekly - 2 sets - 10 reps - 10'' hold - Supine Heel Slide with Strap  - 3 x daily - 7 x weekly - 2 sets - 10 reps - Seated Long Arc Quad  - 1 x daily - 7 x weekly - 3 sets - 10 reps  Treatment priorities  5/27 6/10       Hip ext ROM Hip ext during gait        CC strengthening        balance        gait                 ASSESSMENT:  CLINICAL IMPRESSION:  08/14/2023:  Jerusha arrives to session noting no pain in the hip but reports mostly stiffness. Worked on L hip flexor STW and gentle hip distraction mobs to promote relief of hip flexor tension. Continued stretching hip flexor and activation of the glutes. She did well with dynamic balance with marching but did fatigue quickly. Utilized MHP end of session to calm down soreness end of session.   EVAL: Nobuko is a 73 y.o. female who presents to clinic with signs and sxs consistent with L hip pain and stiffness following direct anterior THA on 5/20.  Appears to be progressing as expected given time since surgery.  Provided HEP for some gentle hip exercises and ROM with instruction to hold clam at comfortable level with knees only slightly apart.  Deanette will benefit from skilled PT to address relevant deficits to improve safety, comfort, and independence with daily activities and mobility.    OBJECTIVE IMPAIRMENTS: Pain, hip ROM, LE and hip strength, balance, gait  ACTIVITY LIMITATIONS: standing, walking, transfers, bending, squatting, stairs, self care, community ambulation  PERSONAL FACTORS: See medical  history and pertinent history   REHAB POTENTIAL: Good  CLINICAL DECISION MAKING: Evolving/moderate complexity  EVALUATION COMPLEXITY: Moderate   GOALS:   SHORT TERM GOALS: Target date: 08/16/2023   Chaunice will be >75% HEP compliant to improve carryover between sessions and facilitate independent management of condition  Evaluation: ongoing Goal status: INITIAL   LONG TERM GOALS: Target date: 09/13/2023   Nai will self report >/= 50% decrease in pain from evaluation to improve function in daily tasks  Evaluation/Baseline: 9/10 max pain Goal status: INITIAL   2.  Merle will show a >/= 25 pt improvement in LEFS score (MCID is ~11% or 9 pts) as a proxy for functional improvement   Evaluation/Baseline: 31 pts Goal status: INITIAL   3.  Janijah will improve 30'' STS (MCID 2) to >/= 8x (w/ UE?: n) to show improved LE strength and improved transfers   Evaluation/Baseline: 6x  w/ UE? y Goal status: INITIAL   4.  Corianna will improve 10 meter max gait speed to .8 m/s (.1 m/s MCID) to show functional improvement in ambulation   Evaluation/Baseline: .43 m/s w/ FWW Goal status: INITIAL   Norms:     5.  Tzirel will improve the following MMTs to >/= 4/5 to show improvement in strength:     Evaluation/Baseline:  LE MMT:  MMT Right (Eval) Left (Eval)  Hip flexion (L2, L3) 3+   Knee extension (L3) 4   Knee flexion 3+   Hip abduction    Hip extension    Hip external rotation 3+   Hip internal rotation 3+   Hip adduction    Ankle dorsiflexion (L4)    Ankle plantarflexion (S1)    Ankle inversion    Ankle eversion    Great Toe ext (L5)    Grossly     (Blank rows = not tested, score listed is out of 5 possible points.  N = WNL, D = diminished, C = clear for gross weakness with myotome testing, * = concordant pain with testing)  Goal status: INITIAL  6.  Danne will be able to complete all need housework and shop with LRAD with confidence and not  limited by pain  Evaluation/Baseline: limited Goal status: INITIAL   PLAN: PT FREQUENCY: 1-2x/week  PT DURATION: 8 weeks  PLANNED INTERVENTIONS:  97164- PT Re-evaluation, 97110-Therapeutic exercises, 97530- Therapeutic activity, 97112- Neuromuscular re-education, 97535- Self Care, [id number]- Manual therapy, [id number]- Gait training, [id number]- Aquatic Therapy, [phone]- Electrical stimulation (manual), 97016- Vasopneumatic device, Taping, Dry Needling, Joint manipulation, and Spinal manipulation.  Margarett Viti PT, DPT, LAT, ATC  08/14/23  11:45 AM

## 2023-08-16 ENCOUNTER — Ambulatory Visit: Admitting: Physical Therapy

## 2023-08-16 ENCOUNTER — Encounter: Payer: Self-pay | Admitting: Physical Therapy

## 2023-08-16 DIAGNOSIS — M6281 Muscle weakness (generalized): Secondary | ICD-10-CM

## 2023-08-16 DIAGNOSIS — R2681 Unsteadiness on feet: Secondary | ICD-10-CM

## 2023-08-16 DIAGNOSIS — M25552 Pain in left hip: Secondary | ICD-10-CM

## 2023-08-16 NOTE — Therapy (Signed)
 OUTPATIENT PHYSICAL THERAPY DAILY NOTE  Patient Name: April Shaffer MRN: [id number] DOB:1950-12-26, 73 y.o., female Today's Date: 08/16/2023   PT End of Session - 08/16/23 1144     Visit Number 9    Number of Visits --   1-2 x per week   Date for PT Re-Evaluation 09/13/23    Authorization Type [employer] MCR - LEFS    Authorization Time Period 07/19/23-08/31/23    Authorization - Visit Number 9    Authorization - Number of Visits 12    Progress Note Due on Visit 10    PT Start Time 1145    PT Stop Time 1226    PT Time Calculation (min) 41 min           Past Medical History:  Diagnosis Date   Anemia    Arthritis    Blood transfusion without reported diagnosis    Carotid artery narrowing    Coronary artery disease    Cardiac catheterization November 2013: 50% ostial LAD stenosis 50% mid stenosis. 30% disease in the left circumflex.   Diabetes mellitus, type 2 (HCC)    Hyperlipidemia    Hypertension    Onychomycosis of toenail 07/31/2016   PAD (peripheral artery disease) (HCC)    Sleep apnea       Had surgery to correct   Thyroid  nodule    Past Surgical History:  Procedure Laterality Date   ABDOMINAL AORTOGRAM W/LOWER EXTREMITY N/A 08/07/2021   Procedure: ABDOMINAL AORTOGRAM W/LOWER EXTREMITY;  Surgeon: Adine Hoof, MD;  Location: [facility] INVASIVE CV LAB;  Service: Cardiovascular;  Laterality: N/A;   ABDOMINAL AORTOGRAM W/LOWER EXTREMITY Right 10/23/2021   Procedure: ABDOMINAL AORTOGRAM W/LOWER EXTREMITY;  Surgeon: Adine Hoof, MD;  Location: [facility] INVASIVE CV LAB;  Service: Cardiovascular;  Laterality: Right;   ABDOMINAL HYSTERECTOMY     APPLICATION OF INTRAOPERATIVE CT SCAN N/A 11/30/2020   Procedure: APPLICATION OF INTRAOPERATIVE CT SCAN;  Surgeon: Isadora Mar, MD;  Location: [facility] OR;  Service: Neurosurgery;  Laterality: N/A;   BACK SURGERY     CARDIAC CATHETERIZATION     CATARACT EXTRACTION, BILATERAL Bilateral 2021   Dr. Brooks Cao   ENDARTERECTOMY   09/27/2011   Procedure: RIGT ENDARTERECTOMY CAROTID;  Surgeon: Palma Bob, MD;  Location: [facility] OR;  Service: Vascular;  Laterality: Right;   EPIDURAL BLOCK INJECTION  02/2008   Drs. Maxwell Spark   EYE SURGERY Bilateral 2021   cataract   gyn surgery  2004   total hysterectomy for mennorhagia,,salpingoophorectomy   INCISION AND DRAINAGE ABSCESS Left 05/08/2022   Procedure: ARTHROSCOPIC INCISION AND DRAINAGE ABSCESS, DISTAL CLAVICLE EXCISSION, SUBACROMIAL DECOMPRESSION, LOOSE BODY REMOVAL, EXTENSIVE DEBRIDEMENT;  Surgeon: Saundra Curl, MD;  Location: [facility] ORS;  Service: Orthopedics;  Laterality: Left;   LAMINECTOMY WITH POSTERIOR LATERAL ARTHRODESIS LEVEL 2 N/A 11/30/2020   Procedure: LUMBAR FOUR-FIVE, LUMBAR FIVE-SACRAL ONE POSTERIOR LATERAL FUSION WITH REVISION OF LUMBAR ONE-FIVE HARDWARE AND EXTENSION TO SACRAL ONE AND SACRAL TWO;  Surgeon: Isadora Mar, MD;  Location: [facility] OR;  Service: Neurosurgery;  Laterality: N/A;   LUMBAR WOUND DEBRIDEMENT N/A 05/25/2020   Procedure: LUMBAR WOUND IRRIGATION AND DEBRIDEMENT;  Surgeon: Isadora Mar, MD;  Location: [facility] OR;  Service: Neurosurgery;  Laterality: N/A;   PERIPHERAL VASCULAR INTERVENTION  08/07/2021   Procedure: PERIPHERAL VASCULAR INTERVENTION;  Surgeon: Adine Hoof, MD;  Location: [facility] INVASIVE CV LAB;  Service: Cardiovascular;;  Rt SFA   PERIPHERAL VASCULAR INTERVENTION Right 10/23/2021   Procedure: PERIPHERAL VASCULAR INTERVENTION;  Surgeon: Adine Hoof, MD;  Location: [facility] INVASIVE CV LAB;  Service: Cardiovascular;  Laterality: Right;  SFA   POSTERIOR LUMBAR FUSION 4 LEVEL N/A 04/25/2020   Procedure: POSTERIOR LUMBAR INTERBODY FUSION LUMBAR ONE-TWO, LUMBAR TWO-THREE, LUMBAR THREE-FOUR,LUMBAR FOUR-FIVE.;  Surgeon: Isadora Mar, MD;  Location: [facility] OR;  Service: Neurosurgery;  Laterality: N/A;  posterior   SHOULDER ARTHROSCOPY Left 05/08/2022   Procedure: ARTHROSCOPY SHOULDER;  Surgeon: Saundra Curl,  MD;  Location: [facility] ORS;  Service: Orthopedics;  Laterality: Left;   TONSILLECTOMY     TOTAL ABDOMINAL HYSTERECTOMY W/ BILATERAL SALPINGOOPHORECTOMY Bilateral 2000   TOTAL HIP ARTHROPLASTY Right 08/22/2018   Procedure: TOTAL HIP ARTHROPLASTY;  Surgeon: Marlena Sima, MD;  Location: [facility] OR[facility];  Service: Orthopedics;  Laterality: Right;   TOTAL HIP ARTHROPLASTY Left 07/16/2023   Procedure: ARTHROPLASTY, HIP, TOTAL, ANTERIOR APPROACH;  Surgeon: Saundra Curl, MD;  Location: [facility] ORS;  Service: Orthopedics;  Laterality: Left;   TRANSCAROTID ARTERY REVASCULARIZATION  Left 08/07/2022   Procedure: Transcarotid Artery Revascularization;  Surgeon: Adine Hoof, MD;  Location: [facility] OR;  Service: Vascular;  Laterality: Left;   ULTRASOUND GUIDANCE FOR VASCULAR ACCESS Left 08/07/2022   Procedure: ULTRASOUND GUIDANCE FOR VASCULAR ACCESS;  Surgeon: Adine Hoof, MD;  Location: [facility] OR;  Service: Vascular;  Laterality: Left;   VARICOSE VEIN SURGERY  2008   stripping   Patient Active Problem List   Diagnosis Date Noted   S/P total left hip arthroplasty 07/16/2023   Asymptomatic carotid artery stenosis without infarction, left 08/07/2022   Observation after surgery 08/07/2022   Septic arthritis of shoulder (HCC) 05/07/2022   Anemia of chronic disease 04/10/2022   Lumbar pseudoarthrosis 01/24/2022   Occlusion and stenosis of right carotid artery 01/24/2022   Iron  deficiency anemia secondary to blood loss (chronic) 01/08/2022   B12 deficiency 08/31/2021   Medication monitoring encounter 12/12/2020   Nausea 12/12/2020   Hammer toes of both feet 11/09/2020   Acute maxillary sinusitis 08/08/2020   Hyperlipidemia 08/08/2020   Pain due to onychomycosis of toenails of both feet 08/08/2020   PICC (peripherally inserted central catheter) in place 06/22/2020   Therapeutic drug monitoring 06/22/2020   Wound infection after surgery 05/25/2020   Wound drainage 05/24/2020   Local infection of  the skin and subcutaneous tissue, unspecified 05/24/2020   Body mass index (BMI) 27.0-27.9, adult 05/10/2020   S/P lumbar fusion 04/25/2020   Microalbuminuria due to type 2 diabetes mellitus (HCC) 08/07/2019   Primary localized osteoarthritis of right hip 08/22/2018   Carpal tunnel syndrome, bilateral 08/06/2018   Type 2 diabetes mellitus with peripheral neuropathy (HCC) 08/06/2018   Degenerative joint disease of right hip 08/06/2018   Preoperative clearance 03/26/2018   Upper back pain on left side 10/30/2016   Encounter for postoperative carotid endarterectomy surveillance 12/07/2015   Varicosities of leg 06/03/2015   Acute left lumbar radiculopathy 04/15/2014   Chronic sciatica of right side 03/17/2013   Diabetic peripheral neuropathy (HCC) 03/17/2013   Coronary artery disease    Carotid artery stenosis s/p CEA 2013 10/16/2011   Carotid artery occlusion without infarction, right 09/17/2011   Carotid bruit 08/28/2011   Gout 04/13/2011   DM type 2 with diabetic peripheral neuropathy (HCC) 04/02/2008   Hyperlipidemia associated with type 2 diabetes mellitus (HCC) 04/02/2008   Essential hypertension 04/02/2008   DEGENERATIVE DISC DISEASE, LUMBOSACRAL SPINE 04/02/2008    PCP: Shannan Dart., FNP  REFERRING PROVIDER: Saundra Curl, MD  THERAPY DIAG:  Pain in  left hip  Muscle weakness  Unsteadiness on feet  REFERRING DIAG:  THA 5/20  Rationale for Evaluation and Treatment:  Rehabilitation  SUBJECTIVE:  PERTINENT PAST HISTORY:  DM II, gout, diabetic peripheral neuropathy (feet), CAD, hx of lumbar surgery, R THA         PRECAUTIONS: Fall  OP Date: 07/16/2023  2 weeks 07/30/2023  4 weeks 08/13/2023  6 weeks 08/27/2023  8 weeks 09/10/2023  10 weeks 09/24/2023  12 weeks 10/08/2023    WEIGHT BEARING RESTRICTIONS No  FALLS:  Has patient fallen in last 6 months? No, Number of falls: 0  MOI/History of condition:  Onset date: 5/20  SUBJECTIVE STATEMENT:    08/16/2023: P reports she was sore after last visit, but was expecting this.  EVAL: CORLETTE CIANO is a 73 y.o. female who presents to clinic with chief complaint of L hip pain following L A THA on 5/20.  She reports she is sore but doing well.  She was using a walker and cane before surgery and would like to get back to using only the cane  She has a fairly sedentary lifestyle but her L hip pain got to the point that it was limiting her daily housework and ability to walk in the grocery store.  She has a boyfriend who can help at times but I can do it on my own, I don't want to rely on anyone else. 1 STE house.   Red flags:  denies malaise, erythema / streaking, and chills / night sweats  Pain:  Are you having pain? Yes Pain location: L hip  NPRS scale:  0/10 (last took pain pill at 10 am)  Aggravating factors: walking, standing, moving around Relieving factors: rest and medication Pain description: sharp, dull, and aching Stage: Acute 24 hour pattern: NA   Occupation: NA  Assistive Device: walker and cane  Hand Dominance: NA  Patient Goals/Specific Activities: improve function of hip, and walk with least restrictive AD   OBJECTIVE:   DIAGNOSTIC FINDINGS:  NA  GENERAL OBSERVATION/GAIT: Slow antalgic gait using FWW  SENSATION: Light touch: Deficits bil feet   LE MMT:  MMT Right (Eval) Left (Eval)  Hip flexion (L2, L3) 3+   Knee extension (L3) 4   Knee flexion 3+   Hip abduction    Hip extension    Hip external rotation 3+   Hip internal rotation 3+   Hip adduction    Ankle dorsiflexion (L4)    Ankle plantarflexion (S1)    Ankle inversion    Ankle eversion    Great Toe ext (L5)    Grossly     (Blank rows = not tested, score listed is out of 5 possible points.  N = WNL, D = diminished, C = clear for gross weakness with myotome testing, * = concordant pain with testing)  LE ROM:  ROM Right (Eval) Left (Eval) L  Hip flexion 110 90* 103  Hip  extension     Hip abduction     Hip adduction     Hip internal rotation     Hip external rotation     Knee extension     Knee flexion     Ankle dorsiflexion     Ankle plantarflexion     Ankle inversion     Ankle eversion      (Blank rows = not tested, N = WNL, * = concordant pain with testing)  Functional Tests  Eval 6/12   30'' STS: 6x  UE used? Y 8x w/ UE   10 m max gait speed: 23'', .43 m/s, AD: FWW                                                      PATIENT SURVEYS:  LEFS: 31/80   TODAY'S TREATMENT:  [facility] Adult PT Treatment:                                                DATE: 08/16/23 Therex:   Bike 5 min Modified thomas stretch - 1' x2 Supine clam - GTB - 2x10 LAQ - 5# - 3x7 ea Supine bridge - small arc - 2x10  Therapeutic Activity  STS raised table - 3x5 - raised table - min UE Lateral walking - w/ no UE support in // Standing heel raises no UE support in // Fwd and retro walking in // with no UE support Hurdle step over x7 ea w/ contra UE support in // Step up - 4'' step - 2x7 ea - w/ bil UE support then contralateral only    HOME EXERCISE PROGRAM: Access Code: [email or web address] URL: [email or web address] Date: 08/16/2023 Prepared by: Lesleigh Rash  Exercises - Hooklying Clamshell with Resistance  - 1 x daily - 7 x weekly - 3 sets - 10 reps - Supine Hip Adduction Isometric with Ball  - 1 x daily - 7 x weekly - 2 sets - 10 reps - 10'' hold - Hip Flexor Stretch at Edge of Bed  - 2 x daily - 7 x weekly - 2 sets - 45 second hold - Sit to Stand Without Arm Support  - 1 x daily - 4-7 x weekly - 3 sets - 5 reps - Standing March with Counter Support  - 1 x daily - 7 x weekly - 3 sets - 5 reps  Treatment priorities   5/27 6/10       Hip ext ROM Hip ext during gait        CC strengthening        balance        gait                 ASSESSMENT:  CLINICAL IMPRESSION:  08/16/2023:  Laura-Lee arrives to session noting no pain in  the hip but reports mostly stiffness. Worked on hip ext ROM to begin session followed by standing exercises with reduced UE support.  Pt tolerated well with fatigue but no increase in pain.  Updated HEP and encouraged her to try STS from raised surface (such as her adjustable bed) with no UE support.  She plans on bringing cane next visit.  EVAL: Myrtis is a 73 y.o. female who presents to clinic with signs and sxs consistent with L hip pain and stiffness following direct anterior THA on 5/20.  Appears to be progressing as expected given time since surgery.  Provided HEP for some gentle hip exercises and ROM with instruction to hold clam at comfortable level with knees only slightly apart.  Arnitra will benefit from skilled PT to address relevant deficits to improve safety, comfort, and independence with daily activities and mobility.    OBJECTIVE IMPAIRMENTS: Pain, hip ROM, LE and hip  strength, balance, gait  ACTIVITY LIMITATIONS: standing, walking, transfers, bending, squatting, stairs, self care, community ambulation  PERSONAL FACTORS: See medical history and pertinent history   REHAB POTENTIAL: Good  CLINICAL DECISION MAKING: Evolving/moderate complexity  EVALUATION COMPLEXITY: Moderate   GOALS:   SHORT TERM GOALS: Target date: 08/16/2023   Jurline will be >75% HEP compliant to improve carryover between sessions and facilitate independent management of condition  Evaluation: ongoing Goal status: INITIAL   LONG TERM GOALS: Target date: 09/13/2023   Brean will self report >/= 50% decrease in pain from evaluation to improve function in daily tasks  Evaluation/Baseline: 9/10 max pain Goal status: INITIAL   2.  Jacinta will show a >/= 25 pt improvement in LEFS score (MCID is ~11% or 9 pts) as a proxy for functional improvement   Evaluation/Baseline: 31 pts Goal status: INITIAL   3.  Tijana will improve 30'' STS (MCID 2) to >/= 8x (w/ UE?: n) to show improved LE strength  and improved transfers   Evaluation/Baseline: 6x  w/ UE? y Goal status: INITIAL   4.  Alexis will improve 10 meter max gait speed to .8 m/s (.1 m/s MCID) to show functional improvement in ambulation   Evaluation/Baseline: .43 m/s w/ FWW Goal status: INITIAL   Norms:     5.  Torre will improve the following MMTs to >/= 4/5 to show improvement in strength:     Evaluation/Baseline:  LE MMT:  MMT Right (Eval) Left (Eval)  Hip flexion (L2, L3) 3+   Knee extension (L3) 4   Knee flexion 3+   Hip abduction    Hip extension    Hip external rotation 3+   Hip internal rotation 3+   Hip adduction    Ankle dorsiflexion (L4)    Ankle plantarflexion (S1)    Ankle inversion    Ankle eversion    Great Toe ext (L5)    Grossly     (Blank rows = not tested, score listed is out of 5 possible points.  N = WNL, D = diminished, C = clear for gross weakness with myotome testing, * = concordant pain with testing)  Goal status: INITIAL   6.  Melyna will be able to complete all need housework and shop with LRAD with confidence and not limited by pain  Evaluation/Baseline: limited Goal status: INITIAL   PLAN: PT FREQUENCY: 1-2x/week  PT DURATION: 8 weeks  PLANNED INTERVENTIONS:  [id number]- PT Re-evaluation, 97110-Therapeutic exercises, 97530- Therapeutic activity, [id number]- Neuromuscular re-education, 97535- Self Care, [id number]- Manual therapy, [id number]- Gait training, [id number]- Aquatic Therapy, [phone]- Electrical stimulation (manual), [id number]- Vasopneumatic device, Taping, Dry Needling, Joint manipulation, and Spinal manipulation.  Ariauna Farabee E Paisly [place] PT 08/16/23  12:34 PM

## 2023-08-20 ENCOUNTER — Ambulatory Visit: Admitting: Physical Therapy

## 2023-08-20 NOTE — Therapy (Deleted)
 OUTPATIENT PHYSICAL THERAPY DAILY NOTE  Patient Name: April Shaffer MRN: [id number] DOB:[birth year], 73 y.o., female Today's Date: 08/20/2023      Past Medical History:  Diagnosis Date   Anemia    Arthritis    Blood transfusion without reported diagnosis    Carotid artery narrowing    Coronary artery disease    Cardiac catheterization November 2013: 50% ostial LAD stenosis 50% mid stenosis. 30% disease in the left circumflex.   Diabetes mellitus, type 2 (HCC)    Hyperlipidemia    Hypertension    Onychomycosis of toenail 07/31/2016   PAD (peripheral artery disease) (HCC)    Sleep apnea       Had surgery to correct   Thyroid  nodule    Past Surgical History:  Procedure Laterality Date   ABDOMINAL AORTOGRAM W/LOWER EXTREMITY N/A 08/07/2021   Procedure: ABDOMINAL AORTOGRAM W/LOWER EXTREMITY;  Surgeon: Sheree Penne Bruckner, MD;  Location: [facility] INVASIVE CV LAB;  Service: Cardiovascular;  Laterality: N/A;   ABDOMINAL AORTOGRAM W/LOWER EXTREMITY Right 10/23/2021   Procedure: ABDOMINAL AORTOGRAM W/LOWER EXTREMITY;  Surgeon: Sheree Penne Bruckner, MD;  Location: [facility] INVASIVE CV LAB;  Service: Cardiovascular;  Laterality: Right;   ABDOMINAL HYSTERECTOMY     APPLICATION OF INTRAOPERATIVE CT SCAN N/A 11/30/2020   Procedure: APPLICATION OF INTRAOPERATIVE CT SCAN;  Surgeon: Joshua Alm RAMAN, MD;  Location: [facility] OR;  Service: Neurosurgery;  Laterality: N/A;   BACK SURGERY     CARDIAC CATHETERIZATION     CATARACT EXTRACTION, BILATERAL Bilateral 2021   Dr. Milissa   ENDARTERECTOMY  09/27/2011   Procedure: RIGT ENDARTERECTOMY CAROTID;  Surgeon: Lynwood JONETTA Collum, MD;  Location: [facility] OR;  Service: Vascular;  Laterality: Right;   EPIDURAL BLOCK INJECTION  02/2008   Drs. Clydell Mince   EYE SURGERY Bilateral 2021   cataract   gyn surgery  2004   total hysterectomy for mennorhagia,,salpingoophorectomy   INCISION AND DRAINAGE ABSCESS Left 05/08/2022   Procedure: ARTHROSCOPIC INCISION AND  DRAINAGE ABSCESS, DISTAL CLAVICLE EXCISSION, SUBACROMIAL DECOMPRESSION, LOOSE BODY REMOVAL, EXTENSIVE DEBRIDEMENT;  Surgeon: Beverley Evalene JONETTA, MD;  Location: [facility] ORS;  Service: Orthopedics;  Laterality: Left;   LAMINECTOMY WITH POSTERIOR LATERAL ARTHRODESIS LEVEL 2 N/A 11/30/2020   Procedure: LUMBAR FOUR-FIVE, LUMBAR FIVE-SACRAL ONE POSTERIOR LATERAL FUSION WITH REVISION OF LUMBAR ONE-FIVE HARDWARE AND EXTENSION TO SACRAL ONE AND SACRAL TWO;  Surgeon: Joshua Alm RAMAN, MD;  Location: [facility] OR;  Service: Neurosurgery;  Laterality: N/A;   LUMBAR WOUND DEBRIDEMENT N/A 05/25/2020   Procedure: LUMBAR WOUND IRRIGATION AND DEBRIDEMENT;  Surgeon: Joshua Alm RAMAN, MD;  Location: [facility] OR;  Service: Neurosurgery;  Laterality: N/A;   PERIPHERAL VASCULAR INTERVENTION  08/07/2021   Procedure: PERIPHERAL VASCULAR INTERVENTION;  Surgeon: Sheree Penne Bruckner, MD;  Location: [facility] INVASIVE CV LAB;  Service: Cardiovascular;;  Rt SFA   PERIPHERAL VASCULAR INTERVENTION Right 10/23/2021   Procedure: PERIPHERAL VASCULAR INTERVENTION;  Surgeon: Sheree Penne Bruckner, MD;  Location: [facility] INVASIVE CV LAB;  Service: Cardiovascular;  Laterality: Right;  SFA   POSTERIOR LUMBAR FUSION 4 LEVEL N/A 04/25/2020   Procedure: POSTERIOR LUMBAR INTERBODY FUSION LUMBAR ONE-TWO, LUMBAR TWO-THREE, LUMBAR THREE-FOUR,LUMBAR FOUR-FIVE.;  Surgeon: Joshua Alm RAMAN, MD;  Location: [facility] OR;  Service: Neurosurgery;  Laterality: N/A;  posterior   SHOULDER ARTHROSCOPY Left 05/08/2022   Procedure: ARTHROSCOPY SHOULDER;  Surgeon: Beverley Evalene JONETTA, MD;  Location: [facility] ORS;  Service: Orthopedics;  Laterality: Left;   TONSILLECTOMY     TOTAL ABDOMINAL HYSTERECTOMY W/ BILATERAL SALPINGOOPHORECTOMY Bilateral 2000   TOTAL  HIP ARTHROPLASTY Right 08/22/2018   Procedure: TOTAL HIP ARTHROPLASTY;  Surgeon: Shari Sieving, MD;  Location: [facility] ORS;  Service: Orthopedics;  Laterality: Right;   TOTAL HIP ARTHROPLASTY Left 07/16/2023   Procedure: ARTHROPLASTY, HIP, TOTAL,  ANTERIOR APPROACH;  Surgeon: Beverley Evalene BIRCH, MD;  Location: [facility] ORS;  Service: Orthopedics;  Laterality: Left;   TRANSCAROTID ARTERY REVASCULARIZATION  Left 08/07/2022   Procedure: Transcarotid Artery Revascularization;  Surgeon: Sheree Penne Bruckner, MD;  Location: [facility] OR;  Service: Vascular;  Laterality: Left;   ULTRASOUND GUIDANCE FOR VASCULAR ACCESS Left 08/07/2022   Procedure: ULTRASOUND GUIDANCE FOR VASCULAR ACCESS;  Surgeon: Sheree Penne Bruckner, MD;  Location: [facility] OR;  Service: Vascular;  Laterality: Left;   VARICOSE VEIN SURGERY  2008   stripping   Patient Active Problem List   Diagnosis Date Noted   S/P total left hip arthroplasty 07/16/2023   Asymptomatic carotid artery stenosis without infarction, left 08/07/2022   Observation after surgery 08/07/2022   Septic arthritis of shoulder (HCC) 05/07/2022   Anemia of chronic disease 04/10/2022   Lumbar pseudoarthrosis 01/24/2022   Occlusion and stenosis of right carotid artery 01/24/2022   Iron  deficiency anemia secondary to blood loss (chronic) 01/08/2022   B12 deficiency 08/31/2021   Medication monitoring encounter 12/12/2020   Nausea 12/12/2020   Hammer toes of both feet 11/09/2020   Acute maxillary sinusitis 08/08/2020   Hyperlipidemia 08/08/2020   Pain due to onychomycosis of toenails of both feet 08/08/2020   PICC (peripherally inserted central catheter) in place 06/22/2020   Therapeutic drug monitoring 06/22/2020   Wound infection after surgery 05/25/2020   Wound drainage 05/24/2020   Local infection of the skin and subcutaneous tissue, unspecified 05/24/2020   Body mass index (BMI) 27.0-27.9, adult 05/10/2020   S/P lumbar fusion 04/25/2020   Microalbuminuria due to type 2 diabetes mellitus (HCC) 08/07/2019   Primary localized osteoarthritis of right hip 08/22/2018   Carpal tunnel syndrome, bilateral 08/06/2018   Type 2 diabetes mellitus with peripheral neuropathy (HCC) 08/06/2018   Degenerative joint disease  of right hip 08/06/2018   Preoperative clearance 03/26/2018   Upper back pain on left side 10/30/2016   Encounter for postoperative carotid endarterectomy surveillance 12/07/2015   Varicosities of leg 06/03/2015   Acute left lumbar radiculopathy 04/15/2014   Chronic sciatica of right side 03/17/2013   Diabetic peripheral neuropathy (HCC) 03/17/2013   Coronary artery disease    Carotid artery stenosis s/p CEA 2013 10/16/2011   Carotid artery occlusion without infarction, right 09/17/2011   Carotid bruit 08/28/2011   Gout 04/13/2011   DM type 2 with diabetic peripheral neuropathy (HCC) 04/02/2008   Hyperlipidemia associated with type 2 diabetes mellitus (HCC) 04/02/2008   Essential hypertension 04/02/2008   DEGENERATIVE DISC DISEASE, LUMBOSACRAL SPINE 04/02/2008    PCP: Claudene Prentice DELENA Mickey., FNP  REFERRING PROVIDER: Claudene Prentice DELENA Mickey., FNP  THERAPY DIAG:  No diagnosis found.  REFERRING DIAG:  THA 5/20  Rationale for Evaluation and Treatment:  Rehabilitation  SUBJECTIVE:  PERTINENT PAST HISTORY:  DM II, gout, diabetic peripheral neuropathy (feet), CAD, hx of lumbar surgery, R THA         PRECAUTIONS: Fall  OP Date: 07/16/2023  2 weeks 07/30/2023  4 weeks 08/13/2023  6 weeks 08/27/2023  8 weeks 09/10/2023  10 weeks 09/24/2023  12 weeks 10/08/2023    WEIGHT BEARING RESTRICTIONS No  FALLS:  Has patient fallen in last 6 months? No, Number of falls: 0  MOI/History of condition:  Onset date: 5/20  SUBJECTIVE STATEMENT:   08/20/2023: P reports she was sore after last visit, but was expecting this.  EVAL: April Shaffer is a 73 y.o. female who presents to clinic with chief complaint of L hip pain following L A THA on 5/20.  She reports she is sore but doing well.  She was using a walker and cane before surgery and would like to get back to using only the cane  She has a fairly sedentary lifestyle but her L hip pain got to the point that it was limiting her daily housework and  ability to walk in the grocery store.  She has a boyfriend who can help at times but I can do it on my own, I don't want to rely on anyone else. 1 STE house.   Red flags:  denies malaise, erythema / streaking, and chills / night sweats  Pain:  Are you having pain? Yes Pain location: L hip  NPRS scale:  0/10 (last took pain pill at 10 am)  Aggravating factors: walking, standing, moving around Relieving factors: rest and medication Pain description: sharp, dull, and aching Stage: Acute 24 hour pattern: NA   Occupation: NA  Assistive Device: walker and cane  Hand Dominance: NA  Patient Goals/Specific Activities: improve function of hip, and walk with least restrictive AD   OBJECTIVE:   DIAGNOSTIC FINDINGS:  NA  GENERAL OBSERVATION/GAIT: Slow antalgic gait using FWW  SENSATION: Light touch: Deficits bil feet   LE MMT:  MMT Right (Eval) Left (Eval)  Hip flexion (L2, L3) 3+   Knee extension (L3) 4   Knee flexion 3+   Hip abduction    Hip extension    Hip external rotation 3+   Hip internal rotation 3+   Hip adduction    Ankle dorsiflexion (L4)    Ankle plantarflexion (S1)    Ankle inversion    Ankle eversion    Great Toe ext (L5)    Grossly     (Blank rows = not tested, score listed is out of 5 possible points.  N = WNL, D = diminished, C = clear for gross weakness with myotome testing, * = concordant pain with testing)  LE ROM:  ROM Right (Eval) Left (Eval) L  Hip flexion 110 90* 103  Hip extension     Hip abduction     Hip adduction     Hip internal rotation     Hip external rotation     Knee extension     Knee flexion     Ankle dorsiflexion     Ankle plantarflexion     Ankle inversion     Ankle eversion      (Blank rows = not tested, N = WNL, * = concordant pain with testing)  Functional Tests  Eval 6/12   30'' STS: 6x  UE used? Y 8x w/ UE   10 m max gait speed: 23'', .43 m/s, AD: FWW                                                       PATIENT SURVEYS:  LEFS: 31/80   TODAY'S TREATMENT:  [facility] Adult PT Treatment:  DATE: 08/16/23 Therex:   Bike 5 min Modified thomas stretch - 1' x2 Supine clam - GTB - 2x10 LAQ - 5# - 3x7 ea Supine bridge - small arc - 2x10  Therapeutic Activity  STS raised table - 3x5 - raised table - min UE Lateral walking - w/ no UE support in // Standing heel raises no UE support in // Fwd and retro walking in // with no UE support Hurdle step over x7 ea w/ contra UE support in // Step up - 4'' step - 2x7 ea - w/ bil UE support then contralateral only    HOME EXERCISE PROGRAM: Access Code: [email or web address] URL: [email or web address] Date: 08/16/2023 Prepared by: Helene Gasmen  Exercises - Hooklying Clamshell with Resistance  - 1 x daily - 7 x weekly - 3 sets - 10 reps - Supine Hip Adduction Isometric with Ball  - 1 x daily - 7 x weekly - 2 sets - 10 reps - 10'' hold - Hip Flexor Stretch at Edge of Bed  - 2 x daily - 7 x weekly - 2 sets - 45 second hold - Sit to Stand Without Arm Support  - 1 x daily - 4-7 x weekly - 3 sets - 5 reps - Standing March with Counter Support  - 1 x daily - 7 x weekly - 3 sets - 5 reps  Treatment priorities   5/27 6/10       Hip ext ROM Hip ext during gait        CC strengthening        balance        gait                 ASSESSMENT:  CLINICAL IMPRESSION:  08/20/2023:  April Shaffer arrives to session noting no pain in the hip but reports mostly stiffness. Worked on hip ext ROM to begin session followed by standing exercises with reduced UE support.  Pt tolerated well with fatigue but no increase in pain.  Updated HEP and encouraged her to try STS from raised surface (such as her adjustable bed) with no UE support.  She plans on bringing cane next visit.  EVAL: April Shaffer is a 73 y.o. female who presents to clinic with signs and sxs consistent with L hip pain and stiffness following direct  anterior THA on 5/20.  Appears to be progressing as expected given time since surgery.  Provided HEP for some gentle hip exercises and ROM with instruction to hold clam at comfortable level with knees only slightly apart.  April Shaffer will benefit from skilled PT to address relevant deficits to improve safety, comfort, and independence with daily activities and mobility.    OBJECTIVE IMPAIRMENTS: Pain, hip ROM, LE and hip strength, balance, gait  ACTIVITY LIMITATIONS: standing, walking, transfers, bending, squatting, stairs, self care, community ambulation  PERSONAL FACTORS: See medical history and pertinent history   REHAB POTENTIAL: Good  CLINICAL DECISION MAKING: Evolving/moderate complexity  EVALUATION COMPLEXITY: Moderate   GOALS:   SHORT TERM GOALS: Target date: 08/16/2023   Sadiyah will be >75% HEP compliant to improve carryover between sessions and facilitate independent management of condition  Evaluation: ongoing Goal status: INITIAL   LONG TERM GOALS: Target date: 09/13/2023   April Shaffer will self report >/= 50% decrease in pain from evaluation to improve function in daily tasks  Evaluation/Baseline: 9/10 max pain Goal status: INITIAL   2.  April Shaffer will show a >/= 25 pt improvement in LEFS score (MCID is ~11% or 9 pts)  as a proxy for functional improvement   Evaluation/Baseline: 31 pts Goal status: INITIAL   3.  April Shaffer will improve 30'' STS (MCID 2) to >/= 8x (w/ UE?: n) to show improved LE strength and improved transfers   Evaluation/Baseline: 6x  w/ UE? y Goal status: INITIAL   4.  April Shaffer will improve 10 meter max gait speed to .8 m/s (.1 m/s MCID) to show functional improvement in ambulation   Evaluation/Baseline: .43 m/s w/ FWW Goal status: INITIAL   [place]:     5.  April Shaffer will improve the following MMTs to >/= 4/5 to show improvement in strength:     Evaluation/Baseline:  LE MMT:  MMT Right (Eval) Left (Eval)  Hip flexion (L2, L3) 3+    Knee extension (L3) 4   Knee flexion 3+   Hip abduction    Hip extension    Hip external rotation 3+   Hip internal rotation 3+   Hip adduction    Ankle dorsiflexion (L4)    Ankle plantarflexion (S1)    Ankle inversion    Ankle eversion    Great Toe ext (L5)    Grossly     (Blank rows = not tested, score listed is out of 5 possible points.  N = WNL, D = diminished, C = clear for gross weakness with myotome testing, * = concordant pain with testing)  Goal status: INITIAL   6.  April Shaffer will be able to complete all need housework and shop with LRAD with confidence and not limited by pain  Evaluation/Baseline: limited Goal status: INITIAL   PLAN: PT FREQUENCY: 1-2x/week  PT DURATION: 8 weeks  PLANNED INTERVENTIONS:  [id number]- PT Re-evaluation, 97110-Therapeutic exercises, 97530- Therapeutic activity, [id number]- Neuromuscular re-education, 97535- Self Care, [id number]- Manual therapy, [id number]- Gait training, [id number]- Aquatic Therapy, [phone]- Electrical stimulation (manual), [id number]- Vasopneumatic device, Taping, Dry Needling, Joint manipulation, and Spinal manipulation.  Cledith Kamiya E Chandelle Harkey PT 08/20/23  8:20 AM

## 2023-08-21 ENCOUNTER — Telehealth: Payer: Self-pay | Admitting: Hematology

## 2023-08-21 ENCOUNTER — Inpatient Hospital Stay

## 2023-08-21 DIAGNOSIS — D508 Other iron deficiency anemias: Secondary | ICD-10-CM | POA: Diagnosis present

## 2023-08-21 DIAGNOSIS — E538 Deficiency of other specified B group vitamins: Secondary | ICD-10-CM | POA: Diagnosis present

## 2023-08-21 MED ORDER — CYANOCOBALAMIN 1000 MCG/ML IJ SOLN
1000.0000 ug | Freq: Once | INTRAMUSCULAR | Status: AC
  Administered 2023-08-21: 1000 ug via INTRAMUSCULAR
  Filled 2023-08-21: qty 1

## 2023-08-21 NOTE — Telephone Encounter (Signed)
 Scheduled appointment per patients request via incoming call. Talked with the patient and she is aware of the made appointment.

## 2023-08-22 ENCOUNTER — Ambulatory Visit: Admitting: Physical Therapy

## 2023-08-22 ENCOUNTER — Encounter: Payer: Self-pay | Admitting: Physical Therapy

## 2023-08-22 DIAGNOSIS — M25552 Pain in left hip: Secondary | ICD-10-CM | POA: Diagnosis not present

## 2023-08-22 DIAGNOSIS — R2681 Unsteadiness on feet: Secondary | ICD-10-CM

## 2023-08-22 DIAGNOSIS — M6281 Muscle weakness (generalized): Secondary | ICD-10-CM

## 2023-08-22 NOTE — Therapy (Signed)
 OUTPATIENT PHYSICAL THERAPY DAILY NOTE  Patient Name: April Shaffer MRN: [id number] DOB:08-Nov-1950, 73 y.o., female Today's Date: 08/22/2023   PT End of Session - 08/22/23 1057     Visit Number 10    Number of Visits --   1-2 x per week   Date for PT Re-Evaluation 09/13/23    Authorization Type [employer] MCR - LEFS    Authorization Time Period 07/19/23-08/31/23    Authorization - Number of Visits 12    Progress Note Due on Visit 10    PT Start Time 1100    PT Stop Time 1141    PT Time Calculation (min) 41 min            Past Medical History:  Diagnosis Date   Anemia    Arthritis    Blood transfusion without reported diagnosis    Carotid artery narrowing    Coronary artery disease    Cardiac catheterization November 2013: 50% ostial LAD stenosis 50% mid stenosis. 30% disease in the left circumflex.   Diabetes mellitus, type 2 (HCC)    Hyperlipidemia    Hypertension    Onychomycosis of toenail 07/31/2016   PAD (peripheral artery disease) (HCC)    Sleep apnea       Had surgery to correct   Thyroid  nodule    Past Surgical History:  Procedure Laterality Date   ABDOMINAL AORTOGRAM W/LOWER EXTREMITY N/A 08/07/2021   Procedure: ABDOMINAL AORTOGRAM W/LOWER EXTREMITY;  Surgeon: Sheree Penne Bruckner, MD;  Location: [facility] INVASIVE CV LAB;  Service: Cardiovascular;  Laterality: N/A;   ABDOMINAL AORTOGRAM W/LOWER EXTREMITY Right 10/23/2021   Procedure: ABDOMINAL AORTOGRAM W/LOWER EXTREMITY;  Surgeon: Sheree Penne Bruckner, MD;  Location: [facility] INVASIVE CV LAB;  Service: Cardiovascular;  Laterality: Right;   ABDOMINAL HYSTERECTOMY     APPLICATION OF INTRAOPERATIVE CT SCAN N/A 11/30/2020   Procedure: APPLICATION OF INTRAOPERATIVE CT SCAN;  Surgeon: Joshua Alm RAMAN, MD;  Location: [facility] OR;  Service: Neurosurgery;  Laterality: N/A;   BACK SURGERY     CARDIAC CATHETERIZATION     CATARACT EXTRACTION, BILATERAL Bilateral 2021   Dr. Milissa   ENDARTERECTOMY  09/27/2011   Procedure: RIGT  ENDARTERECTOMY CAROTID;  Surgeon: Lynwood JONETTA Collum, MD;  Location: [facility] OR;  Service: Vascular;  Laterality: Right;   EPIDURAL BLOCK INJECTION  02/2008   Drs. Clydell Mince   EYE SURGERY Bilateral 2021   cataract   gyn surgery  2004   total hysterectomy for mennorhagia,,salpingoophorectomy   INCISION AND DRAINAGE ABSCESS Left 05/08/2022   Procedure: ARTHROSCOPIC INCISION AND DRAINAGE ABSCESS, DISTAL CLAVICLE EXCISSION, SUBACROMIAL DECOMPRESSION, LOOSE BODY REMOVAL, EXTENSIVE DEBRIDEMENT;  Surgeon: Beverley Evalene JONETTA, MD;  Location: [facility] ORS;  Service: Orthopedics;  Laterality: Left;   LAMINECTOMY WITH POSTERIOR LATERAL ARTHRODESIS LEVEL 2 N/A 11/30/2020   Procedure: LUMBAR FOUR-FIVE, LUMBAR FIVE-SACRAL ONE POSTERIOR LATERAL FUSION WITH REVISION OF LUMBAR ONE-FIVE HARDWARE AND EXTENSION TO SACRAL ONE AND SACRAL TWO;  Surgeon: Joshua Alm RAMAN, MD;  Location: [facility] OR;  Service: Neurosurgery;  Laterality: N/A;   LUMBAR WOUND DEBRIDEMENT N/A 05/25/2020   Procedure: LUMBAR WOUND IRRIGATION AND DEBRIDEMENT;  Surgeon: Joshua Alm RAMAN, MD;  Location: [facility] OR;  Service: Neurosurgery;  Laterality: N/A;   PERIPHERAL VASCULAR INTERVENTION  08/07/2021   Procedure: PERIPHERAL VASCULAR INTERVENTION;  Surgeon: Sheree Penne Bruckner, MD;  Location: [facility] INVASIVE CV LAB;  Service: Cardiovascular;;  Rt SFA   PERIPHERAL VASCULAR INTERVENTION Right 10/23/2021   Procedure: PERIPHERAL VASCULAR INTERVENTION;  Surgeon: Sheree Penne Bruckner, MD;  Location: [facility] INVASIVE CV LAB;  Service: Cardiovascular;  Laterality: Right;  SFA   POSTERIOR LUMBAR FUSION 4 LEVEL N/A 04/25/2020   Procedure: POSTERIOR LUMBAR INTERBODY FUSION LUMBAR ONE-TWO, LUMBAR TWO-THREE, LUMBAR THREE-FOUR,LUMBAR FOUR-FIVE.;  Surgeon: Joshua Alm RAMAN, MD;  Location: [facility] OR;  Service: Neurosurgery;  Laterality: N/A;  posterior   SHOULDER ARTHROSCOPY Left 05/08/2022   Procedure: ARTHROSCOPY SHOULDER;  Surgeon: Beverley Evalene BIRCH, MD;  Location: [facility] ORS;  Service:  Orthopedics;  Laterality: Left;   TONSILLECTOMY     TOTAL ABDOMINAL HYSTERECTOMY W/ BILATERAL SALPINGOOPHORECTOMY Bilateral 2000   TOTAL HIP ARTHROPLASTY Right 08/22/2018   Procedure: TOTAL HIP ARTHROPLASTY;  Surgeon: Shari Sieving, MD;  Location: [facility] ORS;  Service: Orthopedics;  Laterality: Right;   TOTAL HIP ARTHROPLASTY Left 07/16/2023   Procedure: ARTHROPLASTY, HIP, TOTAL, ANTERIOR APPROACH;  Surgeon: Beverley Evalene BIRCH, MD;  Location: [facility] ORS;  Service: Orthopedics;  Laterality: Left;   TRANSCAROTID ARTERY REVASCULARIZATION  Left 08/07/2022   Procedure: Transcarotid Artery Revascularization;  Surgeon: Sheree Penne Bruckner, MD;  Location: [facility] OR;  Service: Vascular;  Laterality: Left;   ULTRASOUND GUIDANCE FOR VASCULAR ACCESS Left 08/07/2022   Procedure: ULTRASOUND GUIDANCE FOR VASCULAR ACCESS;  Surgeon: Sheree Penne Bruckner, MD;  Location: [facility] OR;  Service: Vascular;  Laterality: Left;   VARICOSE VEIN SURGERY  2008   stripping   Patient Active Problem List   Diagnosis Date Noted   S/P total left hip arthroplasty 07/16/2023   Asymptomatic carotid artery stenosis without infarction, left 08/07/2022   Observation after surgery 08/07/2022   Septic arthritis of shoulder (HCC) 05/07/2022   Anemia of chronic disease 04/10/2022   Lumbar pseudoarthrosis 01/24/2022   Occlusion and stenosis of right carotid artery 01/24/2022   Iron  deficiency anemia secondary to blood loss (chronic) 01/08/2022   B12 deficiency 08/31/2021   Medication monitoring encounter 12/12/2020   Nausea 12/12/2020   Hammer toes of both feet 11/09/2020   Acute maxillary sinusitis 08/08/2020   Hyperlipidemia 08/08/2020   Pain due to onychomycosis of toenails of both feet 08/08/2020   PICC (peripherally inserted central catheter) in place 06/22/2020   Therapeutic drug monitoring 06/22/2020   Wound infection after surgery 05/25/2020   Wound drainage 05/24/2020   Local infection of the skin and subcutaneous tissue,  unspecified 05/24/2020   Body mass index (BMI) 27.0-27.9, adult 05/10/2020   S/P lumbar fusion 04/25/2020   Microalbuminuria due to type 2 diabetes mellitus (HCC) 08/07/2019   Primary localized osteoarthritis of right hip 08/22/2018   Carpal tunnel syndrome, bilateral 08/06/2018   Type 2 diabetes mellitus with peripheral neuropathy (HCC) 08/06/2018   Degenerative joint disease of right hip 08/06/2018   Preoperative clearance 03/26/2018   Upper back pain on left side 10/30/2016   Encounter for postoperative carotid endarterectomy surveillance 12/07/2015   Varicosities of leg 06/03/2015   Acute left lumbar radiculopathy 04/15/2014   Chronic sciatica of right side 03/17/2013   Diabetic peripheral neuropathy (HCC) 03/17/2013   Coronary artery disease    Carotid artery stenosis s/p CEA 2013 10/16/2011   Carotid artery occlusion without infarction, right 09/17/2011   Carotid bruit 08/28/2011   Gout 04/13/2011   DM type 2 with diabetic peripheral neuropathy (HCC) 04/02/2008   Hyperlipidemia associated with type 2 diabetes mellitus (HCC) 04/02/2008   Essential hypertension 04/02/2008   DEGENERATIVE DISC DISEASE, LUMBOSACRAL SPINE 04/02/2008    PCP: Claudene Prentice DELENA Mickey., FNP  REFERRING PROVIDER: Beverley Evalene BIRCH, MD  THERAPY DIAG:  Pain in left hip  Muscle weakness  Unsteadiness on feet  REFERRING DIAG:  THA 5/20  Rationale for Evaluation and Treatment:  Rehabilitation  SUBJECTIVE:  PERTINENT PAST HISTORY:  DM II, gout, diabetic peripheral neuropathy (feet), CAD, hx of lumbar surgery, R THA         PRECAUTIONS: Fall  OP Date: 07/16/2023  2 weeks 07/30/2023  4 weeks 08/13/2023  6 weeks 08/27/2023  8 weeks 09/10/2023  10 weeks 09/24/2023  12 weeks 10/08/2023    WEIGHT BEARING RESTRICTIONS No  FALLS:  Has patient fallen in last 6 months? No, Number of falls: 0  MOI/History of condition:  Onset date: 5/20  SUBJECTIVE STATEMENT:   08/22/2023: Pt reports that her hip is doing  well. She report that she is mostly walking without AD at home.   EVAL: SOFIJA ANTWI is a 73 y.o. female who presents to clinic with chief complaint of L hip pain following L A THA on 5/20.  She reports she is sore but doing well.  She was using a walker and cane before surgery and would like to get back to using only the cane  She has a fairly sedentary lifestyle but her L hip pain got to the point that it was limiting her daily housework and ability to walk in the grocery store.  She has a boyfriend who can help at times but I can do it on my own, I don't want to rely on anyone else. 1 STE house.   Red flags:  denies malaise, erythema / streaking, and chills / night sweats  Pain:  Are you having pain? Yes Pain location: L hip  NPRS scale:  0/10 (last took pain pill at 10 am)  Aggravating factors: walking, standing, moving around Relieving factors: rest and medication Pain description: sharp, dull, and aching Stage: Acute 24 hour pattern: NA   Occupation: NA  Assistive Device: walker and cane  Hand Dominance: NA  Patient Goals/Specific Activities: improve function of hip, and walk with least restrictive AD   OBJECTIVE:   DIAGNOSTIC FINDINGS:  NA  GENERAL OBSERVATION/GAIT: Slow antalgic gait using FWW  SENSATION: Light touch: Deficits bil feet   LE MMT:  MMT Right (Eval) Left (Eval)  Hip flexion (L2, L3) 3+   Knee extension (L3) 4   Knee flexion 3+   Hip abduction    Hip extension    Hip external rotation 3+   Hip internal rotation 3+   Hip adduction    Ankle dorsiflexion (L4)    Ankle plantarflexion (S1)    Ankle inversion    Ankle eversion    Great Toe ext (L5)    Grossly     (Blank rows = not tested, score listed is out of 5 possible points.  N = WNL, D = diminished, C = clear for gross weakness with myotome testing, * = concordant pain with testing)  LE ROM:  ROM Right (Eval) Left (Eval) L  Hip flexion 110 90* 103  Hip extension     Hip  abduction     Hip adduction     Hip internal rotation     Hip external rotation     Knee extension     Knee flexion     Ankle dorsiflexion     Ankle plantarflexion     Ankle inversion     Ankle eversion      (Blank rows = not tested, N = WNL, * = concordant pain with testing)  Functional Tests  Eval 6/12   30''  STS: 6x  UE used? Y 8x w/ UE   10 m max gait speed: 23'', .43 m/s, AD: FWW                                                      PATIENT SURVEYS:  LEFS: 31/80   TODAY'S TREATMENT:  [facility] Adult PT Treatment:                                                DATE: 08/16/23 Therex:   Nu-step L5, 5 min Standing hip flexor stretch  Therapeutic Activity  STS - 3x5 chair w/ airex Lateral walking - w/ no UE support in // Fwd and retro walking in // with no UE support Step up and over 4'' step - 2x10 ea Step up - 4'' step - 2x10 ea ea - w/ ipsilateral support Obstacle course including walking on mat with cone slalom, cone step overs, and cone taps - CGA with SPC Checking goals    HOME EXERCISE PROGRAM: Access Code: [email or web address] URL: [email or web address] Date: 08/16/2023 Prepared by: Helene Gasmen  Exercises - Hooklying Clamshell with Resistance  - 1 x daily - 7 x weekly - 3 sets - 10 reps - Supine Hip Adduction Isometric with Ball  - 1 x daily - 7 x weekly - 2 sets - 10 reps - 10'' hold - Hip Flexor Stretch at Edge of Bed  - 2 x daily - 7 x weekly - 2 sets - 45 second hold - Sit to Stand Without Arm Support  - 1 x daily - 4-7 x weekly - 3 sets - 5 reps - Standing March with Counter Support  - 1 x daily - 7 x weekly - 3 sets - 5 reps  Treatment priorities   5/27 6/10       Hip ext ROM Hip ext during gait        CC strengthening        balance        gait                 ASSESSMENT:  CLINICAL IMPRESSION:  08/22/2023:  Upon goal recheck today pt has made significant progress toward all goals assessed.  She has met her pain goal  and made progress with LE strength testing, using LRAD, and 30'' STS.  She continues to have difficulty navigating unstable surfaces, STS from normal chair height, and general LE strength.  She feels that she is 70-80% of full function.  She has 2 remaining appts and feels she will likely be ready for D/C at that time.  EVAL: Hildur is a 73 y.o. female who presents to clinic with signs and sxs consistent with L hip pain and stiffness following direct anterior THA on 5/20.  Appears to be progressing as expected given time since surgery.  Provided HEP for some gentle hip exercises and ROM with instruction to hold clam at comfortable level with knees only slightly apart.  Craig will benefit from skilled PT to address relevant deficits to improve safety, comfort, and independence with daily activities and mobility.    OBJECTIVE IMPAIRMENTS: Pain, hip ROM, LE and hip strength, balance, gait  ACTIVITY LIMITATIONS:  standing, walking, transfers, bending, squatting, stairs, self care, community ambulation  PERSONAL FACTORS: See medical history and pertinent history   REHAB POTENTIAL: Good  CLINICAL DECISION MAKING: Evolving/moderate complexity  EVALUATION COMPLEXITY: Moderate   GOALS:   SHORT TERM GOALS: Target date: 08/16/2023   Scharlene will be >75% HEP compliant to improve carryover between sessions and facilitate independent management of condition  Evaluation: ongoing Goal status: MET   LONG TERM GOALS: Target date: 09/13/2023   Darene will self report >/= 50% decrease in pain from evaluation to improve function in daily tasks  Evaluation/Baseline: 9/10 max pain 6/26: 2/10 Goal status: MET   2.  Jenya will show a >/= 25 pt improvement in LEFS score (MCID is ~11% or 9 pts) as a proxy for functional improvement   Evaluation/Baseline: 31 pts Goal status: INITIAL   3.  Helayne will improve 30'' STS (MCID 2) to >/= 8x (w/ UE?: n) to show improved LE strength and improved  transfers   Evaluation/Baseline: 6x  w/ UE? Y 6/26: 6x from raised seat no UE Goal status: Ongoing   4.  Kaylan will improve 10 meter max gait speed to .8 m/s (.1 m/s MCID) to show functional improvement in ambulation   Evaluation/Baseline: .43 m/s w/ FWW Goal status: INITIAL   Norms:     5.  Kenzlei will improve the following MMTs to >/= 4/5 to show improvement in strength:     Evaluation/Baseline:  LE MMT:  MMT Right (Eval) Left (Eval) R/L 6/26  Hip flexion (L2, L3) 3+  3+/3+  Knee extension (L3) 4  4/4  Knee flexion 3+  4/4  Hip abduction     Hip extension     Hip external rotation 3+  4/4  Hip internal rotation 3+  4/4  Hip adduction     Ankle dorsiflexion (L4)     Ankle plantarflexion (S1)     Ankle inversion     Ankle eversion     Great Toe ext (L5)     Grossly      (Blank rows = not tested, score listed is out of 5 possible points.  N = WNL, D = diminished, C = clear for gross weakness with myotome testing, * = concordant pain with testing)  Goal status: Ongoing   6.  Meesha will be able to complete all need housework and shop with LRAD with confidence and not limited by pain  Evaluation/Baseline: limited 6/26: using cane for short trips and no AD in house Goal status: Ongoing   PLAN: PT FREQUENCY: 1-2x/week  PT DURATION: 8 weeks  PLANNED INTERVENTIONS:  [id number]- PT Re-evaluation, 97110-Therapeutic exercises, 97530- Therapeutic activity, 97112- Neuromuscular re-education, 97535- Self Care, [id number]- Manual therapy, [id number]- Gait training, [id number]- Aquatic Therapy, [phone]- Electrical stimulation (manual), 97016- Vasopneumatic device, Taping, Dry Needling, Joint manipulation, and Spinal manipulation.  Lilliona Blakeney E Bowie Delia PT 08/22/23  12:00 PM

## 2023-08-27 ENCOUNTER — Encounter: Payer: Self-pay | Admitting: Physical Therapy

## 2023-08-27 ENCOUNTER — Ambulatory Visit: Attending: Orthopedic Surgery | Admitting: Physical Therapy

## 2023-08-27 DIAGNOSIS — M6281 Muscle weakness (generalized): Secondary | ICD-10-CM | POA: Insufficient documentation

## 2023-08-27 DIAGNOSIS — M25552 Pain in left hip: Secondary | ICD-10-CM | POA: Diagnosis present

## 2023-08-27 NOTE — Therapy (Addendum)
 PHYSICAL THERAPY UNPLANNED DISCHARGE SUMMARY   Visits from Start of Care: 11  Current functional level related to goals / functional outcomes: Current status unknown   Remaining deficits: Current status unknown   Education / Equipment: Pt has not returned since visit listed below  Patient goals were not assessed. Patient is being discharged due to not returning since the last visit.  (the note below was addended to include the above D/C summary on 12/16/23)  OUTPATIENT PHYSICAL THERAPY DAILY NOTE  Patient Name: April Shaffer MRN: [id number] DOB:January 09, 1951, 73 y.o., female Today's Date: 08/27/2023   PT End of Session - 08/27/23 1154     Visit Number 11    Number of Visits --   1-2 x per week   Date for PT Re-Evaluation 09/13/23    Authorization Type [employer] MCR - LEFS    Authorization Time Period 07/19/23-08/31/23    Authorization - Visit Number 10    Authorization - Number of Visits 12    Progress Note Due on Visit 10    PT Start Time 1100    PT Stop Time 1142    PT Time Calculation (min) 42 min             Past Medical History:  Diagnosis Date   Anemia    Arthritis    Blood transfusion without reported diagnosis    Carotid artery narrowing    Coronary artery disease    Cardiac catheterization November 2013: 50% ostial LAD stenosis 50% mid stenosis. 30% disease in the left circumflex.   Diabetes mellitus, type 2 (HCC)    Hyperlipidemia    Hypertension    Onychomycosis of toenail 07/31/2016   PAD (peripheral artery disease) (HCC)    Sleep apnea       Had surgery to correct   Thyroid  nodule    Past Surgical History:  Procedure Laterality Date   ABDOMINAL AORTOGRAM W/LOWER EXTREMITY N/A 08/07/2021   Procedure: ABDOMINAL AORTOGRAM W/LOWER EXTREMITY;  Surgeon: Sheree Penne Bruckner, MD;  Location: [facility] INVASIVE CV LAB;  Service: Cardiovascular;  Laterality: N/A;   ABDOMINAL AORTOGRAM W/LOWER EXTREMITY Right 10/23/2021   Procedure: ABDOMINAL AORTOGRAM  W/LOWER EXTREMITY;  Surgeon: Sheree Penne Bruckner, MD;  Location: [facility] INVASIVE CV LAB;  Service: Cardiovascular;  Laterality: Right;   ABDOMINAL HYSTERECTOMY     APPLICATION OF INTRAOPERATIVE CT SCAN N/A 11/30/2020   Procedure: APPLICATION OF INTRAOPERATIVE CT SCAN;  Surgeon: Joshua Alm RAMAN, MD;  Location: [facility] OR;  Service: Neurosurgery;  Laterality: N/A;   BACK SURGERY     CARDIAC CATHETERIZATION     CATARACT EXTRACTION, BILATERAL Bilateral 2021   Dr. Milissa   ENDARTERECTOMY  09/27/2011   Procedure: RIGT ENDARTERECTOMY CAROTID;  Surgeon: Lynwood JONETTA Collum, MD;  Location: [facility] OR;  Service: Vascular;  Laterality: Right;   EPIDURAL BLOCK INJECTION  02/2008   Drs. Clydell Mince   EYE SURGERY Bilateral 2021   cataract   gyn surgery  2004   total hysterectomy for mennorhagia,,salpingoophorectomy   INCISION AND DRAINAGE ABSCESS Left 05/08/2022   Procedure: ARTHROSCOPIC INCISION AND DRAINAGE ABSCESS, DISTAL CLAVICLE EXCISSION, SUBACROMIAL DECOMPRESSION, LOOSE BODY REMOVAL, EXTENSIVE DEBRIDEMENT;  Surgeon: Beverley Evalene JONETTA, MD;  Location: [facility] ORS;  Service: Orthopedics;  Laterality: Left;   LAMINECTOMY WITH POSTERIOR LATERAL ARTHRODESIS LEVEL 2 N/A 11/30/2020   Procedure: LUMBAR FOUR-FIVE, LUMBAR FIVE-SACRAL ONE POSTERIOR LATERAL FUSION WITH REVISION OF LUMBAR ONE-FIVE HARDWARE AND EXTENSION TO SACRAL ONE AND SACRAL TWO;  Surgeon: Joshua Alm RAMAN, MD;  Location: [facility]  OR;  Service: Neurosurgery;  Laterality: N/A;   LUMBAR WOUND DEBRIDEMENT N/A 05/25/2020   Procedure: LUMBAR WOUND IRRIGATION AND DEBRIDEMENT;  Surgeon: Joshua Alm RAMAN, MD;  Location: [facility] OR;  Service: Neurosurgery;  Laterality: N/A;   PERIPHERAL VASCULAR INTERVENTION  08/07/2021   Procedure: PERIPHERAL VASCULAR INTERVENTION;  Surgeon: Sheree Penne Bruckner, MD;  Location: [facility] INVASIVE CV LAB;  Service: Cardiovascular;;  Rt SFA   PERIPHERAL VASCULAR INTERVENTION Right 10/23/2021   Procedure: PERIPHERAL VASCULAR INTERVENTION;   Surgeon: Sheree Penne Bruckner, MD;  Location: [facility] INVASIVE CV LAB;  Service: Cardiovascular;  Laterality: Right;  SFA   POSTERIOR LUMBAR FUSION 4 LEVEL N/A 04/25/2020   Procedure: POSTERIOR LUMBAR INTERBODY FUSION LUMBAR ONE-TWO, LUMBAR TWO-THREE, LUMBAR THREE-FOUR,LUMBAR FOUR-FIVE.;  Surgeon: Joshua Alm RAMAN, MD;  Location: [facility] OR;  Service: Neurosurgery;  Laterality: N/A;  posterior   SHOULDER ARTHROSCOPY Left 05/08/2022   Procedure: ARTHROSCOPY SHOULDER;  Surgeon: Beverley Evalene BIRCH, MD;  Location: [facility] ORS;  Service: Orthopedics;  Laterality: Left;   TONSILLECTOMY     TOTAL ABDOMINAL HYSTERECTOMY W/ BILATERAL SALPINGOOPHORECTOMY Bilateral 2000   TOTAL HIP ARTHROPLASTY Right 08/22/2018   Procedure: TOTAL HIP ARTHROPLASTY;  Surgeon: Shari Sieving, MD;  Location: [facility] ORS;  Service: Orthopedics;  Laterality: Right;   TOTAL HIP ARTHROPLASTY Left 07/16/2023   Procedure: ARTHROPLASTY, HIP, TOTAL, ANTERIOR APPROACH;  Surgeon: Beverley Evalene BIRCH, MD;  Location: [facility] ORS;  Service: Orthopedics;  Laterality: Left;   TRANSCAROTID ARTERY REVASCULARIZATION  Left 08/07/2022   Procedure: Transcarotid Artery Revascularization;  Surgeon: Sheree Penne Bruckner, MD;  Location: [facility] OR;  Service: Vascular;  Laterality: Left;   ULTRASOUND GUIDANCE FOR VASCULAR ACCESS Left 08/07/2022   Procedure: ULTRASOUND GUIDANCE FOR VASCULAR ACCESS;  Surgeon: Sheree Penne Bruckner, MD;  Location: [facility] OR;  Service: Vascular;  Laterality: Left;   VARICOSE VEIN SURGERY  2008   stripping   Patient Active Problem List   Diagnosis Date Noted   S/P total left hip arthroplasty 07/16/2023   Asymptomatic carotid artery stenosis without infarction, left 08/07/2022   Observation after surgery 08/07/2022   Septic arthritis of shoulder (HCC) 05/07/2022   Anemia of chronic disease 04/10/2022   Lumbar pseudoarthrosis 01/24/2022   Occlusion and stenosis of right carotid artery 01/24/2022   Iron  deficiency anemia secondary to blood loss  (chronic) 01/08/2022   B12 deficiency 08/31/2021   Medication monitoring encounter 12/12/2020   Nausea 12/12/2020   Hammer toes of both feet 11/09/2020   Acute maxillary sinusitis 08/08/2020   Hyperlipidemia 08/08/2020   Pain due to onychomycosis of toenails of both feet 08/08/2020   PICC (peripherally inserted central catheter) in place 06/22/2020   Therapeutic drug monitoring 06/22/2020   Wound infection after surgery 05/25/2020   Wound drainage 05/24/2020   Local infection of the skin and subcutaneous tissue, unspecified 05/24/2020   Body mass index (BMI) 27.0-27.9, adult 05/10/2020   S/P lumbar fusion 04/25/2020   Microalbuminuria due to type 2 diabetes mellitus (HCC) 08/07/2019   Primary localized osteoarthritis of right hip 08/22/2018   Carpal tunnel syndrome, bilateral 08/06/2018   Type 2 diabetes mellitus with peripheral neuropathy (HCC) 08/06/2018   Degenerative joint disease of right hip 08/06/2018   Preoperative clearance 03/26/2018   Upper back pain on left side 10/30/2016   Encounter for postoperative carotid endarterectomy surveillance 12/07/2015   Varicosities of leg 06/03/2015   Acute left lumbar radiculopathy 04/15/2014   Chronic sciatica of right side 03/17/2013   Diabetic peripheral neuropathy (HCC) 03/17/2013   Coronary artery disease  Carotid artery stenosis s/p CEA 2013 10/16/2011   Carotid artery occlusion without infarction, right 09/17/2011   Carotid bruit 08/28/2011   Gout 04/13/2011   DM type 2 with diabetic peripheral neuropathy (HCC) 04/02/2008   Hyperlipidemia associated with type 2 diabetes mellitus (HCC) 04/02/2008   Essential hypertension 04/02/2008   DEGENERATIVE DISC DISEASE, LUMBOSACRAL SPINE 04/02/2008    PCP: Claudene Prentice DELENA Mickey., FNP  REFERRING PROVIDER: Beverley Evalene BIRCH, MD  THERAPY DIAG:  Pain in left hip  Muscle weakness  REFERRING DIAG:  THA 5/20  Rationale for Evaluation and Treatment:   Rehabilitation  SUBJECTIVE:  PERTINENT PAST HISTORY:  DM II, gout, diabetic peripheral neuropathy (feet), CAD, hx of lumbar surgery, R THA         PRECAUTIONS: Fall  OP Date: 07/16/2023  2 weeks 07/30/2023  4 weeks 08/13/2023  6 weeks 08/27/2023  8 weeks 09/10/2023  10 weeks 09/24/2023  12 weeks 10/08/2023    WEIGHT BEARING RESTRICTIONS No  FALLS:  Has patient fallen in last 6 months? No, Number of falls: 0  MOI/History of condition:  Onset date: 5/20  SUBJECTIVE STATEMENT:   08/27/2023: Pt reports that her hip is doing well. Will see Beverley Millman for F/U for hip tomorrow, and the back next week. Concerned about a knot that has come up on her spine.   EVAL: TAYLYNN EASTON is a 73 y.o. female who presents to clinic with chief complaint of L hip pain following L A THA on 5/20.  She reports she is sore but doing well.  She was using a walker and cane before surgery and would like to get back to using only the cane  She has a fairly sedentary lifestyle but her L hip pain got to the point that it was limiting her daily housework and ability to walk in the grocery store.  She has a boyfriend who can help at times but I can do it on my own, I don't want to rely on anyone else. 1 STE house.   Red flags:  denies malaise, erythema / streaking, and chills / night sweats  Pain:  Are you having pain? Yes Pain location: L hip  NPRS scale:  0/10 (last took pain pill at 10 am)  Aggravating factors: walking, standing, moving around Relieving factors: rest and medication Pain description: sharp, dull, and aching Stage: Acute 24 hour pattern: NA   Occupation: NA  Assistive Device: walker and cane  Hand Dominance: NA  Patient Goals/Specific Activities: improve function of hip, and walk with least restrictive AD   OBJECTIVE:   DIAGNOSTIC FINDINGS:  NA  GENERAL OBSERVATION/GAIT: Slow antalgic gait using FWW  SENSATION: Light touch: Deficits bil feet   LE MMT:  MMT  Right (Eval) Left (Eval)  Hip flexion (L2, L3) 3+   Knee extension (L3) 4   Knee flexion 3+   Hip abduction    Hip extension    Hip external rotation 3+   Hip internal rotation 3+   Hip adduction    Ankle dorsiflexion (L4)    Ankle plantarflexion (S1)    Ankle inversion    Ankle eversion    Great Toe ext (L5)    Grossly     (Blank rows = not tested, score listed is out of 5 possible points.  N = WNL, D = diminished, C = clear for gross weakness with myotome testing, * = concordant pain with testing)  LE ROM:  ROM Right (Eval) Left (Eval) L  Hip flexion 110 90* 103  Hip extension     Hip abduction     Hip adduction     Hip internal rotation     Hip external rotation     Knee extension     Knee flexion     Ankle dorsiflexion     Ankle plantarflexion     Ankle inversion     Ankle eversion      (Blank rows = not tested, N = WNL, * = concordant pain with testing)  Functional Tests  Eval 6/12 08/27/23  30'' STS: 6x  UE used? Y 8x w/ UE   10 m max gait speed: 23'', .43 m/s, AD: FWW  15.3 sec, .65 m/s SPC                                                    PATIENT SURVEYS:  LEFS: 31/80   TODAY'S TREATMENT: [facility] Adult PT Treatment:                                                DATE: 08/27/23  Nustep L4 UE/LE x 5 minutes  Standing heel raises  Standing hip abduction Retro and lateral stepping along counter top Alternating step taps 5 x 2 without UE Seated LAQ red band 10 x 2 each  STS 5 x 2 mat table , needs UE  Seated clam RTB  Standing March  6 inch alternating step up 5 x 2 - fatigues in upper legs.  10 m gait speed test      [facility] Adult PT Treatment:                                                DATE: 08/16/23 Therex:   Nu-step L5, 5 min Standing hip flexor stretch  Therapeutic Activity  STS - 3x5 chair w/ airex Lateral walking - w/ no UE support in // Fwd and retro walking in // with no UE support Step up and over 4'' step - 2x10  ea Step up - 4'' step - 2x10 ea ea - w/ ipsilateral support Obstacle course including walking on mat with cone slalom, cone step overs, and cone taps - CGA with SPC Checking goals    HOME EXERCISE PROGRAM: Access Code: [email or web address] URL: [email or web address] Date: 08/16/2023 Prepared by: Helene Gasmen  Exercises - Hooklying Clamshell with Resistance  - 1 x daily - 7 x weekly - 3 sets - 10 reps - Supine Hip Adduction Isometric with Ball  - 1 x daily - 7 x weekly - 2 sets - 10 reps - 10'' hold - Hip Flexor Stretch at Edge of Bed  - 2 x daily - 7 x weekly - 2 sets - 45 second hold - Sit to Stand Without Arm Support  - 1 x daily - 4-7 x weekly - 3 sets - 5 reps - Standing March with Counter Support  - 1 x daily - 7 x weekly - 3 sets - 5 reps  Treatment priorities   5/27 6/10       Hip  ext ROM Hip ext during gait        CC strengthening        balance        gait                 ASSESSMENT:  CLINICAL IMPRESSION:  08/27/2023:  Pt has improved gait speed, not quite to goal. She arrives with [facility] today and has RW with her if needed. Rechecked LEFS which has improved by 40 points, meeting LTG. . Pt will see MD for follow up regarding hip tomorrow and regarding a lump on her lumbar spine next week. She would like to HOLD PT after this week to see what lumbar MD says about lumbar and if intervention is needed    EVAL: April Shaffer is a 73 y.o. female who presents to clinic with signs and sxs consistent with L hip pain and stiffness following direct anterior THA on 5/20.  Appears to be progressing as expected given time since surgery.  Provided HEP for some gentle hip exercises and ROM with instruction to hold clam at comfortable level with knees only slightly apart.  April Shaffer will benefit from skilled PT to address relevant deficits to improve safety, comfort, and independence with daily activities and mobility.    OBJECTIVE IMPAIRMENTS: Pain, hip ROM, LE and hip strength, balance,  gait  ACTIVITY LIMITATIONS: standing, walking, transfers, bending, squatting, stairs, self care, community ambulation  PERSONAL FACTORS: See medical history and pertinent history   REHAB POTENTIAL: Good  CLINICAL DECISION MAKING: Evolving/moderate complexity  EVALUATION COMPLEXITY: Moderate   GOALS:   SHORT TERM GOALS: Target date: 08/16/2023   Blondell will be >75% HEP compliant to improve carryover between sessions and facilitate independent management of condition  Evaluation: ongoing Goal status: MET   LONG TERM GOALS: Target date: 09/13/2023   Kehlani will self report >/= 50% decrease in pain from evaluation to improve function in daily tasks  Evaluation/Baseline: 9/10 max pain 6/26: 2/10 Goal status: MET   2.  Kaedance will show a >/= 25 pt improvement in LEFS score (MCID is ~11% or 9 pts) as a proxy for functional improvement   Evaluation/Baseline: 31 pts 08/27/23: 71/80 Goal status: MET   3.  Mellanie will improve 30'' STS (MCID 2) to >/= 8x (w/ UE?: n) to show improved LE strength and improved transfers   Evaluation/Baseline: 6x  w/ UE? Y 6/26: 6x from raised seat no UE Goal status: Ongoing   4.  Marimar will improve 10 meter max gait speed to .8 m/s (.1 m/s MCID) to show functional improvement in ambulation   Evaluation/Baseline: .43 m/s w/ FWW 08/27/23: .65 m/s w/SPC Goal status: ONGOING   Norms:     5.  Ignacia will improve the following MMTs to >/= 4/5 to show improvement in strength:     Evaluation/Baseline:  LE MMT:  MMT Right (Eval) Left (Eval) R/L 6/26  Hip flexion (L2, L3) 3+  3+/3+  Knee extension (L3) 4  4/4  Knee flexion 3+  4/4  Hip abduction     Hip extension     Hip external rotation 3+  4/4  Hip internal rotation 3+  4/4  Hip adduction     Ankle dorsiflexion (L4)     Ankle plantarflexion (S1)     Ankle inversion     Ankle eversion     Great Toe ext (L5)     Grossly      (Blank rows = not tested, score listed is out  of 5 possible points.  N = WNL, D = diminished, C = clear for gross weakness with myotome testing, * = concordant pain with testing)  Goal status: Ongoing   6.  Urvi will be able to complete all need housework and shop with LRAD with confidence and not limited by pain  Evaluation/Baseline: limited 6/26: using cane for short trips and no AD in house Goal status: Ongoing   PLAN: PT FREQUENCY: 1-2x/week  PT DURATION: 8 weeks  PLANNED INTERVENTIONS:  97164- PT Re-evaluation, 97110-Therapeutic exercises, 97530- Therapeutic activity, 97112- Neuromuscular re-education, 97535- Self Care, [id number]- Manual therapy, [id number]- Gait training, [id number]- Aquatic Therapy, [phone]- Electrical stimulation (manual), 97016- Vasopneumatic device, Taping, Dry Needling, Joint manipulation, and Spinal manipulation.  Harlene HERO Koleton Duchemin PTA 08/27/23  11:59 AM

## 2023-08-29 ENCOUNTER — Ambulatory Visit: Admitting: Physical Therapy

## 2023-09-04 ENCOUNTER — Other Ambulatory Visit: Payer: Self-pay | Admitting: Student

## 2023-09-04 ENCOUNTER — Encounter: Payer: Self-pay | Admitting: Nurse Practitioner

## 2023-09-04 ENCOUNTER — Encounter: Payer: Self-pay | Admitting: Student

## 2023-09-04 DIAGNOSIS — M4316 Spondylolisthesis, lumbar region: Secondary | ICD-10-CM

## 2023-09-06 ENCOUNTER — Ambulatory Visit
Admission: RE | Admit: 2023-09-06 | Discharge: 2023-09-06 | Disposition: A | Discharge status: Home / Self Care | Source: Ambulatory Visit | Attending: Student | Admitting: Student

## 2023-09-06 ENCOUNTER — Ambulatory Visit
Admission: RE | Admit: 2023-09-06 | Discharge: 2023-09-06 | Disposition: A | Discharge status: Home / Self Care | Source: Ambulatory Visit | Attending: Student

## 2023-09-06 DIAGNOSIS — M4316 Spondylolisthesis, lumbar region: Secondary | ICD-10-CM

## 2023-09-06 MED ORDER — GADOPICLENOL 0.5 MMOL/ML IV SOLN
5.0000 mL | Freq: Once | INTRAVENOUS | Status: AC | PRN
  Administered 2023-09-06: 5 mL via INTRAVENOUS

## 2023-09-10 ENCOUNTER — Emergency Department (HOSPITAL_COMMUNITY)
Admission: EM | Admit: 2023-09-10 | Discharge: 2023-09-10 | Discharge status: Against Medical Advice | Source: Home / Self Care

## 2023-09-10 ENCOUNTER — Other Ambulatory Visit: Payer: Self-pay

## 2023-09-10 ENCOUNTER — Encounter (HOSPITAL_COMMUNITY): Payer: Self-pay | Admitting: Pharmacy Technician

## 2023-09-10 DIAGNOSIS — Z5321 Procedure and treatment not carried out due to patient leaving prior to being seen by health care provider: Secondary | ICD-10-CM | POA: Insufficient documentation

## 2023-09-10 DIAGNOSIS — L02212 Cutaneous abscess of back [any part, except buttock]: Secondary | ICD-10-CM | POA: Insufficient documentation

## 2023-09-10 DIAGNOSIS — K6812 Psoas muscle abscess: Secondary | ICD-10-CM | POA: Diagnosis not present

## 2023-09-10 LAB — COMPREHENSIVE METABOLIC PANEL WITH GFR
ALT: 8 U/L (ref 0–44)
AST: 11 U/L — ABNORMAL LOW (ref 15–41)
Albumin: 2.2 g/dL — ABNORMAL LOW (ref 3.5–5.0)
Alkaline Phosphatase: 93 U/L (ref 38–126)
Anion gap: 10 (ref 5–15)
BUN: 12 mg/dL (ref 8–23)
CO2: 22 mmol/L (ref 22–32)
Calcium: 9 mg/dL (ref 8.9–10.3)
Chloride: 102 mmol/L (ref 98–111)
Creatinine, Ser: 1.04 mg/dL — ABNORMAL HIGH (ref 0.44–1.00)
GFR, Estimated: 57 mL/min — ABNORMAL LOW (ref >60)
Glucose, Bld: 151 mg/dL — ABNORMAL HIGH (ref 70–99)
Potassium: 3.1 mmol/L — ABNORMAL LOW (ref 3.5–5.1)
Sodium: 134 mmol/L — ABNORMAL LOW (ref 135–145)
Total Bilirubin: 0.4 mg/dL (ref 0.0–1.2)
Total Protein: 9.6 g/dL — ABNORMAL HIGH (ref 6.5–8.1)

## 2023-09-10 LAB — CBC WITH DIFFERENTIAL/PLATELET
Abs Immature Granulocytes: 0.11 K/uL — ABNORMAL HIGH (ref 0.00–0.07)
Basophils Absolute: 0.1 K/uL (ref 0.0–0.1)
Basophils Relative: 0 %
Eosinophils Absolute: 0 K/uL (ref 0.0–0.5)
Eosinophils Relative: 0 %
HCT: 27 % — ABNORMAL LOW (ref 36.0–46.0)
Hemoglobin: 8.2 g/dL — ABNORMAL LOW (ref 12.0–15.0)
Immature Granulocytes: 1 %
Lymphocytes Relative: 16 %
Lymphs Abs: 1.9 K/uL (ref 0.7–4.0)
MCH: 24.6 pg — ABNORMAL LOW (ref 26.0–34.0)
MCHC: 30.4 g/dL (ref 30.0–36.0)
MCV: 81.1 fL (ref 80.0–100.0)
Monocytes Absolute: 0.7 K/uL (ref 0.1–1.0)
Monocytes Relative: 6 %
Neutro Abs: 9.3 K/uL — ABNORMAL HIGH (ref 1.7–7.7)
Neutrophils Relative %: 77 %
Platelets: 484 K/uL — ABNORMAL HIGH (ref 150–400)
RBC: 3.33 MIL/uL — ABNORMAL LOW (ref 3.87–5.11)
RDW: 15.8 % — ABNORMAL HIGH (ref 11.5–15.5)
WBC: 12.1 K/uL — ABNORMAL HIGH (ref 4.0–10.5)
nRBC: 0 % (ref 0.0–0.2)

## 2023-09-10 LAB — I-STAT CG4 LACTIC ACID, ED: Lactic Acid, Venous: 1.7 mmol/L (ref 0.5–1.9)

## 2023-09-10 LAB — C-REACTIVE PROTEIN: CRP: 12.2 mg/dL — ABNORMAL HIGH (ref <1.0)

## 2023-09-10 LAB — SEDIMENTATION RATE: Sed Rate: 138 mm/h — ABNORMAL HIGH (ref 0–22)

## 2023-09-10 NOTE — ED Triage Notes (Signed)
 Pt sent here for evaluation of abscess to back. Denies pain.

## 2023-09-10 NOTE — ED Notes (Signed)
 Pt states that she is leaving and will go to [employer].

## 2023-09-10 NOTE — ED Provider Triage Note (Signed)
 Emergency Medicine Provider Triage Evaluation Note  DELLENE MCGROARTY , a 73 y.o. female  was evaluated in triage.  Pt complains of back lesion. Seen by Dr. Joshua from neurology who tried to I&D her back in office.  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  She comes in today as Dr. Joshua was unable to I&D the lesion on her back.  He is recommended ESR, CRP, blood cultures.  Patient reports no pain to the area, no difficulty ambulating. No IVDU.   Review of Systems  Positive: wound Negative: fever  Physical Exam  BP 122/61 (BP Location: Right Arm)   Pulse 80   Temp 98.1 F (36.7 C)   Resp 18   SpO2 100%  Gen:   Awake, no distress   Resp:  Normal effort  MSK:   Moves extremities without difficulty  Other:    Medical Decision Making  Medically screening exam initiated at 12:23 PM.  Appropriate orders placed.  MAYAR WHITTIER was informed that the remainder of the evaluation will be completed by another provider, this initial triage assessment does not replace that evaluation, and the importance of remaining in the ED until their evaluation is complete.     Dystany Duffy, PA-C 09/10/23 1225

## 2023-09-11 ENCOUNTER — Other Ambulatory Visit: Payer: Self-pay

## 2023-09-11 ENCOUNTER — Inpatient Hospital Stay (HOSPITAL_COMMUNITY)
Admission: EM | Admit: 2023-09-11 | Discharge: 2023-09-14 | DRG: 372 | Disposition: A | Discharge status: Home Health | Source: Ambulatory Visit | Attending: Family Medicine | Admitting: Family Medicine

## 2023-09-11 ENCOUNTER — Observation Stay (HOSPITAL_COMMUNITY)

## 2023-09-11 ENCOUNTER — Encounter (HOSPITAL_COMMUNITY): Payer: Self-pay | Admitting: Internal Medicine

## 2023-09-11 DIAGNOSIS — E1151 Type 2 diabetes mellitus with diabetic peripheral angiopathy without gangrene: Secondary | ICD-10-CM | POA: Diagnosis present

## 2023-09-11 DIAGNOSIS — Z9841 Cataract extraction status, right eye: Secondary | ICD-10-CM

## 2023-09-11 DIAGNOSIS — Z79899 Other long term (current) drug therapy: Secondary | ICD-10-CM

## 2023-09-11 DIAGNOSIS — E876 Hypokalemia: Secondary | ICD-10-CM | POA: Diagnosis present

## 2023-09-11 DIAGNOSIS — Z7984 Long term (current) use of oral hypoglycemic drugs: Secondary | ICD-10-CM

## 2023-09-11 DIAGNOSIS — Z8614 Personal history of Methicillin resistant Staphylococcus aureus infection: Secondary | ICD-10-CM

## 2023-09-11 DIAGNOSIS — I10 Essential (primary) hypertension: Secondary | ICD-10-CM | POA: Diagnosis present

## 2023-09-11 DIAGNOSIS — Z96642 Presence of left artificial hip joint: Secondary | ICD-10-CM | POA: Diagnosis present

## 2023-09-11 DIAGNOSIS — M6289 Other specified disorders of muscle: Principal | ICD-10-CM

## 2023-09-11 DIAGNOSIS — F17210 Nicotine dependence, cigarettes, uncomplicated: Secondary | ICD-10-CM | POA: Diagnosis present

## 2023-09-11 DIAGNOSIS — E1142 Type 2 diabetes mellitus with diabetic polyneuropathy: Secondary | ICD-10-CM | POA: Diagnosis present

## 2023-09-11 DIAGNOSIS — E785 Hyperlipidemia, unspecified: Secondary | ICD-10-CM | POA: Diagnosis present

## 2023-09-11 DIAGNOSIS — Z981 Arthrodesis status: Secondary | ICD-10-CM

## 2023-09-11 DIAGNOSIS — L02212 Cutaneous abscess of back [any part, except buttock]: Secondary | ICD-10-CM | POA: Diagnosis present

## 2023-09-11 DIAGNOSIS — Z9842 Cataract extraction status, left eye: Secondary | ICD-10-CM

## 2023-09-11 DIAGNOSIS — T847XXA Infection and inflammatory reaction due to other internal orthopedic prosthetic devices, implants and grafts, initial encounter: Secondary | ICD-10-CM

## 2023-09-11 DIAGNOSIS — R188 Other ascites: Principal | ICD-10-CM

## 2023-09-11 DIAGNOSIS — Z885 Allergy status to narcotic agent status: Secondary | ICD-10-CM

## 2023-09-11 DIAGNOSIS — M4620 Osteomyelitis of vertebra, site unspecified: Secondary | ICD-10-CM

## 2023-09-11 DIAGNOSIS — Z8249 Family history of ischemic heart disease and other diseases of the circulatory system: Secondary | ICD-10-CM

## 2023-09-11 DIAGNOSIS — D649 Anemia, unspecified: Secondary | ICD-10-CM | POA: Diagnosis present

## 2023-09-11 DIAGNOSIS — Z90710 Acquired absence of both cervix and uterus: Secondary | ICD-10-CM

## 2023-09-11 DIAGNOSIS — K6812 Psoas muscle abscess: Principal | ICD-10-CM | POA: Diagnosis present

## 2023-09-11 DIAGNOSIS — I700 Atherosclerosis of aorta: Secondary | ICD-10-CM | POA: Diagnosis present

## 2023-09-11 DIAGNOSIS — Z7902 Long term (current) use of antithrombotics/antiplatelets: Secondary | ICD-10-CM

## 2023-09-11 DIAGNOSIS — G4730 Sleep apnea, unspecified: Secondary | ICD-10-CM | POA: Diagnosis present

## 2023-09-11 DIAGNOSIS — Z01818 Encounter for other preprocedural examination: Secondary | ICD-10-CM

## 2023-09-11 DIAGNOSIS — Z96643 Presence of artificial hip joint, bilateral: Secondary | ICD-10-CM | POA: Diagnosis present

## 2023-09-11 DIAGNOSIS — I2510 Atherosclerotic heart disease of native coronary artery without angina pectoris: Secondary | ICD-10-CM | POA: Diagnosis present

## 2023-09-11 DIAGNOSIS — Z794 Long term (current) use of insulin: Secondary | ICD-10-CM

## 2023-09-11 DIAGNOSIS — Z833 Family history of diabetes mellitus: Secondary | ICD-10-CM

## 2023-09-11 DIAGNOSIS — G4733 Obstructive sleep apnea (adult) (pediatric): Secondary | ICD-10-CM | POA: Diagnosis present

## 2023-09-11 LAB — CBC WITH DIFFERENTIAL/PLATELET
Abs Immature Granulocytes: 0.1 K/uL — ABNORMAL HIGH (ref 0.00–0.07)
Basophils Absolute: 0.1 K/uL (ref 0.0–0.1)
Basophils Relative: 0 %
Eosinophils Absolute: 0.1 K/uL (ref 0.0–0.5)
Eosinophils Relative: 1 %
HCT: 27.1 % — ABNORMAL LOW (ref 36.0–46.0)
Hemoglobin: 8.1 g/dL — ABNORMAL LOW (ref 12.0–15.0)
Immature Granulocytes: 1 %
Lymphocytes Relative: 21 %
Lymphs Abs: 2.6 K/uL (ref 0.7–4.0)
MCH: 24.5 pg — ABNORMAL LOW (ref 26.0–34.0)
MCHC: 29.9 g/dL — ABNORMAL LOW (ref 30.0–36.0)
MCV: 81.9 fL (ref 80.0–100.0)
Monocytes Absolute: 0.8 K/uL (ref 0.1–1.0)
Monocytes Relative: 6 %
Neutro Abs: 8.5 K/uL — ABNORMAL HIGH (ref 1.7–7.7)
Neutrophils Relative %: 71 %
Platelets: 500 K/uL — ABNORMAL HIGH (ref 150–400)
RBC: 3.31 MIL/uL — ABNORMAL LOW (ref 3.87–5.11)
RDW: 15.9 % — ABNORMAL HIGH (ref 11.5–15.5)
WBC: 12 K/uL — ABNORMAL HIGH (ref 4.0–10.5)
nRBC: 0 % (ref 0.0–0.2)

## 2023-09-11 LAB — COMPREHENSIVE METABOLIC PANEL WITH GFR
ALT: 6 U/L (ref 0–44)
AST: 11 U/L — ABNORMAL LOW (ref 15–41)
Albumin: 2.4 g/dL — ABNORMAL LOW (ref 3.5–5.0)
Alkaline Phosphatase: 93 U/L (ref 38–126)
Anion gap: 9 (ref 5–15)
BUN: 16 mg/dL (ref 8–23)
CO2: 23 mmol/L (ref 22–32)
Calcium: 8.7 mg/dL — ABNORMAL LOW (ref 8.9–10.3)
Chloride: 103 mmol/L (ref 98–111)
Creatinine, Ser: 0.97 mg/dL (ref 0.44–1.00)
GFR, Estimated: >60 mL/min (ref >60)
Glucose, Bld: 131 mg/dL — ABNORMAL HIGH (ref 70–99)
Potassium: 3.3 mmol/L — ABNORMAL LOW (ref 3.5–5.1)
Sodium: 135 mmol/L (ref 135–145)
Total Bilirubin: 0.5 mg/dL (ref 0.0–1.2)
Total Protein: 9.2 g/dL — ABNORMAL HIGH (ref 6.5–8.1)

## 2023-09-11 LAB — I-STAT CG4 LACTIC ACID, ED: Lactic Acid, Venous: 1.7 mmol/L (ref 0.5–1.9)

## 2023-09-11 MED ORDER — GABAPENTIN 300 MG PO CAPS
600.0000 mg | ORAL_CAPSULE | Freq: Three times a day (TID) | ORAL | Status: DC
  Administered 2023-09-11 – 2023-09-14 (×8): 600 mg via ORAL
  Filled 2023-09-11 (×8): qty 2

## 2023-09-11 MED ORDER — ACETAMINOPHEN 650 MG RE SUPP: 650.0000 mg | Freq: Four times a day (QID) | RECTAL | Status: DC | PRN

## 2023-09-11 MED ORDER — CYANOCOBALAMIN 1000 MCG/ML IJ SOLN: 1000.0000 ug | INTRAMUSCULAR | Status: DC

## 2023-09-11 MED ORDER — METHOCARBAMOL 500 MG PO TABS
750.0000 mg | ORAL_TABLET | Freq: Three times a day (TID) | ORAL | Status: DC | PRN
  Administered 2023-09-12: 750 mg via ORAL
  Filled 2023-09-11: qty 2

## 2023-09-11 MED ORDER — SODIUM CHLORIDE 0.9 % IV SOLN
250.0000 mL | INTRAVENOUS | Status: AC | PRN
Start: 2023-09-11 — End: 2023-09-12

## 2023-09-11 MED ORDER — IOHEXOL 300 MG/ML  SOLN
100.0000 mL | Freq: Once | INTRAMUSCULAR | Status: AC | PRN
  Administered 2023-09-11: 100 mL via INTRAVENOUS

## 2023-09-11 MED ORDER — POTASSIUM CHLORIDE CRYS ER 20 MEQ PO TBCR
40.0000 meq | EXTENDED_RELEASE_TABLET | Freq: Once | ORAL | Status: AC
  Administered 2023-09-11: 40 meq via ORAL
  Filled 2023-09-11: qty 2

## 2023-09-11 MED ORDER — METOPROLOL SUCCINATE ER 25 MG PO TB24
25.0000 mg | ORAL_TABLET | Freq: Every day | ORAL | Status: DC
  Administered 2023-09-11 – 2023-09-14 (×3): 25 mg via ORAL
  Filled 2023-09-11 (×3): qty 1

## 2023-09-11 MED ORDER — SENNOSIDES-DOCUSATE SODIUM 8.6-50 MG PO TABS: 2.0000 | ORAL_TABLET | Freq: Every day | ORAL | Status: DC | PRN

## 2023-09-11 MED ORDER — HYDRALAZINE HCL 20 MG/ML IJ SOLN: 5.0000 mg | Freq: Four times a day (QID) | INTRAMUSCULAR | Status: DC | PRN

## 2023-09-11 MED ORDER — SODIUM CHLORIDE 0.9% FLUSH: 3.0000 mL | INTRAVENOUS | Status: DC | PRN

## 2023-09-11 MED ORDER — ACETAMINOPHEN 325 MG PO TABS: 650.0000 mg | ORAL_TABLET | Freq: Four times a day (QID) | ORAL | Status: DC | PRN

## 2023-09-11 MED ORDER — HYDROCODONE-ACETAMINOPHEN 5-325 MG PO TABS
1.0000 | ORAL_TABLET | ORAL | Status: DC | PRN
  Administered 2023-09-12 (×2): 2 via ORAL
  Administered 2023-09-12: 1 via ORAL
  Administered 2023-09-13 – 2023-09-14 (×3): 2 via ORAL
  Filled 2023-09-11: qty 1
  Filled 2023-09-11 (×5): qty 2

## 2023-09-11 MED ORDER — ONDANSETRON HCL 4 MG PO TABS
4.0000 mg | ORAL_TABLET | Freq: Three times a day (TID) | ORAL | Status: DC | PRN
  Filled 2023-09-11: qty 1

## 2023-09-11 MED ORDER — SODIUM CHLORIDE 0.9% FLUSH
3.0000 mL | Freq: Two times a day (BID) | INTRAVENOUS | Status: DC
  Administered 2023-09-11 – 2023-09-14 (×5): 3 mL via INTRAVENOUS

## 2023-09-11 MED ORDER — SODIUM CHLORIDE 0.9 % IV SOLN: INTRAVENOUS | Status: AC

## 2023-09-11 MED ORDER — POLYETHYLENE GLYCOL 3350 17 G PO PACK
17.0000 g | PACK | Freq: Every day | ORAL | Status: DC
  Administered 2023-09-11: 17 g via ORAL
  Filled 2023-09-11 (×3): qty 1

## 2023-09-11 NOTE — H&P (Addendum)
 [facility] History and Physical  April Shaffer FMW:[id number] DOB: [birth year] DOA: 09/11/2023  Referring physician: ED  PCP: April Shaffer April Mickey., FNP   Patient is coming from: Home  Chief Complaint: Lump in the back  HPI:  Patient is a 73 years old female with past medical history of anemia, carotid artery disease, CAD, diabetes mellitus, hypertension, hyperlipidemia and sleep apnea, history of recent total hip arthroplasty on 07/16/2023 presented to the hospital with concerns for lump in the back soon on the MRI.April Shaffer  Patient stated that she was feeling weak with nausea and felt a bump on the backside of the body where she had spinal surgery in the past with Dr. Joshua.  Had seen Dr. Joshua as outpatient and attempted aspiration was unsuccessful as per the patient.  Dr. Joshua  had ordered MRI scan of the back on 09/06/2023 which showed operative changes from decompression and fusion with fluid collection in the right psoas muscle 9-10 cm with some prevertebral fluid collection at the L4-L5 which might represent postoperative hematoma versus seroma..  Patient then was referred to our hospital for further evaluation with interventional radiology with aspiration of the fluid findings .  Patient however denies overt pain on the back except for mild discomfort.  Denies any dizziness lightheadedness syncope but has generalized fatigue and weakness.  Denies any vomiting diarrhea.  Denies any urinary urgency frequency or dysuria.  Denies any focal weakness in her legs or any hip pain.  No mention of shortness of breath dyspnea cough congestion.   Assessment and Plan Principal Problem:   Psoas mass Active Problems:   DM type 2 with diabetic peripheral neuropathy (HCC)   Essential hypertension   S/P lumbar fusion   S/P total left hip arthroplasty  Right psoas muscle fluid collection.  Possibility of hematoma versus seroma.  History of neurosurgical intervention in the past.  Seen by Dr. Joshua as  outpatient.  Unable to rule out abscess.  Has mild leukocytosis.  Patient has been admitted for IR guided aspiration of fluid.  We will hold off with antibiotics for now until we have culture report back.  Continue to monitor CBC.  Plan for CT scan of the abdomen and pelvis as well for better delineation of the lump..  On Plavix  at home will hold for now due to anticipated procedure..  Continue gabapentin  and Robaxin .  Will likely need PT OT evaluation after IR intervention.  Follow-up blood culture sent from the ED.  Patient follows up with Dr. Joshua neurosurgery as outpatient.  Plan was IR guided aspiration.  Plan is to touch base with neurosurgery after aspiration.  Diabetes mellitus type 2 Patient is on metformin  at home.  Will continue sliding scale insulin , diabetic diet while in the hospital.  Continue to monitor.  Hemoglobin A1c within the last 3 months was 6.7.  Essential hypertension On hydrochlorothiazide  and metoprolol  at home.  Will hold off with HCTZ due to hypokalemia.  Continue metoprolol .  Add as needed hydralazine .  Hypokalemia.  Will hold off with HCTZ.  Will replace orally with 40 mill equivalents of potassium today...  Check levels in AM.  Hyperlipidemia Not on statins.  Sleep apnea History of status he does not have it anymore.  Not on CPAP.  DVT Prophylaxis: Hold until IR intervention  Review of Systems:  All systems were reviewed and were negative unless otherwise mentioned in the HPI   Past Medical History:  Diagnosis Date   Anemia    Arthritis  Blood transfusion without reported diagnosis    Carotid artery narrowing    Coronary artery disease    Cardiac catheterization November 2013: 50% ostial LAD stenosis 50% mid stenosis. 30% disease in the left circumflex.   Diabetes mellitus, type 2 (HCC)    Hyperlipidemia    Hypertension    Onychomycosis of toenail 07/31/2016   PAD (peripheral artery disease) (HCC)    Sleep apnea       Had surgery to correct    Thyroid  nodule    Past Surgical History:  Procedure Laterality Date   ABDOMINAL AORTOGRAM W/LOWER EXTREMITY N/A 08/07/2021   Procedure: ABDOMINAL AORTOGRAM W/LOWER EXTREMITY;  Surgeon: April Penne Bruckner, MD;  Location: [facility] INVASIVE CV LAB;  Service: Cardiovascular;  Laterality: N/A;   ABDOMINAL AORTOGRAM W/LOWER EXTREMITY Right 10/23/2021   Procedure: ABDOMINAL AORTOGRAM W/LOWER EXTREMITY;  Surgeon: April Penne Bruckner, MD;  Location: [facility] INVASIVE CV LAB;  Service: Cardiovascular;  Laterality: Right;   ABDOMINAL HYSTERECTOMY     APPLICATION OF INTRAOPERATIVE CT SCAN N/A 11/30/2020   Procedure: APPLICATION OF INTRAOPERATIVE CT SCAN;  Surgeon: April Alm RAMAN, MD;  Location: [facility] OR;  Service: Neurosurgery;  Laterality: N/A;   BACK SURGERY     CARDIAC CATHETERIZATION     CATARACT EXTRACTION, BILATERAL Bilateral 2021   April Shaffer   ENDARTERECTOMY  09/27/2011   Procedure: RIGT ENDARTERECTOMY CAROTID;  Surgeon: April Shaffer Collum, MD;  Location: [facility] OR;  Service: Vascular;  Laterality: Right;   EPIDURAL BLOCK INJECTION  02/2008   Drs. April Shaffer   EYE SURGERY Bilateral 2021   cataract   gyn surgery  2004   total hysterectomy for mennorhagia,,salpingoophorectomy   INCISION AND DRAINAGE ABSCESS Left 05/08/2022   Procedure: ARTHROSCOPIC INCISION AND DRAINAGE ABSCESS, DISTAL CLAVICLE EXCISSION, SUBACROMIAL DECOMPRESSION, LOOSE BODY REMOVAL, EXTENSIVE DEBRIDEMENT;  Surgeon: April Evalene JONETTA, MD;  Location: [facility] ORS;  Service: Orthopedics;  Laterality: Left;   LAMINECTOMY WITH POSTERIOR LATERAL ARTHRODESIS LEVEL 2 N/A 11/30/2020   Procedure: LUMBAR FOUR-FIVE, LUMBAR FIVE-SACRAL ONE POSTERIOR LATERAL FUSION WITH REVISION OF LUMBAR ONE-FIVE HARDWARE AND EXTENSION TO SACRAL ONE AND SACRAL TWO;  Surgeon: April Alm RAMAN, MD;  Location: [facility] OR;  Service: Neurosurgery;  Laterality: N/A;   LUMBAR WOUND DEBRIDEMENT N/A 05/25/2020   Procedure: LUMBAR WOUND IRRIGATION AND DEBRIDEMENT;  Surgeon:  April Alm RAMAN, MD;  Location: [facility] OR;  Service: Neurosurgery;  Laterality: N/A;   PERIPHERAL VASCULAR INTERVENTION  08/07/2021   Procedure: PERIPHERAL VASCULAR INTERVENTION;  Surgeon: April Penne Bruckner, MD;  Location: [facility] INVASIVE CV LAB;  Service: Cardiovascular;;  Rt SFA   PERIPHERAL VASCULAR INTERVENTION Right 10/23/2021   Procedure: PERIPHERAL VASCULAR INTERVENTION;  Surgeon: April Penne Bruckner, MD;  Location: [facility] INVASIVE CV LAB;  Service: Cardiovascular;  Laterality: Right;  SFA   POSTERIOR LUMBAR FUSION 4 LEVEL N/A 04/25/2020   Procedure: POSTERIOR LUMBAR INTERBODY FUSION LUMBAR ONE-TWO, LUMBAR TWO-THREE, LUMBAR THREE-FOUR,LUMBAR FOUR-FIVE.;  Surgeon: April Alm RAMAN, MD;  Location: [facility] OR;  Service: Neurosurgery;  Laterality: N/A;  posterior   SHOULDER ARTHROSCOPY Left 05/08/2022   Procedure: ARTHROSCOPY SHOULDER;  Surgeon: April Evalene JONETTA, MD;  Location: [facility] ORS;  Service: Orthopedics;  Laterality: Left;   TONSILLECTOMY     TOTAL ABDOMINAL HYSTERECTOMY W/ BILATERAL SALPINGOOPHORECTOMY Bilateral 2000   TOTAL HIP ARTHROPLASTY Right 08/22/2018   Procedure: TOTAL HIP ARTHROPLASTY;  Surgeon: Shari Sieving, MD;  Location: [facility] ORS;  Service: Orthopedics;  Laterality: Right;   TOTAL HIP ARTHROPLASTY Left 07/16/2023   Procedure: ARTHROPLASTY, HIP, TOTAL, ANTERIOR APPROACH;  Surgeon: April Evalene BIRCH, MD;  Location: [facility] ORS;  Service: Orthopedics;  Laterality: Left;   TRANSCAROTID ARTERY REVASCULARIZATION  Left 08/07/2022   Procedure: Transcarotid Artery Revascularization;  Surgeon: April Penne Bruckner, MD;  Location: [facility] OR;  Service: Vascular;  Laterality: Left;   ULTRASOUND GUIDANCE FOR VASCULAR ACCESS Left 08/07/2022   Procedure: ULTRASOUND GUIDANCE FOR VASCULAR ACCESS;  Surgeon: April Penne Bruckner, MD;  Location: [facility] OR;  Service: Vascular;  Laterality: Left;   VARICOSE VEIN SURGERY  2008   stripping    Social History:  reports that she has been smoking cigarettes.  She started smoking about 55 years ago. She has a 13.8 pack-year smoking history. She has never been exposed to tobacco smoke. She has never used smokeless tobacco. She reports that she does not currently use alcohol after a past usage of about 14.0 standard drinks of alcohol per week. She reports that she does not use drugs.  Allergies  Allergen Reactions   Zestoretic  [Lisinopril -Hydrochlorothiazide ] Swelling    Angioedema  - Tongue swelling    Codeine Itching   Oxycodone  Itching    Patient is taking at this time   Ultram  [Tramadol ] Itching   Zocor  [Simvastatin ] Other (See Comments)    Muscle pain    Family History  Problem Relation Age of Onset   Hypertension Sister    Hypothyroidism Sister    Cancer Maternal Grandmother        lung   Hypertension Son    Hypertension Sister    Diabetes Brother        lost toe in 2018 with new diagnosis of DM   Hypertension Son      Prior to Admission medications   Medication Sig Start Date End Date Taking? Authorizing Provider  acetaminophen  (TYLENOL ) 500 MG tablet Take 500 mg by mouth every 6 (six) hours as needed for moderate pain.    [provider]  allopurinol  (ZYLOPRIM ) 300 MG tablet Take 1 tablet (300 mg total) by mouth daily. 07/09/19   Adella Norris, MD  amLODipine  (NORVASC ) 10 MG tablet Take 10 mg by mouth in the morning. 06/18/23   [provider]  aspirin  EC 81 MG tablet Take 1 tablet (81 mg total) by mouth 2 (two) times daily. To prevent blood clots for 30 days after surgery. 07/17/23   Gawne, Meghan M, PA-C  atorvastatin  (LIPITOR ) 80 MG tablet Take 80 mg by mouth in the morning.    [provider]  carvedilol  (COREG  CR) 10 MG 24 hr capsule Take 10 mg by mouth in the morning. 05/11/23   [provider]  clopidogrel  (PLAVIX ) 75 MG tablet Take 75 mg by mouth in the morning.    [provider]  cyanocobalamin  (VITAMIN B12) 1000 MCG/ML injection Inject 1,000 mcg into the muscle every 30  (thirty) days.    [provider]  ezetimibe  (ZETIA ) 10 MG tablet TAKE 1 TABLET EVERY DAY (NEED MD APPOINTMENT) Patient taking differently: Take 10 mg by mouth daily. 09/27/20   Court Dorn PARAS, MD  ferrous sulfate  325 (65 FE) MG tablet Take 325 mg by mouth in the morning.    [provider]  gabapentin  (NEURONTIN ) 300 MG capsule Take 300 mg by mouth 3 (three) times daily. 06/09/21   [provider]  glucose blood test strip Check sugars twice daily 01/31/16   Adella Norris, MD  hydrochlorothiazide  (HYDRODIURIL ) 12.5 MG tablet Take 12.5 mg by mouth in the morning.    [provider]  metFORMIN  (GLUCOPHAGE -XR)  750 MG 24 hr tablet Take 750 mg by mouth in the morning. 06/07/23   [provider]  methocarbamol  (ROBAXIN ) 750 MG tablet Take 1 tablet (750 mg total) by mouth every 8 (eight) hours as needed for muscle spasms. 07/17/23   Gawne, Meghan M, PA-C  metoprolol  tartrate (LOPRESSOR ) 25 MG tablet Take 25 mg by mouth daily.    [provider]  ondansetron  (ZOFRAN ) 4 MG tablet Take 4 mg by mouth every 8 (eight) hours as needed for nausea or vomiting. 05/20/23   [provider]  ondansetron  (ZOFRAN -ODT) 4 MG disintegrating tablet Take 1 tablet (4 mg total) by mouth every 8 (eight) hours as needed for nausea or vomiting. 07/17/23   Ted Gerard HERO, PA-C  oxyCODONE  (ROXICODONE ) 5 MG immediate release tablet Take 1 tablet (5 mg total) by mouth every 4 (four) hours as needed for severe pain (pain score 7-10) or breakthrough pain. after hip surgery that is not controlled with your normal daily dose of Percocet for chronic pain 07/17/23   Gawne, Meghan M, PA-C  oxyCODONE -acetaminophen  (PERCOCET) 10-325 MG tablet Take 1 tablet by mouth every 4 (four) hours as needed for pain. Patient taking differently: Take 1 tablet by mouth every 6 (six) hours as needed for pain. 12/03/20   April Alm Hamilton, MD  sennosides-docusate sodium  (SENOKOT-S) 8.6-50 MG  tablet Take 2 tablets by mouth daily as needed for constipation (while taking narcotics). 07/17/23   Gawne, Meghan M, PA-C    Physical Exam:  Vitals:   09/11/23 1219 09/11/23 1225 09/11/23 1615  BP: 111/62  136/61  Pulse: 86  68  Resp: 16  16  Temp: 97.8 F (36.6 C)  98 F (36.7 C)  TempSrc: Oral  Oral  SpO2: 100%  100%  Weight:  51.3 kg   Height:  5' 3 (1.6 m)    Wt Readings from Last 3 Encounters:  09/11/23 51.3 kg  07/16/23 58.1 kg  07/08/23 58.1 kg   Body mass index is 20.02 kg/m.  General: Thinly built, not in obvious distress HENT: Normocephalic, No scleral pallor or icterus noted. Oral mucosa is moist.  Chest:  Clear breath sounds.  . No crackles or wheezes.  CVS: S1 &S2 heard. No murmur.  Regular rate and rhythm. Abdomen: Soft, nontender, nondistended.  Bowel sounds are heard. No abdominal mass palpated Lump over the midline and towards the right side of the lower back on palpation. Extremities: No cyanosis, clubbing or edema.  Peripheral pulses are palpable.  None tenderness over the hip Psych: Alert, awake and oriented, normal mood CNS:  No cranial nerve deficits.  Power equal in all extremities.   Skin: Warm and dry.  Back lump.  Labs on Admission:   CBC: Recent Labs  Lab 09/10/23 1245 09/11/23 1605  WBC 12.1* 12.0*  NEUTROABS 9.3* 8.5*  HGB 8.2* 8.1*  HCT 27.0* 27.1*  MCV 81.1 81.9  PLT 484* 500*    Basic Metabolic Panel: Recent Labs  Lab 09/10/23 1245 09/11/23 1605  NA 134* 135  K 3.1* 3.3*  CL 102 103  CO2 22 23  GLUCOSE 151* 131*  BUN 12 16  CREATININE 1.04* 0.97  CALCIUM  9.0 8.7*    Liver Function Tests: Recent Labs  Lab 09/10/23 1245 09/11/23 1605  AST 11* 11*  ALT 8 6  ALKPHOS 93 93  BILITOT 0.4 0.5  PROT 9.6* 9.2*  ALBUMIN  2.2* 2.4*   No results for input(s): LIPASE, AMYLASE in the last 168 hours. No results  for input(s): AMMONIA in the last 168 hours.  Cardiac Enzymes: No results for input(s): CKTOTAL,  CKMB, CKMBINDEX, TROPONINI in the last 168 hours.  BNP (last 3 results) No results for input(s): BNP in the last 8760 hours.  ProBNP (last 3 results) No results for input(s): PROBNP in the last 8760 hours.  CBG: No results for input(s): GLUCAP in the last 168 hours.  Lipase  No results found for: LIPASE   Urinalysis    Component Value Date/Time   COLORURINE YELLOW 08/01/2022 0947   APPEARANCEUR HAZY (A) 08/01/2022 0947   LABSPEC 1.011 08/01/2022 0947   PHURINE 6.0 08/01/2022 0947   GLUCOSEU NEGATIVE 08/01/2022 0947   HGBUR NEGATIVE 08/01/2022 0947   BILIRUBINUR NEGATIVE 08/01/2022 0947   KETONESUR NEGATIVE 08/01/2022 0947   PROTEINUR 30 (A) 08/01/2022 0947   UROBILINOGEN 0.2 09/21/2011 1601   NITRITE NEGATIVE 08/01/2022 0947   LEUKOCYTESUR NEGATIVE 08/01/2022 0947     Drugs of Abuse  No results found for: LABOPIA, COCAINSCRNUR, LABBENZ, AMPHETMU, THCU, LABBARB    Radiological Exams on Admission: No results found.  EKG: None available   Consultant: Interventional radiology  Code Status: Full code  Microbiology blood cultures  Antibiotics: None currently  Family Communication:  Patients' condition and plan of care including tests being ordered have been discussed with the patient  who indicate understanding and agree with the plan.   Status is: Observation The patient remains OBS appropriate and will d/c before 2 midnights.   Severity of Illness: The appropriate patient status for this patient is OBSERVATION. Observation status is judged to be reasonable and necessary in order to provide the required intensity of service to ensure the patient's safety. The patient's presenting symptoms, physical exam findings, and initial radiographic and laboratory data in the context of their medical condition is felt to place them at decreased risk for further clinical deterioration. Furthermore, it is anticipated that the patient will be medically  stable for discharge from the hospital within 2 midnights of admission.   Signed, Vernal Alstrom, MD [facility] 09/11/2023

## 2023-09-11 NOTE — ED Triage Notes (Signed)
 Pt comes in sent over due to MRI results. Patient states abscess is shown from MRI results

## 2023-09-11 NOTE — ED Notes (Signed)
 Patient transported to CT

## 2023-09-11 NOTE — Hospital Course (Signed)
 Patient is a 73 years old female with past medical history of anemia, carotid artery disease, CAD, diabetes mellitus, hypertension, hyperlipidemia and sleep apnea, history of recent total hip arthroplasty on 07/16/2023 presented to the hospital with concerns for abscess in the MRI.  Patient stated that she was feeling weak with nausea and feels a bump on the backside of the body where she had surgery in the past.  Had seen Dr. Joshua as outpatient from orthopedics and had ordered MRI scan of the back on 09/06/2023 which showed operative changes from decompression and fusion with fluid collection in the right psoas muscle up to 19 cm with some prevertebral fluid collection at the L4-L5 which might represent postoperative hematoma versus seroma..  And then was referred to our hospital for further evaluation with interventional radiology with aspiration of the fluid findings .  Right psoas muscle fluid collection.  History of recent hip surgery. Back pain.   Possibility of hematoma versus seroma.  Unable to rule out abscess.  Has mild leukocytosis.  Patient has been admitted for IR guided aspiration of fluid.  We will hold off with antibiotics for now until we have culture report back.  Continue to monitor CBC.  Plan for CT scan of the abdomen and pelvis.  On Plavix  at home will hold for now due to anticipated procedure..  Continue gabapentin  and Robaxin .  Will need PT OT evaluation after IR intervention.  Diabetes mellitus type 2 On Foley at home.  Will continue sliding scale insulin  while in the hospital.  Continue to monitor.  Hemoglobin A1c within the last 3 months was 6.7.  Essential hypertension On hydrochlorothiazide  and metoprolol  at home.  Will hold off with HCTZ due to hypokalemia.  Continue metoprolol .  Add as needed hydralazine .  Hypokalemia.  Will hold off with HCTZ.  Will replace orally..  Check levels in AM.  Hyperlipidemia  Sleep apnea

## 2023-09-11 NOTE — Plan of Care (Signed)
   Problem: Activity: Goal: Risk for activity intolerance will decrease Outcome: Progressing   Problem: Nutrition: Goal: Adequate nutrition will be maintained Outcome: Progressing   Problem: Pain Managment: Goal: General experience of comfort will improve and/or be controlled Outcome: Progressing   Problem: Safety: Goal: Ability to remain free from injury will improve Outcome: Progressing

## 2023-09-11 NOTE — ED Provider Notes (Signed)
 [facility] Provider Note   CSN: [id number] Arrival date & time: 09/11/23  1208     Patient presents with: Back Pain   April Shaffer is a 73 y.o. female.   This is a 73 year old female presenting emergency department for evaluation for possible psoas abscess seen on outpatient MRI.  Remote history of spinal fusion, recent hip replacement in May on left hip.  Was doing well until roughly a week and a half ago had some nausea, some generalized weakness and developed a lump over her surgical scar on her back.  Went to her neurosurgeon who did outpatient MRI with fluid collection.  Denies fevers, chills, not having pain in hip.  No difficulty ambulating.   Back Pain      Prior to Admission medications   Medication Sig Start Date End Date Taking? Authorizing Provider  acetaminophen  (TYLENOL ) 500 MG tablet Take 500 mg by mouth every 6 (six) hours as needed for mild pain (pain score 1-3), moderate pain (pain score 4-6) or headache.   Yes [provider]  amLODipine  (NORVASC ) 10 MG tablet Take 10 mg by mouth in the morning. 06/18/23  Yes [provider]  aspirin  EC 81 MG tablet Take 1 tablet (81 mg total) by mouth 2 (two) times daily. To prevent blood clots for 30 days after surgery. 07/17/23  Yes Gawne, Meghan M, PA-C  carvedilol  (COREG  CR) 10 MG 24 hr capsule Take 10 mg by mouth in the morning. 05/11/23  Yes [provider]  clopidogrel  (PLAVIX ) 75 MG tablet Take 75 mg by mouth in the morning.   Yes [provider]  cyanocobalamin  (VITAMIN B12) 1000 MCG/ML injection Inject 1,000 mcg into the muscle every 30 (thirty) days.   Yes [provider]  ezetimibe  (ZETIA ) 10 MG tablet TAKE 1 TABLET EVERY DAY (NEED MD APPOINTMENT) Patient taking differently: Take 10 mg by mouth daily. 09/27/20  Yes Court Dorn PARAS, MD  gabapentin  (NEURONTIN ) 300 MG capsule Take 300 mg by mouth in the morning, at noon, and at bedtime. 06/09/21  Yes  [provider]  hydrochlorothiazide  (HYDRODIURIL ) 12.5 MG tablet Take 12.5 mg by mouth in the morning.   Yes [provider]  metFORMIN  (GLUCOPHAGE -XR) 750 MG 24 hr tablet Take 750 mg by mouth in the morning. 06/07/23  Yes [provider]  ondansetron  (ZOFRAN ) 4 MG tablet Take 4 mg by mouth every 8 (eight) hours as needed for nausea or vomiting. 05/20/23  Yes [provider]  ondansetron  (ZOFRAN -ODT) 4 MG disintegrating tablet Take 1 tablet (4 mg total) by mouth every 8 (eight) hours as needed for nausea or vomiting. Patient taking differently: Take 4 mg by mouth every 8 (eight) hours as needed for nausea or vomiting (DISSOLVE ORALLY). 07/17/23  Yes Gawne, Meghan M, PA-C  oxyCODONE -acetaminophen  (PERCOCET) 10-325 MG tablet Take 1 tablet by mouth every 4 (four) hours as needed for pain. Patient taking differently: Take 1 tablet by mouth See admin instructions. Take 1 tablet by mouth every 6-8 hours as needed for pain 12/03/20  Yes Joshua Alm Hamilton, MD  sennosides-docusate sodium  (SENOKOT-S) 8.6-50 MG tablet Take 2 tablets by mouth daily as needed for constipation (while taking narcotics). 07/17/23  Yes Gawne, Meghan M, PA-C  allopurinol  (ZYLOPRIM ) 300 MG tablet Take 1 tablet (300 mg total) by mouth daily. Patient not taking: Reported on 09/11/2023 07/09/19   Adella Norris, MD  glucose blood test strip Check sugars twice daily 01/31/16   Adella Norris, MD  methocarbamol  (ROBAXIN ) 750 MG tablet Take 1 tablet (750 mg total) by mouth every 8 (eight) hours as needed for muscle spasms. Patient not taking: Reported on 09/11/2023 07/17/23   Gawne, Meghan M, PA-C  oxyCODONE  (ROXICODONE ) 5 MG immediate release tablet Take 1 tablet (5 mg total) by mouth every 4 (four) hours as needed for severe pain (pain score 7-10) or breakthrough pain. after hip surgery that is not controlled with your normal daily dose of Percocet for chronic pain Patient not taking: Reported on  09/11/2023 07/17/23   Gawne, Meghan M, PA-C    Allergies: Zestoretic  [lisinopril -hydrochlorothiazide ], Codeine, Oxycodone , Ultram  [tramadol ], and Zocor  [simvastatin ]    Review of Systems  Musculoskeletal:  Positive for back pain.    Updated Vital Signs BP 119/63 (BP Location: Right Arm)   Pulse 85   Temp 97.7 F (36.5 C) (Oral)   Resp 18   Ht 5' 3 (1.6 m)   Wt 51.3 kg   SpO2 97%   BMI 20.02 kg/m   Physical Exam Vitals and nursing note reviewed.  Constitutional:      General: She is not in acute distress.    Appearance: She is not toxic-appearing.  HENT:     Nose: Nose normal.     Mouth/Throat:     Mouth: Mucous membranes are moist.  Eyes:     Conjunctiva/sclera: Conjunctivae normal.  Cardiovascular:     Rate and Rhythm: Normal rate and regular rhythm.  Pulmonary:     Effort: Pulmonary effort is normal.  Abdominal:     General: Abdomen is flat. There is no distension.     Tenderness: There is no abdominal tenderness. There is no guarding or rebound.  Musculoskeletal:        General: Normal range of motion.     Comments: Patient does have a palpable area of fluctuance overlying her surgical scar in her back.  No erythema warmth or tenderness.  Her hip surgical scar reassuring.  5 out of 5 plantarflexion dorsiflexion.  Full range of motion at the hip.  Skin:    General: Skin is warm and dry.     Capillary Refill: Capillary refill takes less than 2 seconds.  Neurological:     General: No focal deficit present.     Mental Status: She is alert and oriented to person, place, and time.  Psychiatric:        Mood and Affect: Mood normal.        Behavior: Behavior normal.     (all labs ordered are listed, but only abnormal results are displayed) Labs Reviewed  CBC WITH DIFFERENTIAL/PLATELET - Abnormal; Notable for the following components:      Result Value   WBC 12.0 (*)    RBC 3.31 (*)    Hemoglobin 8.1 (*)    HCT 27.1 (*)    MCH 24.5 (*)    MCHC 29.9 (*)    RDW  15.9 (*)    Platelets 500 (*)    Neutro Abs 8.5 (*)    Abs Immature Granulocytes 0.10 (*)    All other components within normal limits  COMPREHENSIVE METABOLIC PANEL WITH GFR - Abnormal; Notable for the following components:   Potassium 3.3 (*)    Glucose, Bld 131 (*)    Calcium  8.7 (*)    Total Protein 9.2 (*)    Albumin  2.4 (*)    AST 11 (*)    All other components within normal limits  CULTURE, BLOOD (ROUTINE X 2)  CULTURE,  BLOOD (ROUTINE X 2)  BASIC METABOLIC PANEL WITH GFR  CBC  MAGNESIUM   I-STAT CG4 LACTIC ACID, ED    EKG: None  Radiology: CT ABDOMEN PELVIS W CONTRAST Result Date: 09/11/2023 CLINICAL DATA:  Psoas abscess on MRI EXAM: CT ABDOMEN AND PELVIS WITH CONTRAST TECHNIQUE: Multidetector CT imaging of the abdomen and pelvis was performed using the standard protocol following bolus administration of intravenous contrast. RADIATION DOSE REDUCTION: This exam was performed according to the departmental dose-optimization program which includes automated exposure control, adjustment of the mA and/or kV according to patient size and/or use of iterative reconstruction technique. CONTRAST:  OMNIPAQUE  IOHEXOL  300 MG/ML  SOLN COMPARISON:  MRI lumbar spine 09/06/2023, prior lumbar MRI 11/30/2022, bone scan 04/30/2022, CT 04/23/2022, 07/18/2021 FINDINGS: Lower chest: Lung bases demonstrate no acute airspace disease. Coronary vascular calcification. Hepatobiliary: No focal liver abnormality is seen. No gallstones, gallbladder wall thickening, or biliary dilatation. Pancreas: Unremarkable. No pancreatic ductal dilatation or surrounding inflammatory changes. Spleen: Normal in size without focal abnormality. Adrenals/Urinary Tract: Adrenal glands are normal. Kidneys show no hydronephrosis. Bilateral renal cysts for which no imaging follow-up is recommended. Bladder is unremarkable global allowing for artifact. Stomach/Bowel: The stomach is nonenlarged. There is no dilated small bowel. No  acute bowel wall thickening. Vascular/Lymphatic: Advanced aortic atherosclerosis. No aneurysm. No suspicious lymph nodes. Reproductive: Hysterectomy.  No adnexal mass. Other: Negative for pelvic effusion or free air. Musculoskeletal: Extensive posterior spinal fusion hardware, extends from L1 through the sacrum with bilateral fixating screws across the SI joints. Residual listhesis at L3-L4 and L4-L5. Prominent lucency surrounding the transpedicular screws bilaterally at L1 and L2. Prominent lucency around bilateral S1 fixating screws as well as right greater than left SI joint fixating screws. As seen on recent lumbar spine MRI, large mildly rim enhancing fluid collection in the right psoas muscle, this measures approximately 5.2 x 4.1 by 8 cm. Additional rim enhancing fluid collection anterior to L4 on L5, partially obscured by artifact from hardware, this measures approximately 3.2 by 2 cm on axial series 12, image 45, by 4.3 cm craniocaudal on sagittal series 10, image 91. Prevertebral rim enhancing collection exhibits mass effect on the common iliac veins. There is mild stranding within the retroperitoneum at this level. Low-density appearance of left psoas muscle, difficult to exclude edema. Compared 2 previous exams, progressive widening of the bilateral SI joints with irregular margin. Progressive heterogeneous sclerosis in the sacrum and iliac bones abutting the SI joints. IMPRESSION: 1. Extensive posterior spinal fusion hardware extending from L1 through the sacrum with bilateral fixating screws across the SI joints. Again visualized is prominent lucency surrounding the transpedicular screws bilaterally at L1 and L2, bilateral S1 fixating screws as well as right greater than left SI joint fixating screws, findings consistent with hardware loosening and/or infection. 2. As seen on recent lumbar spine MRI, large mildly rim enhancing fluid collection in the right psoas muscle measuring up to 8 cm, suspicious  for an abscess in the appropriate clinical setting. Additional rim enhancing fluid collection anterior to L4 and L5, partially obscured by artifact from hardware, measuring up to 4.3 cm, also suspect for abscess. Low-density appearance of the left psoas muscle, potentially due to edema. 3. 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 4. Aortic atherosclerosis. Aortic Atherosclerosis (ICD10-I70.0). Electronically Signed   By: Luke  Scott M.D.   On: 09/11/2023 19:08     Procedures   Medications Ordered in the ED  metoprolol  succinate (TOPROL -XL) 24 hr tablet 25 mg (25 mg Oral Given 09/11/23 2012)  ondansetron  (ZOFRAN ) tablet 4 mg (has no administration in time range)  senna-docusate (Senokot-S) tablet 2 tablet (has no administration in time range)  gabapentin  (NEURONTIN ) capsule 600 mg (600 mg Oral Given 09/11/23 2012)  methocarbamol  (ROBAXIN ) tablet 750 mg (has no administration in time range)  0.9 %  sodium chloride  infusion (has no administration in time range)  sodium chloride  flush (NS) 0.9 % injection 3 mL (3 mLs Intravenous Given 09/11/23 2020)  sodium chloride  flush (NS) 0.9 % injection 3 mL (has no administration in time range)  0.9 %  sodium chloride  infusion (has no administration in time range)  acetaminophen  (TYLENOL ) tablet 650 mg (has no administration in time range)    Or  acetaminophen  (TYLENOL ) suppository 650 mg (has no administration in time range)  HYDROcodone -acetaminophen  (NORCO/VICODIN) 5-325 MG per tablet 1-2 tablet (has no administration in time range)  polyethylene glycol (MIRALAX  / GLYCOLAX ) packet 17 g (17 g Oral Given 09/11/23 2012)  hydrALAZINE  (APRESOLINE ) injection 5 mg (has no administration in time range)  potassium chloride  SA (KLOR-CON  M)  CR tablet 40 mEq (40 mEq Oral Given 09/11/23 2011)  iohexol  (OMNIPAQUE ) 300 MG/ML solution 100 mL (100 mLs Intravenous Contrast Given 09/11/23 1828)    Clinical Course as of 09/11/23 2033  Wed Sep 11, 2023  1537 From NSGY note this past week :  Jul-15-2025 back pain She comes in today for follow-up after MRI of the lumbar spine. Interestingly, she denies back pain or leg pain. She denies fevers or chills. All of this started a few weeks after a hip replacement. Jul-09-2023 back pain 73 year old patient comes in today concerned about a spot on the top of her incision that has been swelling. This started after she had her hip surgery. It started about a week ago. Denies any fevers. She does have a headache that started yesterday. Incision is not draining. She had a lumbar fusion in 2023  I suspect she has a psoas abscess, and I suspect she became bacteremic after her hip surgery. It is interesting that she has no pain. I think this is probably best worked up in an inpatient setting and I have sent her to the emergency department where I think she will need lab work, admission, interventional radiology to try to draw fluid out of the psoas, and then likely treatment. I can not imagine this is anything other than a psoas abscess.Follow-up here as needed [TY]    Clinical Course User Index [TY] Neysa Caron PARAS, DO                                 Medical Decision Making This is a 73 year old female presenting emergency department for evaluation of possible psoas abscess.  She is afebrile nontachycardic, maintaining oxygen saturation on room air.  Outpatient MRI with fluid collection seroma versus hematoma versus possible abscess.  Does have a leukocytosis on labs today.  Stable anemia.  Mildly low potassium.  Lactate is normal.  Blood cultures pending.  Discussed case with IR who recommending CT abdomen pelvis to better characterize fluid collection and can see tomorrow for aspiration.  As she is not  septic we will hold off on antibiotics at this time.  Case discussed with  hospitalist who agrees to admit patient.  Amount and/or Complexity of Data Reviewed External Data Reviewed:     Details: Complex past medical history diabetes hypertension hyperlipidemia, CAD Labs: ordered. Decision-making details documented in ED Course. Radiology: ordered.  Risk Prescription drug management. Decision regarding hospitalization.       Final diagnoses:  None    ED Discharge Orders     None          Neysa Caron PARAS, DO 09/11/23 2033

## 2023-09-12 ENCOUNTER — Encounter (HOSPITAL_COMMUNITY): Payer: Self-pay | Admitting: Internal Medicine

## 2023-09-12 ENCOUNTER — Observation Stay (HOSPITAL_COMMUNITY)

## 2023-09-12 DIAGNOSIS — D649 Anemia, unspecified: Secondary | ICD-10-CM | POA: Diagnosis not present

## 2023-09-12 DIAGNOSIS — K6812 Psoas muscle abscess: Secondary | ICD-10-CM | POA: Diagnosis not present

## 2023-09-12 DIAGNOSIS — B9561 Methicillin susceptible Staphylococcus aureus infection as the cause of diseases classified elsewhere: Secondary | ICD-10-CM | POA: Diagnosis not present

## 2023-09-12 DIAGNOSIS — E1142 Type 2 diabetes mellitus with diabetic polyneuropathy: Secondary | ICD-10-CM | POA: Diagnosis not present

## 2023-09-12 LAB — IRON AND TIBC
Iron: 18 ug/dL — ABNORMAL LOW (ref 28–170)
Saturation Ratios: 11 % (ref 10.4–31.8)
TIBC: 161 ug/dL — ABNORMAL LOW (ref 250–450)
UIBC: 143 ug/dL

## 2023-09-12 LAB — CBC
HCT: 22.5 % — ABNORMAL LOW (ref 36.0–46.0)
Hemoglobin: 6.8 g/dL — CL (ref 12.0–15.0)
MCH: 24.4 pg — ABNORMAL LOW (ref 26.0–34.0)
MCHC: 30.2 g/dL (ref 30.0–36.0)
MCV: 80.6 fL (ref 80.0–100.0)
Platelets: 414 K/uL — ABNORMAL HIGH (ref 150–400)
RBC: 2.79 MIL/uL — ABNORMAL LOW (ref 3.87–5.11)
RDW: 15.9 % — ABNORMAL HIGH (ref 11.5–15.5)
WBC: 12.3 K/uL — ABNORMAL HIGH (ref 4.0–10.5)
nRBC: 0 % (ref 0.0–0.2)

## 2023-09-12 LAB — FERRITIN: Ferritin: 214 ng/mL (ref 11–307)

## 2023-09-12 LAB — BASIC METABOLIC PANEL WITH GFR
Anion gap: 8 (ref 5–15)
BUN: 16 mg/dL (ref 8–23)
CO2: 23 mmol/L (ref 22–32)
Calcium: 8.2 mg/dL — ABNORMAL LOW (ref 8.9–10.3)
Chloride: 105 mmol/L (ref 98–111)
Creatinine, Ser: 0.98 mg/dL (ref 0.44–1.00)
GFR, Estimated: >60 mL/min (ref >60)
Glucose, Bld: 92 mg/dL (ref 70–99)
Potassium: 3.5 mmol/L (ref 3.5–5.1)
Sodium: 136 mmol/L (ref 135–145)

## 2023-09-12 LAB — RETICULOCYTES
Immature Retic Fract: 15.4 % (ref 2.3–15.9)
RBC.: 2.78 MIL/uL — ABNORMAL LOW (ref 3.87–5.11)
Retic Count, Absolute: 38.6 K/uL (ref 19.0–186.0)
Retic Ct Pct: 1.4 % (ref 0.4–3.1)

## 2023-09-12 LAB — VITAMIN B12: Vitamin B-12: 908 pg/mL (ref 180–914)

## 2023-09-12 LAB — PROTIME-INR
INR: 1.2 (ref 0.8–1.2)
Prothrombin Time: 15.4 s — ABNORMAL HIGH (ref 11.4–15.2)

## 2023-09-12 LAB — PREPARE RBC (CROSSMATCH)

## 2023-09-12 LAB — HEMOGLOBIN AND HEMATOCRIT, BLOOD
HCT: 28.6 % — ABNORMAL LOW (ref 36.0–46.0)
Hemoglobin: 8.8 g/dL — ABNORMAL LOW (ref 12.0–15.0)

## 2023-09-12 LAB — FOLATE: Folate: 4.1 ng/mL — ABNORMAL LOW (ref >5.9)

## 2023-09-12 LAB — MAGNESIUM: Magnesium: 2 mg/dL (ref 1.7–2.4)

## 2023-09-12 MED ORDER — SODIUM CHLORIDE 0.9% IV SOLUTION: Freq: Once | INTRAVENOUS | Status: DC

## 2023-09-12 MED ORDER — MIDAZOLAM HCL 2 MG/2ML IJ SOLN
INTRAMUSCULAR | Status: AC | PRN
  Administered 2023-09-12 (×3): 1 mg via INTRAVENOUS

## 2023-09-12 MED ORDER — SODIUM CHLORIDE 0.9 % IV SOLN
8.0000 mg/kg | Freq: Every day | INTRAVENOUS | Status: DC
  Filled 2023-09-12: qty 8

## 2023-09-12 MED ORDER — MIDAZOLAM HCL 2 MG/2ML IJ SOLN
INTRAMUSCULAR | Status: AC
  Filled 2023-09-12: qty 4

## 2023-09-12 MED ORDER — SODIUM CHLORIDE 0.9% FLUSH
5.0000 mL | Freq: Three times a day (TID) | INTRAVENOUS | Status: DC
  Administered 2023-09-12 – 2023-09-14 (×6): 5 mL

## 2023-09-12 MED ORDER — SODIUM CHLORIDE 0.9 % IV SOLN
2.0000 g | INTRAVENOUS | Status: DC
  Administered 2023-09-12: 2 g via INTRAVENOUS
  Filled 2023-09-12: qty 20

## 2023-09-12 MED ORDER — FENTANYL CITRATE (PF) 100 MCG/2ML IJ SOLN
INTRAMUSCULAR | Status: AC
  Filled 2023-09-12: qty 4

## 2023-09-12 MED ORDER — SODIUM CHLORIDE 0.9 % IV SOLN
8.0000 mg/kg | Freq: Every day | INTRAVENOUS | Status: DC
  Administered 2023-09-12 – 2023-09-14 (×3): 400 mg via INTRAVENOUS
  Filled 2023-09-12 (×3): qty 8

## 2023-09-12 MED ORDER — FENTANYL CITRATE (PF) 100 MCG/2ML IJ SOLN
INTRAMUSCULAR | Status: AC | PRN
  Administered 2023-09-12 (×2): 50 ug via INTRAVENOUS

## 2023-09-12 NOTE — Consult Note (Signed)
 Chief Complaint: Right psoas fluid collection ?abscess ;referred for image guided aspiration/possible drainage of psoas fluid collection  Referring Provider(s): Neysa DASEN  Supervising Physician: Philip Cornet  Patient Status: [facility] - In-pt  History of Present Illness: April Shaffer is a 73 y.o. female smoker with past medical history of anemia, arthritis, totally occluded right internal carotid artery/left carotid artery stenosis with stenting 2024, coronary artery disease on aspirin /Plavix , diabetes, hypertension, hyperlipidemia, peripheral arterial disease with right SFA stent, sleep apnea, total left hip arthroplasty in May of this year, prior lumbar spine surgery in 2022.  She now presents with weakness, recent nausea,  bump on her back which she felt  at site of previous spinal surgery.  Her neurosurgeon attempted aspiration of area as an outpatient but was unsuccessful.  MRI of the L-spine on 7/11 revealed:   1. Operative changes of posterior decompression and fusion from L1 through the sacrum. Interbody fusion at L1-L2, L2-L3, L3-L4, and L4-L5. Abutment metal hardware artifact obscures portions of the canal and posterior soft tissues. 2. Fluid collection in the right psoas muscle measuring up to 9-10 cm in craniocaudal dimension. There is also a prevertebral fluid collection at L4-L5. Findings may represent postoperative hematomas or seromas. Additional fluid collection in the superficial posterior soft tissue at the level of L1, may also represent a postoperative seroma or hematoma. Superimposed infections in the collections is not excluded on imaging. 3. At least moderate canal stenoses at L3-L4 and L4-L5, evaluation degraded by metal hardware artifact   CT of the abdomen pelvis yesterday revealed:    1. Extensive posterior spinal fusion hardware extending from L1 through the sacrum with bilateral fixating screws across the SI joints. Again visualized is prominent lucency  surrounding the transpedicular screws bilaterally at L1 and L2, bilateral S1 fixating screws as well as right greater than left SI joint fixating screws, findings consistent with hardware loosening and/or infection. 2. As seen on recent lumbar spine MRI, large mildly rim enhancing fluid collection in the right psoas muscle measuring up to 8 cm, suspicious for an abscess in the appropriate clinical setting. Additional rim enhancing fluid collection anterior to L4 and L5, partially obscured by artifact from hardware, measuring up to 4.3 cm, also suspect for abscess. Low-density appearance of the left psoas muscle, potentially due to edema. 3. 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 4. Aortic atherosclerosis  She is currently afebrile,BP soft, hgb 6.8- to receive transfusion, WBC 12.3, plts 414k, PT/INR pend, blood cx neg to date; request now received for image guided aspiration/possible drainage of right psoas fluid collection   Patient is Full Code  Past Medical History:  Diagnosis Date   Anemia    Arthritis    Blood transfusion without reported diagnosis    Carotid artery narrowing    Coronary artery disease    Cardiac catheterization November 2013: 50% ostial LAD stenosis 50% mid stenosis. 30% disease in the left circumflex.   Diabetes mellitus, type 2 (HCC)    Hyperlipidemia    Hypertension    Onychomycosis of toenail 07/31/2016   PAD (peripheral artery disease) (HCC)    Sleep apnea       Had surgery to correct   Thyroid  nodule     Past Surgical History:  Procedure Laterality Date   ABDOMINAL AORTOGRAM W/LOWER EXTREMITY N/A  08/07/2021   Procedure: ABDOMINAL AORTOGRAM W/LOWER EXTREMITY;  Surgeon: Sheree Penne Bruckner, MD;  Location: [facility]  INVASIVE CV LAB;  Service: Cardiovascular;  Laterality: N/A;   ABDOMINAL AORTOGRAM W/LOWER EXTREMITY Right 10/23/2021   Procedure: ABDOMINAL AORTOGRAM W/LOWER EXTREMITY;  Surgeon: Sheree Penne Bruckner, MD;  Location: [facility] INVASIVE CV LAB;  Service: Cardiovascular;  Laterality: Right;   ABDOMINAL HYSTERECTOMY     APPLICATION OF INTRAOPERATIVE CT SCAN N/A 11/30/2020   Procedure: APPLICATION OF INTRAOPERATIVE CT SCAN;  Surgeon: Joshua Alm RAMAN, MD;  Location: [facility] OR;  Service: Neurosurgery;  Laterality: N/A;   BACK SURGERY     CARDIAC CATHETERIZATION     CATARACT EXTRACTION, BILATERAL Bilateral 2021   Dr. Milissa   ENDARTERECTOMY  09/27/2011   Procedure: RIGT ENDARTERECTOMY CAROTID;  Surgeon: Lynwood JONETTA Collum, MD;  [facility];  Service: Vascular;  Laterality: Right;   EPIDURAL BLOCK INJECTION  02/2008   Drs. Clydell Mince   EYE SURGERY Bilateral 2021   cataract   gyn surgery  2004   total hysterectomy for mennorhagia,,salpingoophorectomy   INCISION AND DRAINAGE ABSCESS Left 05/08/2022   Procedure: ARTHROSCOPIC INCISION AND DRAINAGE ABSCESS, DISTAL CLAVICLE EXCISSION, SUBACROMIAL DECOMPRESSION, LOOSE BODY REMOVAL, EXTENSIVE DEBRIDEMENT;  Surgeon: Beverley Evalene JONETTA, MD;  Location: [facility] ORS;  Service: Orthopedics;  Laterality: Left;   LAMINECTOMY WITH POSTERIOR LATERAL ARTHRODESIS LEVEL 2 N/A 11/30/2020   Procedure: LUMBAR FOUR-FIVE, LUMBAR FIVE-SACRAL ONE POSTERIOR LATERAL FUSION WITH REVISION OF LUMBAR ONE-FIVE HARDWARE AND EXTENSION TO SACRAL ONE AND SACRAL TWO;  Surgeon: Joshua Alm RAMAN, MD;  Location: [facility] OR;  Service: Neurosurgery;  Laterality: N/A;   LUMBAR WOUND DEBRIDEMENT N/A 05/25/2020   Procedure: LUMBAR WOUND IRRIGATION AND DEBRIDEMENT;  Surgeon: Joshua Alm RAMAN, MD;  Location: [facility] OR;  Service: Neurosurgery;  Laterality: N/A;   PERIPHERAL VASCULAR INTERVENTION  08/07/2021   Procedure: PERIPHERAL VASCULAR INTERVENTION;  Surgeon: Sheree Penne Bruckner, MD;  Location: [facility] INVASIVE  CV LAB;  Service: Cardiovascular;;  Rt SFA   PERIPHERAL VASCULAR INTERVENTION Right 10/23/2021   Procedure: PERIPHERAL VASCULAR INTERVENTION;  Surgeon: Sheree Penne Bruckner, MD;  Location: [facility] INVASIVE CV LAB;  Service: Cardiovascular;  Laterality: Right;  SFA   POSTERIOR LUMBAR FUSION 4 LEVEL N/A 04/25/2020   Procedure: POSTERIOR LUMBAR INTERBODY FUSION LUMBAR ONE-TWO, LUMBAR TWO-THREE, LUMBAR THREE-FOUR,LUMBAR FOUR-FIVE.;  Surgeon: Joshua Alm RAMAN, MD;  Location: [facility] OR;  Service: Neurosurgery;  Laterality: N/A;  posterior   SHOULDER ARTHROSCOPY Left 05/08/2022   Procedure: ARTHROSCOPY SHOULDER;  Surgeon: Beverley Evalene JONETTA, MD;  Location: [facility]S;  Service: Orthopedics;  Laterality: Left;   TONSILLECTOMY     TOTAL ABDOMINAL HYSTERECTOMY W/ BILATERAL SALPINGOOPHORECTOMY Bilateral 2000   TOTAL HIP ARTHROPLASTY Right 08/22/2018   Procedure: TOTAL HIP ARTHROPLASTY;  Surgeon: Shari Sieving, MD;  Location: [facility];  Service: Orthopedics;  Laterality: Right;   TOTAL HIP ARTHROPLASTY Left 07/16/2023   Procedure: ARTHROPLASTY, HIP, TOTAL, ANTERIOR APPROACH;  Surgeon: Beverley Evalene JONETTA, MD;  Location: [facility] ORS;  Service: Orthopedics;  Laterality: Left;   TRANSCAROTID ARTERY REVASCULARIZATION  Left 08/07/2022   Procedure: Transcarotid Artery Revascularization;  Surgeon: Sheree Penne Bruckner, MD;  Location: [facility] OR;  Service: Vascular;  Laterality: Left;   ULTRASOUND GUIDANCE FOR VASCULAR ACCESS Left 08/07/2022   Procedure: ULTRASOUND GUIDANCE FOR VASCULAR ACCESS;  Surgeon: Sheree Penne Bruckner, MD;  Location: [facility] OR;  Service: Vascular;  Laterality: Left;   VARICOSE VEIN SURGERY  2008   stripping    Allergies: Zestoretic  [lisinopril -hydrochlorothiazide ], Codeine, Oxycodone , Ultram  [tramadol ], and Zocor  [simvastatin ]  Medications: Prior to Admission medications   Medication Sig Start Date End Date Taking? Authorizing Provider  acetaminophen  (TYLENOL ) 500 MG tablet Take 500 mg by mouth every 6  (six) hours as needed for mild pain (pain score 1-3), moderate pain (pain score 4-6) or headache.   Yes [provider]  amLODipine  (NORVASC ) 10 MG tablet Take 10 mg by mouth in the morning. 06/18/23  Yes [provider]  aspirin  EC 81 MG tablet Take 1 tablet (81 mg total) by mouth 2 (two) times daily. To prevent blood clots for 30 days after surgery. 07/17/23  Yes Gawne, Meghan M, PA-C  carvedilol  (COREG  CR) 10 MG 24 hr capsule Take 10 mg by mouth in the morning. 05/11/23  Yes [provider]  clopidogrel  (PLAVIX ) 75 MG tablet Take 75 mg by mouth in the morning.   Yes [provider]  cyanocobalamin  (VITAMIN B12) 1000 MCG/ML injection Inject 1,000 mcg into the muscle every 30 (thirty) days.   Yes [provider]  ezetimibe  (ZETIA ) 10 MG tablet TAKE 1 TABLET EVERY DAY (NEED MD APPOINTMENT) Patient taking differently: Take 10 mg by mouth daily. 09/27/20  Yes Court Dorn PARAS, MD  gabapentin  (NEURONTIN ) 300 MG capsule Take 300 mg by mouth in the morning, at noon, and at bedtime. 06/09/21  Yes [provider]  hydrochlorothiazide  (HYDRODIURIL ) 12.5 MG tablet Take 12.5 mg by mouth in the morning.   Yes [provider]  metFORMIN  (GLUCOPHAGE -XR) 750 MG 24 hr tablet Take 750 mg by mouth in the morning. 06/07/23  Yes [provider]  ondansetron  (ZOFRAN ) 4 MG tablet Take 4 mg by mouth every 8 (eight) hours as needed for nausea or vomiting. 05/20/23  Yes [provider]  ondansetron  (ZOFRAN -ODT) 4 MG disintegrating tablet Take 1 tablet (4 mg total) by mouth every 8 (eight) hours as needed for nausea or vomiting. Patient taking differently: Take 4 mg by mouth every 8 (eight) hours as needed for nausea or vomiting (DISSOLVE ORALLY). 07/17/23  Yes Gawne, Meghan M, PA-C  oxyCODONE -acetaminophen  (PERCOCET) 10-325 MG tablet Take 1 tablet by mouth every 4 (four) hours as needed for pain. Patient taking differently: Take 1 tablet by mouth See  admin instructions. Take 1 tablet by mouth every 6-8 hours as needed for pain 12/03/20  Yes Joshua Alm Hamilton, MD  sennosides-docusate sodium  (SENOKOT-S) 8.6-50 MG tablet Take 2 tablets by mouth daily as needed for constipation (while taking narcotics). 07/17/23  Yes Gawne, Meghan M, PA-C  allopurinol  (ZYLOPRIM ) 300 MG tablet Take 1 tablet (300 mg total) by mouth daily. Patient not taking: Reported on 09/11/2023 07/09/19   Adella Norris, MD  glucose blood test strip Check sugars twice daily 01/31/16   Adella Norris, MD  methocarbamol  (ROBAXIN ) 750 MG tablet Take 1 tablet (750 mg total) by mouth every 8 (eight) hours as needed for muscle spasms. Patient not taking: Reported on 09/11/2023 07/17/23   Gawne, Meghan M, PA-C  oxyCODONE  (ROXICODONE ) 5 MG immediate release tablet Take 1 tablet (5 mg total) by mouth every 4 (four) hours as needed for severe pain (pain score 7-10) or breakthrough pain. after hip surgery that is not controlled with your normal daily dose of Percocet for chronic pain Patient not taking: Reported on 09/11/2023 07/17/23   Ted Gerard HERO, PA-C     Family History  Problem Relation Age of Onset   Hypertension Sister    Hypothyroidism Sister    Cancer Maternal Grandmother        lung  Hypertension Son    Hypertension Sister    Diabetes Brother        lost toe in 2018 with new diagnosis of DM   Hypertension Son     Social History   Socioeconomic History   Marital status: Significant Other    Spouse name: Not on file   Number of children: 2   Years of education: 12   Highest education level: Not on file  Occupational History   Occupation: housekeeping-retired; 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    Comment: [employer]-retired 07/23/2014  Tobacco Use   Smoking status: Every Day    Current packs/day: 0.25    Average packs/day: 0.3 packs/day for 55.3 years (13.8 ttl pk-yrs)    Types: Cigarettes    Start date: 05/31/1968    Passive exposure: Never    Smokeless tobacco: Never   Tobacco comments:    Smoked x50 years, 1 pack would last 3 days. Currently smoking 4-5 cigarettes per day  Vaping Use   Vaping status: Never Used  Substance and Sexual Activity   Alcohol use: Not Currently    Alcohol/week: 14.0 standard drinks of alcohol    Types: 14 Standard drinks or equivalent per week    Comment: 1-2 drinks every other day   Drug use: No   Sexual activity: Not on file  Other Topics Concern   Not on file  Social History Narrative   Lives with long term boyfriend (30 years together)   Both sons live in [place]   Social Drivers of Health   Financial Resource Strain: Low Risk  (08/06/2018)   Overall Financial Resource Strain (CARDIA)    Difficulty of Paying Living Expenses: Not hard at all  Food Insecurity: No Food Insecurity (09/11/2023)   Hunger Vital Sign    Worried About Running Out of Food in the Last Year: Never true    Ran Out of Food in the Last Year: Never true  Transportation Needs: No Transportation Needs (09/11/2023)   PRAPARE - Administrator, Civil Service (Medical): No    Lack of Transportation (Non-Medical): No  Physical Activity: Inactive (06/17/2017)   Exercise Vital Sign    Days of Exercise per Week: 0 days    Minutes of Exercise per Session: 0 min  Stress: No Stress Concern Present (06/17/2017)   [employer] of Occupational Health - Occupational Stress Questionnaire    Feeling of Stress : Not at all  Social Connections: Unknown (09/11/2023)   Social Connection and Isolation Panel    Frequency of Communication with Friends and Family: Not on file    Frequency of Social Gatherings with Friends and Family: Not on file    Attends Religious Services: More than 4 times per year    Active Member of Clubs or Organizations: Not on file    Attends Engineer, structural: More than 4 times per year    Marital Status: Living with partner      Review of Systems see above, currently denies fever,  headache, chest pain, dyspnea, cough, abdominal pain, back pain, vomiting or bleeding  Vital Signs: BP (!) 96/51 (BP Location: Right Arm)   Pulse (!) 58   Temp 98.4 F (36.9 C) (Oral)   Resp 18   Ht 5' 3 (1.6 m)   Wt 113 lb (51.3 kg)   SpO2 99%   BMI 20.02 kg/m   Advance Care Plan: No documents on file   Physical Exam patient awake, alert.  Chest clear to  auscultation bilaterally.  Heart with regular rate and rhythm.  Abdomen soft, positive bowel sounds, nontender.  No lower extremity edema. Lump over the midline and towards the right side of the lower back on palpation.   Imaging: CT ABDOMEN PELVIS W CONTRAST Result Date: 09/11/2023 CLINICAL DATA:  Psoas abscess on MRI EXAM: CT ABDOMEN AND PELVIS WITH CONTRAST TECHNIQUE: Multidetector CT imaging of the abdomen and pelvis was performed using the standard protocol following bolus administration of intravenous contrast. RADIATION DOSE REDUCTION: This exam was performed according to the departmental dose-optimization program which includes automated exposure control, adjustment of the mA and/or kV according to patient size and/or use of iterative reconstruction technique. CONTRAST:  OMNIPAQUE  IOHEXOL  300 MG/ML  SOLN COMPARISON:  MRI lumbar spine 09/06/2023, prior lumbar MRI 11/30/2022, bone scan 04/30/2022, CT 04/23/2022, 07/18/2021 FINDINGS: Lower chest: Lung bases demonstrate no acute airspace disease. Coronary vascular calcification. Hepatobiliary: No focal liver abnormality is seen. No gallstones, gallbladder wall thickening, or biliary dilatation. Pancreas: Unremarkable. No pancreatic ductal dilatation or surrounding inflammatory changes. Spleen: Normal in size without focal abnormality. Adrenals/Urinary Tract: Adrenal glands are normal. Kidneys show no hydronephrosis. Bilateral renal cysts for which no imaging follow-up is recommended. Bladder is unremarkable global allowing for artifact. Stomach/Bowel: The stomach is nonenlarged.  There is no dilated small bowel. No acute bowel wall thickening. Vascular/Lymphatic: Advanced aortic atherosclerosis. No aneurysm. No suspicious lymph nodes. Reproductive: Hysterectomy.  No adnexal mass. Other: Negative for pelvic effusion or free air. Musculoskeletal: Extensive posterior spinal fusion hardware, extends from L1 through the sacrum with bilateral fixating screws across the SI joints. Residual listhesis at L3-L4 and L4-L5. Prominent lucency surrounding the transpedicular screws bilaterally at L1 and L2. Prominent lucency around bilateral S1 fixating screws as well as right greater than left SI joint fixating screws. As seen on recent lumbar spine MRI, large mildly rim enhancing fluid collection in the right psoas muscle, this measures approximately 5.2 x 4.1 by 8 cm. Additional rim enhancing fluid collection anterior to L4 on L5, partially obscured by artifact from hardware, this measures approximately 3.2 by 2 cm on axial series 12, image 45, by 4.3 cm craniocaudal on sagittal series 10, image 91. Prevertebral rim enhancing collection exhibits mass effect on the common iliac veins. There is mild stranding within the retroperitoneum at this level. Low-density appearance of left psoas muscle, difficult to exclude edema. Compared 2 previous exams, progressive widening of the bilateral SI joints with irregular margin. Progressive heterogeneous sclerosis in the sacrum and iliac bones abutting the SI joints. IMPRESSION: 1. Extensive posterior spinal fusion hardware extending from L1 through the sacrum with bilateral fixating screws across the SI joints. Again visualized is prominent lucency surrounding the transpedicular screws bilaterally at L1 and L2, bilateral S1 fixating screws as well as right greater than left SI joint fixating screws, findings consistent with hardware loosening and/or infection. 2. As seen on recent lumbar spine MRI, large mildly rim enhancing fluid collection in the right psoas  muscle measuring up to 8 cm, suspicious for an abscess in the appropriate clinical setting. Additional rim enhancing fluid collection anterior to L4 and L5, partially obscured by artifact from hardware, measuring up to 4.3 cm, also suspect for abscess. Low-density appearance of the left psoas muscle, potentially due to edema. 3. Compared to previous exams, progressive widening of the bilateral SI joints with irregular margin and progressive heterogeneous sclerosis in the sacrum and iliac bones abutting the SI joints, findings raise concern for possible septic  arthritis and chronic osteomyelitis. 4. Aortic atherosclerosis. Aortic Atherosclerosis (ICD10-I70.0). Electronically Signed   By: Luke Bun M.D.   On: 09/11/2023 19:08   MR Lumbar Spine W Wo Contrast Result Date: 09/06/2023 CLINICAL DATA:  Incision wound in back EXAM: MRI LUMBAR SPINE WITHOUT AND WITH CONTRAST TECHNIQUE: Multiplanar and multiecho pulse sequences of the lumbar spine were obtained without and with intravenous contrast. CONTRAST:  5  mL of vueway  IV COMPARISON:  MRI of the lumbar spine dated 11/30/2022 FINDINGS: Segmentation: Standard. Alignment:  Physiologic lumbar alignment is maintained. Vertebrae: Operative changes of posterior decompression and fusion from L1 through the sacrum. There is interbody fusion at L1-L2, L2-L3, L3-L4, and L4-L5. Abutment metal hardware artifact obscures portions of the canal and posterior soft tissues. No compression fractures. Conus medullaris and cauda equina: The conus medullaris terminates at the level of L1-L2. The distal spinal cord signal intensity is normal. Paraspinal and other soft tissues: Renal cysts bilaterally. Fluid collection in the right psoas muscle measuring up to 9-10 cm in craniocaudal dimension. There is also a prevertebral fluid collection at L4-L5. Findings may represent postoperative hematomas or seromas. There is also abundant edema in the psoas musculature bilaterally. Additional  fluid collection in the superficial posterior soft tissue at the level of L1 measuring up to 3.5 cm. The visualized aorta is normal. Disc levels: L1-L2: Interbody fusion. Facets are obscured. Mild bilateral neuroforaminal stenosis. No spinal canal stenosis. L2-L3: Interbody fusion. Facets are obscured. Moderate bilateral neuroforaminal stenosis. No spinal canal stenosis. L3-L4: Interbody fusion. Facets are obscured. Moderate bilateral neuroforaminal stenosis. Moderate spinal canal stenosis. L4-L5: Interbody fusion. Facets and foramina are obscured. At least moderate canal stenoses, evaluation degraded by metal hardware artifact. L5-S1: Mild disc bulge. Facets and foramina are obscured. At least mild canal stenosis. IMPRESSION: 1. Operative changes of posterior decompression and fusion from L1 through the sacrum. Interbody fusion at L1-L2, L2-L3, L3-L4, and L4-L5. Abutment metal hardware artifact obscures portions of the canal and posterior soft tissues. 2. Fluid collection in the right psoas muscle measuring up to 9-10 cm in craniocaudal dimension. There is also a prevertebral fluid collection at L4-L5. Findings may represent postoperative hematomas or seromas. Additional fluid collection in the superficial posterior soft tissue at the level of L1, may also represent a postoperative seroma or hematoma. Superimposed infections in the collections is not excluded on imaging. 3. 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 Electronically Signed   By: Clem Savory M.D.   On: 09/06/2023 14:39    Labs:  CBC: Recent Labs    06/19/23 1329 09/10/23 1245 09/11/23 1605 09/12/23 0536  WBC 10.3 12.1* 12.0* 12.3*  HGB 8.2* 8.2* 8.1* 6.8*  HCT 25.6* 27.0* 27.1* 22.5*  PLT 368 484* 500* 414*    COAGS: No results for input(s): INR, APTT in the last 8760 hours.  BMP: Recent Labs    07/08/23 1347 09/10/23 1245 09/11/23 1605 09/12/23 0352  NA 136  134* 135 136  K 3.0* 3.1* 3.3* 3.5  CL 100 102 103 105  CO2 23 22 23 23   GLUCOSE 106* 151* 131* 92  BUN 16 12 16 16   CALCIUM  8.5* 9.0 8.7* 8.2*  CREATININE 0.91 1.04* 0.97 0.98  GFRNONAA >60 57* >60 >60    LIVER FUNCTION TESTS: Recent Labs    09/10/23 1245 09/11/23 1605  BILITOT 0.4 0.5  AST 11* 11*  ALT 8 6  ALKPHOS 93 93  PROT 9.6* 9.2*  ALBUMIN   2.2* 2.4*    TUMOR MARKERS: No results for input(s): AFPTM, CEA, CA199, CHROMGRNA in the last 8760 hours.  Assessment and Plan: 73 y.o. female smoker with past medical history of anemia, arthritis, totally occluded right internal carotid artery/left carotid artery stenosis with stenting 2024, coronary artery disease on aspirin /Plavix , diabetes, hypertension, hyperlipidemia, peripheral arterial disease with right SFA stent, sleep apnea, total left hip arthroplasty in May of this year, prior lumbar spine surgery in 2022.  She now presents with weakness, recent nausea,  bump on her back which she felt  at site of previous spinal surgery.  Her neurosurgeon attempted aspiration of area as an outpatient but was unsuccessful.  MRI of the L-spine on 7/11 revealed:   1. Operative changes of posterior decompression and fusion from L1 through the sacrum. Interbody fusion at L1-L2, L2-L3, L3-L4, and L4-L5. Abutment metal hardware artifact obscures portions of the canal and posterior soft tissues. 2. Fluid collection in the right psoas muscle measuring up to 9-10 cm in craniocaudal dimension. There is also a prevertebral fluid collection at L4-L5. Findings may represent postoperative hematomas or seromas. Additional fluid collection in the superficial posterior soft tissue at the level of L1, may also represent a postoperative seroma or hematoma. Superimposed infections in the collections is not excluded on imaging. 3. At least moderate canal stenoses at L3-L4 and L4-L5, evaluation degraded by metal hardware artifact   CT of the  abdomen pelvis yesterday revealed:    1. Extensive posterior spinal fusion hardware extending from L1 through the sacrum with bilateral fixating screws across the SI joints. Again visualized is prominent lucency surrounding the transpedicular screws bilaterally at L1 and L2, bilateral S1 fixating screws as well as right greater than left SI joint fixating screws, findings consistent with hardware loosening and/or infection. 2. As seen on recent lumbar spine MRI, large mildly rim enhancing fluid collection in the right psoas muscle measuring up to 8 cm, suspicious for an abscess in the appropriate clinical setting. Additional rim enhancing fluid collection anterior to L4 and L5, partially obscured by artifact from hardware, measuring up to 4.3 cm, also suspect for abscess. Low-density appearance of the left psoas muscle, potentially due to edema. 3. 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 4. Aortic atherosclerosis  She is currently afebrile,BP soft, hgb 6.8- to receive transfusion, WBC 12.3, plts 414k, PT/INR pend, blood cx neg to date; request now received for image guided aspiration/possible drainage of right psoas fluid collection.  Imaging studies have been reviewed by Dr. Philip. Risks and benefits discussed with the patient including bleeding, infection, damage to adjacent structures/bowel and sepsis.  All of the patient's questions were answered, patient is agreeable to proceed. Consent signed and in chart.  Procedure planned for later today.   Thank you for allowing our service to participate in April Shaffer 's care.  Electronically Signed: D. Franky Rakers, PA-C   09/12/2023, 10:05 AM      I spent a total of 40 Minutes    in face to face in  clinical consultation, greater than 50% of which was counseling/coordinating care for image guided aspiration/possible drainage of right psoas fluid collection

## 2023-09-12 NOTE — Progress Notes (Signed)
  Progress Note   Patient: April Shaffer FMW:[id number] DOB: [birth year] DOA: 09/11/2023     0 DOS: the patient was seen and examined on 09/12/2023   Brief hospital course: 73 year old woman presented with right psoas muscle fluid collection.  Admitted for further evaluation.  Underwent aspiration of purulent fluid.   Assessment and Plan: Right psoas muscle abscess Status post aspiration and drain placement Empiric antibiotics started, infectious disease consulted Appears stable can likely discharge soon.  Place PICC tomorrow.  4-6 weeks IV followed by suppressive antibiotics  Normocytic anemia In 8s earlier this year, down to 6.8, probably acute/chronic disease No bleeding Hgb up appropriately s/p transfusion   Diabetes mellitus type 2 Patient is on metformin  at home.  Will continue sliding scale insulin , diabetic diet while in the hospital.  Continue to monitor.  Hemoglobin A1c within the last 3 months was 6.7. CBG stable   Essential hypertension On hydrochlorothiazide  and metoprolol  at home.  Will hold off with HCTZ due to hypokalemia.  Continue metoprolol .  Add as needed hydralazine .   Hypokalemia--resolved   Hyperlipidemia Not on statins.   Sleep apnea History of status he does not have it anymore.  Not on CPAP.      Subjective:  Feels ok Hopes to go home tomorrow  Physical Exam: Vitals:   09/12/23 1304 09/12/23 1341 09/12/23 1407 09/12/23 1445  BP: 120/64 (!) 117/59 131/61 (!) 121/51  Pulse: (!) 59 (!) 58 61 72  Resp: 16 18 18 16   Temp:  97.6 F (36.4 C) 97.8 F (36.6 C) (!) 97.3 F (36.3 C)  TempSrc:  Oral Oral Oral  SpO2: 100% 100% 100% 100%  Weight:      Height:       Physical Exam Vitals reviewed.  Constitutional:      General: She is not in acute distress.    Appearance: She is not ill-appearing or toxic-appearing.  Cardiovascular:     Rate and Rhythm: Normal rate and regular rhythm.     Heart sounds: No murmur heard. Pulmonary:     Effort:  Pulmonary effort is normal. No respiratory distress.     Breath sounds: No wheezing, rhonchi or rales.  Neurological:     Mental Status: She is alert.  Psychiatric:        Mood and Affect: Mood normal.        Behavior: Behavior normal.     Data Reviewed: Hgb up to 8.8  Family Communication: friend  Disposition: Status is: Observation      Time spent: 20 minutes  Author: Toribio Door, MD 09/12/2023 7:30 PM  For on call review [email or web address].

## 2023-09-12 NOTE — Progress Notes (Signed)
    Patient Name: April Shaffer           DOB: [birth year]  MRN: [id number]      Admission Date: 09/11/2023  Attending Provider: Jadine Toribio SQUIBB, MD  Primary Diagnosis: Psoas mass   Level of care: Med-Surg    CROSS COVER NOTE   Date of Service   09/12/2023   April Shaffer, 73 y.o. female, was admitted on 09/11/2023 for Psoas mass.    HPI/Events of Note   Anemia History of iron  deficiency anemia Hemoglobin 8.1 -->  6.8.  No acute changes reported.  BP borderline, SBP 90's. No melena, hematochezia, or other bleeding reported tonight.  Holding Plavix , possible hematoma versus seroma in the right psoas muscle.   Interventions/ Plan   Blood transfusion, 1 unit PRBC Recheck H&H after transfusion is completed, transfuse if HGB <7 Anemia Panel         Lavanda Horns, DNP, ACNPC- AG [facility] [place]

## 2023-09-12 NOTE — TOC Initial Note (Signed)
 Transition of Care [facility]) - Initial/Assessment Note    Patient Details  Name: April Shaffer MRN: [id number] Date of Birth: 01/25/1951  [facility] CM/SW Contact:    Alfonse JONELLE Rex, RN Phone Number: 09/12/2023, 3:26 PM  Clinical Narrative:  Met with patient at bedside to introduce role of TOC/NCM and review for dc planning, pt confirmed she has an established PCP, no current home care services, reports he has a RW and cane at home, patient reports resides alone, at baseline is independent with ADL's. Still drives, has family friends that live nearby with good support. MOON completed. TOC will continue to follow.                  Expected Discharge Plan: Home/Self Care Barriers to Discharge: Continued Medical Work up   Patient Goals and CMS Choice Patient states their goals for this hospitalization and ongoing recovery are:: return home          Expected Discharge Plan and Services       Living arrangements for the past 2 months: Single Family Home                                      Prior Living Arrangements/Services Living arrangements for the past 2 months: Single Family Home Lives with:: Self Patient language and need for interpreter reviewed:: Yes Do you feel safe going back to the place where you live?: Yes      Need for Family Participation in Patient Care: Yes (Comment) Care giver support system in place?: Yes (comment) Current home services: DME (RW, Cane) Criminal Activity/Legal Involvement Pertinent to Current Situation/Hospitalization: No - Comment as needed  Activities of Daily Living   ADL Screening (condition at time of admission) Independently performs ADLs?: Yes (appropriate for developmental age) Is the patient deaf or have difficulty hearing?: No Does the patient have difficulty seeing, even when wearing glasses/contacts?: No Does the patient have difficulty concentrating, remembering, or making decisions?: No  Permission  Sought/Granted                  Emotional Assessment   Attitude/Demeanor/Rapport: Engaged Affect (typically observed): Accepting Orientation: : Oriented to Self, Oriented to Place, Oriented to  Time, Oriented to Situation Alcohol / Substance Use: Not Applicable Psych Involvement: No (comment)  Admission diagnosis:  Back pain [M54.9] Patient Active Problem List   Diagnosis Date Noted   Psoas mass 09/11/2023   S/P total left hip arthroplasty 07/16/2023   Asymptomatic carotid artery stenosis without infarction, left 08/07/2022   Observation after surgery 08/07/2022   Septic arthritis of shoulder (HCC) 05/07/2022   Anemia of chronic disease 04/10/2022   Lumbar pseudoarthrosis 01/24/2022   Occlusion and stenosis of right carotid artery 01/24/2022   Iron  deficiency anemia secondary to blood loss (chronic) 01/08/2022   B12 deficiency 08/31/2021   Medication monitoring encounter 12/12/2020   Nausea 12/12/2020   Hammer toes of both feet 11/09/2020   Acute maxillary sinusitis 08/08/2020   Hyperlipidemia 08/08/2020   Pain due to onychomycosis of toenails of both feet 08/08/2020   PICC (peripherally inserted central catheter) in place 06/22/2020   Therapeutic drug monitoring 06/22/2020   Wound infection after surgery 05/25/2020   Wound drainage 05/24/2020   Local infection of the skin and subcutaneous tissue, unspecified 05/24/2020   Body mass index (BMI) 27.0-27.9, adult 05/10/2020   S/P lumbar fusion 04/25/2020  Microalbuminuria due to type 2 diabetes mellitus (HCC) 08/07/2019   Primary localized osteoarthritis of right hip 08/22/2018   Carpal tunnel syndrome, bilateral 08/06/2018   Type 2 diabetes mellitus with peripheral neuropathy (HCC) 08/06/2018   Degenerative joint disease of right hip 08/06/2018   Preoperative clearance 03/26/2018   Upper back pain on left side 10/30/2016   Encounter for postoperative carotid endarterectomy surveillance 12/07/2015   Varicosities of leg  06/03/2015   Acute left lumbar radiculopathy 04/15/2014   Chronic sciatica of right side 03/17/2013   Diabetic peripheral neuropathy (HCC) 03/17/2013   Coronary artery disease    Carotid artery stenosis s/p CEA 2013 10/16/2011   Carotid artery occlusion without infarction, right 09/17/2011   Carotid bruit 08/28/2011   Gout 04/13/2011   DM type 2 with diabetic peripheral neuropathy (HCC) 04/02/2008   Hyperlipidemia associated with type 2 diabetes mellitus (HCC) 04/02/2008   Essential hypertension 04/02/2008   DEGENERATIVE DISC DISEASE, LUMBOSACRAL SPINE 04/02/2008   PCP:  Claudene Prentice DELENA Mickey., FNP Pharmacy:   [facility] [address], [place] - [address] [place] [address] Phone: [phone] Fax: [phone]     Social Drivers of Health (SDOH) Social History: SDOH Screenings   Food Insecurity: No Food Insecurity (09/11/2023)  Housing: Low Risk  (09/11/2023)  Transportation Needs: No Transportation Needs (09/11/2023)  Utilities: Not At Risk (09/11/2023)  Depression (PHQ2-9): Low Risk  (07/05/2022)  Financial Resource Strain: Low Risk  (08/06/2018)  Physical Activity: Inactive (06/17/2017)  Social Connections: Unknown (09/11/2023)  Stress: No Stress Concern Present (06/17/2017)  Tobacco Use: High Risk (09/10/2023)   SDOH Interventions:     Readmission Risk Interventions    05/10/2022    3:52 PM  Readmission Risk Prevention Plan  Transportation Screening Complete  PCP or Specialist Appt within 3-5 Days Complete  HRI or Home Care Consult Complete  Social Work Consult for Recovery Care Planning/Counseling Complete  Palliative Care Screening Not Applicable  Medication Review [employer]) Complete

## 2023-09-12 NOTE — Plan of Care (Signed)

## 2023-09-12 NOTE — Care Management Obs Status (Signed)
 MEDICARE OBSERVATION STATUS NOTIFICATION   Patient Details  Name: April Shaffer MRN: [id number] Date of Birth: 13-Sep-1950   Medicare Observation Status Notification Given:       Alfonse JONELLE Rex, RN 09/12/2023, 3:15 PM

## 2023-09-12 NOTE — Procedures (Signed)
 Interventional Radiology Procedure:   Indications: Large right psoas fluid collection  Procedure: CT guided drain placement  Findings: 10 Fr drain placed in right psoas collection.   75 ml of yellow purulent fluid removed.  Complications: None     EBL: Minimal  Plan: Fluid sent for culture.  Follow drain output.   Kerman Pfost R. Philip, MD  Pager: [phone]

## 2023-09-12 NOTE — Consult Note (Signed)
 [facility]    Date of Admission:  09/11/2023     Reason for Consult: vertebral om/hardware complicating infection; psoas abscess    Referring Provider: Jadine Share     Lines:  Peripheral iv  Abx: Dapto/ceftriaxone  7/17-c        Assessment: 73 yo female with CAD, dm2, osa, prior extensive lumbar surgery with hardware in place, hx lumbar spine hardware associated mrsa infection in 2022 (s/p more than 6 months abx stopped 12/2020), left shoulder septic arthritis mrsa s/p 6 week abx, needing debridement recent left hip replacement 07/16/23 admitted with several weeks acute lower back pain with mri imaging showing right psoas fluid collection and prevertebral inflamatory change and L1 level also soft tissue fluid collection concerning for infection  04/2022 left shoulder operative cx mrsa (S daptomycin )  7/16 bcx ngtd 7/17 ir aspirate right psoas fluid collection cx in process  I reviewed prior note from ID clinic on her back/shoulder. She has been assymptomatic in terms of pain/fever/chill since finishing abx course for each of those. 2-3 weeks after the hip procedure though she developed acute progressive severe pain in the lower back. Mri suggestive and in setting of new pain concerning for infection; fluid collection is new from 12/2021 which was taken a year after she finished abx course for the back infection. Would be unusual to have relapse from prior back infection this long out or from the left shoulder  Query if she had introduced something since the hip surgery (which at this time no concern for infection clinically)  I suspect this would be mrsa again but recent hip surgery will await culture to see if any gnr  Nsg had reviewed case and no indication for surgical intervention. No red flag    I do not suspect SAPHO syndrome or other exotic vertebral/SI process OM such as tb/brucella    Plan: Continue daptomycin  and ceftriaxone   now that patient had aspirate done If mrsa again will need abx indefinitely for the back Maintain contact isolation Weekly ck/esr, biweekly cbc, cmp, crp while here Will place picc tomorrow if 7/16 bcx remains negative Plan 4-6 weeks opat iv abx and transition to oral suppressive abx based on culture Discussed with primary team     ------------------------------------------------ Principal Problem:   Psoas mass Active Problems:   DM type 2 with diabetic peripheral neuropathy (HCC)   Essential hypertension   S/P lumbar fusion   S/P total left hip arthroplasty    HPI: April Shaffer is a 73 y.o. female CAD, dm2, osa, prior extensive lumbar surgery with hardware in place, hx lumbar spine hardware associated mrsa infection in 2022 (s/p more than 6 months abx stopped 12/2020), left shoulder septic arthritis mrsa s/p 6 week abx, needing debridement recent left hip replacement 07/16/23 admitted with several weeks acute lower back pain with mri imaging showing right psoas fluid collection and prevertebral inflamatory change and L1 level also soft tissue fluid collection concerning for infection  Hx via patient and chart review Patient had hx lumbar spine infection seen by id in early 2022 and finished long course abx by end of that year. She hasn't had pain for the past 3 years till below described She also has mrsa left septic joint infection 04/2022 s/p appropriate iv abx course  She did well and underwent right hip arthroplasty 07/16/23  About 2-3 weeks after hip surgery developed acute lower back pain  No fever, chill, lower ext  weakness, incontinence of urine No numbness/tingling that is new  Afebrile here but wbc slightly up Mri and ct both concerning for new lumbar spine infection with fluid collection near l1 level along with right psoas abscess and prevertebral inflammatory change and also si joint chronic septic arthritis/om  Bcx ngtd No prior abx; underwent ir aspirate psoas  abscess 7/17 Nsg commented no current need for surgery   Family History  Problem Relation Age of Onset   Hypertension Sister    Hypothyroidism Sister    Cancer Maternal Grandmother        lung   Hypertension Son    Hypertension Sister    Diabetes Brother        lost toe in 2018 with new diagnosis of DM   Hypertension Son     Social History   Tobacco Use   Smoking status: Every Day    Current packs/day: 0.25    Average packs/day: 0.3 packs/day for 55.3 years (13.8 ttl pk-yrs)    Types: Cigarettes    Start date: 05/31/1968    Passive exposure: Never   Smokeless tobacco: Never   Tobacco comments:    Smoked x50 years, 1 pack would last 3 days. Currently smoking 4-5 cigarettes per day  Vaping Use   Vaping status: Never Used  Substance Use Topics   Alcohol use: Not Currently    Alcohol/week: 14.0 standard drinks of alcohol    Types: 14 Standard drinks or equivalent per week    Comment: 1-2 drinks every other day   Drug use: No    Allergies  Allergen Reactions   Zestoretic  [Lisinopril -Hydrochlorothiazide ] Swelling and Other (See Comments)    Angioedema + Tongue swelling    Codeine Itching   Oxycodone  Itching and Other (See Comments)    Patient is taking in 2025   Ultram  [Tramadol ] Itching   Zocor  [Simvastatin ] Other (See Comments)    Muscle pain    Review of Systems: ROS All Other ROS was negative, except mentioned above   Past Medical History:  Diagnosis Date   Anemia    Arthritis    Blood transfusion without reported diagnosis    Carotid artery narrowing    Coronary artery disease    Cardiac catheterization November 2013: 50% ostial LAD stenosis 50% mid stenosis. 30% disease in the left circumflex.   Diabetes mellitus, type 2 (HCC)    Hyperlipidemia    Hypertension    Onychomycosis of toenail 07/31/2016   PAD (peripheral artery disease) (HCC)    Sleep apnea       Had surgery to correct   Thyroid  nodule        Scheduled Meds:  sodium chloride     Intravenous Once   gabapentin   600 mg Oral TID   metoprolol  succinate  25 mg Oral Daily   polyethylene glycol  17 g Oral Daily   sodium chloride  flush  3 mL Intravenous Q12H   sodium chloride  flush  5 mL Intracatheter Q8H   Continuous Infusions:  sodium chloride  40 mL/hr at 09/12/23 1505   sodium chloride      cefTRIAXone  (ROCEPHIN )  IV 2 g (09/12/23 1507)   DAPTOmycin      PRN Meds:.sodium chloride , acetaminophen  **OR** acetaminophen , hydrALAZINE , HYDROcodone -acetaminophen , methocarbamol , ondansetron , senna-docusate, sodium chloride  flush   OBJECTIVE: Blood pressure (!) 121/51, pulse 72, temperature (!) 97.3 F (36.3 C), temperature source Oral, resp. rate 16, height 5' 3 (1.6 m), weight 51.3 kg, SpO2 100%.  Physical Exam General/constitutional: no distress, pleasant HEENT: Normocephalic, PER, Conj  Clear, EOMI, Oropharynx clear Neck supple CV: rrr no mrg Lungs: clear to auscultation, normal respiratory effort Abd: Soft, Nontender Ext: no edema Skin: No Rash Neuro: nonfocal MSK: no peripheral joint swelling/tenderness/warmth; back spines nontender. left hip incision all healed and no hip joint tenderness    Lab Results Lab Results  Component Value Date   WBC 12.3 (H) 09/12/2023   HGB 6.8 (LL) 09/12/2023   HCT 22.5 (L) 09/12/2023   MCV 80.6 09/12/2023   PLT 414 (H) 09/12/2023    Lab Results  Component Value Date   CREATININE 0.98 09/12/2023   BUN 16 09/12/2023   NA 136 09/12/2023   K 3.5 09/12/2023   CL 105 09/12/2023   CO2 23 09/12/2023    Lab Results  Component Value Date   ALT 6 09/11/2023   AST 11 (L) 09/11/2023   ALKPHOS 93 09/11/2023   BILITOT 0.5 09/11/2023      Microbiology: Recent Results (from the past 240 hours)  Blood culture (routine x 2)     Status: None (Preliminary result)   Collection Time: 09/10/23 12:45 PM   Specimen: BLOOD  Result Value Ref Range Status   Specimen Description BLOOD RIGHT ANTECUBITAL  Final   Special Requests    Final    BOTTLES DRAWN AEROBIC AND ANAEROBIC Blood Culture results may not be optimal due to an inadequate volume of blood received in culture bottles   Culture   Final    NO GROWTH 2 DAYS Performed at [facility], [address], [place], [place] [address]    Report Status PENDING  Incomplete  Culture, blood (routine x 2)     Status: None (Preliminary result)   Collection Time: 09/11/23  4:24 PM   Specimen: BLOOD  Result Value Ref Range Status   Specimen Description   Final    BLOOD LEFT ANTECUBITAL Performed at [facility], [address] [place], [place] [address]    Special Requests   Final    BOTTLES DRAWN AEROBIC AND ANAEROBIC Blood Culture adequate volume Performed at [facility], [address] [place], [place] [address]    Culture   Final    NO GROWTH < 12 HOURS Performed at [facility], [address] [place], [place] [address]    Report Status PENDING  Incomplete  Culture, blood (routine x 2)     Status: None (Preliminary result)   Collection Time: 09/11/23  4:46 PM   Specimen: BLOOD LEFT ARM  Result Value Ref Range Status   Specimen Description   Final    BLOOD LEFT ARM Performed at [facility], [address], [place], [place] [address]    Special Requests   Final    BOTTLES DRAWN AEROBIC AND ANAEROBIC Blood Culture adequate volume Performed at [facility], [address], [place], [place] [address]    Culture   Final    NO GROWTH < 12 HOURS Performed at [facility], [address], [place], [place] [address]    Report Status PENDING  Incomplete     Serology:    Imaging: If present, new imagings (plain films, ct scans, and mri) have been personally visualized and interpreted; radiology reports have been reviewed. Decision making incorporated into the Impression / Recommendations.  7/11 mri lumbar spine 1. Operative changes of posterior decompression and fusion  from L1 through the sacrum. Interbody fusion at L1-L2, L2-L3, L3-L4, and L4-L5. Abutment metal hardware  artifact obscures portions of the canal and posterior soft tissues. 2. Fluid collection in the right psoas muscle measuring up to 9-10 cm in craniocaudal dimension. There is also a prevertebral fluid collection at L4-L5. Findings may represent postoperative hematomas or seromas. Additional fluid collection in the superficial posterior soft tissue at the level of L1, may also represent a postoperative seroma or hematoma. Superimposed infections in the collections is not excluded on imaging. 3. 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  7/16 ct abd pelv with contrast 1. Extensive posterior spinal fusion hardware extending from L1 through the sacrum with bilateral fixating screws across the SI joints. Again visualized is prominent lucency surrounding the transpedicular screws bilaterally at L1 and L2, bilateral S1 fixating screws as well as right greater than left SI joint fixating screws, findings consistent with hardware loosening and/or infection. 2. As seen on recent lumbar spine MRI, large mildly rim enhancing fluid collection in the right psoas muscle measuring up to 8 cm, suspicious for an abscess in the appropriate clinical setting. Additional rim enhancing fluid collection anterior to L4 and L5, partially obscured by artifact from hardware, measuring up to 4.3 cm, also suspect for abscess. Low-density appearance of the left psoas muscle, potentially due to edema. 3. 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 4. Aortic atherosclerosis.  Constance ONEIDA Passer, MD [facility] [phone] pager    09/12/2023, 4:40 PM

## 2023-09-12 NOTE — Progress Notes (Signed)
 Patient ID: April Shaffer, female   DOB: 1951/01/05, 73 y.o.   MRN: [id number] I was called to look at her imaging   And offer an opinion.  I looked at the CT scan of the abdomen and pelvis.  She has a solid arthrodesis from L1-S1.  There is lucency around the upper screws and S1 and iliac screws, but these are old findings and happened early in the course of her recovery a few years ago.  She went on the solid arthrodesis so the lucency around the screws is no longer a clinical concern.  I am not an  SI joint specialist, but the widening appears to be chronic  and if there is chronic osteomyelitis there the treatment would be the same as the treatment for the psoas abscess, IV antibiotics for several weeks.  I do not believe she needs any further lumbar spine surgery.  Please call me if I can help in any way

## 2023-09-13 ENCOUNTER — Other Ambulatory Visit: Payer: Self-pay

## 2023-09-13 ENCOUNTER — Other Ambulatory Visit (HOSPITAL_COMMUNITY): Payer: Self-pay

## 2023-09-13 DIAGNOSIS — I10 Essential (primary) hypertension: Secondary | ICD-10-CM | POA: Diagnosis present

## 2023-09-13 DIAGNOSIS — F17210 Nicotine dependence, cigarettes, uncomplicated: Secondary | ICD-10-CM | POA: Diagnosis present

## 2023-09-13 DIAGNOSIS — K6812 Psoas muscle abscess: Secondary | ICD-10-CM | POA: Diagnosis present

## 2023-09-13 DIAGNOSIS — Z96643 Presence of artificial hip joint, bilateral: Secondary | ICD-10-CM | POA: Diagnosis present

## 2023-09-13 DIAGNOSIS — Z7902 Long term (current) use of antithrombotics/antiplatelets: Secondary | ICD-10-CM | POA: Diagnosis not present

## 2023-09-13 DIAGNOSIS — I2510 Atherosclerotic heart disease of native coronary artery without angina pectoris: Secondary | ICD-10-CM | POA: Diagnosis present

## 2023-09-13 DIAGNOSIS — M4620 Osteomyelitis of vertebra, site unspecified: Secondary | ICD-10-CM | POA: Insufficient documentation

## 2023-09-13 DIAGNOSIS — E1142 Type 2 diabetes mellitus with diabetic polyneuropathy: Secondary | ICD-10-CM

## 2023-09-13 DIAGNOSIS — B9561 Methicillin susceptible Staphylococcus aureus infection as the cause of diseases classified elsewhere: Secondary | ICD-10-CM | POA: Diagnosis not present

## 2023-09-13 DIAGNOSIS — Z794 Long term (current) use of insulin: Secondary | ICD-10-CM | POA: Diagnosis not present

## 2023-09-13 DIAGNOSIS — G4733 Obstructive sleep apnea (adult) (pediatric): Secondary | ICD-10-CM | POA: Diagnosis present

## 2023-09-13 DIAGNOSIS — Z8249 Family history of ischemic heart disease and other diseases of the circulatory system: Secondary | ICD-10-CM | POA: Diagnosis not present

## 2023-09-13 DIAGNOSIS — M6289 Other specified disorders of muscle: Secondary | ICD-10-CM | POA: Diagnosis not present

## 2023-09-13 DIAGNOSIS — Z8614 Personal history of Methicillin resistant Staphylococcus aureus infection: Secondary | ICD-10-CM | POA: Diagnosis not present

## 2023-09-13 DIAGNOSIS — Z79899 Other long term (current) drug therapy: Secondary | ICD-10-CM | POA: Diagnosis not present

## 2023-09-13 DIAGNOSIS — Z981 Arthrodesis status: Secondary | ICD-10-CM | POA: Diagnosis not present

## 2023-09-13 DIAGNOSIS — E876 Hypokalemia: Secondary | ICD-10-CM | POA: Diagnosis present

## 2023-09-13 DIAGNOSIS — Z833 Family history of diabetes mellitus: Secondary | ICD-10-CM | POA: Diagnosis not present

## 2023-09-13 DIAGNOSIS — L02212 Cutaneous abscess of back [any part, except buttock]: Secondary | ICD-10-CM | POA: Diagnosis present

## 2023-09-13 DIAGNOSIS — I700 Atherosclerosis of aorta: Secondary | ICD-10-CM | POA: Diagnosis present

## 2023-09-13 DIAGNOSIS — Z885 Allergy status to narcotic agent status: Secondary | ICD-10-CM | POA: Diagnosis not present

## 2023-09-13 DIAGNOSIS — T847XXA Infection and inflammatory reaction due to other internal orthopedic prosthetic devices, implants and grafts, initial encounter: Secondary | ICD-10-CM

## 2023-09-13 DIAGNOSIS — D649 Anemia, unspecified: Secondary | ICD-10-CM | POA: Diagnosis present

## 2023-09-13 DIAGNOSIS — Z90710 Acquired absence of both cervix and uterus: Secondary | ICD-10-CM | POA: Diagnosis not present

## 2023-09-13 DIAGNOSIS — Z7984 Long term (current) use of oral hypoglycemic drugs: Secondary | ICD-10-CM | POA: Diagnosis not present

## 2023-09-13 DIAGNOSIS — E1151 Type 2 diabetes mellitus with diabetic peripheral angiopathy without gangrene: Secondary | ICD-10-CM | POA: Diagnosis present

## 2023-09-13 DIAGNOSIS — E785 Hyperlipidemia, unspecified: Secondary | ICD-10-CM | POA: Diagnosis present

## 2023-09-13 DIAGNOSIS — G4730 Sleep apnea, unspecified: Secondary | ICD-10-CM | POA: Diagnosis present

## 2023-09-13 LAB — BPAM RBC
Blood Product Expiration Date: 202508122359
ISSUE DATE / TIME: 202507171025
Unit Type and Rh: 5100

## 2023-09-13 LAB — CBC
HCT: 28 % — ABNORMAL LOW (ref 36.0–46.0)
Hemoglobin: 8.7 g/dL — ABNORMAL LOW (ref 12.0–15.0)
MCH: 25.7 pg — ABNORMAL LOW (ref 26.0–34.0)
MCHC: 31.1 g/dL (ref 30.0–36.0)
MCV: 82.6 fL (ref 80.0–100.0)
Platelets: 420 K/uL — ABNORMAL HIGH (ref 150–400)
RBC: 3.39 MIL/uL — ABNORMAL LOW (ref 3.87–5.11)
RDW: 16.1 % — ABNORMAL HIGH (ref 11.5–15.5)
WBC: 14.2 K/uL — ABNORMAL HIGH (ref 4.0–10.5)
nRBC: 0 % (ref 0.0–0.2)

## 2023-09-13 LAB — TYPE AND SCREEN
ABO/RH(D): O POS
Antibody Screen: NEGATIVE
Unit division: 0

## 2023-09-13 LAB — CK: Total CK: 29 U/L — ABNORMAL LOW (ref 38–234)

## 2023-09-13 MED ORDER — SODIUM CHLORIDE 0.9% FLUSH: 10.0000 mL | INTRAVENOUS | Status: DC | PRN

## 2023-09-13 MED ORDER — DAPTOMYCIN IV (FOR PTA / DISCHARGE USE ONLY): 450.0000 mg | INTRAVENOUS | 0 refills | Status: AC

## 2023-09-13 MED ORDER — DIPHENOXYLATE-ATROPINE 2.5-0.025 MG PO TABS
1.0000 | ORAL_TABLET | Freq: Once | ORAL | Status: AC
  Administered 2023-09-14: 1 via ORAL
  Filled 2023-09-13: qty 1

## 2023-09-13 MED ORDER — SODIUM CHLORIDE FLUSH 0.9 % IV SOLN
10.0000 mL | INTRAVENOUS | 0 refills | Status: DC | PRN
  Filled 2023-09-13: qty 200, 20d supply, fill #0

## 2023-09-13 MED ORDER — ALUM & MAG HYDROXIDE-SIMETH 200-200-20 MG/5ML PO SUSP
30.0000 mL | Freq: Four times a day (QID) | ORAL | Status: DC | PRN
  Filled 2023-09-13: qty 30

## 2023-09-13 MED ORDER — CHLORHEXIDINE GLUCONATE CLOTH 2 % EX PADS
6.0000 | MEDICATED_PAD | Freq: Every day | CUTANEOUS | Status: DC
Start: 2023-09-13 — End: 2023-09-14
  Administered 2023-09-13 – 2023-09-14 (×2): 6 via TOPICAL

## 2023-09-13 NOTE — Progress Notes (Signed)
 Referring Provider(s): Dr. Caron Salt  Supervising Physician: Luverne Aran  Patient Status:  [facility] - In-pt  Chief Complaint: Right psoas fluid collection s/p drain placement on 7/17 seen for drain follow up  Brief History:  73 year old female with a history of smoking, arthritis, CAD on ASA/Plavix , T2DM, PAD w/ right SFA stent, total left hip arthroplasty, and previous lumbar spine surgery in 2022 who presented to the [facility] ED on 7/15 with weakness, nausea, and a bump on her back in the area of her prior spinal surgery. Neurosurgery attempted aspiration of the area without success. MRI was ordered on 7/11 which showed post-operative changes with a fluid collection in the right psoas muscle concerning for hematoma vs seroma. IR consulted and drain was placed on 7/17 with 75mL of blood tinged, purulent output removed. Cultures with staph aureus  Subjective: Pt doing ok today; denies worsening abd/back pain,N/V; afebrile  Patient  WBC 14.2 this morning, up from 12.3 yesterday. VSS.    Allergies: Zestoretic  [lisinopril -hydrochlorothiazide ], Codeine, Oxycodone , Ultram  [tramadol ], and Zocor  [simvastatin ]  Medications: Prior to Admission medications   Medication Sig Start Date End Date Taking? Authorizing Provider  acetaminophen  (TYLENOL ) 500 MG tablet Take 500 mg by mouth every 6 (six) hours as needed for mild pain (pain score 1-3), moderate pain (pain score 4-6) or headache.   Yes [provider]  amLODipine  (NORVASC ) 10 MG tablet Take 10 mg by mouth in the morning. 06/18/23  Yes [provider]  aspirin  EC 81 MG tablet Take 1 tablet (81 mg total) by mouth 2 (two) times daily. To prevent blood clots for 30 days after surgery. 07/17/23  Yes Gawne, Meghan M, PA-C  carvedilol  (COREG  CR) 10 MG 24 hr capsule Take 10 mg by mouth in the morning. 05/11/23  Yes [provider]  clopidogrel  (PLAVIX ) 75 MG tablet Take 75 mg by mouth in the morning.   Yes [provider]  cyanocobalamin  (VITAMIN B12) 1000 MCG/ML injection Inject 1,000 mcg into the muscle every 30 (thirty) days.   Yes [provider]  ezetimibe  (ZETIA ) 10 MG tablet TAKE 1 TABLET EVERY DAY (NEED MD APPOINTMENT) Patient taking differently: Take 10 mg by mouth daily. 09/27/20  Yes Court Dorn PARAS, MD  gabapentin  (NEURONTIN ) 300 MG capsule Take 300 mg by mouth in the morning, at noon, and at bedtime. 06/09/21  Yes [provider]  hydrochlorothiazide  (HYDRODIURIL ) 12.5 MG tablet Take 12.5 mg by mouth in the morning.   Yes [provider]  metFORMIN  (GLUCOPHAGE -XR) 750 MG 24 hr tablet Take 750 mg by mouth in the morning. 06/07/23  Yes [provider]  ondansetron  (ZOFRAN ) 4 MG tablet Take 4 mg by mouth every 8 (eight) hours as needed for nausea or vomiting. 05/20/23  Yes [provider]  ondansetron  (ZOFRAN -ODT) 4 MG disintegrating tablet Take 1 tablet (4 mg total) by mouth every 8 (eight) hours as needed for nausea or vomiting. Patient taking differently: Take 4 mg by mouth every 8 (eight) hours as needed for nausea or vomiting (DISSOLVE ORALLY). 07/17/23  Yes Gawne, Meghan M, PA-C  oxyCODONE -acetaminophen  (PERCOCET) 10-325 MG tablet Take 1 tablet by mouth every 4 (four) hours as needed for pain. Patient taking differently: Take 1 tablet by mouth See admin instructions. Take 1 tablet by mouth every 6-8 hours as needed for pain 12/03/20  Yes Joshua Alm Hamilton, MD  sennosides-docusate sodium  (SENOKOT-S) 8.6-50 MG tablet Take 2 tablets by mouth daily as needed for constipation (while taking narcotics). 07/17/23  Yes Gawne, Meghan M, PA-C  allopurinol  (ZYLOPRIM ) 300 MG tablet Take 1 tablet (300 mg total) by mouth daily. Patient not taking: Reported on 09/11/2023 07/09/19   Adella Norris, MD  glucose blood test strip Check sugars twice daily 01/31/16   Adella Norris, MD  methocarbamol  (ROBAXIN ) 750 MG tablet Take 1 tablet (750 mg total) by mouth every 8  (eight) hours as needed for muscle spasms. Patient not taking: Reported on 09/11/2023 07/17/23   Gawne, Meghan M, PA-C  oxyCODONE  (ROXICODONE ) 5 MG immediate release tablet Take 1 tablet (5 mg total) by mouth every 4 (four) hours as needed for severe pain (pain score 7-10) or breakthrough pain. after hip surgery that is not controlled with your normal daily dose of Percocet for chronic pain Patient not taking: Reported on 09/11/2023 07/17/23   Ted Gerard HERO, PA-C     Vital Signs: BP 112/62 (BP Location: Left Arm)   Pulse 66   Temp (!) 97.3 F (36.3 C) (Oral)   Resp 16   Ht 5' 3 (1.6 m)   Wt 109 lb 6.4 oz (49.6 kg)   SpO2 100%   BMI 19.38 kg/m   Physical Exam: awake/alert; rt lower abd drain intact, dressing clean and dry, OP 188 cc blood-tinged fluid  Drain Location: RLQ Size: Fr size: 10 Fr Date of placement: 09/12/23  Currently to: Drain collection device: suction bulb 24 hour output:  Output by Drain (mL) 09/11/23 0701 - 09/11/23 1900 09/11/23 1901 - 09/12/23 0700 09/12/23 0701 - 09/12/23 1900 09/12/23 1901 - 09/13/23 0700 09/13/23 0701 - 09/13/23 1033  Closed System Drain 1 Proximal;Right Thigh Bulb (JP) 10 Fr.   140 48     Interval imaging/drain manipulation:  None  Current examination: Flushes/aspirates easily.  Insertion site unremarkable. Suture and stat lock in place. Dressed appropriately.    Labs:  CBC: Recent Labs    09/10/23 1245 09/11/23 1605 09/12/23 0536 09/12/23 1720 09/13/23 0343  WBC 12.1* 12.0* 12.3*  --  14.2*  HGB 8.2* 8.1* 6.8* 8.8* 8.7*  HCT 27.0* 27.1* 22.5* 28.6* 28.0*  PLT 484* 500* 414*  --  420*    COAGS: Recent Labs    09/12/23 0944  INR 1.2    BMP: Recent Labs    07/08/23 1347 09/10/23 1245 09/11/23 1605 09/12/23 0352  NA 136 134* 135 136  K 3.0* 3.1* 3.3* 3.5  CL 100 102 103 105  CO2 23 22 23 23   GLUCOSE 106* 151* 131* 92  BUN 16 12 16 16   CALCIUM  8.5* 9.0 8.7* 8.2*  CREATININE 0.91 1.04* 0.97 0.98  GFRNONAA  >60 57* >60 >60    LIVER FUNCTION TESTS: Recent Labs    09/10/23 1245 09/11/23 1605  BILITOT 0.4 0.5  AST 11* 11*  ALT 8 6  ALKPHOS 93 93  PROT 9.6* 9.2*  ALBUMIN  2.2* 2.4*    Assessment and Plan:  Right Psoas Abscess: MARVALENE BARRETT is a 73 y.o. female with a history of spinal surgery in 2022 found to have a right psoas abscess. Patient underwent drain placement in IR on 7/17 with Dr. DELENA Balder.   -WBC up to 14.2 today , hgb 8.7(8.8) -Cultures with staph aureus -Continue antibiotics per primary team -IR will continue to follow  While Inpatient: Continue TID flushes with 5 cc NS. Record output Q shift. Dressing changes QD or PRN if soiled.  Call IR APP or on call IR MD if difficulty flushing or sudden change in drain output.  Repeat imaging/possible drain injection once output < 10 mL/QD (excluding flush material). Consideration for drain removal if output is < 10 mL/QD (excluding flush material), pending discussion with the providing surgical service.  Discharge planning: Please contact IR APP or on call IR MD prior to patient d/c to ensure appropriate follow up plans are in place. Typically patient will follow up with IR clinic 10-14 days post d/c for repeat imaging/possible drain injection. IR scheduler will contact patient with date/time of appointment. Patient will need to flush drain QD with 5 cc NS, record output QD, dressing changes every 2-3 days or earlier if soiled.      Thank you for allowing our service to participate in JULITA OZBUN 's care.   Electronically Signed: Caitlyn M McInnis, PA-C/Kevin Myndi Wamble,PA-C 09/13/2023, 10:23 AM    I spent a total of 15 Minutes at the the patient's bedside AND on the patient's hospital floor or unit, greater than 50% of which was counseling/coordinating care for drain care follow up.

## 2023-09-13 NOTE — Progress Notes (Signed)
 Peripherally Inserted Central Catheter Placement  The IV Nurse has discussed with the patient and/or persons authorized to consent for the patient, the purpose of this procedure and the potential benefits and risks involved with this procedure.  The benefits include less needle sticks, lab draws from the catheter, and the patient may be discharged home with the catheter. Risks include, but not limited to, infection, bleeding, blood clot (thrombus formation), and puncture of an artery; nerve damage and irregular heartbeat and possibility to perform a PICC exchange if needed/ordered by physician.  Alternatives to this procedure were also discussed.  Bard Power PICC patient education guide, fact sheet on infection prevention and patient information card has been provided to patient /or left at bedside.    PICC Placement Documentation  PICC Single Lumen 09/13/23 Right Basilic 36 cm 0 cm (Active)  Indication for Insertion or Continuance of Line Home intravenous therapies (PICC only) 09/13/23 1852  Exposed Catheter (cm) 0 cm 09/13/23 1852  Site Assessment Clean, Dry, Intact 09/13/23 1852  Line Status Flushed;Saline locked;Blood return noted 09/13/23 1852  Dressing Type Transparent;Securing device 09/13/23 1852  Dressing Status Antimicrobial disc/dressing in place;Clean, Dry, Intact 09/13/23 1852  Line Care Connections checked and tightened 09/13/23 1852  Line Adjustment (NICU/IV Team Only) No 09/13/23 1852  Dressing Intervention New dressing;Adhesive placed at insertion site (IV team only) 09/13/23 1852  Dressing Change Due 09/20/23 09/13/23 1852       Jolee Na 09/13/2023, 6:53 PM

## 2023-09-13 NOTE — Progress Notes (Addendum)
 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 She has been assymptomatic in terms of pain/fever/chill since finishing abx course for each of those. 2-3 weeks after the hip procedure though she developed acute progressive severe pain in the lower back. Mri suggestive and in setting of new pain concerning for infection; fluid collection is new from 12/2021 which was taken a year after she finished abx course for the back infection. Would be unusual to have relapse from prior back infection this long out or from the left shoulder   Query if she had introduced something since the hip surgery (which at this time no concern for infection clinically)   I suspect this would be mrsa again but recent hip surgery will await culture to see if any gnr   Nsg had reviewed case and no indication for surgical intervention. No red flag       I do not suspect SAPHO syndrome or other exotic vertebral/SI process OM such as tb/brucella   ---------- 09/13/23 id assessment Cx from aspirate growing staph aureus. Suspect mrsa again The perc-drain is  draining serosanguinous fluid She has a swelling on left side upper lumbar area (she said this was attempted to be aspirated in nsg clinic but no fluid found)  Will need life long abx suppression. Plan 6 weeks iv induction abx  Will need to get susceptibility repeat on dapto too. For now ok to continue given stability  7/16 and 7/15 bcx remain negative 7/17 aspirate cx gpc clusters (presumed mrsa)  -continue daptomycin  -stop ceftriaxone  -will ask micro to send out dapto susceptibility once species identified -maintain contact precaution isolation -opat setup -she can discharge when cleared by primary team, identification of gpc won't affect daptomycin  use at this time and the susceptibility we'll request once mrsa is identified -discussed with primary team   OPAT Orders Discharge antibiotics to be given via PICC line Discharge antibiotics: Daptomycin    Duration: 6 weeks End Date: 10/24/23  [facility] Care Per Protocol:  Home health RN for IV administration and teaching; PICC line care and labs.    Labs weekly while on IV antibiotics: _x_ CBC with differential __ BMP _x_ CMP _x_ CRP _x_ ESR __ Vancomycin  trough _x_ CK  _x_ Please pull PIC at completion of IV antibiotics __ Please leave PIC in place until doctor has seen patient or  been notified  Fax weekly labs to [phone]  Clinic Follow Up Appt: 8/5 @ 2pm  @  [facility] [address], [place], [place] [address] Phone: [phone]    Principal Problem:   Psoas mass Active Problems:   DM type 2 with diabetic peripheral neuropathy (HCC)   Essential hypertension   S/P lumbar fusion   S/P total left hip arthroplasty   Allergies  Allergen Reactions   Zestoretic  [Lisinopril -Hydrochlorothiazide ] Swelling and Other (See Comments)    Angioedema + Tongue swelling    Codeine Itching   Oxycodone  Itching and Other (See Comments)    Patient is taking in 2025   Ultram  [Tramadol ] Itching   Zocor   [Simvastatin ] Other (See Comments)    Muscle pain    Scheduled Meds:  gabapentin   600 mg Oral TID   metoprolol  succinate  25 mg Oral Daily   polyethylene glycol  17 g Oral Daily   sodium chloride  flush  3 mL Intravenous Q12H   sodium chloride  flush  5 mL Intracatheter Q8H   Continuous Infusions:  DAPTOmycin  Stopped (09/12/23 1726)   PRN Meds:.acetaminophen  **OR** acetaminophen , hydrALAZINE , HYDROcodone -acetaminophen , methocarbamol , ondansetron , senna-docusate, sodium chloride  flush   SUBJECTIVE: Back pain stable No other complaint Gpc on culture not yet identified  Picc placed Patient inquired about lifelong abx    Review of Systems: ROS All other ROS was negative, except mentioned above     OBJECTIVE: Vitals:   09/12/23 1445 09/12/23 2123 09/13/23 0500 09/13/23 0609  BP: (!) 121/51 110/65  112/62  Pulse: 72 67  66  Resp: 16 15  16   Temp: (!) 97.3 F (36.3 C) 98.4 F (36.9 C)  (!) 97.3 F (36.3 C)  TempSrc: Oral Oral  Oral  SpO2: 100% 100%  100%  Weight:   49.6 kg   Height:       Body mass index is 19.38 kg/m.  Physical Exam General/constitutional: no distress, pleasant HEENT: Normocephalic, PER, Conj Clear, EOMI, Oropharynx clear Neck supple CV: rrr no mrg Lungs: clear to auscultation, normal respiratory effort Abd: Soft, Nontender; right lower quadrant jp drain site no bleeding/nontender Ext: no edema Skin: No Rash Neuro: nonfocal MSK: left upper lumbar area has a 2 inch soft fluctuance but minimal tenderness   Lab Results Lab Results  Component Value Date   WBC 14.2 (H) 09/13/2023   HGB 8.7 (L) 09/13/2023   HCT 28.0 (L) 09/13/2023   MCV 82.6 09/13/2023   PLT 420 (H) 09/13/2023    Lab Results  Component Value Date   CREATININE 0.98 09/12/2023   BUN 16 09/12/2023   NA 136 09/12/2023   K 3.5 09/12/2023   CL 105 09/12/2023   CO2 23 09/12/2023    Lab Results  Component Value Date   ALT 6 09/11/2023   AST 11 (L) 09/11/2023   ALKPHOS  93 09/11/2023   BILITOT 0.5 09/11/2023      Microbiology: Recent Results (from the past 240 hours)  Blood culture (routine x 2)     Status: None (Preliminary result)   Collection Time: 09/10/23 12:45 PM   Specimen: BLOOD  Result Value Ref Range Status   Specimen Description BLOOD RIGHT ANTECUBITAL  Final   Special Requests   Final    BOTTLES DRAWN AEROBIC AND ANAEROBIC Blood Culture results may not be optimal due to an inadequate volume of blood received in culture bottles   Culture   Final    NO GROWTH 3 DAYS Performed at [facility], [address] [place], [place] [address]    Report Status PENDING  Incomplete  Culture, blood (routine x 2)     Status: None (Preliminary result)   Collection Time: 09/11/23  4:24 PM   Specimen: BLOOD  Result Value Ref Range Status   Specimen Description   Final    BLOOD LEFT ANTECUBITAL Performed at [facility], [address] [place], [place] [address]    Special Requests   Final    BOTTLES DRAWN AEROBIC AND ANAEROBIC Blood Culture adequate volume Performed at [facility], [address] [place], [place] [address]    Culture   Final    NO GROWTH 2 DAYS Performed at [facility], [address], [place], [place] [address]    Report Status PENDING  Incomplete  Culture, blood (routine x 2)     Status: None (Preliminary result)   Collection Time: 09/11/23  4:46 PM   Specimen: BLOOD LEFT ARM  Result Value Ref Range Status   Specimen Description   Final    BLOOD LEFT ARM Performed at [facility], [address], [place], [place] [address]    Special Requests   Final    BOTTLES DRAWN AEROBIC AND ANAEROBIC Blood Culture adequate volume Performed at [facility], [address] [place], [place] [address]    Culture   Final    NO GROWTH 2 DAYS Performed at [facility], [address], [place], [place] [address]    Report Status PENDING  Incomplete   Aerobic/Anaerobic Culture w Gram Stain (surgical/deep wound)     Status: None (Preliminary result)   Collection Time: 09/12/23 12:25 PM   Specimen: Abscess  Result Value Ref Range Status   Specimen Description   Final    ABSCESS Performed at [facility], [address] [place], [place] [address]    Special Requests   Final    NONE Performed at [facility], [address] [place], [place] [address]    Gram Stain   Final    ABUNDANT WBC PRESENT,BOTH PMN AND MONONUCLEAR ABUNDANT GRAM POSITIVE COCCI IN CLUSTERS    Culture   Final    CULTURE REINCUBATED FOR BETTER GROWTH Performed at [facility], [address] [place], [place] [address]    Report Status PENDING  Incomplete     Serology:   Imaging: If present, new imagings (plain films, ct scans, and mri) have been personally visualized and interpreted; radiology reports have been reviewed. Decision making incorporated into the Impression / Recommendations.  7/11 mri lumbar spine 1. Operative changes of posterior decompression and fusion from L1 through the sacrum. Interbody fusion at L1-L2, L2-L3, L3-L4, and L4-L5. Abutment metal hardware artifact obscures portions of the canal and posterior soft tissues. 2. Fluid collection in the right psoas muscle measuring up to 9-10 cm in craniocaudal dimension. There is also a prevertebral fluid collection at L4-L5. Findings may represent postoperative hematomas or seromas. Additional fluid collection in the superficial posterior soft tissue at the level of L1, may also represent a postoperative seroma or hematoma. Superimposed infections in the collections is not excluded on imaging. 3. 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   7/16 ct abd pelv with contrast 1. Extensive posterior spinal fusion hardware extending from L1 through the sacrum with bilateral fixating screws across the  SI joints. Again visualized  is prominent lucency surrounding the transpedicular screws bilaterally at L1 and L2, bilateral S1 fixating screws as well as right greater than left SI joint fixating screws, findings consistent with hardware loosening and/or infection. 2. As seen on recent lumbar spine MRI, large mildly rim enhancing fluid collection in the right psoas muscle measuring up to 8 cm, suspicious for an abscess in the appropriate clinical setting. Additional rim enhancing fluid collection anterior to L4 and L5, partially obscured by artifact from hardware, measuring up to 4.3 cm, also suspect for abscess. Low-density appearance of the left psoas muscle, potentially due to edema. 3. 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 4. Aortic atherosclerosis.  Constance ONEIDA Passer, MD [facility] [phone] pager    09/13/2023, 1:01 PM

## 2023-09-13 NOTE — Progress Notes (Signed)
  Progress Note   Patient: April Shaffer FMW:[id number] DOB: Sep 29, 1950 DOA: 09/11/2023     0 DOS: the patient was seen and examined on 09/13/2023   Brief hospital course: 73 year old woman presented with right psoas muscle fluid collection.  Admitted for further evaluation.  Underwent aspiration of purulent fluid.   Assessment and Plan: Right psoas muscle abscess Status post aspiration and drain placement Empiric antibiotics started, infectious disease consulted Place PICC line, discharge home with home health likely tomorrow once PICC line in place.  IV antibiotics per ID with plan for lifelong suppressive oral antibiotics to follow.   Normocytic anemia In 8s earlier this year, down to 6.8, probably acute/chronic disease No bleeding Hgb up appropriately s/p transfusion.  Hemoglobin remained stable.   Diabetes mellitus type 2 Patient is on metformin  at home. Continue sliding scale insulin , diabetic diet while in the hospital.  Continue to monitor.  Hemoglobin A1c within the last 3 months was 6.7. CBG stable   Essential hypertension On hydrochlorothiazide  and metoprolol  at home.  Will hold off with HCTZ due to hypokalemia.  Continue metoprolol .  Add as needed hydralazine .   Hypokalemia--resolved   Hyperlipidemia Not on statins.   Sleep apnea History of status he does not have it anymore.  Not on CPAP.     Subjective:  Feels fine  Physical Exam: Vitals:   09/12/23 2123 09/13/23 0500 09/13/23 0609 09/13/23 1423  BP: 110/65  112/62 127/60  Pulse: 67  66 65  Resp: 15  16 18   Temp: 98.4 F (36.9 C)  (!) 97.3 F (36.3 C) 98.2 F (36.8 C)  TempSrc: Oral  Oral Oral  SpO2: 100%  100% 100%  Weight:  49.6 kg    Height:       Physical Exam Vitals reviewed.  Constitutional:      General: She is not in acute distress.    Appearance: She is not ill-appearing or toxic-appearing.  Cardiovascular:     Rate and Rhythm: Normal rate and regular rhythm.     Heart sounds: No  murmur heard. Pulmonary:     Effort: Pulmonary effort is normal. No respiratory distress.     Breath sounds: No wheezing, rhonchi or rales.  Neurological:     Mental Status: She is alert.  Psychiatric:        Mood and Affect: Mood normal.        Behavior: Behavior normal.     Data Reviewed: Hgb stable 8.7  Family Communication: none  Disposition: Home with HH      Time spent: 20 minutes  Author: Toribio Door, MD 09/13/2023 6:15 PM  For on call review [email or web address].

## 2023-09-13 NOTE — Plan of Care (Signed)
   Problem: Activity: Goal: Risk for activity intolerance will decrease Outcome: Progressing   Problem: Pain Managment: Goal: General experience of comfort will improve and/or be controlled Outcome: Progressing   Problem: Safety: Goal: Ability to remain free from injury will improve Outcome: Progressing

## 2023-09-13 NOTE — Progress Notes (Signed)
 PHARMACY CONSULT NOTE FOR:  OUTPATIENT  PARENTERAL ANTIBIOTIC THERAPY (OPAT)  Indication: Psoas abscess Regimen: Daptomycin  450 mg IV every 24 hours End date: 10/24/23  IV antibiotic discharge orders are pended. To discharging provider:  please sign these orders via discharge navigator,  Select New Orders & click on the button choice - Manage This Unsigned Work.     Thank you for allowing pharmacy to be a part of this patient's care.  Almarie Lunger, PharmD, BCPS, BCIDP Infectious Diseases Clinical Pharmacist 09/13/2023 1:14 PM   **Pharmacist phone directory can now be found on [email or web address] ([email or web address]).  Listed under [facility].

## 2023-09-13 NOTE — TOC Progression Note (Addendum)
 Transition of Care [facility]) - Progression Note    Patient Details  Name: April Shaffer MRN: [id number] Date of Birth: 01-03-1951  Transition [facility]) CM/SW Contact  Alfonse JONELLE Rex, RN Phone Number: 09/13/2023, 10:58 AM  Clinical Narrative:   ID note 7/17- plan 4-6 wks iv abx, awaiting final cultures. Pam w/[facility] notified.   -1:00 [facility], rep Baker, accepted for [facility] RN, added to AVS.     Expected Discharge Plan: Home/Self Care Barriers to Discharge: Continued Medical Work up  Expected Discharge Plan and Services       Living arrangements for the past 2 months: Single Family Home                                       Social Determinants of Health (SDOH) Interventions SDOH Screenings   Food Insecurity: No Food Insecurity (09/11/2023)  Housing: Low Risk  (09/11/2023)  Transportation Needs: No Transportation Needs (09/11/2023)  Utilities: Not At Risk (09/11/2023)  Depression (PHQ2-9): Low Risk  (07/05/2022)  Financial Resource Strain: Low Risk  (08/06/2018)  Physical Activity: Inactive (06/17/2017)  Social Connections: Unknown (09/11/2023)  Stress: No Stress Concern Present (06/17/2017)  Tobacco Use: High Risk (09/12/2023)    Readmission Risk Interventions    05/10/2022    3:52 PM  Readmission Risk Prevention Plan  Transportation Screening Complete  PCP or Specialist Appt within 3-5 Days Complete  HRI or Home Care Consult Complete  Social Work Consult for Recovery Care Planning/Counseling Complete  Palliative Care Screening Not Applicable  Medication Review [employer]) Complete

## 2023-09-13 NOTE — Plan of Care (Signed)

## 2023-09-14 ENCOUNTER — Other Ambulatory Visit (HOSPITAL_COMMUNITY): Payer: Self-pay

## 2023-09-14 ENCOUNTER — Encounter (HOSPITAL_COMMUNITY): Payer: Self-pay | Admitting: Family Medicine

## 2023-09-14 DIAGNOSIS — M6289 Other specified disorders of muscle: Secondary | ICD-10-CM | POA: Diagnosis not present

## 2023-09-14 MED ORDER — HEPARIN SOD (PORK) LOCK FLUSH 100 UNIT/ML IV SOLN
250.0000 [IU] | INTRAVENOUS | Status: AC | PRN
  Administered 2023-09-14: 250 [IU]

## 2023-09-14 NOTE — Discharge Instructions (Signed)
 Interventional Radiology Percutaneous Abscess Drain Placement After Care   This sheet gives you information about how to care for yourself after your procedure. Your health care provider may also give you more specific instructions. Your drain was placed by an interventional radiologist with [facility]. If you have questions or concerns, contact [facility] at [phone].   What is a percutaneous drain?   A drain is a small plastic tube (catheter) that goes into the fluid collection in your body through your skin.   How long will I need the drain?   How long the drain needs to stay in is determined by where the drain is, how much comes out of the drain each day and if you are having any other surgical procedures.   Interventional radiology will determine when it is time to remove the drain. It is important to follow up as directed so that the drain can be removed as soon as it is safe to do so.   What can I expect after the procedure?   After the procedure, it is common to have:   A small amount of bruising and discomfort in the area where the drainage tube (catheter) was placed.   Sleepiness and fatigue. This should go away after the medicines you were given have worn off.   Follow these instructions at home:   Insertion site care   Check your insertion site when you change the bandage. Check for:   More redness, swelling, or pain.   More fluid or blood.   Warmth.   Pus or a bad smell.   When caring for your insertion site:   Wash your hands with soap and water for at least 20 seconds before and after you change your bandage (dressing). If soap and water are not available, use hand sanitizer.   You do not need to change your dressing everyday if it is clean and dry. Change your dressing every 3 days or as needed when it is soiled, wet or becoming dislodged. You will need to change your dressing each time you shower.   Leave stitches (sutures), skin  glue, or adhesive strips in place. These skin closures may need to stay in place for 2 weeks or longer. If adhesive strip edges start to loosen and curl up, you may trim the loose edges. Do not remove adhesive strips completely unless your health care provider tells you to do so.   Catheter care   Flush the catheter once per day with 5 mL of 0.9% normal saline unless you are told otherwise by your healthcare provider. This helps to prevent clogs in the catheter.   To disconnect the drain, turn the clear plastic tube to the left. Attach the saline syringe by placing it on the white end of the drain and turning gently to the right. Once attached gently push the plunger to the 5 mL mark. After you are done flushing, disconnect the syringe by turning to the left and reattach your drainage container   If you have a bulb please be sure the bulb is charged after reconnecting it - to do this pinch the bulb between your thumb and first finger and close the stopper located on the top of the bulb.    Check for fluid leaking from around your catheter (instead of fluid draining through your catheter). This may be a sign that the drain is no longer working correctly.   Write down the following information every time you empty your  bag:   The date and time.   The amount of drainage.   Activity   Rest at home for 1-2 days after your procedure.   For the first 48 hours do not lift anything more than 10 lbs (about a gallon of milk). You may perform moderate activities/exercise. Please avoid strenuous activities during this time.   Avoid any activities which may pull on your drain as this can cause your drain to become dislodged.   If you were given a sedative during the procedure, it can affect you for several hours. Do not drive or operate machinery until your health care provider says that it is safe.   General instructions   For mild pain take over-the-counter medications as needed for pain such as  Tylenol or Advil. If you are experiencing severe pain please call our office as this may indicate an issue with your drain.    If you were prescribed an antibiotic medicine, take it as told by your health care provider. Do not stop using the antibiotic even if you start to feel better.   You may shower 24 hours after the drain is placed. To do this cover the insertion site with a water tight material such as saran wrap and seal the edges with tape, you may also purchase waterproof dressings at your local drug store. Shower as usual and then remove the water tight dressing and any gauze/tape underneath it once you have exited the shower and dried off. Allow the area to air dry or pat dry with a clean towel. Once the skin is completely dry place a new gauze dressing. It is important to keep the site dry at all times to prevent infection.   Do not submerge the drain - this means you cannot take baths, swim, use a hot tub, etc. until the drain is removed.    Do not use any products that contain nicotine or tobacco, such as cigarettes, e-cigarettes, and chewing tobacco. If you need help quitting, ask your health care provider.   Keep all follow-up visits as told by your health care provider. This is important.   Contact a health care provider if:   You have less than 10 mL of drainage a day for 2-3 days in a row, or as directed by your health care provider.   You have any of these signs of infection:   More redness, swelling, or pain around your incision area.   More fluid or blood coming from your incision area.   Warmth coming from your incision area.   Pus or a bad smell coming from your incision area.   You have fluid leaking from around your catheter (instead of through your catheter).   You are unable to flush the drain.   You have a fever or chills.   You have pain that does not get better with medicine.   You have not been contacted to schedule a drain follow up appointment  within 10 days of discharge from the hospital.   Please call [facility] at [phone] with any questions or concerns.   Get help right away if:   Your catheter comes out.   You suddenly stop having drainage from your catheter.   You suddenly have blood in the fluid that is draining from your catheter.   You become dizzy or you faint.   You develop a rash.   You have nausea or vomiting.   You have difficulty breathing or you feel short  of breath.   You develop chest pain.   You have problems with your speech or vision.   You have trouble balancing or moving your arms or legs.   Summary   It is common to have a small amount of bruising and discomfort in the area where the drainage tube (catheter) was placed. You may also have minor discomfort with movement while the drain is in place.   Flush the drain once per day with 5 mL of 0.9% normal saline (unless you were told otherwise by your healthcare provider).    Record the amount of drainage from the bag every time you empty it.   Change the dressing every 3 days or earlier if soiled/wet. Keep the skin dry under the dressing.   You may shower with the drain in place. Do not submerge the drain (no baths, swimming, hot tubs, etc.).   Contact [facility] at [phone] if you have more redness, swelling, or pain around your incision area or if you have pain that does not get better with medicine.   This information is not intended to replace advice given to you by your health care provider. Make sure you discuss any questions you have with your health care provider.   Document Revised: 05/18/2021 Document Reviewed: 02/07/2019   [employer]vier Patient Education  2023 [employer]         Interventional Radiology Drain Record   Empty your drain at least once per day. You may empty it as often as needed. Use this form to write down the amount of fluid that has collected in the drainage container. Bring  this form with you to your follow-up visits. Please call [facility] at [phone] with any questions or concerns prior to your appointment.   Drain #1 location: ___________________   Date __________ Time __________ Amount __________   Date __________ Time __________ Amount __________   Date __________ Time __________ Amount __________   Date __________ Time __________ Amount __________   Date __________ Time __________ Amount __________   Date __________ Time __________ Amount __________   Date __________ Time __________ Amount __________   Date __________ Time __________ Amount __________   Date __________ Time __________ Amount __________   Date __________ Time __________ Amount __________   Date __________ Time __________ Amount __________   Date __________ Time __________ Amount __________   Date __________ Time __________ Amount __________   Date __________ Time __________ Amount __________

## 2023-09-14 NOTE — Discharge Summary (Addendum)
 Physician Discharge Summary   Patient: April Shaffer MRN: [id number] DOB: [birth year]  Admit date:     09/11/2023  Discharge date: 09/14/23  Discharge Physician: Toribio Door   PCP: Claudene Prentice DELENA Mickey., FNP   Recommendations at discharge:   Follow-up treatment for psoas muscle abscess  Discharge Diagnoses: Principal Problem:   Psoas abscess, right (HCC) Active Problems:   DM type 2 with diabetic peripheral neuropathy (HCC)   Essential hypertension   S/P lumbar fusion   S/P total left hip arthroplasty   Hardware complicating wound infection (HCC)  Resolved Problems:   * No resolved hospital problems. *  Hospital Course: 73 year old woman presented with right psoas muscle fluid collection.  Admitted for further evaluation.  Underwent aspiration of purulent fluid.  Seen by infectious disease, discharged home with PICC line, outpatient antibiotic regimen.  Consultants IR Neurosurgery   Procedures/Events 7/17 CT guided drain placement right psoas fluid collection  Right psoas muscle abscess CT abnormalities spinal fusion hardware, sacroiliac region Status post aspiration and drain placement Empiric antibiotics started, infectious disease consulted IV antibiotics per ID with plan for lifelong suppressive oral antibiotics to follow. Follow-up with infectious disease Per IR once daily flush of abdominal drain with 5 cc sterile saline, output recording and gauze dressing changes every 2 to 3 days.  Patient instructed on how to flush drain.  Pt will be scheduled for follow-up in IR drain clinic in 10 to 14 days.  Discussed CT findings with Dr. Alm Molt neurosurgery who reviewed films, recommended conservative management with IV antibiotics, no surgical intervention  Normocytic anemia In 8s earlier this year, down to 6.8, probably acute/chronic disease No bleeding Hgb up appropriately s/p transfusion.  Hemoglobin remained stable.   Diabetes mellitus type 2 Hemoglobin A1c  within the last 3 months was 6.7. CBG stable   Essential hypertension Stable   Hypokalemia--resolved   Hyperlipidemia Not on statins.   Sleep apnea History of status he does not have it anymore.  Not on CPAP.  Disposition: Home health Diet recommendation:  Regular diet DISCHARGE MEDICATION: Allergies as of 09/14/2023       Reactions   Zestoretic  [lisinopril -hydrochlorothiazide ] Swelling, Other (See Comments)   Angioedema + Tongue swelling    Codeine Itching   Oxycodone  Itching, Other (See Comments)   Patient is taking in 2025   Ultram  [tramadol ] Itching   Zocor  [simvastatin ] Other (See Comments)   Muscle pain        Medication List     STOP taking these medications    allopurinol  300 MG tablet Commonly known as: ZYLOPRIM    methocarbamol  750 MG tablet Commonly known as: ROBAXIN    oxyCODONE  5 MG immediate release tablet Commonly known as: Roxicodone        TAKE these medications    acetaminophen  500 MG tablet Commonly known as: TYLENOL  Take 500 mg by mouth every 6 (six) hours as needed for mild pain (pain score 1-3), moderate pain (pain score 4-6) or headache.   amLODipine  10 MG tablet Commonly known as: NORVASC  Take 10 mg by mouth in the morning.   aspirin  EC 81 MG tablet Take 1 tablet (81 mg total) by mouth 2 (two) times daily. To prevent blood clots for 30 days after surgery.   carvedilol  10 MG 24 hr capsule Commonly known as: COREG  CR Take 10 mg by mouth in the morning.   clopidogrel  75 MG tablet Commonly known as: PLAVIX  Take 75 mg by mouth in the morning.   cyanocobalamin  1000 MCG/ML injection  Commonly known as: VITAMIN B12 Inject 1,000 mcg into the muscle every 30 (thirty) days.   daptomycin  IVPB Commonly known as: CUBICIN  Inject 450 mg into the vein daily. Indication:  Psoas abscess First Dose: Yes Last Day of Therapy:  10/24/23 Labs - Once weekly:  CBC/D, BMP, and CPK Labs - Once weekly: ESR and CRP Method of administration: IV  Push Method of administration may be changed at the discretion of home infusion pharmacist based upon assessment of the patient and/or caregiver's ability to self-administer the medication ordered.   ezetimibe  10 MG tablet Commonly known as: ZETIA  TAKE 1 TABLET EVERY DAY (NEED MD APPOINTMENT) What changed: See the new instructions.   gabapentin  300 MG capsule Commonly known as: NEURONTIN  Take 300 mg by mouth in the morning, at noon, and at bedtime.   glucose blood test strip Check sugars twice daily   hydrochlorothiazide  12.5 MG tablet Commonly known as: HYDRODIURIL  Take 12.5 mg by mouth in the morning.   metFORMIN  750 MG 24 hr tablet Commonly known as: GLUCOPHAGE -XR Take 750 mg by mouth in the morning.   Normal Saline Flush 0.9 % Soln Flush with 5-10mL of saline daily to prevent clogging   ondansetron  4 MG disintegrating tablet Commonly known as: ZOFRAN -ODT Take 1 tablet (4 mg total) by mouth every 8 (eight) hours as needed for nausea or vomiting. What changed: reasons to take this   ondansetron  4 MG tablet Commonly known as: ZOFRAN  Take 4 mg by mouth every 8 (eight) hours as needed for nausea or vomiting.   oxyCODONE -acetaminophen  10-325 MG tablet Commonly known as: PERCOCET Take 1 tablet by mouth every 4 (four) hours as needed for pain. What changed:  when to take this additional instructions   sennosides-docusate sodium  8.6-50 MG tablet Commonly known as: SENOKOT-S Take 2 tablets by mouth daily as needed for constipation (while taking narcotics).               Discharge Care Instructions  (From admission, onward)           Start     Ordered   09/14/23 0000  Discharge wound care:       Comments: Once daily flush of abdominal drain with 5 cc sterile saline, record output. Gauze dressing changes every 2 to 3 days.   09/14/23 1133   09/13/23 0000  Change dressing on IV access line weekly and PRN  (Home infusion instructions - [facility] )         09/13/23 1342            Follow-up Information     [facility]  Follow up.   Why: [facility] (RN) for PICC line care Contact information: [address] [place] [address] [phone]         Claudene Prentice DELENA Mickey., FNP Follow up.   Specialty: Family Medicine Why: As needed Contact information: [address] [place] [address] [phone]         [facility]INTER[facility]OLOG[facility] Follow up.   Specialty: Radiology Why: Office will call you with an appointment to follow-up on your drain (Interventional Radiology [facility]) Contact information: [address] [place]  [address] [phone]        [facility]              Follow up on 10/01/2023.   Why: 2pm Contact information: [address] [place]  [address]               Discharge Exam: Filed Weights   09/11/23 1225 09/13/23 0500 09/14/23 0609  Weight: 51.3 kg 49.6 kg 48.1 kg   Physical Exam Vitals reviewed.  Constitutional:      General: She is not in acute distress.    Appearance: She is not ill-appearing or toxic-appearing.  Cardiovascular:     Rate and Rhythm: Normal rate and regular rhythm.     Heart sounds: No murmur heard. Pulmonary:     Effort: Pulmonary effort is normal. No respiratory distress.     Breath sounds: No wheezing, rhonchi or rales.  Neurological:     Mental Status: She is alert.  Psychiatric:        Mood and Affect: Mood normal.        Behavior: Behavior normal.      Condition at discharge: good  The results of significant diagnostics from this hospitalization (including imaging, microbiology, ancillary and laboratory) are listed below for reference.   Imaging Studies: US  EKG SITE RITE Result Date: 09/13/2023 If Site Rite image not attached, placement could not be confirmed due to  current cardiac rhythm.  CT GUIDED PERITONEAL/RETROPERITONEAL FLUID DRAIN BY PERC CATH Result Date: 09/12/2023 INDICATION: 73 year old with a right psoas fluid collection. Patient presents for CT-guided aspiration or drainage EXAM: CT-GUIDED PLACEMENT OF DRAINAGE CATHETER IN RIGHT PSOAS ABSCESS TECHNIQUE: Multidetector CT imaging of the lower abdomen and pelvis was performed following the standard protocol with/without IV contrast. RADIATION DOSE REDUCTION: This exam was performed according to the departmental dose-optimization program which includes automated exposure control, adjustment of the mA and/or kV according to patient size and/or use of iterative reconstruction technique. MEDICATIONS: Moderate sedation ANESTHESIA/SEDATION: Moderate (conscious) sedation was employed during this procedure. A total of Versed  3 mg and Fentanyl  100 mcg was administered intravenously by the radiology nurse. Total intra-service moderate Sedation Time: 19 minutes. The patient's level of consciousness and vital signs were monitored continuously by radiology nursing throughout the procedure under my direct supervision. COMPLICATIONS: None immediate. PROCEDURE: Informed written consent was obtained from the patient after a thorough discussion of the procedural risks, benefits and alternatives. All questions were addressed. Maximal Sterile Barrier Technique was utilized including caps, mask, sterile gowns, sterile gloves, sterile drape, hand hygiene and skin antiseptic. A timeout was performed prior to the initiation of the procedure. Patient was placed supine on the CT scanner with the right side rolled up. CT images through the lower abdomen and pelvis were obtained. The large right psoas fluid collection was identified and targeted. The right side of the lower abdomen was prepped with chlorhexidine  and sterile field was created. Skin was anesthetized using 1% lidocaine . Small incision was made. Using CT guidance, an 18 gauge  trocar needle was directed into the fluid collection. Opaque yellow fluid was aspirated. Superstiff Amplatz wire was advanced into the collection and the tract was dilated to accommodate a 10 [place] multipurpose drain. 75 mL of blood tinged yellow purulent fluid was aspirated. Follow up CT images were obtained. Drain was sutured to the skin and attached to a suction bag. FINDINGS: Large fluid collection involving the right psoas. 10 French drain was placed in the collection and 75 mL of blood tinged purulent fluid was removed. The collection was decompressed at the end of the procedure. IMPRESSION: CT-guided placement of a drainage catheter in the right psoas abscess. Electronically Signed   By: Juliene Philip HERO.D.  On: 09/12/2023 14:34   CT ABDOMEN PELVIS W CONTRAST Result Date: 09/11/2023 CLINICAL DATA:  Psoas abscess on MRI EXAM: CT ABDOMEN AND PELVIS WITH CONTRAST TECHNIQUE: Multidetector CT imaging of the abdomen and pelvis was performed using the standard protocol following bolus administration of intravenous contrast. RADIATION DOSE REDUCTION: This exam was performed according to the departmental dose-optimization program which includes automated exposure control, adjustment of the mA and/or kV according to patient size and/or use of iterative reconstruction technique. CONTRAST:  OMNIPAQUE  IOHEXOL  300 MG/ML  SOLN COMPARISON:  MRI lumbar spine 09/06/2023, prior lumbar MRI 11/30/2022, bone scan 04/30/2022, CT 04/23/2022, 07/18/2021 FINDINGS: Lower chest: Lung bases demonstrate no acute airspace disease. Coronary vascular calcification. Hepatobiliary: No focal liver abnormality is seen. No gallstones, gallbladder wall thickening, or biliary dilatation. Pancreas: Unremarkable. No pancreatic ductal dilatation or surrounding inflammatory changes. Spleen: Normal in size without focal abnormality. Adrenals/Urinary Tract: Adrenal glands are normal. Kidneys show no hydronephrosis. Bilateral renal cysts for which  no imaging follow-up is recommended. Bladder is unremarkable global allowing for artifact. Stomach/Bowel: The stomach is nonenlarged. There is no dilated small bowel. No acute bowel wall thickening. Vascular/Lymphatic: Advanced aortic atherosclerosis. No aneurysm. No suspicious lymph nodes. Reproductive: Hysterectomy.  No adnexal mass. Other: Negative for pelvic effusion or free air. Musculoskeletal: Extensive posterior spinal fusion hardware, extends from L1 through the sacrum with bilateral fixating screws across the SI joints. Residual listhesis at L3-L4 and L4-L5. Prominent lucency surrounding the transpedicular screws bilaterally at L1 and L2. Prominent lucency around bilateral S1 fixating screws as well as right greater than left SI joint fixating screws. As seen on recent lumbar spine MRI, large mildly rim enhancing fluid collection in the right psoas muscle, this measures approximately 5.2 x 4.1 by 8 cm. Additional rim enhancing fluid collection anterior to L4 on L5, partially obscured by artifact from hardware, this measures approximately 3.2 by 2 cm on axial series 12, image 45, by 4.3 cm craniocaudal on sagittal series 10, image 91. Prevertebral rim enhancing collection exhibits mass effect on the common iliac veins. There is mild stranding within the retroperitoneum at this level. Low-density appearance of left psoas muscle, difficult to exclude edema. Compared 2 previous exams, progressive widening of the bilateral SI joints with irregular margin. Progressive heterogeneous sclerosis in the sacrum and iliac bones abutting the SI joints. IMPRESSION: 1. Extensive posterior spinal fusion hardware extending from L1 through the sacrum with bilateral fixating screws across the SI joints. Again visualized is prominent lucency surrounding the transpedicular screws bilaterally at L1 and L2, bilateral S1 fixating screws as well as right greater than left SI joint fixating screws, findings consistent with hardware  loosening and/or infection. 2. As seen on recent lumbar spine MRI, large mildly rim enhancing fluid collection in the right psoas muscle measuring up to 8 cm, suspicious for an abscess in the appropriate clinical setting. Additional rim enhancing fluid collection anterior to L4 and L5, partially obscured by artifact from hardware, measuring up to 4.3 cm, also suspect for abscess. Low-density appearance of the left psoas muscle, potentially due to edema. 3. 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 4. Aortic atherosclerosis. Aortic Atherosclerosis (ICD10-I70.0). Electronically Signed   By: Luke Bun M.D.   On: 09/11/2023 19:08   MR Lumbar Spine W Wo Contrast Result Date: 09/06/2023 CLINICAL DATA:  Incision wound in back EXAM: MRI LUMBAR SPINE WITHOUT AND WITH CONTRAST TECHNIQUE: Multiplanar and multiecho pulse sequences of the lumbar spine were obtained without and with intravenous contrast. CONTRAST:  5  mL of vueway  IV COMPARISON:  MRI of the lumbar spine dated 11/30/2022 FINDINGS: Segmentation: Standard. Alignment:  Physiologic lumbar alignment is maintained. Vertebrae: Operative changes of posterior decompression and fusion from L1 through the sacrum. There is interbody fusion at L1-L2, L2-L3, L3-L4, and L4-L5. Abutment metal hardware artifact obscures portions of the canal and posterior soft tissues. No compression fractures. Conus medullaris and cauda equina: The conus medullaris terminates at the level of L1-L2. The distal spinal cord signal intensity is normal. Paraspinal and other soft tissues: Renal cysts bilaterally. Fluid collection in the right psoas muscle measuring up to 9-10 cm in craniocaudal dimension. There is also a prevertebral fluid collection at L4-L5. Findings  may represent postoperative hematomas or seromas. There is also abundant edema in the psoas musculature bilaterally. Additional fluid collection in the superficial posterior soft tissue at the level of L1 measuring up to 3.5 cm. The visualized aorta is normal. Disc levels: L1-L2: Interbody fusion. Facets are obscured. Mild bilateral neuroforaminal stenosis. No spinal canal stenosis. L2-L3: Interbody fusion. Facets are obscured. Moderate bilateral neuroforaminal stenosis. No spinal canal stenosis. L3-L4: Interbody fusion. Facets are obscured. Moderate bilateral neuroforaminal stenosis. Moderate spinal canal stenosis. L4-L5: Interbody fusion. Facets and foramina are obscured. At least moderate canal stenoses, evaluation degraded by metal hardware artifact. L5-S1: Mild disc bulge. Facets and foramina are obscured. At least mild canal stenosis. IMPRESSION: 1. Operative changes of posterior decompression and fusion from L1 through the sacrum. Interbody fusion at L1-L2, L2-L3, L3-L4, and L4-L5. Abutment metal hardware artifact obscures portions of the canal and posterior soft tissues. 2. Fluid collection in the right psoas muscle measuring up to 9-10 cm in craniocaudal dimension. There is also a prevertebral fluid collection at L4-L5. Findings may represent postoperative hematomas or seromas. Additional fluid collection in the superficial posterior soft tissue at the level of L1, may also represent a postoperative seroma or hematoma. Superimposed infections in the collections is not excluded on imaging. 3. 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 Electronically Signed   By: Clem Savory M.D.   On: 09/06/2023 14:39    Microbiology: Results for orders placed or performed during the hospital encounter of 09/11/23  Culture, blood (routine x 2)     Status: None (Preliminary result)   Collection Time: 09/11/23  4:24 PM   Specimen: BLOOD  Result Value Ref Range  Status   Specimen Description   Final    BLOOD LEFT ANTECUBITAL Performed at [facility], [address], [place], [place] [address]    Special Requests   Final    BOTTLES DRAWN AEROBIC AND ANAEROBIC Blood Culture adequate volume Performed at [facility], [address] [place], [place] [address]    Culture   Final    NO GROWTH 3 DAYS Performed at [facility], [address], [place], [place] [address]    Report Status PENDING  Incomplete  Culture, blood (routine x 2)     Status: None (Preliminary result)   Collection Time: 09/11/23  4:46 PM   Specimen: BLOOD LEFT ARM  Result Value Ref Range Status   Specimen Description   Final    BLOOD LEFT ARM Performed at [facility], [address], [place], [place] [address]  Special Requests   Final    BOTTLES DRAWN AEROBIC AND ANAEROBIC Blood Culture adequate volume Performed at [facility], [address], [place], [place] [address]    Culture   Final    NO GROWTH 3 DAYS Performed at [facility], [address] [place], [place] [address]    Report Status PENDING  Incomplete  Aerobic/Anaerobic Culture w Gram Stain (surgical/deep wound)     Status: None (Preliminary result)   Collection Time: 09/12/23 12:25 PM   Specimen: Abscess  Result Value Ref Range Status   Specimen Description   Final    ABSCESS Performed at [facility], [address], [place], [place] [address]    Special Requests   Final    NONE Performed at [facility], [address] [place], [place] [address]    Gram Stain   Final    ABUNDANT WBC PRESENT,BOTH PMN AND MONONUCLEAR ABUNDANT GRAM POSITIVE COCCI IN CLUSTERS Performed at [facility], [address], [place], [place] [address]    Culture   Final    ABUNDANT STAPHYLOCOCCUS AUREUS CULTURE REINCUBATED FOR BETTER GROWTH NO ANAEROBES ISOLATED; CULTURE IN PROGRESS FOR 5 DAYS     Report Status PENDING  Incomplete    Labs: CBC: Recent Labs  Lab 09/10/23 1245 09/11/23 1605 09/12/23 0536 09/12/23 1720 09/13/23 0343  WBC 12.1* 12.0* 12.3*  --  14.2*  NEUTROABS 9.3* 8.5*  --   --   --   HGB 8.2* 8.1* 6.8* 8.8* 8.7*  HCT 27.0* 27.1* 22.5* 28.6* 28.0*  MCV 81.1 81.9 80.6  --  82.6  PLT 484* 500* 414*  --  420*   Basic Metabolic Panel: Recent Labs  Lab 09/10/23 1245 09/11/23 1605 09/12/23 0352  NA 134* 135 136  K 3.1* 3.3* 3.5  CL 102 103 105  CO2 22 23 23   GLUCOSE 151* 131* 92  BUN 12 16 16   CREATININE 1.04* 0.97 0.98  CALCIUM  9.0 8.7* 8.2*  MG  --   --  2.0   Liver Function Tests: Recent Labs  Lab 09/10/23 1245 09/11/23 1605  AST 11* 11*  ALT 8 6  ALKPHOS 93 93  BILITOT 0.4 0.5  PROT 9.6* 9.2*  ALBUMIN  2.2* 2.4*   CBG: No results for input(s): GLUCAP in the last 168 hours.  Discharge time spent: less than 30 minutes.  Signed: Toribio Door, MD [facility] 09/14/2023

## 2023-09-14 NOTE — Plan of Care (Signed)
  Problem: Education: Goal: Knowledge of General Education information will improve Description: Including pain rating scale, medication(s)/side effects and non-pharmacologic comfort measures Outcome: Adequate for Discharge   Problem: Health Behavior/Discharge Planning: Goal: Ability to manage health-related needs will improve Outcome: Adequate for Discharge   Problem: Clinical Measurements: Goal: Ability to maintain clinical measurements within normal limits will improve Outcome: Progressing Goal: Will remain free from infection Outcome: Progressing Goal: Diagnostic test results will improve Outcome: Progressing Goal: Respiratory complications will improve Outcome: Progressing Goal: Cardiovascular complication will be avoided Outcome: Adequate for Discharge   Problem: Activity: Goal: Risk for activity intolerance will decrease Outcome: Adequate for Discharge   Problem: Nutrition: Goal: Adequate nutrition will be maintained Outcome: Progressing   Problem: Coping: Goal: Level of anxiety will decrease Outcome: Progressing   Problem: Elimination: Goal: Will not experience complications related to bowel motility Outcome: Completed/Met Goal: Will not experience complications related to urinary retention Outcome: Completed/Met   Problem: Pain Managment: Goal: General experience of comfort will improve and/or be controlled Outcome: Progressing   Problem: Safety: Goal: Ability to remain free from injury will improve Outcome: Progressing   Problem: Skin Integrity: Goal: Risk for impaired skin integrity will decrease Outcome: Adequate for Discharge

## 2023-09-14 NOTE — Progress Notes (Signed)
 Referring Physician(s): Neysa DASEN  Supervising Physician: Philip Cornet  Patient Status:  [facility] - In-pt  Chief Complaint: Right psoas abscess; status post drain placement on 7/17   Subjective: Patient doing okay this a.m.  Eating breakfast.  Has had some loose stools.  Denies fever/chills, worsening abdominal pain, nausea, vomiting   Allergies: Zestoretic  [lisinopril -hydrochlorothiazide ], Codeine, Oxycodone , Ultram  [tramadol ], and Zocor  [simvastatin ]  Medications: Prior to Admission medications   Medication Sig Start Date End Date Taking? Authorizing Provider  acetaminophen  (TYLENOL ) 500 MG tablet Take 500 mg by mouth every 6 (six) hours as needed for mild pain (pain score 1-3), moderate pain (pain score 4-6) or headache.   Yes [provider]  amLODipine  (NORVASC ) 10 MG tablet Take 10 mg by mouth in the morning. 06/18/23  Yes [provider]  aspirin  EC 81 MG tablet Take 1 tablet (81 mg total) by mouth 2 (two) times daily. To prevent blood clots for 30 days after surgery. 07/17/23  Yes Gawne, Meghan M, PA-C  carvedilol  (COREG  CR) 10 MG 24 hr capsule Take 10 mg by mouth in the morning. 05/11/23  Yes [provider]  clopidogrel  (PLAVIX ) 75 MG tablet Take 75 mg by mouth in the morning.   Yes [provider]  cyanocobalamin  (VITAMIN B12) 1000 MCG/ML injection Inject 1,000 mcg into the muscle every 30 (thirty) days.   Yes [provider]  daptomycin  (CUBICIN ) IVPB Inject 450 mg into the vein daily. Indication:  Psoas abscess First Dose: Yes Last Day of Therapy:  10/24/23 Labs - Once weekly:  CBC/D, BMP, and CPK Labs - Once weekly: ESR and CRP Method of administration: IV Push Method of administration may be changed at the discretion of home infusion pharmacist based upon assessment of the patient and/or caregiver's ability to self-administer the medication ordered. 09/13/23 10/24/23 Yes Vu, Constance DASEN, MD  ezetimibe  (ZETIA ) 10 MG tablet TAKE 1 TABLET  EVERY DAY (NEED MD APPOINTMENT) Patient taking differently: Take 10 mg by mouth daily. 09/27/20  Yes Court Dorn PARAS, MD  gabapentin  (NEURONTIN ) 300 MG capsule Take 300 mg by mouth in the morning, at noon, and at bedtime. 06/09/21  Yes [provider]  hydrochlorothiazide  (HYDRODIURIL ) 12.5 MG tablet Take 12.5 mg by mouth in the morning.   Yes [provider]  metFORMIN  (GLUCOPHAGE -XR) 750 MG 24 hr tablet Take 750 mg by mouth in the morning. 06/07/23  Yes [provider]  ondansetron  (ZOFRAN ) 4 MG tablet Take 4 mg by mouth every 8 (eight) hours as needed for nausea or vomiting. 05/20/23  Yes [provider]  ondansetron  (ZOFRAN -ODT) 4 MG disintegrating tablet Take 1 tablet (4 mg total) by mouth every 8 (eight) hours as needed for nausea or vomiting. Patient taking differently: Take 4 mg by mouth every 8 (eight) hours as needed for nausea or vomiting (DISSOLVE ORALLY). 07/17/23  Yes Gawne, Meghan M, PA-C  oxyCODONE -acetaminophen  (PERCOCET) 10-325 MG tablet Take 1 tablet by mouth every 4 (four) hours as needed for pain. Patient taking differently: Take 1 tablet by mouth See admin instructions. Take 1 tablet by mouth every 6-8 hours as needed for pain 12/03/20  Yes Joshua Alm Hamilton, MD  sennosides-docusate sodium  (SENOKOT-S) 8.6-50 MG tablet Take 2 tablets by mouth daily as needed for constipation (while taking narcotics). 07/17/23  Yes Gawne, Meghan M, PA-C  sodium chloride  flush 0.9 % SOLN injection Flush with 5-10mL of saline daily to prevent clogging 09/13/23  Yes McInnis, Caitlyn M, PA-C  allopurinol  (ZYLOPRIM ) 300 MG  tablet Take 1 tablet (300 mg total) by mouth daily. Patient not taking: Reported on 09/11/2023 07/09/19   Adella Norris, MD  glucose blood test strip Check sugars twice daily 01/31/16   Adella Norris, MD  methocarbamol  (ROBAXIN ) 750 MG tablet Take 1 tablet (750 mg total) by mouth every 8 (eight) hours as needed for muscle spasms. Patient not  taking: Reported on 09/11/2023 07/17/23   Gawne, Meghan M, PA-C  oxyCODONE  (ROXICODONE ) 5 MG immediate release tablet Take 1 tablet (5 mg total) by mouth every 4 (four) hours as needed for severe pain (pain score 7-10) or breakthrough pain. after hip surgery that is not controlled with your normal daily dose of Percocet for chronic pain Patient not taking: Reported on 09/11/2023 07/17/23   Ted Gerard HERO, PA-C     Vital Signs: BP 122/70 (BP Location: Left Arm)   Pulse 67   Temp 98.3 F (36.8 C) (Oral)   Resp 20   Ht 5' 3 (1.6 m)   Wt 106 lb 0.7 oz (48.1 kg)   SpO2 100%   BMI 18.78 kg/m   Physical Exam awake, alert.  Right lower quadrant drain intact, insertion site okay, mildly tender to palpation, output 45 cc blood-tinged fluid.  Drain flushed without difficulty.  Imaging: US  EKG SITE RITE Result Date: 09/13/2023 If Site Rite image not attached, placement could not be confirmed due to current cardiac rhythm.  CT GUIDED PERITONEAL/RETROPERITONEAL FLUID DRAIN BY PERC CATH Result Date: 09/12/2023 INDICATION: 73 year old with a right psoas fluid collection. Patient presents for CT-guided aspiration or drainage EXAM: CT-GUIDED PLACEMENT OF DRAINAGE CATHETER IN RIGHT PSOAS ABSCESS TECHNIQUE: Multidetector CT imaging of the lower abdomen and pelvis was performed following the standard protocol with/without IV contrast. RADIATION DOSE REDUCTION: This exam was performed according to the departmental dose-optimization program which includes automated exposure control, adjustment of the mA and/or kV according to patient size and/or use of iterative reconstruction technique. MEDICATIONS: Moderate sedation ANESTHESIA/SEDATION: Moderate (conscious) sedation was employed during this procedure. A total of Versed  3 mg and Fentanyl  100 mcg was administered intravenously by the radiology nurse. Total intra-service moderate Sedation Time: 19 minutes. The patient's level of consciousness and vital signs were  monitored continuously by radiology nursing throughout the procedure under my direct supervision. COMPLICATIONS: None immediate. PROCEDURE: Informed written consent was obtained from the patient after a thorough discussion of the procedural risks, benefits and alternatives. All questions were addressed. Maximal Sterile Barrier Technique was utilized including caps, mask, sterile gowns, sterile gloves, sterile drape, hand hygiene and skin antiseptic. A timeout was performed prior to the initiation of the procedure. Patient was placed supine on the CT scanner with the right side rolled up. CT images through the lower abdomen and pelvis were obtained. The large right psoas fluid collection was identified and targeted. The right side of the lower abdomen was prepped with chlorhexidine  and sterile field was created. Skin was anesthetized using 1% lidocaine . Small incision was made. Using CT guidance, an 18 gauge trocar needle was directed into the fluid collection. Opaque yellow fluid was aspirated. Superstiff Amplatz wire was advanced into the collection and the tract was dilated to accommodate a 10 [place] multipurpose drain. 75 mL of blood tinged yellow purulent fluid was aspirated. Follow up CT images were obtained. Drain was sutured to the skin and attached to a suction bag. FINDINGS: Large fluid collection involving the right psoas. 10 French drain was placed in the collection and 75 mL of blood tinged purulent fluid was  removed. The collection was decompressed at the end of the procedure. IMPRESSION: CT-guided placement of a drainage catheter in the right psoas abscess. Electronically Signed   By: Juliene Balder M.D.   On: 09/12/2023 14:34   CT ABDOMEN PELVIS W CONTRAST Result Date: 09/11/2023 CLINICAL DATA:  Psoas abscess on MRI EXAM: CT ABDOMEN AND PELVIS WITH CONTRAST TECHNIQUE: Multidetector CT imaging of the abdomen and pelvis was performed using the standard protocol following bolus administration of  intravenous contrast. RADIATION DOSE REDUCTION: This exam was performed according to the departmental dose-optimization program which includes automated exposure control, adjustment of the mA and/or kV according to patient size and/or use of iterative reconstruction technique. CONTRAST:  OMNIPAQUE  IOHEXOL  300 MG/ML  SOLN COMPARISON:  MRI lumbar spine 09/06/2023, prior lumbar MRI 11/30/2022, bone scan 04/30/2022, CT 04/23/2022, 07/18/2021 FINDINGS: Lower chest: Lung bases demonstrate no acute airspace disease. Coronary vascular calcification. Hepatobiliary: No focal liver abnormality is seen. No gallstones, gallbladder wall thickening, or biliary dilatation. Pancreas: Unremarkable. No pancreatic ductal dilatation or surrounding inflammatory changes. Spleen: Normal in size without focal abnormality. Adrenals/Urinary Tract: Adrenal glands are normal. Kidneys show no hydronephrosis. Bilateral renal cysts for which no imaging follow-up is recommended. Bladder is unremarkable global allowing for artifact. Stomach/Bowel: The stomach is nonenlarged. There is no dilated small bowel. No acute bowel wall thickening. Vascular/Lymphatic: Advanced aortic atherosclerosis. No aneurysm. No suspicious lymph nodes. Reproductive: Hysterectomy.  No adnexal mass. Other: Negative for pelvic effusion or free air. Musculoskeletal: Extensive posterior spinal fusion hardware, extends from L1 through the sacrum with bilateral fixating screws across the SI joints. Residual listhesis at L3-L4 and L4-L5. Prominent lucency surrounding the transpedicular screws bilaterally at L1 and L2. Prominent lucency around bilateral S1 fixating screws as well as right greater than left SI joint fixating screws. As seen on recent lumbar spine MRI, large mildly rim enhancing fluid collection in the right psoas muscle, this measures approximately 5.2 x 4.1 by 8 cm. Additional rim enhancing fluid collection anterior to L4 on L5, partially obscured by  artifact from hardware, this measures approximately 3.2 by 2 cm on axial series 12, image 45, by 4.3 cm craniocaudal on sagittal series 10, image 91. Prevertebral rim enhancing collection exhibits mass effect on the common iliac veins. There is mild stranding within the retroperitoneum at this level. Low-density appearance of left psoas muscle, difficult to exclude edema. Compared 2 previous exams, progressive widening of the bilateral SI joints with irregular margin. Progressive heterogeneous sclerosis in the sacrum and iliac bones abutting the SI joints. IMPRESSION: 1. Extensive posterior spinal fusion hardware extending from L1 through the sacrum with bilateral fixating screws across the SI joints. Again visualized is prominent lucency surrounding the transpedicular screws bilaterally at L1 and L2, bilateral S1 fixating screws as well as right greater than left SI joint fixating screws, findings consistent with hardware loosening and/or infection. 2. As seen on recent lumbar spine MRI, large mildly rim enhancing fluid collection in the right psoas muscle measuring up to 8 cm, suspicious for an abscess in the appropriate clinical setting. Additional rim enhancing fluid collection anterior to L4 and L5, partially obscured by artifact from hardware, measuring up to 4.3 cm, also suspect for abscess. Low-density appearance of the left psoas muscle, potentially due to edema. 3. 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  4. Aortic atherosclerosis. Aortic Atherosclerosis (ICD10-I70.0). Electronically Signed   By: Luke Bun M.D.   On: 09/11/2023 19:08    Labs:  CBC: Recent Labs    09/10/23 1245 09/11/23 1605 09/12/23 0536 09/12/23 1720 09/13/23 0343  WBC 12.1* 12.0* 12.3*  --   14.2*  HGB 8.2* 8.1* 6.8* 8.8* 8.7*  HCT 27.0* 27.1* 22.5* 28.6* 28.0*  PLT 484* 500* 414*  --  420*    COAGS: Recent Labs    09/12/23 0944  INR 1.2    BMP: Recent Labs    07/08/23 1347 09/10/23 1245 09/11/23 1605 09/12/23 0352  NA 136 134* 135 136  K 3.0* 3.1* 3.3* 3.5  CL 100 102 103 105  CO2 23 22 23 23   GLUCOSE 106* 151* 131* 92  BUN 16 12 16 16   CALCIUM  8.5* 9.0 8.7* 8.2*  CREATININE 0.91 1.04* 0.97 0.98  GFRNONAA >60 57* >60 >60    LIVER FUNCTION TESTS: Recent Labs    09/10/23 1245 09/11/23 1605  BILITOT 0.4 0.5  AST 11* 11*  ALT 8 6  ALKPHOS 93 93  PROT 9.6* 9.2*  ALBUMIN  2.2* 2.4*    Assessment and Plan: Right Psoas Abscess: April Shaffer is a 73 y.o. female with a history of spinal surgery in 2022 found to have a right psoas abscess. Patient underwent drain placement by IR on 7/17 with Dr. DELENA Balder ; afebrile, no new labs, drain fluid cultures with Staph aureus; as outpatient recommend once daily flush of abdominal drain with 5 cc sterile saline, output recording and gauze dressing changes every 2 to 3 days.  Patient instructed on how to flush drain.  Pt will be scheduled for follow-up in IR drain clinic in 10 to 14 days.   Electronically Signed: D. Franky Rakers, PA-C 09/14/2023, 8:39 AM   I spent a total of 15 Minutes at the the patient's bedside AND on the patient's hospital floor or unit, greater than 50% of which was counseling/coordinating care for right psoas abscess drain    Patient ID: April Shaffer, female   DOB: November 12, 1950, 73 y.o.   MRN: [id number]

## 2023-09-14 NOTE — Progress Notes (Signed)
 TOC saline flushes in a secure bag delivered to pt in room by this RN

## 2023-09-15 LAB — CULTURE, BLOOD (ROUTINE X 2): Culture: NO GROWTH

## 2023-09-16 ENCOUNTER — Other Ambulatory Visit: Payer: Self-pay | Admitting: Radiology

## 2023-09-16 LAB — CULTURE, BLOOD (ROUTINE X 2)
Culture: NO GROWTH
Culture: NO GROWTH
Special Requests: ADEQUATE
Special Requests: ADEQUATE

## 2023-09-17 ENCOUNTER — Other Ambulatory Visit: Payer: Self-pay | Admitting: Nurse Practitioner

## 2023-09-17 DIAGNOSIS — Z1231 Encounter for screening mammogram for malignant neoplasm of breast: Secondary | ICD-10-CM

## 2023-09-17 LAB — AEROBIC/ANAEROBIC CULTURE W GRAM STAIN (SURGICAL/DEEP WOUND)

## 2023-09-18 ENCOUNTER — Inpatient Hospital Stay (HOSPITAL_BASED_OUTPATIENT_CLINIC_OR_DEPARTMENT_OTHER): Payer: Medicare HMO | Admitting: Hematology

## 2023-09-18 ENCOUNTER — Inpatient Hospital Stay: Payer: Medicare HMO

## 2023-09-18 ENCOUNTER — Inpatient Hospital Stay: Payer: Medicare HMO | Attending: Nurse Practitioner

## 2023-09-18 ENCOUNTER — Telehealth: Payer: Self-pay

## 2023-09-18 VITALS — BP 96/50 | HR 80 | Temp 98.0°F | Resp 15 | Ht 63.0 in | Wt 115.6 lb

## 2023-09-18 DIAGNOSIS — E538 Deficiency of other specified B group vitamins: Secondary | ICD-10-CM | POA: Diagnosis not present

## 2023-09-18 DIAGNOSIS — D638 Anemia in other chronic diseases classified elsewhere: Secondary | ICD-10-CM | POA: Diagnosis not present

## 2023-09-18 DIAGNOSIS — E119 Type 2 diabetes mellitus without complications: Secondary | ICD-10-CM | POA: Diagnosis not present

## 2023-09-18 DIAGNOSIS — D500 Iron deficiency anemia secondary to blood loss (chronic): Secondary | ICD-10-CM

## 2023-09-18 DIAGNOSIS — D508 Other iron deficiency anemias: Secondary | ICD-10-CM | POA: Insufficient documentation

## 2023-09-18 LAB — CBC WITH DIFFERENTIAL (CANCER CENTER ONLY)
Abs Immature Granulocytes: 0.07 K/uL (ref 0.00–0.07)
Basophils Absolute: 0.1 K/uL (ref 0.0–0.1)
Basophils Relative: 1 %
Eosinophils Absolute: 0.1 K/uL (ref 0.0–0.5)
Eosinophils Relative: 1 %
HCT: 28.9 % — ABNORMAL LOW (ref 36.0–46.0)
Hemoglobin: 9.1 g/dL — ABNORMAL LOW (ref 12.0–15.0)
Immature Granulocytes: 1 %
Lymphocytes Relative: 14 %
Lymphs Abs: 1.7 K/uL (ref 0.7–4.0)
MCH: 25.3 pg — ABNORMAL LOW (ref 26.0–34.0)
MCHC: 31.5 g/dL (ref 30.0–36.0)
MCV: 80.5 fL (ref 80.0–100.0)
Monocytes Absolute: 0.5 K/uL (ref 0.1–1.0)
Monocytes Relative: 5 %
Neutro Abs: 9.3 K/uL — ABNORMAL HIGH (ref 1.7–7.7)
Neutrophils Relative %: 78 %
Platelet Count: 347 K/uL (ref 150–400)
RBC: 3.59 MIL/uL — ABNORMAL LOW (ref 3.87–5.11)
RDW: 17.1 % — ABNORMAL HIGH (ref 11.5–15.5)
WBC Count: 11.7 K/uL — ABNORMAL HIGH (ref 4.0–10.5)
nRBC: 0 % (ref 0.0–0.2)

## 2023-09-18 LAB — IRON AND IRON BINDING CAPACITY (CC-WL,HP ONLY)
Iron: 11 ug/dL — ABNORMAL LOW (ref 28–170)
Saturation Ratios: 6 % — ABNORMAL LOW (ref 10.4–31.8)
TIBC: 183 ug/dL — ABNORMAL LOW (ref 250–450)
UIBC: 172 ug/dL (ref 148–442)

## 2023-09-18 LAB — FERRITIN: Ferritin: 453 ng/mL — ABNORMAL HIGH (ref 11–307)

## 2023-09-18 LAB — VITAMIN B12: Vitamin B-12: 643 pg/mL (ref 180–914)

## 2023-09-18 MED ORDER — FLUCONAZOLE 150 MG PO TABS: 150.0000 mg | ORAL_TABLET | Freq: Once | ORAL | 1 refills | Status: AC

## 2023-09-18 MED ORDER — CYANOCOBALAMIN 1000 MCG/ML IJ SOLN
1000.0000 ug | Freq: Once | INTRAMUSCULAR | Status: AC
  Administered 2023-09-18: 1000 ug via INTRAMUSCULAR
  Filled 2023-09-18: qty 1

## 2023-09-18 NOTE — Telephone Encounter (Signed)
 Fluconazole  sent to pharmacy. Recommend eating a [place] yogurt to help reduce risk of yeast infection

## 2023-09-18 NOTE — Addendum Note (Signed)
 Addended by: Nazyia Gaugh D on: 09/18/2023 09:21 AM   Modules accepted: Orders

## 2023-09-18 NOTE — Telephone Encounter (Signed)
 Patient informed fluconazole  sent to the pharmacy and verbalized understanding.

## 2023-09-18 NOTE — Assessment & Plan Note (Signed)
-  previous lab showed low B12 and elevated MMA which confirms B12 deficiency.   -intrinsic factor from 08/31/21 was normal  -she began B12 injections on 08/31/21, weekly through 10/11/21 and now on monthly. Last dose in 12/2021 -She had a multiple hospital admission since January 2024, and missed her B12 injections, her anemia got worse, but improved again after resume B12 injections

## 2023-09-18 NOTE — Telephone Encounter (Signed)
 Patient complains of having a yeast infection with taking the antibiotics. Patient would like to know if you could send in something for this to [facility]. Please advise. April Shaffer April Shaffer, CMA

## 2023-09-18 NOTE — Assessment & Plan Note (Signed)
 -  iron panel 08/01/21 indicative of anemia of chronic disease rather than true iron deficiency  --iron on 12/06/21 was 19. She received 3 doses IV Venofer from 12/22/21.  She responded well, hemoglobin improved to 11.8 after IV iron.

## 2023-09-18 NOTE — Progress Notes (Signed)
 [facility]   Telephone:([phone] Fax:([phone]) [phone]   Clinic Follow up Note   Patient Care Team: Claudene Prentice DELENA Mickey., FNP as PCP - General (Family Medicine) Court Dorn PARAS, MD as PCP - Cardiology (Cardiology) Shari Sieving, MD as Consulting Physician (Orthopedic Surgery) Lanny Callander, MD as Consulting Physician (Hematology) Burton, Lacie K, NP as Nurse Practitioner (Nurse Practitioner) Claudene Prentice DELENA Mickey., FNP as Nurse Practitioner (Family Medicine) Joshua Alm Hamilton, MD as Consulting Physician (Neurosurgery)  Date of Service:  09/18/2023  CHIEF COMPLAINT: f/u of anemia and B12 deficiency  CURRENT THERAPY:  B12 injection monthly IV iron  if ferritin less than 50  Oncology History   B12 deficiency -previous lab showed low B12 and elevated MMA which confirms B12 deficiency.   -intrinsic factor from 08/31/21 was normal  -she began B12 injections on 08/31/21, weekly through 10/11/21 and now on monthly. Last dose in 12/2021 -She had a multiple hospital admission since January 2024, and missed her B12 injections, her anemia got worse, but improved again after resume B12 injections   Anemia of chronic disease -iron  panel 08/01/21 indicative of anemia of chronic disease rather than true iron  deficiency  --iron  on 12/06/21 was 19. She received 3 doses IV Venofer  from 12/22/21.  She responded well, hemoglobin improved to 11.8 after IV iron .  Assessment & Plan Anemia Anemia with recent hemoglobin level of 6.8 g/dL when she was admitted for sepsis, requiring blood transfusion. Current hemoglobin is 9.1 g/dL. Iron  levels are stable, no recent need for iron  supplementation. Anemia may worsen with infections, as noted post-surgery. - Monitor hemoglobin levels more frequently due to recent infection and antibiotic use. - Schedule follow-up in four months unless earlier intervention is needed due to significant hemoglobin drop.  Vitamin B12 deficiency Vitamin B12 deficiency managed  with monthly injections. Last injection was on June 25th, and she is due for the next one. - Administer B12 injection today. - Schedule monthly B12 injections.  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 She has folic acid  at home but has not been taking it regularly. - Instruct to take folic acid  1 mg once daily.  Recurrent infections Recurrent infections post-surgery, possibly related to diabetes. She is on antibiotics and will be on a lifelong prophylactic regimen to prevent future infections. - Continue antibiotics as prescribed. - Monitor for signs of infection.  Diabetes Diabetes with good blood sugar control. Recurrent infections post-surgery possibly related to diabetes.  Plan - Chart and lab reviewed, including her recent hospital admission - Will proceed to B12 injection today and continue monthly - No need IV iron  for now - Lab every 2 months and follow-up in 4 months    Discussed the use of AI scribe software for clinical note transcription with the patient, who gave verbal consent to proceed.  History of Present Illness April Shaffer is a 73 year old female with anemia and B12 deficiency who presents for follow-up.  She was hospitalized recently due to an infection after heel replacement surgery, during which her hemoglobin dropped to 6.8 g/dL, requiring a blood transfusion. Her hemoglobin has improved to 9.1 g/dL. Iron  levels are adequate, and B12 levels are normal, but folate is low.  She has recurrent infections following orthopedic surgeries, currently managed with antibiotics through a PICC line at home. The infection site is tender and occasionally sore. Her last B12 injection was on August 21, 2023.     All other systems were reviewed with the patient and are  negative.  MEDICAL HISTORY:  Past Medical History:  Diagnosis Date   Anemia    Arthritis    Blood transfusion without reported diagnosis    Carotid artery narrowing    Coronary  artery disease    Cardiac catheterization November 2013: 50% ostial LAD stenosis 50% mid stenosis. 30% disease in the left circumflex.   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   Sleep apnea       Had surgery to correct   Thyroid  nodule     SURGICAL HISTORY: Past Surgical History:  Procedure Laterality Date   ABDOMINAL AORTOGRAM W/LOWER EXTREMITY N/A 08/07/2021   Procedure: ABDOMINAL AORTOGRAM W/LOWER EXTREMITY;  Surgeon: Sheree Penne Bruckner, MD;  Location: [facility] INVASIVE CV LAB;  Service: Cardiovascular;  Laterality: N/A;   ABDOMINAL AORTOGRAM W/LOWER EXTREMITY Right 10/23/2021   Procedure: ABDOMINAL AORTOGRAM W/LOWER EXTREMITY;  Surgeon: Sheree Penne Bruckner, MD;  Location: [facility] INVASIVE CV LAB;  Service: Cardiovascular;  Laterality: Right;   ABDOMINAL HYSTERECTOMY     APPLICATION OF INTRAOPERATIVE CT SCAN N/A 11/30/2020   Procedure: APPLICATION OF INTRAOPERATIVE CT SCAN;  Surgeon: Joshua Alm RAMAN, MD;  Location: [facility] OR;  Service: Neurosurgery;  Laterality: N/A;   BACK SURGERY     CARDIAC CATHETERIZATION     CATARACT EXTRACTION, BILATERAL Bilateral 2021   Dr. Milissa   ENDARTERECTOMY  09/27/2011   Procedure: RIGT ENDARTERECTOMY CAROTID;  Surgeon: Lynwood JONETTA Collum, MD;  Location: [facility] OR;  Service: Vascular;  Laterality: Right;   EPIDURAL BLOCK INJECTION  02/2008   Drs. Clydell Mince   EYE SURGERY Bilateral 2021   cataract   gyn surgery  2004   total hysterectomy for mennorhagia,,salpingoophorectomy   INCISION AND DRAINAGE ABSCESS Left 05/08/2022   Procedure: ARTHROSCOPIC INCISION AND DRAINAGE ABSCESS, DISTAL CLAVICLE EXCISSION, SUBACROMIAL DECOMPRESSION, LOOSE BODY REMOVAL, EXTENSIVE DEBRIDEMENT;  Surgeon: Beverley Evalene JONETTA, MD;  Location: [facility] ORS;  Service: Orthopedics;  Laterality: Left;   LAMINECTOMY WITH POSTERIOR LATERAL  ARTHRODESIS LEVEL 2 N/A 11/30/2020   Procedure: LUMBAR FOUR-FIVE, LUMBAR FIVE-SACRAL ONE POSTERIOR LATERAL FUSION WITH REVISION OF LUMBAR ONE-FIVE HARDWARE AND EXTENSION TO SACRAL ONE AND SACRAL TWO;  Surgeon: Joshua Alm RAMAN, MD;  Location: [facility] OR;  Service: Neurosurgery;  Laterality: N/A;   LUMBAR WOUND DEBRIDEMENT N/A 05/25/2020   Procedure: LUMBAR WOUND IRRIGATION AND DEBRIDEMENT;  Surgeon: Joshua Alm RAMAN, MD;  Location: [facility] OR;  Service: Neurosurgery;  Laterality: N/A;   PERIPHERAL VASCULAR INTERVENTION  08/07/2021   Procedure: PERIPHERAL VASCULAR INTERVENTION;  Surgeon: Sheree Penne Bruckner, MD;  Location: [facility] INVASIVE CV LAB;  Service: Cardiovascular;;  Rt SFA   PERIPHERAL VASCULAR INTERVENTION Right 10/23/2021   Procedure: PERIPHERAL VASCULAR INTERVENTION;  Surgeon: Sheree Penne Bruckner, MD;  Location: [facility] INVASIVE CV LAB;  Service: Cardiovascular;  Laterality: Right;  SFA   POSTERIOR LUMBAR FUSION 4 LEVEL N/A 04/25/2020   Procedure: POSTERIOR LUMBAR INTERBODY FUSION LUMBAR ONE-TWO, LUMBAR TWO-THREE, LUMBAR THREE-FOUR,LUMBAR FOUR-FIVE.;  Surgeon: Joshua Alm RAMAN, MD;  Location: [facility] OR;  Service: Neurosurgery;  Laterality: N/A;  posterior   SHOULDER ARTHROSCOPY Left 05/08/2022   Procedure: ARTHROSCOPY SHOULDER;  Surgeon: Beverley Evalene JONETTA, MD;  Location: [facility] ORS;  Service: Orthopedics;  Laterality: Left;   TONSILLECTOMY     TOTAL ABDOMINAL HYSTERECTOMY W/ BILATERAL SALPINGOOPHORECTOMY Bilateral 2000   TOTAL HIP ARTHROPLASTY Right 08/22/2018   Procedure: TOTAL HIP  ARTHROPLASTY;  Surgeon: Shari Sieving, MD;  Location: [facility] ORS;  Service: Orthopedics;  Laterality: Right;   TOTAL HIP ARTHROPLASTY Left 07/16/2023   Procedure: ARTHROPLASTY, HIP, TOTAL, ANTERIOR APPROACH;  Surgeon: Beverley Evalene BIRCH, MD;  Location: [facility] ORS;  Service: Orthopedics;  Laterality: Left;   TRANSCAROTID ARTERY REVASCULARIZATION  Left 08/07/2022   Procedure: Transcarotid Artery Revascularization;  Surgeon: Sheree Penne Bruckner, MD;  Location: [facility] OR;  Service: Vascular;  Laterality: Left;   ULTRASOUND GUIDANCE FOR VASCULAR ACCESS Left 08/07/2022   Procedure: ULTRASOUND GUIDANCE FOR VASCULAR ACCESS;  Surgeon: Sheree Penne Bruckner, MD;  Location: [facility] OR;  Service: Vascular;  Laterality: Left;   VARICOSE VEIN SURGERY  2008   stripping    I have reviewed the social history and family history with the patient and they are unchanged from previous note.  ALLERGIES:  is allergic to zestoretic  [lisinopril -hydrochlorothiazide ], codeine, oxycodone , ultram  [tramadol ], and zocor  [simvastatin ].  MEDICATIONS:  Current Outpatient Medications  Medication Sig Dispense Refill   acetaminophen  (TYLENOL ) 500 MG tablet Take 500 mg by mouth every 6 (six) hours as needed for mild pain (pain score 1-3), moderate pain (pain score 4-6) or headache.     amLODipine  (NORVASC ) 10 MG tablet Take 10 mg by mouth in the morning.     aspirin  EC 81 MG tablet Take 1 tablet (81 mg total) by mouth 2 (two) times daily. To prevent blood clots for 30 days after surgery. 60 tablet 0   carvedilol  (COREG  CR) 10 MG 24 hr capsule Take 10 mg by mouth in the morning.     clopidogrel  (PLAVIX ) 75 MG tablet Take 75 mg by mouth in the morning.     cyanocobalamin  (VITAMIN B12) 1000 MCG/ML injection Inject 1,000 mcg into the muscle every 30 (thirty) days.     daptomycin  (CUBICIN ) IVPB Inject 450 mg into the vein daily. Indication:  Psoas abscess First Dose: Yes Last Day of Therapy:  10/24/23 Labs - Once weekly:  CBC/D, BMP, and CPK Labs - Once weekly: ESR and CRP Method of administration: IV Push Method of administration may be changed at the discretion of home infusion pharmacist based upon assessment of the patient and/or caregiver's ability to self-administer the medication ordered. 41 Units 0   ezetimibe  (ZETIA ) 10 MG tablet TAKE 1 TABLET EVERY DAY (NEED MD APPOINTMENT) (Patient taking differently: Take 10 mg by mouth daily.) 90 tablet 0    fluconazole  (DIFLUCAN ) 150 MG tablet Take 1 tablet (150 mg total) by mouth once for 1 dose. And repeat in 72 hours if needed 2 tablet 1   gabapentin  (NEURONTIN ) 300 MG capsule Take 300 mg by mouth in the morning, at noon, and at bedtime.     glucose blood test strip Check sugars twice daily 100 each 11   hydrochlorothiazide  (HYDRODIURIL ) 12.5 MG tablet Take 12.5 mg by mouth in the morning.     metFORMIN  (GLUCOPHAGE -XR) 750 MG 24 hr tablet Take 750 mg by mouth in the morning.     ondansetron  (ZOFRAN ) 4 MG tablet Take 4 mg by mouth every 8 (eight) hours as needed for nausea or vomiting.     ondansetron  (ZOFRAN -ODT) 4 MG disintegrating tablet Take 1 tablet (4 mg total) by mouth every 8 (eight) hours as needed for nausea or vomiting. (Patient taking differently: Take 4 mg by mouth every 8 (eight) hours as needed for nausea or vomiting (DISSOLVE ORALLY).) 15 tablet 0   oxyCODONE -acetaminophen  (PERCOCET) 10-325 MG tablet Take 1 tablet by mouth every 4 (four) hours  as needed for pain. (Patient taking differently: Take 1 tablet by mouth See admin instructions. Take 1 tablet by mouth every 6-8 hours as needed for pain) 40 tablet 0   sennosides-docusate sodium  (SENOKOT-S) 8.6-50 MG tablet Take 2 tablets by mouth daily as needed for constipation (while taking narcotics). 30 tablet 1   sodium chloride  flush 0.9 % SOLN injection Flush with 5-10mL of saline daily to prevent clogging 200 mL 0   No current facility-administered medications for this visit.   Facility-Administered Medications Ordered in Other Visits  Medication Dose Route Frequency Provider Last Rate Last Admin   cyanocobalamin  (VITAMIN B12) injection 1,000 mcg  1,000 mcg Intramuscular Once Burton, Lacie K, NP        PHYSICAL EXAMINATION: ECOG PERFORMANCE STATUS: 2 - Symptomatic, <50% confined to bed  Vitals:   09/18/23 1343  BP: (!) 96/50  Pulse: 80  Resp: 15  Temp: 98 F (36.7 C)  SpO2: 99%   Wt Readings from Last 3 Encounters:   09/18/23 115 lb 9.6 oz (52.4 kg)  09/14/23 106 lb 0.7 oz (48.1 kg)  07/16/23 128 lb (58.1 kg)     GENERAL:alert, no distress and comfortable SKIN: skin color, texture, turgor are normal, no rashes or significant lesions, (+) PICC line in right upper arm is clean without any discharge.  She has a drain in the right hip area with mild discharge. EYES: normal, Conjunctiva are pink and non-injected, sclera clear NECK: supple, thyroid  normal size, non-tender, without nodularity LYMPH:  no palpable lymphadenopathy in the cervical, axillary  LUNGS: clear to auscultation and percussion with normal breathing effort HEART: regular rate & rhythm and no murmurs and no lower extremity edema ABDOMEN:abdomen soft, non-tender and normal bowel sounds Musculoskeletal:no cyanosis of digits and no clubbing  NEURO: alert & oriented x 3 with fluent speech, no focal motor/sensory deficits  Physical Exam   LABORATORY DATA:  I have reviewed the data as listed    Latest Ref Rng & Units 09/18/2023    1:24 PM 09/13/2023    3:43 AM 09/12/2023    5:20 PM  CBC  WBC 4.0 - 10.5 K/uL 11.7  14.2    Hemoglobin 12.0 - 15.0 g/dL 9.1  8.7  8.8   Hematocrit 36.0 - 46.0 % 28.9  28.0  28.6   Platelets 150 - 400 K/uL 347  420          Latest Ref Rng & Units 09/12/2023    3:52 AM 09/11/2023    4:05 PM 09/10/2023   12:45 PM  CMP  Glucose 70 - 99 mg/dL 92  868  848   BUN 8 - 23 mg/dL 16  16  12    Creatinine 0.44 - 1.00 mg/dL 9.01  9.02  8.95   Sodium 135 - 145 mmol/L 136  135  134   Potassium 3.5 - 5.1 mmol/L 3.5  3.3  3.1   Chloride 98 - 111 mmol/L 105  103  102   CO2 22 - 32 mmol/L 23  23  22    Calcium  8.9 - 10.3 mg/dL 8.2  8.7  9.0   Total Protein 6.5 - 8.1 g/dL  9.2  9.6   Total Bilirubin 0.0 - 1.2 mg/dL  0.5  0.4   Alkaline Phos 38 - 126 U/L  93  93   AST 15 - 41 U/L  11  11   ALT 0 - 44 U/L  6  8       RADIOGRAPHIC STUDIES: I  have personally reviewed the radiological images as listed and agreed with  the findings in the report. No results found.    No orders of the defined types were placed in this encounter.  All questions were answered. The patient knows to call the clinic with any problems, questions or concerns. No barriers to learning was detected. The total time spent in the appointment was 25 minutes, including review of chart and various tests results, discussions about plan of care and coordination of care plan     Onita Mattock, MD 09/18/2023

## 2023-09-19 ENCOUNTER — Ambulatory Visit: Payer: Self-pay | Admitting: Nurse Practitioner

## 2023-09-20 ENCOUNTER — Other Ambulatory Visit: Payer: Self-pay | Admitting: Internal Medicine

## 2023-09-20 DIAGNOSIS — M6289 Other specified disorders of muscle: Secondary | ICD-10-CM

## 2023-09-25 ENCOUNTER — Other Ambulatory Visit: Payer: Self-pay | Admitting: Interventional Radiology

## 2023-09-25 ENCOUNTER — Ambulatory Visit
Admission: RE | Admit: 2023-09-25 | Discharge: 2023-09-25 | Disposition: A | Discharge status: Home / Self Care | Source: Ambulatory Visit | Attending: Radiology

## 2023-09-25 ENCOUNTER — Ambulatory Visit
Admission: RE | Admit: 2023-09-25 | Discharge: 2023-09-25 | Disposition: A | Discharge status: Home / Self Care | Source: Ambulatory Visit | Attending: Internal Medicine | Admitting: Internal Medicine

## 2023-09-25 ENCOUNTER — Ambulatory Visit
Admission: RE | Admit: 2023-09-25 | Discharge: 2023-09-25 | Disposition: A | Discharge status: Home / Self Care | Source: Ambulatory Visit | Attending: Internal Medicine

## 2023-09-25 DIAGNOSIS — M6289 Other specified disorders of muscle: Secondary | ICD-10-CM

## 2023-09-25 DIAGNOSIS — T8149XA Infection following a procedure, other surgical site, initial encounter: Secondary | ICD-10-CM

## 2023-09-25 DIAGNOSIS — K6812 Psoas muscle abscess: Secondary | ICD-10-CM

## 2023-09-25 MED ORDER — IOPAMIDOL (ISOVUE-300) INJECTION 61%
100.0000 mL | Freq: Once | INTRAVENOUS | Status: AC | PRN
  Administered 2023-09-25: 100 mL via INTRAVENOUS

## 2023-09-25 NOTE — Progress Notes (Signed)
 Chief Complaint: Patient was seen in consultation today for right psoas fluid collection.  Referring Physician(s): Dr. Caron Salt, DO  Supervising Physician: Johann Sieving  History of Present Illness: April Shaffer is a 73 y.o. female smoking, arthritis, CAD on ASA/Plavix , T2DM, PAD w/ right SFA stent, total left hip arthroplasty, and previous lumbar spine surgery in 2022 who presented to the [facility] ED on 7/15 with weakness, nausea, and a bump on her back in the area of her prior spinal surgery. Neurosurgery attempted aspiration of the area without success. MRI was ordered on 7/11 which showed post-operative changes, with a fluid collection in the right psoas muscle. IR was consulted and a drain was placed on 7/17 by Dr. Philip. Cultures were positive for S. Aureus. Patient was discharged home on 7/19, with the drain in place.  She returns to [facility] today for drain evaluation and management.    Patient reports her drain has continued with approximately 25 mL output of tan serous fluid daily.  She is flushing once daily, and remains on prescribed outpatient antibiotic therapy. She has been feeling well, with some days with fatigue, per baseline. She has good appetite and oral intake, and regular bowel movements. She is scheduled for follow-up with Infectious Diseases next week, and with her PCP soon as well.   Past Medical History:  Diagnosis Date   Anemia    Arthritis    Blood transfusion without reported diagnosis    Carotid artery narrowing    Coronary artery disease    Cardiac catheterization November 2013: 50% ostial LAD stenosis 50% mid stenosis. 30% disease in the left circumflex.   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   Sleep apnea       Had surgery to correct   Thyroid  nodule     Past Surgical History:  Procedure  Laterality Date   ABDOMINAL AORTOGRAM W/LOWER EXTREMITY N/A 08/07/2021   Procedure: ABDOMINAL AORTOGRAM W/LOWER EXTREMITY;  Surgeon: Sheree Penne Bruckner, MD;  Location: [facility] INVASIVE CV LAB;  Service: Cardiovascular;  Laterality: N/A;   ABDOMINAL AORTOGRAM W/LOWER EXTREMITY Right 10/23/2021   Procedure: ABDOMINAL AORTOGRAM W/LOWER EXTREMITY;  Surgeon: Sheree Penne Bruckner, MD;  Location: [facility] INVASIVE CV LAB;  Service: Cardiovascular;  Laterality: Right;   ABDOMINAL HYSTERECTOMY     APPLICATION OF INTRAOPERATIVE CT SCAN N/A 11/30/2020   Procedure: APPLICATION OF INTRAOPERATIVE CT SCAN;  Surgeon: Joshua Alm RAMAN, MD;  Location: [facility] OR;  Service: Neurosurgery;  Laterality: N/A;   BACK SURGERY     CARDIAC CATHETERIZATION     CATARACT EXTRACTION, BILATERAL Bilateral 2021   Dr. Milissa   ENDARTERECTOMY  09/27/2011   Procedure: RIGT ENDARTERECTOMY CAROTID;  Surgeon: Lynwood JONETTA Collum, MD;  Location: [facility] OR;  Service: Vascular;  Laterality: Right;   EPIDURAL BLOCK INJECTION  02/2008   Drs. Clydell Mince   EYE SURGERY Bilateral 2021   cataract   gyn surgery  2004   total hysterectomy for mennorhagia,,salpingoophorectomy   INCISION AND DRAINAGE ABSCESS Left 05/08/2022   Procedure: ARTHROSCOPIC INCISION AND DRAINAGE ABSCESS, DISTAL CLAVICLE EXCISSION, SUBACROMIAL DECOMPRESSION, LOOSE BODY REMOVAL, EXTENSIVE DEBRIDEMENT;  Surgeon: Beverley Evalene JONETTA, MD;  Location: [facility] ORS;  Service: Orthopedics;  Laterality: Left;   LAMINECTOMY WITH POSTERIOR LATERAL ARTHRODESIS LEVEL 2 N/A 11/30/2020   Procedure: LUMBAR FOUR-FIVE, LUMBAR FIVE-SACRAL ONE POSTERIOR LATERAL  FUSION WITH REVISION OF LUMBAR ONE-FIVE HARDWARE AND EXTENSION TO SACRAL ONE AND SACRAL TWO;  Surgeon: Joshua Alm RAMAN, MD;  Location: [facility] OR;  Service: Neurosurgery;  Laterality: N/A;   LUMBAR WOUND DEBRIDEMENT N/A 05/25/2020   Procedure: LUMBAR WOUND IRRIGATION AND DEBRIDEMENT;  Surgeon: Joshua Alm RAMAN, MD;  Location: [facility] OR;  Service:  Neurosurgery;  Laterality: N/A;   PERIPHERAL VASCULAR INTERVENTION  08/07/2021   Procedure: PERIPHERAL VASCULAR INTERVENTION;  Surgeon: Sheree Penne Bruckner, MD;  Location: [facility] INVASIVE CV LAB;  Service: Cardiovascular;;  Rt SFA   PERIPHERAL VASCULAR INTERVENTION Right 10/23/2021   Procedure: PERIPHERAL VASCULAR INTERVENTION;  Surgeon: Sheree Penne Bruckner, MD;  Location: [facility] INVASIVE CV LAB;  Service: Cardiovascular;  Laterality: Right;  SFA   POSTERIOR LUMBAR FUSION 4 LEVEL N/A 04/25/2020   Procedure: POSTERIOR LUMBAR INTERBODY FUSION LUMBAR ONE-TWO, LUMBAR TWO-THREE, LUMBAR THREE-FOUR,LUMBAR FOUR-FIVE.;  Surgeon: Joshua Alm RAMAN, MD;  Location: [facility] OR;  Service: Neurosurgery;  Laterality: N/A;  posterior   SHOULDER ARTHROSCOPY Left 05/08/2022   Procedure: ARTHROSCOPY SHOULDER;  Surgeon: Beverley Evalene BIRCH, MD;  Location: [facility] ORS;  Service: Orthopedics;  Laterality: Left;   TONSILLECTOMY     TOTAL ABDOMINAL HYSTERECTOMY W/ BILATERAL SALPINGOOPHORECTOMY Bilateral 2000   TOTAL HIP ARTHROPLASTY Right 08/22/2018   Procedure: TOTAL HIP ARTHROPLASTY;  Surgeon: Shari Sieving, MD;  Location: [facility] ORS;  Service: Orthopedics;  Laterality: Right;   TOTAL HIP ARTHROPLASTY Left 07/16/2023   Procedure: ARTHROPLASTY, HIP, TOTAL, ANTERIOR APPROACH;  Surgeon: Beverley Evalene BIRCH, MD;  Location: [facility] ORS;  Service: Orthopedics;  Laterality: Left;   TRANSCAROTID ARTERY REVASCULARIZATION  Left 08/07/2022   Procedure: Transcarotid Artery Revascularization;  Surgeon: Sheree Penne Bruckner, MD;  Location: [facility] OR;  Service: Vascular;  Laterality: Left;   ULTRASOUND GUIDANCE FOR VASCULAR ACCESS Left 08/07/2022   Procedure: ULTRASOUND GUIDANCE FOR VASCULAR ACCESS;  Surgeon: Sheree Penne Bruckner, MD;  Location: [facility] OR;  Service: Vascular;  Laterality: Left;   VARICOSE VEIN SURGERY  2008   stripping    Allergies: Zestoretic  [lisinopril -hydrochlorothiazide ], Codeine, Oxycodone , Ultram  [tramadol ], and Zocor   [simvastatin ]  Medications: Prior to Admission medications   Medication Sig Start Date End Date Taking? Authorizing Provider  acetaminophen  (TYLENOL ) 500 MG tablet Take 500 mg by mouth every 6 (six) hours as needed for mild pain (pain score 1-3), moderate pain (pain score 4-6) or headache.    [provider]  amLODipine  (NORVASC ) 10 MG tablet Take 10 mg by mouth in the morning. 06/18/23   [provider]  aspirin  EC 81 MG tablet Take 1 tablet (81 mg total) by mouth 2 (two) times daily. To prevent blood clots for 30 days after surgery. 07/17/23   Gawne, Meghan M, PA-C  carvedilol  (COREG  CR) 10 MG 24 hr capsule Take 10 mg by mouth in the morning. 05/11/23   [provider]  clopidogrel  (PLAVIX ) 75 MG tablet Take 75 mg by mouth in the morning.    [provider]  cyanocobalamin  (VITAMIN B12) 1000 MCG/ML injection Inject 1,000 mcg into the muscle every 30 (thirty) days.    [provider]  daptomycin  (CUBICIN ) IVPB Inject 450 mg into the vein daily. Indication:  Psoas abscess First Dose: Yes Last Day of Therapy:  10/24/23 Labs - Once weekly:  CBC/D, BMP, and CPK Labs - Once weekly: ESR and CRP Method of administration: IV Push Method of administration may be changed at the discretion of home infusion pharmacist based upon assessment of the patient and/or caregiver's ability to self-administer the medication  ordered. 09/13/23 10/24/23  Vu, Constance DASEN, MD  ezetimibe  (ZETIA ) 10 MG tablet TAKE 1 TABLET EVERY DAY (NEED MD APPOINTMENT) Patient taking differently: Take 10 mg by mouth daily. 09/27/20   Court Dorn PARAS, MD  gabapentin  (NEURONTIN ) 300 MG capsule Take 300 mg by mouth in the morning, at noon, and at bedtime. 06/09/21   [provider]  glucose blood test strip Check sugars twice daily 01/31/16   Adella Norris, MD  hydrochlorothiazide  (HYDRODIURIL ) 12.5 MG tablet Take 12.5 mg by mouth in the morning.    [provider]  metFORMIN   (GLUCOPHAGE -XR) 750 MG 24 hr tablet Take 750 mg by mouth in the morning. 06/07/23   [provider]  ondansetron  (ZOFRAN ) 4 MG tablet Take 4 mg by mouth every 8 (eight) hours as needed for nausea or vomiting. 05/20/23   [provider]  ondansetron  (ZOFRAN -ODT) 4 MG disintegrating tablet Take 1 tablet (4 mg total) by mouth every 8 (eight) hours as needed for nausea or vomiting. Patient taking differently: Take 4 mg by mouth every 8 (eight) hours as needed for nausea or vomiting (DISSOLVE ORALLY). 07/17/23   Gawne, Meghan M, PA-C  oxyCODONE -acetaminophen  (PERCOCET) 10-325 MG tablet Take 1 tablet by mouth every 4 (four) hours as needed for pain. Patient taking differently: Take 1 tablet by mouth See admin instructions. Take 1 tablet by mouth every 6-8 hours as needed for pain 12/03/20   Joshua Alm Hamilton, MD  sennosides-docusate sodium  (SENOKOT-S) 8.6-50 MG tablet Take 2 tablets by mouth daily as needed for constipation (while taking narcotics). 07/17/23   Gawne, Meghan M, PA-C  sodium chloride  flush 0.9 % SOLN injection Flush with 5-10mL of saline daily to prevent clogging 09/13/23   McInnis, Glennon HERO, PA-C     Family History  Problem Relation Age of Onset   Hypertension Sister    Hypothyroidism Sister    Cancer Maternal Grandmother        lung   Hypertension Son    Hypertension Sister    Diabetes Brother        lost toe in 2018 with new diagnosis of DM   Hypertension Son     Social History   Socioeconomic History   Marital status: Significant Other    Spouse name: Not on file   Number of children: 2   Years of education: 12   Highest education level: Not on file  Occupational History   Occupation: housekeeping-retired; 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    Comment: [employer]-retired 07/23/2014  Tobacco Use   Smoking status: Every Day    Current packs/day: 0.25    Average packs/day: 0.3 packs/day for 55.3 years (13.8 ttl pk-yrs)    Types: Cigarettes     Start date: 05/31/1968    Passive exposure: Never   Smokeless tobacco: Never   Tobacco comments:    Smoked x50 years, 1 pack would last 3 days. Currently smoking 4-5 cigarettes per day  Vaping Use   Vaping status: Never Used  Substance and Sexual Activity   Alcohol use: Not Currently    Alcohol/week: 14.0 standard drinks of alcohol    Types: 14 Standard drinks or equivalent per week    Comment: 1-2 drinks every other day   Drug use: No   Sexual activity: Not on file  Other Topics Concern   Not on file  Social History Narrative   Lives with long term boyfriend (30 years together)   Both sons live in [place]  Social Drivers of Corporate investment banker Strain: Low Risk  (08/06/2018)   Overall Financial Resource Strain (CARDIA)    Difficulty of Paying Living Expenses: Not hard at all  Food Insecurity: No Food Insecurity (09/18/2023)   Hunger Vital Sign    Worried About Running Out of Food in the Last Year: Never true    Ran Out of Food in the Last Year: Never true  Transportation Needs: No Transportation Needs (09/18/2023)   PRAPARE - Administrator, Civil Service (Medical): No    Lack of Transportation (Non-Medical): No  Physical Activity: Inactive (06/17/2017)   Exercise Vital Sign    Days of Exercise per Week: 0 days    Minutes of Exercise per Session: 0 min  Stress: No Stress Concern Present (06/17/2017)   [employer] of Occupational Health - Occupational Stress Questionnaire    Feeling of Stress : Not at all  Social Connections: Unknown (09/11/2023)   Social Connection and Isolation Panel    Frequency of Communication with Friends and Family: Not on file    Frequency of Social Gatherings with Friends and Family: Not on file    Attends Religious Services: More than 4 times per year    Active Member of [employer] or Organizations: Not on file    Attends Engineer, structural: More than 4 times per year    Marital Status: Living with partner     Review of Systems: A 12 point ROS discussed and pertinent positives are indicated in the HPI above.  All other systems are negative.  Review of Systems  Constitutional:  Positive for fatigue. Negative for appetite change, chills and fever.  Respiratory:  Negative for cough and shortness of breath.   Cardiovascular:  Negative for chest pain.  Gastrointestinal:  Positive for abdominal pain. Negative for nausea and vomiting.  Psychiatric/Behavioral:  Negative for behavioral problems and confusion.     Physical Exam Abdominal:     General: Abdomen is flat.     Comments: RLQ/ flank drain appropriately dressed. Dressing is clean, dry, intact. Drain incision site minimally tender, without evidence of infection. Retaining suture and Stat Lock in place.  Minimal serous output in collection bag.     Imaging: US  EKG SITE RITE Result Date: 09/13/2023 If Site Rite image not attached, placement could not be confirmed due to current cardiac rhythm.  CT GUIDED PERITONEAL/RETROPERITONEAL FLUID DRAIN BY PERC CATH Result Date: 09/12/2023 INDICATION: 73 year old with a right psoas fluid collection. Patient presents for CT-guided aspiration or drainage EXAM: CT-GUIDED PLACEMENT OF DRAINAGE CATHETER IN RIGHT PSOAS ABSCESS TECHNIQUE: Multidetector CT imaging of the lower abdomen and pelvis was performed following the standard protocol with/without IV contrast. RADIATION DOSE REDUCTION: This exam was performed according to the departmental dose-optimization program which includes automated exposure control, adjustment of the mA and/or kV according to patient size and/or use of iterative reconstruction technique. MEDICATIONS: Moderate sedation ANESTHESIA/SEDATION: Moderate (conscious) sedation was employed during this procedure. A total of Versed  3 mg and Fentanyl  100 mcg was administered intravenously by the radiology nurse. Total intra-service moderate Sedation Time: 19 minutes. The patient's level of  consciousness and vital signs were monitored continuously by radiology nursing throughout the procedure under my direct supervision. COMPLICATIONS: None immediate. PROCEDURE: Informed written consent was obtained from the patient after a thorough discussion of the procedural risks, benefits and alternatives. All questions were addressed. Maximal Sterile Barrier Technique was utilized including caps, mask, sterile gowns, sterile gloves, sterile drape, hand hygiene and  skin antiseptic. A timeout was performed prior to the initiation of the procedure. Patient was placed supine on the CT scanner with the right side rolled up. CT images through the lower abdomen and pelvis were obtained. The large right psoas fluid collection was identified and targeted. The right side of the lower abdomen was prepped with chlorhexidine  and sterile field was created. Skin was anesthetized using 1% lidocaine . Small incision was made. Using CT guidance, an 18 gauge trocar needle was directed into the fluid collection. Opaque yellow fluid was aspirated. Superstiff Amplatz wire was advanced into the collection and the tract was dilated to accommodate a 10 [place] multipurpose drain. 75 mL of blood tinged yellow purulent fluid was aspirated. Follow up CT images were obtained. Drain was sutured to the skin and attached to a suction bag. FINDINGS: Large fluid collection involving the right psoas. 10 French drain was placed in the collection and 75 mL of blood tinged purulent fluid was removed. The collection was decompressed at the end of the procedure. IMPRESSION: CT-guided placement of a drainage catheter in the right psoas abscess. Electronically Signed   By: Juliene Balder M.D.   On: 09/12/2023 14:34   CT ABDOMEN PELVIS W CONTRAST Result Date: 09/11/2023 CLINICAL DATA:  Psoas abscess on MRI EXAM: CT ABDOMEN AND PELVIS WITH CONTRAST TECHNIQUE: Multidetector CT imaging of the abdomen and pelvis was performed using the standard protocol  following bolus administration of intravenous contrast. RADIATION DOSE REDUCTION: This exam was performed according to the departmental dose-optimization program which includes automated exposure control, adjustment of the mA and/or kV according to patient size and/or use of iterative reconstruction technique. CONTRAST:  OMNIPAQUE  IOHEXOL  300 MG/ML  SOLN COMPARISON:  MRI lumbar spine 09/06/2023, prior lumbar MRI 11/30/2022, bone scan 04/30/2022, CT 04/23/2022, 07/18/2021 FINDINGS: Lower chest: Lung bases demonstrate no acute airspace disease. Coronary vascular calcification. Hepatobiliary: No focal liver abnormality is seen. No gallstones, gallbladder wall thickening, or biliary dilatation. Pancreas: Unremarkable. No pancreatic ductal dilatation or surrounding inflammatory changes. Spleen: Normal in size without focal abnormality. Adrenals/Urinary Tract: Adrenal glands are normal. Kidneys show no hydronephrosis. Bilateral renal cysts for which no imaging follow-up is recommended. Bladder is unremarkable global allowing for artifact. Stomach/Bowel: The stomach is nonenlarged. There is no dilated small bowel. No acute bowel wall thickening. Vascular/Lymphatic: Advanced aortic atherosclerosis. No aneurysm. No suspicious lymph nodes. Reproductive: Hysterectomy.  No adnexal mass. Other: Negative for pelvic effusion or free air. Musculoskeletal: Extensive posterior spinal fusion hardware, extends from L1 through the sacrum with bilateral fixating screws across the SI joints. Residual listhesis at L3-L4 and L4-L5. Prominent lucency surrounding the transpedicular screws bilaterally at L1 and L2. Prominent lucency around bilateral S1 fixating screws as well as right greater than left SI joint fixating screws. As seen on recent lumbar spine MRI, large mildly rim enhancing fluid collection in the right psoas muscle, this measures approximately 5.2 x 4.1 by 8 cm. Additional rim enhancing fluid collection anterior to L4 on  L5, partially obscured by artifact from hardware, this measures approximately 3.2 by 2 cm on axial series 12, image 45, by 4.3 cm craniocaudal on sagittal series 10, image 91. Prevertebral rim enhancing collection exhibits mass effect on the common iliac veins. There is mild stranding within the retroperitoneum at this level. Low-density appearance of left psoas muscle, difficult to exclude edema. Compared 2 previous exams, progressive widening of the bilateral SI joints with irregular margin. Progressive heterogeneous sclerosis in the sacrum and iliac bones abutting the SI joints.  IMPRESSION: 1. Extensive posterior spinal fusion hardware extending from L1 through the sacrum with bilateral fixating screws across the SI joints. Again visualized is prominent lucency surrounding the transpedicular screws bilaterally at L1 and L2, bilateral S1 fixating screws as well as right greater than left SI joint fixating screws, findings consistent with hardware loosening and/or infection. 2. As seen on recent lumbar spine MRI, large mildly rim enhancing fluid collection in the right psoas muscle measuring up to 8 cm, suspicious for an abscess in the appropriate clinical setting. Additional rim enhancing fluid collection anterior to L4 and L5, partially obscured by artifact from hardware, measuring up to 4.3 cm, also suspect for abscess. Low-density appearance of the left psoas muscle, potentially due to edema. 3. 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 4. Aortic atherosclerosis. Aortic Atherosclerosis (ICD10-I70.0). Electronically Signed   By: Luke Bun M.D.   On: 09/11/2023 19:08   MR Lumbar Spine W Wo Contrast Result Date: 09/06/2023 CLINICAL DATA:  Incision wound in back EXAM: MRI LUMBAR  SPINE WITHOUT AND WITH CONTRAST TECHNIQUE: Multiplanar and multiecho pulse sequences of the lumbar spine were obtained without and with intravenous contrast. CONTRAST:  5  mL of vueway  IV COMPARISON:  MRI of the lumbar spine dated 11/30/2022 FINDINGS: Segmentation: Standard. Alignment:  Physiologic lumbar alignment is maintained. Vertebrae: Operative changes of posterior decompression and fusion from L1 through the sacrum. There is interbody fusion at L1-L2, L2-L3, L3-L4, and L4-L5. Abutment metal hardware artifact obscures portions of the canal and posterior soft tissues. No compression fractures. Conus medullaris and cauda equina: The conus medullaris terminates at the level of L1-L2. The distal spinal cord signal intensity is normal. Paraspinal and other soft tissues: Renal cysts bilaterally. Fluid collection in the right psoas muscle measuring up to 9-10 cm in craniocaudal dimension. There is also a prevertebral fluid collection at L4-L5. Findings may represent postoperative hematomas or seromas. There is also abundant edema in the psoas musculature bilaterally. Additional fluid collection in the superficial posterior soft tissue at the level of L1 measuring up to 3.5 cm. The visualized aorta is normal. Disc levels: L1-L2: Interbody fusion. Facets are obscured. Mild bilateral neuroforaminal stenosis. No spinal canal stenosis. L2-L3: Interbody fusion. Facets are obscured. Moderate bilateral neuroforaminal stenosis. No spinal canal stenosis. L3-L4: Interbody fusion. Facets are obscured. Moderate bilateral neuroforaminal stenosis. Moderate spinal canal stenosis. L4-L5: Interbody fusion. Facets and foramina are obscured. At least moderate canal stenoses, evaluation degraded by metal hardware artifact. L5-S1: Mild disc bulge. Facets and foramina are obscured. At least mild canal stenosis. IMPRESSION: 1. Operative changes of posterior decompression and fusion from L1 through the sacrum. Interbody fusion at L1-L2,  L2-L3, L3-L4, and L4-L5. Abutment metal hardware artifact obscures portions of the canal and posterior soft tissues. 2. Fluid collection in the right psoas muscle measuring up to 9-10 cm in craniocaudal dimension. There is also a prevertebral fluid collection at L4-L5. Findings may represent postoperative hematomas or seromas. Additional fluid collection in the superficial posterior soft tissue at the level of L1, may also represent a postoperative seroma or hematoma. Superimposed infections in the collections is not excluded on imaging. 3. 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 Electronically Signed   By: Clem Savory M.D.   On: 09/06/2023 14:39  Labs:  CBC: Recent Labs    09/11/23 1605 09/12/23 0536 09/12/23 1720 09/13/23 0343 09/18/23 1324  WBC 12.0* 12.3*  --  14.2* 11.7*  HGB 8.1* 6.8* 8.8* 8.7* 9.1*  HCT 27.1* 22.5* 28.6* 28.0* 28.9*  PLT 500* 414*  --  420* 347    COAGS: Recent Labs    09/12/23 0944  INR 1.2    BMP: Recent Labs    07/08/23 1347 09/10/23 1245 09/11/23 1605 09/12/23 0352  NA 136 134* 135 136  K 3.0* 3.1* 3.3* 3.5  CL 100 102 103 105  CO2 23 22 23 23   GLUCOSE 106* 151* 131* 92  BUN 16 12 16 16   CALCIUM  8.5* 9.0 8.7* 8.2*  CREATININE 0.91 1.04* 0.97 0.98  GFRNONAA >60 57* >60 >60    LIVER FUNCTION TESTS: Recent Labs    09/10/23 1245 09/11/23 1605  BILITOT 0.4 0.5  AST 11* 11*  ALT 8 6  ALKPHOS 93 93  PROT 9.6* 9.2*  ALBUMIN  2.2* 2.4*    TUMOR MARKERS: No results for input(s): AFPTM, CEA, CA199, CHROMGRNA in the last 8760 hours.  Assessment:  Right psoas fluid collection s/p aspiration and drainage on 09/12/23 Patients presents for follow-up today of her right flank drain.  She has been taking her antibiotics as prescribed.  She has been flushing her drain once daily.  Daily output from drain remains elevated at 25 mL. Patient reports she is overall feeling  well.  CT Abdomen Pelvis obtained and reviewed by Dr. Johann, who recommended drain remain in place and follow-up in 2-3 weeks with repeat CT imaging and possible drain injection. There is a concern for lucency around the sacroiliac fixation screws bilaterally, possibly representing infection around hardware. Final Read on CT is pending at this time.  Advised patient to maintain follow-up appointments with PCP, Infectious diseases, and Neurology service. Patient given return to clinic/ present to ED precautions. Patient acknowledged given instructions and she verbalized understanding.    Electronically Signed: Carlin LABOR Miangel Flom PA-C 09/25/2023, 4:21 PM   Please refer to Dr. Johann attestation of this note for management and plan.

## 2023-09-26 ENCOUNTER — Other Ambulatory Visit: Payer: Self-pay | Admitting: Internal Medicine

## 2023-09-26 DIAGNOSIS — M6289 Other specified disorders of muscle: Secondary | ICD-10-CM

## 2023-09-27 ENCOUNTER — Encounter: Payer: Self-pay | Admitting: Interventional Radiology

## 2023-09-27 ENCOUNTER — Ambulatory Visit

## 2023-10-01 ENCOUNTER — Encounter: Payer: Self-pay | Admitting: Family

## 2023-10-01 ENCOUNTER — Telehealth: Payer: Self-pay

## 2023-10-01 ENCOUNTER — Ambulatory Visit (INDEPENDENT_AMBULATORY_CARE_PROVIDER_SITE_OTHER): Payer: Self-pay | Admitting: Family

## 2023-10-01 ENCOUNTER — Other Ambulatory Visit: Payer: Self-pay

## 2023-10-01 ENCOUNTER — Other Ambulatory Visit (HOSPITAL_COMMUNITY): Payer: Self-pay

## 2023-10-01 ENCOUNTER — Encounter: Payer: Self-pay | Admitting: Nurse Practitioner

## 2023-10-01 VITALS — BP 147/81 | HR 79 | Temp 97.4°F | Wt 117.0 lb

## 2023-10-01 DIAGNOSIS — K6812 Psoas muscle abscess: Secondary | ICD-10-CM

## 2023-10-01 DIAGNOSIS — Z5181 Encounter for therapeutic drug level monitoring: Secondary | ICD-10-CM

## 2023-10-01 DIAGNOSIS — Z452 Encounter for adjustment and management of vascular access device: Secondary | ICD-10-CM

## 2023-10-01 NOTE — Telephone Encounter (Signed)
 Pharmacy Patient Advocate Encounter  Received notification from [employer] that Prior Authorization for [facility] has been DENIED.  Full denial letter will be uploaded to the media tab. See denial reason below.   PA #/Case ID/Reference #: [id number]

## 2023-10-01 NOTE — Telephone Encounter (Signed)
 FYI

## 2023-10-01 NOTE — Assessment & Plan Note (Signed)
 CK levels reviewed and within normal range. Continue therapeutic drug monitoring of CK while on Daptomycin .

## 2023-10-01 NOTE — Assessment & Plan Note (Signed)
 PIC line in place in right upper extremity and functioning appropriately. Dressing is clean and dry. Continue PICC line care per protocol.

## 2023-10-01 NOTE — Telephone Encounter (Addendum)
 Pharmacy Patient Advocate Encounter   Received notification from CoverMyMeds that prior authorization for S[facility]EXTRO is required/requested.   Insurance verification completed.   The patient is insured through [place] .   Per test claim: PA required; PA submitted to above mentioned insurance via CoverMyMeds Key/confirmation #/EOC [id number] Status is pending   CLINICAL QUESTIONS ANSWERED   PA Case ID #: [id number]

## 2023-10-01 NOTE — Progress Notes (Addendum)
 Subjective:   Patient ID: April Shaffer, female    DOB: [birth year], 73 y.o.   MRN: [id number]  Chief Complaint  Patient presents with   Follow-up    Psoas abscess    HPI:  April Shaffer is a 73 y.o. 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 Last seen by Dr. Overton on 09/12/23 following aspirate which was growing MRSA (R- tetracyclines) and plan for 6 weeks of induction with Daptomycin  followed by chronic suppression. Seen by IR on 09/25/23 with recommendations to continue with drain and noted concern for lucency around the sacroiliac fixation screws bilaterally possibly representing infection. Here today for hospitalization follow up.  April Shaffer has been doing okay since leaving the hospital and has been receiving daptomycin  as prescribed with no adverse side effects. PICC line functioning without problem. Drain remains in place. Has noted itching in her back and overall feels better. No fevers, chills or sweats.    Allergies  Allergen Reactions   Zestoretic  [Lisinopril -Hydrochlorothiazide ] Swelling and Other (See Comments)    Angioedema + Tongue swelling    Codeine Itching   Oxycodone  Itching and Other (See Comments)    Patient is taking in 2025   Ultram  [Tramadol ] Itching   Zocor  [Simvastatin ] Other (See Comments)    Muscle pain      Outpatient Medications Prior to Visit  Medication Sig Dispense Refill   acetaminophen  (TYLENOL ) 500 MG tablet Take 500 mg by mouth every 6 (six) hours as needed for mild pain (pain score 1-3), moderate pain (pain score 4-6) or headache.     amLODipine  (NORVASC ) 10 MG tablet Take 10 mg by mouth in  the morning.     aspirin  EC 81 MG tablet Take 1 tablet (81 mg total) by mouth 2 (two) times daily. To prevent blood clots for 30 days after surgery. 60 tablet 0   carvedilol  (COREG  CR) 10 MG 24 hr capsule Take 10 mg by mouth in the morning.     clopidogrel  (PLAVIX ) 75 MG tablet Take 75 mg by mouth in the morning.     cyanocobalamin  (VITAMIN B12) 1000 MCG/ML injection Inject 1,000 mcg into the muscle every 30 (thirty) days.     daptomycin  (CUBICIN ) IVPB Inject 450 mg into the vein daily. Indication:  Psoas abscess First Dose: Yes Last Day of Therapy:  10/24/23 Labs - Once weekly:  CBC/D, BMP, and CPK Labs - Once weekly: ESR and CRP Method of administration: IV Push Method of administration may be changed at the discretion of home infusion pharmacist based upon assessment of the patient and/or caregiver's ability to self-administer the medication ordered. 41 Units 0   ezetimibe  (ZETIA ) 10 MG tablet TAKE 1 TABLET EVERY DAY (NEED MD APPOINTMENT) (Patient taking differently: Take 10 mg by mouth daily.) 90 tablet 0   gabapentin  (NEURONTIN ) 300 MG capsule Take 300 mg by mouth in the morning, at noon, and at bedtime.     glucose blood test strip Check sugars twice daily 100 each 11   hydrochlorothiazide  (HYDRODIURIL ) 12.5 MG tablet Take 12.5 mg by mouth in the morning.     metFORMIN  (GLUCOPHAGE -XR) 750 MG 24 hr tablet Take 750 mg by mouth in  the morning.     ondansetron  (ZOFRAN ) 4 MG tablet Take 4 mg by mouth every 8 (eight) hours as needed for nausea or vomiting.     ondansetron  (ZOFRAN -ODT) 4 MG disintegrating tablet Take 1 tablet (4 mg total) by mouth every 8 (eight) hours as needed for nausea or vomiting. (Patient taking differently: Take 4 mg by mouth every 8 (eight) hours as needed for nausea or vomiting (DISSOLVE ORALLY).) 15 tablet 0   oxyCODONE -acetaminophen  (PERCOCET) 10-325 MG tablet Take 1 tablet by mouth every 4 (four) hours as needed for pain. (Patient taking differently: Take 1 tablet by  mouth See admin instructions. Take 1 tablet by mouth every 6-8 hours as needed for pain) 40 tablet 0   sennosides-docusate sodium  (SENOKOT-S) 8.6-50 MG tablet Take 2 tablets by mouth daily as needed for constipation (while taking narcotics). 30 tablet 1   sodium chloride  flush 0.9 % SOLN injection Flush with 5-10mL of saline daily to prevent clogging 200 mL 0   No facility-administered medications prior to visit.     Past Medical History:  Diagnosis Date   Anemia    Arthritis    Blood transfusion without reported diagnosis    Carotid artery narrowing    Coronary artery disease    Cardiac catheterization November 2013: 50% ostial LAD stenosis 50% mid stenosis. 30% disease in the left circumflex.   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   Sleep apnea       Had surgery to correct   Thyroid  nodule      Past Surgical History:  Procedure Laterality Date   ABDOMINAL AORTOGRAM W/LOWER EXTREMITY N/A 08/07/2021   Procedure: ABDOMINAL AORTOGRAM W/LOWER EXTREMITY;  Surgeon: Sheree Penne Bruckner, MD;  Location: [facility] INVASIVE CV LAB;  Service: Cardiovascular;  Laterality: N/A;   ABDOMINAL AORTOGRAM W/LOWER EXTREMITY Right 10/23/2021   Procedure: ABDOMINAL AORTOGRAM W/LOWER EXTREMITY;  Surgeon: Sheree Penne Bruckner, MD;  Location: [facility] INVAS[facility] CV LAB;  Service: Cardiovascular;  Laterality: Right;   ABDOMINAL HYSTERECTOMY     APPLICATION OF INTRAOPERATIVE CT SCAN N/A 11/30/2020   Procedure: APPLICATION OF INTRAOPERATIVE CT SCAN;  Surgeon: Joshua Alm RAMAN, MD;  Location: [facility] OR;  Service: Neurosurgery;  Laterality: N/A;   BACK SURGERY     CARDIAC CATHETERIZATION     CATARACT EXTRACTION, BILATERAL Bilateral 2021   Dr. Milissa   ENDARTERECTOMY  09/27/2011   Procedure: RIGT ENDARTERECTOMY CAROTID;  Surgeon: Lynwood JONETTA Collum, MD;  Location: [facility] OR;   Service: Vascular;  Laterality: Right;   EPIDURAL BLOCK INJECTION  02/2008   Drs. Clydell Mince   EYE SURGERY Bilateral 2021   cataract   gyn surgery  2004   total hysterectomy for mennorhagia,,salpingoophorectomy   INCISION AND DRAINAGE ABSCESS Left 05/08/2022   Procedure: ARTHROSCOPIC INCISION AND DRAINAGE ABSCESS, DISTAL CLAVICLE EXCISSION, SUBACROMIAL DECOMPRESSION, LOOSE BODY REMOVAL, EXTENSIVE DEBRIDEMENT;  Surgeon: Beverley Evalene JONETTA, MD;  Location: [facility] ORS;  Service: Orthopedics;  Laterality: Left;   LAMINECTOMY WITH POSTERIOR LATERAL ARTHRODESIS LEVEL 2 N/A 11/30/2020   Procedure: LUMBAR FOUR-FIVE, LUMBAR FIVE-SACRAL ONE POSTERIOR LATERAL FUSION WITH REVISION OF LUMBAR ONE-FIVE HARDWARE AND EXTENSION TO SACRAL ONE AND SACRAL TWO;  Surgeon: Joshua Alm RAMAN, MD;  Location: [facility] OR;  Service: Neurosurgery;  Laterality: N/A;   LUMBAR WOUND DEBRIDEMENT N/A 05/25/2020   Procedure: LUMBAR WOUND IRRIGATION AND DEBRIDEMENT;  Surgeon: Joshua Alm RAMAN, MD;  Location: [facility] OR;  Service: Neurosurgery;  Laterality: N/A;   PERIPHERAL VASCULAR INTERVENTION  08/07/2021   Procedure: PERIPHERAL VASCULAR INTERVENTION;  Surgeon: Sheree Penne Bruckner, MD;  Location: [facility] INVASIVE CV LAB;  Service: Cardiovascular;;  Rt SFA   PERIPHERAL VASCULAR INTERVENTION Right 10/23/2021   Procedure: PERIPHERAL VASCULAR INTERVENTION;  Surgeon: Sheree Penne Bruckner, MD;  Location: [facility] INVASIVE CV LAB;  Service: Cardiovascular;  Laterality: Right;  SFA   POSTERIOR LUMBAR FUSION 4 LEVEL N/A 04/25/2020   Procedure: POSTERIOR LUMBAR INTERBODY FUSION LUMBAR ONE-TWO, LUMBAR TWO-THREE, LUMBAR THREE-FOUR,LUMBAR FOUR-FIVE.;  Surgeon: Joshua Alm RAMAN, MD;  Location: [facility] OR;  Service: Neurosurgery;  Laterality: N/A;  posterior   SHOULDER ARTHROSCOPY Left 05/08/2022   Procedure: ARTHROSCOPY SHOULDER;  Surgeon: Beverley Evalene BIRCH, MD;  Location: [facility] ORS;  Service: Orthopedics;  Laterality: Left;   TONSILLECTOMY     TOTAL ABDOMINAL  HYSTERECTOMY W/ BILATERAL SALPINGOOPHORECTOMY Bilateral 2000   TOTAL HIP ARTHROPLASTY Right 08/22/2018   Procedure: TOTAL HIP ARTHROPLASTY;  Surgeon: Shari Sieving, MD;  Location: [facility] ORS;  Service: Orthopedics;  Laterality: Right;   TOTAL HIP ARTHROPLASTY Left 07/16/2023   Procedure: ARTHROPLASTY, HIP, TOTAL, ANTERIOR APPROACH;  Surgeon: Beverley Evalene BIRCH, MD;  Location: [facility] ORS;  Service: Orthopedics;  Laterality: Left;   TRANSCAROTID ARTERY REVASCULARIZATION  Left 08/07/2022   Procedure: Transcarotid Artery Revascularization;  Surgeon: Sheree Penne Bruckner, MD;  Location: [facility] OR;  Service: Vascular;  Laterality: Left;   ULTRASOUND GUIDANCE FOR VASCULAR ACCESS Left 08/07/2022   Procedure: ULTRASOUND GUIDANCE FOR VASCULAR ACCESS;  Surgeon: Sheree Penne Bruckner, MD;  Location: [facility] OR;  Service: Vascular;  Laterality: Left;   VARICOSE VEIN SURGERY  2008   stripping       Review of Systems  Constitutional:  Negative for chills, diaphoresis, fatigue and fever.  Respiratory:  Negative for cough, chest tightness, shortness of breath and wheezing.   Cardiovascular:  Negative for chest pain.  Gastrointestinal:  Negative for abdominal pain, diarrhea, nausea and vomiting.    Objective:   BP (!) 147/81   Pulse 79   Temp (!) 97.4 F (36.3 C) (Oral)   Wt 117 lb (53.1 kg)   SpO2 97%   BMI 20.73 kg/m  Nursing note and vital signs reviewed.  Physical Exam Constitutional:      General: She is not in acute distress.    Appearance: She is well-developed.  Cardiovascular:     Rate and Rhythm: Normal rate and regular rhythm.     Heart sounds: Normal heart sounds.     Comments: PICC line right upper extremity; dressing intact, clean and dry.  Pulmonary:     Effort: Pulmonary effort is normal.     Breath sounds: Normal breath sounds.  Musculoskeletal:     Comments: JP drain charged with serous appearing fluid.   Skin:    General: Skin is warm and dry.  Neurological:     Mental  Status: She is alert and oriented to person, place, and time.  Psychiatric:        Mood and Affect: Mood normal.         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   S/P total left hip arthroplasty 07/16/2023   Asymptomatic carotid artery stenosis without infarction, left 08/07/2022   Observation after surgery 08/07/2022   Septic arthritis of shoulder (HCC) 05/07/2022   Anemia of chronic disease 04/10/2022   Lumbar pseudoarthrosis 01/24/2022   Occlusion and stenosis of right carotid artery 01/24/2022   Iron  deficiency anemia secondary to blood loss (chronic) 01/08/2022   B12 deficiency 08/31/2021   Medication monitoring encounter 12/12/2020   Nausea 12/12/2020   Hammer toes of both feet 11/09/2020   Acute maxillary sinusitis 08/08/2020   Hyperlipidemia 08/08/2020   Pain due to onychomycosis of toenails of both feet 08/08/2020   PICC (peripherally inserted central catheter) in place 06/22/2020   Therapeutic drug monitoring 06/22/2020   Wound infection after surgery 05/25/2020   Wound drainage 05/24/2020   Local infection of the skin and subcutaneous tissue, unspecified 05/24/2020   Body mass index (BMI) 27.0-27.9, adult 05/10/2020   S/P lumbar fusion 04/25/2020   Microalbuminuria due to type 2 diabetes mellitus (HCC) 08/07/2019   Primary localized osteoarthritis of right hip 08/22/2018   Carpal tunnel syndrome, bilateral 08/06/2018   Type 2 diabetes mellitus with peripheral neuropathy (HCC) 08/06/2018   Degenerative joint disease of right hip 08/06/2018   Preoperative  clearance 03/26/2018   Upper back pain on left side 10/30/2016   Encounter for postoperative carotid endarterectomy surveillance 12/07/2015   Varicosities of leg 06/03/2015   Acute left lumbar radiculopathy 04/15/2014   Chronic sciatica of right side 03/17/2013   Diabetic peripheral neuropathy (HCC) 03/17/2013   Coronary artery disease    Carotid artery stenosis s/p CEA 2013 10/16/2011   Carotid artery occlusion without infarction, right 09/17/2011   Carotid bruit 08/28/2011   Gout 04/13/2011   DM type 2 with diabetic peripheral neuropathy (HCC) 04/02/2008   Hyperlipidemia associated with type 2 diabetes mellitus (HCC) 04/02/2008   Essential hypertension 04/02/2008   DEGENERATIVE DISC DISEASE, LUMBOSACRAL SPINE 04/02/2008     Problem List Items Addressed This Visit       Musculoskeletal and Integument   Psoas abscess, right (HCC) - Primary   Ms. Bramble has been receiving Daptomycin  with no adverse side effects. Inflammatory markers reviewed and slightly elevated this week compared to last week. Drain remains in place and last seen by Radiology on 7/30 with determination to continue with drain for now and possible concern for lucency around her bilateral sacroiliac fixation screws possibly representing new infection. Message has been sent to Neurosurgery for further evaluation to determine need for surgical intervention. Discussed plan of care to continue with current dose of Daptomycin  as planned through 10/24/23 followed by chronic suppression in the setting of retained hardware. Have asked pharmacy team to check availability of tedazolid given resistance of MRSA to doxycycline although Bactrim  is sensitive. Final determinations pending completion of treatment and any potential need for Neurosurgery intervention.   ADDENDUM: Spoke with Neurosurgery and per Dr. Joshua noted to have lucency around her screws 17 months ago and risk of taking them out would be too high for because she doesn't  heal her wounds well. Will continue with plan for treatment followed by suppression for now.         Other   PICC (peripherally inserted central catheter) in place   PIC line in place  in right upper extremity and functioning appropriately. Dressing is clean and dry. Continue PICC line care per protocol.       Therapeutic drug monitoring   CK levels reviewed and within normal range. Continue therapeutic drug monitoring of CK while on Daptomycin .         I am having Seraya C. Alabi maintain her glucose blood, clopidogrel , ezetimibe , oxyCODONE -acetaminophen , gabapentin , acetaminophen , hydrochlorothiazide , carvedilol , ondansetron , metFORMIN , amLODipine , cyanocobalamin , aspirin  EC, ondansetron , sennosides-docusate sodium , sodium chloride  flush, and daptomycin .   Follow-up: Return in about 3 weeks (around 10/22/2023). or sooner if needed.   Cathlyn July, MSN, FNP-C Nurse Practitioner [facility] Main number: [phone]

## 2023-10-01 NOTE — Assessment & Plan Note (Addendum)
 Ms. Barnhart has been receiving Daptomycin  with no adverse side effects. Inflammatory markers reviewed and slightly elevated this week compared to last week. Drain remains in place and last seen by Radiology on 7/30 with determination to continue with drain for now and possible concern for lucency around her bilateral sacroiliac fixation screws possibly representing new infection. Message has been sent to Neurosurgery for further evaluation to determine need for surgical intervention. Discussed plan of care to continue with current dose of Daptomycin  as planned through 10/24/23 followed by chronic suppression in the setting of retained hardware. Have asked pharmacy team to check availability of tedazolid given resistance of MRSA to doxycycline although Bactrim  is sensitive. Final determinations pending completion of treatment and any potential need for Neurosurgery intervention.   ADDENDUM: Spoke with Neurosurgery and per Dr. Joshua noted to have lucency around her screws 17 months ago and risk of taking them out would be too high for because she doesn't heal her wounds well. Will continue with plan for treatment followed by suppression for now.

## 2023-10-01 NOTE — Patient Instructions (Addendum)
 Nice to see you.  Continue with daptomycin  through 10/24/23 as planned followed by oral suppression.   Follow up with Radiology team for drain management.   Refills have been sent to the pharmacy.  Plan for follow up in 3 weeks or sooner if needed with lab work on the same day.  Have a great day and stay safe!

## 2023-10-02 ENCOUNTER — Other Ambulatory Visit (HOSPITAL_COMMUNITY): Payer: Self-pay

## 2023-10-02 ENCOUNTER — Telehealth: Payer: Self-pay

## 2023-10-02 MED ORDER — SODIUM CHLORIDE FLUSH 0.9 % IV SOLN
10.0000 mL | INTRAVENOUS | 0 refills | Status: AC | PRN
Start: 2023-10-02 — End: ?
  Filled 2023-10-02: qty 200, 20d supply, fill #0

## 2023-10-02 NOTE — Telephone Encounter (Signed)
 Patient called with concern that she will run out of flushes before her next appointment. Refill sent to [facility], and patient reached via phone to communicate this.

## 2023-10-03 ENCOUNTER — Other Ambulatory Visit (HOSPITAL_COMMUNITY): Payer: Self-pay

## 2023-10-09 ENCOUNTER — Ambulatory Visit
Admission: RE | Admit: 2023-10-09 | Discharge: 2023-10-09 | Disposition: A | Discharge status: Home / Self Care | Source: Ambulatory Visit | Attending: Internal Medicine | Admitting: Internal Medicine

## 2023-10-09 ENCOUNTER — Ambulatory Visit
Admission: RE | Admit: 2023-10-09 | Discharge: 2023-10-09 | Disposition: A | Discharge status: Home / Self Care | Source: Ambulatory Visit | Attending: Urology | Admitting: Urology

## 2023-10-09 DIAGNOSIS — M6289 Other specified disorders of muscle: Secondary | ICD-10-CM

## 2023-10-09 DIAGNOSIS — K6812 Psoas muscle abscess: Secondary | ICD-10-CM

## 2023-10-09 DIAGNOSIS — T8149XA Infection following a procedure, other surgical site, initial encounter: Secondary | ICD-10-CM

## 2023-10-09 MED ORDER — IOPAMIDOL (ISOVUE-300) INJECTION 61%
100.0000 mL | Freq: Once | INTRAVENOUS | Status: AC | PRN
  Administered 2023-10-09 (×2): 100 mL via INTRAVENOUS

## 2023-10-09 NOTE — Progress Notes (Signed)
 Patient ID: April Shaffer, female   DOB: [birth year], 73 y.o.   MRN: [id number]   Referring Physician(s): Vu,Trung T  Chief Complaint: The patient is seen in follow up today s/p right psoas abscess drain placement 7/17  History of present illness:  April Shaffer is a 73 y.o. female smoking, arthritis, CAD on ASA/Plavix , T2DM, PAD w/ right SFA stent, total left hip arthroplasty, and previous lumbar spine surgery in 2022 who presented to the [facility] ED on 7/15 with weakness, nausea, and a bump on her back in the area of her prior spinal surgery. Neurosurgery attempted aspiration of the area without success. MRI was ordered on 7/11 which showed post-operative changes, with a fluid collection in the right psoas muscle. IR was consulted and a drain was placed on 7/17 by Dr. Philip. Cultures were positive for S. Aureus. Patient was discharged home on 7/19, with the drain in place.  She was seen in IR outpt clinic follow up 09/25/23 where she reported 5 mL of consistent output from the drain daily.  She had CT imaging done at that time with recommendation to return in 2 to 3 weeks for repeat CT imaging and possible drain injection.  There was also concern for possible lucency, indicating possible infection, around her sacroiliac fixation screws bilaterally.  She followed up with infectious disease on 10/01/2023 regarding this who reached out to her neurosurgery team, who reported that these lucencies were present 17 months ago as well, no concern or plan for intervention at this time.  Patient returning back to clinic today for CT imaging and possible drain injection.  She reports doing well with PICC line IV antibiotic therapies, overall feels well and improved from last outpatient follow-up visit.  Past Medical History:  Diagnosis Date   Anemia    Arthritis    Blood transfusion without reported diagnosis    Carotid artery narrowing    Coronary artery disease    Cardiac catheterization November 2013: 50%  ostial LAD stenosis 50% mid stenosis. 30% disease in the left circumflex.   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   Sleep apnea       Had surgery to correct   Thyroid  nodule     Past Surgical History:  Procedure Laterality Date   ABDOMINAL AORTOGRAM W/LOWER EXTREMITY N/A 08/07/2021   Procedure: ABDOMINAL AORTOGRAM W/LOWER EXTREMITY;  Surgeon: Sheree Penne Bruckner, MD;  Location: [facility] INVASIVE CV LAB;  Service: Cardiovascular;  Laterality: N/A;   ABDOMINAL AORTOGRAM W/LOWER EXTREMITY Right 10/23/2021   Procedure: ABDOMINAL AORTOGRAM W/LOWER EXTREMITY;  Surgeon: Sheree Penne Bruckner, MD;  Location: [facility] INVASIVE CV LAB;  Service: Cardiovascular;  Laterality: Right;   ABDOMINAL HYSTERECTOMY     APPLICATION OF INTRAOPERATIVE CT SCAN N/A 11/30/2020   Procedure: APPLICATION OF INTRAOPERATIVE CT SCAN;  Surgeon: Joshua Alm RAMAN, MD;  Location: [facility] OR;  Service: Neurosurgery;  Laterality: N/A;   BACK SURGERY     CARDIAC CATHETERIZATION     CATARACT EXTRACTION, BILATERAL Bilateral 2021   Dr. Milissa   ENDARTERECTOMY  09/27/2011   Procedure: RIGT ENDARTERECTOMY CAROTID;  Surgeon: Lynwood JONETTA Collum, MD;  Location: [facility] OR;  Service: Vascular;  Laterality: Right;   EPIDURAL BLOCK INJECTION  02/2008   Drs. Clydell Mince   EYE SURGERY Bilateral 2021   cataract   gyn surgery  2004  total hysterectomy for mennorhagia,,salpingoophorectomy   INCISION AND DRAINAGE ABSCESS Left 05/08/2022   Procedure: ARTHROSCOPIC INCISION AND DRAINAGE ABSCESS, DISTAL CLAVICLE EXCISSION, SUBACROMIAL DECOMPRESSION, LOOSE BODY REMOVAL, EXTENSIVE DEBRIDEMENT;  Surgeon: Beverley Evalene BIRCH, MD;  Location: [facility] ORS;  Service: Orthopedics;  Laterality: Left;   LAMINECTOMY WITH POSTERIOR LATERAL ARTHRODESIS LEVEL 2 N/A 11/30/2020   Procedure: LUMBAR FOUR-FIVE, LUMBAR FIVE-SACRAL  ONE POSTERIOR LATERAL FUSION WITH REVISION OF LUMBAR ONE-FIVE HARDWARE AND EXTENSION TO SACRAL ONE AND SACRAL TWO;  Surgeon: Joshua Alm RAMAN, MD;  Location: [facility] OR;  Service: Neurosurgery;  Laterality: N/A;   LUMBAR WOUND DEBRIDEMENT N/A 05/25/2020   Procedure: LUMBAR WOUND IRRIGATION AND DEBRIDEMENT;  Surgeon: Joshua Alm RAMAN, MD;  Location: [facility] OR;  Service: Neurosurgery;  Laterality: N/A;   PERIPHERAL VASCULAR INTERVENTION  08/07/2021   Procedure: PERIPHERAL VASCULAR INTERVENTION;  Surgeon: Sheree Penne Bruckner, MD;  Location: [facility] INVASIVE CV LAB;  Service: Cardiovascular;;  Rt SFA   PERIPHERAL VASCULAR INTERVENTION Right 10/23/2021   Procedure: PERIPHERAL VASCULAR INTERVENTION;  Surgeon: Sheree Penne Bruckner, MD;  Location: [facility] INVASIVE CV LAB;  Service: Cardiovascular;  Laterality: Right;  SFA   POSTERIOR LUMBAR FUSION 4 LEVEL N/A 04/25/2020   Procedure: POSTERIOR LUMBAR INTERBODY FUSION LUMBAR ONE-TWO, LUMBAR TWO-THREE, LUMBAR THREE-FOUR,LUMBAR FOUR-FIVE.;  Surgeon: Joshua Alm RAMAN, MD;  Location: [facility] OR;  Service: Neurosurgery;  Laterality: N/A;  posterior   SHOULDER ARTHROSCOPY Left 05/08/2022   Procedure: ARTHROSCOPY SHOULDER;  Surgeon: Beverley Evalene BIRCH, MD;  Location: [facility] OR[facility];  Service: Orthopedics;  Laterality: Left;   TONSILLECTOMY     TOTAL ABDOMINAL HYSTERECTOMY W/ BILATERAL SALPINGOOPHORECTOMY Bilateral 2000   TOTAL HIP ARTHROPLASTY Right 08/22/2018   Procedure: TOTAL HIP ARTHROPLASTY;  Surgeon: Shari Sieving, MD;  Location: [facility] OR[facility];  Service: Orthopedics;  Laterality: Right;   TOTAL HIP ARTHROPLASTY Left 07/16/2023   Procedure: ARTHROPLASTY, HIP, TOTAL, ANTERIOR APPROACH;  Surgeon: Beverley Evalene BIRCH, MD;  Location: [facility] ORS;  Service: Orthopedics;  Laterality: Left;   TRANSCAROTID ARTERY REVASCULARIZATION  Left 08/07/2022   Procedure: Transcarotid Artery Revascularization;  Surgeon: Sheree Penne Bruckner, MD;  Location: [facility] OR;  Service: Vascular;  Laterality: Left;   ULTRASOUND  GUIDANCE FOR VASCULAR ACCESS Left 08/07/2022   Procedure: ULTRASOUND GUIDANCE FOR VASCULAR ACCESS;  Surgeon: Sheree Penne Bruckner, MD;  Location: [facility] OR;  Service: Vascular;  Laterality: Left;   VARICOSE VEIN SURGERY  2008   stripping    Allergies: Zestoretic  [lisinopril -hydrochlorothiazide ], Codeine, Oxycodone , Ultram  [tramadol ], and Zocor  [simvastatin ]  Medications: Prior to Admission medications   Medication Sig Start Date End Date Taking? Authorizing Provider  acetaminophen  (TYLENOL ) 500 MG tablet Take 500 mg by mouth every 6 (six) hours as needed for mild pain (pain score 1-3), moderate pain (pain score 4-6) or headache.    [provider]  amLODipine  (NORVASC ) 10 MG tablet Take 10 mg by mouth in the morning. 06/18/23   [provider]  aspirin  EC 81 MG tablet Take 1 tablet (81 mg total) by mouth 2 (two) times daily. To prevent blood clots for 30 days after surgery. 07/17/23   Gawne, Meghan M, PA-C  carvedilol  (COREG  CR) 10 MG 24 hr capsule Take 10 mg by mouth in the morning. 05/11/23   [provider]  clopidogrel  (PLAVIX ) 75 MG tablet Take 75 mg by mouth in the morning.    [provider]  cyanocobalamin  (VITAMIN B12) 1000 MCG/ML injection Inject 1,000 mcg into the muscle every 30 (thirty) days.    [provider]  daptomycin  (CUBICIN ) IVPB  Inject 450 mg into the vein daily. Indication:  Psoas abscess First Dose: Yes Last Day of Therapy:  10/24/23 Labs - Once weekly:  CBC/D, BMP, and CPK Labs - Once weekly: ESR and CRP Method of administration: IV Push Method of administration may be changed at the discretion of home infusion pharmacist based upon assessment of the patient and/or caregiver's ability to self-administer the medication ordered. 09/13/23 10/24/23  Vu, Constance DASEN, MD  ezetimibe  (ZETIA ) 10 MG tablet TAKE 1 TABLET EVERY DAY (NEED MD APPOINTMENT) Patient taking differently: Take 10 mg by mouth daily. 09/27/20   Court Dorn PARAS, MD   gabapentin  (NEURONTIN ) 300 MG capsule Take 300 mg by mouth in the morning, at noon, and at bedtime. 06/09/21   [provider]  glucose blood test strip Check sugars twice daily 01/31/16   Adella Norris, MD  hydrochlorothiazide  (HYDRODIURIL ) 12.5 MG tablet Take 12.5 mg by mouth in the morning.    [provider]  metFORMIN  (GLUCOPHAGE -XR) 750 MG 24 hr tablet Take 750 mg by mouth in the morning. 06/07/23   [provider]  ondansetron  (ZOFRAN ) 4 MG tablet Take 4 mg by mouth every 8 (eight) hours as needed for nausea or vomiting. 05/20/23   [provider]  ondansetron  (ZOFRAN -ODT) 4 MG disintegrating tablet Take 1 tablet (4 mg total) by mouth every 8 (eight) hours as needed for nausea or vomiting. Patient taking differently: Take 4 mg by mouth every 8 (eight) hours as needed for nausea or vomiting (DISSOLVE ORALLY). 07/17/23   Gawne, Meghan M, PA-C  oxyCODONE -acetaminophen  (PERCOCET) 10-325 MG tablet Take 1 tablet by mouth every 4 (four) hours as needed for pain. Patient taking differently: Take 1 tablet by mouth See admin instructions. Take 1 tablet by mouth every 6-8 hours as needed for pain 12/03/20   Joshua Alm Hamilton, MD  sennosides-docusate sodium  (SENOKOT-S) 8.6-50 MG tablet Take 2 tablets by mouth daily as needed for constipation (while taking narcotics). 07/17/23   Gawne, Meghan M, PA-C  sodium chloride  flush 0.9 % SOLN injection Flush with 5-10mL of saline daily to prevent clogging 10/02/23   H[place]t, [place], NP     Family History  Problem Relation Age of Onset   Hypertension Sister    Hypothyroidism Sister    Cancer Maternal Grandmother        lung   Hypertension Son    Hypertension Sister    Diabetes Brother        lost toe in 2018 with new diagnosis of DM   Hypertension Son     Social History   Socioeconomic History   Marital status: Significant Other    Spouse name: Not on file   Number of children: 2   Years of education: 12    Highest education level: Not on file  Occupational History   Occupation: housekeeping-retired; 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    Comment: [employer]-retired 07/23/2014  Tobacco Use   Smoking status: Every Day    Current packs/day: 0.25    Average packs/day: 0.3 packs/day for 55.4 years (13.8 ttl pk-yrs)    Types: Cigarettes    Start date: 05/31/1968    Passive exposure: Never   Smokeless tobacco: Never   Tobacco comments:    Smoked x50 years, 1 pack would last 3 days. Currently smoking 4-5 cigarettes per day  Vaping Use   Vaping status: Never Used  Substance and Sexual Activity   Alcohol use: Not Currently    Alcohol/week: 14.0 standard  drinks of alcohol    Types: 14 Standard drinks or equivalent per week    Comment: 1-2 drinks every other day   Drug use: No   Sexual activity: Not on file  Other Topics Concern   Not on file  Social History Narrative   Lives with long term boyfriend (30 years together)   Both sons live in [place]   Social Drivers of Health   Financial Resource Strain: Low Risk  (08/06/2018)   Overall Financial Resource Strain (CARDIA)    Difficulty of Paying Living Expenses: Not hard at all  Food Insecurity: No Food Insecurity (09/18/2023)   Hunger Vital Sign    Worried About Running Out of Food in the Last Year: Never true    Ran Out of Food in the Last Year: Never true  Transportation Needs: No Transportation Needs (09/18/2023)   PRAPARE - Administrator, Civil Service (Medical): No    Lack of Transportation (Non-Medical): No  Physical Activity: Inactive (06/17/2017)   Exercise Vital Sign    Days of Exercise per Week: 0 days    Minutes of Exercise per Session: 0 min  Stress: No Stress Concern Present (06/17/2017)   [employer] of Occupational Health - Occupational Stress Questionnaire    Feeling of Stress : Not at all  Social Connections: Unknown (09/11/2023)   Social Connection and Isolation Panel    Frequency of  Communication with Friends and Family: Not on file    Frequency of Social Gatherings with Friends and Family: Not on file    Attends Religious Services: More than 4 times per year    Active Member of [employer] or Organizations: Not on file    Attends Banker Meetings: More than 4 times per year    Marital Status: Living with partner     Vital Signs: There were no vitals taken for this visit.  Physical Exam Vitals and nursing note reviewed.  Constitutional:      Appearance: Normal appearance.  Pulmonary:     Effort: Pulmonary effort is normal.  Abdominal:     Palpations: Abdomen is soft.     Tenderness: There is no abdominal tenderness.  Skin:    General: Skin is warm and dry.     Comments: + RLQ/Right lateral drain. Minimal amount of lightly yellow tinged and largely clear output in bulb (~31ml or less). No surrounding abnormality, no pain.  Neurological:     Mental Status: She is alert and oriented to person, place, and time. Mental status is at baseline.     Imaging: No results found.  Labs:  CBC: Recent Labs    09/11/23 1605 09/12/23 0536 09/12/23 1720 09/13/23 0343 09/18/23 1324  WBC 12.0* 12.3*  --  14.2* 11.7*  HGB 8.1* 6.8* 8.8* 8.7* 9.1*  HCT 27.1* 22.5* 28.6* 28.0* 28.9*  PLT 500* 414*  --  420* 347    COAGS: Recent Labs    09/12/23 0944  INR 1.2    BMP: Recent Labs    07/08/23 1347 09/10/23 1245 09/11/23 1605 09/12/23 0352  NA 136 134* 135 136  K 3.0* 3.1* 3.3* 3.5  CL 100 102 103 105  CO2 23 22 23 23   GLUCOSE 106* 151* 131* 92  BUN 16 12 16 16   CALCIUM  8.5* 9.0 8.7* 8.2*  CREATININE 0.91 1.04* 0.97 0.98  GFRNONAA >60 57* >60 >60    LIVER FUNCTION TESTS: Recent Labs    09/10/23 1245 09/11/23 1605  BILITOT 0.4 0.5  AST 11* 11*  ALT 8 6  ALKPHOS 93 93  PROT 9.6* 9.2*  ALBUMIN  2.2* 2.4*    Assessment:  Right psoas fluid collection s/p aspiration and drainage on 09/12/23 -Drain appears well with output significantly  reduced from prior visit.  Patient's output down to 3 ml yesterday and 5 ml for 4  days prior.  Significantly reduced from 25 mL at prior visit 09/25/2023 - No overlying  abnormality or pain. - Repeat CT imaging today reviewed by Dr. Majel, abscess cavity significantly improved/resolved. Given no prior GI involvement and significantly decreased output  with improved symptoms, no indication for drain injection today. - Decision made for drain to be pulled.  IR tech pulled drain in clinic today. - Patient instructed to continue antibiotics as planned with infectious disease as well as follow-ups with her surgery teams.  Signed: Kimble VEAR Clas, PA-C 10/09/2023, 10:49 AM   Please refer to Dr. Thedford attestation of this note for management and plan.

## 2023-10-16 ENCOUNTER — Inpatient Hospital Stay: Attending: Nurse Practitioner

## 2023-10-16 DIAGNOSIS — D508 Other iron deficiency anemias: Secondary | ICD-10-CM | POA: Diagnosis present

## 2023-10-16 DIAGNOSIS — E538 Deficiency of other specified B group vitamins: Secondary | ICD-10-CM | POA: Insufficient documentation

## 2023-10-16 MED ORDER — CYANOCOBALAMIN 1000 MCG/ML IJ SOLN
1000.0000 ug | Freq: Once | INTRAMUSCULAR | Status: AC
  Administered 2023-10-16: 1000 ug via INTRAMUSCULAR
  Filled 2023-10-16: qty 1

## 2023-10-23 ENCOUNTER — Ambulatory Visit (INDEPENDENT_AMBULATORY_CARE_PROVIDER_SITE_OTHER): Admitting: Podiatry

## 2023-10-23 ENCOUNTER — Telehealth: Payer: Self-pay

## 2023-10-23 ENCOUNTER — Encounter: Payer: Self-pay | Admitting: Podiatry

## 2023-10-23 DIAGNOSIS — M79674 Pain in right toe(s): Secondary | ICD-10-CM

## 2023-10-23 DIAGNOSIS — I739 Peripheral vascular disease, unspecified: Secondary | ICD-10-CM

## 2023-10-23 DIAGNOSIS — M79675 Pain in left toe(s): Secondary | ICD-10-CM

## 2023-10-23 DIAGNOSIS — E1142 Type 2 diabetes mellitus with diabetic polyneuropathy: Secondary | ICD-10-CM

## 2023-10-23 DIAGNOSIS — B351 Tinea unguium: Secondary | ICD-10-CM

## 2023-10-23 NOTE — Progress Notes (Signed)
 Subjective:  Patient ID: April Shaffer, female    DOB: 1950-03-20,  MRN: [id number]  April Shaffer presents to clinic today for at risk footcare. Patient has h/o diabetes, neuropathy and PAD and is seen for  and callus(es) L hallux and painful thick toenails that are difficult to trim. Painful toenails interfere with ambulation. Aggravating factors include wearing enclosed shoe gear. Pain is relieved with periodic professional debridement. Painful calluses are aggravated when weightbearing with and without shoegear. Pain is relieved with periodic professional debridement.  Patient states she has been hospitalized for an infection in her infection. Followed by ID. She has been on IV antibiotics at home and is scheduled to have PICC line removed by home health nurse on tomorrow. Patient c/o right lower extremity pain, particularly the back of her right calf muscle as well as her right foot. Pictures were taken, but did not cross over into Epic. Chief Complaint  Patient presents with   Diabetes    DFC NIDDM A1C 6.7. Toenail trim. LOV with PCP 10/14/23    PCP is Claudene Prentice DELENA Mickey., FNP.  Allergies  Allergen Reactions   Zestoretic  [Lisinopril -Hydrochlorothiazide ] Swelling and Other (See Comments)    Angioedema + Tongue swelling    Codeine Itching   Oxycodone  Itching and Other (See Comments)    Patient is taking in 2025   Ultram  [Tramadol ] Itching   Zocor  [Simvastatin ] Other (See Comments)    Muscle pain    Review of Systems: Negative except as noted in the HPI.  Objective: No changes noted in today's physical examination. There were no vitals filed for this visit. April Shaffer is a pleasant 73 y.o. female thin build in NAD. AAO x 3.  Vascular Examination: CFT <3 seconds LLE, but CFT delayed RLE. Pedal pulses RLE  nonpalpable. Pedal pulses LLE faintly palpable. Digital hair absent. Skin temperature gradient warm to cool b/l. No pain with calf compression. No unilateral edema RLE. No  ischemia or gangrene. No cyanosis or clubbing noted b/l.    Neurological Examination: Protective sensation decreased with 10 gram monofilament b/l. Vibratory sensation decreased b/l.  Dermatological Examination: Pedal skin warm and supple b/l. No open wounds b/l. No interdigital macerations. Toenails 1-5 b/l thick, discolored, elongated with subungual debris and pain on dorsal palpation. Minimal hyperkeratosis lesion(s) medial IPJ left great toe.  No erythema, no edema, no drainage, no fluctuance.     Musculoskeletal Examination: Muscle strength 5/5 to all lower extremity muscle groups bilaterally. No pain, crepitus or joint limitation noted with ROM bilateral LE.  Radiographs: None  Assessment/Plan: 1. Pain due to onychomycosis of toenails of both feet   2. PAD (peripheral artery disease) (HCC)   3. DM type 2 with diabetic peripheral neuropathy [facility])   Patient was evaluated and treated. She saw Vascular in March. She has new onset of RLE pain.  I would like her to see them again to check ABIs. All patient's and/or POA's questions/concerns addressed on today's visit. Mycotic toenails 1-5 debrided in length and girth without incident. Continue soft, supportive shoe gear daily. Report any pedal injuries to medical professional. Call office if there are any quesitons/concerns.  No follow-ups on file.  April Shaffer, DPM      [facility] LOCATION: [address]  [place], [place] [address]                   Office [phone]   [facility] LOCATION: [address] [place], [place] [address] Office [phone]

## 2023-10-23 NOTE — Telephone Encounter (Signed)
 Received call from Amy with [facility] requesting pull PICC orders. Relayed verbal orders that okay to pull PICC after last dose on 8/28 per Dr. Chapman OPAT note.   Amy [phone]  Duwaine Lowe, BSN, RN

## 2023-10-23 NOTE — Telephone Encounter (Signed)
 Pt called reporting no feeling in toes on right foot and pain in back of her calf.   LE ART and ABI scheduled 10/24/23.  Appt with Dr. Sheree on 12/11/23.   Patient is aware.

## 2023-10-24 ENCOUNTER — Other Ambulatory Visit: Payer: Self-pay | Admitting: *Deleted

## 2023-10-24 ENCOUNTER — Ambulatory Visit (HOSPITAL_COMMUNITY)
Admission: RE | Admit: 2023-10-24 | Discharge: 2023-10-24 | Disposition: A | Discharge status: Home / Self Care | Source: Ambulatory Visit | Attending: Vascular Surgery | Admitting: Vascular Surgery

## 2023-10-24 ENCOUNTER — Ambulatory Visit (HOSPITAL_BASED_OUTPATIENT_CLINIC_OR_DEPARTMENT_OTHER)
Admission: RE | Admit: 2023-10-24 | Discharge: 2023-10-24 | Disposition: A | Discharge status: Home / Self Care | Source: Ambulatory Visit | Attending: Vascular Surgery | Admitting: Vascular Surgery

## 2023-10-24 DIAGNOSIS — M79604 Pain in right leg: Secondary | ICD-10-CM | POA: Insufficient documentation

## 2023-10-24 LAB — VAS US ABI WITH/WO TBI: Right ABI: 0.53

## 2023-10-29 ENCOUNTER — Ambulatory Visit: Admitting: Family

## 2023-10-29 ENCOUNTER — Other Ambulatory Visit: Payer: Self-pay

## 2023-10-29 ENCOUNTER — Encounter: Payer: Self-pay | Admitting: Family

## 2023-10-29 VITALS — BP 167/80 | HR 99 | Temp 97.4°F | Wt 113.0 lb

## 2023-10-29 DIAGNOSIS — B9689 Other specified bacterial agents as the cause of diseases classified elsewhere: Secondary | ICD-10-CM | POA: Diagnosis not present

## 2023-10-29 DIAGNOSIS — Z452 Encounter for adjustment and management of vascular access device: Secondary | ICD-10-CM | POA: Diagnosis not present

## 2023-10-29 DIAGNOSIS — T847XXD Infection and inflammatory reaction due to other internal orthopedic prosthetic devices, implants and grafts, subsequent encounter: Secondary | ICD-10-CM

## 2023-10-29 DIAGNOSIS — K6812 Psoas muscle abscess: Secondary | ICD-10-CM | POA: Diagnosis not present

## 2023-10-29 MED ORDER — SULFAMETHOXAZOLE-TRIMETHOPRIM 800-160 MG PO TABS: 1.0000 | ORAL_TABLET | Freq: Two times a day (BID) | ORAL | 5 refills | Status: DC

## 2023-10-29 NOTE — Patient Instructions (Signed)
 Nice to see you.  Start taking Bactrim  twice daily.   Plan for follow up in 1 months or sooner if needed with lab work on the same day.  Have a great day and stay safe!

## 2023-10-29 NOTE — Assessment & Plan Note (Addendum)
 Completed course of Daptomycin  and s/p percutaneous drain removal. Discussed plan of care and need for continued suppression with presence of hardware and no new neurosurgical intervention planned. Tedazolid was declined by insurance and will start Bactrim  800-160 mg PO bid for suppression as doxycycline is resistant. Encouraged yogurt as needed for probiotic and can use fluconzaole 150 mg in event of candida infection. Will need therapeutic drug monitoring of renal function and will plan to check in 1 month or sooner if needed.

## 2023-10-29 NOTE — Assessment & Plan Note (Signed)
 IV treatment completed and PICC line removed without complication.

## 2023-10-29 NOTE — Progress Notes (Signed)
 Subjective:   Patient ID: April Shaffer, female    DOB: 1950-10-11, 73 y.o.   MRN: [id number]  Chief Complaint  Patient presents with   MRSA infection    HPI:  April Shaffer is a 73 y.o. 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 No additional surgical interventions recommended per Neurosurgery. Here today for follow up.  April Shaffer completed her Daptomycin  on 8/28 without complication and PICC line has been removed. Continues to have itching but no significant back pain and is ambulating with a cane. No fevers, chills or night sweats. Has questions about the plan of care and probiotics.    Allergies  Allergen Reactions   Zestoretic  [Lisinopril -Hydrochlorothiazide ] Swelling and Other (See Comments)    Angioedema + Tongue swelling    Codeine Itching   Oxycodone  Itching and Other (See Comments)    Patient is taking in 2025   Ultram  [Tramadol ] Itching   Zocor  [Simvastatin ] Other (See Comments)    Muscle pain      Outpatient Medications Prior to Visit  Medication Sig Dispense Refill   acetaminophen  (TYLENOL ) 500 MG tablet Take 500 mg by mouth every 6 (six) hours as needed for mild pain (pain score 1-3), moderate pain (pain score 4-6) or headache.     amLODipine  (NORVASC ) 10 MG tablet Take 10 mg by mouth in the morning.     aspirin  EC 81 MG tablet Take 1 tablet (81 mg total) by mouth 2 (two) times daily. To prevent blood clots for 30 days after surgery. 60 tablet 0   carvedilol  (COREG  CR) 10 MG 24 hr capsule Take 10 mg by mouth in the morning.     clopidogrel  (PLAVIX ) 75 MG tablet Take 75 mg by mouth in the morning.     cyanocobalamin  (VITAMIN B12) 1000 MCG/ML injection Inject 1,000 mcg into the muscle every 30 (thirty) days.     ezetimibe  (ZETIA ) 10 MG tablet TAKE 1 TABLET EVERY DAY (NEED MD APPOINTMENT) (Patient taking differently: Take 10 mg by mouth daily.) 90  tablet 0   gabapentin  (NEURONTIN ) 300 MG capsule Take 300 mg by mouth in the morning, at noon, and at bedtime.     glucose blood test strip Check sugars twice daily 100 each 11   hydrochlorothiazide  (HYDRODIURIL ) 12.5 MG tablet Take 12.5 mg by mouth in the morning.     metFORMIN  (GLUCOPHAGE -XR) 750 MG 24 hr tablet Take 750 mg by mouth in the morning.     ondansetron  (ZOFRAN ) 4 MG tablet Take 4 mg by mouth every 8 (eight) hours as needed for nausea or vomiting.     ondansetron  (ZOFRAN -ODT) 4 MG disintegrating tablet Take 1 tablet (4 mg total) by mouth every 8 (eight) hours as needed for nausea or vomiting. (Patient taking differently: Take 4 mg by mouth every 8 (eight) hours as needed for nausea or vomiting (DISSOLVE ORALLY).) 15 tablet 0   oxyCODONE -acetaminophen  (PERCOCET) 10-325 MG tablet Take 1 tablet by mouth every 4 (four) hours as needed for pain. (Patient taking differently: Take 1 tablet by mouth See admin instructions. Take 1 tablet by mouth every 6-8 hours as needed for pain) 40 tablet 0   sennosides-docusate sodium  (SENOKOT-S) 8.6-50 MG tablet Take 2 tablets by mouth daily as needed for constipation (while taking narcotics). 30 tablet 1   sodium chloride  flush 0.9 % SOLN injection Flush with 5-10mL of saline daily to prevent clogging 200  mL 0   No facility-administered medications prior to visit.     Past Medical History:  Diagnosis Date   Anemia    Arthritis    Blood transfusion without reported diagnosis    Carotid artery narrowing    Coronary artery disease    Cardiac catheterization November 2013: 50% ostial LAD stenosis 50% mid stenosis. 30% disease in the left circumflex.   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   Sleep apnea       Had surgery to correct   Thyroid  nodule      Past Surgical History:  Procedure Laterality  Date   ABDOMINAL AORTOGRAM W/LOWER EXTREMITY N/A 08/07/2021   Procedure: ABDOMINAL AORTOGRAM W/LOWER EXTREMITY;  Surgeon: Sheree Penne Bruckner, MD;  Location: [facility] INVASIVE CV LAB;  Service: Cardiovascular;  Laterality: N/A;   ABDOMINAL AORTOGRAM W/LOWER EXTREMITY Right 10/23/2021   Procedure: ABDOMINAL AORTOGRAM W/LOWER EXTREMITY;  Surgeon: Sheree Penne Bruckner, MD;  Location: [facility] INVASIVE CV LAB;  Service: Cardiovascular;  Laterality: Right;   ABDOMINAL HYSTERECTOMY     APPLICATION OF INTRAOPERATIVE CT SCAN N/A 11/30/2020   Procedure: APPLICATION OF INTRAOPERATIVE CT SCAN;  Surgeon: Joshua Alm RAMAN, MD;  Location: [facility] OR;  Service: Neurosurgery;  Laterality: N/A;   BACK SURGERY     CARDIAC CATHETERIZATION     CATARACT EXTRACTION, BILATERAL Bilateral 2021   Dr. Milissa   ENDARTERECTOMY  09/27/2011   Procedure: RIGT ENDARTERECTOMY CAROTID;  Surgeon: Lynwood JONETTA Collum, MD;  Location: [facility] OR;  Service: Vascular;  Laterality: Right;   EPIDURAL BLOCK INJECTION  02/2008   Drs. Clydell Mince   EYE SURGERY Bilateral 2021   cataract   gyn surgery  2004   total hysterectomy for mennorhagia,,salpingoophorectomy   INCISION AND DRAINAGE ABSCESS Left 05/08/2022   Procedure: ARTHROSCOPIC INCISION AND DRAINAGE ABSCESS, DISTAL CLAVICLE EXCISSION, SUBACROMIAL DECOMPRESSION, LOOSE BODY REMOVAL, EXTENSIVE DEBRIDEMENT;  Surgeon: Beverley Evalene JONETTA, MD;  Location: [facility] ORS;  Service: Orthopedics;  Laterality: Left;   IR RADIOLOGIST EVAL & MGMT  10/09/2023   LAMINECTOMY WITH POSTERIOR LATERAL ARTHRODESIS LEVEL 2 N/A 11/30/2020   Procedure: LUMBAR FOUR-FIVE, LUMBAR FIVE-SACRAL ONE POSTERIOR LATERAL FUSION WITH REVISION OF LUMBAR ONE-FIVE HARDWARE AND EXTENSION TO SACRAL ONE AND SACRAL TWO;  Surgeon: Joshua Alm RAMAN, MD;  Location: [facility] OR;  Service: Neurosurgery;  Laterality: N/A;   LUMBAR WOUND DEBRIDEMENT N/A 05/25/2020   Procedure: LUMBAR WOUND IRRIGATION AND DEBRIDEMENT;  Surgeon: Joshua Alm RAMAN, MD;   Location: [facility] OR;  Service: Neurosurgery;  Laterality: N/A;   PERIPHERAL VASCULAR INTERVENTION  08/07/2021   Procedure: PERIPHERAL VASCULAR INTERVENTION;  Surgeon: Sheree Penne Bruckner, MD;  Location: [facility] INVASIVE CV LAB;  Service: Cardiovascular;;  Rt SFA   PERIPHERAL VASCULAR INTERVENTION Right 10/23/2021   Procedure: PERIPHERAL VASCULAR INTERVENTION;  Surgeon: Sheree Penne Bruckner, MD;  Location: [facility] INVASIVE CV LAB;  Service: Cardiovascular;  Laterality: Right;  SFA   POSTERIOR LUMBAR FUSION 4 LEVEL N/A 04/25/2020   Procedure: POSTERIOR LUMBAR INTERBODY FUSION LUMBAR ONE-TWO, LUMBAR TWO-THREE, LUMBAR THREE-FOUR,LUMBAR FOUR-FIVE.;  Surgeon: Joshua Alm RAMAN, MD;  Location: [facility] OR;  Service: Neurosurgery;  Laterality: N/A;  posterior   SHOULDER ARTHROSCOPY Left 05/08/2022   Procedure: ARTHROSCOPY SHOULDER;  Surgeon: Beverley Evalene JONETTA, MD;  Location: [facility] ORS;  Service: Orthopedics;  Laterality: Left;   TONSILLECTOMY     TOTAL ABDOMINAL HYSTERECTOMY  W/ BILATERAL SALPINGOOPHORECTOMY Bilateral 2000   TOTAL HIP ARTHROPLASTY Right 08/22/2018   Procedure: TOTAL HIP ARTHROPLASTY;  Surgeon: Shari Sieving, MD;  Location: [facility] ORS;  Service: Orthopedics;  Laterality: Right;   TOTAL HIP ARTHROPLASTY Left 07/16/2023   Procedure: ARTHROPLASTY, HIP, TOTAL, ANTERIOR APPROACH;  Surgeon: Beverley Evalene BIRCH, MD;  Location: [facility] ORS;  Service: Orthopedics;  Laterality: Left;   TRANSCAROTID ARTERY REVASCULARIZATION  Left 08/07/2022   Procedure: Transcarotid Artery Revascularization;  Surgeon: Sheree Penne Bruckner, MD;  Location: [facility] OR;  Service: Vascular;  Laterality: Left;   ULTRASOUND GUIDANCE FOR VASCULAR ACCESS Left 08/07/2022   Procedure: ULTRASOUND GUIDANCE FOR VASCULAR ACCESS;  Surgeon: Sheree Penne Bruckner, MD;  Location: [facility] OR;  Service: Vascular;  Laterality: Left;   VARICOSE VEIN SURGERY  2008   stripping       Review of Systems  Constitutional:  Negative for chills, diaphoresis, fatigue  and fever.  Respiratory:  Negative for cough, chest tightness, shortness of breath and wheezing.   Cardiovascular:  Negative for chest pain.  Gastrointestinal:  Negative for abdominal pain, diarrhea, nausea and vomiting.    Objective:   BP (!) 167/80   Pulse 99   Temp (!) 97.4 F (36.3 C) (Oral)   Wt 113 lb (51.3 kg)   SpO2 100%   BMI 20.02 kg/m  Nursing note and vital signs reviewed.  Physical Exam Constitutional:      General: She is not in acute distress.    Appearance: She is well-developed.  Cardiovascular:     Rate and Rhythm: Normal rate and regular rhythm.     Heart sounds: Normal heart sounds.  Pulmonary:     Effort: Pulmonary effort is normal.     Breath sounds: Normal breath sounds.  Skin:    General: Skin is warm and dry.  Neurological:     Mental Status: She is alert and oriented to person, place, and time.         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   S/P total left hip arthroplasty 07/16/2023   Asymptomatic carotid artery stenosis without infarction, left 08/07/2022   Observation after surgery 08/07/2022   Septic arthritis of shoulder (HCC) 05/07/2022   Anemia of chronic disease 04/10/2022   Lumbar pseudoarthrosis 01/24/2022   Occlusion and stenosis of right carotid artery 01/24/2022   Iron  deficiency anemia secondary to blood loss (chronic) 01/08/2022   B12 deficiency 08/31/2021   Medication monitoring encounter 12/12/2020   Nausea 12/12/2020   Hammer toes of both feet 11/09/2020   Acute maxillary sinusitis 08/08/2020   Hyperlipidemia  08/08/2020   Pain due to onychomycosis of toenails of both feet 08/08/2020   PICC (peripherally inserted central catheter) in place 06/22/2020   Therapeutic drug monitoring 06/22/2020   Wound infection after surgery 05/25/2020   Wound drainage 05/24/2020   Local infection of the skin and subcutaneous tissue, unspecified 05/24/2020   Body mass index (BMI) 27.0-27.9, adult 05/10/2020   S/P lumbar  fusion 04/25/2020   Microalbuminuria due to type 2 diabetes mellitus (HCC) 08/07/2019   Primary localized osteoarthritis of right hip 08/22/2018   Carpal tunnel syndrome, bilateral 08/06/2018   Type 2 diabetes mellitus with peripheral neuropathy (HCC) 08/06/2018   Degenerative joint disease of right hip 08/06/2018   Preoperative clearance 03/26/2018   Upper back pain on left side 10/30/2016   Encounter for postoperative carotid endarterectomy surveillance 12/07/2015   Varicosities of leg 06/03/2015   Acute left lumbar radiculopathy 04/15/2014   Chronic sciatica of right side 03/17/2013   Diabetic peripheral neuropathy (HCC) 03/17/2013   Coronary artery disease    Carotid artery stenosis s/p CEA 2013 10/16/2011   Carotid artery occlusion without infarction, right 09/17/2011   Carotid bruit 08/28/2011   Gout 04/13/2011   DM type 2 with diabetic peripheral neuropathy (HCC) 04/02/2008   Hyperlipidemia associated with type 2 diabetes mellitus (HCC) 04/02/2008   Essential hypertension 04/02/2008   DEGENERATIVE DISC DISEASE, LUMBOSACRAL SPINE 04/02/2008     Problem List Items Addressed This Visit       Musculoskeletal and Integument   Psoas abscess, right (HCC) - Primary   Completed course of Daptomycin  and s/p percutaneous drain removal. Discussed plan of care and need for continued suppression with presence of hardware and no new neurosurgical intervention planned. Tedazolid was declined by insurance and will start Bactrim  800-160 mg PO bid for suppression as doxycycline is resistant.  Encouraged yogurt as needed for probiotic and can use fluconzaole 150 mg in event of candida infection. Will need therapeutic drug monitoring of renal function and will plan to check in 1 month or sooner if needed.         Other   PICC (peripherally inserted central catheter) in place   IV treatment completed and PICC line removed without complication.      Hardware complicating wound infection (HCC)   Relevant Medications   sulfamethoxazole -trimethoprim  (BACTRIM  DS) 800-160 MG tablet     I am having April Shaffer start on sulfamethoxazole -trimethoprim . I am also having her maintain her glucose blood, clopidogrel , ezetimibe , oxyCODONE -acetaminophen , gabapentin , acetaminophen , hydrochlorothiazide , carvedilol , ondansetron , metFORMIN , amLODipine , cyanocobalamin , aspirin  EC, ondansetron , sennosides-docusate sodium , and sodium chloride  flush.   Meds ordered this encounter  Medications   sulfamethoxazole -trimethoprim  (BACTRIM  DS) 800-160 MG tablet    Sig: Take 1 tablet by mouth 2 (two) times daily.    Dispense:  60 tablet    Refill:  5    Supervising Provider:   LUIZ CHANNEL [[id number]]     Follow-up: Return in about 1 month (around 11/28/2023). or sooner if needed.   Cathlyn July, MSN, FNP-C Nurse Practitioner [facility] Main number: [phone]

## 2023-10-30 ENCOUNTER — Encounter: Payer: Self-pay | Admitting: Vascular Surgery

## 2023-10-30 ENCOUNTER — Ambulatory Visit: Attending: Vascular Surgery | Admitting: Vascular Surgery

## 2023-10-30 ENCOUNTER — Other Ambulatory Visit: Payer: Self-pay

## 2023-10-30 VITALS — BP 129/79 | HR 103 | Temp 97.8°F

## 2023-10-30 DIAGNOSIS — I70221 Atherosclerosis of native arteries of extremities with rest pain, right leg: Secondary | ICD-10-CM

## 2023-10-30 NOTE — Progress Notes (Signed)
 Patient ID: April Shaffer, female   DOB: October 12, 1950, 73 y.o.   MRN: [id number]  Reason for Consult: Follow-up   Referred by Claudene Prentice DELENA Mickey., FNP  Subjective:     HPI:  April Shaffer is a 73 y.o. female has a history of right SFA stenting for rest pain and subsequent reintervention most recently in 2023.  She also has remote history of right carotid endarterectomy and left TCAR.  Recently she was admitted with right psoas abscess now maintained on p.o. antibiotics.  At that time Plavix  was held.  She now has rest pain in the right lower extremity which has recurred and similar to previous intervention pain.  This occurs at night and with any amount of walking she does calf cramping on the right.  She denies any symptoms on the left.  She is back on aspirin  and Plavix  at this time.  She does continue to smoke although states that she has decreased the amount.  She has also lost significant weight since her most recent illness.  Past Medical History:  Diagnosis Date   Anemia    Arthritis    Blood transfusion without reported diagnosis    Carotid artery narrowing    Coronary artery disease    Cardiac catheterization November 2013: 50% ostial LAD stenosis 50% mid stenosis. 30% disease in the left circumflex.   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   Sleep apnea       Had surgery to correct   Thyroid  nodule    Family History  Problem Relation Age of Onset   Hypertension Sister    Hypothyroidism Sister    Cancer Maternal Grandmother        lung   Hypertension Son    Hypertension Sister    Diabetes Brother        lost toe in 2018 with new diagnosis of DM   Hypertension Son    Past Surgical History:  Procedure Laterality Date   ABDOMINAL AORTOGRAM W/LOWER EXTREMITY N/A 08/07/2021   Procedure: ABDOMINAL AORTOGRAM W/LOWER  EXTREMITY;  Surgeon: Sheree Penne Bruckner, MD;  Location: [facility] INVASIVE CV LAB;  Service: Cardiovascular;  Laterality: N/A;   ABDOMINAL AORTOGRAM W/LOWER EXTREMITY Right 10/23/2021   Procedure: ABDOMINAL AORTOGRAM W/LOWER EXTREMITY;  Surgeon: Sheree Penne Bruckner, MD;  Location: [facility] INVASIVE CV LAB;  Service: Cardiovascular;  Laterality: Right;   ABDOMINAL HYSTERECTOMY     APPLICATION OF INTRAOPERATIVE CT SCAN N/A 11/30/2020   Procedure: APPLICATION OF INTRAOPERATIVE CT SCAN;  Surgeon: Joshua Alm RAMAN, MD;  Location: [facility] OR;  Service: Neurosurgery;  Laterality: N/A;   BACK SURGERY     CARDIAC CATHETERIZATION     CATARACT EXTRACTION, BILATERAL Bilateral 2021   Dr. Milissa   ENDARTERECTOMY  09/27/2011   Procedure: RIGT ENDARTERECTOMY CAROTID;  Surgeon: Lynwood JONETTA Collum, MD;  Location: [facility] OR;  Service: Vascular;  Laterality: Right;   EPIDURAL BLOCK INJECTION  02/2008   Drs. Clydell Mince   EYE SURGERY Bilateral 2021   cataract   gyn surgery  2004   total hysterectomy for mennorhagia,,salpingoophorectomy   INCISION AND DRAINAGE ABSCESS Left 05/08/2022   Procedure: ARTHROSCOPIC INCISION AND DRAINAGE ABSCESS, DISTAL CLAVICLE EXCISSION, SUBACROMIAL DECOMPRESSION, LOOSE BODY REMOVAL, EXTENSIVE DEBRIDEMENT;  Surgeon: Beverley Evalene JONETTA, MD;  Location: [facility] ORS;  Service: Orthopedics;  Laterality: Left;  IR RADIOLOGIST EVAL & MGMT  10/09/2023   LAMINECTOMY WITH POSTERIOR LATERAL ARTHRODESIS LEVEL 2 N/A 11/30/2020   Procedure: LUMBAR FOUR-FIVE, LUMBAR FIVE-SACRAL ONE POSTERIOR LATERAL FUSION WITH REVISION OF LUMBAR ONE-FIVE HARDWARE AND EXTENSION TO SACRAL ONE AND SACRAL TWO;  Surgeon: Joshua Alm RAMAN, MD;  Location: [facility] OR;  Service: Neurosurgery;  Laterality: N/A;   LUMBAR WOUND DEBRIDEMENT N/A 05/25/2020   Procedure: LUMBAR WOUND IRRIGATION AND DEBRIDEMENT;  Surgeon: Joshua Alm RAMAN, MD;  Location: [facility] OR;  Service: Neurosurgery;  Laterality: N/A;   PERIPHERAL VASCULAR INTERVENTION  08/07/2021    Procedure: PERIPHERAL VASCULAR INTERVENTION;  Surgeon: Sheree Penne Bruckner, MD;  Location: [facility] INVASIVE CV LAB;  Service: Cardiovascular;;  Rt SFA   PERIPHERAL VASCULAR INTERVENTION Right 10/23/2021   Procedure: PERIPHERAL VASCULAR INTERVENTION;  Surgeon: Sheree Penne Bruckner, MD;  Location: [facility] INVASIVE CV LAB;  Service: Cardiovascular;  Laterality: Right;  SFA   POSTERIOR LUMBAR FUSION 4 LEVEL N/A 04/25/2020   Procedure: POSTERIOR LUMBAR INTERBODY FUSION LUMBAR ONE-TWO, LUMBAR TWO-THREE, LUMBAR THREE-FOUR,LUMBAR FOUR-FIVE.;  Surgeon: Joshua Alm RAMAN, MD;  Location: [facility] OR;  Service: Neurosurgery;  Laterality: N/A;  posterior   SHOULDER ARTHROSCOPY Left 05/08/2022   Procedure: ARTHROSCOPY SHOULDER;  Surgeon: Beverley Evalene BIRCH, MD;  Location: [facility] ORS;  Service: Orthopedics;  Laterality: Left;   TONSILLECTOMY     TOTAL ABDOMINAL HYSTERECTOMY W/ BILATERAL SALPINGOOPHORECTOMY Bilateral 2000   TOTAL HIP ARTHROPLASTY Right 08/22/2018   Procedure: TOTAL HIP ARTHROPLASTY;  Surgeon: Shari Sieving, MD;  Location: [facility] ORS;  Service: Orthopedics;  Laterality: Right;   TOTAL HIP ARTHROPLASTY Left 07/16/2023   Procedure: ARTHROPLASTY, HIP, TOTAL, ANTERIOR APPROACH;  Surgeon: Beverley Evalene BIRCH, MD;  Location: [facility] ORS;  Service: Orthopedics;  Laterality: Left;   TRANSCAROTID ARTERY REVASCULARIZATION  Left 08/07/2022   Procedure: Transcarotid Artery Revascularization;  Surgeon: Sheree Penne Bruckner, MD;  Location: [facility] OR;  Service: Vascular;  Laterality: Left;   ULTRASOUND GUIDANCE FOR VASCULAR ACCESS Left 08/07/2022   Procedure: ULTRASOUND GUIDANCE FOR VASCULAR ACCESS;  Surgeon: Sheree Penne Bruckner, MD;  Location: [facility] OR;  Service: Vascular;  Laterality: Left;   VARICOSE VEIN SURGERY  2008   stripping    Short Social History:  Social History   Tobacco Use   Smoking status: Every Day    Current packs/day: 0.25    Average packs/day: 0.3 packs/day for 55.4 years (13.9 ttl pk-yrs)    Types:  Cigarettes    Start date: 05/31/1968    Passive exposure: Never   Smokeless tobacco: Never   Tobacco comments:    Smoked x50 years, 1 pack would last 3 days. Currently smoking 4-5 cigarettes per day  Substance Use Topics   Alcohol use: Not Currently    Alcohol/week: 14.0 standard drinks of alcohol    Types: 14 Standard drinks or equivalent per week    Comment: 1-2 drinks every other day    Allergies  Allergen Reactions   Zestoretic  [Lisinopril -Hydrochlorothiazide ] Swelling and Other (See Comments)    Angioedema + Tongue swelling    Codeine Itching   Oxycodone  Itching and Other (See Comments)    Patient is taking in 2025   Ultram  [Tramadol ] Itching   Zocor  [Simvastatin ] Other (See Comments)    Muscle pain    Current Outpatient Medications  Medication Sig Dispense Refill   acetaminophen  (TYLENOL ) 500 MG tablet Take 500 mg by mouth every 6 (six) hours as needed for mild pain (pain score 1-3), moderate pain (pain score 4-6) or headache.     amLODipine  (  NORVASC ) 10 MG tablet Take 10 mg by mouth in the morning.     aspirin  EC 81 MG tablet Take 1 tablet (81 mg total) by mouth 2 (two) times daily. To prevent blood clots for 30 days after surgery. 60 tablet 0   carvedilol  (COREG  CR) 10 MG 24 hr capsule Take 10 mg by mouth in the morning.     clopidogrel  (PLAVIX ) 75 MG tablet Take 75 mg by mouth in the morning.     cyanocobalamin  (VITAMIN B12) 1000 MCG/ML injection Inject 1,000 mcg into the muscle every 30 (thirty) days.     ezetimibe  (ZETIA ) 10 MG tablet TAKE 1 TABLET EVERY DAY (NEED MD APPOINTMENT) (Patient taking differently: Take 10 mg by mouth daily.) 90 tablet 0   gabapentin  (NEURONTIN ) 300 MG capsule Take 300 mg by mouth in the morning, at noon, and at bedtime.     glucose blood test strip Check sugars twice daily 100 each 11   hydrochlorothiazide  (HYDRODIURIL ) 12.5 MG tablet Take 12.5 mg by mouth in the morning.     metFORMIN  (GLUCOPHAGE -XR) 750 MG 24 hr tablet Take 750 mg by mouth  in the morning.     ondansetron  (ZOFRAN ) 4 MG tablet Take 4 mg by mouth every 8 (eight) hours as needed for nausea or vomiting.     ondansetron  (ZOFRAN -ODT) 4 MG disintegrating tablet Take 1 tablet (4 mg total) by mouth every 8 (eight) hours as needed for nausea or vomiting. (Patient taking differently: Take 4 mg by mouth every 8 (eight) hours as needed for nausea or vomiting (DISSOLVE ORALLY).) 15 tablet 0   oxyCODONE -acetaminophen  (PERCOCET) 10-325 MG tablet Take 1 tablet by mouth every 4 (four) hours as needed for pain. (Patient taking differently: Take 1 tablet by mouth See admin instructions. Take 1 tablet by mouth every 6-8 hours as needed for pain) 40 tablet 0   sennosides-docusate sodium  (SENOKOT-S) 8.6-50 MG tablet Take 2 tablets by mouth daily as needed for constipation (while taking narcotics). 30 tablet 1   sodium chloride  flush 0.9 % SOLN injection Flush with 5-10mL of saline daily to prevent clogging 200 mL 0   sulfamethoxazole -trimethoprim  (BACTRIM  DS) 800-160 MG tablet Take 1 tablet by mouth 2 (two) times daily. 60 tablet 5   No current facility-administered medications for this visit.    Review of Systems  Constitutional: Positive for unexpected weight change.  HENT: HENT negative.  Eyes: Eyes negative.  Cardiovascular: Positive for claudication.  GI: Gastrointestinal negative.  Musculoskeletal: Positive for leg pain.  Skin: Skin negative.  Neurological: Neurological negative. Hematologic: Hematologic/lymphatic negative.  Psychiatric: Psychiatric negative.        Objective:  Objective   Vitals:   10/30/23 1055  BP: 129/79  Pulse: (!) 103  Temp: 97.8 F (36.6 C)   There is no height or weight on file to calculate BMI.  Physical Exam Constitutional:      Comments: Appears underweight  HENT:     Head: Normocephalic.  Cardiovascular:     Pulses:          Femoral pulses are 1+ on the right side and 2+ on the left side.      Popliteal pulses are 0 on the right  side.  Pulmonary:     Effort: Pulmonary effort is normal.  Abdominal:     General: Abdomen is flat.  Musculoskeletal:     Right lower leg: No edema.     Left lower leg: No edema.  Skin:    General: Skin  is warm.  Neurological:     General: No focal deficit present.     Mental Status: She is alert.  Psychiatric:        Mood and Affect: Mood normal.     Data: ABI Findings:  +---------+------------------+-----+----------+--------+  Right   Rt Pressure (mmHg)IndexWaveform  Comment   +---------+------------------+-----+----------+--------+  ATA     90                0.53 monophasic          +---------+------------------+-----+----------+--------+  PTA     81                0.47 monophasic          +---------+------------------+-----+----------+--------+  Great Toe0                 0.00                     +---------+------------------+-----+----------+--------+   +---------+------------------+-----+----------+-------+  Left    Lt Pressure (mmHg)IndexWaveform  Comment  +---------+------------------+-----+----------+-------+  Brachial 171                                       +---------+------------------+-----+----------+-------+  ATA     187               1.09 monophasic         +---------+------------------+-----+----------+-------+  PTA     255               1.49 monophasic         +---------+------------------+-----+----------+-------+  Great Toe104               0.61                    +---------+------------------+-----+----------+-------+   +-------+-----------+-----------+------------+------------+  ABI/TBIToday's ABIToday's TBIPrevious ABIPrevious TBI  +-------+-----------+-----------+------------+------------+  Right 0.53       0          0.8         0.57          +-------+-----------+-----------+------------+------------+  Left  [place]         0.61       1.1         0.59           +-------+-----------+-----------+------------+------------+        Previous ABI on 05/15/23.    Summary:  Right: Resting right ankle-brachial index indicates moderate right lower  extremity arterial disease. The right toe-brachial index is abnormal.    Left: Resting left ankle-brachial index indicates noncompressible left  lower extremity arteries. The left toe-brachial index is abnormal.    LOWER EXTREMITY ARTERIAL DUPLEX STUDY   Patient Name:  April Shaffer  Date of Exam:   10/24/2023  Medical Rec #: [id number]         Accession #:    [id number]  Date of Birth: Jun 24, 1950          Patient Gender: F  Patient Age:   45 years  Exam Location:  [address]  Procedure:      VAS US  LOWER EXTREMITY ARTERIAL DUPLEX  Referring Phys: PENNE COLORADO    ---------------------------------------------------------------------------  -----    Indications: Triage note 10/23/23: Pt called reporting no feeling in toes  on              right foot and pain  in back of her calf.     Vascular Interventions: 08/07/21: Right distal SFA stent.                          10/23/21: Right SFA stent cephalad to previous  stent.  Current ABI:            Right 0.53/0 Left [place]/0.61   Comparison Study: 05/15/23: Right: Patent proximal and distal SFA stenting.                    Mid SFA velocities suggest a 50-74% stenosis.                    Popliteal disease visualized with velocities suggesting  <50%                   stenosis.   Performing Technologist: [place] RVT     Examination Guidelines: A complete evaluation includes B-mode imaging,  spectral  Doppler, color Doppler, and power Doppler as needed of all accessible  portions  of each vessel. Bilateral testing is considered an integral part of a  complete  examination. Limited examinations for reoccurring indications may be  performed  as noted.       +----------+--------+-----+--------+----------+----------------------+  RIGHT     PSV cm/sRatioStenosisWaveform  Comments                +----------+--------+-----+--------+----------+----------------------+  CFA Distal112                  triphasic                         +----------+--------+-----+--------+----------+----------------------+  DFA      113                  triphasic                         +----------+--------+-----+--------+----------+----------------------+  SFA Prox  31                   biphasic                          +----------+--------+-----+--------+----------+----------------------+  SFA Mid   12                                                     +----------+--------+-----+--------+----------+----------------------+  SFA Distal                               stent appears occluded  +----------+--------+-----+--------+----------+----------------------+  POP Prox  76                   monophasic                        +----------+--------+-----+--------+----------+----------------------+  POP Distal71                   monophasic                        +----------+--------+-----+--------+----------+----------------------+  TP Trunk  36  monophasic                        +----------+--------+-----+--------+----------+----------------------+     Right Stent(s):  +---------------+--------+--------+----------+--------+  prox SFA       PSV cm/sStenosisWaveform  Comments  +---------------+--------+--------+----------+--------+  Prox to Stent  23              monophasic          +---------------+--------+--------+----------+--------+  Proximal Stent 26              monophasic          +---------------+--------+--------+----------+--------+  Mid Stent      15              monophasic          +---------------+--------+--------+----------+--------+  Distal Stent   18              monophasic           +---------------+--------+--------+----------+--------+  Distal to Stent24              monophasic          +---------------+--------+--------+----------+--------+   Left Stent(s):  +----------+--------+--------+--------+----------------------+  distal SFAPSV cm/sStenosisWaveformComments                +----------+--------+--------+--------+----------------------+  Mid Stent                         Stent appears occluded  +----------+--------+--------+--------+----------------------+     Summary:  Right: Proximal SFA stent appears patent with no stenosis.  The distal stent appears occluded with reconstitution in the poplieal  artery.      Assessment/Plan:     73 year old female with recurrent right lower extremity rest pain with apparent occlusion of distal right SFA stent.  This was likely secondary to recent illness with holding antiplatelet agents but also secondary to her smoking which we discussed today.  Plan will be for angiography from the left common femoral approach to evaluate possible endovascular revascularization.  We discussed possible need for bypass given her history of multiple previous interventions.  We also discussed the need for smoking cessation.  She demonstrates understanding we will get her scheduled for Monday in the near future.  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 The patient is on best medical therapy for peripheral arterial disease. The patient has been counseled about the risks of tobacco use in atherosclerotic disease. The patient has been counseled to abstain from any tobacco use. An aortogram with bilateral lower extremity runoff angiography and Right lower extremity intervention and is indicated to better evaluate the patient's lower extremity circulation because of the limb threatening nature of the patient's diagnosis. Based on the patient's clinical exam  and non-invasive data, we anticipate an endovascular intervention in the femoropopliteal vessels. Stenting and/or athrectomy would be favored because of the improved primary patency of these interventions as compared to plain balloon angioplasty.     Penne Lonni Colorado MD [facility]

## 2023-10-30 NOTE — H&P (View-Only) (Signed)
 Patient ID: April Shaffer, female   DOB: October 12, 1950, 73 y.o.   MRN: [id number]  Reason for Consult: Follow-up   Referred by Claudene Prentice DELENA Mickey., FNP  Subjective:     HPI:  April Shaffer is a 73 y.o. female has a history of right SFA stenting for rest pain and subsequent reintervention most recently in 2023.  She also has remote history of right carotid endarterectomy and left TCAR.  Recently she was admitted with right psoas abscess now maintained on p.o. antibiotics.  At that time Plavix  was held.  She now has rest pain in the right lower extremity which has recurred and similar to previous intervention pain.  This occurs at night and with any amount of walking she does calf cramping on the right.  She denies any symptoms on the left.  She is back on aspirin  and Plavix  at this time.  She does continue to smoke although states that she has decreased the amount.  She has also lost significant weight since her most recent illness.  Past Medical History:  Diagnosis Date   Anemia    Arthritis    Blood transfusion without reported diagnosis    Carotid artery narrowing    Coronary artery disease    Cardiac catheterization November 2013: 50% ostial LAD stenosis 50% mid stenosis. 30% disease in the left circumflex.   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   Sleep apnea       Had surgery to correct   Thyroid  nodule    Family History  Problem Relation Age of Onset   Hypertension Sister    Hypothyroidism Sister    Cancer Maternal Grandmother        lung   Hypertension Son    Hypertension Sister    Diabetes Brother        lost toe in 2018 with new diagnosis of DM   Hypertension Son    Past Surgical History:  Procedure Laterality Date   ABDOMINAL AORTOGRAM W/LOWER EXTREMITY N/A 08/07/2021   Procedure: ABDOMINAL AORTOGRAM W/LOWER  EXTREMITY;  Surgeon: Sheree Penne Bruckner, MD;  Location: [facility] INVASIVE CV LAB;  Service: Cardiovascular;  Laterality: N/A;   ABDOMINAL AORTOGRAM W/LOWER EXTREMITY Right 10/23/2021   Procedure: ABDOMINAL AORTOGRAM W/LOWER EXTREMITY;  Surgeon: Sheree Penne Bruckner, MD;  Location: [facility] INVASIVE CV LAB;  Service: Cardiovascular;  Laterality: Right;   ABDOMINAL HYSTERECTOMY     APPLICATION OF INTRAOPERATIVE CT SCAN N/A 11/30/2020   Procedure: APPLICATION OF INTRAOPERATIVE CT SCAN;  Surgeon: Joshua Alm RAMAN, MD;  Location: [facility] OR;  Service: Neurosurgery;  Laterality: N/A;   BACK SURGERY     CARDIAC CATHETERIZATION     CATARACT EXTRACTION, BILATERAL Bilateral 2021   Dr. Milissa   ENDARTERECTOMY  09/27/2011   Procedure: RIGT ENDARTERECTOMY CAROTID;  Surgeon: Lynwood JONETTA Collum, MD;  Location: [facility] OR;  Service: Vascular;  Laterality: Right;   EPIDURAL BLOCK INJECTION  02/2008   Drs. Clydell Mince   EYE SURGERY Bilateral 2021   cataract   gyn surgery  2004   total hysterectomy for mennorhagia,,salpingoophorectomy   INCISION AND DRAINAGE ABSCESS Left 05/08/2022   Procedure: ARTHROSCOPIC INCISION AND DRAINAGE ABSCESS, DISTAL CLAVICLE EXCISSION, SUBACROMIAL DECOMPRESSION, LOOSE BODY REMOVAL, EXTENSIVE DEBRIDEMENT;  Surgeon: Beverley Evalene JONETTA, MD;  Location: [facility] ORS;  Service: Orthopedics;  Laterality: Left;  IR RADIOLOGIST EVAL & MGMT  10/09/2023   LAMINECTOMY WITH POSTERIOR LATERAL ARTHRODESIS LEVEL 2 N/A 11/30/2020   Procedure: LUMBAR FOUR-FIVE, LUMBAR FIVE-SACRAL ONE POSTERIOR LATERAL FUSION WITH REVISION OF LUMBAR ONE-FIVE HARDWARE AND EXTENSION TO SACRAL ONE AND SACRAL TWO;  Surgeon: Joshua Alm RAMAN, MD;  Location: [facility] OR;  Service: Neurosurgery;  Laterality: N/A;   LUMBAR WOUND DEBRIDEMENT N/A 05/25/2020   Procedure: LUMBAR WOUND IRRIGATION AND DEBRIDEMENT;  Surgeon: Joshua Alm RAMAN, MD;  Location: [facility] OR;  Service: Neurosurgery;  Laterality: N/A;   PERIPHERAL VASCULAR INTERVENTION  08/07/2021    Procedure: PERIPHERAL VASCULAR INTERVENTION;  Surgeon: Sheree Penne Bruckner, MD;  Location: [facility] INVASIVE CV LAB;  Service: Cardiovascular;;  Rt SFA   PERIPHERAL VASCULAR INTERVENTION Right 10/23/2021   Procedure: PERIPHERAL VASCULAR INTERVENTION;  Surgeon: Sheree Penne Bruckner, MD;  Location: [facility] INVASIVE CV LAB;  Service: Cardiovascular;  Laterality: Right;  SFA   POSTERIOR LUMBAR FUSION 4 LEVEL N/A 04/25/2020   Procedure: POSTERIOR LUMBAR INTERBODY FUSION LUMBAR ONE-TWO, LUMBAR TWO-THREE, LUMBAR THREE-FOUR,LUMBAR FOUR-FIVE.;  Surgeon: Joshua Alm RAMAN, MD;  Location: [facility] OR;  Service: Neurosurgery;  Laterality: N/A;  posterior   SHOULDER ARTHROSCOPY Left 05/08/2022   Procedure: ARTHROSCOPY SHOULDER;  Surgeon: Beverley Evalene BIRCH, MD;  Location: [facility] ORS;  Service: Orthopedics;  Laterality: Left;   TONSILLECTOMY     TOTAL ABDOMINAL HYSTERECTOMY W/ BILATERAL SALPINGOOPHORECTOMY Bilateral 2000   TOTAL HIP ARTHROPLASTY Right 08/22/2018   Procedure: TOTAL HIP ARTHROPLASTY;  Surgeon: Shari Sieving, MD;  Location: [facility] ORS;  Service: Orthopedics;  Laterality: Right;   TOTAL HIP ARTHROPLASTY Left 07/16/2023   Procedure: ARTHROPLASTY, HIP, TOTAL, ANTERIOR APPROACH;  Surgeon: Beverley Evalene BIRCH, MD;  Location: [facility] ORS;  Service: Orthopedics;  Laterality: Left;   TRANSCAROTID ARTERY REVASCULARIZATION  Left 08/07/2022   Procedure: Transcarotid Artery Revascularization;  Surgeon: Sheree Penne Bruckner, MD;  Location: [facility] OR;  Service: Vascular;  Laterality: Left;   ULTRASOUND GUIDANCE FOR VASCULAR ACCESS Left 08/07/2022   Procedure: ULTRASOUND GUIDANCE FOR VASCULAR ACCESS;  Surgeon: Sheree Penne Bruckner, MD;  Location: [facility] OR;  Service: Vascular;  Laterality: Left;   VARICOSE VEIN SURGERY  2008   stripping    Short Social History:  Social History   Tobacco Use   Smoking status: Every Day    Current packs/day: 0.25    Average packs/day: 0.3 packs/day for 55.4 years (13.9 ttl pk-yrs)    Types:  Cigarettes    Start date: 05/31/1968    Passive exposure: Never   Smokeless tobacco: Never   Tobacco comments:    Smoked x50 years, 1 pack would last 3 days. Currently smoking 4-5 cigarettes per day  Substance Use Topics   Alcohol use: Not Currently    Alcohol/week: 14.0 standard drinks of alcohol    Types: 14 Standard drinks or equivalent per week    Comment: 1-2 drinks every other day    Allergies  Allergen Reactions   Zestoretic  [Lisinopril -Hydrochlorothiazide ] Swelling and Other (See Comments)    Angioedema + Tongue swelling    Codeine Itching   Oxycodone  Itching and Other (See Comments)    Patient is taking in 2025   Ultram  [Tramadol ] Itching   Zocor  [Simvastatin ] Other (See Comments)    Muscle pain    Current Outpatient Medications  Medication Sig Dispense Refill   acetaminophen  (TYLENOL ) 500 MG tablet Take 500 mg by mouth every 6 (six) hours as needed for mild pain (pain score 1-3), moderate pain (pain score 4-6) or headache.     amLODipine  (  NORVASC ) 10 MG tablet Take 10 mg by mouth in the morning.     aspirin  EC 81 MG tablet Take 1 tablet (81 mg total) by mouth 2 (two) times daily. To prevent blood clots for 30 days after surgery. 60 tablet 0   carvedilol  (COREG  CR) 10 MG 24 hr capsule Take 10 mg by mouth in the morning.     clopidogrel  (PLAVIX ) 75 MG tablet Take 75 mg by mouth in the morning.     cyanocobalamin  (VITAMIN B12) 1000 MCG/ML injection Inject 1,000 mcg into the muscle every 30 (thirty) days.     ezetimibe  (ZETIA ) 10 MG tablet TAKE 1 TABLET EVERY DAY (NEED MD APPOINTMENT) (Patient taking differently: Take 10 mg by mouth daily.) 90 tablet 0   gabapentin  (NEURONTIN ) 300 MG capsule Take 300 mg by mouth in the morning, at noon, and at bedtime.     glucose blood test strip Check sugars twice daily 100 each 11   hydrochlorothiazide  (HYDRODIURIL ) 12.5 MG tablet Take 12.5 mg by mouth in the morning.     metFORMIN  (GLUCOPHAGE -XR) 750 MG 24 hr tablet Take 750 mg by mouth  in the morning.     ondansetron  (ZOFRAN ) 4 MG tablet Take 4 mg by mouth every 8 (eight) hours as needed for nausea or vomiting.     ondansetron  (ZOFRAN -ODT) 4 MG disintegrating tablet Take 1 tablet (4 mg total) by mouth every 8 (eight) hours as needed for nausea or vomiting. (Patient taking differently: Take 4 mg by mouth every 8 (eight) hours as needed for nausea or vomiting (DISSOLVE ORALLY).) 15 tablet 0   oxyCODONE -acetaminophen  (PERCOCET) 10-325 MG tablet Take 1 tablet by mouth every 4 (four) hours as needed for pain. (Patient taking differently: Take 1 tablet by mouth See admin instructions. Take 1 tablet by mouth every 6-8 hours as needed for pain) 40 tablet 0   sennosides-docusate sodium  (SENOKOT-S) 8.6-50 MG tablet Take 2 tablets by mouth daily as needed for constipation (while taking narcotics). 30 tablet 1   sodium chloride  flush 0.9 % SOLN injection Flush with 5-10mL of saline daily to prevent clogging 200 mL 0   sulfamethoxazole -trimethoprim  (BACTRIM  DS) 800-160 MG tablet Take 1 tablet by mouth 2 (two) times daily. 60 tablet 5   No current facility-administered medications for this visit.    Review of Systems  Constitutional: Positive for unexpected weight change.  HENT: HENT negative.  Eyes: Eyes negative.  Cardiovascular: Positive for claudication.  GI: Gastrointestinal negative.  Musculoskeletal: Positive for leg pain.  Skin: Skin negative.  Neurological: Neurological negative. Hematologic: Hematologic/lymphatic negative.  Psychiatric: Psychiatric negative.        Objective:  Objective   Vitals:   10/30/23 1055  BP: 129/79  Pulse: (!) 103  Temp: 97.8 F (36.6 C)   There is no height or weight on file to calculate BMI.  Physical Exam Constitutional:      Comments: Appears underweight  HENT:     Head: Normocephalic.  Cardiovascular:     Pulses:          Femoral pulses are 1+ on the right side and 2+ on the left side.      Popliteal pulses are 0 on the right  side.  Pulmonary:     Effort: Pulmonary effort is normal.  Abdominal:     General: Abdomen is flat.  Musculoskeletal:     Right lower leg: No edema.     Left lower leg: No edema.  Skin:    General: Skin  is warm.  Neurological:     General: No focal deficit present.     Mental Status: She is alert.  Psychiatric:        Mood and Affect: Mood normal.     Data: ABI Findings:  +---------+------------------+-----+----------+--------+  Right   Rt Pressure (mmHg)IndexWaveform  Comment   +---------+------------------+-----+----------+--------+  ATA     90                0.53 monophasic          +---------+------------------+-----+----------+--------+  PTA     81                0.47 monophasic          +---------+------------------+-----+----------+--------+  Great Toe0                 0.00                     +---------+------------------+-----+----------+--------+   +---------+------------------+-----+----------+-------+  Left    Lt Pressure (mmHg)IndexWaveform  Comment  +---------+------------------+-----+----------+-------+  Brachial 171                                       +---------+------------------+-----+----------+-------+  ATA     187               1.09 monophasic         +---------+------------------+-----+----------+-------+  PTA     255               1.49 monophasic         +---------+------------------+-----+----------+-------+  Great Toe104               0.61                    +---------+------------------+-----+----------+-------+   +-------+-----------+-----------+------------+------------+  ABI/TBIToday's ABIToday's TBIPrevious ABIPrevious TBI  +-------+-----------+-----------+------------+------------+  Right 0.53       0          0.8         0.57          +-------+-----------+-----------+------------+------------+  Left  [place]         0.61       1.1         0.59           +-------+-----------+-----------+------------+------------+        Previous ABI on 05/15/23.    Summary:  Right: Resting right ankle-brachial index indicates moderate right lower  extremity arterial disease. The right toe-brachial index is abnormal.    Left: Resting left ankle-brachial index indicates noncompressible left  lower extremity arteries. The left toe-brachial index is abnormal.    LOWER EXTREMITY ARTERIAL DUPLEX STUDY   Patient Name:  April Shaffer  Date of Exam:   10/24/2023  Medical Rec #: [id number]         Accession #:    [id number]  Date of Birth: Jun 24, 1950          Patient Gender: F  Patient Age:   45 years  Exam Location:  [address]  Procedure:      VAS US  LOWER EXTREMITY ARTERIAL DUPLEX  Referring Phys: PENNE COLORADO    ---------------------------------------------------------------------------  -----    Indications: Triage note 10/23/23: Pt called reporting no feeling in toes  on              right foot and pain  in back of her calf.     Vascular Interventions: 08/07/21: Right distal SFA stent.                          10/23/21: Right SFA stent cephalad to previous  stent.  Current ABI:            Right 0.53/0 Left [place]/0.61   Comparison Study: 05/15/23: Right: Patent proximal and distal SFA stenting.                    Mid SFA velocities suggest a 50-74% stenosis.                    Popliteal disease visualized with velocities suggesting  <50%                   stenosis.   Performing Technologist: [place] RVT     Examination Guidelines: A complete evaluation includes B-mode imaging,  spectral  Doppler, color Doppler, and power Doppler as needed of all accessible  portions  of each vessel. Bilateral testing is considered an integral part of a  complete  examination. Limited examinations for reoccurring indications may be  performed  as noted.       +----------+--------+-----+--------+----------+----------------------+  RIGHT     PSV cm/sRatioStenosisWaveform  Comments                +----------+--------+-----+--------+----------+----------------------+  CFA Distal112                  triphasic                         +----------+--------+-----+--------+----------+----------------------+  DFA      113                  triphasic                         +----------+--------+-----+--------+----------+----------------------+  SFA Prox  31                   biphasic                          +----------+--------+-----+--------+----------+----------------------+  SFA Mid   12                                                     +----------+--------+-----+--------+----------+----------------------+  SFA Distal                               stent appears occluded  +----------+--------+-----+--------+----------+----------------------+  POP Prox  76                   monophasic                        +----------+--------+-----+--------+----------+----------------------+  POP Distal71                   monophasic                        +----------+--------+-----+--------+----------+----------------------+  TP Trunk  36  monophasic                        +----------+--------+-----+--------+----------+----------------------+     Right Stent(s):  +---------------+--------+--------+----------+--------+  prox SFA       PSV cm/sStenosisWaveform  Comments  +---------------+--------+--------+----------+--------+  Prox to Stent  23              monophasic          +---------------+--------+--------+----------+--------+  Proximal Stent 26              monophasic          +---------------+--------+--------+----------+--------+  Mid Stent      15              monophasic          +---------------+--------+--------+----------+--------+  Distal Stent   18              monophasic           +---------------+--------+--------+----------+--------+  Distal to Stent24              monophasic          +---------------+--------+--------+----------+--------+   Left Stent(s):  +----------+--------+--------+--------+----------------------+  distal SFAPSV cm/sStenosisWaveformComments                +----------+--------+--------+--------+----------------------+  Mid Stent                         Stent appears occluded  +----------+--------+--------+--------+----------------------+     Summary:  Right: Proximal SFA stent appears patent with no stenosis.  The distal stent appears occluded with reconstitution in the poplieal  artery.      Assessment/Plan:     73 year old female with recurrent right lower extremity rest pain with apparent occlusion of distal right SFA stent.  This was likely secondary to recent illness with holding antiplatelet agents but also secondary to her smoking which we discussed today.  Plan will be for angiography from the left common femoral approach to evaluate possible endovascular revascularization.  We discussed possible need for bypass given her history of multiple previous interventions.  We also discussed the need for smoking cessation.  She demonstrates understanding we will get her scheduled for Monday in the near future.  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 The patient is on best medical therapy for peripheral arterial disease. The patient has been counseled about the risks of tobacco use in atherosclerotic disease. The patient has been counseled to abstain from any tobacco use. An aortogram with bilateral lower extremity runoff angiography and Right lower extremity intervention and is indicated to better evaluate the patient's lower extremity circulation because of the limb threatening nature of the patient's diagnosis. Based on the patient's clinical exam  and non-invasive data, we anticipate an endovascular intervention in the femoropopliteal vessels. Stenting and/or athrectomy would be favored because of the improved primary patency of these interventions as compared to plain balloon angioplasty.     Penne Lonni [place] MD [facility]

## 2023-11-01 ENCOUNTER — Telehealth: Payer: Self-pay

## 2023-11-01 NOTE — Telephone Encounter (Signed)
 Patient called stating that Bactrim  is causing diarrhea, insomnia and no appetite. Patient started medication on Tuesday. Please advise.   Aodhan Scheidt SHAUNNA Letters, CMA

## 2023-11-01 NOTE — Telephone Encounter (Signed)
 Patient stated that she is taking abx with food. Having 2 to 3 bowel movements per day. Informed patient to keep an eye on symptoms over the weekend if not improved early next week let us  know. Patient voiced her understanding.

## 2023-11-04 ENCOUNTER — Ambulatory Visit (HOSPITAL_COMMUNITY)
Admission: RE | Admit: 2023-11-04 | Discharge: 2023-11-04 | Disposition: A | Discharge status: Home / Self Care | Attending: Vascular Surgery | Admitting: Vascular Surgery

## 2023-11-04 ENCOUNTER — Encounter (HOSPITAL_COMMUNITY)
Admission: RE | Disposition: A | Discharge status: Home / Self Care | Payer: Self-pay | Source: Home / Self Care | Attending: Vascular Surgery

## 2023-11-04 ENCOUNTER — Other Ambulatory Visit: Payer: Self-pay

## 2023-11-04 DIAGNOSIS — Z7984 Long term (current) use of oral hypoglycemic drugs: Secondary | ICD-10-CM | POA: Diagnosis not present

## 2023-11-04 DIAGNOSIS — Z7982 Long term (current) use of aspirin: Secondary | ICD-10-CM | POA: Insufficient documentation

## 2023-11-04 DIAGNOSIS — E1151 Type 2 diabetes mellitus with diabetic peripheral angiopathy without gangrene: Secondary | ICD-10-CM | POA: Insufficient documentation

## 2023-11-04 DIAGNOSIS — I70221 Atherosclerosis of native arteries of extremities with rest pain, right leg: Secondary | ICD-10-CM

## 2023-11-04 DIAGNOSIS — Y832 Surgical operation with anastomosis, bypass or graft as the cause of abnormal reaction of the patient, or of later complication, without mention of misadventure at the time of the procedure: Secondary | ICD-10-CM | POA: Diagnosis not present

## 2023-11-04 DIAGNOSIS — T82856A Stenosis of peripheral vascular stent, initial encounter: Secondary | ICD-10-CM

## 2023-11-04 DIAGNOSIS — Z7902 Long term (current) use of antithrombotics/antiplatelets: Secondary | ICD-10-CM | POA: Diagnosis not present

## 2023-11-04 DIAGNOSIS — F17210 Nicotine dependence, cigarettes, uncomplicated: Secondary | ICD-10-CM | POA: Insufficient documentation

## 2023-11-04 LAB — POCT I-STAT, CHEM 8
BUN: 21 mg/dL (ref 8–23)
Calcium, Ion: 1.22 mmol/L (ref 1.15–1.40)
Chloride: 102 mmol/L (ref 98–111)
Creatinine, Ser: 1.4 mg/dL — ABNORMAL HIGH (ref 0.44–1.00)
Glucose, Bld: 79 mg/dL (ref 70–99)
HCT: 30 % — ABNORMAL LOW (ref 36.0–46.0)
Hemoglobin: 10.2 g/dL — ABNORMAL LOW (ref 12.0–15.0)
Potassium: 3.8 mmol/L (ref 3.5–5.1)
Sodium: 137 mmol/L (ref 135–145)
TCO2: 23 mmol/L (ref 22–32)

## 2023-11-04 LAB — GLUCOSE, CAPILLARY: Glucose-Capillary: 129 mg/dL — ABNORMAL HIGH (ref 70–99)

## 2023-11-04 SURGERY — ABDOMINAL AORTOGRAM
Anesthesia: LOCAL | Laterality: Right

## 2023-11-04 MED ORDER — SODIUM CHLORIDE 0.9% FLUSH: 3.0000 mL | Freq: Two times a day (BID) | INTRAVENOUS | Status: DC

## 2023-11-04 MED ORDER — FENTANYL CITRATE (PF) 100 MCG/2ML IJ SOLN
INTRAMUSCULAR | Status: DC | PRN
  Administered 2023-11-04 (×3): 50 ug via INTRAVENOUS

## 2023-11-04 MED ORDER — MIDAZOLAM HCL 2 MG/2ML IJ SOLN
INTRAMUSCULAR | Status: AC
  Filled 2023-11-04: qty 2

## 2023-11-04 MED ORDER — MIDAZOLAM HCL 2 MG/2ML IJ SOLN
INTRAMUSCULAR | Status: DC | PRN
  Administered 2023-11-04 (×2): 1 mg via INTRAVENOUS

## 2023-11-04 MED ORDER — HYDRALAZINE HCL 20 MG/ML IJ SOLN: 5.0000 mg | INTRAMUSCULAR | Status: DC | PRN

## 2023-11-04 MED ORDER — LIDOCAINE HCL (PF) 1 % IJ SOLN
INTRAMUSCULAR | Status: DC | PRN
  Administered 2023-11-04: 10 mL via INTRADERMAL

## 2023-11-04 MED ORDER — HYDROCODONE-ACETAMINOPHEN 10-325 MG PO TABS
1.0000 | ORAL_TABLET | Freq: Once | ORAL | Status: AC
  Administered 2023-11-04: 1 via ORAL
  Filled 2023-11-04: qty 1

## 2023-11-04 MED ORDER — HEPARIN SODIUM (PORCINE) 1000 UNIT/ML IJ SOLN
INTRAMUSCULAR | Status: DC | PRN
  Administered 2023-11-04: 5000 [IU] via INTRAVENOUS

## 2023-11-04 MED ORDER — SODIUM CHLORIDE 0.9% FLUSH: 3.0000 mL | INTRAVENOUS | Status: DC | PRN

## 2023-11-04 MED ORDER — HEPARIN SODIUM (PORCINE) 1000 UNIT/ML IJ SOLN
INTRAMUSCULAR | Status: AC
  Filled 2023-11-04: qty 10

## 2023-11-04 MED ORDER — ONDANSETRON HCL 4 MG/2ML IJ SOLN: 4.0000 mg | Freq: Four times a day (QID) | INTRAMUSCULAR | Status: DC | PRN

## 2023-11-04 MED ORDER — ACETAMINOPHEN 325 MG PO TABS: 650.0000 mg | ORAL_TABLET | ORAL | Status: DC | PRN

## 2023-11-04 MED ORDER — SODIUM CHLORIDE 0.9 % IV SOLN: 250.0000 mL | INTRAVENOUS | Status: DC | PRN

## 2023-11-04 MED ORDER — SODIUM CHLORIDE 0.9 % IV SOLN: INTRAVENOUS | Status: DC

## 2023-11-04 MED ORDER — GABAPENTIN 300 MG PO CAPS
300.0000 mg | ORAL_CAPSULE | Freq: Once | ORAL | Status: AC
  Administered 2023-11-04: 300 mg via ORAL
  Filled 2023-11-04: qty 1

## 2023-11-04 MED ORDER — FENTANYL CITRATE (PF) 100 MCG/2ML IJ SOLN
INTRAMUSCULAR | Status: AC
  Filled 2023-11-04: qty 2

## 2023-11-04 MED ORDER — SODIUM CHLORIDE 0.9 % WEIGHT BASED INFUSION
1.0000 mL/kg/h | INTRAVENOUS | Status: DC
Start: 2023-11-04 — End: 2023-11-05

## 2023-11-04 MED ORDER — IODIXANOL 320 MG/ML IV SOLN
INTRAVENOUS | Status: DC | PRN
  Administered 2023-11-04: 60 mL

## 2023-11-04 MED ORDER — HEPARIN (PORCINE) IN NACL 1000-0.9 UT/500ML-% IV SOLN
INTRAVENOUS | Status: DC | PRN
  Administered 2023-11-04: 1000 mL

## 2023-11-04 MED ORDER — LIDOCAINE HCL (PF) 1 % IJ SOLN
INTRAMUSCULAR | Status: AC
  Filled 2023-11-04: qty 30

## 2023-11-04 MED ORDER — LABETALOL HCL 5 MG/ML IV SOLN: 10.0000 mg | INTRAVENOUS | Status: DC | PRN

## 2023-11-04 MED ORDER — MORPHINE SULFATE (PF) 2 MG/ML IV SOLN: 2.0000 mg | INTRAVENOUS | Status: DC | PRN

## 2023-11-04 SURGICAL SUPPLY — 24 items
BALLOON STERLING OTW 5X150X150 (BALLOONS) IMPLANT
CATH ANGIO 5F BER2 65CM (CATHETERS) IMPLANT
CATH AURYON ATHERECTOMY 2.0 (CATHETERS) IMPLANT
CATH NAVICROSS ST 65CM (CATHETERS) IMPLANT
CATH OMNI FLUSH 5F 65CM (CATHETERS) IMPLANT
CATH QUICKCROSS SUPP .035X90CM (MICROCATHETER) IMPLANT
CLOSURE MYNX CONTROL 6F/7F (Vascular Products) IMPLANT
COVER DOME SNAP 22 D (MISCELLANEOUS) IMPLANT
DCB RANGER 5.0X200 150 (BALLOONS) IMPLANT
GLIDEWIRE ADV .035X260CM (WIRE) IMPLANT
GUIDEWIRE ANGLED .035 180CM (WIRE) IMPLANT
KIT ENCORE 26 ADVANTAGE (KITS) IMPLANT
KIT MICROPUNCTURE NIT STIFF (SHEATH) IMPLANT
KIT PV (KITS) ×3 IMPLANT
KIT SINGLE USE MANIFOLD (KITS) IMPLANT
SET ATX-X65L (MISCELLANEOUS) IMPLANT
SHEATH CATAPULT 6F 45 MP (SHEATH) IMPLANT
SHEATH PINNACLE 5F 10CM (SHEATH) IMPLANT
SHEATH PINNACLE 6F 10CM (SHEATH) IMPLANT
SHEATH PROBE COVER 6X72 (BAG) IMPLANT
WIRE BENTSON .035X145CM (WIRE) IMPLANT
WIRE G V18X300CM (WIRE) IMPLANT
WIRE HI TORQ COMMND ES.014X300 (WIRE) IMPLANT
WIRE SPARTACORE .014X300CM (WIRE) IMPLANT

## 2023-11-04 NOTE — Interval H&P Note (Signed)
 History and Physical Interval Note:  11/04/2023 10:36 AM  April Shaffer  has presented today for surgery, with the diagnosis of atherosclerosis right lower extremity.  The various methods of treatment have been discussed with the patient and family. After consideration of risks, benefits and other options for treatment, the patient has consented to  Procedure(s): ABDOMINAL AORTOGRAM (N/A) Lower Extremity Angiography (N/A) as a surgical intervention.  The patient's history has been reviewed, patient examined, no change in status, stable for surgery.  I have reviewed the patient's chart and labs.  Questions were answered to the patient's satisfaction.     Penne [place]

## 2023-11-04 NOTE — Op Note (Signed)
 Patient name: April Shaffer MRN: [id number] DOB: 15-Jan-1951 Sex: female  11/04/2023 Pre-operative Diagnosis: Atherosclerosis native arteries with occluded right SFA stent and the rest pain Post-operative diagnosis:  Same Surgeon:  Penne JAYSON. Sheree, MD Procedure Performed: 1.  Percutaneous ultrasound-guided cannulation and Mynx device closure left common femoral artery 2.  Catheter in aorta and aortogram with bilateral lower extremity angiography 3.  Catheter selection right above-knee popliteal artery 4.  Laser atherectomy with 2.0 mm [place] with 5 mm drug-coated balloon angioplasty right SFAn 5.  Moderate sedation with fentanyl  and Versed  for 79 minutes  Indications: 73 year old female with history of right lower extremity endovascular intervention with stenting on 2 separate occasions.  She now has evidence of occlusion of her distal stent by duplex with recurrence of rest pain and toe pressure of 0.  She does not have wounds of the right lower extremity.  She is indicated for angiography with possible invention.  Findings: Aorta and iliac segments are difficult to evaluate due to significant spinal instrumentation however she does have brisk flow through the bilateral common iliac arteries to the common femoral heads bilaterally where the profunda and superficial femoral arteries are patent initially.  The right lower extremity is the site of interest.  After a patent SFA there is then a stent which does have in-stent restenosis and then a long segment that appears diseased in the SFA and then occluded stent which reconstitutes just in the distal aspect of the stent and has three-vessel runoff although the peroneal is significantly diseased.  After laser atherectomy and drug-coated balloon angioplasty she now has brisk inline flow all the way through without residual stenosis in the stents and no new stents were added.  Dominant runoff is via the anterior tibial and posterior tibial  arteries.   Procedure:  The patient was identified in the holding area and taken to room 8.  The patient was then placed supine on the table and prepped and draped in the usual sterile fashion.  A time out was called.  Ultrasound was used to evaluate the left common femoral artery.  There was some posterior disease reviewed anesthetize the left common femoral artery and then cannulated this with micropuncture needle followed by wire sheath using direct ultrasound visualization and an ultrasound images saved to permanent record.  We placed a micropuncture sheath followed by Bentson wire and 5 French sheath and Omni catheter was placed to the level of L1 and aortogram was performed followed by pelvic angiography with an angle to visualize around the spinal instrumentation.  We then placed the Glidewire up and over the bifurcation followed by Navi cross catheter and perform right lower extremity angiography with the above findings.  We then placed the Glidewire advantage along 6 French sheath into the right common femoral artery and the patient was fully heparinized.  We then used a Glidewire advantage quick cross catheter we crossed the occluded stents and diseased SFA into the popliteal artery and confirmed intraluminal access.  We then placed an 014 wire and then performed a laser atherectomy of the SFA.  We then performed plain balloon angioplasty with 5 mm balloon followed by drug-coated balloon angioplasty at nominal pressure for 3 minutes of the in-stent and nonstented SFA.  Completion demonstrated brisk flow all the way through the SFA into the popliteal artery and down to the anterior tibial and posterior tibial arteries.  Satisfied with this we exchanged for a short 6 French sheath in the left common femoral artery  performed retrograde angiography and then deployed a minx device.  She tolerated the procedure without any complication.   Contrast: 60cc  Daksh Coates C. Sheree, MD [facility] [place] Office: [phone] Pager: [phone]

## 2023-11-04 NOTE — Discharge Instructions (Signed)
 Femoral Site Care This sheet gives you information about how to care for yourself after your procedure. Your health care provider may also give you more specific instructions. If you have problems or questions, contact your health care provider. What can I expect after the procedure?  After the procedure, it is common to have: Bruising that usually fades within 1-2 weeks. Tenderness at the site. Follow these instructions at home: Wound care Follow instructions from your health care provider about how to take care of your insertion site. Make sure you: Wash your hands with soap and water before you change your bandage (dressing). If soap and water are not available, use hand sanitizer. Remove your dressing as told by your health care provider. In 24 hours Do not take baths, swim, or use a hot tub until your health care provider approves. You may shower 24-48 hours after the procedure or as told by your health care provider. Gently wash the site with plain soap and water. Pat the area dry with a clean towel. Do not rub the site. This may cause bleeding. Do not apply powder or lotion to the site. Keep the site clean and dry. Check your femoral site every day for signs of infection. Check for: Redness, swelling, or pain. Fluid or blood. Warmth. Pus or a bad smell. Activity For the first 2-3 days after your procedure, or as long as directed: Avoid climbing stairs as much as possible. Do not squat. Do not lift anything that is heavier than 10 lb (4.5 kg), or the limit that you are told, until your health care provider says that it is safe. For 5 days Rest as directed. Avoid sitting for a long time without moving. Get up to take short walks every 1-2 hours. Do not drive for 24 hours if you were given a medicine to help you relax (sedative). General instructions Take over-the-counter and prescription medicines only as told by your health care provider. Keep all follow-up visits as told by  your health care provider. This is important. Contact a health care provider if you have: A fever or chills. You have redness, swelling, or pain around your insertion site. Get help right away if: The catheter insertion area swells very fast. You pass out. You suddenly start to sweat or your skin gets clammy. The catheter insertion area is bleeding, and the bleeding does not stop when you hold steady pressure on the area. The area near or just beyond the catheter insertion site becomes pale, cool, tingly, or numb. These symptoms may represent a serious problem that is an emergency. Do not wait to see if the symptoms will go away. Get medical help right away. Call your local emergency services (911 in the [place] Do not drive yourself to the hospital. Summary After the procedure, it is common to have bruising that usually fades within 1-2 weeks. Check your femoral site every day for signs of infection. Do not lift anything that is heavier than 10 lb (4.5 kg), or the limit that you are told, until your health care provider says that it is safe. This information is not intended to replace advice given to you by your health care provider. Make sure you discuss any questions you have with your health care provider. Document Revised: 02/25/2017 Document Reviewed: 02/25/2017 Elsevier Patient Education  2020 [employer].

## 2023-11-04 NOTE — Progress Notes (Signed)
 Discharge instructions reviewed with patient and catherine over the telephone, denies questions or concerns. PT was able to void without difficulty. PT also ambulated after bedrest, no s/s of complications at the incision site. PT was escorted from the unit via wheel chair to personal vehicle. PT was able to tolerate a meal with fluids no complaints of N/V/D.

## 2023-11-05 ENCOUNTER — Ambulatory Visit: Payer: Self-pay | Admitting: Internal Medicine

## 2023-11-05 ENCOUNTER — Encounter (HOSPITAL_COMMUNITY): Payer: Self-pay | Admitting: Vascular Surgery

## 2023-11-05 DIAGNOSIS — M869 Osteomyelitis, unspecified: Secondary | ICD-10-CM

## 2023-11-05 MED ORDER — LINEZOLID 600 MG PO TABS
600.0000 mg | ORAL_TABLET | Freq: Two times a day (BID) | ORAL | 0 refills | Status: DC
Start: 2023-11-05 — End: 2023-11-14

## 2023-11-05 NOTE — Telephone Encounter (Signed)
 Left voicemail asking patient to return my call.   Yvonne Stopher Lesli Albee, CMA

## 2023-11-05 NOTE — Telephone Encounter (Signed)
-----   Message from Constance ONEIDA Passer sent at 11/05/2023  9:08 AM EDT ----- Hi team. 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 I plan to treat her life long though. It appears she might have finished IV abx.  Could you check and ask her to come in 1-2 weeks  In the mean time let's do linezolid  for 2 weeks 600 mg po bid... bactrim  is the only other oral pill that works but her creatinine is a little high at this time so I want to avoid  We'll need to recheck bmp again before starting bactrim .    Linezolid  rx is in. thanks ----- Message ----- From: Interface, Rad Results In Sent: 10/09/2023   5:32 PM EDT To: Constance ONEIDA Passer, MD

## 2023-11-06 NOTE — Telephone Encounter (Signed)
 Patient returned call and advised to stop the Bactrim  and start Linezolid . Patient will pick up the medication today. Patient scheduled for 11/14/23

## 2023-11-07 ENCOUNTER — Telehealth: Payer: Self-pay

## 2023-11-07 NOTE — Telephone Encounter (Signed)
 Patient called c/o of mild pain in her right foot.  She reported the foot is warm and just a little tingling.  No swelling and groin incision is good.  She reported that she takes her Oxycodone  every 6-8 hours.   Advised to continue to walk as tolerated and take Tylenol  in between doses of Oxy but not to exceed 4000 mg in a 24-hour period.  Patient verbally agreed to instructions given.

## 2023-11-11 ENCOUNTER — Other Ambulatory Visit: Payer: Self-pay

## 2023-11-11 DIAGNOSIS — I739 Peripheral vascular disease, unspecified: Secondary | ICD-10-CM

## 2023-11-11 DIAGNOSIS — M79604 Pain in right leg: Secondary | ICD-10-CM

## 2023-11-14 ENCOUNTER — Other Ambulatory Visit: Payer: Self-pay

## 2023-11-14 ENCOUNTER — Encounter: Payer: Self-pay | Admitting: Nurse Practitioner

## 2023-11-14 ENCOUNTER — Encounter: Payer: Self-pay | Admitting: Internal Medicine

## 2023-11-14 ENCOUNTER — Ambulatory Visit (INDEPENDENT_AMBULATORY_CARE_PROVIDER_SITE_OTHER): Admitting: Internal Medicine

## 2023-11-14 ENCOUNTER — Other Ambulatory Visit (HOSPITAL_COMMUNITY): Payer: Self-pay

## 2023-11-14 VITALS — BP 150/63 | HR 71 | Temp 98.5°F | Ht 63.0 in | Wt 118.0 lb

## 2023-11-14 DIAGNOSIS — T887XXA Unspecified adverse effect of drug or medicament, initial encounter: Secondary | ICD-10-CM | POA: Diagnosis not present

## 2023-11-14 DIAGNOSIS — M4626 Osteomyelitis of vertebra, lumbar region: Secondary | ICD-10-CM | POA: Diagnosis not present

## 2023-11-14 DIAGNOSIS — T847XXD Infection and inflammatory reaction due to other internal orthopedic prosthetic devices, implants and grafts, subsequent encounter: Secondary | ICD-10-CM

## 2023-11-14 DIAGNOSIS — Z8614 Personal history of Methicillin resistant Staphylococcus aureus infection: Secondary | ICD-10-CM

## 2023-11-14 DIAGNOSIS — N760 Acute vaginitis: Secondary | ICD-10-CM | POA: Diagnosis not present

## 2023-11-14 MED ORDER — FLUCONAZOLE 200 MG PO TABS: ORAL_TABLET | ORAL | 0 refills | Status: DC

## 2023-11-14 MED ORDER — TEDIZOLID PHOSPHATE 200 MG PO TABS: 200.0000 mg | ORAL_TABLET | Freq: Every day | ORAL | 1 refills | Status: DC

## 2023-11-14 NOTE — Patient Instructions (Signed)
 We will stop your linezolid  and try tedizolid  Our pharmacy team will have to look at insurance authorization for this   See us  in 2 weeks againg. If symptoms are still there, we only have one option left to go back to bactrim  and in which case will have to treat diarrhea/nausea as they come    Fluconazole  take 1 dose and repeat in 3 days if still having burning in your private area    Labs today

## 2023-11-14 NOTE — Addendum Note (Signed)
 Addended byBETHA OVERTON FAITH T on: 11/14/2023 11:37 AM   Modules accepted: Orders

## 2023-11-14 NOTE — Progress Notes (Addendum)
 Subjective:   Patient ID: April Shaffer, female    DOB: [birth year], 73 y.o.   MRN: [id number]  Chief Complaint  Patient presents with   Follow-up    Altered taste; burning in mouth when brushing teeth Tingling in hands    HPI:  April Shaffer is a 73 y.o. 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 No additional surgical interventions recommended per Neurosurgery. Here today for follow up.  Ms. Justo completed her Daptomycin  on 8/28 without complication and PICC line has been removed. Continues to have itching but no significant back pain and is ambulating with a cane. No fevers, chills or night sweats. Has questions about the plan of care and probiotics.    11/14/23 id clinic f/u Here with her friend Complaints of itching in vaginal area and numbness/tingling/taste issue No back pain pain No f/c No malaise  Allergies  Allergen Reactions   Zestoretic  [Lisinopril -Hydrochlorothiazide ] Swelling and Other (See Comments)    Angioedema + Tongue swelling    Codeine Itching   Oxycodone  Itching and Other (See Comments)    Patient is taking in 2025   Ultram  [Tramadol ] Itching   Zocor  [Simvastatin ] Other (See Comments)    Muscle pain      Outpatient Medications Prior to Visit  Medication Sig Dispense Refill   acetaminophen  (TYLENOL ) 500 MG tablet Take 500 mg by mouth every 6 (six) hours as needed for mild pain (pain score 1-3), moderate pain (pain score 4-6) or headache.     amLODipine  (NORVASC ) 10 MG tablet Take 10 mg by mouth in the morning.     aspirin  EC 81 MG tablet Take 1 tablet (81 mg total) by mouth 2 (two) times daily. To prevent blood clots for 30 days after surgery. (Patient taking differently: Take 81 mg by mouth daily.) 60 tablet 0   carvedilol  (COREG  CR) 10 MG 24 hr capsule Take 10 mg by mouth in the morning.     clopidogrel  (PLAVIX ) 75 MG tablet Take 75  mg by mouth in the morning.     cyanocobalamin  (VITAMIN B12) 1000 MCG/ML injection Inject 1,000 mcg into the muscle every 30 (thirty) days.     ezetimibe  (ZETIA ) 10 MG tablet TAKE 1 TABLET EVERY DAY (NEED MD APPOINTMENT) (Patient taking differently: Take 10 mg by mouth daily.) 90 tablet 0   ferrous sulfate  325 (65 FE) MG tablet Take 325 mg by mouth 3 (three) times a week.     gabapentin  (NEURONTIN ) 300 MG capsule Take 300 mg by mouth in the morning, at noon, and at bedtime.     glucose blood test strip Check sugars twice daily 100 each 11   hydrochlorothiazide  (HYDRODIURIL ) 12.5 MG tablet Take 12.5 mg by mouth in the morning.     linezolid  (ZYVOX ) 600 MG tablet Take 1 tablet (600 mg total) by mouth 2 (two) times daily for 14 days. 28 tablet 0   metFORMIN  (GLUCOPHAGE -XR) 750 MG 24 hr tablet Take 750 mg by mouth in the morning.     oxyCODONE -acetaminophen  (PERCOCET) 10-325 MG tablet Take 1 tablet by mouth every 4 (four) hours as needed for pain. 40 tablet 0   sodium chloride  flush 0.9 % SOLN injection Flush with 5-10mL of saline daily to prevent clogging 200 mL 0   No facility-administered medications prior to visit.     Past Medical History:  Diagnosis Date   Anemia  Arthritis    Blood transfusion without reported diagnosis    Carotid artery narrowing    Coronary artery disease    Cardiac catheterization November 2013: 50% ostial LAD stenosis 50% mid stenosis. 30% disease in the left circumflex.   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   Sleep apnea       Had surgery to correct   Thyroid  nodule      Past Surgical History:  Procedure Laterality Date   ABDOMINAL AORTOGRAM N/A 11/04/2023   Procedure: ABDOMINAL AORTOGRAM;  Surgeon: Sheree Penne Bruckner, MD;  Location[facility] INVASIVE CV LAB;  Service: Cardiovascular;  Laterality: N/A;    ABDOMINAL AORTOGRAM W/LOWER EXTREMITY N/A 08/07/2021   Procedure: ABDOMINAL AORTOGRAM W/LOWER EXTREMITY;  Surgeon: Sheree Penne Bruckner, MD;  Location: [facility] INVASIVE CV LAB;  Service: Cardiovascular;  Laterality: N/A;   ABDOMINAL AORTOGRAM W/LOWER EXTREMITY Right 10/23/2021   Procedure: ABDOMINAL AORTOGRAM W/LOWER EXTREMITY;  Surgeon: Sheree Penne Bruckner, MD;  Location: [facility] INVASIVE CV LAB;  Service: Cardiovascular;  Laterality: Right;   ABDOMINAL HYSTERECTOMY     APPLICATION OF INTRAOPERATIVE CT SCAN N/A 11/30/2020   Procedure: APPLICATION OF INTRAOPERATIVE CT SCAN;  Surgeon: Joshua Alm RAMAN, MD;  Location: [facility] OR;  Service: Neurosurgery;  Laterality: N/A;   BACK SURGERY     CARDIAC CATHETERIZATION     CATARACT EXTRACTION, BILATERAL Bilateral 2021   Dr. Milissa   ENDARTERECTOMY  09/27/2011   Procedure: RIGT ENDARTERECTOMY CAROTID;  Surgeon: Lynwood JONETTA Collum, MD;  Location: [facility] OR;  Service: Vascular;  Laterality: Right;   EPIDURAL BLOCK INJECTION  02/2008   Drs. Clydell Mince   EYE SURGERY Bilateral 2021   cataract   gyn surgery  2004   total hysterectomy for mennorhagia,,salpingoophorectomy   INCISION AND DRAINAGE ABSCESS Left 05/08/2022   Procedure: ARTHROSCOPIC INCISION AND DRAINAGE ABSCESS, DISTAL CLAVICLE EXCISSION, SUBACROMIAL DECOMPRESSION, LOOSE BODY REMOVAL, EXTENSIVE DEBRIDEMENT;  Surgeon: Beverley Evalene JONETTA, MD;  Location: [facility] ORS;  Service: Orthopedics;  Laterality: Left;   IR RADIOLOGIST EVAL & MGMT  10/09/2023   LAMINECTOMY WITH POSTERIOR LATERAL ARTHRODESIS LEVEL 2 N/A 11/30/2020   Procedure: LUMBAR FOUR-FIVE, LUMBAR FIVE-SACRAL ONE POSTERIOR LATERAL FUSION WITH REVISION OF LUMBAR ONE-FIVE HARDWARE AND EXTENSION TO SACRAL ONE AND SACRAL TWO;  Surgeon: Joshua Alm RAMAN, MD;  Location: [facility] OR;  Service: Neurosurgery;  Laterality: N/A;   LOWER EXTREMITY ANGIOGRAPHY N/A 11/04/2023   Procedure: Lower Extremity Angiography;  Surgeon: Sheree Penne Bruckner, MD;  Location: [facility]  INVASIVE CV LAB;  Service: Cardiovascular;  Laterality: N/A;   LOWER EXTREMITY INTERVENTION Right 11/04/2023   Procedure: LOWER EXTREMITY INTERVENTION;  Surgeon: Sheree Penne Bruckner, MD;  Location: [facility] INVASIVE CV LAB;  Service: Cardiovascular;  Laterality: Right;   LUMBAR WOUND DEBRIDEMENT N/A 05/25/2020   Procedure: LUMBAR WOUND IRRIGATION AND DEBRIDEMENT;  Surgeon: Joshua Alm RAMAN, MD;  Location: [facility] OR;  Service: Neurosurgery;  Laterality: N/A;   PERIPHERAL VASCULAR INTERVENTION  08/07/2021   Procedure: PERIPHERAL VASCULAR INTERVENTION;  Surgeon: Sheree Penne Bruckner, MD;  Location: [facility] INVASIVE CV LAB;  Service: Cardiovascular;;  Rt SFA   PERIPHERAL VASCULAR INTERVENTION Right 10/23/2021   Procedure: PERIPHERAL VASCULAR INTERVENTION;  Surgeon: Sheree Penne Bruckner, MD;  Location: [facility] INVASIVE CV LAB;  Service: Cardiovascular;  Laterality: Right;  SFA   POSTERIOR LUMBAR FUSION 4 LEVEL N/A 04/25/2020   Procedure: POSTERIOR  LUMBAR INTERBODY FUSION LUMBAR ONE-TWO, LUMBAR TWO-THREE, LUMBAR THREE-FOUR,LUMBAR FOUR-FIVE.;  Surgeon: Joshua Alm RAMAN, MD;  Location: [facility] OR;  Service: Neurosurgery;  Laterality: N/A;  posterior   SHOULDER ARTHROSCOPY Left 05/08/2022   Procedure: ARTHROSCOPY SHOULDER;  Surgeon: Beverley Evalene BIRCH, MD;  Location: [facility] ORS;  Service: Orthopedics;  Laterality: Left;   TONSILLECTOMY     TOTAL ABDOMINAL HYSTERECTOMY W/ BILATERAL SALPINGOOPHORECTOMY Bilateral 2000   TOTAL HIP ARTHROPLASTY Right 08/22/2018   Procedure: TOTAL HIP ARTHROPLASTY;  Surgeon: Shari Sieving, MD;  Location: [facility] ORS;  Service: Orthopedics;  Laterality: Right;   TOTAL HIP ARTHROPLASTY Left 07/16/2023   Procedure: ARTHROPLASTY, HIP, TOTAL, ANTERIOR APPROACH;  Surgeon: Beverley Evalene BIRCH, MD;  Location: [facility] ORS;  Service: Orthopedics;  Laterality: Left;   TRANSCAROTID ARTERY REVASCULARIZATION  Left 08/07/2022   Procedure: Transcarotid Artery Revascularization;  Surgeon: Sheree Penne Bruckner, MD;   Location: [facility] OR;  Service: Vascular;  Laterality: Left;   ULTRASOUND GUIDANCE FOR VASCULAR ACCESS Left 08/07/2022   Procedure: ULTRASOUND GUIDANCE FOR VASCULAR ACCESS;  Surgeon: Sheree Penne Bruckner, MD;  Location: [facility] OR;  Service: Vascular;  Laterality: Left;   VARICOSE VEIN SURGERY  2008   stripping       Review of Systems  Constitutional:  Negative for chills, diaphoresis, fatigue and fever.  Respiratory:  Negative for cough, chest tightness, shortness of breath and wheezing.   Cardiovascular:  Negative for chest pain.  Gastrointestinal:  Negative for abdominal pain, diarrhea, nausea and vomiting.    Objective:   BP (!) 150/63   Pulse 71   Temp 98.5 F (36.9 C) (Temporal)   Ht 5' 3 (1.6 m)   Wt 118 lb (53.5 kg)   SpO2 100%   BMI 20.90 kg/m  Nursing note and vital signs reviewed.  Physical Exam Constitutional:      General: She is not in acute distress.    Appearance: She is well-developed.  Cardiovascular:     Rate and Rhythm: Normal rate and regular rhythm.     Heart sounds: Normal heart sounds.  Pulmonary:     Effort: Pulmonary effort is normal.     Breath sounds: Normal breath sounds.  Skin:    General: Skin is warm and dry.  Neurological:     Mental Status: She is alert and oriented to person, place, and time.     Labs: Lab Results  Component Value Date   WBC 11.7 (H) 09/18/2023   HGB 10.2 (L) 11/04/2023   HCT 30.0 (L) 11/04/2023   MCV 80.5 09/18/2023   PLT 347 09/18/2023   Last metabolic panel Lab Results  Component Value Date   GLUCOSE 79 11/04/2023   NA 137 11/04/2023   K 3.8 11/04/2023   CL 102 11/04/2023   CO2 23 09/12/2023   BUN 21 11/04/2023   CREATININE 1.40 (H) 11/04/2023   GFRNONAA >60 09/12/2023   CALCIUM  8.2 (L) 09/12/2023   PHOS 3.8 02/26/2011   PROT 9.2 (H) 09/11/2023   ALBUMIN  2.4 (L) 09/11/2023   LABGLOB 4.9 (H) 08/21/2021   AGRATIO 1.5 08/04/2019   BILITOT 0.5 09/11/2023   ALKPHOS 93 09/11/2023   AST 11 (L)  09/11/2023   ALT 6 09/11/2023   ANIONGAP 8 09/12/2023    Assessment & Plan:     Imaging: Reviewed  10/09/23 abd pelv ct Percutaneous drainage catheter in the right iliopsoas region, with no residual fluid collection at this site.   No acute findings.   Stable lucency surrounding the lumbar and bilateral sacroiliac  joint fusion hardware, consistent with loosening or chronic infection.   09/06/23 mir lumbar spine 1. Operative changes of posterior decompression and fusion from L1 through the sacrum. Interbody fusion at L1-L2, L2-L3, L3-L4, and L4-L5. Abutment metal hardware artifact obscures portions of the canal and posterior soft tissues. 2. Fluid collection in the right psoas muscle measuring up to 9-10 cm in craniocaudal dimension. There is also a prevertebral fluid collection at L4-L5. Findings may represent postoperative hematomas or seromas. Additional fluid collection in the superficial posterior soft tissue at the level of L1, may also represent a postoperative seroma or hematoma. Superimposed infections in the collections is not excluded on imaging. 3. 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    Problem List Items Addressed This Visit     Hardware complicating wound infection (HCC)   Relevant Medications   Tedizolid Phosphate  200 MG TABS   fluconazole  (DIFLUCAN ) 200 MG tablet   Other Visit Diagnoses       Acute vaginitis    -  Primary     Side effect of medication       Relevant Orders   COMPLETE METABOLIC PANEL WITHOUT GFR   C-reactive protein   CBC w/Diff     Acute osteomyelitis of lumbar spine (HCC)       Relevant Medications   Tedizolid Phosphate  200 MG TABS   fluconazole  (DIFLUCAN ) 200 MG tablet        #vertebral osteomyelitis #psoas abscess s/p drainage and resolved on 10/09/23 ct scan #hardware complicating wound infection   11/14/23 patient complains of burning in the mouth and  weird tastes and tingling in her hands since she takes the bactrim  She also reports vaginal itching as well for a few days  She was changed from bactrim  to linezolid  recently because bactrim  was causing diarrhea/poor appetite -- nausea  She was transitioned to linezolid  9/09   -fluconazole  200 mg once, and can repeat in 3 days if still symptomatic with vaginitis -side effect to bactrim ; unfortunately 09/12/23 mrsa on cx not sensitive to doxycycline -will try tedezolid 200 mg once aday -once she is in her 6 months of abx from 09/12/23 (which is 02/2024) we can try linezolid  600 mg po daily which is a lower dose and might be fine for suppressive purpose   -f/u in 2-3 weeks   Follow-up: Return in about 2 weeks (around 11/28/2023). or sooner if needed.         Constance ONEIDA Passer, MD [facility] [phone]  pager   [phone] cell 11/14/2023, 11:37 AM

## 2023-11-15 ENCOUNTER — Other Ambulatory Visit (HOSPITAL_COMMUNITY): Payer: Self-pay

## 2023-11-15 ENCOUNTER — Telehealth: Payer: Self-pay

## 2023-11-15 LAB — COMPLETE METABOLIC PANEL WITHOUT GFR
AG Ratio: 0.6 (calc) — ABNORMAL LOW (ref 1.0–2.5)
ALT: 4 U/L — ABNORMAL LOW (ref 6–29)
AST: 12 U/L (ref 10–35)
Albumin: 3.3 g/dL — ABNORMAL LOW (ref 3.6–5.1)
Alkaline phosphatase (APISO): 107 U/L (ref 37–153)
BUN/Creatinine Ratio: 12 (calc) (ref 6–22)
BUN: 13 mg/dL (ref 7–25)
CO2: 27 mmol/L (ref 20–32)
Calcium: 9.1 mg/dL (ref 8.6–10.4)
Chloride: 99 mmol/L (ref 98–110)
Creat: 1.09 mg/dL — ABNORMAL HIGH (ref 0.60–1.00)
Globulin: 5.5 g/dL — ABNORMAL HIGH (ref 1.9–3.7)
Glucose, Bld: 124 mg/dL — ABNORMAL HIGH (ref 65–99)
Potassium: 3.6 mmol/L (ref 3.5–5.3)
Sodium: 133 mmol/L — ABNORMAL LOW (ref 135–146)
Total Bilirubin: 0.6 mg/dL (ref 0.2–1.2)
Total Protein: 8.8 g/dL — ABNORMAL HIGH (ref 6.1–8.1)

## 2023-11-15 LAB — CBC WITH DIFFERENTIAL/PLATELET
Absolute Lymphocytes: 1393 {cells}/uL (ref 850–3900)
Absolute Monocytes: 329 {cells}/uL (ref 200–950)
Basophils Absolute: 42 {cells}/uL (ref 0–200)
Basophils Relative: 0.6 %
Eosinophils Absolute: 112 {cells}/uL (ref 15–500)
Eosinophils Relative: 1.6 %
HCT: 29.2 % — ABNORMAL LOW (ref 35.0–45.0)
Hemoglobin: 8.9 g/dL — ABNORMAL LOW (ref 11.7–15.5)
MCH: 25.4 pg — ABNORMAL LOW (ref 27.0–33.0)
MCHC: 30.5 g/dL — ABNORMAL LOW (ref 32.0–36.0)
MCV: 83.4 fL (ref 80.0–100.0)
MPV: 9 fL (ref 7.5–12.5)
Monocytes Relative: 4.7 %
Neutro Abs: 5124 {cells}/uL (ref 1500–7800)
Neutrophils Relative %: 73.2 %
Platelets: 265 Thousand/uL (ref 140–400)
RBC: 3.5 Million/uL — ABNORMAL LOW (ref 3.80–5.10)
RDW: 17.9 % — ABNORMAL HIGH (ref 11.0–15.0)
Total Lymphocyte: 19.9 %
WBC: 7 Thousand/uL (ref 3.8–10.8)

## 2023-11-15 LAB — C-REACTIVE PROTEIN: CRP: 20.4 mg/L — ABNORMAL HIGH (ref <8.0)

## 2023-11-15 NOTE — Telephone Encounter (Signed)
 Submitted a Prior Authorization Appeal request to [employer] for Sivextro  via Phone. Will update once we receive a response.  Called [employer] for a PA Appeal , I will fax chartnotes.   Appeal can take up too 7 Days.   Ref # [id number] [facility] # [phone] Fax # [phone]

## 2023-11-18 ENCOUNTER — Other Ambulatory Visit (HOSPITAL_COMMUNITY): Payer: Self-pay

## 2023-11-18 ENCOUNTER — Telehealth: Payer: Self-pay

## 2023-11-18 NOTE — Telephone Encounter (Signed)
 Received notification from [employer] regarding a prior authorization for Sivextro . Authorization has been APPROVED from 02/27/23 to 02/26/24.   Per test claim, copay for 30 days supply is $0.00  Patient can fill through [facility]: [phone]   Authorization # [id number] Phone # [phone]

## 2023-11-18 NOTE — Telephone Encounter (Signed)
Thank you so much Donna!

## 2023-11-19 ENCOUNTER — Other Ambulatory Visit (HOSPITAL_COMMUNITY): Payer: Self-pay

## 2023-11-19 ENCOUNTER — Other Ambulatory Visit: Payer: Self-pay

## 2023-11-19 DIAGNOSIS — D500 Iron deficiency anemia secondary to blood loss (chronic): Secondary | ICD-10-CM

## 2023-11-19 DIAGNOSIS — D649 Anemia, unspecified: Secondary | ICD-10-CM

## 2023-11-19 DIAGNOSIS — E538 Deficiency of other specified B group vitamins: Secondary | ICD-10-CM

## 2023-11-19 DIAGNOSIS — D638 Anemia in other chronic diseases classified elsewhere: Secondary | ICD-10-CM

## 2023-11-20 ENCOUNTER — Inpatient Hospital Stay: Attending: Nurse Practitioner

## 2023-11-20 ENCOUNTER — Inpatient Hospital Stay

## 2023-11-20 DIAGNOSIS — D508 Other iron deficiency anemias: Secondary | ICD-10-CM | POA: Diagnosis present

## 2023-11-20 DIAGNOSIS — E538 Deficiency of other specified B group vitamins: Secondary | ICD-10-CM

## 2023-11-20 DIAGNOSIS — D638 Anemia in other chronic diseases classified elsewhere: Secondary | ICD-10-CM

## 2023-11-20 DIAGNOSIS — D500 Iron deficiency anemia secondary to blood loss (chronic): Secondary | ICD-10-CM

## 2023-11-20 DIAGNOSIS — D649 Anemia, unspecified: Secondary | ICD-10-CM

## 2023-11-20 LAB — CBC WITH DIFFERENTIAL (CANCER CENTER ONLY)
Abs Immature Granulocytes: 0.04 K/uL (ref 0.00–0.07)
Basophils Absolute: 0.1 K/uL (ref 0.0–0.1)
Basophils Relative: 1 %
Eosinophils Absolute: 0.2 K/uL (ref 0.0–0.5)
Eosinophils Relative: 2 %
HCT: 24.2 % — ABNORMAL LOW (ref 36.0–46.0)
Hemoglobin: 7.7 g/dL — ABNORMAL LOW (ref 12.0–15.0)
Immature Granulocytes: 0 %
Lymphocytes Relative: 16 %
Lymphs Abs: 1.8 K/uL (ref 0.7–4.0)
MCH: 25.4 pg — ABNORMAL LOW (ref 26.0–34.0)
MCHC: 31.8 g/dL (ref 30.0–36.0)
MCV: 79.9 fL — ABNORMAL LOW (ref 80.0–100.0)
Monocytes Absolute: 1.2 K/uL — ABNORMAL HIGH (ref 0.1–1.0)
Monocytes Relative: 10 %
Neutro Abs: 8.2 K/uL — ABNORMAL HIGH (ref 1.7–7.7)
Neutrophils Relative %: 71 %
Platelet Count: 192 K/uL (ref 150–400)
RBC: 3.03 MIL/uL — ABNORMAL LOW (ref 3.87–5.11)
RDW: 19.7 % — ABNORMAL HIGH (ref 11.5–15.5)
WBC Count: 11.5 K/uL — ABNORMAL HIGH (ref 4.0–10.5)
nRBC: 0 % (ref 0.0–0.2)

## 2023-11-20 LAB — FERRITIN: Ferritin: 279 ng/mL (ref 11–307)

## 2023-11-20 LAB — IRON AND IRON BINDING CAPACITY (CC-WL,HP ONLY)
Iron: 12 ug/dL — ABNORMAL LOW (ref 28–170)
Saturation Ratios: 5 % — ABNORMAL LOW (ref 10.4–31.8)
TIBC: 266 ug/dL (ref 250–450)
UIBC: 254 ug/dL (ref 148–442)

## 2023-11-20 LAB — SAMPLE TO BLOOD BANK

## 2023-11-20 LAB — VITAMIN B12: Vitamin B-12: 529 pg/mL (ref 180–914)

## 2023-11-20 MED ORDER — CYANOCOBALAMIN 1000 MCG/ML IJ SOLN
1000.0000 ug | Freq: Once | INTRAMUSCULAR | Status: AC
  Administered 2023-11-20: 1000 ug via INTRAMUSCULAR
  Filled 2023-11-20: qty 1

## 2023-11-21 ENCOUNTER — Ambulatory Visit: Payer: Self-pay | Admitting: Hematology

## 2023-11-21 ENCOUNTER — Inpatient Hospital Stay (HOSPITAL_BASED_OUTPATIENT_CLINIC_OR_DEPARTMENT_OTHER): Admitting: Hematology

## 2023-11-21 DIAGNOSIS — E538 Deficiency of other specified B group vitamins: Secondary | ICD-10-CM | POA: Diagnosis not present

## 2023-11-21 DIAGNOSIS — D638 Anemia in other chronic diseases classified elsewhere: Secondary | ICD-10-CM

## 2023-11-21 NOTE — Progress Notes (Signed)
 [facility]   Telephone:([phone]) [phone] Fax:(336) [phone]   Clinic Follow up Note   Patient Care Team: Arloa Jarvis, NP as PCP - General (Nurse Practitioner) Court Dorn PARAS, MD as PCP - Cardiology (Cardiology) Shari Sieving, MD as Consulting Physician (Orthopedic Surgery) Lanny Callander, MD as Consulting Physician (Hematology) Burton, Lacie K, NP as Nurse Practitioner (Nurse Practitioner) Claudene Prentice DELENA Mickey., FNP as Nurse Practitioner (Family Medicine) Joshua Alm Hamilton, MD as Consulting Physician (Neurosurgery) 11/21/2023  I connected with April Shaffer on 11/21/23 at  1:40 PM EDT by telephone and verified that I am speaking with the correct person using two identifiers.   I discussed the limitations, risks, security and privacy concerns of performing an evaluation and management service by telephone and the availability of in person appointments. I also discussed with the patient that there may be a patient responsible charge related to this service. The patient expressed understanding and agreed to proceed.   Patient's location: Home Provider's location:  Office    CHIEF COMPLAINT: Follow-up anemia   CURRENT THERAPY:   Oncology history B12 deficiency -previous lab showed low B12 and elevated MMA which confirms B12 deficiency.   -intrinsic factor from 08/31/21 was normal  -she began B12 injections on 08/31/21, weekly through 10/11/21 and now on monthly. Last dose in 12/2021 -She had a multiple hospital admission since January 2024, and missed her B12 injections, her anemia got worse, but improved again after resume B12 injections   Anemia of chronic disease -iron  panel 08/01/21 indicative of anemia of chronic disease rather than true iron  deficiency  --iron  on 12/06/21 was 19. She received 3 doses IV Venofer  from 12/22/21.  She responded well, hemoglobin improved to 11.8 after IV iron .  Assessment & Plan Anemia of chronic disease and iron  deficiency Chronic  anemia with a recent hemoglobin drop from 10.2 to 7.7 over two weeks. No visible bleeding. Possible contributing factors include recent vascular surgery, infection, medication, and frequent blood draws. Responded to IV iron  in the past without adverse reactions. No current signs of acute blood loss or need for immediate blood transfusion. - Administer three doses of IV iron  at the [employer]. - Schedule a follow-up blood test in two to three weeks to monitor hemoglobin levels. - Instruct her to report increased fatigue or symptoms of anemia. - Consider blood transfusion if hemoglobin levels do not improve or if symptoms worsen.  Plan - Lab reviewed, will schedule Venofer  200 mg x 3 at the [facility] history - Repeat lab in 2 weeks - Lab, follow-up and B12 injection in 1 month   Discussed the use of AI scribe software for clinical note transcription with the patient, who gave verbal consent to proceed.  History of Present Illness April Shaffer is a 73 year old female with anemia who presents for follow-up.  Her hemoglobin level has decreased from 10.2 g/dL two weeks ago to 7.7 g/dL as of yesterday, with increased fatigue. There is no visible bleeding, such as blood in stool or urine. She has frequent blood draws.  Two weeks ago, she underwent a procedure to replace a failed stent in her leg, resulting in pain and swelling. She is scheduled to follow up with her vein and vascular doctor on October 15.  She is currently receiving B12 injections and has previously responded to IV iron  treatments. She recalls a past blood transfusion but is uncertain about the details. She has not had IV iron  since 2023 and did  not experience any adverse reactions at that time.     REVIEW OF SYSTEMS:   Constitutional: Denies fevers, chills or abnormal weight loss Eyes: Denies blurriness of vision Ears, nose, mouth, throat, and face: Denies mucositis or sore throat Respiratory: Denies cough,  dyspnea or wheezes Cardiovascular: Denies palpitation, chest discomfort or lower extremity swelling Gastrointestinal:  Denies nausea, heartburn or change in bowel habits Skin: Denies abnormal skin rashes Lymphatics: Denies new lymphadenopathy or easy bruising Neurological:Denies numbness, tingling or new weaknesses Behavioral/Psych: Mood is stable, no new changes  All other systems were reviewed with the patient and are negative.  MEDICAL HISTORY:  Past Medical History:  Diagnosis Date   Anemia    Arthritis    Blood transfusion without reported diagnosis    Carotid artery narrowing    Coronary artery disease    Cardiac catheterization November 2013: 50% ostial LAD stenosis 50% mid stenosis. 30% disease in the left circumflex.   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   Sleep apnea       Had surgery to correct   Thyroid  nodule     SURGICAL HISTORY: Past Surgical History:  Procedure Laterality Date   ABDOMINAL AORTOGRAM N/A 11/04/2023   Procedure: ABDOMINAL AORTOGRAM;  Surgeon: Sheree Penne Bruckner, MD;  Location: [facility] INVASIVE CV LAB;  Service: Cardiovascular;  Laterality: N/A;   ABDOMINAL AORTOGRAM W/LOWER EXTREMITY N/A 08/07/2021   Procedure: ABDOMINAL AORTOGRAM W/LOWER EXTREMITY;  Surgeon: Sheree Penne Bruckner, MD;  Location: [facility] INVASIVE CV LAB;  Service: Cardiovascular;  Laterality: N/A;   ABDOMINAL AORTOGRAM W/LOWER EXTREMITY Right 10/23/2021   Procedure: ABDOMINAL AORTOGRAM W/LOWER EXTREMITY;  Surgeon: Sheree Penne Bruckner, MD;  Location: [facility] INVASIVE CV LAB;  Service: Cardiovascular;  Laterality: Right;   ABDOMINAL HYSTERECTOMY     APPLICATION OF INTRAOPERATIVE CT SCAN N/A 11/30/2020   Procedure: APPLICATION OF INTRAOPERATIVE CT SCAN;  Surgeon: Joshua Alm RAMAN, MD;  Location: [facility] OR;  Service: Neurosurgery;  Laterality: N/A;    BACK SURGERY     CARDIAC CATHETERIZATION     CATARACT EXTRACTION, BILATERAL Bilateral 2021   Dr. Milissa   ENDARTERECTOMY  09/27/2011   Procedure: RIGT ENDARTERECTOMY CAROTID;  Surgeon: Lynwood JONETTA Collum, MD;  Location: [facility] OR;  Service: Vascular;  Laterality: Right;   EPIDURAL BLOCK INJECTION  02/2008   Drs. Clydell Mince   EYE SURGERY Bilateral 2021   cataract   gyn surgery  2004   total hysterectomy for mennorhagia,,salpingoophorectomy   INCISION AND DRAINAGE ABSCESS Left 05/08/2022   Procedure: ARTHROSCOPIC INCISION AND DRAINAGE ABSCESS, DISTAL CLAVICLE EXCISSION, SUBACROMIAL DECOMPRESSION, LOOSE BODY REMOVAL, EXTENSIVE DEBRIDEMENT;  Surgeon: Beverley Evalene JONETTA, MD;  Location: [facility] ORS;  Service: Orthopedics;  Laterality: Left;   IR RADIOLOGIST EVAL & MGMT  10/09/2023   LAMINECTOMY WITH POSTERIOR LATERAL ARTHRODESIS LEVEL 2 N/A 11/30/2020   Procedure: LUMBAR FOUR-FIVE, LUMBAR FIVE-SACRAL ONE POSTERIOR LATERAL FUSION WITH REVISION OF LUMBAR ONE-FIVE HARDWARE AND EXTENSION TO SACRAL ONE AND SACRAL TWO;  Surgeon: Joshua Alm RAMAN, MD;  Location: [facility] OR;  Service: Neurosurgery;  Laterality: N/A;   LOWER EXTREMITY ANGIOGRAPHY N/A 11/04/2023   Procedure: Lower Extremity Angiography;  Surgeon: Sheree Penne Bruckner, MD;  Location: [facility] INVASIVE CV LAB;  Service: Cardiovascular;  Laterality: N/A;   LOWER EXTREMITY INTERVENTION Right 11/04/2023   Procedure: LOWER EXTREMITY INTERVENTION;  Surgeon: Sheree Penne Bruckner, MD;  Location: [facility] INVASIVE CV LAB;  Service: Cardiovascular;  Laterality: Right;   LUMBAR WOUND DEBRIDEMENT N/A 05/25/2020   Procedure: LUMBAR WOUND IRRIGATION AND DEBRIDEMENT;  Surgeon: Joshua Alm RAMAN, MD;  Location: [facility] OR;  Service: Neurosurgery;  Laterality: N/A;   PERIPHERAL VASCULAR INTERVENTION  08/07/2021   Procedure: PERIPHERAL VASCULAR INTERVENTION;  Surgeon: Sheree Penne Bruckner, MD;  Location: [facility] INVASIVE CV LAB;  Service: Cardiovascular;;  Rt SFA   PERIPHERAL  VASCULAR INTERVENTION Right 10/23/2021   Procedure: PERIPHERAL VASCULAR INTERVENTION;  Surgeon: Sheree Penne Bruckner, MD;  Location: [facility] INVASIVE CV LAB;  Service: Cardiovascular;  Laterality: Right;  SFA   POSTERIOR LUMBAR FUSION 4 LEVEL N/A 04/25/2020   Procedure: POSTERIOR LUMBAR INTERBODY FUSION LUMBAR ONE-TWO, LUMBAR TWO-THREE, LUMBAR THREE-FOUR,LUMBAR FOUR-FIVE.;  Surgeon: Joshua Alm RAMAN, MD;  Location: [facility] OR;  Service: Neurosurgery;  Laterality: N/A;  posterior   SHOULDER ARTHROSCOPY Left 05/08/2022   Procedure: ARTHROSCOPY SHOULDER;  Surgeon: Beverley Evalene BIRCH, MD;  Location: [facility] ORS;  Service: Orthopedics;  Laterality: Left;   TONSILLECTOMY     TOTAL ABDOMINAL HYSTERECTOMY W/ BILATERAL SALPINGOOPHORECTOMY Bilateral 2000   TOTAL HIP ARTHROPLASTY Right 08/22/2018   Procedure: TOTAL HIP ARTHROPLASTY;  Surgeon: Shari Sieving, MD;  Location: [facility] ORS;  Service: Orthopedics;  Laterality: Right;   TOTAL HIP ARTHROPLASTY Left 07/16/2023   Procedure: ARTHROPLASTY, HIP, TOTAL, ANTERIOR APPROACH;  Surgeon: Beverley Evalene BIRCH, MD;  Location: [facility] ORS;  Service: Orthopedics;  Laterality: Left;   TRANSCAROTID ARTERY REVASCULARIZATION  Left 08/07/2022   Procedure: Transcarotid Artery Revascularization;  Surgeon: Sheree Penne Bruckner, MD;  Location: [facility] OR;  Service: Vascular;  Laterality: Left;   ULTRASOUND GUIDANCE FOR VASCULAR ACCESS Left 08/07/2022   Procedure: ULTRASOUND GUIDANCE FOR VASCULAR ACCESS;  Surgeon: Sheree Penne Bruckner, MD;  Location: [facility] OR;  Service: Vascular;  Laterality: Left;   VARICOSE VEIN SURGERY  2008   stripping    I have reviewed the social history and family history with the patient and they are unchanged from previous note.  ALLERGIES:  is allergic to zestoretic  [lisinopril -hydrochlorothiazide ], codeine, oxycodone , ultram  [tramadol ], and zocor  [simvastatin ].  MEDICATIONS:  Current Outpatient Medications  Medication Sig Dispense Refill   acetaminophen  (TYLENOL )  500 MG tablet Take 500 mg by mouth every 6 (six) hours as needed for mild pain (pain score 1-3), moderate pain (pain score 4-6) or headache.     amLODipine  (NORVASC ) 10 MG tablet Take 10 mg by mouth in the morning.     aspirin  EC 81 MG tablet Take 1 tablet (81 mg total) by mouth 2 (two) times daily. To prevent blood clots for 30 days after surgery. (Patient taking differently: Take 81 mg by mouth daily.) 60 tablet 0   carvedilol  (COREG  CR) 10 MG 24 hr capsule Take 10 mg by mouth in the morning.     clopidogrel  (PLAVIX ) 75 MG tablet Take 75 mg by mouth in the morning.     cyanocobalamin  (VITAMIN B12) 1000 MCG/ML injection Inject 1,000 mcg into the muscle every 30 (thirty) days.     ezetimibe  (ZETIA ) 10 MG tablet TAKE 1 TABLET EVERY DAY (NEED MD APPOINTMENT) (Patient taking differently: Take 10 mg by mouth daily.) 90 tablet 0   ferrous sulfate  325 (65 FE) MG tablet Take 325 mg by mouth 3 (three) times a week.     fluconazole  (DIFLUCAN ) 200 MG tablet Take 1 tablet and may repeat in 3 days if still having vaginal itching 2 tablet 0   gabapentin  (NEURONTIN ) 300 MG capsule Take 300 mg  by mouth in the morning, at noon, and at bedtime.     glucose blood test strip Check sugars twice daily 100 each 11   hydrochlorothiazide  (HYDRODIURIL ) 12.5 MG tablet Take 12.5 mg by mouth in the morning.     metFORMIN  (GLUCOPHAGE -XR) 750 MG 24 hr tablet Take 750 mg by mouth in the morning.     oxyCODONE -acetaminophen  (PERCOCET) 10-325 MG tablet Take 1 tablet by mouth every 4 (four) hours as needed for pain. 40 tablet 0   sodium chloride  flush 0.9 % SOLN injection Flush with 5-10mL of saline daily to prevent clogging 200 mL 0   Tedizolid Phosphate  200 MG TABS Take 1 tablet (200 mg total) by mouth daily. 30 tablet 1   No current facility-administered medications for this visit.    PHYSICAL EXAMINATION: Not performed   LABORATORY DATA:  I have reviewed the data as listed    Latest Ref Rng & Units 11/20/2023    1:21 PM  11/14/2023   11:40 AM 11/04/2023   10:34 AM  CBC  WBC 4.0 - 10.5 K/uL 11.5  7.0    Hemoglobin 12.0 - 15.0 g/dL 7.7  8.9  89.7   Hematocrit 36.0 - 46.0 % 24.2  29.2  30.0   Platelets 150 - 400 K/uL 192  265          Latest Ref Rng & Units 11/14/2023   11:40 AM 11/04/2023   10:34 AM 09/12/2023    3:52 AM  CMP  Glucose 65 - 99 mg/dL 875  79  92   BUN 7 - 25 mg/dL 13  21  16    Creatinine 0.60 - 1.00 mg/dL 8.90  8.59  9.01   Sodium 135 - 146 mmol/L 133  137  136   Potassium 3.5 - 5.3 mmol/L 3.6  3.8  3.5   Chloride 98 - 110 mmol/L 99  102  105   CO2 20 - 32 mmol/L 27   23   Calcium  8.6 - 10.4 mg/dL 9.1   8.2   Total Protein 6.1 - 8.1 g/dL 8.8     Total Bilirubin 0.2 - 1.2 mg/dL 0.6     AST 10 - 35 U/L 12     ALT 6 - 29 U/L 4         RADIOGRAPHIC STUDIES: I have personally reviewed the radiological images as listed and agreed with the findings in the report. No results found.     I discussed the assessment and treatment plan with the patient. The patient was provided an opportunity to ask questions and all were answered. The patient agreed with the plan and demonstrated an understanding of the instructions.   The patient was advised to call back or seek an in-person evaluation if the symptoms worsen or if the condition fails to improve as anticipated.  I provided 25 minutes of non face-to-face telephone visit time during this encounter, including review of chart and various tests results, discussions about plan of care and coordination of care plan.    Onita Mattock, MD 11/21/23

## 2023-11-21 NOTE — Assessment & Plan Note (Signed)
 -  iron panel 08/01/21 indicative of anemia of chronic disease rather than true iron deficiency  --iron on 12/06/21 was 19. She received 3 doses IV Venofer from 12/22/21.  She responded well, hemoglobin improved to 11.8 after IV iron.

## 2023-11-21 NOTE — Assessment & Plan Note (Signed)
-  previous lab showed low B12 and elevated MMA which confirms B12 deficiency.   -intrinsic factor from 08/31/21 was normal  -she began B12 injections on 08/31/21, weekly through 10/11/21 and now on monthly. Last dose in 12/2021 -She had a multiple hospital admission since January 2024, and missed her B12 injections, her anemia got worse, but improved again after resume B12 injections

## 2023-11-22 ENCOUNTER — Telehealth: Payer: Self-pay | Admitting: Hematology

## 2023-11-22 ENCOUNTER — Telehealth: Payer: Self-pay

## 2023-11-22 NOTE — Telephone Encounter (Signed)
 Dr. Lanny, patient will be scheduled as soon as possible.  Auth Submission: NO AUTH NEEDED Site of care: Site of care: [facility] Payer: [employer] medicare and [place] [employer] Medication & CPT/J Code(s) submitted: Venofer  (Iron  Sucrose) J1756 Diagnosis Code:  Route of submission (phone, fax, portal):  Phone # Fax # Auth type: Buy/Bill PB Units/visits requested: 200mg  x 3 doses Reference number:  Approval from: 11/22/23 to 02/26/24

## 2023-11-22 NOTE — Telephone Encounter (Signed)
 I informed April Shaffer of her lab and MD appt added to her schedule for 10/22. April Shaffer had questions about her iron  infusions an I informed her that [facility] infusion center will contact her to get those appointments scheduled.

## 2023-11-26 ENCOUNTER — Telehealth: Payer: Self-pay

## 2023-11-26 ENCOUNTER — Inpatient Hospital Stay
Admission: RE | Admit: 2023-11-26 | Discharge: 2023-11-26 | Disposition: A | Discharge status: Home / Self Care | Source: Ambulatory Visit | Attending: Nurse Practitioner | Admitting: Nurse Practitioner

## 2023-11-26 DIAGNOSIS — Z1231 Encounter for screening mammogram for malignant neoplasm of breast: Secondary | ICD-10-CM

## 2023-11-26 NOTE — Telephone Encounter (Signed)
 Pt. Called with c/o RLE swelling since AGM. She denies any coolness, color change, pain. She states the swelling improves when she elevates and during the night. Per APP, she can try compression with hose or an ACE wrap and continue to elevate. She is aware to call us  back if anything worsens/changes. She is scheduled for f/u in 2 weeks.

## 2023-11-27 ENCOUNTER — Other Ambulatory Visit (HOSPITAL_COMMUNITY): Payer: Self-pay

## 2023-11-28 ENCOUNTER — Ambulatory Visit: Admitting: Family

## 2023-11-28 ENCOUNTER — Ambulatory Visit

## 2023-12-03 ENCOUNTER — Ambulatory Visit

## 2023-12-03 VITALS — BP 116/64 | HR 59 | Temp 97.9°F | Resp 18 | Ht 63.0 in | Wt 120.2 lb

## 2023-12-03 DIAGNOSIS — D509 Iron deficiency anemia, unspecified: Secondary | ICD-10-CM

## 2023-12-03 DIAGNOSIS — D500 Iron deficiency anemia secondary to blood loss (chronic): Secondary | ICD-10-CM

## 2023-12-03 MED ORDER — ACETAMINOPHEN 325 MG PO TABS
325.0000 mg | ORAL_TABLET | Freq: Once | ORAL | Status: AC
  Administered 2023-12-03: 325 mg via ORAL
  Filled 2023-12-03: qty 1

## 2023-12-03 MED ORDER — ACETAMINOPHEN 325 MG PO TABS: 650.0000 mg | ORAL_TABLET | Freq: Once | ORAL | Status: DC

## 2023-12-03 MED ORDER — SODIUM CHLORIDE 0.9 % IV BOLUS
250.0000 mL | Freq: Once | INTRAVENOUS | Status: AC
  Administered 2023-12-03: 250 mL via INTRAVENOUS
  Filled 2023-12-03: qty 250

## 2023-12-03 MED ORDER — DIPHENHYDRAMINE HCL 25 MG PO CAPS
25.0000 mg | ORAL_CAPSULE | Freq: Once | ORAL | Status: AC
  Administered 2023-12-03: 25 mg via ORAL
  Filled 2023-12-03: qty 1

## 2023-12-03 MED ORDER — IRON SUCROSE 20 MG/ML IV SOLN
200.0000 mg | Freq: Once | INTRAVENOUS | Status: AC
  Administered 2023-12-03: 200 mg via INTRAVENOUS
  Filled 2023-12-03: qty 10

## 2023-12-03 NOTE — Progress Notes (Signed)
 Diagnosis: Acute Anemia  Provider:  Praveen Mannam MD  Procedure: IV Push  IV Type: Peripheral, IV Location: R Antecubital  Venofer  (Iron  Sucrose), Dose: 200 mg  Post Infusion IV Care: Observation period completed  Discharge: Condition: Good, Destination: Home . AVS Declined  Performed by:  Rachelle Bue, RN

## 2023-12-09 ENCOUNTER — Other Ambulatory Visit (HOSPITAL_COMMUNITY): Payer: Self-pay

## 2023-12-09 ENCOUNTER — Ambulatory Visit

## 2023-12-09 VITALS — BP 100/59 | HR 63 | Temp 97.5°F | Resp 18 | Ht 63.0 in | Wt 121.8 lb

## 2023-12-09 DIAGNOSIS — D509 Iron deficiency anemia, unspecified: Secondary | ICD-10-CM | POA: Diagnosis not present

## 2023-12-09 DIAGNOSIS — D500 Iron deficiency anemia secondary to blood loss (chronic): Secondary | ICD-10-CM

## 2023-12-09 MED ORDER — IRON SUCROSE 20 MG/ML IV SOLN
200.0000 mg | Freq: Once | INTRAVENOUS | Status: AC
  Administered 2023-12-09: 200 mg via INTRAVENOUS
  Filled 2023-12-09: qty 10

## 2023-12-09 MED ORDER — DIPHENHYDRAMINE HCL 25 MG PO CAPS
25.0000 mg | ORAL_CAPSULE | Freq: Once | ORAL | Status: AC
  Administered 2023-12-09: 25 mg via ORAL
  Filled 2023-12-09: qty 1

## 2023-12-09 MED ORDER — ACETAMINOPHEN 325 MG PO TABS
650.0000 mg | ORAL_TABLET | Freq: Once | ORAL | Status: AC
  Administered 2023-12-09: 650 mg via ORAL
  Filled 2023-12-09: qty 2

## 2023-12-09 NOTE — Progress Notes (Signed)
 Diagnosis: Iron Deficiency Anemia  Provider:  Chilton Greathouse MD  Procedure: IV Infusion  IV Type: Peripheral, IV Location: R Antecubital  Venofer (Iron Sucrose), Dose: 200 mg IVP.   Post Infusion IV Care: Observation period completed and Peripheral IV Discontinued  Discharge: Condition: Good, Destination: Home . AVS Declined  Performed by:  Garnette Czech, RN

## 2023-12-10 ENCOUNTER — Other Ambulatory Visit: Payer: Self-pay

## 2023-12-10 ENCOUNTER — Ambulatory Visit (INDEPENDENT_AMBULATORY_CARE_PROVIDER_SITE_OTHER): Admitting: Internal Medicine

## 2023-12-10 VITALS — BP 188/72 | HR 69 | Temp 97.7°F | Resp 16 | Wt 122.0 lb

## 2023-12-10 DIAGNOSIS — Z79899 Other long term (current) drug therapy: Secondary | ICD-10-CM

## 2023-12-10 DIAGNOSIS — T8463XD Infection and inflammatory reaction due to internal fixation device of spine, subsequent encounter: Secondary | ICD-10-CM

## 2023-12-10 DIAGNOSIS — T847XXD Infection and inflammatory reaction due to other internal orthopedic prosthetic devices, implants and grafts, subsequent encounter: Secondary | ICD-10-CM

## 2023-12-10 DIAGNOSIS — M4620 Osteomyelitis of vertebra, site unspecified: Secondary | ICD-10-CM | POA: Diagnosis not present

## 2023-12-10 NOTE — Progress Notes (Signed)
 Subjective:   Patient ID: April Shaffer, female    DOB: 1950/04/19, 73 y.o.   MRN: [id number]  Chief Complaint  Patient presents with   Follow-up    INFECTION     HPI:  April Shaffer is a 73 y.o. 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 No additional surgical interventions recommended per Neurosurgery. Here today for follow up.  April Shaffer completed her Daptomycin  on 10/24/23 without complication and PICC line has been removed. Continues to have itching but no significant back pain and is ambulating with a cane. No fevers, chills or night sweats. Has questions about the plan of care and probiotics.    11/14/23 id clinic f/u Here with her friend Complaints of itching in vaginal area and numbness/tingling/taste issue No back pain pain No f/c No malaise   12/10/23 id clinic f/u Patient switched from bactrim  to tedezolid last month 10/2023 No sign of vaginitis or thrush Patient is happy with tedezolid No complaint today except tingling in fingers     Allergies  Allergen Reactions   Zestoretic  [Lisinopril -Hydrochlorothiazide ] Swelling and Other (See Comments)    Angioedema + Tongue swelling    Codeine Itching   Oxycodone  Itching and Other (See Comments)    Patient is taking in 2025   Ultram  [Tramadol ] Itching   Zocor  [Simvastatin ] Other (See Comments)    Muscle pain      Outpatient Medications Prior to Visit  Medication Sig Dispense Refill   acetaminophen  (TYLENOL ) 500 MG tablet Take 500 mg by mouth every 6 (six) hours as needed for mild pain (pain score 1-3), moderate pain (pain score 4-6) or headache.     amLODipine  (NORVASC ) 10 MG tablet Take 10 mg by mouth in the morning.     aspirin  EC 81 MG tablet Take 1 tablet (81 mg total) by mouth 2 (two) times daily. To prevent blood clots for 30 days after surgery. (Patient taking differently: Take 81 mg by  mouth daily.) 60 tablet 0   carvedilol  (COREG  CR) 10 MG 24 hr capsule Take 10 mg by mouth in the morning.     clopidogrel  (PLAVIX ) 75 MG tablet Take 75 mg by mouth in the morning.     cyanocobalamin  (VITAMIN B12) 1000 MCG/ML injection Inject 1,000 mcg into the muscle every 30 (thirty) days.     ezetimibe  (ZETIA ) 10 MG tablet TAKE 1 TABLET EVERY DAY (NEED MD APPOINTMENT) (Patient taking differently: Take 10 mg by mouth daily.) 90 tablet 0   ferrous sulfate  325 (65 FE) MG tablet Take 325 mg by mouth 3 (three) times a week.     fluconazole  (DIFLUCAN ) 200 MG tablet Take 1 tablet and may repeat in 3 days if still having vaginal itching 2 tablet 0   gabapentin  (NEURONTIN ) 300 MG capsule Take 300 mg by mouth in the morning, at noon, and at bedtime.     glucose blood test strip Check sugars twice daily 100 each 11   hydrochlorothiazide  (HYDRODIURIL ) 12.5 MG tablet Take 12.5 mg by mouth in the morning.     metFORMIN  (GLUCOPHAGE -XR) 750 MG 24 hr tablet Take 750 mg by mouth in the morning.     oxyCODONE -acetaminophen  (PERCOCET) 10-325 MG tablet Take 1 tablet by mouth every 4 (four) hours as needed for pain. 40 tablet 0   sodium chloride  flush 0.9 % SOLN injection Flush with 5-10mL of saline daily to prevent clogging 200  mL 0   Tedizolid Phosphate  200 MG TABS Take 1 tablet (200 mg total) by mouth daily. 30 tablet 1   No facility-administered medications prior to visit.     Past Medical History:  Diagnosis Date   Anemia    Arthritis    Blood transfusion without reported diagnosis    Carotid artery narrowing    Coronary artery disease    Cardiac catheterization November 2013: 50% ostial LAD stenosis 50% mid stenosis. 30% disease in the left circumflex.   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   Sleep apnea       Had surgery to correct   Thyroid  nodule       Past Surgical History:  Procedure Laterality Date   ABDOMINAL AORTOGRAM N/A 11/04/2023   Procedure: ABDOMINAL AORTOGRAM;  Surgeon: Sheree Penne Bruckner, MD;  Location: [facility] INVASIVE CV LAB;  Service: Cardiovascular;  Laterality: N/A;   ABDOMINAL AORTOGRAM W/LOWER EXTREMITY N/A 08/07/2021   Procedure: ABDOMINAL AORTOGRAM W/LOWER EXTREMITY;  Surgeon: Sheree Penne Bruckner, MD;  Location: [facility] INVASIVE CV LAB;  Service: Cardiovascular;  Laterality: N/A;   ABDOMINAL AORTOGRAM W/LOWER EXTREMITY Right 10/23/2021   Procedure: ABDOMINAL AORTOGRAM W/LOWER EXTREMITY;  Surgeon: Sheree Penne Bruckner, MD;  Location: [facility] INVASIVE CV LAB;  Service: Cardiovascular;  Laterality: Right;   ABDOMINAL HYSTERECTOMY     APPLICATION OF INTRAOPERATIVE CT SCAN N/A 11/30/2020   Procedure: APPLICATION OF INTRAOPERATIVE CT SCAN;  Surgeon: Joshua Alm RAMAN, MD;  Location: [facility] OR;  Service: Neurosurgery;  Laterality: N/A;   BACK SURGERY     CARDIAC CATHETERIZATION     CATARACT EXTRACTION, BILATERAL Bilateral 2021   Dr. Milissa   ENDARTERECTOMY  09/27/2011   Procedure: RIGT ENDARTERECTOMY CAROTID;  Surgeon: Lynwood JONETTA Collum, MD;  Location: [facility] OR;  Service: Vascular;  Laterality: Right;   EPIDURAL BLOCK INJECTION  02/2008   Drs. Clydell Mince   EYE SURGERY Bilateral 2021   cataract   gyn surgery  2004   total hysterectomy for mennorhagia,,salpingoophorectomy   INCISION AND DRAINAGE ABSCESS Left 05/08/2022   Procedure: ARTHROSCOPIC INCISION AND DRAINAGE ABSCESS, DISTAL CLAVICLE EXCISSION, SUBACROMIAL DECOMPRESSION, LOOSE BODY REMOVAL, EXTENSIVE DEBRIDEMENT;  Surgeon: Beverley Evalene JONETTA, MD;  Location: [facility] ORS;  Service: Orthopedics;  Laterality: Left;   IR RADIOLOGIST EVAL & MGMT  10/09/2023   LAMINECTOMY WITH POSTERIOR LATERAL ARTHRODESIS LEVEL 2 N/A 11/30/2020   Procedure: LUMBAR FOUR-FIVE, LUMBAR FIVE-SACRAL ONE POSTERIOR LATERAL FUSION WITH REVISION OF LUMBAR ONE-FIVE HARDWARE AND EXTENSION TO SACRAL ONE  AND SACRAL TWO;  Surgeon: Joshua Alm RAMAN, MD;  Location: [facility] OR;  Service: Neurosurgery;  Laterality: N/A;   LOWER EXTREMITY ANGIOGRAPHY N/A 11/04/2023   Procedure: Lower Extremity Angiography;  Surgeon: Sheree Penne Bruckner, MD;  Location: [facility] INVASIVE CV LAB;  Service: Cardiovascular;  Laterality: N/A;   LOWER EXTREMITY INTERVENTION Right 11/04/2023   Procedure: LOWER EXTREMITY INTERVENTION;  Surgeon: Sheree Penne Bruckner, MD;  Location: [facility] INVASIVE CV LAB;  Service: Cardiovascular;  Laterality: Right;   LUMBAR WOUND DEBRIDEMENT N/A 05/25/2020   Procedure: LUMBAR WOUND IRRIGATION AND DEBRIDEMENT;  Surgeon: Joshua Alm RAMAN, MD;  Location: [facility] OR;  Service: Neurosurgery;  Laterality: N/A;   PERIPHERAL VASCULAR INTERVENTION  08/07/2021   Procedure: PERIPHERAL VASCULAR INTERVENTION;  Surgeon: Sheree Penne Bruckner, MD;  Location: [facility] INVASIVE CV LAB;  Service: Cardiovascular;;  Rt SFA  PERIPHERAL VASCULAR INTERVENTION Right 10/23/2021   Procedure: PERIPHERAL VASCULAR INTERVENTION;  Surgeon: Sheree Penne Bruckner, MD;  Location: [facility] INVASIVE CV LAB;  Service: Cardiovascular;  Laterality: Right;  SFA   POSTERIOR LUMBAR FUSION 4 LEVEL N/A 04/25/2020   Procedure: POSTERIOR LUMBAR INTERBODY FUSION LUMBAR ONE-TWO, LUMBAR TWO-THREE, LUMBAR THREE-FOUR,LUMBAR FOUR-FIVE.;  Surgeon: Joshua Alm RAMAN, MD;  Location: [facility] OR;  Service: Neurosurgery;  Laterality: N/A;  posterior   SHOULDER ARTHROSCOPY Left 05/08/2022   Procedure: ARTHROSCOPY SHOULDER;  Surgeon: Beverley Evalene BIRCH, MD;  Location: [facility] ORS;  Service: Orthopedics;  Laterality: Left;   TONSILLECTOMY     TOTAL ABDOMINAL HYSTERECTOMY W/ BILATERAL SALPINGOOPHORECTOMY Bilateral 2000   TOTAL HIP ARTHROPLASTY Right 08/22/2018   Procedure: TOTAL HIP ARTHROPLASTY;  Surgeon: Shari Sieving, MD;  Location: [facility] ORS;  Service: Orthopedics;  Laterality: Right;   TOTAL HIP ARTHROPLASTY Left 07/16/2023   Procedure: ARTHROPLASTY, HIP, TOTAL, ANTERIOR APPROACH;   Surgeon: Beverley Evalene BIRCH, MD;  Location: [facility] ORS;  Service: Orthopedics;  Laterality: Left;   TRANSCAROTID ARTERY REVASCULARIZATION  Left 08/07/2022   Procedure: Transcarotid Artery Revascularization;  Surgeon: Sheree Penne Bruckner, MD;  Location: [facility] OR;  Service: Vascular;  Laterality: Left;   ULTRASOUND GUIDANCE FOR VASCULAR ACCESS Left 08/07/2022   Procedure: ULTRASOUND GUIDANCE FOR VASCULAR ACCESS;  Surgeon: Sheree Penne Bruckner, MD;  Location: [facility] OR;  Service: Vascular;  Laterality: Left;   VARICOSE VEIN SURGERY  2008   stripping       Review of Systems  Constitutional:  Negative for chills, diaphoresis, fatigue and fever.  Respiratory:  Negative for cough, chest tightness, shortness of breath and wheezing.   Cardiovascular:  Negative for chest pain.  Gastrointestinal:  Negative for abdominal pain, diarrhea, nausea and vomiting.    Objective:   BP (!) 188/72   Pulse 69   Temp 97.7 F (36.5 C) (Temporal)   Resp 16   Wt 122 lb (55.3 kg)   SpO2 100%   BMI 21.61 kg/m  Nursing note and vital signs reviewed.  Physical Exam Constitutional:      General: She is not in acute distress.    Appearance: She is well-developed.  Cardiovascular:     Rate and Rhythm: Normal rate and regular rhythm.     Heart sounds: Normal heart sounds.  Pulmonary:     Effort: Pulmonary effort is normal.     Breath sounds: Normal breath sounds.  Skin:    General: Skin is warm and dry.  Neurological:     Mental Status: She is alert and oriented to person, place, and time.     Labs: Lab Results  Component Value Date   WBC 11.5 (H) 11/20/2023   HGB 7.7 (L) 11/20/2023   HCT 24.2 (L) 11/20/2023   MCV 79.9 (L) 11/20/2023   PLT 192 11/20/2023   Last metabolic panel Lab Results  Component Value Date   GLUCOSE 124 (H) 11/14/2023   NA 133 (L) 11/14/2023   K 3.6 11/14/2023   CL 99 11/14/2023   CO2 27 11/14/2023   BUN 13 11/14/2023   CREATININE 1.09 (H) 11/14/2023   GFRNONAA >60  09/12/2023   CALCIUM  9.1 11/14/2023   PHOS 3.8 02/26/2011   PROT 8.8 (H) 11/14/2023   ALBUMIN  2.4 (L) 09/11/2023   LABGLOB 4.9 (H) 08/21/2021   AGRATIO 1.5 08/04/2019   BILITOT 0.6 11/14/2023   ALKPHOS 93 09/11/2023   AST 12 11/14/2023   ALT 4 (L) 11/14/2023   ANIONGAP 8 09/12/2023    Assessment &  Plan:     Imaging: Reviewed  10/09/23 abd pelv ct Percutaneous drainage catheter in the right iliopsoas region, with no residual fluid collection at this site.   No acute findings.   Stable lucency surrounding the lumbar and bilateral sacroiliac joint fusion hardware, consistent with loosening or chronic infection.   09/06/23 mir lumbar spine 1. Operative changes of posterior decompression and fusion from L1 through the sacrum. Interbody fusion at L1-L2, L2-L3, L3-L4, and L4-L5. Abutment metal hardware artifact obscures portions of the canal and posterior soft tissues. 2. Fluid collection in the right psoas muscle measuring up to 9-10 cm in craniocaudal dimension. There is also a prevertebral fluid collection at L4-L5. Findings may represent postoperative hematomas or seromas. Additional fluid collection in the superficial posterior soft tissue at the level of L1, may also represent a postoperative seroma or hematoma. Superimposed infections in the collections is not excluded on imaging. 3. 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    Problem List Items Addressed This Visit     Hardware complicating wound infection   Vertebral osteomyelitis (HCC) - Primary   Relevant Orders   CBC   COMPLETE METABOLIC PANEL WITHOUT GFR      #vertebral osteomyelitis #psoas abscess s/p drainage and resolved on 10/09/23 ct scan #hardware complicating wound infection   11/14/23 patient complains of burning in the mouth and weird tastes and tingling in her hands since she takes the bactrim  She also reports vaginal itching as  well for a few days  She was changed from bactrim  to linezolid  recently because bactrim  was causing diarrhea/poor appetite -- nausea  She was transitioned to linezolid  9/09   -fluconazole  200 mg once, and can repeat in 3 days if still symptomatic with vaginitis -side effect to bactrim ; unfortunately 09/12/23 mrsa on cx not sensitive to doxycycline -will try tedezolid 200 mg once aday -once she is in her 6 months of abx from 09/12/23 (which is 02/2024) we can try linezolid  600 mg po daily which is a lower dose and might be fine for suppressive purpose   -f/u in 2-3 weeks   12/10/23 id assessment Tolerting tedezolid -- she does noticed fingers tingling since tedezolid No abx side effect No back pain Eating well good appetite No f/c No malaise No visual blurriness  ?possible tingling from tedizolid (she does have dm2); will keep on tedezolid for now Labs today  F/u 3 months   Follow-up: Return in about 3 months (around 03/11/2024). or sooner if needed.         Constance ONEIDA Passer, MD [facility] for Infectious Disease [facility] [phone]  pager   [phone] cell 12/10/2023, 11:23 AM

## 2023-12-10 NOTE — Patient Instructions (Signed)
 Continue tedezolid  If you have blurry vision, let me know right away and stop tedezolid  Follow up with me in 3 months. If you do well then will do 6 months

## 2023-12-11 ENCOUNTER — Encounter: Payer: Self-pay | Admitting: Physician Assistant

## 2023-12-11 ENCOUNTER — Ambulatory Visit (INDEPENDENT_AMBULATORY_CARE_PROVIDER_SITE_OTHER): Admitting: Physician Assistant

## 2023-12-11 ENCOUNTER — Ambulatory Visit (HOSPITAL_BASED_OUTPATIENT_CLINIC_OR_DEPARTMENT_OTHER)
Admission: RE | Admit: 2023-12-11 | Discharge: 2023-12-11 | Disposition: A | Discharge status: Home / Self Care | Source: Ambulatory Visit | Attending: Vascular Surgery | Admitting: Vascular Surgery

## 2023-12-11 ENCOUNTER — Ambulatory Visit (HOSPITAL_COMMUNITY)
Admission: RE | Admit: 2023-12-11 | Discharge: 2023-12-11 | Disposition: A | Discharge status: Home / Self Care | Source: Ambulatory Visit | Attending: Vascular Surgery | Admitting: Vascular Surgery

## 2023-12-11 VITALS — BP 168/72 | HR 68 | Temp 97.7°F | Wt 120.9 lb

## 2023-12-11 DIAGNOSIS — I739 Peripheral vascular disease, unspecified: Secondary | ICD-10-CM | POA: Diagnosis present

## 2023-12-11 DIAGNOSIS — M79604 Pain in right leg: Secondary | ICD-10-CM | POA: Diagnosis present

## 2023-12-11 DIAGNOSIS — I6523 Occlusion and stenosis of bilateral carotid arteries: Secondary | ICD-10-CM

## 2023-12-11 LAB — COMPLETE METABOLIC PANEL WITHOUT GFR
AG Ratio: 0.6 (calc) — ABNORMAL LOW (ref 1.0–2.5)
ALT: 3 U/L — ABNORMAL LOW (ref 6–29)
AST: 12 U/L (ref 10–35)
Albumin: 3.4 g/dL — ABNORMAL LOW (ref 3.6–5.1)
Alkaline phosphatase (APISO): 105 U/L (ref 37–153)
BUN/Creatinine Ratio: 17 (calc) (ref 6–22)
BUN: 18 mg/dL (ref 7–25)
CO2: 27 mmol/L (ref 20–32)
Calcium: 9 mg/dL (ref 8.6–10.4)
Chloride: 103 mmol/L (ref 98–110)
Creat: 1.03 mg/dL — ABNORMAL HIGH (ref 0.60–1.00)
Globulin: 5.3 g/dL — ABNORMAL HIGH (ref 1.9–3.7)
Glucose, Bld: 114 mg/dL — ABNORMAL HIGH (ref 65–99)
Potassium: 3.5 mmol/L (ref 3.5–5.3)
Sodium: 138 mmol/L (ref 135–146)
Total Bilirubin: 0.4 mg/dL (ref 0.2–1.2)
Total Protein: 8.7 g/dL — ABNORMAL HIGH (ref 6.1–8.1)

## 2023-12-11 LAB — CBC
HCT: 30.1 % — ABNORMAL LOW (ref 35.0–45.0)
Hemoglobin: 9.3 g/dL — ABNORMAL LOW (ref 11.7–15.5)
MCH: 26.1 pg — ABNORMAL LOW (ref 27.0–33.0)
MCHC: 30.9 g/dL — ABNORMAL LOW (ref 32.0–36.0)
MCV: 84.3 fL (ref 80.0–100.0)
MPV: 9.5 fL (ref 7.5–12.5)
Platelets: 354 Thousand/uL (ref 140–400)
RBC: 3.57 Million/uL — ABNORMAL LOW (ref 3.80–5.10)
RDW: 20.8 % — ABNORMAL HIGH (ref 11.0–15.0)
WBC: 7.9 Thousand/uL (ref 3.8–10.8)

## 2023-12-11 LAB — VAS US ABI WITH/WO TBI
Left ABI: 1.14
Right ABI: 0.93

## 2023-12-11 NOTE — Progress Notes (Signed)
 Office Note   History of Present Illness   April Shaffer is a 73 y.o. ([birth year]) female who presents for follow-up.  She recently underwent right lower extremity angiogram with right SFA laser atherectomy and drug-coated balloon angioplasty on 11/04/2023 by Dr. Sheree.  This was done for an occluded right SFA stent resulting in rest pain.  She also has a history of left TCAR by Dr. Sheree on 08/07/2022.  She has a known right carotid artery occlusion.  She returns today for follow-up.  She says that her right foot rest pain has completely gone away after the procedure.  She denies any claudication or rest pain in the left lower extremity.  She is bothered by new onset right lower extremity swelling that started shortly after her angiography.  She is wearing compression stockings and elevating her right leg when she can, however the foot and lower leg will persistently swell when she is standing, sitting, or moving around.  This causes her a little bit of discomfort.  She is taking her daily aspirin , Plavix , and statin  Current Outpatient Medications  Medication Sig Dispense Refill   acetaminophen  (TYLENOL ) 500 MG tablet Take 500 mg by mouth every 6 (six) hours as needed for mild pain (pain score 1-3), moderate pain (pain score 4-6) or headache.     amLODipine  (NORVASC ) 10 MG tablet Take 10 mg by mouth in the morning.     aspirin  EC 81 MG tablet Take 1 tablet (81 mg total) by mouth 2 (two) times daily. To prevent blood clots for 30 days after surgery. (Patient taking differently: Take 81 mg by mouth daily.) 60 tablet 0   carvedilol  (COREG  CR) 10 MG 24 hr capsule Take 10 mg by mouth in the morning.     clopidogrel  (PLAVIX ) 75 MG tablet Take 75 mg by mouth in the morning.     cyanocobalamin  (VITAMIN B12) 1000 MCG/ML injection Inject 1,000 mcg into the muscle every 30 (thirty) days.     ezetimibe  (ZETIA ) 10 MG tablet TAKE 1 TABLET EVERY DAY (NEED MD APPOINTMENT) (Patient taking differently: Take 10  mg by mouth daily.) 90 tablet 0   ferrous sulfate  325 (65 FE) MG tablet Take 325 mg by mouth 3 (three) times a week.     fluconazole  (DIFLUCAN ) 200 MG tablet Take 1 tablet and may repeat in 3 days if still having vaginal itching 2 tablet 0   gabapentin  (NEURONTIN ) 300 MG capsule Take 300 mg by mouth in the morning, at noon, and at bedtime.     glucose blood test strip Check sugars twice daily 100 each 11   hydrochlorothiazide  (HYDRODIURIL ) 12.5 MG tablet Take 12.5 mg by mouth in the morning.     metFORMIN  (GLUCOPHAGE -XR) 750 MG 24 hr tablet Take 750 mg by mouth in the morning.     oxyCODONE -acetaminophen  (PERCOCET) 10-325 MG tablet Take 1 tablet by mouth every 4 (four) hours as needed for pain. 40 tablet 0   sodium chloride  flush 0.9 % SOLN injection Flush with 5-10mL of saline daily to prevent clogging 200 mL 0   Tedizolid Phosphate  200 MG TABS Take 1 tablet (200 mg total) by mouth daily. 30 tablet 1   No current facility-administered medications for this visit.    REVIEW OF SYSTEMS (negative unless checked):   Cardiac:  []  Chest pain or chest pressure? []  Shortness of breath upon activity? []  Shortness of breath when lying flat? []  Irregular heart rhythm?  Vascular:  []  Pain in  calf, thigh, or hip brought on by walking? []  Pain in feet at night that wakes you up from your sleep? []  Blood clot in your veins? []  Leg swelling?  Pulmonary:  []  Oxygen at home? []  Productive cough? []  Wheezing?  Neurologic:  []  Sudden weakness in arms or legs? []  Sudden numbness in arms or legs? []  Sudden onset of difficult speaking or slurred speech? []  Temporary loss of vision in one eye? []  Problems with dizziness?  Gastrointestinal:  []  Blood in stool? []  Vomited blood?  Genitourinary:  []  Burning when urinating? []  Blood in urine?  Psychiatric:  []  Major depression  Hematologic:  []  Bleeding problems? []  Problems with blood clotting?  Dermatologic:  []  Rashes or  ulcers?  Constitutional:  []  Fever or chills?  Ear/Nose/Throat:  []  Change in hearing? []  Nose bleeds? []  Sore throat?  Musculoskeletal:  []  Back pain? []  Joint pain? []  Muscle pain?   Physical Examination   Vitals:   12/11/23 1433  BP: (!) 168/72  Pulse: 68  Temp: 97.7 F (36.5 C)  TempSrc: Temporal  Weight: 120 lb 14.4 oz (54.8 kg)   There is no height or weight on file to calculate BMI.  General:  WDWN in NAD; vital signs documented above Gait: Not observed HENT: WNL, normocephalic Pulmonary: normal non-labored breathing , without rales, rhonchi,  wheezing Cardiac: Regular Abdomen: soft, NT, no masses Skin: without rashes Vascular Exam/Pulses: Palpable right DP pulse Extremities: without ischemic changes, without gangrene , without cellulitis; without open wounds; 1+ pitting edema of right ankle and right lower leg Musculoskeletal: no muscle wasting or atrophy  Neurologic: A&O X 3;  No focal weakness or paresthesias are detected Psychiatric:  The pt has Normal affect.  Non-Invasive Vascular imaging   ABI (12/11/2023) R:  ABI: 0.93 (0.53),  PT: mono DP: mono TBI: 0.4 L:  ABI: 1.14 ([place]),  PT: mono DP: mono TBI: 0.370.37   RLE Arterial Duplex (12/11/2023) Patent right SFA stent without stenosis  Medical Decision Making   April Shaffer is a 73 y.o. female who presents for post angio follow-up  Based on the patient's vascular studies, her ABIs on the right had significantly improved after angiography from 0.53 to 0.93.  Her ABIs on the left are 1.14 Arterial duplex demonstrates a patent right SFA stent without stenosis Her right foot is feeling much better after recent angiography and SFA recanalization. She denies any rest pain, claudication or tissue loss.  She is having revascularization swelling in the right lower extremity.  She is wearing compression stockings and elevating her leg when she can. On exam she has brisk Doppler signals in the  left foot.  She has a palpable right DP.  She has 1+ edema of the right lower leg and ankle. I have reassured the patient that her swelling is likely due to recent revascularization and should improve with time.  She will continue compression stockings and leg elevations. She will continue her aspirin , Plavix , and Zetia .  She can follow-up with our office in 6 months with repeat right lower extremity arterial duplex, ABIs, and carotid duplex   Ahmed Holster PA-C [facility] Office: [phone]  Clinic MD: [place]

## 2023-12-12 ENCOUNTER — Other Ambulatory Visit: Payer: Self-pay | Admitting: *Deleted

## 2023-12-12 DIAGNOSIS — I739 Peripheral vascular disease, unspecified: Secondary | ICD-10-CM

## 2023-12-12 DIAGNOSIS — I70221 Atherosclerosis of native arteries of extremities with rest pain, right leg: Secondary | ICD-10-CM

## 2023-12-12 DIAGNOSIS — I6523 Occlusion and stenosis of bilateral carotid arteries: Secondary | ICD-10-CM

## 2023-12-13 ENCOUNTER — Ambulatory Visit: Payer: Self-pay | Admitting: Internal Medicine

## 2023-12-13 ENCOUNTER — Ambulatory Visit (INDEPENDENT_AMBULATORY_CARE_PROVIDER_SITE_OTHER)

## 2023-12-13 VITALS — BP 131/65 | HR 60 | Temp 97.9°F | Resp 18 | Ht 63.0 in | Wt 120.8 lb

## 2023-12-13 DIAGNOSIS — D509 Iron deficiency anemia, unspecified: Secondary | ICD-10-CM | POA: Diagnosis not present

## 2023-12-13 DIAGNOSIS — D500 Iron deficiency anemia secondary to blood loss (chronic): Secondary | ICD-10-CM

## 2023-12-13 MED ORDER — ACETAMINOPHEN 325 MG PO TABS
650.0000 mg | ORAL_TABLET | Freq: Once | ORAL | Status: AC
  Administered 2023-12-13: 650 mg via ORAL
  Filled 2023-12-13: qty 2

## 2023-12-13 MED ORDER — IRON SUCROSE 20 MG/ML IV SOLN
200.0000 mg | Freq: Once | INTRAVENOUS | Status: AC
  Administered 2023-12-13: 200 mg via INTRAVENOUS
  Filled 2023-12-13: qty 10

## 2023-12-13 MED ORDER — DIPHENHYDRAMINE HCL 25 MG PO CAPS
25.0000 mg | ORAL_CAPSULE | Freq: Once | ORAL | Status: AC
  Administered 2023-12-13: 25 mg via ORAL
  Filled 2023-12-13: qty 1

## 2023-12-13 NOTE — Progress Notes (Signed)
 Diagnosis: Iron Deficiency Anemia  Provider:  Praveen Mannam MD  Procedure: IV Push  IV Type: Peripheral, IV Location: R Antecubital  Venofer (Iron Sucrose), Dose: 200 mg  Post Infusion IV Care: Observation period completed and Peripheral IV Discontinued  Discharge: Condition: Good, Destination: Home . AVS Provided  Performed by:  Waddell LOISE Freshwater, RN

## 2023-12-18 ENCOUNTER — Inpatient Hospital Stay

## 2023-12-18 ENCOUNTER — Inpatient Hospital Stay: Attending: Nurse Practitioner

## 2023-12-18 ENCOUNTER — Inpatient Hospital Stay (HOSPITAL_BASED_OUTPATIENT_CLINIC_OR_DEPARTMENT_OTHER): Admitting: Hematology

## 2023-12-18 VITALS — BP 134/54 | HR 68 | Temp 97.8°F | Resp 19 | Ht 63.0 in | Wt 125.5 lb

## 2023-12-18 DIAGNOSIS — D638 Anemia in other chronic diseases classified elsewhere: Secondary | ICD-10-CM

## 2023-12-18 DIAGNOSIS — Z79899 Other long term (current) drug therapy: Secondary | ICD-10-CM | POA: Diagnosis not present

## 2023-12-18 DIAGNOSIS — D631 Anemia in chronic kidney disease: Secondary | ICD-10-CM | POA: Diagnosis not present

## 2023-12-18 DIAGNOSIS — E538 Deficiency of other specified B group vitamins: Secondary | ICD-10-CM

## 2023-12-18 DIAGNOSIS — D508 Other iron deficiency anemias: Secondary | ICD-10-CM | POA: Diagnosis present

## 2023-12-18 DIAGNOSIS — E1122 Type 2 diabetes mellitus with diabetic chronic kidney disease: Secondary | ICD-10-CM | POA: Diagnosis not present

## 2023-12-18 DIAGNOSIS — I129 Hypertensive chronic kidney disease with stage 1 through stage 4 chronic kidney disease, or unspecified chronic kidney disease: Secondary | ICD-10-CM | POA: Insufficient documentation

## 2023-12-18 DIAGNOSIS — D500 Iron deficiency anemia secondary to blood loss (chronic): Secondary | ICD-10-CM

## 2023-12-18 DIAGNOSIS — N1831 Chronic kidney disease, stage 3a: Secondary | ICD-10-CM | POA: Diagnosis not present

## 2023-12-18 DIAGNOSIS — D649 Anemia, unspecified: Secondary | ICD-10-CM

## 2023-12-18 DIAGNOSIS — Z7982 Long term (current) use of aspirin: Secondary | ICD-10-CM | POA: Insufficient documentation

## 2023-12-18 LAB — SAMPLE TO BLOOD BANK

## 2023-12-18 LAB — CBC WITH DIFFERENTIAL (CANCER CENTER ONLY)
Abs Immature Granulocytes: 0.02 K/uL (ref 0.00–0.07)
Basophils Absolute: 0 K/uL (ref 0.0–0.1)
Basophils Relative: 1 %
Eosinophils Absolute: 0.1 K/uL (ref 0.0–0.5)
Eosinophils Relative: 2 %
HCT: 29.3 % — ABNORMAL LOW (ref 36.0–46.0)
Hemoglobin: 9.1 g/dL — ABNORMAL LOW (ref 12.0–15.0)
Immature Granulocytes: 0 %
Lymphocytes Relative: 28 %
Lymphs Abs: 1.9 K/uL (ref 0.7–4.0)
MCH: 26.8 pg (ref 26.0–34.0)
MCHC: 31.1 g/dL (ref 30.0–36.0)
MCV: 86.4 fL (ref 80.0–100.0)
Monocytes Absolute: 0.6 K/uL (ref 0.1–1.0)
Monocytes Relative: 9 %
Neutro Abs: 3.9 K/uL (ref 1.7–7.7)
Neutrophils Relative %: 60 %
Platelet Count: 262 K/uL (ref 150–400)
RBC: 3.39 MIL/uL — ABNORMAL LOW (ref 3.87–5.11)
RDW: 24.8 % — ABNORMAL HIGH (ref 11.5–15.5)
WBC Count: 6.6 K/uL (ref 4.0–10.5)
nRBC: 0 % (ref 0.0–0.2)

## 2023-12-18 LAB — IRON AND IRON BINDING CAPACITY (CC-WL,HP ONLY)
Iron: 59 ug/dL (ref 28–170)
Saturation Ratios: 22 % (ref 10.4–31.8)
TIBC: 269 ug/dL (ref 250–450)
UIBC: 210 ug/dL (ref 148–442)

## 2023-12-18 LAB — FERRITIN: Ferritin: 662 ng/mL — ABNORMAL HIGH (ref 11–307)

## 2023-12-18 LAB — VITAMIN B12: Vitamin B-12: 513 pg/mL (ref 180–914)

## 2023-12-18 MED ORDER — CYANOCOBALAMIN 1000 MCG/ML IJ SOLN
1000.0000 ug | Freq: Once | INTRAMUSCULAR | Status: AC
  Administered 2023-12-18: 1000 ug via INTRAMUSCULAR
  Filled 2023-12-18: qty 1

## 2023-12-18 NOTE — Assessment & Plan Note (Signed)
-  previous lab showed low B12 and elevated MMA which confirms B12 deficiency.   -intrinsic factor from 08/31/21 was normal  -she began B12 injections on 08/31/21, weekly through 10/11/21 and now on monthly. Last dose in 12/2021 -She had a multiple hospital admission since January 2024, and missed her B12 injections, her anemia got worse, but improved again after resume B12 injections

## 2023-12-18 NOTE — Progress Notes (Signed)
 [facility]   [phone] [phone]   Clinic Follow up Note   Patient Care Team: Arloa Jarvis, NP as PCP - General (Nurse Practitioner) Court Dorn PARAS, MD as PCP - Cardiology (Cardiology) Shari Sieving, MD as Consulting Physician (Orthopedic Surgery) Lanny Callander, MD as Consulting Physician (Hematology) Burton, Lacie K, NP as Nurse Practitioner (Nurse Practitioner) Claudene Prentice DELENA Mickey., FNP as Nurse Practitioner (Family Medicine) Joshua Alm Hamilton, MD as Consulting Physician (Neurosurgery)  Date of Service:  12/18/2023  CHIEF COMPLAINT: f/u of anemia and B12 deficiency  CURRENT THERAPY:  B12 injection monthly IV iron  if ferritin less than 200 or iron  saturation <20%  Oncology History   B12 deficiency -previous lab showed low B12 and elevated MMA which confirms B12 deficiency.   -intrinsic factor from 08/31/21 was normal  -she began B12 injections on 08/31/21, weekly through 10/11/21 and now on monthly. Last dose in 12/2021 -She had a multiple hospital admission since January 2024, and missed her B12 injections, her anemia got worse, but improved again after resume B12 injections   Anemia of chronic disease -iron  panel 08/01/21 indicative of anemia of chronic disease rather than true iron  deficiency  --iron  on 12/06/21 was 19. She received 3 doses IV Venofer  from 12/22/21.  She responded well, hemoglobin improved to 11.8 after IV iron . -she received iv iron  in 11/2023  Assessment & Plan Anemia of chronic disease (secondary to chronic kidney disease and spinal infection) Anemia has improved with IV iron  therapy, with hemoglobin increasing from 7.7 to 9.3. The anemia is likely multifactorial, related to chronic kidney disease and spinal infection. Cold fingertips may be due to anemia. No blood transfusion is needed at this time. - Continue periodic monitoring of hemoglobin levels. - Administer IV iron  if ferritin or iron  saturation is low with  anemia - Consider erythropoiesis-stimulating agent if anemia worsens despite adequate iron  levels.  Chronic kidney disease, stage IIIA Chronic kidney disease is contributing to anemia. - Ongoing monitoring and management.  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 The medication is expensive but currently covered by insurance. - Continue PLM as prescribed. - Monitor for signs of infection recurrence.  Vitamin B12 deficiency Vitamin B12 deficiency is being managed with monthly B12 injections. - Continue monthly B12 injections. - Monitor B12 levels with monthly lab tests.   Plan - Labs reviewed, anemia improved after IV iron  - Will proceed B12 injection today and continue every months - Labs monthly, as needed as needed - Follow-up in 3 months   Discussed the use of AI scribe software for clinical note transcription with the patient, who gave verbal consent to proceed.  History of Present Illness April Shaffer is a 73 year old female with anemia who presents for follow-up.  She experiences persistent coldness in her fingertips, which is not painful. Her anemia has improved with IV iron  therapy, with hemoglobin levels rising from 7.7 to 9.3.  She has chronic kidney disease and receives monthly B12 injections. She is on a medication for recurrent infections, which is well-tolerated and covered by insurance. She takes oxycodone  as needed for back pain and is under the care of another physician for a spinal infection.     All other systems were reviewed with the patient and are negative.  MEDICAL HISTORY:  Past Medical History:  Diagnosis Date   Anemia    Arthritis    Blood  transfusion without reported diagnosis    Carotid artery narrowing    Coronary artery disease    Cardiac catheterization November 2013: 50% ostial LAD stenosis 50% mid stenosis. 30%  disease in the left circumflex.   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   Sleep apnea       Had surgery to correct   Thyroid  nodule     SURGICAL HISTORY: Past Surgical History:  Procedure Laterality Date   ABDOMINAL AORTOGRAM N/A 11/04/2023   Procedure: ABDOMINAL AORTOGRAM;  Surgeon: Sheree Penne Bruckner, MD;  Location: [facility] INVASIVE CV LAB;  Service: Cardiovascular;  Laterality: N/A;   ABDOMINAL AORTOGRAM W/LOWER EXTREMITY N/A 08/07/2021   Procedure: ABDOMINAL AORTOGRAM W/LOWER EXTREMITY;  Surgeon: Sheree Penne Bruckner, MD;  Location: [facility] INVASIVE CV LAB;  Service: Cardiovascular;  Laterality: N/A;   ABDOMINAL AORTOGRAM W/LOWER EXTREMITY Right 10/23/2021   Procedure: ABDOMINAL AORTOGRAM W/LOWER EXTREMITY;  Surgeon: Sheree Penne Bruckner, MD;  Location: [facility] INVASIVE CV LAB;  Service: Cardiovascular;  Laterality: Right;   ABDOMINAL HYSTERECTOMY     APPLICATION OF INTRAOPERATIVE CT SCAN N/A 11/30/2020   Procedure: APPLICATION OF INTRAOPERATIVE CT SCAN;  Surgeon: Joshua Alm RAMAN, MD;  Location: [facility] OR;  Service: Neurosurgery;  Laterality: N/A;   BACK SURGERY     CARDIAC CATHETERIZATION     CATARACT EXTRACTION, BILATERAL Bilateral 2021   Dr. Milissa   ENDARTERECTOMY  09/27/2011   Procedure: RIGT ENDARTERECTOMY CAROTID;  Surgeon: Lynwood JONETTA Collum, MD;  Location: [facility] OR;  Service: Vascular;  Laterality: Right;   EPIDURAL BLOCK INJECTION  02/2008   Drs. Clydell Mince   EYE SURGERY Bilateral 2021   cataract   gyn surgery  2004   total hysterectomy for mennorhagia,,salpingoophorectomy   INCISION AND DRAINAGE ABSCESS Left 05/08/2022   Procedure: ARTHROSCOPIC INCISION AND DRAINAGE ABSCESS, DISTAL CLAVICLE EXCISSION, SUBACROMIAL DECOMPRESSION, LOOSE BODY REMOVAL, EXTENSIVE DEBRIDEMENT;  Surgeon: Beverley Evalene JONETTA, MD;  Location: [facility] ORS;   Service: Orthopedics;  Laterality: Left;   IR RADIOLOGIST EVAL & MGMT  10/09/2023   LAMINECTOMY WITH POSTERIOR LATERAL ARTHRODESIS LEVEL 2 N/A 11/30/2020   Procedure: LUMBAR FOUR-FIVE, LUMBAR FIVE-SACRAL ONE POSTERIOR LATERAL FUSION WITH REVISION OF LUMBAR ONE-FIVE HARDWARE AND EXTENSION TO SACRAL ONE AND SACRAL TWO;  Surgeon: Joshua Alm RAMAN, MD;  Location: [facility] OR;  Service: Neurosurgery;  Laterality: N/A;   LOWER EXTREMITY ANGIOGRAPHY N/A 11/04/2023   Procedure: Lower Extremity Angiography;  Surgeon: Sheree Penne Bruckner, MD;  Location: [facility] INVASIVE CV LAB;  Service: Cardiovascular;  Laterality: N/A;   LOWER EXTREMITY INTERVENTION Right 11/04/2023   Procedure: LOWER EXTREMITY INTERVENTION;  Surgeon: Sheree Penne Bruckner, MD;  Location: [facility] INVASIVE CV LAB;  Service: Cardiovascular;  Laterality: Right;   LUMBAR WOUND DEBRIDEMENT N/A 05/25/2020   Procedure: LUMBAR WOUND IRRIGATION AND DEBRIDEMENT;  Surgeon: Joshua Alm RAMAN, MD;  Location: [facility] OR;  Service: Neurosurgery;  Laterality: N/A;   PERIPHERAL VASCULAR INTERVENTION  08/07/2021   Procedure: PERIPHERAL VASCULAR INTERVENTION;  Surgeon: Sheree Penne Bruckner, MD;  Location: [facility] INVASIVE CV LAB;  Service: Cardiovascular;;  Rt SFA   PERIPHERAL VASCULAR INTERVENTION Right 10/23/2021   Procedure: PERIPHERAL VASCULAR INTERVENTION;  Surgeon: Sheree Penne Bruckner, MD;  Location: [facility] INVASIVE CV LAB;  Service: Cardiovascular;  Laterality: Right;  SFA   POSTERIOR LUMBAR FUSION 4 LEVEL N/A 04/25/2020   Procedure: POSTERIOR LUMBAR INTERBODY FUSION LUMBAR ONE-TWO,  LUMBAR TWO-THREE, LUMBAR THREE-FOUR,LUMBAR FOUR-FIVE.;  Surgeon: Joshua Alm RAMAN, MD;  Location: [facility] OR;  Service: Neurosurgery;  Laterality: N/A;  posterior   SHOULDER ARTHROSCOPY Left 05/08/2022   Procedure: ARTHROSCOPY SHOULDER;  Surgeon: Beverley Evalene BIRCH, MD;  Location: [facility] ORS;  Service: Orthopedics;  Laterality: Left;   TONSILLECTOMY     TOTAL ABDOMINAL HYSTERECTOMY W/ BILATERAL  SALPINGOOPHORECTOMY Bilateral 2000   TOTAL HIP ARTHROPLASTY Right 08/22/2018   Procedure: TOTAL HIP ARTHROPLASTY;  Surgeon: Shari Sieving, MD;  Location: [facility] ORS;  Service: Orthopedics;  Laterality: Right;   TOTAL HIP ARTHROPLASTY Left 07/16/2023   Procedure: ARTHROPLASTY, HIP, TOTAL, ANTERIOR APPROACH;  Surgeon: Beverley Evalene BIRCH, MD;  Location: [facility] OR[facility];  Service: Orthopedics;  Laterality: Left;   TRANSCAROTID ARTERY REVASCULARIZATION  Left 08/07/2022   Procedure: Transcarotid Artery Revascularization;  Surgeon: Sheree Penne Bruckner, MD;  Location: [facility] OR;  Service: Vascular;  Laterality: Left;   ULTRASOUND GUIDANCE FOR VASCULAR ACCESS Left 08/07/2022   Procedure: ULTRASOUND GUIDANCE FOR VASCULAR ACCESS;  Surgeon: Sheree Penne Bruckner, MD;  Location: [facility] OR;  Service: Vascular;  Laterality: Left;   VARICOSE VEIN SURGERY  2008   stripping    I have reviewed the social history and family history with the patient and they are unchanged from previous note.  ALLERGIES:  is allergic to zestoretic  [lisinopril -hydrochlorothiazide ], codeine, oxycodone , ultram  [tramadol ], and zocor  [simvastatin ].  MEDICATIONS:  Current Outpatient Medications  Medication Sig Dispense Refill   acetaminophen  (TYLENOL ) 500 MG tablet Take 500 mg by mouth every 6 (six) hours as needed for mild pain (pain score 1-3), moderate pain (pain score 4-6) or headache.     amLODipine  (NORVASC ) 10 MG tablet Take 10 mg by mouth in the morning.     aspirin  EC 81 MG tablet Take 1 tablet (81 mg total) by mouth 2 (two) times daily. To prevent blood clots for 30 days after surgery. (Patient taking differently: Take 81 mg by mouth daily.) 60 tablet 0   carvedilol  (COREG  CR) 10 MG 24 hr capsule Take 10 mg by mouth in the morning.     clopidogrel  (PLAVIX ) 75 MG tablet Take 75 mg by mouth in the morning.     cyanocobalamin  (VITAMIN B12) 1000 MCG/ML injection Inject 1,000 mcg into the muscle every 30 (thirty) days.     ezetimibe  (ZETIA )  10 MG tablet TAKE 1 TABLET EVERY DAY (NEED MD APPOINTMENT) (Patient taking differently: Take 10 mg by mouth daily.) 90 tablet 0   ferrous sulfate  325 (65 FE) MG tablet Take 325 mg by mouth 3 (three) times a week.     fluconazole  (DIFLUCAN ) 200 MG tablet Take 1 tablet and may repeat in 3 days if still having vaginal itching 2 tablet 0   gabapentin  (NEURONTIN ) 300 MG capsule Take 300 mg by mouth in the morning, at noon, and at bedtime.     glucose blood test strip Check sugars twice daily 100 each 11   hydrochlorothiazide  (HYDRODIURIL ) 12.5 MG tablet Take 12.5 mg by mouth in the morning.     metFORMIN  (GLUCOPHAGE -XR) 750 MG 24 hr tablet Take 750 mg by mouth in the morning.     oxyCODONE -acetaminophen  (PERCOCET) 10-325 MG tablet Take 1 tablet by mouth every 4 (four) hours as needed for pain. 40 tablet 0   sodium chloride  flush 0.9 % SOLN injection Flush with 5-10mL of saline daily to prevent clogging 200 mL 0   Tedizolid Phosphate  200 MG TABS Take 1 tablet (200 mg total) by mouth daily. 30 tablet 1  No current facility-administered medications for this visit.    PHYSICAL EXAMINATION: ECOG PERFORMANCE STATUS: 1 - Symptomatic but completely ambulatory  Vitals:   12/18/23 1342  BP: (!) 134/54  Pulse: 68  Resp: 19  Temp: 97.8 F (36.6 C)  SpO2: 100%   Wt Readings from Last 3 Encounters:  12/18/23 125 lb 8 oz (56.9 kg)  12/13/23 120 lb 12.8 oz (54.8 kg)  12/11/23 120 lb 14.4 oz (54.8 kg)     GENERAL:alert, no distress and comfortable SKIN: skin color, texture, turgor are normal, no rashes or significant lesions Musculoskeletal:no cyanosis of digits and no clubbing  NEURO: alert & oriented x 3 with fluent speech, no focal motor/sensory deficits  Physical Exam    LABORATORY DATA:  I have reviewed the data as listed    Latest Ref Rng & Units 12/18/2023    1:20 PM 12/10/2023   11:35 AM 11/20/2023    1:21 PM  CBC  WBC 4.0 - 10.5 K/uL 6.6  7.9  11.5   Hemoglobin 12.0 - 15.0 g/dL  9.1  9.3  7.7   Hematocrit 36.0 - 46.0 % 29.3  30.1  24.2   Platelets 150 - 400 K/uL 262  354  192         Latest Ref Rng & Units 12/10/2023   11:35 AM 11/14/2023   11:40 AM 11/04/2023   10:34 AM  CMP  Glucose 65 - 99 mg/dL 885  875  79   BUN 7 - 25 mg/dL 18  13  21    Creatinine 0.60 - 1.00 mg/dL 8.96  8.90  8.59   Sodium 135 - 146 mmol/L 138  133  137   Potassium 3.5 - 5.3 mmol/L 3.5  3.6  3.8   Chloride 98 - 110 mmol/L 103  99  102   CO2 20 - 32 mmol/L 27  27    Calcium  8.6 - 10.4 mg/dL 9.0  9.1    Total Protein 6.1 - 8.1 g/dL 8.7  8.8    Total Bilirubin 0.2 - 1.2 mg/dL 0.4  0.6    AST 10 - 35 U/L 12  12    ALT 6 - 29 U/L 3  4        RADIOGRAPHIC STUDIES: I have personally reviewed the radiological images as listed and agreed with the findings in the report. No results found.    No orders of the defined types were placed in this encounter.  All questions were answered. The patient knows to call the clinic with any problems, questions or concerns. No barriers to learning was detected. The total time spent in the appointment was 20 minutes, including review of chart and various tests results, discussions about plan of care and coordination of care plan     Onita Mattock, MD 12/18/2023

## 2023-12-18 NOTE — Assessment & Plan Note (Signed)
-  iron  panel 08/01/21 indicative of anemia of chronic disease rather than true iron  deficiency  --iron  on 12/06/21 was 19. She received 3 doses IV Venofer  from 12/22/21.  She responded well, hemoglobin improved to 11.8 after IV iron . -she received iv iron  in 11/2023

## 2024-01-15 ENCOUNTER — Other Ambulatory Visit: Payer: Self-pay | Admitting: Internal Medicine

## 2024-01-15 NOTE — Telephone Encounter (Signed)
 Yes please

## 2024-01-15 NOTE — Telephone Encounter (Signed)
 She needs to have cbc proba ly evwry 3 to 6 months  Lets do 3 months yes

## 2024-01-15 NOTE — Telephone Encounter (Signed)
 Ok to refill

## 2024-01-16 ENCOUNTER — Inpatient Hospital Stay: Admitting: Hematology

## 2024-01-16 ENCOUNTER — Inpatient Hospital Stay

## 2024-01-16 ENCOUNTER — Telehealth: Payer: Self-pay

## 2024-01-16 NOTE — Telephone Encounter (Signed)
 Patient called to see if her medication was sent to the pharmacy. As of 01/15/24 the medication was sent to [facility] on [address].

## 2024-01-16 NOTE — Telephone Encounter (Signed)
 Spoke with pt via telephone regarding appts today.  Pt stated she called on 01/15/2024 and LVM for Scheduling cancelling her appt for today d/t being sick.  Stated this nurse will cancel the appts and have Dr. Demetra scheduler contact the pt to get her rescheduled.

## 2024-01-21 ENCOUNTER — Other Ambulatory Visit (HOSPITAL_COMMUNITY): Payer: Self-pay

## 2024-01-23 NOTE — Assessment & Plan Note (Deleted)
-  iron  panel 08/01/21 indicative of anemia of chronic disease rather than true iron  deficiency  --iron  on 12/06/21 was 19. She received 3 doses IV Venofer  from 12/22/21.  She responded well, hemoglobin improved to 11.8 after IV iron . -she received iv iron  in 11/2023

## 2024-01-23 NOTE — Assessment & Plan Note (Deleted)
-  previous lab showed low B12 and elevated MMA which confirms B12 deficiency.   -intrinsic factor from 08/31/21 was normal  -she began B12 injections on 08/31/21, weekly through 10/11/21 and now on monthly. Last dose in 12/2021 -She had a multiple hospital admission since January 2024, and missed her B12 injections, her anemia got worse, but improved again after resume B12 injections

## 2024-01-24 ENCOUNTER — Inpatient Hospital Stay: Attending: Nurse Practitioner

## 2024-01-24 ENCOUNTER — Other Ambulatory Visit: Payer: Self-pay

## 2024-01-24 ENCOUNTER — Inpatient Hospital Stay

## 2024-01-24 ENCOUNTER — Inpatient Hospital Stay: Admitting: Hematology

## 2024-01-24 VITALS — BP 173/77 | HR 79 | Resp 17

## 2024-01-24 DIAGNOSIS — D638 Anemia in other chronic diseases classified elsewhere: Secondary | ICD-10-CM

## 2024-01-24 DIAGNOSIS — D508 Other iron deficiency anemias: Secondary | ICD-10-CM | POA: Insufficient documentation

## 2024-01-24 DIAGNOSIS — E538 Deficiency of other specified B group vitamins: Secondary | ICD-10-CM | POA: Insufficient documentation

## 2024-01-24 DIAGNOSIS — D649 Anemia, unspecified: Secondary | ICD-10-CM

## 2024-01-24 DIAGNOSIS — D500 Iron deficiency anemia secondary to blood loss (chronic): Secondary | ICD-10-CM

## 2024-01-24 LAB — CBC WITH DIFFERENTIAL (CANCER CENTER ONLY)
Abs Immature Granulocytes: 0.03 K/uL (ref 0.00–0.07)
Basophils Absolute: 0 K/uL (ref 0.0–0.1)
Basophils Relative: 1 %
Eosinophils Absolute: 0.1 K/uL (ref 0.0–0.5)
Eosinophils Relative: 2 %
HCT: 36.4 % (ref 36.0–46.0)
Hemoglobin: 11.9 g/dL — ABNORMAL LOW (ref 12.0–15.0)
Immature Granulocytes: 0 %
Lymphocytes Relative: 24 %
Lymphs Abs: 2 K/uL (ref 0.7–4.0)
MCH: 29 pg (ref 26.0–34.0)
MCHC: 32.7 g/dL (ref 30.0–36.0)
MCV: 88.6 fL (ref 80.0–100.0)
Monocytes Absolute: 0.6 K/uL (ref 0.1–1.0)
Monocytes Relative: 7 %
Neutro Abs: 5.3 K/uL (ref 1.7–7.7)
Neutrophils Relative %: 66 %
Platelet Count: 229 K/uL (ref 150–400)
RBC: 4.11 MIL/uL (ref 3.87–5.11)
RDW: 20.9 % — ABNORMAL HIGH (ref 11.5–15.5)
WBC Count: 8 K/uL (ref 4.0–10.5)
nRBC: 0 % (ref 0.0–0.2)

## 2024-01-24 LAB — IRON AND IRON BINDING CAPACITY (CC-WL,HP ONLY)
Iron: 35 ug/dL (ref 28–170)
Saturation Ratios: 12 % (ref 10.4–31.8)
TIBC: 291 ug/dL (ref 250–450)
UIBC: 256 ug/dL

## 2024-01-24 LAB — SAMPLE TO BLOOD BANK

## 2024-01-24 LAB — FERRITIN: Ferritin: 278 ng/mL (ref 11–307)

## 2024-01-24 LAB — VITAMIN B12: Vitamin B-12: 679 pg/mL (ref 180–914)

## 2024-01-24 MED ORDER — CYANOCOBALAMIN 1000 MCG/ML IJ SOLN
1000.0000 ug | Freq: Once | INTRAMUSCULAR | Status: AC
  Administered 2024-01-24: 1000 ug via INTRAMUSCULAR
  Filled 2024-01-24: qty 1

## 2024-01-29 ENCOUNTER — Ambulatory Visit: Admitting: Podiatry

## 2024-01-29 ENCOUNTER — Encounter: Payer: Self-pay | Admitting: Podiatry

## 2024-01-29 DIAGNOSIS — I739 Peripheral vascular disease, unspecified: Secondary | ICD-10-CM

## 2024-01-29 DIAGNOSIS — M79675 Pain in left toe(s): Secondary | ICD-10-CM

## 2024-01-29 DIAGNOSIS — B351 Tinea unguium: Secondary | ICD-10-CM

## 2024-01-29 DIAGNOSIS — E1142 Type 2 diabetes mellitus with diabetic polyneuropathy: Secondary | ICD-10-CM | POA: Diagnosis not present

## 2024-01-29 DIAGNOSIS — M79674 Pain in right toe(s): Secondary | ICD-10-CM | POA: Diagnosis not present

## 2024-01-31 ENCOUNTER — Other Ambulatory Visit (HOSPITAL_COMMUNITY): Payer: Self-pay

## 2024-02-02 NOTE — Progress Notes (Signed)
  Subjective:  Patient ID: April Shaffer, female    DOB: May 23, 1950,  MRN: [id number]  April Shaffer presents to clinic today for at risk footcare. Patient has h/o diabetes, neuropathy and PAD and is seen for  and callus(es) left lower extremity and painful mycotic toenails that are difficult to trim. Painful toenails interfere with ambulation. Aggravating factors include wearing enclosed shoe gear. Pain is relieved with periodic professional debridement. Painful calluses are aggravated when weightbearing with and without shoegear. Pain is relieved with periodic professional debridement. Patient is appreciative of advice for her to see Vascular at her last visit. She ended up needing intervention of her RLE on 11/04/23. Chief Complaint  Patient presents with   [facility]    Rm17 Diabetic foot care/ A1C 6.7/ Dr. Daphne Lesches   New problem(s): None.   PCP is Lesches Daphne, NP.  Allergies  Allergen Reactions   Zestoretic  [Lisinopril -Hydrochlorothiazide ] Swelling and Other (See Comments)    Angioedema + Tongue swelling    Codeine Itching   Oxycodone  Itching and Other (See Comments)    Patient is taking in 2025   Ultram  [Tramadol ] Itching   Zocor  [Simvastatin ] Other (See Comments)    Muscle pain    Review of Systems: Negative except as noted in the HPI.  Objective:  There were no vitals filed for this visit. April Shaffer is a pleasant 73 y.o. female WD, WN in NAD. AAO x 3.  Vascular Examination: CFT <3 seconds BLE. Faintly palpable pedal pulses RLE. Pedal pulses LLE faintly palpable. Digital hair absent. Skin temperature gradient warm to cool b/l. No pain with calf compression. No unilateral edema RLE. No ischemia or gangrene. No cyanosis or clubbing noted b/l.    Neurological Examination: Protective sensation decreased with 10 gram monofilament b/l. Vibratory sensation decreased b/l.  Dermatological Examination: Pedal skin warm and supple b/l. No open wounds b/l. No interdigital  macerations. Toenails 1-5 b/l thick, discolored, elongated with subungual debris and pain on dorsal palpation. Minimal hyperkeratosis lesion(s) medial IPJ left great toe.  No erythema, no edema, no drainage, no fluctuance.     Musculoskeletal Examination: Muscle strength 5/5 to all lower extremity muscle groups bilaterally. No pain, crepitus or joint limitation noted with ROM bilateral LE.  Radiographs: None  Assessment/Plan: 1. Pain due to onychomycosis of toenails of both feet   2. PAD (peripheral artery disease)   3. DM type 2 with diabetic peripheral neuropathy [facility])    Patient with recent vascular intervention RLE under Dr. Sheree.  Consent given for treatment. Patient examined. All patient's and/or POA's questions/concerns addressed on today's visit. Toenails 1-5 b/l debrided in length and girth without incident. Continue foot and shoe inspections daily. Monitor blood glucose per PCP/Endocrinologist's recommendations. Continue soft, supportive shoe gear daily. Report any pedal injuries to medical professional. Call office if there are any questions/concerns.  Return in about 3 months (around 04/28/2024).  Delon LITTIE Merlin, DPM      [place] LOCATION: [address], [place] [address]                   Office [phone]   [facility] LOCATION: [address] [place], [place] [address] Office [phone]

## 2024-02-05 ENCOUNTER — Ambulatory Visit: Admitting: Podiatry

## 2024-02-10 NOTE — Addendum Note (Signed)
 Addended by: DAYNE SHERRY RAMAN on: 02/10/2024 12:25 PM   Modules accepted: Orders

## 2024-02-13 ENCOUNTER — Inpatient Hospital Stay

## 2024-02-13 ENCOUNTER — Inpatient Hospital Stay: Attending: Nurse Practitioner

## 2024-02-13 DIAGNOSIS — D638 Anemia in other chronic diseases classified elsewhere: Secondary | ICD-10-CM

## 2024-02-13 DIAGNOSIS — E538 Deficiency of other specified B group vitamins: Secondary | ICD-10-CM | POA: Insufficient documentation

## 2024-02-13 DIAGNOSIS — D500 Iron deficiency anemia secondary to blood loss (chronic): Secondary | ICD-10-CM

## 2024-02-13 DIAGNOSIS — D508 Other iron deficiency anemias: Secondary | ICD-10-CM | POA: Diagnosis present

## 2024-02-13 DIAGNOSIS — D649 Anemia, unspecified: Secondary | ICD-10-CM

## 2024-02-13 DIAGNOSIS — D509 Iron deficiency anemia, unspecified: Secondary | ICD-10-CM | POA: Diagnosis not present

## 2024-02-13 LAB — CBC WITH DIFFERENTIAL (CANCER CENTER ONLY)
Abs Immature Granulocytes: 0.08 K/uL — ABNORMAL HIGH (ref 0.00–0.07)
Basophils Absolute: 0.1 K/uL (ref 0.0–0.1)
Basophils Relative: 1 %
Eosinophils Absolute: 0.1 K/uL (ref 0.0–0.5)
Eosinophils Relative: 1 %
HCT: 35.8 % — ABNORMAL LOW (ref 36.0–46.0)
Hemoglobin: 11.7 g/dL — ABNORMAL LOW (ref 12.0–15.0)
Immature Granulocytes: 1 %
Lymphocytes Relative: 22 %
Lymphs Abs: 1.7 K/uL (ref 0.7–4.0)
MCH: 29.2 pg (ref 26.0–34.0)
MCHC: 32.7 g/dL (ref 30.0–36.0)
MCV: 89.3 fL (ref 80.0–100.0)
Monocytes Absolute: 0.6 K/uL (ref 0.1–1.0)
Monocytes Relative: 8 %
Neutro Abs: 5 K/uL (ref 1.7–7.7)
Neutrophils Relative %: 67 %
Platelet Count: 260 K/uL (ref 150–400)
RBC: 4.01 MIL/uL (ref 3.87–5.11)
RDW: 17.8 % — ABNORMAL HIGH (ref 11.5–15.5)
WBC Count: 7.4 K/uL (ref 4.0–10.5)
nRBC: 0 % (ref 0.0–0.2)

## 2024-02-13 LAB — VITAMIN B12: Vitamin B-12: 988 pg/mL — ABNORMAL HIGH (ref 180–914)

## 2024-02-13 LAB — FERRITIN: Ferritin: 258 ng/mL (ref 11–307)

## 2024-02-13 LAB — IRON AND IRON BINDING CAPACITY (CC-WL,HP ONLY)
Iron: 55 ug/dL (ref 28–170)
Saturation Ratios: 19 % (ref 10.4–31.8)
TIBC: 293 ug/dL (ref 250–450)
UIBC: 238 ug/dL

## 2024-02-13 LAB — SAMPLE TO BLOOD BANK

## 2024-02-13 MED ORDER — CYANOCOBALAMIN 1000 MCG/ML IJ SOLN
1000.0000 ug | Freq: Once | INTRAMUSCULAR | Status: AC
  Administered 2024-02-13: 14:00:00 1000 ug via INTRAMUSCULAR
  Filled 2024-02-13: qty 1

## 2024-02-13 NOTE — Progress Notes (Signed)
 OK to give B12 today (a little early) per Dr. Lanny.   Pt had resched appts back in Nov.  Wilma Dollar, Pharm.D., CPP 02/13/2024@1 :52 PM

## 2024-02-17 ENCOUNTER — Other Ambulatory Visit (HOSPITAL_COMMUNITY): Payer: Self-pay

## 2024-03-11 ENCOUNTER — Other Ambulatory Visit: Payer: Self-pay

## 2024-03-11 ENCOUNTER — Telehealth: Payer: Self-pay

## 2024-03-11 MED ORDER — FLUCONAZOLE 200 MG PO TABS: ORAL_TABLET | ORAL | 1 refills | Status: AC

## 2024-03-11 NOTE — Telephone Encounter (Signed)
 Fluconazole  sent in for the patient.  April Shaffer, CMA

## 2024-03-11 NOTE — Telephone Encounter (Signed)
 Patient called, reports symptoms of vaginal yeast infection and requests Rx be sent to [facility] on [address]   Emma Birchler, BSN, RN

## 2024-03-17 NOTE — Assessment & Plan Note (Signed)
-  iron  panel 08/01/21 indicative of anemia of chronic disease rather than true iron  deficiency  --iron  on 12/06/21 was 19. She received 3 doses IV Venofer  from 12/22/21.  She responded well, hemoglobin improved to 11.8 after IV iron . -she received iv iron  in 11/2023

## 2024-03-17 NOTE — Assessment & Plan Note (Signed)
-  previous lab showed low B12 and elevated MMA which confirms B12 deficiency.   -intrinsic factor from 08/31/21 was normal  -she began B12 injections on 08/31/21, weekly through 10/11/21 and now on monthly. Last dose in 12/2021 -She had a multiple hospital admission since January 2024, and missed her B12 injections, her anemia got worse, but improved again after resume B12 injections

## 2024-03-18 ENCOUNTER — Inpatient Hospital Stay

## 2024-03-18 ENCOUNTER — Inpatient Hospital Stay: Attending: Nurse Practitioner | Admitting: Hematology

## 2024-03-18 VITALS — BP 142/62 | HR 69 | Temp 99.7°F | Resp 19 | Ht 63.0 in | Wt 124.4 lb

## 2024-03-18 DIAGNOSIS — E538 Deficiency of other specified B group vitamins: Secondary | ICD-10-CM | POA: Diagnosis not present

## 2024-03-18 DIAGNOSIS — D638 Anemia in other chronic diseases classified elsewhere: Secondary | ICD-10-CM | POA: Diagnosis not present

## 2024-03-18 DIAGNOSIS — D508 Other iron deficiency anemias: Secondary | ICD-10-CM | POA: Insufficient documentation

## 2024-03-18 DIAGNOSIS — D500 Iron deficiency anemia secondary to blood loss (chronic): Secondary | ICD-10-CM

## 2024-03-18 DIAGNOSIS — D649 Anemia, unspecified: Secondary | ICD-10-CM

## 2024-03-18 LAB — CBC WITH DIFFERENTIAL (CANCER CENTER ONLY)
Abs Immature Granulocytes: 0.03 K/uL (ref 0.00–0.07)
Basophils Absolute: 0 K/uL (ref 0.0–0.1)
Basophils Relative: 1 %
Eosinophils Absolute: 0.1 K/uL (ref 0.0–0.5)
Eosinophils Relative: 2 %
HCT: 34.8 % — ABNORMAL LOW (ref 36.0–46.0)
Hemoglobin: 11.5 g/dL — ABNORMAL LOW (ref 12.0–15.0)
Immature Granulocytes: 1 %
Lymphocytes Relative: 27 %
Lymphs Abs: 1.7 K/uL (ref 0.7–4.0)
MCH: 30.1 pg (ref 26.0–34.0)
MCHC: 33 g/dL (ref 30.0–36.0)
MCV: 91.1 fL (ref 80.0–100.0)
Monocytes Absolute: 0.6 K/uL (ref 0.1–1.0)
Monocytes Relative: 9 %
Neutro Abs: 3.8 K/uL (ref 1.7–7.7)
Neutrophils Relative %: 60 %
Platelet Count: 222 K/uL (ref 150–400)
RBC: 3.82 MIL/uL — ABNORMAL LOW (ref 3.87–5.11)
RDW: 15.1 % (ref 11.5–15.5)
WBC Count: 6.3 K/uL (ref 4.0–10.5)
nRBC: 0 % (ref 0.0–0.2)

## 2024-03-18 LAB — SAMPLE TO BLOOD BANK

## 2024-03-18 LAB — FERRITIN: Ferritin: 255 ng/mL (ref 11–307)

## 2024-03-18 LAB — IRON AND IRON BINDING CAPACITY (CC-WL,HP ONLY)
Iron: 61 ug/dL (ref 28–170)
Saturation Ratios: 20 % (ref 10.4–31.8)
TIBC: 301 ug/dL (ref 250–450)
UIBC: 240 ug/dL

## 2024-03-18 LAB — VITAMIN B12: Vitamin B-12: 938 pg/mL — ABNORMAL HIGH (ref 180–914)

## 2024-03-18 MED ORDER — CYANOCOBALAMIN 1000 MCG/ML IJ SOLN
1000.0000 ug | Freq: Once | INTRAMUSCULAR | Status: AC
  Administered 2024-03-18: 1000 ug via INTRAMUSCULAR
  Filled 2024-03-18: qty 1

## 2024-03-18 NOTE — Progress Notes (Signed)
 " [facility]   [phone] Fax:([phone]   Clinic Follow up Note   Patient Care Team: Arloa Jarvis, NP as PCP - General (Nurse Practitioner) Court Dorn PARAS, MD as PCP - Cardiology (Cardiology) Shari Sieving, MD as Consulting Physician (Orthopedic Surgery) Lanny Callander, MD as Consulting Physician (Hematology) Burton, Lacie K, NP as Nurse Practitioner (Nurse Practitioner) Claudene Prentice DELENA Mickey., FNP as Nurse Practitioner (Family Medicine) Joshua Alm Hamilton, MD as Consulting Physician (Neurosurgery)  Date of Service:  03/18/2024  CHIEF COMPLAINT: f/u of anemia and B12 deficiency  CURRENT THERAPY:  B12 injection monthly  Oncology History   B12 deficiency -previous lab showed low B12 and elevated MMA which confirms B12 deficiency.   -intrinsic factor from 08/31/21 was normal  -she began B12 injections on 08/31/21, weekly through 10/11/21 and now on monthly. Last dose in 12/2021 -She had a multiple hospital admission since January 2024, and missed her B12 injections, her anemia got worse, but improved again after resume B12 injections   Anemia of chronic disease -iron  panel 08/01/21 indicative of anemia of chronic disease rather than true iron  deficiency  --iron  on 12/06/21 was 19. She received 3 doses IV Venofer  from 12/22/21.  She responded well, hemoglobin improved to 11.8 after IV iron . -she received iv iron  in 11/2023  Assessment & Plan Vitamin B12 deficiency anemia Vitamin B12 deficiency anemia is well-controlled with monthly B12 injections. Hemoglobin remains mildly decreased but has improved compared to prior severe anemia, indicating a good therapeutic response. - Administered monthly B12 injection. - Ordered serum B12 level (pending). - Scheduled ongoing monthly B12 injections. - Instructed her to confirm scheduling prior to departure.  Anemia of chronic disease Anemia of chronic disease previously severe and requiring hospitalization is now  improved, with hemoglobin stable and iron  studies normal. Ongoing risk factors include chronic infection and recent surgeries. She remains under surveillance due to her risk profile. - Checked iron  studies (normal). - Advised follow-up with infectious disease specialist for management of chronic infection. - Recommended removal of Bruvane as transfusion is not indicated. - Scheduled laboratory monitoring every two months. - Planned to extend laboratory monitoring intervals if stability is maintained at six-month follow-up.   Plan - Lab reviewed, hemoglobin 11.5, stable, will proceed to B12 injection and continue monthly - Lab every 2 months and follow-up in 6 months    Discussed the use of AI scribe software for clinical note transcription with the patient, who gave verbal consent to proceed.  History of Present Illness April Shaffer is a 74 year old female with chronic anemia and vitamin B12 deficiency who presents for hematology follow-up.  She notes mild morning fatigue but feels her overall energy is acceptable. She denies other new or worsening symptoms.  Her anemia was severe with a marked hemoglobin drop in September 2025 that required hospitalization, but hemoglobin has since improved and stabilized, with recent values of 11.5 and 11.7 g/dL. She receives monthly vitamin B12 injections, with doses in November and December 2025 and another planned today. Iron  was low in July 2025 but is now normal. She has not needed recent blood transfusions.  She had a hip replacement in May 2025, complicated by infection and stent malfunction that required revision. She is followed by infectious disease and takes Crexont 200 mg daily for infection prophylaxis. She connects prior weight loss to the infection.     All other systems were reviewed with the patient and are negative.  MEDICAL HISTORY:  Past Medical History:  Diagnosis Date   Anemia    Arthritis    Blood transfusion without  reported diagnosis    Carotid artery narrowing    Coronary artery disease    Cardiac catheterization November 2013: 50% ostial LAD stenosis 50% mid stenosis. 30% disease in the left circumflex.   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   Sleep apnea       Had surgery to correct   Thyroid  nodule     SURGICAL HISTORY: Past Surgical History:  Procedure Laterality Date   ABDOMINAL AORTOGRAM N/A 11/04/2023   Procedure: ABDOMINAL AORTOGRAM;  Surgeon: Sheree Penne Bruckner, MD;  Location: [facility] INVASIVE CV LAB;  Service: Cardiovascular;  Laterality: N/A;   ABDOMINAL AORTOGRAM W/LOWER EXTREMITY N/A 08/07/2021   Procedure: ABDOMINAL AORTOGRAM W/LOWER EXTREMITY;  Surgeon: Sheree Penne Bruckner, MD;  Location: [facility] INVASIVE CV LAB;  Service: Cardiovascular;  Laterality: N/A;   ABDOMINAL AORTOGRAM W/LOWER EXTREMITY Right 10/23/2021   Procedure: ABDOMINAL AORTOGRAM W/LOWER EXTREMITY;  Surgeon: Sheree Penne Bruckner, MD;  Location: [facility] INVASIVE CV LAB;  Service: Cardiovascular;  Laterality: Right;   ABDOMINAL HYSTERECTOMY     APPLICATION OF INTRAOPERATIVE CT SCAN N/A 11/30/2020   Procedure: APPLICATION OF INTRAOPERATIVE CT SCAN;  Surgeon: Joshua Alm RAMAN, MD;  Location: [facility] OR;  Service: Neurosurgery;  Laterality: N/A;   BACK SURGERY     CARDIAC CATHETERIZATION     CATARACT EXTRACTION, BILATERAL Bilateral 2021   Dr. Milissa   ENDARTERECTOMY  09/27/2011   Procedure: RIGT ENDARTERECTOMY CAROTID;  Surgeon: Lynwood JONETTA Collum, MD;  Location: [facility] OR;  Service: Vascular;  Laterality: Right;   EPIDURAL BLOCK INJECTION  02/2008   Drs. Clydell Mince   EYE SURGERY Bilateral 2021   cataract   gyn surgery  2004   total hysterectomy for mennorhagia,,salpingoophorectomy   INCISION AND DRAINAGE ABSCESS Left 05/08/2022   Procedure: ARTHROSCOPIC INCISION AND DRAINAGE  ABSCESS, DISTAL CLAVICLE EXCISSION, SUBACROMIAL DECOMPRESSION, LOOSE BODY REMOVAL, EXTENSIVE DEBRIDEMENT;  Surgeon: Beverley Evalene JONETTA, MD;  Location: [facility] ORS;  Service: Orthopedics;  Laterality: Left;   IR RADIOLOGIST EVAL & MGMT  10/09/2023   LAMINECTOMY WITH POSTERIOR LATERAL ARTHRODESIS LEVEL 2 N/A 11/30/2020   Procedure: LUMBAR FOUR-FIVE, LUMBAR FIVE-SACRAL ONE POSTERIOR LATERAL FUSION WITH REVISION OF LUMBAR ONE-FIVE HARDWARE AND EXTENSION TO SACRAL ONE AND SACRAL TWO;  Surgeon: Joshua Alm RAMAN, MD;  Location: [facility] OR;  Service: Neurosurgery;  Laterality: N/A;   LOWER EXTREMITY ANGIOGRAPHY N/A 11/04/2023   Procedure: Lower Extremity Angiography;  Surgeon: Sheree Penne Bruckner, MD;  Location: [facility] INVASIVE CV LAB;  Service: Cardiovascular;  Laterality: N/A;   LOWER EXTREMITY INTERVENTION Right 11/04/2023   Procedure: LOWER EXTREMITY INTERVENTION;  Surgeon: Sheree Penne Bruckner, MD;  Location: [facility] INVASIVE CV LAB;  Service: Cardiovascular;  Laterality: Right;   LUMBAR WOUND DEBRIDEMENT N/A 05/25/2020   Procedure: LUMBAR WOUND IRRIGATION AND DEBRIDEMENT;  Surgeon: Joshua Alm RAMAN, MD;  Location: [facility] OR;  Service: Neurosurgery;  Laterality: N/A;   PERIPHERAL VASCULAR INTERVENTION  08/07/2021   Procedure: PERIPHERAL VASCULAR INTERVENTION;  Surgeon: Sheree Penne Bruckner, MD;  Location: [facility] INVASIVE CV LAB;  Service: Cardiovascular;;  Rt SFA   PERIPHERAL VASCULAR INTERVENTION Right 10/23/2021   Procedure: PERIPHERAL VASCULAR INTERVENTION;  Surgeon: Sheree Penne Bruckner, MD;  Location: [facility] INVASIVE CV LAB;  Service: Cardiovascular;  Laterality: Right;  SFA   POSTERIOR LUMBAR FUSION  4 LEVEL N/A 04/25/2020   Procedure: POSTERIOR LUMBAR INTERBODY FUSION LUMBAR ONE-TWO, LUMBAR TWO-THREE, LUMBAR THREE-FOUR,LUMBAR FOUR-FIVE.;  Surgeon: Joshua Alm RAMAN, MD;  Location: [facility] OR;  Service: Neurosurgery;  Laterality: N/A;  posterior   SHOULDER ARTHROSCOPY Left 05/08/2022   Procedure: ARTHROSCOPY SHOULDER;   Surgeon: Beverley Evalene BIRCH, MD;  Location: [facility];  Service: Orthopedics;  Laterality: Left;   TONSILLECTOMY     TOTAL ABDOMINAL HYSTERECTOMY W/ BILATERAL SALPINGOOPHORECTOMY Bilateral 2000   TOTAL HIP ARTHROPLASTY Right 08/22/2018   Procedure: TOTAL HIP ARTHROPLASTY;  Surgeon: Shari Sieving, MD;  Location: [facility] ORS;  Service: Orthopedics;  Laterality: Right;   TOTAL HIP ARTHROPLASTY Left 07/16/2023   Procedure: ARTHROPLASTY, HIP, TOTAL, ANTERIOR APPROACH;  Surgeon: Beverley Evalene BIRCH, MD;  Location: [facility];  Service: Orthopedics;  Laterality: Left;   TRANSCAROTID ARTERY REVASCULARIZATION  Left 08/07/2022   Procedure: Transcarotid Artery Revascularization;  Surgeon: Sheree Penne Bruckner, MD;  Location: [facility] OR;  Service: Vascular;  Laterality: Left;   ULTRASOUND GUIDANCE FOR VASCULAR ACCESS Left 08/07/2022   Procedure: ULTRASOUND GUIDANCE FOR VASCULAR ACCESS;  Surgeon: Sheree Penne Bruckner, MD;  Location: [facility] OR;  Service: Vascular;  Laterality: Left;   VARICOSE VEIN SURGERY  2008   stripping    I have reviewed the social history and family history with the patient and they are unchanged from previous note.  ALLERGIES:  is allergic to zestoretic  [lisinopril -hydrochlorothiazide ], codeine, oxycodone , ultram  [tramadol ], and zocor  [simvastatin ].  MEDICATIONS:  Current Outpatient Medications  Medication Sig Dispense Refill   acetaminophen  (TYLENOL ) 500 MG tablet Take 500 mg by mouth every 6 (six) hours as needed for mild pain (pain score 1-3), moderate pain (pain score 4-6) or headache.     amLODipine  (NORVASC ) 10 MG tablet Take 10 mg by mouth in the morning.     aspirin  EC 81 MG tablet Take 1 tablet (81 mg total) by mouth 2 (two) times daily. To prevent blood clots for 30 days after surgery. (Patient taking differently: Take 81 mg by mouth daily.) 60 tablet 0   carvedilol  (COREG  CR) 10 MG 24 hr capsule Take 10 mg by mouth in the morning.     clopidogrel  (PLAVIX ) 75 MG tablet Take 75 mg by  mouth in the morning.     cyanocobalamin  (VITAMIN B12) 1000 MCG/ML injection Inject 1,000 mcg into the muscle every 30 (thirty) days.     ezetimibe  (ZETIA ) 10 MG tablet TAKE 1 TABLET EVERY DAY (NEED MD APPOINTMENT) (Patient taking differently: Take 10 mg by mouth daily.) 90 tablet 0   ferrous sulfate  325 (65 FE) MG tablet Take 325 mg by mouth 3 (three) times a week.     fluconazole  (DIFLUCAN ) 200 MG tablet Take 1 tablet and may repeat in 3 days if still having vaginal itching 2 tablet 1   gabapentin  (NEURONTIN ) 300 MG capsule Take 300 mg by mouth in the morning, at noon, and at bedtime.     glucose blood test strip Check sugars twice daily 100 each 11   hydrochlorothiazide  (HYDRODIURIL ) 12.5 MG tablet Take 12.5 mg by mouth in the morning.     metFORMIN  (GLUCOPHAGE -XR) 750 MG 24 hr tablet Take 750 mg by mouth in the morning.     oxyCODONE -acetaminophen  (PERCOCET) 10-325 MG tablet Take 1 tablet by mouth every 4 (four) hours as needed for pain. 40 tablet 0   sodium chloride  flush 0.9 % SOLN injection Flush with 5-10mL of saline daily to prevent clogging 200 mL 0   Tedizolid Phosphate  (SIVEXTRO ) 200  MG TABS Take 1 tablet by mouth once daily 30 tablet 2   No current facility-administered medications for this visit.    PHYSICAL EXAMINATION: ECOG PERFORMANCE STATUS: 1 - Symptomatic but completely ambulatory  Vitals:   03/18/24 1200 03/18/24 1210  BP: (!) 150/80 (!) 142/62  Pulse: 69   Resp: 19   Temp: 99.7 F (37.6 C)   SpO2: 99%    Wt Readings from Last 3 Encounters:  03/18/24 124 lb 6.4 oz (56.4 kg)  12/18/23 125 lb 8 oz (56.9 kg)  12/13/23 120 lb 12.8 oz (54.8 kg)     GENERAL:alert, no distress and comfortable SKIN: skin color, texture, turgor are normal, no rashes or significant lesions Musculoskeletal:no cyanosis of digits and no clubbing  NEURO: alert & oriented x 3 with fluent speech, no focal motor/sensory deficits  Physical Exam    LABORATORY DATA:  I have reviewed the  data as listed    Latest Ref Rng & Units 03/18/2024   11:08 AM 02/13/2024    1:16 PM 01/24/2024   10:02 AM  CBC  WBC 4.0 - 10.5 K/uL 6.3  7.4  8.0   Hemoglobin 12.0 - 15.0 g/dL 88.4  88.2  88.0   Hematocrit 36.0 - 46.0 % 34.8  35.8  36.4   Platelets 150 - 400 K/uL 222  260  229         Latest Ref Rng & Units 12/10/2023   11:35 AM 11/14/2023   11:40 AM 11/04/2023   10:34 AM  CMP  Glucose 65 - 99 mg/dL 885  875  79   BUN 7 - 25 mg/dL 18  13  21    Creatinine 0.60 - 1.00 mg/dL 8.96  8.90  8.59   Sodium 135 - 146 mmol/L 138  133  137   Potassium 3.5 - 5.3 mmol/L 3.5  3.6  3.8   Chloride 98 - 110 mmol/L 103  99  102   CO2 20 - 32 mmol/L 27  27    Calcium  8.6 - 10.4 mg/dL 9.0  9.1    Total Protein 6.1 - 8.1 g/dL 8.7  8.8    Total Bilirubin 0.2 - 1.2 mg/dL 0.4  0.6    AST 10 - 35 U/L 12  12    ALT 6 - 29 U/L 3  4        RADIOGRAPHIC STUDIES: I have personally reviewed the radiological images as listed and agreed with the findings in the report. No results found.    No orders of the defined types were placed in this encounter.  All questions were answered. The patient knows to call the clinic with any problems, questions or concerns. No barriers to learning was detected. The total time spent in the appointment was 25 minutes, including review of chart and various tests results, discussions about plan of care and coordination of care plan     Onita Mattock, MD 03/18/2024     "

## 2024-03-21 ENCOUNTER — Encounter: Payer: Self-pay | Admitting: Nurse Practitioner

## 2024-03-24 ENCOUNTER — Ambulatory Visit: Admitting: Internal Medicine

## 2024-03-31 ENCOUNTER — Telehealth: Payer: Self-pay

## 2024-03-31 ENCOUNTER — Other Ambulatory Visit: Payer: Self-pay | Admitting: Internal Medicine

## 2024-03-31 ENCOUNTER — Other Ambulatory Visit (HOSPITAL_COMMUNITY): Payer: Self-pay

## 2024-03-31 ENCOUNTER — Telehealth: Payer: Self-pay | Admitting: Internal Medicine

## 2024-03-31 DIAGNOSIS — M4620 Osteomyelitis of vertebra, site unspecified: Secondary | ICD-10-CM

## 2024-03-31 DIAGNOSIS — T847XXD Infection and inflammatory reaction due to other internal orthopedic prosthetic devices, implants and grafts, subsequent encounter: Secondary | ICD-10-CM

## 2024-03-31 MED ORDER — TEDIZOLID PHOSPHATE 200 MG PO TABS: 1.0000 | ORAL_TABLET | Freq: Every day | ORAL | 11 refills | Status: DC

## 2024-03-31 MED ORDER — SULFAMETHOXAZOLE-TRIMETHOPRIM 800-160 MG PO TABS: 1.0000 | ORAL_TABLET | Freq: Two times a day (BID) | ORAL | 5 refills | Status: AC

## 2024-03-31 NOTE — Progress Notes (Signed)
 Tedezolid is cost prohibitive  Plan: Bactrim  ds 1 tablet po bid for 30 days Then once a day there after   2 weeks f/u either with provider or pharmacy for lab monitoring

## 2024-03-31 NOTE — Telephone Encounter (Signed)
-----   Message from Constance Passer, MD sent at 03/31/2024  1:42 PM EST ----- Hi all  [place] just notified us  that the copay for tedezolid is 14k (a year I presume)  Why was she able to get tedezolid up to this point? Was it the same copay    But I guess if it is what it is, if the cost is prohibitive   Let's try her on bactrim  1 ds tablet bid for a month, and if she does ok with that then we can either keep going with that or do 1 ds tablet once a day after that month   I'm going to go ahead and put in the bactrim    [facility] team could you get her to see us  in 2 weeks after starting bactrim  to check labs. If I have no slot then perhaps pharmacy have a lab check slot to place her?   Let's stop tedezolid then when she gets bactrim    Thanks all

## 2024-03-31 NOTE — Telephone Encounter (Signed)
 Pharmacy Patient Advocate Encounter  Received notification from [employer] that Prior Authorization for SIVEXTRO   has been APPROVED from 02/27/24 to 02/25/25. Ran test claim, Copay is $3475.66. This test claim was processed through [facility]- copay amounts may vary at other pharmacies due to pharmacy/plan contracts, or as the patient moves through the different stages of their insurance plan.   PA #/Case ID/Reference #: [id number]

## 2024-03-31 NOTE — Telephone Encounter (Signed)
Left VM asking patient to return my call.   Biagio Snelson P Ona Rathert, CMA  

## 2024-03-31 NOTE — Telephone Encounter (Signed)
 April Shaffer called to reschedule her appt due to the weather. She requested a refill to get to her next appt and declined seeing another provider. She stated she may need to reschedule again depending if she can get out of [place] neighborhood. Pt is scheduled 2/20 and can be reached at [phone].

## 2024-04-01 ENCOUNTER — Other Ambulatory Visit (HOSPITAL_COMMUNITY): Payer: Self-pay

## 2024-04-01 ENCOUNTER — Ambulatory Visit: Admitting: Internal Medicine

## 2024-04-01 NOTE — Telephone Encounter (Signed)
 I spoke to the patient and she stated she spoke with the pharmacy and there is no charge for the Sivextro . She will continue taking it. If she has any picking it up she will call. Patient states she rather not take Bactrim . Added to allergy list

## 2024-04-01 NOTE — Telephone Encounter (Signed)
 Sounds good Dr. Overton. Triage team - Dr. Overton already sent the Bactrim  prescription in. Tiffany LVM with patient to review information. May try to reach out to them again today. Thank you!

## 2024-04-01 NOTE — Telephone Encounter (Signed)
 Oh interesting - it seems Dr. Efrain had her on it for a while, and her main symptom was nausea which was tolerable with Zofran . I wonder if she would be willing to try it again.

## 2024-04-15 ENCOUNTER — Inpatient Hospital Stay: Attending: Nurse Practitioner

## 2024-04-17 ENCOUNTER — Ambulatory Visit: Admitting: Internal Medicine

## 2024-05-13 ENCOUNTER — Inpatient Hospital Stay

## 2024-05-13 ENCOUNTER — Inpatient Hospital Stay: Attending: Nurse Practitioner

## 2024-05-19 ENCOUNTER — Ambulatory Visit: Admitting: Podiatry

## 2024-06-10 ENCOUNTER — Ambulatory Visit (HOSPITAL_COMMUNITY)

## 2024-06-10 ENCOUNTER — Ambulatory Visit

## 2024-06-17 ENCOUNTER — Inpatient Hospital Stay: Attending: Nurse Practitioner

## 2024-07-15 ENCOUNTER — Inpatient Hospital Stay

## 2024-07-15 ENCOUNTER — Inpatient Hospital Stay: Attending: Nurse Practitioner

## 2024-08-19 ENCOUNTER — Inpatient Hospital Stay: Attending: Nurse Practitioner

## 2024-09-23 ENCOUNTER — Inpatient Hospital Stay: Admitting: Hematology

## 2024-09-23 ENCOUNTER — Inpatient Hospital Stay

## 2024-09-23 ENCOUNTER — Inpatient Hospital Stay: Attending: Nurse Practitioner
